# Patient Record
Sex: Female | Born: 1955
Health system: Southern US, Community
[De-identification: ages and names within clinical notes are randomized; demographics above are authoritative.]

## PROBLEM LIST (undated history)

## (undated) DIAGNOSIS — M1990 Unspecified osteoarthritis, unspecified site: Secondary | ICD-10-CM

## (undated) DIAGNOSIS — C799 Secondary malignant neoplasm of unspecified site: Secondary | ICD-10-CM

## (undated) DIAGNOSIS — F419 Anxiety disorder, unspecified: Secondary | ICD-10-CM

## (undated) DIAGNOSIS — C679 Malignant neoplasm of bladder, unspecified: Secondary | ICD-10-CM

## (undated) DIAGNOSIS — C801 Malignant (primary) neoplasm, unspecified: Secondary | ICD-10-CM

## (undated) DIAGNOSIS — S92911A Unspecified fracture of right toe(s), initial encounter for closed fracture: Secondary | ICD-10-CM

## (undated) DIAGNOSIS — Z973 Presence of spectacles and contact lenses: Secondary | ICD-10-CM

## (undated) DIAGNOSIS — T451X5A Adverse effect of antineoplastic and immunosuppressive drugs, initial encounter: Secondary | ICD-10-CM

## (undated) DIAGNOSIS — N183 Chronic kidney disease, stage 3 unspecified: Secondary | ICD-10-CM

## (undated) DIAGNOSIS — L989 Disorder of the skin and subcutaneous tissue, unspecified: Secondary | ICD-10-CM

## (undated) DIAGNOSIS — K5730 Diverticulosis of large intestine without perforation or abscess without bleeding: Secondary | ICD-10-CM

## (undated) DIAGNOSIS — Z8489 Family history of other specified conditions: Secondary | ICD-10-CM

## (undated) DIAGNOSIS — J302 Other seasonal allergic rhinitis: Secondary | ICD-10-CM

## (undated) DIAGNOSIS — E559 Vitamin D deficiency, unspecified: Secondary | ICD-10-CM

## (undated) DIAGNOSIS — Z8679 Personal history of other diseases of the circulatory system: Secondary | ICD-10-CM

## (undated) DIAGNOSIS — F329 Major depressive disorder, single episode, unspecified: Secondary | ICD-10-CM

## (undated) DIAGNOSIS — E785 Hyperlipidemia, unspecified: Secondary | ICD-10-CM

## (undated) DIAGNOSIS — R530 Neoplastic (malignant) related fatigue: Secondary | ICD-10-CM

## (undated) DIAGNOSIS — F1020 Alcohol dependence, uncomplicated: Secondary | ICD-10-CM

## (undated) DIAGNOSIS — Z85828 Personal history of other malignant neoplasm of skin: Secondary | ICD-10-CM

## (undated) DIAGNOSIS — Z86010 Personal history of colonic polyps: Secondary | ICD-10-CM

## (undated) DIAGNOSIS — N1330 Unspecified hydronephrosis: Secondary | ICD-10-CM

## (undated) DIAGNOSIS — I10 Essential (primary) hypertension: Secondary | ICD-10-CM

## (undated) DIAGNOSIS — Z5111 Encounter for antineoplastic chemotherapy: Secondary | ICD-10-CM

## (undated) DIAGNOSIS — K219 Gastro-esophageal reflux disease without esophagitis: Secondary | ICD-10-CM

## (undated) DIAGNOSIS — C4490 Unspecified malignant neoplasm of skin, unspecified: Secondary | ICD-10-CM

## (undated) DIAGNOSIS — Z720 Tobacco use: Secondary | ICD-10-CM

## (undated) DIAGNOSIS — Z8619 Personal history of other infectious and parasitic diseases: Secondary | ICD-10-CM

## (undated) DIAGNOSIS — J189 Pneumonia, unspecified organism: Secondary | ICD-10-CM

## (undated) DIAGNOSIS — Z515 Encounter for palliative care: Secondary | ICD-10-CM

## (undated) DIAGNOSIS — Z87442 Personal history of urinary calculi: Secondary | ICD-10-CM

## (undated) DIAGNOSIS — C669 Malignant neoplasm of unspecified ureter: Secondary | ICD-10-CM

## (undated) DIAGNOSIS — Z9221 Personal history of antineoplastic chemotherapy: Secondary | ICD-10-CM

## (undated) DIAGNOSIS — F411 Generalized anxiety disorder: Secondary | ICD-10-CM

## (undated) DIAGNOSIS — D6481 Anemia due to antineoplastic chemotherapy: Secondary | ICD-10-CM

## (undated) DIAGNOSIS — Z860101 Personal history of adenomatous and serrated colon polyps: Secondary | ICD-10-CM

## (undated) HISTORY — DX: Major depressive disorder, single episode, unspecified: F32.9

## (undated) HISTORY — PX: TONSILLECTOMY: SUR1361

## (undated) HISTORY — DX: Alcohol dependence, uncomplicated: F10.20

## (undated) HISTORY — DX: Personal history of other diseases of the circulatory system: Z86.79

## (undated) HISTORY — DX: Essential (primary) hypertension: I10

## (undated) HISTORY — DX: Vitamin D deficiency, unspecified: E55.9

## (undated) HISTORY — PX: FINGER SURGERY: SHX640

## (undated) HISTORY — DX: Unspecified fracture of right toe(s), initial encounter for closed fracture: S92.911A

## (undated) HISTORY — DX: Personal history of other infectious and parasitic diseases: Z86.19

## (undated) HISTORY — DX: Hyperlipidemia, unspecified: E78.5

## (undated) HISTORY — PX: GYNECOLOGIC CRYOSURGERY: SHX857

## (undated) HISTORY — DX: Tobacco use: Z72.0

## (undated) HISTORY — DX: Unspecified malignant neoplasm of skin, unspecified: C44.90

---

## 2009-10-29 ENCOUNTER — Encounter: Admission: RE | Admit: 2009-10-29 | Discharge: 2009-10-29 | Payer: Self-pay | Admitting: Internal Medicine

## 2010-02-19 ENCOUNTER — Ambulatory Visit (HOSPITAL_COMMUNITY): Admission: RE | Admit: 2010-02-19 | Discharge: 2010-02-19 | Payer: Self-pay | Admitting: Gastroenterology

## 2012-04-29 LAB — HM HEPATITIS C SCREENING LAB: HM Hepatitis Screen: NEGATIVE

## 2012-06-30 DIAGNOSIS — Z8619 Personal history of other infectious and parasitic diseases: Secondary | ICD-10-CM

## 2012-06-30 DIAGNOSIS — Z8679 Personal history of other diseases of the circulatory system: Secondary | ICD-10-CM

## 2012-06-30 DIAGNOSIS — G4700 Insomnia, unspecified: Secondary | ICD-10-CM | POA: Insufficient documentation

## 2012-06-30 HISTORY — DX: Personal history of other infectious and parasitic diseases: Z86.19

## 2012-06-30 HISTORY — DX: Personal history of other diseases of the circulatory system: Z86.79

## 2013-02-07 LAB — CBC AND DIFFERENTIAL: WBC: 5.4 10^3/mL

## 2013-02-07 LAB — BASIC METABOLIC PANEL
Glucose: 87 mg/dL
Potassium: 4 mmol/L (ref 3.4–5.3)

## 2013-02-07 LAB — LIPID PANEL: Triglycerides: 97 mg/dL (ref 40–160)

## 2013-07-17 ENCOUNTER — Telehealth: Payer: Self-pay | Admitting: Family Medicine

## 2013-07-17 ENCOUNTER — Ambulatory Visit (INDEPENDENT_AMBULATORY_CARE_PROVIDER_SITE_OTHER): Payer: 59 | Admitting: Family Medicine

## 2013-07-17 ENCOUNTER — Encounter: Payer: Self-pay | Admitting: Family Medicine

## 2013-07-17 ENCOUNTER — Encounter: Payer: Self-pay | Admitting: *Deleted

## 2013-07-17 VITALS — BP 138/91 | HR 78 | Ht 66.0 in | Wt 157.0 lb

## 2013-07-17 DIAGNOSIS — Z8619 Personal history of other infectious and parasitic diseases: Secondary | ICD-10-CM

## 2013-07-17 DIAGNOSIS — E785 Hyperlipidemia, unspecified: Secondary | ICD-10-CM

## 2013-07-17 DIAGNOSIS — Z131 Encounter for screening for diabetes mellitus: Secondary | ICD-10-CM

## 2013-07-17 DIAGNOSIS — F1021 Alcohol dependence, in remission: Secondary | ICD-10-CM

## 2013-07-17 DIAGNOSIS — F418 Other specified anxiety disorders: Secondary | ICD-10-CM | POA: Insufficient documentation

## 2013-07-17 DIAGNOSIS — R030 Elevated blood-pressure reading, without diagnosis of hypertension: Secondary | ICD-10-CM

## 2013-07-17 DIAGNOSIS — E559 Vitamin D deficiency, unspecified: Secondary | ICD-10-CM | POA: Insufficient documentation

## 2013-07-17 DIAGNOSIS — IMO0001 Reserved for inherently not codable concepts without codable children: Secondary | ICD-10-CM

## 2013-07-17 DIAGNOSIS — M5416 Radiculopathy, lumbar region: Secondary | ICD-10-CM | POA: Insufficient documentation

## 2013-07-17 DIAGNOSIS — F329 Major depressive disorder, single episode, unspecified: Secondary | ICD-10-CM

## 2013-07-17 DIAGNOSIS — F32A Depression, unspecified: Secondary | ICD-10-CM

## 2013-07-17 HISTORY — DX: Alcohol dependence, in remission: F10.21

## 2013-07-17 HISTORY — DX: Hyperlipidemia, unspecified: E78.5

## 2013-07-17 HISTORY — DX: Personal history of other infectious and parasitic diseases: Z86.19

## 2013-07-17 HISTORY — DX: Vitamin D deficiency, unspecified: E55.9

## 2013-07-17 HISTORY — DX: Depression, unspecified: F32.A

## 2013-07-17 NOTE — Progress Notes (Signed)
CC: Mary Stark is a 57 y.o. 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 Symptoms significantly improved once having Abilify to Wellbutrin. She reports mild subjective depression but does not get in the way of her quality of life nothing seems to make it better or worse she tells me she is quite happy with her psychiatric state currently. She has been dealing with insomnia on a nightly basis ever since starting third shift this is been helped with using a single Klonopin every evening she rarely if ever uses them otherwise.    History hyperlipidemia has been on Zocor for at least a year now last cholesterol panel was in February without abnormalities. She denies right upper quadrant pain, myalgias nor skin or scleral discoloration. She's particular about what she eats and eats quite healthy, no formal exercise routine  History of hepatitis C early 2000 approximately one year ago RNA was undetectable she is under the impression that she is no longer needing to have RNA levels checked.   She has a history of requiring inpatient treatment for alcoholism a little over a year ago since then she reports she drinks no more than 2 drinks every day and no more than 14 in a week.    Review Of Systems Outlined In HPI  Past Medical History  Diagnosis Date  . Depression 07/17/2013  . History of hepatitis C 07/17/2013  . Hyperlipidemia 07/17/2013  . Vitamin D deficiency 07/17/2013     History reviewed. No pertinent family history.   History  Substance Use Topics  . Smoking status: Not on file  . Smokeless tobacco: Not on file  . Alcohol Use: Not on file     Objective: Filed Vitals:   07/17/13 1424  BP: 138/91  Pulse: 78    General: Alert and  Oriented, No Acute Distress HEENT: Pupils equal, round, reactive to light. Conjunctivae clear.  External ears unremarkable, canals clear with intact TMs with appropriate landmarks.  Middle ear appears open without effusion. Pink inferior turbinates.  Moist mucous membranes, pharynx without inflammation nor lesions.  Neck supple without palpable lymphadenopathy nor abnormal masses. Lungs: Clear to auscultation bilaterally, no wheezing/ronchi/rales.  Comfortable work of breathing. Good air movement. Cardiac: Regular rate and rhythm. Normal S1/S2.  No murmurs, rubs, nor gallops.   Abdomen: Normal bowel sounds, soft and non tender without palpable masses. Extremities: No peripheral edema.  Strong peripheral pulses.  Mental Status: No depression, anxiety, nor agitation. Skin: Warm and dry.  Assessment & Plan: Mary Stark was seen today for establish care.  Diagnoses and associated orders for this visit:  Hyperlipidemia - Lipid panel  Depression  History of hepatitis C - COMPLETE METABOLIC PANEL WITH GFR  Diabetes mellitus screening - COMPLETE METABOLIC PANEL WITH GFR  Elevated blood pressure  History of alcohol dependence  Other Orders - ARIPiprazole (ABILIFY) 10 MG tablet; Take 10 mg by mouth daily. Take 1/2 a tablet daily - simvastatin (ZOCOR) 20 MG tablet; Take 20 mg by mouth every evening. - clonazePAM (KLONOPIN) 0.5 MG tablet; Take 0.5 mg by mouth. Take 1/2 tablet three times a day - BUPROPION HCL PO; Take 150 mg by mouth. Take three tablets by mouth once a day    Hyperlipidemia: Clinically controlled she is  due for fasting cholesterol we will also check liver enzymes and statin use and history of hepatitis C History of alcohol dependence: Controlled, I've encouraged her to attempt to stop alcohol altogether to the potential of relapse Depression: Chronic controlled condition continue Abilify Wellbutrin History of hepatitis C: Controlled Elevated blood pressure: Discussed diet and  exercise interventions to minimize the need for antihypertensive medications, will discuss medication options if remains elevated at followup   Return in about 3 months (around 10/17/2013).

## 2013-07-17 NOTE — Telephone Encounter (Signed)
Sue Lush, Can you please see if [facility] in  can send [place] a colonoscopy report for [facility] sometime within the last 3-5 years.

## 2013-07-18 ENCOUNTER — Encounter: Payer: Self-pay | Admitting: Family Medicine

## 2013-07-18 DIAGNOSIS — Z860100 Personal history of colon polyps, unspecified: Secondary | ICD-10-CM

## 2013-07-18 HISTORY — DX: Personal history of colon polyps, unspecified: Z86.0100

## 2013-07-18 NOTE — Telephone Encounter (Signed)
They are faxing the report

## 2013-08-02 ENCOUNTER — Encounter: Payer: Self-pay | Admitting: *Deleted

## 2013-08-31 LAB — COMPLETE METABOLIC PANEL WITH GFR
ALT: 12 U/L (ref 0–35)
CO2: 27 mEq/L (ref 19–32)
GFR, Est African American: 84 mL/min
Potassium: 4 mEq/L (ref 3.5–5.3)
Sodium: 136 mEq/L (ref 135–145)
Total Bilirubin: 0.4 mg/dL (ref 0.3–1.2)
Total Protein: 7 g/dL (ref 6.0–8.3)

## 2013-08-31 LAB — LIPID PANEL
HDL: 62 mg/dL (ref >39)
LDL Cholesterol: 78 mg/dL (ref 0–99)

## 2013-09-12 ENCOUNTER — Encounter: Payer: Self-pay | Admitting: Obstetrics & Gynecology

## 2013-09-12 ENCOUNTER — Ambulatory Visit (INDEPENDENT_AMBULATORY_CARE_PROVIDER_SITE_OTHER): Payer: 59 | Admitting: Obstetrics & Gynecology

## 2013-09-12 ENCOUNTER — Ambulatory Visit: Payer: 59 | Admitting: Family Medicine

## 2013-09-12 VITALS — BP 132/91 | HR 97 | Resp 16 | Ht 66.0 in | Wt 144.0 lb

## 2013-09-12 DIAGNOSIS — Z124 Encounter for screening for malignant neoplasm of cervix: Secondary | ICD-10-CM

## 2013-09-12 DIAGNOSIS — Z113 Encounter for screening for infections with a predominantly sexual mode of transmission: Secondary | ICD-10-CM

## 2013-09-12 DIAGNOSIS — N952 Postmenopausal atrophic vaginitis: Secondary | ICD-10-CM

## 2013-09-12 DIAGNOSIS — Z1151 Encounter for screening for human papillomavirus (HPV): Secondary | ICD-10-CM

## 2013-09-12 DIAGNOSIS — Z01419 Encounter for gynecological examination (general) (routine) without abnormal findings: Secondary | ICD-10-CM

## 2013-09-12 MED ORDER — ESTRADIOL 10 MCG VA TABS: ORAL_TABLET | VAGINAL | Status: DC

## 2013-09-12 NOTE — Progress Notes (Signed)
  Subjective:    Mary Stark is a 57 y.o. female who presents for an annual exam. The patient has complaints of vaginal dryness. The patient is sexually active. GYN screening history: last pap: was normal. The patient wears seatbelts: yes. The patient participates in regular exercise: yes. Has the patient ever been transfused or tattooed?: not asked but pt has been treated for Hep C. The patient reports that there is not domestic violence in her life.   Menstrual History: OB History   Grav Para Term Preterm Abortions TAB SAB Ect Mult Living                 No LMP recorded. Patient is postmenopausal.    The following portions of the patient's history were reviewed and updated as appropriate: allergies, current medications, past family history, past medical history, past social history, past surgical history and problem list.  Review of Systems Pertinent items are noted in HPI.    Objective:      Filed Vitals:   09/12/13 1404  BP: 132/91  Pulse: 97  Resp: 16  Height: 5\' 6"  (1.676 m)  Weight: 144 lb (65.318 kg)   Vitals:  WNL General appearance: alert, cooperative and no distress Head: Normocephalic, without obvious abnormality, atraumatic Eyes: negative Throat: lips, mucosa, and tongue normal; teeth and gums normal Lungs: clear to auscultation bilaterally Breasts: normal appearance, no masses or tenderness, No nipple retraction or dimpling, No nipple discharge or bleeding Heart: regular rate and rhythm Abdomen: soft, non-tender; bowel sounds normal; no masses,  no organomegaly Pelvic: cervix normal in appearance, external genitalia normal, no adnexal masses or tenderness, no bladder tenderness, no cervical motion tenderness, perianal skin: no external genital warts noted, rectovaginal septum normal, urethra without abnormality or discharge, uterus normal size, shape, and consistency and vagina is atrophic with more discharge than expected. Extremities: no edema, redness or  tenderness in the calves or thighs Skin: no lesions or rash Lymph nodes: Axillary adenopathy: none     .    Assessment:    Healthy female exam.  Atrophic vaginitis   Plan:     All questions answered. Breast self exam technique reviewed and patient encouraged to perform self-exam monthly. Chlamydia specimen. Follow up in 1 year.  Bd affirm and gonorrhea mammogram Bone density BRCA testing Vagifem for atrophic vaginitis.  Pt explained very small risk of hyperplasia with vaginal estrogen however, recent studies have shown no increase in estrogen levels in women who take vagifem over other postmenopausal women.

## 2013-09-13 ENCOUNTER — Encounter: Payer: Self-pay | Admitting: Family Medicine

## 2013-09-13 ENCOUNTER — Ambulatory Visit (INDEPENDENT_AMBULATORY_CARE_PROVIDER_SITE_OTHER): Payer: 59 | Admitting: Family Medicine

## 2013-09-13 VITALS — BP 123/88 | HR 88 | Wt 145.0 lb

## 2013-09-13 DIAGNOSIS — M5430 Sciatica, unspecified side: Secondary | ICD-10-CM

## 2013-09-13 DIAGNOSIS — M549 Dorsalgia, unspecified: Secondary | ICD-10-CM

## 2013-09-13 DIAGNOSIS — M5431 Sciatica, right side: Secondary | ICD-10-CM

## 2013-09-13 DIAGNOSIS — G4700 Insomnia, unspecified: Secondary | ICD-10-CM

## 2013-09-13 MED ORDER — PREDNISONE 20 MG PO TABS: ORAL_TABLET | ORAL | Status: AC

## 2013-09-13 MED ORDER — CLONAZEPAM 0.5 MG PO TABS: ORAL_TABLET | ORAL | Status: DC

## 2013-09-13 NOTE — Progress Notes (Signed)
CC: Mary Stark is a 57 y.o. female is here for Back Pain   Subjective: HPI:  Patient complains of back pain that has been present for years that has been worsening over the past 2 months. It is described as a piece of glass stuck in her back in the midline. 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 It is significantly improved on the days that she is not working. It is worse at the end of the day moderate severity on the days that she is working. It is nonradiating she denies recent over exertion or trauma. She denies bowel or bladder incontinence, saddle paresthesia, nor radiation of pain. Denies limb weakness. Pain is completely resolved with forward flexion worse with extension. She's been taking Tylenol and ibuprofen without much benefit occasionally locks without benefit  Over the past month she has had a burning sensation moderate severity traveling down the back of her right leg from the buttock into the foot it is worse and present only when driving long distances is never encountered any other situation. She denies sensory disturbances in the extremities. Nothing particularly makes this better or worse other than above   Review Of Systems Outlined In HPI  Past Medical History  Diagnosis Date  . Depression 07/17/2013  . History of hepatitis C 07/17/2013  . Hyperlipidemia 07/17/2013  . Vitamin D deficiency 07/17/2013     Family History  Problem Relation Age of Onset  . Ovarian cancer Mother      History  Substance Use Topics  . Smoking status: Former Smoker    Quit date: 12/17/2012  . Smokeless tobacco: Not on file  . Alcohol Use: 3.0 oz/week    6 drink(s) per week     Objective: Filed Vitals:   09/13/13 1543  BP: 123/88  Pulse: 88    General: Alert and Oriented, No Acute Distress HEENT: Pupils equal, round, reactive to light. Conjunctivae clear.  Moist mucous membranes Lungs: Clear comfortable work of  breathing Cardiac: Regular rate and rhythm.  Back: Pain is reproduced with palpation of L3 spinous process there is no paraspinal musculature pain to palpation. Pain is reproduced with extension absent with flexion. Negative stork test. Extremities: No peripheral edema.  Strong peripheral pulses.  Right leg: Straight leg raise on the right is positive, FABER/FADIR negative, long roll negative, femoral grind is negative she has full range of motion strength in both legs with L4 and S1 DTRs two over four bilaterally Mental Status: No depression, anxiety, nor agitation. Skin: Warm and dry.  Assessment & Plan: Mary Stark was seen today for back pain.  Diagnoses and associated orders for this visit:  Back pain - predniSONE (DELTASONE) 20 MG tablet; Three tabs daily days 1-3, two tabs daily days 4-6, one tab daily days 7-9, half tab daily days 10-13.  Insomnia - clonazePAM (KLONOPIN) 0.5 MG tablet; Half to full tablet by mouth as needed for sleep.  Sciatica, right     discussed with patient I believe her back pain is coming from arthritic facet joints low suspicion for nerve impingement from disc disease. Will start with prednisone taper if not improved will then get imaging as she is open to the idea of injections if warranted Discussed that her right leg pain is most likely due to sciatica given handout on stretching and rehabilitation for the next 3 weeks on a daily basis She is requesting refills on clonazepam for insomnia which is working  well denies any alcohol, nor recreational drug abuse.  Return if symptoms worsen or fail to improve.

## 2013-09-17 ENCOUNTER — Other Ambulatory Visit: Payer: Self-pay | Admitting: Obstetrics & Gynecology

## 2013-09-17 MED ORDER — METRONIDAZOLE 500 MG PO TABS
500.0000 mg | ORAL_TABLET | Freq: Two times a day (BID) | ORAL | Status: DC
Start: 2013-09-17 — End: 2013-10-21

## 2013-09-18 ENCOUNTER — Telehealth: Payer: Self-pay | Admitting: *Deleted

## 2013-09-18 NOTE — Telephone Encounter (Signed)
LM on cell phone with results and that she did have BV and that Dr Penne Lash sent a RX to [facility]

## 2013-09-18 NOTE — Telephone Encounter (Signed)
Message copied by Granville Lewis on Mon Sep 18, 2013 11:37 AM ------      Message from: Lesly Dukes      Created: Sun Sep 17, 2013  9:18 AM       BV on bd affirm.  Rx for Flagyl sent to pharmacy.  RN to call. ------

## 2013-09-25 ENCOUNTER — Telehealth: Payer: Self-pay | Admitting: *Deleted

## 2013-09-25 NOTE — Telephone Encounter (Signed)
Copy of BRCA results which are normal mailed to pt's home address.

## 2013-09-27 ENCOUNTER — Encounter: Payer: Self-pay | Admitting: Obstetrics & Gynecology

## 2013-09-27 DIAGNOSIS — Z8041 Family history of malignant neoplasm of ovary: Secondary | ICD-10-CM | POA: Insufficient documentation

## 2013-09-27 HISTORY — DX: Family history of malignant neoplasm of ovary: Z80.41

## 2013-10-10 ENCOUNTER — Ambulatory Visit (INDEPENDENT_AMBULATORY_CARE_PROVIDER_SITE_OTHER): Payer: 59

## 2013-10-10 DIAGNOSIS — Z1231 Encounter for screening mammogram for malignant neoplasm of breast: Secondary | ICD-10-CM

## 2013-10-10 DIAGNOSIS — Z01419 Encounter for gynecological examination (general) (routine) without abnormal findings: Secondary | ICD-10-CM

## 2013-10-10 DIAGNOSIS — M899 Disorder of bone, unspecified: Secondary | ICD-10-CM

## 2013-10-19 ENCOUNTER — Other Ambulatory Visit: Payer: Self-pay

## 2013-10-21 ENCOUNTER — Emergency Department
Admission: EM | Admit: 2013-10-21 | Discharge: 2013-10-21 | Disposition: A | Discharge status: Home / Self Care | Payer: 59 | Source: Home / Self Care | Attending: Family Medicine | Admitting: Family Medicine

## 2013-10-21 ENCOUNTER — Emergency Department (INDEPENDENT_AMBULATORY_CARE_PROVIDER_SITE_OTHER): Payer: 59

## 2013-10-21 ENCOUNTER — Encounter: Payer: Self-pay | Admitting: Emergency Medicine

## 2013-10-21 DIAGNOSIS — M546 Pain in thoracic spine: Secondary | ICD-10-CM

## 2013-10-21 DIAGNOSIS — M47814 Spondylosis without myelopathy or radiculopathy, thoracic region: Secondary | ICD-10-CM

## 2013-10-21 DIAGNOSIS — M47817 Spondylosis without myelopathy or radiculopathy, lumbosacral region: Secondary | ICD-10-CM

## 2013-10-21 MED ORDER — DICLOFENAC EPOLAMINE 1.3 % TD PTCH: MEDICATED_PATCH | TRANSDERMAL | Status: DC

## 2013-10-21 MED ORDER — TRAMADOL HCL 50 MG PO TABS: 50.0000 mg | ORAL_TABLET | Freq: Four times a day (QID) | ORAL | Status: DC | PRN

## 2013-10-21 NOTE — ED Notes (Signed)
Patient complains of back pain lasting since last Thursday. Patient has a 56 yr old grandson she lifts and plays with. Pain score is a 5/10.

## 2013-10-21 NOTE — ED Provider Notes (Signed)
CSN: [id number]     Arrival date & time 10/21/13  1425 History   First MD Initiated Contact with Patient 10/21/13 1455     Chief Complaint  Patient presents with  . Back Pain      HPI Comments: Patient reports that her previous episode of low back pain treated one month ago with prednisone has resolved.  Her right side radicular pain has also resolved.  After lifting and playing with her 56 y/o grandson two days ago she has developed lower thoracic midline pain.  This pain is improved when bending forward and worse when standing.  She has not had improvement with BC Powder, Flexeril makes her drowsy.  Patient is a 57 y.o. female presenting with back pain. The history is provided by the patient.  Back Pain Location:  Thoracic spine Quality:  Burning and shooting Radiates to:  Does not radiate Pain severity:  Moderate Pain is:  Worse during the day Onset quality:  Sudden Duration:  2 days Timing:  Intermittent Progression:  Unchanged Chronicity:  New Context comment:  Lifting Relieved by:  Nothing Worsened by:  Standing Ineffective treatments: BC Powder. Associated symptoms: no abdominal pain, no bladder incontinence, no bowel incontinence, no chest pain, no dysuria, no fever, no headaches, no leg pain, no numbness, no paresthesias, no pelvic pain, no perianal numbness, no tingling and no weakness     Past Medical History  Diagnosis Date  . Depression 07/17/2013  . History of hepatitis C 07/17/2013  . Hyperlipidemia 07/17/2013  . Vitamin D deficiency 07/17/2013   Past Surgical History  Procedure Laterality Date  . Cesarean section  1984  . Tonsillectomy     Family History  Problem Relation Age of Onset  . Ovarian cancer Mother   . Bronchitis Father   . Cardiomyopathy Father    History  Substance Use Topics  . Smoking status: Current Every Day Smoker    Last Attempt to Quit: 12/17/2012  . Smokeless tobacco: Not on file  . Alcohol Use: 3.0 oz/week    6 drink(s) per week   OB  History   Grav Para Term Preterm Abortions TAB SAB Ect Mult Living                 Review of Systems  Constitutional: Negative for fever.  Cardiovascular: Negative for chest pain.  Gastrointestinal: Negative for abdominal pain and bowel incontinence.  Genitourinary: Negative for bladder incontinence, dysuria and pelvic pain.  Musculoskeletal: Positive for back pain.  Neurological: Negative for tingling, weakness, numbness, headaches and paresthesias.  All other systems reviewed and are negative.    Allergies  Penicillins  Home Medications   Current Outpatient Rx  Name  Route  Sig  Dispense  Refill  . ARIPiprazole (ABILIFY) 5 MG tablet      5 mg. Take 1 tablet (5 mg total) by mouth daily.         Marland Kitchen buPROPion (WELLBUTRIN XL) 150 MG 24 hr tablet      TAKE 3 TABLETS BY MOUTH ONCE DAILY         . Calcium Carbonate-Vitamin D (CALCIUM 600+D) 600-400 MG-UNIT per tablet   Oral   Take 1 tablet by mouth 2 (two) times daily.         . clonazePAM (KLONOPIN) 0.5 MG tablet      Half to full tablet by mouth as needed for sleep.   30 tablet   1   . Coenzyme Q10 (CO Q 10 PO)  Oral   Take by mouth.         . diphenhydrAMINE (BENADRYL) 25 MG tablet   Oral   Take 25 mg by mouth every 6 (six) hours as needed for itching.         . docusate sodium (COLACE) 250 MG capsule   Oral   Take 250 mg by mouth daily.         Marland Kitchen GLUCOSAMINE HCL PO   Oral   Take by mouth.         . Omega-3 Fatty Acids (FISH OIL) 1200 MG CAPS   Oral   Take by mouth.         . simvastatin (ZOCOR) 20 MG tablet      TAKE 1 TABLET BY MOUTH DAILY         . diclofenac (FLECTOR) 1.3 % PTCH      Apply to back once daily; take off after 12 hours   30 patch   0   . Estradiol 10 MCG TABS vaginal tablet      Insert one tablet per vagina nightly for 2 weeks then twice a week for maintenance   18 tablet   1   . traMADol (ULTRAM) 50 MG tablet   Oral   Take 1 tablet (50 mg total) by mouth  every 6 (six) hours as needed.   20 tablet   0    BP 147/91  Pulse 109  Temp(Src) 97.9 F (36.6 C) (Oral)  Ht 5' 6.25" (1.683 m)  Wt 152 lb (68.947 kg)  BMI 24.34 kg/m2  SpO2 98% Physical Exam  Nursing note and vitals reviewed. Constitutional: She is oriented to person, place, and time. She appears well-developed and well-nourished. No distress.  HENT:  Head: Normocephalic.  Eyes: Conjunctivae are normal. Pupils are equal, round, and reactive to light.  Cardiovascular: Normal heart sounds.   Pulmonary/Chest: Breath sounds normal.  Abdominal: She exhibits no distension. There is no tenderness.  Musculoskeletal:       Thoracic back: She exhibits tenderness and bony tenderness. She exhibits no swelling.       Back:  There is tenderness over the lower thoracic spinous processes as noted on diagram. Patient can heel/toe walk and squat without difficulty. Good forward flexion.  Straight leg raising test is negative.  Sitting knee extension test is negative.  Strength and sensation in the lower extremities is normal.  Patellar and achilles reflexes are normal   Neurological: She is alert and oriented to person, place, and time.  Skin: Skin is warm and dry. No rash noted.    ED Course  Procedures  none    Imaging Review Dg Thoracic Spine 2 View  10/21/2013   CLINICAL DATA:  Mid back pain  EXAM: THORACIC SPINE - 2 VIEW  COMPARISON:  None.  FINDINGS: Normal thoracic kyphosis.  No evidence of fracture dislocation. Vertebral body heights and intervertebral disc spaces are maintained.  Very mild degenerative changes.  Visualized lungs are clear.  IMPRESSION: Mild degenerative changes.   Electronically Signed   By: Charline Bills M.D.   On: 10/21/2013 15:45   Dg Lumbar Spine Complete  10/21/2013   CLINICAL DATA:  Back pain for 2 days, no known injury  EXAM: LUMBAR SPINE - COMPLETE 4+ VIEW  COMPARISON:  None.  FINDINGS: Five views of lumbar spine submitted. No acute fracture or  subluxation. Alignment and vertebral heights are preserved. There is disc space flattening with mild anterior spurring at L4-L5 and  L5-S1 level. Facet degenerative changes noted L4 and L5 level.  IMPRESSION: No acute fracture or subluxation. Degenerative changes as described above.   Electronically Signed   By: Natasha Mead M.D.   On: 10/21/2013 15:49      MDM   1. Acute thoracic back pain    Begin trial of Flector patch for 12 hours daily.  Rx for Tramadol.  Continue Flexeril at bedtime. Apply ice pack for 30 minutes, 2 or 3 times daily. Followup with [facility] if not improving about two weeks.    Lattie Haw, MD 10/21/13 [phone]

## 2013-10-24 ENCOUNTER — Ambulatory Visit (INDEPENDENT_AMBULATORY_CARE_PROVIDER_SITE_OTHER): Payer: 59 | Admitting: Family Medicine

## 2013-10-24 ENCOUNTER — Encounter: Payer: Self-pay | Admitting: Family Medicine

## 2013-10-24 VITALS — BP 143/91 | HR 98 | Wt 154.0 lb

## 2013-10-24 DIAGNOSIS — M549 Dorsalgia, unspecified: Secondary | ICD-10-CM

## 2013-10-24 MED ORDER — DICLOFENAC SODIUM 1 % TD GEL: TRANSDERMAL | Status: DC

## 2013-10-24 NOTE — Progress Notes (Signed)
CC: Mary Stark is a 57 y.o. female is here for Back Pain   Subjective: HPI:  Complains of back pain has been present since Thursday slightly different than her chronic back pain for the past 2 years. Current back pain is described as a sharpness in the midline of the back in the upper lumbar region but is nonradiating. This is slightly been getting better since using Flector patches. She reports that the prednisone taper significantly improved her lower back pain with radiation into the right leg. Unfortunately this did return approximately 2 weeks ago now moderate in severity seated for long periods of time makes the pain worse, standing for longer than an hour also makes the pain worse. No other position can reproduce the pain. She reports shocklike sensations that radiate down the back of both legs more so on the right there has also been slowly been worsening over the past 2 years. She denies weakness or any other motor or sensory disturbances and lower extremities. Denies saddle paresthesia or bowel or bladder incontinence.  Denies abdominal pain, pelvic pain, skin changes  Review Of Systems Outlined In HPI  Past Medical History  Diagnosis Date  . Depression 07/17/2013  . History of hepatitis C 07/17/2013  . Hyperlipidemia 07/17/2013  . Vitamin D deficiency 07/17/2013     Family History  Problem Relation Age of Onset  . Ovarian cancer Mother   . Bronchitis Father   . Cardiomyopathy Father      History  Substance Use Topics  . Smoking status: Current Every Day Smoker    Last Attempt to Quit: 12/17/2012  . Smokeless tobacco: Not on file  . Alcohol Use: 3.0 oz/week    6 drink(s) per week     Objective: Filed Vitals:   10/24/13 1609  BP: 143/91  Pulse: 98    General: Alert and Oriented, No Acute Distress HEENT: Pupils equal, round, reactive to light. Conjunctivae clear.  Moist mucous membranes pharynx unremarkable Lungs: clear comfortable work of breathing Cardiac: Regular  rate and rhythm Back: No midline spinous process tenderness to palpation, no paraspinal muscular tenderness to palpation in the lumbar region. Full range of motion in the lumbar region Extremities: No peripheral edema.  Strong peripheral pulses. Full range of motion strength in both lower extremities L4 and S1 DTRs two over four bilaterally, she has mildly decreased light touch sensation in the L4 and L5 dermatome bilaterally. Mental Status: No depression, anxiety, nor agitation. Skin: Warm and dry.  Assessment & Plan: Mary Stark was seen today for back pain.  Diagnoses and associated orders for this visit:  Back pain - diclofenac sodium (VOLTAREN) 1 % GEL; Apply film to location of back pain three times a day.    Changing from Flector to diclofenac gel due to cost. She has degenerative changes in her lumbar spine most notably disc space narrowing and L4-L5 and also L5-S1. Given that his pain is been present for 2 years and encouraged her to consider physical therapy and or MRI to evaluate for candidacy of facet joint or epidural injections. She would prefer to start with a chiropractor visit/evaluation and notify me how and if she wants to proceed depending on that visit.  She's going to drop off intermittent FMLA paperwork  Return if symptoms worsen or fail to improve.

## 2013-10-30 ENCOUNTER — Telehealth: Payer: Self-pay | Admitting: *Deleted

## 2013-10-30 NOTE — Telephone Encounter (Signed)
Message copied by Granville Lewis on Mon Oct 30, 2013  9:21 AM ------      Message from: Lesly Dukes      Created: Mon Oct 30, 2013  9:02 AM       ASSESSMENT:  Patient's diagnostic category is LOW BONE MASS by [facility]      Criteria.            FRACTURE RISK: MODERATE            FRAX: Based on the World Health Organization FRAX model, the 10      year probability of a major osteoporotic fracture is 6.9%.  The 10      year probability of a hip fracture is 0.6%.            L4 is excluded due to a greater than one standard deviation      difference in T-score from adjacent vertebral bodies.            Comparison: None.            RECOMMENDATIONS:            All patients should ensure an adequate intake of dietary calcium      (1200mg  daily) and vitamin D (800 IU daily) unless contraindicated.      The National Osteoporosis Foundation recommends that FDA-approved      medical therapies be considered in postmenopausal women and mean      age 69 or older with a:            1)    Hip or vertebral (clinical or morphometric) fracture.            2)   T-score of -2.5 or lower at the spine or hip.            3)   Ten-year fracture probability by FRAX of 3% or greater for hip      fracture or 20% or greater for major osteoporotic fracture.            FOLLOW-UP:            People with diagnosed cases of osteoporosis or at high risk for      fracture should have regular bone mineral density tests.  For      patients eligible for Medicare, routine testing is allowed once      every 2 years.  The testing frequency can be increased to one year      for patients who have rapidly progressing disease, those who are      receiving or discontinuing medical therapy to restore bone mass, or      have additional risk factors.       ------

## 2013-10-30 NOTE — Telephone Encounter (Signed)
Copy of BMD and calcium recommendations mailed to pt's home address.

## 2013-11-08 ENCOUNTER — Encounter: Payer: Self-pay | Admitting: Obstetrics & Gynecology

## 2013-11-08 DIAGNOSIS — N951 Menopausal and female climacteric states: Secondary | ICD-10-CM | POA: Insufficient documentation

## 2013-11-17 ENCOUNTER — Other Ambulatory Visit: Payer: Self-pay | Admitting: *Deleted

## 2013-11-17 DIAGNOSIS — G4700 Insomnia, unspecified: Secondary | ICD-10-CM

## 2013-11-17 MED ORDER — CLONAZEPAM 0.5 MG PO TABS: ORAL_TABLET | ORAL | Status: DC

## 2013-11-25 ENCOUNTER — Encounter: Payer: Self-pay | Admitting: Family Medicine

## 2013-12-24 ENCOUNTER — Encounter: Payer: Self-pay | Admitting: Emergency Medicine

## 2013-12-24 ENCOUNTER — Emergency Department
Admission: EM | Admit: 2013-12-24 | Discharge: 2013-12-24 | Disposition: A | Discharge status: Home / Self Care | Payer: 59 | Source: Home / Self Care | Attending: Emergency Medicine | Admitting: Emergency Medicine

## 2013-12-24 DIAGNOSIS — J019 Acute sinusitis, unspecified: Secondary | ICD-10-CM

## 2013-12-24 MED ORDER — CLARITHROMYCIN 500 MG PO TABS: 500.0000 mg | ORAL_TABLET | Freq: Two times a day (BID) | ORAL | Status: DC

## 2013-12-24 MED ORDER — GUAIFENESIN-CODEINE 100-10 MG/5ML PO SYRP: 5.0000 mL | ORAL_SOLUTION | Freq: Four times a day (QID) | ORAL | Status: DC | PRN

## 2013-12-24 MED ORDER — METHYLPREDNISOLONE SODIUM SUCC 125 MG IJ SOLR
125.0000 mg | Freq: Once | INTRAMUSCULAR | Status: AC
  Administered 2013-12-24: 125 mg via INTRAMUSCULAR

## 2013-12-24 NOTE — ED Notes (Signed)
Patient c/o congestion, ear pain, sore throat and jaw pain x 5 days. Patient states she has tried OTC Sudafed, Claritin, Allegra, and Normal saline without relief.

## 2013-12-24 NOTE — ED Provider Notes (Signed)
CSN: [id number]     Arrival date & time 12/24/13  1241 History   First MD Initiated Contact with Patient 12/24/13 1255     Chief Complaint  Patient presents with  . Sinusitis   (Consider location/radiation/quality/duration/timing/severity/associated sxs/prior Treatment) HPI Mary Stark is a 58 y.o. female who complains of onset of cold symptoms for 7 days.  The symptoms are constant and mild-moderate in severity.  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 + cough No pleuritic pain No wheezing + nasal congestion + post-nasal drainage + sinus pain/pressure +/- chest congestion No itchy/red eyes No earache No hemoptysis No SOB + chills/sweats No nausea No vomiting No abdominal pain No diarrhea No skin rashes + fatigue + myalgias + headache    Past Medical History  Diagnosis Date  . Depression 07/17/2013  . History of hepatitis C 07/17/2013  . Hyperlipidemia 07/17/2013  . Vitamin D deficiency 07/17/2013   Past Surgical History  Procedure Laterality Date  . Cesarean section  1984  . Tonsillectomy     Family History  Problem Relation Age of Onset  . Ovarian cancer Mother   . Bronchitis Father   . Cardiomyopathy Father    History  Substance Use Topics  . Smoking status: Current Every Day Smoker    Last Attempt to Quit: 12/17/2012  . Smokeless tobacco: Not on file  . Alcohol Use: 3.0 oz/week    6 drink(s) per week   OB History   Grav Para Term Preterm Abortions TAB SAB Ect Mult Living                 Review of Systems  All other systems reviewed and are negative.    Allergies  Penicillins  Home Medications   Current Outpatient Rx  Name  Route  Sig  Dispense  Refill  . ARIPiprazole (ABILIFY) 5 MG tablet      5 mg. Take 1 tablet (5 mg total) by mouth daily.         Marland Kitchen buPROPion (WELLBUTRIN XL) 150 MG 24 hr tablet      TAKE 3 TABLETS BY MOUTH ONCE DAILY         . Calcium Carbonate-Vitamin D (CALCIUM 600+D) 600-400 MG-UNIT per tablet   Oral   Take 1 tablet by mouth 2  (two) times daily.         . clarithromycin (BIAXIN) 500 MG tablet   Oral   Take 1 tablet (500 mg total) by mouth 2 (two) times daily.   20 tablet   0   . clonazePAM (KLONOPIN) 0.5 MG tablet      Half to full tablet by mouth as needed for sleep.   30 tablet   1   . Coenzyme Q10 (CO Q 10 PO)   Oral   Take by mouth.         . diclofenac (FLECTOR) 1.3 % PTCH      Apply to back once daily; take off after 12 hours   30 patch   0   . diclofenac sodium (VOLTAREN) 1 % GEL      Apply film to location of back pain three times a day.   100 g   1   . diphenhydrAMINE (BENADRYL) 25 MG tablet   Oral   Take 25 mg by mouth every 6 (six) hours as needed for itching.         . docusate sodium (COLACE) 250 MG capsule   Oral   Take 250 mg by mouth  daily.         . Estradiol 10 MCG TABS vaginal tablet      Insert one tablet per vagina nightly for 2 weeks then twice a week for maintenance   18 tablet   1   . GLUCOSAMINE HCL PO   Oral   Take by mouth.         Marland Kitchen guaiFENesin-codeine (ROBITUSSIN AC) 100-10 MG/5ML syrup   Oral   Take 5 mLs by mouth 4 (four) times daily as needed for cough or congestion.   120 mL   0   . Omega-3 Fatty Acids (FISH OIL) 1200 MG CAPS   Oral   Take by mouth.         . simvastatin (ZOCOR) 20 MG tablet      TAKE 1 TABLET BY MOUTH DAILY         . traMADol (ULTRAM) 50 MG tablet   Oral   Take 1 tablet (50 mg total) by mouth every 6 (six) hours as needed.   20 tablet   0    BP 131/89  Pulse 84  Temp(Src) 97.4 F (36.3 C) (Oral)  Resp 16  Ht 5\' 6"  (1.676 m)  Wt 162 lb 4 oz (73.596 kg)  BMI 26.20 kg/m2  SpO2 98% Physical Exam  Nursing note and vitals reviewed. Constitutional: She is oriented to person, place, and time. She appears well-developed and well-nourished.  HENT:  Head: Normocephalic and atraumatic.  Right Ear: Tympanic membrane, external ear and ear canal normal.  Left Ear: Tympanic membrane, external ear and ear  canal normal.  Nose: Mucosal edema and rhinorrhea present.  Mouth/Throat: Posterior oropharyngeal erythema present. No oropharyngeal exudate or posterior oropharyngeal edema.  Eyes: No scleral icterus.  Neck: Neck supple.  Cardiovascular: Regular rhythm and normal heart sounds.   Pulmonary/Chest: Effort normal and breath sounds normal. No respiratory distress.  Neurological: She is alert and oriented to person, place, and time.  Skin: Skin is warm and dry.  Psychiatric: She has a normal mood and affect. Her speech is normal.    ED Course  Procedures (including critical care time) Labs Review Labs Reviewed - No data to display Imaging Review No results found.  EKG Interpretation    Date/Time:    Ventricular Rate:    PR Interval:    QRS Duration:   QT Interval:    QTC Calculation:   R Axis:     Text Interpretation:              MDM   1. Acute sinusitis    1)  Take the prescribed antibiotic as instructed.  IM Solumedrol given now.  If not improving, pt to call back and call in 6 day prednisone pack. 2)  Use nasal saline solution (over the counter) at least 3 times a day. 3)  Use over the counter decongestants like Zyrtec-D every 12 hours as needed to help with congestion.  If you have hypertension, do not take medicines with sudafed.  4)  Can take tylenol every 6 hours or motrin every 8 hours for pain or fever. 5)  Follow up with your primary doctor if no improvement in 5-7 days, sooner if increasing pain, fever, or new symptoms.     Janeann Forehand, MD 12/24/13 1308

## 2013-12-26 ENCOUNTER — Telehealth: Payer: Self-pay | Admitting: *Deleted

## 2014-01-02 ENCOUNTER — Telehealth: Payer: Self-pay | Admitting: *Deleted

## 2014-01-02 MED ORDER — DOXYCYCLINE HYCLATE 100 MG PO TABS: ORAL_TABLET | ORAL | Status: AC

## 2014-01-02 NOTE — Telephone Encounter (Signed)
Abx doxycycline sent to her [facility] used previously in w-s.  F/U with me if no improvement by end of week.

## 2014-01-02 NOTE — Telephone Encounter (Signed)
Pt.notified

## 2014-01-02 NOTE — Telephone Encounter (Signed)
Pt was seen in UC with a sinus infection. She still has about day left on her abx before she completes the course.She completed a course of prednisone and has been doing saline drops. Pt states although she feels somewhat  better she still has jaw pain, ear pain and still feels very fatigued. Pt denies fever or any new sxs since she was seen in UC. Please advise

## 2014-01-03 ENCOUNTER — Telehealth: Payer: Self-pay | Admitting: *Deleted

## 2014-01-03 NOTE — Telephone Encounter (Signed)
Pt left a message on my vm that she has been off of abilify for 3 weeks and she has been having multiple panic attacks. Pt states in her message that she has been sleeping 14 hours a day. She states she doesn't know what to do. I called the patient back and after talking with her she says she restarted the medication back on tues.I asked pt why she stopped the abilify and she says she gained a lot of weight after starting on that medication. I advised pt that it would probably be best if she scheduled an appt to discuss her concerns with Dr. Ileene Rubens. Pt was transferred to schedule an appt

## 2014-01-04 ENCOUNTER — Encounter: Payer: Self-pay | Admitting: Family Medicine

## 2014-01-04 ENCOUNTER — Ambulatory Visit (INDEPENDENT_AMBULATORY_CARE_PROVIDER_SITE_OTHER): Payer: 59 | Admitting: Family Medicine

## 2014-01-04 VITALS — BP 148/90 | HR 68 | Wt 167.0 lb

## 2014-01-04 DIAGNOSIS — F329 Major depressive disorder, single episode, unspecified: Secondary | ICD-10-CM

## 2014-01-04 DIAGNOSIS — F32A Depression, unspecified: Secondary | ICD-10-CM

## 2014-01-04 DIAGNOSIS — F3289 Other specified depressive episodes: Secondary | ICD-10-CM

## 2014-01-04 MED ORDER — ARIPIPRAZOLE 2 MG PO TABS: 2.0000 mg | ORAL_TABLET | Freq: Every day | ORAL | Status: DC

## 2014-01-04 NOTE — Progress Notes (Signed)
CC: Mary Stark is a 58 y.o. female is here for f/u depression   Subjective: HPI:  Complains of worsening subjective depression that began little over a week ago. In late December she was tapering herself off of Abilify because of weight gain and difficulty sleeping. About a week ago she noticed that she was sleeping 12-16 hours, not wearing to leave the bed, lack of motivation to engage in all pleasurable activities.  She also reports that along with these moderate to severe symptoms she was experiencing extreme anxiety that began earlier this week. she denies thoughts of going to harm herself or others. Nothing seems to make the symptoms better or worse. She continues on Wellbutrin. She restarted Abilify 5 mg on Tuesday of this week. Denies paranoia, hallucinations.   Review Of Systems Outlined In HPI  Past Medical History  Diagnosis Date  . Depression 07/17/2013  . History of hepatitis C 07/17/2013  . Hyperlipidemia 07/17/2013  . Vitamin D deficiency 07/17/2013     Family History  Problem Relation Age of Onset  . Ovarian cancer Mother   . Bronchitis Father   . Cardiomyopathy Father      History  Substance Use Topics  . Smoking status: Current Every Day Smoker    Last Attempt to Quit: 12/17/2012  . Smokeless tobacco: Not on file  . Alcohol Use: 3.0 oz/week    6 drink(s) per week     Objective: Filed Vitals:   01/04/14 1019  BP: 148/90  Pulse: 68    Vital signs reviewed. General: Alert and Oriented, No Acute Distress HEENT: Pupils equal, round, reactive to light. Conjunctivae clear.  External ears unremarkable.  Moist mucous membranes. Lungs: Clear and comfortable work of breathing, speaking in full sentences without accessory muscle use. Cardiac: Regular rate and rhythm.  Neuro: CN II-XII grossly intact, gait normal. Extremities: No peripheral edema.  Strong peripheral pulses.  Mental Status: Mild  Depression. No anxiety, nor agitation. Logical though process. Flat  affect Skin: Warm and dry.  Assessment & Plan: Mary Stark was seen today for f/u depression.  Diagnoses and associated orders for this visit:  Depression - ARIPiprazole (ABILIFY) 2 MG tablet; Take 1 tablet (2 mg total) by mouth daily.    Depression: Suspect is due to stopping Abilify and encouraged her to restart Abilify at a lower dose now 2 mg in hopes of minimizing the side effect of weight gain. Contracted for safety   I've asked her to update [place] on her sense of well-being on Monday with a phone call also start taking blood pressures at home next week.   Return if symptoms worsen or fail to improve.

## 2014-01-10 ENCOUNTER — Encounter: Payer: Self-pay | Admitting: Family Medicine

## 2014-01-17 ENCOUNTER — Ambulatory Visit (INDEPENDENT_AMBULATORY_CARE_PROVIDER_SITE_OTHER): Payer: 59 | Admitting: Family Medicine

## 2014-01-17 ENCOUNTER — Encounter: Payer: Self-pay | Admitting: Family Medicine

## 2014-01-17 VITALS — BP 140/90 | HR 92 | Wt 168.0 lb

## 2014-01-17 DIAGNOSIS — I10 Essential (primary) hypertension: Secondary | ICD-10-CM

## 2014-01-17 DIAGNOSIS — M5416 Radiculopathy, lumbar region: Secondary | ICD-10-CM

## 2014-01-17 DIAGNOSIS — IMO0002 Reserved for concepts with insufficient information to code with codable children: Secondary | ICD-10-CM

## 2014-01-17 MED ORDER — METOPROLOL TARTRATE 25 MG PO TABS: 25.0000 mg | ORAL_TABLET | Freq: Two times a day (BID) | ORAL | Status: DC

## 2014-01-17 MED ORDER — PREDNISONE 20 MG PO TABS: ORAL_TABLET | ORAL | Status: DC

## 2014-01-17 MED ORDER — HYDROCODONE-ACETAMINOPHEN 5-325 MG PO TABS: 1.0000 | ORAL_TABLET | Freq: Four times a day (QID) | ORAL | Status: DC | PRN

## 2014-01-17 NOTE — Progress Notes (Signed)
CC: Mary Stark is a 58 y.o. female is here for Back Pain   Subjective: HPI:  Complaints of low back pain localized to the midline just above the sacrum that radiates down through the right buttock down the back of the right thigh ending just above the knee. It is worse with sitting for long periods of time. She's tried ibuprofen 3 times a day however this is not providing much relief. Symptoms seem to be worse at the end of the day after activity. She's had similar character of pain for the past 2 history years this seems to come and go on a seasonal basis.  It return 2 weeks ago without any over exertion or traumatic event. She denies any motor or sensory disturbances and extremities.  Denies saddle paresthesia, bowel or bladder incontinence, nor any skin changes in the affected site of pain.  She has had stage I hypertension readings at multiple visits with [place] in the past and also with readings at home. There's been no chest pain, shortness of breath, orthopnea peripheral edema nor PND. She does try to stay active however family obligations and job responsibilities have caused problem with this.   Review Of Systems Outlined In HPI  Past Medical History  Diagnosis Date  . Depression 07/17/2013  . History of hepatitis C 07/17/2013  . Hyperlipidemia 07/17/2013  . Vitamin D deficiency 07/17/2013    Past Surgical History  Procedure Laterality Date  . Cesarean section  1984  . Tonsillectomy     Family History  Problem Relation Age of Onset  . Ovarian cancer Mother   . Bronchitis Father   . Cardiomyopathy Father     History   Social History  . Marital Status: Single    Spouse Name: N/A    Number of Children: N/A  . Years of Education: N/A   Occupational History  . Not on file.   Social History Main Topics  . Smoking status: Current Every Day Smoker    Last Attempt to Quit: 12/17/2012  . Smokeless tobacco: Not on file  . Alcohol Use: 3.0 oz/week    6 drink(s) per week  . Drug  Use: No  . Sexual Activity: Yes   Other Topics Concern  . Not on file   Social History Narrative  . No narrative on file     Objective: BP 140/90  Pulse 92  Wt 168 lb (76.204 kg)  General: Alert and Oriented, No Acute Distress HEENT: Pupils equal, round, reactive to light. Conjunctivae clear.   moist mucous membranes pharynx unremarkable  Lungs:  clear comfortable work of breathing  Cardiac: Regular rate and rhythm.  Extremities: No peripheral edema.  Strong peripheral pulses. Full range of st motion and strength in both lower extremities. L4 and S1 DTRs two over four bilaterally. She has full range of motion of the lumbar spine. Pain is not reproduced with palpation of the lower back, no midline back pain.  Mental Status: No depression, anxiety, nor agitation. Skin: Warm and dry.  Assessment & Plan: Tyffany was seen today for back pain.  Diagnoses and associated orders for this visit:  Essential hypertension, benign - metoprolol tartrate (LOPRESSOR) 25 MG tablet; Take 1 tablet (25 mg total) by mouth 2 (two) times daily.  Lumbar radicular pain - predniSONE (DELTASONE) 20 MG tablet; Three tabs daily days 1-3, two tabs daily days 4-6, one tab daily days 7-9, half tab daily days 10-13. - HYDROcodone-acetaminophen (NORCO/VICODIN) 5-325 MG per tablet; Take 1 tablet  by mouth every 6 (six) hours as needed for moderate pain.    Essential hypertension: Start metoprolol keep a blood pressure diary Lumbar radiculopathy: Feel that her exam correlates with facet disease and possible nerve impingement in L4 and L5. Start prednisone taper and as needed hydrocodone to help with sleep, encouraged her to see Dr. Darene Lamer. in sports medicine for evaluation of whether or not she would benefit from steroid injections in the spine   Return in about 2 weeks (around 01/31/2014) for Sports medicine referral with Dr. Darene Lamer.

## 2014-01-23 ENCOUNTER — Institutional Professional Consult (permissible substitution): Payer: 59 | Admitting: Sports Medicine

## 2014-01-27 ENCOUNTER — Encounter: Payer: Self-pay | Admitting: Emergency Medicine

## 2014-01-27 ENCOUNTER — Emergency Department
Admission: EM | Admit: 2014-01-27 | Discharge: 2014-01-27 | Disposition: A | Discharge status: Home / Self Care | Payer: 59 | Source: Home / Self Care | Attending: Family Medicine | Admitting: Family Medicine

## 2014-01-27 DIAGNOSIS — J069 Acute upper respiratory infection, unspecified: Secondary | ICD-10-CM

## 2014-01-27 MED ORDER — BENZONATATE 200 MG PO CAPS: 200.0000 mg | ORAL_CAPSULE | Freq: Every day | ORAL | Status: DC

## 2014-01-27 MED ORDER — DOXYCYCLINE HYCLATE 100 MG PO CAPS: 100.0000 mg | ORAL_CAPSULE | Freq: Two times a day (BID) | ORAL | Status: DC

## 2014-01-27 MED ORDER — PREDNISONE 20 MG PO TABS: 20.0000 mg | ORAL_TABLET | Freq: Two times a day (BID) | ORAL | Status: DC

## 2014-01-27 NOTE — ED Notes (Signed)
Mary Stark complains of body aches, headaches, sore throat, runny nose, sneezing and congestion for 2 days. Denies fever, chills or sweats. She had similar symptoms a few weeks ago. She was treated with Biaxin.

## 2014-01-27 NOTE — ED Provider Notes (Signed)
CSN: [id number]     Arrival date & time 01/27/14  1238 History   First MD Initiated Contact with Patient 01/27/14 1334     Chief Complaint  Patient presents with  . Nasal Congestion    x 2 days  . Headache    x 2 days  . Sore Throat    x 2 days        HPI Comments: Patient complains of two day history of sneezing, runny nose, mild sore throat, fatigue, and headache.  The history is provided by the patient.    Past Medical History  Diagnosis Date  . Depression 07/17/2013  . History of hepatitis C 07/17/2013  . Hyperlipidemia 07/17/2013  . Vitamin D deficiency 07/17/2013   Past Surgical History  Procedure Laterality Date  . Cesarean section  1984  . Tonsillectomy     Family History  Problem Relation Age of Onset  . Ovarian cancer Mother   . Bronchitis Father   . Cardiomyopathy Father    History  Substance Use Topics  . Smoking status: Current Every Day Smoker    Last Attempt to Quit: 12/17/2012  . Smokeless tobacco: Never Used  . Alcohol Use: 3.0 oz/week    6 drink(s) per week   OB History   Grav Para Term Preterm Abortions TAB SAB Ect Mult Living                 Review of Systems + sore throat No cough No pleuritic pain, but feels tightness in anterior chest No wheezing + nasal congestion + post-nasal drainage No sinus pain/pressure No itchy/red eyes No earache No hemoptysis No SOB No fever/chills + nausea No vomiting No abdominal pain No diarrhea No urinary symptoms No skin rash + fatigue No myalgias + headache Used OTC meds without relief    Allergies  Penicillins  Home Medications   Current Outpatient Rx  Name  Route  Sig  Dispense  Refill  . ARIPiprazole (ABILIFY) 2 MG tablet   Oral   Take 1 tablet (2 mg total) by mouth daily.   30 tablet   2   . buPROPion (WELLBUTRIN XL) 150 MG 24 hr tablet      TAKE 3 TABLETS BY MOUTH ONCE DAILY         . Calcium Carbonate-Vitamin D (CALCIUM 600+D) 600-400 MG-UNIT per tablet   Oral   Take 1  tablet by mouth 2 (two) times daily.         . clonazePAM (KLONOPIN) 0.5 MG tablet      Half to full tablet by mouth as needed for sleep.   30 tablet   1   . Coenzyme Q10 (CO Q 10 PO)   Oral   Take by mouth.         . diclofenac (FLECTOR) 1.3 % PTCH      Apply to back once daily; take off after 12 hours   30 patch   0   . diclofenac sodium (VOLTAREN) 1 % GEL      Apply film to location of back pain three times a day.   100 g   1   . diphenhydrAMINE (BENADRYL) 25 MG tablet   Oral   Take 25 mg by mouth every 6 (six) hours as needed for itching.         . docusate sodium (COLACE) 250 MG capsule   Oral   Take 250 mg by mouth daily.         Marland Kitchen  GLUCOSAMINE HCL PO   Oral   Take by mouth.         Marland Kitchen HYDROcodone-acetaminophen (NORCO/VICODIN) 5-325 MG per tablet   Oral   Take 1 tablet by mouth every 6 (six) hours as needed for moderate pain.   30 tablet   0   . metoprolol tartrate (LOPRESSOR) 25 MG tablet   Oral   Take 1 tablet (25 mg total) by mouth 2 (two) times daily.   60 tablet   1   . Multiple Vitamins-Minerals (MULTIVITAMIN PO)   Oral   Take by mouth.         . Omega-3 Fatty Acids (FISH OIL) 1200 MG CAPS   Oral   Take by mouth.         . simvastatin (ZOCOR) 20 MG tablet      TAKE 1 TABLET BY MOUTH DAILY         . traMADol (ULTRAM) 50 MG tablet   Oral   Take 1 tablet (50 mg total) by mouth every 6 (six) hours as needed.   20 tablet   0   . benzonatate (TESSALON) 200 MG capsule   Oral   Take 1 capsule (200 mg total) by mouth at bedtime. Take as needed for cough   12 capsule   0   . doxycycline (VIBRAMYCIN) 100 MG capsule   Oral   Take 1 capsule (100 mg total) by mouth 2 (two) times daily. (Rx void after 02/04/14)   20 capsule   0   . predniSONE (DELTASONE) 20 MG tablet   Oral   Take 1 tablet (20 mg total) by mouth 2 (two) times daily. Take with food.   10 tablet   0    BP 155/96  Pulse 76  Temp(Src) 97.8 F (36.6 C)  (Oral)  Ht 5\' 6"  (1.676 m)  Wt 165 lb (74.844 kg)  BMI 26.64 kg/m2  SpO2 97% Physical Exam Nursing notes and Vital Signs reviewed. Appearance:  Patient appears healthy, stated age, and in no acute distress Eyes:  Pupils are equal, round, and reactive to light and accomodation.  Extraocular movement is intact.  Conjunctivae are not inflamed  Ears:  Canals normal.  Tympanic membranes normal.  Nose:  Mildly congested turbinates.  No sinus tenderness.   Pharynx:  Normal Neck:  Supple.   Nontender enlarged posterior nodes are palpated bilaterally  Lungs:  Clear to auscultation.  Breath sounds are equal.  Heart:  Regular rate and rhythm without murmurs, rubs, or gallops.  Abdomen:  Nontender without masses or hepatosplenomegaly.  Bowel sounds are present.  No CVA or flank tenderness.  Extremities:  No edema.  No calf tenderness Skin:  No rash present.   ED Course  Procedures  none       MDM   Final diagnoses:  Acute upper respiratory infections of unspecified site; suspect early viral URI    There is no evidence of bacterial infection today.  Treat symptomatically for now  Prescription written for Benzonatate (Tessalon) to take at bedtime for night-time cough.  Prednisone burst. Take plain Mucinex (1200 mg guaifenesin) twice daily for cough and congestion.  Increase fluid intake, rest. May use Afrin nasal spray (or generic oxymetazoline) twice daily for about 5 days.  Also recommend using saline nasal spray several times daily and saline nasal irrigation (AYR is a common brand) Try warm salt water gargles for sore throat.  Stop all antihistamines for now, and other non-prescription cough/cold preparations. Begin doxycycline if  not improving about one week or if persistent fever develops (Given a prescription to hold, with an expiration date)  Follow-up with family doctor if not improving about10 days.     Mary Nicolas, MD 01/28/14 1004

## 2014-01-27 NOTE — Discharge Instructions (Signed)
Take plain Mucinex (1200 mg guaifenesin) twice daily for cough and congestion.  Increase fluid intake, rest. May use Afrin nasal spray (or generic oxymetazoline) twice daily for about 5 days.  Also recommend using saline nasal spray several times daily and saline nasal irrigation (AYR is a common brand) Try warm salt water gargles for sore throat.  Stop all antihistamines for now, and other non-prescription cough/cold preparations. Begin doxycycline if not improving about one week or if persistent fever develops (Given a prescription to hold, with an expiration date)  Follow-up with family doctor if not improving about10 days.

## 2014-01-29 ENCOUNTER — Telehealth: Payer: Self-pay | Admitting: *Deleted

## 2014-01-29 NOTE — Telephone Encounter (Signed)
Pt called and left a message stating she is sick with a sinus infection again and she is now having major vision changes. She has an eye doctor visit scheduled for today. She wants to know if she should f/u with dr. Ileene Rubens because of the vision changes and she has had so many sinus infections in the past couple of months. I left her a message to update [place] on what the eye says and based on the findings we can determine if a f/u is needed

## 2014-02-02 ENCOUNTER — Telehealth: Payer: Self-pay | Admitting: *Deleted

## 2014-02-02 DIAGNOSIS — G4700 Insomnia, unspecified: Secondary | ICD-10-CM

## 2014-02-02 MED ORDER — CLONAZEPAM 0.5 MG PO TABS: ORAL_TABLET | ORAL | Status: DC

## 2014-02-02 NOTE — Telephone Encounter (Signed)
Pt requesting clonopin sent to [facility]

## 2014-03-08 ENCOUNTER — Other Ambulatory Visit: Payer: Self-pay | Admitting: *Deleted

## 2014-03-08 ENCOUNTER — Telehealth: Payer: Self-pay | Admitting: *Deleted

## 2014-03-08 DIAGNOSIS — G4700 Insomnia, unspecified: Secondary | ICD-10-CM

## 2014-03-08 MED ORDER — SIMVASTATIN 20 MG PO TABS: 20.0000 mg | ORAL_TABLET | Freq: Every day | ORAL | Status: DC

## 2014-03-08 MED ORDER — CLONAZEPAM 0.5 MG PO TABS: ORAL_TABLET | ORAL | Status: DC

## 2014-03-08 MED ORDER — TRAMADOL HCL 50 MG PO TABS: 50.0000 mg | ORAL_TABLET | Freq: Four times a day (QID) | ORAL | Status: DC | PRN

## 2014-03-08 NOTE — Telephone Encounter (Signed)
Pt request refill of simvastatin. Last labs in sep were good and no need to repeat until a year

## 2014-03-08 NOTE — Progress Notes (Signed)
Pt would like to get a rx for ultram to use instead of hydrocodone. She doesn't want to be on hydrocodone anymore. Since we have filled this before will send in a refill of tramadol

## 2014-03-12 ENCOUNTER — Other Ambulatory Visit: Payer: Self-pay | Admitting: Family Medicine

## 2014-04-10 ENCOUNTER — Ambulatory Visit (INDEPENDENT_AMBULATORY_CARE_PROVIDER_SITE_OTHER): Payer: 59 | Admitting: Obstetrics & Gynecology

## 2014-04-10 ENCOUNTER — Encounter: Payer: Self-pay | Admitting: Obstetrics & Gynecology

## 2014-04-10 VITALS — BP 161/102 | HR 82 | Temp 96.8°F | Resp 16 | Ht 66.0 in | Wt 159.0 lb

## 2014-04-10 DIAGNOSIS — N898 Other specified noninflammatory disorders of vagina: Secondary | ICD-10-CM

## 2014-04-10 DIAGNOSIS — N942 Vaginismus: Secondary | ICD-10-CM

## 2014-04-11 ENCOUNTER — Telehealth: Payer: Self-pay | Admitting: *Deleted

## 2014-04-11 DIAGNOSIS — B9689 Other specified bacterial agents as the cause of diseases classified elsewhere: Secondary | ICD-10-CM

## 2014-04-11 DIAGNOSIS — N760 Acute vaginitis: Principal | ICD-10-CM

## 2014-04-11 LAB — WET PREP BY MOLECULAR PROBE
CANDIDA SPECIES: NEGATIVE
Gardnerella vaginalis: POSITIVE — AB
Trichomonas vaginosis: NEGATIVE

## 2014-04-11 MED ORDER — METRONIDAZOLE 500 MG PO TABS: 500.0000 mg | ORAL_TABLET | Freq: Two times a day (BID) | ORAL | Status: DC

## 2014-04-11 NOTE — Telephone Encounter (Signed)
LM on voicemail that affirm was positive for Bv and RX sent to [facility] for Flagyl 500 mg BID x 7 days per Dr Gala Romney.

## 2014-04-12 ENCOUNTER — Encounter: Payer: Self-pay | Admitting: Emergency Medicine

## 2014-04-12 ENCOUNTER — Emergency Department
Admission: EM | Admit: 2014-04-12 | Discharge: 2014-04-12 | Disposition: A | Discharge status: Home / Self Care | Payer: 59 | Source: Home / Self Care | Attending: Emergency Medicine | Admitting: Emergency Medicine

## 2014-04-12 DIAGNOSIS — N942 Vaginismus: Secondary | ICD-10-CM | POA: Insufficient documentation

## 2014-04-12 DIAGNOSIS — J069 Acute upper respiratory infection, unspecified: Secondary | ICD-10-CM

## 2014-04-12 MED ORDER — PREDNISONE (PAK) 10 MG PO TABS: ORAL_TABLET | Freq: Every day | ORAL | Status: DC

## 2014-04-12 MED ORDER — SULFAMETHOXAZOLE-TRIMETHOPRIM 800-160 MG PO TABS: 1.0000 | ORAL_TABLET | Freq: Two times a day (BID) | ORAL | Status: DC

## 2014-04-12 NOTE — ED Notes (Signed)
Drainage down throat cause cough can't sleep, congestion, hoarseness x 5 days

## 2014-04-12 NOTE — Progress Notes (Addendum)
   Subjective:    Patient ID: Mary Stark, female    DOB: 23-Jul-1956, 58 y.o.   MRN: [id number]  HPI Pt complaining of vaginal discharge and spotting a light pink tinge when wiping x1.  Pt also has painful intercourse.   Review of Systems  Constitutional: Negative.   Gastrointestinal: Negative.   Genitourinary: Positive for vaginal discharge.       Objective:   Physical Exam  Constitutional: She is oriented to person, place, and time. She appears well-developed and well-nourished.  HENT:  Head: Normocephalic and atraumatic.  Abdominal: Soft. She exhibits no distension and no mass. There is no tenderness. There is no rebound and no guarding.  Genitourinary:     Vagina very atrophic and speculum exam caused an abrasion. introitis is tight from pelvic floor dysfunction  Neurological: She is alert and oriented to person, place, and time.  Skin: Skin is warm and dry.  Psychiatric: She has a normal mood and affect.          Assessment & Plan:  58 yo female with vaginal discharge , atrophic vaginitis affecting urethral meatus, and vaginismus.  1-BD affirm 2-Vagi fem encouraged (pt was prescribed this at prior visit) 3-Referal to PT for pelvic floor dysfunction. 4-F/u with PCP for BP control.

## 2014-04-12 NOTE — ED Provider Notes (Addendum)
CSN: [id number]     Arrival date & time 04/12/14  1212 History   None    Chief Complaint  Patient presents with  . Sinus Problem   (Consider location/radiation/quality/duration/timing/severity/associated sxs/prior Treatment) HPI Mary Stark is a 58 y.o. female who complains of onset of cold symptoms for 5 days.  The symptoms are constant and mild-moderate in severity.  This spring she is having a lot of upper respiratory symptoms but does not have any history of allergic rhinitis.  Nothing has changed in terms of new medicines, no concerns, no new travel, new house, etc.   + sore throat +/- cough at night so can't sleep well + hoarseness No pleuritic pain No wheezing + nasal congestion + post-nasal drainage + sinus pain/pressure No itchy/red eyes No earache No hemoptysis No SOB No chills/sweats No fever No nausea No vomiting No abdominal pain No diarrhea No skin rashes + fatigue + myalgias No headache     Past Medical History  Diagnosis Date  . Depression 07/17/2013  . History of hepatitis C 07/17/2013  . Hyperlipidemia 07/17/2013  . Vitamin D deficiency 07/17/2013   Past Surgical History  Procedure Laterality Date  . Cesarean section  1984  . Tonsillectomy     Family History  Problem Relation Age of Onset  . Ovarian cancer Mother   . Bronchitis Father   . Cardiomyopathy Father    History  Substance Use Topics  . Smoking status: Former Smoker    Quit date: 12/17/2012  . Smokeless tobacco: Never Used  . Alcohol Use: 3.0 oz/week    6 drink(s) per week   OB History   Grav Para Term Preterm Abortions TAB SAB Ect Mult Living                 Review of Systems  All other systems reviewed and are negative.   Allergies  Penicillins  Home Medications   Prior to Admission medications   Medication Sig Start Date End Date Taking? Authorizing Provider  ARIPiprazole (ABILIFY) 2 MG tablet Take 1 tablet (2 mg total) by mouth daily. 01/04/14   Sean Hommel, DO  buPROPion  (WELLBUTRIN XL) 150 MG 24 hr tablet TAKE 3 TABLETS BY MOUTH ONCE DAILY 02/15/13   Historical Provider, MD  Calcium Carbonate-Vitamin D (CALCIUM 600+D) 600-400 MG-UNIT per tablet Take 1 tablet by mouth 2 (two) times daily.    Historical Provider, MD  clonazePAM (KLONOPIN) 0.5 MG tablet Half to full tablet by mouth as needed for sleep. 03/08/14   Sean Hommel, DO  Coenzyme Q10 (CO Q 10 PO) Take by mouth.    Historical Provider, MD  diclofenac (FLECTOR) 1.3 % PTCH Apply to back once daily; take off after 12 hours 10/21/13   Kandra Nicolas, MD  diclofenac sodium (VOLTAREN) 1 % GEL Apply film to location of back pain three times a day. 10/24/13   Marcial Pacas, DO  diphenhydrAMINE (BENADRYL) 25 MG tablet Take 25 mg by mouth every 6 (six) hours as needed for itching.    Historical Provider, MD  docusate sodium (COLACE) 250 MG capsule Take 250 mg by mouth daily.    Historical Provider, MD  doxycycline (VIBRAMYCIN) 100 MG capsule Take 1 capsule (100 mg total) by mouth 2 (two) times daily. (Rx void after 02/04/14) 01/27/14   Kandra Nicolas, MD  GLUCOSAMINE HCL PO Take by mouth.    Historical Provider, MD  HYDROcodone-acetaminophen (NORCO/VICODIN) 5-325 MG per tablet Take 1 tablet by mouth every 6 (six) hours as  needed for moderate pain. 01/17/14   Marcial Pacas, DO  metoprolol tartrate (LOPRESSOR) 25 MG tablet Take 1 tablet (25 mg total) by mouth 2 (two) times daily. 01/17/14   Marcial Pacas, DO  metroNIDAZOLE (FLAGYL) 500 MG tablet Take 1 tablet (500 mg total) by mouth 2 (two) times daily. 04/11/14   Guss Bunde, MD  Multiple Vitamins-Minerals (MULTIVITAMIN PO) Take by mouth.    Historical Provider, MD  Omega-3 Fatty Acids (FISH OIL) 1200 MG CAPS Take by mouth.    Historical Provider, MD  simvastatin (ZOCOR) 20 MG tablet Take 1 tablet (20 mg total) by mouth at bedtime. TAKE 1 TABLET BY MOUTH DAILY 03/08/14   Marcial Pacas, DO  traMADol (ULTRAM) 50 MG tablet Take 1 tablet (50 mg total) by mouth every 6 (six) hours as  needed. 03/08/14   Sean Hommel, DO   BP 172/113  Pulse 101  Temp(Src) 98.1 F (36.7 C) (Oral)  Ht 5\' 6"  (1.676 m)  Wt 159 lb (72.122 kg)  BMI 25.68 kg/m2  SpO2 97% Physical Exam  Nursing note and vitals reviewed. Constitutional: She is oriented to person, place, and time. She appears well-developed and well-nourished.  HENT:  Head: Normocephalic and atraumatic.  Right Ear: Tympanic membrane, external ear and ear canal normal.  Left Ear: Tympanic membrane, external ear and ear canal normal.  Nose: Mucosal edema and rhinorrhea present.  Mouth/Throat: Posterior oropharyngeal erythema present. No oropharyngeal exudate or posterior oropharyngeal edema.  Eyes: No scleral icterus.  Neck: Neck supple.  Cardiovascular: Regular rhythm and normal heart sounds.   Pulmonary/Chest: Effort normal and breath sounds normal. No respiratory distress.  Neurological: She is alert and oriented to person, place, and time.  Skin: Skin is warm and dry.  Psychiatric: She has a normal mood and affect. Her speech is normal.    ED Course  Procedures (including critical care time) Labs Review Labs Reviewed - No data to display  Imaging Review No results found.   MDM   1. Acute upper respiratory infections of unspecified site    1)  Take the prescribed antibiotic as instructed along with the prednisone.  Supposedly she also has been diagnosed with BV and it sounds like her gynecologist may be prescribing her some metronidazole or MetroGel.  I advised her not to drink alcohol but that these antibiotics can be taken together safely however sometimes with some slight abdominal discomfort. 2)  Use nasal saline solution (over the counter) at least 3 times a day. 3)  Use over the counter decongestants like Zyrtec-D every 12 hours as needed to help with congestion.  If you have hypertension, do not take medicines with sudafed.  4)  Can take tylenol every 6 hours or motrin every 8 hours for pain or fever. 5)   Follow up with your primary doctor if no improvement in 5-7 days, sooner if increasing pain, fever, or new symptoms.   Ex-smoker, so if cough continues, PCP can consider CXR at that point.  Janeann Forehand, MD 04/12/14 1251  Patient also counseled on following up with her PCP re: her HTN and making sure that she is taking her Metoprolol as directed.  As of today, no signs consistent with HTN-urgency or emergency.  Janeann Forehand, MD 04/12/14 1325

## 2014-06-04 ENCOUNTER — Ambulatory Visit (INDEPENDENT_AMBULATORY_CARE_PROVIDER_SITE_OTHER): Payer: 59 | Admitting: Psychiatry

## 2014-06-04 ENCOUNTER — Encounter (HOSPITAL_COMMUNITY): Payer: Self-pay | Admitting: Psychiatry

## 2014-06-04 VITALS — Ht 66.0 in | Wt 156.0 lb

## 2014-06-04 DIAGNOSIS — F419 Anxiety disorder, unspecified: Secondary | ICD-10-CM

## 2014-06-04 DIAGNOSIS — F411 Generalized anxiety disorder: Secondary | ICD-10-CM

## 2014-06-04 DIAGNOSIS — F331 Major depressive disorder, recurrent, moderate: Secondary | ICD-10-CM

## 2014-06-04 MED ORDER — BUSPIRONE HCL 10 MG PO TABS: ORAL_TABLET | ORAL | Status: DC
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
## 2014-07-16 ENCOUNTER — Ambulatory Visit: Payer: Self-pay | Admitting: Family Medicine

## 2014-07-19 ENCOUNTER — Ambulatory Visit (HOSPITAL_COMMUNITY): Payer: Self-pay | Admitting: Psychiatry

## 2014-09-17 ENCOUNTER — Other Ambulatory Visit: Payer: Self-pay | Admitting: Obstetrics & Gynecology

## 2014-10-10 ENCOUNTER — Encounter: Payer: Self-pay | Admitting: Podiatry

## 2014-10-10 ENCOUNTER — Ambulatory Visit (INDEPENDENT_AMBULATORY_CARE_PROVIDER_SITE_OTHER): Payer: 59 | Admitting: Podiatry

## 2014-10-10 VITALS — BP 148/93 | HR 80 | Resp 16

## 2014-10-10 DIAGNOSIS — L600 Ingrowing nail: Secondary | ICD-10-CM

## 2014-10-10 DIAGNOSIS — B351 Tinea unguium: Secondary | ICD-10-CM

## 2014-10-10 NOTE — Patient Instructions (Addendum)

## 2014-10-10 NOTE — Progress Notes (Signed)
   Subjective:    Patient ID: Mary Stark, female    DOB: [birth year], 58 y.o.   MRN: [id number]  HPI Comments: "I have something going on with this toe"  Patient c/o thick, dark area lateral corner nail 1st right for few months. She says it burns sometimes. PCP referred to dermatologist and they referred her here. No treatment.     Review of Systems  Constitutional: Positive for unexpected weight change.  HENT: Positive for sinus pressure.   Respiratory: Positive for cough.   Gastrointestinal: Positive for abdominal distention.  Musculoskeletal: Positive for back pain and myalgias.  Skin:       Change in nails  Psychiatric/Behavioral: The patient is nervous/anxious.   All other systems reviewed and are negative.      Objective:   Physical Exam        Assessment & Plan:

## 2014-10-11 NOTE — Progress Notes (Signed)
Subjective:     Patient ID: Mary Stark, female   DOB: [birth year], 58 y.o.   MRN: [id number]  HPI patient states I have has some discoloration on my right big toenail and also have had some burning on the corner of it when I try to wear shoe gear or walk. States it's been present for several months   Review of Systems  All other systems reviewed and are negative.      Objective:   Physical Exam  Nursing note and vitals reviewed. Constitutional: She is oriented to person, place, and time.  Cardiovascular: Intact distal pulses.   Musculoskeletal: Normal range of motion.  Neurological: She is oriented to person, place, and time.  Skin: Skin is warm.   neurovascular status is found to be intact with muscle strength adequate and range of motion subtalar and midtarsal joint within normal limits. 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 I recommended removal of the corner and explained procedure and risks and the fact were and allow the nail to grow out and hopefully the fungal element will resolve. Patient wants procedure understanding risks and today I infiltrated the right hallux 60 mg Xylocaine Marcaine mixture prepped the toe and then removed the lateral corner exposing the matrix and applied phenol 3 applications 30 seconds followed by alcohol lavage and sterile dressing. Gave instructions on soaks and reappoint

## 2015-03-28 LAB — HM COLONOSCOPY

## 2015-04-24 ENCOUNTER — Other Ambulatory Visit: Payer: Self-pay | Admitting: Orthopaedic Surgery

## 2015-04-24 DIAGNOSIS — M25511 Pain in right shoulder: Secondary | ICD-10-CM

## 2015-05-22 ENCOUNTER — Ambulatory Visit
Admission: RE | Admit: 2015-05-22 | Discharge: 2015-05-22 | Disposition: A | Discharge status: Home / Self Care | Payer: 59 | Source: Ambulatory Visit | Attending: Orthopaedic Surgery | Admitting: Orthopaedic Surgery

## 2015-05-22 DIAGNOSIS — M25511 Pain in right shoulder: Secondary | ICD-10-CM

## 2015-07-03 DIAGNOSIS — G8929 Other chronic pain: Secondary | ICD-10-CM | POA: Insufficient documentation

## 2015-07-03 DIAGNOSIS — M25511 Pain in right shoulder: Secondary | ICD-10-CM

## 2015-12-19 MED FILL — ARIPiprazole 10 MG TABS: 10 | 90 days supply | Qty: 45 | Fill #2

## 2015-12-19 MED FILL — BUPROPION HCL XL 150 MG TAB: 150 | 90 days supply | Qty: 270 | Fill #0

## 2015-12-30 ENCOUNTER — Encounter: Payer: Self-pay | Admitting: Obstetrics & Gynecology

## 2015-12-30 ENCOUNTER — Other Ambulatory Visit: Payer: Self-pay | Admitting: Obstetrics & Gynecology

## 2015-12-30 ENCOUNTER — Ambulatory Visit (INDEPENDENT_AMBULATORY_CARE_PROVIDER_SITE_OTHER): Payer: 59 | Admitting: Obstetrics & Gynecology

## 2015-12-30 VITALS — BP 146/82 | HR 72 | Resp 16 | Ht 66.0 in | Wt 181.0 lb

## 2015-12-30 DIAGNOSIS — J069 Acute upper respiratory infection, unspecified: Secondary | ICD-10-CM | POA: Diagnosis not present

## 2015-12-30 DIAGNOSIS — B9689 Other specified bacterial agents as the cause of diseases classified elsewhere: Secondary | ICD-10-CM

## 2015-12-30 DIAGNOSIS — N952 Postmenopausal atrophic vaginitis: Secondary | ICD-10-CM

## 2015-12-30 DIAGNOSIS — N760 Acute vaginitis: Secondary | ICD-10-CM

## 2015-12-30 DIAGNOSIS — Z1231 Encounter for screening mammogram for malignant neoplasm of breast: Secondary | ICD-10-CM

## 2015-12-30 DIAGNOSIS — A499 Bacterial infection, unspecified: Secondary | ICD-10-CM

## 2015-12-31 LAB — WET PREP BY MOLECULAR PROBE
Candida species: NEGATIVE
Gardnerella vaginalis: NEGATIVE
Trichomonas vaginosis: NEGATIVE

## 2016-01-01 ENCOUNTER — Telehealth: Payer: Self-pay | Admitting: *Deleted

## 2016-01-01 NOTE — Telephone Encounter (Signed)
Pt notified of normal affirm results.  Pt will pick up her Vagifem and start that.

## 2016-01-01 NOTE — Progress Notes (Signed)
   Subjective:    Patient ID: Mary Stark, female    DOB: [birth year], 60 y.o.   MRN: [id number]  HPI  60 yo female present with symptoms of URI and vaginal discharge  URI Location: throat, cough, sinus congestion Severity: moderate painand moderate frequency of cough Duration: 4-5 days Timing: continuous Modifying factors: pain in throat worse with coughing Associated signs and symptoms: denies ear pain, fever, rigors, chills  Vaginal discharge irritation Location: vagina lower  Quality: burning, discomfort Severity: moderate Duration: several days Modifying factors: not worse with urination Associated signs and symptoms: no bleeding or pelvic pain     Review of Systems  Constitutional: Positive for fatigue. Negative for fever and chills.  HENT: Positive for congestion, rhinorrhea, sinus pressure and sore throat. Negative for ear discharge, ear pain and mouth sores.   Eyes: Negative for discharge, redness and itching.  Respiratory: Positive for cough.   Cardiovascular: Negative.   Gastrointestinal: Negative.   Genitourinary: Positive for vaginal discharge and vaginal pain. Negative for vaginal bleeding, menstrual problem and pelvic pain.  Musculoskeletal: Negative.   Neurological: Negative.   Psychiatric/Behavioral: Negative.    Past Medical History  Diagnosis Date  . Depression 07/17/2013  . History of hepatitis C 07/17/2013  . Hyperlipidemia 07/17/2013  . Vitamin D deficiency 07/17/2013   Problem list reviewed and updated.     Objective:   Physical Exam  Constitutional: She is oriented to person, place, and time. She appears well-developed and well-nourished. No distress.  HENT:  Head: Normocephalic and atraumatic.  Right Ear: External ear normal.  Left Ear: External ear normal.  Mouth/Throat: Oropharyngeal exudate present.  TM full  Eyes: Conjunctivae are normal. Right eye exhibits no discharge. Left eye exhibits no discharge.  Neck: Neck supple. No thyromegaly  present.  Cardiovascular: Normal rate and regular rhythm.   Pulmonary/Chest: Effort normal and breath sounds normal. No respiratory distress. She has no wheezes. She has no rales.  Abdominal: Soft. She exhibits no distension.  Genitourinary:  Atrophic vagina, no lesion, no blood, minimal discharge  Neurological: She is alert and oriented to person, place, and time.  Skin: Skin is warm and dry.  Psychiatric: She has a normal mood and affect.  Vitals reviewed.  Filed Vitals:   12/30/15 1448  BP: 146/82  Pulse: 72  Resp: 16  Height: 5\' 6"  (1.676 m)  Weight: 181 lb (82.101 kg)           Assessment & Plan:  1.  URI--symptomatic relief; no evidence of strep or sinusitis.  Mucinex DM 2.  Vaginal discharge--BD affirm, will treat on results. 3.  Atrophic vaginitis--vagifem 4.  Watch BP and f/u with PCP.

## 2016-01-06 ENCOUNTER — Other Ambulatory Visit: Payer: Self-pay | Admitting: Obstetrics & Gynecology

## 2016-01-06 MED FILL — VAGIFEM 10 MCG VAGINAL TAB: 10 | 30 days supply | Qty: 18 | Fill #0

## 2016-01-08 ENCOUNTER — Ambulatory Visit (INDEPENDENT_AMBULATORY_CARE_PROVIDER_SITE_OTHER): Payer: 59

## 2016-01-08 DIAGNOSIS — Z1231 Encounter for screening mammogram for malignant neoplasm of breast: Secondary | ICD-10-CM

## 2016-01-31 MED FILL — SIMVASTATIN 20 MG TABLET: 20 | 90 days supply | Qty: 90 | Fill #1

## 2016-03-02 MED FILL — METOPROLOL SUCC ER 50 MG TA: 50 | 90 days supply | Qty: 90 | Fill #2

## 2016-03-12 DIAGNOSIS — I10 Essential (primary) hypertension: Secondary | ICD-10-CM | POA: Diagnosis not present

## 2016-03-18 MED FILL — BUPROPION HCL XL 150 MG TAB: 150 | 90 days supply | Qty: 270 | Fill #0

## 2016-03-18 MED FILL — ARIPiprazole 10 MG TABS: 10 | 90 days supply | Qty: 45 | Fill #3

## 2016-03-22 ENCOUNTER — Emergency Department (INDEPENDENT_AMBULATORY_CARE_PROVIDER_SITE_OTHER): Payer: 59

## 2016-03-22 ENCOUNTER — Emergency Department
Admission: EM | Admit: 2016-03-22 | Discharge: 2016-03-22 | Disposition: A | Discharge status: Home / Self Care | Payer: 59 | Source: Home / Self Care | Attending: Family Medicine | Admitting: Family Medicine

## 2016-03-22 DIAGNOSIS — M7989 Other specified soft tissue disorders: Secondary | ICD-10-CM | POA: Diagnosis not present

## 2016-03-22 DIAGNOSIS — M25531 Pain in right wrist: Secondary | ICD-10-CM

## 2016-03-22 DIAGNOSIS — M79631 Pain in right forearm: Secondary | ICD-10-CM

## 2016-03-22 DIAGNOSIS — M25431 Effusion, right wrist: Secondary | ICD-10-CM | POA: Diagnosis not present

## 2016-03-22 NOTE — ED Provider Notes (Signed)
CSN: [id number]     Arrival date & time 03/22/16  1626 History   First MD Initiated Contact with Patient 03/22/16 1641     Chief Complaint  Patient presents with  . Wrist Pain   (Consider location/radiation/quality/duration/timing/severity/associated sxs/prior Treatment) HPI  The pt is a 60yo female presenting to [facility] with c/o Right wrist and forearm pain and swelling that started last night.  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  Denies known recent injury.  She has been taking acetaminophen and ultram with moderate relief.  Pain is aching and sore, 3/10.  Denies fever or chills. No weakness or numbness in her hand or wrist.  Pt does report waterskiing injury to Right forearm when she was in 6th grade, resulting in a deformity but she notes she does not typically have problems with her forearm or wrist. She does not wear a wrist splint.   Past Medical History  Diagnosis Date  . Depression 07/17/2013  . History of hepatitis C 07/17/2013  . Hyperlipidemia 07/17/2013  . Vitamin D deficiency 07/17/2013   Past Surgical History  Procedure Laterality Date  . Cesarean section  1984  . Tonsillectomy     Family History  Problem Relation Age of Onset  . Ovarian cancer Mother   . Bronchitis Father   . Cardiomyopathy Father    Social History  Substance Use Topics  . Smoking status: Former Smoker    Quit date: 12/17/2012  . Smokeless tobacco: Never Used  . Alcohol Use: 3.0 oz/week    6 Standard drinks or equivalent per week   OB History    No data available     Review of Systems  Constitutional: Negative for fever and chills.  Musculoskeletal: Positive for myalgias, joint swelling and arthralgias.  Skin: Negative for color change and wound.  Neurological: Negative for weakness and numbness.    Allergies  Penicillins  Home Medications   Prior to Admission medications   Medication Sig Start Date End Date Taking? Authorizing  Provider  ARIPiprazole (ABILIFY) 2 MG tablet Take 1 tablet (2 mg total) by mouth daily. 01/04/14   Sean Hommel, DO  buPROPion (WELLBUTRIN XL) 150 MG 24 hr tablet TAKE 3 TABLETS BY MOUTH ONCE DAILY 02/15/13   Historical Provider, MD  Calcium Carbonate-Vitamin D (CALCIUM 600+D) 600-400 MG-UNIT per tablet Take 1 tablet by mouth 2 (two) times daily.    Historical Provider, MD  clonazePAM (KLONOPIN) 0.5 MG tablet Half to full tablet by mouth as needed for sleep. 03/08/14   Sean Hommel, DO  Coenzyme Q10 (CO Q 10 PO) Take by mouth.    Historical Provider, MD  diphenhydrAMINE (BENADRYL) 25 MG tablet Take 25 mg by mouth every 6 (six) hours as needed for itching.    Historical Provider, MD  docusate sodium (COLACE) 250 MG capsule Take 250 mg by mouth daily.    Historical Provider, MD  GLUCOSAMINE HCL PO Take by mouth.    Historical Provider, MD  metoprolol tartrate (LOPRESSOR) 25 MG tablet Take 1 tablet (25 mg total) by mouth 2 (two) times daily. 01/17/14   Marcial Pacas, DO  Omega-3 Fatty Acids (FISH OIL) 1200 MG CAPS Take by mouth.    Historical Provider, MD  simvastatin (ZOCOR) 20 MG tablet Take 1 tablet (20 mg total) by mouth at bedtime. TAKE 1 TABLET BY MOUTH DAILY 03/08/14   Marcial Pacas, DO  traMADol (ULTRAM) 50 MG tablet Take 1  tablet (50 mg total) by mouth every 6 (six) hours as needed. 03/08/14   Sean Hommel, DO  VAGIFEM 10 MCG TABS vaginal tablet INSERT 1 TABLET VAGINALY NIGHTLY FOR 2 WEEKS, THEN TWICE A WEEK FOR MAINTENANCE 01/06/16   Guss Bunde, MD   Meds Ordered and Administered this Visit  Medications - No data to display  BP 152/96 mmHg  Pulse 73  Temp(Src) 97.8 F (36.6 C) (Oral)  Ht 5\' 6"  (1.676 m)  Wt 185 lb (83.915 kg)  BMI 29.87 kg/m2  SpO2 98% No data found.   Physical Exam  Constitutional: She is oriented to person, place, and time. She appears well-developed and well-nourished.  HENT:  Head: Normocephalic and atraumatic.  Eyes: EOM are normal.  Neck: Normal range of motion.   Cardiovascular: Normal rate.   Pulses:      Radial pulses are 2+ on the right side.  Pulmonary/Chest: Effort normal.  Musculoskeletal: Normal range of motion. She exhibits edema and tenderness.  Right forearm: mild deformity with edema to distal aspect of forearm. Tender to touch. Full ROM wrist and fingers. Full ROM Right elbow w/o tenderness. 5/5 strength bilaterally.   Neurological: She is alert and oriented to person, place, and time.  Right hand: normal sensation compared to Left hand.   Skin: Skin is warm and dry. No erythema.  Right forearm and wrist: skin in tact. No ecchymosis, erythema, or warmth.    Psychiatric: She has a normal mood and affect. Her behavior is normal.  Nursing note and vitals reviewed.   ED Course  Procedures (including critical care time)  Labs Review Labs Reviewed - No data to display  Imaging Review Dg Forearm Right  03/22/2016  CLINICAL DATA:  Right forearm pain, no known injury, initial encounter EXAM: RIGHT FOREARM - 2 VIEW COMPARISON:  None. FINDINGS: No acute fracture is noted. No dislocation is seen. Soft tissue deformity consistent with the patient's given clinical history is noted. IMPRESSION: No acute abnormality seen. Electronically Signed   By: Inez Catalina M.D.   On: 03/22/2016 17:07   Dg Wrist Complete Right  03/22/2016  CLINICAL DATA:  Right wrist pain, no known injury, initial encounter EXAM: RIGHT WRIST - COMPLETE 3+ VIEW COMPARISON:  None. FINDINGS: No acute fracture or dislocation is seen. No gross soft tissue abnormality is noted. IMPRESSION: No acute abnormality seen. Electronically Signed   By: Inez Catalina M.D.   On: 03/22/2016 17:08      MDM   1. Pain and swelling of right wrist   2. Pain and swelling of right forearm    Pt c/o Right wrist and forearm pain and swelling since last night. No recent known injuries. She do heavy lifting at work at times but not more than usual.   No evidence of underlying infection.  Plain  films: no acute abnormality seen. Wrist splint provided.   She may continue to take acetaminophen, ibuprofen, and her tramadol. F/u with Sports Medicine or Orthopedics in 1-2 weeks if not improving.      Noland Fordyce, PA-C 03/22/16 1728

## 2016-03-22 NOTE — ED Notes (Signed)
No recent injury, pain started last night.  Hurts medial and lateral sides of right wrist and arm.  More puffy today than usual. Had injury in 6th grade, and has a deformity from it.

## 2016-03-22 NOTE — Discharge Instructions (Signed)
You may take 400-600mg  Ibuprofen (Motrin) every 6-8 hours for pain  Alternate with Tylenol  You may take 500mg  Tylenol every 4-6 hours as needed for pain   You may wear the wrist splint for added support and comfort.  You may remove during the day to use ice 15-20 minutes at a time 2-3 times a day, and/or alternate with warm compress such as a warm damp washcloth or heating pad.  If symptoms not improving in 1-2 weeks, please follow up with Sports Medicine or Orthopedics for further evaluation and treatment.  Try to avoid repetitive grasping or twisting motions with Right hand and wrist.  Limit heavy lifting as well to help allow your hand/wrist to rest.  If you develop redness, warmth, fever, or other new concerning symptoms, please seek medical attention sooner than 1-2 weeks.

## 2016-04-27 DIAGNOSIS — E559 Vitamin D deficiency, unspecified: Secondary | ICD-10-CM | POA: Diagnosis not present

## 2016-04-27 DIAGNOSIS — Z0000 Encounter for general adult medical examination without abnormal findings: Secondary | ICD-10-CM | POA: Diagnosis not present

## 2016-04-29 DIAGNOSIS — Z0000 Encounter for general adult medical examination without abnormal findings: Secondary | ICD-10-CM | POA: Diagnosis not present

## 2016-04-29 DIAGNOSIS — E785 Hyperlipidemia, unspecified: Secondary | ICD-10-CM | POA: Diagnosis not present

## 2016-04-29 DIAGNOSIS — F3341 Major depressive disorder, recurrent, in partial remission: Secondary | ICD-10-CM | POA: Diagnosis not present

## 2016-04-29 DIAGNOSIS — I10 Essential (primary) hypertension: Secondary | ICD-10-CM | POA: Diagnosis not present

## 2016-05-01 MED FILL — SIMVASTATIN 20 MG TABLET: 20 | 90 days supply | Qty: 90 | Fill #0

## 2016-05-05 DIAGNOSIS — H524 Presbyopia: Secondary | ICD-10-CM | POA: Diagnosis not present

## 2016-05-13 MED FILL — LISINOPRIL 10 MG TABLET: 10 | 90 days supply | Qty: 90 | Fill #0

## 2016-05-28 DIAGNOSIS — L820 Inflamed seborrheic keratosis: Secondary | ICD-10-CM | POA: Diagnosis not present

## 2016-05-28 DIAGNOSIS — L308 Other specified dermatitis: Secondary | ICD-10-CM | POA: Diagnosis not present

## 2016-06-11 MED FILL — METOPROLOL SUCC ER 25 MG TA: 25 | 90 days supply | Qty: 90 | Fill #0

## 2016-06-19 MED FILL — BUPROPION HCL XL 150 MG TAB: 150 | 90 days supply | Qty: 270 | Fill #0

## 2016-06-19 MED FILL — ARIPiprazole 10 MG TABS: 10 | 90 days supply | Qty: 45 | Fill #0

## 2016-07-30 MED FILL — SIMVASTATIN 20 MG TABLET: 20 | 90 days supply | Qty: 90 | Fill #1

## 2016-09-25 MED FILL — BUPROPION HCL XL 150 MG TAB: 150 | 90 days supply | Qty: 270 | Fill #1

## 2016-09-25 MED FILL — ARIPiprazole 10 MG TABS: 10 | 90 days supply | Qty: 45 | Fill #1

## 2016-10-07 ENCOUNTER — Telehealth (INDEPENDENT_AMBULATORY_CARE_PROVIDER_SITE_OTHER): Payer: Self-pay | Admitting: Radiology

## 2016-10-07 ENCOUNTER — Encounter (HOSPITAL_BASED_OUTPATIENT_CLINIC_OR_DEPARTMENT_OTHER): Payer: Self-pay | Admitting: Emergency Medicine

## 2016-10-07 ENCOUNTER — Emergency Department (HOSPITAL_BASED_OUTPATIENT_CLINIC_OR_DEPARTMENT_OTHER)
Admission: EM | Admit: 2016-10-07 | Discharge: 2016-10-07 | Disposition: A | Discharge status: Home / Self Care | Payer: 59 | Attending: Emergency Medicine | Admitting: Emergency Medicine

## 2016-10-07 ENCOUNTER — Ambulatory Visit (INDEPENDENT_AMBULATORY_CARE_PROVIDER_SITE_OTHER): Payer: 59 | Admitting: Orthopaedic Surgery

## 2016-10-07 ENCOUNTER — Emergency Department (HOSPITAL_BASED_OUTPATIENT_CLINIC_OR_DEPARTMENT_OTHER): Payer: 59

## 2016-10-07 ENCOUNTER — Encounter (INDEPENDENT_AMBULATORY_CARE_PROVIDER_SITE_OTHER): Payer: Self-pay | Admitting: Orthopaedic Surgery

## 2016-10-07 DIAGNOSIS — Z87891 Personal history of nicotine dependence: Secondary | ICD-10-CM | POA: Insufficient documentation

## 2016-10-07 DIAGNOSIS — S92911A Unspecified fracture of right toe(s), initial encounter for closed fracture: Secondary | ICD-10-CM

## 2016-10-07 DIAGNOSIS — I10 Essential (primary) hypertension: Secondary | ICD-10-CM | POA: Insufficient documentation

## 2016-10-07 DIAGNOSIS — Y999 Unspecified external cause status: Secondary | ICD-10-CM | POA: Insufficient documentation

## 2016-10-07 DIAGNOSIS — Y929 Unspecified place or not applicable: Secondary | ICD-10-CM | POA: Insufficient documentation

## 2016-10-07 DIAGNOSIS — S62616A Displaced fracture of proximal phalanx of right little finger, initial encounter for closed fracture: Secondary | ICD-10-CM | POA: Diagnosis not present

## 2016-10-07 DIAGNOSIS — S92521A Displaced fracture of medial phalanx of right lesser toe(s), initial encounter for closed fracture: Secondary | ICD-10-CM

## 2016-10-07 DIAGNOSIS — Z79899 Other long term (current) drug therapy: Secondary | ICD-10-CM | POA: Insufficient documentation

## 2016-10-07 DIAGNOSIS — S92511A Displaced fracture of proximal phalanx of right lesser toe(s), initial encounter for closed fracture: Secondary | ICD-10-CM | POA: Diagnosis not present

## 2016-10-07 DIAGNOSIS — Y939 Activity, unspecified: Secondary | ICD-10-CM | POA: Insufficient documentation

## 2016-10-07 DIAGNOSIS — S92351A Displaced fracture of fifth metatarsal bone, right foot, initial encounter for closed fracture: Secondary | ICD-10-CM | POA: Diagnosis not present

## 2016-10-07 DIAGNOSIS — X58XXXA Exposure to other specified factors, initial encounter: Secondary | ICD-10-CM | POA: Insufficient documentation

## 2016-10-07 DIAGNOSIS — S99921A Unspecified injury of right foot, initial encounter: Secondary | ICD-10-CM | POA: Diagnosis present

## 2016-10-07 HISTORY — DX: Unspecified fracture of right toe(s), initial encounter for closed fracture: S92.911A

## 2016-10-07 MED ORDER — HYDROCODONE-ACETAMINOPHEN 5-325 MG PO TABS: 1.0000 | ORAL_TABLET | Freq: Four times a day (QID) | ORAL | 0 refills | Status: DC | PRN

## 2016-10-07 MED ORDER — HYDROCODONE-ACETAMINOPHEN 5-325 MG PO TABS
1.0000 | ORAL_TABLET | Freq: Once | ORAL | Status: AC
  Administered 2016-10-07: 1 via ORAL
  Filled 2016-10-07: qty 1

## 2016-10-07 NOTE — ED Notes (Signed)
Patient transported to X-ray 

## 2016-10-07 NOTE — Telephone Encounter (Signed)
Patient lvm on triage nurse line, wanting to get appt for this week. She is s/p right little toe fx with dislocation. She states a reduction was attempted in ER but was unsuccessful and they had advised her to followup for orthopedics to evaluate. She left the message at 10:09am. I called her back and schedule appt at 2:45pm today with Dr. Lorin Mercy

## 2016-10-07 NOTE — ED Provider Notes (Signed)
[name] Note   CSN: [id number] Arrival date & time: 10/07/16  0030     History   Chief Complaint Chief Complaint  Patient presents with  . Toe Injury    HPI Mary Stark is a 60 y.o. female.  The history is provided by the patient.  Toe Pain  This is a new problem. The current episode started 1 to 2 hours ago. The problem occurs constantly. The problem has not changed since onset.The symptoms are aggravated by walking. The symptoms are relieved by rest.  patient stubbed her right little toe  She noticed a deformity and small amount of blood to toe No other injury reported  TD is utd  Past Medical History:  Diagnosis Date  . Depression 07/17/2013  . History of hepatitis C 07/17/2013  . Hyperlipidemia 07/17/2013  . Vitamin D deficiency 07/17/2013    Patient Active Problem List   Diagnosis Date Noted  . Vaginismus 04/12/2014  . Essential hypertension, benign 01/17/2014  . Menopausal state 11/08/2013  . Family history of ovarian cancer 09/27/2013  . 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 Personal history of colonic polyps 07/18/2013  . Hyperlipidemia 07/17/2013  . Depression 07/17/2013  . History of hepatitis C 07/17/2013  . Vitamin D deficiency 07/17/2013  . Right lumbar radiculopathy 07/17/2013  . History of alcohol dependence ([place]) 07/17/2013  . H/O: rheumatic fever 06/30/2012    Past Surgical History:  Procedure Laterality Date  . CESAREAN SECTION  1984  . TONSILLECTOMY      OB History    No data available       Home Medications    Prior to Admission medications   Medication Sig Start Date End Date Taking? Authorizing Provider  ARIPiprazole (ABILIFY) 2 MG tablet Take 1 tablet (2 mg total) by mouth daily. 01/04/14   Sean Hommel, DO  buPROPion (WELLBUTRIN XL) 150 MG 24 hr tablet TAKE 3 TABLETS BY MOUTH ONCE DAILY 02/15/13   Historical Provider, MD  Calcium Carbonate-Vitamin D (CALCIUM 600+D) 600-400 MG-UNIT per tablet  Take 1 tablet by mouth 2 (two) times daily.    Historical Provider, MD  clonazePAM (KLONOPIN) 0.5 MG tablet Half to full tablet by mouth as needed for sleep. 03/08/14   Sean Hommel, DO  Coenzyme Q10 (CO Q 10 PO) Take by mouth.    Historical Provider, MD  diphenhydrAMINE (BENADRYL) 25 MG tablet Take 25 mg by mouth every 6 (six) hours as needed for itching.    Historical Provider, MD  docusate sodium (COLACE) 250 MG capsule Take 250 mg by mouth daily.    Historical Provider, MD  GLUCOSAMINE HCL PO Take by mouth.    Historical Provider, MD  metoprolol tartrate (LOPRESSOR) 25 MG tablet Take 1 tablet (25 mg total) by mouth 2 (two) times daily. 01/17/14   Marcial Pacas, DO  Omega-3 Fatty Acids (FISH OIL) 1200 MG CAPS Take by mouth.    Historical Provider, MD  simvastatin (ZOCOR) 20 MG tablet Take 1 tablet (20 mg total) by mouth at bedtime. TAKE 1 TABLET BY MOUTH DAILY 03/08/14   Marcial Pacas, DO  traMADol (ULTRAM) 50 MG tablet Take 1 tablet (50 mg total) by mouth every 6 (six) hours as needed. 03/08/14   Sean Hommel, DO  VAGIFEM 10 MCG TABS vaginal tablet INSERT 1 TABLET VAGINALY NIGHTLY FOR 2 WEEKS, THEN TWICE A WEEK FOR MAINTENANCE 01/06/16   Guss Bunde, MD    Family History Family History  Problem Relation Age of Onset  .  Ovarian cancer Mother   . Bronchitis Father   . Cardiomyopathy Father     Social History Social History  Substance Use Topics  . Smoking status: Former Smoker    Quit date: 12/17/2012  . Smokeless tobacco: Never Used  . Alcohol use 3.0 oz/week    6 Standard drinks or equivalent per week     Allergies   Penicillins   Review of Systems Review of Systems  Musculoskeletal: Positive for arthralgias.  Skin: Positive for wound.     Physical Exam Updated Vital Signs BP 127/90 (BP Location: Left Arm)   Pulse 77   Temp 98 F (36.7 C) (Oral)   Resp 18   Ht 5\' 6"  (1.676 m)   Wt 70.3 kg   SpO2 99%   BMI 25.02 kg/m   Physical Exam CONSTITUTIONAL: Well  developed/well nourished HEAD: Normocephalic/atraumatic ENMT: Mucous membranes moist NECK: supple no meningeal signs LUNGS:  no apparent distress NEURO: Pt is awake/alert/appropriate, moves all extremitiesx4.  No facial droop.   EXTREMITIES: pulses normal/equal, full ROM, small abrasion to distal end of right little toe.  Nail is intact.  Right little toe has deformity with lateral displacement SKIN: warm, color normal PSYCH: no abnormalities of mood noted, alert and oriented to situation   ED Treatments / Results  Labs (all labs ordered are listed, but only abnormal results are displayed) Labs Reviewed - No data to display  EKG  EKG Interpretation None       Radiology Dg Toe 5th Right  Result Date: 10/07/2016 CLINICAL DATA:  Post reduction EXAM: RIGHT FIFTH TOE COMPARISON:  10/07/2016 FINDINGS: There is a fracture of the distal shaft of the fifth proximal phalanx with lateral angulation of distal fracture fragment and 1/4 shaft diameter of lateral displacement. There is mild dorsal displacement as well. Bony alignment does not appear significantly changed. IMPRESSION: No significant interval change in mildly displaced and angulated fracture of the distal fifth proximal phalanx. Electronically Signed   By: Donavan Foil M.D.   On: 10/07/2016 02:29   Dg Toe 5th Right  Result Date: 10/07/2016 CLINICAL DATA:  60 year old female with toe injury and deformity. EXAM: RIGHT FIFTH TOE COMPARISON:  None. FINDINGS: There is a mildly impacted oblique fracture of the mid to distal portion of the proximal phalanx of the fifth digit. There is mild lateral angulation of the distal fracture fragment with approximately 2 mm dorsal displacement. The bones are mildly osteopenic. No significant arthritic changes. The soft tissue swelling of the digit. No radiopaque foreign object. IMPRESSION: Mildly angulated and impacted fracture of the proximal phalanx of the fifth digit. Electronically Signed   By:  Anner Crete M.D.   On: 10/07/2016 01:42    Procedures Reduction of fracture Date/Time: 10/07/2016 1:30 AM Performed by: Ripley Fraise Authorized by: Ripley Fraise  Consent: Verbal consent obtained. Patient identity confirmed: verbally with patient Local anesthesia used: no  Anesthesia: Local anesthesia used: no  Sedation: Patient sedated: no Patient tolerance: Patient tolerated the procedure well with no immediate complications Comments: Patient with fracture/dislocation of 5th toe on left foot Traction/counter-traction applied Patient tolerated procedure      Medications Ordered in ED Medications  HYDROcodone-acetaminophen (NORCO/VICODIN) 5-325 MG per tablet 1 tablet (1 tablet Oral Given 10/07/16 0150)     Initial Impression / Assessment and Plan / ED Course  I have reviewed the triage vital signs and the nursing notes.  Pertinent  imaging results that were available during my care of the patient were reviewed  by me and considered in my medical decision making (see chart for details).  Clinical Course    Pt with fracture of right little toe This is a closed injury I made 2 attempts to reduce the fracture After 2nd attempt the toe was buddy taped and also she was placed in a postop shoe Pt tolerated well She did NOT want any nerve block or pain meds PRIOR to procedure She did elect to take a vicodin after the procedure There is still displacement noted However, she will call her orthopedist (DR Erlinda Hong) later today for close followup and re-exam Work note given to patient   Final Clinical Impressions(s) / ED Diagnoses   Final diagnoses:  Closed displaced fracture of phalanx of toe of right foot, unspecified toe, initial encounter    New Prescriptions New Prescriptions   HYDROCODONE-ACETAMINOPHEN (NORCO/VICODIN) 5-325 MG TABLET    Take 1 tablet by mouth every 6 (six) hours as needed.     Ripley Fraise, MD 10/07/16 [id number]

## 2016-10-07 NOTE — Progress Notes (Signed)
Office Visit Note   Patient: Mary Stark           Date of Birth: Nov 13, 1956           MRN: [id number] Visit Date: 10/07/2016              Requested by: Joanette Gula, MD [place], [place] [id number] PCP: Joanette Gula, MD   Assessment & Plan: Visit Diagnoses:  1. Closed displaced fracture of middle phalanx of lesser toe of right foot, initial encounter     Plan: Buddy tape the right fifth toe to fourth toe. 2 x 2 folded up place between the toes to improve the position alignment. She can elevate her foot for pain using ibuprofen. 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  Follow-Up Instructions: Return if symptoms worsen or fail to improve.   Orders:  No orders of the defined types were placed in this encounter.  No orders of the defined types were placed in this encounter.     Procedures: No procedures performed   Clinical Data: No additional findings.   Subjective: Chief Complaint  Patient presents with  . Right 5th Toe - Injury, Fracture    Patient was seen at [facility] this morning after injuring her toe at 10:30pm last night. She stumbled over computer tower.  Having pain right 5th toe.  She was diagnosed with a toe fracture that was displaced. Patient states they tried to "pop toe back in" twice and were unsuccessful.  The physician attempted to tape the 5th toe to the 4th but the tape will not hold.  Complaints of bleeding at time of injury and bruising now.  Patient has been using ice.  States it mostly feels like it is tingling or numb, and flip flops between burning and freezing.    Review of Systems 14 point review of systems updated with pt and reviewed.    Objective: Vital Signs: BP (!) 126/93 (BP Location: Left Arm)   Pulse 63   Ht 5\' 6"  (1.676 m)   Wt 155 lb (70.3 kg)   BMI 25.02  kg/m   Physical Exam  Constitutional: She is oriented to person, place, and time. She appears well-developed.  HENT:  Head: Normocephalic.  Right Ear: External ear normal.  Left Ear: External ear normal.  Eyes: Pupils are equal, round, and reactive to light.  Neck: No tracheal deviation present. No thyromegaly present.  Cardiovascular: Normal rate.   Pulmonary/Chest: Effort normal.  Abdominal: Soft.  Neurological: She is alert and oriented to person, place, and time.  Skin: Skin is warm and dry.  Psychiatric: She has a normal mood and affect. Her behavior is normal.    Ortho Exam fifth toe show mild swelling of the small annular angulatory deformity. Varus at the fracture of middle phalanx. This is a closed injury flexion-extension is intact is minimal ecchymosis good capillary refill. No other toe injuries. Patient has normal gait other than the slight limp due to the right fifth toe fracture. We make up the x-ray discussed options discussed the Buddy taping instructed appropriate technique. She'll call if she has any problems.  Specialty Comments:  No specialty comments available.  Imaging: Dg Toe 5th Right  Result Date: 10/07/2016 CLINICAL DATA:  Post reduction EXAM: RIGHT FIFTH TOE COMPARISON:  10/07/2016 FINDINGS: There is  a fracture of the distal shaft of the fifth proximal phalanx with lateral angulation of distal fracture fragment and 1/4 shaft diameter of lateral displacement. There is mild dorsal displacement as well. Bony alignment does not appear significantly changed. IMPRESSION: No significant interval change in mildly displaced and angulated fracture of the distal fifth proximal phalanx. Electronically Signed   By: Donavan Foil M.D.   On: 10/07/2016 02:29   Dg Toe 5th Right  Result Date: 10/07/2016 CLINICAL DATA:  60 year old female with toe injury and deformity. EXAM: RIGHT FIFTH TOE COMPARISON:  None. FINDINGS: There is a mildly impacted oblique fracture of the mid  to distal portion of the proximal phalanx of the fifth digit. There is mild lateral angulation of the distal fracture fragment with approximately 2 mm dorsal displacement. The bones are mildly osteopenic. No significant arthritic changes. The soft tissue swelling of the digit. No radiopaque foreign object. IMPRESSION: Mildly angulated and impacted fracture of the proximal phalanx of the fifth digit. Electronically Signed   By: Anner Crete M.D.   On: 10/07/2016 01:42     PMFS History: Patient Active Problem List   Diagnosis Date Noted  . Toe fracture, right 10/07/2016  . Vaginismus 04/12/2014  . Essential hypertension, benign 01/17/2014  . Menopausal state 11/08/2013  . Family history of ovarian cancer 09/27/2013  . 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 Personal history of colonic polyps 07/18/2013  . Hyperlipidemia 07/17/2013  . Depression 07/17/2013  . History of hepatitis C 07/17/2013  . Vitamin D deficiency 07/17/2013  . Right lumbar radiculopathy 07/17/2013  . History of alcohol dependence ([place]) 07/17/2013  . H/O: rheumatic fever 06/30/2012   Past Medical History:  Diagnosis Date  . Depression 07/17/2013  . History of hepatitis C 07/17/2013  . Hyperlipidemia 07/17/2013  . Vitamin D deficiency 07/17/2013    Family History  Problem Relation Age of Onset  . Ovarian cancer Mother   . Bronchitis Father   . Cardiomyopathy Father     Past Surgical History:  Procedure Laterality Date  . CESAREAN SECTION  1984  . TONSILLECTOMY     Social History   Occupational History  . Not on file.   Social History Main Topics  . Smoking status: Former Smoker    Quit date: 12/17/2012  . Smokeless tobacco: Never Used  . Alcohol use 3.0 oz/week    6 Standard drinks or equivalent per week  . Drug use: No  . Sexual activity: Yes    Birth control/ protection: None

## 2016-10-07 NOTE — ED Triage Notes (Signed)
Right small toe injury. Deformity noted.

## 2016-10-14 ENCOUNTER — Telehealth (INDEPENDENT_AMBULATORY_CARE_PROVIDER_SITE_OTHER): Payer: Self-pay | Admitting: Orthopaedic Surgery

## 2016-10-14 NOTE — Telephone Encounter (Signed)
Patient called regarding her [employer] paperwork.   Contact Info: [phone]

## 2016-10-15 DIAGNOSIS — M19071 Primary osteoarthritis, right ankle and foot: Secondary | ICD-10-CM | POA: Diagnosis not present

## 2016-10-15 DIAGNOSIS — I10 Essential (primary) hypertension: Secondary | ICD-10-CM | POA: Diagnosis not present

## 2016-10-15 DIAGNOSIS — E785 Hyperlipidemia, unspecified: Secondary | ICD-10-CM | POA: Diagnosis not present

## 2016-10-15 DIAGNOSIS — E871 Hypo-osmolality and hyponatremia: Secondary | ICD-10-CM | POA: Diagnosis not present

## 2016-10-15 DIAGNOSIS — F3341 Major depressive disorder, recurrent, in partial remission: Secondary | ICD-10-CM | POA: Diagnosis not present

## 2016-10-15 DIAGNOSIS — M79671 Pain in right foot: Secondary | ICD-10-CM | POA: Diagnosis not present

## 2016-10-16 NOTE — Telephone Encounter (Signed)
I left voicemail for patient asking her to return call.

## 2016-10-16 NOTE — Telephone Encounter (Signed)
FMLA  paperwork has been completed and faxed in

## 2016-11-23 MED FILL — LISINOPRIL 10 MG TABLET: 10 | 90 days supply | Qty: 90 | Fill #1

## 2016-11-27 MED FILL — SIMVASTATIN 20 MG TABLET: 20 | 90 days supply | Qty: 90 | Fill #0

## 2016-12-18 MED FILL — ARIPiprazole 10 MG TABS: 10 | 90 days supply | Qty: 45 | Fill #2

## 2016-12-18 MED FILL — BUPROPION HCL XL 150 MG TAB: 150 | 90 days supply | Qty: 270 | Fill #2

## 2016-12-18 MED FILL — METOPROLOL SUCC ER 25 MG TA: 25 | 90 days supply | Qty: 90 | Fill #1

## 2017-01-14 DIAGNOSIS — N3000 Acute cystitis without hematuria: Secondary | ICD-10-CM | POA: Diagnosis not present

## 2017-01-14 DIAGNOSIS — R300 Dysuria: Secondary | ICD-10-CM | POA: Diagnosis not present

## 2017-02-19 MED FILL — LISINOPRIL 10 MG TABLET: 10 | 90 days supply | Qty: 90 | Fill #2

## 2017-02-20 DIAGNOSIS — R0981 Nasal congestion: Secondary | ICD-10-CM | POA: Diagnosis not present

## 2017-02-20 DIAGNOSIS — R05 Cough: Secondary | ICD-10-CM | POA: Diagnosis not present

## 2017-02-20 DIAGNOSIS — H938X9 Other specified disorders of ear, unspecified ear: Secondary | ICD-10-CM | POA: Diagnosis not present

## 2017-02-24 DIAGNOSIS — R05 Cough: Secondary | ICD-10-CM | POA: Diagnosis not present

## 2017-03-05 MED FILL — SIMVASTATIN 20 MG TABLET: 20 | 90 days supply | Qty: 90 | Fill #1

## 2017-04-02 MED FILL — METOPROLOL SUCC ER 25 MG TA: 25 | 90 days supply | Qty: 90 | Fill #2

## 2017-04-16 MED FILL — ARIPiprazole 10 MG TABS: 10 | 90 days supply | Qty: 45 | Fill #3

## 2017-04-16 MED FILL — BUPROPION HCL XL 150 MG TAB: 150 | 90 days supply | Qty: 270 | Fill #3

## 2017-04-19 DIAGNOSIS — Z0000 Encounter for general adult medical examination without abnormal findings: Secondary | ICD-10-CM | POA: Diagnosis not present

## 2017-04-21 DIAGNOSIS — F332 Major depressive disorder, recurrent severe without psychotic features: Secondary | ICD-10-CM | POA: Diagnosis not present

## 2017-04-21 DIAGNOSIS — I10 Essential (primary) hypertension: Secondary | ICD-10-CM | POA: Diagnosis not present

## 2017-04-21 DIAGNOSIS — G894 Chronic pain syndrome: Secondary | ICD-10-CM | POA: Diagnosis not present

## 2017-04-21 DIAGNOSIS — Z0000 Encounter for general adult medical examination without abnormal findings: Secondary | ICD-10-CM | POA: Diagnosis not present

## 2017-04-21 DIAGNOSIS — E785 Hyperlipidemia, unspecified: Secondary | ICD-10-CM | POA: Diagnosis not present

## 2017-05-05 DIAGNOSIS — N898 Other specified noninflammatory disorders of vagina: Secondary | ICD-10-CM | POA: Diagnosis not present

## 2017-05-05 DIAGNOSIS — Z01419 Encounter for gynecological examination (general) (routine) without abnormal findings: Secondary | ICD-10-CM | POA: Diagnosis not present

## 2017-05-05 DIAGNOSIS — Z8041 Family history of malignant neoplasm of ovary: Secondary | ICD-10-CM | POA: Diagnosis not present

## 2017-05-05 DIAGNOSIS — N941 Unspecified dyspareunia: Secondary | ICD-10-CM | POA: Diagnosis not present

## 2017-05-05 DIAGNOSIS — R87615 Unsatisfactory cytologic smear of cervix: Secondary | ICD-10-CM | POA: Diagnosis not present

## 2017-05-07 DIAGNOSIS — R05 Cough: Secondary | ICD-10-CM | POA: Diagnosis not present

## 2017-05-07 DIAGNOSIS — J209 Acute bronchitis, unspecified: Secondary | ICD-10-CM | POA: Diagnosis not present

## 2017-05-12 DIAGNOSIS — R05 Cough: Secondary | ICD-10-CM | POA: Diagnosis not present

## 2017-05-25 MED FILL — LISINOPRIL 10 MG TABLET: 10 | 90 days supply | Qty: 90 | Fill #0

## 2017-06-02 DIAGNOSIS — R87615 Unsatisfactory cytologic smear of cervix: Secondary | ICD-10-CM | POA: Diagnosis not present

## 2017-06-02 MED FILL — YUVAFEM 10 MCG VAGINAL INSE: 10 | 84 days supply | Qty: 24 | Fill #0

## 2017-06-03 LAB — HM PAP SMEAR

## 2017-06-11 MED FILL — SIMVASTATIN 20 MG TABLET: 20 | 90 days supply | Qty: 90 | Fill #0

## 2017-06-14 MED FILL — ARIPiprazole 10 MG TABS: 10 | 30 days supply | Qty: 30 | Fill #0

## 2017-07-12 MED FILL — METOPROLOL SUCC ER 25 MG TA: 25 | 90 days supply | Qty: 90 | Fill #0

## 2017-07-19 MED FILL — ARIPiprazole 10 MG TABS: 10 | 30 days supply | Qty: 30 | Fill #0

## 2017-07-28 MED FILL — buPROPion HCL ER (XL) 150 M: 150 | 90 days supply | Qty: 270 | Fill #0

## 2017-08-27 MED FILL — ARIPiprazole 10 MG TABS: 10 | 30 days supply | Qty: 30 | Fill #1

## 2017-09-03 MED FILL — LISINOPRIL 10 MG TABLET: 10 | 90 days supply | Qty: 90 | Fill #1

## 2017-09-13 MED FILL — YUVAFEM 10 MCG TABS: 10 | 84 days supply | Qty: 24 | Fill #1

## 2017-09-17 MED FILL — SIMVASTATIN 20 MG TABLET: 20 | 90 days supply | Qty: 90 | Fill #1

## 2017-09-30 MED FILL — ARIPiprazole 10 MG TABS: 10 | 30 days supply | Qty: 30 | Fill #2

## 2017-10-22 MED FILL — METOPROLOL SUCC ER 25 MG TA: 25 | 90 days supply | Qty: 90 | Fill #1

## 2017-11-03 DIAGNOSIS — J209 Acute bronchitis, unspecified: Secondary | ICD-10-CM | POA: Diagnosis not present

## 2017-11-09 DIAGNOSIS — J209 Acute bronchitis, unspecified: Secondary | ICD-10-CM | POA: Diagnosis not present

## 2017-11-09 DIAGNOSIS — T887XXA Unspecified adverse effect of drug or medicament, initial encounter: Secondary | ICD-10-CM | POA: Diagnosis not present

## 2017-11-12 MED FILL — ARIPiprazole 10 MG TABS: 10 | 30 days supply | Qty: 30 | Fill #3

## 2017-11-12 MED FILL — BUPROPION HCL XL 150 MG TAB: 150 | 90 days supply | Qty: 270 | Fill #0

## 2017-12-10 MED FILL — LISINOPRIL 10 MG TABS: 10 | 90 days supply | Qty: 90 | Fill #2

## 2017-12-10 MED FILL — ARIPiprazole 10 MG TABS: 10 | 30 days supply | Qty: 30 | Fill #4

## 2018-01-06 ENCOUNTER — Encounter: Payer: Self-pay | Admitting: Physician Assistant

## 2018-01-06 ENCOUNTER — Ambulatory Visit (INDEPENDENT_AMBULATORY_CARE_PROVIDER_SITE_OTHER): Payer: No Typology Code available for payment source | Admitting: Physician Assistant

## 2018-01-06 VITALS — BP 117/80 | HR 84 | Ht 66.0 in | Wt 189.0 lb

## 2018-01-06 DIAGNOSIS — Z7689 Persons encountering health services in other specified circumstances: Secondary | ICD-10-CM | POA: Diagnosis not present

## 2018-01-06 DIAGNOSIS — Z8679 Personal history of other diseases of the circulatory system: Secondary | ICD-10-CM

## 2018-01-06 DIAGNOSIS — R7401 Elevation of levels of liver transaminase levels: Secondary | ICD-10-CM

## 2018-01-06 DIAGNOSIS — E6609 Other obesity due to excess calories: Secondary | ICD-10-CM

## 2018-01-06 DIAGNOSIS — I10 Essential (primary) hypertension: Secondary | ICD-10-CM

## 2018-01-06 DIAGNOSIS — Z789 Other specified health status: Secondary | ICD-10-CM

## 2018-01-06 DIAGNOSIS — Z5181 Encounter for therapeutic drug level monitoring: Secondary | ICD-10-CM | POA: Diagnosis not present

## 2018-01-06 DIAGNOSIS — E785 Hyperlipidemia, unspecified: Secondary | ICD-10-CM | POA: Diagnosis not present

## 2018-01-06 DIAGNOSIS — E66811 Obesity, class 1: Secondary | ICD-10-CM | POA: Insufficient documentation

## 2018-01-06 DIAGNOSIS — F17200 Nicotine dependence, unspecified, uncomplicated: Secondary | ICD-10-CM | POA: Insufficient documentation

## 2018-01-06 DIAGNOSIS — R740 Nonspecific elevation of levels of transaminase and lactic acid dehydrogenase [LDH]: Secondary | ICD-10-CM | POA: Diagnosis not present

## 2018-01-06 DIAGNOSIS — F17290 Nicotine dependence, other tobacco product, uncomplicated: Secondary | ICD-10-CM

## 2018-01-06 DIAGNOSIS — Z79899 Other long term (current) drug therapy: Secondary | ICD-10-CM

## 2018-01-06 DIAGNOSIS — F1090 Alcohol use, unspecified, uncomplicated: Secondary | ICD-10-CM

## 2018-01-06 DIAGNOSIS — F418 Other specified anxiety disorders: Secondary | ICD-10-CM | POA: Diagnosis not present

## 2018-01-06 HISTORY — DX: Alcohol use, unspecified, uncomplicated: F10.90

## 2018-01-06 MED ORDER — VORTIOXETINE HBR 5 MG PO TABS: 5.0000 mg | ORAL_TABLET | Freq: Every day | ORAL | 3 refills | Status: DC

## 2018-01-06 MED ORDER — ASPIRIN EC 81 MG PO TBEC: 81.0000 mg | DELAYED_RELEASE_TABLET | Freq: Every day | ORAL | 3 refills | Status: DC

## 2018-01-06 MED FILL — ASPIRIN ADULT LOW STRENGTH: 81 | 90 days supply | Qty: 90 | Fill #0

## 2018-01-06 NOTE — Patient Instructions (Addendum)
-   reduce your Wellbutrin to 300 mg daily - once you are able to fill the Trentellix, okay to stop Wellbutrin and start Trintellix - okay to continue your Abilify. Alcohol can increase serum concentrations of your Abilify so do not combine  For your blood pressure: - Goal <130/80 - baby aspirin 81 mg daily to help prevent heart attack/stroke - Limit salt to <2000 mg/day - Follow DASH eating plan - limit alcohol to 2 standard drinks per day for men and 1 per day for women - avoid tobacco products - weight loss: 7% of current body weight - follow-up every 6 months for your blood pressure

## 2018-01-06 NOTE — Progress Notes (Signed)
HPI:                                                                Mary Stark is a 62 y.o. female who presents to [place]: [facility] today to establish care  Current concerns: medication refill  This is a very pleasant 62 yo F who currently works as a Ecologist at [employer].  Depression/Anxiety: taking Wellbutrin 450 mg daily and Abilify 10 mg without difficulty. She is concerned about the decreased seizure threshold with Wellbutrin as well as the possibility that it could be increasing her anxiety.  Drinks 7-9 beers nightly. Reports she has been drinking more in the last few months. She has a history of alcohol use disorder. Cites family as a stressor. She is the primary caregiver of her 39 yo grandchild and her daughter is currently living with her. She endorses depressed mood, lack of motivation, and sleep difficulties. Denies symptoms of mania/hypomania. Denies suicidal thinking. Denies auditory/visual hallucinations. Prior meds: Zoloft (sexual dysfunction), Buspar, Klonopin  HTN: taking Lisinopril and Metoprolol daily. Compliant with medications. Denies vision change, headache, chest pain with exertion, orthopnea, lightheadedness, syncope and edema. Currently taking Phentermine and Benzphetamine through [facility] [place]. Risk factors include: HLD, age>55, tobacco use  HLD: taking Simvastatin daily. Denies myalgias, but states she takes CoQ10 to prevent muscle ache in her legs.  Depression screen [facility] 2/9 01/06/2018  Decreased Interest 1  Down, Depressed, Hopeless 3  PHQ - 2 Score 4  Altered sleeping 3  Tired, decreased energy 3  Change in appetite 0  Feeling bad or failure about yourself  0  Trouble concentrating 0  Moving slowly or fidgety/restless 0  Suicidal thoughts 0  PHQ-9 Score 10    No flowsheet data found.    Past Medical History:  Diagnosis Date  . Depression 07/17/2013  .  History of hepatitis C 07/17/2013  . History of rheumatic fever   . Hyperlipidemia 07/17/2013  . Hypertension   . Vitamin D deficiency 07/17/2013   Past Surgical History:  Procedure Laterality Date  . CESAREAN SECTION  1984  . FINGER SURGERY Right    5th  . GYNECOLOGIC CRYOSURGERY    . TONSILLECTOMY     Social History   Tobacco Use  . Smoking status: Current Some Day Smoker    Packs/day: 1.00    Years: 25.00    Pack years: 25.00    Types: Cigarettes    Last attempt to quit: 12/17/2012    Years since quitting: 5.0  . Smokeless tobacco: Never Used  Substance Use Topics  . Alcohol use: Yes   family history includes Cardiomyopathy in her father; Liver cancer in her brother; Ovarian cancer in her mother; Protein C deficiency in her brother and brother; Stroke in her brother; Valvular heart disease in her father.    ROS: negative except as noted in the HPI  Medications: Current Outpatient Medications  Medication Sig Dispense Refill  . ARIPiprazole (ABILIFY) 10 MG tablet Take 10 mg by mouth daily.  12  . buPROPion (WELLBUTRIN XL) 150 MG 24 hr tablet TAKE 3 TABLETS BY MOUTH ONCE DAILY    . lisinopril (PRINIVIL,ZESTRIL) 10 MG tablet Take 10 mg by  mouth daily.  3  . metoprolol succinate (TOPROL-XL) 25 MG 24 hr tablet Take 1 tablet by mouth daily.  3  . simvastatin (ZOCOR) 20 MG tablet Take 1 tablet (20 mg total) by mouth at bedtime. TAKE 1 TABLET BY MOUTH DAILY 30 tablet 5   No current facility-administered medications for this visit.    Allergies  Allergen Reactions  . Penicillins Hives and Rash       Objective:  BP 117/80   Pulse 84   Ht 5\' 6"  (1.676 m)   Wt 189 lb (85.7 kg)   BMI 30.51 kg/m  Gen:  alert, not ill-appearing, no distress, appropriate for age 80: head normocephalic without obvious abnormality, conjunctiva and cornea clear, wearing glasses, trachea midline Pulm: Normal work of breathing, normal phonation, clear to auscultation bilaterally, no wheezes,  rales or rhonchi CV: Normal rate, regular rhythm, s1 and s2 distinct, no murmurs, clicks or rubs; no carotid bruit; radial pulses 2+ symmetric Neuro: alert and oriented x 3 MSK: extremities atraumatic, normal gait and station Skin: intact, no rashes on exposed skin, no jaundice, no cyanosis Psych: well-groomed, cooperative, good eye contact, depressed mood, affect mood-congruent, speech is articulate, and thought processes clear and goal-directed    No results found for this or any previous visit (from the past 72 hour(s)). No results found.    Assessment and Plan: 62 y.o. female with   1. Hx of Transaminitis Lab Results  Component Value Date   ALT 12 08/30/2013   AST 15 08/30/2013   ALKPHOS 52 08/30/2013   BILITOT 0.4 08/30/2013  - Comprehensive metabolic panel  2. Essential hypertension with goal blood pressure less than 140/90 BP Readings from Last 3 Encounters:  01/06/18 117/80  10/07/16 (!) 126/93  10/07/16 127/90  - BP well controlled. Continue ACE and beta blocker - baby aspirin for primary prevention - lisinopril (PRINIVIL,ZESTRIL) 10 MG tablet; Take 10 mg by mouth daily.; Refill: 3 - metoprolol succinate (TOPROL-XL) 25 MG 24 hr tablet; Take 1 tablet by mouth daily.; Refill: 3   3. Dyslipidemia, goal LDL below 100 Lab Results  Component Value Date   LDLCALC 78 08/30/2013  - Lipid panel  4. Depression with anxiety - PHQ9 score 10, no acute safety issues - reducing Wellbutrin to 300 mg daily until she can pick up Trintellix which will likely need a PA. She will continue Abilify. Start Trintellix and stop Wellbutrin at the same time. Follow-up in 6 weeks - ARIPiprazole (ABILIFY) 10 MG tablet; Take 10 mg by mouth daily.; Refill: 12 - vortioxetine HBr (TRINTELLIX) 5 MG TABS; Take 1 tablet (5 mg total) by mouth daily.  Dispense: 30 tablet; Refill: 3  5. Heavy alcohol consumption - Comprehensive metabolic panel  6. Other tobacco product nicotine dependence,  uncomplicated   7. Encounter for monitoring statin therapy - will fill Simvastatin once I have results of CMP. Will need to closely monitor labs given heavy alcohol use and hx of hepatitis - Comprehensive metabolic panel - CBC - Lipid panel  8. Encounter to establish care - reviewed PMH, PSH, PFH, medications and allergies - reviewed health maintenance - Pap smear UTD, to be abstracted - Colonoscopy UTD per patient, requesting records - mammogram due - influenza and Tdap UTD   Patient education and anticipatory guidance given Patient agrees with treatment plan Follow-up in 6 weeks or sooner as needed if symptoms worsen or fail to improve  Darlyne Russian PA-C

## 2018-01-11 ENCOUNTER — Telehealth: Payer: Self-pay | Admitting: Physician Assistant

## 2018-01-11 ENCOUNTER — Other Ambulatory Visit: Payer: Self-pay | Admitting: Physician Assistant

## 2018-01-11 LAB — LIPID PANEL
CHOL/HDL RATIO: 2.2 ratio (ref 0.0–4.4)
Cholesterol, Total: 183 mg/dL (ref 100–199)
HDL: 83 mg/dL (ref >39)
LDL Calculated: 84 mg/dL (ref 0–99)
TRIGLYCERIDES: 79 mg/dL (ref 0–149)
VLDL Cholesterol Cal: 16 mg/dL (ref 5–40)

## 2018-01-11 LAB — CBC
Hematocrit: 39.7 % (ref 34.0–46.6)
Hemoglobin: 13.1 g/dL (ref 11.1–15.9)
MCH: 31.6 pg (ref 26.6–33.0)
MCHC: 33 g/dL (ref 31.5–35.7)
MCV: 96 fL (ref 79–97)
PLATELETS: 289 10*3/uL (ref 150–379)
RBC: 4.15 x10E6/uL (ref 3.77–5.28)
RDW: 13.3 % (ref 12.3–15.4)
WBC: 8.6 10*3/uL (ref 3.4–10.8)

## 2018-01-11 LAB — COMPREHENSIVE METABOLIC PANEL
A/G RATIO: 1.8 (ref 1.2–2.2)
ALK PHOS: 74 IU/L (ref 39–117)
ALT: 19 IU/L (ref 0–32)
AST: 21 IU/L (ref 0–40)
Albumin: 4.7 g/dL (ref 3.6–4.8)
BUN / CREAT RATIO: 14 (ref 12–28)
BUN: 14 mg/dL (ref 8–27)
Bilirubin Total: 0.3 mg/dL (ref 0.0–1.2)
CHLORIDE: 102 mmol/L (ref 96–106)
CO2: 21 mmol/L (ref 20–29)
Calcium: 9.6 mg/dL (ref 8.7–10.3)
Creatinine, Ser: 0.99 mg/dL (ref 0.57–1.00)
GFR calc Af Amer: 72 mL/min/{1.73_m2} (ref >59)
GFR calc non Af Amer: 62 mL/min/{1.73_m2} (ref >59)
GLOBULIN, TOTAL: 2.6 g/dL (ref 1.5–4.5)
Glucose: 98 mg/dL (ref 65–99)
Potassium: 4 mmol/L (ref 3.5–5.2)
SODIUM: 141 mmol/L (ref 134–144)
Total Protein: 7.3 g/dL (ref 6.0–8.5)

## 2018-01-11 MED ORDER — SIMVASTATIN 20 MG PO TABS: 20.0000 mg | ORAL_TABLET | Freq: Every day | ORAL | 2 refills | Status: DC

## 2018-01-11 MED FILL — SIMVASTATIN 20 MG TABLET: 20 | 90 days supply | Qty: 90 | Fill #0

## 2018-01-11 NOTE — Progress Notes (Signed)
Good afternoon Mary Stark,  Your labs look great. Liver enzymes are normal. Work on cutting back alcohol intake to no more than 1 standard drink per day.  I will send in your statin refill now.  Best, Evlyn Clines

## 2018-01-11 NOTE — Telephone Encounter (Signed)
Received fax for PA on Trintellix sent through cover my meds waiting on determination. - CF 

## 2018-01-12 ENCOUNTER — Encounter: Payer: Self-pay | Admitting: Physician Assistant

## 2018-01-14 MED FILL — TRINTELLIX 5 MG TABLET: 5 | 30 days supply | Qty: 30 | Fill #0

## 2018-01-14 NOTE — Telephone Encounter (Signed)
Received fax from [place] and they approved coverage on Trintellix.   PA Reference: 72 Valid: 01/13/2018 - 01/12/2019  I will notify pharmacy - CF

## 2018-01-17 ENCOUNTER — Encounter: Payer: Self-pay | Admitting: Physician Assistant

## 2018-01-17 MED FILL — ARIPiprazole 10 MG TABS: 10 | 30 days supply | Qty: 30 | Fill #5

## 2018-02-01 ENCOUNTER — Emergency Department
Admission: EM | Admit: 2018-02-01 | Discharge: 2018-02-01 | Discharge status: Absent without leave | Payer: No Typology Code available for payment source | Source: Home / Self Care

## 2018-02-02 ENCOUNTER — Encounter: Payer: Self-pay | Admitting: Physician Assistant

## 2018-02-02 ENCOUNTER — Ambulatory Visit (INDEPENDENT_AMBULATORY_CARE_PROVIDER_SITE_OTHER): Payer: No Typology Code available for payment source | Admitting: Physician Assistant

## 2018-02-02 VITALS — BP 155/93 | HR 79 | Temp 97.9°F | Wt 196.0 lb

## 2018-02-02 DIAGNOSIS — Z1231 Encounter for screening mammogram for malignant neoplasm of breast: Secondary | ICD-10-CM

## 2018-02-02 DIAGNOSIS — I10 Essential (primary) hypertension: Secondary | ICD-10-CM

## 2018-02-02 DIAGNOSIS — F418 Other specified anxiety disorders: Secondary | ICD-10-CM

## 2018-02-02 DIAGNOSIS — J22 Unspecified acute lower respiratory infection: Secondary | ICD-10-CM | POA: Diagnosis not present

## 2018-02-02 MED ORDER — LISINOPRIL 20 MG PO TABS: 20.0000 mg | ORAL_TABLET | Freq: Every day | ORAL | 1 refills | Status: DC

## 2018-02-02 MED ORDER — VORTIOXETINE HBR 10 MG PO TABS: 10.0000 mg | ORAL_TABLET | Freq: Every day | ORAL | 3 refills | Status: DC

## 2018-02-02 MED ORDER — AZITHROMYCIN 250 MG PO TABS: ORAL_TABLET | ORAL | 0 refills | Status: DC

## 2018-02-02 MED ORDER — PREDNISONE 50 MG PO TABS: 50.0000 mg | ORAL_TABLET | Freq: Every day | ORAL | 0 refills | Status: DC

## 2018-02-02 MED ORDER — ARIPIPRAZOLE 10 MG PO TABS: 10.0000 mg | ORAL_TABLET | Freq: Every day | ORAL | 1 refills | Status: DC

## 2018-02-02 MED ORDER — ALBUTEROL SULFATE HFA 108 (90 BASE) MCG/ACT IN AERS: 1.0000 | INHALATION_SPRAY | RESPIRATORY_TRACT | 1 refills | Status: DC | PRN

## 2018-02-02 MED ORDER — HYDROCODONE-HOMATROPINE 5-1.5 MG/5ML PO SYRP: 5.0000 mL | ORAL_SOLUTION | Freq: Every evening | ORAL | 0 refills | Status: DC | PRN

## 2018-02-02 MED FILL — predniSONE 50 MG TABS: 50 | 5 days supply | Qty: 5 | Fill #0

## 2018-02-02 MED FILL — AZITHROMYCIN 250 MG TABLET: 250 | 5 days supply | Qty: 6 | Fill #0

## 2018-02-02 MED FILL — HYDROCODONE-HOMATROPINE SYR: 5-1.5 | 12 days supply | Qty: 60 | Fill #0

## 2018-02-02 MED FILL — TRINTELLIX 10 MG TABLET: 10 | 30 days supply | Qty: 30 | Fill #0

## 2018-02-02 MED FILL — PROAIR HFA 90 MCG INHALER: 108 (90 BAS | 17 days supply | Qty: 9 | Fill #0

## 2018-02-02 MED FILL — LISINOPRIL 20 MG TABLET: 20 | 90 days supply | Qty: 90 | Fill #0

## 2018-02-02 NOTE — Progress Notes (Signed)
HPI:                                                                Mary Stark is a 62 y.o. female who presents to [facility]: [facility] today for cough  Cough  This is a new problem. The current episode started 1 to 4 weeks ago (x 8 days). The problem has been unchanged. The problem occurs every few minutes. The cough is non-productive. Associated symptoms include chills, ear congestion, nasal congestion, postnasal drip and wheezing (worse at night). Pertinent negatives include no chest pain, fever, hemoptysis, shortness of breath or sweats. Nothing aggravates the symptoms. Risk factors for lung disease include smoking/tobacco exposure. Treatments tried: Mucinex, Hycodan. The treatment provided mild relief. Her past medical history is significant for COPD (likely).   HTN: taking Lisinopril 10mg  daily. Compliant with medications. Checks BP's at home. BP range 69'S diastolic. Denies vision change, headache, chest pain with exertion, orthopnea, lightheadedness, syncope and edema. Risk factors include: age>55, family hx, obesity  Fatigue: has felt more fatigued over the last 3 weeks. Recently tapered off of Wellbutrin and started Trintellix 5 mg around that time. Has been home sick from work all week.   Depression screen [facility] 2/9 01/06/2018  Decreased Interest 1  Down, Depressed, Hopeless 3  PHQ - 2 Score 4  Altered sleeping 3  Tired, decreased energy 3  Change in appetite 0  Feeling bad or failure about yourself  0  Trouble concentrating 0  Moving slowly or fidgety/restless 0  Suicidal thoughts 0  PHQ-9 Score 10    No flowsheet data found.    Past Medical History:  Diagnosis Date  . Alcohol dependence ([place])   . Depression 07/17/2013  . History of hepatitis C 07/17/2013  . History of rheumatic fever   . Hyperlipidemia 07/17/2013  . Hypertension   . Tobacco use   . Toe fracture, right 10/07/2016  . Vitamin D deficiency 07/17/2013   Past  Surgical History:  Procedure Laterality Date  . CESAREAN SECTION  1984  . FINGER SURGERY Right    5th  . GYNECOLOGIC CRYOSURGERY    . TONSILLECTOMY     Social History   Tobacco Use  . Smoking status: Current Some Day Smoker    Packs/day: 1.00    Years: 25.00    Pack years: 25.00    Types: Cigarettes    Last attempt to quit: 12/17/2012    Years since quitting: 5.1  . Smokeless tobacco: Never Used  Substance Use Topics  . Alcohol use: Yes    Alcohol/week: 14.4 - 21.6 oz    Types: 24 - 36 Cans of beer per week   family history includes Cardiomyopathy in her father; Liver cancer in her brother; Ovarian cancer in her mother; Protein C deficiency in her brother and brother; Stroke in her brother; Valvular heart disease in her father.    ROS: negative except as noted in the HPI  Medications: Current Outpatient Medications  Medication Sig Dispense Refill  . ARIPiprazole (ABILIFY) 10 MG tablet Take 10 mg by mouth daily.  12  . aspirin EC 81 MG tablet Take 1 tablet (81 mg total) by mouth daily. 90 tablet 3  . Benzphetamine HCl 50 MG TABS Take  by mouth.    Marland Kitchen buPROPion (WELLBUTRIN XL) 150 MG 24 hr tablet TAKE 3 TABLETS BY MOUTH ONCE DAILY    . lisinopril (PRINIVIL,ZESTRIL) 10 MG tablet Take 10 mg by mouth daily.  3  . metoprolol succinate (TOPROL-XL) 25 MG 24 hr tablet Take 1 tablet by mouth daily.  3  . phentermine 37.5 MG capsule Take 37.5 mg by mouth every morning.    . simvastatin (ZOCOR) 20 MG tablet Take 1 tablet (20 mg total) by mouth at bedtime. TAKE 1 TABLET BY MOUTH DAILY 90 tablet 2  . vortioxetine HBr (TRINTELLIX) 5 MG TABS Take 1 tablet (5 mg total) by mouth daily. 30 tablet 3   No current facility-administered medications for this visit.    Allergies  Allergen Reactions  . Penicillins Hives and Rash       Objective:  BP (!) 155/93   Pulse 79   Temp 97.9 F (36.6 C) (Oral)   Wt 196 lb (88.9 kg)   SpO2 99%   BMI 31.64 kg/m  Gen:  alert, ill-appearing, not  toxic-appearing, no distress, appropriate for age 86: head normocephalic without obvious abnormality, conjunctiva and cornea clear, bilateral lower eyelid edema, wearing glasses, oropharynx clear, moist mucous membranes, nasal mucosa edematous, no sinus tenderness, TM's clear bilaterally, neck supple, no adenopathy, trachea midline Pulm: Normal work of breathing, normal phonation, clear to auscultation bilaterally, no wheezes, rales or rhonchi, bronchitic cough CV: Normal rate, regular rhythm, s1 and s2 distinct, no murmurs, clicks or rubs  Neuro: alert and oriented x 3, no tremor MSK: extremities atraumatic, normal gait and station Skin: intact, no rashes on exposed skin, no jaundice, no cyanosis Psych: well-groomed, cooperative, good eye contact, depressed mood, affect mood-congruent, speech is articulate, and thought processes clear and goal-directed    No results found for this or any previous visit (from the past 72 hour(s)). No results found.    Assessment and Plan: 62 y.o. female with   1. Depression with anxiety - declines PHQ9 and GAD7 today - increasing Trintellix to 10 mg. Refilling Abilify. Follow-up in 4 weeks. - vortioxetine HBr (TRINTELLIX) 10 MG TABS tablet; Take 1 tablet (10 mg total) by mouth daily.  Dispense: 30 tablet; Refill: 3 - ARIPiprazole (ABILIFY) 10 MG tablet; Take 1 tablet (10 mg total) by mouth at bedtime.  Dispense: 90 tablet; Refill: 1  2. Essential hypertension with goal blood pressure less than 140/90 BP Readings from Last 3 Encounters:  02/02/18 (!) 155/93  01/06/18 117/80  10/07/16 (!) 126/93  - BP out of range at home and in office today. Increasing Lisinopril to 20 mg. Counseled on therapeutic lifestyle changes. Follow-up in 4 weeks - lisinopril (PRINIVIL,ZESTRIL) 20 MG tablet; Take 1 tablet (20 mg total) by mouth daily.  Dispense: 90 tablet; Refill: 1  3. Acute lower respiratory infection - 8 days of non-productive cough and intermittent  wheezing. SpO2 99% on RA at rest, no fever, hemoptysis or pleuritic chest pain. Given smoking history, will cover for CAP/bacterial bronchitis with Azithromycin. Prednisone burst and bronchodilator for wheezing. Continue Mucinex during the day. Reserve cough suppressant for evenings - predniSONE (DELTASONE) 50 MG tablet; Take 1 tablet (50 mg total) by mouth daily.  Dispense: 5 tablet; Refill: 0 - azithromycin (ZITHROMAX Z-PAK) 250 MG tablet; Take 2 tablets (500 mg) on  Day 1,  followed by 1 tablet (250 mg) once daily on Days 2 through 5.  Dispense: 6 tablet; Refill: 0 - albuterol (PROVENTIL HFA;VENTOLIN HFA) 108 (90 Base) MCG/ACT  inhaler; Inhale 1-2 puffs into the lungs every 4 (four) hours as needed for wheezing or shortness of breath.  Dispense: 1 Inhaler; Refill: 1 - HYDROcodone-homatropine (HYCODAN) 5-1.5 MG/5ML syrup; Take 5 mLs by mouth at bedtime as needed for cough.  Dispense: 60 mL; Refill: 0    Patient education and anticipatory guidance given Patient agrees with treatment plan Follow-up in 4 weeks for HTN and depression or sooner as needed if symptoms worsen or fail to improve  Darlyne Russian PA-C

## 2018-02-02 NOTE — Patient Instructions (Signed)

## 2018-02-11 ENCOUNTER — Encounter: Payer: Self-pay | Admitting: Physician Assistant

## 2018-02-11 ENCOUNTER — Ambulatory Visit (INDEPENDENT_AMBULATORY_CARE_PROVIDER_SITE_OTHER): Payer: No Typology Code available for payment source | Admitting: Physician Assistant

## 2018-02-11 VITALS — BP 155/96 | HR 75 | Wt 192.0 lb

## 2018-02-11 DIAGNOSIS — F411 Generalized anxiety disorder: Secondary | ICD-10-CM

## 2018-02-11 DIAGNOSIS — F1029 Alcohol dependence with unspecified alcohol-induced disorder: Secondary | ICD-10-CM | POA: Diagnosis not present

## 2018-02-11 DIAGNOSIS — I10 Essential (primary) hypertension: Secondary | ICD-10-CM

## 2018-02-11 DIAGNOSIS — F332 Major depressive disorder, recurrent severe without psychotic features: Secondary | ICD-10-CM | POA: Insufficient documentation

## 2018-02-11 MED ORDER — BUPROPION HCL ER (XL) 150 MG PO TB24: ORAL_TABLET | ORAL | 3 refills | Status: DC

## 2018-02-11 MED ORDER — CLONAZEPAM 0.5 MG PO TABS: 0.5000 mg | ORAL_TABLET | Freq: Two times a day (BID) | ORAL | 0 refills | Status: DC | PRN

## 2018-02-11 MED ORDER — METOPROLOL SUCCINATE ER 50 MG PO TB24: 50.0000 mg | ORAL_TABLET | Freq: Every day | ORAL | 5 refills | Status: DC

## 2018-02-11 MED FILL — buPROPion HCL ER (XL) 150 M: 150 | 30 days supply | Qty: 60 | Fill #0

## 2018-02-11 MED FILL — METOPROLOL SUCCINATE ER 50: 50 | 30 days supply | Qty: 30 | Fill #0

## 2018-02-11 MED FILL — clonazePAM 0.5 MG TABS: 0.5 | 5 days supply | Qty: 10 | Fill #0

## 2018-02-11 NOTE — Progress Notes (Signed)
HPI:                                                                Mary Stark is a 62 y.o. female who presents to [place]: [facility] today for depression follow-up  This is a pleasant 62 yo F with PMH of depression, alcohol dependence, HTN, and HLD who presents for depression follow-up. Since tapering off of wellbutrin XL 450 mg about 1 month ago she has had worsening depressive symptoms including hypersomnolence, excessive guilt, and anhedonia. She is unable to work because symptoms are so debilitating. She reports she had been on Wellbutrin and Abilify for approximately 8 years. She was concerned about Wellbutrin lowering her seizure threshold with her history of alcohol use, so she requested to try an alternative medication. She has never had a seizure. She was started on Trintellix 5 mg and Abilify 10 mg was kept the same.  Last alcoholic beverage was 4 nights ago. Prior to this she was drinking 1 case of beer per week. Denies tachycardia, palpitations, diaphoresis, irritability. Denies AH/VH. Denies SI/HI.  Depression screen [facility] 2/9 02/11/2018 01/06/2018  Decreased Interest 3 1  Down, Depressed, Hopeless 3 3  PHQ - 2 Score 6 4  Altered sleeping 3 3  Tired, decreased energy 3 3  Change in appetite 2 0  Feeling bad or failure about yourself  2 0  Trouble concentrating 3 0  Moving slowly or fidgety/restless 2 0  Suicidal thoughts 0 0  PHQ-9 Score 21 10  Difficult doing work/chores Extremely dIfficult -    GAD 7 : Generalized Anxiety Score 02/11/2018  Nervous, Anxious, on Edge 1  Control/stop worrying 2  Worry too much - different things 2  Trouble relaxing 3  Restless 2  Easily annoyed or irritable 1  Afraid - awful might happen 1  Total GAD 7 Score 12  Anxiety Difficulty Very difficult      Past Medical History:  Diagnosis Date  . Alcohol dependence ([place])   . Depression 07/17/2013  . History of hepatitis C 07/17/2013  . History of  rheumatic fever   . Hyperlipidemia 07/17/2013  . Hypertension   . Tobacco use   . Toe fracture, right 10/07/2016  . Vitamin D deficiency 07/17/2013   Past Surgical History:  Procedure Laterality Date  . CESAREAN SECTION  1984  . FINGER SURGERY Right    5th  . GYNECOLOGIC CRYOSURGERY    . TONSILLECTOMY     Social History   Tobacco Use  . Smoking status: Current Some Day Smoker    Packs/day: 1.00    Years: 25.00    Pack years: 25.00    Types: Cigarettes    Last attempt to quit: 12/17/2012    Years since quitting: 5.1  . Smokeless tobacco: Never Used  . Tobacco comment: <1 cig per month  Substance Use Topics  . Alcohol use: Yes    Alcohol/week: 14.4 - 21.6 oz    Types: 24 - 36 Cans of beer per week   family history includes Cardiomyopathy in her father; Liver cancer in her brother; Ovarian cancer in her mother; Protein C deficiency in her brother and brother; Stroke in her brother; Valvular heart disease in her father.  ROS: negative except as noted in the HPI  Medications: Current Outpatient Medications  Medication Sig Dispense Refill  . albuterol (PROVENTIL HFA;VENTOLIN HFA) 108 (90 Base) MCG/ACT inhaler Inhale 1-2 puffs into the lungs every 4 (four) hours as needed for wheezing or shortness of breath. 1 Inhaler 1  . ARIPiprazole (ABILIFY) 10 MG tablet Take 1 tablet (10 mg total) by mouth at bedtime. 90 tablet 1  . aspirin EC 81 MG tablet Take 1 tablet (81 mg total) by mouth daily. 90 tablet 3  . buPROPion (WELLBUTRIN XL) 150 MG 24 hr tablet 1 tablet daily for a month then 1 tab twice a day 60 tablet 3  . clonazePAM (KLONOPIN) 0.5 MG tablet Take 1 tablet (0.5 mg total) by mouth 2 (two) times daily as needed for anxiety. 10 tablet 0  . lisinopril (PRINIVIL,ZESTRIL) 20 MG tablet Take 1 tablet (20 mg total) by mouth daily. 90 tablet 1  . metoprolol succinate (TOPROL-XL) 50 MG 24 hr tablet Take 1 tablet (50 mg total) by mouth daily. 30 tablet 5  . simvastatin (ZOCOR) 20 MG  tablet Take 1 tablet (20 mg total) by mouth at bedtime. TAKE 1 TABLET BY MOUTH DAILY 90 tablet 2  . vortioxetine HBr (TRINTELLIX) 10 MG TABS tablet Take 1 tablet (10 mg total) by mouth daily. 30 tablet 3   No current facility-administered medications for this visit.    Allergies  Allergen Reactions  . Penicillins Hives and Rash       Objective:  BP (!) 155/96   Pulse 75   Wt 192 lb (87.1 kg)   BMI 30.99 kg/m  Gen:  alert, not ill-appearing, no distress, appropriate for age, obese female HEENT: head normocephalic without obvious abnormality, conjunctiva and cornea clear, wearing glasses, trachea midline Pulm: Normal work of breathing, normal phonation, clear to auscultation bilaterally, no wheezes, rales or rhonchi CV: Normal rate, regular rhythm, s1 and s2 distinct, no murmurs, clicks or rubs  Neuro: alert and oriented x 3, no tremor MSK: extremities atraumatic, normal gait and station Skin: intact, no rashes on exposed skin, no jaundice, no cyanosis Psych: well-groomed, cooperative, good eye contact, depressed mood, tearful, affect mood-congruent, speech is articulate, and thought processes clear and goal-directed    No results found for this or any previous visit (from the past 72 hour(s)). No results found.    Assessment and Plan: 62 y.o. female with   1. Severe episode of recurrent major depressive disorder, without psychotic features ([place]) - PHQ9 score 21, no acute safety issues. Unfortunately medication change has worsened her depression. Alcohol withdrawal may be contributory. Safety planned reviewed. Due to co-morbid alcohol dependence, I am going to refer her to Psychiatry. She will continue Trintellix and Abilify and we will re-start low-dose Wellbutrin. - buPROPion (WELLBUTRIN XL) 150 MG 24 hr tablet; 1 tablet daily for a month then 1 tab twice a day  Dispense: 60 tablet; Refill: 3 - Ambulatory referral to Psychiatry  2. Essential hypertension with goal blood  pressure less than 140/90 BP Readings from Last 3 Encounters:  02/11/18 (!) 155/96  02/02/18 (!) 155/93  01/06/18 117/80   - continue Lisinopril 20 mg. Increasing Metoprolol to 50 mg - counseled on therapeutic lifestyle changes - metoprolol succinate (TOPROL-XL) 50 MG 24 hr tablet; Take 1 tablet (50 mg total) by mouth daily.  Dispense: 30 tablet; Refill: 5  3. Alcohol dependence with unspecified alcohol-induced disorder ([place]) - she is exhibiting some signs of withdrawal including decreased energy, depressed mood and  sleep disturbance. She is not having tremors, diaphoresis, delirium, nausea/vomiting, tachycardia, hallucinations - Ambulatory referral to Psychiatry  4. Anxiety state - we will avoid long-term benzodiazepine use due to risk of dependence and abuse.  - clonazePAM (KLONOPIN) 0.5 MG tablet; Take 1 tablet (0.5 mg total) by mouth 2 (two) times daily as needed for anxiety.  Dispense: 10 tablet; Refill: 0   Patient education and anticipatory guidance given Patient agrees with treatment plan Follow-up in 2 weeks for nurse BP check or sooner as needed if symptoms worsen or fail to improve  Darlyne Russian PA-C

## 2018-02-11 NOTE — Patient Instructions (Addendum)
Safety Plan: if having self-harm or suicidal thoughts Our Office [place] 1-800-SUICIDE If in immediate danger of harming yourself, go to the nearest emergency room or call 911  - continue Trintellix and Abilify  - re-start Wellbutrin 150 mg daily - follow-up with Psychiatry  For your blood pressure: - Goal <130/80 - increase Metoprolol to 50 mg daily - continue Lisinopril - baby aspirin 81 mg daily to help prevent heart attack/stroke - monitor and log blood pressures at home - check around the same time each day in a relaxed setting - Limit salt to <2000 mg/day - Follow DASH eating plan - limit alcohol to 2 standard drinks per day for men and 1 per day for women - avoid tobacco products - weight loss: 7% of current body weight - follow-up every 6 months for your blood pressure

## 2018-02-17 ENCOUNTER — Ambulatory Visit: Payer: Self-pay | Admitting: Physician Assistant

## 2018-02-21 ENCOUNTER — Telehealth: Payer: Self-pay

## 2018-02-21 MED FILL — ARIPiprazole 10 MG TABS: 10 | 30 days supply | Qty: 30 | Fill #6

## 2018-02-21 NOTE — Telephone Encounter (Signed)
Pt is taking both Wellbutrin and Trintellix.  She reports that she had been vomiting since Saturday.  She thought maybe she had an 24 hour stomach bug but her Sx has been consistent since.  She wants to know could the vomiting be coming from taking both meds at the same time. Please advise. -EH/RMA

## 2018-02-21 NOTE — Telephone Encounter (Signed)
Pt notified of recommendations listed below. -EH/RMA

## 2018-02-21 NOTE — Telephone Encounter (Signed)
It's not common, but it is possible. Would recommend she taper off of her Trintellix since this medication never worked for controlling her mood symptoms - take 5 mg for 4 days, then stop Continue wellbutrin

## 2018-03-02 ENCOUNTER — Ambulatory Visit (INDEPENDENT_AMBULATORY_CARE_PROVIDER_SITE_OTHER): Payer: No Typology Code available for payment source | Admitting: Physician Assistant

## 2018-03-02 ENCOUNTER — Encounter: Payer: Self-pay | Admitting: Physician Assistant

## 2018-03-02 ENCOUNTER — Other Ambulatory Visit: Payer: Self-pay

## 2018-03-02 VITALS — BP 114/78 | HR 79 | Wt 192.0 lb

## 2018-03-02 DIAGNOSIS — I10 Essential (primary) hypertension: Secondary | ICD-10-CM

## 2018-03-02 DIAGNOSIS — H538 Other visual disturbances: Secondary | ICD-10-CM | POA: Diagnosis not present

## 2018-03-02 DIAGNOSIS — Z85828 Personal history of other malignant neoplasm of skin: Secondary | ICD-10-CM

## 2018-03-02 DIAGNOSIS — F3341 Major depressive disorder, recurrent, in partial remission: Secondary | ICD-10-CM

## 2018-03-02 DIAGNOSIS — L989 Disorder of the skin and subcutaneous tissue, unspecified: Secondary | ICD-10-CM

## 2018-03-02 HISTORY — DX: Personal history of other malignant neoplasm of skin: Z85.828

## 2018-03-02 MED ORDER — BUPROPION HCL ER (XL) 300 MG PO TB24: 300.0000 mg | ORAL_TABLET | Freq: Every day | ORAL | 1 refills | Status: DC

## 2018-03-02 NOTE — Patient Instructions (Addendum)
Call [phone] or go downstairs to imaging to schedule your mammogram

## 2018-03-02 NOTE — Progress Notes (Signed)
HPI:                                                                Mary Stark is a 62 y.o. female who presents to [facility]: [place] today for mood disorder follow-up  Depression/Anxiety: taking Wellbutrin 300 mg and Abilify 10mg  without difficulty. Reports she is doing much better since stopping Trintellix and switching back to Wellbutrin. She does not feel like she needs the 450 mg dose that she was on prior to the medication change. She was able to return back to work last week. She has an appointment with psychiatry scheduled in May. Denies symptoms of mania/hypomania. Denies suicidal thinking. Denies auditory/visual hallucinations.  Alcohol use disorder: she is drinking 3-4 alcoholic beverages 3-4 nights per week. She only drinks on her days off. She is working on cutting back.  HTN: taking Lisinopril 20 mg and Toprol XL 50 mg daily. Metoprolol was increased at last visit. Compliant with medications. Reports BP is <130/80 at home. Denies headache, chest pain with exertion, orthopnea, lightheadedness, syncope and edema. Risk factors include: tobacco use, age>55, family hx, HLD  Depression screen [facility] 2/9 03/02/2018 02/11/2018 01/06/2018  Decreased Interest 0 3 1  Down, Depressed, Hopeless 0 3 3  PHQ - 2 Score 0 6 4  Altered sleeping 1 3 3   Tired, decreased energy 0 3 3  Change in appetite 1 2 0  Feeling bad or failure about yourself  0 2 0  Trouble concentrating 0 3 0  Moving slowly or fidgety/restless 0 2 0  Suicidal thoughts 0 0 0  PHQ-9 Score 2 21 10   Difficult doing work/chores Not difficult at all Extremely dIfficult -    GAD 7 : Generalized Anxiety Score 03/02/2018 02/11/2018  Nervous, Anxious, on Edge 0 1  Control/stop worrying 0 2  Worry too much - different things 0 2  Trouble relaxing 0 3  Restless 0 2  Easily annoyed or irritable 0 1  Afraid - awful might happen 0 1  Total GAD 7 Score 0 12  Anxiety Difficulty Not difficult at  all Very difficult      Past Medical History:  Diagnosis Date  . Alcohol dependence ([place])   . Depression 07/17/2013  . History of hepatitis C 07/17/2013  . History of rheumatic fever   . Hyperlipidemia 07/17/2013  . Hypertension   . Tobacco use   . Toe fracture, right 10/07/2016  . Vitamin D deficiency 07/17/2013   Past Surgical History:  Procedure Laterality Date  . CESAREAN SECTION  1984  . FINGER SURGERY Right    5th  . GYNECOLOGIC CRYOSURGERY    . TONSILLECTOMY     Social History   Tobacco Use  . Smoking status: Current Some Day Smoker    Packs/day: 1.00    Years: 25.00    Pack years: 25.00    Types: Cigarettes    Last attempt to quit: 12/17/2012    Years since quitting: 5.2  . Smokeless tobacco: Never Used  . Tobacco comment: <1 cig per month  Substance Use Topics  . Alcohol use: Yes    Alcohol/week: 14.4 - 21.6 oz    Types: 24 - 36 Cans of beer per week   family history  includes Cardiomyopathy in her father; Liver cancer in her brother; Ovarian cancer in her mother; Protein C deficiency in her brother and brother; Stroke in her brother; Valvular heart disease in her father.    ROS: negative except as noted in the HPI  Medications: Current Outpatient Medications  Medication Sig Dispense Refill  . albuterol (PROVENTIL HFA;VENTOLIN HFA) 108 (90 Base) MCG/ACT inhaler Inhale 1-2 puffs into the lungs every 4 (four) hours as needed for wheezing or shortness of breath. 1 Inhaler 1  . ARIPiprazole (ABILIFY) 10 MG tablet Take 1 tablet (10 mg total) by mouth at bedtime. 90 tablet 1  . aspirin EC 81 MG tablet Take 1 tablet (81 mg total) by mouth daily. 90 tablet 3  . lisinopril (PRINIVIL,ZESTRIL) 20 MG tablet Take 1 tablet (20 mg total) by mouth daily. 90 tablet 1  . metoprolol succinate (TOPROL-XL) 50 MG 24 hr tablet Take 1 tablet (50 mg total) by mouth daily. 30 tablet 5  . simvastatin (ZOCOR) 20 MG tablet Take 1 tablet (20 mg total) by mouth at bedtime. TAKE 1 TABLET BY  MOUTH DAILY 90 tablet 2  . buPROPion (WELLBUTRIN XL) 300 MG 24 hr tablet Take 1 tablet (300 mg total) by mouth daily. 90 tablet 1   No current facility-administered medications for this visit.    Allergies  Allergen Reactions  . Penicillins Hives and Rash       Objective:  BP 114/78   Pulse 79   Wt 192 lb (87.1 kg)   BMI 30.99 kg/m  Gen:  alert, not ill-appearing, no distress, appropriate for age, obese female HEENT: head normocephalic without obvious abnormality, conjunctiva and cornea clear, wearing glasses, trachea midline Pulm: Normal work of breathing, normal phonation, clear to auscultation bilaterally, no wheezes, rales or rhonchi CV: Normal rate, regular rhythm, s1 and s2 distinct, no murmurs, clicks or rubs  Neuro: alert and oriented x 3, no tremor MSK: extremities atraumatic, normal gait and station Skin: intact, scaly, pearly papule measuring approx. 0.5 cm on the left upper chest wall Psych: well-groomed, cooperative, good eye contact, euthymic mood, affect mood-congruent, speech is articulate, and thought processes clear and goal-directed    No results found for this or any previous visit (from the past 72 hour(s)). No results found.    Assessment and Plan: 62 y.o. female with   1. Recurrent major depressive disorder, in partial remission (HCC) - PHQ9=2, significant improvement since returning to Wellbutrin. She did not tolerate the switch to Trintellix and had a severe major depressive episode in which she could not work. May have been a combination of alcohol withdrawal as well. Either way, we discussed that she will continue Wellbutrin 300 mg and Abilify 10 mg and establish care with Psychiatry. I would prefer Psychiatry handle any medication changes  2. Blurred vision, bilateral - Ambulatory referral to Ophthalmology  3. History of nonmelanoma skin cancer - recommend annual head-to-toe skin exam with dermatologist. Patient declines  4. Skin lesion of  chest wall - recommend return for shave biopsy  5. Hypertension BP Readings from Last 3 Encounters:  03/02/18 114/78  02/11/18 (!) 155/96  02/02/18 (!) 155/93  - BP in range today. Doing well on increased Metoprolol from 25 to 50 mg - continue daily meds  Patient education and anticipatory guidance given Patient agrees with treatment plan Follow-up in 1 week for shave biopsy or sooner as needed if symptoms worsen or fail to improve  Darlyne Russian PA-C

## 2018-03-09 ENCOUNTER — Ambulatory Visit (INDEPENDENT_AMBULATORY_CARE_PROVIDER_SITE_OTHER): Payer: No Typology Code available for payment source

## 2018-03-09 ENCOUNTER — Ambulatory Visit (INDEPENDENT_AMBULATORY_CARE_PROVIDER_SITE_OTHER): Payer: No Typology Code available for payment source | Admitting: Physician Assistant

## 2018-03-09 ENCOUNTER — Encounter: Payer: Self-pay | Admitting: Physician Assistant

## 2018-03-09 VITALS — BP 150/89 | HR 72 | Wt 192.0 lb

## 2018-03-09 DIAGNOSIS — Z1231 Encounter for screening mammogram for malignant neoplasm of breast: Secondary | ICD-10-CM | POA: Diagnosis not present

## 2018-03-09 DIAGNOSIS — L989 Disorder of the skin and subcutaneous tissue, unspecified: Secondary | ICD-10-CM

## 2018-03-09 DIAGNOSIS — M7592 Shoulder lesion, unspecified, left shoulder: Secondary | ICD-10-CM

## 2018-03-09 NOTE — Progress Notes (Signed)
Shave Biopsy Procedure Note   Pre-operative Diagnosis: Suspicious lesion  Post-operative Diagnosis: same  Locations:left, anterior shoulder  Indications: personal history of skin cancer  Anesthesia: Lidocaine 1% with epinephrine without added sodium bicarbonate  Procedure Details  History of allergy to iodine: no  Patient informed of the risks (including bleeding and infection) and benefits of the  procedure and Verbal informed consent obtained.  The lesion and surrounding area were given a sterile prep using chlorhexidine and draped in the usual sterile fashion. A scalpel was used to shave an area of skin approximately 47mm by 85mm.  Hemostasis achieved with pressure and silver nitrate. Antibiotic ointment and a sterile dressing applied.  The specimen was sent for pathologic examination. The patient tolerated the procedure well.   Condition: Stable  Complications: none.  Plan: 1. Instructed to keep the wound dry and covered for 24-48h and clean thereafter. 2. Warning signs of infection were reviewed.   3. Recommended that the patient use NSAID as needed for pain.  4. Return in as needed if symptoms worsen or fail to improve.  Patient education and anticipatory guidance given Patient agrees with treatment plan   Darlyne Russian PA-C

## 2018-03-09 NOTE — Patient Instructions (Signed)
Skin Biopsy, Care After Refer to this sheet in the next few weeks. These instructions provide you with information about caring for yourself after your procedure. Your health care provider may also give you more specific instructions. Your treatment has been planned according to current medical practices, but problems sometimes occur. Call your health care provider if you have any problems or questions after your procedure. What can I expect after the procedure? After the procedure, it is common to have:  Soreness.  Bruising.  Itching.  Follow these instructions at home:  Rest and then return to your normal activities as told by your health care provider.  Take over-the-counter and prescription medicines only as told by your health care provider.  Follow instructions from your health care provider about how to take care of your biopsy site.Make sure you: ? Wash your hands with soap and water before you change your bandage (dressing). If soap and water are not available, use hand sanitizer. ? Change your dressing as told by your health care provider. ? Leave stitches (sutures), skin glue, or adhesive strips in place. These skin closures may need to stay in place for 2 weeks or longer. If adhesive strip edges start to loosen and curl up, you may trim the loose edges. Do not remove adhesive strips completely unless your health care provider tells you to do that. If the biopsy area bleeds, apply gentle pressure for 10 minutes.  Check your biopsy site every day for signs of infection. Check for: ? More redness, swelling, or pain. ? More fluid or blood. ? Warmth. ? Pus or a bad smell.  Keep all follow-up visits as told by your health care provider. This is important. Contact a health care provider if:  You have more redness, swelling, or pain around your biopsy site.  You have more fluid or blood coming from your biopsy site.  Your biopsy site feels warm to the touch.  You have pus or  a bad smell coming from your biopsy site.  You have a fever. Get help right away if:  You have bleeding that does not stop with pressure or a dressing. This information is not intended to replace advice given to you by your health care provider. Make sure you discuss any questions you have with your health care provider. Document Released: 12/27/2015 Document Revised: 07/26/2016 Document Reviewed: 02/27/2015 Elsevier Interactive Patient Education  2018 [employer]  

## 2018-03-09 NOTE — Progress Notes (Signed)
Mary Stark,  Your mammogram was negative. Recommend repeat screening in 1 year.   Have a good rest of your week! Mary Stark

## 2018-03-11 MED FILL — METOPROLOL SUCCINATE ER 50: 50 | 30 days supply | Qty: 30 | Fill #1

## 2018-03-14 DIAGNOSIS — Z8719 Personal history of other diseases of the digestive system: Secondary | ICD-10-CM

## 2018-03-14 HISTORY — DX: Personal history of other diseases of the digestive system: Z87.19

## 2018-03-15 ENCOUNTER — Encounter: Payer: Self-pay | Admitting: Physician Assistant

## 2018-03-15 DIAGNOSIS — L821 Other seborrheic keratosis: Secondary | ICD-10-CM | POA: Insufficient documentation

## 2018-03-15 NOTE — Progress Notes (Signed)
Good morning Mary Stark,  Your skin biopsy showed a benign seborrheic keratosis. Continue to wear broad spectrum sunscreen with at least SPF 30 and limit direct exposure to UV rays. Recommend annual skin exam with dermatology due to your history of skin cancer.  Best, Evlyn Clines

## 2018-03-25 MED FILL — ARIPiprazole 10 MG TABS: 10 | 30 days supply | Qty: 30 | Fill #7

## 2018-03-30 ENCOUNTER — Ambulatory Visit (INDEPENDENT_AMBULATORY_CARE_PROVIDER_SITE_OTHER): Payer: No Typology Code available for payment source | Admitting: Physician Assistant

## 2018-03-30 ENCOUNTER — Encounter: Payer: Self-pay | Admitting: Physician Assistant

## 2018-03-30 ENCOUNTER — Other Ambulatory Visit: Payer: Self-pay | Admitting: Physician Assistant

## 2018-03-30 VITALS — BP 139/84 | HR 83 | Ht 66.0 in | Wt 191.9 lb

## 2018-03-30 DIAGNOSIS — I10 Essential (primary) hypertension: Secondary | ICD-10-CM | POA: Diagnosis not present

## 2018-03-30 DIAGNOSIS — E6609 Other obesity due to excess calories: Secondary | ICD-10-CM

## 2018-03-30 DIAGNOSIS — Z791 Long term (current) use of non-steroidal anti-inflammatories (NSAID): Secondary | ICD-10-CM | POA: Diagnosis not present

## 2018-03-30 DIAGNOSIS — Z0000 Encounter for general adult medical examination without abnormal findings: Secondary | ICD-10-CM | POA: Diagnosis not present

## 2018-03-30 DIAGNOSIS — E66811 Obesity, class 1: Secondary | ICD-10-CM

## 2018-03-30 DIAGNOSIS — S4991XA Unspecified injury of right shoulder and upper arm, initial encounter: Secondary | ICD-10-CM

## 2018-03-30 DIAGNOSIS — F418 Other specified anxiety disorders: Secondary | ICD-10-CM | POA: Diagnosis not present

## 2018-03-30 DIAGNOSIS — Z713 Dietary counseling and surveillance: Secondary | ICD-10-CM

## 2018-03-30 DIAGNOSIS — M87021 Idiopathic aseptic necrosis of right humerus: Secondary | ICD-10-CM

## 2018-03-30 DIAGNOSIS — Z7189 Other specified counseling: Secondary | ICD-10-CM | POA: Insufficient documentation

## 2018-03-30 MED ORDER — METOPROLOL SUCCINATE ER 50 MG PO TB24: 50.0000 mg | ORAL_TABLET | Freq: Every day | ORAL | 1 refills | Status: DC

## 2018-03-30 MED ORDER — METFORMIN HCL 500 MG PO TABS: ORAL_TABLET | ORAL | 1 refills | Status: DC

## 2018-03-30 MED ORDER — MELOXICAM 15 MG PO TABS: 15.0000 mg | ORAL_TABLET | Freq: Every day | ORAL | 0 refills | Status: DC | PRN

## 2018-03-30 MED ORDER — OMEPRAZOLE 20 MG PO CPDR: 20.0000 mg | DELAYED_RELEASE_CAPSULE | Freq: Every day | ORAL | 1 refills | Status: DC

## 2018-03-30 MED ORDER — CYCLOBENZAPRINE HCL 5 MG PO TABS: 5.0000 mg | ORAL_TABLET | Freq: Every day | ORAL | 1 refills | Status: DC

## 2018-03-30 MED ORDER — PREDNISONE 20 MG PO TABS: 40.0000 mg | ORAL_TABLET | Freq: Every day | ORAL | 0 refills | Status: AC

## 2018-03-30 MED ORDER — ARIPIPRAZOLE 10 MG PO TABS: 10.0000 mg | ORAL_TABLET | Freq: Every day | ORAL | 1 refills | Status: DC

## 2018-03-30 MED FILL — predniSONE 20 MG TABS: 20 | 5 days supply | Qty: 10 | Fill #0

## 2018-03-30 MED FILL — MELOXICAM 15 MG TABLET: 15 | 90 days supply | Qty: 90 | Fill #0

## 2018-03-30 MED FILL — CYCLOBENZAPRINE 5 MG TABLET: 5 | 15 days supply | Qty: 30 | Fill #0

## 2018-03-30 MED FILL — metFORMIN HCL 500 MG TABS: 500 | 30 days supply | Qty: 60 | Fill #0

## 2018-03-30 MED FILL — OMEPRAZOLE 20 MG CAP: 20 | 90 days supply | Qty: 90 | Fill #0

## 2018-03-30 NOTE — Progress Notes (Signed)
HPI:                                                                Mary Stark is a 62 y.o. female who presents to [place]: [place] today for annual physical exam  Current concerns include: medication refills, R shoulder pain   Reports re-injuring right shoulder on Saturday while attempting to catch a curling iron and abruptly hyperextending her arm. This is the same shoulder that she already has rotator cuff problems. History of nondisplaced fracture of her right porximal humerus in 2016 and rotator cuff injury. She has been treated with steroid injection and PT in the past. She is followed by Dr. Sherrian Divers at [place], who recommended arthroscopic shoulder repair. She has increased her Meloxicam to 15 mg daily without significant relief. Pain is worse laying on the right side at night.  Depression screen [facility] 2/9 03/02/2018 02/11/2018 01/06/2018  Decreased Interest 0 3 1  Down, Depressed, Hopeless 0 3 3  PHQ - 2 Score 0 6 4  Altered sleeping 1 3 3   Tired, decreased energy 0 3 3  Change in appetite 1 2 0  Feeling bad or failure about yourself  0 2 0  Trouble concentrating 0 3 0  Moving slowly or fidgety/restless 0 2 0  Suicidal thoughts 0 0 0  PHQ-9 Score 2 21 10   Difficult doing work/chores Not difficult at all Extremely dIfficult -    GAD 7 : Generalized Anxiety Score 03/02/2018 02/11/2018  Nervous, Anxious, on Edge 0 1  Control/stop worrying 0 2  Worry too much - different things 0 2  Trouble relaxing 0 3  Restless 0 2  Easily annoyed or irritable 0 1  Afraid - awful might happen 0 1  Total GAD 7 Score 0 12  Anxiety Difficulty Not difficult at all Very difficult      Past Medical History:  Diagnosis Date  . Alcohol dependence ([place])   . Depression 07/17/2013  . History of hepatitis C 07/17/2013  . History of rheumatic fever   . Hyperlipidemia 07/17/2013  . Hypertension   . Tobacco use   . Toe fracture, right 10/07/2016  .  Vitamin D deficiency 07/17/2013   Past Surgical History:  Procedure Laterality Date  . CESAREAN SECTION  1984  . FINGER SURGERY Right    5th  . GYNECOLOGIC CRYOSURGERY    . TONSILLECTOMY     Social History   Tobacco Use  . Smoking status: Current Some Day Smoker    Packs/day: 1.00    Years: 25.00    Pack years: 25.00    Types: Cigarettes    Last attempt to quit: 12/17/2012    Years since quitting: 5.2  . Smokeless tobacco: Never Used  . Tobacco comment: <1 cig per month  Substance Use Topics  . Alcohol use: Yes    Alcohol/week: 14.4 - 21.6 oz    Types: 24 - 36 Cans of beer per week   family history includes Cardiomyopathy in her father; Liver cancer in her brother; Ovarian cancer in her mother; Protein C deficiency in her brother and brother; Stroke in her brother; Valvular heart disease in her father.    ROS: negative except as noted in the HPI  Medications: Current Outpatient Medications  Medication Sig Dispense Refill  . Estradiol 10 MCG TABS vaginal tablet Place vaginally.    . meloxicam (MOBIC) 15 MG tablet Take 1 tablet (15 mg total) by mouth daily as needed for pain. 90 tablet 0  . albuterol (PROVENTIL HFA;VENTOLIN HFA) 108 (90 Base) MCG/ACT inhaler Inhale 1-2 puffs into the lungs every 4 (four) hours as needed for wheezing or shortness of breath. 1 Inhaler 1  . ARIPiprazole (ABILIFY) 10 MG tablet Take 1 tablet (10 mg total) by mouth at bedtime. 90 tablet 1  . aspirin EC 81 MG tablet Take 1 tablet (81 mg total) by mouth daily. 90 tablet 3  . buPROPion (WELLBUTRIN XL) 300 MG 24 hr tablet Take 1 tablet (300 mg total) by mouth daily. 90 tablet 1  . cyclobenzaprine (FLEXERIL) 5 MG tablet Take 1-2 tablets (5-10 mg total) by mouth at bedtime. 30 tablet 1  . lisinopril (PRINIVIL,ZESTRIL) 20 MG tablet Take 1 tablet (20 mg total) by mouth daily. 90 tablet 1  . metoprolol succinate (TOPROL-XL) 50 MG 24 hr tablet Take 1 tablet (50 mg total) by mouth daily. 90 tablet 1  .  omeprazole (PRILOSEC) 20 MG capsule Take 1 capsule (20 mg total) by mouth daily. 90 capsule 1  . predniSONE (DELTASONE) 20 MG tablet Take 2 tablets (40 mg total) by mouth daily with breakfast for 5 days. 10 tablet 0  . simvastatin (ZOCOR) 20 MG tablet Take 1 tablet (20 mg total) by mouth at bedtime. TAKE 1 TABLET BY MOUTH DAILY 90 tablet 2   No current facility-administered medications for this visit.    Allergies  Allergen Reactions  . Penicillins Hives and Rash       Objective:  BP 139/84   Pulse 83   Ht 5\' 6"  (1.676 m)   Wt 191 lb 14.4 oz (87 kg)   SpO2 98%   BMI 30.97 kg/m  General Appearance:  Alert, cooperative, no distress, appropriate for age, obese female                            Head:  Normocephalic, without obvious abnormality                             Eyes:  PERRL, EOM's intact, conjunctiva and cornea clear, wearing glasses                             Ears:  TM pearly gray color and semitransparent right and slightly retracted on the left, external ear canals normal, both ears                            Nose:  Nares symmetrical                          Throat:  Lips, tongue, and mucosa are moist, pink, and intact; good dentition                             Neck:  Supple; symmetrical, trachea midline, no adenopathy; thyroid: no enlargement, symmetric, no tenderness/mass/nodules, no carotid bruit  Back:  Symmetrical, no curvature, ROM normal               Chest/Breast:  deferred                           Lungs:  Clear to auscultation bilaterally, respirations unlabored                             Heart:  regular rate & normal rhythm, S1 and S2 normal, no murmurs, rubs, or gallops                     Abdomen:  Soft, non-tender, no mass or organomegaly              Genitourinary:  deferred         Musculoskeletal:  Extremities atraumatic, normal gait and station Right shoulder: should atraumatic, there is crepitus, no a/c joint tenderness,  positive Hawkin's and Neer's, limited ROM with flexion and external rotation                       Lymphatic:  No adenopathy             Skin/Hair/Nails:  Skin warm, dry and intact, no rashes or abnormal dyspigmentation on limited exam                   Neurologic:  Alert and oriented x3, no cranial nerve deficits, DTR's intact, sensation grossly intact, normal gait and station, no tremor Psych: well-groomed, cooperative, good eye contact, euthymic mood, affect mood-congruent, speech is articulate, and thought processes clear and goal-directed  MR Shoulder R w/o 05/22/2015 1. Moderate supraspinatus and biceps tendinopathy, with mild infraspinatus tendinopathy. No full-thickness rotator cuff tear noted. 2. Trace subacromial subdeltoid bursitis. 3. Moderate degenerative chondral thinning in the glenohumeral joint. 4. Suspicion for a small focus of chronic avascular necrosis along the upper articular margin of the humeral head.   No results found for this or any previous visit (from the past 72 hour(s)). No results found.    Assessment and Plan: 62 y.o. female with   Encounter for annual physical exam - Personally reviewed PMH, PSH, PFH, medications, allergies, HM - Age-appropriate cancer screening: colonoscopy, mammo and Pap UTD - Influenza n/a - Tdap UTD - Shingrix on backorder  Right shoulder injury - personally reviewed MRI from 2016. history of bursitis, rotator cuff tendonopathy and chronic avascular necrosis of the humeral head. Symptoms are concerning for acute tear of the rotator cuff - conservative management with steroid burst and formal rehab - given her alcohol use and longterm NSAID use, I recommend PPI prophyalxis - follow-up with Sports Med in 1 month  Obesity, Class 1 w/ comorbidity - counseled on weight loss through decreased caloric intake and increased aerobic exercise - 1500-1700 calorie moderate protein diet  - I think a lot of her weight gain is associated  with Abilify and mood disorder. For this reason, adding Metformin. Follow-up in 1 month for weight check  Encounter for annual physical exam  Essential hypertension with goal blood pressure less than 140/90 - Plan: metoprolol succinate (TOPROL-XL) 50 MG 24 hr tablet  Depression with anxiety - Plan: ARIPiprazole (ABILIFY) 10 MG tablet  Injury of right shoulder, initial encounter - Plan: meloxicam (MOBIC) 15 MG tablet, Ambulatory referral to Physical Therapy, predniSONE (DELTASONE) 20 MG tablet, cyclobenzaprine (FLEXERIL) 5 MG tablet  Avascular necrosis of humeral head, right (HCC)  NSAID long-term use - Plan: omeprazole (PRILOSEC) 20 MG capsule  Encounter for weight loss counseling  Class 1 obesity due to excess calories with serious comorbidity in adult, unspecified BMI     Patient education and anticipatory guidance given Patient agrees with treatment plan Follow-up in 1 year for CPE and every 6 months for medication management or sooner as needed if symptoms worsen or fail to improve  Darlyne Russian PA-C

## 2018-03-30 NOTE — Patient Instructions (Addendum)
WEIGHT LOSS PLAN Weight Goal: 145 Lb Weight Loss Goal: 0.5 - 1 Lb per week Start Metformin 500 mg with supper for 1 week. Then increase to 500 mg with breakfast and supper -1500-1700 calorie moderate protein diet - 30-35% calories from protein - 30% calories from fat - 35-40% from carbs: If diabetic, limit carbs to 30 g per meal and 15 g per snack - MEASURE your calories (MyFitnessPal, LoseIt app of some sort) - 8-11 glasses of water per day - limit caffeine to 1 unsweetened beverage per day - avoid fried foods, saturated fat, and heavily processed foods - keep sugar as low as possible  - follow DASH or Mediterranean diets for recipes - avoid skipping meals. Eat 3 regular meals per day with 1-2 snacks (stay within your calorie goal) OR graze every 4 hours. The important thing is to stay on a schedule - avoid eating within 2 hours of bedtime  For shoulder - steroid burst for 5 days, do not combine with Meloxicam or NSAIDs - Meloxicam once daily as needed - start Omeprazole today to prevent peptic ulcer - flexeril at bedtime as needed - 4 weeks of formal PT - follow-up with Sports Med in 1 month

## 2018-04-07 ENCOUNTER — Observation Stay (HOSPITAL_COMMUNITY)
Admission: EM | Admit: 2018-04-07 | Discharge: 2018-04-10 | Disposition: A | Discharge status: Home / Self Care | Payer: No Typology Code available for payment source | Attending: Internal Medicine | Admitting: Internal Medicine

## 2018-04-07 ENCOUNTER — Encounter (HOSPITAL_COMMUNITY): Payer: Self-pay

## 2018-04-07 ENCOUNTER — Other Ambulatory Visit: Payer: Self-pay

## 2018-04-07 ENCOUNTER — Emergency Department (HOSPITAL_COMMUNITY): Payer: No Typology Code available for payment source

## 2018-04-07 ENCOUNTER — Ambulatory Visit: Payer: Self-pay | Admitting: Rehabilitative and Restorative Service Providers"

## 2018-04-07 ENCOUNTER — Emergency Department
Admission: EM | Admit: 2018-04-07 | Discharge: 2018-04-07 | Disposition: A | Discharge status: Home / Self Care | Payer: No Typology Code available for payment source | Source: Home / Self Care | Attending: Emergency Medicine | Admitting: Emergency Medicine

## 2018-04-07 DIAGNOSIS — R1031 Right lower quadrant pain: Secondary | ICD-10-CM | POA: Diagnosis not present

## 2018-04-07 DIAGNOSIS — Z880 Allergy status to penicillin: Secondary | ICD-10-CM | POA: Insufficient documentation

## 2018-04-07 DIAGNOSIS — K7689 Other specified diseases of liver: Secondary | ICD-10-CM | POA: Insufficient documentation

## 2018-04-07 DIAGNOSIS — I700 Atherosclerosis of aorta: Secondary | ICD-10-CM | POA: Diagnosis not present

## 2018-04-07 DIAGNOSIS — I10 Essential (primary) hypertension: Secondary | ICD-10-CM | POA: Insufficient documentation

## 2018-04-07 DIAGNOSIS — N179 Acute kidney failure, unspecified: Secondary | ICD-10-CM | POA: Diagnosis not present

## 2018-04-07 DIAGNOSIS — F329 Major depressive disorder, single episode, unspecified: Secondary | ICD-10-CM | POA: Diagnosis not present

## 2018-04-07 DIAGNOSIS — A419 Sepsis, unspecified organism: Secondary | ICD-10-CM | POA: Diagnosis not present

## 2018-04-07 DIAGNOSIS — E6609 Other obesity due to excess calories: Secondary | ICD-10-CM | POA: Diagnosis present

## 2018-04-07 DIAGNOSIS — Z79899 Other long term (current) drug therapy: Secondary | ICD-10-CM | POA: Diagnosis not present

## 2018-04-07 DIAGNOSIS — F1021 Alcohol dependence, in remission: Secondary | ICD-10-CM | POA: Diagnosis not present

## 2018-04-07 DIAGNOSIS — F1020 Alcohol dependence, uncomplicated: Secondary | ICD-10-CM | POA: Insufficient documentation

## 2018-04-07 DIAGNOSIS — R1032 Left lower quadrant pain: Secondary | ICD-10-CM

## 2018-04-07 DIAGNOSIS — Z7982 Long term (current) use of aspirin: Secondary | ICD-10-CM | POA: Diagnosis not present

## 2018-04-07 DIAGNOSIS — F17210 Nicotine dependence, cigarettes, uncomplicated: Secondary | ICD-10-CM | POA: Diagnosis not present

## 2018-04-07 DIAGNOSIS — B1920 Unspecified viral hepatitis C without hepatic coma: Secondary | ICD-10-CM | POA: Diagnosis not present

## 2018-04-07 DIAGNOSIS — E559 Vitamin D deficiency, unspecified: Secondary | ICD-10-CM | POA: Insufficient documentation

## 2018-04-07 DIAGNOSIS — K922 Gastrointestinal hemorrhage, unspecified: Secondary | ICD-10-CM

## 2018-04-07 DIAGNOSIS — F32A Depression, unspecified: Secondary | ICD-10-CM | POA: Diagnosis present

## 2018-04-07 DIAGNOSIS — F332 Major depressive disorder, recurrent severe without psychotic features: Secondary | ICD-10-CM | POA: Diagnosis present

## 2018-04-07 DIAGNOSIS — K529 Noninfective gastroenteritis and colitis, unspecified: Secondary | ICD-10-CM | POA: Diagnosis not present

## 2018-04-07 DIAGNOSIS — I959 Hypotension, unspecified: Secondary | ICD-10-CM

## 2018-04-07 DIAGNOSIS — Z720 Tobacco use: Secondary | ICD-10-CM | POA: Diagnosis present

## 2018-04-07 DIAGNOSIS — E785 Hyperlipidemia, unspecified: Secondary | ICD-10-CM | POA: Diagnosis not present

## 2018-04-07 DIAGNOSIS — E66811 Obesity, class 1: Secondary | ICD-10-CM | POA: Diagnosis present

## 2018-04-07 LAB — POCT CBC W AUTO DIFF (K'VILLE URGENT CARE)

## 2018-04-07 LAB — COMPREHENSIVE METABOLIC PANEL
ALK PHOS: 47 U/L (ref 38–126)
ALT: 17 U/L (ref 14–54)
ANION GAP: 10 (ref 5–15)
AST: 18 U/L (ref 15–41)
Albumin: 3.8 g/dL (ref 3.5–5.0)
BILIRUBIN TOTAL: 0.8 mg/dL (ref 0.3–1.2)
BUN: 34 mg/dL — ABNORMAL HIGH (ref 6–20)
CALCIUM: 8.7 mg/dL — AB (ref 8.9–10.3)
CO2: 22 mmol/L (ref 22–32)
Chloride: 99 mmol/L — ABNORMAL LOW (ref 101–111)
Creatinine, Ser: 1.65 mg/dL — ABNORMAL HIGH (ref 0.44–1.00)
GFR calc non Af Amer: 33 mL/min — ABNORMAL LOW (ref >60)
GFR, EST AFRICAN AMERICAN: 38 mL/min — AB (ref >60)
Glucose, Bld: 132 mg/dL — ABNORMAL HIGH (ref 65–99)
Potassium: 4.2 mmol/L (ref 3.5–5.1)
SODIUM: 131 mmol/L — AB (ref 135–145)
TOTAL PROTEIN: 6.8 g/dL (ref 6.5–8.1)

## 2018-04-07 LAB — TYPE AND SCREEN
ABO/RH(D): A POS
ANTIBODY SCREEN: NEGATIVE

## 2018-04-07 LAB — POC OCCULT BLOOD, ED: FECAL OCCULT BLD: POSITIVE — AB

## 2018-04-07 LAB — I-STAT CG4 LACTIC ACID, ED: Lactic Acid, Venous: 1.59 mmol/L (ref 0.5–1.9)

## 2018-04-07 LAB — PROTIME-INR
INR: 1.01
PROTHROMBIN TIME: 13.2 s (ref 11.4–15.2)

## 2018-04-07 LAB — CBC
HCT: 39 % (ref 36.0–46.0)
HCT: 36.6 % (ref 36.0–46.0)
HEMOGLOBIN: 11.9 g/dL — AB (ref 12.0–15.0)
Hemoglobin: 12.9 g/dL (ref 12.0–15.0)
MCH: 31.6 pg (ref 26.0–34.0)
MCH: 31.4 pg (ref 26.0–34.0)
MCHC: 33.1 g/dL (ref 30.0–36.0)
MCHC: 32.5 g/dL (ref 30.0–36.0)
MCV: 95.6 fL (ref 78.0–100.0)
MCV: 96.6 fL (ref 78.0–100.0)
PLATELETS: 225 10*3/uL (ref 150–400)
Platelets: 247 10*3/uL (ref 150–400)
RBC: 4.08 MIL/uL (ref 3.87–5.11)
RBC: 3.79 MIL/uL — AB (ref 3.87–5.11)
RDW: 12.6 % (ref 11.5–15.5)
RDW: 13 % (ref 11.5–15.5)
WBC: 12.5 10*3/uL — ABNORMAL HIGH (ref 4.0–10.5)
WBC: 9.8 10*3/uL (ref 4.0–10.5)

## 2018-04-07 LAB — APTT: aPTT: 30 seconds (ref 24–36)

## 2018-04-07 MED ORDER — IOPAMIDOL (ISOVUE-300) INJECTION 61%
80.0000 mL | Freq: Once | INTRAVENOUS | Status: AC | PRN
  Administered 2018-04-07: 80 mL via INTRAVENOUS

## 2018-04-07 MED ORDER — MORPHINE SULFATE (PF) 2 MG/ML IV SOLN: 2.0000 mg | INTRAVENOUS | Status: DC | PRN

## 2018-04-07 MED ORDER — PANTOPRAZOLE SODIUM 40 MG PO TBEC
40.0000 mg | DELAYED_RELEASE_TABLET | Freq: Every day | ORAL | Status: DC
  Administered 2018-04-07 – 2018-04-10 (×4): 40 mg via ORAL
  Filled 2018-04-07 (×4): qty 1

## 2018-04-07 MED ORDER — MORPHINE SULFATE (PF) 4 MG/ML IV SOLN
2.0000 mg | INTRAVENOUS | Status: DC | PRN
  Administered 2018-04-07 – 2018-04-09 (×6): 2 mg via INTRAVENOUS
  Filled 2018-04-07 (×6): qty 1

## 2018-04-07 MED ORDER — VITAMIN B-12 100 MCG PO TABS
100.0000 ug | ORAL_TABLET | Freq: Every day | ORAL | Status: DC
  Administered 2018-04-07 – 2018-04-10 (×4): 100 ug via ORAL
  Filled 2018-04-07 (×4): qty 1

## 2018-04-07 MED ORDER — LORAZEPAM 2 MG/ML IJ SOLN: 0.0000 mg | Freq: Four times a day (QID) | INTRAMUSCULAR | Status: AC

## 2018-04-07 MED ORDER — METRONIDAZOLE IN NACL 5-0.79 MG/ML-% IV SOLN
500.0000 mg | Freq: Three times a day (TID) | INTRAVENOUS | Status: DC
  Administered 2018-04-07 – 2018-04-10 (×7): 500 mg via INTRAVENOUS
  Filled 2018-04-07 (×7): qty 100

## 2018-04-07 MED ORDER — VITAMIN E 45 MG (100 UNIT) PO CAPS
100.0000 [IU] | ORAL_CAPSULE | Freq: Every day | ORAL | Status: DC
  Administered 2018-04-07 – 2018-04-10 (×4): 100 [IU] via ORAL
  Filled 2018-04-07 (×4): qty 1

## 2018-04-07 MED ORDER — CALCIUM 500 MG PO TABS: 1.0000 | ORAL_TABLET | Freq: Every day | ORAL | Status: DC

## 2018-04-07 MED ORDER — ONDANSETRON HCL 4 MG PO TABS: 4.0000 mg | ORAL_TABLET | Freq: Four times a day (QID) | ORAL | Status: DC | PRN

## 2018-04-07 MED ORDER — ACETAMINOPHEN 650 MG RE SUPP: 650.0000 mg | Freq: Four times a day (QID) | RECTAL | Status: DC | PRN

## 2018-04-07 MED ORDER — LORAZEPAM 1 MG PO TABS: 1.0000 mg | ORAL_TABLET | Freq: Four times a day (QID) | ORAL | Status: DC | PRN

## 2018-04-07 MED ORDER — BUPROPION HCL ER (XL) 150 MG PO TB24
300.0000 mg | ORAL_TABLET | Freq: Every day | ORAL | Status: DC
  Administered 2018-04-07 – 2018-04-10 (×4): 300 mg via ORAL
  Filled 2018-04-07 (×4): qty 2

## 2018-04-07 MED ORDER — SODIUM CHLORIDE 0.9 % IV BOLUS
1000.0000 mL | Freq: Once | INTRAVENOUS | Status: AC
  Administered 2018-04-07: 1000 mL via INTRAVENOUS

## 2018-04-07 MED ORDER — CYCLOBENZAPRINE HCL 10 MG PO TABS
5.0000 mg | ORAL_TABLET | Freq: Every day | ORAL | Status: DC
  Administered 2018-04-08 – 2018-04-09 (×2): 10 mg via ORAL
  Filled 2018-04-07 (×3): qty 1

## 2018-04-07 MED ORDER — IOPAMIDOL (ISOVUE-300) INJECTION 61%
INTRAVENOUS | Status: AC
  Filled 2018-04-07: qty 100

## 2018-04-07 MED ORDER — HYDRALAZINE HCL 20 MG/ML IJ SOLN: 5.0000 mg | INTRAMUSCULAR | Status: DC | PRN

## 2018-04-07 MED ORDER — MORPHINE SULFATE (PF) 2 MG/ML IV SOLN
2.0000 mg | Freq: Once | INTRAVENOUS | Status: AC
  Administered 2018-04-07: 2 mg via INTRAVENOUS
  Filled 2018-04-07: qty 1

## 2018-04-07 MED ORDER — SIMVASTATIN 10 MG PO TABS
20.0000 mg | ORAL_TABLET | Freq: Every day | ORAL | Status: DC
  Administered 2018-04-07 – 2018-04-09 (×3): 20 mg via ORAL
  Filled 2018-04-07: qty 1
  Filled 2018-04-07: qty 2
  Filled 2018-04-07: qty 1
  Filled 2018-04-07: qty 2

## 2018-04-07 MED ORDER — THIAMINE HCL 100 MG/ML IJ SOLN: 100.0000 mg | Freq: Every day | INTRAMUSCULAR | Status: DC

## 2018-04-07 MED ORDER — LORAZEPAM 2 MG/ML IJ SOLN: 1.0000 mg | Freq: Four times a day (QID) | INTRAMUSCULAR | Status: DC | PRN

## 2018-04-07 MED ORDER — VITAMIN C 500 MG PO TABS
500.0000 mg | ORAL_TABLET | Freq: Every day | ORAL | Status: DC
  Administered 2018-04-08 – 2018-04-10 (×3): 500 mg via ORAL
  Filled 2018-04-07 (×3): qty 1

## 2018-04-07 MED ORDER — ZOLPIDEM TARTRATE 5 MG PO TABS
5.0000 mg | ORAL_TABLET | Freq: Every evening | ORAL | Status: DC | PRN
  Administered 2018-04-07: 5 mg via ORAL
  Filled 2018-04-07 (×2): qty 1

## 2018-04-07 MED ORDER — CIPROFLOXACIN IN D5W 400 MG/200ML IV SOLN
400.0000 mg | Freq: Two times a day (BID) | INTRAVENOUS | Status: DC
  Administered 2018-04-08 – 2018-04-10 (×5): 400 mg via INTRAVENOUS
  Filled 2018-04-07 (×6): qty 200

## 2018-04-07 MED ORDER — FOLIC ACID 1 MG PO TABS
1.0000 mg | ORAL_TABLET | Freq: Every day | ORAL | Status: DC
  Administered 2018-04-08 – 2018-04-10 (×3): 1 mg via ORAL
  Filled 2018-04-07 (×3): qty 1

## 2018-04-07 MED ORDER — SODIUM CHLORIDE 0.9 % IV BOLUS
1500.0000 mL | Freq: Once | INTRAVENOUS | Status: AC
  Administered 2018-04-07: 1500 mL via INTRAVENOUS

## 2018-04-07 MED ORDER — CIPROFLOXACIN IN D5W 400 MG/200ML IV SOLN
400.0000 mg | Freq: Once | INTRAVENOUS | Status: AC
  Administered 2018-04-07: 400 mg via INTRAVENOUS
  Filled 2018-04-07: qty 200

## 2018-04-07 MED ORDER — GLUCOSAMINE-CHONDROITIN 250-200 MG PO CAPS: 1.0000 | ORAL_CAPSULE | Freq: Every day | ORAL | Status: DC

## 2018-04-07 MED ORDER — ADULT MULTIVITAMIN W/MINERALS CH
1.0000 | ORAL_TABLET | Freq: Every day | ORAL | Status: DC
  Administered 2018-04-08 – 2018-04-10 (×3): 1 via ORAL
  Filled 2018-04-07 (×3): qty 1

## 2018-04-07 MED ORDER — ACETAMINOPHEN 325 MG PO TABS: 650.0000 mg | ORAL_TABLET | Freq: Four times a day (QID) | ORAL | Status: DC | PRN

## 2018-04-07 MED ORDER — ONDANSETRON HCL 4 MG/2ML IJ SOLN
4.0000 mg | Freq: Four times a day (QID) | INTRAMUSCULAR | Status: DC | PRN
  Administered 2018-04-08: 4 mg via INTRAVENOUS
  Filled 2018-04-07: qty 2

## 2018-04-07 MED ORDER — LORAZEPAM 2 MG/ML IJ SOLN: 0.0000 mg | Freq: Two times a day (BID) | INTRAMUSCULAR | Status: DC

## 2018-04-07 MED ORDER — OMEGA-3-ACID ETHYL ESTERS 1 G PO CAPS
1.0000 g | ORAL_CAPSULE | Freq: Every day | ORAL | Status: DC
  Administered 2018-04-07 – 2018-04-10 (×4): 1 g via ORAL
  Filled 2018-04-07 (×4): qty 1

## 2018-04-07 MED ORDER — METRONIDAZOLE IN NACL 5-0.79 MG/ML-% IV SOLN
500.0000 mg | Freq: Once | INTRAVENOUS | Status: AC
  Administered 2018-04-09: 500 mg via INTRAVENOUS
  Filled 2018-04-07: qty 100

## 2018-04-07 MED ORDER — SODIUM CHLORIDE 0.9 % IV SOLN
INTRAVENOUS | Status: DC
  Administered 2018-04-07: 23:00:00 via INTRAVENOUS

## 2018-04-07 MED ORDER — ARIPIPRAZOLE 10 MG PO TABS
10.0000 mg | ORAL_TABLET | Freq: Every day | ORAL | Status: DC
  Administered 2018-04-07 – 2018-04-09 (×3): 10 mg via ORAL
  Filled 2018-04-07 (×3): qty 1

## 2018-04-07 MED ORDER — VITAMIN B-1 100 MG PO TABS
100.0000 mg | ORAL_TABLET | Freq: Every day | ORAL | Status: DC
  Administered 2018-04-08 – 2018-04-10 (×3): 100 mg via ORAL
  Filled 2018-04-07 (×3): qty 1

## 2018-04-07 MED ORDER — NICOTINE 21 MG/24HR TD PT24
21.0000 mg | MEDICATED_PATCH | Freq: Every day | TRANSDERMAL | Status: DC
  Administered 2018-04-08 – 2018-04-10 (×3): 21 mg via TRANSDERMAL
  Filled 2018-04-07 (×3): qty 1

## 2018-04-07 MED ORDER — CALCIUM CARBONATE 1250 (500 CA) MG PO TABS
1.0000 | ORAL_TABLET | Freq: Every day | ORAL | Status: DC
  Administered 2018-04-08 – 2018-04-10 (×3): 500 mg via ORAL
  Filled 2018-04-07 (×3): qty 1

## 2018-04-07 NOTE — ED Notes (Signed)
Pt dizzy while standing for OSVS

## 2018-04-07 NOTE — ED Notes (Signed)
Lab reports adding on Protime-INR and APTT to previous lab draw.

## 2018-04-07 NOTE — ED Provider Notes (Signed)
[facility] DEPT Provider Note   CSN: [id number] Arrival date & time: 04/07/18  1424     History   Chief Complaint Chief Complaint  Patient presents with  . Rectal Bleeding    HPI Mary Stark is a 62 y.o. female.  HPI   Mary Stark is a 62 y.o. female, with a history of alcohol dependence, hepatitis C,, presenting to the ED with rectal bleeding. Approximately 1:30 AM noticed blood in stool.  Endorses 8-9 stools since onset. Last BM was purely bright red blood at 9:30AM.  No noted bleeding or stool since then.  Endorses nausea and fatigue.  Patient originally had lower abdominal cramping that has since resolved. 600mg  ibuprofen 2-3 times a day for last 8 months due to shoulder pain. About 12 beers weekly. Vapes.  No sick contacts. No recent travel.  No recent antibiotics.  No recent hospitalizations.  No cough, fever, hematuria, shortness of breath, chest pain, vomiting, vaginal bleeding, or any other complaints.   Past Medical History:  Diagnosis Date  . Alcohol dependence ([place])   . Depression 07/17/2013  . History of hepatitis C 07/17/2013  . History of rheumatic fever   . Hyperlipidemia 07/17/2013  . Hypertension   . Tobacco use   . Toe fracture, right 10/07/2016  . Vitamin D deficiency 07/17/2013    Patient Active Problem List   Diagnosis Date Noted  . Depression 04/07/2018  . AKI (acute kidney injury) ([place]) 04/07/2018  . Sepsis ([place]) 04/07/2018  . Acute colitis 04/07/2018  . Avascular necrosis of humeral head, right ([place]) 03/30/2018  . NSAID long-term use 03/30/2018  . Encounter for weight loss counseling 03/30/2018  . Seborrheic keratoses 03/15/2018  . History of nonmelanoma skin cancer 03/02/2018  . Skin lesion of chest wall 03/02/2018  . Alcohol dependence with unspecified alcohol-induced disorder ([place]) 02/11/2018  . Severe episode of recurrent major depressive disorder, without psychotic features ([place]) 02/11/2018  .  Transaminitis 01/06/2018  . Nicotine dependence 01/06/2018  . Heavy alcohol consumption 01/06/2018  . Encounter for monitoring statin therapy 01/06/2018  . Class 1 obesity due to excess calories with serious comorbidity in adult 01/06/2018  . Chronic pain syndrome 04/21/2017  . Chronic right shoulder pain 07/03/2015  . Vaginismus 04/12/2014  . Essential hypertension with goal blood pressure less than 140/90 01/17/2014  . Menopausal state 11/08/2013  . Family history of ovarian cancer 09/27/2013  . 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 Personal history of colonic polyps 07/18/2013  . Dyslipidemia, goal LDL below 100 07/17/2013  . Depression with anxiety 07/17/2013  . Hx of hepatitis C 07/17/2013  . Vitamin D deficiency 07/17/2013  . Right lumbar radiculopathy 07/17/2013  . History of alcohol dependence ([place]) 07/17/2013  . H/O: rheumatic fever 06/30/2012  . Insomnia 06/30/2012    Past Surgical History:  Procedure Laterality Date  . CESAREAN SECTION  1984  . FINGER SURGERY Right    5th  . GYNECOLOGIC CRYOSURGERY    . TONSILLECTOMY       OB History   None      Home Medications    Prior to Admission medications   Medication Sig Start Date End Date Taking? Authorizing Provider  ARIPiprazole (ABILIFY) 10 MG tablet Take 1 tablet (10 mg total) by mouth at bedtime. 03/30/18  Yes Trixie Dredge, PA-C  Ascorbic Acid (VITAMIN C PO) Take 1 tablet by mouth daily.   Yes [provider]  aspirin EC 81 MG tablet Take 1 tablet (81  mg total) by mouth daily. 01/06/18  Yes Trixie Dredge, PA-C  buPROPion (WELLBUTRIN XL) 300 MG 24 hr tablet Take 1 tablet (300 mg total) by mouth daily. 03/02/18  Yes Trixie Dredge, PA-C  CALCIUM PO Take 1 tablet by mouth daily.   Yes [provider]  Cyanocobalamin (VITAMIN B 12 PO) Take 1 tablet by mouth daily.   Yes [provider]  cyclobenzaprine (FLEXERIL) 5 MG tablet Take 1-2  tablets (5-10 mg total) by mouth at bedtime. 03/30/18  Yes Trixie Dredge, PA-C  Estradiol 10 MCG TABS vaginal tablet Place vaginally.   Yes [provider]  GLUCOSAMINE-CHONDROITIN PO Take 1 tablet by mouth daily.   Yes [provider]  lisinopril (PRINIVIL,ZESTRIL) 20 MG tablet Take 1 tablet (20 mg total) by mouth daily. 02/02/18  Yes Trixie Dredge, PA-C  meloxicam (MOBIC) 15 MG tablet Take 1 tablet (15 mg total) by mouth daily as needed for pain. 03/30/18  Yes Trixie Dredge, PA-C  metFORMIN (GLUCOPHAGE) 500 MG tablet 500 mg PO nightly with supper for 1 week, then 500 mg PO bid with meals 03/30/18  Yes Trixie Dredge, PA-C  metoprolol succinate (TOPROL-XL) 50 MG 24 hr tablet Take 1 tablet (50 mg total) by mouth daily. 03/30/18  Yes Trixie Dredge, PA-C  Omega-3 Fatty Acids (FISH OIL PO) Take 1 tablet by mouth daily.   Yes [provider]  omeprazole (PRILOSEC) 20 MG capsule Take 1 capsule (20 mg total) by mouth daily. 03/30/18  Yes Trixie Dredge, PA-C  simvastatin (ZOCOR) 20 MG tablet Take 1 tablet (20 mg total) by mouth at bedtime. TAKE 1 TABLET BY MOUTH DAILY 01/11/18  Yes Trixie Dredge, [place]  VITAMIN E PO Take 1 tablet by mouth daily.   Yes [provider]  albuterol (PROVENTIL HFA;VENTOLIN HFA) 108 (90 Base) MCG/ACT inhaler Inhale 1-2 puffs into the lungs every 4 (four) hours as needed for wheezing or shortness of breath. Patient not taking: Reported on 04/07/2018 02/02/18   Trixie Dredge, PA-C    Family History Family History  Problem Relation Age of Onset  . Ovarian cancer Mother   . Cardiomyopathy Father   . Valvular heart disease Father   . Liver cancer Brother   . Protein C deficiency Brother   . Protein C deficiency Brother   . Stroke Brother     Social History Social History   Tobacco Use  . Smoking status: Current Some Day Smoker     Packs/day: 1.00    Years: 25.00    Pack years: 25.00    Types: Cigarettes    Last attempt to quit: 12/17/2012    Years since quitting: 5.3  . Smokeless tobacco: Never Used  . Tobacco comment: <1 cig per month  Substance Use Topics  . Alcohol use: Yes    Alcohol/week: 14.4 - 21.6 oz    Types: 24 - 36 Cans of beer per week  . Drug use: No     Allergies   Penicillins   Review of Systems Review of Systems  Constitutional: Positive for fatigue. Negative for chills, diaphoresis and fever.  Respiratory: Negative for cough and shortness of breath.   Cardiovascular: Negative for chest pain.  Gastrointestinal: Positive for abdominal pain, blood in stool, diarrhea and nausea. Negative for vomiting.  Neurological: Positive for weakness (generalized).  All other systems reviewed and are negative.    Physical Exam Updated Vital Signs BP 101/68   Pulse 80  Temp (!) 97.5 F (36.4 C) (Oral)   Resp 16   Ht 5\' 6"  (1.676 m)   Wt 84.8 kg (187 lb)   SpO2 100%   BMI 30.18 kg/m   Physical Exam  Constitutional: She appears well-developed and well-nourished. No distress.  HENT:  Head: Normocephalic and atraumatic.  Eyes: Conjunctivae are normal.  Neck: Neck supple.  Cardiovascular: Normal rate, regular rhythm, normal heart sounds and intact distal pulses.  Pulmonary/Chest: Effort normal and breath sounds normal. No respiratory distress.  Abdominal: Soft. Bowel sounds are increased. There is tenderness in the right lower quadrant, periumbilical area, suprapubic area and left lower quadrant. There is no guarding.  Tenderness across the lower abdomen, worse in the left lower quadrant.  Musculoskeletal: She exhibits no edema.  Lymphadenopathy:    She has no cervical adenopathy.  Neurological: She is alert.  Skin: Skin is warm and dry. She is not diaphoretic.  Psychiatric: She has a normal mood and affect. Her behavior is normal.  Nursing note and vitals reviewed.    ED Treatments /  Results  Labs (all labs ordered are listed, but only abnormal results are displayed) Labs Reviewed  COMPREHENSIVE METABOLIC PANEL - Abnormal; Notable for the following components:      Result Value   Sodium 131 (*)    Chloride 99 (*)    Glucose, Bld 132 (*)    BUN 34 (*)    Creatinine, Ser 1.65 (*)    Calcium 8.7 (*)    GFR calc non Af Amer 33 (*)    GFR calc Af Amer 38 (*)    All other components within normal limits  CBC - Abnormal; Notable for the following components:   WBC 12.5 (*)    All other components within normal limits  POC OCCULT BLOOD, ED - Abnormal; Notable for the following components:   Fecal Occult Bld POSITIVE (*)    All other components within normal limits  GASTROINTESTINAL PANEL BY PCR, STOOL (REPLACES STOOL CULTURE)  C DIFFICILE QUICK SCREEN W PCR REFLEX  PROTIME-INR  APTT  I-STAT CG4 LACTIC ACID, ED  TYPE AND SCREEN  ABO/RH    EKG None  Radiology Ct Abdomen Pelvis W Contrast  Result Date: 04/07/2018 CLINICAL DATA:  Pt was flush and felt hot last night. Then was clammy, and had massive stomach cramps. Diffuse severe lower abdominal pain especially left lower quadrant, 8 out of 10 intensity. Bowel movement last night had bright red blood. Week, BB near syncopal. Pain and nausea. EXAM: CT ABDOMEN AND PELVIS WITH CONTRAST TECHNIQUE: Multidetector CT imaging of the abdomen and pelvis was performed using the standard protocol following bolus administration of intravenous contrast. CONTRAST:  59mL ISOVUE-300 IOPAMIDOL (ISOVUE-300) INJECTION 61% COMPARISON:  None. FINDINGS: Lower chest: No acute abnormality. Hepatobiliary: Small liver cyst is identified in the LEFT hepatic lobe. No suspicious liver lesion. Gallbladder is present. Pancreas: Unremarkable. No pancreatic ductal dilatation or surrounding inflammatory changes. Spleen: Normal in size without focal abnormality. Adrenals/Urinary Tract: Adrenal glands are unremarkable. Kidneys are normal, without renal  calculi, focal lesion, or hydronephrosis. Bladder is unremarkable. Stomach/Bowel: The stomach and small bowel loops are normal in appearance. The appendix is well seen and has a normal appearance. There is diffusely thickened wall involving the distal transverse, descending, and sigmoid colon. Pericolonic stranding is consistent with inflammatory changes and/or edema. No discrete mass. No evidence for perforation or abscess. Minimal diverticular disease. Vascular/Lymphatic: There is mild atherosclerotic calcification of the abdominal aorta not associated with aneurysm.  No retroperitoneal or mesenteric adenopathy. Reproductive: The uterus is present.  No adnexal mass. Other: There is a small amount of free pelvic fluid. Anterior abdominal wall is unremarkable. Musculoskeletal: Degenerative changes are seen in the lumbar spine, with disc height loss and vacuum disc noted at L4-5 and L5-S1. No suspicious lytic or blastic lesions are identified. IMPRESSION: 1. Colonic wall thickening consistent with acute colitis. Considerations include infectious or inflammatory causes, significantly more likely than ischemic causes given the distribution. Findings are atypical for acute diverticulitis. There is minimal diverticulosis. 2.  Aortic atherosclerosis.  (ICD10-I70.0) 3. Normal appendix. 4. Small amount of free pelvic fluid. 5. Benign LEFT liver cyst. Electronically Signed   By: Nolon Nations M.D.   On: 04/07/2018 18:50    Procedures Procedures (including critical care time)  Medications Ordered in ED Medications  ciprofloxacin (CIPRO) IVPB 400 mg (has no administration in time range)    And  metroNIDAZOLE (FLAGYL) IVPB 500 mg (has no administration in time range)  sodium chloride 0.9 % bolus 1,500 mL (has no administration in time range)  0.9 %  sodium chloride infusion (has no administration in time range)  sodium chloride 0.9 % bolus 1,000 mL (1,000 mLs Intravenous New Bag/Given 04/07/18 1900)  iopamidol  (ISOVUE-300) 61 % injection 80 mL (80 mLs Intravenous Contrast Given 04/07/18 1829)     Initial Impression / Assessment and Plan / ED Course  I have reviewed the triage vital signs and the nursing notes.  Pertinent labs & imaging results that were available during my care of the patient were reviewed by me and considered in my medical decision making (see chart for details).  Clinical Course as of Apr 08 2023  Thu Apr 07, 2018  2021 Spoke with Dr. Blaine Hamper, hospitalist. Agrees to admit the patient.    [SJ]    Clinical Course User Index [SJ] Leeanne Butters C, PA-C    Patient presents with complaint of lower abdominal pain and rectal bleeding.  Initial BP noted to be 86/62.  Responded well to IV fluids.  Hemoccult positive.  Evidence of colitis on CT.  Admission due to initial blood pressure and known GI bleeding for antibiotics, monitoring, and serial hemoglobins.    Vitals:   04/07/18 1435 04/07/18 1602 04/07/18 1754 04/07/18 2010  BP: 101/68  101/66 106/73  Pulse: 80  78 76  Resp: 16  18 18   Temp: (!) 97.5 F (36.4 C)   97.9 F (36.6 C)  TempSrc: Oral   Oral  SpO2: 100%  100% 100%  Weight:  84.8 kg (187 lb)    Height:  5\' 6"  (1.676 m)      Orthostatic VS for the past 24 hrs:  BP- Lying Pulse- Lying BP- Sitting Pulse- Sitting BP- Standing at 0 minutes Pulse- Standing at 0 minutes  04/07/18 1613 98/71 77 96/69 88 96/68 89      Final Clinical Impressions(s) / ED Diagnoses   Final diagnoses:  Colitis    ED Discharge Orders    None       Layla Maw 04/07/18 2024    Davonna Belling, MD 04/07/18 2357

## 2018-04-07 NOTE — Progress Notes (Signed)
Pt gave permission for her husband to remain in the room while questions were asked on the nursing admission hx. Lucius Conn BSN, RN-BC Admissions RN 04/07/2018 9:10 PM

## 2018-04-07 NOTE — Progress Notes (Signed)
Pharmacy Antibiotic Note  Mary Stark is a 62 y.o. female presented to the ED on 04/07/2018 with abd pain and blood in stool.  Abd CT showed acute colitis.  To start cipro for intra-abdominal infection.  - scr 1.65 (crcl~40)  Plan: - cipro 400 mg IV q12h - flagyl 500 mg IV q8h per MD - f/u renal function _____________________________________  Height: 5\' 6"  (167.6 cm) Weight: 187 lb (84.8 kg) IBW/kg (Calculated) : 59.3  Temp (24hrs), Avg:97.6 F (36.4 C), Min:97.4 F (36.3 C), Max:97.9 F (36.6 C)  Recent Labs  Lab 04/07/18 1608 04/07/18 1908  WBC 12.5*  --   CREATININE 1.65*  --   LATICACIDVEN  --  1.59    Estimated Creatinine Clearance: 39.8 mL/min (A) (by C-G formula based on SCr of 1.65 mg/dL (H)).    Allergies  Allergen Reactions  . Penicillins Hives and Rash    Has patient had a PCN reaction causing immediate rash, facial/tongue/throat swelling, SOB or lightheadedness with hypotension: Yes Has patient had a PCN reaction causing severe rash involving mucus membranes or skin necrosis:No Has patient had a PCN reaction that required hospitalization: Yes Has patient had a PCN reaction occurring within the last 10 years: No If all of the above answers are "NO", then may proceed with Cephalosporin use.       Thank you for allowing pharmacy to be a part of this patient's care.  Lynelle Doctor 04/07/2018 9:29 PM

## 2018-04-07 NOTE — ED Notes (Signed)
Hospitalist at bedside 

## 2018-04-07 NOTE — ED Triage Notes (Signed)
Pt BIB [facility] from [facility]. She has been experiencing rectal bleeding for the last 13 hours. Also reports LLQ pain and nausea. 400 mL of fluid and 4mg  of Zofran on board. Pt recently started on Glucophage for weight loss and she feels that that may have something to do with her complaints. A&Ox4. Ambulatory.

## 2018-04-07 NOTE — ED Triage Notes (Signed)
Pt was flush and felt hot last night.  Then was clammy, and had massive stomach cramps.  Had a BM last night that had bright red blood.  She stated that she was weak, and could not stand, and felt like she was going to pass out.  She has had constant pain, nausea, but denies vomiting.

## 2018-04-07 NOTE — H&P (Signed)
History and Physical    Mary Stark YIR:[id number] DOB: October 06, 1956 DOA: 04/07/2018  Referring MD/NP/PA:   PCP: Trixie Dredge, PA-C   Patient coming from:  The patient is coming from home.  At baseline, pt is independent for most of ADL.  HPI: Mary Stark is a 62 y.o. female with medical history significant of hypertension, hyperlipidemia, depression, ankle and tobacco abuse, HCV, obesity, who presents with nausea, diarrhea and abdominal pain.  Patient is is a Marine scientist working in [employer]  She states that she started having nausea, abdominal pain diarrhea since last night.  She did not have vomiting.  She has diarrhea almost every 3 hours, with watery stools 3.  She has abdominal pain, which is located in the right lower quadrant, constantly, 8 out of 10,  crampy pain, nonradiating.  She also reports of bloody stool.  No fever or chills.  She feels lightheaded and the dizziness.  No chest pain, shortness of breath, cough, denies symptoms of a UTI or unilateral weakness.  ED Course: pt was found to have WBC 12.5, lactic acid of 1.59, INR 1.01, PTT 30, positive FOBT, acute renal injury with creatinine 1.65, BUN 34, temperature normal, hypotension with blood pressure 80/62, no tachycardia, no tachypnea, oxygen saturation 100% on room air.  CT abdomen/pelvis showed acute colitis.  Patient is placed on Med[facility] [place] for observation.   Review of Systems:   General: no fevers, chills, no body weight gain, has poor appetite, has fatigue HEENT: no blurry vision, hearing changes or sore throat Respiratory: no dyspnea, coughing, wheezing CV: no chest pain, no palpitations GI: has nausea, abdominal pain, bloody diarrhea, no vomiting or constipation GU: no dysuria, burning on urination, increased urinary frequency, hematuria  Ext: no leg edema Neuro: no unilateral weakness, numbness, or tingling, no vision change or hearing loss Skin: no rash, no skin tear. MSK: No muscle  spasm, no deformity, no limitation of range of movement in spin Heme: No easy bruising.  Travel history: No recent long distant travel.  Allergy:  Allergies  Allergen Reactions  . Penicillins Hives and Rash    Has patient had a PCN reaction causing immediate rash, facial/tongue/throat swelling, SOB or lightheadedness with hypotension: Yes Has patient had a PCN reaction causing severe rash involving mucus membranes or skin necrosis:No Has patient had a PCN reaction that required hospitalization: Yes Has patient had a PCN reaction occurring within the last 10 years: No If all of the above answers are "NO", then may proceed with Cephalosporin use.     Past Medical History:  Diagnosis Date  . Alcohol dependence ([place])   . Depression 07/17/2013  . History of hepatitis C 07/17/2013  . History of rheumatic fever   . Hyperlipidemia 07/17/2013  . Hypertension   . Tobacco use   . Toe fracture, right 10/07/2016  . Vitamin D deficiency 07/17/2013    Past Surgical History:  Procedure Laterality Date  . CESAREAN SECTION  1984  . FINGER SURGERY Right    5th  . GYNECOLOGIC CRYOSURGERY    . TONSILLECTOMY      Social History:  reports that she has been smoking cigarettes.  She has a 25.00 pack-year smoking history. She has never used smokeless tobacco. She reports that she drinks about 14.4 - 21.6 oz of alcohol per week. She reports that she does not use drugs.  Family History:  Family History  Problem Relation Age of Onset  . Ovarian cancer Mother   . Cardiomyopathy Father   .  Valvular heart disease Father   . Liver cancer Brother   . Protein C deficiency Brother   . Protein C deficiency Brother   . Stroke Brother      Prior to Admission medications   Medication Sig Start Date End Date Taking? Authorizing Provider  ARIPiprazole (ABILIFY) 10 MG tablet Take 1 tablet (10 mg total) by mouth at bedtime. 03/30/18  Yes Trixie Dredge, PA-C  Ascorbic Acid (VITAMIN C PO) Take 1 tablet  by mouth daily.   Yes [provider]  aspirin EC 81 MG tablet Take 1 tablet (81 mg total) by mouth daily. 01/06/18  Yes Trixie Dredge, PA-C  buPROPion (WELLBUTRIN XL) 300 MG 24 hr tablet Take 1 tablet (300 mg total) by mouth daily. 03/02/18  Yes Trixie Dredge, PA-C  CALCIUM PO Take 1 tablet by mouth daily.   Yes [provider]  Cyanocobalamin (VITAMIN B 12 PO) Take 1 tablet by mouth daily.   Yes [provider]  cyclobenzaprine (FLEXERIL) 5 MG tablet Take 1-2 tablets (5-10 mg total) by mouth at bedtime. 03/30/18  Yes Trixie Dredge, PA-C  Estradiol 10 MCG TABS vaginal tablet Place vaginally.   Yes [provider]  GLUCOSAMINE-CHONDROITIN PO Take 1 tablet by mouth daily.   Yes [provider]  lisinopril (PRINIVIL,ZESTRIL) 20 MG tablet Take 1 tablet (20 mg total) by mouth daily. 02/02/18  Yes Trixie Dredge, PA-C  meloxicam (MOBIC) 15 MG tablet Take 1 tablet (15 mg total) by mouth daily as needed for pain. 03/30/18  Yes Trixie Dredge, PA-C  metFORMIN (GLUCOPHAGE) 500 MG tablet 500 mg PO nightly with supper for 1 week, then 500 mg PO bid with meals 03/30/18  Yes Trixie Dredge, PA-C  metoprolol succinate (TOPROL-XL) 50 MG 24 hr tablet Take 1 tablet (50 mg total) by mouth daily. 03/30/18  Yes Trixie Dredge, PA-C  Omega-3 Fatty Acids (FISH OIL PO) Take 1 tablet by mouth daily.   Yes [provider]  omeprazole (PRILOSEC) 20 MG capsule Take 1 capsule (20 mg total) by mouth daily. 03/30/18  Yes Trixie Dredge, PA-C  simvastatin (ZOCOR) 20 MG tablet Take 1 tablet (20 mg total) by mouth at bedtime. TAKE 1 TABLET BY MOUTH DAILY 01/11/18  Yes Trixie Dredge, [place]  VITAMIN E PO Take 1 tablet by mouth daily.   Yes [provider]  albuterol (PROVENTIL HFA;VENTOLIN HFA) 108 (90 Base) MCG/ACT inhaler Inhale 1-2 puffs into the lungs every 4  (four) hours as needed for wheezing or shortness of breath. Patient not taking: Reported on 04/07/2018 02/02/18   Trixie Dredge, [place]    Physical Exam: Vitals:   04/07/18 2227 04/07/18 2228 04/07/18 2229 04/07/18 2252  BP: 128/80 114/76 123/72   Pulse: 84 88 77   Resp: 18     Temp: 98.3 F (36.8 C)     TempSrc: Oral     SpO2: 100% 100% 99%   Weight:    85 kg (187 lb 6.4 oz)  Height:    5\' 6"  (1.676 m)   General: Not in acute distress.  Dry mucosal membrane HEENT:       Eyes: PERRL, EOMI, no scleral icterus.       ENT: No discharge from the ears and nose, no pharynx injection, no tonsillar enlargement.        Neck: No JVD, no bruit, no mass felt. Heme: No neck lymph node enlargement. Cardiac: S1/S2, RRR, No murmurs, No  gallops or rubs. Respiratory:  No rales, wheezing, rhonchi or rubs. GI: Soft, nondistended, has tenderness in RLQ, no rebound pain, no organomegaly, BS present. GU: No hematuria Ext: No pitting leg edema bilaterally. 2+DP/PT pulse bilaterally. Musculoskeletal: No joint deformities, No joint redness or warmth, no limitation of ROM in spin. Skin: No rashes.  Neuro: Alert, oriented X3, cranial nerves II-XII grossly intact, moves all extremities normally. Psych: Patient is not psychotic, no suicidal or hemocidal ideation.  Labs on Admission: I have personally reviewed following labs and imaging studies  CBC: Recent Labs  Lab 04/07/18 1608 04/07/18 2124  WBC 12.5* 9.8  HGB 12.9 11.9*  HCT 39.0 36.6  MCV 95.6 96.6  PLT 247 841   Basic Metabolic Panel: Recent Labs  Lab 04/07/18 1608  NA 131*  K 4.2  CL 99*  CO2 22  GLUCOSE 132*  BUN 34*  CREATININE 1.65*  CALCIUM 8.7*   GFR: Estimated Creatinine Clearance: 39.8 mL/min (A) (by C-G formula based on SCr of 1.65 mg/dL (H)). Liver Function Tests: Recent Labs  Lab 04/07/18 1608  AST 18  ALT 17  ALKPHOS 47  BILITOT 0.8  PROT 6.8  ALBUMIN 3.8   No results for input(s): LIPASE, AMYLASE  in the last 168 hours. No results for input(s): AMMONIA in the last 168 hours. Coagulation Profile: Recent Labs  Lab 04/07/18 1608  INR 1.01   Cardiac Enzymes: No results for input(s): CKTOTAL, CKMB, CKMBINDEX, TROPONINI in the last 168 hours. BNP (last 3 results) No results for input(s): PROBNP in the last 8760 hours. HbA1C: No results for input(s): HGBA1C in the last 72 hours. CBG: No results for input(s): GLUCAP in the last 168 hours. Lipid Profile: No results for input(s): CHOL, HDL, LDLCALC, TRIG, CHOLHDL, LDLDIRECT in the last 72 hours. Thyroid Function Tests: No results for input(s): TSH, T4TOTAL, FREET4, T3FREE, THYROIDAB in the last 72 hours. Anemia Panel: No results for input(s): VITAMINB12, FOLATE, FERRITIN, TIBC, IRON, RETICCTPCT in the last 72 hours. Urine analysis: No results found for: COLORURINE, APPEARANCEUR, LABSPEC, PHURINE, GLUCOSEU, HGBUR, BILIRUBINUR, KETONESUR, PROTEINUR, UROBILINOGEN, NITRITE, LEUKOCYTESUR Sepsis Labs: @LABRCNTIP (procalcitonin:4,lacticidven:4) )No results found for this or any previous visit (from the past 240 hour(s)).   Radiological Exams on Admission: Ct Abdomen Pelvis W Contrast  Result Date: 04/07/2018 CLINICAL DATA:  Pt was flush and felt hot last night. Then was clammy, and had massive stomach cramps. Diffuse severe lower abdominal pain especially left lower quadrant, 8 out of 10 intensity. Bowel movement last night had bright red blood. Week, BB near syncopal. Pain and nausea. EXAM: CT ABDOMEN AND PELVIS WITH CONTRAST TECHNIQUE: Multidetector CT imaging of the abdomen and pelvis was performed using the standard protocol following bolus administration of intravenous contrast. CONTRAST:  45mL ISOVUE-300 IOPAMIDOL (ISOVUE-300) INJECTION 61% COMPARISON:  None. FINDINGS: Lower chest: No acute abnormality. Hepatobiliary: Small liver cyst is identified in the LEFT hepatic lobe. No suspicious liver lesion. Gallbladder is present. Pancreas:  Unremarkable. No pancreatic ductal dilatation or surrounding inflammatory changes. Spleen: Normal in size without focal abnormality. Adrenals/Urinary Tract: Adrenal glands are unremarkable. Kidneys are normal, without renal calculi, focal lesion, or hydronephrosis. Bladder is unremarkable. Stomach/Bowel: The stomach and small bowel loops are normal in appearance. The appendix is well seen and has a normal appearance. There is diffusely thickened wall involving the distal transverse, descending, and sigmoid colon. Pericolonic stranding is consistent with inflammatory changes and/or edema. No discrete mass. No evidence for perforation or abscess. Minimal diverticular disease. Vascular/Lymphatic: There is mild atherosclerotic  calcification of the abdominal aorta not associated with aneurysm. No retroperitoneal or mesenteric adenopathy. Reproductive: The uterus is present.  No adnexal mass. Other: There is a small amount of free pelvic fluid. Anterior abdominal wall is unremarkable. Musculoskeletal: Degenerative changes are seen in the lumbar spine, with disc height loss and vacuum disc noted at L4-5 and L5-S1. No suspicious lytic or blastic lesions are identified. IMPRESSION: 1. Colonic wall thickening consistent with acute colitis. Considerations include infectious or inflammatory causes, significantly more likely than ischemic causes given the distribution. Findings are atypical for acute diverticulitis. There is minimal diverticulosis. 2.  Aortic atherosclerosis.  (ICD10-I70.0) 3. Normal appendix. 4. Small amount of free pelvic fluid. 5. Benign LEFT liver cyst. Electronically Signed   By: Nolon Nations M.D.   On: 04/07/2018 18:50     EKG: Independently reviewed.  Sinus rhythm, QTC 414, no ischemic change.  Assessment/Plan Principal Problem:   Acute colitis Active Problems:   Dyslipidemia, goal LDL below 100   History of alcohol dependence (H[facility])   Essential hypertension with goal blood pressure less  than 140/90   Class 1 obesity due to excess calories with serious comorbidity in adult   Depression   AKI (acute kidney injury) ([place])   Sepsis ([place])   Tobacco abuse   Acute colitis and sepsis: Patient meet criteria for sepsis with hypotension and leukocytosis.  Blood pressure responded to IV fluid, improved still while 106/73 currently.  Hemodynamically stable now. - will place on med-surg bed - npo  - prn morphine for pain   - hydroxyzine prn for nausea - Will check C diff pcr, GI pathogen panel - Blood culture x 2 - IV antibiotics: Flagyl plus Cipro - will get Procalcitonin and trend lactic acid level per sepsis protocol - IVF: 2.5 L of NS bolus in ED, followed by 100 cc/h  Essential hypertension with goal blood pressure less than 140/90: now has hypotension -Hold Bp med -IV hydralazine  Dyslipidemia, goal LDL below 100: -zocor  Tobacco abuse and Alcohol abuse: -Nicotine patch -CIWA protocol  Class 1 obesity due to excess calories with serious comorbidity in adult: pt is taking metformin at home -hold metformin while in hospital  Depression: no SI or HI. Not taking med at home. -observe closely  AKI: Likely due to prerenal secondary to dehydration and continuation of ACEI, NSAIDs - IVF as above - Follow up renal function by BMP - Check FeNa  - Hold lisinopril  DVT ppx: SCD Code Status: Full code Family Communication: Yes, patient's  husband  at bed side Disposition Plan:  Anticipate discharge back to previous home environment Consults called:  none Admission status:  medical floor/obs    Date of Service 04/08/2018    Ivor Costa [facility] Pager [phone]  If 7PM-7AM, please contact night-coverage [email or web address] Password [facility] 04/08/2018, 12:03 AM

## 2018-04-07 NOTE — ED Provider Notes (Signed)
Mary Stark CARE    CSN: [id number] Arrival date & time: 04/07/18  1230     History   Chief Complaint Chief Complaint  Patient presents with  . Abdominal Pain  . Nausea    HPI Mary Stark is a 62 y.o. female.   HPI Pt was flush and felt hot last night.  Then was clammy, and had massive stomach cramps.  Diffuse severe lower abdominal pain especially left lower quadrant, 8 out of 10 intensity.  Had a BM last night that had bright red blood.  She stated that she was weak, and could not stand, and felt like she was going to pass out.  She has had constant pain, nausea, but denies vomiting.  Feels feverish and sweaty.  No chest pain or shortness of breath. She lives in [place].  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   Husband took [facility] urgent care for evaluation and treatment Last food intake 11:30 PM last night  Past Medical History:  Diagnosis Date  . Alcohol dependence ([place])   . Depression 07/17/2013  . History of hepatitis C 07/17/2013  . History of rheumatic fever   . Hyperlipidemia 07/17/2013  . Hypertension   . Tobacco use   . Toe fracture, right 10/07/2016  . Vitamin D deficiency 07/17/2013  Reviewed above past medical history, including history of hepatitis C, hyperlipidemia and hypertension  Patient Active Problem List   Diagnosis Date Noted  . Avascular necrosis of humeral head, right ([place]) 03/30/2018  . NSAID long-term use 03/30/2018  . Encounter for weight loss counseling 03/30/2018  . Seborrheic keratoses 03/15/2018  . History of nonmelanoma skin cancer 03/02/2018  . Skin lesion of chest wall 03/02/2018  . Alcohol dependence with unspecified alcohol-induced disorder ([place]) 02/11/2018  . Severe episode of recurrent major depressive disorder, without psychotic features ([place]) 02/11/2018  . Transaminitis 01/06/2018  . Nicotine dependence 01/06/2018  . Heavy alcohol consumption 01/06/2018  . Encounter for monitoring  statin therapy 01/06/2018  . Class 1 obesity due to excess calories with serious comorbidity in adult 01/06/2018  . Chronic pain syndrome 04/21/2017  . Chronic right shoulder pain 07/03/2015  . Vaginismus 04/12/2014  . Essential hypertension with goal blood pressure less than 140/90 01/17/2014  . Menopausal state 11/08/2013  . Family history of ovarian cancer 09/27/2013  . 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 Personal history of colonic polyps 07/18/2013  . Dyslipidemia, goal LDL below 100 07/17/2013  . Depression with anxiety 07/17/2013  . Hx of hepatitis C 07/17/2013  . Vitamin D deficiency 07/17/2013  . Right lumbar radiculopathy 07/17/2013  . History of alcohol dependence ([place]) 07/17/2013  . H/O: rheumatic fever 06/30/2012  . Insomnia 06/30/2012    Past Surgical History:  Procedure Laterality Date  . CESAREAN SECTION  1984  . FINGER SURGERY Right    5th  . GYNECOLOGIC CRYOSURGERY    . TONSILLECTOMY    Pertinent past surgeries C-section 1984 and gynecologic cryosurgery  OB History   None      Home Medications    Prior to Admission medications   Medication Sig Start Date End Date Taking? Authorizing Provider  albuterol (PROVENTIL HFA;VENTOLIN HFA) 108 (90 Base) MCG/ACT inhaler Inhale 1-2 puffs into the lungs every 4 (four) hours as needed for wheezing or shortness of breath. 02/02/18   Trixie Dredge, PA-C  ARIPiprazole (ABILIFY) 10 MG tablet Take 1 tablet (10 mg total) by mouth at bedtime. 03/30/18   Maisie Fus,  [place], PA-C  aspirin EC 81 MG tablet Take 1 tablet (81 mg total) by mouth daily. 01/06/18   Trixie Dredge, PA-C  buPROPion (WELLBUTRIN XL) 300 MG 24 hr tablet Take 1 tablet (300 mg total) by mouth daily. 03/02/18   Trixie Dredge, PA-C  cyclobenzaprine (FLEXERIL) 5 MG tablet Take 1-2 tablets (5-10 mg total) by mouth at bedtime. 03/30/18   Trixie Dredge, PA-C  Estradiol 10 MCG TABS vaginal  tablet Place vaginally.    [provider]  lisinopril (PRINIVIL,ZESTRIL) 20 MG tablet Take 1 tablet (20 mg total) by mouth daily. 02/02/18   Trixie Dredge, PA-C  meloxicam (MOBIC) 15 MG tablet Take 1 tablet (15 mg total) by mouth daily as needed for pain. 03/30/18   Trixie Dredge, PA-C  metFORMIN (GLUCOPHAGE) 500 MG tablet 500 mg PO nightly with supper for 1 week, then 500 mg PO bid with meals 03/30/18   Trixie Dredge, PA-C  metoprolol succinate (TOPROL-XL) 50 MG 24 hr tablet Take 1 tablet (50 mg total) by mouth daily. 03/30/18   Trixie Dredge, PA-C  omeprazole (PRILOSEC) 20 MG capsule Take 1 capsule (20 mg total) by mouth daily. 03/30/18   Trixie Dredge, PA-C  simvastatin (ZOCOR) 20 MG tablet Take 1 tablet (20 mg total) by mouth at bedtime. TAKE 1 TABLET BY MOUTH DAILY 01/11/18   Trixie Dredge, PA-C    Family History Family History  Problem Relation Age of Onset  . Ovarian cancer Mother   . Cardiomyopathy Father   . Valvular heart disease Father   . Liver cancer Brother   . Protein C deficiency Brother   . Protein C deficiency Brother   . Stroke Brother     Social History Social History   Tobacco Use  . Smoking status: Current Some Day Smoker    Packs/day: 1.00    Years: 25.00    Pack years: 25.00    Types: Cigarettes    Last attempt to quit: 12/17/2012    Years since quitting: 5.3  . Smokeless tobacco: Never Used  . Tobacco comment: <1 cig per month  Substance Use Topics  . Alcohol use: Yes    Alcohol/week: 14.4 - 21.6 oz    Types: 24 - 36 Cans of beer per week  . Drug use: No     Allergies   Penicillins   Review of Systems Review of Systems See HPI. Positive for lower abdominal pain.  Nausea without vomiting.  Has bright red blood per rectum.  Physical Exam Triage Vital Signs ED Triage Vitals  Enc Vitals Group     BP 04/07/18 1325 (!) 86/62     Pulse Rate 04/07/18 1325 90       Resp --      Temp 04/07/18 1325 (!) 97.4 F (36.3 C)     Temp Source 04/07/18 1325 Oral     SpO2 04/07/18 1325 99 %     Weight 04/07/18 1327 187 lb (84.8 kg)     Height 04/07/18 1327 5\' 6"  (1.676 m)     Head Circumference --      Peak Flow --      Pain Score 04/07/18 1326 4     Pain Loc --      Pain Edu? --      Excl. in [place]? --    No data found.  Updated Vital Signs BP (!) 86/62 (BP Location: Right Arm)   Pulse 90   Temp Marland Kitchen)  97.4 F (36.3 C) (Oral)   Ht 5\' 6"  (1.676 m)   Wt 187 lb (84.8 kg)   SpO2 99%   BMI 30.18 kg/m   Visual Acuity Right Eye Distance:   Left Eye Distance:   Bilateral Distance:    Right Eye Near:   Left Eye Near:    Bilateral Near:     Physical Exam  Nursing note and vitals reviewed. Appears acutely ill.  Pale. she is alert, lying on exam table.  She states she feels lightheaded.  While lying, BP 86/62.  We did not check BP standing. Oropharynx clear Neck supple no adenopathy Lungs clear Heart regular rate rhythm without murmur Abdomen: Soft.  Bowel sounds normoactive x4.  Abdomen with guarding diffusely and tenderness diffusely but especially periumbilical and left lower quadrant tenderness.  Mild right lower quadrant tenderness.  No rebound.  No masses.   UC Treatments / Results  Labs (all labs ordered are listed, but only abnormal results are displayed) Labs Reviewed  POCT CBC W AUTO DIFF ([facility])   Hemoglobin 14.4, WBC 14.9 with left shift EKG None Radiology No results found.  Procedures Procedures (including critical care time)  Medications Ordered in UC Medications - No data to display   Initial Impression / Assessment and Plan / UC Course  I have reviewed the triage vital signs and the nursing notes.  Pertinent labs & imaging results that were available during my care of the patient were reviewed by me and considered in my medical decision making (see chart for details).      Final Clinical  Impressions(s) / UC Diagnoses   Final diagnoses:  Acute bilateral lower abdominal pain  Acute lower GI bleeding  Hypotension determined by examination  In my opinion, patient needs a stat transport to hospital ED for further evaluation and treatment.  She has acute lower GI bleed and severe lower abdominal pain.  She might have a surgical abdomen.  In the differential is diverticulitis, acute appendicitis, and other possible causes.  We called 911 stat and EMS arrived.-Patient and husband request [facility]. EMS now transporting patient now to [facility] in [place] for further evaluation and treatment. EMS started IV on route.     Jacqulyn Cane, MD 04/07/18 1400

## 2018-04-07 NOTE — ED Notes (Signed)
ED TO INPATIENT HANDOFF REPORT  Name/Age/Gender Mary Stark 62 y.o. female  Code Status    Code Status Orders  (From admission, onward)        Start     Ordered   04/07/18 2130  Full code  Continuous     04/07/18 2130    Code Status History    This patient has a current code status but no historical code status.      Home/SNF/Other Home  Chief Complaint Hypotensive; Rectal Bleeding  Level of Care/Admitting Diagnosis ED Disposition    ED Disposition Condition Comment   Admit  Hospital Area: [facility] [[id number]]  Level of Care: Med-Surg [16]  Diagnosis: Colitis [502774]  Admitting Physician: Ivor Costa [[id number]]  Attending Physician: Ivor Costa [[id number]]  PT Class (Do Not Modify): Observation [104]  PT Acc Code (Do Not Modify): Observation [10022]       Medical History Past Medical History:  Diagnosis Date  . Alcohol dependence ([place])   . Depression 07/17/2013  . History of hepatitis C 07/17/2013  . History of rheumatic fever   . Hyperlipidemia 07/17/2013  . Hypertension   . Tobacco use   . Toe fracture, right 10/07/2016  . Vitamin D deficiency 07/17/2013    Allergies Allergies  Allergen Reactions  . Penicillins Hives and Rash    Has patient had a PCN reaction causing immediate rash, facial/tongue/throat swelling, SOB or lightheadedness with hypotension: Yes Has patient had a PCN reaction causing severe rash involving mucus membranes or skin necrosis:No Has patient had a PCN reaction that required hospitalization: Yes Has patient had a PCN reaction occurring within the last 10 years: No If all of the above answers are "NO", then may proceed with Cephalosporin use.     IV Location/Drains/Wounds Patient Lines/Drains/Airways Status   Active Line/Drains/Airways    Name:   Placement date:   Placement time:   Site:   Days:   Peripheral IV 04/07/18 Left Antecubital   04/07/18    1617    Antecubital   less than 1           Labs/Imaging Results for orders placed or performed during the hospital encounter of 04/07/18 (from the past 48 hour(s))  Comprehensive metabolic panel     Status: Abnormal   Collection Time: 04/07/18  4:08 PM  Result Value Ref Range   Sodium 131 (L) 135 - 145 mmol/L   Potassium 4.2 3.5 - 5.1 mmol/L   Chloride 99 (L) 101 - 111 mmol/L   CO2 22 22 - 32 mmol/L   Glucose, Bld 132 (H) 65 - 99 mg/dL   BUN 34 (H) 6 - 20 mg/dL   Creatinine, Ser 1.65 (H) 0.44 - 1.00 mg/dL   Calcium 8.7 (L) 8.9 - 10.3 mg/dL   Total Protein 6.8 6.5 - 8.1 g/dL   Albumin 3.8 3.5 - 5.0 g/dL   AST 18 15 - 41 U/L   ALT 17 14 - 54 U/L   Alkaline Phosphatase 47 38 - 126 U/L   Total Bilirubin 0.8 0.3 - 1.2 mg/dL   GFR calc non Af Amer 33 (L) >60 mL/min   GFR calc Af Amer 38 (L) >60 mL/min    Comment: (NOTE) The eGFR has been calculated using the CKD EPI equation. This calculation has not been validated in all clinical situations. eGFR's persistently <60 mL/min signify possible Chronic Kidney Disease.    Anion gap 10 5 - 15    Comment: Performed at [facility]  [place] [address], [place], [place] [address]  CBC     Status: Abnormal   Collection Time: 04/07/18  4:08 PM  Result Value Ref Range   WBC 12.5 (H) 4.0 - 10.5 K/uL   RBC 4.08 3.87 - 5.11 MIL/uL   Hemoglobin 12.9 12.0 - 15.0 g/dL   HCT 39.0 36.0 - 46.0 %   MCV 95.6 78.0 - 100.0 fL   MCH 31.6 26.0 - 34.0 pg   MCHC 33.1 30.0 - 36.0 g/dL   RDW 12.6 11.5 - 15.5 %   Platelets 247 150 - 400 K/uL    Comment: Performed at [facility], [place] [address] [place], [place] [address]  Type and screen [place]     Status: None   Collection Time: 04/07/18  4:08 PM  Result Value Ref Range   ABO/RH(D) A POS    Antibody Screen NEG    Sample Expiration      04/10/2018 Performed at [facility], [place] [address], [place], [place] [address]   Protime-INR     Status: None   Collection  Time: 04/07/18  4:08 PM  Result Value Ref Range   Prothrombin Time 13.2 11.4 - 15.2 seconds   INR 1.01     Comment: Performed at [facility], [place] [address], [place], [place] [address]  APTT     Status: None   Collection Time: 04/07/18  4:08 PM  Result Value Ref Range   aPTT 30 24 - 36 seconds    Comment: Performed at [facility], [place] [address], [place], [place] [address]  POC occult blood, ED     Status: Abnormal   Collection Time: 04/07/18  6:02 PM  Result Value Ref Range   Fecal Occult Bld POSITIVE (A) NEGATIVE  I-Stat CG4 Lactic Acid, ED     Status: None   Collection Time: 04/07/18  7:08 PM  Result Value Ref Range   Lactic Acid, Venous 1.59 0.5 - 1.9 mmol/L   Ct Abdomen Pelvis W Contrast  Result Date: 04/07/2018 CLINICAL DATA:  Pt was flush and felt hot last night. Then was clammy, and had massive stomach cramps. Diffuse severe lower abdominal pain especially left lower quadrant, 8 out of 10 intensity. Bowel movement last night had bright red blood. Week, BB near syncopal. Pain and nausea. EXAM: CT ABDOMEN AND PELVIS WITH CONTRAST TECHNIQUE: Multidetector CT imaging of the abdomen and pelvis was performed using the standard protocol following bolus administration of intravenous contrast. CONTRAST:  26m ISOVUE-300 IOPAMIDOL (ISOVUE-300) INJECTION 61% COMPARISON:  None. FINDINGS: Lower chest: No acute abnormality. Hepatobiliary: Small liver cyst is identified in the LEFT hepatic lobe. No suspicious liver lesion. Gallbladder is present. Pancreas: Unremarkable. No pancreatic ductal dilatation or surrounding inflammatory changes. Spleen: Normal in size without focal abnormality. Adrenals/Urinary Tract: Adrenal glands are unremarkable. Kidneys are normal, without renal calculi, focal lesion, or hydronephrosis. Bladder is unremarkable. Stomach/Bowel: The stomach and small bowel loops are normal in appearance. The appendix is well seen and has a normal  appearance. There is diffusely thickened wall involving the distal transverse, descending, and sigmoid colon. Pericolonic stranding is consistent with inflammatory changes and/or edema. No discrete mass. No evidence for perforation or abscess. Minimal diverticular disease. Vascular/Lymphatic: There is mild atherosclerotic calcification of the abdominal aorta not associated with aneurysm. No retroperitoneal or mesenteric adenopathy. Reproductive: The uterus is present.  No adnexal mass. Other: There is a small amount of free  pelvic fluid. Anterior abdominal wall is unremarkable. Musculoskeletal: Degenerative changes are seen in the lumbar spine, with disc height loss and vacuum disc noted at L4-5 and L5-S1. No suspicious lytic or blastic lesions are identified. IMPRESSION: 1. Colonic wall thickening consistent with acute colitis. Considerations include infectious or inflammatory causes, significantly more likely than ischemic causes given the distribution. Findings are atypical for acute diverticulitis. There is minimal diverticulosis. 2.  Aortic atherosclerosis.  (ICD10-I70.0) 3. Normal appendix. 4. Small amount of free pelvic fluid. 5. Benign LEFT liver cyst. Electronically Signed   By: Nolon Nations M.D.   On: 04/07/2018 18:50    Pending Labs Unresulted Labs (From admission, onward)   Start     Ordered   04/08/18 7262  Basic metabolic panel  Tomorrow morning,   R     04/07/18 2130   04/08/18 0500  CBC  Tomorrow morning,   R     04/07/18 2130   04/07/18 2129  HIV antibody (Routine Testing)  Once,   R     04/07/18 2130   04/07/18 2128  Culture, blood (Routine X 2) w Reflex to ID Panel  BLOOD CULTURE X 2,   R    Comments:  Please obtain prior to antibiotic administration.    04/07/18 2127   04/07/18 2128  Lactic acid, plasma  Once,   STAT     04/07/18 2127   04/07/18 2128  Procalcitonin  STAT,   R     04/07/18 2127   04/07/18 2127  Creatinine, urine, random  Once,   R     04/07/18 2127    04/07/18 2127  Sodium, urine, random  Once,   R     04/07/18 2127   04/07/18 2124  CBC  Now then every 6 hours,   R     04/07/18 2123   04/07/18 1935  Gastrointestinal Panel by PCR , Stool  (Gastrointestinal Panel by PCR, Stool)  Once,   R     04/07/18 1934   04/07/18 1935  C difficile quick scan w PCR reflex  (C Difficile quick screen w PCR reflex panel)  Once, for 24 hours,   R     04/07/18 1934   04/07/18 1607  ABO/Rh  Once,   R     04/07/18 1607      Vitals/Pain Today's Vitals   04/07/18 1601 04/07/18 1602 04/07/18 1754 04/07/18 2010  BP:   101/66 106/73  Pulse:   78 76  Resp:   18 18  Temp:    97.9 F (36.6 C)  TempSrc:    Oral  SpO2:   100% 100%  Weight:  187 lb (84.8 kg)    Height:  _0  (1.676 m)    PainSc: 2        Isolation Precautions Enteric precautions (UV disinfection)  Medications Medications  ciprofloxacin (CIPRO) IVPB 400 mg (400 mg Intravenous New Bag/Given 04/07/18 2101)    And  metroNIDAZOLE (FLAGYL) IVPB 500 mg (has no administration in time range)  sodium chloride 0.9 % bolus 1,500 mL (1,500 mLs Intravenous New Bag/Given 04/07/18 2101)  0.9 %  sodium chloride infusion (has no administration in time range)  morphine 2 MG/ML injection 2 mg (has no administration in time range)  ARIPiprazole (ABILIFY) tablet 10 mg (has no administration in time range)  vitamin C (ASCORBIC ACID) tablet 500 mg (has no administration in time range)  buPROPion (WELLBUTRIN XL) 24 hr tablet 300 mg (has no administration in time  range)  Calcium 500 mg (has no administration in time range)  Vitamin B 12 LOZG 1 tablet (has no administration in time range)  cyclobenzaprine (FLEXERIL) tablet 5-10 mg (has no administration in time range)  Glucosamine-Chondroitin 250-200 MG CAPS 1 capsule (has no administration in time range)  omega-3 acid ethyl esters (LOVAZA) capsule 1 g (has no administration in time range)  pantoprazole (PROTONIX) EC tablet 40 mg (has no administration in time  range)  simvastatin (ZOCOR) tablet 20 mg (has no administration in time range)  vitamin E capsule 100 Units (has no administration in time range)  metroNIDAZOLE (FLAGYL) IVPB 500 mg (has no administration in time range)  acetaminophen (TYLENOL) tablet 650 mg (has no administration in time range)    Or  acetaminophen (TYLENOL) suppository 650 mg (has no administration in time range)  ondansetron (ZOFRAN) tablet 4 mg (has no administration in time range)    Or  ondansetron (ZOFRAN) injection 4 mg (has no administration in time range)  hydrALAZINE (APRESOLINE) injection 5 mg (has no administration in time range)  zolpidem (AMBIEN) tablet 5 mg (has no administration in time range)  ciprofloxacin (CIPRO) IVPB 400 mg (has no administration in time range)  sodium chloride 0.9 % bolus 1,000 mL (0 mLs Intravenous Stopped 04/07/18 2034)  iopamidol (ISOVUE-300) 61 % injection 80 mL (80 mLs Intravenous Contrast Given 04/07/18 1829)  morphine 2 MG/ML injection 2 mg (2 mg Intravenous Given 04/07/18 2101)    Mobility walks

## 2018-04-08 DIAGNOSIS — K529 Noninfective gastroenteritis and colitis, unspecified: Secondary | ICD-10-CM | POA: Diagnosis not present

## 2018-04-08 DIAGNOSIS — I10 Essential (primary) hypertension: Secondary | ICD-10-CM

## 2018-04-08 DIAGNOSIS — F1021 Alcohol dependence, in remission: Secondary | ICD-10-CM

## 2018-04-08 DIAGNOSIS — Z720 Tobacco use: Secondary | ICD-10-CM | POA: Diagnosis not present

## 2018-04-08 DIAGNOSIS — N179 Acute kidney failure, unspecified: Secondary | ICD-10-CM

## 2018-04-08 DIAGNOSIS — A419 Sepsis, unspecified organism: Secondary | ICD-10-CM

## 2018-04-08 LAB — SODIUM, URINE, RANDOM: Sodium, Ur: 28 mmol/L

## 2018-04-08 LAB — CREATININE, URINE, RANDOM: Creatinine, Urine: 16.6 mg/dL

## 2018-04-08 LAB — BASIC METABOLIC PANEL
ANION GAP: 8 (ref 5–15)
BUN: 17 mg/dL (ref 6–20)
CHLORIDE: 112 mmol/L — AB (ref 101–111)
CO2: 19 mmol/L — ABNORMAL LOW (ref 22–32)
Calcium: 7.9 mg/dL — ABNORMAL LOW (ref 8.9–10.3)
Creatinine, Ser: 1.01 mg/dL — ABNORMAL HIGH (ref 0.44–1.00)
GFR calc non Af Amer: 59 mL/min — ABNORMAL LOW (ref >60)
Glucose, Bld: 99 mg/dL (ref 65–99)
Potassium: 3.9 mmol/L (ref 3.5–5.1)
Sodium: 139 mmol/L (ref 135–145)

## 2018-04-08 LAB — CBC
HEMATOCRIT: 33.4 % — AB (ref 36.0–46.0)
HEMATOCRIT: 33.3 % — AB (ref 36.0–46.0)
HEMOGLOBIN: 10.8 g/dL — AB (ref 12.0–15.0)
Hemoglobin: 10.7 g/dL — ABNORMAL LOW (ref 12.0–15.0)
MCH: 31.2 pg (ref 26.0–34.0)
MCH: 31.3 pg (ref 26.0–34.0)
MCHC: 32.3 g/dL (ref 30.0–36.0)
MCHC: 32.1 g/dL (ref 30.0–36.0)
MCV: 96.5 fL (ref 78.0–100.0)
MCV: 97.4 fL (ref 78.0–100.0)
PLATELETS: 202 10*3/uL (ref 150–400)
Platelets: 206 10*3/uL (ref 150–400)
RBC: 3.46 MIL/uL — AB (ref 3.87–5.11)
RBC: 3.42 MIL/uL — ABNORMAL LOW (ref 3.87–5.11)
RDW: 13.1 % (ref 11.5–15.5)
RDW: 13.2 % (ref 11.5–15.5)
WBC: 8.8 10*3/uL (ref 4.0–10.5)
WBC: 8.2 10*3/uL (ref 4.0–10.5)

## 2018-04-08 LAB — PROCALCITONIN: Procalcitonin: 1.02 ng/mL

## 2018-04-08 LAB — GLUCOSE, CAPILLARY: Glucose-Capillary: 97 mg/dL (ref 65–99)

## 2018-04-08 LAB — HIV ANTIBODY (ROUTINE TESTING W REFLEX): HIV Screen 4th Generation wRfx: NONREACTIVE

## 2018-04-08 LAB — ABO/RH: ABO/RH(D): A POS

## 2018-04-08 LAB — LACTIC ACID, PLASMA: LACTIC ACID, VENOUS: 2.6 mmol/L — AB (ref 0.5–1.9)

## 2018-04-08 MED ORDER — OXYCODONE HCL 5 MG PO TABS
5.0000 mg | ORAL_TABLET | ORAL | Status: DC | PRN
  Administered 2018-04-08 – 2018-04-10 (×6): 5 mg via ORAL
  Filled 2018-04-08 (×6): qty 1

## 2018-04-08 MED ORDER — SODIUM CHLORIDE 0.9 % IV BOLUS
1000.0000 mL | Freq: Once | INTRAVENOUS | Status: AC
Start: 2018-04-08 — End: 2018-04-08
  Administered 2018-04-08: 1000 mL via INTRAVENOUS

## 2018-04-08 NOTE — Progress Notes (Signed)
Patient ID: Mary Stark, female   DOB: 26-Oct-1956, 62 y.o.   MRN: [id number]  PROGRESS NOTE    ANNASTYN SILVEY  DPO:[id number] DOB: [birth year] DOA: 04/07/2018 PCP: Trixie Dredge, PA-C   Brief Narrative:  62 year old female with history of hypertension, hyperlipidemia, depression, obesity, tobacco use, alcohol abuse presented on 04/07/2018 with nausea, diarrhea and abdominal pain.  CAT scan of the abdomen showed acute colitis.  She was started on intravenous antibiotics.   Assessment & Plan:   Principal Problem:   Acute colitis Active Problems:   Dyslipidemia, goal LDL below 100   History of alcohol dependence (HCC)   Essential hypertension with goal blood pressure less than 140/90   Class 1 obesity due to excess calories with serious comorbidity in adult   Depression   AKI (acute kidney injury) ([place])   Sepsis ([place])   Tobacco abuse   Sepsis secondary to acute colitis -Antibiotics plan as below -Continue intravenous fluids.  Follow cultures -Hemodynamically stable   Acute colitis -Probably bacterial in origin.  Stool for C. difficile is pending. -Continue ciprofloxacin and Flagyl intravenously for now.  Switch to oral antibiotics once tolerating diet -Advance diet to soft diet if tolerated -Decrease normal saline to 75 cc an hour  Essential hypertension -Blood pressure stable.  Antihypertensives on hold.  Monitor  Tobacco abuse and alcohol abuse -Continue CIWA protocol and nicotine patch -No signs of withdrawal  Obesity -Outpatient follow-up  Depression -Continue bupropion.  Outpatient follow-up  Acute kidney injury -Probably secondary to dehydration -Decrease normal saline to 75 cc an hour -Creatinine improving.  Repeat a.m. Labs -Lisinopril on hold   DVT prophylaxis: SCDs Code Status: Full Family Communication: Husband at bedside Disposition Plan: Home in 1 to 2 days if clinically improves  Consultants: None Procedures:  None  Antimicrobials: Ciprofloxacin and Flagyl from 04/07/2018 onwards   Subjective: Patient seen and examined at bedside.  She complains of intermittent abdominal pain.  No overnight fever or vomiting.  Objective: Vitals:   04/07/18 2228 04/07/18 2229 04/07/18 2252 04/08/18 0448  BP: 114/76 123/72  96/68  Pulse: 88 77  90  Resp:    17  Temp:    97.8 F (36.6 C)  TempSrc:    Oral  SpO2: 100% 99%  95%  Weight:   85 kg (187 lb 6.4 oz)   Height:   5\' 6"  (1.676 m)     Intake/Output Summary (Last 24 hours) at 04/08/2018 1220 Last data filed at 04/08/2018 0600 Gross per 24 hour  Intake 2880 ml  Output 750 ml  Net 2130 ml   Filed Weights   04/07/18 1602 04/07/18 2252  Weight: 84.8 kg (187 lb) 85 kg (187 lb 6.4 oz)    Examination:  General exam: Appears calm and comfortable  Respiratory system: Bilateral decreased breath sound at bases Cardiovascular system: S1 & S2 heard, rate controlled  gastrointestinal system: Abdomen is nondistended, soft and mild periumbilical and lower quadrant tenderness. Normal bowel sounds heard. Extremities: No cyanosis, clubbing, edema    Data Reviewed: I have personally reviewed following labs and imaging studies  CBC: Recent Labs  Lab 04/07/18 1608 04/07/18 2124 04/08/18 0353 04/08/18 0950  WBC 12.5* 9.8 8.8 8.2  HGB 12.9 11.9* 10.8* 10.7*  HCT 39.0 36.6 33.4* 33.3*  MCV 95.6 96.6 96.5 97.4  PLT 247 225 206 431   Basic Metabolic Panel: Recent Labs  Lab 04/07/18 1608 04/08/18 0353  NA 131* 139  K 4.2 3.9  CL 99* 112*  CO2 22 19*  GLUCOSE 132* 99  BUN 34* 17  CREATININE 1.65* 1.01*  CALCIUM 8.7* 7.9*   GFR: Estimated Creatinine Clearance: 65.1 mL/min (A) (by C-G formula based on SCr of 1.01 mg/dL (H)). Liver Function Tests: Recent Labs  Lab 04/07/18 1608  AST 18  ALT 17  ALKPHOS 47  BILITOT 0.8  PROT 6.8  ALBUMIN 3.8   No results for input(s): LIPASE, AMYLASE in the last 168 hours. No results for input(s): AMMONIA  in the last 168 hours. Coagulation Profile: Recent Labs  Lab 04/07/18 1608  INR 1.01   Cardiac Enzymes: No results for input(s): CKTOTAL, CKMB, CKMBINDEX, TROPONINI in the last 168 hours. BNP (last 3 results) No results for input(s): PROBNP in the last 8760 hours. HbA1C: No results for input(s): HGBA1C in the last 72 hours. CBG: Recent Labs  Lab 04/08/18 0733  GLUCAP 97   Lipid Profile: No results for input(s): CHOL, HDL, LDLCALC, TRIG, CHOLHDL, LDLDIRECT in the last 72 hours. Thyroid Function Tests: No results for input(s): TSH, T4TOTAL, FREET4, T3FREE, THYROIDAB in the last 72 hours. Anemia Panel: No results for input(s): VITAMINB12, FOLATE, FERRITIN, TIBC, IRON, RETICCTPCT in the last 72 hours. Sepsis Labs: Recent Labs  Lab 04/07/18 1908 04/07/18 2124 04/07/18 2128  PROCALCITON  --  1.02  --   LATICACIDVEN 1.59  --  2.6*    No results found for this or any previous visit (from the past 240 hour(s)).       Radiology Studies: Ct Abdomen Pelvis W Contrast  Result Date: 04/07/2018 CLINICAL DATA:  Pt was flush and felt hot last night. Then was clammy, and had massive stomach cramps. Diffuse severe lower abdominal pain especially left lower quadrant, 8 out of 10 intensity. Bowel movement last night had bright red blood. Week, BB near syncopal. Pain and nausea. EXAM: CT ABDOMEN AND PELVIS WITH CONTRAST TECHNIQUE: Multidetector CT imaging of the abdomen and pelvis was performed using the standard protocol following bolus administration of intravenous contrast. CONTRAST:  22mL ISOVUE-300 IOPAMIDOL (ISOVUE-300) INJECTION 61% COMPARISON:  None. FINDINGS: Lower chest: No acute abnormality. Hepatobiliary: Small liver cyst is identified in the LEFT hepatic lobe. No suspicious liver lesion. Gallbladder is present. Pancreas: Unremarkable. No pancreatic ductal dilatation or surrounding inflammatory changes. Spleen: Normal in size without focal abnormality. Adrenals/Urinary Tract:  Adrenal glands are unremarkable. Kidneys are normal, without renal calculi, focal lesion, or hydronephrosis. Bladder is unremarkable. Stomach/Bowel: The stomach and small bowel loops are normal in appearance. The appendix is well seen and has a normal appearance. There is diffusely thickened wall involving the distal transverse, descending, and sigmoid colon. Pericolonic stranding is consistent with inflammatory changes and/or edema. No discrete mass. No evidence for perforation or abscess. Minimal diverticular disease. Vascular/Lymphatic: There is mild atherosclerotic calcification of the abdominal aorta not associated with aneurysm. No retroperitoneal or mesenteric adenopathy. Reproductive: The uterus is present.  No adnexal mass. Other: There is a small amount of free pelvic fluid. Anterior abdominal wall is unremarkable. Musculoskeletal: Degenerative changes are seen in the lumbar spine, with disc height loss and vacuum disc noted at L4-5 and L5-S1. No suspicious lytic or blastic lesions are identified. IMPRESSION: 1. Colonic wall thickening consistent with acute colitis. Considerations include infectious or inflammatory causes, significantly more likely than ischemic causes given the distribution. Findings are atypical for acute diverticulitis. There is minimal diverticulosis. 2.  Aortic atherosclerosis.  (ICD10-I70.0) 3. Normal appendix. 4. Small amount of free pelvic fluid. 5. Benign LEFT liver cyst. Electronically Signed  By: Nolon Nations M.D.   On: 04/07/2018 18:50        Scheduled Meds: . ARIPiprazole  10 mg Oral QHS  . buPROPion  300 mg Oral Daily  . calcium carbonate  1 tablet Oral Q breakfast  . cyclobenzaprine  5-10 mg Oral QHS  . folic acid  1 mg Oral Daily  . LORazepam  0-4 mg Intravenous Q6H   Followed by  . [START ON 04/10/2018] LORazepam  0-4 mg Intravenous Q12H  . multivitamin with minerals  1 tablet Oral Daily  . nicotine  21 mg Transdermal Daily  . omega-3 acid ethyl  esters  1 g Oral Daily  . pantoprazole  40 mg Oral Daily  . simvastatin  20 mg Oral QHS  . thiamine  100 mg Oral Daily   Or  . thiamine  100 mg Intravenous Daily  . vitamin B-12  100 mcg Oral Daily  . vitamin C  500 mg Oral Daily  . vitamin E  100 Units Oral Daily   Continuous Infusions: . sodium chloride 100 mL/hr at 04/07/18 2312  . ciprofloxacin Stopped (04/08/18 1000)  . metronidazole    . metronidazole Stopped (04/08/18 0700)     LOS: 0 days        Aline August, MD [facility] Pager [phone]  If 7PM-7AM, please contact night-coverage [email or web address] Password [facility] 04/08/2018, 12:20 PM

## 2018-04-09 DIAGNOSIS — A419 Sepsis, unspecified organism: Secondary | ICD-10-CM | POA: Diagnosis not present

## 2018-04-09 DIAGNOSIS — I10 Essential (primary) hypertension: Secondary | ICD-10-CM | POA: Diagnosis not present

## 2018-04-09 DIAGNOSIS — N179 Acute kidney failure, unspecified: Secondary | ICD-10-CM | POA: Diagnosis not present

## 2018-04-09 DIAGNOSIS — K529 Noninfective gastroenteritis and colitis, unspecified: Secondary | ICD-10-CM | POA: Diagnosis not present

## 2018-04-09 LAB — BASIC METABOLIC PANEL
ANION GAP: 12 (ref 5–15)
BUN: 6 mg/dL (ref 6–20)
CHLORIDE: 111 mmol/L (ref 101–111)
CO2: 18 mmol/L — AB (ref 22–32)
Calcium: 8.3 mg/dL — ABNORMAL LOW (ref 8.9–10.3)
Creatinine, Ser: 0.79 mg/dL (ref 0.44–1.00)
GFR calc Af Amer: >60 mL/min (ref >60)
GFR calc non Af Amer: >60 mL/min (ref >60)
Glucose, Bld: 91 mg/dL (ref 65–99)
POTASSIUM: 4 mmol/L (ref 3.5–5.1)
Sodium: 141 mmol/L (ref 135–145)

## 2018-04-09 LAB — CBC WITH DIFFERENTIAL/PLATELET
BASOS ABS: 0 10*3/uL (ref 0.0–0.1)
Basophils Relative: 1 %
Eosinophils Absolute: 0.3 10*3/uL (ref 0.0–0.7)
Eosinophils Relative: 4 %
HEMATOCRIT: 34 % — AB (ref 36.0–46.0)
Hemoglobin: 10.7 g/dL — ABNORMAL LOW (ref 12.0–15.0)
LYMPHS ABS: 2.6 10*3/uL (ref 0.7–4.0)
LYMPHS PCT: 37 %
MCH: 31.1 pg (ref 26.0–34.0)
MCHC: 31.5 g/dL (ref 30.0–36.0)
MCV: 98.8 fL (ref 78.0–100.0)
Monocytes Absolute: 0.9 10*3/uL (ref 0.1–1.0)
Monocytes Relative: 13 %
NEUTROS ABS: 3.2 10*3/uL (ref 1.7–7.7)
Neutrophils Relative %: 45 %
Platelets: 218 10*3/uL (ref 150–400)
RBC: 3.44 MIL/uL — AB (ref 3.87–5.11)
RDW: 13.2 % (ref 11.5–15.5)
WBC: 7 10*3/uL (ref 4.0–10.5)

## 2018-04-09 LAB — GLUCOSE, CAPILLARY: Glucose-Capillary: 101 mg/dL — ABNORMAL HIGH (ref 65–99)

## 2018-04-09 LAB — MAGNESIUM: Magnesium: 1.6 mg/dL — ABNORMAL LOW (ref 1.7–2.4)

## 2018-04-09 MED ORDER — MAGNESIUM SULFATE 2 GM/50ML IV SOLN
2.0000 g | Freq: Once | INTRAVENOUS | Status: AC
  Administered 2018-04-09: 2 g via INTRAVENOUS
  Filled 2018-04-09: qty 50

## 2018-04-09 NOTE — Progress Notes (Signed)
Mary Stark, female   DOB: Sep 23, 1956, 62 y.o.   MRN: [id number]  PROGRESS NOTE    Mary Stark  WUJ:[id number] DOB: 07-25-1956 DOA: 04/07/2018 PCP: Trixie Dredge, PA-C   Brief Narrative:  62 year old female with history of hypertension, hyperlipidemia, depression, obesity, tobacco use, alcohol abuse presented on 04/07/2018 with nausea, diarrhea and abdominal pain.  CAT scan of the abdomen showed acute colitis.  She was started on intravenous antibiotics.   Assessment & Plan:   Principal Problem:   Acute colitis Active Problems:   Dyslipidemia, goal LDL below 100   History of alcohol dependence (HCC)   Essential hypertension with goal blood pressure less than 140/90   Class 1 obesity due to excess calories with serious comorbidity in adult   Depression   AKI (acute kidney injury) ([place])   Sepsis ([place])   Tobacco abuse   Sepsis secondary to acute colitis -Antibiotics plan as below -Discontinue IV fluids.  Cultures negative so far. -Hemodynamically stable   Acute colitis -Probably bacterial in origin.  No bowel movement since admission.  Unlikely to C. difficile.  Discontinue isolation -Continue ciprofloxacin and Flagyl intravenously for now.  We will switch to oral antibiotics tomorrow and plan for discharge tomorrow -Tolerating diet -Still tender on exam  Essential hypertension -Blood pressure stable.  Antihypertensives on hold.  Monitor  Tobacco abuse and alcohol abuse -Continue CIWA protocol and nicotine patch -No signs of withdrawal  Obesity -Outpatient follow-up  Depression -Continue bupropion.  Outpatient follow-up  Acute kidney injury -Probably secondary to dehydration -Resolved.  DC IV fluids -Lisinopril on hold  Hypomagnesemia -Replace.  Repeat a.m. labs   DVT prophylaxis: SCDs Code Status: Full Family Communication: Husband at bedside Disposition Plan: Home probably tomorrow if clinically improves  Consultants:  None Procedures: None  Antimicrobials: Ciprofloxacin and Flagyl from 04/07/2018 onwards   Subjective: Mary seen and examined at bedside.  She still complains of intermittent abdominal pain but has much improved.  Tolerating diet.  No overnight bowel movements. Objective: Vitals:   04/08/18 0448 04/08/18 1516 04/08/18 2014 04/09/18 0440  BP: 96/68 92/63 106/70 129/79  Pulse: 90 80 79 80  Resp: 17 18 18 19   Temp: 97.8 F (36.6 C) 98.3 F (36.8 C) 98.7 F (37.1 C) 98 F (36.7 C)  TempSrc: Oral  Oral Oral  SpO2: 95% 98% 97% 96%  Weight:      Height:        Intake/Output Summary (Last 24 hours) at 04/09/2018 1019 Last data filed at 04/09/2018 0849 Gross per 24 hour  Intake 1568.33 ml  Output -  Net 1568.33 ml   Filed Weights   04/07/18 1602 04/07/18 2252  Weight: 84.8 kg (187 lb) 85 kg (187 lb 6.4 oz)    Examination:  General exam: Appears calm and comfortable.  No distress Respiratory system: Bilateral decreased breath sounds at bases  cardiovascular system: S1 & S2 heard, rate controlled  gastrointestinal system: Abdomen is nondistended, soft and mild lower quadrant tenderness.  Normal bowel sounds heard. Extremities: No cyanosis, clubbing, edema    Data Reviewed: I have personally reviewed following labs and imaging studies  CBC: Recent Labs  Lab 04/07/18 1608 04/07/18 2124 04/08/18 0353 04/08/18 0950 04/09/18 0515  WBC 12.5* 9.8 8.8 8.2 7.0  NEUTROABS  --   --   --   --  3.2  HGB 12.9 11.9* 10.8* 10.7* 10.7*  HCT 39.0 36.6 33.4* 33.3* 34.0*  MCV 95.6 96.6 96.5 97.4 98.8  PLT  247 225 206 202 785   Basic Metabolic Panel: Recent Labs  Lab 04/07/18 1608 04/08/18 0353 04/09/18 0515  NA 131* 139 141  K 4.2 3.9 4.0  CL 99* 112* 111  CO2 22 19* 18*  GLUCOSE 132* 99 91  BUN 34* 17 6  CREATININE 1.65* 1.01* 0.79  CALCIUM 8.7* 7.9* 8.3*  MG  --   --  1.6*   GFR: Estimated Creatinine Clearance: 82.2 mL/min (by C-G formula based on SCr of 0.79  mg/dL). Liver Function Tests: Recent Labs  Lab 04/07/18 1608  AST 18  ALT 17  ALKPHOS 47  BILITOT 0.8  PROT 6.8  ALBUMIN 3.8   No results for input(s): LIPASE, AMYLASE in the last 168 hours. No results for input(s): AMMONIA in the last 168 hours. Coagulation Profile: Recent Labs  Lab 04/07/18 1608  INR 1.01   Cardiac Enzymes: No results for input(s): CKTOTAL, CKMB, CKMBINDEX, TROPONINI in the last 168 hours. BNP (last 3 results) No results for input(s): PROBNP in the last 8760 hours. HbA1C: No results for input(s): HGBA1C in the last 72 hours. CBG: Recent Labs  Lab 04/08/18 0733 04/09/18 0738  GLUCAP 97 101*   Lipid Profile: No results for input(s): CHOL, HDL, LDLCALC, TRIG, CHOLHDL, LDLDIRECT in the last 72 hours. Thyroid Function Tests: No results for input(s): TSH, T4TOTAL, FREET4, T3FREE, THYROIDAB in the last 72 hours. Anemia Panel: No results for input(s): VITAMINB12, FOLATE, FERRITIN, TIBC, IRON, RETICCTPCT in the last 72 hours. Sepsis Labs: Recent Labs  Lab 04/07/18 1908 04/07/18 2124 04/07/18 2128  PROCALCITON  --  1.02  --   LATICACIDVEN 1.59  --  2.6*    Recent Results (from the past 240 hour(s))  Culture, blood (Routine X 2) w Reflex to ID Panel     Status: None (Preliminary result)   Collection Time: 04/07/18  9:28 PM  Result Value Ref Range Status   Specimen Description   Final    BLOOD RIGHT ANTECUBITAL Performed at [facility], [place] [address] [place], [place] [address]    Special Requests   Final    BOTTLES DRAWN AEROBIC AND ANAEROBIC Blood Culture adequate volume Performed at [facility] [address], [place], [place] [address]    Culture   Final    NO GROWTH 1 DAY Performed at [facility], [place] [address], [place], [place] [address]    Report Status PENDING  Incomplete  Culture, blood (Routine X 2) w Reflex to ID Panel     Status: None (Preliminary result)   Collection Time: 04/07/18  9:33 PM   Result Value Ref Range Status   Specimen Description   Final    BLOOD LEFT HAND Performed at [facility] [address], [place], [place] [address]    Special Requests   Final    BOTTLES DRAWN AEROBIC ONLY Blood Culture adequate volume Performed at [facility] [address] [place], [place] [address]    Culture   Final    NO GROWTH 1 DAY Performed at [facility], [place] [address] [place], [place] [address]    Report Status PENDING  Incomplete         Radiology Studies: Ct Abdomen Pelvis W Contrast  Result Date: 04/07/2018 CLINICAL DATA:  Pt was flush and felt hot last night. Then was clammy, and had massive stomach cramps. Diffuse severe lower abdominal pain especially left lower quadrant, 8 out of 10 intensity.  Bowel movement last night had bright red blood. Week, BB near syncopal. Pain and nausea. EXAM: CT ABDOMEN AND PELVIS WITH CONTRAST TECHNIQUE: Multidetector CT imaging of the abdomen and pelvis was performed using the standard protocol following bolus administration of intravenous contrast. CONTRAST:  28mL ISOVUE-300 IOPAMIDOL (ISOVUE-300) INJECTION 61% COMPARISON:  None. FINDINGS: Lower chest: No acute abnormality. Hepatobiliary: Small liver cyst is identified in the LEFT hepatic lobe. No suspicious liver lesion. Gallbladder is present. Pancreas: Unremarkable. No pancreatic ductal dilatation or surrounding inflammatory changes. Spleen: Normal in size without focal abnormality. Adrenals/Urinary Tract: Adrenal glands are unremarkable. Kidneys are normal, without renal calculi, focal lesion, or hydronephrosis. Bladder is unremarkable. Stomach/Bowel: The stomach and small bowel loops are normal in appearance. The appendix is well seen and has a normal appearance. There is diffusely thickened wall involving the distal transverse, descending, and sigmoid colon. Pericolonic stranding is consistent with inflammatory changes and/or edema. No  discrete mass. No evidence for perforation or abscess. Minimal diverticular disease. Vascular/Lymphatic: There is mild atherosclerotic calcification of the abdominal aorta not associated with aneurysm. No retroperitoneal or mesenteric adenopathy. Reproductive: The uterus is present.  No adnexal mass. Other: There is a small amount of free pelvic fluid. Anterior abdominal wall is unremarkable. Musculoskeletal: Degenerative changes are seen in the lumbar spine, with disc height loss and vacuum disc noted at L4-5 and L5-S1. No suspicious lytic or blastic lesions are identified. IMPRESSION: 1. Colonic wall thickening consistent with acute colitis. Considerations include infectious or inflammatory causes, significantly more likely than ischemic causes given the distribution. Findings are atypical for acute diverticulitis. There is minimal diverticulosis. 2.  Aortic atherosclerosis.  (ICD10-I70.0) 3. Normal appendix. 4. Small amount of free pelvic fluid. 5. Benign LEFT liver cyst. Electronically Signed   By: Nolon Nations M.D.   On: 04/07/2018 18:50        Scheduled Meds: . ARIPiprazole  10 mg Oral QHS  . buPROPion  300 mg Oral Daily  . calcium carbonate  1 tablet Oral Q breakfast  . cyclobenzaprine  5-10 mg Oral QHS  . folic acid  1 mg Oral Daily  . LORazepam  0-4 mg Intravenous Q6H   Followed by  . [START ON 04/10/2018] LORazepam  0-4 mg Intravenous Q12H  . multivitamin with minerals  1 tablet Oral Daily  . nicotine  21 mg Transdermal Daily  . omega-3 acid ethyl esters  1 g Oral Daily  . pantoprazole  40 mg Oral Daily  . simvastatin  20 mg Oral QHS  . thiamine  100 mg Oral Daily   Or  . thiamine  100 mg Intravenous Daily  . vitamin B-12  100 mcg Oral Daily  . vitamin C  500 mg Oral Daily  . vitamin E  100 Units Oral Daily   Continuous Infusions: . ciprofloxacin 400 mg (04/09/18 1003)  . metronidazole Stopped (04/08/18 2202)     LOS: 0 days        Aline August, MD [facility] Pager [phone]  If 7PM-7AM, please contact night-coverage [email or web address] Password [facility] 04/09/2018, 10:19 AM

## 2018-04-10 DIAGNOSIS — A419 Sepsis, unspecified organism: Secondary | ICD-10-CM | POA: Diagnosis not present

## 2018-04-10 DIAGNOSIS — I10 Essential (primary) hypertension: Secondary | ICD-10-CM | POA: Diagnosis not present

## 2018-04-10 DIAGNOSIS — N179 Acute kidney failure, unspecified: Secondary | ICD-10-CM | POA: Diagnosis not present

## 2018-04-10 DIAGNOSIS — K529 Noninfective gastroenteritis and colitis, unspecified: Secondary | ICD-10-CM | POA: Diagnosis not present

## 2018-04-10 LAB — GLUCOSE, CAPILLARY: GLUCOSE-CAPILLARY: 134 mg/dL — AB (ref 65–99)

## 2018-04-10 LAB — CBC WITH DIFFERENTIAL/PLATELET
BASOS ABS: 0.1 10*3/uL (ref 0.0–0.1)
BASOS PCT: 1 %
EOS PCT: 5 %
Eosinophils Absolute: 0.4 10*3/uL (ref 0.0–0.7)
HCT: 36.3 % (ref 36.0–46.0)
Hemoglobin: 11.5 g/dL — ABNORMAL LOW (ref 12.0–15.0)
Lymphocytes Relative: 38 %
Lymphs Abs: 2.8 10*3/uL (ref 0.7–4.0)
MCH: 31.2 pg (ref 26.0–34.0)
MCHC: 31.7 g/dL (ref 30.0–36.0)
MCV: 98.4 fL (ref 78.0–100.0)
MONO ABS: 0.8 10*3/uL (ref 0.1–1.0)
Monocytes Relative: 10 %
Neutro Abs: 3.4 10*3/uL (ref 1.7–7.7)
Neutrophils Relative %: 46 %
PLATELETS: 263 10*3/uL (ref 150–400)
RBC: 3.69 MIL/uL — AB (ref 3.87–5.11)
RDW: 13.3 % (ref 11.5–15.5)
WBC: 7.4 10*3/uL (ref 4.0–10.5)

## 2018-04-10 LAB — BASIC METABOLIC PANEL
ANION GAP: 7 (ref 5–15)
BUN: <5 mg/dL — ABNORMAL LOW (ref 6–20)
CALCIUM: 8.8 mg/dL — AB (ref 8.9–10.3)
CO2: 23 mmol/L (ref 22–32)
Chloride: 111 mmol/L (ref 101–111)
Creatinine, Ser: 0.9 mg/dL (ref 0.44–1.00)
GFR calc non Af Amer: >60 mL/min (ref >60)
GLUCOSE: 98 mg/dL (ref 65–99)
POTASSIUM: 4 mmol/L (ref 3.5–5.1)
SODIUM: 141 mmol/L (ref 135–145)

## 2018-04-10 LAB — MAGNESIUM: Magnesium: 1.6 mg/dL — ABNORMAL LOW (ref 1.7–2.4)

## 2018-04-10 MED ORDER — CIPROFLOXACIN HCL 500 MG PO TABS: 500.0000 mg | ORAL_TABLET | Freq: Two times a day (BID) | ORAL | 0 refills | Status: DC

## 2018-04-10 MED ORDER — METRONIDAZOLE 500 MG PO TABS: 500.0000 mg | ORAL_TABLET | Freq: Three times a day (TID) | ORAL | 0 refills | Status: AC

## 2018-04-10 MED ORDER — OXYCODONE HCL 5 MG PO TABS: 5.0000 mg | ORAL_TABLET | ORAL | 0 refills | Status: DC | PRN

## 2018-04-10 MED ORDER — MAGNESIUM SULFATE 2 GM/50ML IV SOLN
2.0000 g | Freq: Once | INTRAVENOUS | Status: AC
  Administered 2018-04-10: 2 g via INTRAVENOUS
  Filled 2018-04-10: qty 50

## 2018-04-10 MED ORDER — METFORMIN HCL 500 MG PO TABS: 500.0000 mg | ORAL_TABLET | Freq: Two times a day (BID) | ORAL | Status: DC

## 2018-04-10 MED ORDER — ONDANSETRON HCL 4 MG PO TABS: 4.0000 mg | ORAL_TABLET | Freq: Four times a day (QID) | ORAL | 0 refills | Status: DC | PRN

## 2018-04-10 NOTE — Discharge Summary (Signed)
Physician Discharge Summary  Mary Stark GMW:[id number] DOB: 02/08/1956 DOA: 04/07/2018  PCP: Trixie Dredge, PA-C  Admit date: 04/07/2018 Discharge date: 04/10/2018  Admitted From: Home Disposition:  Home  Recommendations for Outpatient Follow-up:  1. Follow up with PCP in 1 week 2. Outpatient follow-up with GI/Dr. Paulita Fujita if needed   Home Health: No Equipment/Devices: None  Discharge Condition: Stable CODE STATUS: Full  diet recommendation: Heart Healthy   Brief/Interim Summary: 62 year old female with history of hypertension, hyperlipidemia, depression, obesity, tobacco use, alcohol abuse presented on 04/07/2018 with nausea, diarrhea and abdominal pain.  CAT scan of the abdomen showed acute colitis.  She was started on intravenous antibiotics.  Her condition improved.  She will be discharged on oral antibiotics.    Discharge Diagnoses:  Principal Problem:   Acute colitis Active Problems:   Dyslipidemia, goal LDL below 100   History of alcohol dependence (HCC)   Essential hypertension with goal blood pressure less than 140/90   Class 1 obesity due to excess calories with serious comorbidity in adult   Depression   AKI (acute kidney injury) ([place])   Sepsis ([place])   Tobacco abuse   Sepsis secondary to acute colitis -Resolved.   -Antibiotics plan as below -Cultures negative so far. -Hemodynamically stable   Acute colitis -Probably bacterial in origin.  No bowel movement since admission.  Unlikely to  be C. difficile.   -Currently on IV ciprofloxacin and Flagyl.  Tolerating diet.  Feels better.  Will switch to oral ciprofloxacin and Flagyl for 1 more week and discharge patient home. -Patient might benefit from outpatient GI evaluation and primary care provider again send in the referral. -Abdominal pain has much improved.   Essential hypertension -Blood pressure stable.    Resume home antihypertensives.  Tobacco abuse and alcohol abuse -Currently on  CIWA protocol and nicotine patch -No signs of withdrawal -Outpatient follow-up  Obesity -Outpatient follow-up  Depression -Continue bupropion.  Outpatient follow-up  Acute kidney injury -Probably secondary to dehydration -Resolved.    Of IV fluids -Resume lisinopril on discharge  Hypomagnesemia -Replace prior to discharge.    Discharge Instructions  Discharge Instructions    Call MD for:  difficulty breathing, headache or visual disturbances   Complete by:  As directed    Call MD for:  extreme fatigue   Complete by:  As directed    Call MD for:  hives   Complete by:  As directed    Call MD for:  persistant dizziness or light-headedness   Complete by:  As directed    Call MD for:  persistant nausea and vomiting   Complete by:  As directed    Call MD for:  severe uncontrolled pain   Complete by:  As directed    Call MD for:  temperature >100.4   Complete by:  As directed    Diet - low sodium heart healthy   Complete by:  As directed    Increase activity slowly   Complete by:  As directed      Allergies as of 04/10/2018      Reactions   Penicillins Hives, Rash   Has patient had a PCN reaction causing immediate rash, facial/tongue/throat swelling, SOB or lightheadedness with hypotension: Yes Has patient had a PCN reaction causing severe rash involving mucus membranes or skin necrosis:No Has patient had a PCN reaction that required hospitalization: Yes Has patient had a PCN reaction occurring within the last 10 years: No If all of the above answers are "  NO", then may proceed with Cephalosporin use.      Medication List    TAKE these medications   albuterol 108 (90 Base) MCG/ACT inhaler Commonly known as:  PROVENTIL HFA;VENTOLIN HFA Inhale 1-2 puffs into the lungs every 4 (four) hours as needed for wheezing or shortness of breath.   ARIPiprazole 10 MG tablet Commonly known as:  ABILIFY Take 1 tablet (10 mg total) by mouth at bedtime.   aspirin EC 81 MG  tablet Take 1 tablet (81 mg total) by mouth daily.   buPROPion 300 MG 24 hr tablet Commonly known as:  WELLBUTRIN XL Take 1 tablet (300 mg total) by mouth daily.   CALCIUM PO Take 1 tablet by mouth daily.   ciprofloxacin 500 MG tablet Commonly known as:  CIPRO Take 1 tablet (500 mg total) by mouth 2 (two) times daily for 7 days.   cyclobenzaprine 5 MG tablet Commonly known as:  FLEXERIL Take 1-2 tablets (5-10 mg total) by mouth at bedtime.   Estradiol 10 MCG Tabs vaginal tablet Place vaginally.   FISH OIL PO Take 1 tablet by mouth daily.   GLUCOSAMINE-CHONDROITIN PO Take 1 tablet by mouth daily.   lisinopril 20 MG tablet Commonly known as:  PRINIVIL,ZESTRIL Take 1 tablet (20 mg total) by mouth daily.   meloxicam 15 MG tablet Commonly known as:  MOBIC Take 1 tablet (15 mg total) by mouth daily as needed for pain.   metFORMIN 500 MG tablet Commonly known as:  GLUCOPHAGE Take 1 tablet (500 mg total) by mouth 2 (two) times daily with a meal. What changed:    how much to take  how to take this  when to take this  additional instructions   metoprolol succinate 50 MG 24 hr tablet Commonly known as:  TOPROL-XL Take 1 tablet (50 mg total) by mouth daily.   metroNIDAZOLE 500 MG tablet Commonly known as:  FLAGYL Take 1 tablet (500 mg total) by mouth 3 (three) times daily for 7 days.   omeprazole 20 MG capsule Commonly known as:  PRILOSEC Take 1 capsule (20 mg total) by mouth daily.   ondansetron 4 MG tablet Commonly known as:  ZOFRAN Take 1 tablet (4 mg total) by mouth every 6 (six) hours as needed for nausea.   oxyCODONE 5 MG immediate release tablet Commonly known as:  Oxy IR/ROXICODONE Take 1 tablet (5 mg total) by mouth every 4 (four) hours as needed for moderate pain.   simvastatin 20 MG tablet Commonly known as:  ZOCOR Take 1 tablet (20 mg total) by mouth at bedtime. TAKE 1 TABLET BY MOUTH DAILY   VITAMIN B 12 PO Take 1 tablet by mouth daily.    VITAMIN C PO Take 1 tablet by mouth daily.   VITAMIN E PO Take 1 tablet by mouth daily.      Follow-up Information    Trixie Dredge, [place]. Schedule an appointment as soon as possible for a visit in 1 week(s).   Specialty:  Physician Assistant Contact information: [place] [address] [place] [address] [phone]          Allergies  Allergen Reactions  . Penicillins Hives and Rash    Has patient had a PCN reaction causing immediate rash, facial/tongue/throat swelling, SOB or lightheadedness with hypotension: Yes Has patient had a PCN reaction causing severe rash involving mucus membranes or skin necrosis:No Has patient had a PCN reaction that required hospitalization: Yes Has patient had a PCN reaction occurring  within the last 10 years: No If all of the above answers are "NO", then may proceed with Cephalosporin use.     Consultations:  None   Procedures/Studies: Ct Abdomen Pelvis W Contrast  Result Date: 04/07/2018 CLINICAL DATA:  Pt was flush and felt hot last night. Then was clammy, and had massive stomach cramps. Diffuse severe lower abdominal pain especially left lower quadrant, 8 out of 10 intensity. Bowel movement last night had bright red blood. Week, BB near syncopal. Pain and nausea. EXAM: CT ABDOMEN AND PELVIS WITH CONTRAST TECHNIQUE: Multidetector CT imaging of the abdomen and pelvis was performed using the standard protocol following bolus administration of intravenous contrast. CONTRAST:  55mL ISOVUE-300 IOPAMIDOL (ISOVUE-300) INJECTION 61% COMPARISON:  None. FINDINGS: Lower chest: No acute abnormality. Hepatobiliary: Small liver cyst is identified in the LEFT hepatic lobe. No suspicious liver lesion. Gallbladder is present. Pancreas: Unremarkable. No pancreatic ductal dilatation or surrounding inflammatory changes. Spleen: Normal in size without focal abnormality. Adrenals/Urinary Tract: Adrenal glands are unremarkable. Kidneys are  normal, without renal calculi, focal lesion, or hydronephrosis. Bladder is unremarkable. Stomach/Bowel: The stomach and small bowel loops are normal in appearance. The appendix is well seen and has a normal appearance. There is diffusely thickened wall involving the distal transverse, descending, and sigmoid colon. Pericolonic stranding is consistent with inflammatory changes and/or edema. No discrete mass. No evidence for perforation or abscess. Minimal diverticular disease. Vascular/Lymphatic: There is mild atherosclerotic calcification of the abdominal aorta not associated with aneurysm. No retroperitoneal or mesenteric adenopathy. Reproductive: The uterus is present.  No adnexal mass. Other: There is a small amount of free pelvic fluid. Anterior abdominal wall is unremarkable. Musculoskeletal: Degenerative changes are seen in the lumbar spine, with disc height loss and vacuum disc noted at L4-5 and L5-S1. No suspicious lytic or blastic lesions are identified. IMPRESSION: 1. Colonic wall thickening consistent with acute colitis. Considerations include infectious or inflammatory causes, significantly more likely than ischemic causes given the distribution. Findings are atypical for acute diverticulitis. There is minimal diverticulosis. 2.  Aortic atherosclerosis.  (ICD10-I70.0) 3. Normal appendix. 4. Small amount of free pelvic fluid. 5. Benign LEFT liver cyst. Electronically Signed   By: Nolon Nations M.D.   On: 04/07/2018 18:50      Subjective: Patient seen and examined at bedside.  She denies any overnight fever, nausea, vomiting.  Her abdominal pain has much improved.  No bowel movement since admission  Discharge Exam: Vitals:   04/10/18 0240 04/10/18 0557  BP: 120/81 136/90  Pulse: 75 78  Resp:    Temp: 98.4 F (36.9 C) 97.9 F (36.6 C)  SpO2: 95% 95%   Vitals:   04/09/18 1458 04/09/18 2047 04/10/18 0240 04/10/18 0557  BP: 100/65 128/82 120/81 136/90  Pulse: 86 93 75 78  Resp: 19 18     Temp: 98 F (36.7 C) 98 F (36.7 C) 98.4 F (36.9 C) 97.9 F (36.6 C)  TempSrc: Oral Oral Oral Oral  SpO2: 96% 96% 95% 95%  Weight:      Height:        General: Pt is alert, awake, not in acute distress Cardiovascular: Rate controlled, S1/S2 + Respiratory: Bilateral decreased breath sounds at bases Abdominal: Soft, nontender, ND, bowel sounds + Extremities: no edema, no cyanosis    The results of significant diagnostics from this hospitalization (including imaging, microbiology, ancillary and laboratory) are listed below for reference.     Microbiology: Recent Results (from the past 240 hour(s))  Culture, blood (Routine  X 2) w Reflex to ID Panel     Status: None (Preliminary result)   Collection Time: 04/07/18  9:28 PM  Result Value Ref Range Status   Specimen Description   Final    BLOOD RIGHT ANTECUBITAL Performed at [facility], [place] [address] [place], [place] [address]    Special Requests   Final    BOTTLES DRAWN AEROBIC AND ANAEROBIC Blood Culture adequate volume Performed at [facility] [address], [place], [place] [address]    Culture   Final    NO GROWTH 1 DAY Performed at [facility], [place] [address] [place], [place] [address]    Report Status PENDING  Incomplete  Culture, blood (Routine X 2) w Reflex to ID Panel     Status: None (Preliminary result)   Collection Time: 04/07/18  9:33 PM  Result Value Ref Range Status   Specimen Description   Final    BLOOD LEFT HAND Performed at [facility] [address] [place], [place] [address]    Special Requests   Final    BOTTLES DRAWN AEROBIC ONLY Blood Culture adequate volume Performed at [facility] [address] [place], [place] [address]    Culture   Final    NO GROWTH 1 DAY Performed at [facility], [place] [address] [place], [place] [address]    Report Status PENDING  Incomplete     Labs: BNP (last 3  results) No results for input(s): BNP in the last 8760 hours. Basic Metabolic Panel: Recent Labs  Lab 04/07/18 1608 04/08/18 0353 04/09/18 0515 04/10/18 0607  NA 131* 139 141 141  K 4.2 3.9 4.0 4.0  CL 99* 112* 111 111  CO2 22 19* 18* 23  GLUCOSE 132* 99 91 98  BUN 34* 17 6 <5*  CREATININE 1.65* 1.01* 0.79 0.90  CALCIUM 8.7* 7.9* 8.3* 8.8*  MG  --   --  1.6* 1.6*   Liver Function Tests: Recent Labs  Lab 04/07/18 1608  AST 18  ALT 17  ALKPHOS 47  BILITOT 0.8  PROT 6.8  ALBUMIN 3.8   No results for input(s): LIPASE, AMYLASE in the last 168 hours. No results for input(s): AMMONIA in the last 168 hours. CBC: Recent Labs  Lab 04/07/18 2124 04/08/18 0353 04/08/18 0950 04/09/18 0515 04/10/18 0607  WBC 9.8 8.8 8.2 7.0 7.4  NEUTROABS  --   --   --  3.2 3.4  HGB 11.9* 10.8* 10.7* 10.7* 11.5*  HCT 36.6 33.4* 33.3* 34.0* 36.3  MCV 96.6 96.5 97.4 98.8 98.4  PLT 225 206 202 218 263   Cardiac Enzymes: No results for input(s): CKTOTAL, CKMB, CKMBINDEX, TROPONINI in the last 168 hours. BNP: Invalid input(s): POCBNP CBG: Recent Labs  Lab 04/08/18 0733 04/09/18 0738 04/10/18 0735  GLUCAP 97 101* 134*   D-Dimer No results for input(s): DDIMER in the last 72 hours. Hgb A1c No results for input(s): HGBA1C in the last 72 hours. Lipid Profile No results for input(s): CHOL, HDL, LDLCALC, TRIG, CHOLHDL, LDLDIRECT in the last 72 hours. Thyroid function studies No results for input(s): TSH, T4TOTAL, T3FREE, THYROIDAB in the last 72 hours.  Invalid input(s): FREET3 Anemia work up No results for input(s): VITAMINB12, FOLATE, FERRITIN, TIBC, IRON, RETICCTPCT in the last 72 hours. Urinalysis No results found for: COLORURINE, APPEARANCEUR, LABSPEC, [place], GLUCOSEU, HGBUR, BILIRUBINUR, KETONESUR, PROTEINUR, UROBILINOGEN, NITRITE, LEUKOCYTESUR Sepsis Labs Invalid input(s): PROCALCITONIN,  WBC,  LACTICIDVEN  Microbiology Recent Results (from the past 240 hour(s))  Culture,  blood (Routine X 2) w Reflex to ID Panel     Status: None (Preliminary result)   Collection Time: 04/07/18  9:28 PM  Result Value Ref Range Status   Specimen Description   Final    BLOOD RIGHT ANTECUBITAL Performed at [facility], [place] [address] [place], [place] [address]    Special Requests   Final    BOTTLES DRAWN AEROBIC AND ANAEROBIC Blood Culture adequate volume Performed at [facility] [address] [place], [place] [address]    Culture   Final    NO GROWTH 1 DAY Performed at [facility], [place] [address] [place], [place] [address]    Report Status PENDING  Incomplete  Culture, blood (Routine X 2) w Reflex to ID Panel     Status: None (Preliminary result)   Collection Time: 04/07/18  9:33 PM  Result Value Ref Range Status   Specimen Description   Final    BLOOD LEFT HAND Performed at [facility] [address], [place], [place] [address]    Special Requests   Final    BOTTLES DRAWN AEROBIC ONLY Blood Culture adequate volume Performed at [facility] [address], [place], [place] [address]    Culture   Final    NO GROWTH 1 DAY Performed at [facility], [place] [address], [place],  [address]    Report Status PENDING  Incomplete     Time coordinating discharge: 35 minutes  SIGNED:   Aline August, MD  [facility] 04/10/2018, 10:02 AM Pager: [phone]  If 7PM-7AM, please contact night-coverage [email or web address] Password [email or web address]

## 2018-04-13 ENCOUNTER — Encounter (HOSPITAL_COMMUNITY): Payer: Self-pay | Admitting: Psychiatry

## 2018-04-13 ENCOUNTER — Encounter: Payer: Self-pay | Admitting: Physician Assistant

## 2018-04-13 ENCOUNTER — Ambulatory Visit (INDEPENDENT_AMBULATORY_CARE_PROVIDER_SITE_OTHER): Payer: No Typology Code available for payment source | Admitting: Psychiatry

## 2018-04-13 ENCOUNTER — Ambulatory Visit (INDEPENDENT_AMBULATORY_CARE_PROVIDER_SITE_OTHER): Payer: No Typology Code available for payment source | Admitting: Physician Assistant

## 2018-04-13 VITALS — BP 127/83 | HR 69 | Ht 66.0 in | Wt 195.0 lb

## 2018-04-13 VITALS — BP 127/84 | HR 66 | Temp 98.1°F | Wt 191.0 lb

## 2018-04-13 DIAGNOSIS — F339 Major depressive disorder, recurrent, unspecified: Secondary | ICD-10-CM | POA: Diagnosis not present

## 2018-04-13 DIAGNOSIS — F17210 Nicotine dependence, cigarettes, uncomplicated: Secondary | ICD-10-CM | POA: Diagnosis not present

## 2018-04-13 DIAGNOSIS — F1010 Alcohol abuse, uncomplicated: Secondary | ICD-10-CM | POA: Diagnosis not present

## 2018-04-13 DIAGNOSIS — K529 Noninfective gastroenteritis and colitis, unspecified: Secondary | ICD-10-CM

## 2018-04-13 DIAGNOSIS — Z09 Encounter for follow-up examination after completed treatment for conditions other than malignant neoplasm: Secondary | ICD-10-CM | POA: Diagnosis not present

## 2018-04-13 DIAGNOSIS — F418 Other specified anxiety disorders: Secondary | ICD-10-CM | POA: Diagnosis not present

## 2018-04-13 DIAGNOSIS — D62 Acute posthemorrhagic anemia: Secondary | ICD-10-CM

## 2018-04-13 DIAGNOSIS — F3341 Major depressive disorder, recurrent, in partial remission: Secondary | ICD-10-CM

## 2018-04-13 DIAGNOSIS — K7689 Other specified diseases of liver: Secondary | ICD-10-CM | POA: Diagnosis not present

## 2018-04-13 LAB — CULTURE, BLOOD (ROUTINE X 2)
CULTURE: NO GROWTH
CULTURE: NO GROWTH
SPECIAL REQUESTS: ADEQUATE
SPECIAL REQUESTS: ADEQUATE

## 2018-04-13 MED ORDER — MAGNESIUM GLYCINATE POWD: 400.0000 mg | Freq: Every day | 0 refills | Status: DC

## 2018-04-13 MED ORDER — ARIPIPRAZOLE 10 MG PO TABS: 10.0000 mg | ORAL_TABLET | Freq: Every day | ORAL | 1 refills | Status: DC

## 2018-04-13 MED ORDER — BUPROPION HCL ER (XL) 300 MG PO TB24: 300.0000 mg | ORAL_TABLET | Freq: Every day | ORAL | 1 refills | Status: DC

## 2018-04-13 MED FILL — BUPROPION HCL XL 300 MG TAB: 300 | 90 days supply | Qty: 90 | Fill #0

## 2018-04-13 NOTE — Progress Notes (Addendum)
Psychiatric Initial Adult Assessment   Patient Identification: Mary Stark MRN:  [id number] Date of Evaluation:  04/13/2018 Referral Source: Dr. Sherlie Ban Chief Complaint:   Visit Diagnosis:    ICD-10-CM   1. Depression with anxiety F41.8 ARIPiprazole (ABILIFY) 10 MG tablet  2. Recurrent major depressive disorder, in partial remission (HCC) F33.41 buPROPion (WELLBUTRIN XL) 300 MG 24 hr tablet    History of Present Illness:    This patient is a 62 year old married female who's a regular is a long history of being treated with an antidepressant. She is married to her husband in her second marriage with him. Presently he lives in his own home away from her. He's involving taking care of his mother. Patient seems to have a good relationship with. She still emotionally connected to is not physically living with her. The patient has one daughter who is 63 years of age who lives with her. This daughter works. She's emotionally disconnected. The daughter has 2 children ages 30 and 50 both live with patient as well. The patient's daughter's boyfriend helps with a weekend activities. The patient works every weekend as a Equities trader. In close evaluation she denies daily depression.. She sleeping and eating fairly well. She's got good energy. She has no problems thinking or concentrating. Her self-esteem is somewhat low but she is not worthless. She denies being suicidal now and never really made a suicide attempt at all. She had a lot of suicidal considerations when she was in her 18s. The patient enjoys reading knitting quilting music and plays games on the computer.The patient has significant past history of alcohol dependency. She's beenhospitalized to inpatient centers for alcohol in her life. Approximately 10 years ago she stopped drinking significantly. Presently she drinks about one sixpack a week at one time. Luckily she was drinking 2 sixpacks later on a regular basis. She currently had  pancreatitis was confronted by her primary care doctor stopped drinking significantly at that time. The patient denies the use of any illicit drugs. She's never been psychotic. It is hard to get a distinct. Of major depression. However about 10 years ago she began with Abilify in addition to her Wellbutrin. She said he clearly helps as reflected by the fact that she no longer was suicidal he became more active. At one point attempted to stop her Wellbutrin for reasons that are not clear. The patient implies that her doctor was concerned about using it while she was drinking but she was hardly drinking much. Nonetheless she was changed to Trintelix which she took for a few weeks. Eventually the patient started getting more and more depressed and went back to Wellbutrin and now feels more back to baseline. I suspect that the combination of Wellbutrin and Abilify seizure a reasonable baseline. She will describe herself as he clearly well. She denies ever having symptoms of mania.She denies symptoms of any anxiety disorder.The patient is never been in a psychiatric hospital. She's never had a meaningful relationship with a psychiatrist. She has been there many years ago.The patient has a past Which Failed, Zoloft Produced a Fall Stay and Really Has Done Well on Wellbutrin. The Patient Shows No Evidence of Tardive Dyskinesia. The patient denies being suicidal this time. She is well-controlled hypertension and hypercholesterolemia.  Associated Signs/Symptoms: Depression Symptoms:  feelings of worthlessness/guilt, (Hypo) Manic Symptoms:   Anxiety Symptoms:   Psychotic Symptoms:   PTSD Symptoms:   Past Psychiatric History: multiple presents now taking Abilify 10 mg Wellbutrin  300 mg  Previous Psychotropic Medications: as above  Substance Abuse History in the last 12 months:  Yes.    Consequences of Substance Abuse: Medical Consequences:    Past Medical History:  Past Medical History:  Diagnosis Date   . Alcohol dependence ([place])   . Depression 07/17/2013  . History of hepatitis C 07/17/2013  . History of rheumatic fever   . Hyperlipidemia 07/17/2013  . Hypertension   . Tobacco use   . Toe fracture, right 10/07/2016  . Vitamin D deficiency 07/17/2013    Past Surgical History:  Procedure Laterality Date  . CESAREAN SECTION  1984  . FINGER SURGERY Right    5th  . GYNECOLOGIC CRYOSURGERY    . TONSILLECTOMY      Family Psychiatric History:   Family History:  Family History  Problem Relation Age of Onset  . Ovarian cancer Mother   . Cardiomyopathy Father   . Valvular heart disease Father   . Liver cancer Brother   . Protein C deficiency Brother   . Protein C deficiency Brother   . Stroke Brother     Social History:   Social History   Socioeconomic History  . Marital status: Married    Spouse name: Not on file  . Number of children: Not on file  . Years of education: Not on file  . Highest education level: Not on file  Occupational History  . Not on file  Social Needs  . Financial resource strain: Not on file  . Food insecurity:    Worry: Not on file    Inability: Not on file  . Transportation needs:    Medical: Not on file    Non-medical: Not on file  Tobacco Use  . Smoking status: Current Some Day Smoker    Packs/day: 1.00    Years: 25.00    Pack years: 25.00    Types: Cigarettes    Last attempt to quit: 12/17/2012    Years since quitting: 5.3  . Smokeless tobacco: Never Used  . Tobacco comment: <1 cig per month  Substance and Sexual Activity  . Alcohol use: Yes    Alcohol/week: 14.4 - 21.6 oz    Types: 24 - 36 Cans of beer per week  . Drug use: No  . Sexual activity: Yes    Birth control/protection: None  Lifestyle  . Physical activity:    Days per week: Not on file    Minutes per session: Not on file  . Stress: Not on file  Relationships  . Social connections:    Talks on phone: Not on file    Gets together: Not on file    Attends religious  service: Not on file    Active member of club or organization: Not on file    Attends meetings of clubs or organizations: Not on file    Relationship status: Not on file  Other Topics Concern  . Not on file  Social History Narrative  . Not on file    Additional Social History:   Allergies:   Allergies  Allergen Reactions  . Metformin And Related Other (See Comments)    Lactic acidosis   . Penicillins Hives and Rash    Has patient had a PCN reaction causing immediate rash, facial/tongue/throat swelling, SOB or lightheadedness with hypotension: Yes Has patient had a PCN reaction causing severe rash involving mucus membranes or skin necrosis:No Has patient had a PCN reaction that required hospitalization: Yes Has patient had a PCN reaction occurring within the  last 10 years: No If all of the above answers are "NO", then may proceed with Cephalosporin use.     Metabolic Disorder Labs: Lab Results  Component Value Date   HGBA1C 5.6 02/07/2013   No results found for: PROLACTIN Lab Results  Component Value Date   CHOL 183 01/10/2018   TRIG 79 01/10/2018   HDL 83 01/10/2018   CHOLHDL 2.2 01/10/2018   VLDL 20 08/30/2013   LDLCALC 84 01/10/2018   LDLCALC 78 08/30/2013     Current Medications: Current Outpatient Medications  Medication Sig Dispense Refill  . albuterol (PROVENTIL HFA;VENTOLIN HFA) 108 (90 Base) MCG/ACT inhaler Inhale 1-2 puffs into the lungs every 4 (four) hours as needed for wheezing or shortness of breath. 1 Inhaler 1  . ARIPiprazole (ABILIFY) 10 MG tablet Take 1 tablet (10 mg total) by mouth at bedtime. 90 tablet 1  . Ascorbic Acid (VITAMIN C PO) Take 1 tablet by mouth daily.    Marland Kitchen aspirin EC 81 MG tablet Take 1 tablet (81 mg total) by mouth daily. 90 tablet 3  . buPROPion (WELLBUTRIN XL) 300 MG 24 hr tablet Take 1 tablet (300 mg total) by mouth daily. 90 tablet 1  . CALCIUM PO Take 1 tablet by mouth daily.    . ciprofloxacin (CIPRO) 500 MG tablet Take 1  tablet (500 mg total) by mouth 2 (two) times daily for 7 days. 14 tablet 0  . Cyanocobalamin (VITAMIN B 12 PO) Take 1 tablet by mouth daily.    . cyclobenzaprine (FLEXERIL) 5 MG tablet Take 1-2 tablets (5-10 mg total) by mouth at bedtime. 30 tablet 1  . Estradiol 10 MCG TABS vaginal tablet Place vaginally.    Marland Kitchen GLUCOSAMINE-CHONDROITIN PO Take 1 tablet by mouth daily.    Marland Kitchen lisinopril (PRINIVIL,ZESTRIL) 20 MG tablet Take 1 tablet (20 mg total) by mouth daily. 90 tablet 1  . Magnesium Glycinate POWD 400 mg by Does not apply route daily.  0  . metoprolol succinate (TOPROL-XL) 50 MG 24 hr tablet Take 1 tablet (50 mg total) by mouth daily. 90 tablet 1  . metroNIDAZOLE (FLAGYL) 500 MG tablet Take 1 tablet (500 mg total) by mouth 3 (three) times daily for 7 days. 21 tablet 0  . Omega-3 Fatty Acids (FISH OIL PO) Take 1 tablet by mouth daily.    Marland Kitchen omeprazole (PRILOSEC) 20 MG capsule Take 1 capsule (20 mg total) by mouth daily. 90 capsule 1  . ondansetron (ZOFRAN) 4 MG tablet Take 1 tablet (4 mg total) by mouth every 6 (six) hours as needed for nausea. 20 tablet 0  . oxyCODONE (OXY IR/ROXICODONE) 5 MG immediate release tablet Take 1 tablet (5 mg total) by mouth every 4 (four) hours as needed for moderate pain. 14 tablet 0  . simvastatin (ZOCOR) 20 MG tablet Take 1 tablet (20 mg total) by mouth at bedtime. TAKE 1 TABLET BY MOUTH DAILY 90 tablet 2  . VITAMIN E PO Take 1 tablet by mouth daily.     No current facility-administered medications for this visit.     Neurologic: Headache: No Seizure: No Paresthesias:No  Musculoskeletal: Strength & Muscle Tone: within normal limits Gait & Station: normal Patient leans: N/A  Psychiatric Specialty Exam: ROS  Blood pressure 127/83, pulse 69, height 5\' 6"  (1.676 m), weight 195 lb (88.5 kg), SpO2 99 %.Body mass index is 31.47 kg/m.  General Appearance: Casual  Eye Contact:  Good  Speech:  NA  Volume:  Normal  Mood:  Negative  Affect:  Appropriate   Thought Process:  Goal Directed  Orientation:  Full (Time, Place, and Person)  Thought Content:  Logical  Suicidal Thoughts:  No  Homicidal Thoughts:  No  Memory:  NA Immediate;   Good  Judgement:  Good  Insight:  Good  Psychomotor Activity:  NA  Concentration:    Recall:  Good  Fund of Knowledge:Good  Language: Good  Akathisia:  No  Handed:  Right  AIMS (if indicated):    Assets:  Desire for Improvement  ADL's:  Intact  Cognition: WNL  Sleep:      Treatment Plan Summary:  At this time as best can be determined patient likely has major depression. While she cannot articulate a clear episode of major depression fact that she's felt better on Wellbutrin and then further improved when she took Abilify implies that she is responding to these agents. Further when she stopped her Wellbutrin she clearly started to feel worse. She Is off Abilify many years ago she clearly felt worse implying that she fell withdrawn isolated loss her energy level and considered suicide. In essence when she takes his medicines she's had a baseline that is intolerable existence. I think the most important intervention would be for her to enter into therapy. She's on dynamic issues. While she had a relatively normal childhood and no evidence of abuse she clearly is in a conflictual relationship with her daughter in her own home. She describes the presence of her 2 grandchildren ages 66 and 81 is feeling more like the way. Everybody's getting on her. 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 She likes being at work. At this time I recommended she continue taking well 300 mg and continue Abilify 10 mg. The possibility over the time of known her come off of Abilify will be reevaluated it.this patient to return to see me in 2-1/2 months.this patient will be given the contacts for [facility] for therapist there. This is closer to the patient's home. She'll return  to see me but should start therapy hopefully closer to home   Jerral Ralph, MD 5/1/20191:49 PM

## 2018-04-13 NOTE — Progress Notes (Signed)
HPI:                                                                Mary Stark is a 62 y.o. female who presents to [facility] [place] today for hospital follow-up  Patient was admitted to [facility] 04/07/18-04/10/18  for acute colitis, lactic acidosis and AKI. She presented with hematochezia and loose stools. CT abdomen revealed acute colitis. She was treated with IV antibiotics and discharged on Cipro and Flagyl. Reports she had a normal BM yesterday. Denies fever, chills, nausea, vomiting, chest pain, dyspnea.  ED Course: pt was found to have WBC 12.5, lactic acid of 1.59, INR 1.01, PTT 30, positive FOBT, acute renal injury with creatinine 1.65, BUN 34, temperature normal, hypotension with blood pressure 80/62, no tachycardia, no tachypnea, oxygen saturation 100% on room air.   Past Medical History:  Diagnosis Date  . Alcohol dependence ([place])   . Depression 07/17/2013  . History of hepatitis C 07/17/2013  . History of rheumatic fever   . Hyperlipidemia 07/17/2013  . Hypertension   . Tobacco use   . Toe fracture, right 10/07/2016  . Vitamin D deficiency 07/17/2013   Past Surgical History:  Procedure Laterality Date  . CESAREAN SECTION  1984  . FINGER SURGERY Right    5th  . GYNECOLOGIC CRYOSURGERY    . TONSILLECTOMY     Social History   Tobacco Use  . Smoking status: Current Some Day Smoker    Packs/day: 1.00    Years: 25.00    Pack years: 25.00    Types: Cigarettes    Last attempt to quit: 12/17/2012    Years since quitting: 5.3  . Smokeless tobacco: Never Used  . Tobacco comment: <1 cig per month  Substance Use Topics  . Alcohol use: Yes    Alcohol/week: 14.4 - 21.6 oz    Types: 24 - 36 Cans of beer per week   family history includes Cardiomyopathy in her father; Liver cancer in her brother; Ovarian cancer in her mother; Protein C deficiency in her brother and brother; Stroke in her brother; Valvular heart disease in  her father.    ROS: negative except as noted in the HPI  Medications: Current Outpatient Medications  Medication Sig Dispense Refill  . albuterol (PROVENTIL HFA;VENTOLIN HFA) 108 (90 Base) MCG/ACT inhaler Inhale 1-2 puffs into the lungs every 4 (four) hours as needed for wheezing or shortness of breath. 1 Inhaler 1  . ARIPiprazole (ABILIFY) 10 MG tablet Take 1 tablet (10 mg total) by mouth at bedtime. 90 tablet 1  . Ascorbic Acid (VITAMIN C PO) Take 1 tablet by mouth daily.    Marland Kitchen aspirin EC 81 MG tablet Take 1 tablet (81 mg total) by mouth daily. 90 tablet 3  . buPROPion (WELLBUTRIN XL) 300 MG 24 hr tablet Take 1 tablet (300 mg total) by mouth daily. 90 tablet 1  . CALCIUM PO Take 1 tablet by mouth daily.    . ciprofloxacin (CIPRO) 500 MG tablet Take 1 tablet (500 mg total) by mouth 2 (two) times daily for 7 days. 14 tablet 0  . Cyanocobalamin (VITAMIN B 12 PO) Take 1 tablet by mouth daily.    . cyclobenzaprine (FLEXERIL) 5 MG  tablet Take 1-2 tablets (5-10 mg total) by mouth at bedtime. 30 tablet 1  . Estradiol 10 MCG TABS vaginal tablet Place vaginally.    Marland Kitchen GLUCOSAMINE-CHONDROITIN PO Take 1 tablet by mouth daily.    Marland Kitchen lisinopril (PRINIVIL,ZESTRIL) 20 MG tablet Take 1 tablet (20 mg total) by mouth daily. 90 tablet 1  . metoprolol succinate (TOPROL-XL) 50 MG 24 hr tablet Take 1 tablet (50 mg total) by mouth daily. 90 tablet 1  . metroNIDAZOLE (FLAGYL) 500 MG tablet Take 1 tablet (500 mg total) by mouth 3 (three) times daily for 7 days. 21 tablet 0  . Omega-3 Fatty Acids (FISH OIL PO) Take 1 tablet by mouth daily.    Marland Kitchen omeprazole (PRILOSEC) 20 MG capsule Take 1 capsule (20 mg total) by mouth daily. 90 capsule 1  . ondansetron (ZOFRAN) 4 MG tablet Take 1 tablet (4 mg total) by mouth every 6 (six) hours as needed for nausea. 20 tablet 0  . oxyCODONE (OXY IR/ROXICODONE) 5 MG immediate release tablet Take 1 tablet (5 mg total) by mouth every 4 (four) hours as needed for moderate pain. 14 tablet 0   . simvastatin (ZOCOR) 20 MG tablet Take 1 tablet (20 mg total) by mouth at bedtime. TAKE 1 TABLET BY MOUTH DAILY 90 tablet 2  . VITAMIN E PO Take 1 tablet by mouth daily.     No current facility-administered medications for this visit.    Allergies  Allergen Reactions  . Metformin And Related Other (See Comments)    Lactic acidosis   . Penicillins Hives and Rash    Has patient had a PCN reaction causing immediate rash, facial/tongue/throat swelling, SOB or lightheadedness with hypotension: Yes Has patient had a PCN reaction causing severe rash involving mucus membranes or skin necrosis:No Has patient had a PCN reaction that required hospitalization: Yes Has patient had a PCN reaction occurring within the last 10 years: No If all of the above answers are "NO", then may proceed with Cephalosporin use.        Objective:  BP 127/84   Pulse 66   Temp 98.1 F (36.7 C) (Oral)   Wt 191 lb (86.6 kg)   BMI 30.83 kg/m  Gen:  alert, not ill-appearing, no distress, appropriate for age 57: head normocephalic without obvious abnormality, conjunctiva and cornea clear, wearing glasses, trachea midline Pulm: Normal work of breathing, normal phonation, clear to auscultation bilaterally, no wheezes, rales or rhonchi CV: Normal rate, regular rhythm, s1 and s2 distinct, no murmurs, clicks or rubs  GI: abdomen soft, there is RLQ tenderness, mild guarding, no rebound tenderness or rigidity Neuro: alert and oriented x 3, no tremor MSK: extremities atraumatic, normal gait and station Skin: intact, no rashes on exposed skin, no jaundice, no cyanosis Psych: well-groomed, cooperative, good eye contact, euthymic mood, affect mood-congruent, speech is articulate, and thought processes clear and goal-directed  Lab Results  Component Value Date   WBC 7.4 04/10/2018   HGB 11.5 (L) 04/10/2018   HCT 36.3 04/10/2018   MCV 98.4 04/10/2018   PLT 263 04/10/2018   Lab Results  Component Value Date    CREATININE 0.90 04/10/2018   BUN <5 (L) 04/10/2018   NA 141 04/10/2018   K 4.0 04/10/2018   CL 111 04/10/2018   CO2 23 04/10/2018   Lab Results  Component Value Date   ALT 17 04/07/2018   AST 18 04/07/2018   ALKPHOS 47 04/07/2018   BILITOT 0.8 04/07/2018     No results  found for this or any previous visit (from the past 33 hour(s)). No results found.    Assessment and Plan: 62 y.o. female with  Hospital discharge follow-up  Acute colitis  Liver cyst  Anemia due to blood loss, acute - Plan: CBC with Differential/Platelet, Iron, TIBC and Ferritin Panel  Hypomagnesemia - Plan: Magnesium Glycinate POWD, Magnesium  Hypocalcemia - Plan: BASIC METABOLIC PANEL WITH GFR  - vital signs reviewed and stable, no hypotension or tachycardia. RLQ tenderness on exam, no peritoneal signs or fever - cont Cipro and Flagyl - hold NSAIDs - start Magnesium supplement - rechecking labs in 10 days to ensure anemia and metabolic abnormalities have improved - patient would like to defer referral to GI. Close follow-up with me. As long as there is no additional hematochezia or decrease in Hgb we can defer GI referral   Patient education and anticipatory guidance given Patient agrees with treatment plan Follow-up in 10 days or soner as needed if symptoms worsen or fail to improve  Darlyne Russian PA-C

## 2018-04-13 NOTE — Patient Instructions (Addendum)
-   cont Cipro and Flagyl - cont Omeprazole daily - hold all NSAIDs and Meloxicam - start a Magnesium supplement: Magnesium glycinate Magnesium orotate Magnesium L-threonate

## 2018-04-14 ENCOUNTER — Encounter: Payer: Self-pay | Admitting: Physician Assistant

## 2018-04-14 DIAGNOSIS — K7689 Other specified diseases of liver: Secondary | ICD-10-CM | POA: Insufficient documentation

## 2018-04-14 DIAGNOSIS — D62 Acute posthemorrhagic anemia: Secondary | ICD-10-CM | POA: Insufficient documentation

## 2018-04-15 MED FILL — METOPROLOL SUCCINATE ER 50: 50 | 30 days supply | Qty: 30 | Fill #2

## 2018-04-18 MED FILL — ARIPiprazole 10 MG TABS: 10 | 90 days supply | Qty: 90 | Fill #0

## 2018-04-19 ENCOUNTER — Ambulatory Visit (INDEPENDENT_AMBULATORY_CARE_PROVIDER_SITE_OTHER): Payer: No Typology Code available for payment source | Admitting: Physical Therapy

## 2018-04-19 ENCOUNTER — Encounter: Payer: Self-pay | Admitting: Physical Therapy

## 2018-04-19 DIAGNOSIS — R29898 Other symptoms and signs involving the musculoskeletal system: Secondary | ICD-10-CM

## 2018-04-19 DIAGNOSIS — M25511 Pain in right shoulder: Secondary | ICD-10-CM

## 2018-04-19 DIAGNOSIS — M6281 Muscle weakness (generalized): Secondary | ICD-10-CM

## 2018-04-19 NOTE — Therapy (Signed)
[facility] [place], [place], [address] Phone: [phone]   Fax:  [phone]  Physical Therapy Evaluation  Patient Details  Name: Mary Stark MRN: [id number] Date of Birth: [birth year] Referring Provider: Nelson Chimes PA   Encounter Date: 04/19/2018  PT End of Session - 04/19/18 1148    Visit Number  1    Number of Visits  4    Date for PT Re-Evaluation  05/17/18    PT Start Time  3785    PT Stop Time  1230    PT Time Calculation (min)  41 min       Past Medical History:  Diagnosis Date  . Alcohol dependence ([place])   . Depression 07/17/2013  . History of hepatitis C 07/17/2013  . History of rheumatic fever   . Hyperlipidemia 07/17/2013  . Hypertension   . Tobacco use   . Toe fracture, right 10/07/2016  . Vitamin D deficiency 07/17/2013    Past Surgical History:  Procedure Laterality Date  . CESAREAN SECTION  1984  . FINGER SURGERY Right    5th  . GYNECOLOGIC CRYOSURGERY    . TONSILLECTOMY      There were no vitals filed for this visit.   Subjective Assessment - 04/19/18 1149    Subjective  Pt reports about a month ago her Rt shoulder began to hurt more.  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     Pertinent History  3 yr ago broken Rt humerous casted and healed on its own,     Diagnostic tests  old xray/MRI    Patient Stated Goals  relearn her exercise that she used to do because it helped.     Currently in Pain?  Yes    Pain Score  3  before pain meds 7/10    Pain Location  Shoulder    Pain Orientation  Right    Pain Descriptors / Indicators  Stabbing    Pain Onset  More than a month ago    Pain Frequency  Constant    Aggravating Factors   reaching behind/dressing    Pain Relieving Factors  medication, icy hot         [facility] PT Assessment - 04/19/18 0001      Assessment   Medical Diagnosis  Rt shoulder pain    Referring  Provider  Nelson Chimes PA    Onset Date/Surgical Date  03/26/18    Hand Dominance  Right    Next MD Visit  Dr Georgina Snell next week    Prior Therapy  not for this      Precautions   Precautions  None      Balance Screen   Has the patient fallen in the past 6 months  No      Prior Function   Level of Independence  Independent with pain    Vocation  Full time employment    Vocation Requirements  ICU nurse    Leisure  play games, quilt      Observation/Other Assessments   Focus on Therapeutic Outcomes (FOTO)   36% limited      Posture/Postural Control   Posture/Postural Control  Postural limitations    Postural Limitations  Forward head;Rounded Shoulders;Increased thoracic kyphosis      ROM / Strength   AROM / PROM / Strength  AROM;Strength      AROM  AROM Assessment Site  Cervical;Shoulder    Right/Left Shoulder  Right Lt WNL,     Right Shoulder Extension  -- WNL    Right Shoulder Flexion  -- WNLpain returning down.     Right Shoulder ABduction  -- WNL pain returning down    Right Shoulder Internal Rotation  29 Degrees    Right Shoulder External Rotation  78 Degrees    Cervical Flexion  to chest    Cervical Extension  50    Cervical - Right Rotation  67    Cervical - Left Rotation  62      Strength   Overall Strength Comments  -- mid back WNL, low back Lt WNL, Rt 4/5    Strength Assessment Site  Shoulder;Elbow    Right/Left Shoulder  Right Lt WNL    Right Shoulder Flexion  5/5    Right Shoulder Extension  5/5    Right Shoulder ABduction  5/5    Right Shoulder Internal Rotation  4+/5    Right Shoulder External Rotation  4/5    Right/Left Elbow  -- WNL      Flexibility   Soft Tissue Assessment /Muscle Length  -- tight pecs  bilat      Palpation   Palpation comment  tender just off the Rt acromion      Special Tests    Special Tests  Rotator Cuff Impingement    Rotator Cuff Impingment tests  Michel Bickers test;Empty Can test      Hawkins-Kennedy test    Findings  Positive    Side  Right      Empty Can test   Findings  Positive    Side  Right                Objective measurements completed on examination: See above findings.      [facility] Adult PT Treatment/Exercise - 04/19/18 0001      Exercises   Exercises  Shoulder      Shoulder Exercises: Standing   External Rotation  Strengthening;Right;Theraband 3x10 with towel under arm    Theraband Level (Shoulder External Rotation)  Level 2 (Red)    Internal Rotation  Strengthening;Right;Theraband 3x10 with towel under arm      Shoulder Exercises: Stretch   Other Shoulder Stretches  low andmid doorway stretch 2x 30sec each             PT Education - 04/19/18 1223    Education provided  Yes    Education Details  HEP and Systems developer          PT Long Term Goals - 04/19/18 1246      PT LONG TERM GOAL #1   Title  I with HEP ( 05/17/18)     Time  4    Period  Weeks    Status  New      PT LONG TERM GOAL #2   Title  demo Rt shoulder rotation WNL to allow her to hook her bra/donn seatbelt without difficulty  (05/17/18)    Time  4    Period  Weeks    Status  New      PT LONG TERM GOAL #3   Title  report ability to perform bathing/dressing without pain in the Rt shoulder ( 05/17/18)     Time  4    Period  Weeks    Status  New      PT LONG TERM GOAL #4   Title  demo Rt shoulder rotation strength =/> 5-/5 without pain ( 05/17/18)      Time  4    Period  Weeks    Status  New      PT LONG TERM GOAL #5   Title  improve FOTO =/< 33% limited ( 05/17/18)     Time  4    Period  Weeks    Status  New             Plan - 04/19/18 1243    Clinical Impression Statement  62 yo female with ~ 2 month h/o Rt shoulder pain.  This is worse after she works her weekend option nursing job.  Currently she is having pain with bathing and dressing, has decreased strength and impaired mechanics with reaching. She is scheduled to see sports medicine MD next week and is wanting  to have an injection.     History and Personal Factors relevant to plan of care:  h/o rt humerous closed reduction fx.     Clinical Presentation  Stable    Clinical Decision Making  Low    Rehab Potential  Excellent    PT Frequency  1x / week    PT Duration  4 weeks    PT Treatment/Interventions  Iontophoresis 4mg /ml Dexamethasone;Dry needling;Manual techniques;Moist Heat;Ultrasound;Patient/family education;Taping;Vasopneumatic Device;Therapeutic exercise;Cryotherapy;Electrical Stimulation;Passive range of motion    PT Next Visit Plan  progress postural ed ex and RTC strengthening     Consulted and Agree with Plan of Care  Patient       Patient will benefit from skilled therapeutic intervention in order to improve the following deficits and impairments:  Pain, Improper body mechanics, Postural dysfunction, Impaired UE functional use, Decreased strength, Decreased range of motion, Increased edema  Visit Diagnosis: Acute pain of right shoulder - Plan: PT plan of care cert/re-cert  Muscle weakness (generalized) - Plan: PT plan of care cert/re-cert  Other symptoms and signs involving the musculoskeletal system - Plan: PT plan of care cert/re-cert     Problem List Patient Active Problem List   Diagnosis Date Noted  . 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/2018  . Anemia due to blood loss, acute 04/14/2018  . Hypomagnesemia 04/14/2018  . Hypocalcemia 04/14/2018  . Tobacco abuse 04/08/2018  . Depression 04/07/2018  . AKI (acute kidney injury) ([place]) 04/07/2018  . Sepsis ([place]) 04/07/2018  . Acute colitis 04/07/2018  . Avascular necrosis of humeral head, right ([place]) 03/30/2018  . NSAID long-term use 03/30/2018  . Encounter for weight loss counseling 03/30/2018  . Seborrheic keratoses 03/15/2018  . History of nonmelanoma skin cancer 03/02/2018  . Skin lesion of chest wall 03/02/2018  . Alcohol dependence with unspecified alcohol-induced disorder ([place]) 02/11/2018  . Severe episode of recurrent major  depressive disorder, without psychotic features ([place]) 02/11/2018  . Transaminitis 01/06/2018  . Nicotine dependence 01/06/2018  . Heavy alcohol consumption 01/06/2018  . Encounter for monitoring statin therapy 01/06/2018  . Class 1 obesity due to excess calories with serious comorbidity in adult 01/06/2018  . Chronic pain syndrome 04/21/2017  . Chronic right shoulder pain 07/03/2015  . Vaginismus 04/12/2014  . Essential hypertension with goal blood pressure less than 140/90 01/17/2014  . Menopausal state 11/08/2013  . Family history of ovarian cancer 09/27/2013  . 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 Personal history of colonic polyps 07/18/2013  . Dyslipidemia, goal LDL below 100 07/17/2013  . Depression with anxiety 07/17/2013  . Hx of hepatitis C 07/17/2013  . Vitamin D deficiency 07/17/2013  .  Right lumbar radiculopathy 07/17/2013  . History of alcohol dependence ([place]) 07/17/2013  . H/O: rheumatic fever 06/30/2012  . Insomnia 06/30/2012    Jeral Pinch PT  04/19/2018, 12:53 PM  [facility] [place], [place], [address] Phone: [phone]   Fax:  [phone]  Name: Mary Stark MRN: [id number] Date of Birth: [birth year]

## 2018-04-21 ENCOUNTER — Encounter: Payer: Self-pay | Admitting: Physician Assistant

## 2018-04-21 ENCOUNTER — Ambulatory Visit: Payer: Self-pay | Admitting: Physician Assistant

## 2018-04-21 ENCOUNTER — Ambulatory Visit (INDEPENDENT_AMBULATORY_CARE_PROVIDER_SITE_OTHER): Payer: No Typology Code available for payment source | Admitting: Physician Assistant

## 2018-04-21 VITALS — BP 125/81 | HR 81 | Temp 98.2°F | Wt 186.0 lb

## 2018-04-21 DIAGNOSIS — J209 Acute bronchitis, unspecified: Secondary | ICD-10-CM | POA: Diagnosis not present

## 2018-04-21 MED ORDER — PREDNISONE 50 MG PO TABS: 50.0000 mg | ORAL_TABLET | Freq: Every day | ORAL | 0 refills | Status: DC

## 2018-04-21 MED FILL — predniSONE 50 MG TABS: 50 | 5 days supply | Qty: 5 | Fill #0

## 2018-04-21 NOTE — Progress Notes (Signed)
HPI:                                                                Mary Stark is a 62 y.o. female who presents to [facility] today for cough   Cough  This is a new problem. The current episode started in the past 7 days (x4 days). The problem has been gradually improving. The problem occurs every few minutes. The cough is non-productive. Associated symptoms include nasal congestion and shortness of breath. Pertinent negatives include no chest pain, fever, rash, sore throat or wheezing. Nothing aggravates the symptoms. She has tried prescription cough suppressant and rest (Mucinex) for the symptoms. Her past medical history is significant for bronchitis.     Past Medical History:  Diagnosis Date  . Alcohol dependence ([place])   . Depression 07/17/2013  . History of hepatitis C 07/17/2013  . History of rheumatic fever   . Hyperlipidemia 07/17/2013  . Hypertension   . Tobacco use   . Toe fracture, right 10/07/2016  . Vitamin D deficiency 07/17/2013   Past Surgical History:  Procedure Laterality Date  . CESAREAN SECTION  1984  . FINGER SURGERY Right    5th  . GYNECOLOGIC CRYOSURGERY    . TONSILLECTOMY     Social History   Tobacco Use  . Smoking status: Current Some Day Smoker    Packs/day: 1.00    Years: 25.00    Pack years: 25.00    Types: Cigarettes    Last attempt to quit: 12/17/2012    Years since quitting: 5.3  . Smokeless tobacco: Never Used  . Tobacco comment: <1 cig per month  Substance Use Topics  . Alcohol use: Yes    Alcohol/week: 14.4 - 21.6 oz    Types: 24 - 36 Cans of beer per week   family history includes Cardiomyopathy in her father; Liver cancer in her brother; Ovarian cancer in her mother; Protein C deficiency in her brother and brother; Stroke in her brother; Valvular heart disease in her father.    ROS: negative except as noted in the HPI  Medications: Current Outpatient Medications  Medication Sig  Dispense Refill  . albuterol (PROVENTIL HFA;VENTOLIN HFA) 108 (90 Base) MCG/ACT inhaler Inhale 1-2 puffs into the lungs every 4 (four) hours as needed for wheezing or shortness of breath. 1 Inhaler 1  . ARIPiprazole (ABILIFY) 10 MG tablet Take 1 tablet (10 mg total) by mouth at bedtime. 90 tablet 1  . Ascorbic Acid (VITAMIN C PO) Take 1 tablet by mouth daily.    Marland Kitchen aspirin EC 81 MG tablet Take 1 tablet (81 mg total) by mouth daily. 90 tablet 3  . buPROPion (WELLBUTRIN XL) 300 MG 24 hr tablet Take 1 tablet (300 mg total) by mouth daily. 90 tablet 1  . CALCIUM PO Take 1 tablet by mouth daily.    . Cyanocobalamin (VITAMIN B 12 PO) Take 1 tablet by mouth daily.    . cyclobenzaprine (FLEXERIL) 5 MG tablet Take 1-2 tablets (5-10 mg total) by mouth at bedtime. 30 tablet 1  . Estradiol 10 MCG TABS vaginal tablet Place vaginally.    Marland Kitchen GLUCOSAMINE-CHONDROITIN PO Take 1 tablet by mouth daily.    Marland Kitchen lisinopril (PRINIVIL,ZESTRIL) 20 MG  tablet Take 1 tablet (20 mg total) by mouth daily. 90 tablet 1  . Magnesium Glycinate POWD 400 mg by Does not apply route daily.  0  . metoprolol succinate (TOPROL-XL) 50 MG 24 hr tablet Take 1 tablet (50 mg total) by mouth daily. 90 tablet 1  . Omega-3 Fatty Acids (FISH OIL PO) Take 1 tablet by mouth daily.    Marland Kitchen omeprazole (PRILOSEC) 20 MG capsule Take 1 capsule (20 mg total) by mouth daily. 90 capsule 1  . ondansetron (ZOFRAN) 4 MG tablet Take 1 tablet (4 mg total) by mouth every 6 (six) hours as needed for nausea. 20 tablet 0  . oxyCODONE (OXY IR/ROXICODONE) 5 MG immediate release tablet Take 1 tablet (5 mg total) by mouth every 4 (four) hours as needed for moderate pain. 14 tablet 0  . simvastatin (ZOCOR) 20 MG tablet Take 1 tablet (20 mg total) by mouth at bedtime. TAKE 1 TABLET BY MOUTH DAILY 90 tablet 2  . VITAMIN E PO Take 1 tablet by mouth daily.     No current facility-administered medications for this visit.    Allergies  Allergen Reactions  . Metformin And  Related Other (See Comments)    Lactic acidosis   . Penicillins Hives and Rash    Has patient had a PCN reaction causing immediate rash, facial/tongue/throat swelling, SOB or lightheadedness with hypotension: Yes Has patient had a PCN reaction causing severe rash involving mucus membranes or skin necrosis:No Has patient had a PCN reaction that required hospitalization: Yes Has patient had a PCN reaction occurring within the last 10 years: No If all of the above answers are "NO", then may proceed with Cephalosporin use.        Objective:  BP 125/81   Pulse 81   Temp 98.2 F (36.8 C) (Oral)   Wt 186 lb (84.4 kg)   SpO2 100%   BMI 30.02 kg/m  Gen:  alert, not ill-appearing, no distress, appropriate for age 52: head normocephalic without obvious abnormality, conjunctiva and cornea clear, wearing glasses, TM's pearly gray and semi-transparent, oropharynx without erythema or edema, there is left maxillary sinus tenderness, neck supple, no cervical adenopathy, trachea midline Pulm: Normal work of breathing, normal phonation, clear to auscultation bilaterally, no wheezes, rales or rhonchi CV: Normal rate, regular rhythm, s1 and s2 distinct, no murmurs, clicks or rubs  Neuro: alert and oriented x 3, no tremor MSK: extremities atraumatic, normal gait and station Skin: intact, no rashes on exposed skin, no jaundice, no cyanosis  No results found for this or any previous visit (from the past 72 hour(s)). No results found.    Assessment and Plan: 62 y.o. female with   Acute bronchitis, unspecified organism - Plan: predniSONE (DELTASONE) 50 MG tablet - SpO2 100% on RA at rest, no adventitious lung sounds, no tachypnea or tachycardia. Afebrile. Supportive care with steroid burst and bronchodilator Continue Mucinex Cough suppresant at bedtime She is at increased risk of pneumonia due to recent hospitalization. Close follow-up in 1 week    Patient education and anticipatory guidance  given Patient agrees with treatment plan Follow-up in 1 week or sooner as needed if symptoms worsen or fail to improve  Darlyne Russian PA-C

## 2018-04-21 NOTE — Patient Instructions (Addendum)
For cough:  - prednisone with breakfast for 5 days - Albuterol every 4-6 hours as needed for chest tightness, shortness of breath or wheezing and at bedtime - plain Mucinex - cough suppressant at bedtime  For nasal congestion/sinus pressure: - nasal saline rinses / netti pot - intranasal steroid like Flonase or Nasonex may be beneficial - Ayr nasal gel or Aquafor or Vaseline for nosebleeds (apply to the nasal passages gentle with a Q-tip) - warm facial compresses - tylenol [phone] mg every 8 hours or Ibuprofen 400-600 mg every 6 hours as needed for pain/headaches - oral decongestants like Sudafed may help with nasal symptoms, but avoid if you have high blood pressure, heart disease or kidney disease    Acute Bronchitis, Adult Acute bronchitis is sudden (acute) swelling of the air tubes (bronchi) in the lungs. Acute bronchitis causes these tubes to fill with mucus, which can make it hard to breathe. It can also cause coughing or wheezing. In adults, acute bronchitis usually goes away within 2 weeks. A cough caused by bronchitis may last up to 3 weeks. Smoking, allergies, and asthma can make the condition worse. Repeated episodes of bronchitis may cause further lung problems, such as chronic obstructive pulmonary disease (COPD). What are the causes? This condition can be caused by germs and by substances that irritate the lungs, including:  Cold and flu viruses. This condition is most often caused by the same virus that causes a cold.  Bacteria.  Exposure to tobacco smoke, dust, fumes, and air pollution.  What increases the risk? This condition is more likely to develop in people who:  Have close contact with someone with acute bronchitis.  Are exposed to lung irritants, such as tobacco smoke, dust, fumes, and vapors.  Have a weak immune system.  Have a respiratory condition such as asthma.  What are the signs or symptoms? Symptoms of this condition include:  A  cough.  Coughing up clear, yellow, or green mucus.  Wheezing.  Chest congestion.  Shortness of breath.  A fever.  Body aches.  Chills.  A sore throat.  How is this diagnosed? This condition is usually diagnosed with a physical exam. During the exam, your health care provider may order tests, such as chest X-rays, to rule out other conditions. He or she may also:  Test a sample of your mucus for bacterial infection.  Check the level of oxygen in your blood. This is done to check for pneumonia.  Do a chest X-ray or lung function testing to rule out pneumonia and other conditions.  Perform blood tests.  Your health care provider will also ask about your symptoms and medical history. How is this treated? Most cases of acute bronchitis clear up over time without treatment. Your health care provider may recommend:  Drinking more fluids. Drinking more makes your mucus thinner, which may make it easier to breathe.  Taking a medicine for a fever or cough.  Taking an antibiotic medicine.  Using an inhaler to help improve shortness of breath and to control a cough.  Using a cool mist vaporizer or humidifier to make it easier to breathe.  Follow these instructions at home: Medicines  Take over-the-counter and prescription medicines only as told by your health care provider.  If you were prescribed an antibiotic, take it as told by your health care provider. Do not stop taking the antibiotic even if you start to feel better. General instructions  Get plenty of rest.  Drink enough fluids to keep  your urine clear or pale yellow.  Avoid smoking and secondhand smoke. Exposure to cigarette smoke or irritating chemicals will make bronchitis worse. If you smoke and you need help quitting, ask your health care provider. Quitting smoking will help your lungs heal faster.  Use an inhaler, cool mist vaporizer, or humidifier as told by your health care provider.  Keep all follow-up  visits as told by your health care provider. This is important. How is this prevented? To lower your risk of getting this condition again:  Wash your hands often with soap and water. If soap and water are not available, use hand sanitizer.  Avoid contact with people who have cold symptoms.  Try not to touch your hands to your mouth, nose, or eyes.  Make sure to get the flu shot every year.  Contact a health care provider if:  Your symptoms do not improve in 2 weeks of treatment. Get help right away if:  You cough up blood.  You have chest pain.  You have severe shortness of breath.  You become dehydrated.  You faint or keep feeling like you are going to faint.  You keep vomiting.  You have a severe headache.  Your fever or chills gets worse. This information is not intended to replace advice given to you by your health care provider. Make sure you discuss any questions you have with your health care provider. Document Released: 01/07/2005 Document Revised: 06/24/2016 Document Reviewed: 05/20/2016 Elsevier Interactive Patient Education  [employer].

## 2018-04-26 ENCOUNTER — Other Ambulatory Visit: Payer: Self-pay | Admitting: Physician Assistant

## 2018-04-27 ENCOUNTER — Ambulatory Visit (INDEPENDENT_AMBULATORY_CARE_PROVIDER_SITE_OTHER): Payer: No Typology Code available for payment source | Admitting: Family Medicine

## 2018-04-27 ENCOUNTER — Ambulatory Visit (INDEPENDENT_AMBULATORY_CARE_PROVIDER_SITE_OTHER): Payer: No Typology Code available for payment source | Admitting: Physician Assistant

## 2018-04-27 ENCOUNTER — Encounter: Payer: Self-pay | Admitting: Family Medicine

## 2018-04-27 ENCOUNTER — Ambulatory Visit: Payer: No Typology Code available for payment source | Admitting: Physical Therapy

## 2018-04-27 ENCOUNTER — Ambulatory Visit (INDEPENDENT_AMBULATORY_CARE_PROVIDER_SITE_OTHER): Payer: No Typology Code available for payment source

## 2018-04-27 ENCOUNTER — Encounter: Payer: Self-pay | Admitting: Physician Assistant

## 2018-04-27 VITALS — BP 160/103 | HR 71 | Wt 190.0 lb

## 2018-04-27 DIAGNOSIS — M25511 Pain in right shoulder: Secondary | ICD-10-CM | POA: Diagnosis not present

## 2018-04-27 DIAGNOSIS — R635 Abnormal weight gain: Secondary | ICD-10-CM

## 2018-04-27 DIAGNOSIS — M6281 Muscle weakness (generalized): Secondary | ICD-10-CM | POA: Diagnosis not present

## 2018-04-27 DIAGNOSIS — E6609 Other obesity due to excess calories: Secondary | ICD-10-CM | POA: Diagnosis not present

## 2018-04-27 DIAGNOSIS — R29898 Other symptoms and signs involving the musculoskeletal system: Secondary | ICD-10-CM

## 2018-04-27 LAB — CBC/DIFF AMBIGUOUS DEFAULT
BASOS ABS: 0 10*3/uL (ref 0.0–0.2)
BASOS: 0 %
EOS (ABSOLUTE): 0 10*3/uL (ref 0.0–0.4)
Eos: 0 %
Hematocrit: 39.1 % (ref 34.0–46.6)
Hemoglobin: 12.4 g/dL (ref 11.1–15.9)
Immature Grans (Abs): 0.1 10*3/uL (ref 0.0–0.1)
Immature Granulocytes: 1 %
Lymphocytes Absolute: 2.6 10*3/uL (ref 0.7–3.1)
Lymphs: 24 %
MCH: 30.6 pg (ref 26.6–33.0)
MCHC: 31.7 g/dL (ref 31.5–35.7)
MCV: 97 fL (ref 79–97)
Monocytes Absolute: 1.1 10*3/uL — ABNORMAL HIGH (ref 0.1–0.9)
Monocytes: 10 %
NEUTROS ABS: 7 10*3/uL (ref 1.4–7.0)
Neutrophils: 65 %
PLATELETS: 327 10*3/uL (ref 150–379)
RBC: 4.05 x10E6/uL (ref 3.77–5.28)
RDW: 13.7 % (ref 12.3–15.4)
WBC: 10.8 10*3/uL (ref 3.4–10.8)

## 2018-04-27 LAB — BASIC METABOLIC PANEL
BUN / CREAT RATIO: 29 — AB (ref 12–28)
BUN: 22 mg/dL (ref 8–27)
CHLORIDE: 103 mmol/L (ref 96–106)
CO2: 23 mmol/L (ref 20–29)
Calcium: 9.4 mg/dL (ref 8.7–10.3)
Creatinine, Ser: 0.77 mg/dL (ref 0.57–1.00)
GFR calc non Af Amer: 84 mL/min/{1.73_m2} (ref >59)
GFR, EST AFRICAN AMERICAN: 97 mL/min/{1.73_m2} (ref >59)
GLUCOSE: 88 mg/dL (ref 65–99)
POTASSIUM: 4.3 mmol/L (ref 3.5–5.2)
SODIUM: 142 mmol/L (ref 134–144)

## 2018-04-27 LAB — MAGNESIUM: Magnesium: 2 mg/dL (ref 1.6–2.3)

## 2018-04-27 LAB — IRON AND TIBC
IRON SATURATION: 29 % (ref 15–55)
Iron: 98 ug/dL (ref 27–159)
Total Iron Binding Capacity: 337 ug/dL (ref 250–450)
UIBC: 239 ug/dL (ref 131–425)

## 2018-04-27 LAB — SPECIMEN STATUS REPORT

## 2018-04-27 LAB — FERRITIN: FERRITIN: 158 ng/mL — AB (ref 15–150)

## 2018-04-27 MED ORDER — LORCASERIN HCL ER 20 MG PO TB24: 20.0000 mg | ORAL_TABLET | Freq: Every day | ORAL | 2 refills | Status: DC

## 2018-04-27 MED FILL — SIMVASTATIN 20 MG TABLET: 20 | 90 days supply | Qty: 90 | Fill #1

## 2018-04-27 MED FILL — ASPIRIN ADULT LOW STRENGTH: 81 | 90 days supply | Qty: 90 | Fill #1

## 2018-04-27 NOTE — Patient Instructions (Addendum)
1. I will go to the gym 1 day per week for at least 40 minutes 2. I will limit beer to 1, 6-pack per week 3. I will prepare my own meals at home and won't eat the grandkids' food :) 4. I will measure my calories with MyFitnessPal. 1400-1600 calorie, moderate protein  Frozen meal options - Healthy Choice Power Bowl - Evol brand - Amy's Light and Lean   Lorcaserin extended-release tablets What is this medicine? LORCASERIN (lor ca SER in) is used to promote and maintain weight loss in obese patients. This medicine should be used with a reduced calorie diet and, if appropriate, an exercise program. This medicine may be used for other purposes; ask your health care provider or pharmacist if you have questions. COMMON BRAND NAME(S): Belviq XR What should I tell my health care provider before I take this medicine? They need to know if you have any of these conditions: -anatomical deformation of the penis, Peyronie's disease, or history of priapism (painful and prolonged erection) -diabetes -heart disease -history of blood diseases, like sickle cell anemia or leukemia -history of irregular heartbeat -kidney disease -liver disease -suicidal thoughts, plans, or attempt; a previous suicide attempt by you or a family member -an unusual or allergic reaction to lorcaserin, other medicines, foods, dyes, or preservatives -pregnant or trying to get pregnant -breast-feeding How should I use this medicine? Take this medicine by mouth with a glass of water. Follow the directions on the prescription label. Do not cut, crush or chew this medicine. You can take it with or without food. Take your medicine at regular intervals. Do not take it more often than directed. Do not stop taking except on your doctor's advice. Talk to your pediatrician regarding the use of this medicine in children. Special care may be needed. Overdosage: If you think you have taken too much of this medicine contact a poison control  center or emergency room at once. NOTE: This medicine is only for you. Do not share this medicine with others. What if I miss a dose? If you miss a dose, take it as soon as you can. If it is almost time for your next dose, take only that dose. Do not take double or extra doses. What may interact with this medicine? -cabergoline -certain medicines for depression, anxiety, or psychotic disturbances -certain medicines for erectile dysfunction -certain medicines for migraine headache like almotriptan, eletriptan, frovatriptan, naratriptan, rizatriptan, sumatriptan, zolmitriptan -dextromethorphan -linezolid -lithium -medicines for diabetes -other weight loss products -tramadol -St. John's Wort -stimulant medicines for attention disorders, weight loss, or to stay awake -tryptophan This list may not describe all possible interactions. Give your health care provider a list of all the medicines, herbs, non-prescription drugs, or dietary supplements you use. Also tell them if you smoke, drink alcohol, or use illegal drugs. Some items may interact with your medicine. What should I watch for while using this medicine? This medicine is intended to be used in addition to a healthy diet and appropriate exercise. The best results are achieved this way. Your doctor should instruct you to stop using this medicine if you do not lose a certain amount of weight within the first 12 weeks of treatment, but it is important that you do not change your dose in any way without consulting your doctor or health care professional. Visit your doctor or health care professional for regular checkups. Your doctor may order blood tests or other tests to see how you are doing. Do not drive, use  machinery, or do anything that needs mental alertness until you know how this medicine affects you. This medicine may affect blood sugar levels. If you have diabetes, check with your doctor or health care professional before you change  your diet or the dose of your diabetic medicine. Patients and their families should watch out for worsening depression or thoughts of suicide. Also watch out for sudden changes in feelings such as feeling anxious, agitated, panicky, irritable, hostile, aggressive, impulsive, severely restless, overly excited and hyperactive, or not being able to sleep. If this happens, especially at the beginning of treatment or after a change in dose, call your health care professional. Contact your doctor or health care professional right away if you are a man with an erection that lasts longer than 4 hours or if the erection becomes painful. This may be a sign of serious problem and must be treated right away to prevent permanent damage. What side effects may I notice from receiving this medicine? Side effects that you should report to your doctor or health care professional as soon as possible: -allergic reactions like skin rash, itching or hives, swelling of the face, lips, or tongue -abnormal production of milk -breast enlargement in both males and females -breathing problems -changes in emotions or moods -changes in vision -confusion -erection lasting more than 4 hours or a painful erection -fast or irregular heart beat -feeling faint or lightheaded, falls -fever or chills, sore throat -hallucination, loss of contact with reality -high or low blood pressure -menstrual changes -restlessness -signs and symptoms of low blood sugar such as feeling anxious; confusion; dizziness; increased hunger; unusually weak or tired; sweating; shakiness; cold; irritable; headache; blurred vision; fast heartbeat; loss of consciousness -slow or irregular heartbeat -stiff muscles -sweating -suicidal thoughts or actions -swelling of the ankles, feet, hands -unusually weak or tired -vomiting Side effects that usually do not require medical attention (report to your doctor or health care professional if they continue or  are bothersome): -back pain -constipation -cough -dry mouth -nausea -tiredness This list may not describe all possible side effects. Call your doctor for medical advice about side effects. You may report side effects to FDA at 1-800-FDA-1088. Where should I keep my medicine? Keep out of the reach of children. This medicine can be abused. Keep your medicine in a safe place to protect it from theft. Do not share this medicine with anyone. Selling or giving away this medicine is dangerous and against the law. Store at room temperature between 15 and 30 degrees C (59 and 86 degrees F). Throw away any unused medicine after the expiration date. NOTE: This sheet is a summary. It may not cover all possible information. If you have questions about this medicine, talk to your doctor, pharmacist, or health care provider.  2018 Elsevier/Gold Standard (2016-01-01 16:24:54)

## 2018-04-27 NOTE — Progress Notes (Signed)
Hi Mary Stark,  Your labs look great. Anemia has resolved. Magnesium is better. Continue the magnesium supplement.   Keep me updated on your GI symptoms.   Best, Evlyn Clines

## 2018-04-27 NOTE — Progress Notes (Signed)
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  She has a history of 3 years of shoulder pain.  She had a fall about 3 years ago resulting in " hairline fracture of the shoulder".  She had typical conservative management and had continued pain.  She was seen by orthopedist who obtained an MRI which showed moderate supraspinatus tendinopathy but also concern for avascular necrosis at the humeral head.  She had improved symptoms and notes that she has had lingering mild shoulder pain for 3 years.  However over the last few months its been worsening.  She notes pain in her right lateral upper arm when she reached back and across her body.  She notes the pain is not particularly bothersome with overhead motion nor at bedtime.  She denies any neck pain or radiating pain weakness or numbness.  She denies any injury.  She notes that her PCP has evaluated for this and referred to physical therapy.  She is had one session which is helped a little.  Additionally she saw her PCP on May 9 and received a steroid burst for bronchitis.  She notes the steroids have improved her pain in her shoulder significantly.   She notes that as a child she had a waterskiing accident and suffered avulsion of her forearm musculature which results in some chronic swelling of her right forearm which is not painful and not limiting.  Past medical history, Surgical history, Family history not pertinant except as noted below, Social history, Allergies, and medications have been entered into the medical record, reviewed, and no changes needed.   Review of Systems: No headache, visual changes, nausea, vomiting, diarrhea, constipation, dizziness, abdominal pain, skin rash, fevers, chills, night sweats, weight loss, swollen lymph nodes, body aches, joint swelling, muscle aches, chest pain, shortness of breath,  mood changes, visual or auditory hallucinations.   Objective:    Vitals:   04/27/18 1355 04/27/18 1435  BP: (!) 181/83 (!) 160/103  Pulse: 78 71   General: Well Developed, well nourished, and in no acute distress.  Neuro/Psych: Alert and oriented x3, extra-ocular muscles intact, able to move all 4 extremities, sensation grossly intact. Skin: Warm and dry, no rashes noted.  Respiratory: Not using accessory muscles, speaking in full sentences, trachea midline.  Cardiovascular: Pulses palpable, no extremity edema. Abdomen: Does not appear distended. MSK:  Right shoulder normal-appearing with no swelling or ecchymosis or induration or erythema. Not particularly tender. Range of motion full abduction and external range of motion.  Internal range of motion limited to the lumbar spine compared to thoracic spine left shoulder. Strength intact throughout. Negative Hawkins and Neer's test. Mildly positive crossover arm compression test.  Right forearm: Swelling at the distal radius nontender.  X-ray right shoulder my personal independent review of images: No acute changes. No significant AVN collapse or flattening of the humeral head.  Awaiting formal radiology review  EXAM: MRI OF THE RIGHT SHOULDER WITHOUT CONTRAST  TECHNIQUE: Multiplanar, multisequence MR imaging of the shoulder was performed. No intravenous contrast was administered.  COMPARISON:  None.  FINDINGS: Rotator cuff: Moderate supraspinatus and mild infraspinatus tendinopathy.  Muscles:  Unremarkable  Biceps long head: Moderate tendinopathy of the intra-articular segment  Acromioclavicular Joint: Trace fluid in the subacromial subdeltoid bursa. The acromial undersurface is type 1 (flat).  Glenohumeral Joint: Moderate degenerative chondral thinning. Linear subcortical low  T1 signal along the upper articular margin of the humeral head on images 6 through 10 of series 7 reason across ability of a small  focus of avascular necrosis, or an unusual degenerative lesion.  Labrum:  Grossly unremarkable  Bones: No significant extra-articular osseous abnormalities identified.  IMPRESSION: 1. Moderate supraspinatus and biceps tendinopathy, with mild infraspinatus tendinopathy. No full-thickness rotator cuff tear noted. 2. Trace subacromial subdeltoid bursitis. 3. Moderate degenerative chondral thinning in the glenohumeral joint. 4. Suspicion for a small focus of chronic avascular necrosis along the upper articular margin of the humeral head.   Electronically Signed   By: Mary Stark M.D.   On: 05/23/2015 09:34 I personally (independently) visualized and performed the interpretation of the images attached in this note.   Impression and Recommendations:    Assessment and Plan: 62 y.o. female with Right shoulder pain: Exacerbation of ongoing chronic pain.  Etiology somewhat unclear including Mary Stark etiology or RTC tendonitis.  Mary Stark is improving with oral steroid burst and PT.  Plan to continue PT and follow along.  Doubtful for severe AVN based on xray today however formal read is pending. .  Recheck in a few weeks as needed.. If pain return next step will likely be diagnostic and therapeutic injection.    Blood Pressure is elevated. Plan for home log and recheck with PCP.  Orders Placed This Encounter  Procedures  . DG Shoulder Right    Standing Status:   Future    Number of Occurrences:   1    Standing Expiration Date:   06/28/2019    Order Specific Question:   Reason for Exam (SYMPTOM  OR DIAGNOSIS REQUIRED)    Answer:   eval shoulder pain. F/u concern AVN MRI 3 years ago    Order Specific Question:   Is patient pregnant?    Answer:   No    Order Specific Question:   Preferred imaging location?    Answer:   [facility]    Order Specific Question:   Radiology Contrast Protocol - do NOT remove file path    Answer:    [email or web address]   No orders of the defined types were placed in this encounter.   Discussed warning signs or symptoms. Please see discharge instructions. Patient expresses understanding.

## 2018-04-27 NOTE — Therapy (Addendum)
[facility] [place] [employer] [place], [place], [address] Phone: [phone]   Fax:  [phone]  Physical Therapy Treatment  Patient Details  Name: Mary Stark MRN: [id number] Date of Birth: 04/14/1956 Referring Provider: Nelson Chimes, PA-C   Encounter Date: 04/27/2018  PT End of Session - 04/27/18 1602    Visit Number  2    Number of Visits  4    Date for PT Re-Evaluation  05/17/18    PT Start Time  1517    PT Stop Time  1602    PT Time Calculation (min)  45 min    Activity Tolerance  Patient tolerated treatment well;No increased pain    Behavior During Therapy  WFL for tasks assessed/performed       Past Medical History:  Diagnosis Date  . Alcohol dependence ([place])   . Depression 07/17/2013  . History of hepatitis C 07/17/2013  . History of rheumatic fever   . Hyperlipidemia 07/17/2013  . Hypertension   . Tobacco use   . Toe fracture, right 10/07/2016  . Vitamin D deficiency 07/17/2013    Past Surgical History:  Procedure Laterality Date  . CESAREAN SECTION  1984  . FINGER SURGERY Right    5th  . GYNECOLOGIC CRYOSURGERY    . TONSILLECTOMY      There were no vitals filed for this visit.  Subjective Assessment - 04/27/18 1519    Subjective  Pt reports no new changes since last visit. She just finished up Prednisone for bronchitis.  The medicine had helped Rt shoulder feel good.  she has been performing HEP as she can.     Patient Stated Goals  relearn her exercise that she used to do because it helped.     Currently in Pain?  Yes    Pain Score  1     Pain Location  Shoulder    Pain Orientation  Right    Aggravating Factors   reaching to buckle seat belt; drying back; fixing hair    Pain Relieving Factors  medication, heating pad.          [facility] PT Assessment - 04/27/18 0001      Assessment   Medical Diagnosis  Rt shoulder pain    Referring Provider  Nelson Chimes, PA-C    Onset Date/Surgical  Date  03/26/18    Hand Dominance  Right    Next MD Visit  PRN      Strength   Right Shoulder Internal Rotation  -- 5-/5    Right Shoulder External Rotation  4+/5        [facility] Adult PT Treatment/Exercise - 04/27/18 0001      Shoulder Exercises: Supine   Other Supine Exercises  Sash RUE with green band x 10;  overhead pull with red band x 10; bilat horz abdct x 10 red band       Shoulder Exercises: Standing   External Rotation  Strengthening;Right;Theraband;10 reps 2nd set with BUE    Internal Rotation  Strengthening;Right;15 reps;Theraband    Theraband Level (Shoulder Internal Rotation)  Level 2 (Red)    Flexion  Strengthening;Both;10 reps;Theraband overhead pull, mirror for feedback.     Theraband Level (Shoulder Flexion)  Level 2 (Red)    Row  Both;12 reps;Theraband pause 3 sec in scap retraction    Theraband Level (Shoulder Row)  Level 3 (Green)    Other Standing Exercises  D2 flexion with red band x 10 each arm;  D1  ext with red band RUE x 10 (simulate seatbelt).        Shoulder Exercises: Stretch   Internal Rotation Stretch  30 seconds strap over shoulder assist x 2 reps    External Rotation Stretch  --    Other Shoulder Stretches  3 position doorway stretch x 30 sec, 2 reps each position.       Modalities   Modalities  -- pt declined.              PT Education - 04/27/18 1632    Education provided  Yes    Education Details  HEP, issued green band for rowing    Person(s) Educated  Patient    Methods  Explanation;Handout;Demonstration;Verbal cues    Comprehension  Verbalized understanding;Returned demonstration          PT Long Term Goals - 04/27/18 1546      PT LONG TERM GOAL #1   Title  I with HEP ( 05/17/18)     Time  4    Period  Weeks    Status  On-going      PT LONG TERM GOAL #2   Title  demo Rt shoulder rotation WNL to allow her to hook her bra/donn seatbelt without difficulty  (05/17/18)    Time  4    Period  Weeks    Status  On-going      PT  LONG TERM GOAL #3   Title  report ability to perform bathing/dressing without pain in the Rt shoulder ( 05/17/18)     Time  4    Period  Weeks    Status  On-going still painful and fatigues quickly,       PT LONG TERM GOAL #4   Title  demo Rt shoulder rotation strength =/> 5-/5 without pain ( 05/17/18)      Time  4    Period  Weeks    Status  Partially Met improved ER.       PT LONG TERM GOAL #5   Title  improve FOTO =/< 33% limited ( 05/17/18)     Time  4    Period  Weeks    Status  On-going            Plan - 04/27/18 1621    Clinical Impression Statement  Pt demonstrated improved Rt shoulder rotation strength.  She continues to have difficulty with clicking seat belt and reaching arm behind back, as well as continued fatigue and pain in Rt shoulder with fixing hair.   Modified and added exercises in HEP to address concerns.  No goals met yet; only 2nd visit.     Rehab Potential  Excellent    PT Frequency  1x / week    PT Duration  4 weeks    PT Treatment/Interventions  Iontophoresis 67m/ml Dexamethasone;Dry needling;Manual techniques;Moist Heat;Ultrasound;Patient/family education;Taping;Vasopneumatic Device;Therapeutic exercise;Cryotherapy;Electrical Stimulation;Passive range of motion    PT Next Visit Plan  assess response to HEP; progress as tolerated.     Consulted and Agree with Plan of Care  Patient       Patient will benefit from skilled therapeutic intervention in order to improve the following deficits and impairments:  Pain, Improper body mechanics, Postural dysfunction, Impaired UE functional use, Decreased strength, Decreased range of motion, Increased edema  Visit Diagnosis: Acute pain of right shoulder  Muscle weakness (generalized)  Other symptoms and signs involving the musculoskeletal system     Problem List Patient Active Problem List  Diagnosis Date Noted  . 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/2018  . Anemia due to blood loss, acute 04/14/2018  . Hypomagnesemia  04/14/2018  . Hypocalcemia 04/14/2018  . Tobacco abuse 04/08/2018  . Depression 04/07/2018  . AKI (acute kidney injury) ([place]) 04/07/2018  . Sepsis ([place]) 04/07/2018  . Acute colitis 04/07/2018  . Avascular necrosis of humeral head, right ([place]) 03/30/2018  . NSAID long-term use 03/30/2018  . Encounter for weight loss counseling 03/30/2018  . Seborrheic keratoses 03/15/2018  . History of nonmelanoma skin cancer 03/02/2018  . Skin lesion of chest wall 03/02/2018  . Alcohol dependence with unspecified alcohol-induced disorder ([place]) 02/11/2018  . Severe episode of recurrent major depressive disorder, without psychotic features ([place]) 02/11/2018  . Transaminitis 01/06/2018  . Nicotine dependence 01/06/2018  . Heavy alcohol consumption 01/06/2018  . Encounter for monitoring statin therapy 01/06/2018  . Class 1 obesity due to excess calories with serious comorbidity in adult 01/06/2018  . Chronic pain syndrome 04/21/2017  . Chronic right shoulder pain 07/03/2015  . Vaginismus 04/12/2014  . Essential hypertension with goal blood pressure less than 140/90 01/17/2014  . Menopausal state 11/08/2013  . Family history of ovarian cancer 09/27/2013  . 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 Personal history of colonic polyps 07/18/2013  . Dyslipidemia, goal LDL below 100 07/17/2013  . Depression with anxiety 07/17/2013  . Hx of hepatitis C 07/17/2013  . Vitamin D deficiency 07/17/2013  . Right lumbar radiculopathy 07/17/2013  . History of alcohol dependence ([place]) 07/17/2013  . H/O: rheumatic fever 06/30/2012  . Insomnia 06/30/2012   Kerin Perna, PTA 04/27/18 4:35 PM  [facility] [place], [place], [address] Phone: [phone]   Fax:  [phone]  Name: Mary Stark MRN: [id number] Date of Birth: 01/30/1956  PHYSICAL THERAPY DISCHARGE SUMMARY  Visits from Start of Care: 2  Current functional  level related to goals / functional outcomes: unknown   Remaining deficits: Unknown, patient had made progress between eval and one treatment.    Education / Equipment: HEP Plan: Patient agrees to discharge.  Patient goals were not met. Patient is being discharged due to the patient's request.  ?????   Jeral Pinch, PT 05/19/18 11:30 AM

## 2018-04-27 NOTE — Progress Notes (Signed)
HPI:                                                                Mary Stark is a 62 y.o. female who presents to [facility] today for medical weight management  Current weight: 190lb Wt Readings from Last 3 Encounters:  04/27/18 190 lb (86.2 kg)  04/27/18 190 lb (86.2 kg)  04/21/18 186 lb (84.4 kg)  Has had some recent health issues, including acute colitis, which made weight loss secondary. She was started on Metformin (Abilify-associated weight gain), which unfortunately caused lactic acidosis at a low dose. Recently completed oral steroids, which increased appetite and caused her to re-gain the weight she had lost (approx 4 pounds). She is clinically improved and ready to try another medication to assist with weight loss.    Past Medical History:  Diagnosis Date  . Alcohol dependence ([place])   . Depression 07/17/2013  . History of hepatitis C 07/17/2013  . History of rheumatic fever   . Hyperlipidemia 07/17/2013  . Hypertension   . Tobacco use   . Toe fracture, right 10/07/2016  . Vitamin D deficiency 07/17/2013   Past Surgical History:  Procedure Laterality Date  . CESAREAN SECTION  1984  . FINGER SURGERY Right    5th  . GYNECOLOGIC CRYOSURGERY    . TONSILLECTOMY     Social History   Tobacco Use  . Smoking status: Current Some Day Smoker    Packs/day: 1.00    Years: 25.00    Pack years: 25.00    Types: Cigarettes    Last attempt to quit: 12/17/2012    Years since quitting: 5.3  . Smokeless tobacco: Never Used  . Tobacco comment: <1 cig per month  Substance Use Topics  . Alcohol use: Yes    Alcohol/week: 14.4 - 21.6 oz    Types: 24 - 36 Cans of beer per week   family history includes Cardiomyopathy in her father; Liver cancer in her brother; Ovarian cancer in her mother; Protein C deficiency in her brother and brother; Stroke in her brother; Valvular heart disease in her father.    ROS: negative except as noted in  the HPI  Medications: Current Outpatient Medications  Medication Sig Dispense Refill  . albuterol (PROVENTIL HFA;VENTOLIN HFA) 108 (90 Base) MCG/ACT inhaler Inhale 1-2 puffs into the lungs every 4 (four) hours as needed for wheezing or shortness of breath. 1 Inhaler 1  . ARIPiprazole (ABILIFY) 10 MG tablet Take 1 tablet (10 mg total) by mouth at bedtime. 90 tablet 1  . Ascorbic Acid (VITAMIN C PO) Take 1 tablet by mouth daily.    Marland Kitchen aspirin EC 81 MG tablet Take 1 tablet (81 mg total) by mouth daily. 90 tablet 3  . buPROPion (WELLBUTRIN XL) 300 MG 24 hr tablet Take 1 tablet (300 mg total) by mouth daily. 90 tablet 1  . CALCIUM PO Take 1 tablet by mouth daily.    . Cyanocobalamin (VITAMIN B 12 PO) Take 1 tablet by mouth daily.    . cyclobenzaprine (FLEXERIL) 5 MG tablet Take 1-2 tablets (5-10 mg total) by mouth at bedtime. 30 tablet 1  . Estradiol 10 MCG TABS vaginal tablet Place vaginally.    Marland Kitchen GLUCOSAMINE-CHONDROITIN  PO Take 1 tablet by mouth daily.    Marland Kitchen lisinopril (PRINIVIL,ZESTRIL) 20 MG tablet Take 1 tablet (20 mg total) by mouth daily. 90 tablet 1  . Magnesium Glycinate POWD 400 mg by Does not apply route daily.  0  . metoprolol succinate (TOPROL-XL) 50 MG 24 hr tablet Take 1 tablet (50 mg total) by mouth daily. 90 tablet 1  . Omega-3 Fatty Acids (FISH OIL PO) Take 1 tablet by mouth daily.    Marland Kitchen omeprazole (PRILOSEC) 20 MG capsule Take 1 capsule (20 mg total) by mouth daily. 90 capsule 1  . ondansetron (ZOFRAN) 4 MG tablet Take 1 tablet (4 mg total) by mouth every 6 (six) hours as needed for nausea. 20 tablet 0  . oxyCODONE (OXY IR/ROXICODONE) 5 MG immediate release tablet Take 1 tablet (5 mg total) by mouth every 4 (four) hours as needed for moderate pain. 14 tablet 0  . predniSONE (DELTASONE) 50 MG tablet Take 1 tablet (50 mg total) by mouth daily. 5 tablet 0  . simvastatin (ZOCOR) 20 MG tablet Take 1 tablet (20 mg total) by mouth at bedtime. TAKE 1 TABLET BY MOUTH DAILY 90 tablet 2  .  VITAMIN E PO Take 1 tablet by mouth daily.     No current facility-administered medications for this visit.    Allergies  Allergen Reactions  . Metformin And Related Other (See Comments)    Lactic acidosis   . Penicillins Hives and Rash    Has patient had a PCN reaction causing immediate rash, facial/tongue/throat swelling, SOB or lightheadedness with hypotension: Yes Has patient had a PCN reaction causing severe rash involving mucus membranes or skin necrosis:No Has patient had a PCN reaction that required hospitalization: Yes Has patient had a PCN reaction occurring within the last 10 years: No If all of the above answers are "NO", then may proceed with Cephalosporin use.      Objective:  BP (!) 160/103   Pulse 71   Wt 190 lb (86.2 kg)   BMI 30.67 kg/m  Gen:  alert, not ill-appearing, no distress, appropriate for age, obese female HEENT: head normocephalic without obvious abnormality, conjunctiva and cornea clear, wearing glasses, trachea midline Pulm: Normal work of breathing, normal phonation, clear to auscultation bilaterally, no wheezes, rales or rhonchi CV: Normal rate, regular rhythm, s1 and s2 distinct, no murmurs, clicks or rubs  Neuro: alert and oriented x 3, no tremor MSK: extremities atraumatic, normal gait and station Skin: intact, no rashes on exposed skin, no jaundice, no cyanosis Psych: well-groomed, cooperative, good eye contact, euthymic mood, affect mood-congruent, speech is articulate, and thought processes clear and goal-directed    Results for orders placed or performed in visit on 04/26/18 (from the past 72 hour(s))  CBC/Diff Ambiguous Default     Status: Abnormal   Collection Time: 04/26/18  1:33 PM  Result Value Ref Range   WBC 10.8 3.4 - 10.8 x10E3/uL   RBC 4.05 3.77 - 5.28 x10E6/uL   Hemoglobin 12.4 11.1 - 15.9 g/dL   Hematocrit 39.1 34.0 - 46.6 %   MCV 97 79 - 97 fL   MCH 30.6 26.6 - 33.0 pg   MCHC 31.7 31.5 - 35.7 g/dL   RDW 13.7 12.3 - 15.4  %   Platelets 327 150 - 379 x10E3/uL   Neutrophils 65 Not Estab. %   Lymphs 24 Not Estab. %   Monocytes 10 Not Estab. %   Eos 0 Not Estab. %   Basos 0 Not Estab. %  Neutrophils Absolute 7.0 1.4 - 7.0 x10E3/uL   Lymphocytes Absolute 2.6 0.7 - 3.1 x10E3/uL   Monocytes Absolute 1.1 (H) 0.1 - 0.9 x10E3/uL   EOS (ABSOLUTE) 0.0 0.0 - 0.4 x10E3/uL   Basophils Absolute 0.0 0.0 - 0.2 x10E3/uL   Immature Granulocytes 1 Not Estab. %   Immature Grans (Abs) 0.1 0.0 - 0.1 x10E3/uL    Comment: A hand-written panel/profile was received from your office. In accordance with the [facility] Ambiguous Test Code Policy dated July 1610, we have assigned CBC with Differential/Platelet, Test Code #[id number] to this request. If this is not the testing you wished to receive on this specimen, please contact the [facility] Department to clarify the test order. We appreciate your business.   Basic metabolic panel     Status: Abnormal   Collection Time: 04/26/18  1:33 PM  Result Value Ref Range   Glucose 88 65 - 99 mg/dL   BUN 22 8 - 27 mg/dL   Creatinine, Ser 0.77 0.57 - 1.00 mg/dL   GFR calc non Af Amer 84 >59 mL/min/1.73   GFR calc Af Amer 97 >59 mL/min/1.73   BUN/Creatinine Ratio 29 (H) 12 - 28   Sodium 142 134 - 144 mmol/L   Potassium 4.3 3.5 - 5.2 mmol/L   Chloride 103 96 - 106 mmol/L   CO2 23 20 - 29 mmol/L   Calcium 9.4 8.7 - 10.3 mg/dL  Iron and TIBC     Status: None   Collection Time: 04/26/18  1:33 PM  Result Value Ref Range   Total Iron Binding Capacity 337 250 - 450 ug/dL   UIBC 239 131 - 425 ug/dL   Iron 98 27 - 159 ug/dL   Iron Saturation 29 15 - 55 %  Magnesium     Status: None   Collection Time: 04/26/18  1:33 PM  Result Value Ref Range   Magnesium 2.0 1.6 - 2.3 mg/dL  Ferritin     Status: Abnormal   Collection Time: 04/26/18  1:33 PM  Result Value Ref Range   Ferritin 158 (H) 15 - 150 ng/mL  Specimen status report     Status: None   Collection Time:  04/26/18  1:33 PM  Result Value Ref Range   specimen status report Comment     Comment: Isac Caddy BMP8 Default Ambig Abbrev BMP8 Default A hand-written panel/profile was received from your office. In accordance with the [facility] Ambiguous Test Code Policy dated July 9604, we have completed your order by using the closest currently or formerly recognized AMA panel.  We have assigned Basic Metabolic Panel (8), Test Code [phone] to this request. If this is not the testing you wished to receive on this specimen, please contact the [facility] Client Inquiry/Technical Services Department to clarify the test order.  We appreciate your business.    No results found.    Assessment and Plan: 62 y.o. female with   Class 1 obesity due to excess calories with serious comorbidity in adult, unspecified BMI - Plan: Lorcaserin HCl ER (BELVIQ XR) 20 MG TB24  Abnormal weight gain - Plan: Lorcaserin HCl ER (BELVIQ XR) 20 MG TB24  BP Readings from Last 3 Encounters:  04/27/18 (!) 160/103  04/27/18 (!) 160/103  04/21/18 125/81  - BP is typically in range on Lisinopril 20 mg and Metoprolol XL 50 mg. She has recently completed a steroid burst. Counseled to monitor BP closely at home. Therapeutic lifestyle changes, including weight loss - personally reviewed  recent hospital follow-up labs from 04/26/18, improved. Slight increase in ferritin, likely due to colitis. Magnesium is wnl. BMP wnl Avoiding Phentermine/Qsymia due to uncontrolled hypertension Avoiding Contrave and Wellbutrin since she is already on wellbutrin XL 300 mg Medication options include Belviq, Saxenda and Topamax. Patient opted for Belviq Counseled on weight loss through decreased caloric intake and increased aerobic exercise. 1500-1700 calorie moderat eprotein diet Patient instructions: 1. I will go to the gym 1 day per week for at least 40 minutes 2. I will limit beer to 1, 6-pack per week 3. I will prepare my own meals at home and  won't eat the grandkids' food :) 4. I will measure my calories with MyFitnessPal. 1400-1600 calorie, moderate protein  Frozen meal options - Healthy Choice Power Bowl - Evol brand - Amy's Light and Lean   Patient education and anticipatory guidance given Patient agrees with treatment plan Follow-up in 1 month for HTN and weight management or sooner as needed if symptoms worsen or fail to improve  Darlyne Russian PA-C

## 2018-04-27 NOTE — Patient Instructions (Addendum)
Internal Rotation (Passive)    Bring hand behind back, using other hand to assist. Hold _15-30___ seconds. Repeat _3__ times. Do _1___ sessions per day. Resistance: PNF D1 Extension    Opposite side toward anchor in shoulder width stance. Right palm back, pull down and away, rotating hand and arm. Palm starts and ends facing backward. Repeat 10___ times. Repeat with other arm for set. Rest _60__ seconds after set. Do _2__ sets per session.  http://plyo.exer.us/219   Copyright  VHI. All rights reserved.   (Home) PNF: D2 Flexion - Unilateral    Opposite side toward anchor, right arm down, across body, thumb down, pull arm up and out, rotating to thumb up. Follow hand with head and eyes. Repeat _10___ times per set. Do _2___ sets per session. Do _3-4___ sessions per week.  [place] position (lead with thumb);  CAN DO THIS STANDING OR ON BACK.   Resisted External Rotation: in Neutral - Bilateral  PALMS UP!!!  Keep elbows tucked at side.  Sit or stand, tubing in both hands, elbows at sides, bent to 90, forearms forward. Pinch shoulder blades together and rotate forearms out.  Repeat __10__ times per set. Do __2-3__ sets per session. Do __3-4__ sessions per week.  Resistive Band Rowing   With resistive band anchored in door, grasp both ends. Keeping elbows bent, pull back, squeezing shoulder blades together. Hold _3-5___ seconds. Repeat _10___ times. Do __2-3 sets ,   3-4 sessions per week.  GREEN BAND  Over Head Pull: Narrow Grip        On back, knees bent, feet flat, band across thighs, elbows straight but relaxed. Pull hands apart (start). Keeping elbows straight, bring arms up and over head, hands toward floor. Keep pull steady on band. Hold momentarily. Return slowly, keeping pull steady, back to start. Repeat _10__ times. Band color ___red___   Scapula Adduction With Pectorals, Low   Stand in doorframe with palms against frame and arms at 45. Lean forward and  squeeze shoulder blades. Hold _15-30__ seconds. Repeat _2__ times per session. Do _1-2__ sessions per day.  Scapula Adduction With Pectorals, Mid-Range   Stand in doorframe with palms against frame and arms at 90. Lean forward and squeeze shoulder blades. Hold _15-30__ seconds. Repeat __2_ times per session. Do _2-__ sessions per day.  \Scapula Adduction With Pectorals, High   Stand in doorframe with palms against frame and arms at 120. Lean forward and squeeze shoulder blades. Hold _30__ seconds. Repeat _2__ times per session. Do __2_ sessions per day.  CAN DO 1 arm at a time.       [facility] [place], [place] [address]  [phone] (office) [phone] (fax)

## 2018-04-27 NOTE — Patient Instructions (Signed)
Thank you for coming in today. Continue PT.  If not improving let me know.  Next step is injection likely in 4 -6 weeks or so.  MRI is 2nd step if not better at all.  Return sooner if needed.   If BP remains elevated complete BP log over the next few weeks and send to me or [place].

## 2018-04-29 ENCOUNTER — Telehealth: Payer: Self-pay | Admitting: Physician Assistant

## 2018-04-29 MED FILL — BELVIQ XR 20 MG TABLET: 20 | 30 days supply | Qty: 30 | Fill #0

## 2018-04-29 NOTE — Telephone Encounter (Signed)
Received fax from [place] that [place] was approved from 04/27/2018 through 07/27/2018.  Pharmacy notified and forms sent to scan.   Reference ID: [id number], CMA

## 2018-05-02 ENCOUNTER — Encounter: Payer: Self-pay | Admitting: Physician Assistant

## 2018-05-10 MED FILL — LISINOPRIL 20 MG TABLET: 20 | 90 days supply | Qty: 90 | Fill #1

## 2018-05-10 MED FILL — METOPROLOL SUCCINATE ER 50: 50 | 30 days supply | Qty: 30 | Fill #3

## 2018-05-12 ENCOUNTER — Encounter: Payer: Self-pay | Admitting: Physical Therapy

## 2018-05-24 ENCOUNTER — Ambulatory Visit: Payer: Self-pay

## 2018-06-07 ENCOUNTER — Ambulatory Visit (INDEPENDENT_AMBULATORY_CARE_PROVIDER_SITE_OTHER): Payer: No Typology Code available for payment source | Admitting: Physician Assistant

## 2018-06-07 ENCOUNTER — Encounter: Payer: Self-pay | Admitting: Physician Assistant

## 2018-06-07 VITALS — BP 129/85 | HR 69 | Temp 98.2°F | Resp 14

## 2018-06-07 DIAGNOSIS — J22 Unspecified acute lower respiratory infection: Secondary | ICD-10-CM

## 2018-06-07 MED ORDER — DOXYCYCLINE HYCLATE 100 MG PO TABS: 100.0000 mg | ORAL_TABLET | Freq: Two times a day (BID) | ORAL | 0 refills | Status: AC

## 2018-06-07 MED ORDER — BUDESONIDE-FORMOTEROL FUMARATE 80-4.5 MCG/ACT IN AERO: 2.0000 | INHALATION_SPRAY | Freq: Two times a day (BID) | RESPIRATORY_TRACT | 0 refills | Status: DC

## 2018-06-07 MED ORDER — HYDROCOD POLST-CPM POLST ER 10-8 MG/5ML PO SUER: 5.0000 mL | Freq: Two times a day (BID) | ORAL | 0 refills | Status: AC | PRN

## 2018-06-07 NOTE — Patient Instructions (Signed)

## 2018-06-07 NOTE — Progress Notes (Signed)
HPI:                                                                Mary Stark is a 62 y.o. female who presents to [facility] [place] today for cough  Cough  This is a new problem. The current episode started in the past 7 days (x 6 days). The problem has been gradually worsening. The problem occurs every few minutes. The cough is non-productive. Associated symptoms include shortness of breath and wheezing. Pertinent negatives include no chest pain, ear pain, fever, hemoptysis, myalgias, nasal congestion, postnasal drip, rhinorrhea or sore throat. Nothing aggravates the symptoms. Risk factors for lung disease include smoking/tobacco exposure. She has tried a beta-agonist inhaler and prescription cough suppressant for the symptoms. The treatment provided no relief. Her past medical history is significant for bronchitis.      Past Medical History:  Diagnosis Date  . Alcohol dependence ([place])   . Depression 07/17/2013  . History of hepatitis C 07/17/2013  . History of rheumatic fever   . Hyperlipidemia 07/17/2013  . Hypertension   . Tobacco use   . Toe fracture, right 10/07/2016  . Vitamin D deficiency 07/17/2013   Past Surgical History:  Procedure Laterality Date  . CESAREAN SECTION  1984  . FINGER SURGERY Right    5th  . GYNECOLOGIC CRYOSURGERY    . TONSILLECTOMY     Social History   Tobacco Use  . Smoking status: Current Some Day Smoker    Packs/day: 1.00    Years: 25.00    Pack years: 25.00    Types: Cigarettes    Last attempt to quit: 12/17/2012    Years since quitting: 5.4  . Smokeless tobacco: Never Used  . Tobacco comment: <1 cig per month  Substance Use Topics  . Alcohol use: Yes    Alcohol/week: 14.4 - 21.6 oz    Types: 24 - 36 Cans of beer per week   family history includes Cardiomyopathy in her father; Liver cancer in her brother; Ovarian cancer in her mother; Protein C deficiency in her brother and brother; Stroke in her  brother; Valvular heart disease in her father.    ROS: negative except as noted in the HPI  Medications: Current Outpatient Medications  Medication Sig Dispense Refill  . albuterol (PROVENTIL HFA;VENTOLIN HFA) 108 (90 Base) MCG/ACT inhaler Inhale 1-2 puffs into the lungs every 4 (four) hours as needed for wheezing or shortness of breath. 1 Inhaler 1  . ARIPiprazole (ABILIFY) 10 MG tablet Take 1 tablet (10 mg total) by mouth at bedtime. 90 tablet 1  . Ascorbic Acid (VITAMIN C PO) Take 1 tablet by mouth daily.    Marland Kitchen aspirin EC 81 MG tablet Take 1 tablet (81 mg total) by mouth daily. 90 tablet 3  . budesonide-formoterol (SYMBICORT) 80-4.5 MCG/ACT inhaler Inhale 2 puffs into the lungs 2 (two) times daily. 1 Inhaler 0  . buPROPion (WELLBUTRIN XL) 300 MG 24 hr tablet Take 1 tablet (300 mg total) by mouth daily. 90 tablet 1  . CALCIUM PO Take 1 tablet by mouth daily.    . chlorpheniramine-HYDROcodone (TUSSIONEX) 10-8 MG/5ML SUER Take 5 mLs by mouth every 12 (twelve) hours as needed for up to 5 days for  cough. 50 mL 0  . Cyanocobalamin (VITAMIN B 12 PO) Take 1 tablet by mouth daily.    . cyclobenzaprine (FLEXERIL) 5 MG tablet Take 1-2 tablets (5-10 mg total) by mouth at bedtime. 30 tablet 1  . doxycycline (VIBRA-TABS) 100 MG tablet Take 1 tablet (100 mg total) by mouth 2 (two) times daily for 7 days. 14 tablet 0  . Estradiol 10 MCG TABS vaginal tablet Place vaginally.    Marland Kitchen GLUCOSAMINE-CHONDROITIN PO Take 1 tablet by mouth daily.    Marland Kitchen lisinopril (PRINIVIL,ZESTRIL) 20 MG tablet Take 1 tablet (20 mg total) by mouth daily. 90 tablet 1  . Lorcaserin HCl ER (BELVIQ XR) 20 MG TB24 Take 20 mg by mouth daily. 30 tablet 2  . Magnesium Glycinate POWD 400 mg by Does not apply route daily.  0  . metoprolol succinate (TOPROL-XL) 50 MG 24 hr tablet Take 1 tablet (50 mg total) by mouth daily. 90 tablet 1  . Omega-3 Fatty Acids (FISH OIL PO) Take 1 tablet by mouth daily.    Marland Kitchen omeprazole (PRILOSEC) 20 MG capsule  Take 1 capsule (20 mg total) by mouth daily. 90 capsule 1  . simvastatin (ZOCOR) 20 MG tablet Take 1 tablet (20 mg total) by mouth at bedtime. TAKE 1 TABLET BY MOUTH DAILY 90 tablet 2  . VITAMIN E PO Take 1 tablet by mouth daily.     No current facility-administered medications for this visit.    Allergies  Allergen Reactions  . Metformin And Related Other (See Comments)    Lactic acidosis   . Penicillins Hives and Rash    Has patient had a PCN reaction causing immediate rash, facial/tongue/throat swelling, SOB or lightheadedness with hypotension: Yes Has patient had a PCN reaction causing severe rash involving mucus membranes or skin necrosis:No Has patient had a PCN reaction that required hospitalization: Yes Has patient had a PCN reaction occurring within the last 10 years: No If all of the above answers are "NO", then may proceed with Cephalosporin use.        Objective:  BP 129/85   Pulse 69   Temp 98.2 F (36.8 C) (Oral)   SpO2 100%  Gen:  alert, not ill-appearing, no distress, appropriate for age 39: head normocephalic without obvious abnormality, conjunctiva and cornea clear, wearing glasses, TM's pearly gray and semi-transparent, oropharynx clear, moist mucous membranes, neck supple, no cervical adenopathy, trachea midline Pulm: Normal work of breathing, normal phonation, clear to auscultation bilaterally, no wheezes, rales or rhonchi CV: Normal rate, regular rhythm, s1 and s2 distinct, no murmurs, clicks or rubs  Neuro: alert and oriented x 3, no tremor MSK: extremities atraumatic, normal gait and station Skin: warm, diaphoretic, no rashes Psych: well-groomed, cooperative, good eye contact, euthymic mood, affect mood-congruent, speech is articulate, and thought processes clear and goal-directed    No results found for this or any previous visit (from the past 72 hour(s)). No results found.    Assessment and Plan: 62 y.o. female with   Acute lower  respiratory infection - Plan: doxycycline (VIBRA-TABS) 100 MG tablet, budesonide-formoterol (SYMBICORT) 80-4.5 MCG/ACT inhaler - afebrile, no tachypnea, no tachycardia, SpO2 100% on RA - patient has multiple risk factors for CAP: tobacco use, alcohol misuse. She was also recently hospitalizated 8 weeks ago for sepsis, acute colitis, and AKI. Treating with Doxycycline bid x 7 days. Will also start Symbicort 2 puffs bid for possible underlying obstructive airway disease. Recommend follow-up spirometry in 3 weeks  Patient education and anticipatory guidance  given Patient agrees with treatment plan Follow-up as needed if symptoms worsen or fail to improve  Darlyne Russian PA-C

## 2018-06-17 MED FILL — METOPROLOL SUCCINATE ER 50: 50 | 30 days supply | Qty: 30 | Fill #4

## 2018-06-28 ENCOUNTER — Other Ambulatory Visit: Payer: Self-pay

## 2018-07-01 MED FILL — OMEPRAZOLE 20 MG CAP: 20 | 90 days supply | Qty: 90 | Fill #1

## 2018-07-13 ENCOUNTER — Ambulatory Visit (HOSPITAL_COMMUNITY): Payer: No Typology Code available for payment source | Admitting: Psychiatry

## 2018-07-20 MED FILL — METOPROLOL SUCCINATE ER 50: 50 | 30 days supply | Qty: 30 | Fill #5

## 2018-07-20 MED FILL — BUPROPION HCL XL 300 MG TAB: 300 | 90 days supply | Qty: 90 | Fill #1

## 2018-08-04 ENCOUNTER — Ambulatory Visit (HOSPITAL_COMMUNITY): Payer: No Typology Code available for payment source | Admitting: Psychiatry

## 2018-08-04 ENCOUNTER — Encounter (HOSPITAL_COMMUNITY): Payer: Self-pay | Admitting: Psychiatry

## 2018-08-04 VITALS — BP 141/90 | HR 79 | Ht 66.0 in | Wt 199.6 lb

## 2018-08-04 DIAGNOSIS — F17210 Nicotine dependence, cigarettes, uncomplicated: Secondary | ICD-10-CM | POA: Diagnosis not present

## 2018-08-04 DIAGNOSIS — F324 Major depressive disorder, single episode, in partial remission: Secondary | ICD-10-CM

## 2018-08-04 MED ORDER — GABAPENTIN 300 MG PO CAPS: ORAL_CAPSULE | ORAL | 2 refills | Status: DC

## 2018-08-04 MED ORDER — TEMAZEPAM 15 MG PO CAPS: ORAL_CAPSULE | ORAL | 2 refills | Status: DC

## 2018-08-04 MED FILL — SIMVASTATIN 20 MG TABLET: 20 | 90 days supply | Qty: 90 | Fill #2

## 2018-08-04 MED FILL — TEMAZEPAM 15 MG CAPSULE: 15 | 30 days supply | Qty: 60 | Fill #0

## 2018-08-04 MED FILL — ARIPiprazole 10 MG TABS: 10 | 90 days supply | Qty: 90 | Fill #1

## 2018-08-04 MED FILL — ASPIRIN ADULT LOW STRENGTH: 81 | 90 days supply | Qty: 90 | Fill #2

## 2018-08-04 MED FILL — GABAPENTIN 300 MG CAPSULE: 300 | 30 days supply | Qty: 60 | Fill #0

## 2018-08-04 NOTE — Progress Notes (Signed)
Psychiatric Initial Adult Assessment   Patient Identification: Mary Stark MRN:  [id number] Date of Evaluation:  08/04/2018 Referral Source: Dr. Sherlie Ban Chief Complaint:   Visit Diagnosis:  No diagnosis found.  History of Present Illness:    At this time the patient is stable. There is no particular changes. 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 Because her grandchildren and her daughter moved in her husband is moved out. The patient problem is related to alcohol well. She is drinking more and more alcohol during the week that she doesn't work. The patient denies being depressed. She says she sleeps poorly. Her appetite is good. She says she's got good energy and can think and concentrate without problems. She's a good sense of worth. The patient denies the use of illicit drugs. The patient has hepatitis C is being treated for that condition. She has issues with her liver. The patient is no evidence of psychosis. Patient is not all that anxious. She says she does not feel that well once more active life. She says she'll have to think of new things to do one or 2 grandchildren in school.  Associated Signs/Symptoms: Depression Symptoms:  feelings of worthlessness/guilt, (Hypo) Manic Symptoms:   Anxiety Symptoms:   Psychotic Symptoms:   PTSD Symptoms:   Past Psychiatric History: multiple presents now taking Abilify 10 mg Wellbutrin  300 mg  Previous Psychotropic Medications: as above  Substance Abuse History in the last 12 months:  Yes.    Consequences of Substance Abuse: Medical Consequences:    Past Medical History:  Past Medical History:  Diagnosis Date  . Alcohol dependence ([place])   . Depression 07/17/2013  . History of hepatitis C 07/17/2013  . History of rheumatic fever   . Hyperlipidemia 07/17/2013  . Hypertension   . Tobacco use   . Toe fracture, right 10/07/2016  .  Vitamin D deficiency 07/17/2013    Past Surgical History:  Procedure Laterality Date  . CESAREAN SECTION  1984  . FINGER SURGERY Right    5th  . GYNECOLOGIC CRYOSURGERY    . TONSILLECTOMY      Family Psychiatric History:   Family History:  Family History  Problem Relation Age of Onset  . Ovarian cancer Mother   . Cardiomyopathy Father   . Valvular heart disease Father   . Liver cancer Brother   . Protein C deficiency Brother   . Protein C deficiency Brother   . Stroke Brother     Social History:   Social History   Socioeconomic History  . Marital status: Married    Spouse name: Not on file  . Number of children: Not on file  . Years of education: Not on file  . Highest education level: Not on file  Occupational History  . Not on file  Social Needs  . Financial resource strain: Not on file  . Food insecurity:    Worry: Not on file    Inability: Not on file  . Transportation needs:    Medical: Not on file    Non-medical: Not on file  Tobacco Use  . Smoking status: Current Some Day Smoker    Packs/day: 1.00    Years: 25.00    Pack years: 25.00    Types: Cigarettes    Last attempt to quit: 12/17/2012    Years since quitting: 5.6  . Smokeless tobacco: Never Used  .  Tobacco comment: <1 cig per month  Substance and Sexual Activity  . Alcohol use: Yes    Alcohol/week: 24.0 - 36.0 standard drinks    Types: 24 - 36 Cans of beer per week  . Drug use: No  . Sexual activity: Yes    Birth control/protection: None  Lifestyle  . Physical activity:    Days per week: Not on file    Minutes per session: Not on file  . Stress: Not on file  Relationships  . Social connections:    Talks on phone: Not on file    Gets together: Not on file    Attends religious service: Not on file    Active member of club or organization: Not on file    Attends meetings of clubs or organizations: Not on file    Relationship status: Not on file  Other Topics Concern  . Not on file   Social History Narrative  . Not on file    Additional Social History:   Allergies:   Allergies  Allergen Reactions  . Metformin And Related Other (See Comments)    Lactic acidosis   . Penicillins Hives and Rash    Has patient had a PCN reaction causing immediate rash, facial/tongue/throat swelling, SOB or lightheadedness with hypotension: Yes Has patient had a PCN reaction causing severe rash involving mucus membranes or skin necrosis:No Has patient had a PCN reaction that required hospitalization: Yes Has patient had a PCN reaction occurring within the last 10 years: No If all of the above answers are "NO", then may proceed with Cephalosporin use.     Metabolic Disorder Labs: Lab Results  Component Value Date   HGBA1C 5.6 02/07/2013   No results found for: PROLACTIN Lab Results  Component Value Date   CHOL 183 01/10/2018   TRIG 79 01/10/2018   HDL 83 01/10/2018   CHOLHDL 2.2 01/10/2018   VLDL 20 08/30/2013   LDLCALC 84 01/10/2018   LDLCALC 78 08/30/2013     Current Medications: Current Outpatient Medications  Medication Sig Dispense Refill  . ARIPiprazole (ABILIFY) 10 MG tablet Take 1 tablet (10 mg total) by mouth at bedtime. 90 tablet 1  . buPROPion (WELLBUTRIN XL) 300 MG 24 hr tablet Take 1 tablet (300 mg total) by mouth daily. 90 tablet 1  . Estradiol 10 MCG TABS vaginal tablet Place vaginally.    Marland Kitchen lisinopril (PRINIVIL,ZESTRIL) 20 MG tablet Take 1 tablet (20 mg total) by mouth daily. 90 tablet 1  . Lorcaserin HCl ER (BELVIQ XR) 20 MG TB24 Take 20 mg by mouth daily. 30 tablet 2  . metoprolol succinate (TOPROL-XL) 50 MG 24 hr tablet Take 1 tablet (50 mg total) by mouth daily. 90 tablet 1  . omeprazole (PRILOSEC) 20 MG capsule Take 1 capsule (20 mg total) by mouth daily. 90 capsule 1  . simvastatin (ZOCOR) 20 MG tablet Take 1 tablet (20 mg total) by mouth at bedtime. TAKE 1 TABLET BY MOUTH DAILY 90 tablet 2  . albuterol (PROVENTIL HFA;VENTOLIN HFA) 108 (90 Base)  MCG/ACT inhaler Inhale 1-2 puffs into the lungs every 4 (four) hours as needed for wheezing or shortness of breath. (Patient not taking: Reported on 08/04/2018) 1 Inhaler 1  . Ascorbic Acid (VITAMIN C PO) Take 1 tablet by mouth daily.    Marland Kitchen aspirin EC 81 MG tablet Take 1 tablet (81 mg total) by mouth daily. (Patient not taking: Reported on 08/04/2018) 90 tablet 3  . budesonide-formoterol (SYMBICORT) 80-4.5 MCG/ACT inhaler Inhale 2  puffs into the lungs 2 (two) times daily. (Patient not taking: Reported on 08/04/2018) 1 Inhaler 0  . CALCIUM PO Take 1 tablet by mouth daily.    . Cyanocobalamin (VITAMIN B 12 PO) Take 1 tablet by mouth daily.    . cyclobenzaprine (FLEXERIL) 5 MG tablet Take 1-2 tablets (5-10 mg total) by mouth at bedtime. (Patient not taking: Reported on 08/04/2018) 30 tablet 1  . gabapentin (NEURONTIN) 300 MG capsule 1  bid 60 capsule 2  . GLUCOSAMINE-CHONDROITIN PO Take 1 tablet by mouth daily.    . Magnesium Glycinate POWD 400 mg by Does not apply route daily. (Patient not taking: Reported on 08/04/2018)  0  . Omega-3 Fatty Acids (FISH OIL PO) Take 1 tablet by mouth daily.    . temazepam (RESTORIL) 15 MG capsule 1  qhs  If fails in 4 nights go to 2 60 capsule 2  . VITAMIN E PO Take 1 tablet by mouth daily.     No current facility-administered medications for this visit.     Neurologic: Headache: No Seizure: No Paresthesias:No  Musculoskeletal: Strength & Muscle Tone: within normal limits Gait & Station: normal Patient leans: N/A  Psychiatric Specialty Exam: ROS  Blood pressure (!) 141/90, pulse 79, height 5\' 6"  (1.676 m), weight 199 lb 9.6 oz (90.5 kg), SpO2 99 %.Body mass index is 32.22 kg/m.  General Appearance: Casual  Eye Contact:  Good  Speech:  NA  Volume:  Normal  Mood:  Negative  Affect:  Appropriate  Thought Process:  Goal Directed  Orientation:  Full (Time, Place, and Person)  Thought Content:  Logical  Suicidal Thoughts:  No  Homicidal Thoughts:  No   Memory:  NA Immediate;   Good  Judgement:  Good  Insight:  Good  Psychomotor Activity:  NA  Concentration:    Recall:  Good  Fund of Knowledge:Good  Language: Good  Akathisia:  No  Handed:  Right  AIMS (if indicated):    Assets:  Desire for Improvement  ADL's:  Intact  Cognition: WNL  Sleep:      Treatment Plan Summary:  The first problem for this patient is that of clinical depression. He evidence for this is somewhat equivocal. Nonetheless she continues taking Wellbutrin and 10 mg of Abilify. Today went over the warnings about having Wellbutrin inside while cutting down her alcohol and the risk of seizures. If the patient has gone slowly they should be admitted issue. The patient is well aware of these were problems. Her second problem is that of alcohol dependency. Today we asked the patient to begin on Neurontin low-dose and we gave her sleeping pill. She claims one of the reason she drinks is because it helps her sleep. We'll attempt to remove that motive. Her third problem is that of insomnia.this is probably related to alcohol as well as depression. It is reasonable to treat this with Restoril 15 mg 1 or 2. This patient is not suicidal. She denies any physical complaints. She denies chest pain or shortness of breath she denies any neurological symptoms all. She has no nausea or vomiting. The patient return to see me in 2 months. She'll attempt to reduce her alcohol. As she reduces her alcohol and will effect increase her Neurontin. I'm using Neurontin for anxiety and hopefully to reduce her drive to drink.   Jerral Ralph, MD 8/22/20194:17 PM

## 2018-08-09 ENCOUNTER — Other Ambulatory Visit: Payer: Self-pay | Admitting: Physician Assistant

## 2018-08-09 DIAGNOSIS — I10 Essential (primary) hypertension: Secondary | ICD-10-CM

## 2018-08-09 MED FILL — LISINOPRIL 20 MG TABLET: 20 | 90 days supply | Qty: 90 | Fill #0

## 2018-08-22 MED FILL — METOPROLOL SUCCINATE ER 50: 50 | 90 days supply | Qty: 90 | Fill #0

## 2018-08-23 ENCOUNTER — Ambulatory Visit: Payer: Self-pay | Admitting: Family Medicine

## 2018-09-01 MED FILL — GABAPENTIN 300 MG CAPSULE: 300 | 30 days supply | Qty: 60 | Fill #1

## 2018-09-14 ENCOUNTER — Ambulatory Visit: Payer: Self-pay

## 2018-09-23 ENCOUNTER — Other Ambulatory Visit: Payer: Self-pay | Admitting: Physician Assistant

## 2018-09-23 DIAGNOSIS — Z791 Long term (current) use of non-steroidal anti-inflammatories (NSAID): Secondary | ICD-10-CM

## 2018-09-23 MED FILL — OMEPRAZOLE 20 MG CPDR: 20 | 90 days supply | Qty: 90 | Fill #0

## 2018-09-23 MED FILL — TEMAZEPAM 15 MG CAPSULE: 15 | 30 days supply | Qty: 60 | Fill #1

## 2018-10-06 MED FILL — GABAPENTIN 300 MG CAPSULE: 300 | 30 days supply | Qty: 60 | Fill #2

## 2018-10-12 ENCOUNTER — Encounter (HOSPITAL_COMMUNITY): Payer: Self-pay | Admitting: Psychiatry

## 2018-10-12 ENCOUNTER — Other Ambulatory Visit (HOSPITAL_COMMUNITY): Payer: Self-pay

## 2018-10-12 ENCOUNTER — Ambulatory Visit (INDEPENDENT_AMBULATORY_CARE_PROVIDER_SITE_OTHER): Payer: No Typology Code available for payment source | Admitting: Psychiatry

## 2018-10-12 VITALS — BP 127/85 | HR 68 | Ht 66.0 in | Wt 198.0 lb

## 2018-10-12 DIAGNOSIS — F325 Major depressive disorder, single episode, in full remission: Secondary | ICD-10-CM | POA: Diagnosis not present

## 2018-10-12 DIAGNOSIS — F3341 Major depressive disorder, recurrent, in partial remission: Secondary | ICD-10-CM

## 2018-10-12 DIAGNOSIS — F418 Other specified anxiety disorders: Secondary | ICD-10-CM | POA: Diagnosis not present

## 2018-10-12 MED ORDER — ARIPIPRAZOLE 5 MG PO TABS: 10.0000 mg | ORAL_TABLET | Freq: Every day | ORAL | 5 refills | Status: DC

## 2018-10-12 MED ORDER — BUPROPION HCL ER (XL) 300 MG PO TB24: 300.0000 mg | ORAL_TABLET | Freq: Every day | ORAL | 1 refills | Status: DC

## 2018-10-12 MED ORDER — NALTREXONE HCL 50 MG PO TABS
50.0000 mg | ORAL_TABLET | Freq: Every day | ORAL | 6 refills | Status: DC
Start: 2018-10-12 — End: 2019-01-11

## 2018-10-12 MED ORDER — TEMAZEPAM 15 MG PO CAPS: ORAL_CAPSULE | ORAL | 2 refills | Status: DC

## 2018-10-12 MED ORDER — ARIPIPRAZOLE 5 MG PO TABS: 5.0000 mg | ORAL_TABLET | Freq: Every day | ORAL | 5 refills | Status: DC

## 2018-10-12 MED FILL — NALTREXONE 50 MG TABLET: 50 | 30 days supply | Qty: 30 | Fill #0

## 2018-10-12 MED FILL — BuPROPion HCL ER (XL) 300 M: 300 | 90 days supply | Qty: 90 | Fill #0

## 2018-10-12 NOTE — Progress Notes (Signed)
Psychiatric Initial Adult Assessment   Patient Identification: Mary Stark MRN:  [id number] Date of Evaluation:  10/12/2018 Referral Source: Dr. Sherlie Ban Chief Complaint:   Visit Diagnosis:    ICD-10-CM   1. Depression with anxiety F41.8 ARIPiprazole (ABILIFY) 5 MG tablet  2. Recurrent major depressive disorder, in partial remission (HCC) F33.41 buPROPion (WELLBUTRIN XL) 300 MG 24 hr tablet    History of Present Illness:    Today the patient is doing very well.  She says that she is better.  Great news is that she stopped drinking alcohol about a month ago.  She does feel better.  Her husband says she is in a much better mood.  She is much less irritable.  Is likely is related to the cutting down the alcohol but it might also be related to the fact that she is sleeping much better.  She takes Restoril for sleep.  The patient's only complaint is that she is gaining weight.  She suspects is related to the Neurontin.  I doubt it is but nonetheless it is reasonable to discontinue the Neurontin and change her to naltrexone 50 mg.  At this time the patient is physically doing very well.  She says she is cured from hepatitis C.  We will confirm that her liver enzymes are normal.  I think naltrexone will be safer.  The patient is eating fairly well she got good energy.  She denies the use of any drugs.  Denies any psychotic symptoms.  Her mood is good taking Wellbutrin.  She is been on Abilify 10 mg for many years.  He shows no evidence of tardive dyskinesia.  The patient has not been in a psychiatric hospital retired all time.  Apparently she is been in for alcohol treatment.  The patient denies a sense of worthlessness.  She is not suicidal not homicidal and is functioning very well. Associated Signs/Symptoms: Depression Symptoms:  feelings of worthlessness/guilt, (Hypo) Manic Symptoms:   Anxiety Symptoms:   Psychotic Symptoms:   PTSD Symptoms:   Past Psychiatric History: multiple  presents now taking Abilify 10 mg Wellbutrin  300 mg  Previous Psychotropic Medications: as above  Substance Abuse History in the last 12 months:  Yes.    Consequences of Substance Abuse: Medical Consequences:    Past Medical History:  Past Medical History:  Diagnosis Date  . Alcohol dependence ([place])   . Depression 07/17/2013  . History of hepatitis C 07/17/2013  . History of rheumatic fever   . Hyperlipidemia 07/17/2013  . Hypertension   . Tobacco use   . Toe fracture, right 10/07/2016  . Vitamin D deficiency 07/17/2013    Past Surgical History:  Procedure Laterality Date  . CESAREAN SECTION  1984  . FINGER SURGERY Right    5th  . GYNECOLOGIC CRYOSURGERY    . TONSILLECTOMY      Family Psychiatric History:   Family History:  Family History  Problem Relation Age of Onset  . Ovarian cancer Mother   . Cardiomyopathy Father   . Valvular heart disease Father   . Liver cancer Brother   . Protein C deficiency Brother   . Protein C deficiency Brother   . Stroke Brother     Social History:   Social History   Socioeconomic History  . Marital status: Married    Spouse name: Not on file  . Number of children: Not on file  . Years of education: Not on file  . Highest education level: Not on  file  Occupational History  . Not on file  Social Needs  . Financial resource strain: Not on file  . Food insecurity:    Worry: Not on file    Inability: Not on file  . Transportation needs:    Medical: Not on file    Non-medical: Not on file  Tobacco Use  . Smoking status: Current Some Day Smoker    Packs/day: 1.00    Years: 25.00    Pack years: 25.00    Types: Cigarettes    Last attempt to quit: 12/17/2012    Years since quitting: 5.8  . Smokeless tobacco: Never Used  . Tobacco comment: <1 cig per month  Substance and Sexual Activity  . Alcohol use: Yes    Alcohol/week: 24.0 - 36.0 standard drinks    Types: 24 - 36 Cans of beer per week  . Drug use: No  . Sexual activity:  Yes    Birth control/protection: None  Lifestyle  . Physical activity:    Days per week: Not on file    Minutes per session: Not on file  . Stress: Not on file  Relationships  . Social connections:    Talks on phone: Not on file    Gets together: Not on file    Attends religious service: Not on file    Active member of club or organization: Not on file    Attends meetings of clubs or organizations: Not on file    Relationship status: Not on file  Other Topics Concern  . Not on file  Social History Narrative  . Not on file    Additional Social History:   Allergies:   Allergies  Allergen Reactions  . Metformin And Related Other (See Comments)    Lactic acidosis   . Penicillins Hives and Rash    Has patient had a PCN reaction causing immediate rash, facial/tongue/throat swelling, SOB or lightheadedness with hypotension: Yes Has patient had a PCN reaction causing severe rash involving mucus membranes or skin necrosis:No Has patient had a PCN reaction that required hospitalization: Yes Has patient had a PCN reaction occurring within the last 10 years: No If all of the above answers are "NO", then may proceed with Cephalosporin use.     Metabolic Disorder Labs: Lab Results  Component Value Date   HGBA1C 5.6 02/07/2013   No results found for: PROLACTIN Lab Results  Component Value Date   CHOL 183 01/10/2018   TRIG 79 01/10/2018   HDL 83 01/10/2018   CHOLHDL 2.2 01/10/2018   VLDL 20 08/30/2013   LDLCALC 84 01/10/2018   LDLCALC 78 08/30/2013     Current Medications: Current Outpatient Medications  Medication Sig Dispense Refill  . ARIPiprazole (ABILIFY) 5 MG tablet Take 2 tablets (10 mg total) by mouth at bedtime. 30 tablet 5  . Ascorbic Acid (VITAMIN C PO) Take 1 tablet by mouth daily.    Marland Kitchen aspirin EC 81 MG tablet Take 1 tablet (81 mg total) by mouth daily. 90 tablet 3  . budesonide-formoterol (SYMBICORT) 80-4.5 MCG/ACT inhaler Inhale 2 puffs into the lungs 2  (two) times daily. 1 Inhaler 0  . buPROPion (WELLBUTRIN XL) 300 MG 24 hr tablet Take 1 tablet (300 mg total) by mouth daily. 90 tablet 1  . CALCIUM PO Take 1 tablet by mouth daily.    . Coenzyme Q10 (CO Q 10 PO) Take by mouth.    . Cyanocobalamin (VITAMIN B 12 PO) Take 1 tablet by mouth daily.    Marland Kitchen  cyclobenzaprine (FLEXERIL) 5 MG tablet Take 1-2 tablets (5-10 mg total) by mouth at bedtime. 30 tablet 1  . Estradiol 10 MCG TABS vaginal tablet Place vaginally.    Marland Kitchen GLUCOSAMINE-CHONDROITIN PO Take 1 tablet by mouth daily.    Marland Kitchen lisinopril (PRINIVIL,ZESTRIL) 20 MG tablet TAKE 1 TABLET (20 MG TOTAL) BY MOUTH DAILY. 90 tablet 0  . Magnesium Glycinate POWD 400 mg by Does not apply route daily.  0  . metoprolol succinate (TOPROL-XL) 50 MG 24 hr tablet Take 1 tablet (50 mg total) by mouth daily. 90 tablet 1  . Omega-3 Fatty Acids (FISH OIL PO) Take 1 tablet by mouth daily.    Marland Kitchen omeprazole (PRILOSEC) 20 MG capsule TAKE 1 CAPSULE (20 MG TOTAL) BY MOUTH DAILY. 90 capsule 1  . simvastatin (ZOCOR) 20 MG tablet Take 1 tablet (20 mg total) by mouth at bedtime. TAKE 1 TABLET BY MOUTH DAILY 90 tablet 2  . temazepam (RESTORIL) 15 MG capsule 1  qhs  If fails in 4 nights go to 2 60 capsule 2  . VITAMIN E PO Take 1 tablet by mouth daily.    Marland Kitchen albuterol (PROVENTIL HFA;VENTOLIN HFA) 108 (90 Base) MCG/ACT inhaler Inhale 1-2 puffs into the lungs every 4 (four) hours as needed for wheezing or shortness of breath. (Patient not taking: Reported on 10/12/2018) 1 Inhaler 1  . Lorcaserin HCl ER (BELVIQ XR) 20 MG TB24 Take 20 mg by mouth daily. (Patient not taking: Reported on 10/12/2018) 30 tablet 2  . naltrexone (DEPADE) 50 MG tablet Take 1 tablet (50 mg total) by mouth daily. 30 tablet 6   No current facility-administered medications for this visit.     Neurologic: Headache: No Seizure: No Paresthesias:No  Musculoskeletal: Strength & Muscle Tone: within normal limits Gait & Station: normal Patient leans:  N/A  Psychiatric Specialty Exam: ROS  Blood pressure 127/85, pulse 68, height 5\' 6"  (1.676 m), weight 198 lb (89.8 kg), SpO2 100 %.Body mass index is 31.96 kg/m.  General Appearance: Casual  Eye Contact:  Good  Speech:  NA  Volume:  Normal  Mood:  Negative  Affect:  Appropriate  Thought Process:  Goal Directed  Orientation:  Full (Time, Place, and Person)  Thought Content:  Logical  Suicidal Thoughts:  No  Homicidal Thoughts:  No  Memory:  NA Immediate;   Good  Judgement:  Good  Insight:  Good  Psychomotor Activity:  NA  Concentration:    Recall:  Good  Fund of Knowledge:Good  Language: Good  Akathisia:  No  Handed:  Right  AIMS (if indicated):    Assets:  Desire for Improvement  ADL's:  Intact  Cognition: WNL  Sleep:      Treatment Plan Summary:  At this time the first problem for this patient is alcohol use disorder.  At this time we will discontinue her Neurontin and begin her on naltrexone 50 mg.  Her second problem is clinical depression.  She takes Wellbutrin and Abilify for this condition.  She will continue these agents but reduce her Abilify from 10 down to 5 mg.  We discussed about reducing it further in time.  Her third problem is that of a problem with sleep.  At this time she will continue taking Restoril.  At this time the patient is not in therapy.  Is doing very well.  She will return to see me in 9 weeks.  At this time the patient does not have fatigue.  I would describe her symptomatology  as being mild to moderate clearly improved.  She is more energy is more positive.  Jerral Ralph, MD 10/30/20191:41 PM

## 2018-10-24 MED FILL — TEMAZEPAM 15 MG CAPSULE: 15 | 30 days supply | Qty: 60 | Fill #2

## 2018-11-02 ENCOUNTER — Encounter: Payer: Self-pay | Admitting: Physician Assistant

## 2018-11-02 ENCOUNTER — Ambulatory Visit (INDEPENDENT_AMBULATORY_CARE_PROVIDER_SITE_OTHER): Payer: No Typology Code available for payment source | Admitting: Physician Assistant

## 2018-11-02 VITALS — BP 168/98 | HR 65 | Temp 97.9°F | Wt 202.0 lb

## 2018-11-02 DIAGNOSIS — I10 Essential (primary) hypertension: Secondary | ICD-10-CM | POA: Diagnosis not present

## 2018-11-02 DIAGNOSIS — J069 Acute upper respiratory infection, unspecified: Secondary | ICD-10-CM

## 2018-11-02 DIAGNOSIS — J019 Acute sinusitis, unspecified: Secondary | ICD-10-CM | POA: Diagnosis not present

## 2018-11-02 MED ORDER — IPRATROPIUM BROMIDE 0.06 % NA SOLN: 2.0000 | Freq: Four times a day (QID) | NASAL | 0 refills | Status: DC | PRN

## 2018-11-02 MED ORDER — DOXYCYCLINE HYCLATE 100 MG PO TABS: 100.0000 mg | ORAL_TABLET | Freq: Two times a day (BID) | ORAL | 0 refills | Status: AC

## 2018-11-02 MED ORDER — DEXTROMETHORPHAN-GUAIFENESIN 10-200 MG PO CAPS: ORAL_CAPSULE | ORAL | 0 refills | Status: DC

## 2018-11-02 MED ORDER — CHLORTHALIDONE 25 MG PO TABS: ORAL_TABLET | ORAL | 1 refills | Status: DC

## 2018-11-02 MED FILL — IPRATROPIUM 0.06% SPRAY: 0.06 | 21 days supply | Qty: 15 | Fill #0

## 2018-11-02 MED FILL — CHLORTHALIDONE 25 MG TABS: 25 | 45 days supply | Qty: 90 | Fill #0

## 2018-11-02 NOTE — Patient Instructions (Addendum)
For nasal symptoms/sinusitis: - prescription Atrovent nasal spray: 2 sprays each nostril, up to 4 times per day as needed - nasal saline rinses / netti pot (do this prior to nasal spray) - warm facial compresses - for sinus headache: Tylenol 1000mg  every 8 hours as needed. Alternate with Ibuprofen 600mg  every 6 hours  For cough: - Cough is a protective mechanism and an important part of fighting an infection. I encourage you to avoid suppressing your cough with medication during the day if possible.  - Mucinex with at least 8 oz. of water can loosen chest congestion and make cough more productive, which means you will actually cough less - Okay to use a cough suppressant at bedtime in order to rest (Nyquil, Delsym, Robitussin, etc.)  Note: follow package instructions for all over-the-counter medications. If using multi-symptom medications (Dayquil, Theraflu, etc.), check the label for duplicate drug ingredients.

## 2018-11-02 NOTE — Progress Notes (Signed)
HPI:                                                                Mary Stark is a 62 y.o. female who presents to [facility]: [facility] today for URI symptoms / headache  URI   This is a new problem. The current episode started in the past 7 days. The problem has been unchanged. There has been no fever. Associated symptoms include congestion, coughing (productive), headaches, nausea, rhinorrhea and sinus pain. Pertinent negatives include no chest pain. Associated symptoms comments: + malaise. She has tried decongestant, acetaminophen and NSAIDs (Mucinex) for the symptoms.   Also states home BP readings have been elevated. SBP140's-160's DBP upper 80's-mid90's She has been having frequent headaches. Denies vision change, chest pain, palpitations, orthopnea, dyspnea.     Past Medical History:  Diagnosis Date  . Alcohol dependence ([place])   . Depression 07/17/2013  . History of hepatitis C 07/17/2013  . History of rheumatic fever   . Hyperlipidemia 07/17/2013  . Hypertension   . Tobacco use   . Toe fracture, right 10/07/2016  . Vitamin D deficiency 07/17/2013   Past Surgical History:  Procedure Laterality Date  . CESAREAN SECTION  1984  . FINGER SURGERY Right    5th  . GYNECOLOGIC CRYOSURGERY    . TONSILLECTOMY     Social History   Tobacco Use  . Smoking status: Current Some Day Smoker    Packs/day: 1.00    Years: 25.00    Pack years: 25.00    Types: Cigarettes    Last attempt to quit: 12/17/2012    Years since quitting: 5.9  . Smokeless tobacco: Never Used  . Tobacco comment: <1 cig per month  Substance Use Topics  . Alcohol use: Yes    Alcohol/week: 24.0 - 36.0 standard drinks    Types: 24 - 36 Cans of beer per week   family history includes Cardiomyopathy in her father; Liver cancer in her brother; Ovarian cancer in her mother; Protein C deficiency in her brother and brother; Stroke in her brother; Valvular heart disease in her  father.    ROS: negative except as noted in the HPI  Medications: Current Outpatient Medications  Medication Sig Dispense Refill  . albuterol (PROVENTIL HFA;VENTOLIN HFA) 108 (90 Base) MCG/ACT inhaler Inhale 1-2 puffs into the lungs every 4 (four) hours as needed for wheezing or shortness of breath. 1 Inhaler 1  . ARIPiprazole (ABILIFY) 5 MG tablet Take 1 tablet (5 mg total) by mouth at bedtime. 30 tablet 5  . Ascorbic Acid (VITAMIN C PO) Take 1 tablet by mouth daily.    Marland Kitchen aspirin EC 81 MG tablet Take 1 tablet (81 mg total) by mouth daily. 90 tablet 3  . buPROPion (WELLBUTRIN XL) 300 MG 24 hr tablet Take 1 tablet (300 mg total) by mouth daily. 90 tablet 1  . CALCIUM PO Take 1 tablet by mouth daily.    . Coenzyme Q10 (CO Q 10 PO) Take by mouth.    . Cyanocobalamin (VITAMIN B 12 PO) Take 1 tablet by mouth daily.    . cyclobenzaprine (FLEXERIL) 5 MG tablet Take 1-2 tablets (5-10 mg total) by mouth at bedtime. 30 tablet 1  . Estradiol 10  MCG TABS vaginal tablet Place vaginally.    Marland Kitchen GLUCOSAMINE-CHONDROITIN PO Take 1 tablet by mouth daily.    Marland Kitchen lisinopril (PRINIVIL,ZESTRIL) 20 MG tablet TAKE 1 TABLET (20 MG TOTAL) BY MOUTH DAILY. 90 tablet 0  . Magnesium Glycinate POWD 400 mg by Does not apply route daily.  0  . metoprolol succinate (TOPROL-XL) 50 MG 24 hr tablet Take 1 tablet (50 mg total) by mouth daily. 90 tablet 1  . naltrexone (DEPADE) 50 MG tablet Take 1 tablet (50 mg total) by mouth daily. 30 tablet 6  . Omega-3 Fatty Acids (FISH OIL PO) Take 1 tablet by mouth daily.    Marland Kitchen omeprazole (PRILOSEC) 20 MG capsule TAKE 1 CAPSULE (20 MG TOTAL) BY MOUTH DAILY. 90 capsule 1  . temazepam (RESTORIL) 15 MG capsule 1  qhs  If fails in 4 nights go to 2 60 capsule 2  . VITAMIN E PO Take 1 tablet by mouth daily.    . chlorthalidone (HYGROTON) 25 MG tablet Start 1/2 tab (12.5 mg) PO QD x 1 week. Titrate to goal BP <130/80. Max dose 50 mg QD 90 tablet 1  . Dextromethorphan-guaiFENesin (CORICIDIN HBP  CONGESTION/COUGH) 10-200 MG CAPS Use as directed 168 each 0  . ipratropium (ATROVENT) 0.06 % nasal spray Place 2 sprays into both nostrils 4 (four) times daily as needed for rhinitis. 15 mL 0  . simvastatin (ZOCOR) 20 MG tablet Take 1 tablet (20 mg total) by mouth at bedtime. Due for labs 90 tablet 0   No current facility-administered medications for this visit.    Allergies  Allergen Reactions  . Metformin And Related Other (See Comments)    Lactic acidosis   . Penicillins Hives and Rash    Has patient had a PCN reaction causing immediate rash, facial/tongue/throat swelling, SOB or lightheadedness with hypotension: Yes Has patient had a PCN reaction causing severe rash involving mucus membranes or skin necrosis:No Has patient had a PCN reaction that required hospitalization: Yes Has patient had a PCN reaction occurring within the last 10 years: No If all of the above answers are "NO", then may proceed with Cephalosporin use.        Objective:  BP (!) 168/98   Pulse 65   Temp 97.9 F (36.6 C) (Oral)   Wt 202 lb (91.6 kg)   SpO2 96%   BMI 32.60 kg/m  Gen:  alert, ill-appearing, not toxic-appearing, no distress, appropriate for age 89: head normocephalic without obvious abnormality, conjunctiva and cornea clear, wearing glasses, tympanic membranes with air fluid level bilaterally, no bulging erythema, nasal mucosa edematous, there is frontal sinus tenderness, oropharynx clear, no tonsillar exudates, neck supple, no cervical adenopathy, trachea midline Pulm: Normal work of breathing, normal phonation, clear to auscultation bilaterally, no wheezes, rales or rhonchi CV: Normal rate, regular rhythm, s1 and s2 distinct, no murmurs, clicks or rubs  Neuro: alert and oriented x 3, no tremor MSK: extremities atraumatic, normal gait and station Skin: intact, no rashes on exposed skin, no jaundice, no cyanosis   Lab Results  Component Value Date   CREATININE 0.77 04/26/2018   BUN 22  04/26/2018   NA 142 04/26/2018   K 4.3 04/26/2018   CL 103 04/26/2018   CO2 23 04/26/2018      No results found for this or any previous visit (from the past 72 hour(s)). No results found.    Assessment and Plan: 62 y.o. female with   .Luara was seen today for cough.  Diagnoses and  all orders for this visit:  Acute upper respiratory infection -     Dextromethorphan-guaiFENesin (CORICIDIN HBP CONGESTION/COUGH) 10-200 MG CAPS; Use as directed -     ipratropium (ATROVENT) 0.06 % nasal spray; Place 2 sprays into both nostrils 4 (four) times daily as needed for rhinitis.  Essential hypertension with goal blood pressure less than 140/90 -     chlorthalidone (HYGROTON) 25 MG tablet; Start 1/2 tab (12.5 mg) PO QD x 1 week. Titrate to goal BP <130/80. Max dose 50 mg QD  Acute non-recurrent sinusitis, unspecified location -     doxycycline (VIBRA-TABS) 100 MG tablet; Take 1 tablet (100 mg total) by mouth 2 (two) times daily for 7 days.   Afebrile, no tachypnea, no tachycardia, pulse ox 96% on room air, no adventitious lung sounds. Given risk factors for pneumonia including current tobacco use prescription for doxycycline provided  Hypertension: Blood pressure in stage II hypertensive range in office and at home.  Adding chlorthalidone 12-1/2 mg daily, patient instructed to self titrate to goal blood pressure of less than 130/80.  Continue metoprolol 50 mg and lisinopril 20 mg.  Continue baby aspirin for primary prevention.   Patient education and anticipatory guidance given Patient agrees with treatment plan Follow-up in 2 weeks or sooner as needed if symptoms worsen or fail to improve  Darlyne Russian PA-C

## 2018-11-07 ENCOUNTER — Other Ambulatory Visit: Payer: Self-pay | Admitting: Physician Assistant

## 2018-11-07 MED FILL — SIMVASTATIN 20 MG TABLET: 20 | 90 days supply | Qty: 90 | Fill #0

## 2018-11-11 ENCOUNTER — Other Ambulatory Visit: Payer: Self-pay | Admitting: Physician Assistant

## 2018-11-11 DIAGNOSIS — I10 Essential (primary) hypertension: Secondary | ICD-10-CM

## 2018-11-11 MED FILL — ASPIRIN ADULT LOW STRENGTH: 81 | 90 days supply | Qty: 90 | Fill #3

## 2018-11-13 ENCOUNTER — Encounter: Payer: Self-pay | Admitting: Physician Assistant

## 2018-11-14 MED FILL — SM BLOOD PRESSURE MONITOR: 30 days supply | Qty: 1 | Fill #0

## 2018-11-14 MED FILL — LISINOPRIL 20 MG TABLET: 20 | 90 days supply | Qty: 90 | Fill #0

## 2018-11-21 MED FILL — METOPROLOL SUCCINATE ER 50: 50 | 90 days supply | Qty: 90 | Fill #1

## 2018-11-21 MED FILL — ARIPiprazole 5 MG TABS: 5 | 30 days supply | Qty: 30 | Fill #0

## 2018-11-30 ENCOUNTER — Encounter: Payer: Self-pay | Admitting: Physician Assistant

## 2018-11-30 ENCOUNTER — Ambulatory Visit (INDEPENDENT_AMBULATORY_CARE_PROVIDER_SITE_OTHER): Payer: No Typology Code available for payment source | Admitting: Physician Assistant

## 2018-11-30 VITALS — BP 128/85 | HR 73 | Wt 195.0 lb

## 2018-11-30 DIAGNOSIS — N952 Postmenopausal atrophic vaginitis: Secondary | ICD-10-CM

## 2018-11-30 DIAGNOSIS — N179 Acute kidney failure, unspecified: Secondary | ICD-10-CM | POA: Diagnosis not present

## 2018-11-30 DIAGNOSIS — E876 Hypokalemia: Secondary | ICD-10-CM

## 2018-11-30 DIAGNOSIS — I10 Essential (primary) hypertension: Secondary | ICD-10-CM

## 2018-11-30 DIAGNOSIS — R944 Abnormal results of kidney function studies: Secondary | ICD-10-CM | POA: Diagnosis not present

## 2018-11-30 MED ORDER — PRASTERONE 6.5 MG VA INST: 1.0000 "application " | VAGINAL_INSERT | Freq: Every day | VAGINAL | 11 refills | Status: DC

## 2018-11-30 MED FILL — INTRAROSA 6.5 MG VAG INSERT: 6.5 | 28 days supply | Qty: 28 | Fill #0

## 2018-11-30 NOTE — Progress Notes (Signed)
HPI:                                                                Mary Stark is a 62 y.o. female who presents to [facility] today for hypertension  HTN: at last OV, BP was not controlled. Chlorthalidone added to Lisinopril and Metoprolol. She self-titrated to 50 mg of Chlorthalidone QD. Compliant with medications.  Reports that she has only been checking her blood pressure in the morning before she takes her medication.  She also self discontinued naltrexone because she was concerned it could be causing elevated blood pressure.  This was around the same time as starting the chlorthalidone so she cannot say for sure. She also cut down She denies vision change, headache, chest pain with exertion, orthopnea, lightheadedness, syncope and edema. Risk factors include: postmenopausal, HLD, obesity  Also c/o vaginal dryness. She ran out of estradiol tablets months ago.  She is still following with Psychiatry. Her Wellbutrin and Abilify doses were reduced. She is not sure if this is working for her. She states she is having increased fatigue.  Past Medical History:  Diagnosis Date  . Alcohol dependence ([place])   . Depression 07/17/2013  . History of hepatitis C 07/17/2013  . History of rheumatic fever   . Hyperlipidemia 07/17/2013  . Hypertension   . Tobacco use   . Toe fracture, right 10/07/2016  . Vitamin D deficiency 07/17/2013   Past Surgical History:  Procedure Laterality Date  . CESAREAN SECTION  1984  . FINGER SURGERY Right    5th  . GYNECOLOGIC CRYOSURGERY    . TONSILLECTOMY     Social History   Tobacco Use  . Smoking status: Current Some Day Smoker    Packs/day: 1.00    Years: 25.00    Pack years: 25.00    Types: Cigarettes    Last attempt to quit: 12/17/2012    Years since quitting: 5.9  . Smokeless tobacco: Never Used  . Tobacco comment: <1 cig per month  Substance Use Topics  . Alcohol use: Yes    Alcohol/week: 24.0 - 36.0  standard drinks    Types: 24 - 36 Cans of beer per week   family history includes Cardiomyopathy in her father; Liver cancer in her brother; Ovarian cancer in her mother; Protein C deficiency in her brother and brother; Stroke in her brother; Valvular heart disease in her father.    ROS: negative except as noted in the HPI  Medications: Current Outpatient Medications  Medication Sig Dispense Refill  . albuterol (PROVENTIL HFA;VENTOLIN HFA) 108 (90 Base) MCG/ACT inhaler Inhale 1-2 puffs into the lungs every 4 (four) hours as needed for wheezing or shortness of breath. 1 Inhaler 1  . ARIPiprazole (ABILIFY) 5 MG tablet Take 1 tablet (5 mg total) by mouth at bedtime. 30 tablet 5  . Ascorbic Acid (VITAMIN C PO) Take 1 tablet by mouth daily.    Marland Kitchen aspirin EC 81 MG tablet Take 1 tablet (81 mg total) by mouth daily. 90 tablet 3  . buPROPion (WELLBUTRIN XL) 300 MG 24 hr tablet Take 1 tablet (300 mg total) by mouth daily. 90 tablet 1  . CALCIUM PO Take 1 tablet by mouth daily.    Marland Kitchen  chlorthalidone (HYGROTON) 25 MG tablet Start 1/2 tab (12.5 mg) PO QD x 1 week. Titrate to goal BP <130/80. Max dose 50 mg QD 90 tablet 1  . Coenzyme Q10 (CO Q 10 PO) Take by mouth.    . Cyanocobalamin (VITAMIN B 12 PO) Take 1 tablet by mouth daily.    . Estradiol 10 MCG TABS vaginal tablet Place vaginally.    Marland Kitchen GLUCOSAMINE-CHONDROITIN PO Take 1 tablet by mouth daily.    Marland Kitchen lisinopril (PRINIVIL,ZESTRIL) 20 MG tablet TAKE 1 TABLET (20 MG TOTAL) BY MOUTH DAILY. 90 tablet 0  . Magnesium Glycinate POWD 400 mg by Does not apply route daily.  0  . metoprolol succinate (TOPROL-XL) 50 MG 24 hr tablet Take 1 tablet (50 mg total) by mouth daily. 90 tablet 1  . naltrexone (DEPADE) 50 MG tablet Take 1 tablet (50 mg total) by mouth daily. 30 tablet 6  . Omega-3 Fatty Acids (FISH OIL PO) Take 1 tablet by mouth daily.    Marland Kitchen omeprazole (PRILOSEC) 20 MG capsule TAKE 1 CAPSULE (20 MG TOTAL) BY MOUTH DAILY. 90 capsule 1  . simvastatin (ZOCOR)  20 MG tablet Take 1 tablet (20 mg total) by mouth at bedtime. Due for labs 90 tablet 0  . temazepam (RESTORIL) 15 MG capsule 1  qhs  If fails in 4 nights go to 2 60 capsule 2  . VITAMIN E PO Take 1 tablet by mouth daily.     No current facility-administered medications for this visit.    Allergies  Allergen Reactions  . Metformin And Related Other (See Comments)    Lactic acidosis   . Penicillins Hives and Rash    Has patient had a PCN reaction causing immediate rash, facial/tongue/throat swelling, SOB or lightheadedness with hypotension: Yes Has patient had a PCN reaction causing severe rash involving mucus membranes or skin necrosis:No Has patient had a PCN reaction that required hospitalization: Yes Has patient had a PCN reaction occurring within the last 10 years: No If all of the above answers are "NO", then may proceed with Cephalosporin use.        Objective:  BP 128/85   Pulse 73   Wt 195 lb (88.5 kg)   BMI 31.47 kg/m  Gen:  alert, not ill-appearing, no distress, appropriate for age 38: head normocephalic without obvious abnormality, conjunctiva and cornea clear, wearing glasses, trachea midline Pulm: Normal work of breathing, normal phonation, clear to auscultation bilaterally, no wheezes, rales or rhonchi CV: Normal rate, regular rhythm, s1 and s2 distinct, no murmurs, clicks or rubs  Neuro: alert and oriented x 3, no tremor MSK: extremities atraumatic, normal gait and station Skin: intact, no rashes on exposed skin, no jaundice, no cyanosis  Lab Results  Component Value Date   CREATININE 0.77 04/26/2018   BUN 22 04/26/2018   NA 142 04/26/2018   K 4.3 04/26/2018   CL 103 04/26/2018   CO2 23 04/26/2018     No results found for this or any previous visit (from the past 72 hour(s)). No results found.    Assessment and Plan: 62 y.o. female with   .[name] was seen today for blood pressure check.  Diagnoses and all orders for this  visit:  Postmenopausal atrophic vaginitis -     Prasterone (INTRAROSA) 6.5 MG INST; Place 1 application vaginally at bedtime.  Essential hypertension with goal blood pressure less than 140/90 -     Renal Profile with Estimated GFR   HTN BP in range Cont  current meds Cont baby asa for primary prevention Checking renal function and electrolytes She has greatly reduced alcohol consumption. I advised her of increased risk of electrolyte disturbance with Chlorthalidone and alcohol use. She will let me know if her alcohol consumption increases.   Patient education and anticipatory guidance given Patient agrees with treatment plan Follow-up in 6 months or sooner as needed if symptoms worsen or fail to improve  Darlyne Russian PA-C

## 2018-12-01 ENCOUNTER — Other Ambulatory Visit: Payer: Self-pay | Admitting: Physician Assistant

## 2018-12-01 LAB — RENAL PROFILE WITH ESTIMATED GFR
Albumin: 4.3 g/dL (ref 3.6–5.1)
BUN / CREAT RATIO: 22 (calc) (ref 6–22)
BUN: 34 mg/dL — ABNORMAL HIGH (ref 7–25)
CALCIUM: 9.6 mg/dL (ref 8.6–10.4)
CHLORIDE: 102 mmol/L (ref 98–110)
CO2: 27 mmol/L (ref 20–32)
Creat: 1.55 mg/dL — ABNORMAL HIGH (ref 0.50–0.99)
GFR, Est African American: 41 mL/min/{1.73_m2} — ABNORMAL LOW (ref >=60)
GFR, Est Non African American: 36 mL/min/{1.73_m2} — ABNORMAL LOW (ref >=60)
Glucose, Bld: 93 mg/dL (ref 65–99)
Phosphorus: 4.2 mg/dL (ref 2.5–4.5)
Potassium: 5 mmol/L (ref 3.5–5.3)
SODIUM: 138 mmol/L (ref 135–146)

## 2018-12-01 NOTE — Addendum Note (Signed)
Addended by: Nelson Chimes E on: 12/01/2018 03:24 PM   Modules accepted: Orders

## 2018-12-05 ENCOUNTER — Encounter: Payer: Self-pay | Admitting: Physician Assistant

## 2018-12-05 MED FILL — TEMAZEPAM 15 MG CAPSULE: 15 | 30 days supply | Qty: 60 | Fill #0

## 2018-12-06 ENCOUNTER — Telehealth: Payer: No Typology Code available for payment source | Admitting: Physician Assistant

## 2018-12-06 DIAGNOSIS — J069 Acute upper respiratory infection, unspecified: Secondary | ICD-10-CM | POA: Diagnosis not present

## 2018-12-06 MED ORDER — IPRATROPIUM BROMIDE 0.06 % NA SOLN: 2.0000 | Freq: Four times a day (QID) | NASAL | 12 refills | Status: DC

## 2018-12-06 MED ORDER — BENZONATATE 100 MG PO CAPS: 100.0000 mg | ORAL_CAPSULE | Freq: Three times a day (TID) | ORAL | 0 refills | Status: DC

## 2018-12-06 NOTE — Progress Notes (Signed)
We are sorry you are not feeling well.  Here is how we plan to help!  Based on what you have shared with me, it looks like you may have a viral upper respiratory infection or a "common cold".  Colds are caused by a large number of viruses; however, rhinovirus is the most common cause.   Symptoms of the common cold vary from person to person, with common symptoms including sore throat, cough, and malaise.  A low-grade fever of 100.4 may present, but is often uncommon.  Symptoms vary however, and are closely related to a person's age or underlying illnesses.  The most common symptoms associated with the common cold are nasal discharge or congestion, cough, sneezing, headache and pressure in the ears and face.  Cold symptoms usually persist for about 3 to 10 days, but can last up to 2 weeks.  It is important to know that colds do not cause serious illness or complications in most cases.    The common cold is transmitted from person to person, with the most common method of transmission being a person's hands.  The virus is able to live on the skin and can infect other persons for up to 2 hours after direct contact.  Also, colds are transmitted when someone coughs or sneezes; thus, it is important to cover the mouth to reduce this risk.  To keep the spread of the common cold at [place], good hand hygiene is very important.  This is an infection that is most likely caused by a virus. There are no specific treatments for the common cold other than to help you with the symptoms until the infection runs its course.    For nasal congestion, you may use an oral decongestants such as Mucinex D or if you have glaucoma or high blood pressure use plain Mucinex.  Saline nasal spray or nasal drops can help and can safely be used as often as needed for congestion.  For your congestion, I have prescribed Ipratropium Bromide nasal spray 0.03% two sprays in each nostril 2-3 times a day  If you do not have a history of heart  disease, hypertension, diabetes or thyroid disease, prostate/bladder issues or glaucoma, you may also use Sudafed to treat nasal congestion.  It is highly recommended that you consult with a pharmacist or your primary care physician to ensure this medication is safe for you to take.     If you have a cough, you may use cough suppressants such as Delsym and Robitussin.  If you have glaucoma or high blood pressure, you can also use Coricidin HBP.   For cough I have prescribed for you A prescription cough medication called Tessalon Perles 100 mg. You may take 1-2 capsules every 8 hours as needed for cough  If you have a sore or scratchy throat, use a saltwater gargle-  to  teaspoon of salt dissolved in a 4-ounce to 8-ounce glass of warm water.  Gargle the solution for approximately 15-30 seconds and then spit.  It is important not to swallow the solution.  You can also use throat lozenges/cough drops and Chloraseptic spray to help with throat pain or discomfort.  Warm or cold liquids can also be helpful in relieving throat pain.  For headache, pain or general discomfort, you can use Ibuprofen or Tylenol as directed.   Some authorities believe that zinc sprays or the use of Echinacea may shorten the course of your symptoms.   HOME CARE . Only take medications as  instructed by your medical team. . Be sure to drink plenty of fluids. Water is fine as well as fruit juices, sodas and electrolyte beverages. You may want to stay away from caffeine or alcohol. If you are nauseated, try taking small sips of liquids. How do you know if you are getting enough fluid? Your urine should be a pale yellow or almost colorless. . Get rest. . Taking a steamy shower or using a humidifier may help nasal congestion and ease sore throat pain. You can place a towel over your head and breathe in the steam from hot water coming from a faucet. . Using a saline nasal spray works much the same way. . Cough drops, hard candies and  sore throat lozenges may ease your cough. . Avoid close contacts especially the very young and the elderly . Cover your mouth if you cough or sneeze . Always remember to wash your hands.   GET HELP RIGHT AWAY IF: . You develop worsening fever. . If your symptoms do not improve within 10 days . You develop yellow or green discharge from your nose over 3 days. . You have coughing fits . You develop a severe head ache or visual changes. . You develop shortness of breath, difficulty breathing or start having chest pain . Your symptoms persist after you have completed your treatment plan  MAKE SURE YOU   Understand these instructions.  Will watch your condition.  Will get help right away if you are not doing well or get worse.  Your e-visit answers were reviewed by a board certified advanced clinical practitioner to complete your personal care plan. Depending upon the condition, your plan could have included both over the counter or prescription medications. Please review your pharmacy choice. If there is a problem, you may call our nursing hot line at and have the prescription routed to another pharmacy. Your safety is important to [place]. If you have drug allergies check your prescription carefully.   You can use MyChart to ask questions about today's visit, request a non-urgent call back, or ask for a work or school excuse for 24 hours related to this e-Visit. If it has been greater than 24 hours you will need to follow up with your provider, or enter a new e-Visit to address those concerns. You will get an e-mail in the next two days asking about your experience.  I hope that your e-visit has been valuable and will speed your recovery. Thank you for using e-visits.

## 2018-12-13 LAB — BASIC METABOLIC PANEL WITH GFR
BUN/Creatinine Ratio: 25 (calc) — ABNORMAL HIGH (ref 6–22)
BUN: 34 mg/dL — ABNORMAL HIGH (ref 7–25)
CHLORIDE: 96 mmol/L — AB (ref 98–110)
CO2: 26 mmol/L (ref 20–32)
CREATININE: 1.38 mg/dL — AB (ref 0.50–0.99)
Calcium: 9.5 mg/dL (ref 8.6–10.4)
GFR, Est African American: 48 mL/min/{1.73_m2} — ABNORMAL LOW (ref >=60)
GFR, Est Non African American: 41 mL/min/{1.73_m2} — ABNORMAL LOW (ref >=60)
Glucose, Bld: 104 mg/dL — ABNORMAL HIGH (ref 65–99)
POTASSIUM: 2.8 mmol/L — AB (ref 3.5–5.3)
SODIUM: 137 mmol/L (ref 135–146)

## 2018-12-13 MED ORDER — METOPROLOL SUCCINATE ER 100 MG PO TB24: 100.0000 mg | ORAL_TABLET | Freq: Every day | ORAL | 1 refills | Status: DC

## 2018-12-13 MED ORDER — AMLODIPINE BESYLATE 5 MG PO TABS: 5.0000 mg | ORAL_TABLET | Freq: Every day | ORAL | 1 refills | Status: DC

## 2018-12-13 MED ORDER — POTASSIUM CHLORIDE CRYS ER 20 MEQ PO TBCR: 20.0000 meq | EXTENDED_RELEASE_TABLET | Freq: Every day | ORAL | 1 refills | Status: DC

## 2018-12-13 NOTE — Progress Notes (Signed)
Hi [name],  Your kidney function is a little better, but not where I would like it to be.  Your potassium has dropped significantly. Are you certain you are only taking 12.5 mg of Chlorthalidone? Are you drinking any alcohol?  I want you to increase your Metoprolol to 100 mg daily, cont Chlorthalidone 12.5 mg and start Amlodipine 5 mg. Also start Potassium 20 meq daily. Come back Friday morning to recheck your kidney function and potassium.  New prescriptions have been sent. Let me know if you have any questions regarding the plan.

## 2018-12-13 NOTE — Addendum Note (Signed)
Addended by: Nelson Chimes E on: 12/13/2018 12:13 PM   Modules accepted: Orders

## 2018-12-16 MED FILL — OMEPRAZOLE 20 MG CPDR: 20 | 90 days supply | Qty: 90 | Fill #1

## 2018-12-20 LAB — RENAL PROFILE WITH ESTIMATED GFR
Albumin: 4.1 g/dL (ref 3.6–5.1)
BUN / CREAT RATIO: 27 (calc) — AB (ref 6–22)
BUN: 27 mg/dL — ABNORMAL HIGH (ref 7–25)
CALCIUM: 9.6 mg/dL (ref 8.6–10.4)
CHLORIDE: 100 mmol/L (ref 98–110)
CO2: 30 mmol/L (ref 20–32)
Creat: 1.01 mg/dL — ABNORMAL HIGH (ref 0.50–0.99)
GFR, Est African American: 70 mL/min/{1.73_m2} (ref >=60)
GFR, Est Non African American: 60 mL/min/{1.73_m2} (ref >=60)
GLUCOSE: 90 mg/dL (ref 65–99)
PHOSPHORUS: 3.7 mg/dL (ref 2.5–4.5)
POTASSIUM: 4.2 mmol/L (ref 3.5–5.3)
SODIUM: 137 mmol/L (ref 135–146)

## 2019-01-02 MED FILL — ARIPiprazole 5 MG TABS: 5 | 30 days supply | Qty: 30 | Fill #1

## 2019-01-05 MED FILL — TEMAZEPAM 15 MG CAPSULE: 15 | 30 days supply | Qty: 60 | Fill #1

## 2019-01-11 ENCOUNTER — Ambulatory Visit (HOSPITAL_COMMUNITY): Payer: No Typology Code available for payment source | Admitting: Psychiatry

## 2019-01-11 ENCOUNTER — Encounter (HOSPITAL_COMMUNITY): Payer: Self-pay | Admitting: Psychiatry

## 2019-01-11 DIAGNOSIS — F3341 Major depressive disorder, recurrent, in partial remission: Secondary | ICD-10-CM

## 2019-01-11 DIAGNOSIS — F418 Other specified anxiety disorders: Secondary | ICD-10-CM

## 2019-01-11 MED ORDER — GABAPENTIN 300 MG PO CAPS: ORAL_CAPSULE | ORAL | 2 refills | Status: DC

## 2019-01-11 MED ORDER — ARIPIPRAZOLE 5 MG PO TABS: 5.0000 mg | ORAL_TABLET | Freq: Every day | ORAL | 5 refills | Status: DC

## 2019-01-11 MED ORDER — TEMAZEPAM 15 MG PO CAPS: ORAL_CAPSULE | ORAL | 4 refills | Status: DC

## 2019-01-11 MED ORDER — BUPROPION HCL ER (XL) 300 MG PO TB24: 300.0000 mg | ORAL_TABLET | Freq: Every day | ORAL | 1 refills | Status: DC

## 2019-01-11 MED FILL — buPROPion HCL ER (XL) 300 M: 300 | 90 days supply | Qty: 90 | Fill #0

## 2019-01-11 MED FILL — GABAPENTIN 300 MG CAPSULE: 300 | 30 days supply | Qty: 90 | Fill #0
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
## 2019-01-13 MED FILL — AMLODIPINE BESYLATE 5 MG TA: 5 | 30 days supply | Qty: 30 | Fill #1

## 2019-01-20 MED FILL — METOPROLOL SUCCINATE ER 100: 100 | 30 days supply | Qty: 30 | Fill #1

## 2019-01-25 ENCOUNTER — Ambulatory Visit (INDEPENDENT_AMBULATORY_CARE_PROVIDER_SITE_OTHER): Payer: No Typology Code available for payment source | Admitting: Licensed Clinical Social Worker

## 2019-01-25 ENCOUNTER — Encounter (HOSPITAL_COMMUNITY): Payer: Self-pay | Admitting: Licensed Clinical Social Worker

## 2019-01-25 DIAGNOSIS — F418 Other specified anxiety disorders: Secondary | ICD-10-CM
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
## 2019-02-03 MED FILL — ARIPiprazole 5 MG TABS: 5 | 30 days supply | Qty: 30 | Fill #2 | Status: TO

## 2019-02-08 ENCOUNTER — Ambulatory Visit (INDEPENDENT_AMBULATORY_CARE_PROVIDER_SITE_OTHER): Payer: No Typology Code available for payment source

## 2019-02-08 ENCOUNTER — Encounter: Payer: Self-pay | Admitting: Physician Assistant

## 2019-02-08 ENCOUNTER — Ambulatory Visit (INDEPENDENT_AMBULATORY_CARE_PROVIDER_SITE_OTHER): Payer: No Typology Code available for payment source | Admitting: Physician Assistant

## 2019-02-08 VITALS — BP 136/85 | HR 69 | Wt 208.0 lb

## 2019-02-08 DIAGNOSIS — M25562 Pain in left knee: Secondary | ICD-10-CM | POA: Diagnosis not present

## 2019-02-08 DIAGNOSIS — I10 Essential (primary) hypertension: Secondary | ICD-10-CM | POA: Diagnosis not present

## 2019-02-08 MED ORDER — DICLOFENAC SODIUM 1 % TD GEL: 4.0000 g | Freq: Four times a day (QID) | TRANSDERMAL | 3 refills | Status: DC

## 2019-02-08 MED ORDER — NEBIVOLOL HCL 20 MG PO TABS: 20.0000 mg | ORAL_TABLET | Freq: Every day | ORAL | 1 refills | Status: DC

## 2019-02-08 MED FILL — DICLOFENAC SODIUM 1 % GEL: 1 | 6 days supply | Qty: 100 | Fill #0

## 2019-02-08 NOTE — Patient Instructions (Signed)
Acute Knee Pain, Adult  Acute knee pain is sudden and may be caused by damage, swelling, or irritation of the muscles and tissues that support your knee. The injury may result from:   A fall.   An injury to your knee from twisting motions.   A hit to the knee.   Infection.  Acute knee pain may go away on its own with time and rest. If it does not, your health care provider may order tests to find the cause of the pain. These may include:   Imaging tests, such as an X-ray, MRI, or ultrasound.   Joint aspiration. In this test, fluid is removed from the knee.   Arthroscopy. In this test, a lighted tube is inserted into the knee and an image is projected onto a TV screen.   Biopsy. In this test, a sample of tissue is removed from the body and studied under a microscope.  Follow these instructions at home:  Pay attention to any changes in your symptoms. Take these actions to relieve your pain.  If you have a knee sleeve or brace:     Wear the sleeve or brace as told by your health care provider. Remove it only as told by your health care provider.   Loosen the sleeve or brace if your toes tingle, become numb, or turn cold and blue.   Keep the sleeve or brace clean.   If the sleeve or brace is not waterproof:  ? Do not let it get wet.  ? Cover it with a watertight covering when you take a bath or shower.  Activity   Rest your knee.   Do not do things that cause pain or make pain worse.   Avoid high-impact activities or exercises, such as running, jumping rope, or doing jumping jacks.   Work with a physical therapist to make a safe exercise program, as recommended by your health care provider. Do exercises as told by your physical therapist.  Managing pain, stiffness, and swelling     If directed, put ice on the knee:  ? Put ice in a plastic bag.  ? Place a towel between your skin and the bag.  ? Leave the ice on for 20 minutes, 2-3 times a day.   If directed, use an elastic bandage to put pressure  (compression) on your injured knee. This may control swelling, give support, and help with discomfort.  General instructions   Take over-the-counter and prescription medicines only as told by your health care provider.   Raise (elevate) your knee above the level of your heart when you are sitting or lying down.   Sleep with a pillow under your knee.   Do not use any products that contain nicotine or tobacco, such as cigarettes, e-cigarettes, and chewing tobacco. These can delay healing. If you need help quitting, ask your health care provider.   If you are overweight, work with your health care provider and a dietitian to set a weight-loss goal that is healthy and reasonable for you. Extra weight can put pressure on your knee.   Keep all follow-up visits as told by your health care provider. This is important.  Contact a health care provider if:   Your knee pain continues, changes, or gets worse.   You have a fever along with knee pain.   Your knee feels warm to the touch.   Your knee buckles or locks up.  Get help right away if:   Your knee swells,   and the swelling becomes worse.   You cannot move your knee.   You have severe pain in your knee.  Summary   Acute knee pain can be caused by a fall, an injury, an infection, or damage, swelling, or irritation of the tissues that support your knee.   Your health care provider may perform tests to find out the cause of the pain.   Pay attention to any changes in your symptoms. Relieve your pain with rest, medicines, light activity, and use of ice.   Get help if your pain continues or becomes worse, your knee swells, or you cannot move your knee.  This information is not intended to replace advice given to you by your health care provider. Make sure you discuss any questions you have with your health care provider.  Document Released: 09/27/2007 Document Revised: 05/12/2018 Document Reviewed: 05/12/2018  Elsevier Interactive Patient Education  2019  [employer]

## 2019-02-08 NOTE — Progress Notes (Signed)
HPI:                                                                Mary Stark is a 63 y.o. female who presents to [facility]: [facility] today for left knee pain  Approx 1 month of left medial knee pain with mild swelling and stiffness. She states she had a near fall at work this weekend where it felt like knee gave out, which prompted her to schedule this visit today. No prior injury or trauma, remote or otherwise Taking Tylenol 1000 mg twice a day Avoiding NSAIDs due to acute kidney injury.  She also has noted increased bloating and gas since increasing her dose of Metoprolol. She would like to switch to a different beta blocker.  Past Medical History:  Diagnosis Date  . Alcohol dependence ([place])   . Depression 07/17/2013  . History of hepatitis C 07/17/2013  . History of rheumatic fever   . Hyperlipidemia 07/17/2013  . Hypertension   . Tobacco use   . Toe fracture, right 10/07/2016  . Vitamin D deficiency 07/17/2013   Past Surgical History:  Procedure Laterality Date  . CESAREAN SECTION  1984  . FINGER SURGERY Right    5th  . GYNECOLOGIC CRYOSURGERY    . TONSILLECTOMY     Social History   Tobacco Use  . Smoking status: Current Some Day Smoker    Packs/day: 1.00    Years: 25.00    Pack years: 25.00    Types: Cigarettes    Last attempt to quit: 12/17/2012    Years since quitting: 6.1  . Smokeless tobacco: Never Used  . Tobacco comment: <1 cig per month  Substance Use Topics  . Alcohol use: Yes    Alcohol/week: 24.0 - 36.0 standard drinks    Types: 24 - 36 Cans of beer per week   family history includes Cardiomyopathy in her father; Liver cancer in her brother; Ovarian cancer in her mother; Protein C deficiency in her brother and brother; Stroke in her brother; Valvular heart disease in her father.    ROS: negative except as noted in the HPI  Medications: Current Outpatient Medications  Medication Sig Dispense Refill  .  albuterol (PROVENTIL HFA;VENTOLIN HFA) 108 (90 Base) MCG/ACT inhaler Inhale 1-2 puffs into the lungs every 4 (four) hours as needed for wheezing or shortness of breath. 1 Inhaler 1  . amLODipine (NORVASC) 5 MG tablet Take 1 tablet (5 mg total) by mouth daily. 30 tablet 1  . ARIPiprazole (ABILIFY) 5 MG tablet Take 1 tablet (5 mg total) by mouth at bedtime. 30 tablet 5  . Ascorbic Acid (VITAMIN C PO) Take 1 tablet by mouth daily.    Marland Kitchen aspirin EC 81 MG tablet Take 1 tablet (81 mg total) by mouth daily. 90 tablet 3  . buPROPion (WELLBUTRIN XL) 300 MG 24 hr tablet Take 1 tablet (300 mg total) by mouth daily. 90 tablet 1  . CALCIUM PO Take 1 tablet by mouth daily.    . chlorthalidone (HYGROTON) 25 MG tablet Take 0.5 tablets (12.5 mg total) by mouth daily. Start 1/2 tab (12.5 mg) PO QD x 1 week. Titrate to goal BP <130/80. Max dose 50 mg QD 90 tablet 1  .  Coenzyme Q10 (CO Q 10 PO) Take by mouth.    . Cyanocobalamin (VITAMIN B 12 PO) Take 1 tablet by mouth daily.    . diclofenac sodium (VOLTAREN) 1 % GEL Apply 4 g topically 4 (four) times daily. To affected joint. 100 g 3  . gabapentin (NEURONTIN) 300 MG capsule 1  Bid  For  1week then 1 qam   2  qhs 90 capsule 2  . GLUCOSAMINE-CHONDROITIN PO Take 1 tablet by mouth daily.    Marland Kitchen ipratropium (ATROVENT) 0.06 % nasal spray Place 2 sprays into both nostrils 4 (four) times daily. 15 mL 12  . Magnesium Glycinate POWD 400 mg by Does not apply route daily.  0  . Nebivolol HCl 20 MG TABS Take 1 tablet (20 mg total) by mouth daily. 90 tablet 1  . Omega-3 Fatty Acids (FISH OIL PO) Take 1 tablet by mouth daily.    Marland Kitchen omeprazole (PRILOSEC) 20 MG capsule TAKE 1 CAPSULE (20 MG TOTAL) BY MOUTH DAILY. 90 capsule 1  . potassium chloride SA (K-DUR,KLOR-CON) 20 MEQ tablet Take 1 tablet (20 mEq total) by mouth daily. 30 tablet 1  . Prasterone (INTRAROSA) 6.5 MG INST Place 1 application vaginally at bedtime. 84 each 11  . simvastatin (ZOCOR) 20 MG tablet Take 1 tablet (20 mg  total) by mouth at bedtime. Due for labs 90 tablet 0  . temazepam (RESTORIL) 15 MG capsule 1  qhs  If fails in 4 nights go to 2 60 capsule 4  . VITAMIN E PO Take 1 tablet by mouth daily.     No current facility-administered medications for this visit.    Allergies  Allergen Reactions  . Metformin And Related Other (See Comments)    Lactic acidosis   . Penicillins Hives and Rash    Has patient had a PCN reaction causing immediate rash, facial/tongue/throat swelling, SOB or lightheadedness with hypotension: Yes Has patient had a PCN reaction causing severe rash involving mucus membranes or skin necrosis:No Has patient had a PCN reaction that required hospitalization: Yes Has patient had a PCN reaction occurring within the last 10 years: No If all of the above answers are "NO", then may proceed with Cephalosporin use.        Objective:  BP 136/85   Pulse 69   Wt 208 lb (94.3 kg)   BMI 33.57 kg/m  Gen:  alert, not ill-appearing, no distress, appropriate for age, obese female HEENT: head normocephalic without obvious abnormality, conjunctiva and cornea clear, wearing glasses, trachea midline Pulm: Normal work of breathing, normal phonation Neuro: alert and oriented x 3, no tremor MSK: extremities atraumatic, normal gait and station Left knee: atraumatic, no edema, there is medial joint line tenderness, tender over medial femoral condyle and distal quadriceps Skin: intact, no rashes on exposed skin, no jaundice, no cyanosis  Lab Results  Component Value Date   CREATININE 1.01 (H) 12/19/2018   BUN 27 (H) 12/19/2018   NA 137 12/19/2018   K 4.2 12/19/2018   CL 100 12/19/2018   CO2 30 12/19/2018   BP Readings from Last 3 Encounters:  02/08/19 136/85  11/30/18 128/85  11/02/18 (!) 168/98      No results found for this or any previous visit (from the past 76 hour(s)). No results found.    Assessment and Plan: 63 y.o. female with   .[name] was seen today for knee  pain.  Diagnoses and all orders for this visit:  Left medial knee pain -  DG Knee Complete 4 Views Left -     diclofenac sodium (VOLTAREN) 1 % GEL; Apply 4 g topically 4 (four) times daily. To affected joint.  Essential hypertension with goal blood pressure less than 130/80 -     Nebivolol HCl 20 MG TABS; Take 1 tablet (20 mg total) by mouth daily. -     chlorthalidone (HYGROTON) 25 MG tablet; Take 0.5 tablets (12.5 mg total) by mouth daily. Start 1/2 tab (12.5 mg) PO QD x 1 week. Titrate to goal BP <130/80. Max dose 50 mg QD   Left medial knee pain - personally reviewed X-ray showing medial joint space narrowing consistent with OA - provided with home rehab exercises for patellofemoral pain - cont to avoid oral NSAIDs due to recent AKI - diclofenac gel QID - follow-up with Sports Medicine in 2 weeks  HTN Switching from Metoprolol to Bystolic due to medication intolerance Cont Chlorthalidone 12.5 mg and Amlodipine 5 mg  Patient education and anticipatory guidance given Patient agrees with treatment plan Follow-up in 3 months for HTN or sooner as needed if symptoms worsen or fail to improve  Darlyne Russian PA-C

## 2019-02-09 ENCOUNTER — Ambulatory Visit (HOSPITAL_COMMUNITY): Payer: No Typology Code available for payment source | Admitting: Licensed Clinical Social Worker

## 2019-02-09 MED FILL — CHLORTHALIDONE 25 MG TABS: 25 | 45 days supply | Qty: 90 | Fill #1

## 2019-02-09 MED FILL — BYSTOLIC 20 MG TABLET: 20 | 90 days supply | Qty: 90 | Fill #0

## 2019-02-13 ENCOUNTER — Encounter: Payer: Self-pay | Admitting: Physician Assistant

## 2019-02-13 DIAGNOSIS — G8929 Other chronic pain: Secondary | ICD-10-CM | POA: Insufficient documentation

## 2019-02-13 DIAGNOSIS — M25562 Pain in left knee: Principal | ICD-10-CM

## 2019-02-13 DIAGNOSIS — I10 Essential (primary) hypertension: Secondary | ICD-10-CM | POA: Insufficient documentation

## 2019-02-13 MED ORDER — CHLORTHALIDONE 25 MG PO TABS: 12.5000 mg | ORAL_TABLET | Freq: Every day | ORAL | 1 refills | Status: DC

## 2019-02-17 ENCOUNTER — Other Ambulatory Visit: Payer: Self-pay | Admitting: Physician Assistant

## 2019-02-17 DIAGNOSIS — I10 Essential (primary) hypertension: Secondary | ICD-10-CM

## 2019-02-17 MED FILL — ASPIRIN ADULT LOW STRENGTH: 81 | 90 days supply | Qty: 90 | Fill #0

## 2019-02-17 MED FILL — SIMVASTATIN 20 MG TABLET: 20 | 90 days supply | Qty: 90 | Fill #0

## 2019-02-17 MED FILL — AMLODIPINE BESYLATE 5 MG TA: 5 | 60 days supply | Qty: 60 | Fill #0

## 2019-02-22 ENCOUNTER — Ambulatory Visit (HOSPITAL_COMMUNITY): Payer: No Typology Code available for payment source | Admitting: Licensed Clinical Social Worker

## 2019-02-27 ENCOUNTER — Ambulatory Visit (INDEPENDENT_AMBULATORY_CARE_PROVIDER_SITE_OTHER): Payer: No Typology Code available for payment source | Admitting: Family Medicine

## 2019-02-27 ENCOUNTER — Encounter: Payer: Self-pay | Admitting: Family Medicine

## 2019-02-27 ENCOUNTER — Other Ambulatory Visit: Payer: Self-pay

## 2019-02-27 VITALS — BP 145/72 | HR 81 | Temp 98.0°F | Wt 208.1 lb

## 2019-02-27 DIAGNOSIS — M25562 Pain in left knee: Secondary | ICD-10-CM | POA: Diagnosis not present

## 2019-02-27 NOTE — Patient Instructions (Signed)
Thank you for coming in today. Call or go to the ER if you develop a large red swollen joint with extreme pain or oozing puss.  Keep an eye on the knee.  If not better next step is MRI.  Let me know.   Take it easy today.    Meniscus Tear  A meniscus tear is a knee injury that happens when a piece of the meniscus is torn. The meniscus is a thick, rubbery, wedge-shaped cartilage in the knee. Two menisci are located in each knee. They sit between the upper bone (femur) and lower bone (tibia) that make up the knee joint. Each meniscus acts as a shock absorber for the knee. A torn meniscus is one of the most common types of knee injuries. This injury can range from mild to severe. Surgery may be needed to repair a severe tear. What are the causes? This condition may be caused by any kneeling, squatting, twisting, or pivoting movement. Sports-related injuries are the most common cause. These often occur from:  Running and stopping suddenly. ? Changing direction. ? Being tackled or knocked off your feet.  Lifting or carrying heavy weights. As people get older, their menisci get thinner and weaker. In these people, tears can happen more easily, such as from climbing stairs. What increases the risk? You are more likely to develop this condition if you:  Play contact sports.  Have a job that requires kneeling or squatting.  Are female.  Are over 5 years old. What are the signs or symptoms? Symptoms of this condition include:  Knee pain, especially at the side of the knee joint. You may feel pain when the injury occurs, or you may only hear a pop and feel pain later.  A feeling that your knee is clicking, catching, locking, or giving way.  Not being able to fully bend or extend your knee.  Bruising or swelling in your knee. How is this diagnosed? This condition may be diagnosed based on your symptoms and a physical exam. You may also have tests, such as:  X-rays.  MRI.  A  procedure to look inside your knee with a narrow surgical telescope (arthroscopy). You may be referred to a knee specialist (orthopedic surgeon). How is this treated? Treatment for this injury depends on the severity of the tear. Treatment for a mild tear may include:  Rest.  Medicine to reduce pain and swelling. This is usually a nonsteroidal anti-inflammatory drug (NSAID), like ibuprofen.  A knee brace, sleeve, or wrap.  Using crutches or a walker to keep weight off your knee and to help you walk.  Exercises to strengthen your knee (physical therapy). You may need surgery if you have a severe tear or if other treatments are not working. Follow these instructions at home: If you have a brace, sleeve, or wrap:  Wear it as told by your health care provider. Remove it only as told by your health care provider.  Loosen the brace, sleeve, or wrap if your toes tingle, become numb, or turn cold and blue.  Keep the brace, sleeve, or wrap clean and dry.  If the brace, sleeve, or wrap is not waterproof: ? Do not let it get wet. ? Cover it with a watertight covering when you take a bath or shower. Managing pain and swelling   Take over-the-counter and prescription medicines only as told by your health care provider.  If directed, put ice on your knee: ? If you have a removable brace, sleeve,  or wrap, remove it as told by your health care provider. ? Put ice in a plastic bag. ? Place a towel between your skin and the bag. ? Leave the ice on for 20 minutes, 2-3 times per day.  Move your toes often to avoid stiffness and to lessen swelling.  Raise (elevate) the injured area above the level of your heart while you are sitting or lying down. Activity  Do not use the injured limb to support your body weight until your health care provider says that you can. Use crutches or a walker as told by your health care provider.  Return to your normal activities as told by your health care  provider. Ask your health care provider what activities are safe for you.  Perform range-of-motion exercises only as told by your health care provider.  Begin doing exercises to strengthen your knee and leg muscles only as told by your health care provider. After you recover, your health care provider may recommend these exercises to help prevent another injury. General instructions  Use a knee brace, sleeve, or wrap as told by your health care provider.  Ask your health care provider when it is safe to drive if you have a brace, sleeve, or wrap on your knee.  Do not use any products that contain nicotine or tobacco, such as cigarettes, e-cigarettes, and chewing tobacco. If you need help quitting, ask your health care provider.  Ask your health care provider if the medicine prescribed to you: ? Requires you to avoid driving or using heavy machinery. ? Can cause constipation. You may need to take these actions to prevent or treat constipation:  Drink enough fluid to keep your urine pale yellow.  Take over-the-counter or prescription medicines.  Eat foods that are high in fiber, such as beans, whole grains, and fresh fruits and vegetables.  Limit foods that are high in fat and processed sugars, such as fried or sweet foods.  Keep all follow-up visits as told by your health care provider. This is important. Contact a health care provider if:  You have a fever.  Your knee becomes red, tender, or swollen.  Your pain medicine is not helping.  Your symptoms get worse or do not improve after 2 weeks of home care. Summary  A meniscus tear is a knee injury that happens when a piece of the meniscus is torn.  Treatment for this injury depends on the severity of the tear. You may need surgery if you have a severe tear or if other treatments are not working.  Rest, ice, and raise (elevate) your injured knee as told by your health care provider. This will help lessen pain and swelling.   Contact a health care provider if you have new symptoms, or your symptoms get worse or do not improve after 2 weeks of home care.  Keep all follow-up visits as told by your health care provider. This is important. This information is not intended to replace advice given to you by your health care provider. Make sure you discuss any questions you have with your health care provider. Document Released: 02/20/2003 Document Revised: 06/14/2018 Document Reviewed: 06/14/2018 Elsevier Interactive Patient Education  2019 [employer].

## 2019-02-27 NOTE — Progress Notes (Signed)
Subjective:    I'm seeing this patient as a consultation for:  Trixie Dredge, PA-C  CC: Left knee pain  HPI: Mary Stark is a 63 y.o. female who presents to [facility] today for left knee pain.  Patient had a near fall twisting knee injury occurring in late January 2020.  She was seen by her primary care provider Sherlie Ban on February 26.  During that visit x-rays showed only mild degenerative changes at the medial compartment.  Additionally she was thought to have a strain or sprain and given a knee brace and diclofenac gel.   Patient reports pain with walking improved with wearing her knee brace, resting, Tylenol, and cold compresses. Pain wakes the patient at night. Patient lost stability and almost fell 4 weeks ago. Pain is progressively worsening.  She denies significant locking or catching but does note popping and clicking.  She notes symptoms are quite obnoxious and are interfering with work and at home.  Past medical history, Surgical history, Family history not pertinant except as noted below, Social history, Allergies, and medications have been entered into the medical record, reviewed, and no changes needed.   Review of Systems: No headache, visual changes, nausea, vomiting, diarrhea, constipation, dizziness, abdominal pain, skin rash, fevers, chills, night sweats, weight loss, swollen lymph nodes, body aches, joint swelling, muscle aches, chest pain, shortness of breath, mood changes, visual or auditory hallucinations.   Objective:    Vitals:   02/27/19 1312  BP: (!) 145/72  Pulse: 81  Temp: 98 F (36.7 C)   General: Well Developed, well nourished, and in no acute distress.  Neuro/Psych: Alert and oriented x3, extra-ocular muscles intact, able to move all 4 extremities, sensation grossly intact. Skin: Warm and dry, no rashes noted.  Respiratory: Not using accessory muscles, speaking in full sentences,  trachea midline.  Cardiovascular: Pulses palpable, no extremity edema. Abdomen: Does not appear distended. MSK: L-Knee: Normal appearance, minimal effusion no deformity. TTP at medial joint line,  normal ROM, Pain with extension,  positive medial Mcmurray's test,  negative anterior drawer, negative posterior drawer, negative valgus, negative varus. Intact strength. Mild antalgic gait.   Lab and Radiology Results EXAM: LEFT KNEE - COMPLETE 4+ VIEW  COMPARISON:  No recent.  FINDINGS: No acute bony or joint abnormality identified. No evidence of fracture or dislocation.  IMPRESSION: No acute abnormality.   Electronically Signed   By: Marcello Moores  Register   On: 02/09/2019 09:41  I personally (independently) visualized and performed the interpretation of the images attached in this note.   Procedure: Real-time Ultrasound Guided Injection of left knee Device: GE Logiq E   Images permanently stored and available for review in the ultrasound unit. Verbal informed consent obtained.  Discussed risks and benefits of procedure. Warned about infection bleeding damage to structures skin hypopigmentation and fat atrophy among others. Patient expresses understanding and agreement Time-out conducted.   Noted no overlying erythema, induration, or other signs of local infection.   Skin prepped in a sterile fashion.   Local anesthesia: Topical Ethyl chloride.   With sterile technique and under real time ultrasound guidance:  80 mg of Kenalog and 4 mL of Marcaine injected easily.   Completed without difficulty   Pain immediately resolved suggesting accurate placement of the medication.   Advised to call if fevers/chills, erythema, induration, drainage, or persistent bleeding.   Images permanently stored and available for review in the ultrasound unit.  Impression: Technically successful ultrasound  guided injection.       Impression and Recommendations:    Assessment and Plan:  63 y.o. female with left knee pain with walking, positive Mcmurray's test, and X-ray showing only minor OA on medial knee joint suggests medial meniscal tear.  She does have some mild mechanical symptoms.  Performed Steroid injection.  If not better neck step would be MRI.  Patient will call back to request MRI for surgical planning.  PDMP not reviewed this encounter. No orders of the defined types were placed in this encounter.  No orders of the defined types were placed in this encounter.   Discussed warning signs or symptoms. Please see discharge instructions. Patient expresses understanding.  I personally was present and performed or re-performed the history, physical exam and medical decision-making activities of this service and have verified that the service and findings are accurately documented in the student's note. ___________________________________________ Lynne Leader M.D., ABFM., CAQSM. Primary Care and Sports Medicine Adjunct Instructor of [employer]

## 2019-02-28 ENCOUNTER — Institutional Professional Consult (permissible substitution): Payer: Self-pay | Admitting: Family Medicine

## 2019-02-28 MED FILL — SAXENDA 18 MG/3 ML PEN: 18 | 30 days supply | Qty: 15 | Fill #0

## 2019-03-01 ENCOUNTER — Telehealth: Payer: Self-pay | Admitting: Physician Assistant

## 2019-03-01 ENCOUNTER — Ambulatory Visit (HOSPITAL_COMMUNITY): Payer: No Typology Code available for payment source | Admitting: Licensed Clinical Social Worker

## 2019-03-01 ENCOUNTER — Telehealth: Payer: No Typology Code available for payment source | Admitting: Family

## 2019-03-01 DIAGNOSIS — R062 Wheezing: Secondary | ICD-10-CM

## 2019-03-01 DIAGNOSIS — R0602 Shortness of breath: Secondary | ICD-10-CM

## 2019-03-01 NOTE — Telephone Encounter (Signed)
Please call Mary Stark and triage her for wheezing/DOE. She made a MyChart appt for 3/24

## 2019-03-01 NOTE — Progress Notes (Signed)
Based on what you shared with me, I feel your condition warrants further evaluation and I recommend that you be seen for a face to face office visit.   Given your shortness of breath, I believe it would be best to be evaluated face to face.   I do not see asthma on patient's problem list and given symptoms and other chronic problems she would best treated face to face for this condition.   Approximately 5 minutes was spent documenting and reviewing patient's chart.     NOTE: If you entered your credit card information for this eVisit, you will not be charged. You may see a "hold" on your card for the $35 but that hold will drop off and you will not have a charge processed.  If you are having a true medical emergency please call 911.  If you need an urgent face to face visit, [place] has four urgent care centers for your convenience.  If you need care fast and have a high deductible or no insurance consider:   [email or web address] to reserve your spot online an avoid wait times  [facility] [address] [place], [place] [address] 8 am to 8 pm Monday-Friday 10 am to 4 pm Saturday-Sunday *Across the street from [employer]  [place], [address] 8 am to 5 pm Monday-Friday * In the [facility] on the [facility]   The following sites will take your insurance:  . [facility] [place] a Provider at this Location  [address] [place], [place] [address] . 10 am to 8 pm Monday-Friday . 12 pm to 8 pm Saturday-Sunday   . [facility] at [place] a Provider at this Location  [place], [place], [place] [address] . 8 am to 8 pm Monday-Friday . 9 am to 6 pm Saturday . 11 am to 6 pm Sunday   . [facility] at [place] Get  Driving Directions  [address] [place], [place] [address] . 8 am to 8 pm Monday-Friday . 8 am to 4 pm Saturday-Sunday   Your e-visit answers were reviewed by a board certified advanced clinical practitioner to complete your personal care plan.  Thank you for using e-Visits.

## 2019-03-02 ENCOUNTER — Other Ambulatory Visit: Payer: Self-pay | Admitting: Physician Assistant

## 2019-03-02 DIAGNOSIS — J22 Unspecified acute lower respiratory infection: Secondary | ICD-10-CM

## 2019-03-02 MED FILL — GABAPENTIN 300 MG CAPSULE: 300 | 30 days supply | Qty: 90 | Fill #1

## 2019-03-02 MED FILL — PROAIR HFA 90 MCG INHALER: 108 (90 BAS | 17 days supply | Qty: 9 | Fill #0

## 2019-03-02 NOTE — Telephone Encounter (Signed)
Left message for a return call

## 2019-03-02 NOTE — Telephone Encounter (Signed)
Patient called back. The only thing she was wanting was a refill on her inhaler. I advised her she has one at the pharmacy. She is just having her normal allergy symptoms. She has cancelled her appointment.

## 2019-03-07 ENCOUNTER — Ambulatory Visit: Payer: No Typology Code available for payment source | Admitting: Physician Assistant

## 2019-03-07 MED FILL — ARIPiprazole 5 MG TABS: 5 | 30 days supply | Qty: 30 | Fill #0

## 2019-03-09 ENCOUNTER — Ambulatory Visit: Payer: Self-pay | Admitting: Physician Assistant

## 2019-03-21 ENCOUNTER — Telehealth (INDEPENDENT_AMBULATORY_CARE_PROVIDER_SITE_OTHER): Payer: No Typology Code available for payment source | Admitting: Physician Assistant

## 2019-03-21 ENCOUNTER — Encounter: Payer: Self-pay | Admitting: Physician Assistant

## 2019-03-21 ENCOUNTER — Other Ambulatory Visit: Payer: Self-pay

## 2019-03-21 VITALS — BP 147/92 | HR 91 | Wt 200.0 lb

## 2019-03-21 DIAGNOSIS — Z791 Long term (current) use of non-steroidal anti-inflammatories (NSAID): Secondary | ICD-10-CM

## 2019-03-21 DIAGNOSIS — M1712 Unilateral primary osteoarthritis, left knee: Secondary | ICD-10-CM

## 2019-03-21 DIAGNOSIS — I10 Essential (primary) hypertension: Secondary | ICD-10-CM

## 2019-03-21 DIAGNOSIS — F418 Other specified anxiety disorders: Secondary | ICD-10-CM

## 2019-03-21 DIAGNOSIS — Z79899 Other long term (current) drug therapy: Secondary | ICD-10-CM | POA: Diagnosis not present

## 2019-03-21 DIAGNOSIS — E876 Hypokalemia: Secondary | ICD-10-CM | POA: Insufficient documentation

## 2019-03-21 DIAGNOSIS — N952 Postmenopausal atrophic vaginitis: Secondary | ICD-10-CM

## 2019-03-21 MED ORDER — OMEPRAZOLE 20 MG PO CPDR: 20.0000 mg | DELAYED_RELEASE_CAPSULE | Freq: Every day | ORAL | 1 refills | Status: DC

## 2019-03-21 MED ORDER — AMLODIPINE BESYLATE 5 MG PO TABS: 5.0000 mg | ORAL_TABLET | Freq: Every day | ORAL | 1 refills | Status: DC

## 2019-03-21 MED ORDER — DICLOFENAC SODIUM 1 % TD GEL: 4.0000 g | Freq: Four times a day (QID) | TRANSDERMAL | 1 refills | Status: DC

## 2019-03-21 MED ORDER — HYDROCHLOROTHIAZIDE 12.5 MG PO TABS: 12.5000 mg | ORAL_TABLET | Freq: Every day | ORAL | 1 refills | Status: DC

## 2019-03-21 MED ORDER — POTASSIUM CHLORIDE CRYS ER 20 MEQ PO TBCR: 20.0000 meq | EXTENDED_RELEASE_TABLET | Freq: Every day | ORAL | 1 refills | Status: DC

## 2019-03-21 MED ORDER — PRASTERONE 6.5 MG VA INST: 1.0000 "application " | VAGINAL_INSERT | VAGINAL | 11 refills | Status: DC

## 2019-03-21 MED FILL — HYDROCHLOROTHIAZIDE 12.5 MG: 12.5 | 90 days supply | Qty: 90 | Fill #0

## 2019-03-21 MED FILL — OMEPRAZOLE 20 MG CPDR: 20 | 90 days supply | Qty: 90 | Fill #0

## 2019-03-21 MED FILL — POTASSIUM CHLORIDE CRYS ER: 20 | 90 days supply | Qty: 90 | Fill #0

## 2019-03-21 MED FILL — DICLOFENAC SODIUM 1 % GEL: 1 | 12 days supply | Qty: 200 | Fill #0

## 2019-03-21 NOTE — Progress Notes (Signed)
Virtual Visit via Telephone Note  I connected with Mary Stark on 03/21/19 at  1:40 PM EDT by telephone and verified that I am speaking with the correct person using two identifiers.   I discussed the limitations, risks, security and privacy concerns of performing an evaluation and management service by telephone and the availability of in person appointments. I also discussed with the patient that there may be a patient responsible charge related to this service. The patient expressed understanding and agreed to proceed.   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 She is scared that either of them could die if they became infected. She is requesting a letter for her manager that will excuse her from working with [place] and high risk patients. She is also requesting something for anxiety. She is currently taking Temazepam 15 mg at bedtime prescribed by her psychiatrist (Dr. Casimiro Needle). Last alcoholic beverage was 3 weeks ago.  OA: received left knee injection on 02/27/19. Reports it has begun to wear off. Requesting refill of Diclofenac gel, which is only mildly helpful. Taking Extra Strength Tylenol 1000 mg 1-2 times daily  HTN: taking Bystolic, Chlorthalidone and Amlodipine daily. She reports Chlorthalidone tabs are too small to cut in half so she has been taking a whole tablet. She also discontinued her potassium supplement. Compliant with medications. BP is out of range today, but she reports she has not taken her medication yet. Denies vision change, headache, chest pain with exertion, orthopnea, lightheadedness, syncope and edema. Risk factors include: postmenopausal, HLD, obesity    Past Medical History:  Diagnosis Date  . Alcohol dependence ([place])   . Depression 07/17/2013  . History of hepatitis C 07/17/2013  . History of rheumatic fever   .  Hyperlipidemia 07/17/2013  . Hypertension   . Tobacco use   . Toe fracture, right 10/07/2016  . Vitamin D deficiency 07/17/2013   Past Surgical History:  Procedure Laterality Date  . CESAREAN SECTION  1984  . FINGER SURGERY Right    5th  . GYNECOLOGIC CRYOSURGERY    . TONSILLECTOMY     Social History   Tobacco Use  . Smoking status: Former Smoker    Packs/day: 1.00    Years: 25.00    Pack years: 25.00    Types: Cigarettes    Last attempt to quit: 2012    Years since quitting: 8.2  . Smokeless tobacco: Current User  . Tobacco comment: <1 cig per month  Substance Use Topics  . Alcohol use: Yes    Alcohol/week: 24.0 - 36.0 standard drinks    Types: 24 - 36 Cans of beer per week   family history includes Cardiomyopathy in her father; Liver cancer in her brother; Ovarian cancer in her mother; Protein C deficiency in her brother and brother; Stroke in her brother; Valvular heart disease in her father.    ROS: negative except as noted in the HPI  Medications: Current Outpatient Medications  Medication Sig Dispense Refill  . amLODipine (NORVASC) 5 MG tablet TAKE 1 TABLET (5 MG TOTAL) BY MOUTH DAILY. 30 tablet 1  . ARIPiprazole (ABILIFY) 5 MG tablet Take 1 tablet (5 mg total) by mouth at bedtime. 30 tablet 5  . Ascorbic Acid (VITAMIN C PO) Take 1 tablet by mouth daily.    . ASPIRIN ADULT LOW STRENGTH 81 MG EC tablet TAKE 1 TABLET (  81 MG TOTAL) BY MOUTH DAILY. 90 tablet 3  . buPROPion (WELLBUTRIN XL) 300 MG 24 hr tablet Take 1 tablet (300 mg total) by mouth daily. 90 tablet 1  . CALCIUM PO Take 1 tablet by mouth daily.    . chlorthalidone (HYGROTON) 25 MG tablet Take 0.5 tablets (12.5 mg total) by mouth daily. Start 1/2 tab (12.5 mg) PO QD x 1 week. Titrate to goal BP <130/80. Max dose 50 mg QD 90 tablet 1  . Coenzyme Q10 (CO Q 10 PO) Take by mouth.    . Cyanocobalamin (VITAMIN B 12 PO) Take 1 tablet by mouth daily.    . diclofenac sodium (VOLTAREN) 1 % GEL Apply 4 g topically 4  (four) times daily. To affected joint. 100 g 3  . gabapentin (NEURONTIN) 300 MG capsule 1  Bid  For  1week then 1 qam   2  qhs 90 capsule 2  . Nebivolol HCl 20 MG TABS Take 1 tablet (20 mg total) by mouth daily. 90 tablet 1  . Omega-3 Fatty Acids (FISH OIL PO) Take 1 tablet by mouth daily.    Marland Kitchen omeprazole (PRILOSEC) 20 MG capsule TAKE 1 CAPSULE (20 MG TOTAL) BY MOUTH DAILY. 90 capsule 1  . PROAIR HFA 108 (90 Base) MCG/ACT inhaler INHALE 1 TO 2 PUFFS BY MOUTH INTO THE LUNGS EVERY 4 HOURS AS NEEDED FOR WHEEZING OR SHORTNESS OF BREATH 8.5 g 1  . simvastatin (ZOCOR) 20 MG tablet TAKE 1 TABLET BY MOUTH AT BEDTIME. DUE FOR LABS 90 tablet 0  . temazepam (RESTORIL) 15 MG capsule 1  qhs  If fails in 4 nights go to 2 60 capsule 4  . VITAMIN E PO Take 1 tablet by mouth daily.    Marland Kitchen GLUCOSAMINE-CHONDROITIN PO Take 1 tablet by mouth daily.    Marland Kitchen ipratropium (ATROVENT) 0.06 % nasal spray Place 2 sprays into both nostrils 4 (four) times daily. (Patient not taking: Reported on 03/21/2019) 15 mL 12  . Magnesium Glycinate POWD 400 mg by Does not apply route daily. (Patient not taking: Reported on 03/21/2019)  0  . potassium chloride SA (K-DUR,KLOR-CON) 20 MEQ tablet Take 1 tablet (20 mEq total) by mouth daily. (Patient not taking: Reported on 03/21/2019) 30 tablet 1  . Prasterone (INTRAROSA) 6.5 MG INST Place 1 application vaginally at bedtime. 84 each 11   No current facility-administered medications for this visit.    Allergies  Allergen Reactions  . Metformin And Related Other (See Comments)    Lactic acidosis   . Penicillins Hives and Rash    Has patient had a PCN reaction causing immediate rash, facial/tongue/throat swelling, SOB or lightheadedness with hypotension: Yes Has patient had a PCN reaction causing severe rash involving mucus membranes or skin necrosis:No Has patient had a PCN reaction that required hospitalization: Yes Has patient had a PCN reaction occurring within the last 10 years: No If all of  the above answers are "NO", then may proceed with Cephalosporin use.        Objective:  BP (!) 147/92   Pulse 91 Comment: no meds taken today  Wt 200 lb (90.7 kg)   BMI 32.28 kg/m  HEENT: head normocephalic without obvious abnormality, conjunctiva and cornea clear, trachea midline Pulm: Normal work of breathing, normal phonation, clear to auscultation bilaterally, speaking in full sentences Psych: cooperative, anxious mood, she is tearful on the phone, speech is articulate, and thought processes clear and goal-directed    No results found for this or  any previous visit (from the past 72 hour(s)). No results found.    Assessment and Plan: 63 y.o. female with   .Diagnoses and all orders for this visit:  Encounter for long-term current use of medication  Depression with anxiety  NSAID long-term use -     omeprazole (PRILOSEC) 20 MG capsule; Take 1 capsule (20 mg total) by mouth daily.  Osteoarthritis of left knee, unspecified osteoarthritis type -     diclofenac sodium (VOLTAREN) 1 % GEL; Apply 4 g topically 4 (four) times daily. To affected joint.  Essential hypertension with goal blood pressure less than 140/90 -     Renal Profile with Estimated GFR -     amLODipine (NORVASC) 5 MG tablet; Take 1 tablet (5 mg total) by mouth daily. -     hydrochlorothiazide (HYDRODIURIL) 12.5 MG tablet; Take 1 tablet (12.5 mg total) by mouth daily.  Hypokalemia -     Renal Profile with Estimated GFR -     potassium chloride SA (K-DUR,KLOR-CON) 20 MEQ tablet; Take 1 tablet (20 mEq total) by mouth daily.  Postmenopausal atrophic vaginitis -     Prasterone (INTRAROSA) 6.5 MG INST; Place 1 application vaginally 2 (two) times a week.  Anxiety about health   HTN - switching from Chlorthalidone to HCTZ due to difficulty with cutting pills. Cont Bystolic and Amlodipine. Advised her to resume her potassium supplement and return this week or next for repeat BMP.  Anxiety state - work note  provided. Keep follow-up with Psychiatrist. Since she is already taking Temazepam and sig states she can take 2 in the evening I instructed her that she can take 1 additional 15 mg dose as needed for anxiety/panic and cont 15 mg at bedtime. I also provided her with resources from [facility] on meditation and self-care  Follow Up Instructions:    I discussed the assessment and treatment plan with the patient. The patient was provided an opportunity to ask questions and all were answered. The patient agreed with the plan and demonstrated an understanding of the instructions.   The patient was advised to call back or seek an in-person evaluation if the symptoms worsen or if the condition fails to improve as anticipated.  I provided 21-30 minutes of non-face-to-face time during this encounter.   Trixie Dredge, [place]

## 2019-03-22 ENCOUNTER — Encounter: Payer: Self-pay | Admitting: Physician Assistant

## 2019-03-22 DIAGNOSIS — F418 Other specified anxiety disorders: Secondary | ICD-10-CM | POA: Insufficient documentation

## 2019-03-23 ENCOUNTER — Other Ambulatory Visit: Payer: Self-pay

## 2019-03-23 ENCOUNTER — Ambulatory Visit (INDEPENDENT_AMBULATORY_CARE_PROVIDER_SITE_OTHER): Payer: No Typology Code available for payment source | Admitting: Psychiatry

## 2019-03-23 DIAGNOSIS — F320 Major depressive disorder, single episode, mild: Secondary | ICD-10-CM | POA: Diagnosis not present

## 2019-03-23 DIAGNOSIS — F3341 Major depressive disorder, recurrent, in partial remission: Secondary | ICD-10-CM

## 2019-03-23 DIAGNOSIS — F1099 Alcohol use, unspecified with unspecified alcohol-induced disorder: Secondary | ICD-10-CM

## 2019-03-23 DIAGNOSIS — F418 Other specified anxiety disorders: Secondary | ICD-10-CM

## 2019-03-23 MED ORDER — GABAPENTIN 300 MG PO CAPS: ORAL_CAPSULE | ORAL | 5 refills | Status: DC

## 2019-03-23 MED ORDER — BUPROPION HCL ER (XL) 300 MG PO TB24: 300.0000 mg | ORAL_TABLET | Freq: Every day | ORAL | 1 refills | Status: DC

## 2019-03-23 MED ORDER — ARIPIPRAZOLE 5 MG PO TABS: 5.0000 mg | ORAL_TABLET | Freq: Every day | ORAL | 5 refills | Status: DC

## 2019-03-23 MED FILL — GABAPENTIN 300 MG CAPSULE: 300 | 30 days supply | Qty: 150 | Fill #0

## 2019-03-23 MED FILL — ARIPiprazole 5 MG TABS: 5 | 30 days supply | Qty: 30 | Fill #0

## 2019-03-23 NOTE — Progress Notes (Signed)
Psychiatric Initial Adult Assessment   Patient Identification: Mary Stark MRN:  [id number] Date of Evaluation:  03/23/2019 Referral Source: Dr. Sherlie Ban Chief Complaint:  Anxiety.  I think come down. Visit Diagnosis: Major clinical depression  At this time the patient is doing fairly well.  Changes in her life include the fact that she is got her daughter and the boyfriend to move out of the house.  The patient therefore takes care of the 2 sons of her daughter who are ages 82 and 38.  The patient takes care of them during the week and the daughter Mary Stark takes them on the weekends.  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  Lately they have been asking her to work in a low risk corona virus setting.  She became extremely panicky and anxious and refused.  Fortunately, she found a colleague who would take her place.  Patient is very frightened to work in this setting.  She is got a primary care doctor to write her a letter that she is emotionally ill equipped to work in that setting.  Apparently the letter is been written and not clear is been accepted.  The patient actually denies daily persistent depression.  She is able to do sewing and take a break from everything.  She is not persistently depressed and does not pervasive.  Her sleep is erratic.  The use of Restoril does not have much of an effect.  The fact that she does a night shift makes it hard to get consistent sleep.  Overall she does sleep 7 hours but is broken up in the middle with a few hours of being up.  Patient is eating well has good energy can concentrate without problem.  She is a good sense of worth.  She is not suicidal.  The patient has no evidence of psychotic symptoms.  Remarkably her alcohol use has simply dropped off.  Over the last month because there is no basketball watch her alcohol is gone.  She uses no drugs.  The patient takes Neurontin 300 mg 1 morning and 2 at night and she is not sure if it helps.  She  takes her antidepressant and Abilify without problem.  Today we did not discuss therapy as we talked about last time.  Overall though the patient is functioning well.  It should also be noted that her husband has returned and living with her for good while now.  The patient wants her husband to be safe.  He is not medically all that well.  The patient is very frightened about infection with coronavirus.  She knows she works around more likely than he does and she is fearful that she will spread it to him.  Therefore she is going to make every effort to have him live in a different setting.   ICD-10-CM   1. Recurrent major depressive disorder, in partial remission (HCC) F33.41 buPROPion (WELLBUTRIN XL) 300 MG 24 hr tablet  2. Depression with anxiety F41.8 ARIPiprazole (ABILIFY) 5 MG tablet    History of Present Illness:     Associated Signs/Symptoms: Depression Symptoms:  feelings of worthlessness/guilt, (Hypo) Manic Symptoms:   Anxiety Symptoms:   Psychotic Symptoms:   PTSD Symptoms:   Past Psychiatric History: multiple presents now taking Abilify 10 mg Wellbutrin  300 mg  Previous Psychotropic Medications: as above  Substance Abuse History in the last 12 months:  Yes.    Consequences of Substance Abuse: Medical Consequences:  Past Medical History:  Past Medical History:  Diagnosis Date  . Alcohol dependence ([place])   . Depression 07/17/2013  . History of hepatitis C 07/17/2013  . History of rheumatic fever   . Hyperlipidemia 07/17/2013  . Hypertension   . Tobacco use   . Toe fracture, right 10/07/2016  . Vitamin D deficiency 07/17/2013    Past Surgical History:  Procedure Laterality Date  . CESAREAN SECTION  1984  . FINGER SURGERY Right    5th  . GYNECOLOGIC CRYOSURGERY    . TONSILLECTOMY      Family Psychiatric History:   Family History:  Family History  Problem Relation Age of Onset  . Ovarian cancer Mother   . Cardiomyopathy Father   . Valvular heart disease Father    . Liver cancer Brother   . Protein C deficiency Brother   . Protein C deficiency Brother   . Stroke Brother     Social History:   Social History   Socioeconomic History  . Marital status: Married    Spouse name: Not on file  . Number of children: Not on file  . Years of education: Not on file  . Highest education level: Not on file  Occupational History  . Not on file  Social Needs  . Financial resource strain: Not on file  . Food insecurity:    Worry: Not on file    Inability: Not on file  . Transportation needs:    Medical: Not on file    Non-medical: Not on file  Tobacco Use  . Smoking status: Former Smoker    Packs/day: 1.00    Years: 25.00    Pack years: 25.00    Types: Cigarettes    Last attempt to quit: 2012    Years since quitting: 8.2  . Smokeless tobacco: Current User  . Tobacco comment: <1 cig per month  Substance and Sexual Activity  . Alcohol use: Yes    Alcohol/week: 24.0 - 36.0 standard drinks    Types: 24 - 36 Cans of beer per week  . Drug use: No  . Sexual activity: Yes    Birth control/protection: None  Lifestyle  . Physical activity:    Days per week: Not on file    Minutes per session: Not on file  . Stress: Not on file  Relationships  . Social connections:    Talks on phone: Not on file    Gets together: Not on file    Attends religious service: Not on file    Active member of club or organization: Not on file    Attends meetings of clubs or organizations: Not on file    Relationship status: Not on file  Other Topics Concern  . Not on file  Social History Narrative  . Not on file    Additional Social History:   Allergies:   Allergies  Allergen Reactions  . Metformin And Related Other (See Comments)    Lactic acidosis   . Penicillins Hives and Rash    Has patient had a PCN reaction causing immediate rash, facial/tongue/throat swelling, SOB or lightheadedness with hypotension: Yes Has patient had a PCN reaction causing severe  rash involving mucus membranes or skin necrosis:No Has patient had a PCN reaction that required hospitalization: Yes Has patient had a PCN reaction occurring within the last 10 years: No If all of the above answers are "NO", then may proceed with Cephalosporin use.     Metabolic Disorder Labs: Lab Results  Component  Value Date   HGBA1C 5.6 02/07/2013   No results found for: PROLACTIN Lab Results  Component Value Date   CHOL 183 01/10/2018   TRIG 79 01/10/2018   HDL 83 01/10/2018   CHOLHDL 2.2 01/10/2018   VLDL 20 08/30/2013   LDLCALC 84 01/10/2018   LDLCALC 78 08/30/2013     Current Medications: Current Outpatient Medications  Medication Sig Dispense Refill  . acetaminophen (TYLENOL) 500 MG tablet Take 500 mg by mouth every 6 (six) hours as needed.    Marland Kitchen amLODipine (NORVASC) 5 MG tablet Take 1 tablet (5 mg total) by mouth daily. 90 tablet 1  . ARIPiprazole (ABILIFY) 5 MG tablet Take 1 tablet (5 mg total) by mouth at bedtime. 30 tablet 5  . Ascorbic Acid (VITAMIN C PO) Take 1 tablet by mouth daily.    . ASPIRIN ADULT LOW STRENGTH 81 MG EC tablet TAKE 1 TABLET (81 MG TOTAL) BY MOUTH DAILY. 90 tablet 3  . buPROPion (WELLBUTRIN XL) 300 MG 24 hr tablet Take 1 tablet (300 mg total) by mouth daily. 90 tablet 1  . CALCIUM PO Take 1 tablet by mouth daily.    . Coenzyme Q10 (CO Q 10 PO) Take by mouth.    . Cyanocobalamin (VITAMIN B 12 PO) Take 1 tablet by mouth daily.    . diclofenac sodium (VOLTAREN) 1 % GEL Apply 4 g topically 4 (four) times daily. To affected joint. 900 g 1  . gabapentin (NEURONTIN) 300 MG capsule 1  qam   4   qhs 150 capsule 5  . GLUCOSAMINE-CHONDROITIN PO Take 1 tablet by mouth daily.    . hydrochlorothiazide (HYDRODIURIL) 12.5 MG tablet Take 1 tablet (12.5 mg total) by mouth daily. 90 tablet 1  . Nebivolol HCl 20 MG TABS Take 1 tablet (20 mg total) by mouth daily. 90 tablet 1  . Omega-3 Fatty Acids (FISH OIL PO) Take 1 tablet by mouth daily.    Marland Kitchen omeprazole  (PRILOSEC) 20 MG capsule Take 1 capsule (20 mg total) by mouth daily. 90 capsule 1  . potassium chloride SA (K-DUR,KLOR-CON) 20 MEQ tablet Take 1 tablet (20 mEq total) by mouth daily. 90 tablet 1  . Prasterone (INTRAROSA) 6.5 MG INST Place 1 application vaginally 2 (two) times a week. 84 each 11  . PROAIR HFA 108 (90 Base) MCG/ACT inhaler INHALE 1 TO 2 PUFFS BY MOUTH INTO THE LUNGS EVERY 4 HOURS AS NEEDED FOR WHEEZING OR SHORTNESS OF BREATH 8.5 g 1  . simvastatin (ZOCOR) 20 MG tablet TAKE 1 TABLET BY MOUTH AT BEDTIME. DUE FOR LABS 90 tablet 0  . VITAMIN E PO Take 1 tablet by mouth daily.     No current facility-administered medications for this visit.     Neurologic: Headache: No Seizure: No Paresthesias:No  Musculoskeletal: Strength & Muscle Tone: within normal limits Gait & Station: normal Patient leans: N/A  Psychiatric Specialty Exam: ROS  There were no vitals taken for this visit.There is no height or weight on file to calculate BMI.  General Appearance: Casual  Eye Contact:  Good  Speech:  NA  Volume:  Normal  Mood:  Negative  Affect:  Appropriate  Thought Process:  Goal Directed  Orientation:  Full (Time, Place, and Person)  Thought Content:  Logical  Suicidal Thoughts:  No  Homicidal Thoughts:  No  Memory:  NA Immediate;   Good  Judgement:  Good  Insight:  Good  Psychomotor Activity:  NA  Concentration:    Recall:  Good  Fund of Knowledge:Good  Language: Good  Akathisia:  No  Handed:  Right  AIMS (if indicated):    Assets:  Desire for Improvement  ADL's:  Intact  Cognition: WNL  Sleep:      Treatment Plan Summary: This patient is amazingly resilient.  Her first problem is that of major clinical depression she does well 300 mg Wellbutrin 5 mg of Abilify.  Over the next 6 months we will talk about possibly reducing and discontinuing her Abilify.  She is had no complications with it at this time.  Her second problem is that of alcohol use disorder.  At this  time we will go ahead and increase her Neurontin to 300 mg 1 in the morning and 4 at night.  Her third problem is insomnia.  Is not clear if it is truly insomnia but is clear that she needs to get off to sleep and the Neurontin does have a calming effect.  For her alcohol use disorder the possibility of using Campral will be discussed at her next visit.  Overall patient is improving.  She is to active problem is that of major depression that of alcohol use disorder.  Cannot tolerate naltrexone.  This patient to return to see me in 2-1/2 months.  Noted is at this time we will discontinue her Restoril.  Jerral Ralph, MD 4/9/20202:55 PM

## 2019-03-25 ENCOUNTER — Other Ambulatory Visit: Payer: Self-pay | Admitting: Physician Assistant

## 2019-03-25 MED FILL — INTRAROSA 6.5 MG VAG INSERT: 6.5 | 28 days supply | Qty: 28 | Fill #0

## 2019-03-25 MED FILL — buPROPion HCL ER (XL) 300 M: 300 | 90 days supply | Qty: 90 | Fill #0

## 2019-03-25 MED FILL — AMLODIPINE BESYLATE 5 MG TA: 5 | 90 days supply | Qty: 90 | Fill #0

## 2019-03-25 MED FILL — ARIPiprazole 5 MG TABS: 5 | 30 days supply | Qty: 30 | Fill #1

## 2019-03-28 LAB — RENAL PROFILE WITH ESTIMATED GFR
Albumin: 4.6 g/dL (ref 3.6–5.1)
BUN: 16 mg/dL (ref 7–25)
CO2: 28 mmol/L (ref 20–32)
Calcium: 9.3 mg/dL (ref 8.6–10.4)
Chloride: 101 mmol/L (ref 98–110)
Creat: 0.97 mg/dL (ref 0.50–0.99)
GFR, Est African American: 73 mL/min/{1.73_m2} (ref >=60)
GFR, Est Non African American: 63 mL/min/{1.73_m2} (ref >=60)
Glucose, Bld: 96 mg/dL (ref 65–99)
Phosphorus: 3.9 mg/dL (ref 2.5–4.5)
Potassium: 4.2 mmol/L (ref 3.5–5.3)
Sodium: 137 mmol/L (ref 135–146)

## 2019-04-12 ENCOUNTER — Ambulatory Visit (HOSPITAL_COMMUNITY): Payer: No Typology Code available for payment source | Admitting: Psychiatry

## 2019-04-13 ENCOUNTER — Encounter (INDEPENDENT_AMBULATORY_CARE_PROVIDER_SITE_OTHER): Payer: No Typology Code available for payment source | Admitting: Family Medicine

## 2019-04-13 DIAGNOSIS — M25562 Pain in left knee: Secondary | ICD-10-CM

## 2019-04-14 NOTE — Telephone Encounter (Signed)
Patient continues to experience bothersome knee pain and has failed to improve despite typical conservative management including physician directed home exercise program, oral NSAIDs topical NSAIDs Tylenol brace rest ice.  Additionally she had steroid injection which did not provide lasting benefit.  At this point x-rays were largely unremarkable.  Plan for MRI for potential surgical planning.  We will schedule video visit after MRI results back.  Time spent 5 minutes.

## 2019-04-23 ENCOUNTER — Encounter: Payer: Self-pay | Admitting: Family Medicine

## 2019-04-24 MED FILL — ARIPIPRAZOLE 5 MG TABS: 5 | 30 days supply | Qty: 30 | Fill #2

## 2019-04-25 ENCOUNTER — Telehealth: Payer: Self-pay | Admitting: Physician Assistant

## 2019-04-25 NOTE — Telephone Encounter (Signed)
Patient is in a lot of pain. States that this past weekend she had to stop 2/3 times at the [employer] before she can get to the parking lot. Would like to know if she can get wrote out for this weekend, like a leave of absence. Does not think she will make it for Friday, Saturday and Sunday consecutively. Please contact and advise.

## 2019-04-26 ENCOUNTER — Encounter: Payer: Self-pay | Admitting: Family Medicine

## 2019-04-26 NOTE — Telephone Encounter (Signed)
I have written a letter saying to return to work on Monday May 18.  We should have the MRI results on the 18th of the 19th and will schedule a video visit to talk about the results and talk about next steps

## 2019-04-26 NOTE — Telephone Encounter (Signed)
Left pt msg advising note in chart for work. Call back info provided

## 2019-04-28 MED FILL — GABAPENTIN 300 MG CAPSULE: 300 | 30 days supply | Qty: 150 | Fill #1

## 2019-05-01 ENCOUNTER — Ambulatory Visit (INDEPENDENT_AMBULATORY_CARE_PROVIDER_SITE_OTHER): Payer: No Typology Code available for payment source

## 2019-05-01 ENCOUNTER — Other Ambulatory Visit: Payer: Self-pay

## 2019-05-01 DIAGNOSIS — M25562 Pain in left knee: Secondary | ICD-10-CM | POA: Diagnosis not present

## 2019-05-02 ENCOUNTER — Telehealth (INDEPENDENT_AMBULATORY_CARE_PROVIDER_SITE_OTHER): Payer: No Typology Code available for payment source | Admitting: Family Medicine

## 2019-05-02 ENCOUNTER — Encounter: Payer: Self-pay | Admitting: Family Medicine

## 2019-05-02 VITALS — BP 137/80 | HR 73 | Ht 64.0 in | Wt 197.5 lb

## 2019-05-02 DIAGNOSIS — M25562 Pain in left knee: Secondary | ICD-10-CM | POA: Diagnosis not present

## 2019-05-02 DIAGNOSIS — S83242A Other tear of medial meniscus, current injury, left knee, initial encounter: Secondary | ICD-10-CM | POA: Diagnosis not present

## 2019-05-02 MED ORDER — HYDROCODONE-ACETAMINOPHEN 5-325 MG PO TABS: 1.0000 | ORAL_TABLET | Freq: Four times a day (QID) | ORAL | 0 refills | Status: DC | PRN

## 2019-05-02 MED FILL — INTRAROSA 6.5 MG VAG INSERT: 6.5 | 28 days supply | Qty: 28 | Fill #1

## 2019-05-02 MED FILL — HYDROCODON-APAP 5-325: 5-325 | 4 days supply | Qty: 15 | Fill #0

## 2019-05-02 NOTE — Patient Instructions (Signed)
Thank you for coming in today.    

## 2019-05-02 NOTE — Progress Notes (Signed)
Virtual Visit  via Video Note  I connected with      Mary Stark by a video enabled telemedicine application and verified that I am speaking with the correct person using two identifiers.   I discussed the limitations of evaluation and management by telemedicine and the availability of in person appointments. The patient expressed understanding and agreed to proceed.  History of Present Illness: Mary Stark is a 63 y.o. female who would like to discuss left knee pain.   Patient suffered a twisting injury to her knee during a fall in January 2020.  She had initial evaluation March 16.  During that visit she had trial of steroid injection.  She notes that her symptoms never really resolved and in late April after communication proceed with MRI.  MRI shows as below small meniscus tears as well as degenerative changes in the knee.  She presents today video visit today for follow-up evaluation and discussion of knee pain and next steps.   Observations/Objective: BP 137/80   Pulse 73   Ht 5\' 4"  (1.626 m)   Wt 197 lb 8 oz (89.6 kg)   BMI 33.90 kg/m  Wt Readings from Last 5 Encounters:  05/02/19 197 lb 8 oz (89.6 kg)  03/21/19 200 lb (90.7 kg)  02/27/19 208 lb 1.9 oz (94.4 kg)  02/08/19 208 lb (94.3 kg)  11/30/18 195 lb (88.5 kg)   Exam: Appearance nontoxic no acute distress Normal Speech.    Lab and Radiology Results No results found for this or any previous visit (from the past 72 hour(s)). Mr Knee Left  Wo Contrast  Result Date: 05/02/2019 CLINICAL DATA:  Acute twisting injury of the knee. Leg numbness for 2 months EXAM: MRI OF THE LEFT KNEE WITHOUT CONTRAST TECHNIQUE: Multiplanar, multisequence MR imaging of the knee was performed. No intravenous contrast was administered. COMPARISON:  None. FINDINGS: MENISCI Medial meniscus:  Radial tear of the body of the medial meniscus. Lateral meniscus: Small undersurface tear of the posterior horn of the lateral meniscus towards the  root. LIGAMENTS Cruciates:  Intact ACL and PCL. Collaterals: Medial collateral ligament is intact. Lateral collateral ligament complex is intact. CARTILAGE Patellofemoral: Partial-thickness cartilage loss of the medial patellar facet. Medial: Partial-thickness cartilage loss of the medial femorotibial compartment. Lateral:  No chondral defect. Joint:  No joint effusion. Normal Hoffa's fat. No plical thickening. Popliteal Fossa:  No Baker cyst. Intact popliteus tendon. Extensor Mechanism: Intact quadriceps tendon. Intact patellar tendon. Intact medial patellar retinaculum. Intact lateral patellar retinaculum. Intact MPFL. Bones:  No acute osseous abnormality.  No aggressive osseous lesion. Other: No fluid collection or hematoma.  Muscles are normal. IMPRESSION: 1. Radial tear of the body of the medial meniscus. 2. Small undersurface tear of the posterior horn of the lateral meniscus towards the root. 3. Partial-thickness cartilage loss of the medial patellofemoral compartment and medial femorotibial compartment. Electronically Signed   By: Kathreen Devoid   On: 05/02/2019 09:58    I personally (independently) visualized and performed the interpretation of the images attached in this note.  Assessment and Plan: 63 y.o. female with persistent knee pain.  Patient had an injury and has had failure to improve despite adequate conservative management including steroid injection and home exercise program.  Symptoms are now disabling her and preventing work.  Based on MRI results reasonable to refer to orthopedics for evaluation for potential arthroscopic debridement.  Referral placed.  Recheck as needed.  Work note provided.  PDMP reviewed during  this encounter. Orders Placed This Encounter  Procedures  . Ambulatory referral to Orthopedic Surgery    Referral Priority:   Routine    Referral Type:   Surgical    Referral Reason:   Specialty Services Required    Requested Specialty:   Orthopedic Surgery    Number  of Visits Requested:   1   Meds ordered this encounter  Medications  . HYDROcodone-acetaminophen (NORCO/VICODIN) 5-325 MG tablet    Sig: Take 1 tablet by mouth every 6 (six) hours as needed.    Dispense:  15 tablet    Refill:  0    Follow Up Instructions:    I discussed the assessment and treatment plan with the patient. The patient was provided an opportunity to ask questions and all were answered. The patient agreed with the plan and demonstrated an understanding of the instructions.   The patient was advised to call back or seek an in-person evaluation if the symptoms worsen or if the condition fails to improve as anticipated.  Time: 15 minutes of intraservice time, with >22 minutes of total time during today's visit.      Historical information moved to improve visibility of documentation.  Past Medical History:  Diagnosis Date  . Alcohol dependence ([place])   . Depression 07/17/2013  . History of hepatitis C 07/17/2013  . History of rheumatic fever   . Hyperlipidemia 07/17/2013  . Hypertension   . Tobacco use   . Toe fracture, right 10/07/2016  . Vitamin D deficiency 07/17/2013   Past Surgical History:  Procedure Laterality Date  . CESAREAN SECTION  1984  . FINGER SURGERY Right    5th  . GYNECOLOGIC CRYOSURGERY    . TONSILLECTOMY     Social History   Tobacco Use  . Smoking status: Former Smoker    Packs/day: 1.00    Years: 25.00    Pack years: 25.00    Types: Cigarettes    Last attempt to quit: 2012    Years since quitting: 8.3  . Smokeless tobacco: Current User  . Tobacco comment: <1 cig per month  Substance Use Topics  . Alcohol use: Yes    Alcohol/week: 24.0 - 36.0 standard drinks    Types: 24 - 36 Cans of beer per week   family history includes Cardiomyopathy in her father; Liver cancer in her brother; Ovarian cancer in her mother; Protein C deficiency in her brother and brother; Stroke in her brother; Valvular heart disease in her  father.  Medications: Current Outpatient Medications  Medication Sig Dispense Refill  . acetaminophen (TYLENOL) 500 MG tablet Take 500 mg by mouth every 6 (six) hours as needed.    Marland Kitchen amLODipine (NORVASC) 5 MG tablet Take 1 tablet (5 mg total) by mouth daily. 90 tablet 1  . ARIPiprazole (ABILIFY) 5 MG tablet Take 1 tablet (5 mg total) by mouth at bedtime. 30 tablet 5  . Ascorbic Acid (VITAMIN C PO) Take 1 tablet by mouth daily.    . ASPIRIN ADULT LOW STRENGTH 81 MG EC tablet TAKE 1 TABLET (81 MG TOTAL) BY MOUTH DAILY. 90 tablet 3  . buPROPion (WELLBUTRIN XL) 300 MG 24 hr tablet Take 1 tablet (300 mg total) by mouth daily. 90 tablet 1  . CALCIUM PO Take 1 tablet by mouth daily.    . Coenzyme Q10 (CO Q 10 PO) Take by mouth.    . Cyanocobalamin (VITAMIN B 12 PO) Take 1 tablet by mouth daily.    . diclofenac sodium (  VOLTAREN) 1 % GEL Apply 4 g topically 4 (four) times daily. To affected joint. 900 g 1  . gabapentin (NEURONTIN) 300 MG capsule 1  qam   4   qhs 150 capsule 5  . GLUCOSAMINE-CHONDROITIN PO Take 1 tablet by mouth daily.    . hydrochlorothiazide (HYDRODIURIL) 12.5 MG tablet Take 1 tablet (12.5 mg total) by mouth daily. 90 tablet 1  . Nebivolol HCl 20 MG TABS Take 1 tablet (20 mg total) by mouth daily. 90 tablet 1  . Omega-3 Fatty Acids (FISH OIL PO) Take 1 tablet by mouth daily.    Marland Kitchen omeprazole (PRILOSEC) 20 MG capsule Take 1 capsule (20 mg total) by mouth daily. 90 capsule 1  . potassium chloride SA (K-DUR,KLOR-CON) 20 MEQ tablet Take 1 tablet (20 mEq total) by mouth daily. 90 tablet 1  . Prasterone (INTRAROSA) 6.5 MG INST Place 1 application vaginally 2 (two) times a week. 84 each 11  . PROAIR HFA 108 (90 Base) MCG/ACT inhaler INHALE 1 TO 2 PUFFS BY MOUTH INTO THE LUNGS EVERY 4 HOURS AS NEEDED FOR WHEEZING OR SHORTNESS OF BREATH 8.5 g 1  . simvastatin (ZOCOR) 20 MG tablet Take 1 tablet (20 mg total) by mouth daily. Due for labs 30 tablet 0  . VITAMIN E PO Take 1 tablet by mouth daily.     Marland Kitchen HYDROcodone-acetaminophen (NORCO/VICODIN) 5-325 MG tablet Take 1 tablet by mouth every 6 (six) hours as needed. 15 tablet 0   No current facility-administered medications for this visit.    Allergies  Allergen Reactions  . Metformin And Related Other (See Comments)    Lactic acidosis   . Penicillins Hives and Rash    Has patient had a PCN reaction causing immediate rash, facial/tongue/throat swelling, SOB or lightheadedness with hypotension: Yes Has patient had a PCN reaction causing severe rash involving mucus membranes or skin necrosis:No Has patient had a PCN reaction that required hospitalization: Yes Has patient had a PCN reaction occurring within the last 10 years: No If all of the above answers are "NO", then may proceed with Cephalosporin use.

## 2019-05-04 ENCOUNTER — Ambulatory Visit: Payer: No Typology Code available for payment source | Admitting: Orthopaedic Surgery

## 2019-05-04 ENCOUNTER — Other Ambulatory Visit: Payer: Self-pay | Admitting: Family Medicine

## 2019-05-04 ENCOUNTER — Encounter: Payer: Self-pay | Admitting: Orthopaedic Surgery

## 2019-05-04 ENCOUNTER — Telehealth: Payer: Self-pay

## 2019-05-04 ENCOUNTER — Ambulatory Visit (INDEPENDENT_AMBULATORY_CARE_PROVIDER_SITE_OTHER): Payer: No Typology Code available for payment source

## 2019-05-04 ENCOUNTER — Other Ambulatory Visit: Payer: Self-pay

## 2019-05-04 DIAGNOSIS — M1712 Unilateral primary osteoarthritis, left knee: Secondary | ICD-10-CM

## 2019-05-04 MED ORDER — CELECOXIB 200 MG PO CAPS: 200.0000 mg | ORAL_CAPSULE | Freq: Two times a day (BID) | ORAL | 3 refills | Status: DC

## 2019-05-04 MED ORDER — DICLOFENAC SODIUM 2 % TD SOLN: 2.0000 g | Freq: Two times a day (BID) | TRANSDERMAL | 3 refills | Status: DC | PRN

## 2019-05-04 MED FILL — CELECOXIB 200 MG CAPSULE: 200 | 30 days supply | Qty: 60 | Fill #0

## 2019-05-04 NOTE — Telephone Encounter (Signed)
Please submit for gel injection  Left knee. Dr Erlinda Hong

## 2019-05-04 NOTE — Progress Notes (Signed)
Office Visit Note   Patient: Mary Stark           Date of Birth: [birth year]           MRN: [id number] Visit Date: 05/04/2019              Requested by: Gregor Hams, [place] [address] [place], [place] [id number] PCP: Trixie Dredge, PA-C   Assessment & Plan: Visit Diagnoses:  1. Primary osteoarthritis of left knee     Plan: I reviewed the MRI findings which show fairly unimpressive meniscus tears that are more consistent with degenerative changes than acute injuries.  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  We had a lengthy discussion regarding nonsurgical treatments that include weight loss, Celebrex, Pennsaid, viscosupplementation injections.  We briefly discussed knee replacement surgery should conservative treatments fail.  I can tell that she is in significant pain and would have difficulty working therefore I would not write her out of work for this weekend.  We will be in touch about the viscosupplementation injections.  This patient is diagnosed with osteoarthritis of the knee(s).    Radiographs show evidence of joint space narrowing, osteophytes, subchondral sclerosis and/or subchondral cysts.  This patient has knee pain which interferes with functional and activities of daily living.    This patient has experienced inadequate response, adverse effects and/or intolerance with conservative treatments such as acetaminophen, NSAIDS, topical creams, physical therapy or regular exercise, knee bracing and/or weight loss.   This patient has experienced inadequate response or has a contraindication to intra articular steroid injections for at least 3 months.   This patient is not scheduled to have a total knee replacement within 6 months of starting treatment with viscosupplementation.  Follow-Up Instructions: Return if symptoms worsen or  fail to improve.   Orders:  Orders Placed This Encounter  Procedures  . XR Knee Complete 4 Views Left   Meds ordered this encounter  Medications  . celecoxib (CELEBREX) 200 MG capsule    Sig: Take 1 capsule (200 mg total) by mouth 2 (two) times daily.    Dispense:  60 capsule    Refill:  3  . Diclofenac Sodium (PENNSAID) 2 % SOLN    Sig: Apply 2 g topically 2 (two) times daily as needed (to affected area).    Dispense:  112 g    Refill:  3      Procedures: No procedures performed   Clinical Data: No additional findings.   Subjective: Chief Complaint  Patient presents with  . Left Knee - Pain    Mary Stark is a 62 year old female that have been seeing for a while who comes in for evaluation of her left knee pain.  Overall this has been bothering her for several months.  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  She has severe difficulty with squatting and kneeling.  She has had to take several days off of work because of severe left knee pain.  She describes her pain as a constant aching pain that starts on the medial side of her knee and radiates across her front of the knee to the superior lateral aspect.  She does not really report any mechanical symptoms.  Cortisone injection gave her partial and temporary relief.   Review of Systems  Constitutional: Negative.   HENT: Negative.  Eyes: Negative.   Respiratory: Negative.   Cardiovascular: Negative.   Endocrine: Negative.   Musculoskeletal: Negative.   Neurological: Negative.   Hematological: Negative.   Psychiatric/Behavioral: Negative.   All other systems reviewed and are negative.    Objective: Vital Signs: There were no vitals taken for this visit.  Physical Exam Vitals signs and nursing note reviewed.  Constitutional:      Appearance: She is well-developed.  Pulmonary:     Effort: Pulmonary effort is normal.  Skin:    General: Skin is warm.     Capillary Refill: Capillary  refill takes less than 2 seconds.  Neurological:     Mental Status: She is alert and oriented to person, place, and time.  Psychiatric:        Behavior: Behavior normal.        Thought Content: Thought content normal.        Judgment: Judgment normal.     Ortho Exam Left knee exam shows no joint effusion.  She does not have any medial joint line tenderness.  Minimal patellofemoral crepitus.  Overall normal range of motion. Specialty Comments:  No specialty comments available.  Imaging: Xr Knee Complete 4 Views Left  Result Date: 05/04/2019 Moderate osteoarthritis with a mild amount of medial joint space narrowing and periarticular spurring    PMFS History: Patient Active Problem List   Diagnosis Date Noted  . Anxiety about health 03/22/2019  . Hypokalemia 03/21/2019  . Left medial knee pain 02/13/2019  . Essential hypertension with goal blood pressure less than 140/90 02/13/2019  . 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/2018  . Anemia due to blood loss, acute 04/14/2018  . Hypomagnesemia 04/14/2018  . Hypocalcemia 04/14/2018  . Tobacco abuse 04/08/2018  . Depression 04/07/2018  . AKI (acute kidney injury) ([place]) 04/07/2018  . Sepsis ([place]) 04/07/2018  . Acute colitis 04/07/2018  . Avascular necrosis of humeral head, right ([place]) 03/30/2018  . NSAID long-term use 03/30/2018  . Encounter for weight loss counseling 03/30/2018  . Seborrheic keratoses 03/15/2018  . History of nonmelanoma skin cancer 03/02/2018  . Skin lesion of chest wall 03/02/2018  . Alcohol dependence with unspecified alcohol-induced disorder ([place]) 02/11/2018  . Severe episode of recurrent major depressive disorder, without psychotic features ([place]) 02/11/2018  . Transaminitis 01/06/2018  . Nicotine dependence 01/06/2018  . Heavy alcohol consumption 01/06/2018  . Encounter for monitoring statin therapy 01/06/2018  . Class 1 obesity due to excess calories with serious comorbidity in adult 01/06/2018  . Chronic pain syndrome  04/21/2017  . Chronic right shoulder pain 07/03/2015  . Vaginismus 04/12/2014  . Menopausal state 11/08/2013  . Family history of ovarian cancer 09/27/2013  . 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 Personal history of colonic polyps 07/18/2013  . Dyslipidemia, goal LDL below 100 07/17/2013  . Depression with anxiety 07/17/2013  . Hx of hepatitis C 07/17/2013  . Vitamin D deficiency 07/17/2013  . Right lumbar radiculopathy 07/17/2013  . History of alcohol dependence ([place]) 07/17/2013  . H/O: rheumatic fever 06/30/2012  . Insomnia 06/30/2012   Past Medical History:  Diagnosis Date  . Alcohol dependence ([place])   . Depression 07/17/2013  . History of hepatitis C 07/17/2013  . History of rheumatic fever   . Hyperlipidemia 07/17/2013  . Hypertension   . Tobacco use   . Toe fracture, right 10/07/2016  . Vitamin D deficiency 07/17/2013    Family History  Problem Relation Age of Onset  . Ovarian cancer Mother   . Cardiomyopathy Father   .  Valvular heart disease Father   . Liver cancer Brother   . Protein C deficiency Brother   . Protein C deficiency Brother   . Stroke Brother     Past Surgical History:  Procedure Laterality Date  . CESAREAN SECTION  1984  . FINGER SURGERY Right    5th  . GYNECOLOGIC CRYOSURGERY    . TONSILLECTOMY     Social History   Occupational History  . Not on file  Tobacco Use  . Smoking status: Former Smoker    Packs/day: 1.00    Years: 25.00    Pack years: 25.00    Types: Cigarettes    Last attempt to quit: 2012    Years since quitting: 8.3  . Smokeless tobacco: Current User  . Tobacco comment: <1 cig per month  Substance and Sexual Activity  . Alcohol use: Yes    Alcohol/week: 24.0 - 36.0 standard drinks    Types: 24 - 36 Cans of beer per week  . Drug use: No  . Sexual activity: Yes    Birth control/protection: None

## 2019-05-05 NOTE — Telephone Encounter (Signed)
Noted  

## 2019-05-09 ENCOUNTER — Telehealth: Payer: Self-pay

## 2019-05-09 ENCOUNTER — Telehealth: Payer: Self-pay | Admitting: Orthopaedic Surgery

## 2019-05-09 NOTE — Telephone Encounter (Signed)
Submitted VOB for Monovisc, left knee. 

## 2019-05-09 NOTE — Telephone Encounter (Signed)
Pt called asking to speak to [place].  Apparently some papers were not filled out.  And she has some questions about medication.

## 2019-05-10 NOTE — Telephone Encounter (Signed)
Called patient back and no answer LMOM. Called her to see what she needs.

## 2019-05-14 ENCOUNTER — Telehealth: Payer: Self-pay | Admitting: *Deleted

## 2019-05-14 NOTE — Telephone Encounter (Signed)
LM to call [phone] for  information re: visit @ [facility] on 05/04/2019.

## 2019-05-16 ENCOUNTER — Encounter: Payer: Self-pay | Admitting: Physician Assistant

## 2019-05-16 ENCOUNTER — Ambulatory Visit (INDEPENDENT_AMBULATORY_CARE_PROVIDER_SITE_OTHER): Payer: No Typology Code available for payment source | Admitting: Physician Assistant

## 2019-05-16 VITALS — BP 121/78 | HR 74 | Temp 97.6°F | Wt 197.0 lb

## 2019-05-16 DIAGNOSIS — F5105 Insomnia due to other mental disorder: Secondary | ICD-10-CM

## 2019-05-16 DIAGNOSIS — Z79899 Other long term (current) drug therapy: Secondary | ICD-10-CM

## 2019-05-16 DIAGNOSIS — I10 Essential (primary) hypertension: Secondary | ICD-10-CM | POA: Diagnosis not present

## 2019-05-16 DIAGNOSIS — E785 Hyperlipidemia, unspecified: Secondary | ICD-10-CM

## 2019-05-16 DIAGNOSIS — F419 Anxiety disorder, unspecified: Secondary | ICD-10-CM

## 2019-05-16 DIAGNOSIS — Z0000 Encounter for general adult medical examination without abnormal findings: Secondary | ICD-10-CM | POA: Diagnosis not present

## 2019-05-16 DIAGNOSIS — Z1231 Encounter for screening mammogram for malignant neoplasm of breast: Secondary | ICD-10-CM

## 2019-05-16 MED ORDER — TEMAZEPAM 15 MG PO CAPS: 15.0000 mg | ORAL_CAPSULE | Freq: Every evening | ORAL | 0 refills | Status: DC | PRN

## 2019-05-16 MED FILL — TEMAZEPAM 15 MG CAPSULE: 15 | 30 days supply | Qty: 30 | Fill #0

## 2019-05-16 NOTE — Progress Notes (Signed)
HPI:                                                                Mary Stark is a 63 y.o. female who presents to [facility] [place] today for annual physical exam  Current Concerns include:  She is currently following with NP Andreas Blower at weight loss clinic in [facility]. She is taking Phentermine 37.5 mg twice a day, Benzphetamine 50 mg daily and Saxenda 0.6 mg daily. Reports nausea with higher dose of Saxenda.  She is requesting a refill of Restoril for insomnia. She has an appt with her psychiatrist at the end of the month.  Fall Risk  02/13/2019  Falls in the past year? 0  Risk for fall due to : Orthopedic patient  Follow up Falls prevention discussed   Depression screen [facility] 2/9 05/16/2019 03/02/2018 02/11/2018 01/06/2018  Decreased Interest 0 0 3 1  Down, Depressed, Hopeless 1 0 3 3  PHQ - 2 Score 1 0 6 4  Altered sleeping 2 1 3 3   Tired, decreased energy 1 0 3 3  Change in appetite 2 1 2  0  Feeling bad or failure about yourself  1 0 2 0  Trouble concentrating 0 0 3 0  Moving slowly or fidgety/restless 0 0 2 0  Suicidal thoughts 0 0 0 0  PHQ-9 Score 7 2 21 10   Difficult doing work/chores - Not difficult at all Extremely dIfficult -     Health Maintenance Health Maintenance  Topic Date Due  . INFLUENZA VACCINE  07/15/2019  . MAMMOGRAM  03/09/2020  . COLONOSCOPY  03/27/2020  . PAP SMEAR-Modifier  06/03/2020  . TETANUS/TDAP  10/30/2025  . Hepatitis C Screening  Completed    Past Medical History:  Diagnosis Date  . Alcohol dependence ([place])   . Depression 07/17/2013  . History of hepatitis C 07/17/2013  . History of rheumatic fever   . Hyperlipidemia 07/17/2013  . Hypertension   . Tobacco use   . Toe fracture, right 10/07/2016  . Vitamin D deficiency 07/17/2013   Past Surgical History:  Procedure Laterality Date  . CESAREAN SECTION  1984  . FINGER SURGERY Right    5th  . GYNECOLOGIC CRYOSURGERY    . TONSILLECTOMY      Social History   Tobacco Use  . Smoking status: Former Smoker    Packs/day: 1.00    Years: 25.00    Pack years: 25.00    Types: Cigarettes    Last attempt to quit: 2012    Years since quitting: 8.4  . Smokeless tobacco: Current User  . Tobacco comment: <1 cig per month  Substance Use Topics  . Alcohol use: Yes    Alcohol/week: 24.0 - 36.0 standard drinks    Types: 24 - 36 Cans of beer per week   family history includes Cardiomyopathy in her father; Liver cancer in her brother; Ovarian cancer in her mother; Protein C deficiency in her brother and brother; Stroke in her brother; Valvular heart disease in her father.  ROS: negative except as noted in the HPI  Medications: Current Outpatient Medications  Medication Sig Dispense Refill  . acetaminophen (TYLENOL) 500 MG tablet Take 500 mg by mouth every 6 (six) hours  as needed.    Marland Kitchen amLODipine (NORVASC) 5 MG tablet Take 1 tablet (5 mg total) by mouth daily. 90 tablet 1  . ARIPiprazole (ABILIFY) 5 MG tablet Take 1 tablet (5 mg total) by mouth at bedtime. 30 tablet 5  . Ascorbic Acid (VITAMIN C PO) Take 1 tablet by mouth daily.    . ASPIRIN ADULT LOW STRENGTH 81 MG EC tablet TAKE 1 TABLET (81 MG TOTAL) BY MOUTH DAILY. 90 tablet 3  . buPROPion (WELLBUTRIN XL) 300 MG 24 hr tablet Take 1 tablet (300 mg total) by mouth daily. 90 tablet 1  . CALCIUM PO Take 1 tablet by mouth daily.    . celecoxib (CELEBREX) 200 MG capsule Take 1 capsule (200 mg total) by mouth 2 (two) times daily. 60 capsule 3  . Coenzyme Q10 (CO Q 10 PO) Take by mouth.    . Cyanocobalamin (VITAMIN B 12 PO) Take 1 tablet by mouth daily.    . Diclofenac Sodium (PENNSAID) 2 % SOLN Apply 2 g topically 2 (two) times daily as needed (to affected area). 112 g 3  . diclofenac sodium (VOLTAREN) 1 % GEL Apply 4 g topically 4 (four) times daily. To affected joint. 900 g 1  . gabapentin (NEURONTIN) 300 MG capsule 1  qam   4   qhs 150 capsule 5  . GLUCOSAMINE-CHONDROITIN PO Take 1  tablet by mouth daily.    . hydrochlorothiazide (HYDRODIURIL) 12.5 MG tablet Take 1 tablet (12.5 mg total) by mouth daily. 90 tablet 1  . HYDROcodone-acetaminophen (NORCO/VICODIN) 5-325 MG tablet Take 1 tablet by mouth every 6 (six) hours as needed. 15 tablet 0  . Nebivolol HCl 20 MG TABS Take 1 tablet (20 mg total) by mouth daily. 90 tablet 1  . Omega-3 Fatty Acids (FISH OIL PO) Take 1 tablet by mouth daily.    Marland Kitchen omeprazole (PRILOSEC) 20 MG capsule Take 1 capsule (20 mg total) by mouth daily. 90 capsule 1  . potassium chloride SA (K-DUR,KLOR-CON) 20 MEQ tablet Take 1 tablet (20 mEq total) by mouth daily. 90 tablet 1  . Prasterone (INTRAROSA) 6.5 MG INST Place 1 application vaginally 2 (two) times a week. 84 each 11  . PROAIR HFA 108 (90 Base) MCG/ACT inhaler INHALE 1 TO 2 PUFFS BY MOUTH INTO THE LUNGS EVERY 4 HOURS AS NEEDED FOR WHEEZING OR SHORTNESS OF BREATH 8.5 g 1  . simvastatin (ZOCOR) 20 MG tablet Take 1 tablet (20 mg total) by mouth daily. Due for labs 30 tablet 0  . VITAMIN E PO Take 1 tablet by mouth daily.     No current facility-administered medications for this visit.    Allergies  Allergen Reactions  . Metformin And Related Other (See Comments)    Lactic acidosis   . Penicillins Hives and Rash    Has patient had a PCN reaction causing immediate rash, facial/tongue/throat swelling, SOB or lightheadedness with hypotension: Yes Has patient had a PCN reaction causing severe rash involving mucus membranes or skin necrosis:No Has patient had a PCN reaction that required hospitalization: Yes Has patient had a PCN reaction occurring within the last 10 years: No If all of the above answers are "NO", then may proceed with Cephalosporin use.        Objective:  BP (!) 152/91   Pulse 74   Temp 97.6 F (36.4 C) (Oral)   Wt 197 lb (89.4 kg)   BMI 33.81 kg/m  Vitals:   05/16/19 1447  BP: (!) 152/91  Pulse:  74  Temp: 97.6 F (36.4 C)    General Appearance:  Alert,  cooperative, no distress, appropriate for age,                             Head:  Normocephalic, without obvious abnormality                             Eyes:  PERRL, EOM's intact, conjunctiva and cornea clear, wearing glasses                             Ears:  TM pearly gray color and semitransparent, external ear canals normal, both ears                            Nose:  Nares symmetrical, mucosa pink                          Throat:  Lips, tongue, and mucosa are moist, pink, and intact; oropharynx clear, uvula midline; good dentition                             Neck:  Supple; symmetrical, trachea midline, no adenopathy; thyroid: no enlargement, symmetric, no tenderness/mass/nodules                             Back:  Symmetrical, no curvature, ROM normal               Chest/Breast:  deferred                           Lungs:  Clear to auscultation bilaterally, respirations unlabored                             Heart:  normal rate & regular rhythm, S1 and S2 normal, no murmurs, rubs, or gallops                     Abdomen:  Soft, non-tender, no mass or organomegaly              Genitourinary:  deferred         Musculoskeletal:  Tone and strength strong and symmetrical, all extremities; no joint pain or edema, normal gait and station                                   Lymphatic:  No adenopathy             Skin/Hair/Nails:  Skin warm, dry and intact, no rashes or abnormal dyspigmentation on limited exam                   Neurologic:  Alert and oriented x3, no cranial nerve deficits, sensation grossly intact, normal gait and station, no tremor Psych: well-groomed, cooperative, good eye contact, euthymic mood, affect mood-congruent, speech is articulate, and thought processes clear and goal-directed  BP Readings from Last 3 Encounters:  05/16/19 121/78  05/02/19 137/80  03/21/19 (!) 147/92   Pulse Readings from Last 3 Encounters:  05/16/19 74  05/02/19 73  03/21/19 91   Wt Readings from Last  3 Encounters:  05/16/19 197 lb (89.4 kg)  05/02/19 197 lb 8 oz (89.6 kg)  03/21/19 200 lb (90.7 kg)      Assessment and Plan: 63 y.o. female with   .Cheyna was seen today for annual exam.  Diagnoses and all orders for this visit:  Encounter for annual physical exam -     CBC -     TSH + free T4 -     COMPLETE METABOLIC PANEL WITH GFR -     Magnesium -     Lipid Panel w/reflex Direct LDL  Other long term (current) drug therapy -     CBC -     TSH + free T4 -     COMPLETE METABOLIC PANEL WITH GFR -     Magnesium -     Lipid Panel w/reflex Direct LDL  Essential hypertension with goal blood pressure less than 140/90 -     TSH + free T4 -     COMPLETE METABOLIC PANEL WITH GFR  Hypomagnesemia -     Magnesium  Dyslipidemia, goal LDL below 100 -     Lipid Panel w/reflex Direct LDL  Insomnia secondary to anxiety -     temazepam (RESTORIL) 15 MG capsule; Take 1 capsule (15 mg total) by mouth at bedtime as needed for sleep.  Breast cancer screening by mammogram -     MM 3D SCREEN BREAST BILATERAL; Future   - Personally reviewed PMH, PSH, PFH, medications, allergies, HM - Age-appropriate cancer screening: colonoscopy UTD, Pap UTD, mammo pending - Tdap and age-appropriate immunizations UTD - PHQ9=7, keep follow-up with Psychiatry - Routine fasting labs pending  HTN - BP out of range in office today. Advised patient that she should reduce her Phentermine dose (prescried by weight mgmt). Advised that Phentermine 37.5 mg bid exceeds recommended therapeutic dosing and advised her to limit stimulant medication due to co-morbid HTN  Patient education and anticipatory guidance given Patient agrees with treatment plan Follow-up in 3 months for medication mgmt or sooner as needed  Darlyne Russian PA-C

## 2019-05-16 NOTE — Patient Instructions (Addendum)
Strongly recommend reducing Phentermine dose to 1/2 tablet twice a day  I would not exceed 37.5 mg daily. Risks outweigh benefit   Preventive Care 62-64 Years, Female Preventive care refers to lifestyle choices and visits with your health care provider that can promote health and wellness. What does preventive care include?   A yearly physical exam. This is also called an annual well check.  Dental exams once or twice a year.  Routine eye exams. Ask your health care provider how often you should have your eyes checked.  Personal lifestyle choices, including: ? Daily care of your teeth and gums. ? Regular physical activity. ? Eating a healthy diet. ? Avoiding tobacco and drug use. ? Limiting alcohol use. ? Practicing safe sex. ? Taking low-dose aspirin daily starting at age 19. ? Taking vitamin and mineral supplements as recommended by your health care provider. What happens during an annual well check? The services and screenings done by your health care provider during your annual well check will depend on your age, overall health, lifestyle risk factors, and family history of disease. Counseling Your health care provider may ask you questions about your:  Alcohol use.  Tobacco use.  Drug use.  Emotional well-being.  Home and relationship well-being.  Sexual activity.  Eating habits.  Work and work Statistician.  Method of birth control.  Menstrual cycle.  Pregnancy history. Screening You may have the following tests or measurements:  Height, weight, and BMI.  Blood pressure.  Lipid and cholesterol levels. These may be checked every 5 years, or more frequently if you are over 29 years old.  Skin check.  Lung cancer screening. You may have this screening every year starting at age 62 if you have a 30-pack-year history of smoking and currently smoke or have quit within the past 15 years.  Colorectal cancer screening. All adults should have this screening  starting at age 41 and continuing until age 62. Your health care provider may recommend screening at age 35. You will have tests every 1-10 years, depending on your results and the type of screening test. People at increased risk should start screening at an earlier age. Screening tests may include: ? Guaiac-based fecal occult blood testing. ? Fecal immunochemical test (FIT). ? Stool DNA test. ? Virtual colonoscopy. ? Sigmoidoscopy. During this test, a flexible tube with a tiny camera (sigmoidoscope) is used to examine your rectum and lower colon. The sigmoidoscope is inserted through your anus into your rectum and lower colon. ? Colonoscopy. During this test, a long, thin, flexible tube with a tiny camera (colonoscope) is used to examine your entire colon and rectum.  Hepatitis C blood test.  Hepatitis B blood test.  Sexually transmitted disease (STD) testing.  Diabetes screening. This is done by checking your blood sugar (glucose) after you have not eaten for a while (fasting). You may have this done every 1-3 years.  Mammogram. This may be done every 1-2 years. Talk to your health care provider about when you should start having regular mammograms. This may depend on whether you have a family history of breast cancer.  BRCA-related cancer screening. This may be done if you have a family history of breast, ovarian, tubal, or peritoneal cancers.  Pelvic exam and Pap test. This may be done every 3 years starting at age 62. Starting at age 19, this may be done every 5 years if you have a Pap test in combination with an HPV test.  Bone density scan. This is  done to screen for osteoporosis. You may have this scan if you are at high risk for osteoporosis. Discuss your test results, treatment options, and if necessary, the need for more tests with your health care provider. Vaccines Your health care provider may recommend certain vaccines, such as:  Influenza vaccine. This is recommended every  year.  Tetanus, diphtheria, and acellular pertussis (Tdap, Td) vaccine. You may need a Td booster every 10 years.  Varicella vaccine. You may need this if you have not been vaccinated.  Zoster vaccine. You may need this after age 62.  Measles, mumps, and rubella (MMR) vaccine. You may need at least one dose of MMR if you were born in 1957 or later. You may also need a second dose.  Pneumococcal 13-valent conjugate (PCV13) vaccine. You may need this if you have certain conditions and were not previously vaccinated.  Pneumococcal polysaccharide (PPSV23) vaccine. You may need one or two doses if you smoke cigarettes or if you have certain conditions.  Meningococcal vaccine. You may need this if you have certain conditions.  Hepatitis A vaccine. You may need this if you have certain conditions or if you travel or work in places where you may be exposed to hepatitis A.  Hepatitis B vaccine. You may need this if you have certain conditions or if you travel or work in places where you may be exposed to hepatitis B.  Haemophilus influenzae type b (Hib) vaccine. You may need this if you have certain conditions. Talk to your health care provider about which screenings and vaccines you need and how often you need them. This information is not intended to replace advice given to you by your health care provider. Make sure you discuss any questions you have with your health care provider. Document Released: 12/27/2015 Document Revised: 01/20/2018 Document Reviewed: 10/01/2015 Elsevier Interactive Patient Education  2019 [employer].

## 2019-05-17 ENCOUNTER — Other Ambulatory Visit: Payer: Self-pay | Admitting: Physician Assistant

## 2019-05-17 ENCOUNTER — Telehealth: Payer: Self-pay

## 2019-05-17 LAB — COMPLETE METABOLIC PANEL WITH GFR
AG Ratio: 1.8 (calc) (ref 1.0–2.5)
ALT: 13 U/L (ref 6–29)
AST: 15 U/L (ref 10–35)
Albumin: 4.7 g/dL (ref 3.6–5.1)
Alkaline phosphatase (APISO): 58 U/L (ref 37–153)
BUN/Creatinine Ratio: 17 (calc) (ref 6–22)
BUN: 19 mg/dL (ref 7–25)
CO2: 28 mmol/L (ref 20–32)
Calcium: 9.6 mg/dL (ref 8.6–10.4)
Chloride: 100 mmol/L (ref 98–110)
Creat: 1.12 mg/dL — ABNORMAL HIGH (ref 0.50–0.99)
GFR, Est African American: 61 mL/min/{1.73_m2} (ref >=60)
GFR, Est Non African American: 53 mL/min/{1.73_m2} — ABNORMAL LOW (ref >=60)
Globulin: 2.6 g/dL (calc) (ref 1.9–3.7)
Glucose, Bld: 93 mg/dL (ref 65–99)
Potassium: 4.8 mmol/L (ref 3.5–5.3)
Sodium: 136 mmol/L (ref 135–146)
Total Bilirubin: 0.4 mg/dL (ref 0.2–1.2)
Total Protein: 7.3 g/dL (ref 6.1–8.1)

## 2019-05-17 LAB — CBC
HCT: 38.1 % (ref 35.0–45.0)
Hemoglobin: 12.9 g/dL (ref 11.7–15.5)
MCH: 30.6 pg (ref 27.0–33.0)
MCHC: 33.9 g/dL (ref 32.0–36.0)
MCV: 90.5 fL (ref 80.0–100.0)
MPV: 11.2 fL (ref 7.5–12.5)
Platelets: 260 10*3/uL (ref 140–400)
RBC: 4.21 10*6/uL (ref 3.80–5.10)
RDW: 12.8 % (ref 11.0–15.0)
WBC: 8.1 10*3/uL (ref 3.8–10.8)

## 2019-05-17 LAB — LIPID PANEL W/REFLEX DIRECT LDL
Cholesterol: 179 mg/dL (ref <200)
HDL: 57 mg/dL (ref >=50)
LDL Cholesterol (Calc): 98 mg/dL (calc)
Non-HDL Cholesterol (Calc): 122 mg/dL (calc) (ref <130)
Total CHOL/HDL Ratio: 3.1 (calc) (ref <5.0)
Triglycerides: 137 mg/dL (ref <150)

## 2019-05-17 LAB — MAGNESIUM: Magnesium: 2 mg/dL (ref 1.5–2.5)

## 2019-05-17 LAB — TSH+FREE T4: TSH W/REFLEX TO FT4: 0.98 mIU/L (ref 0.40–4.50)

## 2019-05-17 MED ORDER — MELOXICAM 7.5 MG PO TABS: 7.5000 mg | ORAL_TABLET | Freq: Every day | ORAL | 2 refills | Status: DC | PRN

## 2019-05-17 MED FILL — MELOXICAM 7.5 MG TABLET: 7.5 | 30 days supply | Qty: 30 | Fill #0

## 2019-05-17 NOTE — Telephone Encounter (Signed)
PA required for Monovisc, left knee. Initiated PA with Ronalee Belts at [facility].  Reference# [id number]. Per Ronalee Belts, office notes needed to be faxed to [phone]. Faxed office notes to [phone].

## 2019-05-18 MED FILL — SIMVASTATIN 20 MG TABLET: 20 | 30 days supply | Qty: 30 | Fill #0

## 2019-05-19 MED FILL — ASPIRIN ADULT LOW STRENGTH: 81 | 90 days supply | Qty: 90 | Fill #1

## 2019-05-20 ENCOUNTER — Encounter: Payer: Self-pay | Admitting: Orthopaedic Surgery

## 2019-05-22 ENCOUNTER — Telehealth: Payer: Self-pay

## 2019-05-22 NOTE — Telephone Encounter (Signed)
Called and left a VM for patient to CB to schedule an appointment for gel injection with Dr. Erlinda Hong.  Approved for Monovisc, left knee. [place] Patient will be responsible for 20% of the allowable amount. No Co-pay PA required PA Approval# [id number] Valid 05/19/2019- 06/18/2019

## 2019-05-30 ENCOUNTER — Encounter: Payer: Self-pay | Admitting: Physician Assistant

## 2019-05-30 NOTE — Progress Notes (Signed)
Recheck BP taken 05/16/2019

## 2019-06-01 ENCOUNTER — Encounter: Payer: Self-pay | Admitting: Family Medicine

## 2019-06-02 MED FILL — GABAPENTIN 300 MG CAPSULE: 300 | 30 days supply | Qty: 150 | Fill #0

## 2019-06-02 MED FILL — BYSTOLIC 20 MG TABLET: 20 | 90 days supply | Qty: 90 | Fill #1

## 2019-06-02 NOTE — Telephone Encounter (Signed)
To Dr. Georgina Snell

## 2019-06-06 ENCOUNTER — Ambulatory Visit (INDEPENDENT_AMBULATORY_CARE_PROVIDER_SITE_OTHER): Payer: No Typology Code available for payment source | Admitting: Orthopaedic Surgery

## 2019-06-06 ENCOUNTER — Other Ambulatory Visit: Payer: Self-pay

## 2019-06-06 ENCOUNTER — Encounter: Payer: Self-pay | Admitting: Orthopaedic Surgery

## 2019-06-06 DIAGNOSIS — M1712 Unilateral primary osteoarthritis, left knee: Secondary | ICD-10-CM

## 2019-06-06 MED ORDER — HYALURONAN 88 MG/4ML IX SOSY
88.0000 mg | PREFILLED_SYRINGE | INTRA_ARTICULAR | Status: AC | PRN
  Administered 2019-06-06: 15:00:00 88 mg via INTRA_ARTICULAR

## 2019-06-06 NOTE — Progress Notes (Signed)
   Procedure Note  Patient: Mary Stark             Date of Birth: August 11, 1956           MRN: [id number]             Visit Date: 06/06/2019  Procedures: Visit Diagnoses: No diagnosis found.  Large Joint Inj: L knee on 06/06/2019 2:54 PM Indications: pain Details: 22 G needle  Arthrogram: No  Medications: 88 mg Hyaluronan 88 MG/4ML Outcome: tolerated well, no immediate complications Patient was prepped and draped in the usual sterile fashion.

## 2019-06-07 ENCOUNTER — Ambulatory Visit (INDEPENDENT_AMBULATORY_CARE_PROVIDER_SITE_OTHER): Payer: No Typology Code available for payment source | Admitting: Psychiatry

## 2019-06-07 ENCOUNTER — Ambulatory Visit: Payer: No Typology Code available for payment source

## 2019-06-07 DIAGNOSIS — F324 Major depressive disorder, single episode, in partial remission: Secondary | ICD-10-CM

## 2019-06-07 DIAGNOSIS — F418 Other specified anxiety disorders: Secondary | ICD-10-CM

## 2019-06-07 DIAGNOSIS — F3341 Major depressive disorder, recurrent, in partial remission: Secondary | ICD-10-CM | POA: Diagnosis not present

## 2019-06-07 MED ORDER — ARIPIPRAZOLE 5 MG PO TABS: 5.0000 mg | ORAL_TABLET | Freq: Every day | ORAL | 5 refills | Status: DC

## 2019-06-07 MED ORDER — TEMAZEPAM 15 MG PO CAPS: ORAL_CAPSULE | ORAL | 4 refills | Status: DC

## 2019-06-07 MED ORDER — BUPROPION HCL ER (XL) 300 MG PO TB24: 300.0000 mg | ORAL_TABLET | Freq: Every day | ORAL | 1 refills | Status: DC

## 2019-06-07 MED ORDER — GABAPENTIN 300 MG PO CAPS: ORAL_CAPSULE | ORAL | 5 refills | Status: DC

## 2019-06-07 MED FILL — TEMAZEPAM 15 MG CAPSULE: 15 | 30 days supply | Qty: 60 | Fill #0

## 2019-06-07 MED FILL — buPROPion HCL ER (XL) 300 M: 300 | 90 days supply | Qty: 90 | Fill #0

## 2019-06-07 MED FILL — ARIPiprazole 5 MG TABS: 5 | 30 days supply | Qty: 30 | Fill #0

## 2019-06-07 NOTE — Progress Notes (Signed)
Psychiatric Initial Adult Assessment   Patient Identification: Mary Stark MRN:  [id number] Date of Evaluation:  06/07/2019 Referral Source: Dr. Sherlie Ban Chief Complaint:  Anxiety.  I think come down. Visit Diagnosis: Major clinical depression Today the patient is doing well.  She is stable.  She is at her baseline.  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  She not particularly happy with her daughter but overall she is functioning well.  The patient's husband is doing very well.  He is very active.  Throughout the days during the week he is not home much for the grandchildren but he goes to his own garden.  The patient is sleeping and eating fairly well.  She is had some problems with her knee.  She is probably could have microsurgery.  Financially she is stable.  She denies chest pain or shortness of breath or any symptoms or respiratory symptoms.  She believes the Neurontin has helped her anxiety a great deal.  She processes less.  She is able to leave things alone.  Remarkably also her alcohol is just about stopped.  The patient has asked her return of her Restoril as it did help her sleep and she does visit.  Overall she is doing very well.  She is functioning very well.  She is not fatigued.     ICD-10-CM   1. Recurrent major depressive disorder, in partial remission (HCC)  F33.41 buPROPion (WELLBUTRIN XL) 300 MG 24 hr tablet  2. Depression with anxiety  F41.8 ARIPiprazole (ABILIFY) 5 MG tablet    History of Present Illness:     Associated Signs/Symptoms: Depression Symptoms:  feelings of worthlessness/guilt, (Hypo) Manic Symptoms:   Anxiety Symptoms:   Psychotic Symptoms:   PTSD Symptoms:   Past Psychiatric History: multiple presents now taking Abilify 10 mg Wellbutrin  300 mg  Previous Psychotropic Medications: as above  Substance Abuse History in the last 12 months:  Yes.    Consequences of Substance  Abuse: Medical Consequences:    Past Medical History:  Past Medical History:  Diagnosis Date  . Alcohol dependence ([place])   . Depression 07/17/2013  . History of hepatitis C 07/17/2013  . History of rheumatic fever   . Hyperlipidemia 07/17/2013  . Hypertension   . Tobacco use   . Toe fracture, right 10/07/2016  . Vitamin D deficiency 07/17/2013    Past Surgical History:  Procedure Laterality Date  . CESAREAN SECTION  1984  . FINGER SURGERY Right    5th  . GYNECOLOGIC CRYOSURGERY    . TONSILLECTOMY      Family Psychiatric History:   Family History:  Family History  Problem Relation Age of Onset  . Ovarian cancer Mother   . Cardiomyopathy Father   . Valvular heart disease Father   . Liver cancer Brother   . Protein C deficiency Brother   . Protein C deficiency Brother   . Stroke Brother     Social History:   Social History   Socioeconomic History  . Marital status: Married    Spouse name: Not on file  . Number of children: Not on file  . Years of education: Not on file  . Highest education level: Not on file  Occupational History  . Not on file  Social Needs  . Financial resource strain: Not on file  . Food insecurity    Worry: Not on file    Inability: Not on file  .  Transportation needs    Medical: Not on file    Non-medical: Not on file  Tobacco Use  . Smoking status: Former Smoker    Packs/day: 1.00    Years: 25.00    Pack years: 25.00    Types: Cigarettes    Quit date: 2012    Years since quitting: 8.4  . Smokeless tobacco: Current User  . Tobacco comment: <1 cig per month  Substance and Sexual Activity  . Alcohol use: Yes    Alcohol/week: 24.0 - 36.0 standard drinks    Types: 24 - 36 Cans of beer per week  . Drug use: No  . Sexual activity: Yes    Birth control/protection: None  Lifestyle  . Physical activity    Days per week: Not on file    Minutes per session: Not on file  . Stress: Not on file  Relationships  . Social Product manager on phone: Not on file    Gets together: Not on file    Attends religious service: Not on file    Active member of club or organization: Not on file    Attends meetings of clubs or organizations: Not on file    Relationship status: Not on file  Other Topics Concern  . Not on file  Social History Narrative  . Not on file    Additional Social History:   Allergies:   Allergies  Allergen Reactions  . Metformin And Related Other (See Comments)    Lactic acidosis   . Celebrex [Celecoxib] Other (See Comments)    Dizziness, "makes me feel bad"  . Penicillins Hives and Rash    Has patient had a PCN reaction causing immediate rash, facial/tongue/throat swelling, SOB or lightheadedness with hypotension: Yes Has patient had a PCN reaction causing severe rash involving mucus membranes or skin necrosis:No Has patient had a PCN reaction that required hospitalization: Yes Has patient had a PCN reaction occurring within the last 10 years: No If all of the above answers are "NO", then may proceed with Cephalosporin use.     Metabolic Disorder Labs: Lab Results  Component Value Date   HGBA1C 5.6 02/07/2013   No results found for: PROLACTIN Lab Results  Component Value Date   CHOL 179 05/16/2019   TRIG 137 05/16/2019   HDL 57 05/16/2019   CHOLHDL 3.1 05/16/2019   VLDL 20 08/30/2013   LDLCALC 98 05/16/2019   LDLCALC 84 01/10/2018     Current Medications: Current Outpatient Medications  Medication Sig Dispense Refill  . acetaminophen (TYLENOL) 500 MG tablet Take 500 mg by mouth every 6 (six) hours as needed.    Marland Kitchen amLODipine (NORVASC) 5 MG tablet Take 1 tablet (5 mg total) by mouth daily. 90 tablet 1  . ARIPiprazole (ABILIFY) 5 MG tablet Take 1 tablet (5 mg total) by mouth at bedtime. 30 tablet 5  . Ascorbic Acid (VITAMIN C PO) Take 1 tablet by mouth daily.    . ASPIRIN ADULT LOW STRENGTH 81 MG EC tablet TAKE 1 TABLET (81 MG TOTAL) BY MOUTH DAILY. 90 tablet 3  . Benzphetamine  HCl 50 MG TABS Take 50 mg by mouth daily.    Marland Kitchen buPROPion (WELLBUTRIN XL) 300 MG 24 hr tablet Take 1 tablet (300 mg total) by mouth daily. 90 tablet 1  . CALCIUM PO Take 1 tablet by mouth daily.    . Coenzyme Q10 (CO Q 10 PO) Take by mouth.    . Cyanocobalamin (VITAMIN B 12  PO) Take 1 tablet by mouth daily.    . Diclofenac Sodium (PENNSAID) 2 % SOLN Apply 2 g topically 2 (two) times daily as needed (to affected area). 112 g 3  . gabapentin (NEURONTIN) 300 MG capsule 1  qam   4   qhs 150 capsule 5  . GLUCOSAMINE-CHONDROITIN PO Take 1 tablet by mouth daily.    . hydrochlorothiazide (HYDRODIURIL) 12.5 MG tablet Take 1 tablet (12.5 mg total) by mouth daily. 90 tablet 1  . HYDROcodone-acetaminophen (NORCO/VICODIN) 5-325 MG tablet Take 1 tablet by mouth every 6 (six) hours as needed. 15 tablet 0  . meloxicam (MOBIC) 7.5 MG tablet Take 1 tablet (7.5 mg total) by mouth daily as needed for pain. 30 tablet 2  . Nebivolol HCl 20 MG TABS Take 1 tablet (20 mg total) by mouth daily. 90 tablet 1  . Omega-3 Fatty Acids (FISH OIL PO) Take 1 tablet by mouth daily.    Marland Kitchen omeprazole (PRILOSEC) 20 MG capsule Take 1 capsule (20 mg total) by mouth daily. 90 capsule 1  . phentermine (ADIPEX-P) 37.5 MG tablet Take 37.5 mg by mouth 2 (two) times a day.    . potassium chloride SA (K-DUR,KLOR-CON) 20 MEQ tablet Take 1 tablet (20 mEq total) by mouth daily. 90 tablet 1  . Prasterone (INTRAROSA) 6.5 MG INST Place 1 application vaginally 2 (two) times a week. 84 each 11  . PROAIR HFA 108 (90 Base) MCG/ACT inhaler INHALE 1 TO 2 PUFFS BY MOUTH INTO THE LUNGS EVERY 4 HOURS AS NEEDED FOR WHEEZING OR SHORTNESS OF BREATH 8.5 g 1  . SAXENDA 18 MG/3ML SOPN Inject 0.6 mg into the skin daily.    . simvastatin (ZOCOR) 20 MG tablet Take 1 tablet (20 mg total) by mouth daily. Due for labs 30 tablet 0  . temazepam (RESTORIL) 15 MG capsule Take 1 capsule (15 mg total) by mouth at bedtime as needed for sleep. 30 capsule 0  . temazepam  (RESTORIL) 15 MG capsule 1  qhs   May  Take 2 qhs 60 capsule 4  . VITAMIN E PO Take 1 tablet by mouth daily.     No current facility-administered medications for this visit.     Neurologic: Headache: No Seizure: No Paresthesias:No  Musculoskeletal: Strength & Muscle Tone: within normal limits Gait & Station: normal Patient leans: N/A  Psychiatric Specialty Exam: ROS  There were no vitals taken for this visit.There is no height or weight on file to calculate BMI.  General Appearance: Casual  Eye Contact:  Good  Speech:  NA  Volume:  Normal  Mood:  Negative  Affect:  Appropriate  Thought Process:  Goal Directed  Orientation:  Full (Time, Place, and Person)  Thought Content:  Logical  Suicidal Thoughts:  No  Homicidal Thoughts:  No  Memory:  NA Immediate;   Good  Judgement:  Good  Insight:  Good  Psychomotor Activity:  NA  Concentration:    Recall:  Good  Fund of Knowledge:Good  Language: Good  Akathisia:  No  Handed:  Right  AIMS (if indicated):    Assets:  Desire for Improvement  ADL's:  Intact  Cognition: WNL  Sleep:      Treatment Plan Summary: At this time the patient is doing well.  Her first problem is that of major depression.  She takes Wellbutrin 300 mg and Abilify 5 mg.  She is been on the Abilify 5 mg since 2008.  On her next visit we will attempt  to reduce her Abilify down to 2 mg.  For now she will continue taking Wellbutrin and Abilify as ordered.  Her second problem is that of alcohol use disorder.  At this time she will continue taking Neurontin which I think is beneficial for her anxiety.  She will continue taking 300 mg 1 in the morning and 4 at night.  It also helps her sleep.  We will go ahead and restart her Restoril 15 mg 1 or 2 at night.  In essence that her third problem is that of insomnia.  This patient to return to see me in 3 months.  Jerral Ralph, MD 6/24/20201:43 PM

## 2019-06-09 ENCOUNTER — Telehealth: Payer: Self-pay | Admitting: Physician Assistant

## 2019-06-09 NOTE — Telephone Encounter (Signed)
Left vm for pt to return call to clinic -EH/RMA  

## 2019-06-09 NOTE — Telephone Encounter (Signed)
Received forms from [employer] for FMLA Please call patient and schedule e-visit for form completion. If this is for knee pain diagnosis, schedule with Dr. Georgina Snell. If this is for non-ortho medical problem, schedule with Evlyn Clines

## 2019-06-09 NOTE — Telephone Encounter (Signed)
Left a message for patient to schedule a virtual appointment with Evlyn Clines or Georgina Snell depending on why she needs FMLA.

## 2019-06-09 NOTE — Telephone Encounter (Signed)
Recommendations left on vm -EH/RMA  

## 2019-06-13 ENCOUNTER — Telehealth (INDEPENDENT_AMBULATORY_CARE_PROVIDER_SITE_OTHER): Payer: No Typology Code available for payment source | Admitting: Family Medicine

## 2019-06-13 ENCOUNTER — Encounter: Payer: Self-pay | Admitting: Family Medicine

## 2019-06-13 VITALS — BP 125/85 | HR 82 | Ht 66.0 in | Wt 195.0 lb

## 2019-06-13 DIAGNOSIS — S83242A Other tear of medial meniscus, current injury, left knee, initial encounter: Secondary | ICD-10-CM

## 2019-06-13 DIAGNOSIS — M25562 Pain in left knee: Secondary | ICD-10-CM | POA: Diagnosis not present

## 2019-06-13 NOTE — Progress Notes (Signed)
Virtual Visit  via Video Note  I connected with      Mary Stark by a video enabled telemedicine application and verified that I am speaking with the correct person using two identifiers.   I discussed the limitations of evaluation and management by telemedicine and the availability of in person appointments. The patient expressed understanding and agreed to proceed.  History of Present Illness: Mary Stark is a 63 y.o. female who would like to discuss follow-up knee pain.  Patient was seen several times for left knee pain recently in March and in May.  She eventually did MRI which showed degenerative change as well as meniscus tear.  She missed several days in May and ultimately after failing conservative management was referred to orthopedics for further evaluation and treatment.  She has had several continued conservative management attempts including most recently hyaluronic acid injection which has not provided much benefit.  She notes that there is some consideration to proceed with surgery.   Observations/Objective: BP 125/85   Pulse 82   Ht 5\' 6"  (1.676 m)   Wt 195 lb (88.5 kg)   BMI 31.47 kg/m  Wt Readings from Last 5 Encounters:  06/13/19 195 lb (88.5 kg)  05/16/19 197 lb (89.4 kg)  05/02/19 197 lb 8 oz (89.6 kg)  03/21/19 200 lb (90.7 kg)  02/27/19 208 lb 1.9 oz (94.4 kg)   Exam:  Normal Speech.    Lab and Radiology Results No results found for this or any previous visit (from the past 72 hour(s)). No results found.   Assessment and Plan: 63 y.o. female with knee pain.  Missed days will be accounted for with FMLA form.  Discussed treatment options.  If no benefit after a few weeks of hyaluronic acid injection next it is reasonable to proceed with surgery.  Agree with orthopedic surgery at that point.  Continue conservative management and recheck as needed.  PDMP not reviewed this encounter. No orders of the defined types were placed in this  encounter.  No orders of the defined types were placed in this encounter.   Follow Up Instructions:    I discussed the assessment and treatment plan with the patient. The patient was provided an opportunity to ask questions and all were answered. The patient agreed with the plan and demonstrated an understanding of the instructions.   The patient was advised to call back or seek an in-person evaluation if the symptoms worsen or if the condition fails to improve as anticipated.  Time: 15 minutes of intraservice time, with >22 minutes of total time during today's visit.      Historical information moved to improve visibility of documentation.  Past Medical History:  Diagnosis Date  . Alcohol dependence ([place])   . Depression 07/17/2013  . History of hepatitis C 07/17/2013  . History of rheumatic fever   . Hyperlipidemia 07/17/2013  . Hypertension   . Tobacco use   . Toe fracture, right 10/07/2016  . Vitamin D deficiency 07/17/2013   Past Surgical History:  Procedure Laterality Date  . CESAREAN SECTION  1984  . FINGER SURGERY Right    5th  . GYNECOLOGIC CRYOSURGERY    . TONSILLECTOMY     Social History   Tobacco Use  . Smoking status: Former Smoker    Packs/day: 1.00    Years: 25.00    Pack years: 25.00    Types: Cigarettes    Quit date: 2012    Years since quitting:  8.5  . Smokeless tobacco: Current User  . Tobacco comment: <1 cig per month  Substance Use Topics  . Alcohol use: Yes    Alcohol/week: 24.0 - 36.0 standard drinks    Types: 24 - 36 Cans of beer per week   family history includes Cardiomyopathy in her father; Liver cancer in her brother; Ovarian cancer in her mother; Protein C deficiency in her brother and brother; Stroke in her brother; Valvular heart disease in her father.  Medications: Current Outpatient Medications  Medication Sig Dispense Refill  . acetaminophen (TYLENOL) 500 MG tablet Take 500 mg by mouth every 6 (six) hours as needed.    Marland Kitchen amLODipine  (NORVASC) 5 MG tablet Take 1 tablet (5 mg total) by mouth daily. 90 tablet 1  . ARIPiprazole (ABILIFY) 5 MG tablet Take 1 tablet (5 mg total) by mouth at bedtime. 30 tablet 5  . Ascorbic Acid (VITAMIN C PO) Take 1 tablet by mouth daily.    . ASPIRIN ADULT LOW STRENGTH 81 MG EC tablet TAKE 1 TABLET (81 MG TOTAL) BY MOUTH DAILY. 90 tablet 3  . Benzphetamine HCl 50 MG TABS Take 50 mg by mouth daily.    Marland Kitchen buPROPion (WELLBUTRIN XL) 300 MG 24 hr tablet Take 1 tablet (300 mg total) by mouth daily. 90 tablet 1  . CALCIUM PO Take 1 tablet by mouth daily.    . Coenzyme Q10 (CO Q 10 PO) Take by mouth.    . Cyanocobalamin (VITAMIN B 12 PO) Take 1 tablet by mouth daily.    . Diclofenac Sodium (PENNSAID) 2 % SOLN Apply 2 g topically 2 (two) times daily as needed (to affected area). 112 g 3  . gabapentin (NEURONTIN) 300 MG capsule 1  qam   4   qhs 150 capsule 5  . GLUCOSAMINE-CHONDROITIN PO Take 1 tablet by mouth daily.    . hydrochlorothiazide (HYDRODIURIL) 12.5 MG tablet Take 1 tablet (12.5 mg total) by mouth daily. 90 tablet 1  . meloxicam (MOBIC) 7.5 MG tablet Take 1 tablet (7.5 mg total) by mouth daily as needed for pain. 30 tablet 2  . Nebivolol HCl 20 MG TABS Take 1 tablet (20 mg total) by mouth daily. 90 tablet 1  . Omega-3 Fatty Acids (FISH OIL PO) Take 1 tablet by mouth daily.    Marland Kitchen omeprazole (PRILOSEC) 20 MG capsule Take 1 capsule (20 mg total) by mouth daily. 90 capsule 1  . phentermine (ADIPEX-P) 37.5 MG tablet Take 37.5 mg by mouth 2 (two) times a day.    . potassium chloride SA (K-DUR,KLOR-CON) 20 MEQ tablet Take 1 tablet (20 mEq total) by mouth daily. 90 tablet 1  . Prasterone (INTRAROSA) 6.5 MG INST Place 1 application vaginally 2 (two) times a week. 84 each 11  . PROAIR HFA 108 (90 Base) MCG/ACT inhaler INHALE 1 TO 2 PUFFS BY MOUTH INTO THE LUNGS EVERY 4 HOURS AS NEEDED FOR WHEEZING OR SHORTNESS OF BREATH 8.5 g 1  . SAXENDA 18 MG/3ML SOPN Inject 0.6 mg into the skin daily.    . simvastatin  (ZOCOR) 20 MG tablet Take 1 tablet (20 mg total) by mouth daily. Due for labs 30 tablet 0  . temazepam (RESTORIL) 15 MG capsule Take 1 capsule (15 mg total) by mouth at bedtime as needed for sleep. 30 capsule 0  . temazepam (RESTORIL) 15 MG capsule 1  qhs   May  Take 2 qhs 60 capsule 4  . VITAMIN E PO Take 1 tablet by  mouth daily.     No current facility-administered medications for this visit.    Allergies  Allergen Reactions  . Metformin And Related Other (See Comments)    Lactic acidosis   . Celebrex [Celecoxib] Other (See Comments)    Dizziness, "makes me feel bad"  . Penicillins Hives and Rash    Has patient had a PCN reaction causing immediate rash, facial/tongue/throat swelling, SOB or lightheadedness with hypotension: Yes Has patient had a PCN reaction causing severe rash involving mucus membranes or skin necrosis:No Has patient had a PCN reaction that required hospitalization: Yes Has patient had a PCN reaction occurring within the last 10 years: No If all of the above answers are "NO", then may proceed with Cephalosporin use.

## 2019-06-13 NOTE — Progress Notes (Signed)
FMLA. Filling out for time she missed prior to seeing Dr Sarina Ser (May 3, 15, 17)   Meloxicam QD

## 2019-06-20 MED FILL — AMLODIPINE BESYLATE 5 MG TA: 5 | 90 days supply | Qty: 90 | Fill #1

## 2019-06-21 ENCOUNTER — Telehealth: Payer: Self-pay | Admitting: Orthopaedic Surgery

## 2019-06-21 NOTE — Telephone Encounter (Signed)
Can take up to 6 weeks.  Ice/elevate and take otc meds as needed.

## 2019-06-21 NOTE — Telephone Encounter (Signed)
Patient states she's still having stabbing pain in her left knee and wanted to know how long does it take for the injection ( monovisc on 06/06/19) to take affect.

## 2019-06-21 NOTE — Telephone Encounter (Signed)
Please advise. 2 weeks post injection.

## 2019-06-22 NOTE — Telephone Encounter (Signed)
I left voicemail for patient advising. 

## 2019-06-28 ENCOUNTER — Other Ambulatory Visit: Payer: Self-pay | Admitting: Physician Assistant

## 2019-06-28 ENCOUNTER — Other Ambulatory Visit: Payer: Self-pay

## 2019-06-28 ENCOUNTER — Ambulatory Visit (INDEPENDENT_AMBULATORY_CARE_PROVIDER_SITE_OTHER): Payer: No Typology Code available for payment source

## 2019-06-28 DIAGNOSIS — Z1231 Encounter for screening mammogram for malignant neoplasm of breast: Secondary | ICD-10-CM

## 2019-06-28 MED FILL — OMEPRAZOLE 20 MG CPDR: 20 | 90 days supply | Qty: 90 | Fill #0

## 2019-06-28 MED FILL — SIMVASTATIN 20 MG TABLET: 20 | 90 days supply | Qty: 90 | Fill #0

## 2019-06-30 MED FILL — GABAPENTIN 300 MG CAPSULE: 300 | 30 days supply | Qty: 150 | Fill #1

## 2019-06-30 MED FILL — AMLODIPINE BESYLATE 5 MG TA: 5 | 90 days supply | Qty: 90 | Fill #1

## 2019-07-03 MED FILL — INTRAROSA 6.5 MG VAG INSERT: 6.5 | 28 days supply | Qty: 28 | Fill #0

## 2019-07-07 MED FILL — TEMAZEPAM 15 MG CAPSULE: 15 | 30 days supply | Qty: 60 | Fill #1

## 2019-07-07 MED FILL — MELOXICAM 7.5 MG TABLET: 7.5 | 30 days supply | Qty: 30 | Fill #1

## 2019-07-10 ENCOUNTER — Ambulatory Visit: Payer: No Typology Code available for payment source | Admitting: Physician Assistant

## 2019-07-13 ENCOUNTER — Encounter: Payer: Self-pay | Admitting: Physician Assistant

## 2019-07-13 ENCOUNTER — Ambulatory Visit (INDEPENDENT_AMBULATORY_CARE_PROVIDER_SITE_OTHER): Payer: No Typology Code available for payment source | Admitting: Physician Assistant

## 2019-07-13 VITALS — BP 129/78 | HR 68 | Ht 66.0 in | Wt 191.0 lb

## 2019-07-13 DIAGNOSIS — M25562 Pain in left knee: Secondary | ICD-10-CM

## 2019-07-13 DIAGNOSIS — M170 Bilateral primary osteoarthritis of knee: Secondary | ICD-10-CM | POA: Insufficient documentation

## 2019-07-13 DIAGNOSIS — M1712 Unilateral primary osteoarthritis, left knee: Secondary | ICD-10-CM | POA: Diagnosis not present

## 2019-07-13 DIAGNOSIS — S83242A Other tear of medial meniscus, current injury, left knee, initial encounter: Secondary | ICD-10-CM | POA: Insufficient documentation

## 2019-07-13 DIAGNOSIS — S83242D Other tear of medial meniscus, current injury, left knee, subsequent encounter: Secondary | ICD-10-CM

## 2019-07-13 DIAGNOSIS — N289 Disorder of kidney and ureter, unspecified: Secondary | ICD-10-CM | POA: Insufficient documentation

## 2019-07-13 DIAGNOSIS — M23352 Other meniscus derangements, posterior horn of lateral meniscus, left knee: Secondary | ICD-10-CM | POA: Insufficient documentation

## 2019-07-13 DIAGNOSIS — G8929 Other chronic pain: Secondary | ICD-10-CM

## 2019-07-13 DIAGNOSIS — Z791 Long term (current) use of non-steroidal anti-inflammatories (NSAID): Secondary | ICD-10-CM

## 2019-07-13 DIAGNOSIS — Z5181 Encounter for therapeutic drug level monitoring: Secondary | ICD-10-CM | POA: Insufficient documentation

## 2019-07-13 MED ORDER — TRAMADOL HCL 50 MG PO TABS: 50.0000 mg | ORAL_TABLET | Freq: Three times a day (TID) | ORAL | 0 refills | Status: DC | PRN

## 2019-07-13 MED FILL — traMADol HCL 50 MG TABS: 50 | 10 days supply | Qty: 30 | Fill #0

## 2019-07-13 NOTE — Progress Notes (Addendum)
Virtual Visit via Telephone Note  I connected with Mary Stark on 07/13/19 at  1:00 PM EDT by a video enabled telemedicine application and verified that I am speaking with the correct person using two identifiers.   I discussed the limitations of evaluation and management by telemedicine and the availability of in person appointments. The patient expressed understanding and agreed to proceed.  History of Present Illness: HPI:                                                                Mary Stark is a 63 y.o. female   CC: L knee pain  Patient with known left knee OA and medial/lateral meniscus tears presents today to discuss her ongoing treatment plan. She wakes up nightly with knee pain.  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 She states she is in severe pain at work and will have stiffness and a limping gait for several days after her shifts that gradually improves with rest. She reports intermittent sharp, stabbing pain on her medial knee that is worse with activity She had a hyaluronic acid injection with Dr. Erlinda Hong approx 2 months ago, which she states helped with the daily persistent pain, but did not help with the intermittent stabbing pain. She is currently using Pennsaid 2% 1-2 times daily, Meloxicam 7.5 mg daily and Tylenol Arthritis 1300 mg every 8 hours and still experiencing breakthrough pain.      Past Medical History:  Diagnosis Date  . Alcohol dependence ([place])   . Depression 07/17/2013  . History of hepatitis C 07/17/2013  . History of rheumatic fever   . Hyperlipidemia 07/17/2013  . Hypertension   . Tobacco use   . Toe fracture, right 10/07/2016  . Vitamin D deficiency 07/17/2013   Past Surgical History:  Procedure Laterality Date  . CESAREAN SECTION  1984  . FINGER SURGERY Right    5th  . GYNECOLOGIC CRYOSURGERY    . TONSILLECTOMY     Social History   Tobacco Use  . Smoking status: Former Smoker    Packs/day:  1.00    Years: 25.00    Pack years: 25.00    Types: Cigarettes    Quit date: 2012    Years since quitting: 8.5  . Smokeless tobacco: Current User  . Tobacco comment: <1 cig per month  Substance Use Topics  . Alcohol use: Yes    Alcohol/week: 24.0 - 36.0 standard drinks    Types: 24 - 36 Cans of beer per week   family history includes Cardiomyopathy in her father; Liver cancer in her brother; Ovarian cancer in her mother; Protein C deficiency in her brother and brother; Stroke in her brother; Valvular heart disease in her father.    ROS: negative except as noted in the HPI  Medications: Current Outpatient Medications  Medication Sig Dispense Refill  . acetaminophen (TYLENOL) 500 MG tablet Take 500 mg by mouth every 8 (eight) hours as needed.     Marland Kitchen amLODipine (NORVASC) 5 MG tablet Take 1 tablet (5 mg total) by mouth daily. 90 tablet 1  . ARIPiprazole (ABILIFY) 5 MG tablet Take 1 tablet (5 mg total) by mouth at bedtime. 30 tablet 5  . Ascorbic Acid (VITAMIN  C PO) Take 1 tablet by mouth daily.    . ASPIRIN ADULT LOW STRENGTH 81 MG EC tablet TAKE 1 TABLET (81 MG TOTAL) BY MOUTH DAILY. 90 tablet 3  . Benzphetamine HCl 50 MG TABS Take 50 mg by mouth daily.    Marland Kitchen buPROPion (WELLBUTRIN XL) 300 MG 24 hr tablet Take 1 tablet (300 mg total) by mouth daily. 90 tablet 1  . CALCIUM PO Take 1 tablet by mouth daily.    . Coenzyme Q10 (CO Q 10 PO) Take by mouth.    . Cyanocobalamin (VITAMIN B 12 PO) Take 1 tablet by mouth daily.    . Diclofenac Sodium (PENNSAID) 2 % SOLN Apply 2 g topically 2 (two) times daily as needed (to affected area). 112 g 3  . gabapentin (NEURONTIN) 300 MG capsule 1  qam   4   qhs 150 capsule 5  . GLUCOSAMINE-CHONDROITIN PO Take 1 tablet by mouth daily.    . hydrochlorothiazide (HYDRODIURIL) 12.5 MG tablet Take 1 tablet (12.5 mg total) by mouth daily. 90 tablet 1  . meloxicam (MOBIC) 7.5 MG tablet Take 1 tablet (7.5 mg total) by mouth daily as needed for pain. 30 tablet 2  .  Nebivolol HCl 20 MG TABS Take 1 tablet (20 mg total) by mouth daily. 90 tablet 1  . Omega-3 Fatty Acids (FISH OIL PO) Take 1 tablet by mouth daily.    Marland Kitchen omeprazole (PRILOSEC) 20 MG capsule Take 1 capsule (20 mg total) by mouth daily. 90 capsule 1  . phentermine (ADIPEX-P) 37.5 MG tablet Take 37.5 mg by mouth 2 (two) times a day.    . potassium chloride SA (K-DUR,KLOR-CON) 20 MEQ tablet Take 1 tablet (20 mEq total) by mouth daily. 90 tablet 1  . Prasterone (INTRAROSA) 6.5 MG INST Place 1 application vaginally 2 (two) times a week. 84 each 11  . PROAIR HFA 108 (90 Base) MCG/ACT inhaler INHALE 1 TO 2 PUFFS BY MOUTH INTO THE LUNGS EVERY 4 HOURS AS NEEDED FOR WHEEZING OR SHORTNESS OF BREATH 8.5 g 1  . SAXENDA 18 MG/3ML SOPN Inject 0.6 mg into the skin daily.    . simvastatin (ZOCOR) 20 MG tablet Take 1 tablet (20 mg total) by mouth daily. Due for labs 90 tablet 1  . temazepam (RESTORIL) 15 MG capsule Take 1 capsule (15 mg total) by mouth at bedtime as needed for sleep. 30 capsule 0  . VITAMIN E PO Take 1 tablet by mouth daily.    . traMADol (ULTRAM) 50 MG tablet Take 1 tablet (50 mg total) by mouth every 8 (eight) hours as needed for moderate pain or severe pain. 30 tablet 0   No current facility-administered medications for this visit.    Allergies  Allergen Reactions  . Metformin And Related Other (See Comments)    Lactic acidosis   . Celebrex [Celecoxib] Other (See Comments)    Dizziness, "makes me feel bad"  . Penicillins Hives and Rash    Has patient had a PCN reaction causing immediate rash, facial/tongue/throat swelling, SOB or lightheadedness with hypotension: Yes Has patient had a PCN reaction causing severe rash involving mucus membranes or skin necrosis:No Has patient had a PCN reaction that required hospitalization: Yes Has patient had a PCN reaction occurring within the last 10 years: No If all of the above answers are "NO", then may proceed with Cephalosporin use.         Objective:  BP 129/78   Pulse 68   Ht 5'  6" (1.676 m)   Wt 191 lb (86.6 kg)   BMI 30.83 kg/m     No results found for this or any previous visit (from the past 56 hour(s)). No results found.    Assessment and Plan: 62 y.o. female with   .Mary Stark was seen today for knee pain.  Diagnoses and all orders for this visit:  Internal derangement of knee involving posterior horn of lateral meniscus, left -     traMADol (ULTRAM) 50 MG tablet; Take 1 tablet (50 mg total) by mouth every 8 (eight) hours as needed for moderate pain or severe pain.  Renal insufficiency -     BASIC METABOLIC PANEL WITH GFR  Chronic pain of left knee -     traMADol (ULTRAM) 50 MG tablet; Take 1 tablet (50 mg total) by mouth every 8 (eight) hours as needed for moderate pain or severe pain.  Primary osteoarthritis of left knee -     traMADol (ULTRAM) 50 MG tablet; Take 1 tablet (50 mg total) by mouth every 8 (eight) hours as needed for moderate pain or severe pain.  Encounter for monitoring NSAID therapy -     BASIC METABOLIC PANEL WITH GFR  Tear of medial meniscus of left knee, current, unspecified tear type, subsequent encounter   Monitor renal function with long-term NSAID use Continue anti-inflammatories and Tylenol Starting Tramadol 50 mg Q8H prn for breakthrough pain, PDMP reviewed, no red flags She would like a second opinion from a different orthopedic surgeon regarding potential meniscal repair She would like to see Dr. Georgina Snell for her ongoing hyaluronic acid injections She will stop by the office next week to be fitted for a DJ OA Reaction knee brace  Follow Up Instructions:    I discussed the assessment and treatment plan with the patient. The patient was provided an opportunity to ask questions and all were answered. The patient agreed with the plan and demonstrated an understanding of the instructions.   The patient was advised to call back or seek an in-person evaluation if the  symptoms worsen or if the condition fails to improve as anticipated.  I provided 11-20 minutes of non-face-to-face time during this encounter.   Trixie Dredge, [place]

## 2019-07-17 ENCOUNTER — Telehealth: Payer: Self-pay | Admitting: Physician Assistant

## 2019-07-17 DIAGNOSIS — M1712 Unilateral primary osteoarthritis, left knee: Secondary | ICD-10-CM

## 2019-07-17 DIAGNOSIS — G8929 Other chronic pain: Secondary | ICD-10-CM

## 2019-07-17 DIAGNOSIS — M23352 Other meniscus derangements, posterior horn of lateral meniscus, left knee: Secondary | ICD-10-CM

## 2019-07-17 DIAGNOSIS — M25562 Pain in left knee: Secondary | ICD-10-CM

## 2019-07-17 LAB — BASIC METABOLIC PANEL WITH GFR
BUN: 21 mg/dL (ref 7–25)
CO2: 26 mmol/L (ref 20–32)
Calcium: 9.9 mg/dL (ref 8.6–10.4)
Chloride: 98 mmol/L (ref 98–110)
Creat: 0.94 mg/dL (ref 0.50–0.99)
GFR, Est African American: 76 mL/min/{1.73_m2} (ref >=60)
GFR, Est Non African American: 65 mL/min/{1.73_m2} (ref >=60)
Glucose, Bld: 91 mg/dL (ref 65–139)
Potassium: 4.1 mmol/L (ref 3.5–5.3)
Sodium: 136 mmol/L (ref 135–146)

## 2019-07-17 NOTE — Telephone Encounter (Signed)
I saw patient last Thursday for knee pain She wanted me to let you know that she would like Dr. Georgina Snell to take over her injections. She is not sure when she is due for injection (Dr. Erlinda Hong did her last injection) She would also like a new referral to Orthopedics for a second opinion on possible meniscal repair

## 2019-07-18 ENCOUNTER — Encounter: Payer: Self-pay | Admitting: Physician Assistant

## 2019-07-18 NOTE — Telephone Encounter (Signed)
Patient advised.

## 2019-07-18 NOTE — Telephone Encounter (Signed)
Monovisc injection or its equivalent can be done every 6 months so it will be quite some time before we can do that again.  In terms of the second opinion I think that is completely reasonable.  I will place a second opinion referral for [facility] in [place].

## 2019-07-19 MED FILL — HYDROCHLOROTHIAZIDE 12.5 MG: 12.5 | 90 days supply | Qty: 90 | Fill #0

## 2019-07-24 MED FILL — UNIFINE PENTIPS 32GX5/32": 32G X 4 MM | 30 days supply | Qty: 100 | Fill #0

## 2019-07-24 MED FILL — SAXENDA 18 MG/3 ML PEN: 18 | 30 days supply | Qty: 15 | Fill #0

## 2019-07-24 MED FILL — UNIFINE PENTIPS 32GX5/32: 32G X 4 MM | 30 days supply | Qty: 100 | Fill #0

## 2019-08-01 MED FILL — ARIPiprazole 5 MG TABS: 5 | 30 days supply | Qty: 30 | Fill #1

## 2019-08-01 MED FILL — GABAPENTIN 300 MG CAPSULE: 300 | 30 days supply | Qty: 150 | Fill #2

## 2019-08-11 MED FILL — TEMAZEPAM 15 MG CAPSULE: 15 | 30 days supply | Qty: 60 | Fill #2

## 2019-08-17 ENCOUNTER — Ambulatory Visit: Payer: No Typology Code available for payment source | Admitting: Physician Assistant

## 2019-08-25 MED FILL — MELOXICAM 7.5 MG TABLET: 7.5 | 30 days supply | Qty: 30 | Fill #2

## 2019-08-25 MED FILL — ASPIRIN 81MG ADULT LOW STRE: 81 | 90 days supply | Qty: 90 | Fill #2

## 2019-08-28 MED FILL — SAXENDA 18 MG/3 ML PEN: 18 | 30 days supply | Qty: 15 | Fill #0

## 2019-08-29 MED FILL — AMLODIPINE BESYLATE 5 MG TA: 5 | 90 days supply | Qty: 90 | Fill #1

## 2019-09-01 MED FILL — GABAPENTIN 300 MG CAPSULE: 300 | 30 days supply | Qty: 150 | Fill #3

## 2019-09-07 ENCOUNTER — Ambulatory Visit (INDEPENDENT_AMBULATORY_CARE_PROVIDER_SITE_OTHER): Payer: No Typology Code available for payment source | Admitting: Psychiatry

## 2019-09-07 ENCOUNTER — Other Ambulatory Visit: Payer: Self-pay

## 2019-09-07 DIAGNOSIS — F419 Anxiety disorder, unspecified: Secondary | ICD-10-CM | POA: Diagnosis not present

## 2019-09-07 DIAGNOSIS — F339 Major depressive disorder, recurrent, unspecified: Secondary | ICD-10-CM | POA: Diagnosis not present

## 2019-09-07 DIAGNOSIS — F418 Other specified anxiety disorders: Secondary | ICD-10-CM

## 2019-09-07 DIAGNOSIS — F5105 Insomnia due to other mental disorder: Secondary | ICD-10-CM

## 2019-09-07 DIAGNOSIS — F3341 Major depressive disorder, recurrent, in partial remission: Secondary | ICD-10-CM

## 2019-09-07 MED ORDER — TEMAZEPAM 15 MG PO CAPS: 15.0000 mg | ORAL_CAPSULE | Freq: Every evening | ORAL | 3 refills | Status: DC | PRN

## 2019-09-07 MED ORDER — GABAPENTIN 300 MG PO CAPS: ORAL_CAPSULE | ORAL | 5 refills | Status: DC

## 2019-09-07 MED ORDER — ARIPIPRAZOLE 2 MG PO TABS: 5.0000 mg | ORAL_TABLET | Freq: Every day | ORAL | 5 refills | Status: DC

## 2019-09-07 MED ORDER — BUPROPION HCL ER (XL) 300 MG PO TB24: 300.0000 mg | ORAL_TABLET | Freq: Every day | ORAL | 1 refills | Status: DC

## 2019-09-07 MED FILL — buPROPion HCL ER (XL) 300 M: 300 | 90 days supply | Qty: 90 | Fill #0

## 2019-09-07 NOTE — Progress Notes (Signed)
Psychiatric Initial Adult Assessment   Patient Identification: Mary Stark MRN:  [id number] Date of Evaluation:  09/07/2019 Referral Source: Dr. Sherlie Ban Chief Complaint:  Anxiety.  I think come down. Visit Diagnosis: Major clinical depression     ICD-10-CM   1. Depression with anxiety  F41.8 ARIPiprazole (ABILIFY) 2 MG tablet  2. Recurrent major depressive disorder, in partial remission (HCC)  F33.41 buPROPion (WELLBUTRIN XL) 300 MG 24 hr tablet  3. 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  She is a little frustrated with homeschooling her 11 and 13-year-old grandchildren.  But overall she is functioning well.  She still works on the weekends as a Marine scientist.  The patient is improved in other ways.  She says she is concentrating well.  She sleeps and eats well.  She is got good energy.  She is going to have her knee looked at next week and probably ultimately have surgery.  Overall though her health is good.  Financially she is stable.  She takes Neurontin 300 mg 1 in the morning and 4 at night and no use of alcohol at this time.  The Restoril that she just taking 1 at night is working fairly well.  The patient denies any psychotic symptoms.  She is functioning very well.  Denies shortness of breath or chest pain or any signs of a physical illness.  Her health therefore is generally good her finances are stable.  She seems to having an improved relationship with her daughter who she has not seen in a long time up until recently when her daughter came for a visit.  Today she shows no evidence of psychosis or dysfunction.   Associated Signs/Symptoms: Depression Symptoms:  feelings of worthlessness/guilt, (Hypo) Manic Symptoms:   Anxiety Symptoms:   Psychotic Symptoms:   PTSD Symptoms:   Past Psychiatric History: multiple presents now taking Abilify 10 mg Wellbutrin  300  mg  Previous Psychotropic Medications: as above  Substance Abuse History in the last 12 months:  Yes.    Consequences of Substance Abuse: Medical Consequences:    Past Medical History:  Past Medical History:  Diagnosis Date  . Alcohol dependence ([place])   . Depression 07/17/2013  . History of hepatitis C 07/17/2013  . History of rheumatic fever   . Hyperlipidemia 07/17/2013  . Hypertension   . Tobacco use   . Toe fracture, right 10/07/2016  . Vitamin D deficiency 07/17/2013    Past Surgical History:  Procedure Laterality Date  . CESAREAN SECTION  1984  . FINGER SURGERY Right    5th  . GYNECOLOGIC CRYOSURGERY    . TONSILLECTOMY      Family Psychiatric History:   Family History:  Family History  Problem Relation Age of Onset  . Ovarian cancer Mother   . Cardiomyopathy Father   . Valvular heart disease Father   . Liver cancer Brother   . Protein C deficiency Brother   . Protein C deficiency Brother   . Stroke Brother     Social History:   Social History   Socioeconomic History  . Marital status: Married    Spouse name: Not on file  . Number of children: Not on file  . Years of education: Not on file  . Highest education level: Not on file  Occupational History  . Not on file  Social Needs  .  Financial resource strain: Not on file  . Food insecurity    Worry: Not on file    Inability: Not on file  . Transportation needs    Medical: Not on file    Non-medical: Not on file  Tobacco Use  . Smoking status: Former Smoker    Packs/day: 1.00    Years: 25.00    Pack years: 25.00    Types: Cigarettes    Quit date: 2012    Years since quitting: 8.7  . Smokeless tobacco: Current User  . Tobacco comment: <1 cig per month  Substance and Sexual Activity  . Alcohol use: Yes    Alcohol/week: 24.0 - 36.0 standard drinks    Types: 24 - 36 Cans of beer per week  . Drug use: No  . Sexual activity: Yes    Birth control/protection: None  Lifestyle  . Physical activity     Days per week: Not on file    Minutes per session: Not on file  . Stress: Not on file  Relationships  . Social Herbalist on phone: Not on file    Gets together: Not on file    Attends religious service: Not on file    Active member of club or organization: Not on file    Attends meetings of clubs or organizations: Not on file    Relationship status: Not on file  Other Topics Concern  . Not on file  Social History Narrative  . Not on file    Additional Social History:   Allergies:   Allergies  Allergen Reactions  . Metformin And Related Other (See Comments)    Lactic acidosis   . Celebrex [Celecoxib] Other (See Comments)    Dizziness, "makes me feel bad"  . Penicillins Hives and Rash    Has patient had a PCN reaction causing immediate rash, facial/tongue/throat swelling, SOB or lightheadedness with hypotension: Yes Has patient had a PCN reaction causing severe rash involving mucus membranes or skin necrosis:No Has patient had a PCN reaction that required hospitalization: Yes Has patient had a PCN reaction occurring within the last 10 years: No If all of the above answers are "NO", then may proceed with Cephalosporin use.     Metabolic Disorder Labs: Lab Results  Component Value Date   HGBA1C 5.6 02/07/2013   No results found for: PROLACTIN Lab Results  Component Value Date   CHOL 179 05/16/2019   TRIG 137 05/16/2019   HDL 57 05/16/2019   CHOLHDL 3.1 05/16/2019   VLDL 20 08/30/2013   LDLCALC 98 05/16/2019   LDLCALC 84 01/10/2018     Current Medications: Current Outpatient Medications  Medication Sig Dispense Refill  . acetaminophen (TYLENOL) 500 MG tablet Take 500 mg by mouth every 8 (eight) hours as needed.     Marland Kitchen amLODipine (NORVASC) 5 MG tablet Take 1 tablet (5 mg total) by mouth daily. 90 tablet 1  . ARIPiprazole (ABILIFY) 2 MG tablet Take 2.5 tablets (5 mg total) by mouth at bedtime. 30 tablet 5  . Ascorbic Acid (VITAMIN C PO) Take 1 tablet by  mouth daily.    . ASPIRIN ADULT LOW STRENGTH 81 MG EC tablet TAKE 1 TABLET (81 MG TOTAL) BY MOUTH DAILY. 90 tablet 3  . Benzphetamine HCl 50 MG TABS Take 50 mg by mouth daily.    Marland Kitchen buPROPion (WELLBUTRIN XL) 300 MG 24 hr tablet Take 1 tablet (300 mg total) by mouth daily. 90 tablet 1  . CALCIUM PO  Take 1 tablet by mouth daily.    . Coenzyme Q10 (CO Q 10 PO) Take by mouth.    . Cyanocobalamin (VITAMIN B 12 PO) Take 1 tablet by mouth daily.    . Diclofenac Sodium (PENNSAID) 2 % SOLN Apply 2 g topically 2 (two) times daily as needed (to affected area). 112 g 3  . gabapentin (NEURONTIN) 300 MG capsule 1  qam   4   qhs 150 capsule 5  . GLUCOSAMINE-CHONDROITIN PO Take 1 tablet by mouth daily.    . hydrochlorothiazide (HYDRODIURIL) 12.5 MG tablet Take 1 tablet (12.5 mg total) by mouth daily. 90 tablet 1  . meloxicam (MOBIC) 7.5 MG tablet Take 1 tablet (7.5 mg total) by mouth daily as needed for pain. 30 tablet 2  . Nebivolol HCl 20 MG TABS Take 1 tablet (20 mg total) by mouth daily. 90 tablet 1  . Omega-3 Fatty Acids (FISH OIL PO) Take 1 tablet by mouth daily.    Marland Kitchen omeprazole (PRILOSEC) 20 MG capsule Take 1 capsule (20 mg total) by mouth daily. 90 capsule 1  . phentermine (ADIPEX-P) 37.5 MG tablet Take 37.5 mg by mouth 2 (two) times a day.    . potassium chloride SA (K-DUR,KLOR-CON) 20 MEQ tablet Take 1 tablet (20 mEq total) by mouth daily. 90 tablet 1  . Prasterone (INTRAROSA) 6.5 MG INST Place 1 application vaginally 2 (two) times a week. 84 each 11  . PROAIR HFA 108 (90 Base) MCG/ACT inhaler INHALE 1 TO 2 PUFFS BY MOUTH INTO THE LUNGS EVERY 4 HOURS AS NEEDED FOR WHEEZING OR SHORTNESS OF BREATH 8.5 g 1  . SAXENDA 18 MG/3ML SOPN Inject 0.6 mg into the skin daily.    . simvastatin (ZOCOR) 20 MG tablet Take 1 tablet (20 mg total) by mouth daily. Due for labs 90 tablet 1  . temazepam (RESTORIL) 15 MG capsule Take 1 capsule (15 mg total) by mouth at bedtime as needed for sleep. 60 capsule 3  . traMADol  (ULTRAM) 50 MG tablet Take 1 tablet (50 mg total) by mouth every 8 (eight) hours as needed for moderate pain or severe pain. 30 tablet 0  . VITAMIN E PO Take 1 tablet by mouth daily.     No current facility-administered medications for this visit.     Neurologic: Headache: No Seizure: No Paresthesias:No  Musculoskeletal: Strength & Muscle Tone: within normal limits Gait & Station: normal Patient leans: N/A  Psychiatric Specialty Exam: ROS  There were no vitals taken for this visit.There is no height or weight on file to calculate BMI.  General Appearance: Casual  Eye Contact:  Good  Speech:  NA  Volume:  Normal  Mood:  Negative  Affect:  Appropriate  Thought Process:  Goal Directed  Orientation:  Full (Time, Place, and Person)  Thought Content:  Logical  Suicidal Thoughts:  No  Homicidal Thoughts:  No  Memory:  NA Immediate;   Good  Judgement:  Good  Insight:  Good  Psychomotor Activity:  NA  Concentration:    Recall:  Good  Fund of Knowledge:Good  Language: Good  Akathisia:  No  Handed:  Right  AIMS (if indicated):    Assets:  Desire for Improvement  ADL's:  Intact  Cognition: WNL  Sleep:      Treatment Plan Summary: This patient has 3 problems.  Her first problem is that of clinical depression.  She takes Wellbutrin 300 mg and does very well.  Today we will go ahead  and reduce her Abilify from 5 mg down to 2 mg.  Her second problem is that of alcohol dependency.  Patient takes Neurontin 300 mg 1 in the morning and 4 at night and seems to be actually doing well.  Her third problem is insomnia.  The patient at this time will increase her Restoril to taking 30 mg.  The patient actually is functioning very well.  She return to see me in 3 months. Jerral Ralph, MD 9/24/20201:28 PM

## 2019-09-08 ENCOUNTER — Other Ambulatory Visit (HOSPITAL_COMMUNITY): Payer: Self-pay

## 2019-09-08 DIAGNOSIS — F418 Other specified anxiety disorders: Secondary | ICD-10-CM

## 2019-09-08 MED ORDER — ARIPIPRAZOLE 5 MG PO TABS: 5.0000 mg | ORAL_TABLET | Freq: Every day | ORAL | 1 refills | Status: DC

## 2019-09-08 MED FILL — TEMAZEPAM 15 MG CAPSULE: 15 | 60 days supply | Qty: 60 | Fill #0

## 2019-09-08 MED FILL — ARIPiprazole 5 MG TABS: 5 | 90 days supply | Qty: 90 | Fill #0

## 2019-09-11 ENCOUNTER — Telehealth: Payer: Self-pay | Admitting: Osteopathic Medicine

## 2019-09-11 ENCOUNTER — Other Ambulatory Visit: Payer: Self-pay

## 2019-09-11 ENCOUNTER — Other Ambulatory Visit (HOSPITAL_COMMUNITY): Payer: Self-pay

## 2019-09-11 ENCOUNTER — Telehealth (HOSPITAL_COMMUNITY): Payer: Self-pay

## 2019-09-11 DIAGNOSIS — F418 Other specified anxiety disorders: Secondary | ICD-10-CM

## 2019-09-11 DIAGNOSIS — I10 Essential (primary) hypertension: Secondary | ICD-10-CM

## 2019-09-11 MED ORDER — NEBIVOLOL HCL 20 MG PO TABS: 20.0000 mg | ORAL_TABLET | Freq: Every day | ORAL | 0 refills | Status: DC

## 2019-09-11 MED FILL — BYSTOLIC 20 MG TABLET: 20 | 90 days supply | Qty: 90 | Fill #0

## 2019-09-11 NOTE — Telephone Encounter (Signed)
Patient saw you and her Abilify was supposed to be decreased to 2 mg a day, however a prescription for 5 mg was sent into the pharmacy. I have advised patient to just take 1.2 a tab for know until I speak with you. Please review and advise, thank you

## 2019-09-11 NOTE — Telephone Encounter (Signed)
Mary Stark called this morning concerned about getting refill on Nebivolol 20mg  tablets. She was a patient of [facility] and didn't know if she should be seen for another refill. She still has about a week's worth left of tablets. I did schedule her an appt. With Dr.a on 10/03/2019.

## 2019-09-11 NOTE — Telephone Encounter (Signed)
Thank you for the update. I have sent the med refill to [facility]. Pls let pt know request was completed.

## 2019-09-19 MED FILL — INTRAROSA 6.5 MG VAG INSERT: 6.5 | 28 days supply | Qty: 28 | Fill #1

## 2019-09-21 MED ORDER — ARIPIPRAZOLE 5 MG PO TABS: 2.5000 mg | ORAL_TABLET | Freq: Every day | ORAL | 0 refills | Status: DC

## 2019-09-21 NOTE — Addendum Note (Signed)
Addended by: Dennie Maizes E on: 09/21/2019 09:30 AM   Modules accepted: Orders

## 2019-09-21 NOTE — Telephone Encounter (Signed)
Per Dr. Casimiro Needle I called patient and told her to continue the 2.5 mg, a new prescription was sent to the pharmacy.

## 2019-09-28 MED FILL — traMADol HCL 50 MG TABS: 50 | 7 days supply | Qty: 28 | Fill #0

## 2019-09-28 MED FILL — HYDROCODON-APAP 10-325: 10-325 | 7 days supply | Qty: 28 | Fill #0

## 2019-10-03 ENCOUNTER — Encounter: Payer: Self-pay | Admitting: Osteopathic Medicine

## 2019-10-03 ENCOUNTER — Ambulatory Visit (INDEPENDENT_AMBULATORY_CARE_PROVIDER_SITE_OTHER): Payer: No Typology Code available for payment source | Admitting: Osteopathic Medicine

## 2019-10-03 ENCOUNTER — Telehealth: Payer: Self-pay | Admitting: Osteopathic Medicine

## 2019-10-03 VITALS — BP 151/93 | HR 75 | Wt 188.0 lb

## 2019-10-03 DIAGNOSIS — I10 Essential (primary) hypertension: Secondary | ICD-10-CM

## 2019-10-03 DIAGNOSIS — M23352 Other meniscus derangements, posterior horn of lateral meniscus, left knee: Secondary | ICD-10-CM

## 2019-10-03 MED ORDER — HYDROCHLOROTHIAZIDE 12.5 MG PO TABS: 25.0000 mg | ORAL_TABLET | Freq: Every day | ORAL | 1 refills | Status: DC

## 2019-10-03 MED ORDER — MELOXICAM 7.5 MG PO TABS: 7.5000 mg | ORAL_TABLET | Freq: Every day | ORAL | 2 refills | Status: DC | PRN

## 2019-10-03 MED FILL — MELOXICAM 7.5 MG TABLET: 7.5 | 30 days supply | Qty: 30 | Fill #0

## 2019-10-03 NOTE — Progress Notes (Signed)
Virtual Visit via Video (App used: Doximity) Note  I connected with      Mary Stark on 10/03/19 at 12:10 PM by a telemedicine application and verified that I am speaking with the correct person using two identifiers.  Patient is at ome I am in office    I discussed the limitations of evaluation and management by telemedicine and the availability of in person appointments. The patient expressed understanding and agreed to proceed.  History of Present Illness: Mary Stark is a 63 y.o. female who would like to discuss BP issues    Bp has been fine but concerned about increase today. Initial 123/73 last night before taking BP meds. Today 151/93 just prior to the visit. Retook now and 155/93 on recheck. No CP/SOB, no HA/VC,  BP Readings from Last 3 Encounters:  07/13/19 129/78  06/13/19 125/85  05/30/19 121/78    Knee scope last week, pt reports LLE distal to surgical site swollen a bit feet/ankle, pt reports to 3+ see PE below     Observations/Objective: There were no vitals taken for this visit. BP Readings from Last 3 Encounters:  07/13/19 129/78  06/13/19 125/85  05/30/19 121/78   Exam: Normal Speech.  NAD Trace edema as best I can examine over video, to ankle, no obvious asymmetry R to L calves/LE  Lab and Radiology Results No results found for this or any previous visit (from the past 72 hour(s)). No results found.     Assessment and Plan: 63 y.o. female with The primary encounter diagnosis was Essential hypertension with goal blood pressure less than 130/80. 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  Postop leg looks ok to me advised pt to see surgeon if any concerns or seek emergency care if needed. No s/s DVT that I can tell.   BP elevated but cuff not verified, ok to increase HCTZ from 12.5 to 25 mg, can come in for nurse visit to verify home machine, can get  labs that day (pt is concerned given renal labs issues, I reviewed recent lbs, CKD3 levels at worst but last chek was ok at Cr 0.94...  PDMP not reviewed this encounter. Orders Placed This Encounter  Procedures  . BASIC METABOLIC PANEL WITH GFR   Meds ordered this encounter  Medications  . meloxicam (MOBIC) 7.5 MG tablet    Sig: Take 1 tablet (7.5 mg total) by mouth daily as needed for pain.    Dispense:  30 tablet    Refill:  2  . hydrochlorothiazide (HYDRODIURIL) 12.5 MG tablet    Sig: Take 2 tablets (25 mg total) by mouth daily.    Dispense:  90 tablet    Refill:  1   There are no Patient Instructions on file for this visit.     Follow Up Instructions: Return for nurse visit check BP machine later this week or early next week.    I discussed the assessment and treatment plan with the patient. The patient was provided an opportunity to ask questions and all were answered. The patient agreed with the plan and demonstrated an understanding of the instructions.   The patient was advised to call back or seek an in-person evaluation if any new concerns, if symptoms worsen or if the condition fails to improve as anticipated.  25 minutes of non-face-to-face time was provided during this encounter.  Historical information moved to improve visibility of documentation.  Past Medical History:  Diagnosis Date  . Alcohol dependence ([place])   . Depression 07/17/2013  . History of hepatitis C 07/17/2013  . History of rheumatic fever   . Hyperlipidemia 07/17/2013  . Hypertension   . Tobacco use   . Toe fracture, right 10/07/2016  . Vitamin D deficiency 07/17/2013   Past Surgical History:  Procedure Laterality Date  . CESAREAN SECTION  1984  . FINGER SURGERY Right    5th  . GYNECOLOGIC CRYOSURGERY    . TONSILLECTOMY     Social History   Tobacco Use  . Smoking status: Former Smoker    Packs/day: 1.00    Years: 25.00    Pack years: 25.00     Types: Cigarettes    Quit date: 2012    Years since quitting: 8.8  . Smokeless tobacco: Current User  . Tobacco comment: <1 cig per month  Substance Use Topics  . Alcohol use: Yes    Alcohol/week: 24.0 - 36.0 standard drinks    Types: 24 - 36 Cans of beer per week   family history includes Cardiomyopathy in her father; Liver cancer in her brother; Ovarian cancer in her mother; Protein C deficiency in her brother and brother; Stroke in her brother; Valvular heart disease in her father.  Medications: Current Outpatient Medications  Medication Sig Dispense Refill  . acetaminophen (TYLENOL) 500 MG tablet Take 500 mg by mouth every 8 (eight) hours as needed.     Marland Kitchen amLODipine (NORVASC) 5 MG tablet Take 1 tablet (5 mg total) by mouth daily. 90 tablet 1  . ARIPiprazole (ABILIFY) 5 MG tablet Take 0.5 tablets (2.5 mg total) by mouth at bedtime. 45 tablet 0  . Ascorbic Acid (VITAMIN C PO) Take 1 tablet by mouth daily.    . ASPIRIN ADULT LOW STRENGTH 81 MG EC tablet TAKE 1 TABLET (81 MG TOTAL) BY MOUTH DAILY. 90 tablet 3  . Benzphetamine HCl 50 MG TABS Take 50 mg by mouth daily.    Marland Kitchen buPROPion (WELLBUTRIN XL) 300 MG 24 hr tablet Take 1 tablet (300 mg total) by mouth daily. 90 tablet 1  . CALCIUM PO Take 1 tablet by mouth daily.    . Coenzyme Q10 (CO Q 10 PO) Take by mouth.    . Cyanocobalamin (VITAMIN B 12 PO) Take 1 tablet by mouth daily.    . Diclofenac Sodium (PENNSAID) 2 % SOLN Apply 2 g topically 2 (two) times daily as needed (to affected area). 112 g 3  . gabapentin (NEURONTIN) 300 MG capsule 1  qam   4   qhs 150 capsule 5  . GLUCOSAMINE-CHONDROITIN PO Take 1 tablet by mouth daily.    . hydrochlorothiazide (HYDRODIURIL) 12.5 MG tablet Take 1 tablet (12.5 mg total) by mouth daily. 90 tablet 1  . meloxicam (MOBIC) 7.5 MG tablet Take 1 tablet (7.5 mg total) by mouth daily as needed for pain. 30 tablet 2  . Nebivolol HCl 20 MG TABS Take 1 tablet (20 mg total) by mouth daily. 90 tablet 0  .  Omega-3 Fatty Acids (FISH OIL PO) Take 1 tablet by mouth daily.    Marland Kitchen omeprazole (PRILOSEC) 20 MG capsule Take 1 capsule (20 mg total) by mouth daily. 90 capsule 1  . phentermine (ADIPEX-P) 37.5 MG tablet Take 37.5 mg by mouth 2 (two) times a day.    . potassium chloride SA (K-DUR,KLOR-CON) 20 MEQ tablet Take 1 tablet (20 mEq total) by  mouth daily. 90 tablet 1  . Prasterone (INTRAROSA) 6.5 MG INST Place 1 application vaginally 2 (two) times a week. 84 each 11  . PROAIR HFA 108 (90 Base) MCG/ACT inhaler INHALE 1 TO 2 PUFFS BY MOUTH INTO THE LUNGS EVERY 4 HOURS AS NEEDED FOR WHEEZING OR SHORTNESS OF BREATH 8.5 g 1  . SAXENDA 18 MG/3ML SOPN Inject 0.6 mg into the skin daily.    . simvastatin (ZOCOR) 20 MG tablet Take 1 tablet (20 mg total) by mouth daily. Due for labs 90 tablet 1  . temazepam (RESTORIL) 15 MG capsule Take 1 capsule (15 mg total) by mouth at bedtime as needed for sleep. 60 capsule 3  . traMADol (ULTRAM) 50 MG tablet Take 1 tablet (50 mg total) by mouth every 8 (eight) hours as needed for moderate pain or severe pain. 30 tablet 0  . VITAMIN E PO Take 1 tablet by mouth daily.     No current facility-administered medications for this visit.    Allergies  Allergen Reactions  . Metformin And Related Other (See Comments)    Lactic acidosis   . Celebrex [Celecoxib] Other (See Comments)    Dizziness, "makes me feel bad"  . Penicillins Hives and Rash    Has patient had a PCN reaction causing immediate rash, facial/tongue/throat swelling, SOB or lightheadedness with hypotension: Yes Has patient had a PCN reaction causing severe rash involving mucus membranes or skin necrosis:No Has patient had a PCN reaction that required hospitalization: Yes Has patient had a PCN reaction occurring within the last 10 years: No If all of the above answers are "NO", then may proceed with Cephalosporin use.     PDMP not reviewed this encounter. No orders of the defined types were placed in this  encounter.  No orders of the defined types were placed in this encounter.

## 2019-10-03 NOTE — Telephone Encounter (Signed)
-----   Message from [name], DO sent at 10/03/2019  1:14 PM EDT ----- Follow Up Instructions: Return for nurse visit check BP machine later this week or early next week.

## 2019-10-03 NOTE — Telephone Encounter (Signed)
I called pt and left a VM for pt to call back and schedule a nurse Visit to check her Home BP machine in the office this week or early next week. Thanks- JP

## 2019-10-04 MED FILL — SIMVASTATIN 20 MG TABLET: 20 | 90 days supply | Qty: 90 | Fill #1

## 2019-10-05 ENCOUNTER — Ambulatory Visit (INDEPENDENT_AMBULATORY_CARE_PROVIDER_SITE_OTHER): Payer: No Typology Code available for payment source | Admitting: Osteopathic Medicine

## 2019-10-05 ENCOUNTER — Other Ambulatory Visit: Payer: Self-pay

## 2019-10-05 VITALS — BP 129/78 | HR 72 | Wt 190.0 lb

## 2019-10-05 DIAGNOSIS — I10 Essential (primary) hypertension: Secondary | ICD-10-CM

## 2019-10-05 MED ORDER — HYDROCHLOROTHIAZIDE 25 MG PO TABS: 25.0000 mg | ORAL_TABLET | Freq: Every day | ORAL | 1 refills | Status: DC

## 2019-10-05 MED FILL — HYDROCHLOROTHIAZIDE 25 MG T: 25 | 90 days supply | Qty: 90 | Fill #0

## 2019-10-05 NOTE — Progress Notes (Signed)
Established Patient Office Visit  Subjective:  Patient ID: Mary Stark, female    DOB: 04-02-1956  Age: 63 y.o. MRN: [id number]  CC:  Chief Complaint  Patient presents with  . Hypertension    HPI Mary Stark presents for blood pressure check. She did bring her home blood pressure monitor. Denies chest pain, shortness of breath, headaches or dizziness. She does have some swelling in her left foot. She recently had left knee surgery. No swelling or pain in the leg. Advised patient to call the Orthopedic due to the swelling in her left foot.    Past Medical History:  Diagnosis Date  . Alcohol dependence ([place])   . Depression 07/17/2013  . History of hepatitis C 07/17/2013  . History of rheumatic fever   . Hyperlipidemia 07/17/2013  . Hypertension   . Tobacco use   . Toe fracture, right 10/07/2016  . Vitamin D deficiency 07/17/2013    Past Surgical History:  Procedure Laterality Date  . CESAREAN SECTION  1984  . FINGER SURGERY Right    5th  . GYNECOLOGIC CRYOSURGERY    . TONSILLECTOMY      Family History  Problem Relation Age of Onset  . Ovarian cancer Mother   . Cardiomyopathy Father   . Valvular heart disease Father   . Liver cancer Brother   . Protein C deficiency Brother   . Protein C deficiency Brother   . Stroke Brother     Social History   Socioeconomic History  . Marital status: Married    Spouse name: Not on file  . Number of children: Not on file  . Years of education: Not on file  . Highest education level: Not on file  Occupational History  . Not on file  Social Needs  . Financial resource strain: Not on file  . Food insecurity    Worry: Not on file    Inability: Not on file  . Transportation needs    Medical: Not on file    Non-medical: Not on file  Tobacco Use  . Smoking status: Former Smoker    Packs/day: 1.00    Years: 25.00    Pack years: 25.00    Types: Cigarettes    Quit date: 2012    Years since quitting: 8.8  . Smokeless  tobacco: Current User  . Tobacco comment: <1 cig per month  Substance and Sexual Activity  . Alcohol use: Yes    Alcohol/week: 24.0 - 36.0 standard drinks    Types: 24 - 36 Cans of beer per week  . Drug use: No  . Sexual activity: Yes    Birth control/protection: None  Lifestyle  . Physical activity    Days per week: Not on file    Minutes per session: Not on file  . Stress: Not on file  Relationships  . Social Herbalist on phone: Not on file    Gets together: Not on file    Attends religious service: Not on file    Active member of club or organization: Not on file    Attends meetings of clubs or organizations: Not on file    Relationship status: Not on file  . Intimate partner violence    Fear of current or ex partner: Not on file    Emotionally abused: Not on file    Physically abused: Not on file    Forced sexual activity: Not on file  Other Topics Concern  . Not on  file  Social History Narrative  . Not on file    Outpatient Medications Prior to Visit  Medication Sig Dispense Refill  . acetaminophen (TYLENOL) 500 MG tablet Take 500 mg by mouth every 8 (eight) hours as needed.     Marland Kitchen amLODipine (NORVASC) 5 MG tablet Take 1 tablet (5 mg total) by mouth daily. 90 tablet 1  . ARIPiprazole (ABILIFY) 5 MG tablet Take 0.5 tablets (2.5 mg total) by mouth at bedtime. 45 tablet 0  . Ascorbic Acid (VITAMIN C PO) Take 1 tablet by mouth daily.    . ASPIRIN ADULT LOW STRENGTH 81 MG EC tablet TAKE 1 TABLET (81 MG TOTAL) BY MOUTH DAILY. 90 tablet 3  . Benzphetamine HCl 50 MG TABS Take 50 mg by mouth daily.    Marland Kitchen buPROPion (WELLBUTRIN XL) 300 MG 24 hr tablet Take 1 tablet (300 mg total) by mouth daily. 90 tablet 1  . CALCIUM PO Take 1 tablet by mouth daily.    . Coenzyme Q10 (CO Q 10 PO) Take by mouth.    . Cyanocobalamin (VITAMIN B 12 PO) Take 1 tablet by mouth daily.    . Diclofenac Sodium (PENNSAID) 2 % SOLN Apply 2 g topically 2 (two) times daily as needed (to affected  area). 112 g 3  . gabapentin (NEURONTIN) 300 MG capsule 1  qam   4   qhs 150 capsule 5  . GLUCOSAMINE-CHONDROITIN PO Take 1 tablet by mouth daily.    . hydrochlorothiazide (HYDRODIURIL) 12.5 MG tablet Take 2 tablets (25 mg total) by mouth daily. 90 tablet 1  . meloxicam (MOBIC) 7.5 MG tablet Take 1 tablet (7.5 mg total) by mouth daily as needed for pain. 30 tablet 2  . Nebivolol HCl 20 MG TABS Take 1 tablet (20 mg total) by mouth daily. 90 tablet 0  . Omega-3 Fatty Acids (FISH OIL PO) Take 1 tablet by mouth daily.    Marland Kitchen omeprazole (PRILOSEC) 20 MG capsule Take 1 capsule (20 mg total) by mouth daily. 90 capsule 1  . phentermine (ADIPEX-P) 37.5 MG tablet Take 37.5 mg by mouth 2 (two) times a day.    . potassium chloride SA (K-DUR,KLOR-CON) 20 MEQ tablet Take 1 tablet (20 mEq total) by mouth daily. 90 tablet 1  . Prasterone (INTRAROSA) 6.5 MG INST Place 1 application vaginally 2 (two) times a week. 84 each 11  . PROAIR HFA 108 (90 Base) MCG/ACT inhaler INHALE 1 TO 2 PUFFS BY MOUTH INTO THE LUNGS EVERY 4 HOURS AS NEEDED FOR WHEEZING OR SHORTNESS OF BREATH 8.5 g 1  . SAXENDA 18 MG/3ML SOPN Inject 0.6 mg into the skin daily.    . simvastatin (ZOCOR) 20 MG tablet Take 1 tablet (20 mg total) by mouth daily. Due for labs 90 tablet 1  . temazepam (RESTORIL) 15 MG capsule Take 1 capsule (15 mg total) by mouth at bedtime as needed for sleep. 60 capsule 3  . traMADol (ULTRAM) 50 MG tablet Take 1 tablet (50 mg total) by mouth every 8 (eight) hours as needed for moderate pain or severe pain. 30 tablet 0  . VITAMIN E PO Take 1 tablet by mouth daily.     No facility-administered medications prior to visit.     Allergies  Allergen Reactions  . Metformin And Related Other (See Comments)    Lactic acidosis   . Celebrex [Celecoxib] Other (See Comments)    Dizziness, "makes me feel bad"  . Penicillins Hives and Rash    Has  patient had a PCN reaction causing immediate rash, facial/tongue/throat swelling, SOB or  lightheadedness with hypotension: Yes Has patient had a PCN reaction causing severe rash involving mucus membranes or skin necrosis:No Has patient had a PCN reaction that required hospitalization: Yes Has patient had a PCN reaction occurring within the last 10 years: No If all of the above answers are "NO", then may proceed with Cephalosporin use.     ROS Review of Systems    Objective:    Physical Exam  BP 129/78   Pulse 72   Wt 190 lb (86.2 kg)   SpO2 100%   BMI 30.67 kg/m  Wt Readings from Last 3 Encounters:  10/05/19 190 lb (86.2 kg)  10/03/19 188 lb (85.3 kg)  07/13/19 191 lb (86.6 kg)     There are no preventive care reminders to display for this patient.  There are no preventive care reminders to display for this patient.  No results found for: TSH Lab Results  Component Value Date   WBC 8.1 05/16/2019   HGB 12.9 05/16/2019   HCT 38.1 05/16/2019   MCV 90.5 05/16/2019   PLT 260 05/16/2019   Lab Results  Component Value Date   NA 136 07/17/2019   K 4.1 07/17/2019   CO2 26 07/17/2019   GLUCOSE 91 07/17/2019   BUN 21 07/17/2019   CREATININE 0.94 07/17/2019   BILITOT 0.4 05/16/2019   ALKPHOS 47 04/07/2018   AST 15 05/16/2019   ALT 13 05/16/2019   PROT 7.3 05/16/2019   ALBUMIN 3.8 04/07/2018   CALCIUM 9.9 07/17/2019   ANIONGAP 7 04/10/2018   Lab Results  Component Value Date   CHOL 179 05/16/2019   Lab Results  Component Value Date   HDL 57 05/16/2019   Lab Results  Component Value Date   LDLCALC 98 05/16/2019   Lab Results  Component Value Date   TRIG 137 05/16/2019   Lab Results  Component Value Date   CHOLHDL 3.1 05/16/2019   Lab Results  Component Value Date   HGBA1C 5.6 02/07/2013      Assessment & Plan:  HTN - Blood pressure within normal limits with our monitor and patient's home blood pressure monitor. Patient advised to continue with the HCTZ 25 mg once daily. She will need a new prescription sent to the pharmacy. Advised  to follow up in 3 months for HTN with Dr Sheppard Coil.   Problem List Items Addressed This Visit    None    Visit Diagnoses    Essential hypertension with goal blood pressure less than 130/80    -  Primary      No orders of the defined types were placed in this encounter.   Follow-up: Return in about 3 months (around 01/05/2020) for HTN with Dr Sheppard Coil. Durene Romans, Monico Blitz, [place]

## 2019-10-07 LAB — BASIC METABOLIC PANEL WITH GFR
BUN/Creatinine Ratio: 15 (calc) (ref 6–22)
BUN: 16 mg/dL (ref 7–25)
CO2: 27 mmol/L (ref 20–32)
Calcium: 9.3 mg/dL (ref 8.6–10.4)
Chloride: 101 mmol/L (ref 98–110)
Creat: 1.05 mg/dL — ABNORMAL HIGH (ref 0.50–0.99)
GFR, Est African American: 66 mL/min/{1.73_m2} (ref >=60)
GFR, Est Non African American: 57 mL/min/{1.73_m2} — ABNORMAL LOW (ref >=60)
Glucose, Bld: 103 mg/dL — ABNORMAL HIGH (ref 65–99)
Potassium: 4.4 mmol/L (ref 3.5–5.3)
Sodium: 138 mmol/L (ref 135–146)

## 2019-10-10 MED FILL — GABAPENTIN 300 MG CAPSULE: 300 | 30 days supply | Qty: 150 | Fill #0

## 2019-10-16 MED FILL — OMEPRAZOLE DR 40 MG CAPSULE: 40 | 30 days supply | Qty: 30 | Fill #0

## 2019-10-16 MED FILL — SAXENDA 18 MG/3 ML PEN: 18 | 30 days supply | Qty: 15 | Fill #0

## 2019-10-25 ENCOUNTER — Telehealth: Payer: Self-pay | Admitting: Osteopathic Medicine

## 2019-10-25 DIAGNOSIS — Z791 Long term (current) use of non-steroidal anti-inflammatories (NSAID): Secondary | ICD-10-CM

## 2019-10-25 NOTE — Telephone Encounter (Signed)
Thank you :)

## 2019-10-25 NOTE — Telephone Encounter (Signed)
Patient is completley out of omeprazole (PRILOSEC) 40 MG capsule [id number]. Patient states that she would like this filled. Please advise.

## 2019-10-25 NOTE — Telephone Encounter (Signed)
OK to refill (In the future FYI, refill requests can go to triage/assistant, they do not need to go to provider)

## 2019-10-26 MED ORDER — OMEPRAZOLE 20 MG PO CPDR: 20.0000 mg | DELAYED_RELEASE_CAPSULE | Freq: Every day | ORAL | 1 refills | Status: DC

## 2019-10-26 MED FILL — OMEPRAZOLE 20 MG CAPSULE DR: 20 | 90 days supply | Qty: 90 | Fill #0

## 2019-10-26 NOTE — Telephone Encounter (Signed)
Refill sent.

## 2019-10-26 NOTE — Addendum Note (Signed)
Addended by: Narda Rutherford on: 10/26/2019 07:50 AM   Modules accepted: Orders

## 2019-11-13 MED FILL — SAXENDA 18 MG/3 ML PEN: 18 | 30 days supply | Qty: 15 | Fill #0

## 2019-11-21 ENCOUNTER — Other Ambulatory Visit: Payer: Self-pay | Admitting: Physician Assistant

## 2019-11-21 DIAGNOSIS — I10 Essential (primary) hypertension: Secondary | ICD-10-CM

## 2019-11-21 MED FILL — AMLODIPINE BESYLATE 5 MG TA: 5 | 90 days supply | Qty: 90 | Fill #0

## 2019-11-22 ENCOUNTER — Ambulatory Visit (INDEPENDENT_AMBULATORY_CARE_PROVIDER_SITE_OTHER): Payer: No Typology Code available for payment source | Admitting: Psychiatry

## 2019-11-22 ENCOUNTER — Other Ambulatory Visit: Payer: Self-pay

## 2019-11-22 DIAGNOSIS — F5105 Insomnia due to other mental disorder: Secondary | ICD-10-CM

## 2019-11-22 DIAGNOSIS — F418 Other specified anxiety disorders: Secondary | ICD-10-CM | POA: Diagnosis not present

## 2019-11-22 DIAGNOSIS — F3341 Major depressive disorder, recurrent, in partial remission: Secondary | ICD-10-CM | POA: Diagnosis not present

## 2019-11-22 DIAGNOSIS — F419 Anxiety disorder, unspecified: Secondary | ICD-10-CM

## 2019-11-22 DIAGNOSIS — F325 Major depressive disorder, single episode, in full remission: Secondary | ICD-10-CM

## 2019-11-22 MED ORDER — GABAPENTIN 300 MG PO CAPS: ORAL_CAPSULE | ORAL | 5 refills | Status: DC

## 2019-11-22 MED ORDER — TEMAZEPAM 15 MG PO CAPS: 15.0000 mg | ORAL_CAPSULE | Freq: Every evening | ORAL | 3 refills | Status: DC | PRN

## 2019-11-22 MED ORDER — BUPROPION HCL ER (XL) 300 MG PO TB24: 300.0000 mg | ORAL_TABLET | Freq: Every day | ORAL | 1 refills | Status: DC

## 2019-11-22 MED ORDER — ARIPIPRAZOLE (SENSOR) 2 MG PO TABS: 2.5000 mg | ORAL_TABLET | Freq: Every day | ORAL | 3 refills | Status: DC

## 2019-11-22 MED FILL — TEMAZEPAM 15 MG CAPSULE: 15 | 60 days supply | Qty: 60 | Fill #0

## 2019-11-22 NOTE — Progress Notes (Signed)
Psychiatric Initial Adult Assessment   Patient Identification: Mary Stark MRN:  [id number] Date of Evaluation:  11/22/2019 Referral Source: Dr. Sherlie Ban Chief Complaint:  Anxiety.  I think come down. Visit Diagnosis: Major clinical depression     ICD-10-CM   1. Depression with anxiety  F41.8 ARIPiprazole 2 MG TABS  2. Recurrent major depressive disorder, in partial remission (HCC)  F33.41 buPROPion (WELLBUTRIN XL) 300 MG 24 hr tablet  3. Insomnia secondary to anxiety  F41.9 temazepam (RESTORIL) 15 MG capsule   F51.05     History of Present Illness:   Today the patient is doing very well.  She continues to homeschool her 2 grandchildren and they are doing well.  Patient continues to work in the hospital.  She is having no problems.  She recently had some knee surgery which is minor and she is completely recovered.  She denies chest pain or shortness of breath.  She denies any signs of a viral infection.  Her mood is good.  She is sleeping and eating well and has good energy.  She enjoys life.  Unfortunately she drinks a bit too much alcohol.  She drinks 3 or 4 beers a night.  In the next week or 2 she is going to cut it back down to 1 or 2 because she has gained weight.  Once again we gave her the warning about the problems of drinking more than 2 beers a night.  The patient has no evidence of psychosis.  She takes her medicines as prescribed.  She is functioning very well.    Associated Signs/Symptoms: Depression Symptoms:  feelings of worthlessness/guilt, (Hypo) Manic Symptoms:   Anxiety Symptoms:   Psychotic Symptoms:   PTSD Symptoms:   Past Psychiatric History: multiple presents now taking Abilify 10 mg Wellbutrin  300 mg  Previous Psychotropic Medications: as above  Substance Abuse History in the last 12 months:  Yes.    Consequences of Substance Abuse: Medical Consequences:    Past Medical History:  Past Medical History:  Diagnosis Date  . Alcohol dependence  ([place])   . Depression 07/17/2013  . History of hepatitis C 07/17/2013  . History of rheumatic fever   . Hyperlipidemia 07/17/2013  . Hypertension   . Tobacco use   . Toe fracture, right 10/07/2016  . Vitamin D deficiency 07/17/2013    Past Surgical History:  Procedure Laterality Date  . CESAREAN SECTION  1984  . FINGER SURGERY Right    5th  . GYNECOLOGIC CRYOSURGERY    . TONSILLECTOMY      Family Psychiatric History:   Family History:  Family History  Problem Relation Age of Onset  . Ovarian cancer Mother   . Cardiomyopathy Father   . Valvular heart disease Father   . Liver cancer Brother   . Protein C deficiency Brother   . Protein C deficiency Brother   . Stroke Brother     Social History:   Social History   Socioeconomic History  . Marital status: Married    Spouse name: Not on file  . Number of children: Not on file  . Years of education: Not on file  . Highest education level: Not on file  Occupational History  . Not on file  Social Needs  . Financial resource strain: Not on file  . Food insecurity    Worry: Not on file    Inability: Not on file  . Transportation needs    Medical: Not on file  Non-medical: Not on file  Tobacco Use  . Smoking status: Former Smoker    Packs/day: 1.00    Years: 25.00    Pack years: 25.00    Types: Cigarettes    Quit date: 2012    Years since quitting: 8.9  . Smokeless tobacco: Current User  . Tobacco comment: <1 cig per month  Substance and Sexual Activity  . Alcohol use: Yes    Alcohol/week: 24.0 - 36.0 standard drinks    Types: 24 - 36 Cans of beer per week  . Drug use: No  . Sexual activity: Yes    Birth control/protection: None  Lifestyle  . Physical activity    Days per week: Not on file    Minutes per session: Not on file  . Stress: Not on file  Relationships  . Social Herbalist on phone: Not on file    Gets together: Not on file    Attends religious service: Not on file    Active member of  club or organization: Not on file    Attends meetings of clubs or organizations: Not on file    Relationship status: Not on file  Other Topics Concern  . Not on file  Social History Narrative  . Not on file    Additional Social History:   Allergies:   Allergies  Allergen Reactions  . Metformin And Related Other (See Comments)    Lactic acidosis   . Celebrex [Celecoxib] Other (See Comments)    Dizziness, "makes me feel bad"  . Penicillins Hives and Rash    Has patient had a PCN reaction causing immediate rash, facial/tongue/throat swelling, SOB or lightheadedness with hypotension: Yes Has patient had a PCN reaction causing severe rash involving mucus membranes or skin necrosis:No Has patient had a PCN reaction that required hospitalization: Yes Has patient had a PCN reaction occurring within the last 10 years: No If all of the above answers are "NO", then may proceed with Cephalosporin use.     Metabolic Disorder Labs: Lab Results  Component Value Date   HGBA1C 5.6 02/07/2013   No results found for: PROLACTIN Lab Results  Component Value Date   CHOL 179 05/16/2019   TRIG 137 05/16/2019   HDL 57 05/16/2019   CHOLHDL 3.1 05/16/2019   VLDL 20 08/30/2013   LDLCALC 98 05/16/2019   LDLCALC 84 01/10/2018     Current Medications: Current Outpatient Medications  Medication Sig Dispense Refill  . acetaminophen (TYLENOL) 500 MG tablet Take 500 mg by mouth every 8 (eight) hours as needed.     Marland Kitchen amLODipine (NORVASC) 5 MG tablet TAKE 1 TABLET (5 MG TOTAL) BY MOUTH DAILY. 90 tablet 1  . ARIPiprazole 2 MG TABS Take 2.5 mg by mouth at bedtime. 30 tablet 3  . Ascorbic Acid (VITAMIN C PO) Take 1 tablet by mouth daily.    . ASPIRIN ADULT LOW STRENGTH 81 MG EC tablet TAKE 1 TABLET (81 MG TOTAL) BY MOUTH DAILY. 90 tablet 3  . Benzphetamine HCl 50 MG TABS Take 50 mg by mouth daily.    Marland Kitchen buPROPion (WELLBUTRIN XL) 300 MG 24 hr tablet Take 1 tablet (300 mg total) by mouth daily. 90 tablet  1  . CALCIUM PO Take 1 tablet by mouth daily.    . Coenzyme Q10 (CO Q 10 PO) Take by mouth.    . Cyanocobalamin (VITAMIN B 12 PO) Take 1 tablet by mouth daily.    . Diclofenac Sodium (PENNSAID) 2 %  SOLN Apply 2 g topically 2 (two) times daily as needed (to affected area). 112 g 3  . gabapentin (NEURONTIN) 300 MG capsule 1  qam   4   qhs 150 capsule 5  . GLUCOSAMINE-CHONDROITIN PO Take 1 tablet by mouth daily.    . hydrochlorothiazide (HYDRODIURIL) 25 MG tablet Take 1 tablet (25 mg total) by mouth daily. 90 tablet 1  . meloxicam (MOBIC) 7.5 MG tablet Take 1 tablet (7.5 mg total) by mouth daily as needed for pain. 30 tablet 2  . Nebivolol HCl 20 MG TABS Take 1 tablet (20 mg total) by mouth daily. 90 tablet 0  . Omega-3 Fatty Acids (FISH OIL PO) Take 1 tablet by mouth daily.    Marland Kitchen omeprazole (PRILOSEC) 20 MG capsule Take 1 capsule (20 mg total) by mouth daily. 90 capsule 1  . phentermine (ADIPEX-P) 37.5 MG tablet Take 37.5 mg by mouth 2 (two) times a day.    . potassium chloride SA (K-DUR,KLOR-CON) 20 MEQ tablet Take 1 tablet (20 mEq total) by mouth daily. 90 tablet 1  . Prasterone (INTRAROSA) 6.5 MG INST Place 1 application vaginally 2 (two) times a week. 84 each 11  . PROAIR HFA 108 (90 Base) MCG/ACT inhaler INHALE 1 TO 2 PUFFS BY MOUTH INTO THE LUNGS EVERY 4 HOURS AS NEEDED FOR WHEEZING OR SHORTNESS OF BREATH 8.5 g 1  . SAXENDA 18 MG/3ML SOPN Inject 0.6 mg into the skin daily.    . simvastatin (ZOCOR) 20 MG tablet Take 1 tablet (20 mg total) by mouth daily. Due for labs 90 tablet 1  . temazepam (RESTORIL) 15 MG capsule Take 1 capsule (15 mg total) by mouth at bedtime as needed for sleep. 60 capsule 3  . traMADol (ULTRAM) 50 MG tablet Take 1 tablet (50 mg total) by mouth every 8 (eight) hours as needed for moderate pain or severe pain. 30 tablet 0  . VITAMIN E PO Take 1 tablet by mouth daily.     No current facility-administered medications for this visit.     Neurologic: Headache:  No Seizure: No Paresthesias:No  Musculoskeletal: Strength & Muscle Tone: within normal limits Gait & Station: normal Patient leans: N/A  Psychiatric Specialty Exam: ROS  There were no vitals taken for this visit.There is no height or weight on file to calculate BMI.  General Appearance: Casual  Eye Contact:  Good  Speech:  NA  Volume:  Normal  Mood:  Negative  Affect:  Appropriate  Thought Process:  Goal Directed  Orientation:  Full (Time, Place, and Person)  Thought Content:  Logical  Suicidal Thoughts:  No  Homicidal Thoughts:  No  Memory:  NA Immediate;   Good  Judgement:  Good  Insight:  Good  Psychomotor Activity:  NA  Concentration:    Recall:  Good  Fund of Knowledge:Good  Language: Good  Akathisia:  No  Handed:  Right  AIMS (if indicated):    Assets:  Desire for Improvement  ADL's:  Intact  Cognition: WNL  Sleep:      Treatment Plan Summary: At this time the patient is doing well.  Her first problem is that of major depression and she takes Wellbutrin 300 mg.  She clearly gets a benefit from it.  She is enjoying life and her mood is stable.  Her second problem is alcohol dependency.  Patient will attempt to reduce her alcohol on her own.  She will continue taking 300 mg of Neurontin 1 in the morning and  4 at night.  Her third problem is that of insomnia.  She will take Restoril on a as needed basis.  Seems to work well.  The patient is functioning well she is not suicidal she is positive and optimistic.  She will be seen again in 3 to 4 months.

## 2019-11-24 MED FILL — MELOXICAM 7.5 MG TABLET: 7.5 | 30 days supply | Qty: 30 | Fill #1

## 2019-12-05 ENCOUNTER — Telehealth (HOSPITAL_COMMUNITY): Payer: Self-pay | Admitting: *Deleted

## 2019-12-05 NOTE — Telephone Encounter (Signed)
Pharmacy called to clarify medication. Abilify 2mg  written as Take 2.5mg  at bedtime. They would like to know if she should be on 5mg  and take 1/2 tablet or if the dosage should be 2mg  at bedtime?

## 2019-12-11 MED FILL — SAXENDA 18 MG/3 ML PEN: 18 | 30 days supply | Qty: 15 | Fill #0

## 2019-12-12 ENCOUNTER — Encounter: Payer: Self-pay | Admitting: Osteopathic Medicine

## 2019-12-12 ENCOUNTER — Ambulatory Visit (INDEPENDENT_AMBULATORY_CARE_PROVIDER_SITE_OTHER): Payer: No Typology Code available for payment source | Admitting: Osteopathic Medicine

## 2019-12-12 VITALS — BP 119/79 | HR 75 | Wt 190.0 lb

## 2019-12-12 DIAGNOSIS — R159 Full incontinence of feces: Secondary | ICD-10-CM

## 2019-12-12 DIAGNOSIS — I10 Essential (primary) hypertension: Secondary | ICD-10-CM

## 2019-12-12 NOTE — Progress Notes (Signed)
Virtual Visit via Video (App used: Doximity) Note  I connected with      [name] on 12/12/19 at 1:51 PM  by a telemedicine application and verified that I am speaking with the correct person using two identifiers.  Patient is at home I am in office   I discussed the limitations of evaluation and management by telemedicine and the availability of in person appointments. The patient expressed understanding and agreed to proceed.  History of Present Illness: Mary Stark is a 63 y.o. female who would like to discuss bowel incontinence  Soft BM spontaneously on occasion while going about her day. No cramping, no pain. Report loss of sensation in rectal area. Last colonoscopy showed no concerns, not due for repeat until 12/2019.   Ongoing for about a year altogether, episodes for few days at a time then resolve. Only reason she brings it up now is it's becoming a problem at work. Last episode Weds-Sun this week (today is Tues). Reports passing soft stool, full BM on occasion.         Observations/Objective: BP 119/79   Pulse 75   Wt 190 lb (86.2 kg)   BMI 30.67 kg/m  BP Readings from Last 3 Encounters:  12/12/19 119/79  10/05/19 129/78  10/03/19 (!) 151/93   Exam: Normal Speech.  NAD  Lab and Radiology Results No results found for this or any previous visit (from the past 72 hour(s)). No results found.     Assessment and Plan: 63 y.o. female with The encounter diagnosis was Full incontinence of feces.   PDMP not reviewed this encounter. Orders Placed This Encounter  Procedures  . MR Pelvis W Wo Contrast    Order Specific Question:   If indicated for the ordered procedure, I authorize the administration of contrast media per Radiology protocol    Answer:   Yes    Order Specific Question:   What is the patient's sedation requirement?    Answer:   No Sedation    Order Specific Question:   Does the patient have a pacemaker or implanted devices?   Answer:   No    Order Specific Question:   Radiology Contrast Protocol - do NOT remove file path    Answer:   [email or web address]    Order Specific Question:   Preferred imaging location?    Answer:   Product/process development scientist (table limit-350lbs)  . Ambulatory referral to Gastroenterology    Referral Priority:   Routine    Referral Type:   Consultation    Referral Reason:   Specialty Services Required    Number of Visits Requested:   1   No orders of the defined types were placed in this encounter.  Patient Instructions  Plan:  Referral to Dr Paulita Fujita at [facility] Will get MRI in the meantime If severe diarrhea, pain with defecation, weakness/falls, please seek emergency attention!      Instructions sent via MyChart. If MyChart not available, pt was given option for info via personal e-mail w/ no guarantee of protected health info over unsecured e-mail communication, and MyChart sign-up instructions were sent to patient.   Follow Up Instructions: Return if symptoms worsen or fail to improve.    I discussed the assessment and treatment plan with the patient. The patient was provided an opportunity to ask questions and all were answered. The patient agreed with the plan and demonstrated an understanding of the instructions.   The patient was advised to call  back or seek an in-person evaluation if any new concerns, if symptoms worsen or if the condition fails to improve as anticipated.  25 minutes of non-face-to-face time was provided during this encounter.      . . . . . . . . . . . . . Marland Kitchen                   Historical information moved to improve visibility of documentation.  Past Medical History:  Diagnosis Date  . Alcohol dependence ([place])   . Depression 07/17/2013  . History of hepatitis C 07/17/2013  . History of rheumatic fever   . Hyperlipidemia 07/17/2013  . Hypertension   . Tobacco use   . Toe fracture, right 10/07/2016   . Vitamin D deficiency 07/17/2013   Past Surgical History:  Procedure Laterality Date  . CESAREAN SECTION  1984  . FINGER SURGERY Right    5th  . GYNECOLOGIC CRYOSURGERY    . TONSILLECTOMY     Social History   Tobacco Use  . Smoking status: Former Smoker    Packs/day: 1.00    Years: 25.00    Pack years: 25.00    Types: Cigarettes    Quit date: 2012    Years since quitting: 9.0  . Smokeless tobacco: Current User  . Tobacco comment: <1 cig per month  Substance Use Topics  . Alcohol use: Yes    Alcohol/week: 24.0 - 36.0 standard drinks    Types: 24 - 36 Cans of beer per week   family history includes Cardiomyopathy in her father; Liver cancer in her brother; Ovarian cancer in her mother; Protein C deficiency in her brother and brother; Stroke in her brother; Valvular heart disease in her father.  Medications: Current Outpatient Medications  Medication Sig Dispense Refill  . acetaminophen (TYLENOL) 500 MG tablet Take 500 mg by mouth every 8 (eight) hours as needed.     Marland Kitchen amLODipine (NORVASC) 5 MG tablet TAKE 1 TABLET (5 MG TOTAL) BY MOUTH DAILY. 90 tablet 1  . ARIPiprazole 2 MG TABS Take 2.5 mg by mouth at bedtime. 30 tablet 3  . Ascorbic Acid (VITAMIN C PO) Take 1 tablet by mouth daily.    . ASPIRIN ADULT LOW STRENGTH 81 MG EC tablet TAKE 1 TABLET (81 MG TOTAL) BY MOUTH DAILY. 90 tablet 3  . Benzphetamine HCl 50 MG TABS Take 50 mg by mouth daily.    Marland Kitchen buPROPion (WELLBUTRIN XL) 300 MG 24 hr tablet Take 1 tablet (300 mg total) by mouth daily. 90 tablet 1  . CALCIUM PO Take 1 tablet by mouth daily.    . Coenzyme Q10 (CO Q 10 PO) Take by mouth.    . Cyanocobalamin (VITAMIN B 12 PO) Take 1 tablet by mouth daily.    . Diclofenac Sodium (PENNSAID) 2 % SOLN Apply 2 g topically 2 (two) times daily as needed (to affected area). 112 g 3  . gabapentin (NEURONTIN) 300 MG capsule 1  qam   4   qhs 150 capsule 5  . GLUCOSAMINE-CHONDROITIN PO Take 1 tablet by mouth daily.    .  hydrochlorothiazide (HYDRODIURIL) 25 MG tablet Take 1 tablet (25 mg total) by mouth daily. 90 tablet 1  . meloxicam (MOBIC) 7.5 MG tablet Take 1 tablet (7.5 mg total) by mouth daily as needed for pain. 30 tablet 2  . Nebivolol HCl 20 MG TABS Take 1 tablet (20 mg total) by mouth daily. 90 tablet 0  . Omega-3 Fatty  Acids (FISH OIL PO) Take 1 tablet by mouth daily.    Marland Kitchen omeprazole (PRILOSEC) 20 MG capsule Take 1 capsule (20 mg total) by mouth daily. 90 capsule 1  . phentermine (ADIPEX-P) 37.5 MG tablet Take 37.5 mg by mouth 2 (two) times a day.    . potassium chloride SA (K-DUR,KLOR-CON) 20 MEQ tablet Take 1 tablet (20 mEq total) by mouth daily. 90 tablet 1  . Prasterone (INTRAROSA) 6.5 MG INST Place 1 application vaginally 2 (two) times a week. 84 each 11  . PROAIR HFA 108 (90 Base) MCG/ACT inhaler INHALE 1 TO 2 PUFFS BY MOUTH INTO THE LUNGS EVERY 4 HOURS AS NEEDED FOR WHEEZING OR SHORTNESS OF BREATH 8.5 g 1  . SAXENDA 18 MG/3ML SOPN Inject 0.6 mg into the skin daily.    . simvastatin (ZOCOR) 20 MG tablet Take 1 tablet (20 mg total) by mouth daily. Due for labs 90 tablet 1  . temazepam (RESTORIL) 15 MG capsule Take 1 capsule (15 mg total) by mouth at bedtime as needed for sleep. 60 capsule 3  . traMADol (ULTRAM) 50 MG tablet Take 1 tablet (50 mg total) by mouth every 8 (eight) hours as needed for moderate pain or severe pain. 30 tablet 0  . VITAMIN E PO Take 1 tablet by mouth daily.     No current facility-administered medications for this visit.   Allergies  Allergen Reactions  . Metformin And Related Other (See Comments)    Lactic acidosis   . Celebrex [Celecoxib] Other (See Comments)    Dizziness, "makes me feel bad"  . Penicillins Hives and Rash    Has patient had a PCN reaction causing immediate rash, facial/tongue/throat swelling, SOB or lightheadedness with hypotension: Yes Has patient had a PCN reaction causing severe rash involving mucus membranes or skin necrosis:No Has patient had  a PCN reaction that required hospitalization: Yes Has patient had a PCN reaction occurring within the last 10 years: No If all of the above answers are "NO", then may proceed with Cephalosporin use.

## 2019-12-12 NOTE — Patient Instructions (Addendum)
Plan:  Referral to Dr Paulita Fujita at [place] [facility] Will get MRI in the meantime If severe diarrhea, pain with defecation, weakness/falls, please seek emergency attention!

## 2019-12-14 MED FILL — ASPIRIN LOW DOSE 81 MG TBEC: 81 | 90 days supply | Qty: 90 | Fill #3

## 2019-12-14 MED FILL — GABAPENTIN 300 MG CAPSULE: 300 | 30 days supply | Qty: 150 | Fill #2

## 2019-12-19 ENCOUNTER — Other Ambulatory Visit: Payer: Self-pay

## 2019-12-19 ENCOUNTER — Telehealth (HOSPITAL_COMMUNITY): Payer: Self-pay

## 2019-12-19 ENCOUNTER — Other Ambulatory Visit (HOSPITAL_COMMUNITY): Payer: Self-pay | Admitting: *Deleted

## 2019-12-19 ENCOUNTER — Ambulatory Visit: Payer: No Typology Code available for payment source

## 2019-12-19 DIAGNOSIS — F418 Other specified anxiety disorders: Secondary | ICD-10-CM

## 2019-12-19 LAB — COMPLETE METABOLIC PANEL WITH GFR
AG Ratio: 1.6 (calc) (ref 1.0–2.5)
ALT: 15 U/L (ref 6–29)
AST: 16 U/L (ref 10–35)
Albumin: 4.5 g/dL (ref 3.6–5.1)
Alkaline phosphatase (APISO): 65 U/L (ref 37–153)
BUN: 19 mg/dL (ref 7–25)
CO2: 31 mmol/L (ref 20–32)
Calcium: 9.6 mg/dL (ref 8.6–10.4)
Chloride: 92 mmol/L — ABNORMAL LOW (ref 98–110)
Creat: 0.94 mg/dL (ref 0.50–0.99)
GFR, Est African American: 75 mL/min/{1.73_m2} (ref >=60)
GFR, Est Non African American: 65 mL/min/{1.73_m2} (ref >=60)
Globulin: 2.8 g/dL (calc) (ref 1.9–3.7)
Glucose, Bld: 80 mg/dL (ref 65–99)
Potassium: 3.5 mmol/L (ref 3.5–5.3)
Sodium: 133 mmol/L — ABNORMAL LOW (ref 135–146)
Total Bilirubin: 0.4 mg/dL (ref 0.2–1.2)
Total Protein: 7.3 g/dL (ref 6.1–8.1)

## 2019-12-19 MED ORDER — ARIPIPRAZOLE (SENSOR) 2 MG PO TABS: 2.0000 mg | ORAL_TABLET | Freq: Every day | ORAL | 3 refills | Status: DC

## 2019-12-19 NOTE — Telephone Encounter (Signed)
Patient called regarding her Aripiprazole. She stated that the pharmacy won't fill it because the medication is put in as Aripiprazole 2mg  but the sig is Take 2.5mg  po qhs. Would you like her to take 2mg  or 2.5mg ? Please advise. Thank you.

## 2019-12-20 ENCOUNTER — Other Ambulatory Visit (HOSPITAL_COMMUNITY): Payer: Self-pay | Admitting: Osteopathic Medicine

## 2019-12-20 DIAGNOSIS — R159 Full incontinence of feces: Secondary | ICD-10-CM

## 2019-12-22 ENCOUNTER — Other Ambulatory Visit: Payer: Self-pay | Admitting: Family Medicine

## 2019-12-22 DIAGNOSIS — I10 Essential (primary) hypertension: Secondary | ICD-10-CM

## 2019-12-22 MED FILL — BYSTOLIC 20 MG TABLET: 20 | 90 days supply | Qty: 90 | Fill #0

## 2019-12-27 MED FILL — PEG-3350 SOLUTION: 420 | 1 days supply | Qty: 4000 | Fill #0

## 2019-12-29 MED FILL — MELOXICAM 7.5 MG TABLET: 7.5 | 30 days supply | Qty: 30 | Fill #2

## 2020-01-03 ENCOUNTER — Encounter (HOSPITAL_COMMUNITY): Payer: Self-pay

## 2020-01-03 ENCOUNTER — Ambulatory Visit (HOSPITAL_COMMUNITY)
Admission: RE | Admit: 2020-01-03 | Discharge: 2020-01-03 | Disposition: A | Discharge status: Home / Self Care | Payer: No Typology Code available for payment source | Source: Ambulatory Visit | Attending: Osteopathic Medicine | Admitting: Osteopathic Medicine

## 2020-01-03 ENCOUNTER — Other Ambulatory Visit: Payer: Self-pay

## 2020-01-03 ENCOUNTER — Other Ambulatory Visit (HOSPITAL_COMMUNITY): Payer: No Typology Code available for payment source

## 2020-01-03 ENCOUNTER — Encounter: Payer: Self-pay | Admitting: Osteopathic Medicine

## 2020-01-03 ENCOUNTER — Other Ambulatory Visit (HOSPITAL_COMMUNITY): Payer: Self-pay | Admitting: Osteopathic Medicine

## 2020-01-03 DIAGNOSIS — R159 Full incontinence of feces: Secondary | ICD-10-CM

## 2020-01-04 ENCOUNTER — Other Ambulatory Visit (HOSPITAL_COMMUNITY): Payer: Self-pay | Admitting: *Deleted

## 2020-01-04 DIAGNOSIS — F418 Other specified anxiety disorders: Secondary | ICD-10-CM

## 2020-01-04 MED ORDER — ARIPIPRAZOLE 2 MG PO TABS: 2.0000 mg | ORAL_TABLET | Freq: Every day | ORAL | 3 refills | Status: DC

## 2020-01-04 MED FILL — SAXENDA 18 MG/3 ML PEN: 18 | 30 days supply | Qty: 15 | Fill #0

## 2020-01-04 MED FILL — ARIPiprazole 2 MG TABS: 2 | 30 days supply | Qty: 30 | Fill #0

## 2020-01-08 NOTE — Telephone Encounter (Signed)
Notified pharmacy and notified patient that medication is ready for pickup

## 2020-01-16 LAB — HM COLONOSCOPY

## 2020-01-18 ENCOUNTER — Other Ambulatory Visit: Payer: Self-pay | Admitting: Gastroenterology

## 2020-01-24 ENCOUNTER — Other Ambulatory Visit: Payer: Self-pay | Admitting: Physician Assistant

## 2020-01-24 MED FILL — SIMVASTATIN 20 MG TABLET: 20 | 90 days supply | Qty: 90 | Fill #0

## 2020-01-24 MED FILL — TEMAZEPAM 15 MG CAPSULE: 15 | 60 days supply | Qty: 60 | Fill #1

## 2020-01-24 MED FILL — GABAPENTIN 300 MG CAPSULE: 300 | 30 days supply | Qty: 150 | Fill #0

## 2020-01-29 ENCOUNTER — Other Ambulatory Visit (HOSPITAL_COMMUNITY)
Admission: RE | Admit: 2020-01-29 | Discharge: 2020-01-29 | Disposition: A | Discharge status: Home / Self Care | Payer: No Typology Code available for payment source | Source: Ambulatory Visit | Attending: Gastroenterology | Admitting: Gastroenterology

## 2020-01-29 DIAGNOSIS — Z01812 Encounter for preprocedural laboratory examination: Secondary | ICD-10-CM | POA: Insufficient documentation

## 2020-01-29 DIAGNOSIS — Z20822 Contact with and (suspected) exposure to covid-19: Secondary | ICD-10-CM | POA: Insufficient documentation

## 2020-01-29 LAB — SARS CORONAVIRUS 2 (TAT 6-24 HRS): SARS Coronavirus 2: NEGATIVE

## 2020-01-30 ENCOUNTER — Other Ambulatory Visit (HOSPITAL_COMMUNITY): Payer: No Typology Code available for payment source

## 2020-01-31 ENCOUNTER — Encounter (HOSPITAL_COMMUNITY)
Admission: RE | Disposition: A | Discharge status: Home / Self Care | Payer: Self-pay | Source: Ambulatory Visit | Attending: Gastroenterology

## 2020-01-31 ENCOUNTER — Ambulatory Visit (HOSPITAL_COMMUNITY)
Admission: RE | Admit: 2020-01-31 | Discharge: 2020-01-31 | Disposition: A | Discharge status: Home / Self Care | Payer: No Typology Code available for payment source | Source: Ambulatory Visit | Attending: Gastroenterology | Admitting: Gastroenterology

## 2020-01-31 DIAGNOSIS — R159 Full incontinence of feces: Secondary | ICD-10-CM | POA: Diagnosis not present

## 2020-01-31 HISTORY — PX: ANAL RECTAL MANOMETRY: SHX6358

## 2020-01-31 SURGERY — MANOMETRY, ANORECTAL

## 2020-01-31 NOTE — Progress Notes (Signed)
Anal Manometry done per protocol. Pt tolerated well without distress or complication.  

## 2020-02-02 ENCOUNTER — Encounter: Payer: Self-pay | Admitting: *Deleted

## 2020-02-07 MED FILL — BUPROPION HCL ER (XL) 300 M: 300 | 90 days supply | Qty: 90 | Fill #1

## 2020-02-12 MED FILL — SAXENDA 18 MG/3 ML PEN: 18 | 30 days supply | Qty: 15 | Fill #0

## 2020-02-22 ENCOUNTER — Encounter: Payer: Self-pay | Admitting: Osteopathic Medicine

## 2020-02-23 MED FILL — OMEPRAZOLE DR 20 MG CAPSULE: 20 | 90 days supply | Qty: 90 | Fill #1

## 2020-02-23 MED FILL — ARIPiprazole 2 MG TABS: 2 | 30 days supply | Qty: 30 | Fill #1

## 2020-03-06 MED FILL — METHYLPREDNISOLONE 4 MG TBP: 4 | 6 days supply | Qty: 21 | Fill #0

## 2020-03-08 MED FILL — GABAPENTIN 300 MG CAPSULE: 300 | 30 days supply | Qty: 150 | Fill #1

## 2020-03-13 MED FILL — clonazePAM 0.5 MG TABS: 0.5 | 20 days supply | Qty: 20 | Fill #0

## 2020-03-13 MED FILL — ARIPiprazole 5 MG TABS: 5 | 30 days supply | Qty: 30 | Fill #0

## 2020-03-15 MED FILL — HYDROCHLOROTHIAZIDE 25 MG T: 25 | 90 days supply | Qty: 90 | Fill #1

## 2020-03-15 MED FILL — AMLODIPINE BESYLATE 5 MG TA: 5 | 90 days supply | Qty: 90 | Fill #1

## 2020-03-18 MED FILL — SAXENDA 18 MG/3 ML PEN: 18 | 30 days supply | Qty: 15 | Fill #0

## 2020-03-18 MED FILL — NOVOFINE 32G NEEDLES: 32G X 6 MM | 90 days supply | Qty: 100 | Fill #0

## 2020-03-19 ENCOUNTER — Ambulatory Visit (HOSPITAL_COMMUNITY): Payer: No Typology Code available for payment source | Admitting: Licensed Clinical Social Worker

## 2020-03-19 ENCOUNTER — Other Ambulatory Visit: Payer: Self-pay

## 2020-03-22 MED FILL — TEMAZEPAM 15 MG CAPSULE: 15 | 60 days supply | Qty: 60 | Fill #2

## 2020-04-01 ENCOUNTER — Other Ambulatory Visit: Payer: Self-pay | Admitting: Osteopathic Medicine

## 2020-04-01 DIAGNOSIS — I10 Essential (primary) hypertension: Secondary | ICD-10-CM

## 2020-04-03 ENCOUNTER — Other Ambulatory Visit: Payer: Self-pay

## 2020-04-03 ENCOUNTER — Encounter: Payer: Self-pay | Admitting: Osteopathic Medicine

## 2020-04-03 ENCOUNTER — Telehealth (HOSPITAL_COMMUNITY): Payer: No Typology Code available for payment source | Admitting: Psychiatry

## 2020-04-03 MED FILL — BYSTOLIC 20 MG TABLET: 20 | 90 days supply | Qty: 90 | Fill #0

## 2020-04-03 NOTE — Progress Notes (Signed)
Psychiatric Initial Adult Assessment   Patient Identification: Mary Stark MRN:  [id number] Date of Evaluation:  04/03/2020 Referral Source: Dr. Sherlie Ban Chief Complaint:  Anxiety.  I think come down. Visit Diagnosis: Major clinical depression   No diagnosis found.  History of Present Illness:   Today the patient is doing very well.  She continues to homeschool her 2 grandchildren and they are doing well.  Patient continues to work in the hospital.  She is having no problems.  She recently had some knee surgery which is minor and she is completely recovered.  She denies chest pain or shortness of breath.  She denies any signs of a viral infection.  Her mood is good.  She is sleeping and eating well and has good energy.  She enjoys life.  Unfortunately she drinks a bit too much alcohol.  She drinks 3 or 4 beers a night.  In the next week or 2 she is going to cut it back down to 1 or 2 because she has gained weight.  Once again we gave her the warning about the problems of drinking more than 2 beers a night.  The patient has no evidence of psychosis.  She takes her medicines as prescribed.  She is functioning very well.    Associated Signs/Symptoms: Depression Symptoms:  feelings of worthlessness/guilt, (Hypo) Manic Symptoms:   Anxiety Symptoms:   Psychotic Symptoms:   PTSD Symptoms:   Past Psychiatric History: multiple presents now taking Abilify 10 mg Wellbutrin  300 mg  Previous Psychotropic Medications: as above  Substance Abuse History in the last 12 months:  Yes.    Consequences of Substance Abuse: Medical Consequences:    Past Medical History:  Past Medical History:  Diagnosis Date  . Alcohol dependence ([place])   . Depression 07/17/2013  . History of hepatitis C 07/17/2013  . History of rheumatic fever   . Hyperlipidemia 07/17/2013  . Hypertension   . Tobacco use   . Toe fracture, right 10/07/2016  . Vitamin D deficiency 07/17/2013    Past Surgical History:   Procedure Laterality Date  . ANAL RECTAL MANOMETRY N/A 01/31/2020   Procedure: ANO RECTAL MANOMETRY;  Surgeon: Arta Silence, MD;  Location: [facility];  Service: Endoscopy;  Laterality: N/A;  . [place]  . FINGER SURGERY Right    5th  . GYNECOLOGIC CRYOSURGERY    . TONSILLECTOMY      Family Psychiatric History:   Family History:  Family History  Problem Relation Age of Onset  . Ovarian cancer Mother   . Cardiomyopathy Father   . Valvular heart disease Father   . Liver cancer Brother   . Protein C deficiency Brother   . Protein C deficiency Brother   . Stroke Brother     Social History:   Social History   Socioeconomic History  . Marital status: Married    Spouse name: Not on file  . Number of children: Not on file  . Years of education: Not on file  . Highest education level: Not on file  Occupational History  . Not on file  Tobacco Use  . Smoking status: Former Smoker    Packs/day: 1.00    Years: 25.00    Pack years: 25.00    Types: Cigarettes    Quit date: 2012    Years since quitting: 9.3  . Smokeless tobacco: Current User  . Tobacco comment: <1 cig per month  Substance and Sexual Activity  . Alcohol use:  Yes    Alcohol/week: 24.0 - 36.0 standard drinks    Types: 24 - 36 Cans of beer per week  . Drug use: No  . Sexual activity: Yes    Birth control/protection: None  Other Topics Concern  . Not on file  Social History Narrative  . Not on file   Social Determinants of Health   Financial Resource Strain:   . Difficulty of Paying Living Expenses:   Food Insecurity:   . Worried About Charity fundraiser in the Last Year:   . Arboriculturist in the Last Year:   Transportation Needs:   . Film/video editor (Medical):   Marland Kitchen Lack of Transportation (Non-Medical):   Physical Activity:   . Days of Exercise per Week:   . Minutes of Exercise per Session:   Stress:   . Feeling of Stress :   Social Connections:   . Frequency of  Communication with Friends and Family:   . Frequency of Social Gatherings with Friends and Family:   . Attends Religious Services:   . Active Member of Clubs or Organizations:   . Attends Archivist Meetings:   Marland Kitchen Marital Status:     Additional Social History:   Allergies:   Allergies  Allergen Reactions  . Metformin And Related Other (See Comments)    Lactic acidosis   . Celebrex [Celecoxib] Other (See Comments)    Dizziness, "makes me feel bad"  . Penicillins Hives and Rash    Has patient had a PCN reaction causing immediate rash, facial/tongue/throat swelling, SOB or lightheadedness with hypotension: Yes Has patient had a PCN reaction causing severe rash involving mucus membranes or skin necrosis:No Has patient had a PCN reaction that required hospitalization: Yes Has patient had a PCN reaction occurring within the last 10 years: No If all of the above answers are "NO", then may proceed with Cephalosporin use.     Metabolic Disorder Labs: Lab Results  Component Value Date   HGBA1C 5.6 02/07/2013   No results found for: PROLACTIN Lab Results  Component Value Date   CHOL 179 05/16/2019   TRIG 137 05/16/2019   HDL 57 05/16/2019   CHOLHDL 3.1 05/16/2019   VLDL 20 08/30/2013   LDLCALC 98 05/16/2019   LDLCALC 84 01/10/2018     Current Medications: Current Outpatient Medications  Medication Sig Dispense Refill  . acetaminophen (TYLENOL) 500 MG tablet Take 500 mg by mouth every 8 (eight) hours as needed.     Marland Kitchen amLODipine (NORVASC) 5 MG tablet TAKE 1 TABLET (5 MG TOTAL) BY MOUTH DAILY. 90 tablet 1  . ARIPiprazole (ABILIFY) 2 MG tablet Take 1 tablet (2 mg total) by mouth daily. 30 tablet 3  . Ascorbic Acid (VITAMIN C PO) Take 1 tablet by mouth daily.    . ASPIRIN ADULT LOW STRENGTH 81 MG EC tablet TAKE 1 TABLET (81 MG TOTAL) BY MOUTH DAILY. 90 tablet 3  . Benzphetamine HCl 50 MG TABS Take 50 mg by mouth daily.    Marland Kitchen buPROPion (WELLBUTRIN XL) 300 MG 24 hr tablet  Take 1 tablet (300 mg total) by mouth daily. 90 tablet 1  . BYSTOLIC 20 MG TABS TAKE 1 TABLET (20 MG TOTAL) BY MOUTH DAILY. 90 tablet 0  . CALCIUM PO Take 1 tablet by mouth daily.    . Coenzyme Q10 (CO Q 10 PO) Take by mouth.    . Cyanocobalamin (VITAMIN B 12 PO) Take 1 tablet by mouth daily.    Marland Kitchen  Diclofenac Sodium (PENNSAID) 2 % SOLN Apply 2 g topically 2 (two) times daily as needed (to affected area). 112 g 3  . gabapentin (NEURONTIN) 300 MG capsule 1  qam   4   qhs 150 capsule 5  . GLUCOSAMINE-CHONDROITIN PO Take 1 tablet by mouth daily.    . hydrochlorothiazide (HYDRODIURIL) 25 MG tablet Take 1 tablet (25 mg total) by mouth daily. 90 tablet 1  . meloxicam (MOBIC) 7.5 MG tablet Take 1 tablet (7.5 mg total) by mouth daily as needed for pain. 30 tablet 2  . Omega-3 Fatty Acids (FISH OIL PO) Take 1 tablet by mouth daily.    Marland Kitchen omeprazole (PRILOSEC) 20 MG capsule Take 1 capsule (20 mg total) by mouth daily. 90 capsule 1  . phentermine (ADIPEX-P) 37.5 MG tablet Take 37.5 mg by mouth 2 (two) times a day.    . potassium chloride SA (K-DUR,KLOR-CON) 20 MEQ tablet Take 1 tablet (20 mEq total) by mouth daily. 90 tablet 1  . Prasterone (INTRAROSA) 6.5 MG INST Place 1 application vaginally 2 (two) times a week. 84 each 11  . PROAIR HFA 108 (90 Base) MCG/ACT inhaler INHALE 1 TO 2 PUFFS BY MOUTH INTO THE LUNGS EVERY 4 HOURS AS NEEDED FOR WHEEZING OR SHORTNESS OF BREATH 8.5 g 1  . SAXENDA 18 MG/3ML SOPN Inject 0.6 mg into the skin daily.    . simvastatin (ZOCOR) 20 MG tablet TAKE 1 TABLET (20 MG TOTAL) BY MOUTH DAILY. 90 tablet 1  . temazepam (RESTORIL) 15 MG capsule Take 1 capsule (15 mg total) by mouth at bedtime as needed for sleep. 60 capsule 3  . traMADol (ULTRAM) 50 MG tablet Take 1 tablet (50 mg total) by mouth every 8 (eight) hours as needed for moderate pain or severe pain. 30 tablet 0  . VITAMIN E PO Take 1 tablet by mouth daily.     No current facility-administered medications for this visit.     Neurologic: Headache: No Seizure: No Paresthesias:No  Musculoskeletal: Strength & Muscle Tone: within normal limits Gait & Station: normal Patient leans: N/A  Psychiatric Specialty Exam: ROS  There were no vitals taken for this visit.There is no height or weight on file to calculate BMI.  General Appearance: Casual  Eye Contact:  Good  Speech:  NA  Volume:  Normal  Mood:  Negative  Affect:  Appropriate  Thought Process:  Goal Directed  Orientation:  Full (Time, Place, and Person)  Thought Content:  Logical  Suicidal Thoughts:  No  Homicidal Thoughts:  No  Memory:  NA Immediate;   Good  Judgement:  Good  Insight:  Good  Psychomotor Activity:  NA  Concentration:    Recall:  Good  Fund of Knowledge:Good  Language: Good  Akathisia:  No  Handed:  Right  AIMS (if indicated):    Assets:  Desire for Improvement  ADL's:  Intact  Cognition: WNL  Sleep:      Treatment Plan Summary: At this time the patient is doing well.  Her first problem is that of major depression and she takes Wellbutrin 300 mg.  She clearly gets a benefit from it.  She is enjoying life and her mood is stable.  Her second problem is alcohol dependency.  Patient will attempt to reduce her alcohol on her own.  She will continue taking 300 mg of Neurontin 1 in the morning and 4 at night.  Her third problem is that of insomnia.  She will take Restoril on a  as needed basis.  Seems to work well.  The patient is functioning well she is not suicidal she is positive and optimistic.  She will be seen again in 3 to 4 months.

## 2020-04-09 ENCOUNTER — Other Ambulatory Visit: Payer: Self-pay | Admitting: Physician Assistant

## 2020-04-09 ENCOUNTER — Other Ambulatory Visit: Payer: Self-pay | Admitting: Osteopathic Medicine

## 2020-04-09 DIAGNOSIS — I10 Essential (primary) hypertension: Secondary | ICD-10-CM

## 2020-04-09 MED FILL — SM ASPIRIN EC 81 MG TABLET: 81 | 90 days supply | Qty: 90 | Fill #0

## 2020-04-09 MED FILL — GABAPENTIN 300 MG CAPSULE: 300 | 30 days supply | Qty: 150 | Fill #2

## 2020-04-10 MED FILL — traMADol HCL 50 MG TABS: 50 | 5 days supply | Qty: 20 | Fill #0

## 2020-04-10 MED FILL — ARIPiprazole 5 MG TABS: 5 | 30 days supply | Qty: 30 | Fill #1

## 2020-04-11 MED FILL — SAXENDA 18 MG/3 ML PEN: 18 | 30 days supply | Qty: 15 | Fill #0

## 2020-04-11 MED FILL — NALTREXONE 50 MG TABLET: 50 | 30 days supply | Qty: 30 | Fill #0

## 2020-04-15 MED FILL — clonazePAM 0.5 MG TABS: 0.5 | 60 days supply | Qty: 60 | Fill #0

## 2020-04-24 ENCOUNTER — Encounter: Payer: Self-pay | Admitting: Osteopathic Medicine

## 2020-04-24 ENCOUNTER — Ambulatory Visit (INDEPENDENT_AMBULATORY_CARE_PROVIDER_SITE_OTHER): Payer: No Typology Code available for payment source

## 2020-04-24 ENCOUNTER — Other Ambulatory Visit: Payer: Self-pay | Admitting: Osteopathic Medicine

## 2020-04-24 ENCOUNTER — Ambulatory Visit (INDEPENDENT_AMBULATORY_CARE_PROVIDER_SITE_OTHER): Payer: No Typology Code available for payment source | Admitting: Osteopathic Medicine

## 2020-04-24 ENCOUNTER — Other Ambulatory Visit: Payer: Self-pay

## 2020-04-24 VITALS — BP 119/81 | HR 73 | Wt 193.0 lb

## 2020-04-24 DIAGNOSIS — N1330 Unspecified hydronephrosis: Secondary | ICD-10-CM

## 2020-04-24 DIAGNOSIS — K3189 Other diseases of stomach and duodenum: Secondary | ICD-10-CM

## 2020-04-24 NOTE — Progress Notes (Signed)
Mary Stark is a 64 y.o. female who presents to  [place] at [facility]  today, 04/24/20, seeking care for the following: . MRI results ?kidney problem - MRI L spine from ortho (see scnnaed docs) noted R hydronephrosis new since prior CT, recommended further study. Pt reports urinary frequency, history of presumed kidney stones years ago.      ASSESSMENT & PLAN with other pertinent history/findings:  The primary encounter diagnosis was Hydronephrosis of right kidney. A diagnosis of Hydronephrosis, unspecified hydronephrosis type was also pertinent to this visit.    Patient Instructions  Urine testing & CT today - we are looking for a stone or anything else that might be causing a blockage of the ureter.   Will probably end up having you see a urologist - depending on CT results.  If severe pain, nausea, fever, confusion or other concerns - please seek emergency medical attention!        Hydronephrosis  Hydronephrosis is the swelling of one or both kidneys due to a blockage that stops urine from flowing out of the body. Kidneys filter waste from the blood and produce urine. This condition can lead to kidney failure and may become life threatening if not treated promptly. What are the causes? Common causes of this condition include:  Problems that occur when a baby is developing in the womb (congenital defect). These can include problems: ? In the kidneys. ? In the tubes that drain urine from the kidneys into the bladder (ureters).  Kidney stones.  Bladder infection.  An enlarged prostate gland.  Scar tissue from a previous surgery or injury.  A blood clot.  A tumor or cyst in the abdomen or pelvis.  Cancer of the prostate, bladder, uterus, ovary, or colon. What are the signs or symptoms? Symptoms of this condition include:  Pain or discomfort in your side (flank).  Pain and swelling in your abdomen.  Nausea and  vomiting.  Fever.  Pain when passing urine.  Feelings of urgency when you need to urinate.  Urinating more often than normal. In some cases, you may not have any symptoms. How is this diagnosed? This condition may be diagnosed based on:  Your symptoms and medical history.  A physical exam.  Blood and urine tests.  Imaging tests, such as an ultrasound, CT scan, or MRI.  A procedure in which a scope is inserted into the urethra and used to view parts of the urinary tract and bladder (cystoscopy). How is this treated? Treatment for this condition depends on where the blockage is, how long it has been there, and what caused it. The goal of treatment is to remove the blockage. Treatment may include:  Antibiotic medicines to treat or prevent infection.  A procedure to place a small, thin tube (stent) into a blocked ureter. The stent will keep the ureter open so that urine can drain through it.  A nonsurgical procedure that crushes kidney stones with shock waves (extracorporeal shock wave lithotripsy).  If kidney failure occurs, treatment may include dialysis or a kidney transplant. Follow these instructions at home:   Take over-the-counter and prescription medicines only as told by your health care provider.  Rest and return to your normal activities as told by your health care provider. Ask your health care provider what activities are safe for you.  Drink enough fluid to keep your urine pale yellow.  If you were prescribed an antibiotic medicine, take it exactly as told  by your health care provider. Do not stop taking the antibiotic even if you start to feel better.  Keep all follow-up visits as told by your health care provider. This is important. Contact a health care provider if:  You continue to have symptoms after treatment.  You develop new symptoms.  Your urine becomes cloudy or bloody.  You have a fever. Get help right away if:  You have severe flank or  abdominal pain.  You cannot drink fluids without vomiting. Summary  Hydronephrosis is the swelling of one or both kidneys due to a blockage that stops urine from flowing out of the body.  Hydronephrosis can lead to kidney failure and may become life threatening if not treated promptly.  The goal of treatment is to treat the cause of the blockage. It may include insertion of stent into a blocked ureter, a procedure to treat kidney stones, and antibiotic medicines.  Follow your health care provider's instructions for taking care of yourself at home, including instructions about drinking fluids, taking medicines, and limiting activities. This information is not intended to replace advice given to you by your health care provider. Make sure you discuss any questions you have with your health care provider. Document Revised: 12/11/2017 Document Reviewed: 12/11/2017 Elsevier Patient Education  2020 [place] This Encounter  Procedures  . Urine Culture  . CT Abdomen Pelvis Wo Contrast  . Urinalysis, Routine w reflex microscopic    No orders of the defined types were placed in this encounter.      Follow-up instructions: Return for RECHECK PENDING RESULTS / IF WORSE OR CHANGE.                                         BP 119/81 (BP Location: Left Arm, Patient Position: Sitting, Cuff Size: Large)   Pulse 73   Wt 193 lb 0.6 oz (87.6 kg)   BMI 31.16 kg/m   Current Meds  Medication Sig  . acetaminophen (TYLENOL) 500 MG tablet Take 500 mg by mouth every 8 (eight) hours as needed.   Marland Kitchen amLODipine (NORVASC) 5 MG tablet TAKE 1 TABLET (5 MG TOTAL) BY MOUTH DAILY.  Marland Kitchen ARIPiprazole (ABILIFY) 2 MG tablet Take 1 tablet (2 mg total) by mouth daily.  . Ascorbic Acid (VITAMIN C PO) Take 1 tablet by mouth daily.  . ASPIRIN LOW DOSE 81 MG EC tablet TAKE 1 TABLET BY MOUTH DAILY.  Marland Kitchen Benzphetamine HCl 50 MG TABS Take 50 mg by mouth daily.    Marland Kitchen buPROPion (WELLBUTRIN XL) 300 MG 24 hr tablet Take 1 tablet (300 mg total) by mouth daily.  Marland Kitchen BYSTOLIC 20 MG TABS TAKE 1 TABLET (20 MG TOTAL) BY MOUTH DAILY.  Marland Kitchen CALCIUM PO Take 1 tablet by mouth daily.  . Coenzyme Q10 (CO Q 10 PO) Take by mouth.  . Cyanocobalamin (VITAMIN B 12 PO) Take 1 tablet by mouth daily.  . Diclofenac Sodium (PENNSAID) 2 % SOLN Apply 2 g topically 2 (two) times daily as needed (to affected area).  . gabapentin (NEURONTIN) 300 MG capsule 1  qam   4   qhs  . GLUCOSAMINE-CHONDROITIN PO Take 1 tablet by mouth daily.  . hydrochlorothiazide (HYDRODIURIL) 25 MG tablet Take 1 tablet (25 mg total) by mouth daily.  . meloxicam (MOBIC) 7.5 MG tablet Take 1 tablet (7.5 mg total) by mouth daily  as needed for pain.  . Omega-3 Fatty Acids (FISH OIL PO) Take 1 tablet by mouth daily.  Marland Kitchen omeprazole (PRILOSEC) 20 MG capsule Take 1 capsule (20 mg total) by mouth daily.  . phentermine (ADIPEX-P) 37.5 MG tablet Take 37.5 mg by mouth 2 (two) times a day.  . potassium chloride SA (K-DUR,KLOR-CON) 20 MEQ tablet Take 1 tablet (20 mEq total) by mouth daily.  . Prasterone (INTRAROSA) 6.5 MG INST Place 1 application vaginally 2 (two) times a week.  Marland Kitchen PROAIR HFA 108 (90 Base) MCG/ACT inhaler INHALE 1 TO 2 PUFFS BY MOUTH INTO THE LUNGS EVERY 4 HOURS AS NEEDED FOR WHEEZING OR SHORTNESS OF BREATH  . SAXENDA 18 MG/3ML SOPN Inject 0.6 mg into the skin daily.  . simvastatin (ZOCOR) 20 MG tablet TAKE 1 TABLET (20 MG TOTAL) BY MOUTH DAILY.  Marland Kitchen temazepam (RESTORIL) 15 MG capsule Take 1 capsule (15 mg total) by mouth at bedtime as needed for sleep.  . traMADol (ULTRAM) 50 MG tablet Take 1 tablet (50 mg total) by mouth every 8 (eight) hours as needed for moderate pain or severe pain.  Marland Kitchen VITAMIN E PO Take 1 tablet by mouth daily.    No results found for this or any previous visit (from the past 72 hour(s)).  No results found.  Depression screen [facility] 2/9 05/16/2019 03/02/2018 02/11/2018  Decreased Interest 0 0  3  Down, Depressed, Hopeless 1 0 3  PHQ - 2 Score 1 0 6  Altered sleeping 2 1 3   Tired, decreased energy 1 0 3  Change in appetite 2 1 2   Feeling bad or failure about yourself  1 0 2  Trouble concentrating 0 0 3  Moving slowly or fidgety/restless 0 0 2  Suicidal thoughts 0 0 0  PHQ-9 Score 7 2 21   Difficult doing work/chores - Not difficult at all Extremely dIfficult    GAD 7 : Generalized Anxiety Score 05/16/2019 03/02/2018 02/11/2018  Nervous, Anxious, on Edge 1 0 1  Control/stop worrying 2 0 2  Worry too much - different things 2 0 2  Trouble relaxing 2 0 3  Restless 0 0 2  Easily annoyed or irritable 1 0 1  Afraid - awful might happen 1 0 1  Total GAD 7 Score 9 0 12  Anxiety Difficulty - Not difficult at all Very difficult      All questions at time of visit were answered - patient instructed to contact office with any additional concerns or updates.  ER/RTC precautions were reviewed with the patient.  Please note: voice recognition software was used to produce this document, and typos may escape review. Please contact Dr. Sheppard Coil for any needed clarifications.

## 2020-04-24 NOTE — Patient Instructions (Signed)
Urine testing & CT today - we are looking for a stone or anything else that might be causing a blockage of the ureter.   Will probably end up having you see a urologist - depending on CT results.  If severe pain, nausea, fever, confusion or other concerns - please seek emergency medical attention!        Hydronephrosis  Hydronephrosis is the swelling of one or both kidneys due to a blockage that stops urine from flowing out of the body. Kidneys filter waste from the blood and produce urine. This condition can lead to kidney failure and may become life threatening if not treated promptly. What are the causes? Common causes of this condition include:  Problems that occur when a baby is developing in the womb (congenital defect). These can include problems: ? In the kidneys. ? In the tubes that drain urine from the kidneys into the bladder (ureters).  Kidney stones.  Bladder infection.  An enlarged prostate gland.  Scar tissue from a previous surgery or injury.  A blood clot.  A tumor or cyst in the abdomen or pelvis.  Cancer of the prostate, bladder, uterus, ovary, or colon. What are the signs or symptoms? Symptoms of this condition include:  Pain or discomfort in your side (flank).  Pain and swelling in your abdomen.  Nausea and vomiting.  Fever.  Pain when passing urine.  Feelings of urgency when you need to urinate.  Urinating more often than normal. In some cases, you may not have any symptoms. How is this diagnosed? This condition may be diagnosed based on:  Your symptoms and medical history.  A physical exam.  Blood and urine tests.  Imaging tests, such as an ultrasound, CT scan, or MRI.  A procedure in which a scope is inserted into the urethra and used to view parts of the urinary tract and bladder (cystoscopy). How is this treated? Treatment for this condition depends on where the blockage is, how long it has been there, and what caused it. The  goal of treatment is to remove the blockage. Treatment may include:  Antibiotic medicines to treat or prevent infection.  A procedure to place a small, thin tube (stent) into a blocked ureter. The stent will keep the ureter open so that urine can drain through it.  A nonsurgical procedure that crushes kidney stones with shock waves (extracorporeal shock wave lithotripsy).  If kidney failure occurs, treatment may include dialysis or a kidney transplant. Follow these instructions at home:   Take over-the-counter and prescription medicines only as told by your health care provider.  Rest and return to your normal activities as told by your health care provider. Ask your health care provider what activities are safe for you.  Drink enough fluid to keep your urine pale yellow.  If you were prescribed an antibiotic medicine, take it exactly as told by your health care provider. Do not stop taking the antibiotic even if you start to feel better.  Keep all follow-up visits as told by your health care provider. This is important. Contact a health care provider if:  You continue to have symptoms after treatment.  You develop new symptoms.  Your urine becomes cloudy or bloody.  You have a fever. Get help right away if:  You have severe flank or abdominal pain.  You cannot drink fluids without vomiting. Summary  Hydronephrosis is the swelling of one or both kidneys due to a blockage that stops urine from flowing out of  the body.  Hydronephrosis can lead to kidney failure and may become life threatening if not treated promptly.  The goal of treatment is to treat the cause of the blockage. It may include insertion of stent into a blocked ureter, a procedure to treat kidney stones, and antibiotic medicines.  Follow your health care provider's instructions for taking care of yourself at home, including instructions about drinking fluids, taking medicines, and limiting activities. This  information is not intended to replace advice given to you by your health care provider. Make sure you discuss any questions you have with your health care provider. Document Revised: 12/11/2017 Document Reviewed: 12/11/2017 Elsevier Patient Education  2020 [employer].

## 2020-04-26 ENCOUNTER — Telehealth: Payer: Self-pay

## 2020-04-26 DIAGNOSIS — K3189 Other diseases of stomach and duodenum: Secondary | ICD-10-CM

## 2020-04-26 LAB — URINALYSIS, ROUTINE W REFLEX MICROSCOPIC
Bacteria, UA: NONE SEEN /HPF
Bilirubin Urine: NEGATIVE
Glucose, UA: NEGATIVE
Hgb urine dipstick: NEGATIVE
Hyaline Cast: NONE SEEN /LPF
Ketones, ur: NEGATIVE
Nitrite: NEGATIVE
Protein, ur: NEGATIVE
RBC / HPF: NONE SEEN /HPF (ref 0–2)
Specific Gravity, Urine: 1.011 (ref 1.001–1.03)
pH: 6.5 (ref 5.0–8.0)

## 2020-04-26 LAB — URINE CULTURE
MICRO NUMBER:: 10471399
SPECIMEN QUALITY:: ADEQUATE

## 2020-04-26 NOTE — Telephone Encounter (Signed)
Opened in error

## 2020-04-26 NOTE — Telephone Encounter (Signed)
Pt is requesting that GI referral is placed with [facility]. She is currently a patient at that facility. Pls refer to provider's message for CT results for add'l information. Thanks.

## 2020-04-26 NOTE — Telephone Encounter (Signed)
Routing to provider. GI referral has not been order by the provider. Referral pended for review.

## 2020-04-29 MED ORDER — NITROFURANTOIN MONOHYD MACRO 100 MG PO CAPS: 100.0000 mg | ORAL_CAPSULE | Freq: Two times a day (BID) | ORAL | 0 refills | Status: DC

## 2020-04-29 MED FILL — NITROFURANTOIN MONO-MCR 100: 100 | 5 days supply | Qty: 10 | Fill #0

## 2020-04-29 NOTE — Addendum Note (Signed)
Addended by: Maryla Morrow on: 04/29/2020 09:32 AM   Modules accepted: Orders

## 2020-04-29 NOTE — Telephone Encounter (Signed)
If she's a patient she should also be able to cal lthem I'll place the order again

## 2020-04-29 NOTE — Telephone Encounter (Signed)
Pt needs a new referral for GI. Informed by [facility] referral on file has expired. Pls advise, thanks.

## 2020-04-30 ENCOUNTER — Telehealth: Payer: Self-pay

## 2020-04-30 NOTE — Telephone Encounter (Signed)
Pt called requesting to proceed with GI specialist at a [facility] facility. Per pt, she has to wait for an appt in June with her current GI specialist at [facility].

## 2020-05-01 NOTE — Telephone Encounter (Signed)
Reuben Likes with [facility] called and left vm that they recommend she see Urology ASAP and wanted a call back with the date of her appointment. They state they sent notes to Dr. Sheppard Coil. Please advise? Reuben Likes can be reached at [phone].

## 2020-05-02 ENCOUNTER — Telehealth: Payer: Self-pay

## 2020-05-02 NOTE — Telephone Encounter (Signed)
Left pt msg with Dr Redgie Grayer recommendations. Advised to call back if any questions or concers

## 2020-05-02 NOTE — Telephone Encounter (Signed)
If no serious reaction, would advise she just stay on this Rx, complete 5 days total treatment.

## 2020-05-02 NOTE — Telephone Encounter (Signed)
Pt called stating that she no longer wants to continue with Macrobid rx. Per pt, the antibiotic is making her sleep all day and night. She did three days of the medication but will be stopping it today. Requesting an alternative rx. Pls send to [place].

## 2020-05-07 NOTE — Telephone Encounter (Signed)
Reuben Likes called requesting whether pt was seen by the Urologist yet. Per Reuben Likes, the GI specialist requested for pt to be seen first. Informed Reuben Likes we will contact patient for an update. Called pt, she did confirmed that she did have an appointment with Urologist. Pt was informed to contact [facility] to update [place] of the results. Pt was agreeable with plan. No other inquiries during call.

## 2020-05-09 ENCOUNTER — Other Ambulatory Visit: Payer: Self-pay

## 2020-05-09 ENCOUNTER — Encounter: Payer: Self-pay | Admitting: Osteopathic Medicine

## 2020-05-09 ENCOUNTER — Ambulatory Visit (INDEPENDENT_AMBULATORY_CARE_PROVIDER_SITE_OTHER): Payer: No Typology Code available for payment source

## 2020-05-09 ENCOUNTER — Ambulatory Visit (INDEPENDENT_AMBULATORY_CARE_PROVIDER_SITE_OTHER): Payer: No Typology Code available for payment source | Admitting: Osteopathic Medicine

## 2020-05-09 VITALS — BP 129/78 | HR 69 | Temp 97.0°F | Wt 196.0 lb

## 2020-05-09 DIAGNOSIS — N12 Tubulo-interstitial nephritis, not specified as acute or chronic: Secondary | ICD-10-CM

## 2020-05-09 DIAGNOSIS — N1330 Unspecified hydronephrosis: Secondary | ICD-10-CM

## 2020-05-09 DIAGNOSIS — R1031 Right lower quadrant pain: Secondary | ICD-10-CM

## 2020-05-09 LAB — I-STAT CREATININE (MANUAL ENTRY): Creatinine, Ser: 1.3 — AB (ref 0.50–1.10)

## 2020-05-09 MED ORDER — IOHEXOL 300 MG/ML  SOLN
100.0000 mL | Freq: Once | INTRAMUSCULAR | Status: AC | PRN
  Administered 2020-05-09: 100 mL via INTRAVENOUS

## 2020-05-09 MED ORDER — CIPROFLOXACIN HCL 500 MG PO TABS: 500.0000 mg | ORAL_TABLET | Freq: Two times a day (BID) | ORAL | 0 refills | Status: AC

## 2020-05-09 MED ORDER — HYDROCODONE-ACETAMINOPHEN 5-325 MG PO TABS: 1.0000 | ORAL_TABLET | Freq: Three times a day (TID) | ORAL | 0 refills | Status: DC | PRN

## 2020-05-09 MED FILL — BUPROPION HCL ER (XL) 300 M: 300 | 30 days supply | Qty: 30 | Fill #0

## 2020-05-09 MED FILL — GABAPENTIN 300 MG CAPSULE: 300 | 30 days supply | Qty: 180 | Fill #0

## 2020-05-09 MED FILL — ARIPiprazole 5 MG TABS: 5 | 30 days supply | Qty: 30 | Fill #2

## 2020-05-09 MED FILL — HYDROCODON-APAP 5-325: 5-325 | 5 days supply | Qty: 20 | Fill #0

## 2020-05-09 NOTE — Progress Notes (Signed)
Mary Stark is a 64 y.o. female who presents to  [place] at [facility]  today, 05/09/20, seeking care for the following: . Abdominal pain ongoing a week, referring to hip, hurting to stand up  . Recent Dx severe R hydronephrosis, neg for stone, referrals in place to GI and Urology for hydronephrosis and questionable duodenal mass noted on CT abdomen/pelvis       ASSESSMENT & PLAN with other pertinent history/findings:  The primary encounter diagnosis was RLQ abdominal pain. A diagnosis of Hydronephrosis of right kidney was also pertinent to this visit.  ADDENDUM -reviewed CT results yesterday, see below.  Spoke with patient over the phone regarding results, sent ciprofloxacin for pyelonephritis.  Will send this information to urology, I think patient needs to be followed up there ASAP.   CT Abdomen Pelvis W Contrast  Result Date: 05/09/2020 CLINICAL DATA:  Persistent bilateral flank pain and tenderness. EXAM: CT ABDOMEN AND PELVIS WITH CONTRAST TECHNIQUE: Multidetector CT imaging of the abdomen and pelvis was performed using the standard protocol following bolus administration of intravenous contrast. CONTRAST:  172mL OMNIPAQUE IOHEXOL 300 MG/ML  SOLN COMPARISON:  CT scans dated 05/02/2020, 04/24/2020 and 04/07/2018 FINDINGS: Lower chest: No acute abnormality. Stable 3 mm nodule the right lung base on image 9 of series 6, unchanged since 04/07/2018. Hepatobiliary: No focal liver abnormality is seen. No gallstones, gallbladder wall thickening, or biliary dilatation. Pancreas: Unremarkable. No pancreatic ductal dilatation or surrounding inflammatory changes. Spleen: Normal in size without focal abnormality. Adrenals/Urinary Tract: There is persistent right hydronephrosis with increased abnormal enhancement of mucosa of the right ureter with obliteration of the lumen of the proximal right ureter. The distal ureter is not dilated. There is slight  prominence of the left ureter with enhancement of the left ureteral mucosa. Slight perinephric soft tissue stranding on the right is unchanged since 04/24/2020. Bladder appears normal. Stomach/Bowel: Again noted is slight soft tissue stranding around the third portion of the duodenum with multiple small periaortic lymph nodes at that level. Several of these are slightly more prominent than on the prior study of 04/24/2020. Tiny hiatal hernia. There are few diverticula in the distal colon. Terminal ileum and appendix are normal. Is slight soft tissue stranding in the pelvis adjacent to the cervix, unchanged. Vascular/Lymphatic: Slight aortic atherosclerosis. Small likely reactive lymph nodes in the periaortic region at the level of the third portion of the duodenum as described above, minimally more prominent than on the prior exam. Reproductive: Uterus and bilateral adnexa are unremarkable. Other: No abdominal wall hernia or abnormality. No abdominopelvic ascites. Musculoskeletal: No acute abnormality. Degenerative disc disease in the lower lumbar spine. IMPRESSION: 1. Persistent right hydronephrosis with increased abnormal enhancement of the mucosa of the right ureter with obliteration of the lumen of the proximal right ureter. This likely represents pyelonephritis. 2. Slight prominence of the left ureter with enhancement of the left ureteral mucosa consistent with pyelonephritis. 3. Slight soft tissue stranding around the third portion of the duodenum with multiple small periaortic lymph nodes at that level. Several of these are slightly more prominent than on the prior study of 04/24/2020. This suggests inflammation of the third portion of the duodenum as described on the prior study. Aortic Atherosclerosis (ICD10-I70.0). Electronically Signed   By: Lorriane Shire M.D.   On: 05/09/2020 17:15   Recent Results (from the past 2160 hour(s))  Urinalysis, Routine w reflex microscopic     Status: Abnormal    Collection Time:  04/24/20  3:09 PM  Result Value Ref Range   Color, Urine YELLOW YELLOW   APPearance CLEAR CLEAR   Specific Gravity, Urine 1.011 1.001 - 1.03   pH 6.5 5.0 - 8.0   Glucose, UA NEGATIVE NEGATIVE   Bilirubin Urine NEGATIVE NEGATIVE   Ketones, ur NEGATIVE NEGATIVE   Hgb urine dipstick NEGATIVE NEGATIVE   Protein, ur NEGATIVE NEGATIVE   Nitrite NEGATIVE NEGATIVE   Leukocytes,Ua 3+ (A) NEGATIVE   WBC, UA 10-20 (A) 0 - 5 /HPF   RBC / HPF NONE SEEN 0 - 2 /HPF   Squamous Epithelial / LPF 0-5 < OR = 5 /HPF   Bacteria, UA NONE SEEN NONE SEEN /HPF   Hyaline Cast NONE SEEN NONE SEEN /LPF  Urine Culture     Status: Abnormal   Collection Time: 04/24/20  3:09 PM   Specimen: Urine  Result Value Ref Range   MICRO NUMBER: [id number]    SPECIMEN QUALITY: Adequate    Sample Source URINE    STATUS: FINAL    ISOLATE 1: Enterococcus faecalis (A)     Comment: Greater than 100,000 CFU/mL of Enterococcus faecalis      Susceptibility   Enterococcus faecalis - URINE CULTURE POSITIVE 1    AMPICILLIN <=2 Sensitive     VANCOMYCIN 2 Sensitive     NITROFURANTOIN* <=16 Sensitive      * Legend:S = Susceptible  I = IntermediateR = Resistant  NS = Not susceptible* = Not tested  NR = Not reported**NN = See antimicrobic comments  I-STAT Creatinine (manual entry)     Status: Abnormal   Collection Time: 05/09/20  4:36 PM  Result Value Ref Range   Creatinine, Ser 1.30 (A) 0.50 - 1.10      Patient Instructions  Not sure what's going on but I'm concerned about complication/progression of an issue in the urinary tract. Let's get CT ASAP and go from there. If severe pain, TO ED!       Orders Placed This Encounter  Procedures  . CT Abdomen Pelvis W Contrast    Meds ordered this encounter  Medications  . HYDROcodone-acetaminophen (NORCO/VICODIN) 5-325 MG tablet    Sig: Take 1-2 tablets by mouth every 8 (eight) hours as needed for up to 5 days for moderate pain or severe pain.    Dispense:  20  tablet    Refill:  0  . ciprofloxacin (CIPRO) 500 MG tablet    Sig: Take 1 tablet (500 mg total) by mouth 2 (two) times daily for 7 days.    Dispense:  14 tablet    Refill:  0       Follow-up instructions: Return for RECHECK PENDING RESULTS / IF WORSE OR CHANGE.                                         BP 129/78 (BP Location: Left Arm, Patient Position: Sitting)   Pulse 69   Temp (!) 97 F (36.1 C)   Wt 196 lb (88.9 kg)   BMI 31.64 kg/m   Current Meds  Medication Sig  . acetaminophen (TYLENOL) 500 MG tablet Take 500 mg by mouth every 8 (eight) hours as needed.   Marland Kitchen amLODipine (NORVASC) 5 MG tablet TAKE 1 TABLET (5 MG TOTAL) BY MOUTH DAILY.  Marland Kitchen ARIPiprazole (ABILIFY) 2 MG tablet Take 1 tablet (2 mg total) by mouth daily.  Marland Kitchen  Ascorbic Acid (VITAMIN C PO) Take 1 tablet by mouth daily.  . ASPIRIN LOW DOSE 81 MG EC tablet TAKE 1 TABLET BY MOUTH DAILY.  Marland Kitchen Benzphetamine HCl 50 MG TABS Take 50 mg by mouth daily.  Marland Kitchen buPROPion (WELLBUTRIN XL) 300 MG 24 hr tablet Take 1 tablet (300 mg total) by mouth daily.  Marland Kitchen BYSTOLIC 20 MG TABS TAKE 1 TABLET (20 MG TOTAL) BY MOUTH DAILY.  Marland Kitchen CALCIUM PO Take 1 tablet by mouth daily.  . Coenzyme Q10 (CO Q 10 PO) Take by mouth.  . Cyanocobalamin (VITAMIN B 12 PO) Take 1 tablet by mouth daily.  . Diclofenac Sodium (PENNSAID) 2 % SOLN Apply 2 g topically 2 (two) times daily as needed (to affected area).  . gabapentin (NEURONTIN) 300 MG capsule 1  qam   4   qhs  . GLUCOSAMINE-CHONDROITIN PO Take 1 tablet by mouth daily.  . hydrochlorothiazide (HYDRODIURIL) 25 MG tablet Take 1 tablet (25 mg total) by mouth daily.  . meloxicam (MOBIC) 7.5 MG tablet Take 1 tablet (7.5 mg total) by mouth daily as needed for pain.  . nitrofurantoin, macrocrystal-monohydrate, (MACROBID) 100 MG capsule Take 1 capsule (100 mg total) by mouth 2 (two) times daily.  . Omega-3 Fatty Acids (FISH OIL PO) Take 1 tablet by mouth daily.  Marland Kitchen omeprazole  (PRILOSEC) 20 MG capsule Take 1 capsule (20 mg total) by mouth daily.  . potassium chloride SA (K-DUR,KLOR-CON) 20 MEQ tablet Take 1 tablet (20 mEq total) by mouth daily.  . Prasterone (INTRAROSA) 6.5 MG INST Place 1 application vaginally 2 (two) times a week.  Marland Kitchen PROAIR HFA 108 (90 Base) MCG/ACT inhaler INHALE 1 TO 2 PUFFS BY MOUTH INTO THE LUNGS EVERY 4 HOURS AS NEEDED FOR WHEEZING OR SHORTNESS OF BREATH  . SAXENDA 18 MG/3ML SOPN Inject 0.6 mg into the skin daily.  . simvastatin (ZOCOR) 20 MG tablet TAKE 1 TABLET (20 MG TOTAL) BY MOUTH DAILY.  Marland Kitchen temazepam (RESTORIL) 15 MG capsule Take 1 capsule (15 mg total) by mouth at bedtime as needed for sleep.  . traMADol (ULTRAM) 50 MG tablet Take 1 tablet (50 mg total) by mouth every 8 (eight) hours as needed for moderate pain or severe pain.  Marland Kitchen VITAMIN E PO Take 1 tablet by mouth daily.    No results found for this or any previous visit (from the past 72 hour(s)).   Depression screen [facility] 2/9 05/16/2019 03/02/2018 02/11/2018  Decreased Interest 0 0 3  Down, Depressed, Hopeless 1 0 3  PHQ - 2 Score 1 0 6  Altered sleeping 2 1 3   Tired, decreased energy 1 0 3  Change in appetite 2 1 2   Feeling bad or failure about yourself  1 0 2  Trouble concentrating 0 0 3  Moving slowly or fidgety/restless 0 0 2  Suicidal thoughts 0 0 0  PHQ-9 Score 7 2 21   Difficult doing work/chores - Not difficult at all Extremely dIfficult    GAD 7 : Generalized Anxiety Score 05/16/2019 03/02/2018 02/11/2018  Nervous, Anxious, on Edge 1 0 1  Control/stop worrying 2 0 2  Worry too much - different things 2 0 2  Trouble relaxing 2 0 3  Restless 0 0 2  Easily annoyed or irritable 1 0 1  Afraid - awful might happen 1 0 1  Total GAD 7 Score 9 0 12  Anxiety Difficulty - Not difficult at all Very difficult      All questions at time of  visit were answered - patient instructed to contact office with any additional concerns or updates.  ER/RTC precautions were reviewed with the  patient.  Please note: voice recognition software was used to produce this document, and typos may escape review. Please contact Dr. Sheppard Coil for any needed clarifications.

## 2020-05-09 NOTE — Patient Instructions (Signed)
Not sure what's going on but I'm concerned about complication/progression of an issue in the urinary tract. Let's get CT ASAP and go from there. If severe pain, TO ED!

## 2020-05-14 ENCOUNTER — Telehealth: Payer: Self-pay | Admitting: Osteopathic Medicine

## 2020-05-14 NOTE — Telephone Encounter (Signed)
-----   Message from Narda Rutherford, [place] sent at 05/10/2020  3:14 PM EDT ----- Masae did start the Cipro. She has not heard from urology. I will forward to Cindy/Jennifer.

## 2020-05-14 NOTE — Telephone Encounter (Signed)
I called [facility] and checked the status of the referral that was sent to them on 04-26-20 and they informed me that patient was seen on 04/29/20

## 2020-05-15 ENCOUNTER — Telehealth: Payer: Self-pay

## 2020-05-15 NOTE — Progress Notes (Signed)
Jenn called [facility] and patient was seen on 04/29/2020 - CF

## 2020-05-15 NOTE — Telephone Encounter (Signed)
Pt left a vm msg stating she will be sending provider FMLA paperwork to be completed. She stated that it will be easier for her if she has the form on hand when she is out of work.

## 2020-05-15 NOTE — Telephone Encounter (Signed)
Yes I know she was seen initially on 04/29/20. I saw her again HERE on 05/09/20 and she needs a follow-up with them AGAIN ASAP - I sent my office notes and CT scan results. They need to review these and call patient for reevaluation.

## 2020-05-17 ENCOUNTER — Other Ambulatory Visit: Payer: Self-pay

## 2020-05-17 MED ORDER — CIPROFLOXACIN HCL 500 MG PO TABS: 500.0000 mg | ORAL_TABLET | Freq: Two times a day (BID) | ORAL | 0 refills | Status: AC

## 2020-05-17 MED FILL — SIMVASTATIN 20 MG TABLET: 20 | 90 days supply | Qty: 90 | Fill #1

## 2020-05-17 NOTE — Telephone Encounter (Signed)
I can send in another round of Cipro, I pended the order, please call patient to confirm pharmacy and okay to go ahead and send it.    Can let her know: I think urology needs to get involved ASAP.  If Cipro started but feeling worse, would go to ER. I sent her records to urology last week and asked them to contact her as well, need to figure out why this infection is there in the first place and what to do about it if standard treatment is not improving her situation.  I really think they need to take over from here, I am happy to see patient as needed but I am out of ideas!

## 2020-05-17 NOTE — Telephone Encounter (Signed)
Patient called into office to let us know show has an appointment with GI, she says she is still having some flank pain and wondered if she should go back on the Cipro. Please advise, Thanks

## 2020-05-17 NOTE — Telephone Encounter (Signed)
Returned call to the patient, patient was seem at GI office was told to seek treatment before moving forward with GI plans. Patient says she has an appointment with urology. Patient requesting FMLA  paperwork. RX sent to [facility] [address] [place].

## 2020-05-20 ENCOUNTER — Encounter: Payer: Self-pay | Admitting: Osteopathic Medicine

## 2020-05-27 ENCOUNTER — Ambulatory Visit (INDEPENDENT_AMBULATORY_CARE_PROVIDER_SITE_OTHER): Payer: No Typology Code available for payment source | Admitting: Sports Medicine

## 2020-05-27 ENCOUNTER — Other Ambulatory Visit: Payer: Self-pay

## 2020-05-27 DIAGNOSIS — M1712 Unilateral primary osteoarthritis, left knee: Secondary | ICD-10-CM | POA: Diagnosis not present

## 2020-05-27 NOTE — Progress Notes (Signed)
° ° °  Procedures performed today:    Procedure: Real-time Ultrasound Guided  aspiration/injection of left knee Device: Samsung [id number]  Verbal informed consent obtained.  Time-out conducted.  Noted no overlying erythema, induration, or other signs of local infection.  Skin prepped in a sterile fashion.  Local anesthesia: Topical Ethyl chloride.  With sterile technique and under real time ultrasound guidance:  Using an 18-gauge needle aspirated 25 cc of clear, straw-colored fluid, syringe switched and 1 cc Kenalog 40, 2 cc lidocaine, 2 cc bupivacaine injected easily Completed without difficulty  Pain immediately resolved suggesting accurate placement of the medication.  Advised to call if fevers/chills, erythema, induration, drainage, or persistent bleeding.  Images permanently stored and available for review in the ultrasound unit.  Impression: Technically successful ultrasound guided injection.  Independent interpretation of notes and tests performed by another provider:   None.  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 She just had a course of viscosupplementation with Dr. Mardelle Matte at [facility] without much improvement. She has a significant effusion today, the knee was drained and injected with triamcinolone. We discussed nonsurgical options including PRP, genicular radiofrequency ablation, unloader brace (primarily medial compartment OA). We also discussed surgical options including going straight to total knee arthroplasty. We are going to see how things go with this injection, and we can certainly add an unloader brace if insufficient improvement.    ___________________________________________ Gwen Her. Dianah Field, M.D., ABFM.,  CAQSM. Primary Care and [place] Instructor of [employer] [facility] [employer]

## 2020-05-27 NOTE — Assessment & Plan Note (Signed)
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 She just had a course of viscosupplementation with Dr. Mardelle Matte at [facility] without much improvement. She has a significant effusion today, the knee was drained and injected with triamcinolone. We discussed nonsurgical options including PRP, genicular radiofrequency ablation, unloader brace (primarily medial compartment OA). We also discussed surgical options including going straight to total knee arthroplasty. We are going to see how things go with this injection, and we can certainly add an unloader brace if insufficient improvement.

## 2020-05-28 MED FILL — DULoxetine HCL 30 MG CPEP: 30 | 30 days supply | Qty: 30 | Fill #0

## 2020-05-29 ENCOUNTER — Encounter: Payer: Self-pay | Admitting: Osteopathic Medicine

## 2020-05-30 ENCOUNTER — Other Ambulatory Visit: Payer: Self-pay

## 2020-05-30 DIAGNOSIS — Z791 Long term (current) use of non-steroidal anti-inflammatories (NSAID): Secondary | ICD-10-CM

## 2020-05-30 MED ORDER — OMEPRAZOLE 20 MG PO CPDR: 20.0000 mg | DELAYED_RELEASE_CAPSULE | Freq: Every day | ORAL | 1 refills | Status: DC

## 2020-05-30 MED FILL — OMEPRAZOLE DR 20 MG CAPSULE: 20 | 90 days supply | Qty: 90 | Fill #0

## 2020-06-03 ENCOUNTER — Encounter: Payer: Self-pay | Admitting: Emergency Medicine

## 2020-06-03 ENCOUNTER — Other Ambulatory Visit: Payer: Self-pay

## 2020-06-03 ENCOUNTER — Emergency Department
Admission: EM | Admit: 2020-06-03 | Discharge: 2020-06-03 | Disposition: A | Discharge status: Home / Self Care | Payer: No Typology Code available for payment source | Source: Home / Self Care

## 2020-06-03 ENCOUNTER — Telehealth: Payer: Self-pay | Admitting: Osteopathic Medicine

## 2020-06-03 ENCOUNTER — Emergency Department (INDEPENDENT_AMBULATORY_CARE_PROVIDER_SITE_OTHER): Payer: No Typology Code available for payment source

## 2020-06-03 DIAGNOSIS — N1330 Unspecified hydronephrosis: Secondary | ICD-10-CM | POA: Diagnosis not present

## 2020-06-03 DIAGNOSIS — N1339 Other hydronephrosis: Secondary | ICD-10-CM | POA: Diagnosis not present

## 2020-06-03 DIAGNOSIS — R110 Nausea: Secondary | ICD-10-CM

## 2020-06-03 DIAGNOSIS — R358 Other polyuria: Secondary | ICD-10-CM | POA: Diagnosis not present

## 2020-06-03 DIAGNOSIS — R109 Unspecified abdominal pain: Secondary | ICD-10-CM

## 2020-06-03 DIAGNOSIS — N3000 Acute cystitis without hematuria: Secondary | ICD-10-CM

## 2020-06-03 DIAGNOSIS — R3589 Other polyuria: Secondary | ICD-10-CM

## 2020-06-03 LAB — POCT URINALYSIS DIP (MANUAL ENTRY)
Bilirubin, UA: NEGATIVE
Glucose, UA: NEGATIVE mg/dL
Ketones, POC UA: NEGATIVE mg/dL
Nitrite, UA: NEGATIVE
Protein Ur, POC: NEGATIVE mg/dL
Spec Grav, UA: 1.015 (ref 1.010–1.025)
Urobilinogen, UA: 0.2 E.U./dL
pH, UA: 7 (ref 5.0–8.0)

## 2020-06-03 MED ORDER — HYDROCODONE-ACETAMINOPHEN 5-325 MG PO TABS: 1.0000 | ORAL_TABLET | Freq: Two times a day (BID) | ORAL | 0 refills | Status: DC | PRN

## 2020-06-03 MED ORDER — NITROFURANTOIN MONOHYD MACRO 100 MG PO CAPS: 100.0000 mg | ORAL_CAPSULE | Freq: Two times a day (BID) | ORAL | 0 refills | Status: AC

## 2020-06-03 MED ORDER — KETOROLAC TROMETHAMINE 60 MG/2ML IM SOLN
30.0000 mg | Freq: Once | INTRAMUSCULAR | Status: AC
  Administered 2020-06-03: 30 mg via INTRAMUSCULAR

## 2020-06-03 MED ORDER — ONDANSETRON 4 MG PO TBDP: 4.0000 mg | ORAL_TABLET | Freq: Three times a day (TID) | ORAL | 0 refills | Status: DC | PRN

## 2020-06-03 NOTE — ED Triage Notes (Signed)
Low back pain, nausea, polyuria x 1 month, rt ureter is blocked.

## 2020-06-03 NOTE — Discharge Instructions (Signed)
  Norco/Vicodin (hydrocodone-acetaminophen) is a narcotic pain medication, do not combine these medications with others containing tylenol. While taking, do not drink alcohol, drive, or perform any other activities that requires focus while taking these medications.   Please take your antibiotic as prescribed. A urine culture has been sent to check the severity of your urinary infection and to determine if you are on the most appropriate antibiotic. The results should come back within 2-3 days. You will only be notified if a medication change is indicated.  Please follow up with family medicine or urology if not improving within 1 week, sooner if symptoms worsening.

## 2020-06-03 NOTE — ED Provider Notes (Signed)
Mary Stark CARE    CSN: [id number] Arrival date & time: 06/03/20  1328      History   Chief Complaint Chief Complaint  Patient presents with  . Back Pain    HPI Mary Stark is a 64 y.o. female.   HPI Mary Stark is a 64 y.o. female presenting to UC with c/o bilateral low back pain, nausea without vomiting, and polyuria for 1 month. Pain is aching and sore, worsening on Right side.  She was seen about 1 month ago, tx for a urinary infection but also advised her Right ureter was "blocked' causing inflammation of her kidneys. No hx of kidney stones. She saw [facility], Dr. Claudia Desanctis on 04/24/20 but has not been contacted for f/u since her most recent UTI on 5/27.  She was sent to GI, who performed an endoscopy, pt states was "normal." Denies fever, chills, vomiting.    Past Medical History:  Diagnosis Date  . Alcohol dependence ([place])   . Depression 07/17/2013  . History of hepatitis C 07/17/2013  . History of rheumatic fever   . Hyperlipidemia 07/17/2013  . Hypertension   . Tobacco use   . Toe fracture, right 10/07/2016  . Vitamin D deficiency 07/17/2013    Patient Active Problem List   Diagnosis Date Noted  . Tear of medial meniscus of left knee, current 07/13/2019  . Encounter for monitoring NSAID therapy 07/13/2019  . Internal derangement of knee involving posterior horn of lateral meniscus, left 07/13/2019  . Primary osteoarthritis of left knee 07/13/2019  . Renal insufficiency 07/13/2019  . Anxiety about health 03/22/2019  . Hypokalemia 03/21/2019  . Chronic pain of left knee 02/13/2019  . Essential hypertension with goal blood pressure less than 140/90 02/13/2019  . 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/2018  . Anemia due to blood loss, acute 04/14/2018  . Hypomagnesemia 04/14/2018  . Hypocalcemia 04/14/2018  . Tobacco abuse 04/08/2018  . Depression 04/07/2018  . AKI (acute kidney injury) ([place]) 04/07/2018  . Sepsis ([place]) 04/07/2018  . Acute colitis 04/07/2018  .  Avascular necrosis of humeral head, right ([place]) 03/30/2018  . NSAID long-term use 03/30/2018  . Encounter for weight loss counseling 03/30/2018  . Seborrheic keratoses 03/15/2018  . History of nonmelanoma skin cancer 03/02/2018  . Skin lesion of chest wall 03/02/2018  . Alcohol dependence with unspecified alcohol-induced disorder ([place]) 02/11/2018  . Severe episode of recurrent major depressive disorder, without psychotic features ([place]) 02/11/2018  . Transaminitis 01/06/2018  . Nicotine dependence 01/06/2018  . Heavy alcohol consumption 01/06/2018  . Encounter for monitoring statin therapy 01/06/2018  . Class 1 obesity due to excess calories with serious comorbidity in adult 01/06/2018  . Chronic pain syndrome 04/21/2017  . Chronic right shoulder pain 07/03/2015  . Vaginismus 04/12/2014  . Menopausal state 11/08/2013  . Family history of ovarian cancer 09/27/2013  . 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 Personal history of colonic polyps 07/18/2013  . Dyslipidemia, goal LDL below 100 07/17/2013  . Depression with anxiety 07/17/2013  . Hx of hepatitis C 07/17/2013  . Vitamin D deficiency 07/17/2013  . Right lumbar radiculopathy 07/17/2013  . History of alcohol dependence ([place]) 07/17/2013  . H/O: rheumatic fever 06/30/2012  . Insomnia 06/30/2012    Past Surgical History:  Procedure Laterality Date  . ANAL RECTAL MANOMETRY N/A 01/31/2020   Procedure: ANO RECTAL MANOMETRY;  Surgeon: Arta Silence, MD;  Location: [facility];  Service: Endoscopy;  Laterality: N/A;  . [place]  . FINGER  SURGERY Right    5th  . GYNECOLOGIC CRYOSURGERY    . TONSILLECTOMY      OB History   No obstetric history on file.      Home Medications    Prior to Admission medications   Medication Sig Start Date End Date Taking? Authorizing Provider  acetaminophen (TYLENOL) 500 MG tablet Take 1,000 mg by mouth every 6 (six) hours as needed for moderate pain.     [provider]  amLODipine (NORVASC) 5 MG tablet TAKE 1 TABLET (5 MG TOTAL) BY MOUTH DAILY. 11/21/19   [name], DO  ARIPiprazole (ABILIFY) 2 MG tablet Take 1 tablet (2 mg total) by mouth daily. Patient not taking: Reported on 06/05/2020 01/04/20   Norma Fredrickson, MD  ARIPiprazole (ABILIFY) 5 MG tablet Take 5 mg by mouth daily.    [provider]  Ascorbic Acid (VITAMIN C PO) Take 1 tablet by mouth daily. Patient not taking: Reported on 06/05/2020    [provider]  ascorbic acid (VITAMIN C) 500 MG tablet Take 1,000 mg by mouth daily.    [provider]  ASPIRIN LOW DOSE 81 MG EC tablet TAKE 1 TABLET BY MOUTH DAILY. Patient taking differently: Take 81 mg by mouth daily.  04/09/20   [name], DO  Benzphetamine HCl 50 MG TABS Take 50 mg by mouth daily. Patient not taking: Reported on 06/05/2020    [provider]  buPROPion (WELLBUTRIN XL) 300 MG 24 hr tablet Take 1 tablet (300 mg total) by mouth daily. 11/22/19   Plovsky, Berneta Sages, MD  BYSTOLIC 20 MG TABS TAKE 1 TABLET (20 MG TOTAL) BY MOUTH DAILY. Patient taking differently: Take 20 mg by mouth daily.  04/01/20   [name], DO  CALCIUM PO Take 1 tablet by mouth daily. Patient not taking: Reported on 06/05/2020    [provider]  Cholecalciferol (VITAMIN D) 125 MCG (5000 UT) CAPS Take 5,000 Units by mouth daily.    [provider]  clonazePAM (KLONOPIN) 0.5 MG tablet Take 0.5 mg by mouth daily. 04/15/20   [provider]  Coenzyme Q10 (CO Q 10 PO) Take by mouth. Patient not taking: Reported on 06/05/2020    [provider]  Coenzyme Q10 (CO Q-10) 200 MG CAPS Take 200 mg by mouth daily.    [provider]  Cyanocobalamin (VITAMIN B 12 PO) Take 1 tablet by mouth daily. Patient not taking: Reported on 06/05/2020    [provider]  Diclofenac Sodium (PENNSAID) 2 % SOLN Apply 2 g topically 2 (two) times daily as needed (to affected  area). Patient not taking: Reported on 06/05/2020 05/04/19   Leandrew Koyanagi, MD  DULoxetine (CYMBALTA) 30 MG capsule Take 30 mg by mouth daily.    [provider]  fexofenadine (ALLEGRA) 180 MG tablet Take 180 mg by mouth daily.    [provider]  gabapentin (NEURONTIN) 300 MG capsule 1  qam   4   qhs Patient taking differently: Take 300-1,200 mg by mouth See admin instructions. Take 300 mg by mouth in the morning and afternoon and 1200 mg at night 11/22/19   Plovsky, Berneta Sages, MD  GLUCOSAMINE-CHONDROITIN PO Take 2 tablets by mouth daily.     [provider]  hydrochlorothiazide (HYDRODIURIL) 25 MG tablet Take 1 tablet (25 mg total) by mouth daily. 10/05/19   [name], DO  HYDROcodone-acetaminophen (NORCO/VICODIN) 5-325 MG tablet Take 1 tablet by mouth every 12 (twelve) hours as needed. 06/03/20  Gerarda Fraction, Kveon Casanas O, PA-C  nitrofurantoin, macrocrystal-monohydrate, (MACROBID) 100 MG capsule Take 1 capsule (100 mg total) by mouth 2 (two) times daily for 7 days. 06/03/20 06/10/20  Noe Gens, PA-C  Omega-3 Fatty Acids (FISH OIL) 1200 MG CAPS Take 1,200 mg by mouth daily.     [provider]  omeprazole (PRILOSEC) 20 MG capsule Take 1 capsule (20 mg total) by mouth daily. 05/30/20   [name], DO  ondansetron (ZOFRAN-ODT) 4 MG disintegrating tablet Take 1 tablet (4 mg total) by mouth every 8 (eight) hours as needed for nausea or vomiting. 06/03/20   Noe Gens, PA-C  phentermine (ADIPEX-P) 37.5 MG tablet Take 37.5 mg by mouth 2 (two) times a day. Patient not taking: Reported on 06/05/2020    [provider]  potassium chloride SA (K-DUR,KLOR-CON) 20 MEQ tablet Take 1 tablet (20 mEq total) by mouth daily. Patient not taking: Reported on 06/05/2020 03/21/19   Trixie Dredge, PA-C  PROAIR HFA 108 [phone] Base) MCG/ACT inhaler INHALE 1 TO 2 PUFFS BY MOUTH INTO THE LUNGS EVERY 4 HOURS AS NEEDED FOR WHEEZING OR SHORTNESS OF BREATH Patient taking  differently: Inhale 1-2 puffs into the lungs every 4 (four) hours as needed for wheezing or shortness of breath.  03/02/19   Trixie Dredge, PA-C  SAXENDA 18 MG/3ML SOPN Inject 3 mg into the skin daily.  02/28/19   [provider]  simvastatin (ZOCOR) 20 MG tablet TAKE 1 TABLET (20 MG TOTAL) BY MOUTH DAILY. Patient taking differently: Take 20 mg by mouth daily.  01/24/20   [name], DO  temazepam (RESTORIL) 15 MG capsule Take 1 capsule (15 mg total) by mouth at bedtime as needed for sleep. Patient not taking: Reported on 06/05/2020 11/22/19   Norma Fredrickson, MD  vitamin B-12 (CYANOCOBALAMIN) 1000 MCG tablet Take 2,000 mcg by mouth daily.    [provider]  Vitamin E 180 MG (400 UNIT) CAPS Take 400 Units by mouth daily.     [provider]    Family History Family History  Problem Relation Age of Onset  . Ovarian cancer Mother   . Cardiomyopathy Father   . Valvular heart disease Father   . Liver cancer Brother   . Protein C deficiency Brother   . Protein C deficiency Brother   . Stroke Brother     Social History Social History   Tobacco Use  . Smoking status: Former Smoker    Packs/day: 1.00    Years: 25.00    Pack years: 25.00    Types: Cigarettes    Quit date: 2012    Years since quitting: 9.4  . Smokeless tobacco: Current User  . Tobacco comment: <1 cig per month  Vaping Use  . Vaping Use: Every day  Substance Use Topics  . Alcohol use: Yes    Alcohol/week: 24.0 - 36.0 standard drinks    Types: 24 - 36 Cans of beer per week  . Drug use: No     Allergies   Metformin and related, Celebrex [celecoxib], and Penicillins   Review of Systems Review of Systems  Constitutional: Negative for chills and fever.  HENT: Negative for congestion, ear pain, sore throat, trouble swallowing and voice change.   Respiratory: Negative for cough and shortness of breath.   Cardiovascular: Negative for chest pain and palpitations.   Gastrointestinal: Positive for nausea. Negative for abdominal pain, diarrhea and vomiting.  Genitourinary: Positive for flank pain and frequency. Negative for decreased urine volume,  dysuria and urgency.  Musculoskeletal: Positive for back pain. Negative for arthralgias and myalgias.  Skin: Negative for rash.  All other systems reviewed and are negative.    Physical Exam Triage Vital Signs ED Triage Vitals  Enc Vitals Group     BP 06/03/20 1345 (!) 146/82     Pulse Rate 06/03/20 1345 64     Resp --      Temp 06/03/20 1345 97.9 F (36.6 C)     Temp Source 06/03/20 1345 Oral     SpO2 06/03/20 1345 98 %     Weight 06/03/20 1346 200 lb (90.7 kg)     Height 06/03/20 1346 5\' 6"  (1.676 m)     Head Circumference --      Peak Flow --      Pain Score 06/03/20 1346 7     Pain Loc --      Pain Edu? --      Excl. in [place]? --    No data found.  Updated Vital Signs BP (!) 146/82 (BP Location: Right Arm)   Pulse 64   Temp 97.9 F (36.6 C) (Oral)   Ht 5\' 6"  (1.676 m)   Wt 200 lb (90.7 kg)   SpO2 98%   BMI 32.28 kg/m   Visual Acuity Right Eye Distance:   Left Eye Distance:   Bilateral Distance:    Right Eye Near:   Left Eye Near:    Bilateral Near:     Physical Exam Vitals and nursing note reviewed.  Constitutional:      General: She is not in acute distress.    Appearance: Normal appearance. She is well-developed. She is not ill-appearing, toxic-appearing or diaphoretic.  HENT:     Head: Normocephalic and atraumatic.     Nose: Nose normal.     Mouth/Throat:     Mouth: Mucous membranes are moist.  Cardiovascular:     Rate and Rhythm: Normal rate and regular rhythm.  Pulmonary:     Effort: Pulmonary effort is normal. No respiratory distress.     Breath sounds: Normal breath sounds.  Abdominal:     General: There is no distension.     Palpations: Abdomen is soft. There is no mass.     Tenderness: There is no abdominal tenderness. There is right CVA tenderness and left  CVA tenderness. There is no guarding or rebound.     Hernia: No hernia is present.  Musculoskeletal:        General: Normal range of motion.     Cervical back: Normal range of motion.  Skin:    General: Skin is warm and dry.  Neurological:     Mental Status: She is alert and oriented to person, place, and time.  Psychiatric:        Behavior: Behavior normal.      UC Treatments / Results  Labs (all labs ordered are listed, but only abnormal results are displayed) Labs Reviewed  POCT URINALYSIS DIP (MANUAL ENTRY) - Abnormal; Notable for the following components:      Result Value   Blood, UA trace-intact (*)    Leukocytes, UA Moderate (2+) (*)    All other components within normal limits  URINE CULTURE    EKG   Radiology No results found.  Procedures Procedures (including critical care time)  Medications Ordered in UC Medications  ketorolac (TORADOL) injection 30 mg (30 mg Intramuscular Given 06/03/20 1416)    Initial Impression / Assessment and Plan / UC Course  I have reviewed the triage vital signs and the nursing notes.  Pertinent labs & imaging results that were available during my care of the patient were reviewed by me and considered in my medical decision making (see chart for details).     UA c/w UTI Will start on macrobid based off last urine culture Culture sent Discussed [place] with her PCP Will have pt f/u with [facility] Discussed symptoms that warrant emergent care in the ED. AVS provided  Final Clinical Impressions(s) / UC Diagnoses   Final diagnoses:  Polyuria  Bilateral hydronephrosis  Acute cystitis without hematuria  Bilateral flank pain  Nausea without vomiting     Discharge Instructions      Norco/Vicodin (hydrocodone-acetaminophen) is a narcotic pain medication, do not combine these medications with others containing tylenol. While taking, do not drink alcohol, drive, or perform any other activities that requires focus while  taking these medications.   Please take your antibiotic as prescribed. A urine culture has been sent to check the severity of your urinary infection and to determine if you are on the most appropriate antibiotic. The results should come back within 2-3 days. You will only be notified if a medication change is indicated.  Please follow up with family medicine or urology if not improving within 1 week, sooner if symptoms worsening.      ED Prescriptions    Medication Sig Dispense Auth. Provider   nitrofurantoin, macrocrystal-monohydrate, (MACROBID) 100 MG capsule Take 1 capsule (100 mg total) by mouth 2 (two) times daily for 7 days. 14 capsule Leeroy Cha O, [place]   HYDROcodone-acetaminophen (NORCO/VICODIN) 5-325 MG tablet Take 1 tablet by mouth every 12 (twelve) hours as needed. 5 tablet Gerarda Fraction, Imogene [place] O, PA-C   ondansetron (ZOFRAN-ODT) 4 MG disintegrating tablet Take 1 tablet (4 mg total) by mouth every 8 (eight) hours as needed for nausea or vomiting. 12 tablet Noe Gens, [place]     I have reviewed the PDMP during this encounter.   Noe Gens, [place] 06/06/20 [phone]

## 2020-06-03 NOTE — Telephone Encounter (Signed)
Spoke w/ Dr Claudia Desanctis (urology) 06/03/20 today approx 4 PM She will call patient to discuss current symptoms, try to get her in to be seen soon GI pathology pending from duodenal biopsy  Dr Claudia Desanctis also considering stent but not sure it will be helpful in setting of hydronephrosis not d/t nephrolithiasis

## 2020-06-04 MED FILL — HYDROCODON-APAP 5-325: 5-325 | 5 days supply | Qty: 20 | Fill #0

## 2020-06-05 ENCOUNTER — Other Ambulatory Visit: Payer: Self-pay | Admitting: Urology

## 2020-06-05 LAB — URINE CULTURE
MICRO NUMBER:: 10617107
Result:: NO GROWTH
SPECIMEN QUALITY:: ADEQUATE

## 2020-06-06 NOTE — Patient Instructions (Addendum)
DUE TO COVID-19 ONLY ONE VISITOR IS ALLOWED TO COME WITH YOU AND STAY IN THE WAITING ROOM ONLY DURING PRE OP AND PROCEDURE DAY OF SURGERY. THE 1 VISITOR MAY VISIT WITH YOU AFTER SURGERY IN YOUR PRIVATE ROOM DURING VISITING HOURS ONLY!   ONCE YOUR COVID TEST IS COMPLETED, PLEASE BEGIN THE QUARANTINE INSTRUCTIONS AS OUTLINED IN YOUR HANDOUT.                Mary Stark   Your procedure is scheduled on: 06/11/20   Report to [facility] Main  Entrance   Report to Short stay 5:30 AM     Call this number if you have problems the morning of surgery [phone]    Remember: Do not eat food or drink liquids :After Midnight.   BRUSH YOUR TEETH MORNING OF SURGERY AND RINSE YOUR MOUTH OUT, NO CHEWING GUM CANDY OR MINTS.     Take these medicines the morning of surgery with A SIP OF WATER: Clonazepam, Abilify, Wellbutrin, Cymbalta, Bystolic, Amlodipine, Prilosec, Gabapentin,                                  You may not have any metal on your body including hair pins and              piercings  Do not wear jewelry, make-up, lotions, powders or perfumes, deodorant             Do not wear nail polish on your fingernails.             Do not shave  48 hours prior to surgery.            Do not bring valuables to the hospital. [place].  Contacts, dentures or bridgework may not be worn into surgery.      Patients discharged the day of surgery will not be allowed to drive home.   IF YOU ARE HAVING SURGERY AND GOING HOME THE SAME DAY, YOU MUST HAVE AN ADULT TO DRIVE YOU HOME AND BE WITH YOU FOR 24 HOURS.   YOU MAY GO HOME BY TAXI OR UBER OR ORTHERWISE, BUT AN ADULT MUST ACCOMPANY YOU HOME AND STAY WITH YOU FOR 24 HOURS.  Name and phone number of your driver:  Special Instructions: N/A              Please read over the following fact sheets you were given: _____________________________________________________________________              [facility] - Preparing for Surgery  Before surgery, you can play an important role.   Because skin is not sterile, your skin needs to be as free of germs as possible.   You can reduce the number of germs on your skin by washing with CHG (chlorahexidine gluconate) soap before surgery .  CHG is an antiseptic cleaner which kills germs and bonds with the skin to continue killing germs even after washing. Please DO NOT use if you have an allergy to CHG or antibacterial soaps.   If your skin becomes reddened/irritated stop using the CHG and inform your nurse when you arrive at Short Stay. Do not shave (including legs and underarms) for at least 48 hours prior to the first CHG shower.    Please follow these instructions carefully:  1.  Shower with CHG Soap the night before surgery and the  morning of Surgery.  2.  If you choose to wash your hair, wash your hair first as usual with your  normal  shampoo.  3.  After you shampoo, rinse your hair and body thoroughly to remove the  shampoo.                                        4.  Use CHG as you would any other liquid soap.  You can apply chg directly  to the skin and wash                       Gently with a scrungie or clean washcloth.  5.  Apply the CHG Soap to your body ONLY FROM THE NECK DOWN.   Do not use on face/ open                           Wound or open sores. Avoid contact with eyes, ears mouth and genitals (private parts).                       Wash face,  Genitals (private parts) with your normal soap.             6.  Wash thoroughly, paying special attention to the area where your surgery  will be performed.  7.  Thoroughly rinse your body with warm water from the neck down.  8.  DO NOT shower/wash with your normal soap after using and rinsing off  the CHG Soap.             9.  Pat yourself dry with a clean towel.            10.  Wear clean pajamas.            11.  Place clean sheets on your bed the night of your first shower  and do not  sleep with pets. Day of Surgery : Do not apply any lotions/deodorants the morning of surgery.  Please wear clean clothes to the hospital/surgery center.  FAILURE TO FOLLOW THESE INSTRUCTIONS MAY RESULT IN THE CANCELLATION OF YOUR SURGERY PATIENT SIGNATURE_________________________________  NURSE SIGNATURE__________________________________  ________________________________________________________________________

## 2020-06-06 NOTE — H&P (View-Only) (Signed)
CC/HPI: cc: right hydronephrosis   06/04/20: 64 year old woman initially seen last month for incidental finding of asymptomatic right hydronephrosis. Since then, she has developed bilateral flank pain and been treated for pyelonephritis x2. She has had 2 CT scans and a renal ultrasound both which show right greater than left hydronephrosis and inflammation of the proximal ureter. There is also concern for inflammation of duodenum that is possibly causing inflammation of the ureters. Patient most recently went back to urgent care yesterday with acute onset right flank pain. Ultrasound at that point showed bilateral mild hydronephrosis. She is currently on nitrofurantoin for suspected infection. She did have endoscopy by GI with biopsy and we are waiting results.   04/29/20: 64 year old woman referred for right hydronephrosis without findings of stones. She had incidental finding of hydronephrosis on an MRI that was obtained and then CT scan confirmed. She denies hematuria, acute onset flank pain or history of kidney stones. She did report which she describes as ovarian cramping last week. She does have nocturia every 2 hours but drinks mostly diet Dr. Malachi Bonds, few cups of coffee and tea.     ALLERGIES: Metformin penicillin    MEDICATIONS: Aspirin 81 mg tablet,chewable  Abilify  Bystolic  Klonopin  Neurontin  Norvasc  Restoril  Wellbutrin Sr     GU PSH: Locm 300-399Mg /Ml Iodine,1Ml - 05/02/2020     NON-GU PSH: Hand/finger Surgery Tonsillectomy         GU PMH: Hydronephrosis, Based on noncontrast CT the abdomen pelvis is difficult to further evaluate ureters. We discussed obtaining a BMP to look at renal function and proceeding with a CT urogram. If hydronephrosis has resolved then no further investigating will be done however if hydronephrosis is still present we did discuss proceeding with cystoscopy, retrograde pyelogram and diagnostic ureteroscopy. I will follow-up the patient after the CT  scan. - 04/29/2020    NON-GU PMH: Anxiety Arthritis Depression Encounter for general adult medical examination without abnormal findings, Encounter for preventive health examination Hepatitis C Hypercholesterolemia Hypertension    FAMILY HISTORY: Kidney Stones - Grandfather Protein S Deficiency - Brother   SOCIAL HISTORY: Marital Status: Married Preferred Language: English; Ethnicity: Not Hispanic Or Latino; Race: White Current Smoking Status: Patient does not smoke anymore.  <DIV'  Tobacco Use Assessment Completed:  Used Tobacco in last 30 days?   Social Drinker.  Drinks 4+ caffeinated drinks per day.    REVIEW OF SYSTEMS:     GU Review Female:  Patient denies frequent urination, hard to postpone urination, burning /pain with urination, get up at night to urinate, leakage of urine, stream starts and stops, trouble starting your stream, have to strain to urinate, and being pregnant.    Gastrointestinal (Upper):  Patient reports nausea. Patient denies vomiting and indigestion/ heartburn.    Gastrointestinal (Lower):  Patient denies constipation and diarrhea.    Constitutional:  Patient denies fever, night sweats, weight loss, and fatigue.    Skin:  Patient denies skin rash/ lesion and itching.    Eyes:  Patient denies blurred vision and double vision.    Ears/ Nose/ Throat:  Patient denies sore throat and sinus problems.    Hematologic/Lymphatic:  Patient denies swollen glands and easy bruising.    Cardiovascular:  Patient denies leg swelling and chest pains.    Respiratory:  Patient denies cough and shortness of breath.    Endocrine:  Patient denies excessive thirst.    Musculoskeletal:  R flank pain. Patient reports back pain. Patient denies joint pain.  Neurological:  Patient denies headaches and dizziness.    Psychologic:  Patient denies depression and anxiety.    VITAL SIGNS:       06/04/2020 01:17 PM     Weight 200 lb / 90.72 kg     Height 66 in / 167.64 cm     BP  109/71 mmHg     Pulse 66 /min     Temperature 96.9 F / 36.0 C     BMI 32.3 kg/m     MULTI-SYSTEM PHYSICAL EXAMINATION:      Constitutional: Well-nourished. No physical deformities. Normally developed. Good grooming.     Neck: Neck symmetrical, not swollen. Normal tracheal position.     Respiratory: No labored breathing, no use of accessory muscles.      Cardiovascular: Normal temperature     Skin: No paleness, no jaundice, no cyanosis. No lesion, no ulcer, no rash.     Neurologic / Psychiatric: Oriented to time, oriented to place, oriented to person. No depression, no anxiety, no agitation.     Gastrointestinal: No mass, no tenderness, no rigidity, non obese abdomen. No CVA tenderness bilaterally.     Eyes: Normal conjunctivae. Normal eyelids.     Ears, Nose, Mouth, and Throat: Left ear no scars, no lesions, no masses. Right ear no scars, no lesions, no masses. Nose no scars, no lesions, no masses. Normal hearing. Normal lips.     Musculoskeletal: Normal gait and station of head and neck.            Complexity of Data:   Records Review:  Previous Hospital Records, POC Tool  Urine Test Review:  Urinalysis, Urine Culture  X-Ray Review: Renal Ultrasound: Reviewed Films. Reviewed Report. Discussed With Patient. Mild bilateral hydronephrosis seen C.T. Abdomen/Pelvis: Reviewed Films. Reviewed Report. Discussed With Patient.     Notes:  05/20/2020: BUN 30, creatinine 1.49  04/29/2020: Estimated GFR 53.8.   PROCEDURES:    Urinalysis w/Scope  Dipstick Dipstick Cont'd Micro  Color: Yellow Bilirubin: Neg mg/dL WBC/hpf: 0 - 5/hpf  Appearance: Clear Ketones: Neg mg/dL RBC/hpf: 0 - 2/hpf  Specific Gravity: 1.020 Blood: Neg ery/uL Bacteria: Mod (26-50/hpf)  pH: 6.0 Protein: Neg mg/dL Cystals: NS (Not Seen)  Glucose: Neg mg/dL Urobilinogen: 0.2 mg/dL Casts: NS (Not Seen)   Nitrites: Neg Trichomonas: Not Present   Leukocyte Esterase: 3+ leu/uL Mucous: Not Present    Epithelial Cells: 0 -  5/hpf    Yeast: NS (Not Seen)    Sperm: Not Present    ASSESSMENT:     ICD-10 Details  1 GU:  Hydronephrosis - N13.0 Bilateral, Undiagnosed New Problem - Patient has bilateral hydronephrosis without known cause. At 1st she is asymptomatic however now she is having episodic flank pain. I discussed with her proceeding with cystoscopy, bilateral retrograde pyelogram and diagnostic ureteroscopy with stent placement. I discussed with patient that stents may help her flank pain and preserve renal function. Her biopsy results from GI are also pending still.  2  Flank Pain - J85.63 Acute, Uncomplicated

## 2020-06-06 NOTE — H&P (Signed)
CC/HPI: cc: right hydronephrosis   06/04/20: 64 year old woman initially seen last month for incidental finding of asymptomatic right hydronephrosis. Since then, she has developed bilateral flank pain and been treated for pyelonephritis x2. She has had 2 CT scans and a renal ultrasound both which show right greater than left hydronephrosis and inflammation of the proximal ureter. There is also concern for inflammation of duodenum that is possibly causing inflammation of the ureters. Patient most recently went back to urgent care yesterday with acute onset right flank pain. Ultrasound at that point showed bilateral mild hydronephrosis. She is currently on nitrofurantoin for suspected infection. She did have endoscopy by GI with biopsy and we are waiting results.   04/29/20: 64 year old woman referred for right hydronephrosis without findings of stones. She had incidental finding of hydronephrosis on an MRI that was obtained and then CT scan confirmed. She denies hematuria, acute onset flank pain or history of kidney stones. She did report which she describes as ovarian cramping last week. She does have nocturia every 2 hours but drinks mostly diet Dr. Malachi Bonds, few cups of coffee and tea.     ALLERGIES: Metformin penicillin    MEDICATIONS: Aspirin 81 mg tablet,chewable  Abilify  Bystolic  Klonopin  Neurontin  Norvasc  Restoril  Wellbutrin Sr     GU PSH: Locm 300-399Mg /Ml Iodine,1Ml - 05/02/2020     NON-GU PSH: Hand/finger Surgery Tonsillectomy         GU PMH: Hydronephrosis, Based on noncontrast CT the abdomen pelvis is difficult to further evaluate ureters. We discussed obtaining a BMP to look at renal function and proceeding with a CT urogram. If hydronephrosis has resolved then no further investigating will be done however if hydronephrosis is still present we did discuss proceeding with cystoscopy, retrograde pyelogram and diagnostic ureteroscopy. I will follow-up the patient after the CT  scan. - 04/29/2020    NON-GU PMH: Anxiety Arthritis Depression Encounter for general adult medical examination without abnormal findings, Encounter for preventive health examination Hepatitis C Hypercholesterolemia Hypertension    FAMILY HISTORY: Kidney Stones - Grandfather Protein S Deficiency - Brother   SOCIAL HISTORY: Marital Status: Married Preferred Language: English; Ethnicity: Not Hispanic Or Latino; Race: White Current Smoking Status: Patient does not smoke anymore.  <DIV'  Tobacco Use Assessment Completed:  Used Tobacco in last 30 days?   Social Drinker.  Drinks 4+ caffeinated drinks per day.    REVIEW OF SYSTEMS:     GU Review Female:  Patient denies frequent urination, hard to postpone urination, burning /pain with urination, get up at night to urinate, leakage of urine, stream starts and stops, trouble starting your stream, have to strain to urinate, and being pregnant.    Gastrointestinal (Upper):  Patient reports nausea. Patient denies vomiting and indigestion/ heartburn.    Gastrointestinal (Lower):  Patient denies constipation and diarrhea.    Constitutional:  Patient denies fever, night sweats, weight loss, and fatigue.    Skin:  Patient denies skin rash/ lesion and itching.    Eyes:  Patient denies blurred vision and double vision.    Ears/ Nose/ Throat:  Patient denies sore throat and sinus problems.    Hematologic/Lymphatic:  Patient denies swollen glands and easy bruising.    Cardiovascular:  Patient denies leg swelling and chest pains.    Respiratory:  Patient denies cough and shortness of breath.    Endocrine:  Patient denies excessive thirst.    Musculoskeletal:  R flank pain. Patient reports back pain. Patient denies joint pain.  Neurological:  Patient denies headaches and dizziness.    Psychologic:  Patient denies depression and anxiety.    VITAL SIGNS:       06/04/2020 01:17 PM     Weight 200 lb / 90.72 kg     Height 66 in / 167.64 cm     BP  109/71 mmHg     Pulse 66 /min     Temperature 96.9 F / 36.0 C     BMI 32.3 kg/m     MULTI-SYSTEM PHYSICAL EXAMINATION:      Constitutional: Well-nourished. No physical deformities. Normally developed. Good grooming.     Neck: Neck symmetrical, not swollen. Normal tracheal position.     Respiratory: No labored breathing, no use of accessory muscles.      Cardiovascular: Normal temperature     Skin: No paleness, no jaundice, no cyanosis. No lesion, no ulcer, no rash.     Neurologic / Psychiatric: Oriented to time, oriented to place, oriented to person. No depression, no anxiety, no agitation.     Gastrointestinal: No mass, no tenderness, no rigidity, non obese abdomen. No CVA tenderness bilaterally.     Eyes: Normal conjunctivae. Normal eyelids.     Ears, Nose, Mouth, and Throat: Left ear no scars, no lesions, no masses. Right ear no scars, no lesions, no masses. Nose no scars, no lesions, no masses. Normal hearing. Normal lips.     Musculoskeletal: Normal gait and station of head and neck.            Complexity of Data:   Records Review:  Previous Hospital Records, POC Tool  Urine Test Review:  Urinalysis, Urine Culture  X-Ray Review: Renal Ultrasound: Reviewed Films. Reviewed Report. Discussed With Patient. Mild bilateral hydronephrosis seen C.T. Abdomen/Pelvis: Reviewed Films. Reviewed Report. Discussed With Patient.     Notes:  05/20/2020: BUN 30, creatinine 1.49  04/29/2020: Estimated GFR 53.8.   PROCEDURES:    Urinalysis w/Scope  Dipstick Dipstick Cont'd Micro  Color: Yellow Bilirubin: Neg mg/dL WBC/hpf: 0 - 5/hpf  Appearance: Clear Ketones: Neg mg/dL RBC/hpf: 0 - 2/hpf  Specific Gravity: 1.020 Blood: Neg ery/uL Bacteria: Mod (26-50/hpf)  pH: 6.0 Protein: Neg mg/dL Cystals: NS (Not Seen)  Glucose: Neg mg/dL Urobilinogen: 0.2 mg/dL Casts: NS (Not Seen)   Nitrites: Neg Trichomonas: Not Present   Leukocyte Esterase: 3+ leu/uL Mucous: Not Present    Epithelial Cells: 0 -  5/hpf    Yeast: NS (Not Seen)    Sperm: Not Present    ASSESSMENT:     ICD-10 Details  1 GU:  Hydronephrosis - N13.0 Bilateral, Undiagnosed New Problem - Patient has bilateral hydronephrosis without known cause. At 1st she is asymptomatic however now she is having episodic flank pain. I discussed with her proceeding with cystoscopy, bilateral retrograde pyelogram and diagnostic ureteroscopy with stent placement. I discussed with patient that stents may help her flank pain and preserve renal function. Her biopsy results from GI are also pending still.  2  Flank Pain - F41.42 Acute, Uncomplicated

## 2020-06-07 ENCOUNTER — Encounter (HOSPITAL_COMMUNITY)
Admission: RE | Admit: 2020-06-07 | Discharge: 2020-06-07 | Disposition: A | Discharge status: Home / Self Care | Payer: No Typology Code available for payment source | Source: Ambulatory Visit | Attending: Urology | Admitting: Urology

## 2020-06-07 ENCOUNTER — Encounter (HOSPITAL_COMMUNITY): Payer: Self-pay

## 2020-06-07 ENCOUNTER — Other Ambulatory Visit: Payer: Self-pay

## 2020-06-07 ENCOUNTER — Other Ambulatory Visit (HOSPITAL_COMMUNITY)
Admission: RE | Admit: 2020-06-07 | Discharge: 2020-06-07 | Disposition: A | Discharge status: Home / Self Care | Payer: No Typology Code available for payment source | Source: Ambulatory Visit | Attending: Urology | Admitting: Urology

## 2020-06-07 DIAGNOSIS — Z20822 Contact with and (suspected) exposure to covid-19: Secondary | ICD-10-CM | POA: Diagnosis not present

## 2020-06-07 DIAGNOSIS — Z01818 Encounter for other preprocedural examination: Secondary | ICD-10-CM | POA: Diagnosis not present

## 2020-06-07 HISTORY — DX: Malignant (primary) neoplasm, unspecified: C80.1

## 2020-06-07 HISTORY — DX: Unspecified osteoarthritis, unspecified site: M19.90

## 2020-06-07 LAB — CBC
HCT: 33.1 % — ABNORMAL LOW (ref 36.0–46.0)
Hemoglobin: 10.8 g/dL — ABNORMAL LOW (ref 12.0–15.0)
MCH: 31.5 pg (ref 26.0–34.0)
MCHC: 32.6 g/dL (ref 30.0–36.0)
MCV: 96.5 fL (ref 80.0–100.0)
Platelets: 252 10*3/uL (ref 150–400)
RBC: 3.43 MIL/uL — ABNORMAL LOW (ref 3.87–5.11)
RDW: 12.5 % (ref 11.5–15.5)
WBC: 9.9 10*3/uL (ref 4.0–10.5)
nRBC: 0 % (ref 0.0–0.2)

## 2020-06-07 LAB — COMPREHENSIVE METABOLIC PANEL
ALT: 14 U/L (ref 0–44)
AST: 15 U/L (ref 15–41)
Albumin: 4.2 g/dL (ref 3.5–5.0)
Alkaline Phosphatase: 66 U/L (ref 38–126)
Anion gap: 12 (ref 5–15)
BUN: 28 mg/dL — ABNORMAL HIGH (ref 8–23)
CO2: 26 mmol/L (ref 22–32)
Calcium: 9.3 mg/dL (ref 8.9–10.3)
Chloride: 93 mmol/L — ABNORMAL LOW (ref 98–111)
Creatinine, Ser: 1.23 mg/dL — ABNORMAL HIGH (ref 0.44–1.00)
GFR calc Af Amer: 54 mL/min — ABNORMAL LOW (ref >60)
GFR calc non Af Amer: 47 mL/min — ABNORMAL LOW (ref >60)
Glucose, Bld: 91 mg/dL (ref 70–99)
Potassium: 4.3 mmol/L (ref 3.5–5.1)
Sodium: 131 mmol/L — ABNORMAL LOW (ref 135–145)
Total Bilirubin: 0.5 mg/dL (ref 0.3–1.2)
Total Protein: 7.8 g/dL (ref 6.5–8.1)

## 2020-06-07 LAB — SARS CORONAVIRUS 2 (TAT 6-24 HRS): SARS Coronavirus 2: NEGATIVE

## 2020-06-07 NOTE — Progress Notes (Addendum)
COVID Vaccine Completed:Yes Date COVID Vaccine completed:01/01/20 COVID vaccine manufacturer: [place]     PCP -[name] DO  Cardiologist - no  Chest x-ray - no EKG - 06/07/20 Stress Test - no ECHO - no Cardiac Cath - no  Sleep Study - no CPAP -   Fasting Blood Sugar - NA Checks Blood Sugar _____ times a day  Blood Thinner Instructions:ASA/ Dr. Sheppard Coil Aspirin Instructions:Stop 7 days prior to DOS/ Dr. Claudia Desanctis Last Dose:06/04/20  Anesthesia review:   Patient denies shortness of breath, fever, cough and chest pain at PAT appointment yes   Patient verbalized understanding of instructions that were given to them at the PAT appointment. Patient was also instructed that they will need to review over the PAT instructions again at home before surgery. Yes.  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 She has no SOB climbing stairs or doing house work. She also reports that she stopped drinking in march of 2021.

## 2020-06-10 ENCOUNTER — Encounter (HOSPITAL_COMMUNITY): Payer: Self-pay | Admitting: Urology

## 2020-06-10 ENCOUNTER — Encounter: Payer: Self-pay | Admitting: Osteopathic Medicine

## 2020-06-10 NOTE — Anesthesia Preprocedure Evaluation (Addendum)
Anesthesia Evaluation  Patient identified by MRN, date of birth, ID band Patient awake    Reviewed: Allergy & Precautions, NPO status , Patient's Chart, lab work & pertinent test results  Airway Mallampati: II  TM Distance: >3 FB Neck ROM: Full    Dental no notable dental hx. (+) Teeth Intact, Dental Advisory Given   Pulmonary neg pulmonary ROS, former smoker,    Pulmonary exam normal breath sounds clear to auscultation       Cardiovascular hypertension, Pt. on medications Normal cardiovascular exam Rhythm:Regular Rate:Normal     Neuro/Psych PSYCHIATRIC DISORDERS Anxiety Depression  Neuromuscular disease    GI/Hepatic negative GI ROS, (+) Hepatitis -, C  Endo/Other  negative endocrine ROS  Renal/GU Renal diseaseLab Results      Component                Value               Date                      CREATININE               1.23 (H)            06/07/2020                BUN                      28 (H)              06/07/2020                NA                       131 (L)             06/07/2020                K                        4.3                 06/07/2020                CL                       93 (L)              06/07/2020                CO2                      26                  06/07/2020               Musculoskeletal  (+) Arthritis ,   Abdominal (+) + obese,   Peds  Hematology  (+) anemia , Lab Results      Component                Value               Date                      WBC  9.9                 06/07/2020                HGB                      10.8 (L)            06/07/2020                HCT                      33.1 (L)            06/07/2020                MCV                      96.5                06/07/2020                PLT                      252                 06/07/2020              Anesthesia Other Findings   Reproductive/Obstetrics                             Anesthesia Physical Anesthesia Plan  ASA: III  Anesthesia Plan: General   Post-op Pain Management:    Induction: Intravenous  PONV Risk Score and Plan: 3 and Treatment may vary due to age or medical condition, Ondansetron, Dexamethasone and Midazolam  Airway Management Planned: LMA  Additional Equipment: None  Intra-op Plan:   Post-operative Plan:   Informed Consent: I have reviewed the patients History and Physical, chart, labs and discussed the procedure including the risks, benefits and alternatives for the proposed anesthesia with the patient or authorized representative who has indicated his/her understanding and acceptance.     Interpreter used for [employer] Discussed with: CRNA  Anesthesia Plan Comments:        Anesthesia Quick Evaluation

## 2020-06-11 ENCOUNTER — Ambulatory Visit (HOSPITAL_COMMUNITY): Payer: No Typology Code available for payment source

## 2020-06-11 ENCOUNTER — Encounter (HOSPITAL_COMMUNITY): Payer: Self-pay | Admitting: Urology

## 2020-06-11 ENCOUNTER — Ambulatory Visit (HOSPITAL_COMMUNITY): Payer: No Typology Code available for payment source | Admitting: Anesthesiology

## 2020-06-11 ENCOUNTER — Ambulatory Visit (HOSPITAL_COMMUNITY)
Admission: RE | Admit: 2020-06-11 | Discharge: 2020-06-11 | Disposition: A | Discharge status: Home / Self Care | Payer: No Typology Code available for payment source | Source: Ambulatory Visit | Attending: Urology | Admitting: Urology

## 2020-06-11 ENCOUNTER — Encounter (HOSPITAL_COMMUNITY)
Admission: RE | Disposition: A | Discharge status: Home / Self Care | Payer: Self-pay | Source: Ambulatory Visit | Attending: Urology

## 2020-06-11 ENCOUNTER — Other Ambulatory Visit (HOSPITAL_COMMUNITY): Payer: No Typology Code available for payment source

## 2020-06-11 DIAGNOSIS — N1330 Unspecified hydronephrosis: Secondary | ICD-10-CM | POA: Insufficient documentation

## 2020-06-11 DIAGNOSIS — Z7982 Long term (current) use of aspirin: Secondary | ICD-10-CM | POA: Diagnosis not present

## 2020-06-11 DIAGNOSIS — Z880 Allergy status to penicillin: Secondary | ICD-10-CM | POA: Insufficient documentation

## 2020-06-11 DIAGNOSIS — I10 Essential (primary) hypertension: Secondary | ICD-10-CM | POA: Diagnosis not present

## 2020-06-11 DIAGNOSIS — M1990 Unspecified osteoarthritis, unspecified site: Secondary | ICD-10-CM | POA: Diagnosis not present

## 2020-06-11 DIAGNOSIS — F419 Anxiety disorder, unspecified: Secondary | ICD-10-CM | POA: Insufficient documentation

## 2020-06-11 DIAGNOSIS — Z79899 Other long term (current) drug therapy: Secondary | ICD-10-CM | POA: Insufficient documentation

## 2020-06-11 DIAGNOSIS — Z888 Allergy status to other drugs, medicaments and biological substances status: Secondary | ICD-10-CM | POA: Diagnosis not present

## 2020-06-11 DIAGNOSIS — F329 Major depressive disorder, single episode, unspecified: Secondary | ICD-10-CM | POA: Diagnosis not present

## 2020-06-11 DIAGNOSIS — Z87891 Personal history of nicotine dependence: Secondary | ICD-10-CM | POA: Diagnosis not present

## 2020-06-11 HISTORY — PX: CYSTOSCOPY WITH RETROGRADE PYELOGRAM, URETEROSCOPY AND STENT PLACEMENT: SHX5789

## 2020-06-11 SURGERY — CYSTOURETEROSCOPY, WITH RETROGRADE PYELOGRAM AND STENT INSERTION
Anesthesia: General | Laterality: Bilateral

## 2020-06-11 MED ORDER — OXYBUTYNIN CHLORIDE 5 MG PO TABS: 5.0000 mg | ORAL_TABLET | Freq: Three times a day (TID) | ORAL | 0 refills | Status: AC | PRN

## 2020-06-11 MED ORDER — OXYCODONE HCL 5 MG PO TABS
5.0000 mg | ORAL_TABLET | Freq: Once | ORAL | Status: AC | PRN
  Administered 2020-06-11: 5 mg via ORAL

## 2020-06-11 MED ORDER — OXYCODONE HCL 5 MG PO TABS
ORAL_TABLET | ORAL | Status: AC
  Filled 2020-06-11: qty 1

## 2020-06-11 MED ORDER — ACETAMINOPHEN 10 MG/ML IV SOLN: 1000.0000 mg | Freq: Once | INTRAVENOUS | Status: DC | PRN

## 2020-06-11 MED ORDER — PROPOFOL 10 MG/ML IV BOLUS
INTRAVENOUS | Status: DC | PRN
  Administered 2020-06-11: 160 mg via INTRAVENOUS

## 2020-06-11 MED ORDER — LACTATED RINGERS IV SOLN
INTRAVENOUS | Status: DC
  Administered 2020-06-11: 1000 mL via INTRAVENOUS

## 2020-06-11 MED ORDER — FENTANYL CITRATE (PF) 100 MCG/2ML IJ SOLN
25.0000 ug | INTRAMUSCULAR | Status: DC | PRN
  Administered 2020-06-11: 50 ug via INTRAVENOUS

## 2020-06-11 MED ORDER — CIPROFLOXACIN IN D5W 400 MG/200ML IV SOLN
400.0000 mg | Freq: Once | INTRAVENOUS | Status: AC
  Administered 2020-06-11: 400 mg via INTRAVENOUS

## 2020-06-11 MED ORDER — LIDOCAINE HCL (CARDIAC) PF 100 MG/5ML IV SOSY
PREFILLED_SYRINGE | INTRAVENOUS | Status: DC | PRN
  Administered 2020-06-11: 100 mg via INTRAVENOUS

## 2020-06-11 MED ORDER — FENTANYL CITRATE (PF) 100 MCG/2ML IJ SOLN
INTRAMUSCULAR | Status: AC
  Filled 2020-06-11: qty 2

## 2020-06-11 MED ORDER — EPHEDRINE SULFATE-NACL 50-0.9 MG/10ML-% IV SOSY
PREFILLED_SYRINGE | INTRAVENOUS | Status: DC | PRN
  Administered 2020-06-11: 10 mg via INTRAVENOUS
  Administered 2020-06-11: 5 mg via INTRAVENOUS
  Administered 2020-06-11: 10 mg via INTRAVENOUS

## 2020-06-11 MED ORDER — MIDAZOLAM HCL 5 MG/5ML IJ SOLN
INTRAMUSCULAR | Status: DC | PRN
  Administered 2020-06-11: 2 mg via INTRAVENOUS

## 2020-06-11 MED ORDER — OXYCODONE HCL 5 MG/5ML PO SOLN: 5.0000 mg | Freq: Once | ORAL | Status: AC | PRN

## 2020-06-11 MED ORDER — IOHEXOL 300 MG/ML  SOLN
INTRAMUSCULAR | Status: DC | PRN
  Administered 2020-06-11: 38 mL

## 2020-06-11 MED ORDER — SODIUM CHLORIDE 0.9 % IR SOLN
Status: DC | PRN
  Administered 2020-06-11 (×2): 3000 mL

## 2020-06-11 MED ORDER — ONDANSETRON HCL 4 MG/2ML IJ SOLN: 4.0000 mg | Freq: Once | INTRAMUSCULAR | Status: DC | PRN

## 2020-06-11 MED ORDER — MIDAZOLAM HCL 2 MG/2ML IJ SOLN
INTRAMUSCULAR | Status: AC
  Filled 2020-06-11: qty 2

## 2020-06-11 MED ORDER — DEXAMETHASONE SODIUM PHOSPHATE 4 MG/ML IJ SOLN
INTRAMUSCULAR | Status: DC | PRN
  Administered 2020-06-11: 10 mg via INTRAVENOUS

## 2020-06-11 MED ORDER — PROPOFOL 10 MG/ML IV BOLUS
INTRAVENOUS | Status: AC
  Filled 2020-06-11: qty 40

## 2020-06-11 MED ORDER — CIPROFLOXACIN IN D5W 400 MG/200ML IV SOLN
INTRAVENOUS | Status: AC
  Filled 2020-06-11: qty 200

## 2020-06-11 MED ORDER — CHLORHEXIDINE GLUCONATE 0.12 % MT SOLN
15.0000 mL | Freq: Once | OROMUCOSAL | Status: AC
  Administered 2020-06-11: 15 mL via OROMUCOSAL

## 2020-06-11 MED ORDER — ONDANSETRON HCL 4 MG/2ML IJ SOLN
INTRAMUSCULAR | Status: DC | PRN
  Administered 2020-06-11: 4 mg via INTRAVENOUS

## 2020-06-11 MED ORDER — ORAL CARE MOUTH RINSE: 15.0000 mL | Freq: Once | OROMUCOSAL | Status: AC

## 2020-06-11 MED ORDER — FENTANYL CITRATE (PF) 100 MCG/2ML IJ SOLN
INTRAMUSCULAR | Status: DC | PRN
  Administered 2020-06-11: 25 ug via INTRAVENOUS

## 2020-06-11 MED FILL — OXYBUTYNIN 5 MG TABLET: 5 | 30 days supply | Qty: 90 | Fill #0

## 2020-06-11 SURGICAL SUPPLY — 18 items
BAG URO CATCHER STRL LF (MISCELLANEOUS) ×2 IMPLANT
BASKET ZERO TIP NITINOL 2.4FR (BASKET) IMPLANT
BRUSH URET BIOPSY 3F (UROLOGICAL SUPPLIES) ×2 IMPLANT
CATH URET 5FR 28IN OPEN ENDED (CATHETERS) ×4 IMPLANT
CLOTH BEACON ORANGE TIMEOUT ST (SAFETY) ×2 IMPLANT
DRSG TEGADERM 2-3/8X2-3/4 SM (GAUZE/BANDAGES/DRESSINGS) ×2 IMPLANT
EXTRACTOR STONE 1.7FRX115CM (UROLOGICAL SUPPLIES) IMPLANT
GLOVE BIO SURGEON STRL SZ 6.5 (GLOVE) ×2 IMPLANT
GOWN STRL REUS W/TWL LRG LVL3 (GOWN DISPOSABLE) ×2 IMPLANT
GUIDEWIRE STR DUAL SENSOR (WIRE) ×2 IMPLANT
KIT TURNOVER KIT A (KITS) IMPLANT
MANIFOLD NEPTUNE II (INSTRUMENTS) ×2 IMPLANT
PACK CYSTO (CUSTOM PROCEDURE TRAY) ×2 IMPLANT
SHEATH URETERAL 12FRX28CM (UROLOGICAL SUPPLIES) IMPLANT
SHEATH URETERAL 12FRX35CM (MISCELLANEOUS) IMPLANT
STENT URET 6FRX26 CONTOUR (STENTS) ×2 IMPLANT
TUBING CONNECTING 10 (TUBING) ×2 IMPLANT
TUBING UROLOGY SET (TUBING) ×2 IMPLANT

## 2020-06-11 NOTE — Anesthesia Postprocedure Evaluation (Signed)
Anesthesia Post Note  Patient: Mary Stark  Procedure(s) Performed: CYSTOSCOPY WITH RETROGRADE PYELOGRAM, URETEROSCOPY AND STENT PLACEMENT, RIGHT URETERAL BIOPSY (Bilateral )     Anesthesia Type: General   No complications documented.  Last Vitals:  Vitals:   06/11/20 0915 06/11/20 0948  BP: 121/74 133/86  Pulse: (!) 55 61  Resp: 15 18  Temp:    SpO2: 97% 100%    Last Pain:  Vitals:   06/11/20 0948  TempSrc:   PainSc: 5                  Barnet Glasgow

## 2020-06-11 NOTE — Discharge Instructions (Signed)
DISCHARGE INSTRUCTIONS URETERAL STENTS  MEDICATIONS:  1. Resume all your other meds from home  2. Oxybutynin is to help with bladder cramping/pressure 3. Hydrocodone-acetaminophen (patient has at home) may be taken for severe pain 4. Acetaminophen may be taken for mild-moderate pain  ACTIVITY:  1. No strenuous activity x 1week  2. No driving while on narcotic pain medications  3. Drink plenty of water  4. Continue to walk at home - you can still get blood clots when you are at home, so keep active, but don't over do it.  5. May return to work/school tomorrow or when you feel ready   BATHING:  1. You can shower and we recommend daily showers    SIGNS/SYMPTOMS TO CALL:  Please call us if you have a fever greater than 101.5, uncontrolled nausea/vomiting, uncontrolled pain, dizziness, unable to urinate, bloody urine, chest pain, shortness of breath, leg swelling, leg pain, redness around wound, drainage from wound, or any other concerns or questions.   You can reach [place] at [phone].   FOLLOW-UP:  1. You will be contacted for a follow up with Dr. Claudia Desanctis in 2 weeks.  You will be scheduled for a renal ultrasound prior.

## 2020-06-11 NOTE — Interval H&P Note (Signed)
History and Physical Interval Note:  06/11/2020 7:14 AM  Mary Stark  has presented today for surgery, with the diagnosis of BILATERAL HYDRONEPHROSIS.  The various methods of treatment have been discussed with the patient and family. After consideration of risks, benefits and other options for treatment, the patient has consented to  Procedure(s) with comments: [name], URETEROSCOPY AND STENT PLACEMENT (Bilateral) - 17 MINS as a surgical intervention.  The patient's history has been reviewed, patient examined, no change in status, stable for surgery.  I have reviewed the patient's chart and labs.  Questions were answered to the patient's satisfaction.     [name] D Ridhima Golberg

## 2020-06-11 NOTE — Anesthesia Procedure Notes (Signed)
Procedure Name: LMA Insertion Date/Time: 06/11/2020 7:38 AM Performed by: Lavina Hamman, CRNA Pre-anesthesia Checklist: Patient identified, Emergency Drugs available, Suction available and Patient being monitored Patient Re-evaluated:Patient Re-evaluated prior to induction Oxygen Delivery Method: Circle system utilized Preoxygenation: Pre-oxygenation with 100% oxygen Induction Type: IV induction LMA: LMA inserted LMA Size: 4.0 Number of attempts: 1 Placement Confirmation: positive ETCO2 and breath sounds checked- equal and bilateral Tube secured with: Tape Dental Injury: Teeth and Oropharynx as per pre-operative assessment  Comments: Inserted by Frutoso Chase

## 2020-06-11 NOTE — Transfer of Care (Signed)
Immediate Anesthesia Transfer of Care Note  Patient: Mary Stark  Procedure(s) Performed: Procedure(s) with comments: CYSTOSCOPY WITH RETROGRADE PYELOGRAM, URETEROSCOPY AND STENT PLACEMENT, RIGHT URETERAL BIOPSY (Bilateral) - 79 MINS  Patient Location: PACU  Anesthesia Type:General  Level of Consciousness:  sedated, patient cooperative and responds to stimulation  Airway & Oxygen Therapy:Patient Spontanous Breathing and Patient connected to face mask oxgen  Post-op Assessment:  Report given to PACU RN and Post -op Vital signs reviewed and stable  Post vital signs:  Reviewed and stable  Last Vitals:  Vitals:   06/11/20 0543 06/11/20 0825  BP: 133/85 114/78  Pulse: 62 (!) 57  Resp: 17 10  Temp: 36.7 C 36.5 C  SpO2: 86% 148%    Complications: No apparent anesthesia complications

## 2020-06-11 NOTE — Op Note (Signed)
Preoperative diagnosis: Bilateral hydronephrosis  Postoperative diagnosis: Bilateral hydronephrosis  Procedure:  1. Cystoscopy 2. Bilateral diagnostic ureteroscopy 3. Right ureteral brush biopsy and right ureteral biopsy 4. bilateral 36F x 26 ureteral stent placement  5. bilateral retrograde pyelography with interpretation   Surgeon: Jacalyn Lefevre, MD  Findings: 1.  Normal urethra 2.  Normal bladder mucosa without bladder masses or stones 3.  Right retrograde pyelogram with 2 cm narrowing of the proximal ureter and severe hydronephrosis of the right collecting system 4.  Left retrograde pyelogram with mild hydronephrosis to the UVJ 5.  Right diagnostic ureteroscopy revealed pale edematous ureteral mucosa at level of narrowing consistent with inflammation, due to obstruction could not pass scope proximal to this area brush biopsy and forcep biopsy were taken  Anesthesia: General  Complications: None  Intraoperative findings: right retrograde pyelography demonstrated a proximal ureteral narrowing approximately 2 cm in length with moderate to severe hydronephrosis; left retrograde pyelogram demonstrated mild to moderate hydronephrosis to the level of the UVJ.   EBL: Minimal  Specimens: 1. Right ureteral brush biopsy and right ureteral biopsy   Indication: Mary Stark is a 64 y.o.   patient who initially presented with incidental finding of right hydronephrosis that developed right flank pain with moderate right hydronephrosis as well as left mild hydronephrosis.  After reviewing the management options for treatment, the patient elected to proceed with the above surgical procedure(s). We have discussed the potential benefits and risks of the procedure, side effects of the proposed treatment, the likelihood of the patient achieving the goals of the procedure, and any potential problems that might occur during the procedure or recuperation. Informed consent has been  obtained.   Description of procedure:  The patient was taken to the operating room and general anesthesia was induced.  The patient was placed in the dorsal lithotomy position, prepped and draped in the usual sterile fashion, and preoperative antibiotics were administered. A preoperative time-out was performed.   Cystourethroscopy was performed.  The patient's urethra was examined and was normal. The bladder was then systematically examined in its entirety. There was no evidence for any bladder tumors, stones, or other mucosal pathology.  Attention then turned to the right ureteral orifice and a ureteral catheter was used to intubate the ureteral orifice.  Omnipaque contrast was injected through the ureteral catheter and a retrograde pyelogram was performed with findings as dictated above.  A 0.38 sensor guidewire was then advanced up the right ureter into the renal pelvis under fluoroscopic guidance. The 4.5 Fr semirigid ureteroscope was then advanced into the ureter next to the guidewire to the level of the proximal ureter where the narrowed area was seen on pyelography.  The scope could not traverse this area due to obstruction.  The 4.5 French semirigid ureteroscope was removed and the 6 French ureteroscope was then advanced so that a biopsy could be obtained.  A brush biopsy was then taken as well as a biopsy with ureteral forceps.  It appeared edematous more consistent with inflammation then tumor.  The ureteroscope was removed.  The wire was then backloaded through the cystoscope and a ureteral stent was advance over the wire using Seldinger technique.  The stent was positioned appropriately under fluoroscopic and cystoscopic guidance.  The wire was then removed with an adequate stent curl noted in the renal pelvis as well as in the bladder.  Attention then turned to the left side where a retrograde pyelogram was performed in the similar manner.  Diagnostic ureteroscopy then  took place to the  level of the UPJ where the inflammation was not as severe or obstructing.  The mucosa appeared slightly edematous and pale.  There was no obstruction or area to biopsy.  The ureteroscope was removed and the stent was placed in standard fashion over a wire with the cystoscope.  The bladder was then emptied and the procedure ended.  The patient appeared to tolerate the procedure well and without complications.  The patient was able to be awakened and transferred to the recovery unit in satisfactory condition.   Disposition: The strings were removed from the stents prior to placement.  She will be seen in the office approximately 2 weeks with a renal ultrasound as well as repeat lab work.

## 2020-06-12 ENCOUNTER — Encounter (HOSPITAL_COMMUNITY): Payer: Self-pay | Admitting: Urology

## 2020-06-12 LAB — CYTOLOGY - NON PAP

## 2020-06-12 LAB — SURGICAL PATHOLOGY

## 2020-06-13 ENCOUNTER — Other Ambulatory Visit: Payer: Self-pay | Admitting: Osteopathic Medicine

## 2020-06-13 DIAGNOSIS — I10 Essential (primary) hypertension: Secondary | ICD-10-CM

## 2020-06-13 MED FILL — GABAPENTIN 300 MG CAPSULE: 300 | 30 days supply | Qty: 180 | Fill #1

## 2020-06-13 MED FILL — BUPROPION HCL ER (XL) 300 M: 300 | 30 days supply | Qty: 30 | Fill #1

## 2020-06-13 MED FILL — HYDROCHLOROTHIAZIDE 25 MG T: 25 | 90 days supply | Qty: 90 | Fill #0

## 2020-06-13 MED FILL — clonazePAM 0.5 MG TABS: 0.5 | 60 days supply | Qty: 60 | Fill #1

## 2020-06-13 MED FILL — AMLODIPINE BESYLATE 5 MG TA: 5 | 90 days supply | Qty: 90 | Fill #0

## 2020-06-14 ENCOUNTER — Other Ambulatory Visit: Payer: Self-pay | Admitting: Urology

## 2020-06-14 MED FILL — HYDROCODON-APAP 5-325: 5-325 | 5 days supply | Qty: 20 | Fill #0

## 2020-06-18 ENCOUNTER — Encounter: Payer: Self-pay | Admitting: Sports Medicine

## 2020-06-18 ENCOUNTER — Other Ambulatory Visit: Payer: Self-pay

## 2020-06-18 ENCOUNTER — Ambulatory Visit (INDEPENDENT_AMBULATORY_CARE_PROVIDER_SITE_OTHER): Payer: No Typology Code available for payment source | Admitting: Sports Medicine

## 2020-06-18 DIAGNOSIS — M1712 Unilateral primary osteoarthritis, left knee: Secondary | ICD-10-CM | POA: Diagnosis not present

## 2020-06-18 NOTE — Assessment & Plan Note (Signed)
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 She has knee osteoarthritis, at the last visit we did an aspiration and injection and she returns today pain-free, she is wearing her brace, and happy with how things are going. She is aware of future options including PRP, genicular radiofrequency ablation, she did recently complete a course of viscosupplementation with Dr. Mardelle Matte without much improvement. 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 From the standpoint of her knee she can return to see me as needed.

## 2020-06-18 NOTE — Progress Notes (Signed)
    Procedures performed today:    None.  Independent interpretation of notes and tests performed by another provider:   None.  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 She has knee osteoarthritis, at the last visit we did an aspiration and injection and she returns today pain-free, she is wearing her brace, and happy with how things are going. She is aware of future options including PRP, genicular radiofrequency ablation, she did recently complete a course of viscosupplementation with Dr. Mardelle Matte without much improvement. 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 From the standpoint of her knee she can return to see me as needed.    ___________________________________________ Gwen Her. Dianah Field, M.D., ABFM., CAQSM. Primary Care and [place] Instructor of [place] [facility]

## 2020-06-18 NOTE — Telephone Encounter (Signed)
Patient would like an extension on leave of absence, to cover biopsy from stent removal and replacement. See My chart message. Thanks

## 2020-06-25 ENCOUNTER — Ambulatory Visit (INDEPENDENT_AMBULATORY_CARE_PROVIDER_SITE_OTHER): Payer: No Typology Code available for payment source | Admitting: Medical-Surgical

## 2020-06-25 ENCOUNTER — Encounter: Payer: Self-pay | Admitting: Medical-Surgical

## 2020-06-25 VITALS — BP 120/76 | HR 57 | Wt 198.0 lb

## 2020-06-25 DIAGNOSIS — R5383 Other fatigue: Secondary | ICD-10-CM

## 2020-06-25 NOTE — Progress Notes (Signed)
Subjective:    CC: Fatigue  HPI: Pleasant 64 year old female presenting today with reports of severe fatigue for the past several weeks.  Notes that her hemoglobin has been low in recent checks and would like to have this rechecked today.  Also notes that she was recently diagnosed with potential urological cancer.  Has a plan to follow-up with urology for a repeat cystoscopy and stent placement on 07/05/2020.  History of depression and anxiety currently being treated with Abilify 5 mg, Wellbutrin 300 mg, and Cymbalta 30 mg daily.  Notes these medications did help her symptoms and kept her well controlled until her recent struggles and diagnosis.  She does have Klonopin that she takes as needed.  Notes that she took Klonopin on both Friday and again yesterday but wonders if this may be contributing to her feelings of fatigue.  Has a significant lack of energy and lack of interest in doing regular activities.  Denies SI/HI.  Endorses an episode several days ago of right chest pain that radiated into the neck/jaw.  This lasted approximately 5 minutes.  It occurred while she was standing at the sink feeling vegetables but she laid down and the pain went away spontaneously.  She has had no further issues with this type of pain and has had none in the past.  Wonders if she may have had a large gas bubble or if this may have been anxiety related.  Denies other episodes of chest pain, shortness of breath, palpitations, lower extremity edema, dizziness, and headaches.  I reviewed the past medical history, family history, social history, surgical history, and allergies today and no changes were needed.  Please see the problem list section below in epic for further details.  Past Medical History: Past Medical History:  Diagnosis Date  . Alcohol dependence ([place])   . Arthritis    Back and lt knee  . Cancer ([place])    skin on nose  . Depression 07/17/2013  . Dyspnea    seasonal allergies  . History of  hepatitis C 07/17/2013  . History of rheumatic fever   . Hyperlipidemia 07/17/2013  . Hypertension   . Tobacco use   . Toe fracture, right 10/07/2016  . Vitamin D deficiency 07/17/2013   Past Surgical History: Past Surgical History:  Procedure Laterality Date  . ANAL RECTAL MANOMETRY N/A 01/31/2020   Procedure: ANO RECTAL MANOMETRY;  Surgeon: Arta Silence, MD;  Location: [facility];  Service: Endoscopy;  Laterality: N/A;  . [place]  . CYSTOSCOPY WITH RETROGRADE PYELOGRAM, URETEROSCOPY AND STENT PLACEMENT Bilateral 06/11/2020   Procedure: CYSTOSCOPY WITH RETROGRADE PYELOGRAM, URETEROSCOPY AND STENT PLACEMENT, RIGHT URETERAL BIOPSY;  Surgeon: Robley Fries, MD;  Location: [facility];  Service: Urology;  Laterality: Bilateral;  90 MINS  . FINGER SURGERY Right    5th  . GYNECOLOGIC CRYOSURGERY    . TONSILLECTOMY     Social History: Social History   Socioeconomic History  . Marital status: Married    Spouse name: Not on file  . Number of children: Not on file  . Years of education: Not on file  . Highest education level: Not on file  Occupational History  . Not on file  Tobacco Use  . Smoking status: Former Smoker    Packs/day: 1.00    Years: 25.00    Pack years: 25.00    Types: Cigarettes    Quit date: 2012    Years since quitting: 9.5  . Smokeless tobacco: Never Used  .  Tobacco comment: <1 cig per month  Vaping Use  . Vaping Use: Every day  . Substances: Nicotine, Flavoring  Substance and Sexual Activity  . Alcohol use: Not Currently    Alcohol/week: 24.0 - 36.0 standard drinks    Types: 24 - 36 Cans of beer per week    Comment: Pt reports that she stopped march 2021  . Drug use: No  . Sexual activity: Yes    Birth control/protection: None  Other Topics Concern  . Not on file  Social History Narrative  . Not on file   Social Determinants of Health   Financial Resource Strain:   . Difficulty of Paying Living Expenses:   Food Insecurity:   .  Worried About Charity fundraiser in the Last Year:   . Arboriculturist in the Last Year:   Transportation Needs:   . Film/video editor (Medical):   Marland Kitchen Lack of Transportation (Non-Medical):   Physical Activity:   . Days of Exercise per Week:   . Minutes of Exercise per Session:   Stress:   . Feeling of Stress :   Social Connections:   . Frequency of Communication with Friends and Family:   . Frequency of Social Gatherings with Friends and Family:   . Attends Religious Services:   . Active Member of Clubs or Organizations:   . Attends Archivist Meetings:   Marland Kitchen Marital Status:    Family History: Family History  Problem Relation Age of Onset  . Ovarian cancer Mother   . Cardiomyopathy Father   . Valvular heart disease Father   . Liver cancer Brother   . Protein C deficiency Brother   . Protein C deficiency Brother   . Stroke Brother    Allergies: Allergies  Allergen Reactions  . Metformin And Related Other (See Comments)    Lactic acidosis   . Celebrex [Celecoxib] Other (See Comments)    Dizziness, "makes me feel bad"  . Penicillins Hives and Rash    Has patient had a PCN reaction causing immediate rash, facial/tongue/throat swelling, SOB or lightheadedness with hypotension: Yes Has patient had a PCN reaction causing severe rash involving mucus membranes or skin necrosis:No Has patient had a PCN reaction that required hospitalization: Yes Has patient had a PCN reaction occurring within the last 10 years: No If all of the above answers are "NO", then may proceed with Cephalosporin use.    Medications: See med rec.  Review of Systems: No fevers, chills, night sweats, weight loss, chest pain, or shortness of breath.   Objective:    General: Well Developed, well nourished, and in no acute distress.  Neuro: Alert and oriented x3.  HEENT: Normocephalic, atraumatic.  Skin: Warm and dry. Cardiac: Regular rate and rhythm, no murmurs rubs or gallops, no lower  extremity edema.  Respiratory: Clear to auscultation bilaterally. Not using accessory muscles, speaking in full sentences.  Impression and Recommendations:    1. Fatigue, unspecified type Last lab work showed mild anemia as well as hyponatremia.  Rechecking CBC and CMP to evaluate for causes.  TSH checked fairly recently with normal findings.  Suspect exacerbation of depression/anxiety may be part of her fatigue in conjunction with lab abnormalities.  Pending lab results for further work-up. - CBC - COMPLETE METABOLIC PANEL WITH GFR  Return if symptoms worsen or fail to improve. ___________________________________________ [name], DNP, APRN, FNP-BC Primary Care and [place]

## 2020-06-26 ENCOUNTER — Other Ambulatory Visit: Payer: Self-pay

## 2020-06-26 ENCOUNTER — Encounter (HOSPITAL_BASED_OUTPATIENT_CLINIC_OR_DEPARTMENT_OTHER): Payer: Self-pay | Admitting: Urology

## 2020-06-26 LAB — COMPLETE METABOLIC PANEL WITH GFR
AG Ratio: 1.3 (calc) (ref 1.0–2.5)
ALT: 11 U/L (ref 6–29)
AST: 14 U/L (ref 10–35)
Albumin: 4.3 g/dL (ref 3.6–5.1)
Alkaline phosphatase (APISO): 71 U/L (ref 37–153)
BUN/Creatinine Ratio: 16 (calc) (ref 6–22)
BUN: 20 mg/dL (ref 7–25)
CO2: 30 mmol/L (ref 20–32)
Calcium: 9.6 mg/dL (ref 8.6–10.4)
Chloride: 97 mmol/L — ABNORMAL LOW (ref 98–110)
Creat: 1.25 mg/dL — ABNORMAL HIGH (ref 0.50–0.99)
GFR, Est African American: 53 mL/min/{1.73_m2} — ABNORMAL LOW (ref >=60)
GFR, Est Non African American: 46 mL/min/{1.73_m2} — ABNORMAL LOW (ref >=60)
Globulin: 3.2 g/dL (calc) (ref 1.9–3.7)
Glucose, Bld: 108 mg/dL — ABNORMAL HIGH (ref 65–99)
Potassium: 5 mmol/L (ref 3.5–5.3)
Sodium: 136 mmol/L (ref 135–146)
Total Bilirubin: 0.4 mg/dL (ref 0.2–1.2)
Total Protein: 7.5 g/dL (ref 6.1–8.1)

## 2020-06-26 LAB — CBC
HCT: 35 % (ref 35.0–45.0)
Hemoglobin: 11.7 g/dL (ref 11.7–15.5)
MCH: 31.5 pg (ref 27.0–33.0)
MCHC: 33.4 g/dL (ref 32.0–36.0)
MCV: 94.1 fL (ref 80.0–100.0)
MPV: 10.3 fL (ref 7.5–12.5)
Platelets: 349 10*3/uL (ref 140–400)
RBC: 3.72 10*6/uL — ABNORMAL LOW (ref 3.80–5.10)
RDW: 11.8 % (ref 11.0–15.0)
WBC: 9.4 10*3/uL (ref 3.8–10.8)

## 2020-06-26 NOTE — Progress Notes (Signed)
NEW Covid Policy July 1537  Surgery Day:  07-05-20  Facility:  [facility]  Type of Surgery: CYSTOSCOPY  Have you had Covid vaccine?YES  Fully Covid Vaccinated:   1) 12-11-2019                                          2)01-01-2020                                          Where? [place]                                           Type?PFIZER  In the past 14 days: NO        Have you had any symptoms? NO       Have you been tested covid positive? NO       Have you been in contact with someone covid positive? NO       Have you traveled internationally?NO        Is pt Immuno-compromised? NO  PATIENT INSTRUCTED TO BRING COVID VACCINATION CARD DAY OF SURGERY

## 2020-06-26 NOTE — Progress Notes (Addendum)
Spoke w/ via phone for pre-op interview---PT Lab needs dos----  NONE             Lab results------EKG 06-07-2020 Epic, CBC, CMET 06-25-2020 EPIC COVID test ------NOT NEEDED Pt aware to bring vaccine card dos for verification. Arrive at -------800 AM 07-05-2020 NPO after MN NO Solid Food.  Clear liquids from MN until---700 AM Medications to take morning of surgery -----PROAIR INHALER PRN/BRING INHALER, BUPROPION, GABAPENTIN Diabetic medication -----N/A Patient Special Instructions ---NONE Pre-Op special Istructions -----NONE Patient verbalized understanding of instructions that were given at this phone interview. Patient denies shortness of breath, chest pain, fever, cough a this phone interview.  PATIENT HAS TRAGUS AND LEFT NOSTRIL PIERCING CANNOT REMOVE WAS TAPED DOWN FOR 06-11-2020 SURGERY [facility] OR

## 2020-07-02 ENCOUNTER — Other Ambulatory Visit: Payer: Self-pay | Admitting: Osteopathic Medicine

## 2020-07-02 ENCOUNTER — Other Ambulatory Visit (HOSPITAL_COMMUNITY)
Admission: RE | Admit: 2020-07-02 | Discharge: 2020-07-02 | Disposition: A | Discharge status: Home / Self Care | Payer: No Typology Code available for payment source | Source: Ambulatory Visit | Attending: Urology | Admitting: Urology

## 2020-07-02 DIAGNOSIS — Z01812 Encounter for preprocedural laboratory examination: Secondary | ICD-10-CM | POA: Insufficient documentation

## 2020-07-02 DIAGNOSIS — Z20822 Contact with and (suspected) exposure to covid-19: Secondary | ICD-10-CM | POA: Diagnosis not present

## 2020-07-02 DIAGNOSIS — I10 Essential (primary) hypertension: Secondary | ICD-10-CM

## 2020-07-02 LAB — SARS CORONAVIRUS 2 (TAT 6-24 HRS): SARS Coronavirus 2: NEGATIVE

## 2020-07-02 MED FILL — ARIPiprazole 5 MG TABS: 5 | 30 days supply | Qty: 30 | Fill #3

## 2020-07-02 MED FILL — DULoxetine HCL 30 MG CPEP: 30 | 30 days supply | Qty: 30 | Fill #1

## 2020-07-03 MED FILL — BYSTOLIC 20 MG TABLET: 20 | 90 days supply | Qty: 90 | Fill #0

## 2020-07-05 ENCOUNTER — Encounter (HOSPITAL_BASED_OUTPATIENT_CLINIC_OR_DEPARTMENT_OTHER)
Admission: RE | Disposition: A | Discharge status: Home / Self Care | Payer: Self-pay | Source: Ambulatory Visit | Attending: Urology

## 2020-07-05 ENCOUNTER — Other Ambulatory Visit: Payer: Self-pay

## 2020-07-05 ENCOUNTER — Ambulatory Visit (HOSPITAL_BASED_OUTPATIENT_CLINIC_OR_DEPARTMENT_OTHER): Payer: No Typology Code available for payment source | Admitting: Anesthesiology

## 2020-07-05 ENCOUNTER — Encounter (HOSPITAL_BASED_OUTPATIENT_CLINIC_OR_DEPARTMENT_OTHER): Payer: Self-pay | Admitting: Urology

## 2020-07-05 ENCOUNTER — Ambulatory Visit (HOSPITAL_BASED_OUTPATIENT_CLINIC_OR_DEPARTMENT_OTHER)
Admission: RE | Admit: 2020-07-05 | Discharge: 2020-07-05 | Disposition: A | Discharge status: Home / Self Care | Payer: No Typology Code available for payment source | Source: Ambulatory Visit | Attending: Urology | Admitting: Urology

## 2020-07-05 DIAGNOSIS — I10 Essential (primary) hypertension: Secondary | ICD-10-CM | POA: Insufficient documentation

## 2020-07-05 DIAGNOSIS — Z79899 Other long term (current) drug therapy: Secondary | ICD-10-CM | POA: Insufficient documentation

## 2020-07-05 DIAGNOSIS — Z87891 Personal history of nicotine dependence: Secondary | ICD-10-CM | POA: Diagnosis not present

## 2020-07-05 DIAGNOSIS — F329 Major depressive disorder, single episode, unspecified: Secondary | ICD-10-CM | POA: Insufficient documentation

## 2020-07-05 DIAGNOSIS — F419 Anxiety disorder, unspecified: Secondary | ICD-10-CM | POA: Insufficient documentation

## 2020-07-05 DIAGNOSIS — N1330 Unspecified hydronephrosis: Secondary | ICD-10-CM | POA: Diagnosis not present

## 2020-07-05 DIAGNOSIS — Z7982 Long term (current) use of aspirin: Secondary | ICD-10-CM | POA: Insufficient documentation

## 2020-07-05 DIAGNOSIS — R109 Unspecified abdominal pain: Secondary | ICD-10-CM | POA: Diagnosis not present

## 2020-07-05 HISTORY — DX: Gastro-esophageal reflux disease without esophagitis: K21.9

## 2020-07-05 HISTORY — DX: Anxiety disorder, unspecified: F41.9

## 2020-07-05 HISTORY — DX: Personal history of urinary calculi: Z87.442

## 2020-07-05 HISTORY — PX: CYSTOSCOPY WITH RETROGRADE PYELOGRAM, URETEROSCOPY AND STENT PLACEMENT: SHX5789

## 2020-07-05 HISTORY — DX: Other seasonal allergic rhinitis: J30.2

## 2020-07-05 SURGERY — CYSTOURETEROSCOPY, WITH RETROGRADE PYELOGRAM AND STENT INSERTION
Anesthesia: General | Site: Renal | Laterality: Bilateral

## 2020-07-05 MED ORDER — MIDAZOLAM HCL 5 MG/5ML IJ SOLN
INTRAMUSCULAR | Status: DC | PRN
  Administered 2020-07-05: 2 mg via INTRAVENOUS

## 2020-07-05 MED ORDER — FENTANYL CITRATE (PF) 100 MCG/2ML IJ SOLN
INTRAMUSCULAR | Status: DC | PRN
  Administered 2020-07-05 (×3): 25 ug via INTRAVENOUS

## 2020-07-05 MED ORDER — PROPOFOL 10 MG/ML IV BOLUS
INTRAVENOUS | Status: DC | PRN
  Administered 2020-07-05: 150 mg via INTRAVENOUS

## 2020-07-05 MED ORDER — DEXAMETHASONE SODIUM PHOSPHATE 10 MG/ML IJ SOLN
INTRAMUSCULAR | Status: DC | PRN
  Administered 2020-07-05: 10 mg via INTRAVENOUS

## 2020-07-05 MED ORDER — ACETAMINOPHEN 160 MG/5ML PO SOLN: 325.0000 mg | ORAL | Status: DC | PRN

## 2020-07-05 MED ORDER — MIDAZOLAM HCL 2 MG/2ML IJ SOLN
INTRAMUSCULAR | Status: AC
  Filled 2020-07-05: qty 2

## 2020-07-05 MED ORDER — CIPROFLOXACIN IN D5W 400 MG/200ML IV SOLN
400.0000 mg | INTRAVENOUS | Status: AC
  Administered 2020-07-05: 400 mg via INTRAVENOUS

## 2020-07-05 MED ORDER — CIPROFLOXACIN IN D5W 400 MG/200ML IV SOLN
INTRAVENOUS | Status: AC
  Filled 2020-07-05: qty 200

## 2020-07-05 MED ORDER — IOHEXOL 300 MG/ML  SOLN
INTRAMUSCULAR | Status: DC | PRN
  Administered 2020-07-05: 35 mL

## 2020-07-05 MED ORDER — FENTANYL CITRATE (PF) 100 MCG/2ML IJ SOLN
INTRAMUSCULAR | Status: AC
  Filled 2020-07-05: qty 2

## 2020-07-05 MED ORDER — LIDOCAINE 2% (20 MG/ML) 5 ML SYRINGE
INTRAMUSCULAR | Status: DC | PRN
  Administered 2020-07-05: 100 mg via INTRAVENOUS

## 2020-07-05 MED ORDER — ONDANSETRON HCL 4 MG/2ML IJ SOLN
INTRAMUSCULAR | Status: AC
  Filled 2020-07-05: qty 2

## 2020-07-05 MED ORDER — EPHEDRINE SULFATE-NACL 50-0.9 MG/10ML-% IV SOSY
PREFILLED_SYRINGE | INTRAVENOUS | Status: DC | PRN
  Administered 2020-07-05 (×2): 5 mg via INTRAVENOUS

## 2020-07-05 MED ORDER — ONDANSETRON HCL 4 MG/2ML IJ SOLN: 4.0000 mg | Freq: Once | INTRAMUSCULAR | Status: DC | PRN

## 2020-07-05 MED ORDER — OXYCODONE HCL 5 MG PO TABS
5.0000 mg | ORAL_TABLET | Freq: Once | ORAL | Status: AC | PRN
  Administered 2020-07-05: 5 mg via ORAL

## 2020-07-05 MED ORDER — PROPOFOL 10 MG/ML IV BOLUS
INTRAVENOUS | Status: AC
  Filled 2020-07-05: qty 20

## 2020-07-05 MED ORDER — ONDANSETRON HCL 4 MG/2ML IJ SOLN
INTRAMUSCULAR | Status: DC | PRN
  Administered 2020-07-05: 4 mg via INTRAVENOUS

## 2020-07-05 MED ORDER — OXYCODONE HCL 5 MG PO TABS
ORAL_TABLET | ORAL | Status: AC
  Filled 2020-07-05: qty 1

## 2020-07-05 MED ORDER — LACTATED RINGERS IV SOLN: INTRAVENOUS | Status: DC

## 2020-07-05 MED ORDER — LIDOCAINE 2% (20 MG/ML) 5 ML SYRINGE
INTRAMUSCULAR | Status: AC
  Filled 2020-07-05: qty 5

## 2020-07-05 MED ORDER — FENTANYL CITRATE (PF) 100 MCG/2ML IJ SOLN: 25.0000 ug | INTRAMUSCULAR | Status: DC | PRN

## 2020-07-05 MED ORDER — SODIUM CHLORIDE 0.9 % IR SOLN
Status: DC | PRN
  Administered 2020-07-05: 3000 mL

## 2020-07-05 MED ORDER — ACETAMINOPHEN 325 MG PO TABS: 325.0000 mg | ORAL_TABLET | ORAL | Status: DC | PRN

## 2020-07-05 MED ORDER — EPHEDRINE 5 MG/ML INJ
INTRAVENOUS | Status: AC
  Filled 2020-07-05: qty 10

## 2020-07-05 MED ORDER — DEXAMETHASONE SODIUM PHOSPHATE 10 MG/ML IJ SOLN
INTRAMUSCULAR | Status: AC
  Filled 2020-07-05: qty 1

## 2020-07-05 MED ORDER — HYDROCODONE-ACETAMINOPHEN 5-325 MG PO TABS: 1.0000 | ORAL_TABLET | ORAL | 0 refills | Status: DC | PRN

## 2020-07-05 MED ORDER — MEPERIDINE HCL 25 MG/ML IJ SOLN: 6.2500 mg | INTRAMUSCULAR | Status: DC | PRN

## 2020-07-05 MED ORDER — OXYCODONE HCL 5 MG/5ML PO SOLN: 5.0000 mg | Freq: Once | ORAL | Status: AC | PRN

## 2020-07-05 MED FILL — HYDROCODON-APAP 5-325: 5-325 | 5 days supply | Qty: 20 | Fill #0

## 2020-07-05 SURGICAL SUPPLY — 22 items
BAG DRAIN URO-CYSTO SKYTR STRL (DRAIN) ×2 IMPLANT
BASKET ZERO TIP NITINOL 2.4FR (BASKET) IMPLANT
BRUSH URET BIOPSY 3F (UROLOGICAL SUPPLIES) ×8 IMPLANT
CATH URET 5FR 28IN OPEN ENDED (CATHETERS) ×4 IMPLANT
CLOTH BEACON ORANGE TIMEOUT ST (SAFETY) ×2 IMPLANT
DRSG TEGADERM 4X4.75 (GAUZE/BANDAGES/DRESSINGS) IMPLANT
EXTRACTOR STONE 1.7FRX115CM (UROLOGICAL SUPPLIES) IMPLANT
FIBER LASER TRAC TIP (UROLOGICAL SUPPLIES) IMPLANT
FORCEPS BIOP 2.4F 115CM BACKLD (INSTRUMENTS) ×2 IMPLANT
GLOVE BIO SURGEON STRL SZ 6.5 (GLOVE) ×2 IMPLANT
GLOVE BIOGEL PI IND STRL 7.5 (GLOVE) ×2 IMPLANT
GLOVE BIOGEL PI INDICATOR 7.5 (GLOVE) ×2
GOWN STRL REUS W/TWL LRG LVL3 (GOWN DISPOSABLE) ×4 IMPLANT
GUIDEWIRE STR DUAL SENSOR (WIRE) ×2 IMPLANT
IV NS IRRIG 3000ML ARTHROMATIC (IV SOLUTION) ×2 IMPLANT
KIT TURNOVER CYSTO (KITS) ×2 IMPLANT
MANIFOLD NEPTUNE II (INSTRUMENTS) ×2 IMPLANT
PACK CYSTO (CUSTOM PROCEDURE TRAY) ×2 IMPLANT
SHEATH URETERAL 12FRX28CM (UROLOGICAL SUPPLIES) ×2 IMPLANT
STENT URET 6FRX26 CONTOUR (STENTS) ×4 IMPLANT
TUBE CONNECTING 12X1/4 (SUCTIONS) ×2 IMPLANT
TUBING UROLOGY SET (TUBING) ×2 IMPLANT

## 2020-07-05 NOTE — Transfer of Care (Signed)
Immediate Anesthesia Transfer of Care Note  Patient: Mary Stark  Procedure(s) Performed: CYSTOSCOPY WITH RETROGRADE PYELOGRAM, URETEROSCOPY,  BIOPSIES AND STENT EXCHANGES (Bilateral Renal)  Patient Location: PACU  Anesthesia Type:General  Level of Consciousness: drowsy  Airway & Oxygen Therapy: Patient Spontanous Breathing and Patient connected to face mask oxygen  Post-op Assessment: Report given to RN and Post -op Vital signs reviewed and stable  Post vital signs: Reviewed and stable  Last Vitals:  Vitals Value Taken Time  BP 103/76 07/05/20 1105  Temp    Pulse 63 07/05/20 1108  Resp 16 07/05/20 1108  SpO2 94 % 07/05/20 1108  Vitals shown include unvalidated device data.  Last Pain:  Vitals:   07/05/20 0804  TempSrc: Oral  PainSc: 3       Patients Stated Pain Goal: 4 (33/83/29 1916)  Complications: No complications documented.

## 2020-07-05 NOTE — Discharge Instructions (Signed)
DISCHARGE INSTRUCTIONS FOR URETERAL STENT   MEDICATIONS:  1.  Resume all your other meds from home  2.  You can take AZO over the counter to help with burning with urination 3. Tylenol will help treat your pain.   Hydrocodone-acetaminophen can be used for severe pain.    ACTIVITY:  1. No strenuous activity x 1week  2. No driving while on narcotic pain medications  3. Drink plenty of water  4. Continue to walk at home - you can still get blood clots when you are at home, so keep active, but don't over do it.  5. May return to work/school tomorrow or when you feel ready   BATHING:  1. You can shower and we recommend daily showers  2. You have a string coming from your urethra: The stent string is attached to your ureteral stent. Do not pull on this.   SIGNS/SYMPTOMS TO CALL:  Please call us if you have a fever greater than 101.5, uncontrolled nausea/vomiting, uncontrolled pain, dizziness, unable to urinate, bloody urine, chest pain, shortness of breath, leg swelling, leg pain, redness around wound, drainage from wound, or any other concerns or questions.   You can reach [place] at [phone].   FOLLOW-UP:  1. You have an appointment in 1 week to discuss results.    Post Anesthesia Home Care Instructions  Activity: Get plenty of rest for the remainder of the day. A responsible individual must stay with you for 24 hours following the procedure.  For the next 24 hours, DO NOT: -Drive a car -Paediatric nurse -Drink alcoholic beverages -Take any medication unless instructed by your physician -Make any legal decisions or sign important papers.  Meals: Start with liquid foods such as gelatin or soup. Progress to regular foods as tolerated. Avoid greasy, spicy, heavy foods. If nausea and/or vomiting occur, drink only clear liquids until the nausea and/or vomiting subsides. Call your physician if vomiting continues.  Special Instructions/Symptoms: Your throat may feel dry or sore from  the anesthesia or the breathing tube placed in your throat during surgery. If this causes discomfort, gargle with warm salt water. The discomfort should disappear within 24 hours.

## 2020-07-05 NOTE — Anesthesia Preprocedure Evaluation (Addendum)
Anesthesia Evaluation  Patient identified by MRN, date of birth, ID band Patient awake    Reviewed: Allergy & Precautions, NPO status , Patient's Chart, lab work & pertinent test results  Airway Mallampati: II  TM Distance: >3 FB Neck ROM: Full    Dental no notable dental hx. (+) Teeth Intact, Dental Advisory Given   Pulmonary neg pulmonary ROS, former smoker,    Pulmonary exam normal breath sounds clear to auscultation       Cardiovascular hypertension, Pt. on medications Normal cardiovascular exam Rhythm:Regular Rate:Normal  EKG 6/21 Sinus rhythm with 1st degree A-V block   Neuro/Psych PSYCHIATRIC DISORDERS Anxiety Depression  Neuromuscular disease    GI/Hepatic GERD  Medicated,(+)     substance abuse  alcohol use, Hepatitis -, C  Endo/Other    Renal/GU ARFRenal disease         Musculoskeletal  (+) Arthritis ,   Abdominal (+) + obese,   Peds  Hematology  (+) Blood dyscrasia, anemia ,     Anesthesia Other Findings   Reproductive/Obstetrics                            Anesthesia Physical  Anesthesia Plan  ASA: III  Anesthesia Plan: General   Post-op Pain Management:    Induction: Intravenous  PONV Risk Score and Plan: 3 and Treatment may vary due to age or medical condition, Ondansetron, Dexamethasone and Midazolam  Airway Management Planned: LMA  Additional Equipment: None  Intra-op Plan:   Post-operative Plan:   Informed Consent: I have reviewed the patients History and Physical, chart, labs and discussed the procedure including the risks, benefits and alternatives for the proposed anesthesia with the patient or authorized representative who has indicated his/her understanding and acceptance.     Interpreter used for [employer] Discussed with: CRNA and Anesthesiologist  Anesthesia Plan Comments:         Anesthesia Quick Evaluation

## 2020-07-05 NOTE — Anesthesia Postprocedure Evaluation (Signed)
Anesthesia Post Note  Patient: Mary Stark  Procedure(s) Performed: CYSTOSCOPY WITH RETROGRADE PYELOGRAM, URETEROSCOPY,  BIOPSIES AND STENT EXCHANGES (Bilateral Renal)     Patient location during evaluation: PACU Anesthesia Type: General Level of consciousness: awake and alert Pain management: pain level controlled Vital Signs Assessment: post-procedure vital signs reviewed and stable Respiratory status: spontaneous breathing, nonlabored ventilation, respiratory function stable and patient connected to nasal cannula oxygen Cardiovascular status: blood pressure returned to baseline and stable Postop Assessment: no apparent nausea or vomiting Anesthetic complications: no   No complications documented.  Last Vitals:  Vitals:   07/05/20 1104 07/05/20 1115  BP: 103/76 123/77  Pulse: 61 65  Resp: 20 (!) 8  Temp: 37.4 C   SpO2: 93% 97%    Last Pain:  Vitals:   07/05/20 1104  TempSrc:   PainSc: Asleep                 Kwanza Cancelliere

## 2020-07-05 NOTE — Op Note (Signed)
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 Cystoscopy 2. Bilateral retrograde pyelogram 3. Bilateral diagnostic ureteroscopy with ureteral washing, brush biopsy and ureteral biopsy 4. bilateral 6F x 26 ureteral exchange  Surgeon:  , MD  Anesthesia: General  Complications: None  Intraoperative findings:  1. Hydronephrosis of bilateral kidney (R>L), moderate 2. Bilateral narrowing of ureters without specific filling defect 3. Inflammation of mid/proximal ureters bilaterally 4. No distinct ureteral masses; Bilateral collecting systems appeared normal without masses or stones 5. Normal bladder without masses or stone  EBL: Minimal  Specimens: 1. Bilateral ureteral washings 2. Bilateral ureteral brush biopsy 3. Bilateral ureteral biopsy  Disposition of specimens: [facility] for stone analysis  Indication: Mary Stark is a 64 y.o.   patient who initially presented with right hydronephrosis that progressed to bilateral hydronephrosis and underwent diagnostic ureteroscopy with washing and biopsy.  Pathology returned as suspicious urothelial cells.  During the 1st surgery visualization of the right renal pelvis was not possible and bilateral stents were replaced.  The patient now returns for repeat bilateral diagnostic ureteroscopy with biopsy and washings.  After reviewing the management options for treatment, the patient elected to proceed with the above surgical procedure(s). We have discussed the potential benefits and risks of the procedure, side effects of the proposed treatment, the likelihood of the patient achieving the goals of the procedure, and any potential problems that might occur during the procedure or recuperation. Informed consent has been obtained.   Description of procedure:  The patient was taken to the operating room and general  anesthesia was induced.  The patient was placed in the dorsal lithotomy position, prepped and draped in the usual sterile fashion, and preoperative antibiotics were administered. A preoperative time-out was performed.   Cystourethroscopy was performed.  The patient's urethra was examined and was normal. The bladder was then systematically examined in its entirety. There was no evidence for any bladder tumors, stones, or other mucosal pathology.    Attention then turned to the right ureteral orifice and a grasper was used to grab the existing ureteral stent and bring it to the urethral meatus.  A sensor wire was then advanced through the stent up to the kidney with fluoroscopic guidance.  Next an open-ended ureteral catheter was placed over the wire and the wire was removed.  A ureteral washing was then obtained from the right side.  A retrograde pyelogram was then obtained which showed very narrow ureter with dilated renal pelvis.  The wire was then replaced and diagnostic ureteroscopy took place with the semirigid ureteroscope.  It revealed inflammation of the mid to proximal ureter with no distinct bladder masses.  There was inflammatory tissue and it was difficult to traverse the scope through the ureter.  A ureteral brush biopsy was then obtained.  A 2nd wire was then placed through the ureteroscope and the ureteroscope was removed.  A ureteral access sheath was then placed over the 2nd wire and advanced to the mid ureter until resistance was met.  The inner sheath and wire removed.  Flexible ureteroscopy then took place.  After the inflammatory tissue was traversed the renal pelvis and calyces appeared normal without stones or masses.  Ureteral biopsy was then taken through the flexible scope.  The ureteral access sheath was removed and unison with the ureteroscope taking care to examine the ureter on the way out.  Again only inflammatory tissue was seen with sloughing of pale   urothelium.  A 6 x 26 French  stent was then placed over the existing sensor wire with cystoscopic and fluoroscopic guidance in standard fashion.  Attention then turned to the left side where the entire procedure was repeated.The bladder was then emptied and the procedure ended.  The patient appeared to tolerate the procedure well and without complications.  The patient was able to be awakened and transferred to the recovery unit in satisfactory condition.   Disposition: The strings were removed from the stents and patient will have follow-up in 1 week to discuss pathology report.

## 2020-07-05 NOTE — Interval H&P Note (Signed)
History and Physical Interval Note: Patient had bilateral ureteroscopy with brush biopsy and stent placement previously.  Due to suspicious cells and concern for malignancy, patient will have repeat ureteroscopy with brush biopsy and stent exchange.  07/05/2020 8:07 AM  [name]  has presented today for surgery, with the diagnosis of URETERAL MASS, HYDRONEPHROSIS.  The various methods of treatment have been discussed with the patient and family. After consideration of risks, benefits and other options for treatment, the patient has consented to  Procedure(s) with comments: [place], URETEROSCOPY AND STENT PLACEMENT (Bilateral) - 20 MINS as a surgical intervention.  The patient's history has been reviewed, patient examined, no change in status, stable for surgery.  I have reviewed the patient's chart and labs.  Questions were answered to the patient's satisfaction.     Mary Stark D Dayne Dekay

## 2020-07-05 NOTE — Anesthesia Procedure Notes (Signed)
Procedure Name: LMA Insertion Date/Time: 07/05/2020 9:50 AM Performed by: Gwyndolyn Saxon, CRNA Pre-anesthesia Checklist: Patient identified, Emergency Drugs available, Suction available and Patient being monitored Patient Re-evaluated:Patient Re-evaluated prior to induction Oxygen Delivery Method: Circle system utilized Preoxygenation: Pre-oxygenation with 100% oxygen Induction Type: IV induction Ventilation: Mask ventilation without difficulty LMA: LMA inserted LMA Size: 4.0 Number of attempts: 1 Placement Confirmation: positive ETCO2 and breath sounds checked- equal and bilateral Tube secured with: Tape Dental Injury: Teeth and Oropharynx as per pre-operative assessment

## 2020-07-08 ENCOUNTER — Encounter (HOSPITAL_BASED_OUTPATIENT_CLINIC_OR_DEPARTMENT_OTHER): Payer: Self-pay | Admitting: Urology

## 2020-07-08 LAB — CYTOLOGY - NON PAP

## 2020-07-08 LAB — SURGICAL PATHOLOGY

## 2020-07-09 ENCOUNTER — Other Ambulatory Visit (HOSPITAL_COMMUNITY): Payer: Self-pay

## 2020-07-09 MED FILL — GABAPENTIN 300 MG CAPSULE: 300 | 30 days supply | Qty: 180 | Fill #0

## 2020-07-09 MED FILL — BUPROPION HCL ER (XL) 300 M: 300 | 30 days supply | Qty: 30 | Fill #0

## 2020-07-09 MED FILL — DULoxetine HCL 60 MG CPEP: 60 | 30 days supply | Qty: 30 | Fill #0

## 2020-07-12 MED FILL — TOLTERODINE TART ER 4 MG CA: 4 | 30 days supply | Qty: 30 | Fill #0

## 2020-07-19 MED FILL — predniSONE 50 MG TABS: 50 | 28 days supply | Qty: 21 | Fill #0

## 2020-07-19 MED FILL — predniSONE 10 MG TABS: 10 | 28 days supply | Qty: 21 | Fill #0

## 2020-07-24 ENCOUNTER — Telehealth: Payer: No Typology Code available for payment source | Admitting: Osteopathic Medicine

## 2020-07-24 ENCOUNTER — Telehealth: Payer: Self-pay | Admitting: Osteopathic Medicine

## 2020-07-24 NOTE — Telephone Encounter (Signed)
Patient contacted, was put on prednisone 50 mg  At the urologist for two weeks then to taper down patient reports after taking the medication 153/81 resting after movement 168/95 while she was putting on her make up. Wants to know when she should be concerned.

## 2020-07-24 NOTE — Telephone Encounter (Signed)
Patient wants to know when should she be concerned about the blood pressure. Can she add a regiment. States that her BP will be elevated because of the steroid medications.

## 2020-07-24 NOTE — Telephone Encounter (Signed)
Please call patient - what is her specific question about BP? I haven't seen her for awhile   BP Readings from Last 3 Encounters:  07/05/20 (!) 138/80  06/25/20 120/76  06/11/20 133/86   I'm not worried about the above BP's on file

## 2020-07-25 ENCOUNTER — Encounter: Payer: Self-pay | Admitting: Osteopathic Medicine

## 2020-07-25 NOTE — Telephone Encounter (Signed)
MyChart message sent directly to patient.

## 2020-07-31 ENCOUNTER — Ambulatory Visit (INDEPENDENT_AMBULATORY_CARE_PROVIDER_SITE_OTHER): Payer: No Typology Code available for payment source | Admitting: Osteopathic Medicine

## 2020-07-31 ENCOUNTER — Encounter: Payer: Self-pay | Admitting: Osteopathic Medicine

## 2020-07-31 ENCOUNTER — Other Ambulatory Visit: Payer: Self-pay | Admitting: Osteopathic Medicine

## 2020-07-31 VITALS — BP 161/94 | HR 69 | Temp 97.7°F | Wt 202.1 lb

## 2020-07-31 DIAGNOSIS — L989 Disorder of the skin and subcutaneous tissue, unspecified: Secondary | ICD-10-CM

## 2020-07-31 NOTE — Progress Notes (Signed)
Mary Stark is a 64 y.o. female who presents to  [place] at [facility]  today, 07/31/20, seeking care for the following:  . Concerning skin lesion x2 - fleshy colored lumps on R upper chest, one small and newer, one larger been there a couple months.      ASSESSMENT & PLAN with other pertinent findings:  The encounter diagnosis was Skin abnormality.     PRE-OP DIAGNOSIS: Abnormal Skin Lesion POST-OP DIAGNOSIS: Same  Performing Physician: [name]   PROCEDURE:  Shave Biopsy  The area surrounding the skin lesion was prepared and draped in the usual sterile manner. The lesion was anesthtized with lido/epi 2 cc. Lesions were removed in the usual manner with DermaBlade. Hemostasis was assured. The patient tolerated the procedure well. Lesion #1 0.5 cm diameter, lesion #2 about 0.75 cm diameter   Closure:      None   Followup: The patient tolerated the procedure well without complications.  Standard post-procedure care is explained and return precautions are given.      Follow-up instructions: Return for pending results .                                         BP (!) 161/94 (BP Location: Left Arm, Patient Position: Sitting, Cuff Size: Large)   Pulse 69   Temp 97.7 F (36.5 C) (Oral)   Wt 202 lb 1.9 oz (91.7 kg)   BMI 32.62 kg/m   Current Meds  Medication Sig  . acetaminophen (TYLENOL) 500 MG tablet Take 1,000 mg by mouth every 8 (eight) hours as needed for moderate pain.   Marland Kitchen amLODipine (NORVASC) 5 MG tablet TAKE 1 TABLET BY MOUTH ONCE DAILY (Patient taking differently: every evening. )  . ARIPiprazole (ABILIFY) 5 MG tablet Take 5 mg by mouth at bedtime.   Marland Kitchen ascorbic acid (VITAMIN C) 500 MG tablet Take 1,000 mg by mouth every evening.   . ASPIRIN LOW DOSE 81 MG EC tablet TAKE 1 TABLET BY MOUTH DAILY. (Patient taking differently: Take 81 mg by mouth daily. )  . buPROPion  (WELLBUTRIN XL) 300 MG 24 hr tablet Take 1 tablet (300 mg total) by mouth daily.  Marland Kitchen BYSTOLIC 20 MG TABS TAKE 1 TABLET (20 MG TOTAL) BY MOUTH DAILY.  Marland Kitchen Cholecalciferol (VITAMIN D) 125 MCG (5000 UT) CAPS Take 5,000 Units by mouth every evening.   . clonazePAM (KLONOPIN) 0.5 MG tablet Take 0.5 mg by mouth at bedtime.   . Coenzyme Q10 (CO Q-10) 200 MG CAPS Take 200 mg by mouth every evening.   . DULoxetine (CYMBALTA) 30 MG capsule Take 30 mg by mouth at bedtime.   . fexofenadine (ALLEGRA) 180 MG tablet Take 180 mg by mouth every evening.   . gabapentin (NEURONTIN) 300 MG capsule 1  qam   4   qhs (Patient taking differently: Take 300-1,200 mg by mouth See admin instructions. Take 300 mg by mouth in the morning and afternoon and 1200 mg at night)  . GLUCOSAMINE-CHONDROITIN PO Take 2 tablets by mouth every evening.   . hydrochlorothiazide (HYDRODIURIL) 25 MG tablet TAKE 1 TABLET (25 MG TOTAL) BY MOUTH DAILY.  Marland Kitchen HYDROcodone-acetaminophen (NORCO/VICODIN) 5-325 MG tablet Take 1 tablet by mouth every 4 (four) hours as needed for moderate pain (severe stent pain).  . Omega-3 Fatty Acids (FISH OIL) 1200 MG CAPS Take 1,200 mg  by mouth every evening.   Marland Kitchen omeprazole (PRILOSEC) 20 MG capsule Take 1 capsule (20 mg total) by mouth daily. (Patient taking differently: Take 20 mg by mouth every evening. )  . ondansetron (ZOFRAN-ODT) 4 MG disintegrating tablet Take 1 tablet (4 mg total) by mouth every 8 (eight) hours as needed for nausea or vomiting.  Marland Kitchen PROAIR HFA 108 (90 Base) MCG/ACT inhaler INHALE 1 TO 2 PUFFS BY MOUTH INTO THE LUNGS EVERY 4 HOURS AS NEEDED FOR WHEEZING OR SHORTNESS OF BREATH (Patient taking differently: Inhale 1-2 puffs into the lungs every 4 (four) hours as needed for wheezing or shortness of breath. )  . SAXENDA 18 MG/3ML SOPN Inject 3 mg into the skin daily.   . simvastatin (ZOCOR) 20 MG tablet TAKE 1 TABLET (20 MG TOTAL) BY MOUTH DAILY. (Patient taking differently: Take 20 mg by mouth at bedtime. )   . vitamin B-12 (CYANOCOBALAMIN) 1000 MCG tablet Take 2,000 mcg by mouth every evening.   . Vitamin E 180 MG (400 UNIT) CAPS Take 400 Units by mouth every evening.     No results found for this or any previous visit (from the past 72 hour(s)).  No results found.     All questions at time of visit were answered - patient instructed to contact office with any additional concerns or updates.  ER/RTC precautions were reviewed with the patient as applicable.   Please note: voice recognition software was used to produce this document, and typos may escape review. Please contact Dr. Sheppard Coil for any needed clarifications.

## 2020-08-07 ENCOUNTER — Other Ambulatory Visit (HOSPITAL_COMMUNITY): Payer: Self-pay | Admitting: Adult Health

## 2020-08-07 MED FILL — TAMSULOSIN HCL 0.4 MG CAP: 0.4 | 30 days supply | Qty: 30 | Fill #0

## 2020-08-07 MED FILL — FLUCONAZOLE 150 MG TABS: 150 | 1 days supply | Qty: 1 | Fill #0

## 2020-08-07 MED FILL — ARIPiprazole 5 MG TABS: 5 | 30 days supply | Qty: 30 | Fill #0

## 2020-08-13 DIAGNOSIS — Z8616 Personal history of COVID-19: Secondary | ICD-10-CM

## 2020-08-13 DIAGNOSIS — U071 COVID-19: Secondary | ICD-10-CM

## 2020-08-13 HISTORY — DX: COVID-19: U07.1

## 2020-08-13 HISTORY — DX: Personal history of COVID-19: Z86.16

## 2020-08-14 MED FILL — clonazePAM 0.5 MG TABS: 0.5 | 60 days supply | Qty: 60 | Fill #2

## 2020-08-14 MED FILL — buPROPion HCL ER (XL) 300 M: 300 | 30 days supply | Qty: 30 | Fill #1

## 2020-08-15 ENCOUNTER — Ambulatory Visit: Payer: No Typology Code available for payment source | Admitting: Medical-Surgical

## 2020-08-16 ENCOUNTER — Telehealth (INDEPENDENT_AMBULATORY_CARE_PROVIDER_SITE_OTHER): Payer: No Typology Code available for payment source | Admitting: Nurse Practitioner

## 2020-08-16 ENCOUNTER — Other Ambulatory Visit: Payer: Self-pay | Admitting: Nurse Practitioner

## 2020-08-16 ENCOUNTER — Encounter: Payer: Self-pay | Admitting: Nurse Practitioner

## 2020-08-16 ENCOUNTER — Telehealth: Payer: Self-pay | Admitting: Nurse Practitioner

## 2020-08-16 VITALS — BP 146/85 | HR 81 | Ht 66.0 in | Wt 202.0 lb

## 2020-08-16 DIAGNOSIS — U071 COVID-19: Secondary | ICD-10-CM | POA: Diagnosis not present

## 2020-08-16 DIAGNOSIS — Z6830 Body mass index (BMI) 30.0-30.9, adult: Secondary | ICD-10-CM

## 2020-08-16 DIAGNOSIS — I10 Essential (primary) hypertension: Secondary | ICD-10-CM

## 2020-08-16 NOTE — Progress Notes (Signed)
Orders for MAB Infusion placed. Appt 0830 08/17/2020.

## 2020-08-16 NOTE — Patient Instructions (Signed)
I have you scheduled for monoclonal antibody infusion at the infusion center located at [facility] at [place] tomorrow morning.  If you have a copy of your positive test results, please bring these with you to the infusion center.  Please follow the instructions below for where to go.  Park at [employer], enter into the 2nd entrance where the "wave, [employer]" is at the road.   Turn into this 2nd entrance and immediately turn left to park in 1 of the 5 parking spots.   STAY IN YOUR CAR, and call the phone number on the sign to inform staff of which parking space you're parked in. ([place] is the physical address to the [facility] but patients should NOT be entering inside the building, they are getting infused at [facility] on the [address], they should also not be entering [facility] hospital without a staff escort).    Patients being driven should also be taken to the parking lot at [place].  Have them call [phone] if they are not in a space with the sign and number.   If you have any questions or concerns please do not hesitate to reach out to me at the office or reach out to the infusion center directly.

## 2020-08-16 NOTE — Progress Notes (Signed)
Tested positive for Covid - Wednesday  Symptoms started Tuesday - woke up with fever 101.9 Having cough, congestion, diarrhea Feels "miserable"  Taking tylenol and robitussin - helping some   Can't find thermometer - will look and try to get reading for appt.

## 2020-08-16 NOTE — Progress Notes (Signed)
Virtual Video Visit via MyChart Note  I connected with  Mary Stark on 08/16/20 at  9:50 AM EDT by the video enabled telemedicine application for , MyChart, and verified that I am speaking with the correct person using two identifiers.   I introduced myself as a Designer, jewellery with the practice. We discussed the limitations of evaluation and management by telemedicine and the availability of in person appointments. The patient expressed understanding and agreed to proceed.  The patient is: At home I am: In the office  Subjective:    CC:  Chief Complaint  Patient presents with  . Covid Positive    HPI: Mary Stark is a 64 y.o. y/o female presenting via [place] today for positive Covid test results on Wednesday of this week.  She had symptom onset started on Tuesday of this week.  She endorses fever, cough, congestion, diarrhea, body aches and decreased appetite.  She denies nausea, vomiting, headache, rhinorrhea, shortness of breath, dizziness, chest pain, loss of taste or smell, sore throat, sinus pain or pressure.  She has been vaccinated against the COVID-19 vaccine.  Past medical history, Surgical history, Family history not pertinant except as noted below, Social history, Allergies, and medications have been entered into the medical record, reviewed, and corrections made.   Review of Systems:  See HPI for pertinent positive and negatives  Objective:    General: Speaking clearly in complete sentences without any shortness of breath.   Alert and oriented x3.   Normal judgment.  No apparent acute distress.   Impression and Recommendations:    1. COVID-19 virus infection Positive COVID-19 virus 2 days ago with symptom onset 4 days ago. She does qualify for monoclonal antibody infusion treatment based on her comorbidities of hypertension and BMI of 32.6. Discussed the option of monoclonal antibody infusion with the patient.  She is interested in this treatment.  I  have placed her on the schedule for 830 tomorrow morning with instructions provided in her after visit summary today. Recommend the patient continue supportive care with alternating Tylenol and ibuprofen for fever and body aches.  Also recommend the use of Mucinex and Delsym to help with increased congestion and cough. Follow-up if symptoms worsen or fail to improve or feel free to call the office if you have any questions.     I discussed the assessment and treatment plan with the patient. The patient was provided an opportunity to ask questions and all were answered. The patient agreed with the plan and demonstrated an understanding of the instructions.   The patient was advised to call back or seek an in-person evaluation if the symptoms worsen or if the condition fails to improve as anticipated.  I provided 20 minutes of non-face-to-face interaction with this [place] visit including intake, same-day documentation, and chart review.   Orma Render, NP

## 2020-08-16 NOTE — Telephone Encounter (Signed)
I connected by phone with Mary Stark on 08/16/2020 at 10:08 AM to discuss the potential use of a new treatment for mild to moderate COVID-19 viral infection in non-hospitalized patients.  This patient is a 65 y.o. female that meets the FDA criteria for Emergency Use Authorization of COVID monoclonal antibody casirivimab/imdevimab.  Has a (+) direct SARS-CoV-2 viral test result  Has mild or moderate COVID-19   Is NOT hospitalized due to COVID-19  Is within 10 days of symptom onset  Has at least one of the high risk factor(s) for progression to severe COVID-19 and/or hospitalization as defined in EUA.  Specific high risk criteria : BMI > 25 and Cardiovascular disease or hypertension   I have spoken and communicated the following to the patient or parent/caregiver regarding COVID monoclonal antibody treatment:  1. FDA has authorized the emergency use for the treatment of mild to moderate COVID-19 in adults and pediatric patients with positive results of direct SARS-CoV-2 viral testing who are 19 years of age and older weighing at least 40 kg, and who are at high risk for progressing to severe COVID-19 and/or hospitalization.  2. The significant known and potential risks and benefits of COVID monoclonal antibody, and the extent to which such potential risks and benefits are unknown.  3. Information on available alternative treatments and the risks and benefits of those alternatives, including clinical trials.  4. Patients treated with COVID monoclonal antibody should continue to self-isolate and use infection control measures (e.g., wear mask, isolate, social distance, avoid sharing personal items, clean and disinfect high touch surfaces, and frequent handwashing) according to CDC guidelines.   5. The patient or parent/caregiver has the option to accept or refuse COVID monoclonal antibody treatment.  After reviewing this information with the patient, The patient agreed to proceed with  receiving casirivimab\imdevimab infusion and will be provided a copy of the Fact sheet prior to receiving the infusion. [name]. [name] 08/16/2020 10:08 AM

## 2020-08-19 ENCOUNTER — Emergency Department (HOSPITAL_COMMUNITY): Payer: No Typology Code available for payment source

## 2020-08-19 ENCOUNTER — Inpatient Hospital Stay (HOSPITAL_COMMUNITY)
Admission: EM | Admit: 2020-08-19 | Discharge: 2020-08-21 | DRG: 177 | Disposition: A | Discharge status: Home / Self Care | Payer: No Typology Code available for payment source | Attending: Internal Medicine | Admitting: Internal Medicine

## 2020-08-19 ENCOUNTER — Other Ambulatory Visit: Payer: Self-pay

## 2020-08-19 ENCOUNTER — Encounter (HOSPITAL_COMMUNITY): Payer: Self-pay | Admitting: *Deleted

## 2020-08-19 DIAGNOSIS — R0602 Shortness of breath: Secondary | ICD-10-CM | POA: Diagnosis present

## 2020-08-19 DIAGNOSIS — E861 Hypovolemia: Secondary | ICD-10-CM | POA: Diagnosis present

## 2020-08-19 DIAGNOSIS — N1831 Chronic kidney disease, stage 3a: Secondary | ICD-10-CM | POA: Diagnosis present

## 2020-08-19 DIAGNOSIS — F418 Other specified anxiety disorders: Secondary | ICD-10-CM | POA: Diagnosis present

## 2020-08-19 DIAGNOSIS — Z9114 Patient's other noncompliance with medication regimen: Secondary | ICD-10-CM

## 2020-08-19 DIAGNOSIS — I10 Essential (primary) hypertension: Secondary | ICD-10-CM | POA: Diagnosis present

## 2020-08-19 DIAGNOSIS — U071 COVID-19: Secondary | ICD-10-CM | POA: Diagnosis present

## 2020-08-19 DIAGNOSIS — I129 Hypertensive chronic kidney disease with stage 1 through stage 4 chronic kidney disease, or unspecified chronic kidney disease: Secondary | ICD-10-CM | POA: Diagnosis present

## 2020-08-19 DIAGNOSIS — Z66 Do not resuscitate: Secondary | ICD-10-CM | POA: Diagnosis present

## 2020-08-19 DIAGNOSIS — K219 Gastro-esophageal reflux disease without esophagitis: Secondary | ICD-10-CM | POA: Diagnosis present

## 2020-08-19 DIAGNOSIS — E785 Hyperlipidemia, unspecified: Secondary | ICD-10-CM | POA: Diagnosis present

## 2020-08-19 DIAGNOSIS — Z7952 Long term (current) use of systemic steroids: Secondary | ICD-10-CM

## 2020-08-19 DIAGNOSIS — Z79899 Other long term (current) drug therapy: Secondary | ICD-10-CM | POA: Diagnosis not present

## 2020-08-19 DIAGNOSIS — Z87891 Personal history of nicotine dependence: Secondary | ICD-10-CM

## 2020-08-19 DIAGNOSIS — Z7982 Long term (current) use of aspirin: Secondary | ICD-10-CM | POA: Diagnosis not present

## 2020-08-19 DIAGNOSIS — E860 Dehydration: Secondary | ICD-10-CM | POA: Diagnosis present

## 2020-08-19 DIAGNOSIS — E871 Hypo-osmolality and hyponatremia: Secondary | ICD-10-CM | POA: Diagnosis present

## 2020-08-19 DIAGNOSIS — J9601 Acute respiratory failure with hypoxia: Secondary | ICD-10-CM | POA: Diagnosis present

## 2020-08-19 DIAGNOSIS — J1282 Pneumonia due to coronavirus disease 2019: Secondary | ICD-10-CM | POA: Diagnosis present

## 2020-08-19 LAB — SODIUM, URINE, RANDOM: Sodium, Ur: <10 mmol/L

## 2020-08-19 LAB — COMPREHENSIVE METABOLIC PANEL
ALT: 21 U/L (ref 0–44)
AST: 33 U/L (ref 15–41)
Albumin: 2.9 g/dL — ABNORMAL LOW (ref 3.5–5.0)
Alkaline Phosphatase: 81 U/L (ref 38–126)
Anion gap: 14 (ref 5–15)
BUN: 17 mg/dL (ref 8–23)
CO2: 22 mmol/L (ref 22–32)
Calcium: 8.2 mg/dL — ABNORMAL LOW (ref 8.9–10.3)
Chloride: 87 mmol/L — ABNORMAL LOW (ref 98–111)
Creatinine, Ser: 1.13 mg/dL — ABNORMAL HIGH (ref 0.44–1.00)
GFR calc Af Amer: >60 mL/min (ref >60)
GFR calc non Af Amer: 52 mL/min — ABNORMAL LOW (ref >60)
Glucose, Bld: 92 mg/dL (ref 70–99)
Potassium: 3.5 mmol/L (ref 3.5–5.1)
Sodium: 123 mmol/L — ABNORMAL LOW (ref 135–145)
Total Bilirubin: 0.3 mg/dL (ref 0.3–1.2)
Total Protein: 7.5 g/dL (ref 6.5–8.1)

## 2020-08-19 LAB — URINALYSIS, ROUTINE W REFLEX MICROSCOPIC
Bilirubin Urine: NEGATIVE
Glucose, UA: NEGATIVE mg/dL
Ketones, ur: NEGATIVE mg/dL
Nitrite: NEGATIVE
Protein, ur: 100 mg/dL — AB
RBC / HPF: >50 RBC/hpf — ABNORMAL HIGH (ref 0–5)
Specific Gravity, Urine: 1.016 (ref 1.005–1.030)
pH: 5 (ref 5.0–8.0)

## 2020-08-19 LAB — CBC WITH DIFFERENTIAL/PLATELET
Abs Immature Granulocytes: 0.17 10*3/uL — ABNORMAL HIGH (ref 0.00–0.07)
Basophils Absolute: 0 10*3/uL (ref 0.0–0.1)
Basophils Relative: 0 %
Eosinophils Absolute: 0 10*3/uL (ref 0.0–0.5)
Eosinophils Relative: 0 %
HCT: 36.2 % (ref 36.0–46.0)
Hemoglobin: 12.1 g/dL (ref 12.0–15.0)
Immature Granulocytes: 1 %
Lymphocytes Relative: 11 %
Lymphs Abs: 1.4 10*3/uL (ref 0.7–4.0)
MCH: 30 pg (ref 26.0–34.0)
MCHC: 33.4 g/dL (ref 30.0–36.0)
MCV: 89.6 fL (ref 80.0–100.0)
Monocytes Absolute: 0.5 10*3/uL (ref 0.1–1.0)
Monocytes Relative: 3 %
Neutro Abs: 11.3 10*3/uL — ABNORMAL HIGH (ref 1.7–7.7)
Neutrophils Relative %: 85 %
Platelets: 292 10*3/uL (ref 150–400)
RBC: 4.04 MIL/uL (ref 3.87–5.11)
RDW: 13.8 % (ref 11.5–15.5)
WBC: 13.4 10*3/uL — ABNORMAL HIGH (ref 4.0–10.5)
nRBC: 0 % (ref 0.0–0.2)

## 2020-08-19 LAB — TROPONIN I (HIGH SENSITIVITY)
Troponin I (High Sensitivity): 6 ng/L (ref <18)
Troponin I (High Sensitivity): 6 ng/L (ref <18)

## 2020-08-19 LAB — LACTATE DEHYDROGENASE: LDH: 266 U/L — ABNORMAL HIGH (ref 98–192)

## 2020-08-19 LAB — D-DIMER, QUANTITATIVE: D-Dimer, Quant: 1.55 ug/mL-FEU — ABNORMAL HIGH (ref 0.00–0.50)

## 2020-08-19 LAB — OSMOLALITY: Osmolality: 261 mOsm/kg — ABNORMAL LOW (ref 275–295)

## 2020-08-19 LAB — LACTIC ACID, PLASMA: Lactic Acid, Venous: 1.2 mmol/L (ref 0.5–1.9)

## 2020-08-19 LAB — TRIGLYCERIDES: Triglycerides: 194 mg/dL — ABNORMAL HIGH (ref <150)

## 2020-08-19 LAB — HIV ANTIBODY (ROUTINE TESTING W REFLEX): HIV Screen 4th Generation wRfx: NONREACTIVE

## 2020-08-19 LAB — OSMOLALITY, URINE: Osmolality, Ur: 349 mOsm/kg (ref 300–900)

## 2020-08-19 LAB — C-REACTIVE PROTEIN: CRP: 19.2 mg/dL — ABNORMAL HIGH (ref <1.0)

## 2020-08-19 LAB — PROCALCITONIN: Procalcitonin: 0.36 ng/mL

## 2020-08-19 LAB — POC SARS CORONAVIRUS 2 AG -  ED: SARS Coronavirus 2 Ag: POSITIVE — AB

## 2020-08-19 LAB — FERRITIN: Ferritin: 841 ng/mL — ABNORMAL HIGH (ref 11–307)

## 2020-08-19 LAB — FIBRINOGEN: Fibrinogen: 544 mg/dL — ABNORMAL HIGH (ref 210–475)

## 2020-08-19 MED ORDER — IOHEXOL 350 MG/ML SOLN
100.0000 mL | Freq: Once | INTRAVENOUS | Status: AC | PRN
  Administered 2020-08-19: 100 mL via INTRAVENOUS

## 2020-08-19 MED ORDER — SODIUM CHLORIDE 0.9 % IV SOLN
100.0000 mg | Freq: Every day | INTRAVENOUS | Status: DC
  Administered 2020-08-20 – 2020-08-21 (×2): 100 mg via INTRAVENOUS
  Filled 2020-08-19 (×2): qty 20

## 2020-08-19 MED ORDER — GUAIFENESIN-DM 100-10 MG/5ML PO SYRP
10.0000 mL | ORAL_SOLUTION | ORAL | Status: DC | PRN
  Administered 2020-08-20 (×2): 10 mL via ORAL
  Filled 2020-08-19 (×2): qty 10

## 2020-08-19 MED ORDER — PREDNISONE 20 MG PO TABS: 50.0000 mg | ORAL_TABLET | Freq: Every day | ORAL | Status: DC

## 2020-08-19 MED ORDER — ONDANSETRON HCL 4 MG PO TABS: 4.0000 mg | ORAL_TABLET | Freq: Four times a day (QID) | ORAL | Status: DC | PRN

## 2020-08-19 MED ORDER — GABAPENTIN 300 MG PO CAPS: 300.0000 mg | ORAL_CAPSULE | ORAL | Status: DC

## 2020-08-19 MED ORDER — SODIUM CHLORIDE 0.9 % IV SOLN
200.0000 mg | Freq: Once | INTRAVENOUS | Status: AC
  Administered 2020-08-19: 200 mg via INTRAVENOUS
  Filled 2020-08-19: qty 200

## 2020-08-19 MED ORDER — GABAPENTIN 400 MG PO CAPS
1200.0000 mg | ORAL_CAPSULE | Freq: Every day | ORAL | Status: DC
  Administered 2020-08-19 – 2020-08-20 (×2): 1200 mg via ORAL
  Filled 2020-08-19: qty 4
  Filled 2020-08-19: qty 3

## 2020-08-19 MED ORDER — SIMVASTATIN 20 MG PO TABS
20.0000 mg | ORAL_TABLET | Freq: Every day | ORAL | Status: DC
  Administered 2020-08-19 – 2020-08-20 (×2): 20 mg via ORAL
  Filled 2020-08-19 (×2): qty 1

## 2020-08-19 MED ORDER — ASPIRIN EC 81 MG PO TBEC
81.0000 mg | DELAYED_RELEASE_TABLET | Freq: Every day | ORAL | Status: DC
  Administered 2020-08-19 – 2020-08-21 (×3): 81 mg via ORAL
  Filled 2020-08-19 (×3): qty 1

## 2020-08-19 MED ORDER — HYDROCOD POLST-CPM POLST ER 10-8 MG/5ML PO SUER: 5.0000 mL | Freq: Two times a day (BID) | ORAL | Status: DC | PRN

## 2020-08-19 MED ORDER — CLONAZEPAM 0.5 MG PO TABS
0.5000 mg | ORAL_TABLET | Freq: Every day | ORAL | Status: DC
  Administered 2020-08-19 – 2020-08-20 (×2): 0.5 mg via ORAL
  Filled 2020-08-19 (×2): qty 1

## 2020-08-19 MED ORDER — ONDANSETRON HCL 4 MG/2ML IJ SOLN: 4.0000 mg | Freq: Four times a day (QID) | INTRAMUSCULAR | Status: DC | PRN

## 2020-08-19 MED ORDER — AMLODIPINE BESYLATE 5 MG PO TABS
5.0000 mg | ORAL_TABLET | Freq: Every evening | ORAL | Status: DC
  Administered 2020-08-20: 5 mg via ORAL
  Filled 2020-08-19: qty 1

## 2020-08-19 MED ORDER — GABAPENTIN 300 MG PO CAPS
300.0000 mg | ORAL_CAPSULE | Freq: Two times a day (BID) | ORAL | Status: DC
  Administered 2020-08-20 – 2020-08-21 (×3): 300 mg via ORAL
  Filled 2020-08-19 (×6): qty 1

## 2020-08-19 MED ORDER — ALBUTEROL SULFATE HFA 108 (90 BASE) MCG/ACT IN AERS: 2.0000 | INHALATION_SPRAY | Freq: Four times a day (QID) | RESPIRATORY_TRACT | Status: DC | PRN

## 2020-08-19 MED ORDER — SODIUM CHLORIDE 0.9 % IV BOLUS
500.0000 mL | Freq: Once | INTRAVENOUS | Status: AC
  Administered 2020-08-19: 500 mL via INTRAVENOUS

## 2020-08-19 MED ORDER — ZINC SULFATE 220 (50 ZN) MG PO CAPS
220.0000 mg | ORAL_CAPSULE | Freq: Every day | ORAL | Status: DC
  Administered 2020-08-19 – 2020-08-21 (×3): 220 mg via ORAL
  Filled 2020-08-19 (×3): qty 1

## 2020-08-19 MED ORDER — ASCORBIC ACID 500 MG PO TABS
500.0000 mg | ORAL_TABLET | Freq: Every day | ORAL | Status: DC
  Administered 2020-08-19 – 2020-08-20 (×2): 500 mg via ORAL
  Filled 2020-08-19 (×3): qty 1

## 2020-08-19 MED ORDER — NEBIVOLOL HCL 10 MG PO TABS
20.0000 mg | ORAL_TABLET | Freq: Every day | ORAL | Status: DC
  Administered 2020-08-20 – 2020-08-21 (×2): 20 mg via ORAL
  Filled 2020-08-19 (×2): qty 2

## 2020-08-19 MED ORDER — SODIUM CHLORIDE 0.9 % IV SOLN: INTRAVENOUS | Status: AC

## 2020-08-19 MED ORDER — METHYLPREDNISOLONE SODIUM SUCC 125 MG IJ SOLR
0.5000 mg/kg | Freq: Two times a day (BID) | INTRAMUSCULAR | Status: DC
  Administered 2020-08-19 – 2020-08-21 (×4): 45.625 mg via INTRAVENOUS
  Filled 2020-08-19 (×4): qty 2

## 2020-08-19 MED ORDER — ACETAMINOPHEN 325 MG PO TABS: 650.0000 mg | ORAL_TABLET | Freq: Four times a day (QID) | ORAL | Status: DC | PRN

## 2020-08-19 MED ORDER — ENOXAPARIN SODIUM 40 MG/0.4ML ~~LOC~~ SOLN
40.0000 mg | SUBCUTANEOUS | Status: DC
  Administered 2020-08-19 – 2020-08-20 (×2): 40 mg via SUBCUTANEOUS
  Filled 2020-08-19 (×2): qty 0.4

## 2020-08-19 MED ORDER — DEXAMETHASONE SODIUM PHOSPHATE 10 MG/ML IJ SOLN
10.0000 mg | Freq: Once | INTRAMUSCULAR | Status: AC
  Administered 2020-08-19: 10 mg via INTRAVENOUS
  Filled 2020-08-19: qty 1

## 2020-08-19 NOTE — ED Notes (Signed)
Patient reports she is covid vaccinated.

## 2020-08-19 NOTE — ED Triage Notes (Signed)
States she tested Covid + on Wednesday, states sats are in 80's will come up using her inhaler, c/o shob, pain in backarea from coughing.

## 2020-08-19 NOTE — H&P (Signed)
History and Physical    Mary Stark NID:[id number] DOB: May 30, 1956 DOA: 08/19/2020  PCP: [name], DO  Patient coming from: Home  I have personally briefly reviewed patient's old medical records in [place]  Chief Complaint: Dyspnea, hypoxia, positive COVID-19  HPI: Mary Stark is a 64 y.o. female with medical history significant for HTN, HLD, CKD stage IIIa, hepatitis C s/p antiretroviral therapy 2013, depression/anxiety, and history of alcohol use disorder who presents to the ED for evaluation of progressive dyspnea, hypoxia in the setting of COVID-19 viral infection.  Patient states she has been fully vaccinated against COVID-19 infection.  She says she received both doses of the [place] vaccine with last dose given January 2021.  She says she developed a new fever on 08/13/2020.  Following day she had a positive COVID-19 test.  She has been trying to manage at home but she has been having continued symptoms of fevers, chills, diaphoresis, nonproductive cough, chest congestion, dyspnea on exertion, myalgias/body aches, and poor appetite.  She reported some diarrhea which has resolved.  He has not been able to maintain any adequate oral intake over the last few days.  She says she has not been taking her blood pressure medicines the last 4 days due to her blood pressure being on the softer side.  She denies any chest pain, nausea, vomiting, or dysuria.  ED Course:  Initial vitals showed BP 135/84, pulse 95, RR 20, temp 99.9 Fahrenheit, SPO2 93% on room air.  SPO2 dropped to 85-89% on room air with ambulation per ED nursing documentation.  Labs show sodium 123, potassium 3.5, bicarb 22, BUN 17, creatinine 1.13, AST 33, ALT 21, alk phos 81, total bilirubin 0.3, serum glucose 92, WBC 13.4, hemoglobin 12.1, platelets 292,000, lactic acid 1.2, D-dimer 1.55, procalcitonin 0.36, LDH 266, ferritin 841, triglycerides 194, fibrinogen 544, CRP 19.2, high-sensitivity troponin I  6.  POC SARS-CoV-2 antigen is positive.  Portable chest x-ray showed mild chronic bronchitic changes.  CTA chest PE study was negative for PE but did show bilateral groundglass opacities.  Patient was given 500 cc normal saline, IV Decadron 10 mg, and started on IV remdesivir per pharmacy.  The hospitalist service was consulted to admit for further evaluation management.  Review of Systems: All systems reviewed and are negative except as documented in history of present illness above.   Past Medical History:  Diagnosis Date  . Alcohol dependence ([place])   . Anxiety   . Arthritis    Back and lt knee  . Cancer ([place])    skin on nose  . Depression 07/17/2013  . GERD (gastroesophageal reflux disease)   . History of hepatitis C    TX FOR 2012  . History of kidney stones   . History of rheumatic fever AS CHILD   NO PROBLEMS FROM  . Hyperlipidemia 07/17/2013  . Hypertension   . Seasonal allergies   . Toe fracture, right 10/07/2016  . Vitamin D deficiency 07/17/2013    Past Surgical History:  Procedure Laterality Date  . ANAL RECTAL MANOMETRY N/A 01/31/2020   Procedure: ANO RECTAL MANOMETRY;  Surgeon: Arta Silence, MD;  Location: [facility];  Service: Endoscopy;  Laterality: N/A;  . [place]  . CYSTOSCOPY WITH RETROGRADE PYELOGRAM, URETEROSCOPY AND STENT PLACEMENT Bilateral 06/11/2020   Procedure: CYSTOSCOPY WITH RETROGRADE PYELOGRAM, URETEROSCOPY AND STENT PLACEMENT, RIGHT URETERAL BIOPSY;  Surgeon: Robley Fries, MD;  Location: [facility];  Service: Urology;  Laterality: Bilateral;  90 MINS  .  CYSTOSCOPY WITH RETROGRADE PYELOGRAM, URETEROSCOPY AND STENT PLACEMENT Bilateral 07/05/2020   Procedure: CYSTOSCOPY WITH RETROGRADE PYELOGRAM, URETEROSCOPY,  BIOPSIES AND STENT EXCHANGES;  Surgeon: Robley Fries, MD;  [facility];  Service: Urology;  Laterality: Bilateral;  . FINGER SURGERY Right    5th  . GYNECOLOGIC CRYOSURGERY  YRS AGO  .  TONSILLECTOMY  AS CHILD    Social History:  reports that she quit smoking about 9 years ago. Her smoking use included cigarettes. She has a 25.00 pack-year smoking history. She has never used smokeless tobacco. She reports previous alcohol use of about 24.0 - 36.0 standard drinks of alcohol per week. She reports that she does not use drugs.  Allergies  Allergen Reactions  . Metformin And Related Other (See Comments)    Lactic acidosis   . Celebrex [Celecoxib] Other (See Comments)    Dizziness, "makes me feel bad"  . Penicillins Hives and Rash    Has patient had a PCN reaction causing immediate rash, facial/tongue/throat swelling, SOB or lightheadedness with hypotension: Yes Has patient had a PCN reaction causing severe rash involving mucus membranes or skin necrosis:No Has patient had a PCN reaction that required hospitalization: Yes Has patient had a PCN reaction occurring within the last 10 years: No If all of the above answers are "NO", then may proceed with Cephalosporin use.     Family History  Problem Relation Age of Onset  . Ovarian cancer Mother   . Cardiomyopathy Father   . Valvular heart disease Father   . Liver cancer Brother   . Protein C deficiency Brother   . Protein C deficiency Brother   . Stroke Brother      Prior to Admission medications   Medication Sig Start Date End Date Taking? Authorizing Provider  acetaminophen (TYLENOL) 500 MG tablet Take 1,000 mg by mouth every 8 (eight) hours as needed for moderate pain.    Yes [provider]  amLODipine (NORVASC) 5 MG tablet TAKE 1 TABLET BY MOUTH ONCE DAILY Patient taking differently: Take 5 mg by mouth every evening.  06/13/20  Yes [name], DO  ARIPiprazole (ABILIFY) 5 MG tablet Take 5 mg by mouth at bedtime.    Yes [provider]  ascorbic acid (VITAMIN C) 500 MG tablet Take 1,000 mg by mouth every evening.    Yes [provider]  ASPIRIN LOW DOSE 81 MG EC tablet TAKE 1  TABLET BY MOUTH DAILY. Patient taking differently: Take 81 mg by mouth daily.  04/09/20  Yes [name], DO  Benzphetamine HCl 50 MG TABS Take 50 mg by mouth daily.   Yes [provider]  buPROPion (WELLBUTRIN XL) 300 MG 24 hr tablet Take 1 tablet (300 mg total) by mouth daily. 11/22/19  Yes Plovsky, Berneta Sages, MD  BYSTOLIC 20 MG TABS TAKE 1 TABLET (20 MG TOTAL) BY MOUTH DAILY. Patient taking differently: Take 20 mg by mouth daily.  07/03/20  Yes [name], DO  Cholecalciferol (VITAMIN D) 125 MCG (5000 UT) CAPS Take 5,000 Units by mouth every evening.    Yes [provider]  clonazePAM (KLONOPIN) 0.5 MG tablet Take 0.5 mg by mouth at bedtime.  04/15/20  Yes [provider]  Coenzyme Q10 (CO Q-10) 200 MG CAPS Take 200 mg by mouth every evening.    Yes [provider]  fexofenadine (ALLEGRA) 180 MG tablet Take 180 mg by mouth every evening.    Yes [provider]  gabapentin (NEURONTIN) 300 MG capsule  1  qam   4   qhs Patient taking differently: Take 300-1,200 mg by mouth See admin instructions. Take 300 mg by mouth in the morning and afternoon and 1200 mg at night 11/22/19  Yes Plovsky, Berneta Sages, MD  GLUCOSAMINE-CHONDROITIN PO Take 2 tablets by mouth every evening.    Yes [provider]  hydrochlorothiazide (HYDRODIURIL) 25 MG tablet TAKE 1 TABLET (25 MG TOTAL) BY MOUTH DAILY. 06/13/20  Yes [name], DO  Omega-3 Fatty Acids (FISH OIL) 1200 MG CAPS Take 1,200 mg by mouth every evening.    Yes [provider]  omeprazole (PRILOSEC) 20 MG capsule Take 1 capsule (20 mg total) by mouth daily. Patient taking differently: Take 20 mg by mouth every evening.  05/30/20  Yes [name], DO  ondansetron (ZOFRAN-ODT) 4 MG disintegrating tablet Take 1 tablet (4 mg total) by mouth every 8 (eight) hours as needed for nausea or vomiting. 06/03/20  Yes Noe Gens, PA-C  phentermine (ADIPEX-P) 37.5 MG tablet Take 75 mg by mouth  daily.    Yes [provider]  predniSONE (DELTASONE) 50 MG tablet Take 25 mg by mouth daily. 07/19/20  Yes [provider]  PROAIR HFA 108 (90 Base) MCG/ACT inhaler INHALE 1 TO 2 PUFFS BY MOUTH INTO THE LUNGS EVERY 4 HOURS AS NEEDED FOR WHEEZING OR SHORTNESS OF BREATH Patient taking differently: Inhale 1-2 puffs into the lungs every 4 (four) hours as needed for wheezing or shortness of breath.  03/02/19  Yes Trixie Dredge, PA-C  SAXENDA 18 MG/3ML SOPN Inject 3 mg into the skin daily.  02/28/19  Yes [provider]  simvastatin (ZOCOR) 20 MG tablet TAKE 1 TABLET (20 MG TOTAL) BY MOUTH DAILY. Patient taking differently: Take 20 mg by mouth at bedtime.  01/24/20  Yes [name], DO  tamsulosin (FLOMAX) 0.4 MG CAPS capsule Take 0.4 mg by mouth at bedtime. 08/07/20  Yes [provider]  tolterodine (DETROL LA) 4 MG 24 hr capsule Take 4 mg by mouth daily. 07/12/20  Yes [provider]  vitamin B-12 (CYANOCOBALAMIN) 1000 MCG tablet Take 2,000 mcg by mouth every evening.    Yes [provider]  Vitamin E 180 MG (400 UNIT) CAPS Take 400 Units by mouth every evening.    Yes [provider]  predniSONE (DELTASONE) 10 MG tablet Take by mouth. Patient not taking: Reported on 08/19/2020 07/19/20   [provider]    Physical Exam: Vitals:   08/19/20 1600 08/19/20 1730 08/19/20 1830 08/19/20 1900  BP: 133/81 137/81 135/84 132/75  Pulse: 89 85 80 77  Resp: (!) 23 (!) 31 (!) 32 (!) 28  Temp:      TempSrc:      SpO2: 92% 94% 90% 90%  Weight:      Height:       Constitutional: Resting supine in bed with head slightly elevated, NAD, calm, comfortable Eyes: PERRL, lids and conjunctivae normal ENMT: Mucous membranes are dry. Posterior pharynx clear of any exudate or lesions.Normal dentition.  Neck: normal, supple, no masses. Respiratory: Inspiratory crackles throughout the lung fields.  Normal respiratory effort. No  accessory muscle use.  Cardiovascular: Regular rate and rhythm, no murmurs / rubs / gallops. No extremity edema. 2+ pedal pulses. Abdomen: Mild generalized tenderness, no masses palpated. No hepatosplenomegaly. Bowel sounds positive.  Musculoskeletal: no clubbing / cyanosis. No joint deformity upper and lower extremities. Good ROM, no contractures. Normal muscle tone.  Skin: Diaphoretic, no rashes, lesions, ulcers. No  induration Neurologic: CN 2-12 grossly intact. Sensation intact, Strength 5/5 in all 4.  Psychiatric: Normal judgment and insight. Alert and oriented x 3. Normal mood.     Labs on Admission: I have personally reviewed following labs and imaging studies  CBC: Recent Labs  Lab 08/19/20 1548  WBC 13.4*  NEUTROABS 11.3*  HGB 12.1  HCT 36.2  MCV 89.6  PLT 122   Basic Metabolic Panel: Recent Labs  Lab 08/19/20 1548  NA 123*  K 3.5  CL 87*  CO2 22  GLUCOSE 92  BUN 17  CREATININE 1.13*  CALCIUM 8.2*   GFR: Estimated Creatinine Clearance: 58.8 mL/min (A) (by C-G formula based on SCr of 1.13 mg/dL (H)). Liver Function Tests: Recent Labs  Lab 08/19/20 1548  AST 33  ALT 21  ALKPHOS 81  BILITOT 0.3  PROT 7.5  ALBUMIN 2.9*   No results for input(s): LIPASE, AMYLASE in the last 168 hours. No results for input(s): AMMONIA in the last 168 hours. Coagulation Profile: No results for input(s): INR, PROTIME in the last 168 hours. Cardiac Enzymes: No results for input(s): CKTOTAL, CKMB, CKMBINDEX, TROPONINI in the last 168 hours. BNP (last 3 results) No results for input(s): PROBNP in the last 8760 hours. HbA1C: No results for input(s): HGBA1C in the last 72 hours. CBG: No results for input(s): GLUCAP in the last 168 hours. Lipid Profile: Recent Labs    08/19/20 1548  TRIG 194*   Thyroid Function Tests: No results for input(s): TSH, T4TOTAL, FREET4, T3FREE, THYROIDAB in the last 72 hours. Anemia Panel: Recent Labs    08/19/20 1548  FERRITIN 841*    Urine analysis:    Component Value Date/Time   COLORURINE YELLOW 04/24/2020 [place] 04/24/2020 1509   LABSPEC 1.011 04/24/2020 1509   PHURINE 6.5 04/24/2020 1509   GLUCOSEU NEGATIVE 04/24/2020 [place] 04/24/2020 1509   BILIRUBINUR negative 06/03/2020 1359   KETONESUR negative 06/03/2020 [place] 04/24/2020 1509   PROTEINUR negative 06/03/2020 1359   PROTEINUR NEGATIVE 04/24/2020 1509   UROBILINOGEN 0.2 06/03/2020 1359   NITRITE Negative 06/03/2020 1359   NITRITE NEGATIVE 04/24/2020 1509   LEUKOCYTESUR Moderate (2+) (A) 06/03/2020 1359   LEUKOCYTESUR 3+ (A) 04/24/2020 1509    Radiological Exams on Admission: CT Angio Chest PE W and/or Wo Contrast  Result Date: 08/19/2020 CLINICAL DATA:  Chest pain and shortness of breath. Tested positive for COVID-19 five days ago peer EXAM: CT ANGIOGRAPHY CHEST WITH CONTRAST TECHNIQUE: Multidetector CT imaging of the chest was performed using the standard protocol during bolus administration of intravenous contrast. Multiplanar CT image reconstructions and MIPs were obtained to evaluate the vascular anatomy. CONTRAST:  115m OMNIPAQUE IOHEXOL 350 MG/ML SOLN COMPARISON:  Current chest radiograph. FINDINGS: Cardiovascular: There is satisfactory opacification of the pulmonary arteries to the segmental level. There is no evidence of a pulmonary embolism. Heart is normal in size and configuration. No pericardial effusion. No coronary artery calcifications. Great vessels are unremarkable. Mediastinum/Nodes: No enlarged mediastinal, hilar, or axillary lymph nodes. Thyroid gland, trachea, and esophagus demonstrate no significant findings. Lungs/Pleura: There are bilateral ground-glass lung opacities involving both lungs, all lobes. No evidence of pulmonary edema. No pleural effusion or pneumothorax. Upper Abdomen: No acute findings.  Partly imaged left renal stent. Musculoskeletal: No fracture or acute finding. No  osteoblastic or osteolytic lesions. No chest wall masses. Review of the MIP images confirms the above findings. IMPRESSION: 1. No evidence of a pulmonary embolism.  2. Bilateral ground-glass lung opacities consistent with multifocal pneumonia, pattern compatible with COVID-19 infection. Electronically Signed   By: Lajean Manes M.D.   On: 08/19/2020 17:26   DG Chest Port 1 View  Result Date: 08/19/2020 CLINICAL DATA:  Shortness of breath and fever for the past week. Ex-smoker. EXAM: PORTABLE CHEST 1 VIEW COMPARISON:  None. FINDINGS: Normal sized heart. Small amount of linear atelectasis or scarring in the left lower lung zone. Right lung. Minimal peribronchial thickening and prominence of the interstitial markings. Unremarkable bones. IMPRESSION: No acute abnormality.  Minimal chronic bronchitic changes. Electronically Signed   By: Claudie Revering M.D.   On: 08/19/2020 15:12    EKG: Independently reviewed. Sinus rhythm, early R wave progression, first-degree AV block.  Rate is faster when compared to prior.  Assessment/Plan Principal Problem:   Acute hypoxemic respiratory failure due to COVID-19 [facility]) Active Problems:   Dyslipidemia, goal LDL below 100   Depression with anxiety   Essential hypertension   Hyponatremia  Alden JENAYA SAAR is a 64 y.o. female with medical history significant for HTN, HLD, CKD stage IIIa, hepatitis C s/p antiretroviral therapy 2013, depression/anxiety, and history of alcohol use disorder who is admitted with acute hypoxemic respiratory failure due to COVID-19.  Acute hypoxemic respiratory failure due to COVID-19 multifocal pneumonia: Patient with 6 days of symptoms, had outpatient positive COVID-19 testing.  SARS-CoV-2 antigen test positive here 08/19/2020.  She is hypoxic to 85-89% on room air with minimal activity.  O2 saturation at rest is 90%.  CT chest shows bilateral groundglass opacities consistent with multifocal pneumonia.  Patient states she has been fully  vaccinated against COVID-19 with [place], completed second dose January 2021. -Continue IV remdesivir per pharmacy protocol -Continue IV Solu-Medrol 0.5 mg/kg q12h -Supplemental oxygen -Incentive spirometer, flutter valve, albuterol as needed -Antitussives, vitamin C, zinc  Hyponatremia: Likely multifactorial from dehydration and medication effect. -Check urinalysis, urine and serum osmolality, urine sodium -Start IV NS @ 125 mL/hr overnight -Repeat labs in a.m. -Hold home HCTZ, Abilify, Wellbutrin for now  CKD stage IIIa: Chronic and appears stable.  Continue IV fluid hydration as above.  Hypertension: Resume home amlodipine and Bystolic tomorrow.  Holding HCTZ due to hyponatremia.  Hyperlipidemia: Continue simvastatin.  Depression/anxiety: Holding Abilify and Wellbutrin for now due to hyponatremia.  DVT prophylaxis: Lovenox Code Status: DNR, confirmed with patient Family Communication: Discussed with patient, she has discussed with family Disposition Plan: From home and likely discharge to home pending symptomatic and oxygenation improvement, ability to transition to outpatient therapy Consults called: None Admission status:  Status is: Inpatient  Remains inpatient appropriate because:IV treatments appropriate due to intensity of illness or inability to take PO and Inpatient level of care appropriate due to severity of illness   Dispo: The patient is from: Home              Anticipated d/c is to: Home              Anticipated d/c date is: 3 days              Patient currently is not medically stable to d/c.   Zada Finders MD [facility]  If 7PM-7AM, please contact night-coverage [email or web address]  08/19/2020, 7:37 PM

## 2020-08-19 NOTE — ED Notes (Signed)
Pts o2 decreased to 85%-89% on RA during ambulation.

## 2020-08-19 NOTE — ED Provider Notes (Signed)
[facility] DEPT Provider Note   CSN: [id number] Arrival date & time: 08/19/20  1354    History Chief Complaint  Patient presents with  . Covid Positive  . Shortness of Breath    Mary Stark is a 64 y.o. female with past medical history significant for chronic pain, hepatitis C, hypertension, hyperlipidemia, alcohol dependence, denies prior history of DTs or withdrawal seizures.  Presents for evaluation of shortness of breath.  States she tested positive for Covid last Wednesday 08/14/2020.  States was supposed to go to get the medical antibiotic treatment the following day however was unable to as she slept through her alarm.  Patient states she continues to have shortness of breath.  She has pain located to her left posterior back.  Has been using cough medicine without relief of her cough.  Has also been using an albuterol inhaler which she says helps with her shortness of breath.  No leg edema, redness, swelling, warmth.  She denies any anterior chest pain.  Pain does not center left arm, left jaw.  No associated diaphoresis, nausea or vomiting.  Feels generally weak.  Denies headache, lightheadedness, dizziness, nausea, vomiting, abdominal pain, diarrhea, dysuria, rashes or lesions.  Denies aggravating or alleviating factors.  Has continued to run almost daily fevers.  She is taking Tylenol, 1 tablet every 6 hours for this.  She is not taking Motrin.  Patient states that she has been hypoxic into the 80's at home with some improvement to low 90's however returns to high 80's.  History obtained from patient and past medical records. No interpretor was used.  HPI     Past Medical History:  Diagnosis Date  . Alcohol dependence ([place])   . Anxiety   . Arthritis    Back and lt knee  . Cancer ([place])    skin on nose  . Depression 07/17/2013  . GERD (gastroesophageal reflux disease)   . History of hepatitis C    TX FOR 2012  . History of kidney stones   .  History of rheumatic fever AS CHILD   NO PROBLEMS FROM  . Hyperlipidemia 07/17/2013  . Hypertension   . Seasonal allergies   . Toe fracture, right 10/07/2016  . Vitamin D deficiency 07/17/2013    Patient Active Problem List   Diagnosis Date Noted  . Acute hypoxemic respiratory failure due to COVID-19 ([place]) 08/19/2020  . Hyponatremia 08/19/2020  . Tear of medial meniscus of left knee, current 07/13/2019  . Encounter for monitoring NSAID therapy 07/13/2019  . Internal derangement of knee involving posterior horn of lateral meniscus, left 07/13/2019  . Primary osteoarthritis of left knee 07/13/2019  . Renal insufficiency 07/13/2019  . Anxiety about health 03/22/2019  . Hypokalemia 03/21/2019  . Chronic pain of left knee 02/13/2019  . Essential hypertension 02/13/2019  . 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/2018  . Anemia due to blood loss, acute 04/14/2018  . Hypomagnesemia 04/14/2018  . Hypocalcemia 04/14/2018  . Tobacco abuse 04/08/2018  . Depression 04/07/2018  . AKI (acute kidney injury) ([place]) 04/07/2018  . Sepsis ([place]) 04/07/2018  . Acute colitis 04/07/2018  . Avascular necrosis of humeral head, right ([place]) 03/30/2018  . NSAID long-term use 03/30/2018  . Encounter for weight loss counseling 03/30/2018  . Seborrheic keratoses 03/15/2018  . History of nonmelanoma skin cancer 03/02/2018  . Skin lesion of chest wall 03/02/2018  . Alcohol dependence with unspecified alcohol-induced disorder ([place]) 02/11/2018  . Severe episode of recurrent major depressive disorder, without psychotic  features ([place]) 02/11/2018  . Transaminitis 01/06/2018  . Nicotine dependence 01/06/2018  . Heavy alcohol consumption 01/06/2018  . Encounter for monitoring statin therapy 01/06/2018  . Class 1 obesity due to excess calories with serious comorbidity in adult 01/06/2018  . Chronic pain syndrome 04/21/2017  . Chronic right shoulder pain 07/03/2015  . Vaginismus 04/12/2014  . Menopausal state 11/08/2013  . Family history  of ovarian cancer 09/27/2013  . 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 Personal history of colonic polyps 07/18/2013  . Dyslipidemia, goal LDL below 100 07/17/2013  . Depression with anxiety 07/17/2013  . Hx of hepatitis C 07/17/2013  . Vitamin D deficiency 07/17/2013  . Right lumbar radiculopathy 07/17/2013  . History of alcohol dependence ([place]) 07/17/2013  . H/O: rheumatic fever 06/30/2012  . Insomnia 06/30/2012    Past Surgical History:  Procedure Laterality Date  . ANAL RECTAL MANOMETRY N/A 01/31/2020   Procedure: ANO RECTAL MANOMETRY;  Surgeon: Arta Silence, MD;  Location: [facility];  Service: Endoscopy;  Laterality: N/A;  . [place]  . CYSTOSCOPY WITH RETROGRADE PYELOGRAM, URETEROSCOPY AND STENT PLACEMENT Bilateral 06/11/2020   Procedure: CYSTOSCOPY WITH RETROGRADE PYELOGRAM, URETEROSCOPY AND STENT PLACEMENT, RIGHT URETERAL BIOPSY;  Surgeon: Robley Fries, MD;  Location: [facility];  Service: Urology;  Laterality: Bilateral;  90 MINS  . CYSTOSCOPY WITH RETROGRADE PYELOGRAM, URETEROSCOPY AND STENT PLACEMENT Bilateral 07/05/2020   Procedure: CYSTOSCOPY WITH RETROGRADE PYELOGRAM, URETEROSCOPY,  BIOPSIES AND STENT EXCHANGES;  Surgeon: Robley Fries, MD;  Location: [facility];  Service: Urology;  Laterality: Bilateral;  . FINGER SURGERY Right    5th  . GYNECOLOGIC CRYOSURGERY  YRS AGO  . TONSILLECTOMY  AS CHILD     OB History   No obstetric history on file.     Family History  Problem Relation Age of Onset  . Ovarian cancer Mother   . Cardiomyopathy Father   . Valvular heart disease Father   . Liver cancer Brother   . Protein C deficiency Brother   . Protein C deficiency Brother   . Stroke Brother     Social History   Tobacco Use  . Smoking status: Former Smoker    Packs/day: 1.00    Years: 25.00    Pack years: 25.00    Types: Cigarettes    Quit date: 2012    Years since quitting: 9.6  . Smokeless tobacco: Never Used   Vaping Use  . Vaping Use: Every day  . Substances: Nicotine  Substance Use Topics  . Alcohol use: Not Currently    Alcohol/week: 24.0 - 36.0 standard drinks    Types: 24 - 36 Cans of beer per week    Comment: Pt reports that she stopped march 2021  . Drug use: No    Home Medications Prior to Admission medications   Medication Sig Start Date End Date Taking? Authorizing Provider  acetaminophen (TYLENOL) 500 MG tablet Take 1,000 mg by mouth every 8 (eight) hours as needed for moderate pain.    Yes [provider]  amLODipine (NORVASC) 5 MG tablet TAKE 1 TABLET BY MOUTH ONCE DAILY Patient taking differently: Take 5 mg by mouth every evening.  06/13/20  Yes [name], DO  ARIPiprazole (ABILIFY) 5 MG tablet Take 5 mg by mouth at bedtime.    Yes [provider]  ascorbic acid (VITAMIN C) 500 MG tablet Take 1,000 mg by mouth every evening.    Yes [provider]  ASPIRIN LOW DOSE 81 MG  EC tablet TAKE 1 TABLET BY MOUTH DAILY. Patient taking differently: Take 81 mg by mouth daily.  04/09/20  Yes [name], DO  Benzphetamine HCl 50 MG TABS Take 50 mg by mouth daily.   Yes [provider]  buPROPion (WELLBUTRIN XL) 300 MG 24 hr tablet Take 1 tablet (300 mg total) by mouth daily. 11/22/19  Yes Plovsky, Berneta Sages, MD  BYSTOLIC 20 MG TABS TAKE 1 TABLET (20 MG TOTAL) BY MOUTH DAILY. Patient taking differently: Take 20 mg by mouth daily.  07/03/20  Yes [name], DO  Cholecalciferol (VITAMIN D) 125 MCG (5000 UT) CAPS Take 5,000 Units by mouth every evening.    Yes [provider]  clonazePAM (KLONOPIN) 0.5 MG tablet Take 0.5 mg by mouth at bedtime.  04/15/20  Yes [provider]  Coenzyme Q10 (CO Q-10) 200 MG CAPS Take 200 mg by mouth every evening.    Yes [provider]  fexofenadine (ALLEGRA) 180 MG tablet Take 180 mg by mouth every evening.    Yes [provider]  gabapentin (NEURONTIN) 300 MG capsule 1  qam    4   qhs Patient taking differently: Take 300-1,200 mg by mouth See admin instructions. Take 300 mg by mouth in the morning and afternoon and 1200 mg at night 11/22/19  Yes Plovsky, Berneta Sages, MD  GLUCOSAMINE-CHONDROITIN PO Take 2 tablets by mouth every evening.    Yes [provider]  hydrochlorothiazide (HYDRODIURIL) 25 MG tablet TAKE 1 TABLET (25 MG TOTAL) BY MOUTH DAILY. 06/13/20  Yes [name], DO  Omega-3 Fatty Acids (FISH OIL) 1200 MG CAPS Take 1,200 mg by mouth every evening.    Yes [provider]  omeprazole (PRILOSEC) 20 MG capsule Take 1 capsule (20 mg total) by mouth daily. Patient taking differently: Take 20 mg by mouth every evening.  05/30/20  Yes [name], DO  ondansetron (ZOFRAN-ODT) 4 MG disintegrating tablet Take 1 tablet (4 mg total) by mouth every 8 (eight) hours as needed for nausea or vomiting. 06/03/20  Yes Noe Gens, PA-C  phentermine (ADIPEX-P) 37.5 MG tablet Take 75 mg by mouth daily.    Yes [provider]  predniSONE (DELTASONE) 50 MG tablet Take 25 mg by mouth daily. 07/19/20  Yes [provider]  PROAIR HFA 108 (90 Base) MCG/ACT inhaler INHALE 1 TO 2 PUFFS BY MOUTH INTO THE LUNGS EVERY 4 HOURS AS NEEDED FOR WHEEZING OR SHORTNESS OF BREATH Patient taking differently: Inhale 1-2 puffs into the lungs every 4 (four) hours as needed for wheezing or shortness of breath.  03/02/19  Yes Trixie Dredge, PA-C  SAXENDA 18 MG/3ML SOPN Inject 3 mg into the skin daily.  02/28/19  Yes [provider]  simvastatin (ZOCOR) 20 MG tablet TAKE 1 TABLET (20 MG TOTAL) BY MOUTH DAILY. Patient taking differently: Take 20 mg by mouth at bedtime.  01/24/20  Yes [name], DO  tamsulosin (FLOMAX) 0.4 MG CAPS capsule Take 0.4 mg by mouth at bedtime. 08/07/20  Yes [provider]  tolterodine (DETROL LA) 4 MG 24 hr capsule Take 4 mg by mouth daily. 07/12/20  Yes [provider]  vitamin B-12  (CYANOCOBALAMIN) 1000 MCG tablet Take 2,000 mcg by mouth every evening.    Yes [provider]  Vitamin E 180 MG (400 UNIT) CAPS Take 400 Units by mouth every evening.    Yes [provider]  predniSONE (DELTASONE) 10 MG tablet Take by mouth. Patient not taking: Reported on 08/19/2020  07/19/20   [provider]    Allergies    Metformin and related, Celebrex [celecoxib], and Penicillins  Review of Systems   Review of Systems  Constitutional: Positive for activity change, appetite change, fatigue and fever.  HENT: Positive for congestion, postnasal drip and rhinorrhea.   Eyes: Negative.   Respiratory: Positive for cough and shortness of breath. Negative for apnea, choking, chest tightness, wheezing and stridor.   Cardiovascular: Negative.   Gastrointestinal: Negative.   Genitourinary: Negative.   Musculoskeletal: Positive for back pain.  Skin: Negative.   Neurological: Negative.   All other systems reviewed and are negative.   Physical Exam Updated Vital Signs BP 137/81   Pulse 85   Temp 99.9 F (37.7 C) (Oral)   Resp (!) 31   Ht 5\' 6"  (1.676 m)   Wt 91.6 kg   SpO2 94%   BMI 32.60 kg/m   Physical Exam Vitals and nursing note reviewed.  Constitutional:      General: She is not in acute distress.    Appearance: She is not ill-appearing, toxic-appearing or diaphoretic.  HENT:     Head: Normocephalic and atraumatic.     Jaw: There is normal jaw occlusion.     Right Ear: Tympanic membrane, ear canal and external ear normal. There is no impacted cerumen. No hemotympanum. Tympanic membrane is not injected, scarred, perforated, erythematous, retracted or bulging.     Left Ear: Tympanic membrane, ear canal and external ear normal. There is no impacted cerumen. No hemotympanum. Tympanic membrane is not injected, scarred, perforated, erythematous, retracted or bulging.     Ears:     Comments: No Mastoid tenderness.    Nose:     Comments: Clear rhinorrhea  and congestion to bilateral nares.  No sinus tenderness.    Mouth/Throat:     Comments: Posterior oropharynx clear.  Mucous membranes moist.  Tonsils without erythema or exudate.  Uvula midline without deviation.  No evidence of PTA or RPA.  No drooling, dysphasia or trismus.  Phonation normal. Neck:     Trachea: Trachea and phonation normal.     Meningeal: Brudzinski's sign and Kernig's sign absent.     Comments: No Neck stiffness or neck rigidity.  No meningismus.  No cervical lymphadenopathy. Cardiovascular:     Comments: No murmurs rubs or gallops. Pulmonary:     Comments: Clear to auscultation bilaterally without wheeze, rhonchi or rales.  No accessory muscle usage.  Able speak in full sentences. Abdominal:     General: Bowel sounds are normal.     Palpations: Abdomen is soft.     Tenderness: There is no abdominal tenderness. There is no guarding or rebound.     Comments: Soft, nontender without rebound or guarding.  No CVA tenderness.  Musculoskeletal:        General: Normal range of motion.     Right lower leg: No tenderness. No edema.     Left lower leg: No tenderness. No edema.     Comments: Moves all 4 extremities without difficulty.  Lower extremities without edema, erythema or warmth.  Skin:    General: Skin is warm.     Capillary Refill: Capillary refill takes less than 2 seconds.     Comments: Brisk capillary refill.  No rashes or lesions.  Neurological:     General: No focal deficit present.     Mental Status: She is alert and oriented to person, place, and time.     Comments: Ambulatory in department without  difficulty.  Cranial nerves II through XII grossly intact.  No facial droop.  No aphasia.     ED Results / Procedures / Treatments   Labs (all labs ordered are listed, but only abnormal results are displayed) Labs Reviewed  CBC WITH DIFFERENTIAL/PLATELET - Abnormal; Notable for the following components:      Result Value   WBC 13.4 (*)    Neutro Abs 11.3 (*)     Abs Immature Granulocytes 0.17 (*)    All other components within normal limits  COMPREHENSIVE METABOLIC PANEL - Abnormal; Notable for the following components:   Sodium 123 (*)    Chloride 87 (*)    Creatinine, Ser 1.13 (*)    Calcium 8.2 (*)    Albumin 2.9 (*)    GFR calc non Af Amer 52 (*)    All other components within normal limits  D-DIMER, QUANTITATIVE (NOT AT [facility]) - Abnormal; Notable for the following components:   D-Dimer, Quant 1.55 (*)    All other components within normal limits  LACTATE DEHYDROGENASE - Abnormal; Notable for the following components:   LDH 266 (*)    All other components within normal limits  FERRITIN - Abnormal; Notable for the following components:   Ferritin 841 (*)    All other components within normal limits  TRIGLYCERIDES - Abnormal; Notable for the following components:   Triglycerides 194 (*)    All other components within normal limits  FIBRINOGEN - Abnormal; Notable for the following components:   Fibrinogen 544 (*)    All other components within normal limits  C-REACTIVE PROTEIN - Abnormal; Notable for the following components:   CRP 19.2 (*)    All other components within normal limits  POC SARS CORONAVIRUS 2 AG -  ED - Abnormal; Notable for the following components:   SARS Coronavirus 2 Ag POSITIVE (*)    All other components within normal limits  CULTURE, BLOOD (ROUTINE X 2)  CULTURE, BLOOD (ROUTINE X 2)  LACTIC ACID, PLASMA  PROCALCITONIN  URINALYSIS, ROUTINE W REFLEX MICROSCOPIC  SODIUM, URINE, RANDOM  OSMOLALITY, URINE  OSMOLALITY  TROPONIN I (HIGH SENSITIVITY)  TROPONIN I (HIGH SENSITIVITY)    EKG None  Radiology CT Angio Chest PE W and/or Wo Contrast  Result Date: 08/19/2020 CLINICAL DATA:  Chest pain and shortness of breath. Tested positive for COVID-19 five days ago peer EXAM: CT ANGIOGRAPHY CHEST WITH CONTRAST TECHNIQUE: Multidetector CT imaging of the chest was performed using the standard protocol during bolus  administration of intravenous contrast. Multiplanar CT image reconstructions and MIPs were obtained to evaluate the vascular anatomy. CONTRAST:  145mL OMNIPAQUE IOHEXOL 350 MG/ML SOLN COMPARISON:  Current chest radiograph. FINDINGS: Cardiovascular: There is satisfactory opacification of the pulmonary arteries to the segmental level. There is no evidence of a pulmonary embolism. Heart is normal in size and configuration. No pericardial effusion. No coronary artery calcifications. Great vessels are unremarkable. Mediastinum/Nodes: No enlarged mediastinal, hilar, or axillary lymph nodes. Thyroid gland, trachea, and esophagus demonstrate no significant findings. Lungs/Pleura: There are bilateral ground-glass lung opacities involving both lungs, all lobes. No evidence of pulmonary edema. No pleural effusion or pneumothorax. Upper Abdomen: No acute findings.  Partly imaged left renal stent. Musculoskeletal: No fracture or acute finding. No osteoblastic or osteolytic lesions. No chest wall masses. Review of the MIP images confirms the above findings. IMPRESSION: 1. No evidence of a pulmonary embolism. 2. Bilateral ground-glass lung opacities consistent with multifocal pneumonia, pattern compatible with COVID-19 infection. Electronically Signed  By: Lajean Manes M.D.   On: 08/19/2020 17:26   DG Chest Port 1 View  Result Date: 08/19/2020 CLINICAL DATA:  Shortness of breath and fever for the past week. Ex-smoker. EXAM: PORTABLE CHEST 1 VIEW COMPARISON:  None. FINDINGS: Normal sized heart. Small amount of linear atelectasis or scarring in the left lower lung zone. Right lung. Minimal peribronchial thickening and prominence of the interstitial markings. Unremarkable bones. IMPRESSION: No acute abnormality.  Minimal chronic bronchitic changes. Electronically Signed   By: Claudie Revering M.D.   On: 08/19/2020 15:12    Procedures .Critical Care Performed by: Nettie Elm, PA-C Authorized by: Nettie Elm, PA-C    Critical care provider statement:    Critical care time (minutes):  45   Critical care was necessary to treat or prevent imminent or life-threatening deterioration of the following conditions:  Respiratory failure and metabolic crisis   Critical care was time spent personally by me on the following activities:  Discussions with consultants, evaluation of patient's response to treatment, examination of patient, ordering and performing treatments and interventions, ordering and review of laboratory studies, ordering and review of radiographic studies, pulse oximetry, re-evaluation of patient's condition, obtaining history from patient or surrogate and review of old charts   (including critical care time)  Medications Ordered in ED Medications  remdesivir 200 mg in sodium chloride 0.9% 250 mL IVPB (has no administration in time range)    Followed by  remdesivir 100 mg in sodium chloride 0.9 % 100 mL IVPB (has no administration in time range)  dexamethasone (DECADRON) injection 10 mg (10 mg Intravenous Given 08/19/20 1714)  iohexol (OMNIPAQUE) 350 MG/ML injection 100 mL (100 mLs Intravenous Contrast Given 08/19/20 1711)  sodium chloride 0.9 % bolus 500 mL (500 mLs Intravenous New Bag/Given (Non-Interop) 08/19/20 1758)   ED Course  I have reviewed the triage vital signs and the nursing notes.  Pertinent labs & imaging results that were available during my care of the patient were reviewed by me and considered in my medical decision making (see chart for details).  70 old female presents for evaluation of Covid symptoms and shortness of breath.  Has some right scapular pain.  No recent injury or trauma.  Shortness of breath is worse with ambulation.  She is afebrile, nonseptic, non-ill-appearing.  She is Covid vaccinated with [employer].  Already p.o. intake at home without difficulty.  Heart lungs clear.  Abdomen soft, nontender.  Clinically no evidence of DVT on exam.  Given back pain, upper respiratory  symptoms, known hypercoagulable state of Covid will obtain CTA to rule out PE  Labs and imaging personally reviewed and interpreted:  CBC with leukocytosis at 95.6 Metabolic panel hyponatremia to 123, creatinine 1.13 Lactic acid 1.2 D-dimer 1.55 Trop 6 Pro-calcitonin 0.36 DG chest chronic bronchitic changes CTA with diffuse multifocal pneumonia consistent with Covid pneumonia.  No PE EKG without STEMI  Patient reassessed. She is not hypoxic with rest, oxygen saturation 91-92 % on room air however has some tachypnea to the high 20s, low 30s.  Unfortunately when patient ambulated, to stepped off the bed she became hypoxic to 85%.  Unfortunately I do not see a documented positive Covid test however I do see telemedicine visits where patient discussed that she was positive.  States she does not have a copy of her positive Covid test as she states she was called with this on 08/14/2020.  We will plan on retesting given no known documented positive COVID in Epic and  needing admission for acute hypoxic respiratory failure.  CONSULT with Dr. Posey Pronto with [facility] who agrees to evaluate patient for admission.  The patient appears reasonably stabilized for admission considering the current resources, flow, and capabilities available in the ED at this time, and I doubt any other [facility] requiring further screening and/or treatment in the ED prior to admission.     MDM Rules/Calculators/A&P                          Mary Stark was evaluated in Emergency Department on 08/19/2020 for the symptoms described in the history of present illness. She was evaluated in the context of the global COVID-19 pandemic, which necessitated consideration that the patient might be at risk for infection with the SARS-CoV-2 virus that causes COVID-19. Institutional protocols and algorithms that pertain to the evaluation of patients at risk for COVID-19 are in a state of rapid change based on information released by regulatory  bodies including the CDC and federal and state organizations. These policies and algorithms were followed during the patient's care in the ED. Final Clinical Impression(s) / ED Diagnoses Final diagnoses:  COVID-19  Acute respiratory failure with hypoxia ([place])  Hyponatremia  Pneumonia due to COVID-19 virus    Rx / DC Orders ED Discharge Orders    None       Daveyon Kitchings A, PA-C 08/19/20 1847    Milton Ferguson, MD 08/20/20 [phone]

## 2020-08-20 LAB — CBC WITH DIFFERENTIAL/PLATELET
Abs Immature Granulocytes: 0.14 10*3/uL — ABNORMAL HIGH (ref 0.00–0.07)
Basophils Absolute: 0 10*3/uL (ref 0.0–0.1)
Basophils Relative: 0 %
Eosinophils Absolute: 0 10*3/uL (ref 0.0–0.5)
Eosinophils Relative: 0 %
HCT: 33 % — ABNORMAL LOW (ref 36.0–46.0)
Hemoglobin: 11 g/dL — ABNORMAL LOW (ref 12.0–15.0)
Immature Granulocytes: 1 %
Lymphocytes Relative: 8 %
Lymphs Abs: 0.8 10*3/uL (ref 0.7–4.0)
MCH: 30.5 pg (ref 26.0–34.0)
MCHC: 33.3 g/dL (ref 30.0–36.0)
MCV: 91.4 fL (ref 80.0–100.0)
Monocytes Absolute: 0.3 10*3/uL (ref 0.1–1.0)
Monocytes Relative: 3 %
Neutro Abs: 9.4 10*3/uL — ABNORMAL HIGH (ref 1.7–7.7)
Neutrophils Relative %: 88 %
Platelets: 300 10*3/uL (ref 150–400)
RBC: 3.61 MIL/uL — ABNORMAL LOW (ref 3.87–5.11)
RDW: 13.9 % (ref 11.5–15.5)
WBC: 10.6 10*3/uL — ABNORMAL HIGH (ref 4.0–10.5)
nRBC: 0 % (ref 0.0–0.2)

## 2020-08-20 LAB — COMPREHENSIVE METABOLIC PANEL
ALT: 19 U/L (ref 0–44)
AST: 28 U/L (ref 15–41)
Albumin: 2.7 g/dL — ABNORMAL LOW (ref 3.5–5.0)
Alkaline Phosphatase: 75 U/L (ref 38–126)
Anion gap: 15 (ref 5–15)
BUN: 15 mg/dL (ref 8–23)
CO2: 23 mmol/L (ref 22–32)
Calcium: 8.4 mg/dL — ABNORMAL LOW (ref 8.9–10.3)
Chloride: 94 mmol/L — ABNORMAL LOW (ref 98–111)
Creatinine, Ser: 0.9 mg/dL (ref 0.44–1.00)
GFR calc Af Amer: >60 mL/min (ref >60)
GFR calc non Af Amer: >60 mL/min (ref >60)
Glucose, Bld: 139 mg/dL — ABNORMAL HIGH (ref 70–99)
Potassium: 3.9 mmol/L (ref 3.5–5.1)
Sodium: 132 mmol/L — ABNORMAL LOW (ref 135–145)
Total Bilirubin: 0.3 mg/dL (ref 0.3–1.2)
Total Protein: 6.9 g/dL (ref 6.5–8.1)

## 2020-08-20 LAB — GLUCOSE, CAPILLARY
Glucose-Capillary: 114 mg/dL — ABNORMAL HIGH (ref 70–99)
Glucose-Capillary: 126 mg/dL — ABNORMAL HIGH (ref 70–99)

## 2020-08-20 LAB — FERRITIN: Ferritin: 689 ng/mL — ABNORMAL HIGH (ref 11–307)

## 2020-08-20 LAB — D-DIMER, QUANTITATIVE: D-Dimer, Quant: 1.11 ug/mL-FEU — ABNORMAL HIGH (ref 0.00–0.50)

## 2020-08-20 LAB — C-REACTIVE PROTEIN: CRP: 25.5 mg/dL — ABNORMAL HIGH (ref <1.0)

## 2020-08-20 LAB — MAGNESIUM: Magnesium: 1.9 mg/dL (ref 1.7–2.4)

## 2020-08-20 LAB — PHOSPHORUS: Phosphorus: 4 mg/dL (ref 2.5–4.6)

## 2020-08-20 LAB — HEMOGLOBIN A1C
Hgb A1c MFr Bld: 6 % — ABNORMAL HIGH (ref 4.8–5.6)
Mean Plasma Glucose: 125.5 mg/dL

## 2020-08-20 MED ORDER — VITAMIN D 25 MCG (1000 UNIT) PO TABS
1000.0000 [IU] | ORAL_TABLET | Freq: Every evening | ORAL | Status: DC
  Administered 2020-08-20: 1000 [IU] via ORAL
  Filled 2020-08-20: qty 1

## 2020-08-20 MED ORDER — VITAMIN D 125 MCG (5000 UT) PO CAPS: 5000.0000 [IU] | ORAL_CAPSULE | Freq: Every evening | ORAL | Status: DC

## 2020-08-20 MED ORDER — ASCORBIC ACID 500 MG PO TABS
1000.0000 mg | ORAL_TABLET | Freq: Every evening | ORAL | Status: DC
  Administered 2020-08-20: 1000 mg via ORAL
  Filled 2020-08-20: qty 2

## 2020-08-20 MED ORDER — INSULIN ASPART 100 UNIT/ML ~~LOC~~ SOLN
0.0000 [IU] | Freq: Three times a day (TID) | SUBCUTANEOUS | Status: DC
  Administered 2020-08-21: 1 [IU] via SUBCUTANEOUS
  Administered 2020-08-21: 2 [IU] via SUBCUTANEOUS
  Filled 2020-08-20: qty 0.09

## 2020-08-20 MED ORDER — VITAMIN B-12 1000 MCG PO TABS
2000.0000 ug | ORAL_TABLET | Freq: Every evening | ORAL | Status: DC
  Administered 2020-08-20: 2000 ug via ORAL
  Filled 2020-08-20 (×2): qty 2

## 2020-08-20 MED ORDER — TAMSULOSIN HCL 0.4 MG PO CAPS
0.4000 mg | ORAL_CAPSULE | Freq: Every day | ORAL | Status: DC
  Administered 2020-08-20: 0.4 mg via ORAL
  Filled 2020-08-20: qty 1

## 2020-08-20 MED ORDER — ARIPIPRAZOLE 10 MG PO TABS
5.0000 mg | ORAL_TABLET | Freq: Every day | ORAL | Status: DC
  Administered 2020-08-20: 5 mg via ORAL
  Filled 2020-08-20: qty 1

## 2020-08-20 MED ORDER — BENZPHETAMINE HCL 50 MG PO TABS: 50.0000 mg | ORAL_TABLET | Freq: Every day | ORAL | Status: DC

## 2020-08-20 MED ORDER — INSULIN ASPART 100 UNIT/ML ~~LOC~~ SOLN
0.0000 [IU] | Freq: Every day | SUBCUTANEOUS | Status: DC
  Filled 2020-08-20: qty 0.05

## 2020-08-20 MED ORDER — BUPROPION HCL ER (XL) 300 MG PO TB24
300.0000 mg | ORAL_TABLET | Freq: Every day | ORAL | Status: DC
  Administered 2020-08-20 – 2020-08-21 (×2): 300 mg via ORAL
  Filled 2020-08-20 (×2): qty 1

## 2020-08-20 NOTE — ED Notes (Signed)
Lunch tray delivered.

## 2020-08-20 NOTE — Progress Notes (Signed)
PROGRESS NOTE    Mary Stark  MOQ:[id number] DOB: 1956-11-16 DOA: 08/19/2020 PCP: [name], DO    Brief Narrative:  64 year old female with history of hypertension, hyperlipidemia, CKD stage IIIa, hepatitis C treated in 2013, COVID-19 vaccination taken in January 2021, recently developed bilateral hydronephrosis discharged with post stent on steroid therapy presented with fever and shortness of breath, poor oral intake for last 4 days. Grandchildren were sick at home, she started symptoms on 8/31 9/1, tested positive and he stayed home. In the emergency room, hemodynamically stable.  93% on room air.  85% on mobility.  Sodium 123.  CRP 19.  COVID-19 positive.  CTA negative for PE, bilateral groundglass opacities.  Treated with Decadron and remdesivir and admitted to the hospital.   Assessment & Plan:   Principal Problem:   Acute hypoxemic respiratory failure due to COVID-19 [facility]) Active Problems:   Dyslipidemia, goal LDL below 100   Depression with anxiety   Essential hypertension   Hyponatremia  Acute hypoxemic respiratory failure due to COVID-19 viral pneumonia, breakthrough infection in a vaccinated patient: Continue to monitor due to significant symptoms  chest physiotherapy, incentive spirometry, deep breathing exercises, sputum induction, mucolytic's and bronchodilators. Supplemental oxygen to keep saturations more than 85 %. Covid directed therapy with , steroids, on Solu-Medrol will continue remdesivir, day 2/5 actemra, not indicated.  Clinically stabilizing Due to severity of symptoms, patient will need daily inflammatory markers, chest x-rays, liver function test to monitor and direct COVID-19 therapies. If patient does adequate recovery, may discharge home tomorrow with out patient treatment .  COVID-19 Labs  Recent Labs    08/19/20 1548 08/20/20 0200  DDIMER 1.55* 1.11*  FERRITIN 841* 689*  LDH 266*  --   CRP 19.2* 25.5*    Lab Results   Component Value Date   SARSCOV2NAA NEGATIVE 07/02/2020   SARSCOV2NAA NEGATIVE 06/07/2020   [place] NEGATIVE 01/29/2020  Covid test positive on 08/19/20.   SpO2: 99 %  Hypovolemic hyponatremia: Due to poor oral intake and also on hydrochlorothiazide at home.  500 mL normal saline given.  Sodium level already improving.  Will encourage oral intake, regular diet.  Type 2 diabetes, controlled: On sitagliptin at home.  Holding.  On sliding scale insulin.  CKD stage IIIa: Chronic and stable.  Continue to monitor.  Depression/anxiety: Holding Abilify and Wellbutrin.  Sodium level normalized.  Will resume today.  DVT prophylaxis: enoxaparin (LOVENOX) injection 40 mg Start: 08/19/20 2200   Code Status: Full code Family Communication: None Disposition Plan: Status is: Inpatient  Remains inpatient appropriate because:Inpatient level of care appropriate due to severity of illness   Dispo: The patient is from: Home              Anticipated d/c is to: Home              Anticipated d/c date is: 2 days              Patient currently is not medically stable to d/c.         Consultants:   None  Procedures:   None  Antimicrobials:  Antibiotics Given (last 72 hours)    Date/Time Action Medication Dose Rate   08/19/20 1904 New Bag/Given   remdesivir 200 mg in sodium chloride 0.9% 250 mL IVPB 200 mg 580 mL/hr         Subjective: Patient seen and examined.  History in the ER.  She feels slightly better since yesterday.  Was able to eat  and drink today.  Has some dry cough.  Afebrile overnight. Mostly on room air, 1 to 2 L of oxygen on mobility.  Objective: Vitals:   08/20/20 0747 08/20/20 1039 08/20/20 1305 08/20/20 1322  BP: 139/84 131/74 129/83 134/69  Pulse: 67 67 72 71  Resp: 19 19 17 20   Temp: (!) 97.5 F (36.4 C) (!) 97.5 F (36.4 C)  (!) 97.5 F (36.4 C)  TempSrc: Oral Oral  Oral  SpO2: 93% 93% 95% 99%  Weight:      Height:        Intake/Output Summary  (Last 24 hours) at 08/20/2020 1414 Last data filed at 08/20/2020 0746 Gross per 24 hour  Intake 1025.42 ml  Output --  Net 1025.42 ml   Filed Weights   08/19/20 1440  Weight: 91.6 kg    Examination:  General exam: Appears calm and comfortable , not in any distress. Respiratory system: Clear to auscultation. Respiratory effort normal.  No added sounds. Cardiovascular system: S1 & S2 heard, RRR. No JVD, murmurs, rubs, gallops or clicks. No pedal edema. Gastrointestinal system: Abdomen is nondistended, soft and nontender. No organomegaly or masses felt. Normal bowel sounds heard. Central nervous system: Alert and oriented. No focal neurological deficits. Extremities: Symmetric 5 x 5 power. Skin: No rashes, lesions or ulcers Psychiatry: Judgement and insight appear normal. Mood & affect appropriate.     Data Reviewed: I have personally reviewed following labs and imaging studies  CBC: Recent Labs  Lab 08/19/20 1548 08/20/20 0200  WBC 13.4* 10.6*  NEUTROABS 11.3* 9.4*  HGB 12.1 11.0*  HCT 36.2 33.0*  MCV 89.6 91.4  PLT 292 834   Basic Metabolic Panel: Recent Labs  Lab 08/19/20 1548 08/20/20 0200  NA 123* 132*  K 3.5 3.9  CL 87* 94*  CO2 22 23  GLUCOSE 92 139*  BUN 17 15  CREATININE 1.13* 0.90  CALCIUM 8.2* 8.4*  MG  --  1.9  PHOS  --  4.0   GFR: Estimated Creatinine Clearance: 73.9 mL/min (by C-G formula based on SCr of 0.9 mg/dL). Liver Function Tests: Recent Labs  Lab 08/19/20 1548 08/20/20 0200  AST 33 28  ALT 21 19  ALKPHOS 81 75  BILITOT 0.3 0.3  PROT 7.5 6.9  ALBUMIN 2.9* 2.7*   No results for input(s): LIPASE, AMYLASE in the last 168 hours. No results for input(s): AMMONIA in the last 168 hours. Coagulation Profile: No results for input(s): INR, PROTIME in the last 168 hours. Cardiac Enzymes: No results for input(s): CKTOTAL, CKMB, CKMBINDEX, TROPONINI in the last 168 hours. BNP (last 3 results) No results for input(s): PROBNP in the last 8760  hours. HbA1C: No results for input(s): HGBA1C in the last 72 hours. CBG: No results for input(s): GLUCAP in the last 168 hours. Lipid Profile: Recent Labs    08/19/20 1548  TRIG 194*   Thyroid Function Tests: No results for input(s): TSH, T4TOTAL, FREET4, T3FREE, THYROIDAB in the last 72 hours. Anemia Panel: Recent Labs    08/19/20 1548 08/20/20 0200  FERRITIN 841* 689*   Sepsis Labs: Recent Labs  Lab 08/19/20 1548  PROCALCITON 0.36  LATICACIDVEN 1.2    Recent Results (from the past 240 hour(s))  Blood Culture (routine x 2)     Status: None (Preliminary result)   Collection Time: 08/19/20  3:48 PM   Specimen: Right Antecubital; Blood  Result Value Ref Range Status   Specimen Description   Final    RIGHT ANTECUBITAL Performed  at [facility], [place] [address], [place], [place] [address]    Special Requests   Final    BOTTLES DRAWN AEROBIC AND ANAEROBIC Blood Culture results may not be optimal due to an excessive volume of blood received in culture bottles Performed at [facility] [address], [place], [place] [address]    Culture   Final    NO GROWTH < 24 HOURS Performed at [facility] [address], [place], [place] [address]    Report Status PENDING  Incomplete  Blood Culture (routine x 2)     Status: None (Preliminary result)   Collection Time: 08/19/20  3:48 PM   Specimen: BLOOD LEFT FOREARM  Result Value Ref Range Status   Specimen Description   Final    BLOOD LEFT FOREARM Performed at [facility] [address] [place], [place] [address]    Special Requests   Final    BOTTLES DRAWN AEROBIC AND ANAEROBIC Blood Culture results may not be optimal due to an excessive volume of blood received in culture bottles Performed at [facility] [address] [place], [place] [address]    Culture   Final    NO GROWTH < 24 HOURS Performed at [facility]  [address], [place],  [address]    Report Status PENDING  Incomplete         Radiology Studies: CT Angio Chest PE W and/or Wo Contrast  Result Date: 08/19/2020 CLINICAL DATA:  Chest pain and shortness of breath. Tested positive for COVID-19 five days ago peer EXAM: CT ANGIOGRAPHY CHEST WITH CONTRAST TECHNIQUE: Multidetector CT imaging of the chest was performed using the standard protocol during bolus administration of intravenous contrast. Multiplanar CT image reconstructions and MIPs were obtained to evaluate the vascular anatomy. CONTRAST:  135mL OMNIPAQUE IOHEXOL 350 MG/ML SOLN COMPARISON:  Current chest radiograph. FINDINGS: Cardiovascular: There is satisfactory opacification of the pulmonary arteries to the segmental level. There is no evidence of a pulmonary embolism. Heart is normal in size and configuration. No pericardial effusion. No coronary artery calcifications. Great vessels are unremarkable. Mediastinum/Nodes: No enlarged mediastinal, hilar, or axillary lymph nodes. Thyroid gland, trachea, and esophagus demonstrate no significant findings. Lungs/Pleura: There are bilateral ground-glass lung opacities involving both lungs, all lobes. No evidence of pulmonary edema. No pleural effusion or pneumothorax. Upper Abdomen: No acute findings.  Partly imaged left renal stent. Musculoskeletal: No fracture or acute finding. No osteoblastic or osteolytic lesions. No chest wall masses. Review of the MIP images confirms the above findings. IMPRESSION: 1. No evidence of a pulmonary embolism. 2. Bilateral ground-glass lung opacities consistent with multifocal pneumonia, pattern compatible with COVID-19 infection. Electronically Signed   By: Lajean Manes M.D.   On: 08/19/2020 17:26   DG Chest Port 1 View  Result Date: 08/19/2020 CLINICAL DATA:  Shortness of breath and fever for the past week. Ex-smoker. EXAM: PORTABLE CHEST 1 VIEW COMPARISON:  None. FINDINGS: Normal sized heart. Small amount of linear  atelectasis or scarring in the left lower lung zone. Right lung. Minimal peribronchial thickening and prominence of the interstitial markings. Unremarkable bones. IMPRESSION: No acute abnormality.  Minimal chronic bronchitic changes. Electronically Signed   By: Claudie Revering M.D.   On: 08/19/2020 15:12        Scheduled Meds: . amLODipine  5 mg Oral QPM  . vitamin C  500 mg Oral Daily  . aspirin EC  81 mg Oral Daily  .  clonazePAM  0.5 mg Oral QHS  . enoxaparin (LOVENOX) injection  40 mg Subcutaneous Q24H  . gabapentin  300 mg Oral BID   And  . gabapentin  1,200 mg Oral QHS  . insulin aspart  0-5 Units Subcutaneous QHS  . insulin aspart  0-9 Units Subcutaneous TID WC  . methylPREDNISolone (SOLU-MEDROL) injection  0.5 mg/kg Intravenous Q12H   Followed by  . [START ON 08/23/2020] predniSONE  50 mg Oral Q breakfast  . nebivolol  20 mg Oral Daily  . simvastatin  20 mg Oral QHS  . zinc sulfate  220 mg Oral Daily   Continuous Infusions: . remdesivir 100 mg in NS 100 mL Stopped (08/20/20 1414)     LOS: 1 day    Time spent: 30 minutes    Barb Merino, MD [facility] Pager [phone]

## 2020-08-20 NOTE — ED Notes (Signed)
Pt placed on purewick 

## 2020-08-20 NOTE — Plan of Care (Signed)
  Problem: Safety: Goal: Ability to remain free from injury will improve Outcome: Progressing   Problem: Elimination: Goal: Will not experience complications related to bowel motility Outcome: Progressing   Problem: Nutrition: Goal: Adequate nutrition will be maintained Outcome: Progressing   Problem: Activity: Goal: Risk for activity intolerance will decrease Outcome: Progressing

## 2020-08-21 LAB — COMPREHENSIVE METABOLIC PANEL
ALT: 17 U/L (ref 0–44)
AST: 23 U/L (ref 15–41)
Albumin: 2.5 g/dL — ABNORMAL LOW (ref 3.5–5.0)
Alkaline Phosphatase: 61 U/L (ref 38–126)
Anion gap: 11 (ref 5–15)
BUN: 24 mg/dL — ABNORMAL HIGH (ref 8–23)
CO2: 22 mmol/L (ref 22–32)
Calcium: 8.3 mg/dL — ABNORMAL LOW (ref 8.9–10.3)
Chloride: 101 mmol/L (ref 98–111)
Creatinine, Ser: 0.83 mg/dL (ref 0.44–1.00)
GFR calc Af Amer: >60 mL/min (ref >60)
GFR calc non Af Amer: >60 mL/min (ref >60)
Glucose, Bld: 119 mg/dL — ABNORMAL HIGH (ref 70–99)
Potassium: 4 mmol/L (ref 3.5–5.1)
Sodium: 134 mmol/L — ABNORMAL LOW (ref 135–145)
Total Bilirubin: 0.3 mg/dL (ref 0.3–1.2)
Total Protein: 6.3 g/dL — ABNORMAL LOW (ref 6.5–8.1)

## 2020-08-21 LAB — CBC WITH DIFFERENTIAL/PLATELET
Abs Immature Granulocytes: 0.18 10*3/uL — ABNORMAL HIGH (ref 0.00–0.07)
Basophils Absolute: 0 10*3/uL (ref 0.0–0.1)
Basophils Relative: 0 %
Eosinophils Absolute: 0 10*3/uL (ref 0.0–0.5)
Eosinophils Relative: 0 %
HCT: 31.5 % — ABNORMAL LOW (ref 36.0–46.0)
Hemoglobin: 9.9 g/dL — ABNORMAL LOW (ref 12.0–15.0)
Immature Granulocytes: 1 %
Lymphocytes Relative: 8 %
Lymphs Abs: 1.1 10*3/uL (ref 0.7–4.0)
MCH: 30.1 pg (ref 26.0–34.0)
MCHC: 31.4 g/dL (ref 30.0–36.0)
MCV: 95.7 fL (ref 80.0–100.0)
Monocytes Absolute: 0.6 10*3/uL (ref 0.1–1.0)
Monocytes Relative: 4 %
Neutro Abs: 12.7 10*3/uL — ABNORMAL HIGH (ref 1.7–7.7)
Neutrophils Relative %: 87 %
Platelets: 317 10*3/uL (ref 150–400)
RBC: 3.29 MIL/uL — ABNORMAL LOW (ref 3.87–5.11)
RDW: 14.1 % (ref 11.5–15.5)
WBC: 14.6 10*3/uL — ABNORMAL HIGH (ref 4.0–10.5)
nRBC: 0 % (ref 0.0–0.2)

## 2020-08-21 LAB — MAGNESIUM: Magnesium: 1.8 mg/dL (ref 1.7–2.4)

## 2020-08-21 LAB — D-DIMER, QUANTITATIVE: D-Dimer, Quant: 0.79 ug/mL-FEU — ABNORMAL HIGH (ref 0.00–0.50)

## 2020-08-21 LAB — PHOSPHORUS: Phosphorus: 2.9 mg/dL (ref 2.5–4.6)

## 2020-08-21 LAB — FERRITIN: Ferritin: 672 ng/mL — ABNORMAL HIGH (ref 11–307)

## 2020-08-21 LAB — C-REACTIVE PROTEIN: CRP: 12.8 mg/dL — ABNORMAL HIGH (ref <1.0)

## 2020-08-21 LAB — GLUCOSE, CAPILLARY
Glucose-Capillary: 129 mg/dL — ABNORMAL HIGH (ref 70–99)
Glucose-Capillary: 192 mg/dL — ABNORMAL HIGH (ref 70–99)

## 2020-08-21 MED ORDER — BENZONATATE 100 MG PO CAPS
100.0000 mg | ORAL_CAPSULE | Freq: Once | ORAL | Status: AC
  Administered 2020-08-21: 100 mg via ORAL
  Filled 2020-08-21: qty 1

## 2020-08-21 MED ORDER — PREDNISONE 10 MG PO TABS: ORAL_TABLET | ORAL | 0 refills | Status: DC

## 2020-08-21 MED FILL — predniSONE 10 MG TABS: 10 | 8 days supply | Qty: 20 | Fill #0

## 2020-08-21 NOTE — Progress Notes (Signed)
Patient will be discharging at 3pm with family. O2 is being sent with her home. Patient's belongings were returned. Medications and information education was provided to the patient.

## 2020-08-21 NOTE — TOC Transition Note (Signed)
Transition of Care [facility]) - CM/SW Discharge Note   Patient Details  Name: Mary Stark MRN: [id number] Date of Birth: 05-12-1956  Transition of Care [facility]) CM/SW Contact:  Trish Mage, LCSW Phone Number: 08/21/2020, 11:15 AM   Clinical Narrative:   Patient who is discharging home today is in need of home O2.  SAT note and orders seen and appreciated.  Contacted Zach with [employer] who will arrange for travel cannister to be delivered to room for travel and home unit delivery.  Patient has ride home with daughter.  No further needs identified. TOC sign off.    Final next level of care: Home/Self Care Barriers to Discharge: No Barriers Identified   Patient Goals and CMS Choice        Discharge Placement                       Discharge Plan and Services                                     Social Determinants of Health (SDOH) Interventions     Readmission Risk Interventions No flowsheet data found.

## 2020-08-21 NOTE — Discharge Instructions (Addendum)
Patient scheduled for outpatient Remdesivir infusions at 9am on Thursday 9/9 and Friday 9/10 at [facility]. Please inform the patient to park at [place], as staff will be escorting the patient through the [place] entrance of the hospital.    There is a wave flag banner located near the entrance on [address]. Turn into this entrance and immediately turn left and park in 1 of the 5 designated Covid Infusion Parking spots. There is a phone number on the sign, please call and let the staff know what spot you are in and we will come out and get you. For questions call [phone].  Thanks.

## 2020-08-21 NOTE — Progress Notes (Signed)
Pt requesting tessalon pearl for dry cough. Offered PRN Robitussin or Tussionex, pt declined. On-call attending notified via [place], one time order to administer Craige [place]ta 100mg  given. Will continue to monitor the patient.

## 2020-08-21 NOTE — Care Management Important Message (Signed)
Important Message  Patient Details IM Letter given to the Patient Name: Mary Stark MRN: [id number] Date of Birth: [birth year]   Medicare Important Message Given:  Yes     Kerin Salen 08/21/2020, 11:41 AM

## 2020-08-21 NOTE — Discharge Summary (Signed)
Physician Discharge Summary  Mary Stark PHX:[id number] DOB: May 01, 1956 DOA: 08/19/2020  PCP: [name], DO  Admit date: 08/19/2020 Discharge date: 08/21/2020  Admitted From: Home Disposition: Home  Recommendations for Outpatient Follow-up:  1. Follow up with PCP in 1-2 weeks 2. Please obtain BMP/CBC in one week   Home Health: Not applicable Equipment/Devices: Oxygen  Discharge Condition: Stable CODE STATUS: Full code Diet recommendation: Regular diet, low-salt  Discharge summary: 64 year old female with history of hypertension, hyperlipidemia, CKD stage IIIa, treated hepatitis C, vaccinated against COVID-19 who recently developed bilateral hydronephrosis and has bilateral stents on maintenance steroids presented to the hospital with fever and shortness of breath, poor oral intake for last 4 days.  Patient was tested positive for COVID-19 on 9/1 and he stayed home.  In the emergency room hemodynamically stable.  Initially 93% on room air, 85% on mobility.  Sodium was 123.  CRP was 19.  COVID-19 was positive.  CTA negative for PE but that showed bilateral groundglass opacities.  Patient was hypoxic on presentation with hypovolemic hyponatremia and sodium 123 in the context of poor appetite and ongoing use of hydrochlorothiazide and SSRI.  She was admitted and treated with isotonic fluid.  For Covid she was treated with remdesivir day 3/5 today, she was treated with dexamethasone.  Today she has done good clinical recovery and her most of the symptoms have improved.  With improvement of symptoms and adequate support at home, she is going home today with outpatient infusion therapy scheduled for 9/9 and 9/10 to finish her remdesivir therapy, will change to oral prednisone and taper down to her home dose of 10 mg daily. Patient has fairly stable oxygen requirement at rest, however on mobility her oxygenation dropped to 82% and required 2 L to correct oxygen requirement.  She is prescribed  home oxygen.  Patient is on multiple other medications that she can continue.  Because of significant hyponatremia, will recommend to discontinue hydrochlorothiazide.  Discharge Diagnoses:  Principal Problem:   Acute hypoxemic respiratory failure due to COVID-19 [facility]) Active Problems:   Dyslipidemia, goal LDL below 100   Depression with anxiety   Essential hypertension   Hyponatremia    Discharge Instructions  Discharge Instructions    Call MD for:  difficulty breathing, headache or visual disturbances   Complete by: As directed    Call MD for:  extreme fatigue   Complete by: As directed    Call MD for:  persistant dizziness or light-headedness   Complete by: As directed    Call MD for:  temperature >100.4   Complete by: As directed    Diet - low sodium heart healthy   Complete by: As directed    Diet Carb Modified   Complete by: As directed    Discharge instructions   Complete by: As directed    Can use over the counter cough medicine and tylenol Come back to finish remdesivir therapy   Increase activity slowly   Complete by: As directed      Allergies as of 08/21/2020      Reactions   Metformin And Related Other (See Comments)   Lactic acidosis    Celebrex [celecoxib] Other (See Comments)   Dizziness, "makes me feel bad"   Penicillins Hives, Rash   Has patient had a PCN reaction causing immediate rash, facial/tongue/throat swelling, SOB or lightheadedness with hypotension: Yes Has patient had a PCN reaction causing severe rash involving mucus membranes or skin necrosis:No Has patient had a PCN reaction  that required hospitalization: Yes Has patient had a PCN reaction occurring within the last 10 years: No If all of the above answers are "NO", then may proceed with Cephalosporin use.      Medication List    STOP taking these medications   hydrochlorothiazide 25 MG tablet Commonly known as: HYDRODIURIL     TAKE these medications   acetaminophen 500 MG  tablet Commonly known as: TYLENOL Take 1,000 mg by mouth every 8 (eight) hours as needed for moderate pain.   amLODipine 5 MG tablet Commonly known as: NORVASC TAKE 1 TABLET BY MOUTH ONCE DAILY What changed: when to take this   ARIPiprazole 5 MG tablet Commonly known as: ABILIFY Take 5 mg by mouth at bedtime.   ascorbic acid 500 MG tablet Commonly known as: VITAMIN C Take 1,000 mg by mouth every evening.   Aspirin Low Dose 81 MG EC tablet Generic drug: aspirin TAKE 1 TABLET BY MOUTH DAILY. What changed: how much to take   Benzphetamine HCl 50 MG Tabs Take 50 mg by mouth daily.   buPROPion 300 MG 24 hr tablet Commonly known as: WELLBUTRIN XL Take 1 tablet (300 mg total) by mouth daily.   Bystolic 20 MG Tabs Generic drug: Nebivolol HCl TAKE 1 TABLET (20 MG TOTAL) BY MOUTH DAILY. What changed: See the new instructions.   clonazePAM 0.5 MG tablet Commonly known as: KLONOPIN Take 0.5 mg by mouth at bedtime.   Co Q-10 200 MG Caps Take 200 mg by mouth every evening.   fexofenadine 180 MG tablet Commonly known as: ALLEGRA Take 180 mg by mouth every evening.   Fish Oil 1200 MG Caps Take 1,200 mg by mouth every evening.   gabapentin 300 MG capsule Commonly known as: Neurontin 1  qam   4   qhs What changed:   how much to take  how to take this  when to take this  additional instructions   GLUCOSAMINE-CHONDROITIN PO Take 2 tablets by mouth every evening.   omeprazole 20 MG capsule Commonly known as: PRILOSEC Take 1 capsule (20 mg total) by mouth daily. What changed: when to take this   ondansetron 4 MG disintegrating tablet Commonly known as: ZOFRAN-ODT Take 1 tablet (4 mg total) by mouth every 8 (eight) hours as needed for nausea or vomiting.   phentermine 37.5 MG tablet Commonly known as: ADIPEX-P Take 75 mg by mouth daily.   predniSONE 10 MG tablet Commonly known as: DELTASONE 4 tabs for 2 days 3 tabs for 2 days 2 tabs for 2 days 1 tab for 2 days  and continue your current therapy   ProAir HFA 108 (90 Base) MCG/ACT inhaler Generic drug: albuterol INHALE 1 TO 2 PUFFS BY MOUTH INTO THE LUNGS EVERY 4 HOURS AS NEEDED FOR WHEEZING OR SHORTNESS OF BREATH What changed: See the new instructions.   Saxenda 18 MG/3ML Sopn Generic drug: Liraglutide -Weight Management Inject 3 mg into the skin daily.   simvastatin 20 MG tablet Commonly known as: ZOCOR TAKE 1 TABLET (20 MG TOTAL) BY MOUTH DAILY. What changed: See the new instructions.   tamsulosin 0.4 MG Caps capsule Commonly known as: FLOMAX Take 0.4 mg by mouth at bedtime.   tolterodine 4 MG 24 hr capsule Commonly known as: DETROL LA Take 4 mg by mouth daily.   vitamin B-12 1000 MCG tablet Commonly known as: CYANOCOBALAMIN Take 2,000 mcg by mouth every evening.   Vitamin D 125 MCG (5000 UT) Caps Take 5,000 Units by mouth  every evening.   Vitamin E 180 MG (400 UNIT) Caps Take 400 Units by mouth every evening.            Durable Medical Equipment  (From admission, onward)         Start     Ordered   08/21/20 1111  For home use only DME oxygen  Once       Comments: Patient Saturations on Room Air at Rest = 87%  Patient Saturations on Room Air while Ambulating = 82%  Patient Saturations on 2 Liters of oxygen while Ambulating = 92%  Please briefly explain why patient needs home oxygen: Patient desats while ambulating or with exertion.  Question Answer Comment  Length of Need 6 Months   Mode or (Route) Nasal cannula   Liters per Minute 2   Frequency Continuous (stationary and portable oxygen unit needed)   Oxygen conserving device Yes   Oxygen delivery system Gas      08/21/20 1110          Allergies  Allergen Reactions  . Metformin And Related Other (See Comments)    Lactic acidosis   . Celebrex [Celecoxib] Other (See Comments)    Dizziness, "makes me feel bad"  . Penicillins Hives and Rash    Has patient had a PCN reaction causing immediate rash,  facial/tongue/throat swelling, SOB or lightheadedness with hypotension: Yes Has patient had a PCN reaction causing severe rash involving mucus membranes or skin necrosis:No Has patient had a PCN reaction that required hospitalization: Yes Has patient had a PCN reaction occurring within the last 10 years: No If all of the above answers are "NO", then may proceed with Cephalosporin use.     Consultations:  None   Procedures/Studies: CT Angio Chest PE W and/or Wo Contrast  Result Date: 08/19/2020 CLINICAL DATA:  Chest pain and shortness of breath. Tested positive for COVID-19 five days ago peer EXAM: CT ANGIOGRAPHY CHEST WITH CONTRAST TECHNIQUE: Multidetector CT imaging of the chest was performed using the standard protocol during bolus administration of intravenous contrast. Multiplanar CT image reconstructions and MIPs were obtained to evaluate the vascular anatomy. CONTRAST:  168mL OMNIPAQUE IOHEXOL 350 MG/ML SOLN COMPARISON:  Current chest radiograph. FINDINGS: Cardiovascular: There is satisfactory opacification of the pulmonary arteries to the segmental level. There is no evidence of a pulmonary embolism. Heart is normal in size and configuration. No pericardial effusion. No coronary artery calcifications. Great vessels are unremarkable. Mediastinum/Nodes: No enlarged mediastinal, hilar, or axillary lymph nodes. Thyroid gland, trachea, and esophagus demonstrate no significant findings. Lungs/Pleura: There are bilateral ground-glass lung opacities involving both lungs, all lobes. No evidence of pulmonary edema. No pleural effusion or pneumothorax. Upper Abdomen: No acute findings.  Partly imaged left renal stent. Musculoskeletal: No fracture or acute finding. No osteoblastic or osteolytic lesions. No chest wall masses. Review of the MIP images confirms the above findings. IMPRESSION: 1. No evidence of a pulmonary embolism. 2. Bilateral ground-glass lung opacities consistent with multifocal pneumonia,  pattern compatible with COVID-19 infection. Electronically Signed   By: Lajean Manes M.D.   On: 08/19/2020 17:26   DG Chest Port 1 View  Result Date: 08/19/2020 CLINICAL DATA:  Shortness of breath and fever for the past week. Ex-smoker. EXAM: PORTABLE CHEST 1 VIEW COMPARISON:  None. FINDINGS: Normal sized heart. Small amount of linear atelectasis or scarring in the left lower lung zone. Right lung. Minimal peribronchial thickening and prominence of the interstitial markings. Unremarkable bones. IMPRESSION: No acute abnormality.  Minimal  chronic bronchitic changes. Electronically Signed   By: Claudie Revering M.D.   On: 08/19/2020 15:12   (Echo, Carotid, EGD, Colonoscopy, ERCP)    Subjective: Patient seen and examined.  No overnight events.  She is up about and ready to go home.  She can manage her oxygen at home and will drive back to the hospital for remdesivir therapy tomorrow.  No more nausea or vomiting.   Discharge Exam: Vitals:   08/21/20 0000 08/21/20 0400  BP: (!) 109/97 123/76  Pulse: 64 (!) 59  Resp: 18 18  Temp: 97.7 F (36.5 C) 97.6 F (36.4 C)  SpO2: 92% 91%   Vitals:   08/20/20 1608 08/20/20 1955 08/21/20 0000 08/21/20 0400  BP: (!) 150/92 136/85 (!) 109/97 123/76  Pulse: 63 62 64 (!) 59  Resp: 20 18 18 18   Temp: (!) 97.4 F (36.3 C) 98.6 F (37 C) 97.7 F (36.5 C) 97.6 F (36.4 C)  TempSrc: Oral  Oral Oral  SpO2: 92% 91% 92% 91%  Weight:      Height:        General: Pt is alert, awake, not in acute distress Cardiovascular: RRR, S1/S2 +, no rubs, no gallops Respiratory: CTA bilaterally, no wheezing, no rhonchi No added sounds.  She is currently on 2 L of oxygen. Abdominal: Soft, NT, ND, bowel sounds + Extremities: no edema, no cyanosis    The results of significant diagnostics from this hospitalization (including imaging, microbiology, ancillary and laboratory) are listed below for reference.     Microbiology: Recent Results (from the past 240 hour(s))   Blood Culture (routine x 2)     Status: None (Preliminary result)   Collection Time: 08/19/20  3:48 PM   Specimen: Right Antecubital; Blood  Result Value Ref Range Status   Specimen Description   Final    RIGHT ANTECUBITAL Performed at [facility] [address], [place], [place] [address]    Special Requests   Final    BOTTLES DRAWN AEROBIC AND ANAEROBIC Blood Culture results may not be optimal due to an excessive volume of blood received in culture bottles Performed at [facility] [address] [place], [place] [address]    Culture   Final    NO GROWTH 2 DAYS Performed at [facility] [address] [place], [place] [address]    Report Status PENDING  Incomplete  Blood Culture (routine x 2)     Status: None (Preliminary result)   Collection Time: 08/19/20  3:48 PM   Specimen: BLOOD LEFT FOREARM  Result Value Ref Range Status   Specimen Description   Final    BLOOD LEFT FOREARM Performed at [facility] [address], [place], [place] [address]    Special Requests   Final    BOTTLES DRAWN AEROBIC AND ANAEROBIC Blood Culture results may not be optimal due to an excessive volume of blood received in culture bottles Performed at [facility] [address], [place], [place] [address]    Culture   Final    NO GROWTH 2 DAYS Performed at [facility] [address], [place], [place] [address]    Report Status PENDING  Incomplete     Labs: BNP (last 3 results) No results for input(s): BNP in the last 8760 hours. Basic Metabolic Panel: Recent Labs  Lab 08/19/20 1548 08/20/20 0200 08/21/20 0401  NA 123* 132* 134*  K 3.5 3.9 4.0  CL  87* 94* 101  CO2 22 23 22   GLUCOSE 92 139* 119*  BUN 17 15 24*  CREATININE 1.13* 0.90 0.83  CALCIUM 8.2* 8.4* 8.3*  MG  --  1.9 1.8  PHOS  --  4.0 2.9   Liver Function Tests: Recent Labs  Lab 08/19/20 1548 08/20/20 0200 08/21/20 0401   AST 33 28 23  ALT 21 19 17   ALKPHOS 81 75 61  BILITOT 0.3 0.3 0.3  PROT 7.5 6.9 6.3*  ALBUMIN 2.9* 2.7* 2.5*   No results for input(s): LIPASE, AMYLASE in the last 168 hours. No results for input(s): AMMONIA in the last 168 hours. CBC: Recent Labs  Lab 08/19/20 1548 08/20/20 0200 08/21/20 0401  WBC 13.4* 10.6* 14.6*  NEUTROABS 11.3* 9.4* 12.7*  HGB 12.1 11.0* 9.9*  HCT 36.2 33.0* 31.5*  MCV 89.6 91.4 95.7  PLT 292 300 317   Cardiac Enzymes: No results for input(s): CKTOTAL, CKMB, CKMBINDEX, TROPONINI in the last 168 hours. BNP: Invalid input(s): POCBNP CBG: Recent Labs  Lab 08/20/20 1619 08/20/20 2119 08/21/20 0731 08/21/20 1321  GLUCAP 114* 126* 129* 192*   D-Dimer Recent Labs    08/20/20 0200 08/21/20 0401  DDIMER 1.11* 0.79*   Hgb A1c Recent Labs    08/20/20 0200  HGBA1C 6.0*   Lipid Profile Recent Labs    08/19/20 1548  TRIG 194*   Thyroid function studies No results for input(s): TSH, T4TOTAL, T3FREE, THYROIDAB in the last 72 hours.  Invalid input(s): FREET3 Anemia work up Recent Labs    08/20/20 0200 08/21/20 0401  FERRITIN 689* 672*   Urinalysis    Component Value Date/Time   COLORURINE YELLOW 08/19/2020 1843   APPEARANCEUR CLEAR 08/19/2020 1843   LABSPEC 1.016 08/19/2020 1843   PHURINE 5.0 08/19/2020 1843   GLUCOSEU NEGATIVE 08/19/2020 1843   HGBUR LARGE (A) 08/19/2020 1843   BILIRUBINUR NEGATIVE 08/19/2020 1843   BILIRUBINUR negative 06/03/2020 1359   KETONESUR NEGATIVE 08/19/2020 1843   PROTEINUR 100 (A) 08/19/2020 1843   UROBILINOGEN 0.2 06/03/2020 1359   NITRITE NEGATIVE 08/19/2020 1843   LEUKOCYTESUR LARGE (A) 08/19/2020 1843   Sepsis Labs Invalid input(s): PROCALCITONIN,  WBC,  LACTICIDVEN Microbiology Recent Results (from the past 240 hour(s))  Blood Culture (routine x 2)     Status: None (Preliminary result)   Collection Time: 08/19/20  3:48 PM   Specimen: Right Antecubital; Blood  Result Value Ref Range Status    Specimen Description   Final    RIGHT ANTECUBITAL Performed at [facility] [place] [address], [place], [place] [address]    Special Requests   Final    BOTTLES DRAWN AEROBIC AND ANAEROBIC Blood Culture results may not be optimal due to an excessive volume of blood received in culture bottles Performed at [facility] [address], [place], [place] [address]    Culture   Final    NO GROWTH 2 DAYS Performed at [facility] [address] [place], [place] [address]    Report Status PENDING  Incomplete  Blood Culture (routine x 2)     Status: None (Preliminary result)   Collection Time: 08/19/20  3:48 PM   Specimen: BLOOD LEFT FOREARM  Result Value Ref Range Status   Specimen Description   Final    BLOOD LEFT FOREARM Performed at [facility] [address], [place], [place] [address]    Special Requests   Final    BOTTLES DRAWN AEROBIC AND  ANAEROBIC Blood Culture results may not be optimal due to an excessive volume of blood received in culture bottles Performed at [facility] [address] [place], [place] [address]    Culture   Final    NO GROWTH 2 DAYS Performed at [facility] [address] [place], [place] [address]    Report Status PENDING  Incomplete     Time coordinating discharge: 32 minutes  SIGNED:   Barb Merino, MD  [facility] 08/21/2020, 1:27 PM

## 2020-08-21 NOTE — Progress Notes (Signed)
SATURATION QUALIFICATIONS: (This note is used to comply with regulatory documentation for home oxygen)  Patient Saturations on Room Air at Rest = 87%  Patient Saturations on Room Air while Ambulating = 82%  Patient Saturations on 2 Liters of oxygen while Ambulating = 92%  Please briefly explain why patient needs home oxygen: Patient desats while ambulating or with exertion.

## 2020-08-21 NOTE — Progress Notes (Signed)
Patient scheduled for outpatient Remdesivir infusions at 9am on Thursday 9/9 and Friday 9/10 at [facility]. Please inform the patient to park at [facility], as staff will be escorting the patient through the [place] entrance of the hospital.    There is a wave flag banner located near the entrance on [address]. Turn into this entrance and immediately turn left and park in 1 of the 5 designated Covid Infusion Parking spots. There is a phone number on the sign, please call and let the staff know what spot you are in and we will come out and get you. For questions call [phone].  Thanks.

## 2020-08-22 ENCOUNTER — Telehealth: Payer: Self-pay

## 2020-08-22 ENCOUNTER — Ambulatory Visit (HOSPITAL_COMMUNITY)
Admit: 2020-08-22 | Discharge: 2020-08-22 | Disposition: A | Discharge status: Home / Self Care | Payer: No Typology Code available for payment source | Attending: Pulmonary Disease | Admitting: Pulmonary Disease

## 2020-08-22 ENCOUNTER — Other Ambulatory Visit: Payer: Self-pay | Admitting: *Deleted

## 2020-08-22 ENCOUNTER — Encounter: Payer: Self-pay | Admitting: *Deleted

## 2020-08-22 DIAGNOSIS — J1282 Pneumonia due to coronavirus disease 2019: Secondary | ICD-10-CM | POA: Diagnosis not present

## 2020-08-22 DIAGNOSIS — U071 COVID-19: Secondary | ICD-10-CM | POA: Diagnosis not present

## 2020-08-22 MED ORDER — FAMOTIDINE IN NACL 20-0.9 MG/50ML-% IV SOLN: 20.0000 mg | Freq: Once | INTRAVENOUS | Status: DC | PRN

## 2020-08-22 MED ORDER — SODIUM CHLORIDE 0.9 % IV SOLN
100.0000 mg | Freq: Once | INTRAVENOUS | Status: AC
  Administered 2020-08-22: 100 mg via INTRAVENOUS
  Filled 2020-08-22: qty 20

## 2020-08-22 MED ORDER — EPINEPHRINE 0.3 MG/0.3ML IJ SOAJ: 0.3000 mg | Freq: Once | INTRAMUSCULAR | Status: DC | PRN

## 2020-08-22 MED ORDER — ALBUTEROL SULFATE HFA 108 (90 BASE) MCG/ACT IN AERS: 2.0000 | INHALATION_SPRAY | Freq: Once | RESPIRATORY_TRACT | Status: DC | PRN

## 2020-08-22 MED ORDER — SODIUM CHLORIDE 0.9 % IV SOLN: INTRAVENOUS | Status: DC | PRN

## 2020-08-22 MED ORDER — METHYLPREDNISOLONE SODIUM SUCC 125 MG IJ SOLR: 125.0000 mg | Freq: Once | INTRAMUSCULAR | Status: DC | PRN

## 2020-08-22 MED ORDER — DIPHENHYDRAMINE HCL 50 MG/ML IJ SOLN: 50.0000 mg | Freq: Once | INTRAMUSCULAR | Status: DC | PRN

## 2020-08-22 NOTE — Patient Outreach (Addendum)
[facility]) Care Management  08/22/2020  Mary Stark 05/04/1956 [id number]   Transition of care call/case closure   Referral received:08/20/20 Initial outreach:08/22/20 Insurance: UMR    Subjective: Initial successful telephone call to patient's preferred number in order to complete transition of care assessment; 2 HIPAA identifiers verified. Explained purpose of call and completed transition of care assessment.  States she is doing okay feeling washed out. She reports continues with occasional cough, denies fever, or worsening shortness of breath, dizziness. She continues to wear oxygen at 2.5 liters with oxygen saturation at 88-92%. She discussed taste is a weird everything taste like vanilla. Reinforced importance of staying hydrated , nutrition as tolerated, small meals. She denies having diarrhea.   Patient daughter/family  are assisting with her recovery. 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 She understands she will not be returning to work as soon as she thought since she  is on oxygen.  Discussed ongoing health issues and says she  does reports that she is active with  [facility] chronic disease management programs. She discussed continuing to monitor blood pressure and it is doing good.  She does not have the hospital indemnity, she has made contact with [employer] regarding FMLA, encouraged to contact benefits office if other questions states she has the contact number.  She  uses a [facility] outpatient pharmacy at [facility] outpatient pharmacy.  She denies educational needs related to staying safe during the COVID 19 pandemic. She is being followed by health at work   Objective:  [name]  was hospitalized at [facility]  From 9/6-08/21/20 for Acute Respiratory failure, Covid 19 Pneumonia Comorbidities include: Hypertension, hyperlipidemia, stage 3 CKD, treated hepatitis, hx of  bilateral hydronephrosis and bilateral stents .  She  was discharged to home on 08/21/20 without the need for home health services , discharged home with home oxygen .   Assessment:  Patient voices good understanding of all discharge instructions.  See transition of care flowsheet for assessment details.   Plan:  Reviewed hospital discharge diagnosis of Covid 19 pneumonia   and discharge treatment plan using hospital discharge instructions, assessing medication adherence, reviewing problems requiring provider notification, and discussing the importance of follow up with surgeon, primary care provider and/or specialists as directed.  Reviewed  healthy lifestyle program information to receive discounted premium for  2022   Step 1: Get  your annual physical  Step 2: Complete your health assessment  Step 3:Identify your current health status and complete the corresponding action step between January 1, and August 14, 2020.  She reports that she has completed .   Using [facility] website, verified that patient is an active participate in 's [employer] Management chronic disease management program.    No ongoing care management needs identified so will close case to [facility] services and route successful outreach letter with [facility] Management pamphlet and 24 Hour Nurse Line Magnet to [facility] clinical pool to be mailed to patient's home address.  Thanked patient for their services to [facility].   Joylene Draft, RN, BSN  [facility] Management Coordinator  [phone]- Mobile [phone]- Toll Free Main Office

## 2020-08-22 NOTE — Telephone Encounter (Signed)
Reviewed discharge summary  Would continue with meds as Rx on discharge, keep current appt w/ me unless feeling worse!

## 2020-08-22 NOTE — Discharge Instructions (Signed)
110 Things You Can Do to Manage Your COVID-19 Symptoms at Home If you have possible or confirmed COVID-19: 1. Stay home from work and school. And stay away from other public places. If you must go out, avoid using any kind of public transportation, ridesharing, or taxis. 2. Monitor your symptoms carefully. If your symptoms get worse, call your healthcare provider immediately. 3. Get rest and stay hydrated. 4. If you have a medical appointment, call the healthcare provider ahead of time and tell them that you have or may have COVID-19. 5. For medical emergencies, call 911 and notify the dispatch personnel that you have or may have COVID-19. 6. Cover your cough and sneezes with a tissue or use the inside of your elbow. 7. Wash your hands often with soap and water for at least 20 seconds or clean your hands with an alcohol-based hand sanitizer that contains at least 60% alcohol. 8. As much as possible, stay in a specific room and away from other people in your home. Also, you should use a separate bathroom, if available. If you need to be around other people in or outside of the home, wear a mask. 9. Avoid sharing personal items with other people in your household, like dishes, towels, and bedding. 10. Clean all surfaces that are touched often, like counters, tabletops, and doorknobs. Use household cleaning sprays or wipes according to the label instructions. [email or web address] 06/14/2019 This information is not intended to replace advice given to you by your health care provider. Make sure you discuss any questions you have with your health care provider. Document Revised: 11/16/2019 Document Reviewed: 11/16/2019 Elsevier Patient Education  [place].

## 2020-08-22 NOTE — Progress Notes (Signed)
  Diagnosis: COVID-19  Physician: Dr. Joya Gaskins  Procedure: Covid Infusion Clinic Med: remdesivir infusion - Provided patient with remdesivir fact sheet for patients, parents and caregivers prior to infusion.  Complications: No immediate complications noted.  Discharge: Discharged home   Mary Stark 08/22/2020

## 2020-08-22 NOTE — Telephone Encounter (Signed)
Patient called wanting Dr Sheppard Coil to review her recent hospitalization notes and advise on when she needs to follow up and what she needs to do.   She was admitted 08/19/20 for COVID positive and COVID complications.   Patient was sent home on home oxygen and wanting to make sure that she does not need to be monitored or seen in person sooner than her hospital follow up (scheudled 08/28/20).

## 2020-08-22 NOTE — Progress Notes (Signed)
  Diagnosis: COVID-19  Physician:Dr wright  Procedure: [facility] Med: remdesivir infusion - Provided patient with remdesivir fact sheet for patients, parents and caregivers prior to infusion.  Complications: No immediate complications noted.  Discharge: Discharged home   [place], [place] 08/22/2020

## 2020-08-23 ENCOUNTER — Ambulatory Visit (HOSPITAL_COMMUNITY)
Admit: 2020-08-23 | Discharge: 2020-08-23 | Disposition: A | Discharge status: Home / Self Care | Payer: No Typology Code available for payment source | Attending: Pulmonary Disease | Admitting: Pulmonary Disease

## 2020-08-23 DIAGNOSIS — U071 COVID-19: Secondary | ICD-10-CM | POA: Diagnosis not present

## 2020-08-23 MED ORDER — DIPHENHYDRAMINE HCL 50 MG/ML IJ SOLN: 50.0000 mg | Freq: Once | INTRAMUSCULAR | Status: DC | PRN

## 2020-08-23 MED ORDER — METHYLPREDNISOLONE SODIUM SUCC 125 MG IJ SOLR: 125.0000 mg | Freq: Once | INTRAMUSCULAR | Status: DC | PRN

## 2020-08-23 MED ORDER — ALBUTEROL SULFATE HFA 108 (90 BASE) MCG/ACT IN AERS: 2.0000 | INHALATION_SPRAY | Freq: Once | RESPIRATORY_TRACT | Status: DC | PRN

## 2020-08-23 MED ORDER — EPINEPHRINE 0.3 MG/0.3ML IJ SOAJ: 0.3000 mg | Freq: Once | INTRAMUSCULAR | Status: DC | PRN

## 2020-08-23 MED ORDER — SODIUM CHLORIDE 0.9 % IV SOLN: INTRAVENOUS | Status: DC | PRN

## 2020-08-23 MED ORDER — SODIUM CHLORIDE 0.9 % IV SOLN
100.0000 mg | Freq: Once | INTRAVENOUS | Status: AC
  Administered 2020-08-23: 100 mg via INTRAVENOUS
  Filled 2020-08-23: qty 20

## 2020-08-23 MED ORDER — FAMOTIDINE IN NACL 20-0.9 MG/50ML-% IV SOLN: 20.0000 mg | Freq: Once | INTRAVENOUS | Status: DC | PRN

## 2020-08-23 NOTE — Telephone Encounter (Signed)
Patient contacted, advised patient to continue with discharge meds and keep scheduled appointment. Patient voiced her understanding.

## 2020-08-23 NOTE — Discharge Instructions (Signed)
COVID-19 COVID-19 is a respiratory infection that is caused by a virus called severe acute respiratory syndrome coronavirus 2 (SARS-CoV-2). The disease is also known as coronavirus disease or novel coronavirus. In some people, the virus may not cause any symptoms. In others, it may cause a serious infection. The infection can get worse quickly and can lead to complications, such as:  Pneumonia, or infection of the lungs.  Acute respiratory distress syndrome or ARDS. This is a condition in which fluid build-up in the lungs prevents the lungs from filling with air and passing oxygen into the blood.  Acute respiratory failure. This is a condition in which there is not enough oxygen passing from the lungs to the body or when carbon dioxide is not passing from the lungs out of the body.  Sepsis or septic shock. This is a serious bodily reaction to an infection.  Blood clotting problems.  Secondary infections due to bacteria or fungus.  Organ failure. This is when your body's organs stop working. The virus that causes COVID-19 is contagious. This means that it can spread from person to person through droplets from coughs and sneezes (respiratory secretions). What are the causes? This illness is caused by a virus. You may catch the virus by:  Breathing in droplets from an infected person. Droplets can be spread by a person breathing, speaking, singing, coughing, or sneezing.  Touching something, like a table or a doorknob, that was exposed to the virus (contaminated) and then touching your mouth, nose, or eyes. What increases the risk? Risk for infection You are more likely to be infected with this virus if you:  Are within 6 feet (2 meters) of a person with COVID-19.  Provide care for or live with a person who is infected with COVID-19.  Spend time in crowded indoor spaces or live in shared housing. Risk for serious illness You are more likely to become seriously ill from the virus if you:   Are 50 years of age or older. The higher your age, the more you are at risk for serious illness.  Live in a nursing home or long-term care facility.  Have cancer.  Have a long-term (chronic) disease such as: ? Chronic lung disease, including chronic obstructive pulmonary disease or asthma. ? A long-term disease that lowers your body's ability to fight infection (immunocompromised). ? Heart disease, including heart failure, a condition in which the arteries that lead to the heart become narrow or blocked (coronary artery disease), a disease which makes the heart muscle thick, weak, or stiff (cardiomyopathy). ? Diabetes. ? Chronic kidney disease. ? Sickle cell disease, a condition in which red blood cells have an abnormal "sickle" shape. ? Liver disease.  Are obese. What are the signs or symptoms? Symptoms of this condition can range from mild to severe. Symptoms may appear any time from 2 to 14 days after being exposed to the virus. They include:  A fever or chills.  A cough.  Difficulty breathing.  Headaches, body aches, or muscle aches.  Runny or stuffy (congested) nose.  A sore throat.  New loss of taste or smell. Some people may also have stomach problems, such as nausea, vomiting, or diarrhea. Other people may not have any symptoms of COVID-19. How is this diagnosed? This condition may be diagnosed based on:  Your signs and symptoms, especially if: ? You live in an area with a COVID-19 outbreak. ? You recently traveled to or from an area where the virus is common. ? You   provide care for or live with a person who was diagnosed with COVID-19. ? You were exposed to a person who was diagnosed with COVID-19.  A physical exam.  Lab tests, which may include: ? Taking a sample of fluid from the back of your nose and throat (nasopharyngeal fluid), your nose, or your throat using a swab. ? A sample of mucus from your lungs (sputum). ? Blood tests.  Imaging tests, which  may include, X-rays, CT scan, or ultrasound. How is this treated? At present, there is no medicine to treat COVID-19. Medicines that treat other diseases are being used on a trial basis to see if they are effective against COVID-19. Your health care provider will talk with you about ways to treat your symptoms. For most people, the infection is mild and can be managed at home with rest, fluids, and over-the-counter medicines. Treatment for a serious infection usually takes places in a hospital intensive care unit (ICU). It may include one or more of the following treatments. These treatments are given until your symptoms improve.  Receiving fluids and medicines through an IV.  Supplemental oxygen. Extra oxygen is given through a tube in the nose, a face mask, or a hood.  Positioning you to lie on your stomach (prone position). This makes it easier for oxygen to get into the lungs.  Continuous positive airway pressure (CPAP) or bi-level positive airway pressure (BPAP) machine. This treatment uses mild air pressure to keep the airways open. A tube that is connected to a motor delivers oxygen to the body.  Ventilator. This treatment moves air into and out of the lungs by using a tube that is placed in your windpipe.  Tracheostomy. This is a procedure to create a hole in the neck so that a breathing tube can be inserted.  Extracorporeal membrane oxygenation (ECMO). This procedure gives the lungs a chance to recover by taking over the functions of the heart and lungs. It supplies oxygen to the body and removes carbon dioxide. Follow these instructions at home: Lifestyle  If you are sick, stay home except to get medical care. Your health care provider will tell you how long to stay home. Call your health care provider before you go for medical care.  Rest at home as told by your health care provider.  Do not use any products that contain nicotine or tobacco, such as cigarettes, e-cigarettes, and  chewing tobacco. If you need help quitting, ask your health care provider.  Return to your normal activities as told by your health care provider. Ask your health care provider what activities are safe for you. General instructions  Take over-the-counter and prescription medicines only as told by your health care provider.  Drink enough fluid to keep your urine pale yellow.  Keep all follow-up visits as told by your health care provider. This is important. How is this prevented?  There is no vaccine to help prevent COVID-19 infection. However, there are steps you can take to protect yourself and others from this virus. To protect yourself:   Do not travel to areas where COVID-19 is a risk. The areas where COVID-19 is reported change often. To identify high-risk areas and travel restrictions, check the CDC travel website: wwwnc.cdc.gov/travel/notices  If you live in, or must travel to, an area where COVID-19 is a risk, take precautions to avoid infection. ? Stay away from people who are sick. ? Wash your hands often with soap and water for 20 seconds. If soap and water   are not available, use an alcohol-based hand sanitizer. ? Avoid touching your mouth, face, eyes, or nose. ? Avoid going out in public, follow guidance from your state and local health authorities. ? If you must go out in public, wear a cloth face covering or face mask. Make sure your mask covers your nose and mouth. ? Avoid crowded indoor spaces. Stay at least 6 feet (2 meters) away from others. ? Disinfect objects and surfaces that are frequently touched every day. This may include:  Counters and tables.  Doorknobs and light switches.  Sinks and faucets.  Electronics, such as phones, remote controls, keyboards, computers, and tablets. To protect others: If you have symptoms of COVID-19, take steps to prevent the virus from spreading to others.  If you think you have a COVID-19 infection, contact your health care  provider right away. Tell your health care team that you think you may have a COVID-19 infection.  Stay home. Leave your house only to seek medical care. Do not use public transport.  Do not travel while you are sick.  Wash your hands often with soap and water for 20 seconds. If soap and water are not available, use alcohol-based hand sanitizer.  Stay away from other members of your household. Let healthy household members care for children and pets, if possible. If you have to care for children or pets, wash your hands often and wear a mask. If possible, stay in your own room, separate from others. Use a different bathroom.  Make sure that all people in your household wash their hands well and often.  Cough or sneeze into a tissue or your sleeve or elbow. Do not cough or sneeze into your hand or into the air.  Wear a cloth face covering or face mask. Make sure your mask covers your nose and mouth. Where to find more information  Centers for Disease Control and Prevention: www.cdc.gov/coronavirus/2019-ncov/index.html  World Health Organization: www.who.int/health-topics/coronavirus Contact a health care provider if:  You live in or have traveled to an area where COVID-19 is a risk and you have symptoms of the infection.  You have had contact with someone who has COVID-19 and you have symptoms of the infection. Get help right away if:  You have trouble breathing.  You have pain or pressure in your chest.  You have confusion.  You have bluish lips and fingernails.  You have difficulty waking from sleep.  You have symptoms that get worse. These symptoms may represent a serious problem that is an emergency. Do not wait to see if the symptoms will go away. Get medical help right away. Call your local emergency services (911 in the [place] Do not drive yourself to the hospital. Let the emergency medical personnel know if you think you have COVID-19. Summary  COVID-19 is a  respiratory infection that is caused by a virus. It is also known as coronavirus disease or novel coronavirus. It can cause serious infections, such as pneumonia, acute respiratory distress syndrome, acute respiratory failure, or sepsis.  The virus that causes COVID-19 is contagious. This means that it can spread from person to person through droplets from breathing, speaking, singing, coughing, or sneezing.  You are more likely to develop a serious illness if you are 50 years of age or older, have a weak immune system, live in a nursing home, or have chronic disease.  There is no medicine to treat COVID-19. Your health care provider will talk with you about ways to treat your symptoms.    Take steps to protect yourself and others from infection. Wash your hands often and disinfect objects and surfaces that are frequently touched every day. Stay away from people who are sick and wear a mask if you are sick. This information is not intended to replace advice given to you by your health care provider. Make sure you discuss any questions you have with your health care provider. Document Revised: 09/29/2019 Document Reviewed: 01/05/2019 Elsevier Patient Education  2020 Elsevier Inc.  

## 2020-08-23 NOTE — Progress Notes (Signed)
  Diagnosis: COVID-19  Physician:dr wright  Procedure: [facility]fusion Clinic Med: remdesivir infusion - Provided patient with remdesivir fact sheet for patients, parents and caregivers prior to infusion.  Complications: No immediate complications noted.  Discharge: Discharged home   [place] 08/23/2020

## 2020-08-23 NOTE — Progress Notes (Signed)
Mary Stark was seen in the infusion clinic today for her final dose of Remdesivir. She informed the RN that she had a fall last night hitting her head on the floor. There was no loss of consciousness. I was asked to evaluate with concern for increased risk of bleeding with Remdesivir. She is neurologically at baseline and currently without headache.Family report no change in mental status. She is a Geographical information systems officer and was reminded of the signs and symptoms of intracranial hemorrhage to observe for. No further treatment is necessary at this time.   Terri Piedra, NP 08/23/2020 10:52 AM

## 2020-08-24 LAB — CULTURE, BLOOD (ROUTINE X 2)
Culture: NO GROWTH
Culture: NO GROWTH

## 2020-08-27 ENCOUNTER — Other Ambulatory Visit: Payer: Self-pay | Admitting: *Deleted

## 2020-08-27 NOTE — Patient Outreach (Addendum)
[facility]) Care Management  08/27/2020  Mary Stark 12/28/1955 [id number]   Red on EMMI alert  Day: 4 Date: 08/26/20 Reason : Sad/hopeless/anxious/empty? Yes   Subjective: Successful outreach call to patient, explained reason for the call, follow up on EMMI interactive voice response call.  Discussed EMMI call response to question of feeling sad/hopeliss/anxious/empty, patient states that she is feeling a little concern that she might not get off oxygen and that would be her career.  She reports that she continues to use oxygen at 3 liters, 90% resting , and when she feels sure that it drops when she is active. She denies having a fever, continues with cough occasional productive, reports using tussin for cough. She continues to work with use of incentive spirometry to 1500 ml was able to get up to 2000. Patient reports having good appetite and fluid intake. Reinforced resting activity.Discussed concern regarding recovery from covid  varies , she discussed that her family is doing better. She reports that her daughter is very supportive as well as her ex husband.  Discussed when to notify MD/seek medical attention for  worsening symptoms, she reports having tele health visit with PCP on tomorrow, she wonders if she needs a chest xray, when to resume hydrochlorothiazide  encouraged to discuss ongoing concerns at visit, when to resume medications. She reports that she continues to check her blood pressure at home and it is doing okay.   Discussed [place] benefits of Employee Assistance Counseling program, she is interested.     Plan Provided patient contact number of [employer] ( Employee assistance counseling program) [phone]. Patient agreeable to follow up call in the next week.    Joylene Draft, RN, BSN  [place] Management Coordinator  [phone]- Mobile [phone]- Toll Free Main Office

## 2020-08-28 ENCOUNTER — Encounter: Payer: Self-pay | Admitting: Osteopathic Medicine

## 2020-08-28 ENCOUNTER — Telehealth (INDEPENDENT_AMBULATORY_CARE_PROVIDER_SITE_OTHER): Payer: No Typology Code available for payment source | Admitting: Osteopathic Medicine

## 2020-08-28 ENCOUNTER — Other Ambulatory Visit: Payer: Self-pay

## 2020-08-28 ENCOUNTER — Ambulatory Visit (INDEPENDENT_AMBULATORY_CARE_PROVIDER_SITE_OTHER): Payer: No Typology Code available for payment source

## 2020-08-28 VITALS — BP 106/67 | Temp 96.8°F | Wt 196.0 lb

## 2020-08-28 DIAGNOSIS — U071 COVID-19: Secondary | ICD-10-CM | POA: Diagnosis not present

## 2020-08-28 DIAGNOSIS — J22 Unspecified acute lower respiratory infection: Secondary | ICD-10-CM | POA: Diagnosis not present

## 2020-08-28 MED ORDER — ALBUTEROL SULFATE HFA 108 (90 BASE) MCG/ACT IN AERS: INHALATION_SPRAY | RESPIRATORY_TRACT | 1 refills | Status: DC

## 2020-08-28 MED ORDER — GUAIFENESIN-DM 100-10 MG/5ML PO SYRP: 10.0000 mL | ORAL_SOLUTION | ORAL | 0 refills | Status: DC | PRN

## 2020-08-28 NOTE — Progress Notes (Signed)
Virtual Visit via Video (App used: MyChart) Note  I connected with      Mary Stark on 08/28/20 at 2:15 PM  by a telemedicine application and verified that I am speaking with the correct person using two identifiers.  Patient is at home  I am in office   I discussed the limitations of evaluation and management by telemedicine and the availability of in person appointments. The patient expressed understanding and agreed to proceed.  History of Present Illness: Mary Stark is a 64 y.o. female who would like to discuss hospital follow-up post COVID treatment. Unfortunately, she contracted COVID despite vaccination (had been on steroids after recent treatment for hydronephrosis / renal stent placement, steroids likely caused some immune compromise) and was hospitalized 08/19/20, discharged few days alter w/ home O2 and completion of remdesevir and steroids. Still feeling chest congestion, still needing 3L O2 24/7 and notes SOB on exertion. Some coughing, nonproductive, No fever.    Observations/Objective: BP 106/67   Temp (!) 96.8 F (36 C)   Wt 196 lb (88.9 kg)   BMI 31.64 kg/m  BP Readings from Last 3 Encounters:  08/28/20 106/67  08/23/20 105/79  08/22/20 124/71   Exam: Normal Speech.  NAD  Lab and Radiology Results No results found for this or any previous visit (from the past 72 hour(s)). No results found.     Assessment and Plan: 63 y.o. female with The primary encounter diagnosis was COVID-19. A diagnosis of Acute lower respiratory infection was also pertinent to this visit.  Called patient 08/28/20 7:47 PM and left voicemail - I reviewed CXR, very concerning. Awaiting radiology over-read but this looks to me concerning for ARDS or multifocal pneumonia. Not sure how much help I can be outpatient especially since she noted hypoxia/SOB earlier. I advised her to seek emergency medical care, can contact our office if needed. I'll reach out to on-call person but I  calso gave patient my cell phone number if any questions.    PDMP not reviewed this encounter. Orders Placed This Encounter  Procedures  . DG Chest 2 View    Order Specific Question:   Reason for Exam (SYMPTOM  OR DIAGNOSIS REQUIRED)    Answer:   COVID recently    Order Specific Question:   Preferred imaging location?    Answer:   [facility]    Order Specific Question:   Radiology Contrast Protocol - do NOT remove file path    Answer:   [email or web address]  . BASIC METABOLIC PANEL WITH GFR  . CBC   Meds ordered this encounter  Medications  . albuterol (PROAIR HFA) 108 (90 Base) MCG/ACT inhaler    Sig: INHALE 1 TO 2 PUFFS BY MOUTH INTO THE LUNGS EVERY 4 HOURS AS NEEDED FOR WHEEZING OR SHORTNESS OF BREATH    Dispense:  8.5 g    Refill:  1  . guaiFENesin-dextromethorphan (ROBITUSSIN DM) 100-10 MG/5ML syrup    Sig: Take 10 mLs by mouth every 4 (four) hours as needed for cough.    Dispense:  473 mL    Refill:  0     Follow Up Instructions: Return for RECHECK PENDING RESULTS / IF WORSE OR CHANGE.    I discussed the assessment and treatment plan with the patient. The patient was provided an opportunity to ask questions and all were answered. The patient agreed with the plan and demonstrated an understanding of the instructions.   The patient was advised to call back  or seek an in-person evaluation if any new concerns, if symptoms worsen or if the condition fails to improve as anticipated.  30 minutes of non-face-to-face time was provided during this encounter.      . . . . . . . . . . . . . Marland Kitchen                   Historical information moved to improve visibility of documentation.  Past Medical History:  Diagnosis Date  . Alcohol dependence ([place])   . Anxiety   . Arthritis    Back and lt knee  . Cancer ([place])    skin on nose  . Depression 07/17/2013  . GERD (gastroesophageal reflux  disease)   . History of hepatitis C    TX FOR 2012  . History of kidney stones   . History of rheumatic fever AS CHILD   NO PROBLEMS FROM  . Hyperlipidemia 07/17/2013  . Hypertension   . Seasonal allergies   . Toe fracture, right 10/07/2016  . Vitamin D deficiency 07/17/2013   Past Surgical History:  Procedure Laterality Date  . ANAL RECTAL MANOMETRY N/A 01/31/2020   Procedure: ANO RECTAL MANOMETRY;  Surgeon: Arta Silence, MD;  Location: [facility];  Service: Endoscopy;  Laterality: N/A;  . [place]  . CYSTOSCOPY WITH RETROGRADE PYELOGRAM, URETEROSCOPY AND STENT PLACEMENT Bilateral 06/11/2020   Procedure: CYSTOSCOPY WITH RETROGRADE PYELOGRAM, URETEROSCOPY AND STENT PLACEMENT, RIGHT URETERAL BIOPSY;  Surgeon: Robley Fries, MD;  Location: [facility];  Service: Urology;  Laterality: Bilateral;  90 MINS  . CYSTOSCOPY WITH RETROGRADE PYELOGRAM, URETEROSCOPY AND STENT PLACEMENT Bilateral 07/05/2020   Procedure: CYSTOSCOPY WITH RETROGRADE PYELOGRAM, URETEROSCOPY,  BIOPSIES AND STENT EXCHANGES;  Surgeon: Robley Fries, MD;  [facility];  Service: Urology;  Laterality: Bilateral;  . FINGER SURGERY Right    5th  . GYNECOLOGIC CRYOSURGERY  YRS AGO  . TONSILLECTOMY  AS CHILD   Social History   Tobacco Use  . Smoking status: Former Smoker    Packs/day: 1.00    Years: 25.00    Pack years: 25.00    Types: Cigarettes    Quit date: 2012    Years since quitting: 9.7  . Smokeless tobacco: Never Used  Substance Use Topics  . Alcohol use: Not Currently    Alcohol/week: 24.0 - 36.0 standard drinks    Types: 24 - 36 Cans of beer per week    Comment: Pt reports that she stopped march 2021   family history includes Cardiomyopathy in her father; Liver cancer in her brother; Ovarian cancer in her mother; Protein C deficiency in her brother and brother; Stroke in her brother; Valvular heart disease in her father.  Medications: Current Outpatient Medications   Medication Sig Dispense Refill  . acetaminophen (TYLENOL) 500 MG tablet Take 1,000 mg by mouth every 8 (eight) hours as needed for moderate pain.     Marland Kitchen amLODipine (NORVASC) 5 MG tablet TAKE 1 TABLET BY MOUTH ONCE DAILY (Patient taking differently: Take 5 mg by mouth every evening. ) 90 tablet 1  . ARIPiprazole (ABILIFY) 5 MG tablet Take 5 mg by mouth at bedtime.     Marland Kitchen ascorbic acid (VITAMIN C) 500 MG tablet Take 1,000 mg by mouth every evening.     . ASPIRIN LOW DOSE 81 MG EC tablet TAKE 1 TABLET BY MOUTH DAILY. (Patient taking differently: Take 81 mg by mouth daily. ) 90 tablet 3  . Benzphetamine HCl  50 MG TABS Take 50 mg by mouth daily.    Marland Kitchen buPROPion (WELLBUTRIN XL) 300 MG 24 hr tablet Take 1 tablet (300 mg total) by mouth daily. 90 tablet 1  . BYSTOLIC 20 MG TABS TAKE 1 TABLET (20 MG TOTAL) BY MOUTH DAILY. (Patient taking differently: Take 20 mg by mouth daily. ) 90 tablet 0  . Cholecalciferol (VITAMIN D) 125 MCG (5000 UT) CAPS Take 5,000 Units by mouth every evening.     . clonazePAM (KLONOPIN) 0.5 MG tablet Take 0.5 mg by mouth at bedtime.     . Coenzyme Q10 (CO Q-10) 200 MG CAPS Take 200 mg by mouth every evening.     . fexofenadine (ALLEGRA) 180 MG tablet Take 180 mg by mouth every evening.     . gabapentin (NEURONTIN) 300 MG capsule 1  qam   4   qhs (Patient taking differently: Take 300-1,200 mg by mouth See admin instructions. Take 300 mg by mouth in the morning and afternoon and 1200 mg at night) 150 capsule 5  . GLUCOSAMINE-CHONDROITIN PO Take 2 tablets by mouth every evening.     . Omega-3 Fatty Acids (FISH OIL) 1200 MG CAPS Take 1,200 mg by mouth every evening.     Marland Kitchen omeprazole (PRILOSEC) 20 MG capsule Take 1 capsule (20 mg total) by mouth daily. (Patient taking differently: Take 20 mg by mouth every evening. ) 90 capsule 1  . ondansetron (ZOFRAN-ODT) 4 MG disintegrating tablet Take 1 tablet (4 mg total) by mouth every 8 (eight) hours as needed for nausea or vomiting. 12 tablet 0  .  phentermine (ADIPEX-P) 37.5 MG tablet Take 75 mg by mouth daily.     . predniSONE (DELTASONE) 10 MG tablet 4 tabs for 2 days 3 tabs for 2 days 2 tabs for 2 days 1 tab for 2 days and continue your current therapy 20 tablet 0  . PROAIR HFA 108 (90 Base) MCG/ACT inhaler INHALE 1 TO 2 PUFFS BY MOUTH INTO THE LUNGS EVERY 4 HOURS AS NEEDED FOR WHEEZING OR SHORTNESS OF BREATH (Patient taking differently: Inhale 1-2 puffs into the lungs every 4 (four) hours as needed for wheezing or shortness of breath. ) 8.5 g 1  . SAXENDA 18 MG/3ML SOPN Inject 3 mg into the skin daily.     . simvastatin (ZOCOR) 20 MG tablet TAKE 1 TABLET (20 MG TOTAL) BY MOUTH DAILY. (Patient taking differently: Take 20 mg by mouth at bedtime. ) 90 tablet 1  . tamsulosin (FLOMAX) 0.4 MG CAPS capsule Take 0.4 mg by mouth at bedtime.    . tolterodine (DETROL LA) 4 MG 24 hr capsule Take 4 mg by mouth daily.    . vitamin B-12 (CYANOCOBALAMIN) 1000 MCG tablet Take 2,000 mcg by mouth every evening.     . Vitamin E 180 MG (400 UNIT) CAPS Take 400 Units by mouth every evening.      No current facility-administered medications for this visit.   Allergies  Allergen Reactions  . Metformin And Related Other (See Comments)    Lactic acidosis   . Celebrex [Celecoxib] Other (See Comments)    Dizziness, "makes me feel bad"  . Penicillins Hives and Rash    Has patient had a PCN reaction causing immediate rash, facial/tongue/throat swelling, SOB or lightheadedness with hypotension: Yes Has patient had a PCN reaction causing severe rash involving mucus membranes or skin necrosis:No Has patient had a PCN reaction that required hospitalization: Yes Has patient had a PCN reaction occurring within  the last 10 years: No If all of the above answers are "NO", then may proceed with Cephalosporin use.

## 2020-08-29 ENCOUNTER — Other Ambulatory Visit: Payer: Self-pay

## 2020-08-29 ENCOUNTER — Telehealth: Payer: Self-pay

## 2020-08-29 ENCOUNTER — Emergency Department (HOSPITAL_COMMUNITY): Payer: No Typology Code available for payment source

## 2020-08-29 ENCOUNTER — Emergency Department (HOSPITAL_COMMUNITY)
Admission: EM | Admit: 2020-08-29 | Discharge: 2020-08-29 | Disposition: A | Discharge status: Home / Self Care | Payer: No Typology Code available for payment source | Attending: Emergency Medicine | Admitting: Emergency Medicine

## 2020-08-29 ENCOUNTER — Encounter (HOSPITAL_COMMUNITY): Payer: Self-pay | Admitting: Emergency Medicine

## 2020-08-29 DIAGNOSIS — I10 Essential (primary) hypertension: Secondary | ICD-10-CM | POA: Insufficient documentation

## 2020-08-29 DIAGNOSIS — Z79899 Other long term (current) drug therapy: Secondary | ICD-10-CM | POA: Insufficient documentation

## 2020-08-29 DIAGNOSIS — R05 Cough: Secondary | ICD-10-CM | POA: Diagnosis not present

## 2020-08-29 DIAGNOSIS — R0602 Shortness of breath: Secondary | ICD-10-CM | POA: Diagnosis not present

## 2020-08-29 DIAGNOSIS — U071 COVID-19: Secondary | ICD-10-CM

## 2020-08-29 DIAGNOSIS — Z87891 Personal history of nicotine dependence: Secondary | ICD-10-CM | POA: Diagnosis not present

## 2020-08-29 LAB — CBC
HCT: 32 % — ABNORMAL LOW (ref 35.0–45.0)
HCT: 31.2 % — ABNORMAL LOW (ref 36.0–46.0)
Hemoglobin: 10.2 g/dL — ABNORMAL LOW (ref 11.7–15.5)
Hemoglobin: 9.7 g/dL — ABNORMAL LOW (ref 12.0–15.0)
MCH: 29.4 pg (ref 27.0–33.0)
MCH: 29.8 pg (ref 26.0–34.0)
MCHC: 31.9 g/dL — ABNORMAL LOW (ref 32.0–36.0)
MCHC: 31.1 g/dL (ref 30.0–36.0)
MCV: 92.2 fL (ref 80.0–100.0)
MCV: 95.7 fL (ref 80.0–100.0)
MPV: 9.4 fL (ref 7.5–12.5)
Platelets: 574 10*3/uL — ABNORMAL HIGH (ref 140–400)
Platelets: 504 10*3/uL — ABNORMAL HIGH (ref 150–400)
RBC: 3.47 10*6/uL — ABNORMAL LOW (ref 3.80–5.10)
RBC: 3.26 MIL/uL — ABNORMAL LOW (ref 3.87–5.11)
RDW: 13.3 % (ref 11.0–15.0)
RDW: 14.8 % (ref 11.5–15.5)
WBC: 13.7 10*3/uL — ABNORMAL HIGH (ref 3.8–10.8)
WBC: 14.4 10*3/uL — ABNORMAL HIGH (ref 4.0–10.5)
nRBC: 0 % (ref 0.0–0.2)

## 2020-08-29 LAB — BASIC METABOLIC PANEL WITH GFR
BUN: 13 mg/dL (ref 7–25)
CO2: 24 mmol/L (ref 20–32)
Calcium: 8 mg/dL — ABNORMAL LOW (ref 8.6–10.4)
Chloride: 100 mmol/L (ref 98–110)
Creat: 0.79 mg/dL (ref 0.50–0.99)
GFR, Est African American: 92 mL/min/{1.73_m2} (ref >=60)
GFR, Est Non African American: 80 mL/min/{1.73_m2} (ref >=60)
Glucose, Bld: 167 mg/dL — ABNORMAL HIGH (ref 65–139)
Potassium: 4.7 mmol/L (ref 3.5–5.3)
Sodium: 134 mmol/L — ABNORMAL LOW (ref 135–146)

## 2020-08-29 LAB — COMPREHENSIVE METABOLIC PANEL
ALT: 15 U/L (ref 0–44)
AST: 14 U/L — ABNORMAL LOW (ref 15–41)
Albumin: 2.6 g/dL — ABNORMAL LOW (ref 3.5–5.0)
Alkaline Phosphatase: 59 U/L (ref 38–126)
Anion gap: 12 (ref 5–15)
BUN: 17 mg/dL (ref 8–23)
CO2: 23 mmol/L (ref 22–32)
Calcium: 8.4 mg/dL — ABNORMAL LOW (ref 8.9–10.3)
Chloride: 101 mmol/L (ref 98–111)
Creatinine, Ser: 0.94 mg/dL (ref 0.44–1.00)
GFR calc Af Amer: >60 mL/min (ref >60)
GFR calc non Af Amer: >60 mL/min (ref >60)
Glucose, Bld: 165 mg/dL — ABNORMAL HIGH (ref 70–99)
Potassium: 4.5 mmol/L (ref 3.5–5.1)
Sodium: 136 mmol/L (ref 135–145)
Total Bilirubin: 0.3 mg/dL (ref 0.3–1.2)
Total Protein: 7.1 g/dL (ref 6.5–8.1)

## 2020-08-29 MED ORDER — CEFDINIR 300 MG PO CAPS: 300.0000 mg | ORAL_CAPSULE | Freq: Two times a day (BID) | ORAL | 0 refills | Status: DC

## 2020-08-29 MED ORDER — ALBUTEROL SULFATE HFA 108 (90 BASE) MCG/ACT IN AERS
6.0000 | INHALATION_SPRAY | Freq: Once | RESPIRATORY_TRACT | Status: AC
  Administered 2020-08-29: 6 via RESPIRATORY_TRACT
  Filled 2020-08-29: qty 6.7

## 2020-08-29 MED ORDER — LEVOFLOXACIN 750 MG PO TABS: 750.0000 mg | ORAL_TABLET | Freq: Every day | ORAL | 0 refills | Status: DC

## 2020-08-29 NOTE — Discharge Instructions (Signed)
It was our pleasure to provide your ER care today - we hope that you feel better.  Continue your medication. Stay active, take full and deep breaths, use incentive spirometer. Spend as much time as possible upright, and/or prone (I.e. minimize time spent lying flat on back).  Use albuterol inhaler 2-3 puffs every 4 hours as need if wheezing.   Return to ER if worse, new symptoms, fevers, chest pain, increased trouble breathing, weak/fainting, other concern.

## 2020-08-29 NOTE — ED Triage Notes (Signed)
Pt c/o SOB. Pt stated every time she ambulate her oxygen drops to low 80's with 3L [place]. Patient positive for COVID.

## 2020-08-29 NOTE — ED Provider Notes (Addendum)
[facility] DEPT Provider Note   CSN: [id number] Arrival date & time: 08/29/20  1553     History Chief Complaint  Patient presents with  . Shortness of Breath    Laryn Mary Stark is a 64 y.o. female.  Patient with recent dx covid, and admission for same, presents with increased sob in the past couple days. Symptoms acute onset 2 weeks ago, moderate-severe, constant, persistent. States d/cd home a few days ago on home o2, and when at rest, feels breathing is same as when discharged, but notes dyspnea w exertion. States o2 requirement has not increased. Intermittent non prod cough, not acutely worsening. No sore throat. No chest pain or discomfort. No leg pain or swelling. No fevers. Was told recent outpatient cxr 'worse' so was concerned and came to ED today.   The history is provided by the patient.       Past Medical History:  Diagnosis Date  . Alcohol dependence ([place])   . Anxiety   . Arthritis    Back and lt knee  . Cancer ([place])    skin on nose  . Depression 07/17/2013  . GERD (gastroesophageal reflux disease)   . History of hepatitis C    TX FOR 2012  . History of kidney stones   . History of rheumatic fever AS CHILD   NO PROBLEMS FROM  . Hyperlipidemia 07/17/2013  . Hypertension   . Seasonal allergies   . Toe fracture, right 10/07/2016  . Vitamin D deficiency 07/17/2013    Patient Active Problem List   Diagnosis Date Noted  . Acute hypoxemic respiratory failure due to COVID-19 ([place]) 08/19/2020  . Hyponatremia 08/19/2020  . Tear of medial meniscus of left knee, current 07/13/2019  . Encounter for monitoring NSAID therapy 07/13/2019  . Internal derangement of knee involving posterior horn of lateral meniscus, left 07/13/2019  . Primary osteoarthritis of left knee 07/13/2019  . Renal insufficiency 07/13/2019  . Anxiety about health 03/22/2019  . Hypokalemia 03/21/2019  . Chronic pain of left knee 02/13/2019  . Essential hypertension  02/13/2019  . 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/2018  . Anemia due to blood loss, acute 04/14/2018  . Hypomagnesemia 04/14/2018  . Hypocalcemia 04/14/2018  . Tobacco abuse 04/08/2018  . Depression 04/07/2018  . AKI (acute kidney injury) ([place]) 04/07/2018  . Sepsis ([place]) 04/07/2018  . Acute colitis 04/07/2018  . Avascular necrosis of humeral head, right ([place]) 03/30/2018  . NSAID long-term use 03/30/2018  . Encounter for weight loss counseling 03/30/2018  . Seborrheic keratoses 03/15/2018  . History of nonmelanoma skin cancer 03/02/2018  . Skin lesion of chest wall 03/02/2018  . Alcohol dependence with unspecified alcohol-induced disorder ([place]) 02/11/2018  . Severe episode of recurrent major depressive disorder, without psychotic features ([place]) 02/11/2018  . Transaminitis 01/06/2018  . Nicotine dependence 01/06/2018  . Heavy alcohol consumption 01/06/2018  . Encounter for monitoring statin therapy 01/06/2018  . Class 1 obesity due to excess calories with serious comorbidity in adult 01/06/2018  . Chronic pain syndrome 04/21/2017  . Chronic right shoulder pain 07/03/2015  . Vaginismus 04/12/2014  . Menopausal state 11/08/2013  . Family history of ovarian cancer 09/27/2013  . 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 Personal history of colonic polyps 07/18/2013  . Dyslipidemia, goal LDL below 100 07/17/2013  . Depression with anxiety 07/17/2013  . Hx of hepatitis C 07/17/2013  . Vitamin D deficiency 07/17/2013  . Right lumbar radiculopathy 07/17/2013  . History of alcohol dependence ([place]) 07/17/2013  .  H/O: rheumatic fever 06/30/2012  . Insomnia 06/30/2012    Past Surgical History:  Procedure Laterality Date  . ANAL RECTAL MANOMETRY N/A 01/31/2020   Procedure: ANO RECTAL MANOMETRY;  Surgeon: Arta Silence, MD;  Location: [facility];  Service: Endoscopy;  Laterality: N/A;  . [place]  . CYSTOSCOPY WITH RETROGRADE PYELOGRAM, URETEROSCOPY AND STENT PLACEMENT Bilateral  06/11/2020   Procedure: CYSTOSCOPY WITH RETROGRADE PYELOGRAM, URETEROSCOPY AND STENT PLACEMENT, RIGHT URETERAL BIOPSY;  Surgeon: Robley Fries, MD;  Location: [facility] OR[facility];  Service: Urology;  Laterality: Bilateral;  90 MINS  . CYSTOSCOPY WITH RETROGRADE PYELOGRAM, URETEROSCOPY AND STENT PLACEMENT Bilateral 07/05/2020   Procedure: CYSTOSCOPY WITH RETROGRADE PYELOGRAM, URETEROSCOPY,  BIOPSIES AND STENT EXCHANGES;  Surgeon: Robley Fries, MD;  Location: [facility];  Service: Urology;  Laterality: Bilateral;  . FINGER SURGERY Right    5th  . GYNECOLOGIC CRYOSURGERY  YRS AGO  . TONSILLECTOMY  AS CHILD     OB History   No obstetric history on file.     Family History  Problem Relation Age of Onset  . Ovarian cancer Mother   . Cardiomyopathy Father   . Valvular heart disease Father   . Liver cancer Brother   . Protein C deficiency Brother   . Protein C deficiency Brother   . Stroke Brother     Social History   Tobacco Use  . Smoking status: Former Smoker    Packs/day: 1.00    Years: 25.00    Pack years: 25.00    Types: Cigarettes    Quit date: 2012    Years since quitting: 9.7  . Smokeless tobacco: Never Used  Vaping Use  . Vaping Use: Every day  . Substances: Nicotine  Substance Use Topics  . Alcohol use: Not Currently    Alcohol/week: 24.0 - 36.0 standard drinks    Types: 24 - 36 Cans of beer per week    Comment: Pt reports that she stopped march 2021  . Drug use: No    Home Medications Prior to Admission medications   Medication Sig Start Date End Date Taking? Authorizing Provider  acetaminophen (TYLENOL) 500 MG tablet Take 1,000 mg by mouth every 8 (eight) hours as needed for moderate pain.     [provider]  albuterol (PROAIR HFA) 108 (90 Base) MCG/ACT inhaler INHALE 1 TO 2 PUFFS BY MOUTH INTO THE LUNGS EVERY 4 HOURS AS NEEDED FOR WHEEZING OR SHORTNESS OF BREATH 08/28/20   [name], DO  amLODipine (NORVASC) 5 MG tablet TAKE 1  TABLET BY MOUTH ONCE DAILY Patient taking differently: Take 5 mg by mouth every evening.  06/13/20   [name], DO  ARIPiprazole (ABILIFY) 5 MG tablet Take 5 mg by mouth at bedtime.     [provider]  ascorbic acid (VITAMIN C) 500 MG tablet Take 1,000 mg by mouth every evening.     [provider]  ASPIRIN LOW DOSE 81 MG EC tablet TAKE 1 TABLET BY MOUTH DAILY. Patient taking differently: Take 81 mg by mouth daily.  04/09/20   [name], DO  Benzphetamine HCl 50 MG TABS Take 50 mg by mouth daily.    [provider]  buPROPion (WELLBUTRIN XL) 300 MG 24 hr tablet Take 1 tablet (300 mg total) by mouth daily. 11/22/19   Plovsky, Berneta Sages, MD  BYSTOLIC 20 MG TABS TAKE 1 TABLET (20 MG TOTAL) BY MOUTH DAILY. Patient taking differently: Take 20 mg by mouth daily.  07/03/20  [name], DO  cefdinir (OMNICEF) 300 MG capsule Take 1 capsule (300 mg total) by mouth 2 (two) times daily. 08/29/20   [name], DO  Cholecalciferol (VITAMIN D) 125 MCG (5000 UT) CAPS Take 5,000 Units by mouth every evening.     [provider]  clonazePAM (KLONOPIN) 0.5 MG tablet Take 0.5 mg by mouth at bedtime.  04/15/20   [provider]  Coenzyme Q10 (CO Q-10) 200 MG CAPS Take 200 mg by mouth every evening.     [provider]  fexofenadine (ALLEGRA) 180 MG tablet Take 180 mg by mouth every evening.     [provider]  gabapentin (NEURONTIN) 300 MG capsule 1  qam   4   qhs Patient taking differently: Take 300-1,200 mg by mouth See admin instructions. Take 300 mg by mouth in the morning and afternoon and 1200 mg at night 11/22/19   Plovsky, Berneta Sages, MD  GLUCOSAMINE-CHONDROITIN PO Take 2 tablets by mouth every evening.     [provider]  guaiFENesin-dextromethorphan (ROBITUSSIN DM) 100-10 MG/5ML syrup Take 10 mLs by mouth every 4 (four) hours as needed for cough. 08/28/20   [name], DO  levofloxacin (LEVAQUIN) 750 MG  tablet Take 1 tablet (750 mg total) by mouth daily. 08/29/20   [name], DO  Omega-3 Fatty Acids (FISH OIL) 1200 MG CAPS Take 1,200 mg by mouth every evening.     [provider]  omeprazole (PRILOSEC) 20 MG capsule Take 1 capsule (20 mg total) by mouth daily. Patient taking differently: Take 20 mg by mouth every evening.  05/30/20   [name], DO  ondansetron (ZOFRAN-ODT) 4 MG disintegrating tablet Take 1 tablet (4 mg total) by mouth every 8 (eight) hours as needed for nausea or vomiting. 06/03/20   Noe Gens, PA-C  phentermine (ADIPEX-P) 37.5 MG tablet Take 75 mg by mouth daily.     [provider]  predniSONE (DELTASONE) 10 MG tablet 4 tabs for 2 days 3 tabs for 2 days 2 tabs for 2 days 1 tab for 2 days and continue your current therapy 08/21/20   Barb Merino, MD  SAXENDA 18 MG/3ML SOPN Inject 3 mg into the skin daily.  02/28/19   [provider]  simvastatin (ZOCOR) 20 MG tablet TAKE 1 TABLET (20 MG TOTAL) BY MOUTH DAILY. Patient taking differently: Take 20 mg by mouth at bedtime.  01/24/20   [name], DO  tamsulosin (FLOMAX) 0.4 MG CAPS capsule Take 0.4 mg by mouth at bedtime. 08/07/20   [provider]  tolterodine (DETROL LA) 4 MG 24 hr capsule Take 4 mg by mouth daily. 07/12/20   [provider]  vitamin B-12 (CYANOCOBALAMIN) 1000 MCG tablet Take 2,000 mcg by mouth every evening.     [provider]  Vitamin E 180 MG (400 UNIT) CAPS Take 400 Units by mouth every evening.     [provider]    Allergies    Metformin and related, Celebrex [celecoxib], and Penicillins  Review of Systems   Review of Systems  Constitutional: Negative for chills and fever.  HENT: Negative for sore throat.   Eyes: Negative for redness.  Respiratory: Positive for cough and shortness of breath.   Cardiovascular: Negative for chest pain, palpitations and leg swelling.  Gastrointestinal: Negative for abdominal pain,  diarrhea and vomiting.  Genitourinary: Negative for flank pain.  Musculoskeletal: Negative for back pain and neck pain.  Skin: Negative for rash.  Neurological: Negative for headaches.  Hematological: Does not bruise/bleed easily.  Psychiatric/Behavioral: Negative for confusion.    Physical Exam Updated Vital Signs BP (!) 128/91 (BP Location: Right Arm)   Pulse 73   Temp 98.3 F (36.8 C) (Oral)   Resp 16   Ht 1.676 m (5\' 6" )   Wt 88.9 kg   SpO2 95%   BMI 31.64 kg/m   Physical Exam Vitals and nursing note reviewed.  Constitutional:      Appearance: Normal appearance. She is well-developed.  HENT:     Head: Atraumatic.     Nose: Nose normal.     Mouth/Throat:     Mouth: Mucous membranes are moist.  Eyes:     General: No scleral icterus.    Conjunctiva/sclera: Conjunctivae normal.     Pupils: Pupils are equal, round, and reactive to light.  Neck:     Trachea: No tracheal deviation.  Cardiovascular:     Rate and Rhythm: Normal rate and regular rhythm.     Pulses: Normal pulses.     Heart sounds: Normal heart sounds. No murmur heard.  No friction rub. No gallop.   Pulmonary:     Effort: Pulmonary effort is normal. No respiratory distress.     Breath sounds: Normal breath sounds.     Comments: Mild wheezing.  Abdominal:     General: Bowel sounds are normal. There is no distension.     Palpations: Abdomen is soft.     Tenderness: There is no abdominal tenderness. There is no guarding.  Genitourinary:    Comments: No cva tenderness.  Musculoskeletal:        General: No swelling or tenderness.     Cervical back: Normal range of motion and neck supple. No rigidity. No muscular tenderness.     Right lower leg: No edema.     Left lower leg: No edema.  Skin:    General: Skin is warm and dry.     Findings: No rash.  Neurological:     Mental Status: She is alert.     Comments: Alert, speech normal.   Psychiatric:        Mood and Affect: Mood normal.     ED Results  / Procedures / Treatments   Labs (all labs ordered are listed, but only abnormal results are displayed) Results for orders placed or performed during the hospital encounter of 08/29/20  CBC  Result Value Ref Range   WBC 14.4 (H) 4.0 - 10.5 K/uL   RBC 3.26 (L) 3.87 - 5.11 MIL/uL   Hemoglobin 9.7 (L) 12.0 - 15.0 g/dL   HCT 31.2 (L) 36 - 46 %   MCV 95.7 80.0 - 100.0 fL   MCH 29.8 26.0 - 34.0 pg   MCHC 31.1 30.0 - 36.0 g/dL   RDW 14.8 11.5 - 15.5 %   Platelets 504 (H) 150 - 400 K/uL   nRBC 0.0 0.0 - 0.2 %  Comprehensive metabolic panel  Result Value Ref Range   Sodium 136 135 - 145 mmol/L   Potassium 4.5 3.5 - 5.1 mmol/L   Chloride 101 98 - 111 mmol/L   CO2 23 22 - 32 mmol/L   Glucose, Bld 165 (H) 70 - 99 mg/dL   BUN 17 8 - 23 mg/dL   Creatinine, Ser 0.94 0.44 - 1.00 mg/dL   Calcium 8.4 (L) 8.9 - 10.3 mg/dL   Total Protein 7.1 6.5 - 8.1 g/dL   Albumin 2.6 (L) 3.5 - 5.0 g/dL   AST 14 (L) 15 -  41 U/L   ALT 15 0 - 44 U/L   Alkaline Phosphatase 59 38 - 126 U/L   Total Bilirubin 0.3 0.3 - 1.2 mg/dL   GFR calc non Af Amer >60 >60 mL/min   GFR calc Af Amer >60 >60 mL/min   Anion gap 12 5 - 15   DG Chest 2 View  Result Date: 08/29/2020 CLINICAL DATA:  Worsening shortness of breath. Recently diagnosed with COVID-19. EXAM: CHEST - 2 VIEW COMPARISON:  Chest radiograph and CTA 08/19/2020 FINDINGS: The cardiomediastinal silhouette is unchanged with normal heart size. There are widespread patchy and confluent opacities located predominantly peripherally in both lungs which have progressed from the prior radiograph and CT and are overall worse on the right than the left. No pleural effusion or pneumothorax is identified. Bilateral ureteral stents are partially visualized. No acute osseous abnormality is seen. IMPRESSION: Worsening bilateral lung opacities consistent with pneumonia. Electronically Signed   By: Logan Bores M.D.   On: 08/29/2020 16:36   CT Angio Chest PE W and/or Wo  Contrast  Result Date: 08/19/2020 CLINICAL DATA:  Chest pain and shortness of breath. Tested positive for COVID-19 five days ago peer EXAM: CT ANGIOGRAPHY CHEST WITH CONTRAST TECHNIQUE: Multidetector CT imaging of the chest was performed using the standard protocol during bolus administration of intravenous contrast. Multiplanar CT image reconstructions and MIPs were obtained to evaluate the vascular anatomy. CONTRAST:  162mL OMNIPAQUE IOHEXOL 350 MG/ML SOLN COMPARISON:  Current chest radiograph. FINDINGS: Cardiovascular: There is satisfactory opacification of the pulmonary arteries to the segmental level. There is no evidence of a pulmonary embolism. Heart is normal in size and configuration. No pericardial effusion. No coronary artery calcifications. Great vessels are unremarkable. Mediastinum/Nodes: No enlarged mediastinal, hilar, or axillary lymph nodes. Thyroid gland, trachea, and esophagus demonstrate no significant findings. Lungs/Pleura: There are bilateral ground-glass lung opacities involving both lungs, all lobes. No evidence of pulmonary edema. No pleural effusion or pneumothorax. Upper Abdomen: No acute findings.  Partly imaged left renal stent. Musculoskeletal: No fracture or acute finding. No osteoblastic or osteolytic lesions. No chest wall masses. Review of the MIP images confirms the above findings. IMPRESSION: 1. No evidence of a pulmonary embolism. 2. Bilateral ground-glass lung opacities consistent with multifocal pneumonia, pattern compatible with COVID-19 infection. Electronically Signed   By: Lajean Manes M.D.   On: 08/19/2020 17:26   DG Chest Port 1 View  Result Date: 08/29/2020 CLINICAL DATA:  Increased shortness of breath, covid positive EXAM: PORTABLE CHEST 1 VIEW COMPARISON:  August 28, 2020 FINDINGS: The heart size and mediastinal contours are within normal limits. Patchy peripherally based airspace opacities are seen throughout both lungs, predominantly within the lower lungs.  There is slight interval worsening since the prior exam. No pleural effusion. No acute osseous abnormality. IMPRESSION: Slight interval worsening in the multifocal airspace opacities, consistent with multifocal pneumonia. Electronically Signed   By: Prudencio Pair M.D.   On: 08/29/2020 17:53   DG Chest Port 1 View  Result Date: 08/19/2020 CLINICAL DATA:  Shortness of breath and fever for the past week. Ex-smoker. EXAM: PORTABLE CHEST 1 VIEW COMPARISON:  None. FINDINGS: Normal sized heart. Small amount of linear atelectasis or scarring in the left lower lung zone. Right lung. Minimal peribronchial thickening and prominence of the interstitial markings. Unremarkable bones. IMPRESSION: No acute abnormality.  Minimal chronic bronchitic changes. Electronically Signed   By: Claudie Revering M.D.   On: 08/19/2020 15:12    EKG EKG Interpretation  Date/Time:  Thursday  August 29 2020 17:12:40 EDT Ventricular Rate:  76 PR Interval:    QRS Duration: 82 QT Interval:  352 QTC Calculation: 396 R Axis:   40 Text Interpretation: Sinus rhythm Nonspecific T wave abnormality Baseline wander Confirmed by Lajean Saver [phone]) on 08/29/2020 7:03:33 PM   Radiology DG Chest 2 View  Result Date: 08/29/2020 CLINICAL DATA:  Worsening shortness of breath. Recently diagnosed with COVID-19. EXAM: CHEST - 2 VIEW COMPARISON:  Chest radiograph and CTA 08/19/2020 FINDINGS: The cardiomediastinal silhouette is unchanged with normal heart size. There are widespread patchy and confluent opacities located predominantly peripherally in both lungs which have progressed from the prior radiograph and CT and are overall worse on the right than the left. No pleural effusion or pneumothorax is identified. Bilateral ureteral stents are partially visualized. No acute osseous abnormality is seen. IMPRESSION: Worsening bilateral lung opacities consistent with pneumonia. Electronically Signed   By: Logan Bores M.D.   On: 08/29/2020 16:36   DG  Chest Port 1 View  Result Date: 08/29/2020 CLINICAL DATA:  Increased shortness of breath, covid positive EXAM: PORTABLE CHEST 1 VIEW COMPARISON:  August 28, 2020 FINDINGS: The heart size and mediastinal contours are within normal limits. Patchy peripherally based airspace opacities are seen throughout both lungs, predominantly within the lower lungs. There is slight interval worsening since the prior exam. No pleural effusion. No acute osseous abnormality. IMPRESSION: Slight interval worsening in the multifocal airspace opacities, consistent with multifocal pneumonia. Electronically Signed   By: Prudencio Pair M.D.   On: 08/29/2020 17:53    Procedures Procedures (including critical care time)  Medications Ordered in ED Medications - No data to display  ED Course  I have reviewed the triage vital signs and the nursing notes.  Pertinent labs & imaging results that were available during my care of the patient were reviewed by me and considered in my medical decision making (see chart for details).    MDM Rules/Calculators/A&P                         Labs. Cxr.   Reviewed nursing notes and prior charts for additional history. Recent covid admission. CTA then neg for PE, bil infiltrates c/w covid.   Albuterol treatment.   CXR reviewed/interpreted by me - ?slight interval increase in infiltrate  Labs reviewed/interpreted by me - chem normal. hct c/w baseline.   CINDE EBERT was evaluated in Emergency Department on 08/29/2020 for the symptoms described in the history of present illness. She was evaluated in the context of the global COVID-19 pandemic, which necessitated consideration that the patient might be at risk for infection with the SARS-CoV-2 virus that causes COVID-19. Institutional protocols and algorithms that pertain to the evaluation of patients at risk for COVID-19 are in a state of rapid change based on information released by regulatory bodies including the CDC and federal  and state organizations. These policies and algorithms were followed during the patient's care in the ED.  Recheck pt, breathing comfortably, without increased wob on 3 liters [place] (same as recent d/c).  sats maintaining at 95%.   Rec incentive spirometer, activity as tolerated. Alb mdi prn. Home o2. Close pcp f/u.  Return precautions provided.    Final Clinical Impression(s) / ED Diagnoses Final diagnoses:  None    Rx / DC Orders ED Discharge Orders    None           Lajean Saver, MD 08/29/20 [phone]

## 2020-08-29 NOTE — Telephone Encounter (Signed)
See mychart message / result note

## 2020-08-29 NOTE — Addendum Note (Signed)
Addended by: Maryla Morrow on: 08/29/2020 05:02 PM   Modules accepted: Orders

## 2020-08-29 NOTE — Telephone Encounter (Signed)
Pt called stating she did not go to the ER as advised by provider regarding her chest X-ray. Per pt, she did not think it would be worth her time. She is requesting alternative recommendations. Pls advise, thanks.

## 2020-08-30 NOTE — Telephone Encounter (Signed)
Noted  

## 2020-09-03 ENCOUNTER — Telehealth: Payer: Self-pay | Admitting: Osteopathic Medicine

## 2020-09-03 ENCOUNTER — Other Ambulatory Visit: Payer: Self-pay | Admitting: *Deleted

## 2020-09-03 NOTE — Telephone Encounter (Signed)
Pt will need to schedule a virtual appointment. If she has forms that required to be completed by the provider, she can attached a copy via [place]. Thanks.

## 2020-09-03 NOTE — Patient Outreach (Signed)
[facility]) Care Management  09/03/2020  Mary Stark 05/16/1956 [id number]   Red on EMMI alert / Follow up call Day: 4 Date: 08/26/20 Reason : Sad/hopeless/anxious/empty? Yes   Subjective: Successful outreach call to patient , she discussed recent visit to ED due to concerns for pneumonia.  She reports she continues to feel weak and fatigued, she reports yesterday was a better day, compared to today so far. She reports she can tell some improvement since starting on antibiotics.  She reports continuing to wear oxygen at 2 liters, with oxygen saturation at mid 90's, and down to 80's when up , but return to 90's when back resting. Reinforced with patient balancing rest with activity, sitting up at much as tolerating. She reports using albuterol inhaler as needed, none on yesterday but anticipates will use on today. Reinforced with patient continued use of incentive spirometry, adequate fluid intake and nutrition . She verifies that she is no longer vaping.   Patient discussed concern regarding her hemoglobin being at 9.7 which is lower than recent check. She also discussed having stents in place in ureters in possible plan to have removed at visit with urology on 9/27. [name] states that she has noted pink/blood tinged urine and clots at times over the last week, stating she has experienced this before since having stents in place.  Encouraged patient to notify urology of concern related to noted bleeding, she is agreeable.    Objective: [name] was hospitalized [facility]  From 9/6-08/21/20 for Acute Respiratory failure, Covid 19 Pneumonia Comorbidities include: Hypertension, hyperlipidemia, stage 3 CKD, treated hepatitis, hx of bilateral hydronephrosis and bilateral stents .  She  was discharged to home on 08/21/20 without the need for home health services, discharged home with home oxygen . Patient with ED visit on 08/30/20 for shortness of breath.    Plan Will plan follow up call to patient in the next week to assess for ongoing care coordination needs.   Joylene Draft, RN, BSN  [place] Management Coordinator  [phone]- Mobile [phone]- Toll Free Main Office

## 2020-09-03 NOTE — Telephone Encounter (Signed)
Appointment has been made. No further questions at this time.  

## 2020-09-03 NOTE — Telephone Encounter (Signed)
Patient wanting to know if she can come in office. She stated she was positive in august but I see a note for 09/06. She has some forms she wants filled out as well.

## 2020-09-04 MED FILL — ASPIRIN LOW DOSE 81 MG TBEC: 81 | 90 days supply | Qty: 90 | Fill #1

## 2020-09-04 MED FILL — GABAPENTIN 300 MG CAPSULE: 300 | 30 days supply | Qty: 180 | Fill #1

## 2020-09-05 ENCOUNTER — Encounter: Payer: Self-pay | Admitting: Osteopathic Medicine

## 2020-09-05 ENCOUNTER — Ambulatory Visit (INDEPENDENT_AMBULATORY_CARE_PROVIDER_SITE_OTHER): Payer: No Typology Code available for payment source | Admitting: Osteopathic Medicine

## 2020-09-05 ENCOUNTER — Other Ambulatory Visit: Payer: Self-pay

## 2020-09-05 VITALS — BP 119/80 | HR 41

## 2020-09-05 DIAGNOSIS — R5381 Other malaise: Secondary | ICD-10-CM

## 2020-09-05 DIAGNOSIS — U071 COVID-19: Secondary | ICD-10-CM | POA: Diagnosis not present

## 2020-09-05 DIAGNOSIS — D649 Anemia, unspecified: Secondary | ICD-10-CM

## 2020-09-05 DIAGNOSIS — B948 Sequelae of other specified infectious and parasitic diseases: Secondary | ICD-10-CM

## 2020-09-05 LAB — CBC WITH DIFFERENTIAL/PLATELET
Absolute Monocytes: 1071 cells/uL — ABNORMAL HIGH (ref 200–950)
Basophils Absolute: 94 cells/uL (ref 0–200)
Basophils Relative: 0.9 %
Eosinophils Absolute: 42 cells/uL (ref 15–500)
Eosinophils Relative: 0.4 %
HCT: 28.5 % — ABNORMAL LOW (ref 35.0–45.0)
Hemoglobin: 9.3 g/dL — ABNORMAL LOW (ref 11.7–15.5)
Lymphs Abs: 2007 cells/uL (ref 850–3900)
MCH: 29.9 pg (ref 27.0–33.0)
MCHC: 32.6 g/dL (ref 32.0–36.0)
MCV: 91.6 fL (ref 80.0–100.0)
MPV: 9.6 fL (ref 7.5–12.5)
Monocytes Relative: 10.3 %
Neutro Abs: 7186 cells/uL (ref 1500–7800)
Neutrophils Relative %: 69.1 %
Platelets: 313 10*3/uL (ref 140–400)
RBC: 3.11 10*6/uL — ABNORMAL LOW (ref 3.80–5.10)
RDW: 13.5 % (ref 11.0–15.0)
Total Lymphocyte: 19.3 %
WBC: 10.4 10*3/uL (ref 3.8–10.8)

## 2020-09-05 LAB — COMPLETE METABOLIC PANEL WITH GFR
AG Ratio: 1 (calc) (ref 1.0–2.5)
ALT: 9 U/L (ref 6–29)
AST: 11 U/L (ref 10–35)
Albumin: 3.3 g/dL — ABNORMAL LOW (ref 3.6–5.1)
Alkaline phosphatase (APISO): 66 U/L (ref 37–153)
BUN/Creatinine Ratio: 18 (calc) (ref 6–22)
BUN: 18 mg/dL (ref 7–25)
CO2: 27 mmol/L (ref 20–32)
Calcium: 8.5 mg/dL — ABNORMAL LOW (ref 8.6–10.4)
Chloride: 99 mmol/L (ref 98–110)
Creat: 1 mg/dL — ABNORMAL HIGH (ref 0.50–0.99)
GFR, Est African American: 69 mL/min/{1.73_m2} (ref >=60)
GFR, Est Non African American: 60 mL/min/{1.73_m2} (ref >=60)
Globulin: 3.3 g/dL (calc) (ref 1.9–3.7)
Glucose, Bld: 90 mg/dL (ref 65–99)
Potassium: 4.4 mmol/L (ref 3.5–5.3)
Sodium: 135 mmol/L (ref 135–146)
Total Bilirubin: 0.2 mg/dL (ref 0.2–1.2)
Total Protein: 6.6 g/dL (ref 6.1–8.1)

## 2020-09-05 NOTE — Progress Notes (Signed)
Mary Stark is a 64 y.o. female who presents to  [place] [facility]  today, 09/05/20, seeking care for the following:  . Follow-up COVID, pneumonia.  See previous notes 08/28/2020 for full details.  She did eventually end up going to the ER, no concerns on physical exam at that point, did not feel need for admission.  Thankfully, at this point, patient is feeling significantly better though still requiring oxygen, especially with exertion.  No fever.  No shortness of breath at rest.  On exam, cardiovascular exam normal with RRR, normal S1-S2, no LE edema.  Respiratory effort normal, patient is on 2.5 to 3 L of O2 via [place].  Diffuse very faint wheeze in all lung fields, no significant focal abnormality.     ASSESSMENT & PLAN with other pertinent findings:  The primary encounter diagnosis was COVID-19 virus infection. Diagnoses of Post viral debility, Anemia, unspecified type, and Hypocalcemia were also pertinent to this visit.   Doing well overall, I am happy that she is improving.  Hopefully will continue to do well.  At this point, continue with rest, slow rehab/building endurance.  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  Goals include obviously being off of supplemental oxygen and also able to walk for 6 minutes without desaturation.  Given clinical improvement I do not see benefit in repeating chest x-ray at this time, would have a low threshold for repeat imaging however.   There are no Patient Instructions on file for this visit.  Orders Placed This Encounter  Procedures  . CBC with Differential/Platelet  . COMPLETE METABOLIC PANEL WITH GFR    No orders of the defined types were placed in this encounter.      Follow-up instructions: Return in about 2 weeks (around 09/19/2020) for VIRTUAL VISIT recheck on Covid recovery.  Please see [place] sooner if  needed..                                         BP 119/80 (BP Location: Left Arm, Patient Position: Sitting)   Pulse (!) 41   SpO2 100%   Current Meds  Medication Sig  . acetaminophen (TYLENOL) 500 MG tablet Take 1,000 mg by mouth every 8 (eight) hours as needed for moderate pain.   Marland Kitchen albuterol (PROAIR HFA) 108 (90 Base) MCG/ACT inhaler INHALE 1 TO 2 PUFFS BY MOUTH INTO THE LUNGS EVERY 4 HOURS AS NEEDED FOR WHEEZING OR SHORTNESS OF BREATH  . ARIPiprazole (ABILIFY) 5 MG tablet Take 5 mg by mouth at bedtime.   Marland Kitchen ascorbic acid (VITAMIN C) 500 MG tablet Take 1,000 mg by mouth every evening.   . Benzphetamine HCl 50 MG TABS Take 50 mg by mouth daily.  Marland Kitchen buPROPion (WELLBUTRIN XL) 300 MG 24 hr tablet Take 1 tablet (300 mg total) by mouth daily.  . cefdinir (OMNICEF) 300 MG capsule Take 1 capsule (300 mg total) by mouth 2 (two) times daily.  . Cholecalciferol (VITAMIN D) 125 MCG (5000 UT) CAPS Take 5,000 Units by mouth every evening.   . clonazePAM (KLONOPIN) 0.5 MG tablet Take 0.5 mg by mouth at bedtime.   . Coenzyme Q10 (CO Q-10) 200 MG CAPS Take 200 mg by mouth every evening.   . fexofenadine (ALLEGRA) 180 MG  tablet Take 180 mg by mouth every evening.   Marland Kitchen GLUCOSAMINE-CHONDROITIN PO Take 2 tablets by mouth every evening.   Marland Kitchen guaiFENesin-dextromethorphan (ROBITUSSIN DM) 100-10 MG/5ML syrup Take 10 mLs by mouth every 4 (four) hours as needed for cough.  Marland Kitchen levofloxacin (LEVAQUIN) 750 MG tablet Take 1 tablet (750 mg total) by mouth daily.  . Omega-3 Fatty Acids (FISH OIL) 1200 MG CAPS Take 1,200 mg by mouth every evening.   Marland Kitchen omeprazole (PRILOSEC) 20 MG capsule Take 1 capsule (20 mg total) by mouth daily. (Patient taking differently: Take 20 mg by mouth every evening. )  . ondansetron (ZOFRAN-ODT) 4 MG disintegrating tablet Take 1 tablet (4 mg total) by mouth every 8 (eight) hours as needed for nausea or vomiting.  . phentermine (ADIPEX-P) 37.5 MG tablet Take 75  mg by mouth daily.   . predniSONE (DELTASONE) 10 MG tablet 4 tabs for 2 days 3 tabs for 2 days 2 tabs for 2 days 1 tab for 2 days and continue your current therapy  . SAXENDA 18 MG/3ML SOPN Inject 3 mg into the skin daily.   . simvastatin (ZOCOR) 20 MG tablet TAKE 1 TABLET (20 MG TOTAL) BY MOUTH DAILY. (Patient taking differently: Take 20 mg by mouth at bedtime. )  . tamsulosin (FLOMAX) 0.4 MG CAPS capsule Take 0.4 mg by mouth at bedtime.  . tolterodine (DETROL LA) 4 MG 24 hr capsule Take 4 mg by mouth daily.  . vitamin B-12 (CYANOCOBALAMIN) 1000 MCG tablet Take 2,000 mcg by mouth every evening.   . Vitamin E 180 MG (400 UNIT) CAPS Take 400 Units by mouth every evening.     No results found for this or any previous visit (from the past 72 hour(s)).  No results found.     All questions at time of visit were answered - patient instructed to contact office with any additional concerns or updates.  ER/RTC precautions were reviewed with the patient as applicable.   Please note: voice recognition software was used to produce this document, and typos may escape review. Please contact Dr. Sheppard Coil for any needed clarifications.

## 2020-09-06 ENCOUNTER — Telehealth: Payer: Self-pay | Admitting: Osteopathic Medicine

## 2020-09-06 NOTE — Telephone Encounter (Signed)
Pt husband dropped off paperwork that has to be finished before the 30th. It was left in your basket. Covering for [place] in AM.

## 2020-09-09 MED FILL — ARIPiprazole 5 MG TABS: 5 | 30 days supply | Qty: 30 | Fill #1

## 2020-09-09 MED FILL — OMEPRAZOLE DR 20 MG CAPSULE: 20 | 90 days supply | Qty: 90 | Fill #1

## 2020-09-11 ENCOUNTER — Telehealth: Payer: Self-pay

## 2020-09-11 NOTE — Telephone Encounter (Signed)
Form is for FMLA/Work accomodation.  Since I have never seen her, and don't know the background for her needing this I think this would be most appropriate for Dr. Sheppard Coil to complete upon her return next week.  Please see if patient can ask for extension of due date for this.

## 2020-09-11 NOTE — Telephone Encounter (Signed)
Advised Mary Stark that Dr. Sheppard Coil would be unavailable until next week to complete her FMLA documentation. Suggested she request and extension of the Due Date to accommodate. She expressed understanding.

## 2020-09-11 NOTE — Telephone Encounter (Signed)
Patient called this am stating she needs her FMLA paper work completed and sent to off. Patient is aware Dr. Sheppard Coil is out of the office until Monday. The paperwork is due  09/31/2021.

## 2020-09-12 ENCOUNTER — Ambulatory Visit (INDEPENDENT_AMBULATORY_CARE_PROVIDER_SITE_OTHER): Payer: No Typology Code available for payment source | Admitting: Nurse Practitioner

## 2020-09-12 ENCOUNTER — Encounter: Payer: Self-pay | Admitting: Nurse Practitioner

## 2020-09-12 VITALS — BP 129/82 | HR 70 | Temp 97.9°F | Ht 66.0 in | Wt 207.6 lb

## 2020-09-12 DIAGNOSIS — R600 Localized edema: Secondary | ICD-10-CM | POA: Diagnosis not present

## 2020-09-12 DIAGNOSIS — U071 COVID-19: Secondary | ICD-10-CM

## 2020-09-12 DIAGNOSIS — N289 Disorder of kidney and ureter, unspecified: Secondary | ICD-10-CM | POA: Diagnosis not present

## 2020-09-12 DIAGNOSIS — E871 Hypo-osmolality and hyponatremia: Secondary | ICD-10-CM

## 2020-09-12 DIAGNOSIS — J9601 Acute respiratory failure with hypoxia: Secondary | ICD-10-CM

## 2020-09-12 DIAGNOSIS — I10 Essential (primary) hypertension: Secondary | ICD-10-CM

## 2020-09-12 NOTE — Progress Notes (Signed)
Acute Office Visit  Subjective:    Patient ID: Mary Stark, female    DOB: 1956-08-29, 64 y.o.   MRN: [id number]  Chief Complaint  Patient presents with  . Leg Swelling    onset 2 days ago, bilateral with L>R, has not been taking anything to help, has not been keeping legs elevated, wore compression stockings yesterday but as soon as she took them off the swelling returned    HPI Mary Stark is a 64 year old female presenting today for bilateral lower extremity edema that started this past Monday night.  She reports she had a CT with contrast on Monday morning with [facility] to monitor the presence of 4 stents in her ureters.  She reports following the CT scan she started to develop abdominal pain and cramping which went on throughout the night.  She also reports that when she has kidney pain it is typically in the abdominal area and feels similar to how this felt after the CT.  She denies any fevers, chills, nausea, vomiting, diarrhea.  She denies any worsening shortness of breath, crackles, wheezing, or productive cough. She is on 2 to 3 L of oxygen intermittently throughout the day and consistently throughout the night due to recent COVID-19 infection.  She does endorse very small amounts of urine and she tells me that she feels like she is not urinating enough.  She reports that her urine was very dark this morning.  She reports that the swelling in her lower extremities is a little better in the morning when she wakes up but is not completely resolved. She reports that she wore compression stockings 20 mm yesterday and she feels that that is very helpful but as soon as she takes the stockings off she tells me that when she took them off she could literally watch the fluid build back up in her legs.  She also reports that she has been moving around much slower and is limited in her movement since her COVID-19 infection as she does get short of breath with movement and very  fatigued with very little activity.  Past Medical History:  Diagnosis Date  . Alcohol dependence ([place])   . Anxiety   . Arthritis    Back and lt knee  . Cancer ([place])    skin on nose  . Depression 07/17/2013  . GERD (gastroesophageal reflux disease)   . History of hepatitis C    TX FOR 2012  . History of kidney stones   . History of rheumatic fever AS CHILD   NO PROBLEMS FROM  . Hyperlipidemia 07/17/2013  . Hypertension   . Seasonal allergies   . Toe fracture, right 10/07/2016  . Vitamin D deficiency 07/17/2013    Past Surgical History:  Procedure Laterality Date  . ANAL RECTAL MANOMETRY N/A 01/31/2020   Procedure: ANO RECTAL MANOMETRY;  Surgeon: Arta Silence, MD;  Location: [facility];  Service: Endoscopy;  Laterality: N/A;  . [place]  . CYSTOSCOPY WITH RETROGRADE PYELOGRAM, URETEROSCOPY AND STENT PLACEMENT Bilateral 06/11/2020   Procedure: CYSTOSCOPY WITH RETROGRADE PYELOGRAM, URETEROSCOPY AND STENT PLACEMENT, RIGHT URETERAL BIOPSY;  Surgeon: Robley Fries, MD;  Location: [facility];  Service: Urology;  Laterality: Bilateral;  90 MINS  . CYSTOSCOPY WITH RETROGRADE PYELOGRAM, URETEROSCOPY AND STENT PLACEMENT Bilateral 07/05/2020   Procedure: CYSTOSCOPY WITH RETROGRADE PYELOGRAM, URETEROSCOPY,  BIOPSIES AND STENT EXCHANGES;  Surgeon: Robley Fries, MD;  Location: [facility];  Service: Urology;  Laterality: Bilateral;  .  FINGER SURGERY Right    5th  . GYNECOLOGIC CRYOSURGERY  YRS AGO  . TONSILLECTOMY  AS CHILD    Family History  Problem Relation Age of Onset  . Ovarian cancer Mother   . Cardiomyopathy Father   . Valvular heart disease Father   . Liver cancer Brother   . Protein C deficiency Brother   . Protein C deficiency Brother   . Stroke Brother     Social History   Socioeconomic History  . Marital status: Married    Spouse name: Not on file  . Number of children: Not on file  . Years of education: Not on file  . Highest  education level: Not on file  Occupational History  . Not on file  Tobacco Use  . Smoking status: Former Smoker    Packs/day: 1.00    Years: 25.00    Pack years: 25.00    Types: Cigarettes    Quit date: 2012    Years since quitting: 9.7  . Smokeless tobacco: Never Used  Vaping Use  . Vaping Use: Every day  . Substances: Nicotine  Substance and Sexual Activity  . Alcohol use: Not Currently    Alcohol/week: 24.0 - 36.0 standard drinks    Types: 24 - 36 Cans of beer per week    Comment: Pt reports that she stopped march 2021  . Drug use: No  . Sexual activity: Yes    Birth control/protection: None  Other Topics Concern  . Not on file  Social History Narrative  . Not on file   Social Determinants of Health   Financial Resource Strain:   . Difficulty of Paying Living Expenses: Not on file  Food Insecurity:   . Worried About Charity fundraiser in the Last Year: Not on file  . Ran Out of Food in the Last Year: Not on file  Transportation Needs:   . Lack of Transportation (Medical): Not on file  . Lack of Transportation (Non-Medical): Not on file  Physical Activity:   . Days of Exercise per Week: Not on file  . Minutes of Exercise per Session: Not on file  Stress:   . Feeling of Stress : Not on file  Social Connections:   . Frequency of Communication with Friends and Family: Not on file  . Frequency of Social Gatherings with Friends and Family: Not on file  . Attends Religious Services: Not on file  . Active Member of Clubs or Organizations: Not on file  . Attends Archivist Meetings: Not on file  . Marital Status: Not on file  Intimate Partner Violence:   . Fear of Current or Ex-Partner: Not on file  . Emotionally Abused: Not on file  . Physically Abused: Not on file  . Sexually Abused: Not on file    Outpatient Medications Prior to Visit  Medication Sig Dispense Refill  . acetaminophen (TYLENOL) 500 MG tablet Take 1,000 mg by mouth every 8 (eight) hours  as needed for moderate pain.     Marland Kitchen albuterol (PROAIR HFA) 108 (90 Base) MCG/ACT inhaler INHALE 1 TO 2 PUFFS BY MOUTH INTO THE LUNGS EVERY 4 HOURS AS NEEDED FOR WHEEZING OR SHORTNESS OF BREATH 8.5 g 1  . amLODipine (NORVASC) 5 MG tablet TAKE 1 TABLET BY MOUTH ONCE DAILY (Patient taking differently: Take 5 mg by mouth every evening. ) 90 tablet 1  . ARIPiprazole (ABILIFY) 5 MG tablet Take 5 mg by mouth at bedtime.     Marland Kitchen  ascorbic acid (VITAMIN C) 500 MG tablet Take 1,000 mg by mouth every evening.     . ASPIRIN LOW DOSE 81 MG EC tablet TAKE 1 TABLET BY MOUTH DAILY. (Patient taking differently: Take 81 mg by mouth daily. ) 90 tablet 3  . Benzphetamine HCl 50 MG TABS Take 50 mg by mouth daily.    Marland Kitchen buPROPion (WELLBUTRIN XL) 300 MG 24 hr tablet Take 1 tablet (300 mg total) by mouth daily. 90 tablet 1  . BYSTOLIC 20 MG TABS TAKE 1 TABLET (20 MG TOTAL) BY MOUTH DAILY. (Patient taking differently: Take 20 mg by mouth daily. ) 90 tablet 0  . cefdinir (OMNICEF) 300 MG capsule Take 1 capsule (300 mg total) by mouth 2 (two) times daily. 14 capsule 0  . Cholecalciferol (VITAMIN D) 125 MCG (5000 UT) CAPS Take 5,000 Units by mouth every evening.     . clonazePAM (KLONOPIN) 0.5 MG tablet Take 0.5 mg by mouth at bedtime.     . Coenzyme Q10 (CO Q-10) 200 MG CAPS Take 200 mg by mouth every evening.     . fexofenadine (ALLEGRA) 180 MG tablet Take 180 mg by mouth every evening.     . gabapentin (NEURONTIN) 300 MG capsule 1  qam   4   qhs (Patient taking differently: Take 300-1,200 mg by mouth See admin instructions. Take 300 mg by mouth in the morning and afternoon and 1200 mg at night) 150 capsule 5  . GLUCOSAMINE-CHONDROITIN PO Take 2 tablets by mouth every evening.     Marland Kitchen guaiFENesin-dextromethorphan (ROBITUSSIN DM) 100-10 MG/5ML syrup Take 10 mLs by mouth every 4 (four) hours as needed for cough. 473 mL 0  . levofloxacin (LEVAQUIN) 750 MG tablet Take 1 tablet (750 mg total) by mouth daily. 7 tablet 0  . Omega-3 Fatty  Acids (FISH OIL) 1200 MG CAPS Take 1,200 mg by mouth every evening.     Marland Kitchen omeprazole (PRILOSEC) 20 MG capsule Take 1 capsule (20 mg total) by mouth daily. (Patient taking differently: Take 20 mg by mouth every evening. ) 90 capsule 1  . ondansetron (ZOFRAN-ODT) 4 MG disintegrating tablet Take 1 tablet (4 mg total) by mouth every 8 (eight) hours as needed for nausea or vomiting. 12 tablet 0  . phentermine (ADIPEX-P) 37.5 MG tablet Take 75 mg by mouth daily.     . predniSONE (DELTASONE) 10 MG tablet 4 tabs for 2 days 3 tabs for 2 days 2 tabs for 2 days 1 tab for 2 days and continue your current therapy 20 tablet 0  . SAXENDA 18 MG/3ML SOPN Inject 3 mg into the skin daily.     . simvastatin (ZOCOR) 20 MG tablet TAKE 1 TABLET (20 MG TOTAL) BY MOUTH DAILY. (Patient taking differently: Take 20 mg by mouth at bedtime. ) 90 tablet 1  . tamsulosin (FLOMAX) 0.4 MG CAPS capsule Take 0.4 mg by mouth at bedtime.    . tolterodine (DETROL LA) 4 MG 24 hr capsule Take 4 mg by mouth daily.    . vitamin B-12 (CYANOCOBALAMIN) 1000 MCG tablet Take 2,000 mcg by mouth every evening.     . Vitamin E 180 MG (400 UNIT) CAPS Take 400 Units by mouth every evening.      No facility-administered medications prior to visit.    Allergies  Allergen Reactions  . Metformin And Related Other (See Comments)    Lactic acidosis   . Celebrex [Celecoxib] Other (See Comments)    Dizziness, "makes me feel bad"  .  Penicillins Hives and Rash    Has patient had a PCN reaction causing immediate rash, facial/tongue/throat swelling, SOB or lightheadedness with hypotension: Yes Has patient had a PCN reaction causing severe rash involving mucus membranes or skin necrosis:No Has patient had a PCN reaction that required hospitalization: Yes Has patient had a PCN reaction occurring within the last 10 years: No If all of the above answers are "NO", then may proceed with Cephalosporin use.     Review of Systems See HPI for pertinent  positives and negatives.    Objective:    Physical Exam Vitals and nursing note reviewed.  Constitutional:      Appearance: She is obese. She is ill-appearing.  Eyes:     Extraocular Movements: Extraocular movements intact.     Conjunctiva/sclera: Conjunctivae normal.     Pupils: Pupils are equal, round, and reactive to light.  Cardiovascular:     Rate and Rhythm: Normal rate and regular rhythm.     Pulses: Normal pulses.     Heart sounds: Normal heart sounds.  Neurological:     Mental Status: She is alert.     BP 129/82   Pulse 70   Temp 97.9 F (36.6 C) (Oral)   Ht 5\' 6"  (1.676 m)   Wt 207 lb 9.6 oz (94.2 kg)   SpO2 100% Comment: O2 at 3L/min  BMI 33.51 kg/m  Wt Readings from Last 3 Encounters:  09/12/20 207 lb 9.6 oz (94.2 kg)  08/29/20 196 lb (88.9 kg)  08/28/20 196 lb (88.9 kg)    There are no preventive care reminders to display for this patient.  There are no preventive care reminders to display for this patient.   No results found for: TSH Lab Results  Component Value Date   WBC 10.4 09/05/2020   HGB 9.3 (L) 09/05/2020   HCT 28.5 (L) 09/05/2020   MCV 91.6 09/05/2020   PLT 313 09/05/2020   Lab Results  Component Value Date   NA 135 09/05/2020   K 4.4 09/05/2020   CO2 27 09/05/2020   GLUCOSE 90 09/05/2020   BUN 18 09/05/2020   CREATININE 1.00 (H) 09/05/2020   BILITOT 0.2 09/05/2020   ALKPHOS 59 08/29/2020   AST 11 09/05/2020   ALT 9 09/05/2020   PROT 6.6 09/05/2020   ALBUMIN 2.6 (L) 08/29/2020   CALCIUM 8.5 (L) 09/05/2020   ANIONGAP 12 08/29/2020   Lab Results  Component Value Date   CHOL 179 05/16/2019   Lab Results  Component Value Date   HDL 57 05/16/2019   Lab Results  Component Value Date   LDLCALC 98 05/16/2019   Lab Results  Component Value Date   TRIG 194 (H) 08/19/2020   Lab Results  Component Value Date   CHOLHDL 3.1 05/16/2019   Lab Results  Component Value Date   HGBA1C 6.0 (H) 08/20/2020       Assessment &  Plan:   Problem List Items Addressed This Visit      Cardiovascular and Mediastinum   Essential hypertension    History of hypertension.  Appears to be well controlled today.        Respiratory   Acute hypoxemic respiratory failure due to COVID-19 [facility])    Acute respiratory failure due to COVID-19.  Patient is still requiring supplemental oxygenation of 2 to 3 L during the day intermittently and 2 to 3 L persistently at night. Plan to continue oxygen as needed.        Genitourinary  Renal insufficiency    History of renal insufficiency with the placement of 4 ureteral stents, 2 on the left and 2 on the right. Suspect recent CT with contrast may have exacerbated kidney dysfunction due to patient's presentation and symptoms. Will obtain labs today to determine kidney function.        Other   Hyponatremia    Given recent lower extremity edema will obtain labs today to evaluate electrolyte balances and kidney function to determine if this could be a cause of the lower extremity edema.      Bilateral lower extremity edema - Primary    Bilateral lower extremity edema in the presence of kidney dysfunction, history of electrolyte dysfunction, recent decrease in albumin, recent CT with contrast, and recent illness with COVID-19. We will obtain labs today to evaluate renal, liver, albumin, and electrolyte function.  We will also monitor CBC count to evaluate white blood cell counts.  We will also monitor BNP for possible heart failure. If labs do not show abnormality this could be due to venous stasis in that event we will plan to start Lasix as tolerated with frequent labs. For now plan to wear compression stockings during waking hours and elevate feet when seated.  Continue to move around as tolerated-increase activity will help move the fluid from the lower extremities back up to the heart.  Reduce sodium intake for now and monitor fluid intake. We will notify patient of lab results and  make changes to the plan of care accordingly. Follow-up in 2 weeks for lower extremity edema if lab results are normal-if lab results are abnormal will most likely need follow-up sooner.      Relevant Orders   CBC with Differential/Platelet   COMPLETE METABOLIC PANEL WITH GFR   B Nat Peptide    No changes to medication today.  Follow-up in 2 weeks if lab results are normal. Follow-up in 1 week if lab results show abnormality.  Orma Render, NP

## 2020-09-12 NOTE — Assessment & Plan Note (Signed)
Given recent lower extremity edema will obtain labs today to evaluate electrolyte balances and kidney function to determine if this could be a cause of the lower extremity edema.

## 2020-09-12 NOTE — Assessment & Plan Note (Signed)
History of renal insufficiency with the placement of 4 ureteral stents, 2 on the left and 2 on the right. Suspect recent CT with contrast may have exacerbated kidney dysfunction due to patient's presentation and symptoms. Will obtain labs today to determine kidney function.

## 2020-09-12 NOTE — Patient Instructions (Addendum)
Edema  Edema is when you have too much fluid in your body or under your skin. Edema may make your legs, feet, and ankles swell up. Swelling is also common in looser tissues, like around your eyes. This is a common condition. It gets more common as you get older. There are many possible causes of edema. Eating too much salt (sodium) and being on your feet or sitting for a long time can cause edema in your legs, feet, and ankles. Hot weather may make edema worse. Edema is usually painless. Your skin may look swollen or shiny. Follow these instructions at home:  Keep the swollen body part raised (elevated) above the level of your heart when you are sitting or lying down.  Do not sit still or stand for a long time.  Do not wear tight clothes. Do not wear garters on your upper legs.  Exercise your legs. This can help the swelling go down.  Wear elastic bandages or support stockings as told by your doctor.  Eat a low-salt (low-sodium) diet to reduce fluid as told by your doctor.  Depending on the cause of your swelling, you may need to limit how much fluid you drink (fluid restriction).  Take over-the-counter and prescription medicines only as told by your doctor. Contact a doctor if:  Treatment is not working.  You have heart, liver, or kidney disease and have symptoms of edema.  You have sudden and unexplained weight gain. Get help right away if:  You have shortness of breath or chest pain.  You cannot breathe when you lie down.  You have pain, redness, or warmth in the swollen areas.  You have heart, liver, or kidney disease and get edema all of a sudden.  You have a fever and your symptoms get worse all of a sudden. Summary  Edema is when you have too much fluid in your body or under your skin.  Edema may make your legs, feet, and ankles swell up. Swelling is also common in looser tissues, like around your eyes.  Raise (elevate) the swollen body part above the level of your  heart when you are sitting or lying down.  Follow your doctor's instructions about diet and how much fluid you can drink (fluid restriction). This information is not intended to replace advice given to you by your health care provider. Make sure you discuss any questions you have with your health care provider. Document Revised: 12/03/2017 Document Reviewed: 12/18/2016 Elsevier Patient Education  2020 [employer]  

## 2020-09-12 NOTE — Assessment & Plan Note (Signed)
Acute respiratory failure due to COVID-19.  Patient is still requiring supplemental oxygenation of 2 to 3 L during the day intermittently and 2 to 3 L persistently at night. Plan to continue oxygen as needed.

## 2020-09-12 NOTE — Assessment & Plan Note (Signed)
Bilateral lower extremity edema in the presence of kidney dysfunction, history of electrolyte dysfunction, recent decrease in albumin, recent CT with contrast, and recent illness with COVID-19. We will obtain labs today to evaluate renal, liver, albumin, and electrolyte function.  We will also monitor CBC count to evaluate white blood cell counts.  We will also monitor BNP for possible heart failure. If labs do not show abnormality this could be due to venous stasis in that event we will plan to start Lasix as tolerated with frequent labs. For now plan to wear compression stockings during waking hours and elevate feet when seated.  Continue to move around as tolerated-increase activity will help move the fluid from the lower extremities back up to the heart.  Reduce sodium intake for now and monitor fluid intake. We will notify patient of lab results and make changes to the plan of care accordingly. Follow-up in 2 weeks for lower extremity edema if lab results are normal-if lab results are abnormal will most likely need follow-up sooner.

## 2020-09-12 NOTE — Assessment & Plan Note (Signed)
History of hypertension.  Appears to be well controlled today.

## 2020-09-13 ENCOUNTER — Other Ambulatory Visit: Payer: Self-pay | Admitting: Nurse Practitioner

## 2020-09-13 DIAGNOSIS — R600 Localized edema: Secondary | ICD-10-CM

## 2020-09-13 LAB — COMPLETE METABOLIC PANEL WITH GFR
AG Ratio: 1 (calc) (ref 1.0–2.5)
ALT: 16 U/L (ref 6–29)
AST: 14 U/L (ref 10–35)
Albumin: 3.3 g/dL — ABNORMAL LOW (ref 3.6–5.1)
Alkaline phosphatase (APISO): 150 U/L (ref 37–153)
BUN: 12 mg/dL (ref 7–25)
CO2: 23 mmol/L (ref 20–32)
Calcium: 8.3 mg/dL — ABNORMAL LOW (ref 8.6–10.4)
Chloride: 103 mmol/L (ref 98–110)
Creat: 0.99 mg/dL (ref 0.50–0.99)
GFR, Est African American: 70 mL/min/{1.73_m2} (ref >=60)
GFR, Est Non African American: 61 mL/min/{1.73_m2} (ref >=60)
Globulin: 3.3 g/dL (calc) (ref 1.9–3.7)
Glucose, Bld: 82 mg/dL (ref 65–139)
Potassium: 4.4 mmol/L (ref 3.5–5.3)
Sodium: 135 mmol/L (ref 135–146)
Total Bilirubin: 0.3 mg/dL (ref 0.2–1.2)
Total Protein: 6.6 g/dL (ref 6.1–8.1)

## 2020-09-13 LAB — BRAIN NATRIURETIC PEPTIDE: Brain Natriuretic Peptide: 89 pg/mL (ref <100)

## 2020-09-13 LAB — CBC WITH DIFFERENTIAL/PLATELET
Absolute Monocytes: 1069 cells/uL — ABNORMAL HIGH (ref 200–950)
Basophils Absolute: 73 cells/uL (ref 0–200)
Basophils Relative: 0.9 %
Eosinophils Absolute: 243 cells/uL (ref 15–500)
Eosinophils Relative: 3 %
HCT: 28 % — ABNORMAL LOW (ref 35.0–45.0)
Hemoglobin: 9.2 g/dL — ABNORMAL LOW (ref 11.7–15.5)
Lymphs Abs: 1733 cells/uL (ref 850–3900)
MCH: 29.8 pg (ref 27.0–33.0)
MCHC: 32.9 g/dL (ref 32.0–36.0)
MCV: 90.6 fL (ref 80.0–100.0)
MPV: 9.8 fL (ref 7.5–12.5)
Monocytes Relative: 13.2 %
Neutro Abs: 4982 cells/uL (ref 1500–7800)
Neutrophils Relative %: 61.5 %
Platelets: 423 10*3/uL — ABNORMAL HIGH (ref 140–400)
RBC: 3.09 10*6/uL — ABNORMAL LOW (ref 3.80–5.10)
RDW: 13.9 % (ref 11.0–15.0)
Total Lymphocyte: 21.4 %
WBC: 8.1 10*3/uL (ref 3.8–10.8)

## 2020-09-13 MED ORDER — FUROSEMIDE 20 MG PO TABS: ORAL_TABLET | ORAL | 3 refills | Status: DC

## 2020-09-13 NOTE — Progress Notes (Signed)
Orders for lasix 20mg  today and tomorrow then start every other day dosing for lower extremity edema. Plan to recheck labs in 1 week to monitor kidney and electrolyte function and monitor hemoglobin and albumin levels.

## 2020-09-13 NOTE — Progress Notes (Signed)
Hi Cameryn,   I am still waiting on the BNP to result, but your CBC and CMP have returned. It looks like your hemoglobin is stable right now, so we will just plan to watch this. You did have a very slight increase in platelets, we can watch this, as well.   Your albumin is still low, but this is likely due to being so ill recently- we will watch this carefully as you recover and make sure that we see an increase. This will definitely cause increased edema.   Your kidney function and electrolytes look pretty stable, as well. I don't want to hit your kidneys hard, but do with diuretics, but I think we would be fine to do lasix 20mg  today and tomorrow and then start every other day dosing.   Lets plan to have you come in for labs only next Thursday just to recheck and make sure everything is looking stable or improving. I will put the orders in now and you can just come to the lab.   Keep wearing the compression stockings and keeping your feet elevated. You can also work on doing "bicycle exercises" with your legs when laying in bed and seated to help with the flow of fluid.   I have sent the lasix to the [facility] on file.   Let me know if you have any questions.   SaraBeth

## 2020-09-13 NOTE — Progress Notes (Signed)
[place],   Your BNP came back within normal range. I feel that with the information we have, it is likely that your symptoms are a result of your low albumin levels. Hopefully if we can get your albumin up and use the lasix, we can get some of the excess fluid off.

## 2020-09-16 ENCOUNTER — Encounter: Payer: Self-pay | Admitting: Nurse Practitioner

## 2020-09-17 ENCOUNTER — Other Ambulatory Visit (HOSPITAL_COMMUNITY): Payer: Self-pay

## 2020-09-17 MED FILL — DULoxetine HCL 30 MG CPEP: 30 | 30 days supply | Qty: 30 | Fill #0

## 2020-09-18 ENCOUNTER — Other Ambulatory Visit: Payer: Self-pay | Admitting: *Deleted

## 2020-09-18 ENCOUNTER — Other Ambulatory Visit: Payer: Self-pay | Admitting: Osteopathic Medicine

## 2020-09-18 MED FILL — TAMSULOSIN HCL 0.4 MG CAP: 0.4 | 30 days supply | Qty: 30 | Fill #1

## 2020-09-18 NOTE — Patient Outreach (Addendum)
Mary Stark [facility]) Care Management  09/18/2020  Mary Stark 12-May-1956 [id number]   Red on EMMI alert / Follow up call Day: 4 Date: 08/26/20 Reason : Sad/hopeless/anxious/empty? Yes   Subjective Follow up call to patient .  Unsuccessful outreach call to patient , no answer able to leave a HIPAA compliant message for return call.   Objective: [name] hospitalized [facility] From 9/6-08/21/20 for Acute Respiratory failure, Covid 19 PneumoniaComorbidities include: Hypertension, hyperlipidemia, stage 3 CKD, treated hepatitis, hx of bilateral hydronephrosis and bilateral stents .  Shewas discharged to home on 9/8/21without the need for home health services, discharged home with home oxygen.  Plan Will plan follow up call in the next 4 business days , if no return call on today.   1600 Return call from patient , she discussed making slow steady progress, she continues to use oxygen at 2.5 liters when active and on room air at rest. She is discussed making first trip to grocery store on yesterday, and complains of legs being weak. She discussed having swelling in her legs due to low albumin and taking lasix qod, reports good output on some days better and others. She reports having follow up visit with PCP on tomorrow. Discussed follow up on employee assistance counseling program,   She  reports participating in craft of quilting to help with coping.  She denies any other nursing care coordination needs at this time.  Will close case to [facility] care management .     Joylene Draft, RN, BSN  [facility] Management Coordinator  [phone]- Mobile [phone]- Toll Free Main Office

## 2020-09-19 ENCOUNTER — Telehealth (INDEPENDENT_AMBULATORY_CARE_PROVIDER_SITE_OTHER): Payer: No Typology Code available for payment source | Admitting: Osteopathic Medicine

## 2020-09-19 ENCOUNTER — Ambulatory Visit (INDEPENDENT_AMBULATORY_CARE_PROVIDER_SITE_OTHER): Payer: No Typology Code available for payment source

## 2020-09-19 ENCOUNTER — Other Ambulatory Visit: Payer: Self-pay

## 2020-09-19 ENCOUNTER — Other Ambulatory Visit: Payer: Self-pay | Admitting: Osteopathic Medicine

## 2020-09-19 DIAGNOSIS — R809 Proteinuria, unspecified: Secondary | ICD-10-CM

## 2020-09-19 DIAGNOSIS — E8809 Other disorders of plasma-protein metabolism, not elsewhere classified: Secondary | ICD-10-CM

## 2020-09-19 DIAGNOSIS — R600 Localized edema: Secondary | ICD-10-CM | POA: Diagnosis not present

## 2020-09-19 MED FILL — SIMVASTATIN 20 MG TABLET: 20 | 90 days supply | Qty: 90 | Fill #0

## 2020-09-19 NOTE — Progress Notes (Signed)
HPI: Mary Stark is a 64 y.o. female who  has a past medical history of Alcohol dependence ([place]), Anxiety, Arthritis, Cancer ([place]), Depression (07/17/2013), GERD (gastroesophageal reflux disease), History of hepatitis C, History of kidney stones, History of rheumatic fever (AS CHILD), Hyperlipidemia (07/17/2013), Hypertension, Seasonal allergies, Toe fracture, right (10/07/2016), and Vitamin D deficiency (07/17/2013).  she presents to [facility] today, 09/19/20,  for chief complaint of:  Lower extremity edema  Virtual visit transitioned to in-office Edema not better, see notes from most recent visit w/ SaraBeth  No worsening SOB or CP LE edema bothersome, slightly improved w/ Lasix but not much CT reviewed from urology - concerning lymphadenopathy and enhancement of renal solelcting system concerning for malignancy, no definite mass. Pt reports that Dr Claudia Desanctis mentioned an upcoming "meeting" (sounds like tumor board) to determine next steps for biopsy, getting IR involved   See previous notes since 04/2020 but in summation: incidental hydronephrosis on MRI done by ortho --> CT w/ duodenal edema noted --> back and forth w/ GI and Urology --> EGD nonrevealing --> cystoscopy --> ureteral stenting and steroids given --> COVID infection likely d/t immune compromise from steroids -->  Persistent SOB and PNA requiring home O2 --> LE edema, ?cause   On exam, coarse breath sounds but cardiac exam WNL, LE edema +2 pitting bilaterally      ASSESSMENT/PLAN: The primary encounter diagnosis was Edema of left lower extremity. A diagnosis of Hypoalbuminemia was also pertinent to this visit.  Orders Placed This Encounter  Procedures  . US Venous Img Lower Bilateral  . Microalbumin / creatinine urine ratio  . Urinalysis, Routine w reflex microscopic  . COMPLETE METABOLIC PANEL WITH GFR  . CBC  . TSH     No orders of the defined types were placed in this  encounter.   There are no Patient Instructions on file for this visit.    Follow-up plan: Return for RECHECK PENDING RESULTS / IF WORSE OR CHANGE.                                                 ################################################# ################################################# ################################################# #################################################    No outpatient medications have been marked as taking for the 09/19/20 encounter (Video Visit) with [name], DO.    Allergies  Allergen Reactions  . Metformin And Related Other (See Comments)    Lactic acidosis   . Celebrex [Celecoxib] Other (See Comments)    Dizziness, "makes me feel bad"  . Penicillins Hives and Rash    Has patient had a PCN reaction causing immediate rash, facial/tongue/throat swelling, SOB or lightheadedness with hypotension: Yes Has patient had a PCN reaction causing severe rash involving mucus membranes or skin necrosis:No Has patient had a PCN reaction that required hospitalization: Yes Has patient had a PCN reaction occurring within the last 10 years: No If all of the above answers are "NO", then may proceed with Cephalosporin use.        Review of Systems: Pertinent (+) and (-) ROS in HPI as above   Exam:  There were no vitals taken for this visit.  Constitutional: VS see above. General Appearance: alert, well-developed, well-nourished, NAD  Neck: No masses, trachea midline.   Respiratory: Normal respiratory effort. no wheeze, no rhonchi, no rales  Cardiovascular: S1/S2 normal, no murmur, no rub/gallop auscultated. RRR. +  2 LE edema bilaterally   Musculoskeletal: Gait normal.  Abdominal: non-tender  Neurological: Normal balance/coordination. No tremor.  Skin: warm, dry, intact.   Psychiatric: Normal judgment/insight. Normal mood and affect. Oriented x3.       Visit summary with  medication list and pertinent instructions was printed for patient to review, patient was advised to alert [place] if any updates are needed. All questions at time of visit were answered - patient instructed to contact office with any additional concerns. ER/RTC precautions were reviewed with the patient and understanding verbalized.   Note: Total time spent 30 minutes, greater than 50% of the visit was spent face-to-face counseling and coordinating care for the following: The primary encounter diagnosis was Edema of left lower extremity. A diagnosis of Hypoalbuminemia was also pertinent to this visit.Marland Kitchen     Please note: voice recognition software was used to produce this document, and typos may escape review. Please contact Dr. Sheppard Coil for any needed clarifications.    Follow up plan: Return for RECHECK PENDING RESULTS / IF WORSE OR CHANGE.

## 2020-09-20 NOTE — Addendum Note (Signed)
Addended by: Maryla Morrow on: 09/20/2020 03:09 PM   Modules accepted: Orders

## 2020-09-21 MED FILL — AMLODIPINE BESYLATE 5 MG TA: 5 | 90 days supply | Qty: 90 | Fill #1

## 2020-09-22 ENCOUNTER — Emergency Department
Admission: EM | Admit: 2020-09-22 | Discharge: 2020-09-22 | Disposition: A | Discharge status: Home / Self Care | Payer: No Typology Code available for payment source | Source: Home / Self Care | Attending: Family Medicine | Admitting: Family Medicine

## 2020-09-22 ENCOUNTER — Other Ambulatory Visit: Payer: Self-pay

## 2020-09-22 DIAGNOSIS — R197 Diarrhea, unspecified: Secondary | ICD-10-CM

## 2020-09-22 DIAGNOSIS — R1033 Periumbilical pain: Secondary | ICD-10-CM

## 2020-09-22 DIAGNOSIS — R1011 Right upper quadrant pain: Secondary | ICD-10-CM

## 2020-09-22 LAB — POCT CBC W AUTO DIFF (K'VILLE URGENT CARE)

## 2020-09-22 LAB — POCT URINALYSIS DIP (MANUAL ENTRY)
Glucose, UA: NEGATIVE mg/dL
Nitrite, UA: NEGATIVE
Protein Ur, POC: 100 mg/dL — AB
Spec Grav, UA: 1.02
Urobilinogen, UA: 0.2 U/dL
pH, UA: 6

## 2020-09-22 NOTE — Discharge Instructions (Addendum)
Begin clear liquids for about 24 hours, then may begin a [employer] (Bananas, Rice, Applesauce, Toast) for about 24 hours when improved. Then gradually advance to a regular diet as tolerated.    If symptoms become significantly worse during the night or over the weekend, proceed to the local emergency room.

## 2020-09-22 NOTE — ED Triage Notes (Signed)
Pt states that she has had some diarrhea x3 days. Pt states that she has some right flank left groin pain. Pt states that she is vaccinated.

## 2020-09-22 NOTE — ED Provider Notes (Signed)
[name]    CSN: [id number] Arrival date & time: 09/22/20  1041      History   Chief Complaint Chief Complaint  Patient presents with  . Diarrhea    HPI Mary Stark is a 64 y.o. female.   Patient complains of 3 day history of loose mucous stools and right upper quadrant and vague right flank pain.  She denies melena and hematochezia.  She has had chills but no fever during the past two days.  Her appetite is poor but she denies nausea/vomiting.  She has had no changes in her urination. She has a history of bilateral ureteral stents and renal insufficiency.  She had COVID pneumonia in September, having been treated also with a course of cefdinir. She has abdominal surgery history of a C-section.  The history is provided by the patient.    Past Medical History:  Diagnosis Date  . Alcohol dependence ([place])   . Anxiety   . Arthritis    Back and lt knee  . Cancer ([place])    skin on nose  . Depression 07/17/2013  . GERD (gastroesophageal reflux disease)   . History of hepatitis C    TX FOR 2012  . History of kidney stones   . History of rheumatic fever AS CHILD   NO PROBLEMS FROM  . Hyperlipidemia 07/17/2013  . Hypertension   . Seasonal allergies   . Toe fracture, right 10/07/2016  . Vitamin D deficiency 07/17/2013    Patient Active Problem List   Diagnosis Date Noted  . Bilateral lower extremity edema 09/12/2020  . Acute hypoxemic respiratory failure due to COVID-19 ([place]) 08/19/2020  . Hyponatremia 08/19/2020  . Tear of medial meniscus of left knee, current 07/13/2019  . Encounter for monitoring NSAID therapy 07/13/2019  . Internal derangement of knee involving posterior horn of lateral meniscus, left 07/13/2019  . Primary osteoarthritis of left knee 07/13/2019  . Renal insufficiency 07/13/2019  . Anxiety about health 03/22/2019  . Hypokalemia 03/21/2019  . Chronic pain of left knee 02/13/2019  . Essential hypertension 02/13/2019  . Liver cyst  04/14/2018  . Anemia due to blood loss, acute 04/14/2018  . Hypomagnesemia 04/14/2018  . Hypocalcemia 04/14/2018  . Tobacco abuse 04/08/2018  . Depression 04/07/2018  . AKI (acute kidney injury) ([place]) 04/07/2018  . Sepsis ([place]) 04/07/2018  . Acute colitis 04/07/2018  . Avascular necrosis of humeral head, right ([place]) 03/30/2018  . NSAID long-term use 03/30/2018  . Encounter for weight loss counseling 03/30/2018  . Seborrheic keratoses 03/15/2018  . History of nonmelanoma skin cancer 03/02/2018  . Skin lesion of chest wall 03/02/2018  . Alcohol dependence with unspecified alcohol-induced disorder ([place]) 02/11/2018  . Severe episode of recurrent major depressive disorder, without psychotic features ([place]) 02/11/2018  . Transaminitis 01/06/2018  . Nicotine dependence 01/06/2018  . Heavy alcohol consumption 01/06/2018  . Encounter for monitoring statin therapy 01/06/2018  . Class 1 obesity due to excess calories with serious comorbidity in adult 01/06/2018  . Chronic pain syndrome 04/21/2017  . Chronic right shoulder pain 07/03/2015  . Vaginismus 04/12/2014  . Menopausal state 11/08/2013  . Family history of ovarian cancer 09/27/2013  . 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 Personal history of colonic polyps 07/18/2013  . Dyslipidemia, goal LDL below 100 07/17/2013  . Depression with anxiety 07/17/2013  . Hx of hepatitis C 07/17/2013  . Vitamin D deficiency 07/17/2013  . Right lumbar radiculopathy 07/17/2013  . History of alcohol dependence ([place]) 07/17/2013  .  H/O: rheumatic fever 06/30/2012  . Insomnia 06/30/2012    Past Surgical History:  Procedure Laterality Date  . ANAL RECTAL MANOMETRY N/A 01/31/2020   Procedure: ANO RECTAL MANOMETRY;  Surgeon: Arta Silence, MD;  [facility];  Service: Endoscopy;  Laterality: N/A;  . [place]  . CYSTOSCOPY WITH RETROGRADE PYELOGRAM, URETEROSCOPY AND STENT PLACEMENT Bilateral 06/11/2020   Procedure:  CYSTOSCOPY WITH RETROGRADE PYELOGRAM, URETEROSCOPY AND STENT PLACEMENT, RIGHT URETERAL BIOPSY;  Surgeon: Robley Fries, MD;  Location: [facility];  Service: Urology;  Laterality: Bilateral;  90 MINS  . CYSTOSCOPY WITH RETROGRADE PYELOGRAM, URETEROSCOPY AND STENT PLACEMENT Bilateral 07/05/2020   Procedure: CYSTOSCOPY WITH RETROGRADE PYELOGRAM, URETEROSCOPY,  BIOPSIES AND STENT EXCHANGES;  Surgeon: Robley Fries, MD;  Location[facility];  Service: Urology;  Laterality: Bilateral;  . FINGER SURGERY Right    5th  . GYNECOLOGIC CRYOSURGERY  YRS AGO  . TONSILLECTOMY  AS CHILD    OB History   No obstetric history on file.      Home Medications    Prior to Admission medications   Medication Sig Start Date End Date Taking? Authorizing Provider  acetaminophen (TYLENOL) 500 MG tablet Take 1,000 mg by mouth every 8 (eight) hours as needed for moderate pain.    Yes [provider]  albuterol (PROAIR HFA) 108 (90 Base) MCG/ACT inhaler INHALE 1 TO 2 PUFFS BY MOUTH INTO THE LUNGS EVERY 4 HOURS AS NEEDED FOR WHEEZING OR SHORTNESS OF BREATH 08/28/20  Yes [name], DO  amLODipine (NORVASC) 5 MG tablet TAKE 1 TABLET BY MOUTH ONCE DAILY Patient taking differently: Take 5 mg by mouth every evening.  06/13/20  Yes [name], DO  ARIPiprazole (ABILIFY) 5 MG tablet Take 5 mg by mouth at bedtime.    Yes [provider]  ascorbic acid (VITAMIN C) 500 MG tablet Take 1,000 mg by mouth every evening.    Yes [provider]  ASPIRIN LOW DOSE 81 MG EC tablet TAKE 1 TABLET BY MOUTH DAILY. Patient taking differently: Take 81 mg by mouth daily.  04/09/20  Yes [name], DO  Benzphetamine HCl 50 MG TABS Take 50 mg by mouth daily.   Yes [provider]  buPROPion (WELLBUTRIN XL) 300 MG 24 hr tablet Take 1 tablet (300 mg total) by mouth daily. 11/22/19  Yes Plovsky, Berneta Sages, MD  BYSTOLIC 20 MG TABS TAKE 1 TABLET (20 MG TOTAL) BY MOUTH  DAILY. Patient taking differently: Take 20 mg by mouth daily.  07/03/20  Yes [name], DO  Cholecalciferol (VITAMIN D) 125 MCG (5000 UT) CAPS Take 5,000 Units by mouth every evening.    Yes [provider]  clonazePAM (KLONOPIN) 0.5 MG tablet Take 0.5 mg by mouth at bedtime.  04/15/20  Yes [provider]  Coenzyme Q10 (CO Q-10) 200 MG CAPS Take 200 mg by mouth every evening.    Yes [provider]  fexofenadine (ALLEGRA) 180 MG tablet Take 180 mg by mouth every evening.    Yes [provider]  furosemide (LASIX) 20 MG tablet Take 20mg  on day 1 and 2 then every other day. 09/13/20  Yes Early, [name], NP  gabapentin (NEURONTIN) 300 MG capsule 1  qam   4   qhs Patient taking differently: Take 300-1,200 mg by mouth See admin instructions. Take 300 mg by mouth in the morning and afternoon and 1200 mg at night 11/22/19  Yes Plovsky, Berneta Sages, MD  GLUCOSAMINE-CHONDROITIN PO Take 2 tablets  by mouth every evening.    Yes [provider]  guaiFENesin-dextromethorphan (ROBITUSSIN DM) 100-10 MG/5ML syrup Take 10 mLs by mouth every 4 (four) hours as needed for cough. 08/28/20  Yes [name], DO  levofloxacin (LEVAQUIN) 750 MG tablet Take 1 tablet (750 mg total) by mouth daily. 08/29/20  Yes [name], DO  Omega-3 Fatty Acids (FISH OIL) 1200 MG CAPS Take 1,200 mg by mouth every evening.    Yes [provider]  omeprazole (PRILOSEC) 20 MG capsule Take 1 capsule (20 mg total) by mouth daily. Patient taking differently: Take 20 mg by mouth every evening.  05/30/20  Yes [name], DO  ondansetron (ZOFRAN-ODT) 4 MG disintegrating tablet Take 1 tablet (4 mg total) by mouth every 8 (eight) hours as needed for nausea or vomiting. 06/03/20  Yes Noe Gens, PA-C  phentermine (ADIPEX-P) 37.5 MG tablet Take 75 mg by mouth daily.    Yes [provider]  SAXENDA 18 MG/3ML SOPN Inject 3 mg into the skin daily.  02/28/19  Yes [provider]  simvastatin (ZOCOR) 20 MG tablet TAKE 1 TABLET (20 MG TOTAL) BY MOUTH DAILY. 09/19/20  Yes [name], DO  tamsulosin (FLOMAX) 0.4 MG CAPS capsule Take 0.4 mg by mouth at bedtime. 08/07/20  Yes [provider]  tolterodine (DETROL LA) 4 MG 24 hr capsule Take 4 mg by mouth daily. 07/12/20  Yes [provider]  vitamin B-12 (CYANOCOBALAMIN) 1000 MCG tablet Take 2,000 mcg by mouth every evening.    Yes [provider]  Vitamin E 180 MG (400 UNIT) CAPS Take 400 Units by mouth every evening.    Yes [provider]  cefdinir (OMNICEF) 300 MG capsule Take 1 capsule (300 mg total) by mouth 2 (two) times daily. 08/29/20   [name], DO  predniSONE (DELTASONE) 10 MG tablet 4 tabs for 2 days 3 tabs for 2 days 2 tabs for 2 days 1 tab for 2 days and continue your current therapy 08/21/20   Barb Merino, MD    Family History Family History  Problem Relation Age of Onset  . Ovarian cancer Mother   . Cardiomyopathy Father   . Valvular heart disease Father   . Liver cancer Brother   . Protein C deficiency Brother   . Protein C deficiency Brother   . Stroke Brother     Social History Social History   Tobacco Use  . Smoking status: Former Smoker    Packs/day: 1.00    Years: 25.00    Pack years: 25.00    Types: Cigarettes    Quit date: 2012    Years since quitting: 9.7  . Smokeless tobacco: Never Used  Vaping Use  . Vaping Use: Every day  . Substances: Nicotine  Substance Use Topics  . Alcohol use: Not Currently    Alcohol/week: 24.0 - 36.0 standard drinks    Types: 24 - 36 Cans of beer per week    Comment: Pt reports that she stopped march 2021  . Drug use: No     Allergies   Metformin and related, Celebrex [celecoxib], and Penicillins   Review of Systems Review of Systems  Constitutional: Positive for activity change, appetite change, chills and fatigue. Negative for diaphoresis and fever.  HENT: Negative.   Eyes:  Negative.   Respiratory: Negative.   Cardiovascular: Negative.   Gastrointestinal: Positive for abdominal pain and diarrhea. Negative for abdominal distention, blood in stool, constipation, nausea, rectal pain and vomiting.  Genitourinary: Positive for flank pain. Negative for decreased urine volume, difficulty urinating, dysuria, frequency, hematuria, pelvic pain, urgency, vaginal discharge and vaginal pain.  Skin: Negative.   Neurological: Negative.      Physical Exam Triage Vital Signs ED Triage Vitals  Enc Vitals Group     BP 09/22/20 1144 (!) 162/100     Pulse Rate 09/22/20 1144 72     Resp --      Temp 09/22/20 1144 98.7 F (37.1 C)     Temp Source 09/22/20 1144 Tympanic     SpO2 09/22/20 1144 100 %     Weight 09/22/20 1139 200 lb (90.7 kg)     Height 09/22/20 1139 5\' 6"  (1.676 m)     Head Circumference --      Peak Flow --      Pain Score 09/22/20 1139 3     Pain Loc --      Pain Edu? --      Excl. in [place]? --    No data found.  Updated Vital Signs BP (!) 162/100   Pulse 72   Temp 98.7 F (37.1 C) (Tympanic)   Ht 5\' 6"  (1.676 m)   Wt 90.7 kg   SpO2 100%   BMI 32.28 kg/m   Visual Acuity Right Eye Distance:   Left Eye Distance:   Bilateral Distance:    Right Eye Near:   Left Eye Near:    Bilateral Near:     Physical Exam Vitals and nursing note reviewed.  Constitutional:      General: She is not in acute distress.    Appearance: She is obese. She is not ill-appearing.  HENT:     Head: Normocephalic.     Right Ear: External ear normal.     Left Ear: External ear normal.     Nose: Nose normal.     Mouth/Throat:     Mouth: Mucous membranes are moist.  Eyes:     Conjunctiva/sclera: Conjunctivae normal.     Pupils: Pupils are equal, round, and reactive to light.  Cardiovascular:     Rate and Rhythm: Normal rate and regular rhythm.     Heart sounds: Normal heart sounds.  Pulmonary:     Breath sounds: Normal breath sounds.  Abdominal:     General:  Bowel sounds are normal. There is no distension.     Palpations: Abdomen is soft. There is no hepatomegaly or splenomegaly.     Tenderness: There is abdominal tenderness in the right upper quadrant and periumbilical area. There is no right CVA tenderness, left CVA tenderness or guarding.     Hernia: There is no hernia in the umbilical area or ventral area.       Comments: There is distinct right upper quadrant tenderness to palpation extending to the umbilicus.  Musculoskeletal:        General: No tenderness.     Cervical back: Neck supple.  Lymphadenopathy:     Cervical: No cervical adenopathy.  Skin:    General: Skin is warm and dry.     Findings: No rash.  Neurological:     Mental Status: She is alert and oriented to person, place, and time.      UC Treatments / Results  Labs (all labs ordered are listed, but only abnormal results are displayed) Labs Reviewed  POCT URINALYSIS DIP (MANUAL ENTRY) - Abnormal; Notable for the following components:      Result Value   Bilirubin, UA small (*)  Ketones, POC UA small (15) (*)    Blood, UA large (*)    Protein Ur, POC =100 (*)    Leukocytes, UA Trace (*)    All other components within normal limits  AMYLASE  LIPASE  COMPLETE METABOLIC PANEL WITH GFR  POCT CBC W AUTO DIFF ([facility]):  WBC 11.9; LY 27.2; MO 8.2; GR 64.6; Hgb 10.2; Platelets 482     EKG   Radiology No results found.  Procedures Procedures (including critical care time)  Medications Ordered in UC Medications - No data to display  Initial Impression / Assessment and Plan / UC Course  I have reviewed the triage vital signs and the nursing notes.  Pertinent labs & imaging results that were available during my care of the patient were reviewed by me and considered in my medical decision making (see chart for details).    Note mild leukocytosis (WBC 11.9) on CBC today. Review of previous urinalysis reports from 08/19/20 09/19/20 reveal microscopic  hematuria, leukocytes, and proteinuria, but no bilirubin.  Note small bilirubin on today's urinalysis. Review of CMP from four and eleven days ago reveals relatively stable renal function, normal LFT's and normal bilirubin. Note distinct right upper quadrant pain/tenderness.  Concern for ?biliary colic/cholecystitis. CMP, amylase, lipase pending. Begin clear liquids.  Followup with Family Doctor as soon as possible.   Final Clinical Impressions(s) / UC Diagnoses   Final diagnoses:  Diarrhea, unspecified type  Periumbilical abdominal pain  Abdominal pain, right upper quadrant     Discharge Instructions     Begin clear liquids for about 24 hours, then may begin a BRAT diet (Bananas, Rice, Applesauce, Toast) for about 24 hours when improved. Then gradually advance to a regular diet as tolerated.    If symptoms become significantly worse during the night or over the weekend, proceed to the local emergency room.    ED Prescriptions    None        Kandra Nicolas, MD 09/23/20 1007

## 2020-09-23 ENCOUNTER — Ambulatory Visit: Payer: Self-pay | Admitting: *Deleted

## 2020-09-23 LAB — COMPLETE METABOLIC PANEL WITH GFR
AG Ratio: 0.9 (calc) — ABNORMAL LOW (ref 1.0–2.5)
ALT: 16 U/L (ref 6–29)
AST: 32 U/L (ref 10–35)
Albumin: 3.4 g/dL — ABNORMAL LOW (ref 3.6–5.1)
Alkaline phosphatase (APISO): 199 U/L — ABNORMAL HIGH (ref 37–153)
BUN: 8 mg/dL (ref 7–25)
CO2: 27 mmol/L (ref 20–32)
Calcium: 8.3 mg/dL — ABNORMAL LOW (ref 8.6–10.4)
Chloride: 99 mmol/L (ref 98–110)
Creat: 0.83 mg/dL (ref 0.50–0.99)
GFR, Est African American: 87 mL/min/{1.73_m2} (ref >=60)
GFR, Est Non African American: 75 mL/min/{1.73_m2} (ref >=60)
Globulin: 3.8 g/dL (calc) — ABNORMAL HIGH (ref 1.9–3.7)
Glucose, Bld: 85 mg/dL (ref 65–99)
Potassium: 4.2 mmol/L (ref 3.5–5.3)
Sodium: 135 mmol/L (ref 135–146)
Total Bilirubin: 0.3 mg/dL (ref 0.2–1.2)
Total Protein: 7.2 g/dL (ref 6.1–8.1)

## 2020-09-23 LAB — AMYLASE: Amylase: 32 U/L (ref 21–101)

## 2020-09-23 LAB — LIPASE: Lipase: 18 U/L (ref 7–60)

## 2020-09-25 ENCOUNTER — Telehealth: Payer: Self-pay

## 2020-09-25 DIAGNOSIS — R93429 Abnormal radiologic findings on diagnostic imaging of unspecified kidney: Secondary | ICD-10-CM

## 2020-09-25 DIAGNOSIS — R809 Proteinuria, unspecified: Secondary | ICD-10-CM

## 2020-09-25 DIAGNOSIS — N289 Disorder of kidney and ureter, unspecified: Secondary | ICD-10-CM

## 2020-09-25 DIAGNOSIS — R590 Localized enlarged lymph nodes: Secondary | ICD-10-CM

## 2020-09-25 DIAGNOSIS — N1330 Unspecified hydronephrosis: Secondary | ICD-10-CM

## 2020-09-25 DIAGNOSIS — R600 Localized edema: Secondary | ICD-10-CM

## 2020-09-25 DIAGNOSIS — E8809 Other disorders of plasma-protein metabolism, not elsewhere classified: Secondary | ICD-10-CM

## 2020-09-25 NOTE — Telephone Encounter (Signed)
Patient left on voicemail that she saw Dr. Leafy Ro today and wants her to get an ultra sound  Due to elevated liver  Enzymes. She wanted her PCP to be aware of what was going on.

## 2020-09-26 NOTE — Telephone Encounter (Signed)
She must have said Dr Assunta Found, meaning urgent care Alk Phos was elevated but AST/ALT were ok, concern for inflammation from my standpoint but [place] seems reasonable.  More importantly, I added on a urine test last week - protein electrophoresis, been almost 7 days and no results yet can we check up on this with the lab? Thanks!

## 2020-09-27 LAB — COMPLETE METABOLIC PANEL WITH GFR
AG Ratio: 1 (calc) (ref 1.0–2.5)
ALT: 6 U/L (ref 6–29)
AST: 12 U/L (ref 10–35)
Albumin: 3.1 g/dL — ABNORMAL LOW (ref 3.6–5.1)
Alkaline phosphatase (APISO): 97 U/L (ref 37–153)
BUN/Creatinine Ratio: 11 (calc) (ref 6–22)
BUN: 12 mg/dL (ref 7–25)
CO2: 23 mmol/L (ref 20–32)
Calcium: 8 mg/dL — ABNORMAL LOW (ref 8.6–10.4)
Chloride: 101 mmol/L (ref 98–110)
Creat: 1.06 mg/dL — ABNORMAL HIGH (ref 0.50–0.99)
GFR, Est African American: 65 mL/min/{1.73_m2} (ref >=60)
GFR, Est Non African American: 56 mL/min/{1.73_m2} — ABNORMAL LOW (ref >=60)
Globulin: 3.2 g/dL (calc) (ref 1.9–3.7)
Glucose, Bld: 83 mg/dL (ref 65–139)
Potassium: 4.3 mmol/L (ref 3.5–5.3)
Sodium: 134 mmol/L — ABNORMAL LOW (ref 135–146)
Total Bilirubin: 0.3 mg/dL (ref 0.2–1.2)
Total Protein: 6.3 g/dL (ref 6.1–8.1)

## 2020-09-27 LAB — URINALYSIS, ROUTINE W REFLEX MICROSCOPIC
Bilirubin Urine: NEGATIVE
Glucose, UA: NEGATIVE
Hyaline Cast: NONE SEEN /LPF
Ketones, ur: NEGATIVE
Nitrite: NEGATIVE
Specific Gravity, Urine: 1.009 (ref 1.001–1.03)
Squamous Epithelial / HPF: NONE SEEN /HPF (ref <=5)
pH: 6 (ref 5.0–8.0)

## 2020-09-27 LAB — MICROALBUMIN / CREATININE URINE RATIO
Creatinine, Urine: 92 mg/dL (ref 20–275)
Microalb Creat Ratio: 237 mcg/mg creat — ABNORMAL HIGH (ref <30)
Microalb, Ur: 21.8 mg/dL

## 2020-09-27 LAB — CBC
HCT: 27.1 % — ABNORMAL LOW (ref 35.0–45.0)
Hemoglobin: 8.9 g/dL — ABNORMAL LOW (ref 11.7–15.5)
MCH: 29.6 pg (ref 27.0–33.0)
MCHC: 32.8 g/dL (ref 32.0–36.0)
MCV: 90 fL (ref 80.0–100.0)
MPV: 9.5 fL (ref 7.5–12.5)
Platelets: 390 10*3/uL (ref 140–400)
RBC: 3.01 10*6/uL — ABNORMAL LOW (ref 3.80–5.10)
RDW: 14.4 % (ref 11.0–15.0)
WBC: 8.7 10*3/uL (ref 3.8–10.8)

## 2020-09-27 LAB — TSH: TSH: 4.99 mIU/L — ABNORMAL HIGH (ref 0.40–4.50)

## 2020-09-27 LAB — PROTEIN ELECTROPHORESIS,W/TOTAL PROTEIN AND RFLX TO IFE,URINE
Creatinine, 24H Ur: 0.47 g/(24.h) — ABNORMAL LOW (ref 0.50–2.15)
PROTEIN/CREATININE RATIO: 0.947 mg/mg creat — ABNORMAL HIGH (ref <=0.1)
PROTEIN/CREATININE RATIO: 947 mg/g creat — ABNORMAL HIGH (ref <=114)
Protein, 24H Urine: 445 mg/24 h — ABNORMAL HIGH (ref 0–149)

## 2020-09-27 NOTE — Telephone Encounter (Signed)
I see the following results in her labs: Protein Electrophoresis, with Total Protein and Reflex to IFE, Urine Order: [id number] Status:  Final result Visible to patient:  Yes (not seen) Next appt:  None  0 Result Notes  Ref Range & Units 8 d ago  Creatinine, 24H Ur 0.50 - 2.15 g/24 h 0.47Low   PROTEIN/CREATININE RATIO < OR = 114 mg/g creat 947High   PROTEIN/CREATININE RATIO < OR = 0.1 mg/mg creat 0.947High   Protein, 24H Urine 0 - 149 mg/24 h 445High   Albumin  CANCELED   Comment: TEST NOT PERFORMED  .  Specimen leaked in transit.   Result canceled by the ancillary.           Do you want me to contact lab and have this recollected since Albumin did not get done?

## 2020-09-27 NOTE — Telephone Encounter (Signed)
Thanks, please call patient: There is significant protein in the urine, I am not sure what the reason might be but I suspect it is related to what ever is going on with the kidneys.  I have referred to a nephrologist for further evaluation/treatment.  Sorry she is getting bounced around to so many specialist, but this is a bit out of my realm of expertise.

## 2020-09-27 NOTE — Addendum Note (Signed)
Addended by: Maryla Morrow on: 09/27/2020 11:50 AM   Modules accepted: Orders

## 2020-10-01 ENCOUNTER — Telehealth: Payer: Self-pay

## 2020-10-01 ENCOUNTER — Encounter: Payer: Self-pay | Admitting: Osteopathic Medicine

## 2020-10-01 DIAGNOSIS — R935 Abnormal findings on diagnostic imaging of other abdominal regions, including retroperitoneum: Secondary | ICD-10-CM

## 2020-10-01 DIAGNOSIS — N1330 Unspecified hydronephrosis: Secondary | ICD-10-CM | POA: Insufficient documentation

## 2020-10-01 DIAGNOSIS — R809 Proteinuria, unspecified: Secondary | ICD-10-CM

## 2020-10-01 DIAGNOSIS — N131 Hydronephrosis with ureteral stricture, not elsewhere classified: Secondary | ICD-10-CM

## 2020-10-01 HISTORY — DX: Abnormal findings on diagnostic imaging of other abdominal regions, including retroperitoneum: R93.5

## 2020-10-01 NOTE — Telephone Encounter (Signed)
Pt called requesting if provider has received and completed an attending physician statement for [place]. Pls advise, thanks.

## 2020-10-02 NOTE — Telephone Encounter (Signed)
Task completed. Disability form faxed to [phone]. Confirmation rec'd. Documents handed to front office for release of medical records.

## 2020-10-02 NOTE — Telephone Encounter (Signed)
MyChart message sent to patient  Form placed in Mary Stark's basket to fax then to take copy up front to request medical records be sent to [facility]

## 2020-10-03 ENCOUNTER — Ambulatory Visit: Payer: No Typology Code available for payment source | Admitting: Osteopathic Medicine

## 2020-10-04 ENCOUNTER — Encounter: Payer: Self-pay | Admitting: Medical-Surgical

## 2020-10-04 ENCOUNTER — Ambulatory Visit (INDEPENDENT_AMBULATORY_CARE_PROVIDER_SITE_OTHER): Payer: No Typology Code available for payment source | Admitting: Medical-Surgical

## 2020-10-04 VITALS — BP 145/85 | HR 72 | Temp 97.6°F | Ht 66.0 in | Wt 197.1 lb

## 2020-10-04 DIAGNOSIS — R300 Dysuria: Secondary | ICD-10-CM | POA: Diagnosis not present

## 2020-10-04 LAB — POCT URINALYSIS DIP (CLINITEK)
Bilirubin, UA: NEGATIVE
Glucose, UA: NEGATIVE mg/dL
Ketones, POC UA: NEGATIVE mg/dL
Nitrite, UA: NEGATIVE
POC PROTEIN,UA: 100 — AB
Spec Grav, UA: 1.015 (ref 1.010–1.025)
Urobilinogen, UA: 0.2 E.U./dL
pH, UA: 7 (ref 5.0–8.0)

## 2020-10-04 MED ORDER — NITROFURANTOIN MONOHYD MACRO 100 MG PO CAPS: 100.0000 mg | ORAL_CAPSULE | Freq: Two times a day (BID) | ORAL | 0 refills | Status: DC

## 2020-10-04 NOTE — Progress Notes (Signed)
Subjective:    CC: dysuria  HPI: Pleasant 64 year old female presenting for evaluation of dysuria. Notes that she does have underlying issues but noticed a foul urinary odor last week. Has had mild burning with urination along with bladder spasms and groin pain. Has seen blood in her urine. No fever, chills, unusual back pain, or nausea. Had a fall a few nights ago and is having difficulty sleeping at night due to urinary discomfort and body aches. Has been taking AZO to help her bladder issue. Has Detrol LA prescribed but has not been taking it since it makes her so sleepy. Has not tried taking it at night.   I reviewed the past medical history, family history, social history, surgical history, and allergies today and no changes were needed.  Please see the problem list section below in epic for further details.  Past Medical History: Past Medical History:  Diagnosis Date  . Alcohol dependence ([place])   . Anxiety   . Arthritis    Back and lt knee  . Cancer ([place])    skin on nose  . Depression 07/17/2013  . GERD (gastroesophageal reflux disease)   . History of hepatitis C    TX FOR 2012  . History of kidney stones   . History of rheumatic fever AS CHILD   NO PROBLEMS FROM  . Hyperlipidemia 07/17/2013  . Hypertension   . Seasonal allergies   . Toe fracture, right 10/07/2016  . Vitamin D deficiency 07/17/2013   Past Surgical History: Past Surgical History:  Procedure Laterality Date  . ANAL RECTAL MANOMETRY N/A 01/31/2020   Procedure: ANO RECTAL MANOMETRY;  Surgeon: Arta Silence, MD;  [facility];  Service: Endoscopy;  Laterality: N/A;  . [place]  . CYSTOSCOPY WITH RETROGRADE PYELOGRAM, URETEROSCOPY AND STENT PLACEMENT Bilateral 06/11/2020   Procedure: CYSTOSCOPY WITH RETROGRADE PYELOGRAM, URETEROSCOPY AND STENT PLACEMENT, RIGHT URETERAL BIOPSY;  Surgeon: Robley Fries, MD;  Location: [facility];  Service: Urology;  Laterality: Bilateral;  90 MINS  .  CYSTOSCOPY WITH RETROGRADE PYELOGRAM, URETEROSCOPY AND STENT PLACEMENT Bilateral 07/05/2020   Procedure: CYSTOSCOPY WITH RETROGRADE PYELOGRAM, URETEROSCOPY,  BIOPSIES AND STENT EXCHANGES;  Surgeon: Robley Fries, MD;  Location: [facility];  Service: Urology;  Laterality: Bilateral;  . FINGER SURGERY Right    5th  . GYNECOLOGIC CRYOSURGERY  YRS AGO  . TONSILLECTOMY  AS CHILD   Social History: Social History   Socioeconomic History  . Marital status: Married    Spouse name: Not on file  . Number of children: Not on file  . Years of education: Not on file  . Highest education level: Not on file  Occupational History  . Not on file  Tobacco Use  . Smoking status: Former Smoker    Packs/day: 1.00    Years: 25.00    Pack years: 25.00    Types: Cigarettes    Quit date: 2012    Years since quitting: 9.8  . Smokeless tobacco: Never Used  Vaping Use  . Vaping Use: Every day  . Substances: Nicotine  Substance and Sexual Activity  . Alcohol use: Not Currently    Alcohol/week: 24.0 - 36.0 standard drinks    Types: 24 - 36 Cans of beer per week    Comment: Pt reports that she stopped march 2021  . Drug use: No  . Sexual activity: Yes    Birth control/protection: None  Other Topics Concern  . Not on file  Social History Narrative  .  Not on file   Social Determinants of Health   Financial Resource Strain:   . Difficulty of Paying Living Expenses: Not on file  Food Insecurity:   . Worried About Charity fundraiser in the Last Year: Not on file  . Ran Out of Food in the Last Year: Not on file  Transportation Needs:   . Lack of Transportation (Medical): Not on file  . Lack of Transportation (Non-Medical): Not on file  Physical Activity:   . Days of Exercise per Week: Not on file  . Minutes of Exercise per Session: Not on file  Stress:   . Feeling of Stress : Not on file  Social Connections:   . Frequency of Communication with Friends and Family: Not on file   . Frequency of Social Gatherings with Friends and Family: Not on file  . Attends Religious Services: Not on file  . Active Member of Clubs or Organizations: Not on file  . Attends Archivist Meetings: Not on file  . Marital Status: Not on file   Family History: Family History  Problem Relation Age of Onset  . Ovarian cancer Mother   . Cardiomyopathy Father   . Valvular heart disease Father   . Liver cancer Brother   . Protein C deficiency Brother   . Protein C deficiency Brother   . Stroke Brother    Allergies: Allergies  Allergen Reactions  . Metformin And Related Other (See Comments)    Lactic acidosis   . Celebrex [Celecoxib] Other (See Comments)    Dizziness, "makes me feel bad"  . Penicillins Hives and Rash    Has patient had a PCN reaction causing immediate rash, facial/tongue/throat swelling, SOB or lightheadedness with hypotension: Yes Has patient had a PCN reaction causing severe rash involving mucus membranes or skin necrosis:No Has patient had a PCN reaction that required hospitalization: Yes Has patient had a PCN reaction occurring within the last 10 years: No If all of the above answers are "NO", then may proceed with Cephalosporin use.    Medications: See med rec.  Review of Systems: See HPI for pertinent positives and negatives.   Objective:    General: Well Developed, well nourished, and in no acute distress.  Neuro: Alert and oriented x3.  HEENT: Normocephalic, atraumatic.  Skin: Warm and dry. Cardiac: Regular rate and rhythm, no murmurs rubs or gallops, no lower extremity edema. Currently on 2L O2 via [place]. Respiratory: Clear to auscultation bilaterally. Not using accessory muscles, speaking in full sentences. Abdomen: Soft, nontender, nondistended. Bowel sounds + x 4 quadrants. No HSM appreciated. No CVA tenderness.   Impression and Recommendations:    1. Dysuria POCT UA negative for nitrites, positive for large blood, protein, and small  leukocytes. Sending for culture. Since she has been taking AZO and it is mildly bacteriocidal, treating empirically with Macrobid BID x 5 days. Ok to continue AZO as needed. Recommend trying Detrol LA dosing at night to see if this is more tolerable.  - POCT URINALYSIS DIP (CLINITEK) - Urine Culture - nitrofurantoin, macrocrystal-monohydrate, (MACROBID) 100 MG capsule; Take 1 capsule (100 mg total) by mouth 2 (two) times daily.  Dispense: 10 capsule; Refill: 0  Return if symptoms worsen or fail to improve. ___________________________________________ [name], DNP, APRN, FNP-BC [facility] [place]

## 2020-10-06 LAB — URINE CULTURE
MICRO NUMBER:: 11109073
SPECIMEN QUALITY:: ADEQUATE

## 2020-10-09 ENCOUNTER — Telehealth: Payer: Self-pay | Admitting: Osteopathic Medicine

## 2020-10-09 ENCOUNTER — Encounter: Payer: Self-pay | Admitting: Osteopathic Medicine

## 2020-10-09 ENCOUNTER — Telehealth (INDEPENDENT_AMBULATORY_CARE_PROVIDER_SITE_OTHER): Payer: No Typology Code available for payment source | Admitting: Osteopathic Medicine

## 2020-10-09 VITALS — BP 158/90 | HR 77 | Wt 191.0 lb

## 2020-10-09 DIAGNOSIS — R300 Dysuria: Secondary | ICD-10-CM | POA: Diagnosis not present

## 2020-10-09 DIAGNOSIS — N289 Disorder of kidney and ureter, unspecified: Secondary | ICD-10-CM

## 2020-10-09 DIAGNOSIS — E8809 Other disorders of plasma-protein metabolism, not elsewhere classified: Secondary | ICD-10-CM

## 2020-10-09 DIAGNOSIS — N131 Hydronephrosis with ureteral stricture, not elsewhere classified: Secondary | ICD-10-CM

## 2020-10-09 DIAGNOSIS — R809 Proteinuria, unspecified: Secondary | ICD-10-CM | POA: Diagnosis not present

## 2020-10-09 DIAGNOSIS — R93429 Abnormal radiologic findings on diagnostic imaging of unspecified kidney: Secondary | ICD-10-CM | POA: Diagnosis not present

## 2020-10-09 DIAGNOSIS — R600 Localized edema: Secondary | ICD-10-CM

## 2020-10-09 NOTE — Telephone Encounter (Signed)
Please contact Dr. Keane Scrape office - [facility]  Please ask them to arrange follow-up with patient ASAP    They need to address the following:  - plan for renal biopsy - has an appointment been set up for this procedure? -  patient has been treated here for UTI, she still has ureteral stents in place

## 2020-10-09 NOTE — Progress Notes (Signed)
Virtual Visit via Video (App used: MyChartat) Note  I connected with      [name] on 10/09/20 at 12:00 PM  by a telemedicine application and verified that I am speaking with the correct person using two identifiers.  Patient is at home I am in office   I discussed the limitations of evaluation and management by telemedicine and the availability of in person appointments. The patient expressed understanding and agreed to proceed.  History of Present Illness: Mary Stark is a 64 y.o. female who would like to discuss concern for UTI   Needs work/FMLA forms filled out UTI - urgency, recently treated by my colleague  Bloody urine at night not so much during the day  Stent still in place - pt unclear about followup with urology Still LE edema, has not heard back from nephrology     Observations/Objective: BP (!) 158/90   Pulse 77   Wt 191 lb (86.6 kg)   BMI 30.83 kg/m  BP Readings from Last 3 Encounters:  10/09/20 (!) 158/90  10/04/20 (!) 145/85  09/22/20 (!) 162/100   Exam: Normal Speech.  NAD  Lab and Radiology Results No results found for this or any previous visit (from the past 72 hour(s)). No results found.     Assessment and Plan: 64 y.o. female with The primary encounter diagnosis was Dysuria. Diagnoses of Hydronephrosis with ureteral stricture, not elsewhere classified, Proteinuria, unspecified type, Abnormal CT scan, kidney, Bilateral lower extremity edema, Renal insufficiency, and Hypoalbuminemia were also pertinent to this visit.  At this point, I really think patient needs follow-up with urology concern for recurrent UTIs while stent is in place.  See telephone note for details, my medical assistant reached out to their office today and did not seem to get anywhere.  MyChart message sent to patient.  Also sent phone note regarding referral to nephrology to help [place] with hypoalbuminemia/concern for glomerular disease  PDMP not reviewed this  encounter. No orders of the defined types were placed in this encounter.  No orders of the defined types were placed in this encounter.  There are no Patient Instructions on file for this visit.  Instructions sent via MyChart. If MyChart not available, pt was given option for info via personal e-mail w/ no guarantee of protected health info over unsecured e-mail communication, and MyChart sign-up instructions were sent to patient.   Follow Up Instructions: No follow-ups on file.    I discussed the assessment and treatment plan with the patient. The patient was provided an opportunity to ask questions and all were answered. The patient agreed with the plan and demonstrated an understanding of the instructions.   The patient was advised to call back or seek an in-person evaluation if any new concerns, if symptoms worsen or if the condition fails to improve as anticipated.  30 minutes of non-face-to-face time was provided during this encounter.      . . . . . . . . . . . . . Marland Kitchen                   Historical information moved to improve visibility of documentation.  Past Medical History:  Diagnosis Date  . Alcohol dependence ([place])   . Anxiety   . Arthritis    Back and lt knee  . Cancer ([place])    skin on nose  . Depression 07/17/2013  . GERD (gastroesophageal reflux disease)   . History of hepatitis C  [place] FOR 2012  . History of kidney stones   . History of rheumatic fever AS CHILD   NO PROBLEMS FROM  . Hyperlipidemia 07/17/2013  . Hypertension   . Seasonal allergies   . Toe fracture, right 10/07/2016  . Vitamin D deficiency 07/17/2013   Past Surgical History:  Procedure Laterality Date  . ANAL RECTAL MANOMETRY N/A 01/31/2020   Procedure: ANO RECTAL MANOMETRY;  Surgeon: Arta Silence, MD;  Location: [facility];  Service: Endoscopy;  Laterality: N/A;  . [place]  . CYSTOSCOPY WITH RETROGRADE PYELOGRAM, URETEROSCOPY AND STENT  PLACEMENT Bilateral 06/11/2020   Procedure: CYSTOSCOPY WITH RETROGRADE PYELOGRAM, URETEROSCOPY AND STENT PLACEMENT, RIGHT URETERAL BIOPSY;  Surgeon: Robley Fries, MD;  Location: [facility];  Service: Urology;  Laterality: Bilateral;  90 MINS  . CYSTOSCOPY WITH RETROGRADE PYELOGRAM, URETEROSCOPY AND STENT PLACEMENT Bilateral 07/05/2020   Procedure: CYSTOSCOPY WITH RETROGRADE PYELOGRAM, URETEROSCOPY,  BIOPSIES AND STENT EXCHANGES;  Surgeon: Robley Fries, MD;  Location: [facility];  Service: Urology;  Laterality: Bilateral;  . FINGER SURGERY Right    5th  . GYNECOLOGIC CRYOSURGERY  YRS AGO  . TONSILLECTOMY  AS CHILD   Social History   Tobacco Use  . Smoking status: Former Smoker    Packs/day: 1.00    Years: 25.00    Pack years: 25.00    Types: Cigarettes    Quit date: 2012    Years since quitting: 9.8  . Smokeless tobacco: Never Used  Substance Use Topics  . Alcohol use: Not Currently    Alcohol/week: 24.0 - 36.0 standard drinks    Types: 24 - 36 Cans of beer per week    Comment: Pt reports that she stopped march 2021   family history includes Cardiomyopathy in her father; Liver cancer in her brother; Ovarian cancer in her mother; Protein C deficiency in her brother and brother; Stroke in her brother; Valvular heart disease in her father.  Medications: Current Outpatient Medications  Medication Sig Dispense Refill  . acetaminophen (TYLENOL) 500 MG tablet Take 1,000 mg by mouth every 8 (eight) hours as needed for moderate pain.     Marland Kitchen albuterol (PROAIR HFA) 108 (90 Base) MCG/ACT inhaler INHALE 1 TO 2 PUFFS BY MOUTH INTO THE LUNGS EVERY 4 HOURS AS NEEDED FOR WHEEZING OR SHORTNESS OF BREATH 8.5 g 1  . amLODipine (NORVASC) 5 MG tablet TAKE 1 TABLET BY MOUTH ONCE DAILY (Patient taking differently: Take 5 mg by mouth every evening. ) 90 tablet 1  . ARIPiprazole (ABILIFY) 5 MG tablet Take 5 mg by mouth at bedtime.     Marland Kitchen ascorbic acid (VITAMIN C) 500 MG tablet Take 1,000 mg  by mouth every evening.     . ASPIRIN LOW DOSE 81 MG EC tablet TAKE 1 TABLET BY MOUTH DAILY. (Patient taking differently: Take 81 mg by mouth daily. ) 90 tablet 3  . Benzphetamine HCl 50 MG TABS Take 50 mg by mouth daily.    Marland Kitchen buPROPion (WELLBUTRIN XL) 300 MG 24 hr tablet Take 1 tablet (300 mg total) by mouth daily. 90 tablet 1  . BYSTOLIC 20 MG TABS TAKE 1 TABLET (20 MG TOTAL) BY MOUTH DAILY. (Patient taking differently: Take 20 mg by mouth daily. ) 90 tablet 0  . Cholecalciferol (VITAMIN D) 125 MCG (5000 UT) CAPS Take 5,000 Units by mouth every evening.     . clonazePAM (KLONOPIN) 0.5 MG tablet Take 0.5 mg by mouth at bedtime.     Marland Kitchen  Coenzyme Q10 (CO Q-10) 200 MG CAPS Take 200 mg by mouth every evening.     . DULoxetine (CYMBALTA) 30 MG capsule Take 1 capsule by mouth daily.    . fexofenadine (ALLEGRA) 180 MG tablet Take 180 mg by mouth every evening.     . furosemide (LASIX) 20 MG tablet Take 20mg  on day 1 and 2 then every other day. 30 tablet 3  . gabapentin (NEURONTIN) 300 MG capsule 1  qam   4   qhs (Patient taking differently: Take 300-1,200 mg by mouth See admin instructions. Take 300 mg by mouth in the morning and afternoon and 1200 mg at night) 150 capsule 5  . GLUCOSAMINE-CHONDROITIN PO Take 2 tablets by mouth every evening.     Marland Kitchen guaiFENesin-dextromethorphan (ROBITUSSIN DM) 100-10 MG/5ML syrup Take 10 mLs by mouth every 4 (four) hours as needed for cough. 473 mL 0  . nitrofurantoin, macrocrystal-monohydrate, (MACROBID) 100 MG capsule Take 1 capsule (100 mg total) by mouth 2 (two) times daily. 10 capsule 0  . Omega-3 Fatty Acids (FISH OIL) 1200 MG CAPS Take 1,200 mg by mouth every evening.     Marland Kitchen omeprazole (PRILOSEC) 20 MG capsule Take 1 capsule (20 mg total) by mouth daily. (Patient taking differently: Take 20 mg by mouth every evening. ) 90 capsule 1  . ondansetron (ZOFRAN-ODT) 4 MG disintegrating tablet Take 1 tablet (4 mg total) by mouth every 8 (eight) hours as needed for nausea or  vomiting. 12 tablet 0  . phentermine (ADIPEX-P) 37.5 MG tablet Take 75 mg by mouth daily.     . predniSONE (DELTASONE) 10 MG tablet 4 tabs for 2 days 3 tabs for 2 days 2 tabs for 2 days 1 tab for 2 days and continue your current therapy 20 tablet 0  . SAXENDA 18 MG/3ML SOPN Inject 3 mg into the skin daily.     . simvastatin (ZOCOR) 20 MG tablet TAKE 1 TABLET (20 MG TOTAL) BY MOUTH DAILY. 90 tablet 1  . tamsulosin (FLOMAX) 0.4 MG CAPS capsule Take 0.4 mg by mouth at bedtime.    . tolterodine (DETROL LA) 4 MG 24 hr capsule Take 4 mg by mouth daily.    . vitamin B-12 (CYANOCOBALAMIN) 1000 MCG tablet Take 2,000 mcg by mouth every evening.     . Vitamin E 180 MG (400 UNIT) CAPS Take 400 Units by mouth every evening.      No current facility-administered medications for this visit.   Allergies  Allergen Reactions  . Metformin And Related Other (See Comments)    Lactic acidosis   . Celebrex [Celecoxib] Other (See Comments)    Dizziness, "makes me feel bad"  . Penicillins Hives and Rash    Has patient had a PCN reaction causing immediate rash, facial/tongue/throat swelling, SOB or lightheadedness with hypotension: Yes Has patient had a PCN reaction causing severe rash involving mucus membranes or skin necrosis:No Has patient had a PCN reaction that required hospitalization: Yes Has patient had a PCN reaction occurring within the last 10 years: No If all of the above answers are "NO", then may proceed with Cephalosporin use.

## 2020-10-09 NOTE — Telephone Encounter (Signed)
Yes I am aware that stents are common - my concern is that that patient is having new UTI symptoms while stent is in place, just wanted to make sure they are aware. Patient has been understandably frustrated with lack of clear plan from urology and since she is having new urinary problems she may need to be evaluated by someone sooner, whoever is covering Dr Claudia Desanctis while she is out.

## 2020-10-09 NOTE — Telephone Encounter (Addendum)
At provider's request contacted [facility] (Dr. Claudia Desanctis). Spoke with the Triage nurse, aware that patient is pending a visit with Dr. Claudia Desanctis. She stated that a message was sent to Dr. Claudia Desanctis yesterday, however provider is currently out of the office for the week for vacation. Triage nurse has sent another message to Dr. Claudia Desanctis. She mentioned that it is not unusual for patients to have ureteral stents in place. Provider's direct call back info provided to Triage nurse in case Dr. Claudia Desanctis has any inquiries.

## 2020-10-09 NOTE — Telephone Encounter (Signed)
The triage nurse stated that it is common to have UTI/Kidney stones symptoms with stents in place. I was informed that Dr. Claudia Desanctis does check her emails/msgs daily. The office will contact the provider directly with any new updates.

## 2020-10-10 ENCOUNTER — Encounter: Payer: Self-pay | Admitting: Osteopathic Medicine

## 2020-10-10 MED FILL — buPROPion HCL ER (XL) 300 M: 300 | 30 days supply | Qty: 30 | Fill #2

## 2020-10-11 ENCOUNTER — Other Ambulatory Visit (HOSPITAL_COMMUNITY): Payer: Self-pay | Admitting: Urology

## 2020-10-11 ENCOUNTER — Telehealth: Payer: Self-pay | Admitting: Osteopathic Medicine

## 2020-10-11 DIAGNOSIS — D4101 Neoplasm of uncertain behavior of right kidney: Secondary | ICD-10-CM

## 2020-10-11 NOTE — Telephone Encounter (Signed)
Pt called. She wants to know if you got Leave of Absence paperwork for continuation of leave.

## 2020-10-14 DIAGNOSIS — A419 Sepsis, unspecified organism: Secondary | ICD-10-CM

## 2020-10-14 DIAGNOSIS — C679 Malignant neoplasm of bladder, unspecified: Secondary | ICD-10-CM

## 2020-10-14 DIAGNOSIS — Z8619 Personal history of other infectious and parasitic diseases: Secondary | ICD-10-CM

## 2020-10-14 HISTORY — DX: Malignant neoplasm of bladder, unspecified: C67.9

## 2020-10-14 HISTORY — DX: Sepsis, unspecified organism: A41.9

## 2020-10-14 HISTORY — DX: Personal history of other infectious and parasitic diseases: Z86.19

## 2020-10-15 ENCOUNTER — Encounter (HOSPITAL_COMMUNITY): Payer: Self-pay

## 2020-10-15 NOTE — Progress Notes (Signed)
[name] Female, 64 y.o., [birth year] MRN:  [id number] Phone:  [phone] Jerilynn Mages) PCP:  [name], DO Coverage:  [employer] Next Appt With Radiology ([facility]) 10/30/2020 at 8:00 AM  RE: Biopsy Received: Yesterday Message Details  Sandi Mariscal, MD  Lenore Cordia; P Ir Procedure Requests OK for CT guided L RP LN Bx   CT abd and pelvis (9/27) - image 39, series 5.   Cathren Harsh   Previous Messages  ----- Message -----  From: Lenore Cordia  Sent: 10/11/2020  5:25 PM EDT  To: Ir Procedure Requests  Subject: Biopsy                      Procedure Requested: Biopsy of Retroperitoneal LAD    Reason for Procedure: right uncertain neoplasm of kidney    Provider Requesting: Jacalyn Lefevre , MD  Provider Telephone: [phone]    Other Info:

## 2020-10-17 ENCOUNTER — Other Ambulatory Visit: Payer: Self-pay | Admitting: Osteopathic Medicine

## 2020-10-17 DIAGNOSIS — I10 Essential (primary) hypertension: Secondary | ICD-10-CM

## 2020-10-17 MED FILL — NEBIVOLOL HCL 20 MG TABS: 20 | 90 days supply | Qty: 90 | Fill #0

## 2020-10-18 ENCOUNTER — Telehealth: Payer: Self-pay | Admitting: Osteopathic Medicine

## 2020-10-18 NOTE — Telephone Encounter (Signed)
FMLA form faxed to [employer] at [phone]. Attempted to contact the patient, no answer. Aware to return a call back to the clinic. Direct call back info provided.

## 2020-10-18 NOTE — Telephone Encounter (Signed)
Patient's husband brought paperwork to be filled out for patient, stated Dr A knew what these were about and that she had an appt for these not long ago, papers left in provider box. AM

## 2020-10-18 NOTE — Telephone Encounter (Signed)
Pt returned a call back to the clinic. Per pt, she has an appointment with the Nephrologist on Tuesday and she is having the biopsy completed on Friday. Pt will keep provider updated as needed.

## 2020-10-18 NOTE — Telephone Encounter (Signed)
FMLA filled out, should be faxed today, can update patient Ask if she's heard back from Dr Claudia Desanctis (urology) - Dr Claudia Desanctis told me last week that biopsy was planned for sometime soon  Ask if she's got a confirmed appt w/ nephology for the proteinuria/lower extremity swelling (I think may be related to other kidney issues but just wanted a second opinion)  Thanks!

## 2020-10-22 ENCOUNTER — Encounter (HOSPITAL_COMMUNITY): Payer: Self-pay

## 2020-10-22 ENCOUNTER — Emergency Department
Admission: EM | Admit: 2020-10-22 | Discharge: 2020-10-22 | Disposition: A | Discharge status: Home / Self Care | Payer: No Typology Code available for payment source | Source: Home / Self Care | Attending: Family Medicine | Admitting: Family Medicine

## 2020-10-22 ENCOUNTER — Inpatient Hospital Stay (HOSPITAL_COMMUNITY)
Admission: EM | Admit: 2020-10-22 | Discharge: 2020-10-25 | DRG: 853 | Disposition: A | Discharge status: Home / Self Care | Payer: No Typology Code available for payment source | Attending: Internal Medicine | Admitting: Internal Medicine

## 2020-10-22 ENCOUNTER — Emergency Department (HOSPITAL_COMMUNITY): Payer: No Typology Code available for payment source

## 2020-10-22 ENCOUNTER — Other Ambulatory Visit: Payer: Self-pay

## 2020-10-22 DIAGNOSIS — R509 Fever, unspecified: Secondary | ICD-10-CM

## 2020-10-22 DIAGNOSIS — N131 Hydronephrosis with ureteral stricture, not elsewhere classified: Secondary | ICD-10-CM | POA: Diagnosis not present

## 2020-10-22 DIAGNOSIS — R590 Localized enlarged lymph nodes: Secondary | ICD-10-CM | POA: Diagnosis present

## 2020-10-22 DIAGNOSIS — R599 Enlarged lymph nodes, unspecified: Secondary | ICD-10-CM

## 2020-10-22 DIAGNOSIS — R351 Nocturia: Secondary | ICD-10-CM | POA: Diagnosis not present

## 2020-10-22 DIAGNOSIS — Z6830 Body mass index (BMI) 30.0-30.9, adult: Secondary | ICD-10-CM

## 2020-10-22 DIAGNOSIS — N136 Pyonephrosis: Secondary | ICD-10-CM | POA: Diagnosis present

## 2020-10-22 DIAGNOSIS — K529 Noninfective gastroenteritis and colitis, unspecified: Secondary | ICD-10-CM | POA: Diagnosis present

## 2020-10-22 DIAGNOSIS — N1330 Unspecified hydronephrosis: Secondary | ICD-10-CM | POA: Diagnosis present

## 2020-10-22 DIAGNOSIS — D649 Anemia, unspecified: Secondary | ICD-10-CM | POA: Diagnosis present

## 2020-10-22 DIAGNOSIS — A419 Sepsis, unspecified organism: Principal | ICD-10-CM | POA: Diagnosis present

## 2020-10-22 DIAGNOSIS — E785 Hyperlipidemia, unspecified: Secondary | ICD-10-CM | POA: Diagnosis present

## 2020-10-22 DIAGNOSIS — E876 Hypokalemia: Secondary | ICD-10-CM | POA: Diagnosis present

## 2020-10-22 DIAGNOSIS — E861 Hypovolemia: Secondary | ICD-10-CM | POA: Diagnosis present

## 2020-10-22 DIAGNOSIS — Z720 Tobacco use: Secondary | ICD-10-CM | POA: Diagnosis present

## 2020-10-22 DIAGNOSIS — Z7982 Long term (current) use of aspirin: Secondary | ICD-10-CM | POA: Diagnosis not present

## 2020-10-22 DIAGNOSIS — G9341 Metabolic encephalopathy: Secondary | ICD-10-CM | POA: Diagnosis present

## 2020-10-22 DIAGNOSIS — N390 Urinary tract infection, site not specified: Secondary | ICD-10-CM | POA: Diagnosis not present

## 2020-10-22 DIAGNOSIS — Z79899 Other long term (current) drug therapy: Secondary | ICD-10-CM

## 2020-10-22 DIAGNOSIS — K219 Gastro-esophageal reflux disease without esophagitis: Secondary | ICD-10-CM | POA: Diagnosis present

## 2020-10-22 DIAGNOSIS — I10 Essential (primary) hypertension: Secondary | ICD-10-CM | POA: Diagnosis present

## 2020-10-22 DIAGNOSIS — G894 Chronic pain syndrome: Secondary | ICD-10-CM | POA: Diagnosis present

## 2020-10-22 DIAGNOSIS — N179 Acute kidney failure, unspecified: Secondary | ICD-10-CM | POA: Diagnosis present

## 2020-10-22 DIAGNOSIS — Z85828 Personal history of other malignant neoplasm of skin: Secondary | ICD-10-CM

## 2020-10-22 DIAGNOSIS — F1021 Alcohol dependence, in remission: Secondary | ICD-10-CM

## 2020-10-22 DIAGNOSIS — E8809 Other disorders of plasma-protein metabolism, not elsewhere classified: Secondary | ICD-10-CM | POA: Diagnosis present

## 2020-10-22 DIAGNOSIS — Z8616 Personal history of COVID-19: Secondary | ICD-10-CM

## 2020-10-22 DIAGNOSIS — R410 Disorientation, unspecified: Secondary | ICD-10-CM

## 2020-10-22 DIAGNOSIS — F05 Delirium due to known physiological condition: Secondary | ICD-10-CM

## 2020-10-22 DIAGNOSIS — F418 Other specified anxiety disorders: Secondary | ICD-10-CM | POA: Diagnosis present

## 2020-10-22 DIAGNOSIS — E669 Obesity, unspecified: Secondary | ICD-10-CM | POA: Diagnosis present

## 2020-10-22 DIAGNOSIS — E871 Hypo-osmolality and hyponatremia: Secondary | ICD-10-CM | POA: Diagnosis present

## 2020-10-22 DIAGNOSIS — R6520 Severe sepsis without septic shock: Secondary | ICD-10-CM | POA: Diagnosis present

## 2020-10-22 DIAGNOSIS — D6489 Other specified anemias: Secondary | ICD-10-CM | POA: Diagnosis present

## 2020-10-22 DIAGNOSIS — F17290 Nicotine dependence, other tobacco product, uncomplicated: Secondary | ICD-10-CM | POA: Diagnosis present

## 2020-10-22 LAB — RAPID URINE DRUG SCREEN, HOSP PERFORMED
Amphetamines: NOT DETECTED
Barbiturates: NOT DETECTED
Benzodiazepines: NOT DETECTED
Cocaine: NOT DETECTED
Opiates: NOT DETECTED
Tetrahydrocannabinol: NOT DETECTED

## 2020-10-22 LAB — URINALYSIS, ROUTINE W REFLEX MICROSCOPIC
Bilirubin Urine: NEGATIVE
Glucose, UA: NEGATIVE mg/dL
Ketones, ur: 5 mg/dL — AB
Nitrite: NEGATIVE
Protein, ur: 100 mg/dL — AB
RBC / HPF: >50 RBC/hpf — ABNORMAL HIGH (ref 0–5)
Specific Gravity, Urine: 1.017 (ref 1.005–1.030)
WBC, UA: >50 WBC/hpf — ABNORMAL HIGH (ref 0–5)
pH: 6 (ref 5.0–8.0)

## 2020-10-22 LAB — COMPREHENSIVE METABOLIC PANEL
ALT: 11 U/L (ref 0–44)
AST: 21 U/L (ref 15–41)
Albumin: 3.3 g/dL — ABNORMAL LOW (ref 3.5–5.0)
Alkaline Phosphatase: 70 U/L (ref 38–126)
Anion gap: 12 (ref 5–15)
BUN: 18 mg/dL (ref 8–23)
CO2: 23 mmol/L (ref 22–32)
Calcium: 8.8 mg/dL — ABNORMAL LOW (ref 8.9–10.3)
Chloride: 88 mmol/L — ABNORMAL LOW (ref 98–111)
Creatinine, Ser: 1.32 mg/dL — ABNORMAL HIGH (ref 0.44–1.00)
GFR, Estimated: 45 mL/min — ABNORMAL LOW (ref >60)
Glucose, Bld: 106 mg/dL — ABNORMAL HIGH (ref 70–99)
Potassium: 4.2 mmol/L (ref 3.5–5.1)
Sodium: 123 mmol/L — ABNORMAL LOW (ref 135–145)
Total Bilirubin: 0.5 mg/dL (ref 0.3–1.2)
Total Protein: 10.6 g/dL — ABNORMAL HIGH (ref 6.5–8.1)

## 2020-10-22 LAB — CBC
HCT: 34.5 % — ABNORMAL LOW (ref 36.0–46.0)
Hemoglobin: 10.9 g/dL — ABNORMAL LOW (ref 12.0–15.0)
MCH: 28.5 pg (ref 26.0–34.0)
MCHC: 31.6 g/dL (ref 30.0–36.0)
MCV: 90.3 fL (ref 80.0–100.0)
Platelets: 461 10*3/uL — ABNORMAL HIGH (ref 150–400)
RBC: 3.82 MIL/uL — ABNORMAL LOW (ref 3.87–5.11)
RDW: 15.7 % — ABNORMAL HIGH (ref 11.5–15.5)
WBC: 18.9 10*3/uL — ABNORMAL HIGH (ref 4.0–10.5)
nRBC: 0 % (ref 0.0–0.2)

## 2020-10-22 LAB — CBG MONITORING, ED: Glucose-Capillary: 105 mg/dL — ABNORMAL HIGH (ref 70–99)

## 2020-10-22 LAB — POCT URINALYSIS DIP (MANUAL ENTRY)
Bilirubin, UA: NEGATIVE
Glucose, UA: NEGATIVE mg/dL
Ketones, POC UA: NEGATIVE mg/dL
Nitrite, UA: POSITIVE — AB
Protein Ur, POC: >=300 mg/dL — AB
Spec Grav, UA: 1.025 (ref 1.010–1.025)
Urobilinogen, UA: 0.2 E.U./dL
pH, UA: 6.5 (ref 5.0–8.0)

## 2020-10-22 LAB — PROTIME-INR
INR: 1.2 (ref 0.8–1.2)
Prothrombin Time: 14.6 seconds (ref 11.4–15.2)

## 2020-10-22 LAB — LACTIC ACID, PLASMA
Lactic Acid, Venous: 1.2 mmol/L (ref 0.5–1.9)
Lactic Acid, Venous: 1 mmol/L (ref 0.5–1.9)

## 2020-10-22 LAB — RESPIRATORY PANEL BY RT PCR (FLU A&B, COVID)
Influenza A by PCR: NEGATIVE
Influenza B by PCR: NEGATIVE
SARS Coronavirus 2 by RT PCR: NEGATIVE

## 2020-10-22 LAB — APTT: aPTT: 35 seconds (ref 24–36)

## 2020-10-22 LAB — LIPASE, BLOOD: Lipase: 22 U/L (ref 11–51)

## 2020-10-22 LAB — ETHANOL: Alcohol, Ethyl (B): <10 mg/dL (ref <10)

## 2020-10-22 LAB — GLUCOSE, CAPILLARY: Glucose-Capillary: 105 mg/dL — ABNORMAL HIGH (ref 70–99)

## 2020-10-22 LAB — AMMONIA: Ammonia: 12 umol/L (ref 9–35)

## 2020-10-22 MED ORDER — ACETAMINOPHEN 650 MG RE SUPP: 650.0000 mg | Freq: Four times a day (QID) | RECTAL | Status: DC | PRN

## 2020-10-22 MED ORDER — HALOPERIDOL LACTATE 5 MG/ML IJ SOLN
2.0000 mg | Freq: Once | INTRAMUSCULAR | Status: AC
  Administered 2020-10-22: 2 mg via INTRAMUSCULAR
  Filled 2020-10-22: qty 1

## 2020-10-22 MED ORDER — ONDANSETRON HCL 4 MG PO TABS: 4.0000 mg | ORAL_TABLET | Freq: Four times a day (QID) | ORAL | Status: DC | PRN

## 2020-10-22 MED ORDER — SODIUM CHLORIDE 0.9 % IV SOLN: INTRAVENOUS | Status: DC

## 2020-10-22 MED ORDER — ARIPIPRAZOLE 5 MG PO TABS
5.0000 mg | ORAL_TABLET | Freq: Every day | ORAL | Status: DC
  Administered 2020-10-23 – 2020-10-24 (×3): 5 mg via ORAL
  Filled 2020-10-22 (×3): qty 1

## 2020-10-22 MED ORDER — CLONAZEPAM 0.5 MG PO TABS
0.5000 mg | ORAL_TABLET | Freq: Every day | ORAL | Status: DC
  Administered 2020-10-23 – 2020-10-24 (×3): 0.5 mg via ORAL
  Filled 2020-10-22 (×3): qty 1

## 2020-10-22 MED ORDER — ONDANSETRON HCL 4 MG/2ML IJ SOLN: 4.0000 mg | Freq: Four times a day (QID) | INTRAMUSCULAR | Status: DC | PRN

## 2020-10-22 MED ORDER — VANCOMYCIN HCL IN DEXTROSE 1-5 GM/200ML-% IV SOLN
1000.0000 mg | Freq: Once | INTRAVENOUS | Status: DC
  Filled 2020-10-22: qty 200

## 2020-10-22 MED ORDER — SODIUM CHLORIDE 0.9 % IV SOLN
2.0000 g | Freq: Once | INTRAVENOUS | Status: AC
  Administered 2020-10-22: 2 g via INTRAVENOUS
  Filled 2020-10-22: qty 2

## 2020-10-22 MED ORDER — SODIUM CHLORIDE 0.9 % IV BOLUS (SEPSIS)
1000.0000 mL | Freq: Once | INTRAVENOUS | Status: AC
  Administered 2020-10-22: 1000 mL via INTRAVENOUS

## 2020-10-22 MED ORDER — ENOXAPARIN SODIUM 40 MG/0.4ML ~~LOC~~ SOLN
40.0000 mg | SUBCUTANEOUS | Status: DC
  Administered 2020-10-22 – 2020-10-23 (×2): 40 mg via SUBCUTANEOUS
  Filled 2020-10-22 (×3): qty 0.4

## 2020-10-22 MED ORDER — ACETAMINOPHEN 325 MG PO TABS
650.0000 mg | ORAL_TABLET | Freq: Four times a day (QID) | ORAL | Status: DC | PRN
  Administered 2020-10-23 – 2020-10-25 (×5): 650 mg via ORAL
  Filled 2020-10-22 (×5): qty 2

## 2020-10-22 MED ORDER — ACETAMINOPHEN 650 MG RE SUPP
650.0000 mg | Freq: Once | RECTAL | Status: AC
  Administered 2020-10-22: 650 mg via RECTAL
  Filled 2020-10-22: qty 1

## 2020-10-22 MED ORDER — METRONIDAZOLE IN NACL 5-0.79 MG/ML-% IV SOLN
500.0000 mg | Freq: Once | INTRAVENOUS | Status: AC
  Administered 2020-10-22: 500 mg via INTRAVENOUS
  Filled 2020-10-22: qty 100

## 2020-10-22 MED ORDER — SODIUM CHLORIDE 0.9 % IV BOLUS (SEPSIS)
1500.0000 mL | Freq: Once | INTRAVENOUS | Status: AC
  Administered 2020-10-22: 1500 mL via INTRAVENOUS

## 2020-10-22 MED ORDER — SODIUM CHLORIDE 0.9 % IV BOLUS (SEPSIS): 1500.0000 mL | Freq: Once | INTRAVENOUS | Status: DC

## 2020-10-22 MED ORDER — SODIUM CHLORIDE 0.9 % IV SOLN
2.0000 g | Freq: Two times a day (BID) | INTRAVENOUS | Status: DC
  Administered 2020-10-22 – 2020-10-24 (×5): 2 g via INTRAVENOUS
  Filled 2020-10-22 (×6): qty 2

## 2020-10-22 NOTE — ED Provider Notes (Signed)
Medical screening examination/treatment/procedure(s) were conducted as a shared visit with non-physician practitioner(s) and myself.  I personally evaluated the patient during the encounter.  EKG Interpretation  Date/Time:  Tuesday October 22 2020 16:03:46 EST Ventricular Rate:  97 PR Interval:    QRS Duration: 81 QT Interval:  326 QTC Calculation: 415 R Axis:   48 Text Interpretation: Sinus rhythm Consider right atrial enlargement Abnormal R-wave progression, early transition Borderline T abnormalities, anterior leads No significant change since last tracing Confirmed by Lacretia Leigh [phone]) on 10/22/2020 4:55:71 PM  Six 64-year-old female presents with altered mental status.  Has evidence of UTI here.  Code sepsis initiated.  Antibiotics started.  Known history of-year-old stents and will check CT.  Will consult urology.  Patient will be admitted   Lacretia Leigh, MD 10/22/20 1723

## 2020-10-22 NOTE — ED Notes (Signed)
Pt ambulatory to the bathroom and back to room with 1 person assist Pt had no c/o dizziness or SOB Pt stated that she felt a little weak

## 2020-10-22 NOTE — H&P (Signed)
History and Physical    NIAH HEINLE EQA:[id number] DOB: 1956-06-08 DOA: 10/22/2020  PCP: [name], DO   Patient coming from: Home.   I have personally briefly reviewed patient's old medical records in [place]  Chief Complaint: Fever and confusion.   HPI: Mary Stark is a 64 y.o. female with medical history significant of alcohol dependence with unknown usage status, anxiety, osteoarthritis,, nasal skin cancer, depression, GERD, history of hepatitis C treated in 2012, history of urolithiasis, history of rheumatic fever, hyperlipidemia, hypertension, seasonal allergies, vitamin D deficiency who underwent bilateral stent placement in July due to hydronephrosis secondary to retroperitoneal lymphadenopathy who today is brought to the emergency department due to fever and confusion since last night.  Her husband stated that she has also been making statements that does not make any sense like giving furosemide and causing the blood pressure to fall on their cat.  She had an episode of loose stools and did not sleep much as well.  She is normally fully oriented, but is unable to provide further information at this time.  ED Course: Initial vital signs were temperature 99.3 F, but then spiked to 103.3 F, pulse 92, respirations 22, BP 175/108 mmHg O2 sat 100% on 2-1/2 L of oxygen.  She was given aztreonam, metronidazole and vancomycin along with IV fluids.  Labs: Her urinalysis was cloudy with moderate hemoglobinuria, ketonuria 5 and proteinuria 100 mg/dL.  Nitrites was negative.  Leukocyte esterase was large with microscopic examination showing more than 50 RBC, more than 50 WBC and rare bacteria.  White BC clumps, mucus and hyaline casts are present.  CBC showed a white count of 18.9, hemoglobin 10.9 g/dL and platelets 461.  PT/INR/PTT within normal limits.  Sodium is 123, potassium 4.2, chloride 88 CO2 23 mmol/L.  Calcium 8.8, glucose 106, BUN 18 creatinine 1.32 mg/dL total  protein 7.6 and albumin 3.3 g/dL.  The rest of the LFTs are within expected range.  Lipase, ethanol, lactic acid x2 and ammonia were negative.  Blood cultures x2 were drawn.  Imaging:   Her chest radiograph showed improvement in all airspace disease compatible with improved pneumonia.CT head without acute intracranial abnormalities.  CT renal study showed progressive retroperitoneal, left paraspinal and pelvic lymphadenopathy since September compatible with progressive metastatic nodal disease.  Bilateral double-J ureteral stents remain appropriately positioned, however the bilateral renal collecting system remains dilated and abnormal as described in September with underlying urothelial neoplasm.  Please see images and full radiology report for further detail.  Review of Systems: As per HPI otherwise all other systems reviewed and are negative.  Past Medical History:  Diagnosis Date  . Alcohol dependence ([place])   . Anxiety   . Arthritis    Back and lt knee  . Cancer ([place])    skin on nose  . Depression 07/17/2013  . GERD (gastroesophageal reflux disease)   . History of hepatitis C    TX FOR 2012  . History of kidney stones   . History of rheumatic fever AS CHILD   NO PROBLEMS FROM  . Hyperlipidemia 07/17/2013  . Hypertension   . Seasonal allergies   . Toe fracture, right 10/07/2016  . Vitamin D deficiency 07/17/2013    Past Surgical History:  Procedure Laterality Date  . ANAL RECTAL MANOMETRY N/A 01/31/2020   Procedure: ANO RECTAL MANOMETRY;  Surgeon: Arta Silence, MD;  Location: [facility];  Service: Endoscopy;  Laterality: N/A;  . [place]  . CYSTOSCOPY WITH  RETROGRADE PYELOGRAM, URETEROSCOPY AND STENT PLACEMENT Bilateral 06/11/2020   Procedure: CYSTOSCOPY WITH RETROGRADE PYELOGRAM, URETEROSCOPY AND STENT PLACEMENT, RIGHT URETERAL BIOPSY;  Surgeon: Robley Fries, MD;  Location: [facility];  Service: Urology;  Laterality: Bilateral;  90 MINS  . CYSTOSCOPY WITH RETROGRADE  PYELOGRAM, URETEROSCOPY AND STENT PLACEMENT Bilateral 07/05/2020   Procedure: CYSTOSCOPY WITH RETROGRADE PYELOGRAM, URETEROSCOPY,  BIOPSIES AND STENT EXCHANGES;  Surgeon: Robley Fries, MD;  [facility];  Service: Urology;  Laterality: Bilateral;  . FINGER SURGERY Right    5th  . GYNECOLOGIC CRYOSURGERY  YRS AGO  . TONSILLECTOMY  AS CHILD    Social History  reports that she quit smoking about 9 years ago. Her smoking use included cigarettes. She has a 25.00 pack-year smoking history. She has never used smokeless tobacco. She reports previous alcohol use of about 24.0 - 36.0 standard drinks of alcohol per week. She reports that she does not use drugs.  Allergies  Allergen Reactions  . Metformin And Related Other (See Comments)    Lactic acidosis   . Celebrex [Celecoxib] Other (See Comments)    Dizziness, "makes me feel bad"  . Penicillins Hives and Rash    Has patient had a PCN reaction causing immediate rash, facial/tongue/throat swelling, SOB or lightheadedness with hypotension: Yes Has patient had a PCN reaction causing severe rash involving mucus membranes or skin necrosis:No Has patient had a PCN reaction that required hospitalization: Yes Has patient had a PCN reaction occurring within the last 10 years: No If all of the above answers are "NO", then may proceed with Cephalosporin use.     Family History  Problem Relation Age of Onset  . Ovarian cancer Mother   . Cardiomyopathy Father   . Valvular heart disease Father   . Liver cancer Brother   . Protein C deficiency Brother   . Protein C deficiency Brother   . Stroke Brother    Prior to Admission medications   Medication Sig Start Date End Date Taking? Authorizing Provider  acetaminophen (TYLENOL) 500 MG tablet Take 1,000 mg by mouth every 8 (eight) hours as needed for moderate pain.    Yes [provider]  BYSTOLIC 20 MG TABS TAKE 1 TABLET (20 MG TOTAL) BY MOUTH DAILY. 10/17/20    [name], DO  albuterol [facility] HFA) [phone] Base) MCG/ACT inhaler INHALE 1 TO 2 PUFFS BY MOUTH INTO THE LUNGS EVERY 4 HOURS AS NEEDED FOR WHEEZING OR SHORTNESS OF BREATH 08/28/20   [name], DO  amLODipine (NORVASC) 5 MG tablet TAKE 1 TABLET BY MOUTH ONCE DAILY Patient taking differently: Take 5 mg by mouth every evening.  06/13/20   [name], DO  ARIPiprazole (ABILIFY) 5 MG tablet Take 5 mg by mouth at bedtime.     [provider]  ascorbic acid (VITAMIN C) 500 MG tablet Take 1,000 mg by mouth every evening.     [provider]  ASPIRIN LOW DOSE 81 MG EC tablet TAKE 1 TABLET BY MOUTH DAILY. Patient taking differently: Take 81 mg by mouth daily.  04/09/20   [name], DO  Benzphetamine HCl 50 MG TABS Take 50 mg by mouth daily.    [provider]  buPROPion (WELLBUTRIN XL) 300 MG 24 hr tablet Take 1 tablet (300 mg total) by mouth daily. 11/22/19   Plovsky, Berneta Sages, MD  Cholecalciferol (VITAMIN D) 125 MCG (5000 UT) CAPS Take 5,000 Units by mouth every evening.     [provider]  clonazePAM (KLONOPIN) 0.5 MG tablet Take 0.5 mg by mouth at bedtime.  04/15/20   [provider]  Coenzyme Q10 (CO Q-10) 200 MG CAPS Take 200 mg by mouth every evening.     [provider]  DULoxetine (CYMBALTA) 30 MG capsule Take 1 capsule by mouth daily. 09/17/20   [provider]  fexofenadine (ALLEGRA) 180 MG tablet Take 180 mg by mouth every evening.     [provider]  furosemide (LASIX) 20 MG tablet Take 20mg  on day 1 and 2 then every other day. 09/13/20   Orma Render, NP  gabapentin (NEURONTIN) 300 MG capsule 1  qam   4   qhs Patient taking differently: Take 300-1,200 mg by mouth See admin instructions. Take 300 mg by mouth in the morning and afternoon and 1200 mg at night 11/22/19   Plovsky, Berneta Sages, MD  GLUCOSAMINE-CHONDROITIN PO Take 2 tablets by mouth every evening.     [provider]   guaiFENesin-dextromethorphan (ROBITUSSIN DM) 100-10 MG/5ML syrup Take 10 mLs by mouth every 4 (four) hours as needed for cough. 08/28/20   [name], DO  nitrofurantoin, macrocrystal-monohydrate, (MACROBID) 100 MG capsule Take 1 capsule (100 mg total) by mouth 2 (two) times daily. 10/04/20   [name], NP  Omega-3 Fatty Acids (FISH OIL) 1200 MG CAPS Take 1,200 mg by mouth every evening.     [provider]  omeprazole (PRILOSEC) 20 MG capsule Take 1 capsule (20 mg total) by mouth daily. Patient taking differently: Take 20 mg by mouth every evening.  05/30/20   [name], DO  ondansetron (ZOFRAN-ODT) 4 MG disintegrating tablet Take 1 tablet (4 mg total) by mouth every 8 (eight) hours as needed for nausea or vomiting. 06/03/20   Noe Gens, PA-C  phentermine (ADIPEX-P) 37.5 MG tablet Take 75 mg by mouth daily.     [provider]  predniSONE (DELTASONE) 10 MG tablet 4 tabs for 2 days 3 tabs for 2 days 2 tabs for 2 days 1 tab for 2 days and continue your current therapy 08/21/20   Barb Merino, MD  SAXENDA 18 MG/3ML SOPN Inject 3 mg into the skin daily.  02/28/19   [provider]  simvastatin (ZOCOR) 20 MG tablet TAKE 1 TABLET (20 MG TOTAL) BY MOUTH DAILY. 09/19/20   [name], DO  tamsulosin (FLOMAX) 0.4 MG CAPS capsule Take 0.4 mg by mouth at bedtime. 08/07/20   [provider]  tolterodine (DETROL LA) 4 MG 24 hr capsule Take 4 mg by mouth daily. 07/12/20   [provider]  vitamin B-12 (CYANOCOBALAMIN) 1000 MCG tablet Take 2,000 mcg by mouth every evening.     [provider]  Vitamin E 180 MG (400 UNIT) CAPS Take 400 Units by mouth every evening.     [provider]   Physical Exam: Vitals:   10/22/20 1845 10/22/20 1900 10/22/20 2001 10/22/20 2015  BP: (!) 157/99  (!) 141/84 (!) 156/89  Pulse: 88 86 88 88  Resp: 20 (!) 29 16 (!) 27  Temp:      TempSrc:      SpO2: 100% 100% 97% 99%  Weight:       Height:       Constitutional: Looks acutely ill, but nontoxic. Eyes: PERRL, lids and conjunctivae mildly injected. ENMT: Mucous membranes are dry. Posterior pharynx clear of any exudate or lesions. Neck: normal, supple, no masses, no thyromegaly Respiratory: Decreased breath sounds on bases, otherwise clear to auscultation  bilaterally, no wheezing, no crackles. Normal respiratory effort. No accessory muscle use.  Cardiovascular: Regular rate and rhythm, no murmurs / rubs / gallops. No extremity edema. 2+ pedal pulses. No carotid bruits.  Abdomen: Obese, nondistended. Bowel sounds positive.  Soft, no tenderness on palpation, left CVA tenderness on percussion, no masses palpated. No hepatosplenomegaly.  Musculoskeletal: no clubbing / cyanosis. Good ROM, no contractures. Normal muscle tone.  Skin: no rashes, lesions, ulcers on very limited dermatological examination. Neurologic: Grossly nonfocal.  Does not cooperate or follow simple commands. Psychiatric: Oriented to name only.  Mildly restless.   Labs on Admission: I have personally reviewed following labs and imaging studies  CBC: Recent Labs  Lab 10/22/20 1440  WBC 18.9*  HGB 10.9*  HCT 34.5*  MCV 90.3  PLT 449*   Basic Metabolic Panel: Recent Labs  Lab 10/22/20 1440  NA 123*  K 4.2  CL 88*  CO2 23  GLUCOSE 106*  BUN 18  CREATININE 1.32*  CALCIUM 8.8*   GFR: Estimated Creatinine Clearance: 48.3 mL/min (A) (by C-G formula based on SCr of 1.32 mg/dL (H)).  Liver Function Tests: Recent Labs  Lab 10/22/20 1440  AST 21  ALT 11  ALKPHOS 70  BILITOT 0.5  PROT 10.6*  ALBUMIN 3.3*   Urine analysis:    Component Value Date/Time   COLORURINE YELLOW 10/22/2020 1657   APPEARANCEUR CLOUDY (A) 10/22/2020 1657   LABSPEC 1.017 10/22/2020 1657   PHURINE 6.0 10/22/2020 1657   GLUCOSEU NEGATIVE 10/22/2020 1657   HGBUR MODERATE (A) 10/22/2020 1657   BILIRUBINUR NEGATIVE 10/22/2020 1657   BILIRUBINUR negative 10/22/2020  1305   KETONESUR 5 (A) 10/22/2020 1657   KETONESUR negative 10/22/2020 1305   PROTEINUR 100 (A) 10/22/2020 1657   PROTEINUR >=300 (A) 10/22/2020 1305   UROBILINOGEN 0.2 10/22/2020 1305   NITRITE NEGATIVE 10/22/2020 1657   NITRITE Positive (A) 10/22/2020 1305   LEUKOCYTESUR LARGE (A) 10/22/2020 1657   LEUKOCYTESUR Small (1+) (A) 10/22/2020 1305   Radiological Exams on Admission: CT Head Wo Contrast  Result Date: 10/22/2020 CLINICAL DATA:  Delirium, sepsis EXAM: CT HEAD WITHOUT CONTRAST TECHNIQUE: Contiguous axial images were obtained from the base of the skull through the vertex without intravenous contrast. COMPARISON:  None. FINDINGS: Brain: No acute infarct or hemorrhage. Lateral ventricles and midline structures are unremarkable. There are no acute extra-axial fluid collections. There is no mass effect. Vascular: No hyperdense vessel or unexpected calcification. Skull: Normal. Negative for fracture or focal lesion. Sinuses/Orbits: No acute finding. Other: None. IMPRESSION: 1. No acute intracranial process. Electronically Signed   By: Randa Ngo M.D.   On: 10/22/2020 18:24   DG Chest Port 1 View  Result Date: 10/22/2020 CLINICAL DATA:  Sepsis, confusion, fever EXAM: PORTABLE CHEST 1 VIEW COMPARISON:  08/29/2020 FINDINGS: Single frontal view of the chest demonstrates an unremarkable cardiac silhouette. Diffuse interstitial prominence is again seen throughout the lungs, with near complete resolution of the bilateral airspace disease seen previously. There is minimal residual ground-glass opacity at the right lung base. No effusion or pneumothorax. No acute bony abnormalities. IMPRESSION: 1. Marked improvement in the bilateral airspace disease seen previously, compatible with improved pneumonia. 2. Diffuse interstitial prominence persists, likely postinflammatory scarring. Electronically Signed   By: Randa Ngo M.D.   On: 10/22/2020 16:57   CT Renal Stone Study  Result Date:  10/22/2020 CLINICAL DATA:  64 year old female with sepsis. Bilateral ureteral stents. Suspicion of urothelial neoplasm on September CT without and with contrast. EXAM: CT ABDOMEN  AND PELVIS WITHOUT CONTRAST TECHNIQUE: Multidetector CT imaging of the abdomen and pelvis was performed following the standard protocol without IV contrast. COMPARISON:  [facility] CT Abdomen and Pelvis 09/09/2020 and earlier. FINDINGS: Lower chest: Small layering left pleural effusion is new since September. And there are increased left paraspinal and/or retrocrural soft tissue nodules (series 2, image 15) measuring up to 15 mm short axis now, versus 9-10 mm in September. No pericardial effusion. No right pleural effusion. Chronic appearing superimposed bilateral lung base disease with architectural distortion and reticular opacities. No definite acute lung base nodule. Hepatobiliary: Negative noncontrast liver and gallbladder. Pancreas: Negative. Spleen: Negative.  Stable splenule (normal variant). Adrenals/Urinary Tract: Both adrenal glands are now indistinct. Increased bilateral pararenal space soft tissue thickening and inflammatory stranding. Bilateral double-J ureteral stents remain in place and appear appropriately position. However, bilateral hydronephrosis persists and the right renal collecting system is abnormally hyperdense as before. Stranding continues along the course of both ureters to the bladder. Perivesical stranding a has not significantly changed. No discrete bladder mass. Stomach/Bowel: No dilated large or small bowel loops. There is scattered fluid in the colon. No definite acute bowel inflammation. No free air. No free fluid. Vascular/Lymphatic: Vascular patency is not evaluated in the absence of IV contrast. Normal caliber abdominal aorta. Aortoiliac calcified atherosclerosis. Progressed and now confluent bilateral Peri aortic lymphadenopathy at the level of the kidneys (series 2, image 36) with  bilateral lymph node conglomeration is up to 2.5 cm diameter (previously 1.5 cm). Lymphadenopathy tracks caudally in the retroperitoneum to the iliac node stations. Reproductive: Stable, negative noncontrast appearance. Other: Stable presacral stranding since September. No pelvic free fluid. Musculoskeletal: No acute or suspicious osseous lesion identified. IMPRESSION: 1. Progressed retroperitoneal, left paraspinal, and pelvic lymphadenopathy since September compatible with progressive metastatic nodal disease. 2. Bilateral double-J ureteral stents remain appropriately positioned, however, the bilateral renal collecting systems remain dilated and abnormal as described in September, with underlying Urothelial Neoplasm. Persistent bilateral urinary inflammation. 3. New small layering left pleural effusion.  Chronic lung disease. 4. Aortic Atherosclerosis (ICD10-I70.0). Electronically Signed   By: Genevie Ann M.D.   On: 10/22/2020 18:31   EKG: Independently reviewed. Vent. rate 97 BPM PR interval * ms QRS duration 81 ms QT/QTc 326/415 ms P-R-T axes 57 48 53 Sinus rhythm Consider right atrial enlargement Abnormal R-wave progression, early transition Borderline T abnormalities, anterior leads  Assessment/Plan Principal Problem:   Sepsis secondary to UTI ([place])  Admit to telemetry/inpatient. Continue IV fluids. Aztreonam on per pharmacy. Follow-up urine culture and sensitivity. Follow blood culture and sensitivity. Seen earlier by urology. No need for urologic intervention at this time.  Active Problems: Will need full med reconciliation in a.m.   Dyslipidemia, goal LDL below 100 Resume simvastatin.    Depression with anxiety Resume Abilify and clonazepam at bedtime.    History of alcohol dependence ([place]) Monitor for signs of withdrawal. Initiate CIWA protocol if highly suspicious of withdrawal.    Tobacco abuse Nicotine replacement therapy as needed.    Essential hypertension Resume  amlodipine 5 mg p.o. daily. We will need full med rec in a.m. Monitor blood pressure.    Hyponatremia Continue IV fluids. Hold diuretic. Follow-up sodium level.    Hydronephrosis Seen by urology. Continue current treatment.    Normocytic anemia Monitor H&H.    Hyperproteinemia Likely hemoconcentrated and hypoalbuminemic. Recheck total protein and albumin in a.m. Consult dietitian once less confused.   DVT prophylaxis: Lovenox SQ. Code Status:   Full code.  Family Communication: Disposition Plan:   Patient is from:  Home.  Anticipated DC to:  TBD.  Anticipated DC date:  10/25/2020 or 10/26/2020.  Anticipated DC barriers: Clinical status.  Consults called:  Urology Louis Meckel, MD). Admission status:  Inpatient/telemetry.   Severity of Illness: High due to sepsis secondary to UTI related to bilateral stents due to hydronephrosis secondary to retroperitoneal lymphadenopathy.  The patient will likely need 2 to 3 days of IV antibiotics in the hospital.  Reubin Milan MD [facility]  How to contact the [facility] Attending or Consulting provider [place] or covering provider during after hours [place], for this patient?   1. Check the care team in [facility] and look for a) attending/consulting [facility] provider listed and b) the [facility] team listed 2. Log into [email or web address] and use [place]'s universal password to access. If you do not have the password, please contact the hospital operator. 3. Locate the [facility] provider you are looking for under [facility] and page to a number that you can be directly reached. 4. If you still have difficulty reaching the provider, please page the [facility] (Director on Call) for the Hospitalists listed on amion for assistance.  10/22/2020, 8:28 PM   This document was prepared using Dragon voice recognition software and may contain some unintended digital transcription errors.

## 2020-10-22 NOTE — Progress Notes (Signed)
Following Code Sepsis.

## 2020-10-22 NOTE — Progress Notes (Signed)
A consult was received from an ED physician for aztreonam and vancomycin per pharmacy dosing (for an indication other than meningitis). The patient's profile has been reviewed for ht/wt/allergies/indication/available labs. A one time order has been placed for the above antibiotics.  Further antibiotics/pharmacy consults should be ordered by admitting physician if indicated.        Note PCN allergy listed as hives/rash requiring hospitalization; will proceed with aztreonam for now, but if continued on admission, further investigation of allergy may be warranted.                 Reuel Boom, PharmD, BCPS [phone] 10/22/2020, 4:08 PM

## 2020-10-22 NOTE — H&P (Signed)
I have been asked to see the patient by Dr. Lacretia Leigh, for evaluation and management of UTI/sepsis.  History of present illness: 64 year old female who presented to the emergency department with fever and confusion.  She has a history of retroperitoneal lymphadenopathy and hydronephrosis.  She has bilateral stents, these were placed at the end of July by Dr. Claudia Desanctis.  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  Was then recommended that she have a biopsy of these nodes from interventional radiology.  She is scheduled for that on Friday.  Over the course of the last couple days the patient has started to feel lethargic and confused.  Last night she had a temperature of 101.  She had severe urinary frequency and urgency.  She has had intermittent dysuria now for 3 months that she has had the stent, and does not think that this was any worse.  She has not had any hematuria.  She is mostly complaining of periumbilical pain.  In the emergency department the patient was noted to have a fever of 103 Fahrenheit.  Her white blood cell count was 18,000.  Her urine had significant amount of white blood cells and bacteria, but was nitrite negative.  Her lactate was 1.0.  A CT scan was performed which demonstrated some edema within the left kidney and some persistent hydronephrosis and worsening lymphadenopathy in the retroperitoneum.  However, there was no significant change when compared to the CT scan in September.  Review of systems: A 12 point comprehensive review of systems was obtained and is negative unless otherwise stated in the history of present illness.  Patient Active Problem List   Diagnosis Date Noted  . Hydronephrosis 10/01/2020  . Proteinuria 10/01/2020  . Abnormal CT of the abdomen 10/01/2020  . Bilateral lower extremity edema 09/12/2020  . Acute hypoxemic respiratory failure due to COVID-19 ([place])  08/19/2020  . Hyponatremia 08/19/2020  . Tear of medial meniscus of left knee, current 07/13/2019  . Encounter for monitoring NSAID therapy 07/13/2019  . Internal derangement of knee involving posterior horn of lateral meniscus, left 07/13/2019  . Primary osteoarthritis of left knee 07/13/2019  . Renal insufficiency 07/13/2019  . Anxiety about health 03/22/2019  . Hypokalemia 03/21/2019  . Chronic pain of left knee 02/13/2019  . Essential hypertension 02/13/2019  . 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/2018  . Anemia due to blood loss, acute 04/14/2018  . Hypomagnesemia 04/14/2018  . Hypocalcemia 04/14/2018  . Tobacco abuse 04/08/2018  . Depression 04/07/2018  . AKI (acute kidney injury) ([place]) 04/07/2018  . Sepsis ([place]) 04/07/2018  . Acute colitis 04/07/2018  . Avascular necrosis of humeral head, right ([place]) 03/30/2018  . NSAID long-term use 03/30/2018  . Encounter for weight loss counseling 03/30/2018  . Seborrheic keratoses 03/15/2018  . History of nonmelanoma skin cancer 03/02/2018  . Skin lesion of chest wall 03/02/2018  . Alcohol dependence with unspecified alcohol-induced disorder ([place]) 02/11/2018  . Severe episode of recurrent major depressive disorder, without psychotic features ([place]) 02/11/2018  . Transaminitis 01/06/2018  . Nicotine dependence 01/06/2018  . Heavy alcohol consumption 01/06/2018  . Encounter for monitoring statin therapy 01/06/2018  . Class 1 obesity due to excess calories with serious comorbidity in adult 01/06/2018  . Chronic pain syndrome 04/21/2017  . Chronic right shoulder pain 07/03/2015  . Vaginismus 04/12/2014  . Menopausal state 11/08/2013  . Family history of ovarian cancer 09/27/2013  . 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 Personal  history of colonic polyps 07/18/2013  . Dyslipidemia, goal LDL below 100 07/17/2013  . Depression with anxiety 07/17/2013  . Hx of hepatitis C 07/17/2013  . Vitamin D deficiency 07/17/2013  . Right lumbar radiculopathy  07/17/2013  . History of alcohol dependence ([place]) 07/17/2013  . H/O: rheumatic fever 06/30/2012  . Insomnia 06/30/2012    No current facility-administered medications on file prior to encounter.   Current Outpatient Medications on File Prior to Encounter  Medication Sig Dispense Refill  . acetaminophen (TYLENOL) 500 MG tablet Take 1,000 mg by mouth every 8 (eight) hours as needed for moderate pain.     Marland Kitchen BYSTOLIC 20 MG TABS TAKE 1 TABLET (20 MG TOTAL) BY MOUTH DAILY. 90 tablet 1  . albuterol (PROAIR HFA) 108 (90 Base) MCG/ACT inhaler INHALE 1 TO 2 PUFFS BY MOUTH INTO THE LUNGS EVERY 4 HOURS AS NEEDED FOR WHEEZING OR SHORTNESS OF BREATH 8.5 g 1  . amLODipine (NORVASC) 5 MG tablet TAKE 1 TABLET BY MOUTH ONCE DAILY (Patient taking differently: Take 5 mg by mouth every evening. ) 90 tablet 1  . ARIPiprazole (ABILIFY) 5 MG tablet Take 5 mg by mouth at bedtime.     Marland Kitchen ascorbic acid (VITAMIN C) 500 MG tablet Take 1,000 mg by mouth every evening.     . ASPIRIN LOW DOSE 81 MG EC tablet TAKE 1 TABLET BY MOUTH DAILY. (Patient taking differently: Take 81 mg by mouth daily. ) 90 tablet 3  . Benzphetamine HCl 50 MG TABS Take 50 mg by mouth daily.    Marland Kitchen buPROPion (WELLBUTRIN XL) 300 MG 24 hr tablet Take 1 tablet (300 mg total) by mouth daily. 90 tablet 1  . Cholecalciferol (VITAMIN D) 125 MCG (5000 UT) CAPS Take 5,000 Units by mouth every evening.     . clonazePAM (KLONOPIN) 0.5 MG tablet Take 0.5 mg by mouth at bedtime.     . Coenzyme Q10 (CO Q-10) 200 MG CAPS Take 200 mg by mouth every evening.     . DULoxetine (CYMBALTA) 30 MG capsule Take 1 capsule by mouth daily.    . fexofenadine (ALLEGRA) 180 MG tablet Take 180 mg by mouth every evening.     . furosemide (LASIX) 20 MG tablet Take 20mg  on day 1 and 2 then every other day. 30 tablet 3  . gabapentin (NEURONTIN) 300 MG capsule 1  qam   4   qhs (Patient taking differently: Take 300-1,200 mg by mouth See admin instructions. Take 300 mg by mouth in the  morning and afternoon and 1200 mg at night) 150 capsule 5  . GLUCOSAMINE-CHONDROITIN PO Take 2 tablets by mouth every evening.     Marland Kitchen guaiFENesin-dextromethorphan (ROBITUSSIN DM) 100-10 MG/5ML syrup Take 10 mLs by mouth every 4 (four) hours as needed for cough. 473 mL 0  . nitrofurantoin, macrocrystal-monohydrate, (MACROBID) 100 MG capsule Take 1 capsule (100 mg total) by mouth 2 (two) times daily. 10 capsule 0  . Omega-3 Fatty Acids (FISH OIL) 1200 MG CAPS Take 1,200 mg by mouth every evening.     Marland Kitchen omeprazole (PRILOSEC) 20 MG capsule Take 1 capsule (20 mg total) by mouth daily. (Patient taking differently: Take 20 mg by mouth every evening. ) 90 capsule 1  . ondansetron (ZOFRAN-ODT) 4 MG disintegrating tablet Take 1 tablet (4 mg total) by mouth every 8 (eight) hours as needed for nausea or vomiting. 12 tablet 0  . phentermine (ADIPEX-P) 37.5 MG tablet Take 75 mg by mouth daily.     Marland Kitchen  predniSONE (DELTASONE) 10 MG tablet 4 tabs for 2 days 3 tabs for 2 days 2 tabs for 2 days 1 tab for 2 days and continue your current therapy 20 tablet 0  . SAXENDA 18 MG/3ML SOPN Inject 3 mg into the skin daily.     . simvastatin (ZOCOR) 20 MG tablet TAKE 1 TABLET (20 MG TOTAL) BY MOUTH DAILY. 90 tablet 1  . tamsulosin (FLOMAX) 0.4 MG CAPS capsule Take 0.4 mg by mouth at bedtime.    . tolterodine (DETROL LA) 4 MG 24 hr capsule Take 4 mg by mouth daily.    . vitamin B-12 (CYANOCOBALAMIN) 1000 MCG tablet Take 2,000 mcg by mouth every evening.     . Vitamin E 180 MG (400 UNIT) CAPS Take 400 Units by mouth every evening.       Past Medical History:  Diagnosis Date  . Alcohol dependence ([place])   . Anxiety   . Arthritis    Back and lt knee  . Cancer ([place])    skin on nose  . Depression 07/17/2013  . GERD (gastroesophageal reflux disease)   . History of hepatitis C    TX FOR 2012  . History of kidney stones   . History of rheumatic fever AS CHILD   NO PROBLEMS FROM  . Hyperlipidemia 07/17/2013  . Hypertension   .  Seasonal allergies   . Toe fracture, right 10/07/2016  . Vitamin D deficiency 07/17/2013    Past Surgical History:  Procedure Laterality Date  . ANAL RECTAL MANOMETRY N/A 01/31/2020   Procedure: ANO RECTAL MANOMETRY;  Surgeon: Arta Silence, MD;  Location: [facility];  Service: Endoscopy;  Laterality: N/A;  . [place]  . CYSTOSCOPY WITH RETROGRADE PYELOGRAM, URETEROSCOPY AND STENT PLACEMENT Bilateral 06/11/2020   Procedure: CYSTOSCOPY WITH RETROGRADE PYELOGRAM, URETEROSCOPY AND STENT PLACEMENT, RIGHT URETERAL BIOPSY;  Surgeon: Robley Fries, MD;  Location: [facility];  Service: Urology;  Laterality: Bilateral;  90 MINS  . CYSTOSCOPY WITH RETROGRADE PYELOGRAM, URETEROSCOPY AND STENT PLACEMENT Bilateral 07/05/2020   Procedure: CYSTOSCOPY WITH RETROGRADE PYELOGRAM, URETEROSCOPY,  BIOPSIES AND STENT EXCHANGES;  Surgeon: Robley Fries, MD;  Location: [facility];  Service: Urology;  Laterality: Bilateral;  . FINGER SURGERY Right    5th  . GYNECOLOGIC CRYOSURGERY  YRS AGO  . TONSILLECTOMY  AS CHILD    Social History   Tobacco Use  . Smoking status: Former Smoker    Packs/day: 1.00    Years: 25.00    Pack years: 25.00    Types: Cigarettes    Quit date: 2012    Years since quitting: 9.8  . Smokeless tobacco: Never Used  Vaping Use  . Vaping Use: Every day  . Substances: Nicotine  Substance Use Topics  . Alcohol use: Not Currently    Alcohol/week: 24.0 - 36.0 standard drinks    Types: 24 - 36 Cans of beer per week    Comment: Pt reports that she stopped march 2021  . Drug use: No    Family History  Problem Relation Age of Onset  . Ovarian cancer Mother   . Cardiomyopathy Father   . Valvular heart disease Father   . Liver cancer Brother   . Protein C deficiency Brother   . Protein C deficiency Brother   . Stroke Brother     PE: Vitals:   10/22/20 1830 10/22/20 1834 10/22/20 1845 10/22/20 1900  BP: (!) 151/91  (!) 157/99   Pulse: 88  88 86  Resp: (!) 23  20 (!) 29  Temp:  99.2 F (37.3 C)    TempSrc:  Oral    SpO2: 96%  100% 100%  Weight:      Height:       Patient appears to be in no acute distress  patient is alert and oriented x3 Atraumatic normocephalic head No cervical or supraclavicular lymphadenopathy appreciated No increased work of breathing, no audible wheezes/rhonchi Regular sinus rhythm/rate Abdomen is soft, nontender, nondistended, the patient has left CVA tenderness Lower extremities are symmetric without appreciable edema Grossly neurologically intact No identifiable skin lesions  Recent Labs    10/22/20 1440  WBC 18.9*  HGB 10.9*  HCT 34.5*   Recent Labs    10/22/20 1440  NA 123*  K 4.2  CL 88*  CO2 23  GLUCOSE 106*  BUN 18  CREATININE 1.32*  CALCIUM 8.8*   Recent Labs    10/22/20 1625  INR 1.2   No results for input(s): LABURIN in the last 72 hours. Results for orders placed or performed in visit on 10/04/20  Urine Culture     Status: None   Collection Time: 10/04/20  2:25 PM   Specimen: Urine  Result Value Ref Range Status   MICRO NUMBER: [id number]  Final   SPECIMEN QUALITY: Adequate  Final   Sample Source URINE  Final   STATUS: FINAL  Final   Result:   Final    Less than 10,000 CFU/mL of single Gram positive organism isolated. No further testing will be performed. If clinically indicated, recollection using a method to minimize contamination, with prompt transfer to Urine Culture Transport Tube, is recommended.    Imaging: I have independently reviewed the images and discussed them with the patient.  The findings are as noted in the history of present illness.  She has some progressive retroperitoneal lymphadenopathy and persistent left hydroureter nephrosis.  These findings are similar to the findings from the scan 6 weeks prior.  She does have some left perinephric stranding and edema consistent with pyelonephritis.  Imp: The patient has an unknown process contributing to  her retroperitoneal lymphadenopathy and hydronephrosis.  Her stents appear to be in good position and her CT scan reflects no significant worsening of her hydronephrosis.  Her findings to me are most consistent with acute pyelonephritis.  Recommendations: At this point, I do not recommend stent exchanges as they appear to be in good position and appear to be functioning.  We will treat the patient for pyelonephritis and keep a close eye on her.  If she does not improve, then we would strongly consider changing her stents.  We will continue to follow while the patient is hospitalized.  I will let Dr. Claudia Desanctis know of her admission.  Ardis Hughs

## 2020-10-22 NOTE — ED Notes (Signed)
Patient is being discharged from the Urgent Care and sent to the Emergency Department via [place] with her husband . Pt is on 2.5 L O2 via .Per Dr Assunta Found, patient is in need of higher level of care due to potential for urosepsis and change in mentation. Patient's spouse is aware and verbalizes understanding of plan of care. Pt unable to verbalize who her urologist is but believes it is Dr Claudia Desanctis with [facility].Report called to Marzetta Board, charge RN  @ [facility] ED. Vitals:   10/22/20 1224  BP: 126/83  Pulse: 94  Temp: 99.7 F (37.6 C)  SpO2: 99%

## 2020-10-22 NOTE — ED Notes (Signed)
Bed alarm Yellow socks Fall risk arm band Fall risk on door

## 2020-10-22 NOTE — Progress Notes (Signed)
Pharmacy Antibiotic Note  Mary Stark is a 64 y.o. female admitted on 10/22/2020 with urosepsis.  Pharmacy has been consulted for aztreonam dosing. Patient has PCN allergy documented with hives/rash requiring hospitalization. Upon further questioning however, patient (who is alert and oriented) has no recollection of ever having an allergy to PCNs, and reports taking PCN and amoxicillin without incident.  Plan:  Per protocol, will convert aztreonam to cefepime 2g IV q12 hr  Will remove PCN allergy in Mary Stark will sign off, following peripherally for culture results and renal adjustments  Height: 5\' 6"  (167.6 cm) Weight: 86.2 kg (190 lb) IBW/kg (Calculated) : 59.3  Temp (24hrs), Avg:100 F (37.8 C), Min:98.4 F (36.9 C), Max:103.3 F (39.6 C)  Recent Labs  Lab 10/22/20 1440 10/22/20 1546 10/22/20 1850  WBC 18.9*  --   --   CREATININE 1.32*  --   --   LATICACIDVEN  --  1.2 1.0    Estimated Creatinine Clearance: 48.3 mL/min (A) (by C-G formula based on SCr of 1.32 mg/dL (H)).    Allergies  Allergen Reactions  . Metformin And Related Other (See Comments)    Lactic acidosis   . Celebrex [Celecoxib] Other (See Comments)    Dizziness, "makes me feel bad"     Thank you for allowing pharmacy to be a part of this patient's care.  Mary Stark A 10/22/2020 9:51 PM

## 2020-10-22 NOTE — ED Notes (Signed)
Patient off the floor CT scan

## 2020-10-22 NOTE — ED Triage Notes (Signed)
Confused, nocturia, fever started last night. This morning trying to drink out of the wipes canister at home.

## 2020-10-22 NOTE — ED Notes (Signed)
Patient had a small bowel movement on bedpan

## 2020-10-22 NOTE — ED Provider Notes (Signed)
[facility] DEPT Provider Note   CSN: [id number] Arrival date & time: 10/22/20  1359     History Chief Complaint  Patient presents with  . Altered Mental Status    Mary Stark is a 64 y.o. female with a past medical history of alcohol dependence, hypertension, hyperlipidemia, hepatitis C status post treatment, bilateral ureteral stents in place over a month ago who presents today for evaluation of altered mental status.  History is primarily obtained from patient's husband.  Patient was normal yesterday morning, however when he got home last night she was confused.  She was making nonsensical statements including saying that she had given the Lasix on the kitchen floor which caused the cat's blood pressure to drop.  She also was unable to figure out how to work her phone.  Husband reports that she is normally A and O x4.  She reportedly had diarrhea last night and slept poorly according to husband.    Level 5 caveat for altered mental status  HPI     Past Medical History:  Diagnosis Date  . Alcohol dependence ([place])   . Anxiety   . Arthritis    Back and lt knee  . Cancer ([place])    skin on nose  . Depression 07/17/2013  . GERD (gastroesophageal reflux disease)   . History of hepatitis C    TX FOR 2012  . History of kidney stones   . History of rheumatic fever AS CHILD   NO PROBLEMS FROM  . Hyperlipidemia 07/17/2013  . Hypertension   . Seasonal allergies   . Toe fracture, right 10/07/2016  . Vitamin D deficiency 07/17/2013    Patient Active Problem List   Diagnosis Date Noted  . Hydronephrosis 10/01/2020  . Proteinuria 10/01/2020  . Abnormal CT of the abdomen 10/01/2020  . Bilateral lower extremity edema 09/12/2020  . Acute hypoxemic respiratory failure due to COVID-19 ([place]) 08/19/2020  . Hyponatremia 08/19/2020  . Tear of medial meniscus of left knee, current 07/13/2019  . Encounter for monitoring NSAID therapy 07/13/2019  . Internal  derangement of knee involving posterior horn of lateral meniscus, left 07/13/2019  . Primary osteoarthritis of left knee 07/13/2019  . Renal insufficiency 07/13/2019  . Anxiety about health 03/22/2019  . Hypokalemia 03/21/2019  . Chronic pain of left knee 02/13/2019  . Essential hypertension 02/13/2019  . 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/2018  . Anemia due to blood loss, acute 04/14/2018  . Hypomagnesemia 04/14/2018  . Hypocalcemia 04/14/2018  . Tobacco abuse 04/08/2018  . Depression 04/07/2018  . AKI (acute kidney injury) ([place]) 04/07/2018  . Sepsis ([place]) 04/07/2018  . Acute colitis 04/07/2018  . Avascular necrosis of humeral head, right ([place]) 03/30/2018  . NSAID long-term use 03/30/2018  . Encounter for weight loss counseling 03/30/2018  . Seborrheic keratoses 03/15/2018  . History of nonmelanoma skin cancer 03/02/2018  . Skin lesion of chest wall 03/02/2018  . Alcohol dependence with unspecified alcohol-induced disorder ([place]) 02/11/2018  . Severe episode of recurrent major depressive disorder, without psychotic features ([place]) 02/11/2018  . Transaminitis 01/06/2018  . Nicotine dependence 01/06/2018  . Heavy alcohol consumption 01/06/2018  . Encounter for monitoring statin therapy 01/06/2018  . Class 1 obesity due to excess calories with serious comorbidity in adult 01/06/2018  . Chronic pain syndrome 04/21/2017  . Chronic right shoulder pain 07/03/2015  . Vaginismus 04/12/2014  . Menopausal state 11/08/2013  . Family history of ovarian cancer 09/27/2013  . 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 Personal  history of colonic polyps 07/18/2013  . Dyslipidemia, goal LDL below 100 07/17/2013  . Depression with anxiety 07/17/2013  . Hx of hepatitis C 07/17/2013  . Vitamin D deficiency 07/17/2013  . Right lumbar radiculopathy 07/17/2013  . History of alcohol dependence ([place]) 07/17/2013  . H/O: rheumatic fever 06/30/2012  . Insomnia 06/30/2012    Past Surgical History:  Procedure  Laterality Date  . ANAL RECTAL MANOMETRY N/A 01/31/2020   Procedure: ANO RECTAL MANOMETRY;  Surgeon: Arta Silence, MD;  Location: [facility];  Service: Endoscopy;  Laterality: N/A;  . [place]  . CYSTOSCOPY WITH RETROGRADE PYELOGRAM, URETEROSCOPY AND STENT PLACEMENT Bilateral 06/11/2020   Procedure: CYSTOSCOPY WITH RETROGRADE PYELOGRAM, URETEROSCOPY AND STENT PLACEMENT, RIGHT URETERAL BIOPSY;  Surgeon: Robley Fries, MD;  Location: [facility];  Service: Urology;  Laterality: Bilateral;  90 MINS  . CYSTOSCOPY WITH RETROGRADE PYELOGRAM, URETEROSCOPY AND STENT PLACEMENT Bilateral 07/05/2020   Procedure: CYSTOSCOPY WITH RETROGRADE PYELOGRAM, URETEROSCOPY,  BIOPSIES AND STENT EXCHANGES;  Surgeon: Robley Fries, MD;  Location[facility];  Service: Urology;  Laterality: Bilateral;  . FINGER SURGERY Right    5th  . GYNECOLOGIC CRYOSURGERY  YRS AGO  . TONSILLECTOMY  AS CHILD     OB History   No obstetric history on file.     Family History  Problem Relation Age of Onset  . Ovarian cancer Mother   . Cardiomyopathy Father   . Valvular heart disease Father   . Liver cancer Brother   . Protein C deficiency Brother   . Protein C deficiency Brother   . Stroke Brother     Social History   Tobacco Use  . Smoking status: Former Smoker    Packs/day: 1.00    Years: 25.00    Pack years: 25.00    Types: Cigarettes    Quit date: 2012    Years since quitting: 9.8  . Smokeless tobacco: Never Used  Vaping Use  . Vaping Use: Every day  . Substances: Nicotine  Substance Use Topics  . Alcohol use: Not Currently    Alcohol/week: 24.0 - 36.0 standard drinks    Types: 24 - 36 Cans of beer per week    Comment: Pt reports that she stopped march 2021  . Drug use: No    Home Medications Prior to Admission medications   Medication Sig Start Date End Date Taking? Authorizing Provider  BYSTOLIC 20 MG TABS TAKE 1 TABLET (20 MG TOTAL) BY MOUTH DAILY. 10/17/20    [name], DO  acetaminophen (TYLENOL) 500 MG tablet Take 1,000 mg by mouth every 8 (eight) hours as needed for moderate pain.     [provider]  albuterol (PROAIR HFA) 108 (90 Base) MCG/ACT inhaler INHALE 1 TO 2 PUFFS BY MOUTH INTO THE LUNGS EVERY 4 HOURS AS NEEDED FOR WHEEZING OR SHORTNESS OF BREATH 08/28/20   [name], DO  amLODipine (NORVASC) 5 MG tablet TAKE 1 TABLET BY MOUTH ONCE DAILY Patient taking differently: Take 5 mg by mouth every evening.  06/13/20   [name], DO  ARIPiprazole (ABILIFY) 5 MG tablet Take 5 mg by mouth at bedtime.     [provider]  ascorbic acid (VITAMIN C) 500 MG tablet Take 1,000 mg by mouth every evening.     [provider]  ASPIRIN LOW DOSE 81 MG EC tablet TAKE 1 TABLET BY MOUTH DAILY. Patient taking differently: Take 81 mg by mouth daily.  04/09/20   [name], DO  Benzphetamine HCl 50 MG TABS Take 50 mg by mouth daily.    [provider]  buPROPion (WELLBUTRIN XL) 300 MG 24 hr tablet Take 1 tablet (300 mg total) by mouth daily. 11/22/19   Plovsky, Berneta Sages, MD  Cholecalciferol (VITAMIN D) 125 MCG (5000 UT) CAPS Take 5,000 Units by mouth every evening.     [provider]  clonazePAM (KLONOPIN) 0.5 MG tablet Take 0.5 mg by mouth at bedtime.  04/15/20   [provider]  Coenzyme Q10 (CO Q-10) 200 MG CAPS Take 200 mg by mouth every evening.     [provider]  DULoxetine (CYMBALTA) 30 MG capsule Take 1 capsule by mouth daily. 09/17/20   [provider]  fexofenadine (ALLEGRA) 180 MG tablet Take 180 mg by mouth every evening.     [provider]  furosemide (LASIX) 20 MG tablet Take 20mg  on day 1 and 2 then every other day. 09/13/20   Orma Render, NP  gabapentin (NEURONTIN) 300 MG capsule 1  qam   4   qhs Patient taking differently: Take 300-1,200 mg by mouth See admin instructions. Take 300 mg by mouth in the morning and afternoon and 1200 mg at night  11/22/19   Plovsky, Berneta Sages, MD  GLUCOSAMINE-CHONDROITIN PO Take 2 tablets by mouth every evening.     [provider]  guaiFENesin-dextromethorphan (ROBITUSSIN DM) 100-10 MG/5ML syrup Take 10 mLs by mouth every 4 (four) hours as needed for cough. 08/28/20   [name], DO  nitrofurantoin, macrocrystal-monohydrate, (MACROBID) 100 MG capsule Take 1 capsule (100 mg total) by mouth 2 (two) times daily. 10/04/20   [name], NP  Omega-3 Fatty Acids (FISH OIL) 1200 MG CAPS Take 1,200 mg by mouth every evening.     [provider]  omeprazole (PRILOSEC) 20 MG capsule Take 1 capsule (20 mg total) by mouth daily. Patient taking differently: Take 20 mg by mouth every evening.  05/30/20   [name], DO  ondansetron (ZOFRAN-ODT) 4 MG disintegrating tablet Take 1 tablet (4 mg total) by mouth every 8 (eight) hours as needed for nausea or vomiting. 06/03/20   Noe Gens, PA-C  phentermine (ADIPEX-P) 37.5 MG tablet Take 75 mg by mouth daily.     [provider]  predniSONE (DELTASONE) 10 MG tablet 4 tabs for 2 days 3 tabs for 2 days 2 tabs for 2 days 1 tab for 2 days and continue your current therapy 08/21/20   Barb Merino, MD  SAXENDA 18 MG/3ML SOPN Inject 3 mg into the skin daily.  02/28/19   [provider]  simvastatin (ZOCOR) 20 MG tablet TAKE 1 TABLET (20 MG TOTAL) BY MOUTH DAILY. 09/19/20   [name], DO  tamsulosin (FLOMAX) 0.4 MG CAPS capsule Take 0.4 mg by mouth at bedtime. 08/07/20   [provider]  tolterodine (DETROL LA) 4 MG 24 hr capsule Take 4 mg by mouth daily. 07/12/20   [provider]  vitamin B-12 (CYANOCOBALAMIN) 1000 MCG tablet Take 2,000 mcg by mouth every evening.     [provider]  Vitamin E 180 MG (400 UNIT) CAPS Take 400 Units by mouth every evening.     [provider]    Allergies    Metformin and related, Celebrex [celecoxib], and Penicillins  Review of Systems   Review of Systems   Unable to perform ROS: Mental status change    Physical Exam Updated Vital Signs BP (!) 142/106 (BP Location: Right Arm)  Pulse 94   Temp (!) 103.3 F (39.6 C) (Oral)   Resp 14   SpO2 100%   Physical Exam Vitals and nursing note reviewed.  Constitutional:      General: She is not in acute distress.    Appearance: She is well-developed. She is not diaphoretic.  HENT:     Head: Normocephalic and atraumatic.  Eyes:     General: No scleral icterus.       Right eye: No discharge.        Left eye: No discharge.     Conjunctiva/sclera: Conjunctivae normal.  Cardiovascular:     Rate and Rhythm: Normal rate and regular rhythm.     Pulses: Normal pulses.     Heart sounds: Normal heart sounds.  Pulmonary:     Effort: Pulmonary effort is normal. No respiratory distress.     Breath sounds: Normal breath sounds. No stridor.     Comments: On 2.5 lpm baseline, frequently yawns.  Abdominal:     General: There is no distension.     Tenderness: There is no abdominal tenderness. There is no guarding.  Musculoskeletal:        General: No deformity.     Cervical back: Normal range of motion and neck supple.     Right lower leg: Edema present.     Left lower leg: Edema present.  Skin:    General: Skin is warm and dry.     Findings: Rash (Bilateral lower extremities, papular) present.  Neurological:     Mental Status: She is alert.     Motor: No abnormal muscle tone.     Comments: Patient is awake.  She is able to tell me her first name however is otherwise not oriented to place or time.  Spontaneous movement of all 4 extremities and follows commands.  No facial droop.  She frequently answers 1 word answers.   Psychiatric:     Comments: Picking at blankets, cooperative     ED Results / Procedures / Treatments   Labs (all labs ordered are listed, but only abnormal results are displayed) Labs Reviewed  COMPREHENSIVE METABOLIC PANEL - Abnormal; Notable for the following components:       Result Value   Sodium 123 (*)    Chloride 88 (*)    Glucose, Bld 106 (*)    Creatinine, Ser 1.32 (*)    Calcium 8.8 (*)    Total Protein 10.6 (*)    Albumin 3.3 (*)    GFR, Estimated 45 (*)    All other components within normal limits  CBC - Abnormal; Notable for the following components:   WBC 18.9 (*)    RBC 3.82 (*)    Hemoglobin 10.9 (*)    HCT 34.5 (*)    RDW 15.7 (*)    Platelets 461 (*)    All other components within normal limits  CBG MONITORING, ED - Abnormal; Notable for the following components:   Glucose-Capillary 105 (*)    All other components within normal limits  RESPIRATORY PANEL BY RT PCR (FLU A&B, COVID)  CULTURE, BLOOD (ROUTINE X 2)  CULTURE, BLOOD (ROUTINE X 2)  URINE CULTURE  ETHANOL  AMMONIA  LACTIC ACID, PLASMA  LACTIC ACID, PLASMA  PROTIME-INR  APTT  URINALYSIS, ROUTINE W REFLEX MICROSCOPIC  LIPASE, BLOOD  RAPID URINE DRUG SCREEN, HOSP PERFORMED    EKG EKG Interpretation  Date/Time:  Tuesday October 22 2020 16:03:46 EST Ventricular Rate:  97 PR Interval:  QRS Duration: 81 QT Interval:  326 QTC Calculation: 415 R Axis:   48 Text Interpretation: Sinus rhythm Consider right atrial enlargement Abnormal R-wave progression, early transition Borderline T abnormalities, anterior leads No significant change since last tracing Confirmed by Lacretia Leigh ([id number]) on 10/22/2020 4:55:13 PM   Radiology CT Head Wo Contrast  Result Date: 10/22/2020 CLINICAL DATA:  Delirium, sepsis EXAM: CT HEAD WITHOUT CONTRAST TECHNIQUE: Contiguous axial images were obtained from the base of the skull through the vertex without intravenous contrast. COMPARISON:  None. FINDINGS: Brain: No acute infarct or hemorrhage. Lateral ventricles and midline structures are unremarkable. There are no acute extra-axial fluid collections. There is no mass effect. Vascular: No hyperdense vessel or unexpected calcification. Skull: Normal. Negative for fracture or focal lesion.  Sinuses/Orbits: No acute finding. Other: None. IMPRESSION: 1. No acute intracranial process. Electronically Signed   By: Randa Ngo M.D.   On: 10/22/2020 18:24   DG Chest Port 1 View  Result Date: 10/22/2020 CLINICAL DATA:  Sepsis, confusion, fever EXAM: PORTABLE CHEST 1 VIEW COMPARISON:  08/29/2020 FINDINGS: Single frontal view of the chest demonstrates an unremarkable cardiac silhouette. Diffuse interstitial prominence is again seen throughout the lungs, with near complete resolution of the bilateral airspace disease seen previously. There is minimal residual ground-glass opacity at the right lung base. No effusion or pneumothorax. No acute bony abnormalities. IMPRESSION: 1. Marked improvement in the bilateral airspace disease seen previously, compatible with improved pneumonia. 2. Diffuse interstitial prominence persists, likely postinflammatory scarring. Electronically Signed   By: Randa Ngo M.D.   On: 10/22/2020 16:57   CT Renal Stone Study  Result Date: 10/22/2020 CLINICAL DATA:  64 year old female with sepsis. Bilateral ureteral stents. Suspicion of urothelial neoplasm on September CT without and with contrast. EXAM: CT ABDOMEN AND PELVIS WITHOUT CONTRAST TECHNIQUE: Multidetector CT imaging of the abdomen and pelvis was performed following the standard protocol without IV contrast. COMPARISON:  [facility] CT Abdomen and Pelvis 09/09/2020 and earlier. FINDINGS: Lower chest: Small layering left pleural effusion is new since September. And there are increased left paraspinal and/or retrocrural soft tissue nodules (series 2, image 15) measuring up to 15 mm short axis now, versus 9-10 mm in September. No pericardial effusion. No right pleural effusion. Chronic appearing superimposed bilateral lung base disease with architectural distortion and reticular opacities. No definite acute lung base nodule. Hepatobiliary: Negative noncontrast liver and gallbladder. Pancreas: Negative.  Spleen: Negative.  Stable splenule (normal variant). Adrenals/Urinary Tract: Both adrenal glands are now indistinct. Increased bilateral pararenal space soft tissue thickening and inflammatory stranding. Bilateral double-J ureteral stents remain in place and appear appropriately position. However, bilateral hydronephrosis persists and the right renal collecting system is abnormally hyperdense as before. Stranding continues along the course of both ureters to the bladder. Perivesical stranding a has not significantly changed. No discrete bladder mass. Stomach/Bowel: No dilated large or small bowel loops. There is scattered fluid in the colon. No definite acute bowel inflammation. No free air. No free fluid. Vascular/Lymphatic: Vascular patency is not evaluated in the absence of IV contrast. Normal caliber abdominal aorta. Aortoiliac calcified atherosclerosis. Progressed and now confluent bilateral Peri aortic lymphadenopathy at the level of the kidneys (series 2, image 36) with bilateral lymph node conglomeration is up to 2.5 cm diameter (previously 1.5 cm). Lymphadenopathy tracks caudally in the retroperitoneum to the iliac node stations. Reproductive: Stable, negative noncontrast appearance. Other: Stable presacral stranding since September. No pelvic free fluid. Musculoskeletal: No acute or suspicious osseous lesion identified. IMPRESSION: 1.  Progressed retroperitoneal, left paraspinal, and pelvic lymphadenopathy since September compatible with progressive metastatic nodal disease. 2. Bilateral double-J ureteral stents remain appropriately positioned, however, the bilateral renal collecting systems remain dilated and abnormal as described in September, with underlying Urothelial Neoplasm. Persistent bilateral urinary inflammation. 3. New small layering left pleural effusion.  Chronic lung disease. 4. Aortic Atherosclerosis (ICD10-I70.0). Electronically Signed   By: Genevie Ann M.D.   On: 10/22/2020 18:31     Procedures .Critical Care Performed by: Lorin Glass, PA-C Authorized by: Lorin Glass, PA-C   Critical care provider statement:    Critical care time (minutes):  45   Critical care was time spent personally by me on the following activities:  Discussions with consultants, evaluation of patient's response to treatment, examination of patient, ordering and performing treatments and interventions, ordering and review of laboratory studies, ordering and review of radiographic studies, pulse oximetry, re-evaluation of patient's condition, obtaining history from patient or surrogate and review of old charts   (including critical care time)  Medications Ordered in ED Medications  acetaminophen (TYLENOL) suppository 650 mg (has no administration in time range)  aztreonam (AZACTAM) 2 g in sodium chloride 0.9 % 100 mL IVPB (has no administration in time range)  metroNIDAZOLE (FLAGYL) IVPB 500 mg (has no administration in time range)  vancomycin (VANCOCIN) IVPB 1000 mg/200 mL premix (has no administration in time range)  0.9 %  sodium chloride infusion (has no administration in time range)  sodium chloride 0.9 % bolus 1,000 mL (has no administration in time range)    ED Course  I have reviewed the triage vital signs and the nursing notes.  Pertinent labs & imaging results that were available during my care of the patient were reviewed by me and considered in my medical decision making (see chart for details).  Clinical Course as of Oct 23 2315  Tue Oct 22, 2020  1629 WBC(!): 18.9 [EH]  1710 Sepsis reevaluation was completed.  Patient's blood pressure is maintaining.  She remains slightly tachypneic.  She is just now getting her fluids and Tylenol.  Labs show hyponatremia with a sodium of 123.  White count and platelets are both elevated.   [EH]  3875 Concern for infection.  Culture already ordered and Antibiotics started.   Bacteria, UA(!): RARE [EH]  1909 I spoke with  Dr. Louis Meckel of urology, he will see the patient in consult.    [EH]  1937 Patient reevaluated, she is now alert and oriented to person, place, and time.  She is able to have normal discussions and answer questions appropriately.  Altered mental status appears to have resolved.     [EH]    Clinical Course User Index [EH] Ollen Gross   MDM Rules/Calculators/A&P                         Patient is a 63 year old woman who presents today for evaluation of altered mental status.  According to her husband she has been making bizarre statements, last normal was yesterday morning.  On my exam she is awake, she is oriented to person not to place or time.  She is able to follow commands.  She has known bilateral ureteral stents and had urine at outside facility today showing concern for UTI.  With concern for infection, her being febrile, heart rate over 90, tachypneic with leukocytosis code sepsis is called, she is started on broad-spectrum antibiotics.  Additionally labs show mild anemia  with a hemoglobin of 10.9.  CMP is significant for hyponatremia with a sodium of 123.  Her creatinine is slightly elevated at 1.32.  Ethanol is undetected.  Chest x-ray obtained without acute consolidation or other abnormality.  Blood and urine cultures are sent.  She did not require 30 mL/kg fluid bolus as her lactic was not over 4 and she was not hypotensive.  Her fever was treated with Tylenol.    CT head was obtained due to altered mental status without acute abnormality.  CT renal study obtained shows concern for possible progression of metastatic disease, along with possible pleural effusion.  Patient's mental status improved significantly after fluids and Tylenol, she became ANO x4 and was able to answer questions and converse appropriately.  I talked with Dr. Louis Meckel of urology due to her having stents in place and concerned for urosepsis.  He does not recommend stent removal currently, will continue to  monitor. I spoke with Dr. Olevia Bowens who will see the patient for admission.  While in the emergency room patient was noted to have diarrhea.  Patient works as a Warden/ranger, C. difficile testing is ordered.  Note: Portions of this report may have been transcribed using voice recognition software. Every effort was made to ensure accuracy; however, inadvertent computerized transcription errors may be present  Final Clinical Impression(s) / ED Diagnoses Final diagnoses:  Sepsis with encephalopathy without septic shock, due to unspecified organism [facility])  Delirium  Fever, unspecified    Rx / DC Orders ED Discharge Orders    None       Ollen Gross 10/22/20 2321    Lacretia Leigh, MD 10/24/20 1549

## 2020-10-22 NOTE — ED Triage Notes (Addendum)
Pt states that she has been confused recently. Pt is A&O x2. Pt states that she recently had a stent placed. Pt also endorses right knee pain. Pt normally wears 2.5 L of O2. Pt is poor historian due to AMS. Attempting to have husband come back to triage to obtain more information.  Per husband pt had a stent placed over a month ago. Pt began acting disoriented last night and was complaining of bilateral leg pain.

## 2020-10-22 NOTE — ED Notes (Signed)
Unable to obtain urine sample. Escorted patient to bathroom had her wait so I could get a hat for her to urinate in I was gone for 30 seconds came back and she was sitting on the toilet with the urine cup in her hand having a bowel movement and

## 2020-10-22 NOTE — ED Provider Notes (Signed)
[name]    CSN: [id number] Arrival date & time: 10/22/20  1210      History   Chief Complaint Chief Complaint  Patient presents with  . Altered Mental Status    HPI Mary Stark is a 64 y.o. female.   Patient's husband reports that she developed diarrhea, fever and confusion last night, exhibiting irrational behavior and making nonsensical statements.  She complains of persistent dysuria, headache, abdominal pain and arthralgias.  Her fever has persisted today. Review of chart records reveals that patient has multiple medical problems including history of renal insufficiency, retroperitoneal lymphadenopathy, and bilateral ureteral stents.     Past Medical History:  Diagnosis Date  . Alcohol dependence ([place])   . Anxiety   . Arthritis    Back and lt knee  . Cancer ([place])    skin on nose  . Depression 07/17/2013  . GERD (gastroesophageal reflux disease)   . History of hepatitis C    TX FOR 2012  . History of kidney stones   . History of rheumatic fever AS CHILD   NO PROBLEMS FROM  . Hyperlipidemia 07/17/2013  . Hypertension   . Seasonal allergies   . Toe fracture, right 10/07/2016  . Vitamin D deficiency 07/17/2013    Patient Active Problem List   Diagnosis Date Noted  . Hydronephrosis 10/01/2020  . Proteinuria 10/01/2020  . Abnormal CT of the abdomen 10/01/2020  . Bilateral lower extremity edema 09/12/2020  . Acute hypoxemic respiratory failure due to COVID-19 ([place]) 08/19/2020  . Hyponatremia 08/19/2020  . Tear of medial meniscus of left knee, current 07/13/2019  . Encounter for monitoring NSAID therapy 07/13/2019  . Internal derangement of knee involving posterior horn of lateral meniscus, left 07/13/2019  . Primary osteoarthritis of left knee 07/13/2019  . Renal insufficiency 07/13/2019  . Anxiety about health 03/22/2019  . Hypokalemia 03/21/2019  . Chronic pain of left knee 02/13/2019  . Essential hypertension 02/13/2019  . Liver cyst  04/14/2018  . Anemia due to blood loss, acute 04/14/2018  . Hypomagnesemia 04/14/2018  . Hypocalcemia 04/14/2018  . Tobacco abuse 04/08/2018  . Depression 04/07/2018  . AKI (acute kidney injury) ([place]) 04/07/2018  . Sepsis ([place]) 04/07/2018  . Acute colitis 04/07/2018  . Avascular necrosis of humeral head, right ([place]) 03/30/2018  . NSAID long-term use 03/30/2018  . Encounter for weight loss counseling 03/30/2018  . Seborrheic keratoses 03/15/2018  . History of nonmelanoma skin cancer 03/02/2018  . Skin lesion of chest wall 03/02/2018  . Alcohol dependence with unspecified alcohol-induced disorder ([place]) 02/11/2018  . Severe episode of recurrent major depressive disorder, without psychotic features ([place]) 02/11/2018  . Transaminitis 01/06/2018  . Nicotine dependence 01/06/2018  . Heavy alcohol consumption 01/06/2018  . Encounter for monitoring statin therapy 01/06/2018  . Class 1 obesity due to excess calories with serious comorbidity in adult 01/06/2018  . Chronic pain syndrome 04/21/2017  . Chronic right shoulder pain 07/03/2015  . Vaginismus 04/12/2014  . Menopausal state 11/08/2013  . Family history of ovarian cancer 09/27/2013  . 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 Personal history of colonic polyps 07/18/2013  . Dyslipidemia, goal LDL below 100 07/17/2013  . Depression with anxiety 07/17/2013  . Hx of hepatitis C 07/17/2013  . Vitamin D deficiency 07/17/2013  . Right lumbar radiculopathy 07/17/2013  . History of alcohol dependence ([place]) 07/17/2013  . H/O: rheumatic fever 06/30/2012  . Insomnia 06/30/2012    Past Surgical History:  Procedure Laterality Date  . ANAL  RECTAL MANOMETRY N/A 01/31/2020   Procedure: ANO RECTAL MANOMETRY;  Surgeon: Arta Silence, MD;  Location: [facility];  Service: Endoscopy;  Laterality: N/A;  . [place]  . CYSTOSCOPY WITH RETROGRADE PYELOGRAM, URETEROSCOPY AND STENT PLACEMENT Bilateral 06/11/2020   Procedure:  CYSTOSCOPY WITH RETROGRADE PYELOGRAM, URETEROSCOPY AND STENT PLACEMENT, RIGHT URETERAL BIOPSY;  Surgeon: Robley Fries, MD;  Location: [facility];  Service: Urology;  Laterality: Bilateral;  90 MINS  . CYSTOSCOPY WITH RETROGRADE PYELOGRAM, URETEROSCOPY AND STENT PLACEMENT Bilateral 07/05/2020   Procedure: CYSTOSCOPY WITH RETROGRADE PYELOGRAM, URETEROSCOPY,  BIOPSIES AND STENT EXCHANGES;  Surgeon: Robley Fries, MD;  Location: [facility];  Service: Urology;  Laterality: Bilateral;  . FINGER SURGERY Right    5th  . GYNECOLOGIC CRYOSURGERY  YRS AGO  . TONSILLECTOMY  AS CHILD    OB History   No obstetric history on file.      Home Medications    Prior to Admission medications   Medication Sig Start Date End Date Taking? Authorizing Provider  BYSTOLIC 20 MG TABS TAKE 1 TABLET (20 MG TOTAL) BY MOUTH DAILY. 10/17/20   [name], DO  acetaminophen (TYLENOL) 500 MG tablet Take 1,000 mg by mouth every 8 (eight) hours as needed for moderate pain.     [provider]  albuterol (PROAIR HFA) 108 (90 Base) MCG/ACT inhaler INHALE 1 TO 2 PUFFS BY MOUTH INTO THE LUNGS EVERY 4 HOURS AS NEEDED FOR WHEEZING OR SHORTNESS OF BREATH 08/28/20   [name], DO  amLODipine (NORVASC) 5 MG tablet TAKE 1 TABLET BY MOUTH ONCE DAILY Patient taking differently: Take 5 mg by mouth every evening.  06/13/20   [name], DO  ARIPiprazole (ABILIFY) 5 MG tablet Take 5 mg by mouth at bedtime.     [provider]  ascorbic acid (VITAMIN C) 500 MG tablet Take 1,000 mg by mouth every evening.     [provider]  ASPIRIN LOW DOSE 81 MG EC tablet TAKE 1 TABLET BY MOUTH DAILY. Patient taking differently: Take 81 mg by mouth daily.  04/09/20   [name], DO  Benzphetamine HCl 50 MG TABS Take 50 mg by mouth daily.    [provider]  buPROPion (WELLBUTRIN XL) 300 MG 24 hr tablet Take 1 tablet (300 mg total) by mouth daily. 11/22/19   Plovsky, Berneta Sages,  MD  Cholecalciferol (VITAMIN D) 125 MCG (5000 UT) CAPS Take 5,000 Units by mouth every evening.     [provider]  clonazePAM (KLONOPIN) 0.5 MG tablet Take 0.5 mg by mouth at bedtime.  04/15/20   [provider]  Coenzyme Q10 (CO Q-10) 200 MG CAPS Take 200 mg by mouth every evening.     [provider]  DULoxetine (CYMBALTA) 30 MG capsule Take 1 capsule by mouth daily. 09/17/20   [provider]  fexofenadine (ALLEGRA) 180 MG tablet Take 180 mg by mouth every evening.     [provider]  furosemide (LASIX) 20 MG tablet Take 20mg  on day 1 and 2 then every other day. 09/13/20   Orma Render, NP  gabapentin (NEURONTIN) 300 MG capsule 1  qam   4   qhs Patient taking differently: Take 300-1,200 mg by mouth See admin instructions. Take 300 mg by mouth in the morning and afternoon and 1200 mg at night 11/22/19   Plovsky, Berneta Sages, MD  GLUCOSAMINE-CHONDROITIN PO Take 2 tablets by mouth every evening.     [provider]  guaiFENesin-dextromethorphan (  ROBITUSSIN DM) 100-10 MG/5ML syrup Take 10 mLs by mouth every 4 (four) hours as needed for cough. 08/28/20   [name], DO  nitrofurantoin, macrocrystal-monohydrate, (MACROBID) 100 MG capsule Take 1 capsule (100 mg total) by mouth 2 (two) times daily. 10/04/20   [name], NP  Omega-3 Fatty Acids (FISH OIL) 1200 MG CAPS Take 1,200 mg by mouth every evening.     [provider]  omeprazole (PRILOSEC) 20 MG capsule Take 1 capsule (20 mg total) by mouth daily. Patient taking differently: Take 20 mg by mouth every evening.  05/30/20   [name], DO  ondansetron (ZOFRAN-ODT) 4 MG disintegrating tablet Take 1 tablet (4 mg total) by mouth every 8 (eight) hours as needed for nausea or vomiting. 06/03/20   Noe Gens, PA-C  phentermine (ADIPEX-P) 37.5 MG tablet Take 75 mg by mouth daily.     [provider]  predniSONE (DELTASONE) 10 MG tablet 4 tabs for 2 days 3 tabs for 2 days 2  tabs for 2 days 1 tab for 2 days and continue your current therapy 08/21/20   Barb Merino, MD  SAXENDA 18 MG/3ML SOPN Inject 3 mg into the skin daily.  02/28/19   [provider]  simvastatin (ZOCOR) 20 MG tablet TAKE 1 TABLET (20 MG TOTAL) BY MOUTH DAILY. 09/19/20   [name], DO  tamsulosin (FLOMAX) 0.4 MG CAPS capsule Take 0.4 mg by mouth at bedtime. 08/07/20   [provider]  tolterodine (DETROL LA) 4 MG 24 hr capsule Take 4 mg by mouth daily. 07/12/20   [provider]  vitamin B-12 (CYANOCOBALAMIN) 1000 MCG tablet Take 2,000 mcg by mouth every evening.     [provider]  Vitamin E 180 MG (400 UNIT) CAPS Take 400 Units by mouth every evening.     [provider]    Family History Family History  Problem Relation Age of Onset  . Ovarian cancer Mother   . Cardiomyopathy Father   . Valvular heart disease Father   . Liver cancer Brother   . Protein C deficiency Brother   . Protein C deficiency Brother   . Stroke Brother     Social History Social History   Tobacco Use  . Smoking status: Former Smoker    Packs/day: 1.00    Years: 25.00    Pack years: 25.00    Types: Cigarettes    Quit date: 2012    Years since quitting: 9.8  . Smokeless tobacco: Never Used  Vaping Use  . Vaping Use: Every day  . Substances: Nicotine  Substance Use Topics  . Alcohol use: Not Currently    Alcohol/week: 24.0 - 36.0 standard drinks    Types: 24 - 36 Cans of beer per week    Comment: Pt reports that she stopped march 2021  . Drug use: No     Allergies   Metformin and related, Celebrex [celecoxib], and Penicillins   Review of Systems Review of Systems  Constitutional: Positive for activity change, appetite change, chills, diaphoresis, fatigue and fever.  HENT: Negative.   Eyes: Negative.   Respiratory: Negative.   Cardiovascular: Negative.   Gastrointestinal: Positive for diarrhea. Negative for abdominal pain.  Genitourinary:  Negative.   Musculoskeletal: Negative.   Skin: Negative.   Neurological: Negative.   Psychiatric/Behavioral: Positive for confusion and decreased concentration. The patient is nervous/anxious.      Physical Exam Triage Vital Signs ED Triage Vitals  Enc Vitals Group  BP 10/22/20 1224 126/83     Pulse Rate 10/22/20 1224 94     Resp --      Temp 10/22/20 1224 99.7 F (37.6 C)     Temp Source 10/22/20 1224 Oral     SpO2 10/22/20 1224 99 %     Weight 10/22/20 1226 199 lb 15.3 oz (90.7 kg)     Height 10/22/20 1226 5\' 6"  (1.676 m)     Head Circumference --      Peak Flow --      Pain Score --      Pain Loc --      Pain Edu? --      Excl. in [place]? --    No data found.  Updated Vital Signs BP 126/83 (BP Location: Right Arm)   Pulse 94   Temp 99.7 F (37.6 C) (Oral)   Ht 5\' 6"  (1.676 m)   Wt 90.7 kg   SpO2 99%   BMI 32.27 kg/m   Visual Acuity Right Eye Distance:   Left Eye Distance:   Bilateral Distance:    Right Eye Near:   Left Eye Near:    Bilateral Near:     Physical Exam Vitals (Brief exam only) and nursing note reviewed.  Constitutional:      General: She is not in acute distress.    Appearance: She is ill-appearing.  HENT:     Head: Normocephalic.  Eyes:     Pupils: Pupils are equal, round, and reactive to light.  Cardiovascular:     Rate and Rhythm: Normal rate.  Pulmonary:     Effort: Pulmonary effort is normal.  Skin:    General: Skin is warm and dry.  Neurological:     Mental Status: She is alert. She is disoriented.  Psychiatric:        Mood and Affect: Affect is flat.        Cognition and Memory: Cognition is impaired. Memory is impaired.      UC Treatments / Results  Labs (all labs ordered are listed, but only abnormal results are displayed) Labs Reviewed  POCT URINALYSIS DIP (MANUAL ENTRY) - Abnormal; Notable for the following components:      Result Value   Color, UA other (*)    Clarity, UA cloudy (*)    Blood, UA large (*)     Protein Ur, POC >=300 (*)    Nitrite, UA Positive (*)    Leukocytes, UA Small (1+) (*)    All other components within normal limits    EKG   Radiology No results found.  Procedures Procedures (including critical care time)  Medications Ordered in UC Medications - No data to display  Initial Impression / Assessment and Plan / UC Course  I have reviewed the triage vital signs and the nursing notes.  Pertinent labs & imaging results that were available during my care of the patient were reviewed by me and considered in my medical decision making (see chart for details).    Concern for possible urosepsis in this patient with multiple morbidities.  Her vital signs are stable.  Advised patient's husband to proceed immediately to [facility] ED for evaluation.   Final Clinical Impressions(s) / UC Diagnoses   Final diagnoses:  Nocturia  Acute confusional state  Fever, unspecified   Discharge Instructions   None    ED Prescriptions    None        Kandra Nicolas, MD 10/26/20 [phone]

## 2020-10-23 DIAGNOSIS — N131 Hydronephrosis with ureteral stricture, not elsewhere classified: Secondary | ICD-10-CM

## 2020-10-23 DIAGNOSIS — I10 Essential (primary) hypertension: Secondary | ICD-10-CM

## 2020-10-23 DIAGNOSIS — Z720 Tobacco use: Secondary | ICD-10-CM

## 2020-10-23 DIAGNOSIS — E871 Hypo-osmolality and hyponatremia: Secondary | ICD-10-CM

## 2020-10-23 DIAGNOSIS — A419 Sepsis, unspecified organism: Secondary | ICD-10-CM | POA: Diagnosis not present

## 2020-10-23 DIAGNOSIS — E785 Hyperlipidemia, unspecified: Secondary | ICD-10-CM | POA: Diagnosis not present

## 2020-10-23 DIAGNOSIS — F1021 Alcohol dependence, in remission: Secondary | ICD-10-CM

## 2020-10-23 DIAGNOSIS — F418 Other specified anxiety disorders: Secondary | ICD-10-CM

## 2020-10-23 DIAGNOSIS — D649 Anemia, unspecified: Secondary | ICD-10-CM

## 2020-10-23 LAB — COMPREHENSIVE METABOLIC PANEL
ALT: 10 U/L (ref 0–44)
AST: 19 U/L (ref 15–41)
Albumin: 2.6 g/dL — ABNORMAL LOW (ref 3.5–5.0)
Alkaline Phosphatase: 50 U/L (ref 38–126)
Anion gap: 8 (ref 5–15)
BUN: 17 mg/dL (ref 8–23)
CO2: 19 mmol/L — ABNORMAL LOW (ref 22–32)
Calcium: 7.2 mg/dL — ABNORMAL LOW (ref 8.9–10.3)
Chloride: 99 mmol/L (ref 98–111)
Creatinine, Ser: 1.1 mg/dL — ABNORMAL HIGH (ref 0.44–1.00)
GFR, Estimated: 56 mL/min — ABNORMAL LOW (ref >60)
Glucose, Bld: 99 mg/dL (ref 70–99)
Potassium: 3 mmol/L — ABNORMAL LOW (ref 3.5–5.1)
Sodium: 126 mmol/L — ABNORMAL LOW (ref 135–145)
Total Bilirubin: 0.6 mg/dL (ref 0.3–1.2)
Total Protein: 7.9 g/dL (ref 6.5–8.1)

## 2020-10-23 LAB — CBC WITH DIFFERENTIAL/PLATELET
Abs Immature Granulocytes: 0.11 10*3/uL — ABNORMAL HIGH (ref 0.00–0.07)
Basophils Absolute: 0.1 10*3/uL (ref 0.0–0.1)
Basophils Relative: 0 %
Eosinophils Absolute: 0 10*3/uL (ref 0.0–0.5)
Eosinophils Relative: 0 %
HCT: 27.7 % — ABNORMAL LOW (ref 36.0–46.0)
Hemoglobin: 8.6 g/dL — ABNORMAL LOW (ref 12.0–15.0)
Immature Granulocytes: 1 %
Lymphocytes Relative: 18 %
Lymphs Abs: 3 10*3/uL (ref 0.7–4.0)
MCH: 28.5 pg (ref 26.0–34.0)
MCHC: 31 g/dL (ref 30.0–36.0)
MCV: 91.7 fL (ref 80.0–100.0)
Monocytes Absolute: 2.8 10*3/uL — ABNORMAL HIGH (ref 0.1–1.0)
Monocytes Relative: 17 %
Neutro Abs: 10.9 10*3/uL — ABNORMAL HIGH (ref 1.7–7.7)
Neutrophils Relative %: 64 %
Platelets: 317 10*3/uL (ref 150–400)
RBC: 3.02 MIL/uL — ABNORMAL LOW (ref 3.87–5.11)
RDW: 15.7 % — ABNORMAL HIGH (ref 11.5–15.5)
WBC: 16.8 10*3/uL — ABNORMAL HIGH (ref 4.0–10.5)
nRBC: 0 % (ref 0.0–0.2)

## 2020-10-23 MED ORDER — AMLODIPINE BESYLATE 5 MG PO TABS
5.0000 mg | ORAL_TABLET | Freq: Every evening | ORAL | Status: DC
  Administered 2020-10-23 – 2020-10-24 (×2): 5 mg via ORAL
  Filled 2020-10-23 (×2): qty 1

## 2020-10-23 MED ORDER — ADULT MULTIVITAMIN W/MINERALS CH
1.0000 | ORAL_TABLET | Freq: Every day | ORAL | Status: DC
  Administered 2020-10-24 – 2020-10-25 (×2): 1 via ORAL
  Filled 2020-10-23 (×2): qty 1

## 2020-10-23 MED ORDER — ENSURE ENLIVE PO LIQD
237.0000 mL | Freq: Two times a day (BID) | ORAL | Status: DC
  Administered 2020-10-23 – 2020-10-25 (×3): 237 mL via ORAL

## 2020-10-23 MED ORDER — POTASSIUM CHLORIDE IN NACL 20-0.9 MEQ/L-% IV SOLN
INTRAVENOUS | Status: DC
  Filled 2020-10-23 (×5): qty 1000

## 2020-10-23 MED ORDER — TAMSULOSIN HCL 0.4 MG PO CAPS
0.4000 mg | ORAL_CAPSULE | Freq: Every day | ORAL | Status: DC
  Administered 2020-10-23 – 2020-10-24 (×2): 0.4 mg via ORAL
  Filled 2020-10-23 (×2): qty 1

## 2020-10-23 MED ORDER — PANTOPRAZOLE SODIUM 40 MG PO TBEC
40.0000 mg | DELAYED_RELEASE_TABLET | Freq: Every day | ORAL | Status: DC
  Administered 2020-10-23 – 2020-10-25 (×3): 40 mg via ORAL
  Filled 2020-10-23 (×3): qty 1

## 2020-10-23 MED ORDER — SIMVASTATIN 20 MG PO TABS
20.0000 mg | ORAL_TABLET | Freq: Every day | ORAL | Status: DC
  Administered 2020-10-23 – 2020-10-24 (×2): 20 mg via ORAL
  Filled 2020-10-23 (×2): qty 1

## 2020-10-23 NOTE — Progress Notes (Signed)
PROGRESS NOTE  Mary Stark  CWC:[id number] DOB: 09/18/1956 DOA: 10/22/2020 PCP: [name], DO   Brief Narrative: Mary Stark is a 64 y.o. female with a history including HTN, HLD, hepatitis C s/p treatment 2013, breakthrough covid-19 (despite [place] completed Jan 2021) requiring hospitalization September 2021 with continued home oxygen requirement, hydronephrosis s/p bilateral ureteral stents July 2021 and retroperitoneal lymphadenopathy who presented to the ED 11/9 with confusion and diarrhea. She was found to have pyuria and CT abdomen demonstrating worsening lymphadenopathy and left perinephric stranding consistent with left pyelonephritis. Urology was consulted, confirmed stents were functioning as expected in good place, and recommended no procedures at this time. Mental status remains altered.  Assessment & Plan: Principal Problem:   Sepsis secondary to UTI [facility]) Active Problems:   Dyslipidemia, goal LDL below 100   Depression with anxiety   History of alcohol dependence (HCC)   Tobacco abuse   Essential hypertension   Hyponatremia   Hydronephrosis   Normocytic anemia   Hyperproteinemia  Sepsis due to left pyelonephritis: Leukocytosis continues.  - Continue broad antibiotics with cefepime (tolerating this despite previously reported PCN allergy), flagyl pending further data including urine culture, blood cultures  Diarrhea:  - Enteric precautions pending stool studies (pending).  - Prefer to hold antidiarrheals until CDiff confirmed negative.  - Consider GI evaluation as inpatient vs. outpatient.  Retroperitoneal lymphadenopathy: D/w urology who saw no masses on repeat direct kidney visualization.  - Continue with scheduled biopsy per IR on 11/12, will likely remain inpatient.  Acute metabolic encephalopathy, depression, anxiety: UDS negative. EtOH level negative. CT head without acute process. - Continue home medications to avoid withdrawal including abilify,  clonazepam - Delirium precautions. Encourage husband to remain at bedside.   Hydronephrosis: Stable.  - Urology follow up as outpatient.  HTN:  - Continue norvasc. Hold diuretic with GI losses.   Hypokalemia:  - Supplement in IVF, check in AM w/Mg.     Hyponatremia: GI losses likely culprit, though chronic component is possible with history of same.  - Isotonic IVF to continue. Na improving at goal rate.  History of alcohol dependence: No current evidence of withdrawal.  - Monitor for withdrawal signs/symptoms.   Tobacco use:  - Cessation counseling once closer to discharge  DVT prophylaxis: Lovenox Code Status: Full Family Communication: Husband at bedside Disposition Plan:  Status is: Inpatient  Remains inpatient appropriate because:Persistent severe electrolyte disturbances, Altered mental status, Ongoing diagnostic testing needed not appropriate for outpatient work up and IV treatments appropriate due to intensity of illness or inability to take PO  Dispo: The patient is from: Home              Anticipated d/c is to: TBD              Anticipated d/c date is: 3 days              Patient currently is not medically stable to d/c.  Consultants:   Urology  IR  Procedures:   None  Subjective: Mentation more clear than prior to arrival per pt and husband, though she's still spacey and unsteady. Required mittens for delirium last night.   Objective: Vitals:   10/23/20 0419 10/23/20 0549 10/23/20 0948 10/23/20 1359  BP: 126/66 135/67 (!) 142/74 (!) 153/83  Pulse: 90 84 92 85  Resp: 20 20 18 20   Temp: 98.9 F (37.2 C) 98.5 F (36.9 C) 97.7 F (36.5 C) 98.2 F (36.8 C)  TempSrc: Axillary Oral Oral  Oral  SpO2: 97% 99% 100% 99%  Weight:      Height:        Intake/Output Summary (Last 24 hours) at 10/23/2020 1714 Last data filed at 10/23/2020 0600 Gross per 24 hour  Intake 3738.61 ml  Output --  Net 3738.61 ml   Filed Weights   10/22/20 1705 10/22/20  2200  Weight: 86.2 kg 84.7 kg    Gen: 64 y.o. female in no distress Pulm: Non-labored breathing room air. Clear to auscultation bilaterally.  CV: Regular rate and rhythm. No murmur, rub, or gallop. No JVD, no pitting pedal edema. GI: Abdomen soft, Left abdominal tenderness, non-distended, with normoactive bowel sounds. No organomegaly or masses felt. Ext: Warm, no deformities Skin: No rashes, lesions or ulcers Neuro: Alert and oriented. No focal neurological deficits. Psych: Judgement and insight appear normal. Mood & affect appropriate.   Data Reviewed: I have personally reviewed following labs and imaging studies  CBC: Recent Labs  Lab 10/22/20 1440 10/23/20 0449  WBC 18.9* 16.8*  NEUTROABS  --  10.9*  HGB 10.9* 8.6*  HCT 34.5* 27.7*  MCV 90.3 91.7  PLT 461* 416   Basic Metabolic Panel: Recent Labs  Lab 10/22/20 1440 10/23/20 0449  NA 123* 126*  K 4.2 3.0*  CL 88* 99  CO2 23 19*  GLUCOSE 106* 99  BUN 18 17  CREATININE 1.32* 1.10*  CALCIUM 8.8* 7.2*   GFR: Estimated Creatinine Clearance: 57.4 mL/min (A) (by C-G formula based on SCr of 1.1 mg/dL (H)). Liver Function Tests: Recent Labs  Lab 10/22/20 1440 10/23/20 0449  AST 21 19  ALT 11 10  ALKPHOS 70 50  BILITOT 0.5 0.6  PROT 10.6* 7.9  ALBUMIN 3.3* 2.6*   Recent Labs  Lab 10/22/20 1625  LIPASE 22   Recent Labs  Lab 10/22/20 2104  AMMONIA 12   Coagulation Profile: Recent Labs  Lab 10/22/20 1625  INR 1.2   Cardiac Enzymes: No results for input(s): CKTOTAL, CKMB, CKMBINDEX, TROPONINI in the last 168 hours. BNP (last 3 results) No results for input(s): PROBNP in the last 8760 hours. HbA1C: No results for input(s): HGBA1C in the last 72 hours. CBG: Recent Labs  Lab 10/22/20 1607 10/22/20 2314  GLUCAP 105* 105*   Lipid Profile: No results for input(s): CHOL, HDL, LDLCALC, TRIG, CHOLHDL, LDLDIRECT in the last 72 hours. Thyroid Function Tests: No results for input(s): TSH, T4TOTAL,  FREET4, T3FREE, THYROIDAB in the last 72 hours. Anemia Panel: No results for input(s): VITAMINB12, FOLATE, FERRITIN, TIBC, IRON, RETICCTPCT in the last 72 hours. Urine analysis:    Component Value Date/Time   COLORURINE YELLOW 10/22/2020 1657   APPEARANCEUR CLOUDY (A) 10/22/2020 1657   LABSPEC 1.017 10/22/2020 1657   PHURINE 6.0 10/22/2020 1657   GLUCOSEU NEGATIVE 10/22/2020 1657   HGBUR MODERATE (A) 10/22/2020 1657   BILIRUBINUR NEGATIVE 10/22/2020 1657   BILIRUBINUR negative 10/22/2020 1305   KETONESUR 5 (A) 10/22/2020 1657   KETONESUR negative 10/22/2020 1305   PROTEINUR 100 (A) 10/22/2020 1657   PROTEINUR >=300 (A) 10/22/2020 1305   UROBILINOGEN 0.2 10/22/2020 1305   NITRITE NEGATIVE 10/22/2020 1657   NITRITE Positive (A) 10/22/2020 1305   LEUKOCYTESUR LARGE (A) 10/22/2020 1657   LEUKOCYTESUR Small (1+) (A) 10/22/2020 1305   Recent Results (from the past 240 hour(s))  Blood Culture (routine x 2)     Status: None (Preliminary result)   Collection Time: 10/22/20  4:25 PM   Specimen: BLOOD LEFT FOREARM  Result Value Ref  Range Status   Specimen Description   Final    BLOOD LEFT FOREARM Performed at [facility] [address] [place], [place] [address]    Special Requests   Final    BOTTLES DRAWN AEROBIC AND ANAEROBIC Blood Culture adequate volume Performed at [facility] [address] [place], [place] [address]    Culture   Final    NO GROWTH < 12 HOURS Performed at [facility] [address], [place], [place] [address]    Report Status PENDING  Incomplete  Blood Culture (routine x 2)     Status: None (Preliminary result)   Collection Time: 10/22/20  4:25 PM   Specimen: BLOOD LEFT HAND  Result Value Ref Range Status   Specimen Description   Final    BLOOD LEFT HAND Performed at [facility] [address], [place], [place] [address]    Special Requests   Final    BOTTLES DRAWN AEROBIC AND  ANAEROBIC Blood Culture results may not be optimal due to an inadequate volume of blood received in culture bottles Performed at [facility] [address] [place], [place] [address]    Culture   Final    NO GROWTH < 12 HOURS Performed at [facility] [address] [place], [place] [address]    Report Status PENDING  Incomplete  Respiratory Panel by RT PCR (Flu A&B, Covid) - Nasopharyngeal Swab     Status: None   Collection Time: 10/22/20  6:40 PM   Specimen: Nasopharyngeal Swab  Result Value Ref Range Status   SARS Coronavirus 2 by RT PCR NEGATIVE NEGATIVE Final    Comment: (NOTE) SARS-CoV-2 target nucleic acids are NOT DETECTED.  The SARS-CoV-2 RNA is generally detectable in upper respiratoy specimens during the acute phase of infection. The lowest concentration of SARS-CoV-2 viral copies this assay can detect is 131 copies/mL. A negative result does not preclude SARS-Cov-2 infection and should not be used as the sole basis for treatment or other patient management decisions. A negative result may occur with  improper specimen collection/handling, submission of specimen other than nasopharyngeal swab, presence of viral mutation(s) within the areas targeted by this assay, and inadequate number of viral copies (<131 copies/mL). A negative result must be combined with clinical observations, patient history, and epidemiological information. The expected result is Negative.  Fact Sheet for Patients:  PinkCheek.be  Fact Sheet for Healthcare Providers:  GravelBags.it  This test is no t yet approved or cleared by the [place] FDA and  has been authorized for detection and/or diagnosis of SARS-CoV-2 by FDA under an Emergency Use Authorization (EUA). This EUA will remain  in effect (meaning this test can be used) for the duration of the COVID-19 declaration under Section 564(b)(1) of the Act,  21 U.S.C. section 360bbb-3(b)(1), unless the authorization is terminated or revoked sooner.     Influenza A by PCR NEGATIVE NEGATIVE Final   Influenza B by PCR NEGATIVE NEGATIVE Final    Comment: (NOTE) The Xpert Xpress SARS-CoV-2/FLU/RSV assay is intended as an aid in  the diagnosis of influenza from Nasopharyngeal swab specimens and  should not be used as a sole basis for treatment. Nasal washings and  aspirates are unacceptable for Xpert Xpress SARS-CoV-2/FLU/RSV  testing.  Fact Sheet for Patients: PinkCheek.be  Fact Sheet for Healthcare Providers: GravelBags.it  This test is not yet approved or cleared by the [place] FDA and  has been  authorized for detection and/or diagnosis of SARS-CoV-2 by  FDA under an Emergency Use Authorization (EUA). This EUA will remain  in effect (meaning this test can be used) for the duration of the  Covid-19 declaration under Section 564(b)(1) of the Act, 21  U.S.C. section 360bbb-3(b)(1), unless the authorization is  terminated or revoked. Performed at [facility], [place] [address] [place], [place] [address]       Radiology Studies: CT Head Wo Contrast  Result Date: 10/22/2020 CLINICAL DATA:  Delirium, sepsis EXAM: CT HEAD WITHOUT CONTRAST TECHNIQUE: Contiguous axial images were obtained from the base of the skull through the vertex without intravenous contrast. COMPARISON:  None. FINDINGS: Brain: No acute infarct or hemorrhage. Lateral ventricles and midline structures are unremarkable. There are no acute extra-axial fluid collections. There is no mass effect. Vascular: No hyperdense vessel or unexpected calcification. Skull: Normal. Negative for fracture or focal lesion. Sinuses/Orbits: No acute finding. Other: None. IMPRESSION: 1. No acute intracranial process. Electronically Signed   By: Randa Ngo M.D.   On: 10/22/2020 18:24   DG Chest Port 1 View  Result  Date: 10/22/2020 CLINICAL DATA:  Sepsis, confusion, fever EXAM: PORTABLE CHEST 1 VIEW COMPARISON:  08/29/2020 FINDINGS: Single frontal view of the chest demonstrates an unremarkable cardiac silhouette. Diffuse interstitial prominence is again seen throughout the lungs, with near complete resolution of the bilateral airspace disease seen previously. There is minimal residual ground-glass opacity at the right lung base. No effusion or pneumothorax. No acute bony abnormalities. IMPRESSION: 1. Marked improvement in the bilateral airspace disease seen previously, compatible with improved pneumonia. 2. Diffuse interstitial prominence persists, likely postinflammatory scarring. Electronically Signed   By: Randa Ngo M.D.   On: 10/22/2020 16:57   CT Renal Stone Study  Result Date: 10/22/2020 CLINICAL DATA:  64 year old female with sepsis. Bilateral ureteral stents. Suspicion of urothelial neoplasm on September CT without and with contrast. EXAM: CT ABDOMEN AND PELVIS WITHOUT CONTRAST TECHNIQUE: Multidetector CT imaging of the abdomen and pelvis was performed following the standard protocol without IV contrast. COMPARISON:  [facility] CT Abdomen and Pelvis 09/09/2020 and earlier. FINDINGS: Lower chest: Small layering left pleural effusion is new since September. And there are increased left paraspinal and/or retrocrural soft tissue nodules (series 2, image 15) measuring up to 15 mm short axis now, versus 9-10 mm in September. No pericardial effusion. No right pleural effusion. Chronic appearing superimposed bilateral lung base disease with architectural distortion and reticular opacities. No definite acute lung base nodule. Hepatobiliary: Negative noncontrast liver and gallbladder. Pancreas: Negative. Spleen: Negative.  Stable splenule (normal variant). Adrenals/Urinary Tract: Both adrenal glands are now indistinct. Increased bilateral pararenal space soft tissue thickening and inflammatory  stranding. Bilateral double-J ureteral stents remain in place and appear appropriately position. However, bilateral hydronephrosis persists and the right renal collecting system is abnormally hyperdense as before. Stranding continues along the course of both ureters to the bladder. Perivesical stranding a has not significantly changed. No discrete bladder mass. Stomach/Bowel: No dilated large or small bowel loops. There is scattered fluid in the colon. No definite acute bowel inflammation. No free air. No free fluid. Vascular/Lymphatic: Vascular patency is not evaluated in the absence of IV contrast. Normal caliber abdominal aorta. Aortoiliac calcified atherosclerosis. Progressed and now confluent bilateral Peri aortic lymphadenopathy at the level of the kidneys (series 2, image 36) with bilateral lymph node conglomeration is up to 2.5 cm diameter (previously 1.5 cm). Lymphadenopathy tracks caudally in the retroperitoneum to the iliac node  stations. Reproductive: Stable, negative noncontrast appearance. Other: Stable presacral stranding since September. No pelvic free fluid. Musculoskeletal: No acute or suspicious osseous lesion identified. IMPRESSION: 1. Progressed retroperitoneal, left paraspinal, and pelvic lymphadenopathy since September compatible with progressive metastatic nodal disease. 2. Bilateral double-J ureteral stents remain appropriately positioned, however, the bilateral renal collecting systems remain dilated and abnormal as described in September, with underlying Urothelial Neoplasm. Persistent bilateral urinary inflammation. 3. New small layering left pleural effusion.  Chronic lung disease. 4. Aortic Atherosclerosis (ICD10-I70.0). Electronically Signed   By: Genevie Ann M.D.   On: 10/22/2020 18:31    Scheduled Meds: . amLODipine  5 mg Oral QPM  . ARIPiprazole  5 mg Oral QHS  . clonazePAM  0.5 mg Oral QHS  . enoxaparin (LOVENOX) injection  40 mg Subcutaneous Q24H  . pantoprazole  40 mg Oral  Daily  . simvastatin  20 mg Oral q1800  . tamsulosin  0.4 mg Oral QHS   Continuous Infusions: . 0.9 % NaCl with KCl 20 mEq / L 100 mL/hr at 10/23/20 1027  . ceFEPime (MAXIPIME) IV 2 g (10/23/20 1251)     LOS: 1 day   Time spent: 25 minutes.  Patrecia Pour, MD [facility] [email or web address] 10/23/2020, 5:14 PM

## 2020-10-23 NOTE — Progress Notes (Signed)
Husband can spend the night per MD

## 2020-10-23 NOTE — Progress Notes (Signed)
Urology Inpatient Progress Report    Intv/Subj: Patient admitted to hospital with left pyelonephritis.  Stents are in good position.  Patient feels out of it and had a fall last night.  Non-contrast CT shows continued retroperitoneal LAD of unknown reason.    Principal Problem:   Sepsis secondary to UTI [facility]) Active Problems:   Dyslipidemia, goal LDL below 100   Depression with anxiety   History of alcohol dependence (HCC)   Tobacco abuse   Essential hypertension   Hyponatremia   Hydronephrosis   Normocytic anemia   Hyperproteinemia  Current Facility-Administered Medications  Medication Dose Route Frequency Provider Last Rate Last Admin  . 0.9 % NaCl with KCl 20 mEq/ L  infusion   Intravenous Continuous Vance Gather B, MD      . acetaminophen (TYLENOL) tablet 650 mg  650 mg Oral Q6H PRN Reubin Milan, MD   650 mg at 10/23/20 2202   Or  . acetaminophen (TYLENOL) suppository 650 mg  650 mg Rectal Q6H PRN Reubin Milan, MD      . amLODipine [facility]) tablet 5 mg  5 mg Oral QPM Reubin Milan, MD      . ARIPiprazole (ABILIFY) tablet 5 mg  5 mg Oral QHS Reubin Milan, MD   5 mg at 10/23/20 0000  . ceFEPIme (MAXIPIME) 2 g in sodium chloride 0.9 % 100 mL IVPB  2 g Intravenous Q12H Polly Cobia, RPH 200 mL/hr at 10/22/20 2327 2 g at 10/22/20 2327  . clonazePAM (KLONOPIN) tablet 0.5 mg  0.5 mg Oral QHS Reubin Milan, MD   0.5 mg at 10/23/20 0000  . enoxaparin (LOVENOX) injection 40 mg  40 mg Subcutaneous Q24H Reubin Milan, MD   40 mg at 10/22/20 2221  . ondansetron (ZOFRAN) tablet 4 mg  4 mg Oral Q6H PRN Reubin Milan, MD       Or  . ondansetron [facility]) injection 4 mg  4 mg Intravenous Q6H PRN Reubin Milan, MD      . pantoprazole (White) EC tablet 40 mg  40 mg Oral Daily Reubin Milan, MD      . simvastatin [facility]) tablet 20 mg  20 mg Oral q1800 Reubin Milan, MD      . tamsulosin [facility]) capsule 0.4 mg  0.4 mg Oral QHS  Reubin Milan, MD         Objective: Vital: Vitals:   10/23/20 0331 10/23/20 0419 10/23/20 0549 10/23/20 0948  BP: 124/71 126/66 135/67 (!) 142/74  Pulse: 90 90 84 92  Resp: 20 20 20 18   Temp: 100.3 F (37.9 C) 98.9 F (37.2 C) 98.5 F (36.9 C) 97.7 F (36.5 C)  TempSrc: Oral Axillary Oral Oral  SpO2: 97% 97% 99% 100%  Weight:      Height:       I/Os: I/O last 3 completed shifts: In: 3738.6 [P.O.:120; I.V.:182.1; IV Piggyback:3436.5] Out: -   Physical Exam:  General: Patient is in no apparent distress Lungs: Normal respiratory effort, chest expands symmetrically. GI:  The abdomen is soft and nontender GU: No CVA tenderness to palpation Ext: lower extremities symmetric  Lab Results: Recent Labs    10/22/20 1440 10/23/20 0449  WBC 18.9* 16.8*  HGB 10.9* 8.6*  HCT 34.5* 27.7*   Recent Labs    10/22/20 1440 10/23/20 0449  NA 123* 126*  K 4.2 3.0*  CL 88* 99  CO2 23 19*  GLUCOSE 106* 99  BUN  18 17  CREATININE 1.32* 1.10*  CALCIUM 8.8* 7.2*   Recent Labs    10/22/20 1625  INR 1.2   No results for input(s): LABURIN in the last 72 hours. Results for orders placed or performed during the hospital encounter of 10/22/20  Blood Culture (routine x 2)     Status: None (Preliminary result)   Collection Time: 10/22/20  4:25 PM   Specimen: BLOOD LEFT FOREARM  Result Value Ref Range Status   Specimen Description   Final    BLOOD LEFT FOREARM Performed at [facility] [address], [place], [place] [address]    Special Requests   Final    BOTTLES DRAWN AEROBIC AND ANAEROBIC Blood Culture adequate volume Performed at [facility] [address] [place], [place] [address]    Culture   Final    NO GROWTH < 12 HOURS Performed at [facility] [address], [place], [place] [address]    Report Status PENDING  Incomplete  Blood Culture (routine x 2)     Status: None (Preliminary result)   Collection Time:  10/22/20  4:25 PM   Specimen: BLOOD LEFT HAND  Result Value Ref Range Status   Specimen Description   Final    BLOOD LEFT HAND Performed at [facility] [address] [place], [place] [address]    Special Requests   Final    BOTTLES DRAWN AEROBIC AND ANAEROBIC Blood Culture results may not be optimal due to an inadequate volume of blood received in culture bottles Performed at [facility] [address] [place], [place] [address]    Culture   Final    NO GROWTH < 12 HOURS Performed at [facility] [address] [place], [place] [address]    Report Status PENDING  Incomplete  Respiratory Panel by RT PCR (Flu A&B, Covid) - Nasopharyngeal Swab     Status: None   Collection Time: 10/22/20  6:40 PM   Specimen: Nasopharyngeal Swab  Result Value Ref Range Status   SARS Coronavirus 2 by RT PCR NEGATIVE NEGATIVE Final    Comment: (NOTE) SARS-CoV-2 target nucleic acids are NOT DETECTED.  The SARS-CoV-2 RNA is generally detectable in upper respiratoy specimens during the acute phase of infection. The lowest concentration of SARS-CoV-2 viral copies this assay can detect is 131 copies/mL. A negative result does not preclude SARS-Cov-2 infection and should not be used as the sole basis for treatment or other patient management decisions. A negative result may occur with  improper specimen collection/handling, submission of specimen other than nasopharyngeal swab, presence of viral mutation(s) within the areas targeted by this assay, and inadequate number of viral copies (<131 copies/mL). A negative result must be combined with clinical observations, patient history, and epidemiological information. The expected result is Negative.  Fact Sheet for Patients:  PinkCheek.be  Fact Sheet for Healthcare Providers:  GravelBags.it  This test is no t yet approved or cleared by the [place] FDA and  has been authorized for detection and/or diagnosis of SARS-CoV-2 by FDA under an Emergency Use Authorization (EUA). This EUA will remain  in effect (meaning this test can be used) for the duration of the COVID-19 declaration under Section 564(b)(1) of the Act, 21 U.S.C. section 360bbb-3(b)(1), unless the authorization is terminated or revoked sooner.     Influenza A by PCR NEGATIVE NEGATIVE Final   Influenza B by PCR NEGATIVE NEGATIVE Final  Comment: (NOTE) The Xpert Xpress SARS-CoV-2/FLU/RSV assay is intended as an aid in  the diagnosis of influenza from Nasopharyngeal swab specimens and  should not be used as a sole basis for treatment. Nasal washings and  aspirates are unacceptable for Xpert Xpress SARS-CoV-2/FLU/RSV  testing.  Fact Sheet for Patients: PinkCheek.be  Fact Sheet for Healthcare Providers: GravelBags.it  This test is not yet approved or cleared by the [place] FDA and  has been authorized for detection and/or diagnosis of SARS-CoV-2 by  FDA under an Emergency Use Authorization (EUA). This EUA will remain  in effect (meaning this test can be used) for the duration of the  Covid-19 declaration under Section 564(b)(1) of the Act, 21  U.S.C. section 360bbb-3(b)(1), unless the authorization is  terminated or revoked. Performed at [facility], [place] [address] [place], [place] [address]     Studies/Results: CT Head Wo Contrast  Result Date: 10/22/2020 CLINICAL DATA:  Delirium, sepsis EXAM: CT HEAD WITHOUT CONTRAST TECHNIQUE: Contiguous axial images were obtained from the base of the skull through the vertex without intravenous contrast. COMPARISON:  None. FINDINGS: Brain: No acute infarct or hemorrhage. Lateral ventricles and midline structures are unremarkable. There are no acute extra-axial fluid collections. There is no mass effect. Vascular: No hyperdense vessel or  unexpected calcification. Skull: Normal. Negative for fracture or focal lesion. Sinuses/Orbits: No acute finding. Other: None. IMPRESSION: 1. No acute intracranial process. Electronically Signed   By: Randa Ngo M.D.   On: 10/22/2020 18:24   DG Chest Port 1 View  Result Date: 10/22/2020 CLINICAL DATA:  Sepsis, confusion, fever EXAM: PORTABLE CHEST 1 VIEW COMPARISON:  08/29/2020 FINDINGS: Single frontal view of the chest demonstrates an unremarkable cardiac silhouette. Diffuse interstitial prominence is again seen throughout the lungs, with near complete resolution of the bilateral airspace disease seen previously. There is minimal residual ground-glass opacity at the right lung base. No effusion or pneumothorax. No acute bony abnormalities. IMPRESSION: 1. Marked improvement in the bilateral airspace disease seen previously, compatible with improved pneumonia. 2. Diffuse interstitial prominence persists, likely postinflammatory scarring. Electronically Signed   By: Randa Ngo M.D.   On: 10/22/2020 16:57   CT Renal Stone Study  Result Date: 10/22/2020 CLINICAL DATA:  64 year old female with sepsis. Bilateral ureteral stents. Suspicion of urothelial neoplasm on September CT without and with contrast. EXAM: CT ABDOMEN AND PELVIS WITHOUT CONTRAST TECHNIQUE: Multidetector CT imaging of the abdomen and pelvis was performed following the standard protocol without IV contrast. COMPARISON:  [facility] CT Abdomen and Pelvis 09/09/2020 and earlier. FINDINGS: Lower chest: Small layering left pleural effusion is new since September. And there are increased left paraspinal and/or retrocrural soft tissue nodules (series 2, image 15) measuring up to 15 mm short axis now, versus 9-10 mm in September. No pericardial effusion. No right pleural effusion. Chronic appearing superimposed bilateral lung base disease with architectural distortion and reticular opacities. No definite acute lung base nodule.  Hepatobiliary: Negative noncontrast liver and gallbladder. Pancreas: Negative. Spleen: Negative.  Stable splenule (normal variant). Adrenals/Urinary Tract: Both adrenal glands are now indistinct. Increased bilateral pararenal space soft tissue thickening and inflammatory stranding. Bilateral double-J ureteral stents remain in place and appear appropriately position. However, bilateral hydronephrosis persists and the right renal collecting system is abnormally hyperdense as before. Stranding continues along the course of both ureters to the bladder. Perivesical stranding a has not significantly changed. No discrete bladder mass. Stomach/Bowel: No dilated large or small bowel loops. There is scattered fluid  in the colon. No definite acute bowel inflammation. No free air. No free fluid. Vascular/Lymphatic: Vascular patency is not evaluated in the absence of IV contrast. Normal caliber abdominal aorta. Aortoiliac calcified atherosclerosis. Progressed and now confluent bilateral Peri aortic lymphadenopathy at the level of the kidneys (series 2, image 36) with bilateral lymph node conglomeration is up to 2.5 cm diameter (previously 1.5 cm). Lymphadenopathy tracks caudally in the retroperitoneum to the iliac node stations. Reproductive: Stable, negative noncontrast appearance. Other: Stable presacral stranding since September. No pelvic free fluid. Musculoskeletal: No acute or suspicious osseous lesion identified. IMPRESSION: 1. Progressed retroperitoneal, left paraspinal, and pelvic lymphadenopathy since September compatible with progressive metastatic nodal disease. 2. Bilateral double-J ureteral stents remain appropriately positioned, however, the bilateral renal collecting systems remain dilated and abnormal as described in September, with underlying Urothelial Neoplasm. Persistent bilateral urinary inflammation. 3. New small layering left pleural effusion.  Chronic lung disease. 4. Aortic Atherosclerosis  (ICD10-I70.0). Electronically Signed   By: Genevie Ann M.D.   On: 10/22/2020 18:31    Assessment: 64 yo woman with progressive retroperitoneal LAD and b/l indwelling stents to manage b/l hydronephrosis now with left pyelonephritis.  She has undergone b/l ureteroscopy x 2 without findings of mass in collecting system/ureters/bladder.  She was treated with steroid course for possible retroperitoneal fibrosis.  Repeat imaging showed progressive retroperitoneal LAD.  She has a IR guided biopsy of this LAD scheduled for Friday, 11/12.    Plan: 1. Left pyelonephritis: continue to treat with abx and tailor according to urine cx 2. B/l hydro: stable, needs to continue indwelling ureteral stents which are in good position 3. Retroperitoneal LAD: scheduled for IR guided bx on Friday, 11/12.  I have called IR to let them know pt is admitted to hospital and to keep bx scheduled    Jacalyn Lefevre, MD Urology 10/23/2020, 9:54 AM

## 2020-10-23 NOTE — Progress Notes (Signed)
Initial Nutrition Assessment  DOCUMENTATION CODES:   Not applicable  INTERVENTION:  - will order Ensure Plus BID, each supplement provides 350 kcal and 16 grams protein. - will order 1 tablet multivitamin with minerals. - will complete NFPE at follow-up.   NUTRITION DIAGNOSIS:   Increased nutrient needs related to acute illness as evidenced by estimated needs.  GOAL:   Patient will meet greater than or equal to 90% of their needs  MONITOR:   PO intake, Supplement acceptance, Labs, Weight trends  REASON FOR ASSESSMENT:   Malnutrition Screening Tool  ASSESSMENT:   64 y.o. female with medical history of HTN, HLD, hepatitis C s/p treatment in 2013, breakthrough COVID-19 (despite [place] completed 12/2019) requiring hospitalization 08/2020 with continued home O2 need, hydronephrosis s/p bilateral ureteral stents 06/2020, and retroperitoneal lymphadenopathy. She presented to the ED with confusion and diarrhea. CT abdomen indicated worsening lymphadenopathy and L pyelonephritis.  No intakes documented since admission. Unable to see patient this afternoon. She is noted to be disoriented x4; did not attempt to call.   Weight yesterday was 187 lb and weight has been fluctuating frequently over the past 2 months, but current weight is the lowest weight throughout this time frame. Patient at risk for malnutrition given current mentation and recent COVID-19 infection.   Per notes: - sepsis 2/2 UTI - diarrhea--c.diff negative, pending GI panel - plan for IR biopsy on 11/12 d/t retroperitoneal lymphadenopathy - acute metabolic encephalopathy  - hyponatremia thought to be 2/2 GI losses   Labs reviewed; Na: 126 mmol/l, K: 3 mmol/l, creatinine: 1.1 mg/dl, Ca: 7.2 mg/dl, GFR: 56 ml/min. Medications reviewed; 40 mg oral protonix/day.  IVF; NS-20 mEq IV KCl @ 100 ml/hr.     NUTRITION - FOCUSED PHYSICAL EXAM:  unable to complete at this time.   Diet Order:   Diet Order            Diet  Heart Room service appropriate? Yes; Fluid consistency: Thin; Fluid restriction: 1200 mL Fluid  Diet effective now                 EDUCATION NEEDS:   No education needs have been identified at this time  Skin:  Skin Assessment: Reviewed RN Assessment  Last BM:  11/10 (type 7)  Height:   Ht Readings from Last 1 Encounters:  10/22/20 5\' 6"  (1.676 m)    Weight:   Wt Readings from Last 1 Encounters:  10/22/20 84.7 kg     Estimated Nutritional Needs:  Kcal:  1600-1800 kcal Protein:  75-85 grams Fluid:  >/= 2 L/day      Jarome Matin, MS, RD, LDN, CNSC Inpatient Clinical Dietitian RD pager # available in [facility]  After hours/weekend pager # available in [facility]

## 2020-10-23 NOTE — Progress Notes (Signed)
Pharmacy Penicillin Allergy Assessment  Spoke with patient to clarify penicillin allergy. Patient reports she experienced hives when she took penicillin and amoxicillin in the past. Patient does not recall when this occurred, but reports it was a long time ago and that she was an adult when it happened. Patient would likely require penicillin skin test if indicated to clarify allergy.  Patient received first dose of cefepime last night and is tolerating well. We have no concerns with cephalosporin use at this time.   Esmeralda Links (PharmD Candidate 2022)

## 2020-10-23 NOTE — Progress Notes (Signed)
   10/23/20 0200  Assess: MEWS Score  Temp (!) 101.7 F (38.7 C)  BP (!) 149/70  Pulse Rate 98  Resp 20  SpO2 95 %  O2 Device Room Air  Assess: MEWS Score  MEWS Temp 2  MEWS Systolic 0  MEWS Pulse 0  MEWS RR 0  MEWS LOC 0  MEWS Score 2  MEWS Score Color Yellow  Assess: if the MEWS score is Yellow or Red  Were vital signs taken at a resting state? Yes  Focused Assessment No change from prior assessment  Early Detection of Sepsis Score *See Row Information* High  MEWS guidelines implemented *See Row Information* Yes  Treat  MEWS Interventions Administered prn meds/treatments  Pain Scale 0-10  Pain Score 0  Complains of Fever  Interventions Medication (see MAR)  Take Vital Signs  Increase Vital Sign Frequency  Yellow: Q 2hr X 2 then Q 4hr X 2, if remains yellow, continue Q 4hrs  Escalate  MEWS: Escalate Yellow: discuss with charge nurse/RN and consider discussing with provider and RRT  Notify: Charge Nurse/RN  Name of Charge Nurse/RN Notified Trenda Moots, RN  Date Charge Nurse/RN Notified 10/23/20  Time Charge Nurse/RN Notified [phone]

## 2020-10-24 ENCOUNTER — Inpatient Hospital Stay (HOSPITAL_COMMUNITY): Payer: No Typology Code available for payment source

## 2020-10-24 ENCOUNTER — Other Ambulatory Visit: Payer: Self-pay

## 2020-10-24 DIAGNOSIS — E876 Hypokalemia: Secondary | ICD-10-CM

## 2020-10-24 DIAGNOSIS — A419 Sepsis, unspecified organism: Secondary | ICD-10-CM | POA: Diagnosis not present

## 2020-10-24 DIAGNOSIS — F1021 Alcohol dependence, in remission: Secondary | ICD-10-CM | POA: Diagnosis not present

## 2020-10-24 DIAGNOSIS — F418 Other specified anxiety disorders: Secondary | ICD-10-CM | POA: Diagnosis not present

## 2020-10-24 DIAGNOSIS — E871 Hypo-osmolality and hyponatremia: Secondary | ICD-10-CM | POA: Diagnosis not present

## 2020-10-24 LAB — URINE CULTURE: Culture: NO GROWTH

## 2020-10-24 LAB — CBC
HCT: 28.8 % — ABNORMAL LOW (ref 36.0–46.0)
Hemoglobin: 8.8 g/dL — ABNORMAL LOW (ref 12.0–15.0)
MCH: 28.4 pg (ref 26.0–34.0)
MCHC: 30.6 g/dL (ref 30.0–36.0)
MCV: 92.9 fL (ref 80.0–100.0)
Platelets: 339 10*3/uL (ref 150–400)
RBC: 3.1 MIL/uL — ABNORMAL LOW (ref 3.87–5.11)
RDW: 15.8 % — ABNORMAL HIGH (ref 11.5–15.5)
WBC: 12.6 10*3/uL — ABNORMAL HIGH (ref 4.0–10.5)
nRBC: 0 % (ref 0.0–0.2)

## 2020-10-24 LAB — BASIC METABOLIC PANEL
Anion gap: 7 (ref 5–15)
BUN: 10 mg/dL (ref 8–23)
CO2: 19 mmol/L — ABNORMAL LOW (ref 22–32)
Calcium: 7.7 mg/dL — ABNORMAL LOW (ref 8.9–10.3)
Chloride: 103 mmol/L (ref 98–111)
Creatinine, Ser: 1.1 mg/dL — ABNORMAL HIGH (ref 0.44–1.00)
GFR, Estimated: 56 mL/min — ABNORMAL LOW (ref >60)
Glucose, Bld: 110 mg/dL — ABNORMAL HIGH (ref 70–99)
Potassium: 3.2 mmol/L — ABNORMAL LOW (ref 3.5–5.1)
Sodium: 129 mmol/L — ABNORMAL LOW (ref 135–145)

## 2020-10-24 LAB — C DIFFICILE QUICK SCREEN W PCR REFLEX
C Diff antigen: NEGATIVE
C Diff interpretation: NOT DETECTED
C Diff toxin: NEGATIVE

## 2020-10-24 LAB — MAGNESIUM: Magnesium: 1.7 mg/dL (ref 1.7–2.4)

## 2020-10-24 MED ORDER — MIDAZOLAM HCL 2 MG/2ML IJ SOLN
INTRAMUSCULAR | Status: AC | PRN
  Administered 2020-10-24: 1 mg via INTRAVENOUS
  Administered 2020-10-24 (×2): 0.5 mg via INTRAVENOUS

## 2020-10-24 MED ORDER — MAGNESIUM SULFATE 2 GM/50ML IV SOLN
2.0000 g | Freq: Once | INTRAVENOUS | Status: AC
  Administered 2020-10-24: 2 g via INTRAVENOUS
  Filled 2020-10-24: qty 50

## 2020-10-24 MED ORDER — SODIUM CHLORIDE 0.9 % IV SOLN
INTRAVENOUS | Status: AC | PRN
  Administered 2020-10-24: 10 mL/h via INTRAVENOUS

## 2020-10-24 MED ORDER — POTASSIUM CHLORIDE CRYS ER 20 MEQ PO TBCR
40.0000 meq | EXTENDED_RELEASE_TABLET | Freq: Once | ORAL | Status: AC
  Administered 2020-10-24: 40 meq via ORAL
  Filled 2020-10-24: qty 2

## 2020-10-24 MED ORDER — FENTANYL CITRATE (PF) 100 MCG/2ML IJ SOLN
INTRAMUSCULAR | Status: AC | PRN
Start: 2020-10-24 — End: 2020-10-24
  Administered 2020-10-24 (×4): 25 ug via INTRAVENOUS

## 2020-10-24 MED ORDER — FENTANYL CITRATE (PF) 100 MCG/2ML IJ SOLN
INTRAMUSCULAR | Status: AC
  Filled 2020-10-24: qty 2

## 2020-10-24 MED ORDER — HYDROCODONE-ACETAMINOPHEN 5-325 MG PO TABS
1.0000 | ORAL_TABLET | Freq: Once | ORAL | Status: AC
  Administered 2020-10-24: 2 via ORAL
  Filled 2020-10-24: qty 2

## 2020-10-24 MED ORDER — IOHEXOL 300 MG/ML  SOLN
100.0000 mL | Freq: Once | INTRAMUSCULAR | Status: AC | PRN
  Administered 2020-10-24: 100 mL via INTRAVENOUS

## 2020-10-24 MED ORDER — MIDAZOLAM HCL 2 MG/2ML IJ SOLN
INTRAMUSCULAR | Status: AC
  Filled 2020-10-24: qty 4

## 2020-10-24 MED ORDER — THIAMINE HCL 100 MG PO TABS
100.0000 mg | ORAL_TABLET | Freq: Every day | ORAL | Status: DC
  Administered 2020-10-25: 100 mg via ORAL
  Filled 2020-10-24: qty 1

## 2020-10-24 NOTE — Sedation Documentation (Signed)
Patient is resting comfortably. 

## 2020-10-24 NOTE — Procedures (Signed)
Pre procedural Dx: Retroperitoneal lymphadenopathy Post procedural Dx: Same  Technically successful CT guided biopsy of indeterminate left sided retroperitoneal lymph node conglomeration.    EBL: None.  Complications: None immediate.   Ronny Bacon, MD Pager #: [phone]

## 2020-10-24 NOTE — Consult Note (Signed)
   [facility] CM Inpatient Consult   10/24/2020  SHEKETA ENDE [birth year] [id number]   [facility] [ACO] Patient:  [place] plan  Patient is currently assigned with [facility] RN for chronic disease management RN Care Management Coordinator for the [place], for resource and post hospital support.   Patient will receive a post hospital call and will be evaluated for assessments and disease process education when transitioning back to the community level of care.       Plan: Notified inpatient TOC RNCM of above.  Will follow progress and disposition needs. Patient will be followed by [facility] Coordinator.   For additional questions or referrals please contact:   Natividad Brood, RN BSN [facility] Liaison  [phone] business mobile phone Toll free office [phone]  Fax number: [phone] [email or web address]

## 2020-10-24 NOTE — Progress Notes (Signed)
Biopsy site from procedure

## 2020-10-24 NOTE — Consult Note (Signed)
Chief Complaint: Patient was seen in consultation today for CT guided biopsy of retroperitoneal lymph node Chief Complaint  Patient presents with  . Altered Mental Status    Referring Physician(s): Pace,M  Supervising Physician: Sandi Mariscal  Patient Status: [facility] - In-pt  History of Present Illness: Mary Stark is a 64 y.o. female with past medical history of alcohol/tobacco abuse, anxiety, arthritis, depression, GERD, hepatitis C, nephrolithiasis, hyperlipidemia, hypertension, vitamin D deficiency, breakthrough COVID-19 requiring hospitalization in September of this year with continued home oxygen requirement, hydronephrosis status post bilateral ureteral stents in July of this year and retroperitoneal lymphadenopathy. She was originally scheduled to undergo biopsy of retroperitoneal lymph node as outpatient but was admitted to [facility] on 11/9 with confusion, diarrhea and left pyelonephritis.  Plans are now to proceed with retroperitoneal lymph node biopsy today.  Past Medical History:  Diagnosis Date  . Alcohol dependence ([place])   . Anxiety   . Arthritis    Back and lt knee  . Cancer ([place])    skin on nose  . Depression 07/17/2013  . GERD (gastroesophageal reflux disease)   . History of hepatitis C    TX FOR 2012  . History of kidney stones   . History of rheumatic fever AS CHILD   NO PROBLEMS FROM  . Hyperlipidemia 07/17/2013  . Hypertension   . Seasonal allergies   . Toe fracture, right 10/07/2016  . Vitamin D deficiency 07/17/2013    Past Surgical History:  Procedure Laterality Date  . ANAL RECTAL MANOMETRY N/A 01/31/2020   Procedure: ANO RECTAL MANOMETRY;  Surgeon: Arta Silence, MD;  Location: [facility];  Service: Endoscopy;  Laterality: N/A;  . [name]  . CYSTOSCOPY WITH RETROGRADE PYELOGRAM, URETEROSCOPY AND STENT PLACEMENT Bilateral 06/11/2020   Procedure: CYSTOSCOPY WITH RETROGRADE PYELOGRAM, URETEROSCOPY AND STENT PLACEMENT,  RIGHT URETERAL BIOPSY;  Surgeon: Robley Fries, MD;  Location: [facility];  Service: Urology;  Laterality: Bilateral;  90 MINS  . CYSTOSCOPY WITH RETROGRADE PYELOGRAM, URETEROSCOPY AND STENT PLACEMENT Bilateral 07/05/2020   Procedure: CYSTOSCOPY WITH RETROGRADE PYELOGRAM, URETEROSCOPY,  BIOPSIES AND STENT EXCHANGES;  Surgeon: Robley Fries, MD;  Location: [facility];  Service: Urology;  Laterality: Bilateral;  . FINGER SURGERY Right    5th  . GYNECOLOGIC CRYOSURGERY  YRS AGO  . TONSILLECTOMY  AS CHILD    Allergies: Metformin and related, Celebrex [celecoxib], and Penicillins  Medications: Prior to Admission medications   Medication Sig Start Date End Date Taking? Authorizing Provider  acetaminophen (TYLENOL) 500 MG tablet Take 1,000 mg by mouth every 8 (eight) hours as needed for moderate pain.    Yes [provider]  albuterol (PROAIR HFA) 108 (90 Base) MCG/ACT inhaler INHALE 1 TO 2 PUFFS BY MOUTH INTO THE LUNGS EVERY 4 HOURS AS NEEDED FOR WHEEZING OR SHORTNESS OF BREATH Patient taking differently: Inhale 1-2 puffs into the lungs every 4 (four) hours as needed for wheezing or shortness of breath. INHALE 1 TO 2 PUFFS BY MOUTH INTO THE LUNGS EVERY 4 HOURS AS NEEDED FOR WHEEZING OR SHORTNESS OF BREATH 08/28/20  Yes [name], DO  amLODipine (NORVASC) 5 MG tablet TAKE 1 TABLET BY MOUTH ONCE DAILY Patient taking differently: Take 5 mg by mouth every evening.  06/13/20  Yes [name], DO  ARIPiprazole (ABILIFY) 5 MG tablet Take 5 mg by mouth at bedtime.    Yes [provider]  ascorbic acid (VITAMIN C) 500 MG tablet Take 1,000 mg by  mouth every evening.    Yes [provider]  ASPIRIN LOW DOSE 81 MG EC tablet TAKE 1 TABLET BY MOUTH DAILY. Patient taking differently: Take 81 mg by mouth daily.  04/09/20  Yes [name], DO  buPROPion (WELLBUTRIN XL) 300 MG 24 hr tablet Take 1 tablet (300 mg total) by mouth daily. 11/22/19  Yes  Plovsky, Berneta Sages, MD  BYSTOLIC 20 MG TABS TAKE 1 TABLET (20 MG TOTAL) BY MOUTH DAILY. Patient taking differently: Take 20 mg by mouth daily.  10/17/20  Yes [name], DO  Cholecalciferol (VITAMIN D) 125 MCG (5000 UT) CAPS Take 5,000 Units by mouth every evening.    Yes [provider]  clonazePAM (KLONOPIN) 0.5 MG tablet Take 0.5 mg by mouth at bedtime.  04/15/20  Yes [provider]  Coenzyme Q10 (CO Q-10) 200 MG CAPS Take 200 mg by mouth every evening.    Yes [provider]  DULoxetine (CYMBALTA) 30 MG capsule Take 30 mg by mouth daily.  09/17/20  Yes [provider]  fexofenadine (ALLEGRA) 180 MG tablet Take 180 mg by mouth every evening.    Yes [provider]  furosemide (LASIX) 20 MG tablet Take 20mg  on day 1 and 2 then every other day. Patient taking differently: Take 20 mg by mouth as needed for edema.  09/13/20  Yes Early, [name], NP  gabapentin (NEURONTIN) 300 MG capsule 1  qam   4   qhs Patient taking differently: Take 300-1,200 mg by mouth See admin instructions. Take 300 mg by mouth in the morning and afternoon and 1200 mg at night 11/22/19  Yes Plovsky, Berneta Sages, MD  GLUCOSAMINE-CHONDROITIN PO Take 2 tablets by mouth every evening.    Yes [provider]  guaiFENesin-dextromethorphan (ROBITUSSIN DM) 100-10 MG/5ML syrup Take 10 mLs by mouth every 4 (four) hours as needed for cough. 08/28/20  Yes [name], DO  Omega-3 Fatty Acids (FISH OIL) 1200 MG CAPS Take 1,200 mg by mouth every evening.    Yes [provider]  omeprazole (PRILOSEC) 20 MG capsule Take 1 capsule (20 mg total) by mouth daily. Patient taking differently: Take 20 mg by mouth every evening.  05/30/20  Yes [name], DO  ondansetron (ZOFRAN-ODT) 4 MG disintegrating tablet Take 1 tablet (4 mg total) by mouth every 8 (eight) hours as needed for nausea or vomiting. 06/03/20  Yes Gerarda Fraction, Erin O, PA-C  simvastatin (ZOCOR) 20 MG tablet TAKE 1 TABLET  (20 MG TOTAL) BY MOUTH DAILY. Patient taking differently: Take 20 mg by mouth daily at 6 PM.  09/19/20  Yes [name], DO  tamsulosin (FLOMAX) 0.4 MG CAPS capsule Take 0.4 mg by mouth at bedtime. 08/07/20  Yes [provider]  vitamin B-12 (CYANOCOBALAMIN) 1000 MCG tablet Take 2,000 mcg by mouth every evening.    Yes [provider]  Vitamin E 180 MG (400 UNIT) CAPS Take 400 Units by mouth every evening.    Yes [provider]  predniSONE (DELTASONE) 10 MG tablet 4 tabs for 2 days 3 tabs for 2 days 2 tabs for 2 days 1 tab for 2 days and continue your current therapy Patient not taking: Reported on 10/23/2020 08/21/20   Barb Merino, MD     Family History  Problem Relation Age of Onset  . Ovarian cancer Mother   . Cardiomyopathy Father   . Valvular heart disease Father   . Liver cancer Brother   . Protein C deficiency Brother   . Protein  C deficiency Brother   . Stroke Brother     Social History   Socioeconomic History  . Marital status: Married    Spouse name: Not on file  . Number of children: Not on file  . Years of education: Not on file  . Highest education level: Not on file  Occupational History  . Not on file  Tobacco Use  . Smoking status: Former Smoker    Packs/day: 1.00    Years: 25.00    Pack years: 25.00    Types: Cigarettes    Quit date: 2012    Years since quitting: 9.8  . Smokeless tobacco: Never Used  Vaping Use  . Vaping Use: Every day  . Substances: Nicotine  Substance and Sexual Activity  . Alcohol use: Not Currently    Alcohol/week: 24.0 - 36.0 standard drinks    Types: 24 - 36 Cans of beer per week    Comment: Pt reports that she stopped march 2021  . Drug use: No  . Sexual activity: Yes    Birth control/protection: None  Other Topics Concern  . Not on file  Social History Narrative  . Not on file   Social Determinants of Health   Financial Resource Strain:   . Difficulty of Paying Living Expenses: Not on  file  Food Insecurity:   . Worried About Charity fundraiser in the Last Year: Not on file  . Ran Out of Food in the Last Year: Not on file  Transportation Needs:   . Lack of Transportation (Medical): Not on file  . Lack of Transportation (Non-Medical): Not on file  Physical Activity:   . Days of Exercise per Week: Not on file  . Minutes of Exercise per Session: Not on file  Stress:   . Feeling of Stress : Not on file  Social Connections:   . Frequency of Communication with Friends and Family: Not on file  . Frequency of Social Gatherings with Friends and Family: Not on file  . Attends Religious Services: Not on file  . Active Member of Clubs or Organizations: Not on file  . Attends Archivist Meetings: Not on file  . Marital Status: Not on file      Review of Systems currently denies fever, headache, chest pain, worsening dyspnea, cough, nausea, vomiting or bleeding.  Does have some abdominal and back discomfort.  Vital Signs: BP 124/69 (BP Location: Left Arm)   Pulse 77   Temp 98.8 F (37.1 C) (Oral)   Resp 20   Ht 5\' 6"  (1.676 m)   Wt 186 lb 11.2 oz (84.7 kg)   SpO2 97%   BMI 30.13 kg/m   Physical Exam awake, alert to name, date of birth and day of week.  Chest with slightly diminished breath sounds left base, right clear.  Heart with regular rate and rhythm.  Abdomen soft, positive bowel sounds, some mild diffuse tenderness to palpation.  Extremities with full range of motion.  Imaging: CT Head Wo Contrast  Result Date: 10/22/2020 CLINICAL DATA:  Delirium, sepsis EXAM: CT HEAD WITHOUT CONTRAST TECHNIQUE: Contiguous axial images were obtained from the base of the skull through the vertex without intravenous contrast. COMPARISON:  None. FINDINGS: Brain: No acute infarct or hemorrhage. Lateral ventricles and midline structures are unremarkable. There are no acute extra-axial fluid collections. There is no mass effect. Vascular: No hyperdense vessel or unexpected  calcification. Skull: Normal. Negative for fracture or focal lesion. Sinuses/Orbits: No acute finding. Other: None.  IMPRESSION: 1. No acute intracranial process. Electronically Signed   By: Randa Ngo M.D.   On: 10/22/2020 18:24   DG Chest Port 1 View  Result Date: 10/22/2020 CLINICAL DATA:  Sepsis, confusion, fever EXAM: PORTABLE CHEST 1 VIEW COMPARISON:  08/29/2020 FINDINGS: Single frontal view of the chest demonstrates an unremarkable cardiac silhouette. Diffuse interstitial prominence is again seen throughout the lungs, with near complete resolution of the bilateral airspace disease seen previously. There is minimal residual ground-glass opacity at the right lung base. No effusion or pneumothorax. No acute bony abnormalities. IMPRESSION: 1. Marked improvement in the bilateral airspace disease seen previously, compatible with improved pneumonia. 2. Diffuse interstitial prominence persists, likely postinflammatory scarring. Electronically Signed   By: Randa Ngo M.D.   On: 10/22/2020 16:57   CT Renal Stone Study  Result Date: 10/22/2020 CLINICAL DATA:  64 year old female with sepsis. Bilateral ureteral stents. Suspicion of urothelial neoplasm on September CT without and with contrast. EXAM: CT ABDOMEN AND PELVIS WITHOUT CONTRAST TECHNIQUE: Multidetector CT imaging of the abdomen and pelvis was performed following the standard protocol without IV contrast. COMPARISON:  [facility] CT Abdomen and Pelvis 09/09/2020 and earlier. FINDINGS: Lower chest: Small layering left pleural effusion is new since September. And there are increased left paraspinal and/or retrocrural soft tissue nodules (series 2, image 15) measuring up to 15 mm short axis now, versus 9-10 mm in September. No pericardial effusion. No right pleural effusion. Chronic appearing superimposed bilateral lung base disease with architectural distortion and reticular opacities. No definite acute lung base nodule.  Hepatobiliary: Negative noncontrast liver and gallbladder. Pancreas: Negative. Spleen: Negative.  Stable splenule (normal variant). Adrenals/Urinary Tract: Both adrenal glands are now indistinct. Increased bilateral pararenal space soft tissue thickening and inflammatory stranding. Bilateral double-J ureteral stents remain in place and appear appropriately position. However, bilateral hydronephrosis persists and the right renal collecting system is abnormally hyperdense as before. Stranding continues along the course of both ureters to the bladder. Perivesical stranding a has not significantly changed. No discrete bladder mass. Stomach/Bowel: No dilated large or small bowel loops. There is scattered fluid in the colon. No definite acute bowel inflammation. No free air. No free fluid. Vascular/Lymphatic: Vascular patency is not evaluated in the absence of IV contrast. Normal caliber abdominal aorta. Aortoiliac calcified atherosclerosis. Progressed and now confluent bilateral Peri aortic lymphadenopathy at the level of the kidneys (series 2, image 36) with bilateral lymph node conglomeration is up to 2.5 cm diameter (previously 1.5 cm). Lymphadenopathy tracks caudally in the retroperitoneum to the iliac node stations. Reproductive: Stable, negative noncontrast appearance. Other: Stable presacral stranding since September. No pelvic free fluid. Musculoskeletal: No acute or suspicious osseous lesion identified. IMPRESSION: 1. Progressed retroperitoneal, left paraspinal, and pelvic lymphadenopathy since September compatible with progressive metastatic nodal disease. 2. Bilateral double-J ureteral stents remain appropriately positioned, however, the bilateral renal collecting systems remain dilated and abnormal as described in September, with underlying Urothelial Neoplasm. Persistent bilateral urinary inflammation. 3. New small layering left pleural effusion.  Chronic lung disease. 4. Aortic Atherosclerosis  (ICD10-I70.0). Electronically Signed   By: Genevie Ann M.D.   On: 10/22/2020 18:31    Labs:  CBC: Recent Labs    09/19/20 1456 10/22/20 1440 10/23/20 0449 10/24/20 0430  WBC 8.7 18.9* 16.8* 12.6*  HGB 8.9* 10.9* 8.6* 8.8*  HCT 27.1* 34.5* 27.7* 28.8*  PLT 390 461* 317 339    COAGS: Recent Labs    10/22/20 1625  INR 1.2  APTT 35  BMP: Recent Labs    09/05/20 0832 09/05/20 0832 09/12/20 1215 09/12/20 1215 09/19/20 1456 09/19/20 1456 09/22/20 1330 10/22/20 1440 10/23/20 0449 10/24/20 0430  NA 135   < > 135   < > 134*   < > 135 123* 126* 129*  K 4.4   < > 4.4   < > 4.3   < > 4.2 4.2 3.0* 3.2*  CL 99   < > 103   < > 101   < > 99 88* 99 103  CO2 27   < > 23   < > 23   < > 27 23 19* 19*  GLUCOSE 90   < > 82   < > 83   < > 85 106* 99 110*  BUN 18   < > 12   < > 12   < > 8 18 17 10   CALCIUM 8.5*   < > 8.3*   < > 8.0*   < > 8.3* 8.8* 7.2* 7.7*  CREATININE 1.00*   < > 0.99   < > 1.06*   < > 0.83 1.32* 1.10* 1.10*  GFRNONAA 60   < > 61   < > 56*   < > 75 45* 56* 56*  GFRAA 69  --  70  --  65  --  87  --   --   --    < > = values in this interval not displayed.    LIVER FUNCTION TESTS: Recent Labs    08/21/20 0401 08/21/20 0401 08/29/20 1709 09/05/20 0832 09/19/20 1456 09/22/20 1330 10/22/20 1440 10/23/20 0449  BILITOT 0.3   < > 0.3   < > 0.3 0.3 0.5 0.6  AST 23   < > 14*   < > 12 32 21 19  ALT 17   < > 15   < > 6 16 11 10   ALKPHOS 61  --  59  --   --   --  70 50  PROT 6.3*   < > 7.1   < > 6.3 7.2 10.6* 7.9  ALBUMIN 2.5*  --  2.6*  --   --   --  3.3* 2.6*   < > = values in this interval not displayed.    TUMOR MARKERS: No results for input(s): AFPTM, CEA, CA199, CHROMGRNA in the last 8760 hours.  Assessment and Plan:  64 y.o. female with past medical history of alcohol/tobacco abuse, anxiety, arthritis, depression, GERD, hepatitis C, nephrolithiasis, hyperlipidemia, hypertension, vitamin D deficiency, breakthrough COVID-19 requiring hospitalization in  September of this year with continued home oxygen requirement, hydronephrosis status post bilateral ureteral stents in July of this year and retroperitoneal lymphadenopathy. She was originally scheduled to undergo biopsy of retroperitoneal lymph node as outpatient but was admitted to [facility] on 11/9 with confusion, diarrhea and left pyelonephritis.  Plans are now to proceed with retroperitoneal lymph node biopsy today.  Imaging studies have been reviewed by Dr. Pascal Lux.Risks and benefits of procedure was discussed with the patient/spouse including, but not limited to bleeding, infection, damage to adjacent structures or low yield requiring additional tests.  All of the questions were answered and there is agreement to proceed.  Consent signed and in chart.    Thank you for this interesting consult.  I greatly enjoyed meeting Mary Stark and look forward to participating in their care.  A copy of this report was sent to the requesting provider on this date.  Electronically Signed: D.  Rowe Robert, PA-C 10/24/2020, 11:58 AM   I spent a total of  30 minutes   in face to face in clinical consultation, greater than 50% of which was counseling/coordinating care for CT-guided biopsy of retroperitoneal lymph node

## 2020-10-24 NOTE — Progress Notes (Signed)
MEDICATION-RELATED CONSULT NOTE   IR Procedure Consult - Anticoagulant/Antiplatelet PTA/Inpatient Med List Review by Pharmacist    Procedure: lymph node biopsy    Completed: 10/24/20 @ 1249  Post-Procedural bleeding risk per IR MD assessment: STANDARD  Antithrombotic medications on inpatient profile prior to procedure:  Enoxaparin 40 mg SQ daily PTA antithrombotic medications:  Aspirin 81 mg daily    Recommended restart time per IR Post-Procedure Guidelines:  POD1 (AM)   Other considerations:    Lovenox ordered qHS, with next dose tonight (no doses held for procedure)  Does have hyperlipidemia, but no evidence of CAD in [place]:      Reschedule Lovenox to resume tomorrow AM (Daily at 10a)  Do not resume ASA  Reuel Boom, PharmD, BCPS [phone] 10/24/2020, 1:42 PM

## 2020-10-24 NOTE — Progress Notes (Signed)
PROGRESS NOTE  Mary Stark  YTK:[id number] DOB: 1956/04/27 DOA: 10/22/2020 PCP: [name], DO   Brief Narrative: SHAMELA HAYDON is a 64 y.o. female with a history including HTN, HLD, hepatitis C s/p treatment 2013, breakthrough covid-19 (despite [place] completed Jan 2021) requiring hospitalization September 2021 with continued home oxygen requirement, hydronephrosis s/p bilateral ureteral stents July 2021 and retroperitoneal lymphadenopathy who presented to the ED 11/9 with confusion and diarrhea. She was found to have pyuria and CT abdomen demonstrating worsening lymphadenopathy and left perinephric stranding consistent with left pyelonephritis. Urology was consulted, confirmed stents were functioning as expected in good place, and recommended no procedures at this time. Mental status remains altered.   Assessment & Plan:  Acute pyelonephritis with severe sepsis present on admission Patient was febrile and had leukocytosis.  She was placed on broad-spectrum antibiotic coverage with cefepime and Flagyl.  Waiting on culture data.  Sepsis physiology appears to have improved.  WBC seems to be getting better.  Cultures negative so far.  Now only on cefepime.  Continue for additional day and then de-escalate tomorrow if there is continued clinical improvement.  Acute metabolic encephalopathy in the setting of depression and anxiety Encephalopathy most likely due to sepsis.  Mentation appears to have improved.  Husband at bedside thinks that she may be back to her baseline.  Home medications being continued including Abilify and clonazepam.  CT head done at the time of admission did not show any acute findings.  Acute on chronic diarrhea Patient mentions that she has had diarrhea for at least 2 months now.  She thinks she may have irritable bowel syndrome.  Her abdomen is benign.  Stool studies were ordered and results are still pending.  It does not appear like stool sample was sent.   Diarrhea seems to be slowing down but she continues to have loose stools.  Apparently has been taking Imodium at home.  We will hold Imodium and antimotility agents till we know for sure that she does not have any infection.  Acute renal failure Secondary to hypovolemia.  Creatinine has improved.  Normocytic anemia Hemoglobin was 10.9 at admission.  She was likely hemoconcentrated.  Hemoglobin dropped to 8.6 yesterday.  8.8 today.  No evidence of overt bleeding.  Retroperitoneal lymphadenopathy Urology has been following her.  Plan is for biopsy by interventional radiology during this hospitalization.  It was initially scheduled to be done as an outpatient on 11/12.  Bilateral hydronephrosis with the presence of bilateral ureteral stents Follow-up with urology  Essential hypertension Continue amlodipine.  Blood pressure reasonably well controlled.  Hypokalemia:  Supplement potassium today as well.  Magnesium 1.7.  Will supplement that as well.  Hyponatremia Likely due to GI losses/hypovolemia.  Sodium is better this morning.  Continue to monitor.  Continue normal saline infusion.  History of alcohol dependence No evidence for withdrawal at this time.  Thiamine.  Tobacco use Cessation counseling once closer to discharge  DVT prophylaxis: Lovenox Code Status: Full Family Communication: Husband updated. Disposition Plan: Hopefully return home when improved.  Mobilize.  Status is: Inpatient  Remains inpatient appropriate because:Persistent severe electrolyte disturbances, Altered mental status, Ongoing diagnostic testing needed not appropriate for outpatient work up and IV treatments appropriate due to intensity of illness or inability to take PO  Dispo: The patient is from: Home              Anticipated d/c is to: TBD  Anticipated d/c date is: 3 days              Patient currently is not medically stable to d/c.  Consultants:   Urology  IR  Procedures:    None  Subjective: She apparently fell a few days ago and fell on her left side and complains of pain over her left rib cage area as well as left hip.  Diarrhea appears to be slowing down.  No chest pain or shortness of breath.  No headaches.  Husband is at the bedside.  Objective: Vitals:   10/23/20 0948 10/23/20 1359 10/23/20 2026 10/24/20 0632  BP: (!) 142/74 (!) 153/83 136/81 124/69  Pulse: 92 85 81 77  Resp: 18 20 20    Temp: 97.7 F (36.5 C) 98.2 F (36.8 C) 97.7 F (36.5 C) 98.8 F (37.1 C)  TempSrc: Oral Oral  Oral  SpO2: 100% 99% 98% 97%  Weight:      Height:        Intake/Output Summary (Last 24 hours) at 10/24/2020 1027 Last data filed at 10/24/2020 0604 Gross per 24 hour  Intake 1200 ml  Output --  Net 1200 ml   Filed Weights   10/22/20 1705 10/22/20 2200  Weight: 86.2 kg 84.7 kg    General appearance: Awake alert.  In no distress Resp: Clear to auscultation bilaterally.  Normal effort Cardio: S1-S2 is normal regular.  No S3-S4.  No rubs murmurs or bruit GI: Abdomen is soft.  Nontender nondistended.  Bowel sounds are present normal.  No masses organomegaly Extremities: No edema.  Able to move all her extremities. Neurologic: She is awake alert.  Oriented to person place time month year.  No focal deficits noted. No bruising noted over the left side of her body.  Full range of motion of the left leg.  No bruising noted over the left chest area.  Though she is tender over the left chest wall .   Data Reviewed: I have personally reviewed following labs and imaging studies  CBC: Recent Labs  Lab 10/22/20 1440 10/23/20 0449 10/24/20 0430  WBC 18.9* 16.8* 12.6*  NEUTROABS  --  10.9*  --   HGB 10.9* 8.6* 8.8*  HCT 34.5* 27.7* 28.8*  MCV 90.3 91.7 92.9  PLT 461* 317 622   Basic Metabolic Panel: Recent Labs  Lab 10/22/20 1440 10/23/20 0449 10/24/20 0430  NA 123* 126* 129*  K 4.2 3.0* 3.2*  CL 88* 99 103  CO2 23 19* 19*  GLUCOSE 106* 99 110*   BUN 18 17 10   CREATININE 1.32* 1.10* 1.10*  CALCIUM 8.8* 7.2* 7.7*  MG  --   --  1.7   GFR: Estimated Creatinine Clearance: 57.4 mL/min (A) (by C-G formula based on SCr of 1.1 mg/dL (H)). Liver Function Tests: Recent Labs  Lab 10/22/20 1440 10/23/20 0449  AST 21 19  ALT 11 10  ALKPHOS 70 50  BILITOT 0.5 0.6  PROT 10.6* 7.9  ALBUMIN 3.3* 2.6*   Recent Labs  Lab 10/22/20 1625  LIPASE 22   Recent Labs  Lab 10/22/20 2104  AMMONIA 12   Coagulation Profile: Recent Labs  Lab 10/22/20 1625  INR 1.2   CBG: Recent Labs  Lab 10/22/20 1607 10/22/20 2314  GLUCAP 105* 105*   Urine analysis:    Component Value Date/Time   COLORURINE YELLOW 10/22/2020 1657   APPEARANCEUR CLOUDY (A) 10/22/2020 1657   LABSPEC 1.017 10/22/2020 1657   PHURINE 6.0 10/22/2020 1657   GLUCOSEU  NEGATIVE 10/22/2020 1657   HGBUR MODERATE (A) 10/22/2020 1657   BILIRUBINUR NEGATIVE 10/22/2020 1657   BILIRUBINUR negative 10/22/2020 1305   KETONESUR 5 (A) 10/22/2020 1657   KETONESUR negative 10/22/2020 1305   PROTEINUR 100 (A) 10/22/2020 1657   PROTEINUR >=300 (A) 10/22/2020 1305   UROBILINOGEN 0.2 10/22/2020 1305   NITRITE NEGATIVE 10/22/2020 1657   NITRITE Positive (A) 10/22/2020 1305   LEUKOCYTESUR LARGE (A) 10/22/2020 1657   LEUKOCYTESUR Small (1+) (A) 10/22/2020 1305   Recent Results (from the past 240 hour(s))  Blood Culture (routine x 2)     Status: None (Preliminary result)   Collection Time: 10/22/20  4:25 PM   Specimen: BLOOD LEFT FOREARM  Result Value Ref Range Status   Specimen Description   Final    BLOOD LEFT FOREARM Performed at [facility], [place] [address], [place], [place] [address]    Special Requests   Final    BOTTLES DRAWN AEROBIC AND ANAEROBIC Blood Culture adequate volume Performed at [facility] [address] [place], [place] [address]    Culture   Final    NO GROWTH 2 DAYS Performed at [facility]  [address] [place], [place] [address]    Report Status PENDING  Incomplete  Blood Culture (routine x 2)     Status: None (Preliminary result)   Collection Time: 10/22/20  4:25 PM   Specimen: BLOOD LEFT HAND  Result Value Ref Range Status   Specimen Description   Final    BLOOD LEFT HAND Performed at [facility] [address] [place], [place] [address]    Special Requests   Final    BOTTLES DRAWN AEROBIC AND ANAEROBIC Blood Culture results may not be optimal due to an inadequate volume of blood received in culture bottles Performed at [facility] [address], [place], [place] [address]    Culture   Final    NO GROWTH 2 DAYS Performed at [facility] [address], [place], [place] [address]    Report Status PENDING  Incomplete  Urine culture     Status: None   Collection Time: 10/22/20  4:57 PM   Specimen: In/Out Cath Urine  Result Value Ref Range Status   Specimen Description   Final    IN/OUT CATH URINE Performed at [facility] [address], [place], [place] [address]    Special Requests   Final    NONE Performed at [facility], [place] [address], [place], [place] [address]    Culture   Final    NO GROWTH Performed at [facility], [place] [address], [place], [place] [address]    Report Status 10/24/2020 FINAL  Final  Respiratory Panel by RT PCR (Flu A&B, Covid) - Nasopharyngeal Swab     Status: None   Collection Time: 10/22/20  6:40 PM   Specimen: Nasopharyngeal Swab  Result Value Ref Range Status   SARS Coronavirus 2 by RT PCR NEGATIVE NEGATIVE Final    Comment: (NOTE) SARS-CoV-2 target nucleic acids are NOT DETECTED.  The SARS-CoV-2 RNA is generally detectable in upper respiratoy specimens during the acute phase of infection. The lowest concentration of SARS-CoV-2 viral copies this assay can detect is 131 copies/mL. A negative result does not preclude SARS-Cov-2 infection and  should not be used as the sole basis for treatment or other patient management decisions. A negative result may occur with  improper specimen collection/handling, submission of specimen other than nasopharyngeal swab, presence of viral mutation(s) within the areas targeted by this assay, and inadequate number of viral copies (<131 copies/mL). A negative result must be combined with clinical observations, patient history, and epidemiological information. The expected result is Negative.  Fact Sheet for Patients:  PinkCheek.be  Fact Sheet for Healthcare Providers:  GravelBags.it  This test is no t yet approved or cleared by the [place] FDA and  has been authorized for detection and/or diagnosis of SARS-CoV-2 by FDA under an Emergency Use Authorization (EUA). This EUA will remain  in effect (meaning this test can be used) for the duration of the COVID-19 declaration under Section 564(b)(1) of the Act, 21 U.S.C. section 360bbb-3(b)(1), unless the authorization is terminated or revoked sooner.     Influenza A by PCR NEGATIVE NEGATIVE Final   Influenza B by PCR NEGATIVE NEGATIVE Final    Comment: (NOTE) The Xpert Xpress SARS-CoV-2/FLU/RSV assay is intended as an aid in  the diagnosis of influenza from Nasopharyngeal swab specimens and  should not be used as a sole basis for treatment. Nasal washings and  aspirates are unacceptable for Xpert Xpress SARS-CoV-2/FLU/RSV  testing.  Fact Sheet for Patients: PinkCheek.be  Fact Sheet for Healthcare Providers: GravelBags.it  This test is not yet approved or cleared by the [place] FDA and  has been authorized for detection and/or diagnosis of SARS-CoV-2 by  FDA under an Emergency Use Authorization (EUA). This EUA will remain  in effect (meaning this test can be used) for the duration of the  Covid-19 declaration  under Section 564(b)(1) of the Act, 21  U.S.C. section 360bbb-3(b)(1), unless the authorization is  terminated or revoked. Performed at [facility], [place] [address], [place], [place] [address]       Radiology Studies: CT Head Wo Contrast  Result Date: 10/22/2020 CLINICAL DATA:  Delirium, sepsis EXAM: CT HEAD WITHOUT CONTRAST TECHNIQUE: Contiguous axial images were obtained from the base of the skull through the vertex without intravenous contrast. COMPARISON:  None. FINDINGS: Brain: No acute infarct or hemorrhage. Lateral ventricles and midline structures are unremarkable. There are no acute extra-axial fluid collections. There is no mass effect. Vascular: No hyperdense vessel or unexpected calcification. Skull: Normal. Negative for fracture or focal lesion. Sinuses/Orbits: No acute finding. Other: None. IMPRESSION: 1. No acute intracranial process. Electronically Signed   By: Randa Ngo M.D.   On: 10/22/2020 18:24   DG Chest Port 1 View  Result Date: 10/22/2020 CLINICAL DATA:  Sepsis, confusion, fever EXAM: PORTABLE CHEST 1 VIEW COMPARISON:  08/29/2020 FINDINGS: Single frontal view of the chest demonstrates an unremarkable cardiac silhouette. Diffuse interstitial prominence is again seen throughout the lungs, with near complete resolution of the bilateral airspace disease seen previously. There is minimal residual ground-glass opacity at the right lung base. No effusion or pneumothorax. No acute bony abnormalities. IMPRESSION: 1. Marked improvement in the bilateral airspace disease seen previously, compatible with improved pneumonia. 2. Diffuse interstitial prominence persists, likely postinflammatory scarring. Electronically Signed   By: Randa Ngo M.D.   On: 10/22/2020 16:57   CT Renal Stone Study  Result Date: 10/22/2020 CLINICAL DATA:  64 year old female with sepsis. Bilateral ureteral stents. Suspicion of urothelial neoplasm on September CT without and with  contrast. EXAM: CT ABDOMEN AND PELVIS WITHOUT CONTRAST TECHNIQUE: Multidetector CT imaging of the abdomen and pelvis was performed following the standard protocol without IV contrast. COMPARISON:  [facility] CT Abdomen and Pelvis 09/09/2020  and earlier. FINDINGS: Lower chest: Small layering left pleural effusion is new since September. And there are increased left paraspinal and/or retrocrural soft tissue nodules (series 2, image 15) measuring up to 15 mm short axis now, versus 9-10 mm in September. No pericardial effusion. No right pleural effusion. Chronic appearing superimposed bilateral lung base disease with architectural distortion and reticular opacities. No definite acute lung base nodule. Hepatobiliary: Negative noncontrast liver and gallbladder. Pancreas: Negative. Spleen: Negative.  Stable splenule (normal variant). Adrenals/Urinary Tract: Both adrenal glands are now indistinct. Increased bilateral pararenal space soft tissue thickening and inflammatory stranding. Bilateral double-J ureteral stents remain in place and appear appropriately position. However, bilateral hydronephrosis persists and the right renal collecting system is abnormally hyperdense as before. Stranding continues along the course of both ureters to the bladder. Perivesical stranding a has not significantly changed. No discrete bladder mass. Stomach/Bowel: No dilated large or small bowel loops. There is scattered fluid in the colon. No definite acute bowel inflammation. No free air. No free fluid. Vascular/Lymphatic: Vascular patency is not evaluated in the absence of IV contrast. Normal caliber abdominal aorta. Aortoiliac calcified atherosclerosis. Progressed and now confluent bilateral Peri aortic lymphadenopathy at the level of the kidneys (series 2, image 36) with bilateral lymph node conglomeration is up to 2.5 cm diameter (previously 1.5 cm). Lymphadenopathy tracks caudally in the retroperitoneum to the iliac  node stations. Reproductive: Stable, negative noncontrast appearance. Other: Stable presacral stranding since September. No pelvic free fluid. Musculoskeletal: No acute or suspicious osseous lesion identified. IMPRESSION: 1. Progressed retroperitoneal, left paraspinal, and pelvic lymphadenopathy since September compatible with progressive metastatic nodal disease. 2. Bilateral double-J ureteral stents remain appropriately positioned, however, the bilateral renal collecting systems remain dilated and abnormal as described in September, with underlying Urothelial Neoplasm. Persistent bilateral urinary inflammation. 3. New small layering left pleural effusion.  Chronic lung disease. 4. Aortic Atherosclerosis (ICD10-I70.0). Electronically Signed   By: Genevie Ann M.D.   On: 10/22/2020 18:31    Scheduled Meds: . amLODipine  5 mg Oral QPM  . ARIPiprazole  5 mg Oral QHS  . clonazePAM  0.5 mg Oral QHS  . enoxaparin (LOVENOX) injection  40 mg Subcutaneous Q24H  . feeding supplement  237 mL Oral BID BM  . multivitamin with minerals  1 tablet Oral Daily  . pantoprazole  40 mg Oral Daily  . potassium chloride  40 mEq Oral Once  . simvastatin  20 mg Oral q1800  . tamsulosin  0.4 mg Oral QHS   Continuous Infusions: . 0.9 % NaCl with KCl 20 mEq / L 100 mL/hr at 10/24/20 1021  . ceFEPime (MAXIPIME) IV 2 g (10/24/20 1026)  . magnesium sulfate bolus IVPB       LOS: 2 days   Time spent: 25 minutes.  Bonnielee Haff, MD [facility] [email or web address] 10/24/2020, 10:27 AM

## 2020-10-25 ENCOUNTER — Other Ambulatory Visit (HOSPITAL_COMMUNITY): Payer: Self-pay | Admitting: Internal Medicine

## 2020-10-25 DIAGNOSIS — A419 Sepsis, unspecified organism: Secondary | ICD-10-CM | POA: Diagnosis not present

## 2020-10-25 DIAGNOSIS — N390 Urinary tract infection, site not specified: Secondary | ICD-10-CM | POA: Diagnosis not present

## 2020-10-25 LAB — CBC
HCT: 27.1 % — ABNORMAL LOW (ref 36.0–46.0)
Hemoglobin: 8.5 g/dL — ABNORMAL LOW (ref 12.0–15.0)
MCH: 29 pg (ref 26.0–34.0)
MCHC: 31.4 g/dL (ref 30.0–36.0)
MCV: 92.5 fL (ref 80.0–100.0)
Platelets: 355 10*3/uL (ref 150–400)
RBC: 2.93 MIL/uL — ABNORMAL LOW (ref 3.87–5.11)
RDW: 15.8 % — ABNORMAL HIGH (ref 11.5–15.5)
WBC: 11 10*3/uL — ABNORMAL HIGH (ref 4.0–10.5)
nRBC: 0 % (ref 0.0–0.2)

## 2020-10-25 LAB — BASIC METABOLIC PANEL
Anion gap: 7 (ref 5–15)
BUN: 7 mg/dL — ABNORMAL LOW (ref 8–23)
CO2: 17 mmol/L — ABNORMAL LOW (ref 22–32)
Calcium: 7.7 mg/dL — ABNORMAL LOW (ref 8.9–10.3)
Chloride: 107 mmol/L (ref 98–111)
Creatinine, Ser: 0.77 mg/dL (ref 0.44–1.00)
GFR, Estimated: >60 mL/min (ref >60)
Glucose, Bld: 116 mg/dL — ABNORMAL HIGH (ref 70–99)
Potassium: 3.9 mmol/L (ref 3.5–5.1)
Sodium: 131 mmol/L — ABNORMAL LOW (ref 135–145)

## 2020-10-25 MED ORDER — THIAMINE HCL 100 MG PO TABS
100.0000 mg | ORAL_TABLET | Freq: Every day | ORAL | 0 refills | Status: DC
Start: 2020-10-26 — End: 2022-04-17

## 2020-10-25 MED ORDER — CEPHALEXIN 500 MG PO CAPS: 500.0000 mg | ORAL_CAPSULE | Freq: Three times a day (TID) | ORAL | 0 refills | Status: DC

## 2020-10-25 MED ORDER — SODIUM BICARBONATE 650 MG PO TABS: 650.0000 mg | ORAL_TABLET | Freq: Two times a day (BID) | ORAL | 0 refills | Status: DC

## 2020-10-25 MED ORDER — CEPHALEXIN 500 MG PO CAPS
500.0000 mg | ORAL_CAPSULE | Freq: Three times a day (TID) | ORAL | Status: DC
  Administered 2020-10-25 (×2): 500 mg via ORAL
  Filled 2020-10-25 (×2): qty 1

## 2020-10-25 MED FILL — CEPHALEXIN 500 MG CAPSULE: 500 | 7 days supply | Qty: 21 | Fill #0

## 2020-10-25 MED FILL — SODIUM BICARBONATE 650 MG T: 650 | 10 days supply | Qty: 20 | Fill #0

## 2020-10-25 NOTE — Discharge Instructions (Signed)
Urinary Tract Infection, Adult A urinary tract infection (UTI) is an infection of any part of the urinary tract. The urinary tract includes:  The kidneys.  The ureters.  The bladder.  The urethra. These organs make, store, and get rid of pee (urine) in the body. What are the causes? This is caused by germs (bacteria) in your genital area. These germs grow and cause swelling (inflammation) of your urinary tract. What increases the risk? You are more likely to develop this condition if:  You have a small, thin tube (catheter) to drain pee.  You cannot control when you pee or poop (incontinence).  You are female, and: ? You use these methods to prevent pregnancy:  A medicine that kills sperm (spermicide).  A device that blocks sperm (diaphragm). ? You have low levels of a female hormone (estrogen). ? You are pregnant.  You have genes that add to your risk.  You are sexually active.  You take antibiotic medicines.  You have trouble peeing because of: ? A prostate that is bigger than normal, if you are female. ? A blockage in the part of your body that drains pee from the bladder (urethra). ? A kidney stone. ? A nerve condition that affects your bladder (neurogenic bladder). ? Not getting enough to drink. ? Not peeing often enough.  You have other conditions, such as: ? Diabetes. ? A weak disease-fighting system (immune system). ? Sickle cell disease. ? Gout. ? Injury of the spine. What are the signs or symptoms? Symptoms of this condition include:  Needing to pee right away (urgently).  Peeing often.  Peeing small amounts often.  Pain or burning when peeing.  Blood in the pee.  Pee that smells bad or not like normal.  Trouble peeing.  Pee that is cloudy.  Fluid coming from the vagina, if you are female.  Pain in the belly or lower back. Other symptoms include:  Throwing up (vomiting).  No urge to eat.  Feeling mixed up (confused).  Being tired  and grouchy (irritable).  A fever.  Watery poop (diarrhea). How is this treated? This condition may be treated with:  Antibiotic medicine.  Other medicines.  Drinking enough water. Follow these instructions at home:  Medicines  Take over-the-counter and prescription medicines only as told by your doctor.  If you were prescribed an antibiotic medicine, take it as told by your doctor. Do not stop taking it even if you start to feel better. General instructions  Make sure you: ? Pee until your bladder is empty. ? Do not hold pee for a long time. ? Empty your bladder after sex. ? Wipe from front to back after pooping if you are a female. Use each tissue one time when you wipe.  Drink enough fluid to keep your pee pale yellow.  Keep all follow-up visits as told by your doctor. This is important. Contact a doctor if:  You do not get better after 1-2 days.  Your symptoms go away and then come back. Get help right away if:  You have very bad back pain.  You have very bad pain in your lower belly.  You have a fever.  You are sick to your stomach (nauseous).  You are throwing up. Summary  A urinary tract infection (UTI) is an infection of any part of the urinary tract.  This condition is caused by germs in your genital area.  There are many risk factors for a UTI. These include having a small, thin   tube to drain pee and not being able to control when you pee or poop.  Treatment includes antibiotic medicines for germs.  Drink enough fluid to keep your pee pale yellow. This information is not intended to replace advice given to you by your health care provider. Make sure you discuss any questions you have with your health care provider. Document Revised: 11/17/2018 Document Reviewed: 06/09/2018 Elsevier Patient Education  2020 [employer]  

## 2020-10-25 NOTE — Discharge Summary (Signed)
[facility]  Physician Discharge Summary   Patient ID: NILAM QUAKENBUSH MRN: [id number] DOB/AGE: November 21, 1956 64 y.o.  Admit date: 10/22/2020 Discharge date: 10/25/2020  PCP: [name], DO  DISCHARGE DIAGNOSES:  Acute pyelonephritis with severe sepsis Acute metabolic encephalopathy Acute on chronic diarrhea Normocytic anemia Retroperitoneal lymphadenopathy status post biopsy Bilateral hydronephrosis with presence of bilateral ureteral stents Essential hypertension History of alcohol dependence  RECOMMENDATIONS FOR OUTPATIENT FOLLOW UP: 1. Follow-up with urology to discuss pathology report which should be available next week 2. Follow-up with PCP in 1 week   Home Health:None  Equipment/Devices:None   CODE STATUS:Full   DISCHARGE CONDITION: fair  Diet recommendation: As before  INITIAL HISTORY: TRANY CHERNICK is a 64 y.o. female with a history including HTN, HLD, hepatitis C s/p treatment 2013, breakthrough covid-19 (despite [facility] completed Jan 2021) requiring hospitalization September 2021 with continued home oxygen requirement, hydronephrosis s/p bilateral ureteral stents July 2021 and retroperitoneal lymphadenopathy who presented to the ED 11/9 with confusion and diarrhea. She was found to have pyuria and CT abdomen demonstrating worsening lymphadenopathy and left perinephric stranding consistent with left pyelonephritis. Urology was consulted, confirmed stents were functioning as expected in good place, and recommended no procedures at this time. Mental status remains altered.  Consultants:   Urology  IR  Procedures:  CT guided RP LN Bx    HOSPITAL COURSE:   Acute pyelonephritis with severe sepsis present on admission Patient was febrile and had leukocytosis.  She was placed on broad-spectrum antibiotic coverage with cefepime and Flagyl.  Sepsis physiology appears to have improved.  WBC seems to be getting better.  Cultures negative so far.  Was  kept on cefepime for 3 days.  Changed over to Keflex today.  Patient very keen on going home.  She was monitored for several hours after she was given Keflex.  She did not have any reaction. Okay for discharge.  Acute metabolic encephalopathy in the setting of depression and anxiety Encephalopathy most likely due to sepsis.  Now back to baseline.  CT head did not show any acute findings.  Acute on chronic diarrhea Patient mentions that she has had diarrhea for at least 2 months now.  She thinks she may have irritable bowel syndrome.    Stool studies were negative for C. difficile.  Diarrhea appears to have slowed down.  Follow-up with PCP.  May need to be referred to gastroenterology.  Acute renal failure Secondary to hypovolemia.  Creatinine has improved.  Normocytic anemia Hemoglobin was 10.9 at admission.  She was likely hemoconcentrated.  Hemoglobin dropped to the mid 8's.  Stable.  Retroperitoneal lymphadenopathy Urology has been following her.    She underwent biopsy of the retroperitoneal lymph nodes during this hospital stay.  Pathology report is pending.  Will be addressed by urology.    Bilateral hydronephrosis with the presence of bilateral ureteral stents Follow-up with urology  Essential hypertension Continue amlodipine.    Hyponatremia Likely due to GI losses/hypovolemia.    Sodium level improved to 131.    History of alcohol dependence No evidence for withdrawal at this time.  Thiamine.  Tobacco use Cessation counseling once closer to discharge  Obesity Estimated body mass index is 30.13 kg/m as calculated from the following:   Height as of this encounter: 5\' 6"  (1.676 m).   Weight as of this encounter: 84.7 kg.  Overall she remains stable.  Okay for discharge home.   PERTINENT LABS:  The results of significant diagnostics from this hospitalization (  including imaging, microbiology, ancillary and laboratory) are listed below for reference.     Microbiology: Recent Results (from the past 240 hour(s))  Blood Culture (routine x 2)     Status: None (Preliminary result)   Collection Time: 10/22/20  4:25 PM   Specimen: BLOOD LEFT FOREARM  Result Value Ref Range Status   Specimen Description   Final    BLOOD LEFT FOREARM Performed at [facility] [address] [place], [place] [address]    Special Requests   Final    BOTTLES DRAWN AEROBIC AND ANAEROBIC Blood Culture adequate volume Performed at [facility] [address], [place], [place] [address]    Culture   Final    NO GROWTH 3 DAYS Performed at [facility], [place] [address], [place], [place] [address]    Report Status PENDING  Incomplete  Blood Culture (routine x 2)     Status: None (Preliminary result)   Collection Time: 10/22/20  4:25 PM   Specimen: BLOOD LEFT HAND  Result Value Ref Range Status   Specimen Description   Final    BLOOD LEFT HAND Performed at [facility] [address], [place], [place] [address]    Special Requests   Final    BOTTLES DRAWN AEROBIC AND ANAEROBIC Blood Culture results may not be optimal due to an inadequate volume of blood received in culture bottles Performed at [facility] [address] [place], [place] [address]    Culture   Final    NO GROWTH 3 DAYS Performed at [facility], [place] [address] [place], [place] [address]    Report Status PENDING  Incomplete  Urine culture     Status: None   Collection Time: 10/22/20  4:57 PM   Specimen: In/Out Cath Urine  Result Value Ref Range Status   Specimen Description   Final    IN/OUT CATH URINE Performed at [facility] [address], [place], [place] [address]    Special Requests   Final    NONE Performed at [facility], [place] [address], [place], [place] [address]    Culture   Final    NO GROWTH Performed at [facility], [place]  [address], [place], [place] [address]    Report Status 10/24/2020 FINAL  Final  Respiratory Panel by RT PCR (Flu A&B, Covid) - Nasopharyngeal Swab     Status: None   Collection Time: 10/22/20  6:40 PM   Specimen: Nasopharyngeal Swab  Result Value Ref Range Status   SARS Coronavirus 2 by RT PCR NEGATIVE NEGATIVE Final    Comment: (NOTE) SARS-CoV-2 target nucleic acids are NOT DETECTED.  The SARS-CoV-2 RNA is generally detectable in upper respiratoy specimens during the acute phase of infection. The lowest concentration of SARS-CoV-2 viral copies this assay can detect is 131 copies/mL. A negative result does not preclude SARS-Cov-2 infection and should not be used as the sole basis for treatment or other patient management decisions. A negative result may occur with  improper specimen collection/handling, submission of specimen other than nasopharyngeal swab, presence of viral mutation(s) within the areas targeted by this assay, and inadequate number of viral copies (<131 copies/mL). A negative result must be combined with clinical observations, patient history, and epidemiological information. The expected result is Negative.  Fact Sheet for Patients:  PinkCheek.be  Fact Sheet for Healthcare Providers:  GravelBags.it  This test is no t yet  approved or cleared by the [place] and  has been authorized for detection and/or diagnosis of SARS-CoV-2 by FDA under an Emergency Use Authorization (EUA). This EUA will remain  in effect (meaning this test can be used) for the duration of the COVID-19 declaration under Section 564(b)(1) of the Act, 21 U.S.C. section 360bbb-3(b)(1), unless the authorization is terminated or revoked sooner.     Influenza A by PCR NEGATIVE NEGATIVE Final   Influenza B by PCR NEGATIVE NEGATIVE Final    Comment: (NOTE) The Xpert Xpress SARS-CoV-2/FLU/RSV assay is intended as an aid in  the diagnosis of  influenza from Nasopharyngeal swab specimens and  should not be used as a sole basis for treatment. Nasal washings and  aspirates are unacceptable for Xpert Xpress SARS-CoV-2/FLU/RSV  testing.  Fact Sheet for Patients: PinkCheek.be  Fact Sheet for Healthcare Providers: GravelBags.it  This test is not yet approved or cleared by the [place] FDA and  has been authorized for detection and/or diagnosis of SARS-CoV-2 by  FDA under an Emergency Use Authorization (EUA). This EUA will remain  in effect (meaning this test can be used) for the duration of the  Covid-19 declaration under Section 564(b)(1) of the Act, 21  U.S.C. section 360bbb-3(b)(1), unless the authorization is  terminated or revoked. Performed at [facility], [place] [address] [place], [place] [address]   C Difficile Quick Screen w PCR reflex     Status: None   Collection Time: 10/23/20  5:46 PM   Specimen: STOOL  Result Value Ref Range Status   C Diff antigen NEGATIVE NEGATIVE Final   C Diff toxin NEGATIVE NEGATIVE Final   C Diff interpretation No C. difficile detected.  Final    Comment: Performed at [facility], [place] [address], [place], [place] [address]     Labs:     Basic Metabolic Panel: Recent Labs  Lab 10/22/20 1440 10/23/20 0449 10/24/20 0430 10/25/20 0725  NA 123* 126* 129* 131*  K 4.2 3.0* 3.2* 3.9  CL 88* 99 103 107  CO2 23 19* 19* 17*  GLUCOSE 106* 99 110* 116*  BUN 18 17 10  7*  CREATININE 1.32* 1.10* 1.10* 0.77  CALCIUM 8.8* 7.2* 7.7* 7.7*  MG  --   --  1.7  --    Liver Function Tests: Recent Labs  Lab 10/22/20 1440 10/23/20 0449  AST 21 19  ALT 11 10  ALKPHOS 70 50  BILITOT 0.5 0.6  PROT 10.6* 7.9  ALBUMIN 3.3* 2.6*   Recent Labs  Lab 10/22/20 1625  LIPASE 22   Recent Labs  Lab 10/22/20 2104  AMMONIA 12   CBC: Recent Labs  Lab 10/22/20 1440 10/23/20 0449  10/24/20 0430 10/25/20 0725  WBC 18.9* 16.8* 12.6* 11.0*  NEUTROABS  --  10.9*  --   --   HGB 10.9* 8.6* 8.8* 8.5*  HCT 34.5* 27.7* 28.8* 27.1*  MCV 90.3 91.7 92.9 92.5  PLT 461* 317 339 355    CBG: Recent Labs  Lab 10/22/20 1607 10/22/20 2314  GLUCAP 105* 105*     IMAGING STUDIES CT Head Wo Contrast  Result Date: 10/22/2020 CLINICAL DATA:  Delirium, sepsis EXAM: CT HEAD WITHOUT CONTRAST TECHNIQUE: Contiguous axial images were obtained from the base of the skull through the vertex without intravenous contrast. COMPARISON:  None. FINDINGS: Brain: No acute infarct or hemorrhage. Lateral ventricles and midline structures are unremarkable. There are no acute extra-axial fluid collections. There is no mass  effect. Vascular: No hyperdense vessel or unexpected calcification. Skull: Normal. Negative for fracture or focal lesion. Sinuses/Orbits: No acute finding. Other: None. IMPRESSION: 1. No acute intracranial process. Electronically Signed   By: Randa Ngo M.D.   On: 10/22/2020 18:24   CT CHEST ABDOMEN PELVIS W CONTRAST  Result Date: 10/24/2020 CLINICAL DATA:  Adenopathy. EXAM: CT CHEST, ABDOMEN, AND PELVIS WITH CONTRAST TECHNIQUE: Multidetector CT imaging of the chest, abdomen and pelvis was performed following the standard protocol during bolus administration of intravenous contrast. CONTRAST:  170mL OMNIPAQUE IOHEXOL 300 MG/ML  SOLN COMPARISON:  October 22, 2020. FINDINGS: CT CHEST FINDINGS Cardiovascular: No significant vascular findings. Normal heart size. No pericardial effusion. Mediastinum/Nodes: No enlarged mediastinal, hilar, or axillary lymph nodes. Thyroid gland, trachea, and esophagus demonstrate no significant findings. Lungs/Pleura: No pneumothorax is noted. Small left pleural effusion is noted. Bilateral reticular densities are noted throughout both lungs consistent with a history of prior multifocal pneumonia due to COVID-19. Musculoskeletal: No chest wall mass or  suspicious bone lesions identified. CT ABDOMEN PELVIS FINDINGS Hepatobiliary: No focal liver abnormality is seen. No gallstones, gallbladder wall thickening, or biliary dilatation. Pancreas: Unremarkable. No pancreatic ductal dilatation or surrounding inflammatory changes. Spleen: Normal in size without focal abnormality. Adrenals/Urinary Tract: Adrenal glands are unremarkable. Bilateral ureteral stents are noted in grossly good position. Stable mild bilateral hydronephrosis is noted. The intrarenal collecting system and right pelvis on the right remains hyperdense which is abnormal and concerning for possible hemorrhage. Stable mild inflammatory changes are seen around the urinary bladder concerning for possible cystitis. Mild perinephric stranding is noted. Stomach/Bowel: Stomach is within normal limits. Appendix appears normal. No evidence of bowel wall thickening, distention, or inflammatory changes. Vascular/Lymphatic: No significant vascular abnormality is noted. There continues remain extensive retroperitoneal and periaortic adenopathy with the largest lymph node measuring 17 mm in the left periaortic region consistent with metastatic disease. Reproductive: Uterus and bilateral adnexa are unremarkable. Other: No abdominal wall hernia or abnormality. No abdominopelvic ascites. Stable presacral stranding is noted consistent with inflammation. Musculoskeletal: No acute or significant osseous findings. IMPRESSION: 1. Bilateral reticular densities are noted throughout both lungs consistent with a history of prior multifocal pneumonia due to COVID-19. 2. Small left pleural effusion is noted. 3. Bilateral ureteral stents are noted in grossly good position. Stable mild bilateral hydronephrosis is noted. The intrarenal collecting system and right pelvis on the right remains hyperdense which is abnormal and concerning for possible hemorrhage. Stable mild inflammatory changes are seen around the urinary bladder  concerning for possible cystitis. 4. Stable extensive retroperitoneal and periaortic adenopathy is noted consistent with metastatic disease. 5. Stable presacral stranding is noted consistent with inflammation. Electronically Signed   By: Marijo Conception M.D.   On: 10/24/2020 12:06   CT BIOPSY  Result Date: 10/24/2020 INDICATION: Known primary, now with bulky retroperitoneal lymphadenopathy. Please perform CT-guided biopsy for tissue diagnostic purposes. EXAM: CT-GUIDED RETROPERITONEAL LYMPH BIOPSY COMPARISON:  CT chest, abdomen pelvis-earlier same day; CT abdomen pelvis 10/22/2020 MEDICATIONS: None. ANESTHESIA/SEDATION: Fentanyl 100 mcg IV; Versed 2 mg IV Sedation time: 12 minutes; The patient was continuously monitored during the procedure by the interventional radiology nurse under my direct supervision. CONTRAST:  None. COMPLICATIONS: None immediate. PROCEDURE: Informed consent was obtained from the patient following an explanation of the procedure, risks, benefits and alternatives. A time out was performed prior to the initiation of the procedure. The patient was positioned prone on the CT table and a limited CT was performed for procedural planning demonstrating unchanged  size and appearance of multiple pathologically enlarged retroperitoneal lymph nodes with dominant nodal conglomeration measuring approximately 2.5 x 2.0 cm (image 25 series 3). The procedure was planned. The operative site was prepped and draped in the usual sterile fashion. Appropriate trajectory was confirmed with a 22 gauge spinal needle after the adjacent tissues were anesthetized with 1% Lidocaine with epinephrine. Under intermittent CT guidance, a 17 gauge coaxial needle was advanced into the peripheral aspect of the dominant left-sided retroperitoneal nodal conglomeration. Appropriate positioning was confirmed and 6 core needle biopsy samples were obtained with an 18 gauge core needle biopsy device. The co-axial needle was removed  following administration of a Gel-Foam slurry and superficial hemostasis was achieved with manual compression. A limited postprocedural CT was negative for hemorrhage or additional complication. A dressing was placed. The patient tolerated the procedure well without immediate postprocedural complication. IMPRESSION: Technically successful CT guided core needle biopsy of dominant left-sided retroperitoneal nodal conglomeration. Electronically Signed   By: Sandi Mariscal M.D.   On: 10/24/2020 14:45   DG Chest Port 1 View  Result Date: 10/22/2020 CLINICAL DATA:  Sepsis, confusion, fever EXAM: PORTABLE CHEST 1 VIEW COMPARISON:  08/29/2020 FINDINGS: Single frontal view of the chest demonstrates an unremarkable cardiac silhouette. Diffuse interstitial prominence is again seen throughout the lungs, with near complete resolution of the bilateral airspace disease seen previously. There is minimal residual ground-glass opacity at the right lung base. No effusion or pneumothorax. No acute bony abnormalities. IMPRESSION: 1. Marked improvement in the bilateral airspace disease seen previously, compatible with improved pneumonia. 2. Diffuse interstitial prominence persists, likely postinflammatory scarring. Electronically Signed   By: Randa Ngo M.D.   On: 10/22/2020 16:57   CT Renal Stone Study  Result Date: 10/22/2020 CLINICAL DATA:  64 year old female with sepsis. Bilateral ureteral stents. Suspicion of urothelial neoplasm on September CT without and with contrast. EXAM: CT ABDOMEN AND PELVIS WITHOUT CONTRAST TECHNIQUE: Multidetector CT imaging of the abdomen and pelvis was performed following the standard protocol without IV contrast. COMPARISON:  [facility] CT Abdomen and Pelvis 09/09/2020 and earlier. FINDINGS: Lower chest: Small layering left pleural effusion is new since September. And there are increased left paraspinal and/or retrocrural soft tissue nodules (series 2, image 15) measuring up to  15 mm short axis now, versus 9-10 mm in September. No pericardial effusion. No right pleural effusion. Chronic appearing superimposed bilateral lung base disease with architectural distortion and reticular opacities. No definite acute lung base nodule. Hepatobiliary: Negative noncontrast liver and gallbladder. Pancreas: Negative. Spleen: Negative.  Stable splenule (normal variant). Adrenals/Urinary Tract: Both adrenal glands are now indistinct. Increased bilateral pararenal space soft tissue thickening and inflammatory stranding. Bilateral double-J ureteral stents remain in place and appear appropriately position. However, bilateral hydronephrosis persists and the right renal collecting system is abnormally hyperdense as before. Stranding continues along the course of both ureters to the bladder. Perivesical stranding a has not significantly changed. No discrete bladder mass. Stomach/Bowel: No dilated large or small bowel loops. There is scattered fluid in the colon. No definite acute bowel inflammation. No free air. No free fluid. Vascular/Lymphatic: Vascular patency is not evaluated in the absence of IV contrast. Normal caliber abdominal aorta. Aortoiliac calcified atherosclerosis. Progressed and now confluent bilateral Peri aortic lymphadenopathy at the level of the kidneys (series 2, image 36) with bilateral lymph node conglomeration is up to 2.5 cm diameter (previously 1.5 cm). Lymphadenopathy tracks caudally in the retroperitoneum to the iliac node stations. Reproductive: Stable, negative noncontrast appearance. Other: Stable  presacral stranding since September. No pelvic free fluid. Musculoskeletal: No acute or suspicious osseous lesion identified. IMPRESSION: 1. Progressed retroperitoneal, left paraspinal, and pelvic lymphadenopathy since September compatible with progressive metastatic nodal disease. 2. Bilateral double-J ureteral stents remain appropriately positioned, however, the bilateral renal  collecting systems remain dilated and abnormal as described in September, with underlying Urothelial Neoplasm. Persistent bilateral urinary inflammation. 3. New small layering left pleural effusion.  Chronic lung disease. 4. Aortic Atherosclerosis (ICD10-I70.0). Electronically Signed   By: Genevie Ann M.D.   On: 10/22/2020 18:31    DISCHARGE EXAMINATION: Vitals:   10/24/20 1242 10/24/20 1249 10/24/20 2120 10/25/20 0506  BP: 140/78 (!) 145/82 (!) 141/88 (!) 152/90  Pulse: 99 96 80 90  Resp: 19 18 20 20   Temp:   98 F (36.7 C) 98.2 F (36.8 C)  TempSrc:   Oral Oral  SpO2: 100% 100% 100% 99%  Weight:      Height:       General appearance: alert, cooperative, appears stated age and no distress Resp: clear to auscultation bilaterally Cardio: regular rate and rhythm, S1, S2 normal, no murmur, click, rub or gallop GI: soft, non-tender; bowel sounds normal; no masses,  no organomegaly  DISPOSITION: Home  Discharge Instructions    Call MD for:  difficulty breathing, headache or visual disturbances   Complete by: As directed    Call MD for:  extreme fatigue   Complete by: As directed    Call MD for:  persistant dizziness or light-headedness   Complete by: As directed    Call MD for:  persistant nausea and vomiting   Complete by: As directed    Call MD for:  severe uncontrolled pain   Complete by: As directed    Call MD for:  temperature >100.4   Complete by: As directed    Diet - low sodium heart healthy   Complete by: As directed    Discharge instructions   Complete by: As directed    Please be sure to follow-up with your urologist and primary care provider.  The pathology report from your recent biopsy should be available by sometime early next week.  Seek attention if your symptoms recur.  You were cared for by a hospitalist during your hospital stay. If you have any questions about your discharge medications or the care you received while you were in the hospital after you are  discharged, you can call the unit and asked to speak with the hospitalist on call if the hospitalist that took care of you is not available. Once you are discharged, your primary care physician will handle any further medical issues. Please note that NO REFILLS for any discharge medications will be authorized once you are discharged, as it is imperative that you return to your primary care physician (or establish a relationship with a primary care physician if you do not have one) for your aftercare needs so that they can reassess your need for medications and monitor your lab values. If you do not have a primary care physician, you can call [phone] for a physician referral.   Increase activity slowly   Complete by: As directed    No wound care   Complete by: As directed         Allergies as of 10/25/2020      Reactions   Metformin And Related Other (See Comments)   Lactic acidosis    Celebrex [celecoxib] Other (See Comments)   Dizziness, "makes me feel bad"  Penicillins Hives, Rash   Reports hives to penicillin and amoxicillin as an adult "a long time ago." Has tolerated cefdinir (09/2020) and cefepime (10/2020) before.      Medication List    STOP taking these medications   predniSONE 10 MG tablet Commonly known as: DELTASONE     TAKE these medications   acetaminophen 500 MG tablet Commonly known as: TYLENOL Take 1,000 mg by mouth every 8 (eight) hours as needed for moderate pain.   albuterol 108 (90 Base) MCG/ACT inhaler Commonly known as: ProAir HFA INHALE 1 TO 2 PUFFS BY MOUTH INTO THE LUNGS EVERY 4 HOURS AS NEEDED FOR WHEEZING OR SHORTNESS OF BREATH What changed:   how much to take  how to take this  when to take this  reasons to take this   amLODipine 5 MG tablet Commonly known as: NORVASC TAKE 1 TABLET BY MOUTH ONCE DAILY What changed: when to take this   ARIPiprazole 5 MG tablet Commonly known as: ABILIFY Take 5 mg by mouth at bedtime.   ascorbic acid  500 MG tablet Commonly known as: VITAMIN C Take 1,000 mg by mouth every evening.   Aspirin Low Dose 81 MG EC tablet Generic drug: aspirin TAKE 1 TABLET BY MOUTH DAILY. What changed: how much to take   buPROPion 300 MG 24 hr tablet Commonly known as: WELLBUTRIN XL Take 1 tablet (300 mg total) by mouth daily.   Bystolic 20 MG Tabs Generic drug: Nebivolol HCl TAKE 1 TABLET (20 MG TOTAL) BY MOUTH DAILY. What changed: See the new instructions.   cephALEXin 500 MG capsule Commonly known as: KEFLEX Take 1 capsule (500 mg total) by mouth every 8 (eight) hours for 7 days.   clonazePAM 0.5 MG tablet Commonly known as: KLONOPIN Take 0.5 mg by mouth at bedtime.   Co Q-10 200 MG Caps Take 200 mg by mouth every evening.   Cymbalta 30 MG capsule Generic drug: DULoxetine Take 30 mg by mouth daily.   fexofenadine 180 MG tablet Commonly known as: ALLEGRA Take 180 mg by mouth every evening.   Fish Oil 1200 MG Caps Take 1,200 mg by mouth every evening.   furosemide 20 MG tablet Commonly known as: LASIX Take 20mg  on day 1 and 2 then every other day. What changed:   how much to take  how to take this  when to take this  reasons to take this  additional instructions   gabapentin 300 MG capsule Commonly known as: Neurontin 1  qam   4   qhs What changed:   how much to take  how to take this  when to take this  additional instructions   GLUCOSAMINE-CHONDROITIN PO Take 2 tablets by mouth every evening.   guaiFENesin-dextromethorphan 100-10 MG/5ML syrup Commonly known as: ROBITUSSIN DM Take 10 mLs by mouth every 4 (four) hours as needed for cough.   omeprazole 20 MG capsule Commonly known as: PRILOSEC Take 1 capsule (20 mg total) by mouth daily. What changed: when to take this   ondansetron 4 MG disintegrating tablet Commonly known as: ZOFRAN-ODT Take 1 tablet (4 mg total) by mouth every 8 (eight) hours as needed for nausea or vomiting.   simvastatin 20 MG  tablet Commonly known as: ZOCOR TAKE 1 TABLET (20 MG TOTAL) BY MOUTH DAILY. What changed: See the new instructions.   sodium bicarbonate 650 MG tablet Take 1 tablet (650 mg total) by mouth 2 (two) times daily for 10 days.   tamsulosin 0.4 MG  Caps capsule Commonly known as: FLOMAX Take 0.4 mg by mouth at bedtime.   thiamine 100 MG tablet Take 1 tablet (100 mg total) by mouth daily. Start taking on: October 26, 2020   vitamin B-12 1000 MCG tablet Commonly known as: CYANOCOBALAMIN Take 2,000 mcg by mouth every evening.   Vitamin D 125 MCG (5000 UT) Caps Take 5,000 Units by mouth every evening.   Vitamin E 180 MG (400 UNIT) Caps Take 400 Units by mouth every evening.         Follow-up Information    [name], DO. Schedule an appointment as soon as possible for a visit in 1 week(s).   Specialty: Osteopathic Medicine Contact information: [address] [place] [address] [phone]        Robley Fries, MD. Schedule an appointment as soon as possible for a visit in 2 week(s).   Specialty: Urology Contact information: [place] [address] [phone]               TOTAL DISCHARGE TIME: 70 minutes  [place]  [facility] Pager on [email or web address]  10/25/2020, 4:25 PM

## 2020-10-25 NOTE — TOC Progression Note (Signed)
Transition of Care [facility]) - Progression Note    Patient Details  Name: Mary Stark MRN: [id number] Date of Birth: 12-29-1955  Transition of Care [facility]) CM/SW Contact  Purcell Mouton, RN Phone Number: 10/25/2020, 3:18 PM  Clinical Narrative:     Spoke with pt and husband at bedside concerning discharge needs. At present time pt and husband states there are no  Needs. Explained to pt that [facility] asked if pt need anything. Pt stated again, no yes, they follow me at home.   Expected Discharge Plan: Home/Self Care Barriers to Discharge: No Barriers Identified  Expected Discharge Plan and Services Expected Discharge Plan: Home/Self Care       Living arrangements for the past 2 months: Single Family Home Expected Discharge Date: 10/25/20                                     Social Determinants of Health (SDOH) Interventions    Readmission Risk Interventions No flowsheet data found.

## 2020-10-25 NOTE — Plan of Care (Signed)

## 2020-10-25 NOTE — Progress Notes (Signed)
Urology Inpatient Progress Report  Sepsis secondary to UTI ([place]) [A41.9, N39.0]        Intv/Subj: No acute events overnight. Patient is without complaint.  She underwent CT guided biopsy of retroperitoneal LAD yesterday.   Principal Problem:   Sepsis secondary to UTI [facility]) Active Problems:   Dyslipidemia, goal LDL below 100   Depression with anxiety   History of alcohol dependence (HCC)   Tobacco abuse   Essential hypertension   Hyponatremia   Hydronephrosis   Normocytic anemia   Hyperproteinemia  Current Facility-Administered Medications  Medication Dose Route Frequency Provider Last Rate Last Admin  . 0.9 % NaCl with KCl 20 mEq/ L  infusion   Intravenous Continuous Patrecia Pour, MD 100 mL/hr at 10/25/20 0106 New Bag at 10/25/20 0106  . acetaminophen (TYLENOL) tablet 650 mg  650 mg Oral Q6H PRN Reubin Milan, MD   650 mg at 10/24/20 2130   Or  . acetaminophen (TYLENOL) suppository 650 mg  650 mg Rectal Q6H PRN Reubin Milan, MD      . amLODipine [facility]) tablet 5 mg  5 mg Oral QPM Reubin Milan, MD   5 mg at 10/24/20 1709  . ARIPiprazole (ABILIFY) tablet 5 mg  5 mg Oral QHS Reubin Milan, MD   5 mg at 10/24/20 2122  . ceFEPIme (MAXIPIME) 2 g in sodium chloride 0.9 % 100 mL IVPB  2 g Intravenous Q12H Polly Cobia, RPH 200 mL/hr at 10/24/20 2127 2 g at 10/24/20 2127  . clonazePAM (KLONOPIN) tablet 0.5 mg  0.5 mg Oral QHS Reubin Milan, MD   0.5 mg at 10/24/20 2122  . enoxaparin (LOVENOX) injection 40 mg  40 mg Subcutaneous Q24H Reubin Milan, MD   40 mg at 10/23/20 2156  . feeding supplement (ENSURE ENLIVE / ENSURE PLUS) liquid 237 mL  237 mL Oral BID BM Patrecia Pour, MD   237 mL at 10/24/20 2122  . multivitamin with minerals tablet 1 tablet  1 tablet Oral Daily Patrecia Pour, MD   1 tablet at 10/24/20 1026  . ondansetron (ZOFRAN) tablet 4 mg  4 mg Oral Q6H PRN Reubin Milan, MD       Or  . ondansetron [facility]) injection 4 mg  4  mg Intravenous Q6H PRN Reubin Milan, MD      . pantoprazole (PROTONIX) EC tablet 40 mg  40 mg Oral Daily Reubin Milan, MD   40 mg at 10/24/20 1027  . simvastatin (ZOCOR) tablet 20 mg  20 mg Oral q1800 Reubin Milan, MD   20 mg at 10/24/20 1709  . tamsulosin (FLOMAX) capsule 0.4 mg  0.4 mg Oral QHS Reubin Milan, MD   0.4 mg at 10/24/20 2122  . thiamine tablet 100 mg  100 mg Oral Daily Bonnielee Haff, MD         Objective: Vital: Vitals:   10/24/20 1242 10/24/20 1249 10/24/20 2120 10/25/20 0506  BP: 140/78 (!) 145/82 (!) 141/88 (!) 152/90  Pulse: 99 96 80 90  Resp: 19 18 20 20   Temp:   98 F (36.7 C) 98.2 F (36.8 C)  TempSrc:   Oral Oral  SpO2: 100% 100% 100% 99%  Weight:      Height:       I/Os: I/O last 3 completed shifts: In: 9147 [P.O.:355; I.V.:1100; IV Piggyback:100] Out: -   Physical Exam:  General: Patient is in no apparent distress Lungs: Normal respiratory  effort, chest expands symmetrically. GI: The abdomen is soft and nontender  GU: No CVA tenderness b/l Ext: lower extremities symmetric  Lab Results: Recent Labs    10/22/20 1440 10/23/20 0449 10/24/20 0430  WBC 18.9* 16.8* 12.6*  HGB 10.9* 8.6* 8.8*  HCT 34.5* 27.7* 28.8*   Recent Labs    10/22/20 1440 10/23/20 0449 10/24/20 0430  NA 123* 126* 129*  K 4.2 3.0* 3.2*  CL 88* 99 103  CO2 23 19* 19*  GLUCOSE 106* 99 110*  BUN 18 17 10   CREATININE 1.32* 1.10* 1.10*  CALCIUM 8.8* 7.2* 7.7*   Recent Labs    10/22/20 1625  INR 1.2   No results for input(s): LABURIN in the last 72 hours. Results for orders placed or performed during the hospital encounter of 10/22/20  Blood Culture (routine x 2)     Status: None (Preliminary result)   Collection Time: 10/22/20  4:25 PM   Specimen: BLOOD LEFT FOREARM  Result Value Ref Range Status   Specimen Description   Final    BLOOD LEFT FOREARM Performed at [facility] [address] [place], [place]  [address]    Special Requests   Final    BOTTLES DRAWN AEROBIC AND ANAEROBIC Blood Culture adequate volume Performed at [facility] [address], [place], [place] [address]    Culture   Final    NO GROWTH 2 DAYS Performed at [facility] [address] [place], [place] [address]    Report Status PENDING  Incomplete  Blood Culture (routine x 2)     Status: None (Preliminary result)   Collection Time: 10/22/20  4:25 PM   Specimen: BLOOD LEFT HAND  Result Value Ref Range Status   Specimen Description   Final    BLOOD LEFT HAND Performed at [facility] [address], [place], [place] [address]    Special Requests   Final    BOTTLES DRAWN AEROBIC AND ANAEROBIC Blood Culture results may not be optimal due to an inadequate volume of blood received in culture bottles Performed at [facility] [address], [place], [place] [address]    Culture   Final    NO GROWTH 2 DAYS Performed at [facility] [address] [place], [place] [address]    Report Status PENDING  Incomplete  Urine culture     Status: None   Collection Time: 10/22/20  4:57 PM   Specimen: In/Out Cath Urine  Result Value Ref Range Status   Specimen Description   Final    IN/OUT CATH URINE Performed at [facility] [address], [place], [place] [address]    Special Requests   Final    NONE Performed at [facility], [place] [address] [place],  [address]    Culture   Final    NO GROWTH Performed at [facility], [place] [address] [place],  [address]    Report Status 10/24/2020 FINAL  Final  Respiratory Panel by RT PCR (Flu A&B, Covid) - Nasopharyngeal Swab     Status: None   Collection Time: 10/22/20  6:40 PM   Specimen: Nasopharyngeal Swab  Result Value Ref Range Status   SARS Coronavirus 2 by RT PCR NEGATIVE NEGATIVE Final    Comment: (NOTE) SARS-CoV-2 target nucleic acids  are NOT DETECTED.  The SARS-CoV-2 RNA is generally detectable in upper respiratoy specimens during the acute phase  of infection. The lowest concentration of SARS-CoV-2 viral copies this assay can detect is 131 copies/mL. A negative result does not preclude SARS-Cov-2 infection and should not be used as the sole basis for treatment or other patient management decisions. A negative result may occur with  improper specimen collection/handling, submission of specimen other than nasopharyngeal swab, presence of viral mutation(s) within the areas targeted by this assay, and inadequate number of viral copies (<131 copies/mL). A negative result must be combined with clinical observations, patient history, and epidemiological information. The expected result is Negative.  Fact Sheet for Patients:  PinkCheek.be  Fact Sheet for Healthcare Providers:  GravelBags.it  This test is no t yet approved or cleared by the [place] FDA and  has been authorized for detection and/or diagnosis of SARS-CoV-2 by FDA under an Emergency Use Authorization (EUA). This EUA will remain  in effect (meaning this test can be used) for the duration of the COVID-19 declaration under Section 564(b)(1) of the Act, 21 U.S.C. section 360bbb-3(b)(1), unless the authorization is terminated or revoked sooner.     Influenza A by PCR NEGATIVE NEGATIVE Final   Influenza B by PCR NEGATIVE NEGATIVE Final    Comment: (NOTE) The Xpert Xpress SARS-CoV-2/FLU/RSV assay is intended as an aid in  the diagnosis of influenza from Nasopharyngeal swab specimens and  should not be used as a sole basis for treatment. Nasal washings and  aspirates are unacceptable for Xpert Xpress SARS-CoV-2/FLU/RSV  testing.  Fact Sheet for Patients: PinkCheek.be  Fact Sheet for Healthcare Providers: GravelBags.it  This test is not  yet approved or cleared by the [place] FDA and  has been authorized for detection and/or diagnosis of SARS-CoV-2 by  FDA under an Emergency Use Authorization (EUA). This EUA will remain  in effect (meaning this test can be used) for the duration of the  Covid-19 declaration under Section 564(b)(1) of the Act, 21  U.S.C. section 360bbb-3(b)(1), unless the authorization is  terminated or revoked. Performed at [facility], [place] [address] [place], [place] [address]   C Difficile Quick Screen w PCR reflex     Status: None   Collection Time: 10/23/20  5:46 PM   Specimen: STOOL  Result Value Ref Range Status   C Diff antigen NEGATIVE NEGATIVE Final   C Diff toxin NEGATIVE NEGATIVE Final   C Diff interpretation No C. difficile detected.  Final    Comment: Performed at [facility], [place] [address], [place], [place] [address]    Studies/Results: CT CHEST ABDOMEN PELVIS W CONTRAST  Result Date: 10/24/2020 CLINICAL DATA:  Adenopathy. EXAM: CT CHEST, ABDOMEN, AND PELVIS WITH CONTRAST TECHNIQUE: Multidetector CT imaging of the chest, abdomen and pelvis was performed following the standard protocol during bolus administration of intravenous contrast. CONTRAST:  156mL OMNIPAQUE IOHEXOL 300 MG/ML  SOLN COMPARISON:  October 22, 2020. FINDINGS: CT CHEST FINDINGS Cardiovascular: No significant vascular findings. Normal heart size. No pericardial effusion. Mediastinum/Nodes: No enlarged mediastinal, hilar, or axillary lymph nodes. Thyroid gland, trachea, and esophagus demonstrate no significant findings. Lungs/Pleura: No pneumothorax is noted. Small left pleural effusion is noted. Bilateral reticular densities are noted throughout both lungs consistent with a history of prior multifocal pneumonia due to COVID-19. Musculoskeletal: No chest wall mass or suspicious bone lesions identified. CT ABDOMEN PELVIS FINDINGS Hepatobiliary: No focal liver abnormality is seen. No  gallstones, gallbladder wall thickening, or biliary dilatation. Pancreas: Unremarkable. No pancreatic ductal dilatation or surrounding inflammatory changes. Spleen: Normal in size without  focal abnormality. Adrenals/Urinary Tract: Adrenal glands are unremarkable. Bilateral ureteral stents are noted in grossly good position. Stable mild bilateral hydronephrosis is noted. The intrarenal collecting system and right pelvis on the right remains hyperdense which is abnormal and concerning for possible hemorrhage. Stable mild inflammatory changes are seen around the urinary bladder concerning for possible cystitis. Mild perinephric stranding is noted. Stomach/Bowel: Stomach is within normal limits. Appendix appears normal. No evidence of bowel wall thickening, distention, or inflammatory changes. Vascular/Lymphatic: No significant vascular abnormality is noted. There continues remain extensive retroperitoneal and periaortic adenopathy with the largest lymph node measuring 17 mm in the left periaortic region consistent with metastatic disease. Reproductive: Uterus and bilateral adnexa are unremarkable. Other: No abdominal wall hernia or abnormality. No abdominopelvic ascites. Stable presacral stranding is noted consistent with inflammation. Musculoskeletal: No acute or significant osseous findings. IMPRESSION: 1. Bilateral reticular densities are noted throughout both lungs consistent with a history of prior multifocal pneumonia due to COVID-19. 2. Small left pleural effusion is noted. 3. Bilateral ureteral stents are noted in grossly good position. Stable mild bilateral hydronephrosis is noted. The intrarenal collecting system and right pelvis on the right remains hyperdense which is abnormal and concerning for possible hemorrhage. Stable mild inflammatory changes are seen around the urinary bladder concerning for possible cystitis. 4. Stable extensive retroperitoneal and periaortic adenopathy is noted consistent with  metastatic disease. 5. Stable presacral stranding is noted consistent with inflammation. Electronically Signed   By: Marijo Conception M.D.   On: 10/24/2020 12:06   CT BIOPSY  Result Date: 10/24/2020 INDICATION: Known primary, now with bulky retroperitoneal lymphadenopathy. Please perform CT-guided biopsy for tissue diagnostic purposes. EXAM: CT-GUIDED RETROPERITONEAL LYMPH BIOPSY COMPARISON:  CT chest, abdomen pelvis-earlier same day; CT abdomen pelvis 10/22/2020 MEDICATIONS: None. ANESTHESIA/SEDATION: Fentanyl 100 mcg IV; Versed 2 mg IV Sedation time: 12 minutes; The patient was continuously monitored during the procedure by the interventional radiology nurse under my direct supervision. CONTRAST:  None. COMPLICATIONS: None immediate. PROCEDURE: Informed consent was obtained from the patient following an explanation of the procedure, risks, benefits and alternatives. A time out was performed prior to the initiation of the procedure. The patient was positioned prone on the CT table and a limited CT was performed for procedural planning demonstrating unchanged size and appearance of multiple pathologically enlarged retroperitoneal lymph nodes with dominant nodal conglomeration measuring approximately 2.5 x 2.0 cm (image 25 series 3). The procedure was planned. The operative site was prepped and draped in the usual sterile fashion. Appropriate trajectory was confirmed with a 22 gauge spinal needle after the adjacent tissues were anesthetized with 1% Lidocaine with epinephrine. Under intermittent CT guidance, a 17 gauge coaxial needle was advanced into the peripheral aspect of the dominant left-sided retroperitoneal nodal conglomeration. Appropriate positioning was confirmed and 6 core needle biopsy samples were obtained with an 18 gauge core needle biopsy device. The co-axial needle was removed following administration of a Gel-Foam slurry and superficial hemostasis was achieved with manual compression. A limited  postprocedural CT was negative for hemorrhage or additional complication. A dressing was placed. The patient tolerated the procedure well without immediate postprocedural complication. IMPRESSION: Technically successful CT guided core needle biopsy of dominant left-sided retroperitoneal nodal conglomeration. Electronically Signed   By: Sandi Mariscal M.D.   On: 10/24/2020 14:45    Assessment: 64 yo woman with progressive retroperitoneal LAD and b/l indwelling stents to manage b/l hydronephrosis now with left pyelonephritis.  She has undergone b/l ureteroscopy x 2 without findings of  mass in collecting system/ureters/bladder.  She was treated with steroid course for possible retroperitoneal fibrosis.  Repeat imaging showed progressive retroperitoneal LAD.  She has a IR guided biopsy of this LAD scheduled for Friday, 11/12.    Plan: 1. Left pyelonephritis: urine culture negative, can treat empirically based on imaging that is consistent with pyelonephritis 2. B/l hydro: stable, needs to continue indwelling ureteral stents which are in good position 3. Retroperitoneal LAD: PPD1 CT guided biopsy by IR, pathology pending  Jacalyn Lefevre, MD Urology 10/25/2020, 6:45 AM

## 2020-10-27 LAB — CULTURE, BLOOD (ROUTINE X 2)
Culture: NO GROWTH
Culture: NO GROWTH
Special Requests: ADEQUATE

## 2020-10-28 ENCOUNTER — Other Ambulatory Visit: Payer: Self-pay | Admitting: *Deleted

## 2020-10-28 ENCOUNTER — Encounter: Payer: Self-pay | Admitting: *Deleted

## 2020-10-28 MED FILL — ARIPiprazole 5 MG TABS: 5 | 30 days supply | Qty: 30 | Fill #2

## 2020-10-28 MED FILL — GABAPENTIN 300 MG CAPSULE: 300 | 30 days supply | Qty: 180 | Fill #2

## 2020-10-28 NOTE — Patient Outreach (Signed)
[facility]) Care Management  10/28/2020  DRUANNE BOSQUES [birth year] [id number]   Transition of care call  Referral received:10/23/20 Initial outreach:10/28/20 Insurance: UMR   Subjective: Initial successful telephone call to patient's preferred number in order to complete transition of care assessment; 2 HIPAA identifiers verified. Explained purpose of call and completed transition of care assessment.  Kacie states that she is feeling tired. She denies having fever, reports frequent urination that she reports that she has had since ureteral stent being in place, she report urine being clear.She report  tolerating diet, drinking fluids well. Lavra discussed awaiting result of biopsy obtained during admission, checking her my chart frequently. She discussed continuing wearing oxygen at 2 liters since Covid 19 in September, stating she does well around home, but she is encounters any type of incline she gets winded and states having oxygen when out. She reports having soreness at left hip and bruising at site since recent fall. She reports having some soreness in chest when coughing, and using incentive spirometry during the day up 1700 ml. Daughter discussed being unsure when she will be able to return to nursing.   Spouse/children are assisting with her  recovery.   Reviewed accessing the following [place] Benefits : Discussed ongoing health issues of hypertension, hyperlipidemia and says that she is enrolled in  of the [place] chronic disease management programs.  She does not have the hospital indemnity He uses a [facility] outpatient pharmacy at [facility].      Objective:  Geniece Akers  was hospitalized at [facility]  11/9-11/12/21  for Sepsis due to Acute Pyelonephritis,encephalopathy, Retroperitoneal lymphadenopathy biopsy .Past medical history significant for : Hypertension, dyslipidemia, Covid 19, 08/19/20 on home oxygen, stage 3 CKD,  bilateral hydronephrosis, ureteral stents.She  was discharged to home on 10/25/20 without the need for home health services or DME.   Assessment:  Patient voices good understanding of all discharge instructions.  See transition of care flowsheet for assessment details.   Plan:  Reviewed hospital discharge diagnosis of  Severe sepsis, Acute Pyleonephritis, Retroperitoneal lymphadenopathy  and discharge treatment plan using hospital discharge instructions, assessing medication adherence, reviewing problems requiring provider notification, and discussing the importance of follow up with surgeon, primary care provider and/or specialists as directed.  Reviewed [place] healthy lifestyle program information to receive discounted premium for  2023   Step 1: Get  your annual physical  Step 2: Complete your health assessment  Step 3:Identify your current health status and complete the corresponding action step between January 1, and August 14, 2021.    Using [place] website, verified that patient is an active participate in [facility] [employer] chronic disease management program.    Patient agreeable to return  visit on this week  to follow up on arranging post discharge PCP and urology visit,that she agrees to call and schedule and will assess for ongoing care coordination needs.  Routed successful outreach letter with [facility] Management pamphlet and 24 Hour Nurse Line Magnet to [facility] Management clinical pool to be mailed to patient's home address.    Joylene Draft, RN, BSN  [facility] Management Coordinator  [phone]- Mobile [phone]- Toll Free Main Office

## 2020-10-29 ENCOUNTER — Other Ambulatory Visit: Payer: Self-pay | Admitting: *Deleted

## 2020-10-29 ENCOUNTER — Other Ambulatory Visit: Payer: Self-pay | Admitting: Radiology

## 2020-10-29 LAB — SURGICAL PATHOLOGY

## 2020-10-29 NOTE — Patient Outreach (Signed)
[facility]) Care Management  10/29/2020  JACKILYN UMPHLETT September 18, 1956 [id number]   Red on EMMI Alert  Day: 1 Date:10/27/20  Red Alert Reason. Scheduled follow up ? NO   Outreach attempt#1- late entry for 10/28/20 Addressed at transition of care call on 10/28/20, Scheduled follow up?, NO patient states she plans to contact PCP for post discharge visit she verbalized discharge instructions for follow up with PCP in 1 week and Urology in 2 weeks. She discussed awaiting communication from urology regarding results from biopsy obtained during hospitalization. She declined assistance with scheduling post discharge visit.   Plan Will follow up in the 4 days for Transition of care  regarding scheduling post discharge visits, patient agreeable.   Joylene Draft, RN, BSN  [facility] Management Coordinator  [phone]- Mobile [phone]- Toll Free Main Office

## 2020-10-30 ENCOUNTER — Telehealth: Payer: Self-pay | Admitting: Oncology

## 2020-10-30 ENCOUNTER — Encounter: Payer: Self-pay | Admitting: Osteopathic Medicine

## 2020-10-30 ENCOUNTER — Ambulatory Visit (INDEPENDENT_AMBULATORY_CARE_PROVIDER_SITE_OTHER): Payer: No Typology Code available for payment source | Admitting: Osteopathic Medicine

## 2020-10-30 ENCOUNTER — Ambulatory Visit (HOSPITAL_COMMUNITY): Admission: RE | Admit: 2020-10-30 | Payer: No Typology Code available for payment source | Source: Ambulatory Visit

## 2020-10-30 ENCOUNTER — Encounter (HOSPITAL_COMMUNITY): Payer: Self-pay

## 2020-10-30 VITALS — BP 141/70 | HR 63 | Temp 98.7°F | Wt 188.1 lb

## 2020-10-30 DIAGNOSIS — C801 Malignant (primary) neoplasm, unspecified: Secondary | ICD-10-CM

## 2020-10-30 DIAGNOSIS — R0781 Pleurodynia: Secondary | ICD-10-CM

## 2020-10-30 DIAGNOSIS — F418 Other specified anxiety disorders: Secondary | ICD-10-CM

## 2020-10-30 DIAGNOSIS — D649 Anemia, unspecified: Secondary | ICD-10-CM | POA: Diagnosis not present

## 2020-10-30 DIAGNOSIS — N390 Urinary tract infection, site not specified: Secondary | ICD-10-CM

## 2020-10-30 MED ORDER — ARIPIPRAZOLE 5 MG PO TABS
10.0000 mg | ORAL_TABLET | Freq: Every day | ORAL | Status: DC
Start: 2020-10-30 — End: 2020-11-28

## 2020-10-30 MED ORDER — OXYCODONE-ACETAMINOPHEN 5-325 MG PO TABS
1.0000 | ORAL_TABLET | Freq: Four times a day (QID) | ORAL | 0 refills | Status: DC | PRN
Start: 2020-10-30 — End: 2020-11-27

## 2020-10-30 MED ORDER — CLONAZEPAM 0.5 MG PO TABS
0.5000 mg | ORAL_TABLET | Freq: Every day | ORAL | 2 refills | Status: DC
Start: 2020-10-30 — End: 2020-11-19

## 2020-10-30 MED ORDER — NITROFURANTOIN MACROCRYSTAL 100 MG PO CAPS: 100.0000 mg | ORAL_CAPSULE | Freq: Every day | ORAL | 1 refills | Status: DC

## 2020-10-30 NOTE — Progress Notes (Signed)
Mary Stark is a 64 y.o. female who presents to  [place] at [facility]  today, 10/30/20, seeking care for the following:  . Follow-up from recent hospitalization, sepsis due to pyelonephritis.  Patient is feeling improved after discharge from hospital, still noting significant tiredness and left-sided rib pain due to fall.  Unfortunately, biopsy of retroperitoneal lymphadenopathy has shown concern for urologic/gynecologic malignancy, she has an upcoming appointment with oncology and is following with urology as well.  She states overall she is coping fairly well but mental health of course is "probably not the best"     ASSESSMENT & PLAN with other pertinent findings:  The primary encounter diagnosis was Recurrent UTI. Diagnoses of Depression with anxiety, Malignancy ([place]), Anemia, unspecified type, and Rib pain on left side were also pertinent to this visit.   No results found for this or any previous visit (from the past 24 hour(s)).  --> Recurrent UTI with recent hospitalization.  Unfortunately, cultures have not been particularly revealing, but last positive culture did show sensitivity to nitrofurantoin.  Will place on suppressive antibiotic therapy for next 6 months unless urology/oncology says otherwise --> Mental health, will trial increase Abilify from 5 mg to 10 mg, recheck in 2 weeks --> Limited prescription for pain medications due to left rib pain.   There are no Patient Instructions on file for this visit.  No orders of the defined types were placed in this encounter.   Meds ordered this encounter  Medications  . nitrofurantoin (MACRODANTIN) 100 MG capsule    Sig: Take 1 capsule (100 mg total) by mouth at bedtime.    Dispense:  90 capsule    Refill:  1  . clonazePAM (KLONOPIN) 0.5 MG tablet    Sig: Take 1 tablet (0.5 mg total) by mouth at bedtime.    Dispense:  30 tablet    Refill:  2  . ARIPiprazole (ABILIFY) 5 MG  tablet    Sig: Take 2 tablets (10 mg total) by mouth at bedtime.    Dispense:  90 tablet  . oxyCODONE-acetaminophen (PERCOCET/ROXICET) 5-325 MG tablet    Sig: Take 1-2 tablets by mouth every 6 (six) hours as needed for moderate pain or severe pain.    Dispense:  30 tablet    Refill:  0       Follow-up instructions: Return in about 2 weeks (around 11/13/2020) for VIRTUAL VISIT, RECHECK ON INCREASED ABILIFY .                                         BP (!) 141/70 (BP Location: Left Arm, Patient Position: Sitting, Cuff Size: Normal)   Pulse 63   Temp 98.7 F (37.1 C) (Oral)   Wt 188 lb 1.9 oz (85.3 kg)   SpO2 100%   BMI 30.36 kg/m   Current Meds  Medication Sig  . acetaminophen (TYLENOL) 500 MG tablet Take 1,000 mg by mouth every 8 (eight) hours as needed for moderate pain.   Marland Kitchen albuterol (PROAIR HFA) 108 (90 Base) MCG/ACT inhaler INHALE 1 TO 2 PUFFS BY MOUTH INTO THE LUNGS EVERY 4 HOURS AS NEEDED FOR WHEEZING OR SHORTNESS OF BREATH (Patient taking differently: Inhale 1-2 puffs into the lungs every 4 (four) hours as needed for wheezing or shortness of breath. INHALE 1 TO 2 PUFFS BY MOUTH INTO THE LUNGS EVERY 4 HOURS AS  NEEDED FOR WHEEZING OR SHORTNESS OF BREATH)  . amLODipine (NORVASC) 5 MG tablet TAKE 1 TABLET BY MOUTH ONCE DAILY (Patient taking differently: Take 5 mg by mouth every evening. )  . ARIPiprazole (ABILIFY) 5 MG tablet Take 2 tablets (10 mg total) by mouth at bedtime.  Marland Kitchen ascorbic acid (VITAMIN C) 500 MG tablet Take 1,000 mg by mouth every evening.   . ASPIRIN LOW DOSE 81 MG EC tablet TAKE 1 TABLET BY MOUTH DAILY. (Patient taking differently: Take 81 mg by mouth daily. )  . buPROPion (WELLBUTRIN XL) 300 MG 24 hr tablet Take 1 tablet (300 mg total) by mouth daily.  Marland Kitchen BYSTOLIC 20 MG TABS TAKE 1 TABLET (20 MG TOTAL) BY MOUTH DAILY. (Patient taking differently: Take 20 mg by mouth daily. )  . cephALEXin (KEFLEX) 500 MG capsule Take 1 capsule  (500 mg total) by mouth every 8 (eight) hours for 7 days.  . Cholecalciferol (VITAMIN D) 125 MCG (5000 UT) CAPS Take 5,000 Units by mouth every evening.   . clonazePAM (KLONOPIN) 0.5 MG tablet Take 1 tablet (0.5 mg total) by mouth at bedtime.  . Coenzyme Q10 (CO Q-10) 200 MG CAPS Take 200 mg by mouth every evening.   . DULoxetine (CYMBALTA) 30 MG capsule Take 30 mg by mouth daily.   . fexofenadine (ALLEGRA) 180 MG tablet Take 180 mg by mouth every evening.   . furosemide (LASIX) 20 MG tablet Take 20mg  on day 1 and 2 then every other day. (Patient taking differently: Take 20 mg by mouth as needed for edema. )  . gabapentin (NEURONTIN) 300 MG capsule 1  qam   4   qhs (Patient taking differently: Take 300-1,200 mg by mouth See admin instructions. Take 300 mg by mouth in the morning and afternoon and 1200 mg at night)  . GLUCOSAMINE-CHONDROITIN PO Take 2 tablets by mouth every evening.   Marland Kitchen guaiFENesin-dextromethorphan (ROBITUSSIN DM) 100-10 MG/5ML syrup Take 10 mLs by mouth every 4 (four) hours as needed for cough.  . Omega-3 Fatty Acids (FISH OIL) 1200 MG CAPS Take 1,200 mg by mouth every evening.   Marland Kitchen omeprazole (PRILOSEC) 20 MG capsule Take 1 capsule (20 mg total) by mouth daily. (Patient taking differently: Take 20 mg by mouth every evening. )  . ondansetron (ZOFRAN-ODT) 4 MG disintegrating tablet Take 1 tablet (4 mg total) by mouth every 8 (eight) hours as needed for nausea or vomiting.  . simvastatin (ZOCOR) 20 MG tablet TAKE 1 TABLET (20 MG TOTAL) BY MOUTH DAILY. (Patient taking differently: Take 20 mg by mouth daily at 6 PM. )  . sodium bicarbonate 650 MG tablet Take 1 tablet (650 mg total) by mouth 2 (two) times daily for 10 days.  . tamsulosin (FLOMAX) 0.4 MG CAPS capsule Take 0.4 mg by mouth at bedtime.  . thiamine 100 MG tablet Take 1 tablet (100 mg total) by mouth daily.  . vitamin B-12 (CYANOCOBALAMIN) 1000 MCG tablet Take 2,000 mcg by mouth every evening.   . Vitamin E 180 MG (400 UNIT)  CAPS Take 400 Units by mouth every evening.   . [DISCONTINUED] ARIPiprazole (ABILIFY) 5 MG tablet Take 5 mg by mouth at bedtime.   . [DISCONTINUED] clonazePAM (KLONOPIN) 0.5 MG tablet Take 0.5 mg by mouth at bedtime.     No results found for this or any previous visit (from the past 72 hour(s)).  No results found.     All questions at time of visit were answered - patient instructed to  contact office with any additional concerns or updates.  ER/RTC precautions were reviewed with the patient as applicable.   Please note: voice recognition software was used to produce this document, and typos may escape review. Please contact Dr. Sheppard Coil for any needed clarifications.

## 2020-10-30 NOTE — Telephone Encounter (Signed)
Received a new pt referral from Dr. Claudia Desanctis at [facility] for metastatic cancer. Ms. Bessey has been cld and scheduled to see Dr. Alen Blew on 11/24 at 2pm. Pt aware to arrive 30 minutes early.

## 2020-10-31 ENCOUNTER — Encounter: Payer: Self-pay | Admitting: Osteopathic Medicine

## 2020-10-31 DIAGNOSIS — C801 Malignant (primary) neoplasm, unspecified: Secondary | ICD-10-CM | POA: Insufficient documentation

## 2020-10-31 DIAGNOSIS — U099 Post covid-19 condition, unspecified: Secondary | ICD-10-CM | POA: Insufficient documentation

## 2020-10-31 HISTORY — DX: Post covid-19 condition, unspecified: U09.9

## 2020-11-01 ENCOUNTER — Other Ambulatory Visit: Payer: Self-pay | Admitting: *Deleted

## 2020-11-01 NOTE — Telephone Encounter (Signed)
Task completed. Form faxed to [facility] at [phone]. Confirmation rec'd. Copy of the form placed in scan folder.

## 2020-11-01 NOTE — Patient Outreach (Signed)
[facility]) Care Management  11/01/2020  Mary Stark 1956-05-20 [id number]   Transition of care follow up call/case closure  Referral received:10/23/20 Initial outreach:10/28/20 Insurance: [employer]    Subjective Successful follow up call to patient , she discussed doing okay just tires out easily.  [name] discussed getting result of recent lymphadenopathy  biopsy with shows concern for urologic/gynecologis malignancy. She discussed having appointment with oncology on next week. She states that she is not as sad about it as thought, reports PCP has recommended increase in her Abilify. Reviewed [employer] benefit for [employer], employee assistance counseling program reports being familiar with this. She reports having a psychologist Dr. Kenton Kingfisher that she follows up with and may schedule a sooner visit.    Objective  [name] was hospitalized [facility]  11/9-11/12/21  for Sepsis due to Acute Pyelonephritis,encephalopathy, Retroperitoneal lymphadenopathy biopsy .Past medical history significant for : Hypertension, dyslipidemia, Covid 19, 08/19/20 on home oxygen, stage 3 CKD, bilateral hydronephrosis, ureteral stents.She  was discharged to home on 10/25/20 without the need for home health servicesor DME.   Plan No ongoing care management needs identified ,will plan case closure patient in agreement.    Joylene Draft, RN, BSN  [facility] Management Coordinator  [phone]- Mobile [phone]- Toll Free Main Office

## 2020-11-04 MED FILL — buPROPion HCL ER (XL) 300 M: 300 | 30 days supply | Qty: 30 | Fill #0

## 2020-11-06 ENCOUNTER — Inpatient Hospital Stay: Payer: No Typology Code available for payment source | Attending: Oncology | Admitting: Oncology

## 2020-11-06 ENCOUNTER — Other Ambulatory Visit: Payer: Self-pay

## 2020-11-06 VITALS — BP 156/83 | HR 73 | Temp 97.0°F | Resp 18 | Ht 66.0 in | Wt 191.0 lb

## 2020-11-06 DIAGNOSIS — C669 Malignant neoplasm of unspecified ureter: Secondary | ICD-10-CM

## 2020-11-06 DIAGNOSIS — C801 Malignant (primary) neoplasm, unspecified: Secondary | ICD-10-CM | POA: Diagnosis not present

## 2020-11-06 MED ORDER — PROCHLORPERAZINE MALEATE 10 MG PO TABS: 10.0000 mg | ORAL_TABLET | Freq: Four times a day (QID) | ORAL | 0 refills | Status: DC | PRN

## 2020-11-06 MED ORDER — LIDOCAINE-PRILOCAINE 2.5-2.5 % EX CREA: 1.0000 "application " | TOPICAL_CREAM | CUTANEOUS | 0 refills | Status: DC | PRN

## 2020-11-06 NOTE — Progress Notes (Signed)
START ON PATHWAY REGIMEN - Bladder     A cycle is every 21 days:     Carboplatin      Gemcitabine   **Always confirm dose/schedule in your pharmacy ordering system**  Patient Characteristics: Advanced/Metastatic Disease, First Line, No Prior Platinum-Based Therapy, Poor Renal Function (CrCl < 50 mL/min), Unknown PD-L1 Expression Therapeutic Status: Advanced/Metastatic Disease Line of Therapy: First Line Prior Platinum-Based Therapy<= No Renal Function: Poor Renal Function (CrCl < 50 mL/min) PD-L1 Expression Status: Unknown PD-L1 Expression Intent of Therapy: Non-Curative / Palliative Intent, Discussed with Patient 

## 2020-11-06 NOTE — Progress Notes (Signed)
Reason for the request:    Malignant adenopathy  HPI: I was asked by Dr. Claudia Desanctis to evaluate Ms. Skelley for the evaluation of malignant lymphadenopathy.  She is 64 year old woman with history of substance use, hypertension and hyperlipidemia who was evaluated by Dr. Claudia Desanctis for hydronephrosis in May 2021.  She underwent a ureteroscopy on 2 separate occasions most recently in July 2021 with biopsy shows negative for malignancy.  She also had a ureteral stent placed and subsequently exchanged in July 2021.  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  She subsequently hospitalized between November 9 and November 12 after developing acute pyelonephritis and sepsis with metabolic encephalopathy.  During this hospitalization, she underwent a CT scan chest abdomen and pelvis with contrast on October 24, 2020 which showed bilateral reticular densities in both lungs which consistent with possible multifocal pneumonia with small bilateral effusion.  Bilateral ureteral stents are noted and grossly in good position with stable bilateral hydronephrosis is noted.  The intrarenal collecting system of the right pelvis remains hyperdense with possible hemorrhage noted.  Extensive retroperitoneal and para-aortic adenopathy were also noted.  Tissue biopsy obtained on October 24, 2020 which showed metastatic carcinoma with immunohistochemical stains showed positive cytokeratin AE1/3, cytokeratin 7 with patchy 2-week positive for cytokeratin 20, GATA3 and PAX 8.  The cells are negative for TTF-1, CDX2, CD3 and CD20.  The differential diagnosis of these findings include genitourinary versus gynecological primary.  Since her discharge, she reports improvement in her overall health but does reports of fatigue and tiredness.  He does report some mild dyspnea on exertion which related to her  COVID-19 pneumonia in September 2021.  She denies any cough or shortness of breath.  He denies any hemoptysis.  She has not reported any recent hematuria although she had significant hematuria previously.  She does not report any headaches, blurry vision, syncope or seizures. Does not report any fevers, chills or sweats.  Does not report any cough, wheezing or hemoptysis.  Does not report any chest pain, palpitation, orthopnea or leg edema.  Does not report any nausea, vomiting or abdominal pain.  Does not report any constipation or diarrhea.  Does not report any skeletal complaints.    Does not report frequency, urgency or hematuria.  Does not report any skin rashes or lesions. Does not report any heat or cold intolerance.  Does not report any lymphadenopathy or petechiae.  Does not report any anxiety or depression.  Remaining review of systems is negative.    Past Medical History:  Diagnosis Date  . Alcohol dependence ([place])   . Anxiety   . Arthritis    Back and lt knee  . Cancer ([place])    skin on nose  . Depression 07/17/2013  . GERD (gastroesophageal reflux disease)   . History of hepatitis C    TX FOR 2012  . History of kidney stones   . History of rheumatic fever AS CHILD   NO PROBLEMS FROM  . Hyperlipidemia 07/17/2013  . Hypertension   . Seasonal allergies   . Toe fracture, right 10/07/2016  . Vitamin D deficiency 07/17/2013  :  Past Surgical History:  Procedure Laterality Date  . ANAL RECTAL MANOMETRY N/A 01/31/2020   Procedure: ANO RECTAL MANOMETRY;  Surgeon: Arta Silence, MD;  Location: [facility];  Service: Endoscopy;  Laterality: N/A;  . [place]  . CYSTOSCOPY WITH  RETROGRADE PYELOGRAM, URETEROSCOPY AND STENT PLACEMENT Bilateral 06/11/2020   Procedure: CYSTOSCOPY WITH RETROGRADE PYELOGRAM, URETEROSCOPY AND STENT PLACEMENT, RIGHT URETERAL BIOPSY;  Surgeon: Robley Fries, MD;  Location: [facility];  Service: Urology;  Laterality: Bilateral;  90 MINS  . CYSTOSCOPY  WITH RETROGRADE PYELOGRAM, URETEROSCOPY AND STENT PLACEMENT Bilateral 07/05/2020   Procedure: CYSTOSCOPY WITH RETROGRADE PYELOGRAM, URETEROSCOPY,  BIOPSIES AND STENT EXCHANGES;  Surgeon: Robley Fries, MD;  Location[facility];  Service: Urology;  Laterality: Bilateral;  . FINGER SURGERY Right    5th  . GYNECOLOGIC CRYOSURGERY  YRS AGO  . TONSILLECTOMY  AS CHILD  :   Current Outpatient Medications:  .  acetaminophen (TYLENOL) 500 MG tablet, Take 1,000 mg by mouth every 8 (eight) hours as needed for moderate pain. , Disp: , Rfl:  .  albuterol (PROAIR HFA) 108 (90 Base) MCG/ACT inhaler, INHALE 1 TO 2 PUFFS BY MOUTH INTO THE LUNGS EVERY 4 HOURS AS NEEDED FOR WHEEZING OR SHORTNESS OF BREATH (Patient taking differently: Inhale 1-2 puffs into the lungs every 4 (four) hours as needed for wheezing or shortness of breath. INHALE 1 TO 2 PUFFS BY MOUTH INTO THE LUNGS EVERY 4 HOURS AS NEEDED FOR WHEEZING OR SHORTNESS OF BREATH), Disp: 8.5 g, Rfl: 1 .  amLODipine (NORVASC) 5 MG tablet, TAKE 1 TABLET BY MOUTH ONCE DAILY (Patient taking differently: Take 5 mg by mouth every evening. ), Disp: 90 tablet, Rfl: 1 .  ARIPiprazole (ABILIFY) 5 MG tablet, Take 2 tablets (10 mg total) by mouth at bedtime., Disp: 90 tablet, Rfl:  .  ascorbic acid (VITAMIN C) 500 MG tablet, Take 1,000 mg by mouth every evening. , Disp: , Rfl:  .  ASPIRIN LOW DOSE 81 MG EC tablet, TAKE 1 TABLET BY MOUTH DAILY. (Patient taking differently: Take 81 mg by mouth daily. ), Disp: 90 tablet, Rfl: 3 .  buPROPion (WELLBUTRIN XL) 300 MG 24 hr tablet, Take 1 tablet (300 mg total) by mouth daily., Disp: 90 tablet, Rfl: 1 .  BYSTOLIC 20 MG TABS, TAKE 1 TABLET (20 MG TOTAL) BY MOUTH DAILY. (Patient taking differently: Take 20 mg by mouth daily. ), Disp: 90 tablet, Rfl: 1 .  Cholecalciferol (VITAMIN D) 125 MCG (5000 UT) CAPS, Take 5,000 Units by mouth every evening. , Disp: , Rfl:  .  clonazePAM (KLONOPIN) 0.5 MG tablet, Take 1 tablet  (0.5 mg total) by mouth at bedtime., Disp: 30 tablet, Rfl: 2 .  Coenzyme Q10 (CO Q-10) 200 MG CAPS, Take 200 mg by mouth every evening. , Disp: , Rfl:  .  DULoxetine (CYMBALTA) 30 MG capsule, Take 30 mg by mouth daily. , Disp: , Rfl:  .  fexofenadine (ALLEGRA) 180 MG tablet, Take 180 mg by mouth every evening. , Disp: , Rfl:  .  furosemide (LASIX) 20 MG tablet, Take 20mg  on day 1 and 2 then every other day. (Patient taking differently: Take 20 mg by mouth as needed for edema. ), Disp: 30 tablet, Rfl: 3 .  gabapentin (NEURONTIN) 300 MG capsule, 1  qam   4   qhs (Patient taking differently: Take 300-1,200 mg by mouth See admin instructions. Take 300 mg by mouth in the morning and afternoon and 1200 mg at night), Disp: 150 capsule, Rfl: 5 .  GLUCOSAMINE-CHONDROITIN PO, Take 2 tablets by mouth every evening. , Disp: , Rfl:  .  guaiFENesin-dextromethorphan (ROBITUSSIN DM) 100-10 MG/5ML syrup, Take 10 mLs by mouth every 4 (four) hours as needed for cough., Disp:  473 mL, Rfl: 0 .  nitrofurantoin (MACRODANTIN) 100 MG capsule, Take 1 capsule (100 mg total) by mouth at bedtime., Disp: 90 capsule, Rfl: 1 .  Omega-3 Fatty Acids (FISH OIL) 1200 MG CAPS, Take 1,200 mg by mouth every evening. , Disp: , Rfl:  .  omeprazole (PRILOSEC) 20 MG capsule, Take 1 capsule (20 mg total) by mouth daily. (Patient taking differently: Take 20 mg by mouth every evening. ), Disp: 90 capsule, Rfl: 1 .  ondansetron (ZOFRAN-ODT) 4 MG disintegrating tablet, Take 1 tablet (4 mg total) by mouth every 8 (eight) hours as needed for nausea or vomiting., Disp: 12 tablet, Rfl: 0 .  oxyCODONE-acetaminophen (PERCOCET/ROXICET) 5-325 MG tablet, Take 1-2 tablets by mouth every 6 (six) hours as needed for moderate pain or severe pain., Disp: 30 tablet, Rfl: 0 .  simvastatin (ZOCOR) 20 MG tablet, TAKE 1 TABLET (20 MG TOTAL) BY MOUTH DAILY. (Patient taking differently: Take 20 mg by mouth daily at 6 PM. ), Disp: 90 tablet, Rfl: 1 .  tamsulosin (FLOMAX)  0.4 MG CAPS capsule, Take 0.4 mg by mouth at bedtime., Disp: , Rfl:  .  thiamine 100 MG tablet, Take 1 tablet (100 mg total) by mouth daily., Disp: 30 tablet, Rfl: 0 .  vitamin B-12 (CYANOCOBALAMIN) 1000 MCG tablet, Take 2,000 mcg by mouth every evening. , Disp: , Rfl:  .  Vitamin E 180 MG (400 UNIT) CAPS, Take 400 Units by mouth every evening. , Disp: , Rfl: :  Allergies  Allergen Reactions  . Metformin And Related Other (See Comments)    Lactic acidosis   . Celebrex [Celecoxib] Other (See Comments)    Dizziness, "makes me feel bad"  . Penicillins Hives and Rash    Reports hives to penicillin and amoxicillin as an adult "a long time ago." Has tolerated cefdinir (09/2020) and cefepime (10/2020) before.  :  Family History  Problem Relation Age of Onset  . Ovarian cancer Mother   . Cardiomyopathy Father   . Valvular heart disease Father   . Liver cancer Brother   . Protein C deficiency Brother   . Protein C deficiency Brother   . Stroke Brother   :  Social History   Socioeconomic History  . Marital status: Married    Spouse name: Not on file  . Number of children: Not on file  . Years of education: Not on file  . Highest education level: Not on file  Occupational History  . Not on file  Tobacco Use  . Smoking status: Former Smoker    Packs/day: 1.00    Years: 25.00    Pack years: 25.00    Types: Cigarettes    Quit date: 2012    Years since quitting: 9.9  . Smokeless tobacco: Never Used  Vaping Use  . Vaping Use: Every day  . Substances: Nicotine  Substance and Sexual Activity  . Alcohol use: Not Currently    Alcohol/week: 24.0 - 36.0 standard drinks    Types: 24 - 36 Cans of beer per week    Comment: Pt reports that she stopped march 2021  . Drug use: No  . Sexual activity: Yes    Birth control/protection: None  Other Topics Concern  . Not on file  Social History Narrative  . Not on file   Social Determinants of Health   Financial Resource Strain:   .  Difficulty of Paying Living Expenses: Not on file  Food Insecurity:   . Worried About [employer] of  Food in the Last Year: Not on file  . Ran Out of Food in the Last Year: Not on file  Transportation Needs:   . Lack of Transportation (Medical): Not on file  . Lack of Transportation (Non-Medical): Not on file  Physical Activity:   . Days of Exercise per Week: Not on file  . Minutes of Exercise per Session: Not on file  Stress:   . Feeling of Stress : Not on file  Social Connections:   . Frequency of Communication with Friends and Family: Not on file  . Frequency of Social Gatherings with Friends and Family: Not on file  . Attends Religious Services: Not on file  . Active Member of Clubs or Organizations: Not on file  . Attends Archivist Meetings: Not on file  . Marital Status: Not on file  Intimate Partner Violence:   . Fear of Current or Ex-Partner: Not on file  . Emotionally Abused: Not on file  . Physically Abused: Not on file  . Sexually Abused: Not on file  :  Pertinent items are noted in HPI.  Exam: Blood pressure (!) 156/83, pulse 73, temperature (!) 97 F (36.1 C), temperature source Tympanic, resp. rate 18, height 5\' 6"  (1.676 m), weight 191 lb (86.6 kg), SpO2 100 %.  ECOG 1  General appearance: alert and cooperative appeared without distress. Head: atraumatic without any abnormalities. Eyes: conjunctivae/corneas clear. PERRL.  Sclera anicteric. Throat: lips, mucosa, and tongue normal; without oral thrush or ulcers. Resp: Scattered rhonchi noted bilaterally.  Expiratory wheezes noted. Cardio: regular rate and rhythm, S1, S2 normal, no murmur, click, rub or gallop GI: soft, non-tender; bowel sounds normal; no masses,  no organomegaly Skin: Skin color, texture, turgor normal. No rashes or lesions Lymph nodes: Cervical, supraclavicular, and axillary nodes normal. Neurologic: Grossly normal without any motor, sensory or deep tendon  reflexes. Musculoskeletal: No joint deformity or effusion.  CBC    Component Value Date/Time   WBC 11.0 (H) 10/25/2020 0725   RBC 2.93 (L) 10/25/2020 0725   HGB 8.5 (L) 10/25/2020 0725   HGB 12.4 04/26/2018 1333   HCT 27.1 (L) 10/25/2020 0725   HCT 39.1 04/26/2018 1333   PLT 355 10/25/2020 0725   PLT 327 04/26/2018 1333   MCV 92.5 10/25/2020 0725   MCV 97 04/26/2018 1333   MCH 29.0 10/25/2020 0725   MCHC 31.4 10/25/2020 0725   RDW 15.8 (H) 10/25/2020 0725   RDW 13.7 04/26/2018 1333   LYMPHSABS 3.0 10/23/2020 0449   LYMPHSABS 2.6 04/26/2018 1333   MONOABS 2.8 (H) 10/23/2020 0449   EOSABS 0.0 10/23/2020 0449   EOSABS 0.0 04/26/2018 1333   BASOSABS 0.1 10/23/2020 0449   BASOSABS 0.0 04/26/2018 1333     Chemistry      Component Value Date/Time   NA 131 (L) 10/25/2020 0725   NA 142 04/26/2018 1333   K 3.9 10/25/2020 0725   CL 107 10/25/2020 0725   CO2 17 (L) 10/25/2020 0725   BUN 7 (L) 10/25/2020 0725   BUN 22 04/26/2018 1333   CREATININE 0.77 10/25/2020 0725   CREATININE 0.83 09/22/2020 1330   GLU 87 02/07/2013 0000      Component Value Date/Time   CALCIUM 7.7 (L) 10/25/2020 0725   ALKPHOS 50 10/23/2020 0449   AST 19 10/23/2020 0449   ALT 10 10/23/2020 0449   BILITOT 0.6 10/23/2020 0449   BILITOT 0.3 01/10/2018 1350      CT CHEST ABDOMEN PELVIS W CONTRAST  Result Date: 10/24/2020 CLINICAL DATA:  Adenopathy. EXAM: CT CHEST, ABDOMEN, AND PELVIS WITH CONTRAST TECHNIQUE: Multidetector CT imaging of the chest, abdomen and pelvis was performed following the standard protocol during bolus administration of intravenous contrast. CONTRAST:  181mL OMNIPAQUE IOHEXOL 300 MG/ML  SOLN COMPARISON:  October 22, 2020. FINDINGS: CT CHEST FINDINGS Cardiovascular: No significant vascular findings. Normal heart size. No pericardial effusion. Mediastinum/Nodes: No enlarged mediastinal, hilar, or axillary lymph nodes. Thyroid gland, trachea, and esophagus demonstrate no significant  findings. Lungs/Pleura: No pneumothorax is noted. Small left pleural effusion is noted. Bilateral reticular densities are noted throughout both lungs consistent with a history of prior multifocal pneumonia due to COVID-19. Musculoskeletal: No chest wall mass or suspicious bone lesions identified. CT ABDOMEN PELVIS FINDINGS Hepatobiliary: No focal liver abnormality is seen. No gallstones, gallbladder wall thickening, or biliary dilatation. Pancreas: Unremarkable. No pancreatic ductal dilatation or surrounding inflammatory changes. Spleen: Normal in size without focal abnormality. Adrenals/Urinary Tract: Adrenal glands are unremarkable. Bilateral ureteral stents are noted in grossly good position. Stable mild bilateral hydronephrosis is noted. The intrarenal collecting system and right pelvis on the right remains hyperdense which is abnormal and concerning for possible hemorrhage. Stable mild inflammatory changes are seen around the urinary bladder concerning for possible cystitis. Mild perinephric stranding is noted. Stomach/Bowel: Stomach is within normal limits. Appendix appears normal. No evidence of bowel wall thickening, distention, or inflammatory changes. Vascular/Lymphatic: No significant vascular abnormality is noted. There continues remain extensive retroperitoneal and periaortic adenopathy with the largest lymph node measuring 17 mm in the left periaortic region consistent with metastatic disease. Reproductive: Uterus and bilateral adnexa are unremarkable. Other: No abdominal wall hernia or abnormality. No abdominopelvic ascites. Stable presacral stranding is noted consistent with inflammation. Musculoskeletal: No acute or significant osseous findings. IMPRESSION: 1. Bilateral reticular densities are noted throughout both lungs consistent with a history of prior multifocal pneumonia due to COVID-19. 2. Small left pleural effusion is noted. 3. Bilateral ureteral stents are noted in grossly good position.  Stable mild bilateral hydronephrosis is noted. The intrarenal collecting system and right pelvis on the right remains hyperdense which is abnormal and concerning for possible hemorrhage. Stable mild inflammatory changes are seen around the urinary bladder concerning for possible cystitis. 4. Stable extensive retroperitoneal and periaortic adenopathy is noted consistent with metastatic disease. 5. Stable presacral stranding is noted consistent with inflammation. Electronically Signed   By: Marijo Conception M.D.   On: 10/24/2020 12:06     CT Renal Stone Study  Result Date: 10/22/2020 CLINICAL DATA:  64 year old female with sepsis. Bilateral ureteral stents. Suspicion of urothelial neoplasm on September CT without and with contrast. EXAM: CT ABDOMEN AND PELVIS WITHOUT CONTRAST TECHNIQUE: Multidetector CT imaging of the abdomen and pelvis was performed following the standard protocol without IV contrast. COMPARISON:  [facility] CT Abdomen and Pelvis 09/09/2020 and earlier. FINDINGS: Lower chest: Small layering left pleural effusion is new since September. And there are increased left paraspinal and/or retrocrural soft tissue nodules (series 2, image 15) measuring up to 15 mm short axis now, versus 9-10 mm in September. No pericardial effusion. No right pleural effusion. Chronic appearing superimposed bilateral lung base disease with architectural distortion and reticular opacities. No definite acute lung base nodule. Hepatobiliary: Negative noncontrast liver and gallbladder. Pancreas: Negative. Spleen: Negative.  Stable splenule (normal variant). Adrenals/Urinary Tract: Both adrenal glands are now indistinct. Increased bilateral pararenal space soft tissue thickening and inflammatory stranding. Bilateral double-J ureteral stents remain in place and appear appropriately position.  However, bilateral hydronephrosis persists and the right renal collecting system is abnormally hyperdense as before.  Stranding continues along the course of both ureters to the bladder. Perivesical stranding a has not significantly changed. No discrete bladder mass. Stomach/Bowel: No dilated large or small bowel loops. There is scattered fluid in the colon. No definite acute bowel inflammation. No free air. No free fluid. Vascular/Lymphatic: Vascular patency is not evaluated in the absence of IV contrast. Normal caliber abdominal aorta. Aortoiliac calcified atherosclerosis. Progressed and now confluent bilateral Peri aortic lymphadenopathy at the level of the kidneys (series 2, image 36) with bilateral lymph node conglomeration is up to 2.5 cm diameter (previously 1.5 cm). Lymphadenopathy tracks caudally in the retroperitoneum to the iliac node stations. Reproductive: Stable, negative noncontrast appearance. Other: Stable presacral stranding since September. No pelvic free fluid. Musculoskeletal: No acute or suspicious osseous lesion identified. IMPRESSION: 1. Progressed retroperitoneal, left paraspinal, and pelvic lymphadenopathy since September compatible with progressive metastatic nodal disease. 2. Bilateral double-J ureteral stents remain appropriately positioned, however, the bilateral renal collecting systems remain dilated and abnormal as described in September, with underlying Urothelial Neoplasm. Persistent bilateral urinary inflammation. 3. New small layering left pleural effusion.  Chronic lung disease. 4. Aortic Atherosclerosis (ICD10-I70.0). Electronically Signed   By: Genevie Ann M.D.   On: 10/22/2020 18:31    Assessment and Plan:    64 year old woman with:  1.  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  Tissue biopsy obtained at that time confirmed the presence of metastatic carcinoma arising from a GU or GYN etiology.  She is status post ureteroscopy and biopsies previously by Dr. Claudia Desanctis without any clear-cut evidence of  malignancy.  The natural course of this disease was reviewed and treatment options were discussed at this time.  This appears to be an advanced malignancy arising from unknown primary.  There is no evidence of primary tumor in the GU tract and no clear-cut evidence of GYN primary.   Treatment options at this time were discussed and systemic therapy remains the most definitive option.  Combination chemotherapy utilizing carboplatin and Gemzar versus Keytruda would be reasonable option to attempt to palliate this disease and decrease his lymphadenopathy.  I see no role for surgical therapy or radiation at this time.  The logistics and rationale for using chemotherapy was reviewed today in detail. These complications include nausea, vomiting, myelosuppression, fatigue, infusion related complications, renal insufficiency, neutropenia, neutropenic sepsis and rarely serious thrombosis, hospitalization and death.  The benefit would also if he has an excellent response to chemotherapy, curative surgical resection may be attempted.      After discussion today, he is agreeable to proceed after chemo education class.  We will send her specimen for [facility] testing.     2.  IV access: Risks and benefits of using Port-A-Cath versus peripheral veins was discussed today.  Complication associated with Port-A-Cath insertion include bleeding, infection and thrombosis.  After discussing the risks and benefits, she is agreeable to proceed.   3.  Antiemetics: Prescription for Compazine will be made available.   4.  Renal function surveillance:  Baseline kidney function is normal at this time.   5.  Goals of care:  Therapy is palliative although aggressive measures are warranted given her reasonable performance status.  6.  Anemia: Related to malignancy and blood loss.  We will continue to monitor her counts and transfuse as needed.   7.  Follow-up: will be in the immediate future to  start chemotherapy.   60   minutes were dedicated to this visit. The time was spent on reviewing laboratory data, imaging studies, discussing treatment options, discussing differential diagnosis and answering questions regarding future plan.     A copy of this consult has been forwarded to the requesting physician.

## 2020-11-08 ENCOUNTER — Telehealth: Payer: Self-pay | Admitting: Oncology

## 2020-11-08 NOTE — Telephone Encounter (Signed)
Scheduled per 11/24 los, patient has been called and voicemail was left.

## 2020-11-11 ENCOUNTER — Other Ambulatory Visit: Payer: Self-pay

## 2020-11-11 ENCOUNTER — Inpatient Hospital Stay: Payer: No Typology Code available for payment source

## 2020-11-13 ENCOUNTER — Encounter: Payer: Self-pay | Admitting: Osteopathic Medicine

## 2020-11-13 ENCOUNTER — Ambulatory Visit (INDEPENDENT_AMBULATORY_CARE_PROVIDER_SITE_OTHER): Payer: No Typology Code available for payment source | Admitting: Osteopathic Medicine

## 2020-11-13 ENCOUNTER — Telehealth (INDEPENDENT_AMBULATORY_CARE_PROVIDER_SITE_OTHER): Payer: No Typology Code available for payment source | Admitting: Osteopathic Medicine

## 2020-11-13 ENCOUNTER — Other Ambulatory Visit: Payer: Self-pay

## 2020-11-13 VITALS — BP 143/82 | HR 69 | Temp 98.0°F | Wt 190.0 lb

## 2020-11-13 DIAGNOSIS — Z23 Encounter for immunization: Secondary | ICD-10-CM

## 2020-11-13 DIAGNOSIS — F418 Other specified anxiety disorders: Secondary | ICD-10-CM | POA: Diagnosis not present

## 2020-11-13 DIAGNOSIS — K591 Functional diarrhea: Secondary | ICD-10-CM | POA: Diagnosis not present

## 2020-11-13 DIAGNOSIS — C801 Malignant (primary) neoplasm, unspecified: Secondary | ICD-10-CM | POA: Diagnosis not present

## 2020-11-13 MED ORDER — CHOLESTYRAMINE LIGHT 4 G PO PACK: 4.0000 g | PACK | Freq: Two times a day (BID) | ORAL | 1 refills | Status: DC

## 2020-11-13 NOTE — Progress Notes (Addendum)
Telemedicine Visit via  Video & Audio (App used: [email or web address])    I connected with Mary Stark on 11/13/20 at 2:02 PM  by phone or  telemedicine application as noted above  I verified that I am speaking with or regarding  the correct patient using two identifiers.  Participants: Myself, Dr [name] DO Patient: Mary Stark Patient proxy if applicable: none Other, if applicable: none  Patient is in separate location from myself - presumably home I am in office at [facility]    I discussed the limitations of evaluation and management  by telemedicine and the availability of in person appointments.  The participant(s) above expressed understanding and  agreed to proceed with this appointment via telemedicine.       History of Present Illness: Mary Stark is a 64 y.o. female who would like to discuss mental health and diarrhea. She has not started the increased Abilify dose yet. She plans to do this today. She reports persistent diarrhea despite lomotil, would like to try other Rx for this. Otherwise hanging in there, will be starting chemo for recent cancer diagnosis.           Observations/Objective: BP (!) 143/82   Pulse 69   Temp 98 F (36.7 C)   Wt 190 lb (86.2 kg)   BMI 30.67 kg/m  BP Readings from Last 3 Encounters:  11/13/20 (!) 143/82  11/06/20 (!) 156/83  10/30/20 (!) 141/70   Exam: Normal Speech.    Lab and Radiology Results No results found for this or any previous visit (from the past 72 hour(s)). No results found.     Assessment and Plan: 64 y.o. female with The primary encounter diagnosis was Depression with anxiety. Diagnoses of Malignancy ([place]) and Functional diarrhea were also pertinent to this visit.  Trial cholestyramine for persistent diarrhea May consider malabsorbtion issue - possible d/t abx? If not better will get stool studies and other labs   PDMP not reviewed this encounter. No orders of the  defined types were placed in this encounter.  Meds ordered this encounter  Medications  . cholestyramine light (PREVALITE) 4 g packet    Sig: Take 1 packet (4 g total) by mouth 2 (two) times daily.    Dispense:  60 each    Refill:  1     Follow Up Instructions: Return in about 2 weeks (around 11/27/2020) for recheck mental health, GI issues. , VIRTUAL *OR* IN-OFFICE VISIT.    I discussed the assessment and treatment plan with the patient. The patient was provided an opportunity to ask questions and all were answered. The patient agreed with the plan and demonstrated an understanding of the instructions.   The patient was advised to call back or seek an in-person evaluation if any new concerns, if symptoms worsen or if the condition fails to improve as anticipated.  30 minutes of non-face-to-face time was provided during this encounter.      . . . . . . . . . . . . . Marland Kitchen                   Historical information moved to improve visibility of documentation.  Past Medical History:  Diagnosis Date  . Alcohol dependence ([place])   . Anxiety   . Arthritis    Back and lt knee  . Cancer ([place])    skin on nose  . Depression 07/17/2013  . GERD (gastroesophageal reflux disease)   . History of  hepatitis C    TX FOR 2012  . History of kidney stones   . History of rheumatic fever AS CHILD   NO PROBLEMS FROM  . Hyperlipidemia 07/17/2013  . Hypertension   . Seasonal allergies   . Toe fracture, right 10/07/2016  . Vitamin D deficiency 07/17/2013   Past Surgical History:  Procedure Laterality Date  . ANAL RECTAL MANOMETRY N/A 01/31/2020   Procedure: ANO RECTAL MANOMETRY;  Surgeon: Arta Silence, MD;  Location: [facility];  Service: Endoscopy;  Laterality: N/A;  . [place]  . CYSTOSCOPY WITH RETROGRADE PYELOGRAM, URETEROSCOPY AND STENT PLACEMENT Bilateral 06/11/2020   Procedure: CYSTOSCOPY WITH RETROGRADE PYELOGRAM, URETEROSCOPY AND STENT  PLACEMENT, RIGHT URETERAL BIOPSY;  Surgeon: Robley Fries, MD;  Location: [facility];  Service: Urology;  Laterality: Bilateral;  90 MINS  . CYSTOSCOPY WITH RETROGRADE PYELOGRAM, URETEROSCOPY AND STENT PLACEMENT Bilateral 07/05/2020   Procedure: CYSTOSCOPY WITH RETROGRADE PYELOGRAM, URETEROSCOPY,  BIOPSIES AND STENT EXCHANGES;  Surgeon: Robley Fries, MD;  Location[facility];  Service: Urology;  Laterality: Bilateral;  . FINGER SURGERY Right    5th  . GYNECOLOGIC CRYOSURGERY  YRS AGO  . TONSILLECTOMY  AS CHILD   Social History   Tobacco Use  . Smoking status: Former Smoker    Packs/day: 1.00    Years: 25.00    Pack years: 25.00    Types: Cigarettes    Quit date: 2012    Years since quitting: 9.9  . Smokeless tobacco: Never Used  Substance Use Topics  . Alcohol use: Not Currently    Alcohol/week: 24.0 - 36.0 standard drinks    Types: 24 - 36 Cans of beer per week    Comment: Pt reports that she stopped march 2021   family history includes Cardiomyopathy in her father; Liver cancer in her brother; Ovarian cancer in her mother; Protein C deficiency in her brother and brother; Stroke in her brother; Valvular heart disease in her father.  Medications: Current Outpatient Medications  Medication Sig Dispense Refill  . acetaminophen (TYLENOL) 500 MG tablet Take 1,000 mg by mouth every 8 (eight) hours as needed for moderate pain.     Marland Kitchen albuterol (PROAIR HFA) 108 (90 Base) MCG/ACT inhaler INHALE 1 TO 2 PUFFS BY MOUTH INTO THE LUNGS EVERY 4 HOURS AS NEEDED FOR WHEEZING OR SHORTNESS OF BREATH (Patient taking differently: Inhale 1-2 puffs into the lungs every 4 (four) hours as needed for wheezing or shortness of breath. INHALE 1 TO 2 PUFFS BY MOUTH INTO THE LUNGS EVERY 4 HOURS AS NEEDED FOR WHEEZING OR SHORTNESS OF BREATH) 8.5 g 1  . amLODipine (NORVASC) 5 MG tablet TAKE 1 TABLET BY MOUTH ONCE DAILY (Patient taking differently: Take 5 mg by mouth every evening. ) 90 tablet 1   . ARIPiprazole (ABILIFY) 5 MG tablet Take 2 tablets (10 mg total) by mouth at bedtime. 90 tablet   . ascorbic acid (VITAMIN C) 500 MG tablet Take 1,000 mg by mouth every evening.     . ASPIRIN LOW DOSE 81 MG EC tablet TAKE 1 TABLET BY MOUTH DAILY. (Patient taking differently: Take 81 mg by mouth daily. ) 90 tablet 3  . buPROPion (WELLBUTRIN XL) 300 MG 24 hr tablet Take 1 tablet (300 mg total) by mouth daily. 90 tablet 1  . BYSTOLIC 20 MG TABS TAKE 1 TABLET (20 MG TOTAL) BY MOUTH DAILY. (Patient taking differently: Take 20 mg by mouth daily. ) 90 tablet 1  . Cholecalciferol (VITAMIN  D) 125 MCG (5000 UT) CAPS Take 5,000 Units by mouth every evening.     . clonazePAM (KLONOPIN) 0.5 MG tablet Take 1 tablet (0.5 mg total) by mouth at bedtime. 30 tablet 2  . Coenzyme Q10 (CO Q-10) 200 MG CAPS Take 200 mg by mouth every evening.     . fexofenadine (ALLEGRA) 180 MG tablet Take 180 mg by mouth every evening.     . furosemide (LASIX) 20 MG tablet Take 20mg  on day 1 and 2 then every other day. (Patient taking differently: Take 20 mg by mouth as needed for edema. ) 30 tablet 3  . gabapentin (NEURONTIN) 300 MG capsule 1  qam   4   qhs (Patient taking differently: Take 300-1,200 mg by mouth See admin instructions. Take 300 mg by mouth in the morning and afternoon and 1200 mg at night) 150 capsule 5  . GLUCOSAMINE-CHONDROITIN PO Take 2 tablets by mouth every evening.     Marland Kitchen guaiFENesin-dextromethorphan (ROBITUSSIN DM) 100-10 MG/5ML syrup Take 10 mLs by mouth every 4 (four) hours as needed for cough. 473 mL 0  . lidocaine-prilocaine (EMLA) cream Apply 1 application topically as needed. 30 g 0  . nitrofurantoin (MACRODANTIN) 100 MG capsule Take 1 capsule (100 mg total) by mouth at bedtime. 90 capsule 1  . Omega-3 Fatty Acids (FISH OIL) 1200 MG CAPS Take 1,200 mg by mouth every evening.     Marland Kitchen omeprazole (PRILOSEC) 20 MG capsule Take 1 capsule (20 mg total) by mouth daily. (Patient taking differently: Take 20 mg by  mouth every evening. ) 90 capsule 1  . ondansetron (ZOFRAN-ODT) 4 MG disintegrating tablet Take 1 tablet (4 mg total) by mouth every 8 (eight) hours as needed for nausea or vomiting. 12 tablet 0  . oxyCODONE-acetaminophen (PERCOCET/ROXICET) 5-325 MG tablet Take 1-2 tablets by mouth every 6 (six) hours as needed for moderate pain or severe pain. 30 tablet 0  . prochlorperazine (COMPAZINE) 10 MG tablet Take 1 tablet (10 mg total) by mouth every 6 (six) hours as needed for nausea or vomiting. 30 tablet 0  . simvastatin (ZOCOR) 20 MG tablet TAKE 1 TABLET (20 MG TOTAL) BY MOUTH DAILY. (Patient taking differently: Take 20 mg by mouth daily at 6 PM. ) 90 tablet 1  . thiamine 100 MG tablet Take 1 tablet (100 mg total) by mouth daily. 30 tablet 0  . vitamin B-12 (CYANOCOBALAMIN) 1000 MCG tablet Take 2,000 mcg by mouth every evening.     . Vitamin E 180 MG (400 UNIT) CAPS Take 400 Units by mouth every evening.     . cholestyramine light (PREVALITE) 4 g packet Take 1 packet (4 g total) by mouth 2 (two) times daily. 60 each 1  . DULoxetine (CYMBALTA) 30 MG capsule Take 30 mg by mouth daily.  (Patient not taking: Reported on 11/13/2020)    . tamsulosin (FLOMAX) 0.4 MG CAPS capsule Take 0.4 mg by mouth at bedtime. (Patient not taking: Reported on 11/13/2020)     No current facility-administered medications for this visit.   Allergies  Allergen Reactions  . Metformin And Related Other (See Comments)    Lactic acidosis   . Celebrex [Celecoxib] Other (See Comments)    Dizziness, "makes me feel bad"  . Penicillins Hives and Rash    Reports hives to penicillin and amoxicillin as an adult "a long time ago." Has tolerated cefdinir (09/2020) and cefepime (10/2020) before.

## 2020-11-14 ENCOUNTER — Other Ambulatory Visit: Payer: Self-pay | Admitting: Radiology

## 2020-11-15 ENCOUNTER — Ambulatory Visit: Payer: No Typology Code available for payment source | Admitting: Sports Medicine

## 2020-11-15 MED FILL — buPROPion HCL ER (XL) 300 M: 300 | 30 days supply | Qty: 30 | Fill #3

## 2020-11-18 ENCOUNTER — Encounter (HOSPITAL_COMMUNITY): Payer: Self-pay

## 2020-11-18 ENCOUNTER — Other Ambulatory Visit: Payer: Self-pay

## 2020-11-18 ENCOUNTER — Ambulatory Visit (HOSPITAL_COMMUNITY)
Admission: RE | Admit: 2020-11-18 | Discharge: 2020-11-18 | Disposition: A | Discharge status: Home / Self Care | Payer: No Typology Code available for payment source | Source: Ambulatory Visit | Attending: Oncology | Admitting: Oncology

## 2020-11-18 ENCOUNTER — Other Ambulatory Visit: Payer: Self-pay | Admitting: Oncology

## 2020-11-18 DIAGNOSIS — Z8249 Family history of ischemic heart disease and other diseases of the circulatory system: Secondary | ICD-10-CM | POA: Insufficient documentation

## 2020-11-18 DIAGNOSIS — C772 Secondary and unspecified malignant neoplasm of intra-abdominal lymph nodes: Secondary | ICD-10-CM | POA: Insufficient documentation

## 2020-11-18 DIAGNOSIS — C669 Malignant neoplasm of unspecified ureter: Secondary | ICD-10-CM | POA: Diagnosis not present

## 2020-11-18 DIAGNOSIS — Z95828 Presence of other vascular implants and grafts: Secondary | ICD-10-CM

## 2020-11-18 DIAGNOSIS — Z880 Allergy status to penicillin: Secondary | ICD-10-CM | POA: Diagnosis not present

## 2020-11-18 DIAGNOSIS — Z888 Allergy status to other drugs, medicaments and biological substances status: Secondary | ICD-10-CM | POA: Diagnosis not present

## 2020-11-18 DIAGNOSIS — C801 Malignant (primary) neoplasm, unspecified: Secondary | ICD-10-CM

## 2020-11-18 DIAGNOSIS — Z79899 Other long term (current) drug therapy: Secondary | ICD-10-CM | POA: Insufficient documentation

## 2020-11-18 DIAGNOSIS — Z87891 Personal history of nicotine dependence: Secondary | ICD-10-CM | POA: Insufficient documentation

## 2020-11-18 DIAGNOSIS — C786 Secondary malignant neoplasm of retroperitoneum and peritoneum: Secondary | ICD-10-CM | POA: Insufficient documentation

## 2020-11-18 HISTORY — PX: IR IMAGING GUIDED PORT INSERTION: IMG5740

## 2020-11-18 HISTORY — DX: Presence of other vascular implants and grafts: Z95.828

## 2020-11-18 LAB — CBC
HCT: 34.6 % — ABNORMAL LOW (ref 36.0–46.0)
Hemoglobin: 10.1 g/dL — ABNORMAL LOW (ref 12.0–15.0)
MCH: 28.3 pg (ref 26.0–34.0)
MCHC: 29.2 g/dL — ABNORMAL LOW (ref 30.0–36.0)
MCV: 96.9 fL (ref 80.0–100.0)
Platelets: 316 10*3/uL (ref 150–400)
RBC: 3.57 MIL/uL — ABNORMAL LOW (ref 3.87–5.11)
RDW: 16.8 % — ABNORMAL HIGH (ref 11.5–15.5)
WBC: 8.5 10*3/uL (ref 4.0–10.5)
nRBC: 0 % (ref 0.0–0.2)

## 2020-11-18 LAB — PROTIME-INR
INR: 1 (ref 0.8–1.2)
Prothrombin Time: 12.7 seconds (ref 11.4–15.2)

## 2020-11-18 LAB — APTT: aPTT: 29 seconds (ref 24–36)

## 2020-11-18 MED ORDER — LIDOCAINE HCL 1 % IJ SOLN
INTRAMUSCULAR | Status: AC
  Filled 2020-11-18: qty 20

## 2020-11-18 MED ORDER — MIDAZOLAM HCL 2 MG/2ML IJ SOLN
INTRAMUSCULAR | Status: AC
  Filled 2020-11-18: qty 4

## 2020-11-18 MED ORDER — LIDOCAINE HCL (PF) 1 % IJ SOLN
INTRAMUSCULAR | Status: AC | PRN
  Administered 2020-11-18: 5 mL via INTRADERMAL
  Administered 2020-11-18: 10 mL via INTRADERMAL

## 2020-11-18 MED ORDER — MIDAZOLAM HCL 2 MG/2ML IJ SOLN
INTRAMUSCULAR | Status: AC | PRN
  Administered 2020-11-18 (×3): 1 mg via INTRAVENOUS

## 2020-11-18 MED ORDER — SODIUM CHLORIDE 0.9 % IV SOLN: INTRAVENOUS | Status: DC

## 2020-11-18 MED ORDER — HEPARIN SOD (PORK) LOCK FLUSH 100 UNIT/ML IV SOLN
INTRAVENOUS | Status: AC
  Filled 2020-11-18: qty 5

## 2020-11-18 MED ORDER — FENTANYL CITRATE (PF) 100 MCG/2ML IJ SOLN
INTRAMUSCULAR | Status: AC | PRN
Start: 2020-11-18 — End: 2020-11-18
  Administered 2020-11-18 (×2): 50 ug via INTRAVENOUS

## 2020-11-18 MED ORDER — CLINDAMYCIN PHOSPHATE 900 MG/50ML IV SOLN
INTRAVENOUS | Status: AC
  Filled 2020-11-18: qty 50

## 2020-11-18 MED ORDER — CLINDAMYCIN PHOSPHATE 900 MG/50ML IV SOLN
900.0000 mg | Freq: Once | INTRAVENOUS | Status: AC
  Administered 2020-11-18: 900 mg via INTRAVENOUS

## 2020-11-18 MED ORDER — FENTANYL CITRATE (PF) 100 MCG/2ML IJ SOLN
INTRAMUSCULAR | Status: AC
  Filled 2020-11-18: qty 2

## 2020-11-18 MED ORDER — HEPARIN SOD (PORK) LOCK FLUSH 100 UNIT/ML IV SOLN
INTRAVENOUS | Status: AC | PRN
  Administered 2020-11-18: 500 [IU] via INTRAVENOUS

## 2020-11-18 NOTE — H&P (Signed)
Chief Complaint: Patient was seen in consultation today for port placement at the request of Shadad,Firas N  Referring Physician(s): Wyatt Portela  Supervising Physician: Aletta Edouard  Patient Status: [facility] - Out-pt  History of Present Illness: Mary Stark is a 64 y.o. 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 She is to begin chemotherapy later this week and referred to IR for port placement. PMHx, meds, labs, imaging, allergies reviewed. Feels well, no recent fevers, chills, illness. Has been NPO today as directed.    Past Medical History:  Diagnosis Date  . Alcohol dependence ([place])   . Anxiety   . Arthritis    Back and lt knee  . Cancer ([place])    skin on nose  . Depression 07/17/2013  . GERD (gastroesophageal reflux disease)   . History of hepatitis C    TX FOR 2012  . History of kidney stones   . History of rheumatic fever AS CHILD   NO PROBLEMS FROM  . Hyperlipidemia 07/17/2013  . Hypertension   . Seasonal allergies   . Toe fracture, right 10/07/2016  . Vitamin D deficiency 07/17/2013    Past Surgical History:  Procedure Laterality Date  . ANAL RECTAL MANOMETRY N/A 01/31/2020   Procedure: ANO RECTAL MANOMETRY;  Surgeon: Arta Silence, MD;  Location: [facility];  Service: Endoscopy;  Laterality: N/A;  . Ali Molina  . CYSTOSCOPY WITH RETROGRADE PYELOGRAM, URETEROSCOPY AND STENT PLACEMENT Bilateral 06/11/2020   Procedure: CYSTOSCOPY WITH RETROGRADE PYELOGRAM, URETEROSCOPY AND STENT PLACEMENT, RIGHT URETERAL BIOPSY;  Surgeon: Robley Fries, MD;  Location: [facility];  Service: Urology;  Laterality: Bilateral;  90 MINS  . CYSTOSCOPY WITH RETROGRADE PYELOGRAM, URETEROSCOPY AND STENT PLACEMENT Bilateral 07/05/2020   Procedure: CYSTOSCOPY WITH RETROGRADE PYELOGRAM, URETEROSCOPY,  BIOPSIES AND STENT EXCHANGES;  Surgeon: Robley Fries, MD;  Location: [facility];  Service: Urology;   Laterality: Bilateral;  . FINGER SURGERY Right    5th  . GYNECOLOGIC CRYOSURGERY  YRS AGO  . TONSILLECTOMY  AS CHILD    Allergies: Metformin and related, Celebrex [celecoxib], and Penicillins  Medications: Prior to Admission medications   Medication Sig Start Date End Date Taking? Authorizing Provider  acetaminophen (TYLENOL) 500 MG tablet Take 1,000 mg by mouth every 8 (eight) hours as needed for moderate pain.     [provider]  albuterol (PROAIR HFA) 108 (90 Base) MCG/ACT inhaler INHALE 1 TO 2 PUFFS BY MOUTH INTO THE LUNGS EVERY 4 HOURS AS NEEDED FOR WHEEZING OR SHORTNESS OF BREATH Patient taking differently: Inhale 1-2 puffs into the lungs every 4 (four) hours as needed for wheezing or shortness of breath. INHALE 1 TO 2 PUFFS BY MOUTH INTO THE LUNGS EVERY 4 HOURS AS NEEDED FOR WHEEZING OR SHORTNESS OF BREATH 08/28/20   [name], DO  amLODipine (NORVASC) 5 MG tablet TAKE 1 TABLET BY MOUTH ONCE DAILY Patient taking differently: Take 5 mg by mouth every evening.  06/13/20   [name], DO  ARIPiprazole (ABILIFY) 5 MG tablet Take 2 tablets (10 mg total) by mouth at bedtime. 10/30/20   [name], DO  ascorbic acid (VITAMIN C) 500 MG tablet Take 1,000 mg by mouth every evening.     [provider]  ASPIRIN LOW DOSE 81 MG EC tablet TAKE 1 TABLET BY MOUTH DAILY. Patient taking differently: Take 81 mg by mouth daily.  04/09/20   [name], DO  buPROPion (WELLBUTRIN XL) 300 MG  24 hr tablet Take 1 tablet (300 mg total) by mouth daily. 11/22/19   Plovsky, Berneta Sages, MD  BYSTOLIC 20 MG TABS TAKE 1 TABLET (20 MG TOTAL) BY MOUTH DAILY. Patient taking differently: Take 20 mg by mouth daily.  10/17/20   [name], DO  Cholecalciferol (VITAMIN D) 125 MCG (5000 UT) CAPS Take 5,000 Units by mouth every evening.     [provider]  cholestyramine light (PREVALITE) 4 g packet Take 1 packet (4 g total) by mouth 2 (two) times daily. 11/13/20    [name], DO  clonazePAM (KLONOPIN) 0.5 MG tablet Take 1 tablet (0.5 mg total) by mouth at bedtime. 10/30/20   [name], DO  Coenzyme Q10 (CO Q-10) 200 MG CAPS Take 200 mg by mouth every evening.     [provider]  DULoxetine (CYMBALTA) 30 MG capsule Take 30 mg by mouth daily.  Patient not taking: Reported on 11/13/2020 09/17/20   [provider]  fexofenadine (ALLEGRA) 180 MG tablet Take 180 mg by mouth every evening.     [provider]  furosemide (LASIX) 20 MG tablet Take 20mg  on day 1 and 2 then every other day. Patient taking differently: Take 20 mg by mouth as needed for edema.  09/13/20   Orma Render, NP  gabapentin (NEURONTIN) 300 MG capsule 1  qam   4   qhs Patient taking differently: Take 300-1,200 mg by mouth See admin instructions. Take 300 mg by mouth in the morning and afternoon and 1200 mg at night 11/22/19   Plovsky, Berneta Sages, MD  GLUCOSAMINE-CHONDROITIN PO Take 2 tablets by mouth every evening.     [provider]  guaiFENesin-dextromethorphan (ROBITUSSIN DM) 100-10 MG/5ML syrup Take 10 mLs by mouth every 4 (four) hours as needed for cough. 08/28/20   [name], DO  lidocaine-prilocaine (EMLA) cream Apply 1 application topically as needed. 11/06/20   Wyatt Portela, MD  nitrofurantoin (MACRODANTIN) 100 MG capsule Take 1 capsule (100 mg total) by mouth at bedtime. 10/30/20   [name], DO  Omega-3 Fatty Acids (FISH OIL) 1200 MG CAPS Take 1,200 mg by mouth every evening.     [provider]  omeprazole (PRILOSEC) 20 MG capsule Take 1 capsule (20 mg total) by mouth daily. Patient taking differently: Take 20 mg by mouth every evening.  05/30/20   [name], DO  ondansetron (ZOFRAN-ODT) 4 MG disintegrating tablet Take 1 tablet (4 mg total) by mouth every 8 (eight) hours as needed for nausea or vomiting. 06/03/20   Noe Gens, PA-C  oxyCODONE-acetaminophen (PERCOCET/ROXICET) 5-325 MG tablet Take  1-2 tablets by mouth every 6 (six) hours as needed for moderate pain or severe pain. 10/30/20   [name], DO  prochlorperazine (COMPAZINE) 10 MG tablet Take 1 tablet (10 mg total) by mouth every 6 (six) hours as needed for nausea or vomiting. 11/06/20   Wyatt Portela, MD  simvastatin (ZOCOR) 20 MG tablet TAKE 1 TABLET (20 MG TOTAL) BY MOUTH DAILY. Patient taking differently: Take 20 mg by mouth daily at 6 PM.  09/19/20   [name], DO  tamsulosin (FLOMAX) 0.4 MG CAPS capsule Take 0.4 mg by mouth at bedtime. Patient not taking: Reported on 11/13/2020 08/07/20   [provider]  thiamine 100 MG tablet Take 1 tablet (100 mg total) by mouth daily. 10/26/20   Bonnielee Haff, MD  vitamin B-12 (CYANOCOBALAMIN) 1000 MCG tablet Take 2,000 mcg by mouth every evening.     [provider]  Vitamin E 180 MG (400 UNIT) CAPS Take 400 Units by mouth every evening.     [provider]     Family History  Problem Relation Age of Onset  . Ovarian cancer Mother   . Cardiomyopathy Father   . Valvular heart disease Father   . Liver cancer Brother   . Protein C deficiency Brother   . Protein C deficiency Brother   . Stroke Brother     Social History   Socioeconomic History  . Marital status: Married    Spouse name: Not on file  . Number of children: Not on file  . Years of education: Not on file  . Highest education level: Not on file  Occupational History  . Not on file  Tobacco Use  . Smoking status: Former Smoker    Packs/day: 1.00    Years: 25.00    Pack years: 25.00    Types: Cigarettes    Quit date: 2012    Years since quitting: 9.9  . Smokeless tobacco: Never Used  Vaping Use  . Vaping Use: Every day  . Substances: Nicotine  Substance and Sexual Activity  . Alcohol use: Not Currently    Alcohol/week: 24.0 - 36.0 standard drinks    Types: 24 - 36 Cans of beer per week    Comment: Pt reports that she stopped march 2021  . Drug use: No  .  Sexual activity: Yes    Birth control/protection: None  Other Topics Concern  . Not on file  Social History Narrative  . Not on file   Social Determinants of Health   Financial Resource Strain:   . Difficulty of Paying Living Expenses: Not on file  Food Insecurity:   . Worried About Charity fundraiser in the Last Year: Not on file  . Ran Out of Food in the Last Year: Not on file  Transportation Needs:   . Lack of Transportation (Medical): Not on file  . Lack of Transportation (Non-Medical): Not on file  Physical Activity:   . Days of Exercise per Week: Not on file  . Minutes of Exercise per Session: Not on file  Stress:   . Feeling of Stress : Not on file  Social Connections:   . Frequency of Communication with Friends and Family: Not on file  . Frequency of Social Gatherings with Friends and Family: Not on file  . Attends Religious Services: Not on file  . Active Member of Clubs or Organizations: Not on file  . Attends Archivist Meetings: Not on file  . Marital Status: Not on file     Review of Systems: A 12 point ROS discussed and pertinent positives are indicated in the HPI above.  All other systems are negative.  Review of Systems  Vital Signs: There were no vitals taken for this visit.  Physical Exam Constitutional:      Appearance: Normal appearance. She is not ill-appearing.  HENT:     Mouth/Throat:     Mouth: Mucous membranes are moist.     Pharynx: Oropharynx is clear.  Cardiovascular:     Rate and Rhythm: Normal rate and regular rhythm.     Heart sounds: Normal heart sounds.  Pulmonary:     Effort: Pulmonary effort is normal. No respiratory distress.     Breath sounds: Normal breath sounds.  Skin:    General: Skin is warm and dry.  Neurological:     General: No focal deficit present.  Mental Status: She is alert and oriented to person, place, and time.  Psychiatric:        Mood and Affect: Mood normal.        Thought Content: Thought  content normal.        Judgment: Judgment normal.      Imaging: CT Head Wo Contrast  Result Date: 10/22/2020 CLINICAL DATA:  Delirium, sepsis EXAM: CT HEAD WITHOUT CONTRAST TECHNIQUE: Contiguous axial images were obtained from the base of the skull through the vertex without intravenous contrast. COMPARISON:  None. FINDINGS: Brain: No acute infarct or hemorrhage. Lateral ventricles and midline structures are unremarkable. There are no acute extra-axial fluid collections. There is no mass effect. Vascular: No hyperdense vessel or unexpected calcification. Skull: Normal. Negative for fracture or focal lesion. Sinuses/Orbits: No acute finding. Other: None. IMPRESSION: 1. No acute intracranial process. Electronically Signed   By: Randa Ngo M.D.   On: 10/22/2020 18:24   CT CHEST ABDOMEN PELVIS W CONTRAST  Result Date: 10/24/2020 CLINICAL DATA:  Adenopathy. EXAM: CT CHEST, ABDOMEN, AND PELVIS WITH CONTRAST TECHNIQUE: Multidetector CT imaging of the chest, abdomen and pelvis was performed following the standard protocol during bolus administration of intravenous contrast. CONTRAST:  189mL OMNIPAQUE IOHEXOL 300 MG/ML  SOLN COMPARISON:  October 22, 2020. FINDINGS: CT CHEST FINDINGS Cardiovascular: No significant vascular findings. Normal heart size. No pericardial effusion. Mediastinum/Nodes: No enlarged mediastinal, hilar, or axillary lymph nodes. Thyroid gland, trachea, and esophagus demonstrate no significant findings. Lungs/Pleura: No pneumothorax is noted. Small left pleural effusion is noted. Bilateral reticular densities are noted throughout both lungs consistent with a history of prior multifocal pneumonia due to COVID-19. Musculoskeletal: No chest wall mass or suspicious bone lesions identified. CT ABDOMEN PELVIS FINDINGS Hepatobiliary: No focal liver abnormality is seen. No gallstones, gallbladder wall thickening, or biliary dilatation. Pancreas: Unremarkable. No pancreatic ductal dilatation or  surrounding inflammatory changes. Spleen: Normal in size without focal abnormality. Adrenals/Urinary Tract: Adrenal glands are unremarkable. Bilateral ureteral stents are noted in grossly good position. Stable mild bilateral hydronephrosis is noted. The intrarenal collecting system and right pelvis on the right remains hyperdense which is abnormal and concerning for possible hemorrhage. Stable mild inflammatory changes are seen around the urinary bladder concerning for possible cystitis. Mild perinephric stranding is noted. Stomach/Bowel: Stomach is within normal limits. Appendix appears normal. No evidence of bowel wall thickening, distention, or inflammatory changes. Vascular/Lymphatic: No significant vascular abnormality is noted. There continues remain extensive retroperitoneal and periaortic adenopathy with the largest lymph node measuring 17 mm in the left periaortic region consistent with metastatic disease. Reproductive: Uterus and bilateral adnexa are unremarkable. Other: No abdominal wall hernia or abnormality. No abdominopelvic ascites. Stable presacral stranding is noted consistent with inflammation. Musculoskeletal: No acute or significant osseous findings. IMPRESSION: 1. Bilateral reticular densities are noted throughout both lungs consistent with a history of prior multifocal pneumonia due to COVID-19. 2. Small left pleural effusion is noted. 3. Bilateral ureteral stents are noted in grossly good position. Stable mild bilateral hydronephrosis is noted. The intrarenal collecting system and right pelvis on the right remains hyperdense which is abnormal and concerning for possible hemorrhage. Stable mild inflammatory changes are seen around the urinary bladder concerning for possible cystitis. 4. Stable extensive retroperitoneal and periaortic adenopathy is noted consistent with metastatic disease. 5. Stable presacral stranding is noted consistent with inflammation. Electronically Signed   By: Marijo Conception M.D.   On: 10/24/2020 12:06   CT BIOPSY  Result Date: 10/24/2020 INDICATION: Known  primary, now with bulky retroperitoneal lymphadenopathy. Please perform CT-guided biopsy for tissue diagnostic purposes. EXAM: CT-GUIDED RETROPERITONEAL LYMPH BIOPSY COMPARISON:  CT chest, abdomen pelvis-earlier same day; CT abdomen pelvis 10/22/2020 MEDICATIONS: None. ANESTHESIA/SEDATION: Fentanyl 100 mcg IV; Versed 2 mg IV Sedation time: 12 minutes; The patient was continuously monitored during the procedure by the interventional radiology nurse under my direct supervision. CONTRAST:  None. COMPLICATIONS: None immediate. PROCEDURE: Informed consent was obtained from the patient following an explanation of the procedure, risks, benefits and alternatives. A time out was performed prior to the initiation of the procedure. The patient was positioned prone on the CT table and a limited CT was performed for procedural planning demonstrating unchanged size and appearance of multiple pathologically enlarged retroperitoneal lymph nodes with dominant nodal conglomeration measuring approximately 2.5 x 2.0 cm (image 25 series 3). The procedure was planned. The operative site was prepped and draped in the usual sterile fashion. Appropriate trajectory was confirmed with a 22 gauge spinal needle after the adjacent tissues were anesthetized with 1% Lidocaine with epinephrine. Under intermittent CT guidance, a 17 gauge coaxial needle was advanced into the peripheral aspect of the dominant left-sided retroperitoneal nodal conglomeration. Appropriate positioning was confirmed and 6 core needle biopsy samples were obtained with an 18 gauge core needle biopsy device. The co-axial needle was removed following administration of a Gel-Foam slurry and superficial hemostasis was achieved with manual compression. A limited postprocedural CT was negative for hemorrhage or additional complication. A dressing was placed. The patient tolerated the  procedure well without immediate postprocedural complication. IMPRESSION: Technically successful CT guided core needle biopsy of dominant left-sided retroperitoneal nodal conglomeration. Electronically Signed   By: Sandi Mariscal M.D.   On: 10/24/2020 14:45   DG Chest Port 1 View  Result Date: 10/22/2020 CLINICAL DATA:  Sepsis, confusion, fever EXAM: PORTABLE CHEST 1 VIEW COMPARISON:  08/29/2020 FINDINGS: Single frontal view of the chest demonstrates an unremarkable cardiac silhouette. Diffuse interstitial prominence is again seen throughout the lungs, with near complete resolution of the bilateral airspace disease seen previously. There is minimal residual ground-glass opacity at the right lung base. No effusion or pneumothorax. No acute bony abnormalities. IMPRESSION: 1. Marked improvement in the bilateral airspace disease seen previously, compatible with improved pneumonia. 2. Diffuse interstitial prominence persists, likely postinflammatory scarring. Electronically Signed   By: Randa Ngo M.D.   On: 10/22/2020 16:57   CT Renal Stone Study  Result Date: 10/22/2020 CLINICAL DATA:  64 year old female with sepsis. Bilateral ureteral stents. Suspicion of urothelial neoplasm on September CT without and with contrast. EXAM: CT ABDOMEN AND PELVIS WITHOUT CONTRAST TECHNIQUE: Multidetector CT imaging of the abdomen and pelvis was performed following the standard protocol without IV contrast. COMPARISON:  [facility] CT Abdomen and Pelvis 09/09/2020 and earlier. FINDINGS: Lower chest: Small layering left pleural effusion is new since September. And there are increased left paraspinal and/or retrocrural soft tissue nodules (series 2, image 15) measuring up to 15 mm short axis now, versus 9-10 mm in September. No pericardial effusion. No right pleural effusion. Chronic appearing superimposed bilateral lung base disease with architectural distortion and reticular opacities. No definite acute lung  base nodule. Hepatobiliary: Negative noncontrast liver and gallbladder. Pancreas: Negative. Spleen: Negative.  Stable splenule (normal variant). Adrenals/Urinary Tract: Both adrenal glands are now indistinct. Increased bilateral pararenal space soft tissue thickening and inflammatory stranding. Bilateral double-J ureteral stents remain in place and appear appropriately position. However, bilateral hydronephrosis persists and the right renal collecting system is abnormally hyperdense  as before. Stranding continues along the course of both ureters to the bladder. Perivesical stranding a has not significantly changed. No discrete bladder mass. Stomach/Bowel: No dilated large or small bowel loops. There is scattered fluid in the colon. No definite acute bowel inflammation. No free air. No free fluid. Vascular/Lymphatic: Vascular patency is not evaluated in the absence of IV contrast. Normal caliber abdominal aorta. Aortoiliac calcified atherosclerosis. Progressed and now confluent bilateral Peri aortic lymphadenopathy at the level of the kidneys (series 2, image 36) with bilateral lymph node conglomeration is up to 2.5 cm diameter (previously 1.5 cm). Lymphadenopathy tracks caudally in the retroperitoneum to the iliac node stations. Reproductive: Stable, negative noncontrast appearance. Other: Stable presacral stranding since September. No pelvic free fluid. Musculoskeletal: No acute or suspicious osseous lesion identified. IMPRESSION: 1. Progressed retroperitoneal, left paraspinal, and pelvic lymphadenopathy since September compatible with progressive metastatic nodal disease. 2. Bilateral double-J ureteral stents remain appropriately positioned, however, the bilateral renal collecting systems remain dilated and abnormal as described in September, with underlying Urothelial Neoplasm. Persistent bilateral urinary inflammation. 3. New small layering left pleural effusion.  Chronic lung disease. 4. Aortic Atherosclerosis  (ICD10-I70.0). Electronically Signed   By: Genevie Ann M.D.   On: 10/22/2020 18:31    Labs:  CBC: Recent Labs    10/22/20 1440 10/23/20 0449 10/24/20 0430 10/25/20 0725  WBC 18.9* 16.8* 12.6* 11.0*  HGB 10.9* 8.6* 8.8* 8.5*  HCT 34.5* 27.7* 28.8* 27.1*  PLT 461* 317 339 355    COAGS: Recent Labs    10/22/20 1625  INR 1.2  APTT 35    BMP: Recent Labs    09/05/20 0832 09/05/20 0832 09/12/20 1215 09/12/20 1215 09/19/20 1456 09/19/20 1456 09/22/20 1330 09/22/20 1330 10/22/20 1440 10/23/20 0449 10/24/20 0430 10/25/20 0725  NA 135   < > 135   < > 134*   < > 135   < > 123* 126* 129* 131*  K 4.4   < > 4.4   < > 4.3   < > 4.2   < > 4.2 3.0* 3.2* 3.9  CL 99   < > 103   < > 101   < > 99   < > 88* 99 103 107  CO2 27   < > 23   < > 23   < > 27   < > 23 19* 19* 17*  GLUCOSE 90   < > 82   < > 83   < > 85   < > 106* 99 110* 116*  BUN 18   < > 12   < > 12   < > 8   < > 18 17 10  7*  CALCIUM 8.5*   < > 8.3*   < > 8.0*   < > 8.3*   < > 8.8* 7.2* 7.7* 7.7*  CREATININE 1.00*   < > 0.99   < > 1.06*   < > 0.83  --  1.32* 1.10* 1.10* 0.77  GFRNONAA 60   < > 61   < > 56*   < > 75  --  45* 56* 56* >60  GFRAA 69  --  70  --  65  --  87  --   --   --   --   --    < > = values in this interval not displayed.    LIVER FUNCTION TESTS: Recent Labs    08/21/20 0401 08/21/20 0401 08/29/20 1709 09/05/20 0832 09/19/20 1456 09/22/20  1330 10/22/20 1440 10/23/20 0449  BILITOT 0.3   < > 0.3   < > 0.3 0.3 0.5 0.6  AST 23   < > 14*   < > 12 32 21 19  ALT 17   < > 15   < > 6 16 11 10   ALKPHOS 61  --  59  --   --   --  70 50  PROT 6.3*   < > 7.1   < > 6.3 7.2 10.6* 7.9  ALBUMIN 2.5*  --  2.6*  --   --   --  3.3* 2.6*   < > = values in this interval not displayed.    TUMOR MARKERS: No results for input(s): AFPTM, CEA, CA199, CHROMGRNA in the last 8760 hours.  Assessment and Plan: Metastatic carcinoma For port placement Risks and benefits of image guided port-a-catheter placement was  discussed with the patient including, but not limited to bleeding, infection, pneumothorax, or fibrin sheath development and need for additional procedures.  All of the patient's questions were answered, patient is agreeable to proceed. Consent signed and in chart.     Thank you for this interesting consult.  I greatly enjoyed meeting ASLYNN BRUNETTI and look forward to participating in their care.  A copy of this report was sent to the requesting provider on this date.  Electronically Signed: Ascencion Dike, PA-C 11/18/2020, 10:28 AM   I spent a total of 20 Minutes  in face to face in clinical consultation, greater than 50% of which was counseling/coordinating care for port placement

## 2020-11-18 NOTE — Progress Notes (Signed)
Pharmacist Chemotherapy Monitoring - Initial Assessment    Anticipated start date: 11/22/20  Regimen:  . Are orders appropriate based on the patient's diagnosis, regimen, and cycle? Yes . Does the plan date match the patient's scheduled date? Yes . Is the sequencing of drugs appropriate? Yes . Are the premedications appropriate for the patient's regimen? Yes . Prior Authorization for treatment is: Approved o If applicable, is the correct biosimilar selected based on the patient's insurance? not applicable  Organ Function and Labs: Marland Kitchen Are dose adjustments needed based on the patient's renal function, hepatic function, or hematologic function? Yes . Are appropriate labs ordered prior to the start of patient's treatment? Yes . Other organ system assessment, if indicated: N/A . The following baseline labs, if indicated, have been ordered: N/A  Dose Assessment: . Are the drug doses appropriate? Yes . Are the following correct: o Drug concentrations Yes o IV fluid compatible with drug Yes o Administration routes Yes o Timing of therapy Yes . If applicable, does the patient have documented access for treatment and/or plans for port-a-cath placement? yes . If applicable, have lifetime cumulative doses been properly documented and assessed? not applicable Lifetime Dose Tracking  No doses have been documented on this patient for the following tracked chemicals: Doxorubicin, Epirubicin, Idarubicin, Daunorubicin, Mitoxantrone, Bleomycin, Oxaliplatin, Carboplatin, Liposomal Doxorubicin  o   Toxicity Monitoring/Prevention: . The patient has the following take home antiemetics prescribed: Prochlorperazine . The patient has the following take home medications prescribed: N/A . Medication allergies and previous infusion related reactions, if applicable, have been reviewed and addressed. Yes . The patient's current medication list has been assessed for drug-drug interactions with their chemotherapy  regimen. no significant drug-drug interactions were identified on review.  Order Review: . Are the treatment plan orders signed? Yes . Is the patient scheduled to see a provider prior to their treatment? Yes  I verify that I have reviewed each item in the above checklist and answered each question accordingly.  Philomena Course 11/18/2020 12:54 PM

## 2020-11-18 NOTE — Procedures (Signed)
Interventional Radiology Procedure Note  Procedure: Single Lumen Power Port Placement    Access:  Right IJ vein.  Findings: Catheter tip positioned at SVC/RA junction. Port is ready for immediate use.   Complications: None  EBL: < 10 mL  Recommendations:  - Ok to shower in 24 hours - Do not submerge for 7 days - Routine line care   Jachai Okazaki T. Dairon Procter, M.D Pager:  [phone]   

## 2020-11-18 NOTE — Discharge Instructions (Signed)
Do not use lidocaine cream (EMLA cream) on your new port until it has healed. The petroleum in the lidocaine cream will dissolve the skin glue over your new incision.    Implanted Port Insertion, Care After This sheet gives you information about how to care for yourself after your procedure. Your health care provider may also give you more specific instructions. If you have problems or questions, contact your health care provider. What can I expect after the procedure? After the procedure, it is common to have:  Discomfort at the port insertion site.  Bruising on the skin over the port. This should improve over 3-4 days. Follow these instructions at home: [facility] care  After your port is placed, you will get a manufacturer's information card. The card has information about your port. Keep this card with you at all times.  Take care of the port as told by your health care provider. Ask your health care provider if you or a family member can get training for taking care of the port at home. A home health care nurse may also take care of the port.  Make sure to remember what type of port you have. Incision care      Follow instructions from your health care provider about how to take care of your port insertion site. Make sure you:  Leave  skin glue in place. These skin closures may need to stay in place for 2 weeks or longer.   Check your port insertion site every day for signs of infection. Check for: ? Redness, swelling, or pain. ? Fluid or blood. ? Warmth. ? Pus or a bad smell. Activity  Return to your normal activities as told by your health care provider. Ask your health care provider what activities are safe for you.  Do not lift anything that is heavier than 10 lb (4.5 kg), or the limit that you are told, until your health care provider says that it is safe. General instructions  Take over-the-counter and prescription medicines only as told by your health care  provider.  Do not take baths, swim, or use a hot tub until your health care provider approves.  You will be able to shower tomorrow around 2 PM.  Do not drive for 24 hours if you were given a sedative during your procedure.  Wear a medical alert bracelet in case of an emergency. This will tell any health care providers that you have a port.  Keep all follow-up visits as told by your health care provider. This is important. Contact a health care provider if:  You cannot flush your port with saline as directed, or you cannot draw blood from the port.  You have a fever or chills.  You have redness, swelling, or pain around your port insertion site.  You have fluid or blood coming from your port insertion site.  Your port insertion site feels warm to the touch.  You have pus or a bad smell coming from the port insertion site. Get help right away if:  You have chest pain or shortness of breath.  You have bleeding from your port that you cannot control. Summary  Take care of the port as told by your health care provider. Keep the manufacturer's information card with you at all times.  Change your dressing as told by your health care provider.  Contact a health care provider if you have a fever or chills or if you have redness, swelling, or pain around your port  insertion site.  Keep all follow-up visits as told by your health care provider. This information is not intended to replace advice given to you by your health care provider. Make sure you discuss any questions you have with your health care provider. Document Revised: 06/28/2018 Document Reviewed: 06/28/2018 Elsevier Patient Education  [place].      Moderate Conscious Sedation, Adult, Care After These instructions provide you with information about caring for yourself after your procedure. Your health care provider may also give you more specific instructions. Your treatment has been planned according to  current medical practices, but problems sometimes occur. Call your health care provider if you have any problems or questions after your procedure. What can I expect after the procedure? After your procedure, it is common:  To feel sleepy for several hours.  To feel clumsy and have poor balance for several hours.  To have poor judgment for several hours.  To vomit if you eat too soon. Follow these instructions at home: For at least 24 hours after the procedure:   Do not: ? Participate in activities where you could fall or become injured. ? Drive. ? Use heavy machinery. ? Drink alcohol. ? Take sleeping pills or medicines that cause drowsiness. ? Make important decisions or sign legal documents. ? Take care of children on your own.  Rest. Eating and drinking  Follow the diet recommended by your health care provider.  If you vomit: ? Drink water, juice, or soup when you can drink without vomiting. ? Make sure you have little or no nausea before eating solid foods. General instructions  Have a responsible adult stay with you until you are awake and alert.  Take over-the-counter and prescription medicines only as told by your health care provider.  If you smoke, do not smoke without supervision.  Keep all follow-up visits as told by your health care provider. This is important. Contact a health care provider if:  You keep feeling nauseous or you keep vomiting.  You feel light-headed.  You develop a rash.  You have a fever. Get help right away if:  You have trouble breathing. This information is not intended to replace advice given to you by your health care provider. Make sure you discuss any questions you have with your health care provider. Document Revised: 11/12/2017 Document Reviewed: 03/21/2016 Elsevier Patient Education  2020 [employer].

## 2020-11-19 ENCOUNTER — Encounter: Payer: Self-pay | Admitting: Osteopathic Medicine

## 2020-11-19 ENCOUNTER — Other Ambulatory Visit: Payer: Self-pay | Admitting: Osteopathic Medicine

## 2020-11-19 ENCOUNTER — Ambulatory Visit (INDEPENDENT_AMBULATORY_CARE_PROVIDER_SITE_OTHER): Payer: No Typology Code available for payment source | Admitting: Osteopathic Medicine

## 2020-11-19 VITALS — BP 132/67 | HR 57 | Temp 97.8°F | Wt 187.0 lb

## 2020-11-19 DIAGNOSIS — L989 Disorder of the skin and subcutaneous tissue, unspecified: Secondary | ICD-10-CM

## 2020-11-19 MED ORDER — CLONAZEPAM 0.5 MG PO TABS
0.5000 mg | ORAL_TABLET | Freq: Every day | ORAL | 2 refills | Status: DC
Start: 2020-11-19 — End: 2021-02-19

## 2020-11-19 MED FILL — clonazePAM 0.5 MG TABS: 0.5 | 30 days supply | Qty: 30 | Fill #0

## 2020-11-19 NOTE — Progress Notes (Signed)
Mary Stark is a 64 y.o. female who presents to  [place] at [facility]  today, 11/19/20, seeking care for the following:  . 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 Recent cancer diagnosis, patient is justifiable concerned.      ASSESSMENT & PLAN with other pertinent findings:  The primary encounter diagnosis was Skin lesion R lateral thigh - proximal, specimen #1 . A diagnosis of Skin lesion R lateral thigh - distal, specimen #2  was also pertinent to this visit.   Distal lesion   Proximal lesion     PRE-OP DIAGNOSIS: Abnormal Skin Lesion POST-OP DIAGNOSIS: Same  PROCEDURE: skin biopsy Performing Physician: [name]   PROCEDURE:  Shave Biopsy   The areas surrounding the skin lesions were prepared and draped in the usual sterile manner. The lesion was removed in the usual manner by the biopsy method noted above. Hemostasis was assured. The patient tolerated the procedure well.   Closure:      Silver nitrate for hemostasis   None   Followup: The patient tolerated the procedure well without complications.  Standard post-procedure care is explained and return precautions are given.   There are no Patient Instructions on file for this visit.  No orders of the defined types were placed in this encounter.   Meds ordered this encounter  Medications  . clonazePAM (KLONOPIN) 0.5 MG tablet    Sig: Take 1 tablet (0.5 mg total) by mouth at bedtime.    Dispense:  30 tablet    Refill:  2       Follow-up instructions: No follow-ups on file.                                         BP 132/67 (BP Location: Left Arm, Patient Position: Sitting, Cuff Size: Normal)   Pulse (!) 57   Temp 97.8 F (36.6 C) (Oral)   Wt 187 lb (84.8 kg)   BMI 30.18 kg/m   Current Meds  Medication Sig  .  acetaminophen (TYLENOL) 500 MG tablet Take 1,000 mg by mouth every 8 (eight) hours as needed for moderate pain.   Marland Kitchen albuterol (PROAIR HFA) 108 (90 Base) MCG/ACT inhaler INHALE 1 TO 2 PUFFS BY MOUTH INTO THE LUNGS EVERY 4 HOURS AS NEEDED FOR WHEEZING OR SHORTNESS OF BREATH (Patient taking differently: Inhale 1-2 puffs into the lungs every 4 (four) hours as needed for wheezing or shortness of breath. INHALE 1 TO 2 PUFFS BY MOUTH INTO THE LUNGS EVERY 4 HOURS AS NEEDED FOR WHEEZING OR SHORTNESS OF BREATH)  . amLODipine (NORVASC) 5 MG tablet TAKE 1 TABLET BY MOUTH ONCE DAILY (Patient taking differently: Take 5 mg by mouth every evening. )  . ARIPiprazole (ABILIFY) 5 MG tablet Take 2 tablets (10 mg total) by mouth at bedtime.  Marland Kitchen ascorbic acid (VITAMIN C) 500 MG tablet Take 1,000 mg by mouth every evening.   . ASPIRIN LOW DOSE 81 MG EC tablet TAKE 1 TABLET BY MOUTH DAILY. (Patient taking differently: Take 81 mg by mouth daily. )  . buPROPion (WELLBUTRIN XL) 300 MG 24 hr tablet Take 1 tablet (300 mg total) by mouth daily.  Marland Kitchen BYSTOLIC 20 MG TABS TAKE 1 TABLET (20 MG TOTAL) BY MOUTH DAILY. (Patient taking differently: Take 20 mg by mouth  daily. )  . Cholecalciferol (VITAMIN D) 125 MCG (5000 UT) CAPS Take 5,000 Units by mouth every evening.   . cholestyramine light (PREVALITE) 4 g packet Take 1 packet (4 g total) by mouth 2 (two) times daily.  . clonazePAM (KLONOPIN) 0.5 MG tablet Take 1 tablet (0.5 mg total) by mouth at bedtime.  . Coenzyme Q10 (CO Q-10) 200 MG CAPS Take 200 mg by mouth every evening.   . fexofenadine (ALLEGRA) 180 MG tablet Take 180 mg by mouth every evening.   . furosemide (LASIX) 20 MG tablet Take 20mg  on day 1 and 2 then every other day. (Patient taking differently: Take 20 mg by mouth as needed for edema. )  . gabapentin (NEURONTIN) 300 MG capsule 1  qam   4   qhs (Patient taking differently: Take 300-1,200 mg by mouth See admin instructions. Take 300 mg by mouth in the morning and  afternoon and 1200 mg at night)  . GLUCOSAMINE-CHONDROITIN PO Take 2 tablets by mouth every evening.   Marland Kitchen guaiFENesin-dextromethorphan (ROBITUSSIN DM) 100-10 MG/5ML syrup Take 10 mLs by mouth every 4 (four) hours as needed for cough.  . lidocaine-prilocaine (EMLA) cream Apply 1 application topically as needed.  . nitrofurantoin (MACRODANTIN) 100 MG capsule Take 1 capsule (100 mg total) by mouth at bedtime.  . Omega-3 Fatty Acids (FISH OIL) 1200 MG CAPS Take 1,200 mg by mouth every evening.   Marland Kitchen omeprazole (PRILOSEC) 20 MG capsule Take 1 capsule (20 mg total) by mouth daily. (Patient taking differently: Take 20 mg by mouth every evening. )  . ondansetron (ZOFRAN-ODT) 4 MG disintegrating tablet Take 1 tablet (4 mg total) by mouth every 8 (eight) hours as needed for nausea or vomiting.  Marland Kitchen oxyCODONE-acetaminophen (PERCOCET/ROXICET) 5-325 MG tablet Take 1-2 tablets by mouth every 6 (six) hours as needed for moderate pain or severe pain.  Marland Kitchen prochlorperazine (COMPAZINE) 10 MG tablet Take 1 tablet (10 mg total) by mouth every 6 (six) hours as needed for nausea or vomiting.  . simvastatin (ZOCOR) 20 MG tablet TAKE 1 TABLET (20 MG TOTAL) BY MOUTH DAILY. (Patient taking differently: Take 20 mg by mouth daily at 6 PM. )  . thiamine 100 MG tablet Take 1 tablet (100 mg total) by mouth daily.  . vitamin B-12 (CYANOCOBALAMIN) 1000 MCG tablet Take 2,000 mcg by mouth every evening.   . Vitamin E 180 MG (400 UNIT) CAPS Take 400 Units by mouth every evening.   . [DISCONTINUED] clonazePAM (KLONOPIN) 0.5 MG tablet Take 1 tablet (0.5 mg total) by mouth at bedtime.    No results found for this or any previous visit (from the past 72 hour(s)).  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 The patient presents for Port-A-Cath placement to begin chemotherapy. EXAM: IMPLANTED PORT A CATH PLACEMENT  WITH ULTRASOUND AND FLUOROSCOPIC GUIDANCE ANESTHESIA/SEDATION: 3.0 mg IV Versed; 100 mcg IV Fentanyl Total Moderate Sedation Time:  32 minutes The patient's level of consciousness and physiologic status were continuously monitored during the procedure by Radiology nursing. Additional Medications: 900 mg IV clindamycin. FLUOROSCOPY TIME:  24 seconds.  4.0 mGy. PROCEDURE: The procedure, risks, benefits, and alternatives were explained to the patient. Questions regarding the procedure were encouraged and answered. The patient understands and consents to the procedure. A time-out was performed prior to initiating the procedure. Ultrasound was utilized to confirm patency of the right internal jugular vein. The right  neck and chest were prepped with chlorhexidine in a sterile fashion, and a sterile drape was applied covering the operative field. Maximum barrier sterile technique with sterile gowns and gloves were used for the procedure. Local anesthesia was provided with 1% lidocaine. After creating a small venotomy incision, a 21 gauge needle was advanced into the right internal jugular vein under direct, real-time ultrasound guidance. Ultrasound image documentation was performed. After securing guidewire access, an 8 Fr dilator was placed. A J-wire was kinked to measure appropriate catheter length. A subcutaneous port pocket was then created along the upper chest wall utilizing sharp and blunt dissection. Portable cautery was utilized. The pocket was irrigated with sterile saline. A single lumen power injectable port was chosen for placement. The 8 Fr catheter was tunneled from the port pocket site to the venotomy incision. The port was placed in the pocket. External catheter was trimmed to appropriate length based on guidewire measurement. At the venotomy, an 8 Fr peel-away sheath was placed over a guidewire. The catheter was then placed through the sheath and the sheath removed. Final catheter positioning was  confirmed and documented with a fluoroscopic spot image. The port was accessed with a needle and aspirated and flushed with heparinized saline. The access needle was removed. The venotomy and port pocket incisions were closed with subcutaneous 3-0 Monocryl and subcuticular 4-0 Vicryl. Dermabond was applied to both incisions. COMPLICATIONS: COMPLICATIONS None FINDINGS: After catheter placement, the tip lies at the cavo-atrial junction. The catheter aspirates normally and is ready for immediate use. IMPRESSION: Placement of single lumen port a cath via right internal jugular vein. The catheter tip lies at the cavo-atrial junction. A power injectable port a cath was placed and is ready for immediate use. Electronically Signed   By: Aletta Edouard M.D.   On: 11/18/2020 13:40       All questions at time of visit were answered - patient instructed to contact office with any additional concerns or updates.  ER/RTC precautions were reviewed with the patient as applicable.   Please note: voice recognition software was used to produce this document, and typos may escape review. Please contact Dr. Sheppard Coil for any needed clarifications.

## 2020-11-20 ENCOUNTER — Other Ambulatory Visit: Payer: Self-pay | Admitting: Osteopathic Medicine

## 2020-11-21 ENCOUNTER — Ambulatory Visit: Payer: No Typology Code available for payment source | Admitting: Sports Medicine

## 2020-11-22 ENCOUNTER — Inpatient Hospital Stay: Payer: No Typology Code available for payment source

## 2020-11-22 ENCOUNTER — Inpatient Hospital Stay: Payer: No Typology Code available for payment source | Attending: Oncology

## 2020-11-22 ENCOUNTER — Other Ambulatory Visit: Payer: Self-pay

## 2020-11-22 VITALS — BP 158/87 | HR 68 | Temp 97.6°F | Resp 18

## 2020-11-22 DIAGNOSIS — C801 Malignant (primary) neoplasm, unspecified: Secondary | ICD-10-CM

## 2020-11-22 DIAGNOSIS — Z5111 Encounter for antineoplastic chemotherapy: Secondary | ICD-10-CM | POA: Insufficient documentation

## 2020-11-22 DIAGNOSIS — C669 Malignant neoplasm of unspecified ureter: Secondary | ICD-10-CM | POA: Diagnosis present

## 2020-11-22 DIAGNOSIS — R590 Localized enlarged lymph nodes: Secondary | ICD-10-CM | POA: Diagnosis not present

## 2020-11-22 DIAGNOSIS — D649 Anemia, unspecified: Secondary | ICD-10-CM | POA: Diagnosis not present

## 2020-11-22 DIAGNOSIS — Z95828 Presence of other vascular implants and grafts: Secondary | ICD-10-CM | POA: Insufficient documentation

## 2020-11-22 LAB — CBC WITH DIFFERENTIAL (CANCER CENTER ONLY)
Abs Immature Granulocytes: 0.04 10*3/uL (ref 0.00–0.07)
Basophils Absolute: 0.1 10*3/uL (ref 0.0–0.1)
Basophils Relative: 1 %
Eosinophils Absolute: 0.6 10*3/uL — ABNORMAL HIGH (ref 0.0–0.5)
Eosinophils Relative: 6 %
HCT: 33.3 % — ABNORMAL LOW (ref 36.0–46.0)
Hemoglobin: 10.2 g/dL — ABNORMAL LOW (ref 12.0–15.0)
Immature Granulocytes: 0 %
Lymphocytes Relative: 38 %
Lymphs Abs: 3.6 10*3/uL (ref 0.7–4.0)
MCH: 28.7 pg (ref 26.0–34.0)
MCHC: 30.6 g/dL (ref 30.0–36.0)
MCV: 93.8 fL (ref 80.0–100.0)
Monocytes Absolute: 0.8 10*3/uL (ref 0.1–1.0)
Monocytes Relative: 8 %
Neutro Abs: 4.4 10*3/uL (ref 1.7–7.7)
Neutrophils Relative %: 47 %
Platelet Count: 334 10*3/uL (ref 150–400)
RBC: 3.55 MIL/uL — ABNORMAL LOW (ref 3.87–5.11)
RDW: 16.9 % — ABNORMAL HIGH (ref 11.5–15.5)
WBC Count: 9.5 10*3/uL (ref 4.0–10.5)
nRBC: 0 % (ref 0.0–0.2)

## 2020-11-22 LAB — CMP (CANCER CENTER ONLY)
ALT: 13 U/L (ref 0–44)
AST: 16 U/L (ref 15–41)
Albumin: 3.4 g/dL — ABNORMAL LOW (ref 3.5–5.0)
Alkaline Phosphatase: 77 U/L (ref 38–126)
Anion gap: 9 (ref 5–15)
BUN: 23 mg/dL (ref 8–23)
CO2: 19 mmol/L — ABNORMAL LOW (ref 22–32)
Calcium: 9.5 mg/dL (ref 8.9–10.3)
Chloride: 109 mmol/L (ref 98–111)
Creatinine: 1.54 mg/dL — ABNORMAL HIGH (ref 0.44–1.00)
GFR, Estimated: 38 mL/min — ABNORMAL LOW (ref >60)
Glucose, Bld: 102 mg/dL — ABNORMAL HIGH (ref 70–99)
Potassium: 4.9 mmol/L (ref 3.5–5.1)
Sodium: 137 mmol/L (ref 135–145)
Total Bilirubin: <0.2 mg/dL — ABNORMAL LOW (ref 0.3–1.2)
Total Protein: 9 g/dL — ABNORMAL HIGH (ref 6.5–8.1)

## 2020-11-22 LAB — SAMPLE TO BLOOD BANK

## 2020-11-22 MED ORDER — CARBOPLATIN CHEMO INJECTION 450 MG/45ML
304.4000 mg | Freq: Once | INTRAVENOUS | Status: AC
  Administered 2020-11-22: 300 mg via INTRAVENOUS
  Filled 2020-11-22: qty 30

## 2020-11-22 MED ORDER — HEPARIN SOD (PORK) LOCK FLUSH 100 UNIT/ML IV SOLN
500.0000 [IU] | Freq: Once | INTRAVENOUS | Status: AC | PRN
Start: 2020-11-22 — End: 2020-11-22
  Administered 2020-11-22: 500 [IU]
  Filled 2020-11-22: qty 5

## 2020-11-22 MED ORDER — SODIUM CHLORIDE 0.9% FLUSH
10.0000 mL | INTRAVENOUS | Status: DC | PRN
  Administered 2020-11-22: 10 mL
  Filled 2020-11-22: qty 10

## 2020-11-22 MED ORDER — SODIUM CHLORIDE 0.9% FLUSH
10.0000 mL | Freq: Once | INTRAVENOUS | Status: AC
  Administered 2020-11-22: 10 mL
  Filled 2020-11-22: qty 10

## 2020-11-22 MED ORDER — PALONOSETRON HCL INJECTION 0.25 MG/5ML
0.2500 mg | Freq: Once | INTRAVENOUS | Status: AC
  Administered 2020-11-22: 0.25 mg via INTRAVENOUS

## 2020-11-22 MED ORDER — SODIUM CHLORIDE 0.9 % IV SOLN
1000.0000 mg/m2 | Freq: Once | INTRAVENOUS | Status: AC
  Administered 2020-11-22: 2014 mg via INTRAVENOUS
  Filled 2020-11-22: qty 52.97

## 2020-11-22 MED ORDER — SODIUM CHLORIDE 0.9 % IV SOLN
Freq: Once | INTRAVENOUS | Status: DC
  Filled 2020-11-22: qty 250

## 2020-11-22 MED ORDER — SODIUM CHLORIDE 0.9 % IV SOLN
150.0000 mg | Freq: Once | INTRAVENOUS | Status: AC
  Administered 2020-11-22: 150 mg via INTRAVENOUS
  Filled 2020-11-22: qty 150

## 2020-11-22 MED ORDER — PALONOSETRON HCL INJECTION 0.25 MG/5ML
INTRAVENOUS | Status: AC
  Filled 2020-11-22: qty 5

## 2020-11-22 MED ORDER — SODIUM CHLORIDE 0.9 % IV SOLN
10.0000 mg | Freq: Once | INTRAVENOUS | Status: AC
  Administered 2020-11-22: 10 mg via INTRAVENOUS
  Filled 2020-11-22: qty 10

## 2020-11-22 NOTE — Progress Notes (Signed)
Per Dr. Alen Blew, ok to treat with Creatinine 1.54  Pt discharged in stable condition

## 2020-11-22 NOTE — Patient Instructions (Signed)
[place] Discharge Instructions for Patients Receiving Chemotherapy  Today you received the following chemotherapy agents carboplatin, gemcitabine.  To help prevent nausea and vomiting after your treatment, we encourage you to take your nausea medication as directed.    If you develop nausea and vomiting that is not controlled by your nausea medication, call the clinic.   BELOW ARE SYMPTOMS THAT SHOULD BE REPORTED IMMEDIATELY:  *FEVER GREATER THAN 100.5 F  *CHILLS WITH OR WITHOUT FEVER  NAUSEA AND VOMITING THAT IS NOT CONTROLLED WITH YOUR NAUSEA MEDICATION  *UNUSUAL SHORTNESS OF BREATH  *UNUSUAL BRUISING OR BLEEDING  TENDERNESS IN MOUTH AND THROAT WITH OR WITHOUT PRESENCE OF ULCERS  *URINARY PROBLEMS  *BOWEL PROBLEMS  UNUSUAL RASH Items with * indicate a potential emergency and should be followed up as soon as possible.  Feel free to call the clinic should you have any questions or concerns. The clinic phone number is [phone].  Please show the [place] at check-in to the Emergency Department and triage nurse.

## 2020-11-25 ENCOUNTER — Encounter (HOSPITAL_COMMUNITY): Payer: Self-pay | Admitting: Oncology

## 2020-11-25 LAB — IRON AND TIBC
Iron: 47 ug/dL (ref 41–142)
Saturation Ratios: 14 % — ABNORMAL LOW (ref 21–57)
TIBC: 336 ug/dL (ref 236–444)
UIBC: 289 ug/dL (ref 120–384)

## 2020-11-25 LAB — FERRITIN: Ferritin: 58 ng/mL (ref 11–307)

## 2020-11-27 ENCOUNTER — Other Ambulatory Visit: Payer: Self-pay

## 2020-11-27 ENCOUNTER — Ambulatory Visit (INDEPENDENT_AMBULATORY_CARE_PROVIDER_SITE_OTHER): Payer: No Typology Code available for payment source | Admitting: Sports Medicine

## 2020-11-27 ENCOUNTER — Ambulatory Visit (INDEPENDENT_AMBULATORY_CARE_PROVIDER_SITE_OTHER): Payer: No Typology Code available for payment source

## 2020-11-27 DIAGNOSIS — M25561 Pain in right knee: Secondary | ICD-10-CM

## 2020-11-27 DIAGNOSIS — M1712 Unilateral primary osteoarthritis, left knee: Secondary | ICD-10-CM | POA: Diagnosis not present

## 2020-11-27 DIAGNOSIS — M7542 Impingement syndrome of left shoulder: Secondary | ICD-10-CM | POA: Diagnosis not present

## 2020-11-27 MED ORDER — OXYCODONE-ACETAMINOPHEN 10-325 MG PO TABS: 1.0000 | ORAL_TABLET | Freq: Three times a day (TID) | ORAL | 0 refills | Status: DC | PRN

## 2020-11-27 NOTE — Assessment & Plan Note (Addendum)
Left shoulder impingement syndrome with all the impingement signs. X-rays, increasing her oxycodone to 10 mg 3 times daily Avoiding NSAIDs due to her renal function. Home rehab exercises given, Thera-Band given, return to see me in a month, injection if no better.

## 2020-11-27 NOTE — Assessment & Plan Note (Signed)
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 We will treat her aggressively, hydrocodone, home rehab exercises, updated x-rays, return to see me in a month, injection if no better.

## 2020-11-27 NOTE — Progress Notes (Signed)
    Procedures performed today:    None.  Independent interpretation of notes and tests performed by another provider:   None.  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 We will treat her aggressively, hydrocodone, home rehab exercises, updated x-rays, return to see me in a month, injection if no better.  Impingement syndrome, shoulder, left Left shoulder impingement syndrome with all the impingement signs. X-rays, increasing her oxycodone to 10 mg 3 times daily Avoiding NSAIDs due to her renal function. Home rehab exercises given, Thera-Band given, return to see me in a month, injection if no better.    ___________________________________________ Gwen Her. Dianah Field, M.D., ABFM., CAQSM. Primary Care and [place] Instructor of [employer] [facility] [employer]

## 2020-11-28 ENCOUNTER — Other Ambulatory Visit: Payer: Self-pay | Admitting: Osteopathic Medicine

## 2020-11-28 MED ORDER — ARIPIPRAZOLE (SENSOR) 10 MG PO TABS: 10.0000 mg | ORAL_TABLET | Freq: Every day | ORAL | 1 refills | Status: DC

## 2020-11-28 MED FILL — ARIPIPRAZOLE 10 MG TABS: 10 | 90 days supply | Qty: 90 | Fill #0

## 2020-11-29 ENCOUNTER — Inpatient Hospital Stay: Payer: No Typology Code available for payment source

## 2020-11-29 ENCOUNTER — Encounter: Payer: Self-pay | Admitting: Oncology

## 2020-11-29 ENCOUNTER — Other Ambulatory Visit: Payer: Self-pay

## 2020-11-29 VITALS — BP 147/90 | HR 62 | Temp 97.8°F | Resp 18

## 2020-11-29 DIAGNOSIS — C801 Malignant (primary) neoplasm, unspecified: Secondary | ICD-10-CM

## 2020-11-29 DIAGNOSIS — Z5111 Encounter for antineoplastic chemotherapy: Secondary | ICD-10-CM | POA: Diagnosis not present

## 2020-11-29 DIAGNOSIS — C669 Malignant neoplasm of unspecified ureter: Secondary | ICD-10-CM

## 2020-11-29 LAB — CBC WITH DIFFERENTIAL (CANCER CENTER ONLY)
Abs Immature Granulocytes: 0.01 10*3/uL (ref 0.00–0.07)
Basophils Absolute: 0.1 10*3/uL (ref 0.0–0.1)
Basophils Relative: 1 %
Eosinophils Absolute: 0.2 10*3/uL (ref 0.0–0.5)
Eosinophils Relative: 4 %
HCT: 31.3 % — ABNORMAL LOW (ref 36.0–46.0)
Hemoglobin: 9.6 g/dL — ABNORMAL LOW (ref 12.0–15.0)
Immature Granulocytes: 0 %
Lymphocytes Relative: 49 %
Lymphs Abs: 2.1 10*3/uL (ref 0.7–4.0)
MCH: 28.4 pg (ref 26.0–34.0)
MCHC: 30.7 g/dL (ref 30.0–36.0)
MCV: 92.6 fL (ref 80.0–100.0)
Monocytes Absolute: 0.4 10*3/uL (ref 0.1–1.0)
Monocytes Relative: 10 %
Neutro Abs: 1.6 10*3/uL — ABNORMAL LOW (ref 1.7–7.7)
Neutrophils Relative %: 36 %
Platelet Count: 220 10*3/uL (ref 150–400)
RBC: 3.38 MIL/uL — ABNORMAL LOW (ref 3.87–5.11)
RDW: 15.7 % — ABNORMAL HIGH (ref 11.5–15.5)
WBC Count: 4.3 10*3/uL (ref 4.0–10.5)
nRBC: 0 % (ref 0.0–0.2)

## 2020-11-29 LAB — CMP (CANCER CENTER ONLY)
ALT: 21 U/L (ref 0–44)
AST: 21 U/L (ref 15–41)
Albumin: 3.5 g/dL (ref 3.5–5.0)
Alkaline Phosphatase: 76 U/L (ref 38–126)
Anion gap: 7 (ref 5–15)
BUN: 23 mg/dL (ref 8–23)
CO2: 23 mmol/L (ref 22–32)
Calcium: 9.4 mg/dL (ref 8.9–10.3)
Chloride: 106 mmol/L (ref 98–111)
Creatinine: 1.13 mg/dL — ABNORMAL HIGH (ref 0.44–1.00)
GFR, Estimated: 55 mL/min — ABNORMAL LOW (ref >60)
Glucose, Bld: 97 mg/dL (ref 70–99)
Potassium: 4.3 mmol/L (ref 3.5–5.1)
Sodium: 136 mmol/L (ref 135–145)
Total Bilirubin: 0.2 mg/dL — ABNORMAL LOW (ref 0.3–1.2)
Total Protein: 8.7 g/dL — ABNORMAL HIGH (ref 6.5–8.1)

## 2020-11-29 LAB — SAMPLE TO BLOOD BANK

## 2020-11-29 MED ORDER — SODIUM CHLORIDE 0.9% FLUSH
10.0000 mL | INTRAVENOUS | Status: DC | PRN
  Administered 2020-11-29: 10 mL
  Filled 2020-11-29: qty 10

## 2020-11-29 MED ORDER — PALONOSETRON HCL INJECTION 0.25 MG/5ML
INTRAVENOUS | Status: AC
  Filled 2020-11-29: qty 5

## 2020-11-29 MED ORDER — SODIUM CHLORIDE 0.9 % IV SOLN
Freq: Once | INTRAVENOUS | Status: AC
  Filled 2020-11-29: qty 250

## 2020-11-29 MED ORDER — SODIUM CHLORIDE 0.9 % IV SOLN
1000.0000 mg/m2 | Freq: Once | INTRAVENOUS | Status: AC
  Administered 2020-11-29: 2014 mg via INTRAVENOUS
  Filled 2020-11-29: qty 52.97

## 2020-11-29 MED ORDER — PROCHLORPERAZINE MALEATE 10 MG PO TABS: 10.0000 mg | ORAL_TABLET | Freq: Once | ORAL | Status: DC

## 2020-11-29 MED ORDER — HEPARIN SOD (PORK) LOCK FLUSH 100 UNIT/ML IV SOLN
500.0000 [IU] | Freq: Once | INTRAVENOUS | Status: AC | PRN
  Administered 2020-11-29: 500 [IU]
  Filled 2020-11-29: qty 5

## 2020-11-29 MED ORDER — PALONOSETRON HCL INJECTION 0.25 MG/5ML
0.2500 mg | Freq: Once | INTRAVENOUS | Status: AC
  Administered 2020-11-29: 0.25 mg via INTRAVENOUS

## 2020-11-29 MED FILL — GABAPENTIN 300 MG CAPSULE: 300 | 30 days supply | Qty: 180 | Fill #3

## 2020-11-29 NOTE — Patient Instructions (Signed)
[facility] °Discharge Instructions for Patients Receiving Chemotherapy ° °Today you received the following chemotherapy agents Gemzar ° °To help prevent nausea and vomiting after your treatment, we encourage you to take your nausea medication as directed. °  °If you develop nausea and vomiting that is not controlled by your nausea medication, call the clinic.  ° °BELOW ARE SYMPTOMS THAT SHOULD BE REPORTED IMMEDIATELY: °· *FEVER GREATER THAN 100.5 F °· *CHILLS WITH OR WITHOUT FEVER °· NAUSEA AND VOMITING THAT IS NOT CONTROLLED WITH YOUR NAUSEA MEDICATION °· *UNUSUAL SHORTNESS OF BREATH °· *UNUSUAL BRUISING OR BLEEDING °· TENDERNESS IN MOUTH AND THROAT WITH OR WITHOUT PRESENCE OF ULCERS °· *URINARY PROBLEMS °· *BOWEL PROBLEMS °· UNUSUAL RASH °Items with * indicate a potential emergency and should be followed up as soon as possible. ° °Feel free to call the clinic should you have any questions or concerns. The clinic phone number is [phone]. ° °Please show the CHEMO ALERT CARD at check-in to the Emergency Department and triage nurse. ° ° °

## 2020-11-29 NOTE — Progress Notes (Signed)
Met with patient at registration to introduce myself as Financial Resource Specialist and to offer available resources.  Discussed one-time $1000 [employer] grant and qualifications to assist with personal expenses while going through treatment.  Gave her my card if interested in applying and for any additional financial questions or concerns.  

## 2020-11-29 NOTE — Progress Notes (Signed)
Per MD Alen Blew, ok to proceed with CBC only today. Per Shadad, give IV Aloxi instead of PO compazine today

## 2020-12-03 ENCOUNTER — Ambulatory Visit: Payer: No Typology Code available for payment source

## 2020-12-05 ENCOUNTER — Other Ambulatory Visit: Payer: No Typology Code available for payment source

## 2020-12-05 ENCOUNTER — Ambulatory Visit: Payer: No Typology Code available for payment source | Admitting: Oncology

## 2020-12-12 ENCOUNTER — Other Ambulatory Visit: Payer: No Typology Code available for payment source

## 2020-12-12 ENCOUNTER — Inpatient Hospital Stay: Payer: No Typology Code available for payment source

## 2020-12-12 ENCOUNTER — Ambulatory Visit: Payer: No Typology Code available for payment source

## 2020-12-12 ENCOUNTER — Inpatient Hospital Stay (HOSPITAL_BASED_OUTPATIENT_CLINIC_OR_DEPARTMENT_OTHER): Payer: No Typology Code available for payment source | Admitting: Oncology

## 2020-12-12 ENCOUNTER — Other Ambulatory Visit: Payer: Self-pay

## 2020-12-12 VITALS — BP 143/85 | HR 78 | Temp 97.8°F | Resp 18 | Ht 66.0 in | Wt 192.4 lb

## 2020-12-12 DIAGNOSIS — Z95828 Presence of other vascular implants and grafts: Secondary | ICD-10-CM

## 2020-12-12 DIAGNOSIS — Z5111 Encounter for antineoplastic chemotherapy: Secondary | ICD-10-CM | POA: Diagnosis not present

## 2020-12-12 DIAGNOSIS — Z23 Encounter for immunization: Secondary | ICD-10-CM

## 2020-12-12 DIAGNOSIS — C669 Malignant neoplasm of unspecified ureter: Secondary | ICD-10-CM

## 2020-12-12 DIAGNOSIS — C801 Malignant (primary) neoplasm, unspecified: Secondary | ICD-10-CM | POA: Diagnosis not present

## 2020-12-12 LAB — CMP (CANCER CENTER ONLY)
ALT: 15 U/L (ref 0–44)
AST: 20 U/L (ref 15–41)
Albumin: 3.6 g/dL (ref 3.5–5.0)
Alkaline Phosphatase: 69 U/L (ref 38–126)
Anion gap: 5 (ref 5–15)
BUN: 18 mg/dL (ref 8–23)
CO2: 26 mmol/L (ref 22–32)
Calcium: 9.3 mg/dL (ref 8.9–10.3)
Chloride: 108 mmol/L (ref 98–111)
Creatinine: 1.28 mg/dL — ABNORMAL HIGH (ref 0.44–1.00)
GFR, Estimated: 47 mL/min — ABNORMAL LOW (ref >60)
Glucose, Bld: 97 mg/dL (ref 70–99)
Potassium: 4.7 mmol/L (ref 3.5–5.1)
Sodium: 139 mmol/L (ref 135–145)
Total Bilirubin: <0.2 mg/dL — ABNORMAL LOW (ref 0.3–1.2)
Total Protein: 8 g/dL (ref 6.5–8.1)

## 2020-12-12 LAB — CBC WITH DIFFERENTIAL (CANCER CENTER ONLY)
Abs Immature Granulocytes: 0.01 10*3/uL (ref 0.00–0.07)
Basophils Absolute: 0 10*3/uL (ref 0.0–0.1)
Basophils Relative: 1 %
Eosinophils Absolute: 0.1 10*3/uL (ref 0.0–0.5)
Eosinophils Relative: 3 %
HCT: 30.6 % — ABNORMAL LOW (ref 36.0–46.0)
Hemoglobin: 9.4 g/dL — ABNORMAL LOW (ref 12.0–15.0)
Immature Granulocytes: 0 %
Lymphocytes Relative: 46 %
Lymphs Abs: 2.3 10*3/uL (ref 0.7–4.0)
MCH: 28.7 pg (ref 26.0–34.0)
MCHC: 30.7 g/dL (ref 30.0–36.0)
MCV: 93.6 fL (ref 80.0–100.0)
Monocytes Absolute: 0.8 10*3/uL (ref 0.1–1.0)
Monocytes Relative: 15 %
Neutro Abs: 1.7 10*3/uL (ref 1.7–7.7)
Neutrophils Relative %: 35 %
Platelet Count: 397 10*3/uL (ref 150–400)
RBC: 3.27 MIL/uL — ABNORMAL LOW (ref 3.87–5.11)
RDW: 16.6 % — ABNORMAL HIGH (ref 11.5–15.5)
WBC Count: 5 10*3/uL (ref 4.0–10.5)
nRBC: 0 % (ref 0.0–0.2)

## 2020-12-12 LAB — SAMPLE TO BLOOD BANK

## 2020-12-12 MED ORDER — SODIUM CHLORIDE 0.9 % IV SOLN
Freq: Once | INTRAVENOUS | Status: AC
  Filled 2020-12-12: qty 250

## 2020-12-12 MED ORDER — SODIUM CHLORIDE 0.9 % IV SOLN
10.0000 mg | Freq: Once | INTRAVENOUS | Status: AC
  Administered 2020-12-12: 10 mg via INTRAVENOUS
  Filled 2020-12-12: qty 10

## 2020-12-12 MED ORDER — SODIUM CHLORIDE 0.9 % IV SOLN
1000.0000 mg/m2 | Freq: Once | INTRAVENOUS | Status: AC
  Administered 2020-12-12: 2014 mg via INTRAVENOUS
  Filled 2020-12-12: qty 52.97

## 2020-12-12 MED ORDER — SODIUM CHLORIDE 0.9% FLUSH
10.0000 mL | INTRAVENOUS | Status: DC | PRN
  Administered 2020-12-12: 10 mL
  Filled 2020-12-12: qty 10

## 2020-12-12 MED ORDER — SODIUM CHLORIDE 0.9% FLUSH
10.0000 mL | Freq: Once | INTRAVENOUS | Status: AC
  Administered 2020-12-12: 10 mL
  Filled 2020-12-12: qty 10

## 2020-12-12 MED ORDER — PALONOSETRON HCL INJECTION 0.25 MG/5ML
INTRAVENOUS | Status: AC
  Filled 2020-12-12: qty 5

## 2020-12-12 MED ORDER — HEPARIN SOD (PORK) LOCK FLUSH 100 UNIT/ML IV SOLN
500.0000 [IU] | Freq: Once | INTRAVENOUS | Status: AC | PRN
Start: 2020-12-12 — End: 2020-12-12
  Administered 2020-12-12: 500 [IU]
  Filled 2020-12-12: qty 5

## 2020-12-12 MED ORDER — SODIUM CHLORIDE 0.9 % IV SOLN
346.0000 mg | Freq: Once | INTRAVENOUS | Status: AC
Start: 2020-12-12 — End: 2020-12-12
  Administered 2020-12-12: 350 mg via INTRAVENOUS
  Filled 2020-12-12: qty 35

## 2020-12-12 MED ORDER — PALONOSETRON HCL INJECTION 0.25 MG/5ML
0.2500 mg | Freq: Once | INTRAVENOUS | Status: AC
  Administered 2020-12-12: 0.25 mg via INTRAVENOUS

## 2020-12-12 MED ORDER — FOSAPREPITANT DIMEGLUMINE INJECTION 150 MG
150.0000 mg | Freq: Once | INTRAVENOUS | Status: AC
  Administered 2020-12-12: 150 mg via INTRAVENOUS
  Filled 2020-12-12: qty 150

## 2020-12-12 NOTE — Progress Notes (Signed)
Patient received COVID booster injection today. Observed for 15 minutes post injection without any complain or concern.

## 2020-12-12 NOTE — Patient Instructions (Signed)
[facility] Discharge Instructions for Patients Receiving Chemotherapy  Today you received the following chemotherapy agents Gemcitabine (GEMZAR) & Carboplatin (PARAPLATIN).  To help prevent nausea and vomiting after your treatment, we encourage you to take your nausea medication as prescribed.   If you develop nausea and vomiting that is not controlled by your nausea medication, call the clinic.   BELOW ARE SYMPTOMS THAT SHOULD BE REPORTED IMMEDIATELY:  *FEVER GREATER THAN 100.5 F  *CHILLS WITH OR WITHOUT FEVER  NAUSEA AND VOMITING THAT IS NOT CONTROLLED WITH YOUR NAUSEA MEDICATION  *UNUSUAL SHORTNESS OF BREATH  *UNUSUAL BRUISING OR BLEEDING  TENDERNESS IN MOUTH AND THROAT WITH OR WITHOUT PRESENCE OF ULCERS  *URINARY PROBLEMS  *BOWEL PROBLEMS  UNUSUAL RASH Items with * indicate a potential emergency and should be followed up as soon as possible.  Feel free to call the clinic should you have any questions or concerns. The clinic phone number is [phone].  Please show the CHEMO ALERT CARD at check-in to the Emergency Department and triage nurse.   

## 2020-12-12 NOTE — Progress Notes (Signed)
Hematology and Oncology Follow Up Visit  Mary Stark [id number] 1956/09/27 64 y.o. 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  She was found to have retroperitoneal or pelvic adenopathy.   Prior Therapy:  She is status post retroperitoneal lymph node biopsy completed on 10/24/2020 which showed metastatic carcinoma.  Current therapy: Chemotherapy utilizing carboplatin and gemcitabine started on November 22, 2020.  She is here for day 1 of cycle 2.   Interim History: Mary Stark returns today for a follow-up visit.  Since the last visit, she completed the first cycle of chemotherapy without any major complications.  She has reports of fatigue and tiredness but no nausea or vomiting.  She denies any flank pain or discomfort.  She has reported intermittent hematuria.  She denies any dysuria or fevers.  She denies any chills or sweats.  She is eating well at this time.     Medications: I have reviewed the patient's current medications.  Current Outpatient Medications  Medication Sig Dispense Refill  . acetaminophen (TYLENOL) 500 MG tablet Take 1,000 mg by mouth every 8 (eight) hours as needed for moderate pain.     Marland Kitchen albuterol (PROAIR HFA) 108 (90 Base) MCG/ACT inhaler INHALE 1 TO 2 PUFFS BY MOUTH INTO THE LUNGS EVERY 4 HOURS AS NEEDED FOR WHEEZING OR SHORTNESS OF BREATH (Patient taking differently: Inhale 1-2 puffs into the lungs every 4 (four) hours as needed for wheezing or shortness of breath. INHALE 1 TO 2 PUFFS BY MOUTH INTO THE LUNGS EVERY 4 HOURS AS NEEDED FOR WHEEZING OR SHORTNESS OF BREATH) 8.5 g 1  . amLODipine (NORVASC) 5 MG tablet TAKE 1 TABLET BY MOUTH ONCE DAILY (Patient taking differently: Take 5 mg by mouth every evening. ) 90 tablet 1  . ARIPiprazole 10 MG TABS Take 10 mg by mouth daily. 90 tablet 1  . ascorbic acid (VITAMIN C) 500  MG tablet Take 1,000 mg by mouth every evening.     . ASPIRIN LOW DOSE 81 MG EC tablet TAKE 1 TABLET BY MOUTH DAILY. (Patient taking differently: Take 81 mg by mouth daily. ) 90 tablet 3  . buPROPion (WELLBUTRIN XL) 300 MG 24 hr tablet Take 1 tablet (300 mg total) by mouth daily. 90 tablet 1  . BYSTOLIC 20 MG TABS TAKE 1 TABLET (20 MG TOTAL) BY MOUTH DAILY. (Patient taking differently: Take 20 mg by mouth daily. ) 90 tablet 1  . Cholecalciferol (VITAMIN D) 125 MCG (5000 UT) CAPS Take 5,000 Units by mouth every evening.     . cholestyramine light (PREVALITE) 4 g packet Take 1 packet (4 g total) by mouth 2 (two) times daily. 60 each 1  . clonazePAM (KLONOPIN) 0.5 MG tablet Take 1 tablet (0.5 mg total) by mouth at bedtime. 30 tablet 2  . Coenzyme Q10 (CO Q-10) 200 MG CAPS Take 200 mg by mouth every evening.     . DULoxetine (CYMBALTA) 30 MG capsule Take 30 mg by mouth daily.  (Patient not taking: Reported on 11/13/2020)    . fexofenadine (ALLEGRA) 180 MG tablet Take 180 mg by mouth every evening.     . furosemide (LASIX) 20 MG tablet Take 20mg  on day 1 and 2 then every other day. (Patient taking differently: Take 20 mg by mouth as needed for edema. ) 30 tablet 3  . gabapentin (NEURONTIN) 300 MG capsule 1  qam  4   qhs (Patient taking differently: Take 300-1,200 mg by mouth See admin instructions. Take 300 mg by mouth in the morning and afternoon and 1200 mg at night) 150 capsule 5  . GLUCOSAMINE-CHONDROITIN PO Take 2 tablets by mouth every evening.     Marland Kitchen guaiFENesin-dextromethorphan (ROBITUSSIN DM) 100-10 MG/5ML syrup Take 10 mLs by mouth every 4 (four) hours as needed for cough. 473 mL 0  . lidocaine-prilocaine (EMLA) cream Apply 1 application topically as needed. 30 g 0  . nitrofurantoin (MACRODANTIN) 100 MG capsule Take 1 capsule (100 mg total) by mouth at bedtime. 90 capsule 1  . Omega-3 Fatty Acids (FISH OIL) 1200 MG CAPS Take 1,200 mg by mouth every evening.     Marland Kitchen omeprazole (PRILOSEC) 20 MG  capsule Take 1 capsule (20 mg total) by mouth daily. (Patient taking differently: Take 20 mg by mouth every evening. ) 90 capsule 1  . ondansetron (ZOFRAN-ODT) 4 MG disintegrating tablet Take 1 tablet (4 mg total) by mouth every 8 (eight) hours as needed for nausea or vomiting. 12 tablet 0  . oxyCODONE-acetaminophen (PERCOCET) 10-325 MG tablet Take 1 tablet by mouth every 8 (eight) hours as needed for pain. 90 tablet 0  . prochlorperazine (COMPAZINE) 10 MG tablet Take 1 tablet (10 mg total) by mouth every 6 (six) hours as needed for nausea or vomiting. 30 tablet 0  . simvastatin (ZOCOR) 20 MG tablet TAKE 1 TABLET (20 MG TOTAL) BY MOUTH DAILY. (Patient taking differently: Take 20 mg by mouth daily at 6 PM. ) 90 tablet 1  . tamsulosin (FLOMAX) 0.4 MG CAPS capsule Take 0.4 mg by mouth at bedtime. (Patient not taking: Reported on 11/13/2020)    . thiamine 100 MG tablet Take 1 tablet (100 mg total) by mouth daily. 30 tablet 0  . vitamin B-12 (CYANOCOBALAMIN) 1000 MCG tablet Take 2,000 mcg by mouth every evening.     . Vitamin E 180 MG (400 UNIT) CAPS Take 400 Units by mouth every evening.      No current facility-administered medications for this visit.     Allergies:  Allergies  Allergen Reactions  . Metformin And Related Other (See Comments)    Lactic acidosis   . Celebrex [Celecoxib] Other (See Comments)    Dizziness, "makes me feel bad"  . Penicillins Hives and Rash    Reports hives to penicillin and amoxicillin as an adult "a long time ago." Has tolerated cefdinir (09/2020) and cefepime (10/2020) before.      Physical Exam: Blood pressure (!) 143/85, pulse 78, temperature 97.8 F (36.6 C), temperature source Tympanic, resp. rate 18, height 5\' 6"  (1.676 m), weight 192 lb 6.4 oz (87.3 kg), SpO2 97 %.   ECOG: 1   General appearance: Comfortable appearing without any discomfort Head: Normocephalic without any trauma Oropharynx: Mucous membranes are moist and pink without any thrush or  ulcers. Eyes: Pupils are equal and round reactive to light. Lymph nodes: No cervical, supraclavicular, inguinal or axillary lymphadenopathy.   Heart:regular rate and rhythm.  S1 and S2 without leg edema. Lung: Clear without any rhonchi or wheezes.  No dullness to percussion. Abdomin: Soft, nontender, nondistended with good bowel sounds.  No hepatosplenomegaly. Musculoskeletal: No joint deformity or effusion.  Full range of motion noted. Neurological: No deficits noted on motor, sensory and deep tendon reflex exam. Skin: No petechial rash or dryness.  Appeared moist.      Lab Results: Lab Results  Component Value Date   WBC 4.3 11/29/2020  HGB 9.6 (L) 11/29/2020   HCT 31.3 (L) 11/29/2020   MCV 92.6 11/29/2020   PLT 220 11/29/2020     Chemistry      Component Value Date/Time   NA 136 11/29/2020 1028   NA 142 04/26/2018 1333   K 4.3 11/29/2020 1028   CL 106 11/29/2020 1028   CO2 23 11/29/2020 1028   BUN 23 11/29/2020 1028   BUN 22 04/26/2018 1333   CREATININE 1.13 (H) 11/29/2020 1028   CREATININE 0.83 09/22/2020 1330   GLU 87 02/07/2013 0000      Component Value Date/Time   CALCIUM 9.4 11/29/2020 1028   ALKPHOS 76 11/29/2020 1028   AST 21 11/29/2020 1028   ALT 21 11/29/2020 1028   BILITOT 0.2 (L) 11/29/2020 1028       Radiological Studies: IMPRESSION: 1. Bilateral reticular densities are noted throughout both lungs consistent with a history of prior multifocal pneumonia due to COVID-19. 2. Small left pleural effusion is noted. 3. Bilateral ureteral stents are noted in grossly good position. Stable mild bilateral hydronephrosis is noted. The intrarenal collecting system and right pelvis on the right remains hyperdense which is abnormal and concerning for possible hemorrhage. Stable mild inflammatory changes are seen around the urinary bladder concerning for possible cystitis. 4. Stable extensive retroperitoneal and periaortic adenopathy is noted consistent with  metastatic disease. 5. Stable presacral stranding is noted consistent with inflammation.  Impression and Plan:    64 year old woman with:  1.    Stage IV carcinoma arising from a GU tract presented with retroperitoneal and pelvic lymphadenopathy  in November 2021.    The natural course of this disease was updated today and treatment options were reviewed.  Risks and benefits of continuing current treatment with gemcitabine and carboplatin were discussed.  Complications that include nausea, vomiting, suppression, neutropenia and sepsis were reiterated.  Next generation sequencing of her tissue was not possible is of insufficient material.  This could be attempted again in the future with a repeat biopsy.  After discussion today, she is agreeable to proceed.  Laboratory data reviewed and her parameters are safe to proceed at this time.   2. IV access:Port-A-Cath inserted without any complications.  3. Antiemetics: No nausea or vomiting reported.  Compazine is available to her.  4. Renal function surveillance:  Her creatinine clearance of fluctuated between 40 to 50 cc/min.  We will continue to monitor on platinum therapy.  5. Goals of care:  Her disease appears to be incurable although aggressive measures are warranted at this time.  6.  Anemia: Hemoglobin have been stable with iron studies do not show any severe deficiency.  We will continue to monitor and supplement as needed.  7. Follow-up: In 1 week for day 8 of cycle 2 and 3 weeks for the start of cycle 3.   30  minutes were spent on this encounter.  Time was dedicated to reviewing her disease status, addressing complications related to her cancer and cancer therapy and future plan of care discussion.   Zola Button, MD 12/30/202112:25 PM

## 2020-12-17 ENCOUNTER — Other Ambulatory Visit: Payer: Self-pay | Admitting: Osteopathic Medicine

## 2020-12-17 DIAGNOSIS — Z791 Long term (current) use of non-steroidal anti-inflammatories (NSAID): Secondary | ICD-10-CM

## 2020-12-17 DIAGNOSIS — I10 Essential (primary) hypertension: Secondary | ICD-10-CM

## 2020-12-17 MED FILL — ASPIRIN 81 MG TBEC: 81 | 90 days supply | Qty: 90 | Fill #0

## 2020-12-17 MED FILL — clonazePAM 0.5 MG TABS: 0.5 | 30 days supply | Qty: 30 | Fill #0

## 2020-12-17 MED FILL — buPROPion HCL ER (XL) 300 M: 300 | 30 days supply | Qty: 30 | Fill #0

## 2020-12-18 ENCOUNTER — Other Ambulatory Visit: Payer: Self-pay | Admitting: Osteopathic Medicine

## 2020-12-18 MED FILL — AMLODIPINE BESYLATE 5 MG TA: 5 | 90 days supply | Qty: 90 | Fill #0

## 2020-12-18 MED FILL — OMEPRAZOLE 20 MG CAP: 20 | 90 days supply | Qty: 90 | Fill #0

## 2020-12-19 ENCOUNTER — Inpatient Hospital Stay: Payer: No Typology Code available for payment source | Attending: Oncology

## 2020-12-19 ENCOUNTER — Inpatient Hospital Stay: Payer: No Typology Code available for payment source

## 2020-12-19 ENCOUNTER — Other Ambulatory Visit: Payer: Self-pay

## 2020-12-19 VITALS — BP 146/82 | HR 66 | Temp 97.7°F | Resp 16

## 2020-12-19 DIAGNOSIS — C669 Malignant neoplasm of unspecified ureter: Secondary | ICD-10-CM | POA: Diagnosis present

## 2020-12-19 DIAGNOSIS — Z95828 Presence of other vascular implants and grafts: Secondary | ICD-10-CM

## 2020-12-19 DIAGNOSIS — C801 Malignant (primary) neoplasm, unspecified: Secondary | ICD-10-CM

## 2020-12-19 DIAGNOSIS — D630 Anemia in neoplastic disease: Secondary | ICD-10-CM | POA: Insufficient documentation

## 2020-12-19 DIAGNOSIS — Z5111 Encounter for antineoplastic chemotherapy: Secondary | ICD-10-CM | POA: Insufficient documentation

## 2020-12-19 DIAGNOSIS — R590 Localized enlarged lymph nodes: Secondary | ICD-10-CM | POA: Diagnosis not present

## 2020-12-19 LAB — CMP (CANCER CENTER ONLY)
ALT: 18 U/L (ref 0–44)
AST: 23 U/L (ref 15–41)
Albumin: 3.7 g/dL (ref 3.5–5.0)
Alkaline Phosphatase: 81 U/L (ref 38–126)
Anion gap: 9 (ref 5–15)
BUN: 19 mg/dL (ref 8–23)
CO2: 21 mmol/L — ABNORMAL LOW (ref 22–32)
Calcium: 9.3 mg/dL (ref 8.9–10.3)
Chloride: 105 mmol/L (ref 98–111)
Creatinine: 1.27 mg/dL — ABNORMAL HIGH (ref 0.44–1.00)
GFR, Estimated: 48 mL/min — ABNORMAL LOW (ref >60)
Glucose, Bld: 100 mg/dL — ABNORMAL HIGH (ref 70–99)
Potassium: 4.3 mmol/L (ref 3.5–5.1)
Sodium: 135 mmol/L (ref 135–145)
Total Bilirubin: 0.4 mg/dL (ref 0.3–1.2)
Total Protein: 8.3 g/dL — ABNORMAL HIGH (ref 6.5–8.1)

## 2020-12-19 LAB — CBC WITH DIFFERENTIAL (CANCER CENTER ONLY)
Abs Immature Granulocytes: 0.03 10*3/uL (ref 0.00–0.07)
Basophils Absolute: 0.1 10*3/uL (ref 0.0–0.1)
Basophils Relative: 1 %
Eosinophils Absolute: 0 10*3/uL (ref 0.0–0.5)
Eosinophils Relative: 0 %
HCT: 30.7 % — ABNORMAL LOW (ref 36.0–46.0)
Hemoglobin: 9.6 g/dL — ABNORMAL LOW (ref 12.0–15.0)
Immature Granulocytes: 1 %
Lymphocytes Relative: 42 %
Lymphs Abs: 2.2 10*3/uL (ref 0.7–4.0)
MCH: 28.7 pg (ref 26.0–34.0)
MCHC: 31.3 g/dL (ref 30.0–36.0)
MCV: 91.9 fL (ref 80.0–100.0)
Monocytes Absolute: 0.7 10*3/uL (ref 0.1–1.0)
Monocytes Relative: 14 %
Neutro Abs: 2.1 10*3/uL (ref 1.7–7.7)
Neutrophils Relative %: 42 %
Platelet Count: 431 10*3/uL — ABNORMAL HIGH (ref 150–400)
RBC: 3.34 MIL/uL — ABNORMAL LOW (ref 3.87–5.11)
RDW: 16.4 % — ABNORMAL HIGH (ref 11.5–15.5)
WBC Count: 5.1 10*3/uL (ref 4.0–10.5)
nRBC: 0 % (ref 0.0–0.2)

## 2020-12-19 LAB — SAMPLE TO BLOOD BANK

## 2020-12-19 MED ORDER — SODIUM CHLORIDE 0.9% FLUSH
10.0000 mL | INTRAVENOUS | Status: DC | PRN
  Administered 2020-12-19: 10 mL
  Filled 2020-12-19: qty 10

## 2020-12-19 MED ORDER — SODIUM CHLORIDE 0.9 % IV SOLN
1000.0000 mg/m2 | Freq: Once | INTRAVENOUS | Status: AC
  Administered 2020-12-19: 2014 mg via INTRAVENOUS
  Filled 2020-12-19: qty 52.97

## 2020-12-19 MED ORDER — ACETAMINOPHEN 325 MG PO TABS
ORAL_TABLET | ORAL | Status: AC
  Filled 2020-12-19: qty 2

## 2020-12-19 MED ORDER — HEPARIN SOD (PORK) LOCK FLUSH 100 UNIT/ML IV SOLN
500.0000 [IU] | Freq: Once | INTRAVENOUS | Status: AC | PRN
  Administered 2020-12-19: 500 [IU]
  Filled 2020-12-19: qty 5

## 2020-12-19 MED ORDER — ACETAMINOPHEN 325 MG PO TABS
650.0000 mg | ORAL_TABLET | Freq: Once | ORAL | Status: AC
  Administered 2020-12-19: 650 mg via ORAL

## 2020-12-19 MED ORDER — PALONOSETRON HCL INJECTION 0.25 MG/5ML
0.2500 mg | Freq: Once | INTRAVENOUS | Status: AC
Start: 2020-12-19 — End: 2020-12-19
  Administered 2020-12-19: 0.25 mg via INTRAVENOUS

## 2020-12-19 MED ORDER — PALONOSETRON HCL INJECTION 0.25 MG/5ML
INTRAVENOUS | Status: AC
  Filled 2020-12-19: qty 5

## 2020-12-19 MED ORDER — SODIUM CHLORIDE 0.9 % IV SOLN
Freq: Once | INTRAVENOUS | Status: AC
  Filled 2020-12-19: qty 250

## 2020-12-19 MED ORDER — SODIUM CHLORIDE 0.9% FLUSH
10.0000 mL | Freq: Once | INTRAVENOUS | Status: AC
  Administered 2020-12-19: 10 mL
  Filled 2020-12-19: qty 10

## 2020-12-19 NOTE — Patient Instructions (Signed)
[facility] Discharge Instructions for Patients Receiving Chemotherapy  Today you received the following chemotherapy agents Gemcitabine (GEMZAR).  To help prevent nausea and vomiting after your treatment, we encourage you to take your nausea medication as prescribed.   If you develop nausea and vomiting that is not controlled by your nausea medication, call the clinic.   BELOW ARE SYMPTOMS THAT SHOULD BE REPORTED IMMEDIATELY:  *FEVER GREATER THAN 100.5 F  *CHILLS WITH OR WITHOUT FEVER  NAUSEA AND VOMITING THAT IS NOT CONTROLLED WITH YOUR NAUSEA MEDICATION  *UNUSUAL SHORTNESS OF BREATH  *UNUSUAL BRUISING OR BLEEDING  TENDERNESS IN MOUTH AND THROAT WITH OR WITHOUT PRESENCE OF ULCERS  *URINARY PROBLEMS  *BOWEL PROBLEMS  UNUSUAL RASH Items with * indicate a potential emergency and should be followed up as soon as possible.  Feel free to call the clinic should you have any questions or concerns. The clinic phone number is [phone].  Please show the CHEMO ALERT CARD at check-in to the Emergency Department and triage nurse.   

## 2020-12-21 ENCOUNTER — Emergency Department
Admission: EM | Admit: 2020-12-21 | Discharge: 2020-12-21 | Disposition: A | Discharge status: Home / Self Care | Payer: No Typology Code available for payment source | Source: Home / Self Care

## 2020-12-21 ENCOUNTER — Other Ambulatory Visit: Payer: Self-pay

## 2020-12-21 ENCOUNTER — Encounter: Payer: Self-pay | Admitting: Emergency Medicine

## 2020-12-21 DIAGNOSIS — N3001 Acute cystitis with hematuria: Secondary | ICD-10-CM

## 2020-12-21 DIAGNOSIS — R3915 Urgency of urination: Secondary | ICD-10-CM | POA: Diagnosis not present

## 2020-12-21 LAB — POCT URINALYSIS DIP (MANUAL ENTRY)
Bilirubin, UA: NEGATIVE
Glucose, UA: NEGATIVE mg/dL
Ketones, POC UA: NEGATIVE mg/dL
Nitrite, UA: NEGATIVE
Protein Ur, POC: >=300 mg/dL — AB
Spec Grav, UA: 1.025 (ref 1.010–1.025)
Urobilinogen, UA: 0.2 E.U./dL
pH, UA: 6.5 (ref 5.0–8.0)

## 2020-12-21 MED ORDER — CEPHALEXIN 500 MG PO CAPS: 500.0000 mg | ORAL_CAPSULE | Freq: Two times a day (BID) | ORAL | 0 refills | Status: AC

## 2020-12-21 MED ORDER — PHENAZOPYRIDINE HCL 95 MG PO TABS: 95.0000 mg | ORAL_TABLET | Freq: Three times a day (TID) | ORAL | 0 refills | Status: DC | PRN

## 2020-12-21 NOTE — Discharge Instructions (Signed)
  Please take your antibiotic as prescribed. A urine culture has been sent to check the severity of your urinary infection and to determine if you are on the most appropriate antibiotic. The results should come back within 2-3 days. You will only be notified if a medication change is indicated.  Please follow up with family medicine or urology if not improving within 1 week, sooner if symptoms worsening.   

## 2020-12-21 NOTE — ED Provider Notes (Signed)
[name]    CSN: [id number] Arrival date & time: 12/21/20  1138      History   Chief Complaint Chief Complaint  Patient presents with  . Urinary Frequency    HPI Mary Stark is a 65 y.o. female.   HPI Mary Stark is a 65 y.o. female presenting to UC with c/o 3 days of worsening dysuria, cloudy urine, frequency and bladder spasms. Pt is currently undergoing chemo for bladder cancer and take nitrofurantoin 100mg  daily.  Denies fever, chills, n/v/d. Hx of urosepsis a few months ago.    Past Medical History:  Diagnosis Date  . Alcohol dependence ([place])   . Anxiety   . Arthritis    Back and lt knee  . Cancer ([place])    skin on nose  . Depression 07/17/2013  . GERD (gastroesophageal reflux disease)   . History of hepatitis C    TX FOR 2012  . History of kidney stones   . History of rheumatic fever AS CHILD   NO PROBLEMS FROM  . Hyperlipidemia 07/17/2013  . Hypertension   . Seasonal allergies   . Toe fracture, right 10/07/2016  . Vitamin D deficiency 07/17/2013    Patient Active Problem List   Diagnosis Date Noted  . Impingement syndrome, shoulder, left 11/27/2020  . Port-A-Cath in place 11/22/2020  . 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 Malignancy ([place]) 10/31/2020  . 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 Sepsis secondary to UTI ([place]) 10/22/2020  . Normocytic anemia 10/22/2020  . Hyperproteinemia 10/22/2020  . Hydronephrosis 10/01/2020  . Proteinuria 10/01/2020  . Abnormal CT of the abdomen 10/01/2020  . Bilateral lower extremity edema 09/12/2020  . Acute hypoxemic respiratory failure due to COVID-19 ([place]) 08/19/2020  . Tear of medial meniscus of left knee, current 07/13/2019  . Internal derangement of knee involving posterior horn of lateral meniscus, left 07/13/2019  . Primary osteoarthritis of left knee 07/13/2019  . Renal insufficiency 07/13/2019  . Anxiety about health 03/22/2019  . Hypokalemia 03/21/2019  . Essential hypertension  02/13/2019  . 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/2018  . Anemia due to blood loss, acute 04/14/2018  . Hypomagnesemia 04/14/2018  . Hypocalcemia 04/14/2018  . Tobacco abuse 04/08/2018  . Depression 04/07/2018  . AKI (acute kidney injury) ([place]) 04/07/2018  . Sepsis ([place]) 04/07/2018  . Acute colitis 04/07/2018  . Avascular necrosis of humeral head, right ([place]) 03/30/2018  . NSAID long-term use 03/30/2018  . Goals of care, counseling/discussion 03/30/2018  . Seborrheic keratoses 03/15/2018  . History of nonmelanoma skin cancer 03/02/2018  . Skin lesion of chest wall 03/02/2018  . Alcohol dependence with unspecified alcohol-induced disorder ([place]) 02/11/2018  . Severe episode of recurrent major depressive disorder, without psychotic features ([place]) 02/11/2018  . Transaminitis 01/06/2018  . Nicotine dependence 01/06/2018  . Heavy alcohol consumption 01/06/2018  . Encounter for monitoring statin therapy 01/06/2018  . Class 1 obesity due to excess calories with serious comorbidity in adult 01/06/2018  . Chronic pain syndrome 04/21/2017  . Chronic right shoulder pain 07/03/2015  . Vaginismus 04/12/2014  . Menopausal state 11/08/2013  . Family history of ovarian cancer 09/27/2013  . 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 Personal history of colonic polyps 07/18/2013  . Dyslipidemia, goal LDL below 100 07/17/2013  . Depression with anxiety 07/17/2013  . Hx of hepatitis C 07/17/2013  . Vitamin D deficiency 07/17/2013  . Right lumbar radiculopathy 07/17/2013  . History of alcohol dependence ([place]) 07/17/2013  . H/O: rheumatic fever 06/30/2012  .  Insomnia 06/30/2012    Past Surgical History:  Procedure Laterality Date  . ANAL RECTAL MANOMETRY N/A 01/31/2020   Procedure: ANO RECTAL MANOMETRY;  Surgeon: Arta Silence, MD;  Location: [facility];  Service: Endoscopy;  Laterality: N/A;  . [place]  . CYSTOSCOPY WITH RETROGRADE PYELOGRAM, URETEROSCOPY AND STENT PLACEMENT Bilateral  06/11/2020   Procedure: CYSTOSCOPY WITH RETROGRADE PYELOGRAM, URETEROSCOPY AND STENT PLACEMENT, RIGHT URETERAL BIOPSY;  Surgeon: Robley Fries, MD;  Location: [facility];  Service: Urology;  Laterality: Bilateral;  90 MINS  . CYSTOSCOPY WITH RETROGRADE PYELOGRAM, URETEROSCOPY AND STENT PLACEMENT Bilateral 07/05/2020   Procedure: CYSTOSCOPY WITH RETROGRADE PYELOGRAM, URETEROSCOPY,  BIOPSIES AND STENT EXCHANGES;  Surgeon: Robley Fries, MD;  Location: [facility];  Service: Urology;  Laterality: Bilateral;  . FINGER SURGERY Right    5th  . GYNECOLOGIC CRYOSURGERY  YRS AGO  . IR IMAGING GUIDED PORT INSERTION  11/18/2020  . TONSILLECTOMY  AS CHILD    OB History   No obstetric history on file.      Home Medications    Prior to Admission medications   Medication Sig Start Date End Date Taking? Authorizing Provider  acetaminophen (TYLENOL) 500 MG tablet Take 1,000 mg by mouth every 8 (eight) hours as needed for moderate pain.    Yes [provider]  albuterol (PROAIR HFA) 108 (90 Base) MCG/ACT inhaler INHALE 1 TO 2 PUFFS BY MOUTH INTO THE LUNGS EVERY 4 HOURS AS NEEDED FOR WHEEZING OR SHORTNESS OF BREATH Patient taking differently: Inhale 1-2 puffs into the lungs every 4 (four) hours as needed for wheezing or shortness of breath. INHALE 1 TO 2 PUFFS BY MOUTH INTO THE LUNGS EVERY 4 HOURS AS NEEDED FOR WHEEZING OR SHORTNESS OF BREATH 08/28/20  Yes [name], DO  amLODipine (NORVASC) 5 MG tablet Take 1 tablet (5 mg total) by mouth daily. appt for refills 12/18/20  Yes [name], DO  ARIPiprazole 10 MG TABS Take 10 mg by mouth daily. 11/28/20  Yes [name], DO  ascorbic acid (VITAMIN C) 500 MG tablet Take 1,000 mg by mouth every evening.    Yes [provider]  ASPIRIN LOW DOSE 81 MG EC tablet TAKE 1 TABLET BY MOUTH DAILY. Patient taking differently: Take 81 mg by mouth daily. 04/09/20  Yes [name], DO  buPROPion (WELLBUTRIN XL)  300 MG 24 hr tablet Take 1 tablet (300 mg total) by mouth daily. 11/22/19  Yes Plovsky, Berneta Sages, MD  BYSTOLIC 20 MG TABS TAKE 1 TABLET (20 MG TOTAL) BY MOUTH DAILY. Patient taking differently: Take 20 mg by mouth daily. 10/17/20  Yes [name], DO  cephALEXin (KEFLEX) 500 MG capsule Take 1 capsule (500 mg total) by mouth 2 (two) times daily for 7 days. 12/21/20 12/28/20 Yes Yechiel Erny, Bronwen Betters, PA-C  Cholecalciferol (VITAMIN D) 125 MCG (5000 UT) CAPS Take 5,000 Units by mouth every evening.    Yes [provider]  cholestyramine light (PREVALITE) 4 g packet Take 1 packet (4 g total) by mouth 2 (two) times daily. 11/13/20  Yes [name], DO  clonazePAM (KLONOPIN) 0.5 MG tablet Take 1 tablet (0.5 mg total) by mouth at bedtime. 11/19/20  Yes [name], DO  Coenzyme Q10 (CO Q-10) 200 MG CAPS Take 200 mg by mouth every evening.    Yes [provider]  fexofenadine (ALLEGRA) 180 MG tablet Take 180 mg by mouth every evening.    Yes [provider]  furosemide (LASIX) 20 MG tablet  Take 20mg  on day 1 and 2 then every other day. Patient taking differently: Take 20 mg by mouth as needed for edema. 09/13/20  Yes Early, [name], NP  gabapentin (NEURONTIN) 300 MG capsule 1  qam   4   qhs Patient taking differently: Take 300-1,200 mg by mouth See admin instructions. Take 300 mg by mouth in the morning and afternoon and 1200 mg at night 11/22/19  Yes Plovsky, Berneta Sages, MD  GLUCOSAMINE-CHONDROITIN PO Take 2 tablets by mouth every evening.    Yes [provider]  lidocaine-prilocaine (EMLA) cream Apply 1 application topically as needed. 11/06/20  Yes Wyatt Portela, MD  nitrofurantoin (MACRODANTIN) 100 MG capsule Take 1 capsule (100 mg total) by mouth at bedtime. 10/30/20  Yes [name], DO  Omega-3 Fatty Acids (FISH OIL) 1200 MG CAPS Take 1,200 mg by mouth every evening.    Yes [provider]  omeprazole (PRILOSEC) 20 MG capsule Take 1 capsule (20 mg  total) by mouth daily. appt for further refills 12/18/20  Yes [name], DO  ondansetron (ZOFRAN-ODT) 4 MG disintegrating tablet Take 1 tablet (4 mg total) by mouth every 8 (eight) hours as needed for nausea or vomiting. 06/03/20  Yes Noe Gens, PA-C  oxyCODONE-acetaminophen (PERCOCET) 10-325 MG tablet Take 1 tablet by mouth every 8 (eight) hours as needed for pain. 11/27/20  Yes Silverio Decamp, MD  phenazopyridine (PYRIDIUM) 95 MG tablet Take 1 tablet (95 mg total) by mouth 3 (three) times daily as needed for pain. 12/21/20  Yes Kanyia Heaslip O, PA-C  simvastatin (ZOCOR) 20 MG tablet TAKE 1 TABLET (20 MG TOTAL) BY MOUTH DAILY. Patient taking differently: Take 20 mg by mouth daily at 6 PM. 09/19/20  Yes [name], DO  thiamine 100 MG tablet Take 1 tablet (100 mg total) by mouth daily. 10/26/20  Yes Bonnielee Haff, MD  vitamin B-12 (CYANOCOBALAMIN) 1000 MCG tablet Take 2,000 mcg by mouth every evening.    Yes [provider]  Vitamin E 180 MG (400 UNIT) CAPS Take 400 Units by mouth every evening.    Yes [provider]  DULoxetine (CYMBALTA) 30 MG capsule Take 30 mg by mouth daily.  Patient not taking: Reported on 11/13/2020 09/17/20   [provider]  guaiFENesin-dextromethorphan (ROBITUSSIN DM) 100-10 MG/5ML syrup Take 10 mLs by mouth every 4 (four) hours as needed for cough. 08/28/20   [name], DO  prochlorperazine (COMPAZINE) 10 MG tablet Take 1 tablet (10 mg total) by mouth every 6 (six) hours as needed for nausea or vomiting. 11/06/20   Wyatt Portela, MD  tamsulosin (FLOMAX) 0.4 MG CAPS capsule Take 0.4 mg by mouth at bedtime. Patient not taking: Reported on 11/13/2020 08/07/20   [provider]    Family History Family History  Problem Relation Age of Onset  . Ovarian cancer Mother   . Cardiomyopathy Father   . Valvular heart disease Father   . Liver cancer Brother   . Protein C deficiency Brother   . Protein C  deficiency Brother   . Stroke Brother     Social History Social History   Tobacco Use  . Smoking status: Former Smoker    Packs/day: 1.00    Years: 25.00    Pack years: 25.00    Types: Cigarettes    Quit date: 2012    Years since quitting: 10.0  . Smokeless tobacco: Never Used  Vaping Use  . Vaping Use: Every day  . Substances: Nicotine  Substance Use Topics  . Alcohol use: Not Currently    Alcohol/week: 24.0 - 36.0 standard drinks    Types: 24 - 36 Cans of beer per week    Comment: Pt reports that she stopped march 2021  . Drug use: No     Allergies   Metformin and related, Celebrex [celecoxib], and Penicillins   Review of Systems Review of Systems  Constitutional: Negative for chills and fever.  Gastrointestinal: Positive for abdominal pain (bladder spasms). Negative for diarrhea, nausea and vomiting.  Genitourinary: Positive for dysuria, frequency, hematuria (cloudy) and urgency. Negative for vaginal bleeding, vaginal discharge and vaginal pain.     Physical Exam Triage Vital Signs ED Triage Vitals  Enc Vitals Group     BP 12/21/20 1222 (!) 168/89     Pulse Rate 12/21/20 1222 71     Resp --      Temp 12/21/20 1222 98.3 F (36.8 C)     Temp Source 12/21/20 1222 Oral     SpO2 12/21/20 1222 95 %     Weight --      Height --      Head Circumference --      Peak Flow --      Pain Score 12/21/20 1223 3     Pain Loc --      Pain Edu? --      Excl. in [place]? --    No data found.  Updated Vital Signs BP (!) 168/89 (BP Location: Right Arm)   Pulse 71   Temp 98.3 F (36.8 C) (Oral)   SpO2 95%   Visual Acuity Right Eye Distance:   Left Eye Distance:   Bilateral Distance:    Right Eye Near:   Left Eye Near:    Bilateral Near:     Physical Exam Vitals and nursing note reviewed.  Constitutional:      General: She is not in acute distress.    Appearance: Normal appearance. She is well-developed and well-nourished. She is not ill-appearing,  toxic-appearing or diaphoretic.  HENT:     Head: Normocephalic and atraumatic.  Eyes:     Extraocular Movements: EOM normal.  Cardiovascular:     Rate and Rhythm: Normal rate and regular rhythm.  Pulmonary:     Effort: Pulmonary effort is normal. No respiratory distress.     Breath sounds: Normal breath sounds. No stridor. No wheezing, rhonchi or rales.  Abdominal:     General: There is no distension.     Palpations: Abdomen is soft.     Tenderness: There is abdominal tenderness ( suprapubic, mild). There is no right CVA tenderness or left CVA tenderness.  Musculoskeletal:        General: Normal range of motion.     Cervical back: Normal range of motion.  Skin:    General: Skin is warm and dry.  Neurological:     Mental Status: She is alert and oriented to person, place, and time.  Psychiatric:        Mood and Affect: Mood and affect normal.        Behavior: Behavior normal.      UC Treatments / Results  Labs (all labs ordered are listed, but only abnormal results are displayed) Labs Reviewed  POCT URINALYSIS DIP (MANUAL ENTRY) - Abnormal; Notable for the following components:      Result Value   Blood, UA moderate (*)    Protein Ur, POC >=300 (*)    Leukocytes, UA Small (1+) (*)  All other components within normal limits  URINE CULTURE    EKG   Radiology No results found.  Procedures Procedures (including critical care time)  Medications Ordered in UC Medications - No data to display  Initial Impression / Assessment and Plan / UC Course  I have reviewed the triage vital signs and the nursing notes.  Pertinent labs & imaging results that were available during my care of the patient were reviewed by me and considered in my medical decision making (see chart for details).    Hx and UA c/w UTI Culture sent Will start pt on keflex F/u with care team if not improving Discussed symptoms that warrant emergent care in the ED.  Final Clinical Impressions(s) /  UC Diagnoses   Final diagnoses:  Urinary urgency  Acute cystitis with hematuria     Discharge Instructions      Please take your antibiotic as prescribed. A urine culture has been sent to check the severity of your urinary infection and to determine if you are on the most appropriate antibiotic. The results should come back within 2-3 days. You will only be notified if a medication change is indicated.  Please follow up with family medicine or urology if not improving within 1 week, sooner if symptoms worsening.      ED Prescriptions    Medication Sig Dispense Auth. Provider   cephALEXin (KEFLEX) 500 MG capsule Take 1 capsule (500 mg total) by mouth 2 (two) times daily for 7 days. 14 capsule Leeroy Cha O, PA-C   phenazopyridine (PYRIDIUM) 95 MG tablet Take 1 tablet (95 mg total) by mouth 3 (three) times daily as needed for pain. 10 tablet Noe Gens, PA-C     PDMP not reviewed this encounter.   Noe Gens, [place] 12/21/20 1408

## 2020-12-21 NOTE — ED Triage Notes (Signed)
Patient c/o possible UTI.  Patient does have bladder cancer so unsure of when these sx's started.  Patient c/o urgency, frequency, dysuria, urine milky.  Patient does take Nitrofurantoin 100mg  daily.  Patient is vaccinated.

## 2020-12-22 LAB — URINE CULTURE
MICRO NUMBER:: 11397992
Result:: NO GROWTH
SPECIMEN QUALITY:: ADEQUATE

## 2020-12-25 ENCOUNTER — Telehealth (INDEPENDENT_AMBULATORY_CARE_PROVIDER_SITE_OTHER): Payer: No Typology Code available for payment source | Admitting: Sports Medicine

## 2020-12-25 ENCOUNTER — Telehealth: Payer: Self-pay | Admitting: *Deleted

## 2020-12-25 ENCOUNTER — Telehealth: Payer: Self-pay | Admitting: Osteopathic Medicine

## 2020-12-25 DIAGNOSIS — M1712 Unilateral primary osteoarthritis, left knee: Secondary | ICD-10-CM

## 2020-12-25 DIAGNOSIS — M7542 Impingement syndrome of left shoulder: Secondary | ICD-10-CM

## 2020-12-25 NOTE — Progress Notes (Signed)
Wasn't able to do exercises but has been doing stretches (shoulder).

## 2020-12-25 NOTE — Progress Notes (Signed)
   Virtual Visit via WebEx/MyChart   I connected with  Mary Stark  on 12/25/20 via WebEx/MyChart/Doximity Video and verified that I am speaking with the correct person using two identifiers.   I discussed the limitations, risks, security and privacy concerns of performing an evaluation and management service by WebEx/MyChart/Doximity Video, including the higher likelihood of inaccurate diagnosis and treatment, and the availability of in person appointments.  We also discussed the likely need of an additional face to face encounter for complete and high quality delivery of care.  I also discussed with the patient that there may be a patient responsible charge related to this service. The patient expressed understanding and wishes to proceed.  Provider location is in medical facility. Patient location is at their home, different from provider location. People involved in care of the patient during this telehealth encounter were myself, my nurse/medical assistant, and my front office/scheduling team member.  Review of Systems: No fevers, chills, night sweats, weight loss, chest pain, or shortness of breath.   Objective Findings:    General: Speaking full sentences, no audible heavy breathing.  Sounds alert and appropriately interactive.  Appears well.  Face symmetric.  Extraocular movements intact.  Pupils equal and round.  No nasal flaring or accessory muscle use visualized.  Independent interpretation of tests performed by another provider:   None.  Brief History, Exam, Impression, and Recommendations:    Impingement syndrome, shoulder, left I am seeing Mary Stark today in a virtual visit, she has left shoulder impingement syndrome, we increased her oxycodone to 10 mg 3 times daily (has metastatic transitional cell carcinoma). This seems to have helped. She continues to have discomfort so I would like her to come back for a subacromial injection, we are avoiding NSAIDs due to her renal  function.  Primary osteoarthritis of left knee As above she continues to have pain in her knee, we did an aspiration and injection back in June, was happy until recently, the narcotic has helped, but I would like her to come back for an injection if she does continue to have moderate symptoms.   I discussed the above assessment and treatment plan with the patient. The patient was provided an opportunity to ask questions and all were answered. The patient agreed with the plan and demonstrated an understanding of the instructions.   The patient was advised to call back or seek an in-person evaluation if the symptoms worsen or if the condition fails to improve as anticipated.   I provided 15 minutes of face to face and non-face-to-face time during this encounter date, time was needed to gather information, review chart, records, communicate/coordinate with staff remotely, as well as complete documentation.   ___________________________________________ Gwen Her. Dianah Field, M.D., ABFM., CAQSM. Primary Care and [place] Instructor of [place] [employer] [facility]

## 2020-12-25 NOTE — Telephone Encounter (Signed)
Disability forms completed Needs faxed once records attached Placed in RMA's basket

## 2020-12-25 NOTE — Assessment & Plan Note (Signed)
As above she continues to have pain in her knee, we did an aspiration and injection back in June, was happy until recently, the narcotic has helped, but I would like her to come back for an injection if she does continue to have moderate symptoms.

## 2020-12-25 NOTE — Telephone Encounter (Signed)
Task completed. D/a form has been faxed. Confirmation rec'd. Pt has been updated and aware paperwork faxed & then submitted to medical records attachments. Copy of D/a form placed in scan folder.

## 2020-12-25 NOTE — Assessment & Plan Note (Signed)
I am seeing Mary Stark today in a virtual visit, she has left shoulder impingement syndrome, we increased her oxycodone to 10 mg 3 times daily (has metastatic transitional cell carcinoma). This seems to have helped. She continues to have discomfort so I would like her to come back for a subacromial injection, we are avoiding NSAIDs due to her renal function.

## 2020-12-26 ENCOUNTER — Other Ambulatory Visit: Payer: No Typology Code available for payment source

## 2020-12-26 ENCOUNTER — Telehealth: Payer: Self-pay | Admitting: Sports Medicine

## 2020-12-26 ENCOUNTER — Ambulatory Visit: Payer: No Typology Code available for payment source | Admitting: Oncology

## 2020-12-26 ENCOUNTER — Ambulatory Visit: Payer: No Typology Code available for payment source

## 2020-12-26 ENCOUNTER — Other Ambulatory Visit (HOSPITAL_COMMUNITY): Payer: Self-pay | Admitting: Adult Health

## 2020-12-26 MED FILL — TAMSULOSIN HCL 0.4 MG CAP: 0.4 | 30 days supply | Qty: 30 | Fill #0

## 2020-12-26 MED FILL — MYRBETRIQ ER 25 MG TABLET: 25 | 30 days supply | Qty: 30 | Fill #0

## 2020-12-26 NOTE — Telephone Encounter (Signed)
Mary Stark had me schedule her for injections in her shoulder the same day that she is to have a chemo treatment. I told her that I wasn't sure if this was something that could be done. Should I call her and reschedule?

## 2020-12-26 NOTE — Telephone Encounter (Signed)
I am out till Tuesday, please send response to [facility] pool.

## 2020-12-27 NOTE — Telephone Encounter (Signed)
Actually a steroid injection and chemotherapy may actually be a good idea.  Chemotherapy is often infused with steroids to decrease the full body inflammatory reaction and increased tolerability.  I think it would be fine.

## 2020-12-30 NOTE — Telephone Encounter (Signed)
Left message for a return call from patient.  

## 2020-12-31 NOTE — Telephone Encounter (Signed)
Patient aware.

## 2021-01-01 ENCOUNTER — Telehealth (INDEPENDENT_AMBULATORY_CARE_PROVIDER_SITE_OTHER): Payer: No Typology Code available for payment source | Admitting: Osteopathic Medicine

## 2021-01-01 ENCOUNTER — Encounter: Payer: Self-pay | Admitting: Osteopathic Medicine

## 2021-01-01 VITALS — BP 118/70 | HR 73 | Wt 184.0 lb

## 2021-01-01 DIAGNOSIS — R194 Change in bowel habit: Secondary | ICD-10-CM | POA: Insufficient documentation

## 2021-01-01 DIAGNOSIS — K649 Unspecified hemorrhoids: Secondary | ICD-10-CM | POA: Insufficient documentation

## 2021-01-01 DIAGNOSIS — H5340 Unspecified visual field defects: Secondary | ICD-10-CM

## 2021-01-01 DIAGNOSIS — R935 Abnormal findings on diagnostic imaging of other abdominal regions, including retroperitoneum: Secondary | ICD-10-CM | POA: Insufficient documentation

## 2021-01-01 DIAGNOSIS — H43392 Other vitreous opacities, left eye: Secondary | ICD-10-CM

## 2021-01-01 DIAGNOSIS — R1031 Right lower quadrant pain: Secondary | ICD-10-CM | POA: Insufficient documentation

## 2021-01-01 DIAGNOSIS — R159 Full incontinence of feces: Secondary | ICD-10-CM | POA: Insufficient documentation

## 2021-01-01 MED FILL — GABAPENTIN 300 MG CAPSULE: 300 | 30 days supply | Qty: 180 | Fill #0

## 2021-01-01 MED FILL — SIMVASTATIN 20 MG TABLET: 20 | 90 days supply | Qty: 90 | Fill #0

## 2021-01-01 NOTE — Progress Notes (Signed)
Requesting a referral to opthalmology due to floater in left eye. Also, field vision test with poor results on the left side.

## 2021-01-01 NOTE — Progress Notes (Signed)
Telemedicine Visit via  Video & Audio (App used: Mychart) Audio only - telephone (patient preference /  technical difficulty with MyChart video application)  I connected with Mary Stark on 01/01/21 at 1:27 PM  by phone or  telemedicine application as noted above  I verified that I am speaking with or regarding  the correct patient using two identifiers.  Participants: Myself, Mikael Spray MS3 and Dr [name] DO Patient: Mary Stark Patient proxy if applicable: None   Patient is at home I am in office at [facility]    I discussed the limitations of evaluation and management  by telemedicine and the availability of in person appointments.  The participant(s) above expressed understanding and  agreed to proceed with this appointment via telemedicine.       History of Present Illness: Mary Stark is a 65 y.o. female who would like to discuss     Eye problems Pt was recently seen by her optometrist who noted a L visual field defect and recommended she see a specialist. A couple days after this she developed a floater in her L eye a couple of days after this appointment (2 days ago). The floater has remained in the left corner of her L eye since then.  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 She went to urgent care 01/08 and was started on cephalexin and phenazopyridine. Since then her symptoms have resolved and she denies dysuria, hematuria, frequency, urgency.     Observations/Objective: BP 118/70   Pulse 73   Wt 184 lb (83.5 kg)   BMI 29.70 kg/m  BP Readings from Last 3 Encounters:  01/01/21 118/70  12/25/20 137/78  12/21/20 (!) 168/89   Exam: Normal Speech.  NAD  Lab and Radiology Results No results found for this or any previous visit (from the past 72 hour(s)). No results found.     Assessment and Plan: 65 y.o. female  with The primary encounter diagnosis was Visual field defect. A diagnosis of Floater, vitreous, left was also pertinent to this visit.   Frequent UTI  Advised d/w urology, probably needs ppx abx at this point   Floater and L visual field defect  Referral to ophthalmology    PDMP not reviewed this encounter. Orders Placed This Encounter  Procedures  . Ambulatory referral to Ophthalmology    Referral Priority:   Routine    Referral Type:   Consultation    Referral Reason:   Specialty Services Required    Requested Specialty:   Ophthalmology    Number of Visits Requested:   1   No orders of the defined types were placed in this encounter.  There are no Patient Instructions on file for this visit.  Instructions sent via MyChart.   Follow Up Instructions: No follow-ups on file.    I discussed the assessment and treatment plan with the patient. The patient was provided an opportunity to ask questions and all were answered. The patient agreed with the plan and demonstrated an understanding of the instructions.   The patient was advised to call back or seek an in-person evaluation if any new concerns, if symptoms worsen or if the condition fails to improve as anticipated.  15 minutes of non-face-to-face time was provided during this encounter.      . . . . . . . . . . . . . Marland Kitchen  Historical information moved to improve visibility of documentation.  Past Medical History:  Diagnosis Date  . Alcohol dependence ([place])   . Anxiety   . Arthritis    Back and lt knee  . Cancer ([place])    skin on nose  . Depression 07/17/2013  . GERD (gastroesophageal reflux disease)   . History of hepatitis C    TX FOR 2012  . History of kidney stones   . History of rheumatic fever AS CHILD   NO PROBLEMS FROM  . Hyperlipidemia 07/17/2013  . Hypertension   . Seasonal allergies   . Toe fracture, right 10/07/2016  . Vitamin D deficiency 07/17/2013    Past Surgical History:  Procedure Laterality Date  . ANAL RECTAL MANOMETRY N/A 01/31/2020   Procedure: ANO RECTAL MANOMETRY;  Surgeon: Arta Silence, MD;  Location: [facility];  Service: Endoscopy;  Laterality: N/A;  . [place]  . CYSTOSCOPY WITH RETROGRADE PYELOGRAM, URETEROSCOPY AND STENT PLACEMENT Bilateral 06/11/2020   Procedure: CYSTOSCOPY WITH RETROGRADE PYELOGRAM, URETEROSCOPY AND STENT PLACEMENT, RIGHT URETERAL BIOPSY;  Surgeon: Robley Fries, MD;  Location: [facility];  Service: Urology;  Laterality: Bilateral;  90 MINS  . CYSTOSCOPY WITH RETROGRADE PYELOGRAM, URETEROSCOPY AND STENT PLACEMENT Bilateral 07/05/2020   Procedure: CYSTOSCOPY WITH RETROGRADE PYELOGRAM, URETEROSCOPY,  BIOPSIES AND STENT EXCHANGES;  Surgeon: Robley Fries, MD;  Location: [facility];  Service: Urology;  Laterality: Bilateral;  . FINGER SURGERY Right    5th  . GYNECOLOGIC CRYOSURGERY  YRS AGO  . IR IMAGING GUIDED PORT INSERTION  11/18/2020  . TONSILLECTOMY  AS CHILD   Social History   Tobacco Use  . Smoking status: Former Smoker    Packs/day: 1.00    Years: 25.00    Pack years: 25.00    Types: Cigarettes    Quit date: 2012    Years since quitting: 10.0  . Smokeless tobacco: Never Used  Substance Use Topics  . Alcohol use: Not Currently    Alcohol/week: 24.0 - 36.0 standard drinks    Types: 24 - 36 Cans of beer per week    Comment: Pt reports that she stopped march 2021   family history includes Cardiomyopathy in her father; Liver cancer in her brother; Ovarian cancer in her mother; Protein C deficiency in her brother and brother; Stroke in her brother; Valvular heart disease in her father.  Medications: Current Outpatient Medications  Medication Sig Dispense Refill  . acetaminophen (TYLENOL) 500 MG tablet Take 1,000 mg by mouth every 8 (eight) hours as needed for moderate pain.     Marland Kitchen albuterol (PROAIR HFA) 108 (90 Base) MCG/ACT inhaler INHALE 1 TO 2 PUFFS BY  MOUTH INTO THE LUNGS EVERY 4 HOURS AS NEEDED FOR WHEEZING OR SHORTNESS OF BREATH (Patient taking differently: Inhale 1-2 puffs into the lungs every 4 (four) hours as needed for wheezing or shortness of breath. INHALE 1 TO 2 PUFFS BY MOUTH INTO THE LUNGS EVERY 4 HOURS AS NEEDED FOR WHEEZING OR SHORTNESS OF BREATH) 8.5 g 1  . amLODipine (NORVASC) 5 MG tablet Take 1 tablet (5 mg total) by mouth daily. appt for refills 90 tablet 0  . ARIPiprazole 10 MG TABS Take 10 mg by mouth daily. 90 tablet 1  . ascorbic acid (VITAMIN C) 500 MG tablet Take 1,000 mg by mouth every evening.     . ASPIRIN LOW DOSE 81 MG EC tablet TAKE 1 TABLET BY MOUTH DAILY. (Patient taking differently: Take 81 mg by mouth daily.)  90 tablet 3  . buPROPion (WELLBUTRIN XL) 300 MG 24 hr tablet Take 1 tablet (300 mg total) by mouth daily. 90 tablet 1  . BYSTOLIC 20 MG TABS TAKE 1 TABLET (20 MG TOTAL) BY MOUTH DAILY. (Patient taking differently: Take 20 mg by mouth daily.) 90 tablet 1  . Cholecalciferol (VITAMIN D) 125 MCG (5000 UT) CAPS Take 5,000 Units by mouth every evening.     . clonazePAM (KLONOPIN) 0.5 MG tablet Take 1 tablet (0.5 mg total) by mouth at bedtime. 30 tablet 2  . Coenzyme Q10 (CO Q-10) 200 MG CAPS Take 200 mg by mouth every evening.     . fexofenadine (ALLEGRA) 180 MG tablet Take 180 mg by mouth every evening.     . furosemide (LASIX) 20 MG tablet Take 20mg  on day 1 and 2 then every other day. (Patient taking differently: Take 20 mg by mouth as needed for edema.) 30 tablet 3  . gabapentin (NEURONTIN) 300 MG capsule 1  qam   4   qhs (Patient taking differently: Take 300-1,200 mg by mouth See admin instructions. Take 300 mg by mouth in the morning and afternoon and 1200 mg at night) 150 capsule 5  . GLUCOSAMINE-CHONDROITIN PO Take 2 tablets by mouth every evening.     Marland Kitchen guaiFENesin-dextromethorphan (ROBITUSSIN DM) 100-10 MG/5ML syrup Take 10 mLs by mouth every 4 (four) hours as needed for cough. 473 mL 0  .  lidocaine-prilocaine (EMLA) cream Apply 1 application topically as needed. 30 g 0  . nitrofurantoin (MACRODANTIN) 100 MG capsule Take 1 capsule (100 mg total) by mouth at bedtime. 90 capsule 1  . Omega-3 Fatty Acids (FISH OIL) 1200 MG CAPS Take 1,200 mg by mouth every evening.     Marland Kitchen omeprazole (PRILOSEC) 20 MG capsule Take 1 capsule (20 mg total) by mouth daily. appt for further refills 90 capsule 0  . ondansetron (ZOFRAN-ODT) 4 MG disintegrating tablet Take 1 tablet (4 mg total) by mouth every 8 (eight) hours as needed for nausea or vomiting. 12 tablet 0  . oxyCODONE-acetaminophen (PERCOCET) 10-325 MG tablet Take 1 tablet by mouth every 8 (eight) hours as needed for pain. 90 tablet 0  . simvastatin (ZOCOR) 20 MG tablet TAKE 1 TABLET (20 MG TOTAL) BY MOUTH DAILY. (Patient taking differently: Take 20 mg by mouth daily at 6 PM.) 90 tablet 1  . thiamine 100 MG tablet Take 1 tablet (100 mg total) by mouth daily. 30 tablet 0  . vitamin B-12 (CYANOCOBALAMIN) 1000 MCG tablet Take 2,000 mcg by mouth every evening.     . Vitamin E 180 MG (400 UNIT) CAPS Take 400 Units by mouth every evening.      No current facility-administered medications for this visit.   Allergies  Allergen Reactions  . Metformin And Related Other (See Comments)    Lactic acidosis   . Penicillins Hives and Rash    Reports hives to penicillin and amoxicillin as an adult "a long time ago." Has tolerated cefdinir (09/2020) and cefepime (10/2020) before.     If phone visit, billing and coding can please add appropriate modifier if needed

## 2021-01-02 ENCOUNTER — Other Ambulatory Visit: Payer: No Typology Code available for payment source

## 2021-01-02 ENCOUNTER — Other Ambulatory Visit: Payer: Self-pay | Admitting: Urology

## 2021-01-02 ENCOUNTER — Inpatient Hospital Stay: Payer: No Typology Code available for payment source

## 2021-01-02 ENCOUNTER — Other Ambulatory Visit: Payer: Self-pay

## 2021-01-02 ENCOUNTER — Ambulatory Visit: Payer: No Typology Code available for payment source

## 2021-01-02 ENCOUNTER — Ambulatory Visit (INDEPENDENT_AMBULATORY_CARE_PROVIDER_SITE_OTHER): Payer: No Typology Code available for payment source | Admitting: Sports Medicine

## 2021-01-02 ENCOUNTER — Inpatient Hospital Stay (HOSPITAL_BASED_OUTPATIENT_CLINIC_OR_DEPARTMENT_OTHER): Payer: No Typology Code available for payment source | Admitting: Oncology

## 2021-01-02 VITALS — BP 160/79 | HR 74 | Temp 98.2°F | Resp 20 | Ht 66.0 in | Wt 192.0 lb

## 2021-01-02 DIAGNOSIS — C801 Malignant (primary) neoplasm, unspecified: Secondary | ICD-10-CM | POA: Diagnosis not present

## 2021-01-02 DIAGNOSIS — M7542 Impingement syndrome of left shoulder: Secondary | ICD-10-CM | POA: Diagnosis not present

## 2021-01-02 DIAGNOSIS — C669 Malignant neoplasm of unspecified ureter: Secondary | ICD-10-CM

## 2021-01-02 DIAGNOSIS — Z95828 Presence of other vascular implants and grafts: Secondary | ICD-10-CM

## 2021-01-02 DIAGNOSIS — Z5111 Encounter for antineoplastic chemotherapy: Secondary | ICD-10-CM | POA: Diagnosis not present

## 2021-01-02 DIAGNOSIS — M1712 Unilateral primary osteoarthritis, left knee: Secondary | ICD-10-CM | POA: Diagnosis not present

## 2021-01-02 LAB — CBC WITH DIFFERENTIAL (CANCER CENTER ONLY)
Abs Immature Granulocytes: 0.02 10*3/uL (ref 0.00–0.07)
Basophils Absolute: 0 10*3/uL (ref 0.0–0.1)
Basophils Relative: 1 %
Eosinophils Absolute: 0.1 10*3/uL (ref 0.0–0.5)
Eosinophils Relative: 1 %
HCT: 28.7 % — ABNORMAL LOW (ref 36.0–46.0)
Hemoglobin: 9.2 g/dL — ABNORMAL LOW (ref 12.0–15.0)
Immature Granulocytes: 0 %
Lymphocytes Relative: 33 %
Lymphs Abs: 1.5 10*3/uL (ref 0.7–4.0)
MCH: 30.1 pg (ref 26.0–34.0)
MCHC: 32.1 g/dL (ref 30.0–36.0)
MCV: 93.8 fL (ref 80.0–100.0)
Monocytes Absolute: 0.5 10*3/uL (ref 0.1–1.0)
Monocytes Relative: 12 %
Neutro Abs: 2.4 10*3/uL (ref 1.7–7.7)
Neutrophils Relative %: 53 %
Platelet Count: 295 10*3/uL (ref 150–400)
RBC: 3.06 MIL/uL — ABNORMAL LOW (ref 3.87–5.11)
RDW: 18.6 % — ABNORMAL HIGH (ref 11.5–15.5)
WBC Count: 4.6 10*3/uL (ref 4.0–10.5)
nRBC: 0 % (ref 0.0–0.2)

## 2021-01-02 LAB — CMP (CANCER CENTER ONLY)
ALT: 12 U/L (ref 0–44)
AST: 20 U/L (ref 15–41)
Albumin: 3.6 g/dL (ref 3.5–5.0)
Alkaline Phosphatase: 72 U/L (ref 38–126)
Anion gap: 8 (ref 5–15)
BUN: 16 mg/dL (ref 8–23)
CO2: 22 mmol/L (ref 22–32)
Calcium: 9.1 mg/dL (ref 8.9–10.3)
Chloride: 108 mmol/L (ref 98–111)
Creatinine: 1.27 mg/dL — ABNORMAL HIGH (ref 0.44–1.00)
GFR, Estimated: 48 mL/min — ABNORMAL LOW (ref >60)
Glucose, Bld: 127 mg/dL — ABNORMAL HIGH (ref 70–99)
Potassium: 4.1 mmol/L (ref 3.5–5.1)
Sodium: 138 mmol/L (ref 135–145)
Total Bilirubin: <0.2 mg/dL — ABNORMAL LOW (ref 0.3–1.2)
Total Protein: 8.1 g/dL (ref 6.5–8.1)

## 2021-01-02 LAB — SAMPLE TO BLOOD BANK

## 2021-01-02 MED ORDER — HEPARIN SOD (PORK) LOCK FLUSH 100 UNIT/ML IV SOLN
500.0000 [IU] | Freq: Once | INTRAVENOUS | Status: AC | PRN
  Administered 2021-01-02: 500 [IU]
  Filled 2021-01-02: qty 5

## 2021-01-02 MED ORDER — SODIUM CHLORIDE 0.9 % IV SOLN
150.0000 mg | Freq: Once | INTRAVENOUS | Status: AC
  Administered 2021-01-02: 150 mg via INTRAVENOUS
  Filled 2021-01-02: qty 150

## 2021-01-02 MED ORDER — SODIUM CHLORIDE 0.9% FLUSH
10.0000 mL | Freq: Once | INTRAVENOUS | Status: AC
  Administered 2021-01-02: 10 mL
  Filled 2021-01-02: qty 10

## 2021-01-02 MED ORDER — OXYCODONE-ACETAMINOPHEN 10-325 MG PO TABS: 1.0000 | ORAL_TABLET | Freq: Three times a day (TID) | ORAL | 0 refills | Status: DC | PRN

## 2021-01-02 MED ORDER — PALONOSETRON HCL INJECTION 0.25 MG/5ML
INTRAVENOUS | Status: AC
  Filled 2021-01-02: qty 5

## 2021-01-02 MED ORDER — PALONOSETRON HCL INJECTION 0.25 MG/5ML
0.2500 mg | Freq: Once | INTRAVENOUS | Status: AC
  Administered 2021-01-02: 0.25 mg via INTRAVENOUS

## 2021-01-02 MED ORDER — SODIUM CHLORIDE 0.9 % IV SOLN
10.0000 mg | Freq: Once | INTRAVENOUS | Status: AC
  Administered 2021-01-02: 10 mg via INTRAVENOUS
  Filled 2021-01-02: qty 10

## 2021-01-02 MED ORDER — SODIUM CHLORIDE 0.9 % IV SOLN
1000.0000 mg/m2 | Freq: Once | INTRAVENOUS | Status: AC
  Administered 2021-01-02: 2014 mg via INTRAVENOUS
  Filled 2021-01-02: qty 52.97

## 2021-01-02 MED ORDER — SODIUM CHLORIDE 0.9 % IV SOLN
348.0000 mg | Freq: Once | INTRAVENOUS | Status: AC
  Administered 2021-01-02: 350 mg via INTRAVENOUS
  Filled 2021-01-02: qty 35

## 2021-01-02 MED ORDER — SODIUM CHLORIDE 0.9% FLUSH
10.0000 mL | INTRAVENOUS | Status: DC | PRN
Start: 2021-01-02 — End: 2021-01-02
  Administered 2021-01-02: 10 mL
  Filled 2021-01-02: qty 10

## 2021-01-02 MED ORDER — SODIUM CHLORIDE 0.9 % IV SOLN
Freq: Once | INTRAVENOUS | Status: AC
  Filled 2021-01-02: qty 250

## 2021-01-02 NOTE — Progress Notes (Signed)
Hematology and Oncology Follow Up Visit  CADEN FATICA [id number] [birth year] 65 y.o. 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   Prior Therapy:  She is status post retroperitoneal lymph node biopsy completed on 10/24/2020 which showed metastatic carcinoma.  Current therapy: Chemotherapy utilizing carboplatin and gemcitabine started on November 22, 2020.  She is here for day 1 of cycle 3.   Interim History: Ms. Borromeo is here for return evaluation.  Since the last visit, she reports no major changes in her health.  She has tolerated the last cycle of chemotherapy without any major complications.  She does report some fatigue and tiredness after day 1 of each cycle.  She tolerates date much better.  She denies any nausea vomiting or abdominal pain.  She does report some hematuria and dysuria related to her ureteral stents.     Medications: Unchanged on review. Current Outpatient Medications  Medication Sig Dispense Refill  . acetaminophen (TYLENOL) 500 MG tablet Take 1,000 mg by mouth every 8 (eight) hours as needed for moderate pain.     Marland Kitchen albuterol (PROAIR HFA) 108 (90 Base) MCG/ACT inhaler INHALE 1 TO 2 PUFFS BY MOUTH INTO THE LUNGS EVERY 4 HOURS AS NEEDED FOR WHEEZING OR SHORTNESS OF BREATH (Patient taking differently: Inhale 1-2 puffs into the lungs every 4 (four) hours as needed for wheezing or shortness of breath. INHALE 1 TO 2 PUFFS BY MOUTH INTO THE LUNGS EVERY 4 HOURS AS NEEDED FOR WHEEZING OR SHORTNESS OF BREATH) 8.5 g 1  . amLODipine (NORVASC) 5 MG tablet Take 1 tablet (5 mg total) by mouth daily. appt for refills 90 tablet 0  . ARIPiprazole 10 MG TABS Take 10 mg by mouth daily. 90 tablet 1  . ascorbic acid (VITAMIN C) 500 MG tablet Take 1,000 mg by mouth every evening.     .  ASPIRIN LOW DOSE 81 MG EC tablet TAKE 1 TABLET BY MOUTH DAILY. (Patient taking differently: Take 81 mg by mouth daily.) 90 tablet 3  . buPROPion (WELLBUTRIN XL) 300 MG 24 hr tablet Take 1 tablet (300 mg total) by mouth daily. 90 tablet 1  . BYSTOLIC 20 MG TABS TAKE 1 TABLET (20 MG TOTAL) BY MOUTH DAILY. (Patient taking differently: Take 20 mg by mouth daily.) 90 tablet 1  . Cholecalciferol (VITAMIN D) 125 MCG (5000 UT) CAPS Take 5,000 Units by mouth every evening.     . clonazePAM (KLONOPIN) 0.5 MG tablet Take 1 tablet (0.5 mg total) by mouth at bedtime. 30 tablet 2  . Coenzyme Q10 (CO Q-10) 200 MG CAPS Take 200 mg by mouth every evening.     . fexofenadine (ALLEGRA) 180 MG tablet Take 180 mg by mouth every evening.     . furosemide (LASIX) 20 MG tablet Take 20mg  on day 1 and 2 then every other day. (Patient taking differently: Take 20 mg by mouth as needed for edema.) 30 tablet 3  . gabapentin (NEURONTIN) 300 MG capsule 1  qam   4   qhs (Patient taking differently: Take 300-1,200 mg by mouth See admin instructions. Take 300 mg by mouth in the morning and afternoon and 1200 mg at night) 150 capsule 5  . GLUCOSAMINE-CHONDROITIN PO Take 2 tablets by mouth every evening.     Marland Kitchen guaiFENesin-dextromethorphan (ROBITUSSIN DM) 100-10 MG/5ML syrup Take 10 mLs by mouth every  4 (four) hours as needed for cough. 473 mL 0  . lidocaine-prilocaine (EMLA) cream Apply 1 application topically as needed. 30 g 0  . nitrofurantoin (MACRODANTIN) 100 MG capsule Take 1 capsule (100 mg total) by mouth at bedtime. 90 capsule 1  . Omega-3 Fatty Acids (FISH OIL) 1200 MG CAPS Take 1,200 mg by mouth every evening.     Marland Kitchen omeprazole (PRILOSEC) 20 MG capsule Take 1 capsule (20 mg total) by mouth daily. appt for further refills 90 capsule 0  . ondansetron (ZOFRAN-ODT) 4 MG disintegrating tablet Take 1 tablet (4 mg total) by mouth every 8 (eight) hours as needed for nausea or vomiting. 12 tablet 0  . oxyCODONE-acetaminophen (PERCOCET)  10-325 MG tablet Take 1 tablet by mouth every 8 (eight) hours as needed for pain. 90 tablet 0  . simvastatin (ZOCOR) 20 MG tablet TAKE 1 TABLET (20 MG TOTAL) BY MOUTH DAILY. (Patient taking differently: Take 20 mg by mouth daily at 6 PM.) 90 tablet 1  . thiamine 100 MG tablet Take 1 tablet (100 mg total) by mouth daily. 30 tablet 0  . vitamin B-12 (CYANOCOBALAMIN) 1000 MCG tablet Take 2,000 mcg by mouth every evening.     . Vitamin E 180 MG (400 UNIT) CAPS Take 400 Units by mouth every evening.      No current facility-administered medications for this visit.   Facility-Administered Medications Ordered in Other Visits  Medication Dose Route Frequency Provider Last Rate Last Admin  . sodium chloride flush (NS) 0.9 % injection 10 mL  10 mL Intracatheter Once Wyatt Portela, MD         Allergies:  Allergies  Allergen Reactions  . Metformin And Related Other (See Comments)    Lactic acidosis   . Penicillins Hives and Rash    Reports hives to penicillin and amoxicillin as an adult "a long time ago." Has tolerated cefdinir (09/2020) and cefepime (10/2020) before.      Physical Exam:  Blood pressure (!) 160/79, pulse 74, temperature 98.2 F (36.8 C), temperature source Tympanic, resp. rate 20, height 5\' 6"  (1.676 m), weight 192 lb (87.1 kg), SpO2 98 %.   ECOG: 1    General appearance: Alert, awake without any distress. Head: Atraumatic without abnormalities Oropharynx: Without any thrush or ulcers. Eyes: No scleral icterus. Lymph nodes: No lymphadenopathy noted in the cervical, supraclavicular, or axillary nodes Heart:regular rate and rhythm, without any murmurs or gallops.   Lung: Clear to auscultation without any rhonchi, wheezes or dullness to percussion. Abdomin: Soft, nontender without any shifting dullness or ascites. Musculoskeletal: No clubbing or cyanosis. Neurological: No motor or sensory deficits. Skin: No rashes or lesions.      Lab Results: Lab Results   Component Value Date   WBC 5.1 12/19/2020   HGB 9.6 (L) 12/19/2020   HCT 30.7 (L) 12/19/2020   MCV 91.9 12/19/2020   PLT 431 (H) 12/19/2020     Chemistry      Component Value Date/Time   NA 135 12/19/2020 1143   NA 142 04/26/2018 1333   K 4.3 12/19/2020 1143   CL 105 12/19/2020 1143   CO2 21 (L) 12/19/2020 1143   BUN 19 12/19/2020 1143   BUN 22 04/26/2018 1333   CREATININE 1.27 (H) 12/19/2020 1143   CREATININE 0.83 09/22/2020 1330   GLU 87 02/07/2013 0000      Component Value Date/Time   CALCIUM 9.3 12/19/2020 1143   ALKPHOS 81 12/19/2020 1143   AST 23 12/19/2020 1143  ALT 18 12/19/2020 1143   BILITOT 0.4 12/19/2020 1143        Impression and Plan:    65 year old woman with:  1.    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    She is currently receiving systemic chemotherapy utilizing carboplatin and gemcitabine without complications.  Risks and benefits were reviewed including complications such as nausea, vomiting, myelosuppression among others.  Alternative treatment options including immunotherapy and Padcev were reiterated.  The plan is to update her staging scans before the next visit cycle.  Depending on the the results of the scan and her response, subsequent treatments will be decided.  Completing 6 cycles of therapy and switching to maintenance with immunotherapy is very likely scenario.  2. IV access:Port-A-Cath currently in place without any complications.  3. Antiemetics: Compazine is available to her without any nausea or vomiting.  4. Renal function surveillance:We will continue to monitor on platinum therapy.  Creatinine clearance is less than 50 cc/min.  She has ureteral stents in place and will be replaced in the future.  5. Goals of care:  Therapy remains palliative although aggressive measures are warranted given her reasonable performance status.  6.   Anemia: Related to malignancy and chemotherapy.  Hemoglobin has been stable without any need for transfusion or growth factor support.  7. Follow-up: She will return in 1 week to complete the current cycle and 3 weeks for the start of the next cycle.   30  minutes were dedicated to this visit.  The time was spent on reviewing laboratory data, disease status update, discussing future treatment options and follow-up plan discussion.   Zola Button, MD 1/20/202212:12 PM

## 2021-01-02 NOTE — Patient Instructions (Signed)
[facility] Discharge Instructions for Patients Receiving Chemotherapy  Today you received the following chemotherapy agents Gemcitabine (GEMZAR) & Carboplatin (PARAPLATIN).  To help prevent nausea and vomiting after your treatment, we encourage you to take your nausea medication as prescribed.   If you develop nausea and vomiting that is not controlled by your nausea medication, call the clinic.   BELOW ARE SYMPTOMS THAT SHOULD BE REPORTED IMMEDIATELY:  *FEVER GREATER THAN 100.5 F  *CHILLS WITH OR WITHOUT FEVER  NAUSEA AND VOMITING THAT IS NOT CONTROLLED WITH YOUR NAUSEA MEDICATION  *UNUSUAL SHORTNESS OF BREATH  *UNUSUAL BRUISING OR BLEEDING  TENDERNESS IN MOUTH AND THROAT WITH OR WITHOUT PRESENCE OF ULCERS  *URINARY PROBLEMS  *BOWEL PROBLEMS  UNUSUAL RASH Items with * indicate a potential emergency and should be followed up as soon as possible.  Feel free to call the clinic should you have any questions or concerns. The clinic phone number is [phone].  Please show the CHEMO ALERT CARD at check-in to the Emergency Department and triage nurse.   

## 2021-01-02 NOTE — Assessment & Plan Note (Signed)
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 Return to see me in a month.

## 2021-01-02 NOTE — Progress Notes (Signed)
    Procedures performed today:    Procedure:  Injection of left subacromial bursa Consent obtained and verified. Time-out conducted. Noted no overlying erythema, induration, or other signs of local infection. Skin prepped in a sterile fashion. Topical analgesic spray: Ethyl chloride. Completed without difficulty. Meds: 1 cc lidocaine, 1 cc kenalog 40, 1 cc bupivacaine injected easily Advised to call if fevers/chills, erythema, induration, drainage, or persistent bleeding.  Procedure:  Injection of left knee Consent obtained and verified. Time-out conducted. Noted no overlying erythema, induration, or other signs of local infection. Skin prepped in a sterile fashion. Topical analgesic spray: Ethyl chloride. Completed without difficulty. Meds: 1 cc kenalog 40, 2 cc lidocaine, 2 cc bupivacaine injected easily Advised to call if fevers/chills, erythema, induration, drainage, or persistent bleeding.  Independent interpretation of notes and tests performed by another provider:   None.  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 Return to see me in a month.  Primary osteoarthritis of left knee Left knee has also not improved with conservative treatment, unguided injection today, return to see me in a month for this.    ___________________________________________ Gwen Her. Dianah Field, M.D., ABFM., CAQSM. Primary Care and [place] [employer] of [employer] [facility] [employer]

## 2021-01-02 NOTE — Assessment & Plan Note (Signed)
Left knee has also not improved with conservative treatment, unguided injection today, return to see me in a month for this.

## 2021-01-09 ENCOUNTER — Other Ambulatory Visit: Payer: Self-pay

## 2021-01-09 ENCOUNTER — Inpatient Hospital Stay: Payer: No Typology Code available for payment source

## 2021-01-09 VITALS — BP 154/88 | HR 56 | Temp 97.6°F | Resp 16 | Wt 184.2 lb

## 2021-01-09 DIAGNOSIS — C669 Malignant neoplasm of unspecified ureter: Secondary | ICD-10-CM

## 2021-01-09 DIAGNOSIS — C801 Malignant (primary) neoplasm, unspecified: Secondary | ICD-10-CM

## 2021-01-09 DIAGNOSIS — Z5111 Encounter for antineoplastic chemotherapy: Secondary | ICD-10-CM | POA: Diagnosis not present

## 2021-01-09 DIAGNOSIS — R600 Localized edema: Secondary | ICD-10-CM

## 2021-01-09 DIAGNOSIS — Z95828 Presence of other vascular implants and grafts: Secondary | ICD-10-CM

## 2021-01-09 LAB — CMP (CANCER CENTER ONLY)
ALT: 22 U/L (ref 0–44)
AST: 32 U/L (ref 15–41)
Albumin: 4 g/dL (ref 3.5–5.0)
Alkaline Phosphatase: 79 U/L (ref 38–126)
Anion gap: 8 (ref 5–15)
BUN: 23 mg/dL (ref 8–23)
CO2: 22 mmol/L (ref 22–32)
Calcium: 8.9 mg/dL (ref 8.9–10.3)
Chloride: 108 mmol/L (ref 98–111)
Creatinine: 1.17 mg/dL — ABNORMAL HIGH (ref 0.44–1.00)
GFR, Estimated: 52 mL/min — ABNORMAL LOW (ref >60)
Glucose, Bld: 86 mg/dL (ref 70–99)
Potassium: 4.4 mmol/L (ref 3.5–5.1)
Sodium: 138 mmol/L (ref 135–145)
Total Bilirubin: <0.2 mg/dL — ABNORMAL LOW (ref 0.3–1.2)
Total Protein: 8.1 g/dL (ref 6.5–8.1)

## 2021-01-09 LAB — CBC WITH DIFFERENTIAL (CANCER CENTER ONLY)
Abs Immature Granulocytes: 0.04 10*3/uL (ref 0.00–0.07)
Basophils Absolute: 0.1 10*3/uL (ref 0.0–0.1)
Basophils Relative: 2 %
Eosinophils Absolute: 0.1 10*3/uL (ref 0.0–0.5)
Eosinophils Relative: 2 %
HCT: 29.2 % — ABNORMAL LOW (ref 36.0–46.0)
Hemoglobin: 9.1 g/dL — ABNORMAL LOW (ref 12.0–15.0)
Immature Granulocytes: 1 %
Lymphocytes Relative: 39 %
Lymphs Abs: 1.6 10*3/uL (ref 0.7–4.0)
MCH: 29.9 pg (ref 26.0–34.0)
MCHC: 31.2 g/dL (ref 30.0–36.0)
MCV: 96.1 fL (ref 80.0–100.0)
Monocytes Absolute: 0.5 10*3/uL (ref 0.1–1.0)
Monocytes Relative: 12 %
Neutro Abs: 1.9 10*3/uL (ref 1.7–7.7)
Neutrophils Relative %: 44 %
Platelet Count: 479 10*3/uL — ABNORMAL HIGH (ref 150–400)
RBC: 3.04 MIL/uL — ABNORMAL LOW (ref 3.87–5.11)
RDW: 18.7 % — ABNORMAL HIGH (ref 11.5–15.5)
WBC Count: 4.1 10*3/uL (ref 4.0–10.5)
nRBC: 0 % (ref 0.0–0.2)

## 2021-01-09 LAB — SAMPLE TO BLOOD BANK

## 2021-01-09 MED ORDER — SODIUM CHLORIDE 0.9 % IV SOLN
1000.0000 mg/m2 | Freq: Once | INTRAVENOUS | Status: AC
  Administered 2021-01-09: 2014 mg via INTRAVENOUS
  Filled 2021-01-09: qty 52.97

## 2021-01-09 MED ORDER — SODIUM CHLORIDE 0.9% FLUSH
10.0000 mL | Freq: Once | INTRAVENOUS | Status: AC
  Administered 2021-01-09: 10 mL
  Filled 2021-01-09: qty 10

## 2021-01-09 MED ORDER — HEPARIN SOD (PORK) LOCK FLUSH 100 UNIT/ML IV SOLN
500.0000 [IU] | Freq: Once | INTRAVENOUS | Status: AC | PRN
  Administered 2021-01-09: 500 [IU]
  Filled 2021-01-09: qty 5

## 2021-01-09 MED ORDER — PALONOSETRON HCL INJECTION 0.25 MG/5ML
INTRAVENOUS | Status: AC
  Filled 2021-01-09: qty 5

## 2021-01-09 MED ORDER — SODIUM CHLORIDE 0.9 % IV SOLN
Freq: Once | INTRAVENOUS | Status: AC
  Filled 2021-01-09: qty 250

## 2021-01-09 MED ORDER — PALONOSETRON HCL INJECTION 0.25 MG/5ML
0.2500 mg | Freq: Once | INTRAVENOUS | Status: AC
  Administered 2021-01-09: 0.25 mg via INTRAVENOUS

## 2021-01-09 MED ORDER — SODIUM CHLORIDE 0.9% FLUSH
10.0000 mL | INTRAVENOUS | Status: DC | PRN
  Administered 2021-01-09: 10 mL
  Filled 2021-01-09: qty 10

## 2021-01-09 NOTE — Telephone Encounter (Signed)
Okay to take daily.  I pended the refill for this, please confirm pharmacy and then okay to send refill.

## 2021-01-09 NOTE — Telephone Encounter (Signed)
Pt called requesting if she can take the Lasix rx QD? Per pt, she feels much better when she takes the medication. She mentioned that it has helped to keep her blood pressure within normal limits. Pls advise, thanks.

## 2021-01-09 NOTE — Progress Notes (Signed)
Patient states she had an episode of chest pain on 01/08/2021.  Pain started in L chest and radiated around back and to the R ear.  Pain stopped approximately 10 minutes after taking tums.  No other episodes since then.

## 2021-01-09 NOTE — Patient Instructions (Signed)
[facility] Discharge Instructions for Patients Receiving Chemotherapy  Today you received the following chemotherapy agents Gemcitabine (GEMZAR).  To help prevent nausea and vomiting after your treatment, we encourage you to take your nausea medication as prescribed.   If you develop nausea and vomiting that is not controlled by your nausea medication, call the clinic.   BELOW ARE SYMPTOMS THAT SHOULD BE REPORTED IMMEDIATELY:  *FEVER GREATER THAN 100.5 F  *CHILLS WITH OR WITHOUT FEVER  NAUSEA AND VOMITING THAT IS NOT CONTROLLED WITH YOUR NAUSEA MEDICATION  *UNUSUAL SHORTNESS OF BREATH  *UNUSUAL BRUISING OR BLEEDING  TENDERNESS IN MOUTH AND THROAT WITH OR WITHOUT PRESENCE OF ULCERS  *URINARY PROBLEMS  *BOWEL PROBLEMS  UNUSUAL RASH Items with * indicate a potential emergency and should be followed up as soon as possible.  Feel free to call the clinic should you have any questions or concerns. The clinic phone number is [phone].  Please show the CHEMO ALERT CARD at check-in to the Emergency Department and triage nurse.   

## 2021-01-10 ENCOUNTER — Other Ambulatory Visit: Payer: Self-pay

## 2021-01-10 DIAGNOSIS — R600 Localized edema: Secondary | ICD-10-CM

## 2021-01-10 MED ORDER — FUROSEMIDE 20 MG PO TABS: ORAL_TABLET | ORAL | 3 refills | Status: DC

## 2021-01-10 MED FILL — FUROSEMIDE 20 MG TABS: 20 | 90 days supply | Qty: 90 | Fill #0

## 2021-01-10 NOTE — Telephone Encounter (Signed)
Task completed. Pt has been updated regarding change in Lasix rx. Per pt's request, rx sent to [facility].

## 2021-01-13 MED FILL — buPROPion HCL ER (XL) 300 M: 300 | 30 days supply | Qty: 30 | Fill #1

## 2021-01-13 MED FILL — NEBIVOLOL HCL 20 MG TABS: 20 | 90 days supply | Qty: 90 | Fill #0

## 2021-01-16 ENCOUNTER — Ambulatory Visit (HOSPITAL_COMMUNITY)
Admission: RE | Admit: 2021-01-16 | Discharge: 2021-01-16 | Disposition: A | Discharge status: Home / Self Care | Payer: No Typology Code available for payment source | Source: Ambulatory Visit | Attending: Oncology | Admitting: Oncology

## 2021-01-16 ENCOUNTER — Other Ambulatory Visit: Payer: Self-pay

## 2021-01-16 DIAGNOSIS — C669 Malignant neoplasm of unspecified ureter: Secondary | ICD-10-CM | POA: Diagnosis not present

## 2021-01-16 MED ORDER — IOHEXOL 300 MG/ML  SOLN
100.0000 mL | Freq: Once | INTRAMUSCULAR | Status: AC | PRN
  Administered 2021-01-16: 100 mL via INTRAVENOUS

## 2021-01-17 ENCOUNTER — Encounter (HOSPITAL_BASED_OUTPATIENT_CLINIC_OR_DEPARTMENT_OTHER): Payer: Self-pay | Admitting: Urology

## 2021-01-17 ENCOUNTER — Other Ambulatory Visit: Payer: Self-pay

## 2021-01-17 ENCOUNTER — Telehealth: Payer: Self-pay

## 2021-01-17 NOTE — Progress Notes (Addendum)
Spoke w/ via phone for pre-op interview---pt Lab needs dos----  I stat              Lab results------ekg 10-22-2020 epic COVID test ------01-18-2021 1000 Arrive at -------930 am 01-21-2021  NPO after MN NO Solid Food.  Clear liquids from MN until--830 am then npo Medications to take morning of surgery ----- Diabetic medication -----albuterol inhaler bring use prn, gabapentin, bupropion, bystolic, norvasc, omeprazole, mybetriq Patient Special Instructions -----none Pre-Op special Istructions -----none Patient verbalized understanding of instructions that were given at this phone interview. Patient denies shortness of breath, chest pain, fever, cough at this phone interview.  Pt has left ear daith piercing and nose ring she cannot remove

## 2021-01-17 NOTE — Telephone Encounter (Signed)
-----   Message from Wyatt Portela, MD sent at 01/17/2021  1:01 PM EST ----- Regarding: RE: Requesting scan results Please let the patient know that I need to review with radiology and will discuss during her next appointment next week. CT scan is stable overall but needs further review.  Thanks ----- Message ----- From: Lin Givens, RN Sent: 01/17/2021   1:00 PM EST To: Wyatt Portela, MD, Burna Mortimer, CMA Subject: Requesting scan results                        Patient called requesting scan results and asked to be called

## 2021-01-17 NOTE — Telephone Encounter (Signed)
Patient has been made aware of Dr. Hazeline Junker response and verbalized understanding.

## 2021-01-18 ENCOUNTER — Other Ambulatory Visit (HOSPITAL_COMMUNITY)
Admission: RE | Admit: 2021-01-18 | Discharge: 2021-01-18 | Disposition: A | Discharge status: Home / Self Care | Payer: No Typology Code available for payment source | Source: Ambulatory Visit | Attending: Urology | Admitting: Urology

## 2021-01-18 DIAGNOSIS — Z01812 Encounter for preprocedural laboratory examination: Secondary | ICD-10-CM | POA: Diagnosis not present

## 2021-01-18 DIAGNOSIS — Z20822 Contact with and (suspected) exposure to covid-19: Secondary | ICD-10-CM | POA: Diagnosis not present

## 2021-01-18 LAB — SARS CORONAVIRUS 2 (TAT 6-24 HRS): SARS Coronavirus 2: NEGATIVE

## 2021-01-21 ENCOUNTER — Ambulatory Visit (HOSPITAL_BASED_OUTPATIENT_CLINIC_OR_DEPARTMENT_OTHER)
Admission: RE | Admit: 2021-01-21 | Discharge: 2021-01-21 | Disposition: A | Discharge status: Home / Self Care | Payer: No Typology Code available for payment source | Attending: Urology | Admitting: Urology

## 2021-01-21 ENCOUNTER — Encounter (HOSPITAL_BASED_OUTPATIENT_CLINIC_OR_DEPARTMENT_OTHER): Payer: Self-pay | Admitting: Urology

## 2021-01-21 ENCOUNTER — Ambulatory Visit (HOSPITAL_BASED_OUTPATIENT_CLINIC_OR_DEPARTMENT_OTHER): Payer: No Typology Code available for payment source | Admitting: Certified Registered"

## 2021-01-21 ENCOUNTER — Encounter (HOSPITAL_BASED_OUTPATIENT_CLINIC_OR_DEPARTMENT_OTHER)
Admission: RE | Disposition: A | Discharge status: Home / Self Care | Payer: Self-pay | Source: Home / Self Care | Attending: Urology

## 2021-01-21 DIAGNOSIS — C799 Secondary malignant neoplasm of unspecified site: Secondary | ICD-10-CM | POA: Diagnosis not present

## 2021-01-21 DIAGNOSIS — C678 Malignant neoplasm of overlapping sites of bladder: Secondary | ICD-10-CM | POA: Insufficient documentation

## 2021-01-21 DIAGNOSIS — Z888 Allergy status to other drugs, medicaments and biological substances status: Secondary | ICD-10-CM | POA: Diagnosis not present

## 2021-01-21 DIAGNOSIS — Z7982 Long term (current) use of aspirin: Secondary | ICD-10-CM | POA: Insufficient documentation

## 2021-01-21 DIAGNOSIS — Z87891 Personal history of nicotine dependence: Secondary | ICD-10-CM | POA: Diagnosis not present

## 2021-01-21 DIAGNOSIS — Z466 Encounter for fitting and adjustment of urinary device: Secondary | ICD-10-CM | POA: Insufficient documentation

## 2021-01-21 DIAGNOSIS — Z8616 Personal history of COVID-19: Secondary | ICD-10-CM | POA: Diagnosis not present

## 2021-01-21 DIAGNOSIS — N1330 Unspecified hydronephrosis: Secondary | ICD-10-CM | POA: Insufficient documentation

## 2021-01-21 DIAGNOSIS — Z79899 Other long term (current) drug therapy: Secondary | ICD-10-CM | POA: Diagnosis not present

## 2021-01-21 DIAGNOSIS — Z880 Allergy status to penicillin: Secondary | ICD-10-CM | POA: Diagnosis not present

## 2021-01-21 HISTORY — PX: CYSTOSCOPY W/ URETERAL STENT PLACEMENT: SHX1429

## 2021-01-21 HISTORY — DX: Disorder of the skin and subcutaneous tissue, unspecified: L98.9

## 2021-01-21 HISTORY — PX: TRANSURETHRAL RESECTION OF BLADDER TUMOR: SHX2575

## 2021-01-21 LAB — POCT I-STAT, CHEM 8
BUN: 24 mg/dL — ABNORMAL HIGH (ref 8–23)
Calcium, Ion: 1.28 mmol/L (ref 1.15–1.40)
Chloride: 104 mmol/L (ref 98–111)
Creatinine, Ser: 1.2 mg/dL — ABNORMAL HIGH (ref 0.44–1.00)
Glucose, Bld: 91 mg/dL (ref 70–99)
HCT: 31 % — ABNORMAL LOW (ref 36.0–46.0)
Hemoglobin: 10.5 g/dL — ABNORMAL LOW (ref 12.0–15.0)
Potassium: 4.1 mmol/L (ref 3.5–5.1)
Sodium: 143 mmol/L (ref 135–145)
TCO2: 25 mmol/L (ref 22–32)

## 2021-01-21 SURGERY — CYSTOSCOPY, FLEXIBLE, WITH STENT REPLACEMENT
Anesthesia: General | Laterality: Bilateral

## 2021-01-21 MED ORDER — DEXAMETHASONE SODIUM PHOSPHATE 10 MG/ML IJ SOLN
INTRAMUSCULAR | Status: AC
  Filled 2021-01-21: qty 1

## 2021-01-21 MED ORDER — OXYCODONE HCL 5 MG PO TABS
5.0000 mg | ORAL_TABLET | Freq: Once | ORAL | Status: AC | PRN
Start: 2021-01-21 — End: 2021-01-21
  Administered 2021-01-21: 5 mg via ORAL

## 2021-01-21 MED ORDER — PROPOFOL 10 MG/ML IV BOLUS
INTRAVENOUS | Status: AC
  Filled 2021-01-21: qty 20

## 2021-01-21 MED ORDER — ONDANSETRON HCL 4 MG/2ML IJ SOLN
INTRAMUSCULAR | Status: AC
  Filled 2021-01-21: qty 2

## 2021-01-21 MED ORDER — PROPOFOL 10 MG/ML IV BOLUS
INTRAVENOUS | Status: DC | PRN
  Administered 2021-01-21: 120 mg via INTRAVENOUS

## 2021-01-21 MED ORDER — ONDANSETRON HCL 4 MG/2ML IJ SOLN
INTRAMUSCULAR | Status: DC | PRN
  Administered 2021-01-21: 4 mg via INTRAVENOUS

## 2021-01-21 MED ORDER — LIDOCAINE 2% (20 MG/ML) 5 ML SYRINGE
INTRAMUSCULAR | Status: DC | PRN
  Administered 2021-01-21: 60 mg via INTRAVENOUS

## 2021-01-21 MED ORDER — IOHEXOL 300 MG/ML  SOLN
INTRAMUSCULAR | Status: DC | PRN
  Administered 2021-01-21: 40 mL via URETHRAL

## 2021-01-21 MED ORDER — SODIUM CHLORIDE 0.9 % IR SOLN
Status: DC | PRN
  Administered 2021-01-21: 6000 mL via INTRAVESICAL

## 2021-01-21 MED ORDER — LIDOCAINE HCL (PF) 2 % IJ SOLN
INTRAMUSCULAR | Status: AC
  Filled 2021-01-21: qty 5

## 2021-01-21 MED ORDER — MIDAZOLAM HCL 5 MG/5ML IJ SOLN
INTRAMUSCULAR | Status: DC | PRN
  Administered 2021-01-21: 2 mg via INTRAVENOUS

## 2021-01-21 MED ORDER — OXYCODONE HCL 5 MG PO TABS
ORAL_TABLET | ORAL | Status: AC
  Filled 2021-01-21: qty 1

## 2021-01-21 MED ORDER — FENTANYL CITRATE (PF) 100 MCG/2ML IJ SOLN
INTRAMUSCULAR | Status: DC | PRN
  Administered 2021-01-21: 50 ug via INTRAVENOUS

## 2021-01-21 MED ORDER — CIPROFLOXACIN IN D5W 400 MG/200ML IV SOLN
INTRAVENOUS | Status: AC
  Filled 2021-01-21: qty 200

## 2021-01-21 MED ORDER — FENTANYL CITRATE (PF) 100 MCG/2ML IJ SOLN
INTRAMUSCULAR | Status: AC
  Filled 2021-01-21: qty 2

## 2021-01-21 MED ORDER — ONDANSETRON HCL 4 MG/2ML IJ SOLN: 4.0000 mg | Freq: Once | INTRAMUSCULAR | Status: DC | PRN

## 2021-01-21 MED ORDER — FENTANYL CITRATE (PF) 100 MCG/2ML IJ SOLN
25.0000 ug | INTRAMUSCULAR | Status: DC | PRN
  Administered 2021-01-21: 25 ug via INTRAVENOUS

## 2021-01-21 MED ORDER — CIPROFLOXACIN IN D5W 400 MG/200ML IV SOLN
400.0000 mg | INTRAVENOUS | Status: AC
  Administered 2021-01-21: 400 mg via INTRAVENOUS

## 2021-01-21 MED ORDER — DEXAMETHASONE SODIUM PHOSPHATE 10 MG/ML IJ SOLN
INTRAMUSCULAR | Status: DC | PRN
  Administered 2021-01-21: 5 mg via INTRAVENOUS

## 2021-01-21 MED ORDER — MIDAZOLAM HCL 2 MG/2ML IJ SOLN
INTRAMUSCULAR | Status: AC
  Filled 2021-01-21: qty 2

## 2021-01-21 MED ORDER — LACTATED RINGERS IV SOLN: INTRAVENOUS | Status: DC

## 2021-01-21 MED ORDER — OXYCODONE HCL 5 MG/5ML PO SOLN: 5.0000 mg | Freq: Once | ORAL | Status: AC | PRN

## 2021-01-21 MED FILL — clonazePAM 0.5 MG TABS: 0.5 | 30 days supply | Qty: 30 | Fill #1

## 2021-01-21 MED FILL — TAMSULOSIN HCL 0.4 MG CAP: 0.4 | 30 days supply | Qty: 30 | Fill #1

## 2021-01-21 MED FILL — MYRBETRIQ ER 25 MG TABLET: 25 | 30 days supply | Qty: 30 | Fill #1

## 2021-01-21 SURGICAL SUPPLY — 21 items
BAG DRAIN URO-CYSTO SKYTR STRL (DRAIN) ×3 IMPLANT
BAG DRN UROCATH (DRAIN) ×2
BASKET ZERO TIP NITINOL 2.4FR (BASKET) IMPLANT
BSKT STON RTRVL ZERO TP 2.4FR (BASKET)
CATH URET 5FR 28IN OPEN ENDED (CATHETERS) ×3 IMPLANT
CLOTH BEACON ORANGE TIMEOUT ST (SAFETY) ×3 IMPLANT
DRSG TEGADERM 4X4.75 (GAUZE/BANDAGES/DRESSINGS) IMPLANT
EXTRACTOR STONE 1.7FRX115CM (UROLOGICAL SUPPLIES) IMPLANT
GLOVE SURG ENC MOIS LTX SZ6.5 (GLOVE) ×3 IMPLANT
GOWN STRL REUS W/TWL LRG LVL3 (GOWN DISPOSABLE) ×3 IMPLANT
GUIDEWIRE STR DUAL SENSOR (WIRE) ×3 IMPLANT
IV NS IRRIG 3000ML ARTHROMATIC (IV SOLUTION) ×3 IMPLANT
KIT TURNOVER CYSTO (KITS) ×3 IMPLANT
LOOP CUT BIPOLAR 24F LRG (ELECTROSURGICAL) ×3 IMPLANT
MANIFOLD NEPTUNE II (INSTRUMENTS) ×3 IMPLANT
PACK CYSTO (CUSTOM PROCEDURE TRAY) ×3 IMPLANT
STENT URET 6FRX26 CONTOUR (STENTS) ×6 IMPLANT
TRACTIP FLEXIVA PULS ID 200XHI (Laser) IMPLANT
TRACTIP FLEXIVA PULSE ID 200 (Laser)
TUBE CONNECTING 12X1/4 (SUCTIONS) ×3 IMPLANT
TUBING UROLOGY SET (TUBING) IMPLANT

## 2021-01-21 NOTE — H&P (Signed)
CC/HPI: cc: bilateral hydronephrosis   12/26/20: 65 year old female who is currently being managed by our office for bilateral hydronephrosis with ureteral stents last exchanged in July of 2020. 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 She was hospitalized in November for sepsis related to PNA. During this time she underwent a CT scan of her abdomen, chest and pelvis. This did reveal bilateral stents with continued bilateral hydronephrosis. She began Chemotherapy one month ago and has tolerated this failry well. Dr. Alen Blew, oncology has plans to rescan her in the next 4 weeks. She developed bladder spasms and lower left sided discomfort, she was seen by [facility] on 12/21/20. Her urine culture grew no growth. She presents today with the same symptoms. KUB is not concerning for STENT dislodgement. URine is not concerning for infection. She does not have Tamsulosinor myrbetriq at home. She denies fevers, chills and nausea/ vomiting.   09/09/20: 65 year old woman initially seen for incidental finding of asymptomatic right hydronephrosis which then progressed to bilateral hydronephrosis and underwent diagnostic ureteroscopy x2 the last being 07/05/2020. Biopsies will also ultimately negative for malignancy and we discussed a trial of steroids to see if this was some usual inflammatory process. Repeat CT scan of the abdomen pelvis were performed today to reassess hydronephrosis and inflammation.   08/07/20: 65 year old woman who underwent bilateral diagnostic ureteroscopy on 07/05/2020. The pathology report showed benign tissue. She has bilateral stents in place and reports that she is not tolerating them well and feels this very raw and "on fire" on the inside. She denies fevers, chills, nausea and vomiting. She endorses frequency, dysuria and urgency.   06/04/20: 65 year old woman initially seen last month for incidental finding of asymptomatic right  hydronephrosis. Since then, she has developed bilateral flank pain and been treated for pyelonephritis x2. She has had 2 CT scans and a renal ultrasound both which show right greater than left hydronephrosis and inflammation of the proximal ureter. There is also concern for inflammation of duodenum that is possibly causing inflammation of the ureters. Patient most recently went back to urgent care yesterday with acute onset right flank pain. Ultrasound at that point showed bilateral mild hydronephrosis. She is currently on nitrofurantoin for suspected infection. She did have endoscopy by GI with biopsy and we are waiting results.   04/29/20: 65 year old woman referred for right hydronephrosis without findings of stones. She had incidental finding of hydronephrosis on an MRI that was obtained and then CT scan confirmed. She denies hematuria, acute onset flank pain or history of kidney stones. She did report which she describes as ovarian cramping last week. She does have nocturia every 2 hours but drinks mostly diet Dr. Malachi Bonds, few cups of coffee and tea.     ALLERGIES: Metformin penicillin    MEDICATIONS: Aspirin 81 mg tablet,chewable  Myrbetriq 25 mg tablet, extended release 24 hr 1 tablet PO Daily  Abilify  Bystolic  Hydrocodone-Acetaminophen 5 mg-325 mg tablet 1 tablet PO Q 6 H PRN flank pain  Klonopin  Neurontin  Norvasc  Restoril  Wellbutrin Sr     GU PSH: Cysto Uretero Biopsy Fulgura, Bilateral - 07/05/2020, Right - 06/11/2020 Cystoscopy Insert Stent, Bilateral - 07/05/2020, Bilateral - 06/11/2020 Locm 300-399Mg /Ml Iodine,1Ml - 09/09/2020, 05/02/2020     NON-GU PSH: Hand/finger Surgery Tonsillectomy     GU PMH: Hydronephrosis, CT reveals worsening left hydro and enhancing material in right renal pelvis concerning for malignancy. Overall she also has  increasing lymphadenopathy in retroperitoneum as well as edema in sacral/peri-rectal regions. The patient recently completed steroids with  taper and also had COVID still on O2. We discussed the findings and lack of certainty if this is malignancy vs inflammatory. Will see if IR can biopsy retroperitoneal LAD. Called patient and discussed this In detail. Stents to remain in place for now. - 09/09/2020, Based on noncontrast CT the abdomen pelvis is difficult to further evaluate ureters. We discussed obtaining a BMP to look at renal function and proceeding with a CT urogram. If hydronephrosis has resolved then no further investigating will be done however if hydronephrosis is still present we did discuss proceeding with cystoscopy, retrograde pyelogram and diagnostic ureteroscopy. I will follow-up the patient after the CT scan., - 8/36/6294 Right uncertain neoplasm of kidney - 09/09/2020 Flank Pain - 06/04/2020    NON-GU PMH: Anxiety Arthritis Depression Encounter for general adult medical examination without abnormal findings, Encounter for preventive health examination Hepatitis C Hypercholesterolemia Hypertension    FAMILY HISTORY: Kidney Stones - Grandfather Protein S Deficiency - Brother   SOCIAL HISTORY: Marital Status: Married Preferred Language: English; Ethnicity: Not Hispanic Or Latino; Race: White Current Smoking Status: Patient does not smoke anymore.   Tobacco Use Assessment Completed: Used Tobacco in last 30 days? Social Drinker.  Drinks 4+ caffeinated drinks per day.    REVIEW OF SYSTEMS:    GU Review Female:   Patient reports frequent urination, hard to postpone urination, and get up at night to urinate. Patient denies burning /pain with urination, leakage of urine, stream starts and stops, trouble starting your stream, have to strain to urinate, and being pregnant.  Gastrointestinal (Upper):   Patient denies nausea, vomiting, and indigestion/ heartburn.  Gastrointestinal (Lower):   Patient denies diarrhea and constipation.  Constitutional:   Patient denies fever, night sweats, weight loss, and fatigue.  Skin:    Patient denies skin rash/ lesion and itching.  Eyes:   Patient denies blurred vision and double vision.  Ears/ Nose/ Throat:   Patient denies sore throat and sinus problems.  Hematologic/Lymphatic:   Patient denies swollen glands and easy bruising.  Cardiovascular:   Patient denies leg swelling and chest pains.  Respiratory:   Patient denies cough and shortness of breath.  Endocrine:   Patient denies excessive thirst.  Neurological:   Patient denies headaches and dizziness.  Psychologic:   Patient denies depression and anxiety.   VITAL SIGNS:      12/26/2020 08:24 AM  Weight 185 lb / 83.91 kg  Height 66 in / 167.64 cm  BP 145/89 mmHg  Temperature 97.8 F / 36.5 C  BMI 29.9 kg/m   GU PHYSICAL EXAMINATION:    Breast: Symmetrical. No tenderness, no nipple discharge, no skin changes. No mass.  Digital Rectal Exam: Normal sphincter tone. No rectal mass.  External Genitalia: No hirsutism, no rash, no scarring, no cyst, no erythematous lesion, no papular lesion, no blanched lesion, no warty lesion. No edema.  Urethral Meatus: Normal size. Normal position. No discharge.  Urethra: No tenderness, no mass, no scarring. No hypermobility. No leakage.  Bladder: Normal to palpation, no tenderness, no mass, normal size.  Vagina: No atrophy, no stenosis. No rectocele. No cystocele. No enterocele.  Cervix: No inflammation, no discharge, no lesion, no tenderness, no wart.  Uterus: Normal size. Normal consistency. Normal position. No mobility. No descent.  Adnexa / Parametria: No tenderness. No adnexal mass. Normal left ovary. Normal right ovary.  Anus and Perineum: No hemorrhoids. No anal  stenosis. No rectal fissure, no anal fissure. No edema, no dimple, no perineal tenderness, no anal tenderness.   MULTI-SYSTEM PHYSICAL EXAMINATION:    Constitutional: Well-nourished. No physical deformities. Normally developed. Good grooming.  Neck: Neck symmetrical, not swollen. Normal tracheal position.   Respiratory: No labored breathing, no use of accessory muscles.   Cardiovascular: Normal temperature, normal extremity pulses, no swelling, no varicosities.  Lymphatic: No enlargement of neck, axillae, groin.  Skin: No paleness, no jaundice, no cyanosis. No lesion, no ulcer, no rash.  Neurologic / Psychiatric: Oriented to time, oriented to place, oriented to person. No depression, no anxiety, no agitation.  Gastrointestinal: No mass, no tenderness, no rigidity, non obese abdomen.  Eyes: Normal conjunctivae. Normal eyelids.  Ears, Nose, Mouth, and Throat: Left ear no scars, no lesions, no masses. Right ear no scars, no lesions, no masses. Nose no scars, no lesions, no masses. Normal hearing. Normal lips.  Musculoskeletal: Normal gait and station of head and neck.     PAST DATA REVIEW: None   PROCEDURES:         KUB - [id number]  A single view of the abdomen is obtained.      Patient confirmed No Neulasta OnPro Device.           Urinalysis w/Scope Dipstick Dipstick Cont'd Micro  Color: Orange Bilirubin: Invalid mg/dL WBC/hpf: 0 - 5/hpf  Appearance: Slightly Cloudy Ketones: Invalid mg/dL RBC/hpf: 20 - 40/hpf  Specific Gravity: Invalid Blood: Invalid ery/uL Bacteria: Rare (0-9/hpf)  pH: Invalid Protein: Invalid mg/dL Cystals: NS (Not Seen)  Glucose: Invalid mg/dL Urobilinogen: Invalid mg/dL Casts: NS (Not Seen)    Nitrites: Invalid Trichomonas: Not Present    Leukocyte Esterase: Invalid leu/uL Mucous: Not Present      Epithelial Cells: 0 - 5/hpf      Yeast: NS (Not Seen)      Sperm: Not Present    ASSESSMENT: None   PLAN:            Medications Refill Meds: Myrbetriq 25 mg tablet, extended release 24 hr 1 tablet PO Daily   #30  3 Refill(s)    New Meds: Tamsulosin Hcl 0.4 mg capsule 1 capsule PO Q HS   #30  3 Refill(s)            Orders Labs CULTURE, URINE  X-Rays: KUB          Schedule         Document Letter(s):  Created for Patient: Clinical Summary          Notes:   Urinalysis is not overlying concerning for an infectious process today. KUB shows good stent position and no signs of calcifications. Advised use of myrbetriq and tamsulosin and theses prescriptions were refilled today. Urine sent for culture. Discussed stent exchange, the paper work was handed to the scheduler after discussion with Dr. Claudia Desanctis. Johniece understands to call our office and update once herCT imaging has been completed by Dr. Alen Blew. She also understands to call our office with any concerns or voiding changes.

## 2021-01-21 NOTE — Anesthesia Postprocedure Evaluation (Signed)
Anesthesia Post Note  Patient: Mary Stark  Procedure(s) Performed: CYSTOSCOPY WITH STENT REPLACEMENT (Bilateral ) TRANSURETHRAL RESECTION OF BLADDER TUMOR (TURBT)     Patient location during evaluation: PACU Anesthesia Type: General Level of consciousness: awake and alert and oriented Pain management: pain level controlled Vital Signs Assessment: post-procedure vital signs reviewed and stable Respiratory status: spontaneous breathing, nonlabored ventilation and respiratory function stable Cardiovascular status: blood pressure returned to baseline and stable Postop Assessment: no apparent nausea or vomiting Anesthetic complications: no   No complications documented.  Last Vitals:  Vitals:   01/21/21 1045 01/21/21 1100  BP: 139/72 (!) 149/78  Pulse: (!) 56 (!) 54  Resp: 15 11  Temp:    SpO2: 96% 95%    Last Pain:  Vitals:   01/21/21 1100  TempSrc:   PainSc: 1                  FOSTER,MICHAEL A.

## 2021-01-21 NOTE — Interval H&P Note (Signed)
History and Physical Interval Note:  01/21/2021 8:57 AM  Mary Stark  has presented today for surgery, with the diagnosis of BILATERAL HYDRONEPHROSIS.  The various methods of treatment have been discussed with the patient and family. After consideration of risks, benefits and other options for treatment, the patient has consented to  Procedure(s) with comments: CYSTOSCOPY WITH STENT REPLACEMENT (Bilateral) - 1 HR as a surgical intervention.  The patient's history has been reviewed, patient examined, no change in status, stable for surgery.  I have reviewed the patient's chart and labs.  Questions were answered to the patient's satisfaction.     Eliberto Sole D Worthy Boschert

## 2021-01-21 NOTE — Anesthesia Procedure Notes (Signed)
Procedure Name: LMA Insertion Date/Time: 01/21/2021 9:38 AM Performed by: Gwyndolyn Saxon, CRNA Pre-anesthesia Checklist: Patient identified, Emergency Drugs available, Suction available and Patient being monitored Patient Re-evaluated:Patient Re-evaluated prior to induction Oxygen Delivery Method: Circle system utilized Preoxygenation: Pre-oxygenation with 100% oxygen Induction Type: IV induction Ventilation: Mask ventilation without difficulty LMA: LMA inserted LMA Size: 4.0 Number of attempts: 1 Airway Equipment and Method: Patient positioned with wedge pillow Placement Confirmation: positive ETCO2 and breath sounds checked- equal and bilateral Tube secured with: Tape Dental Injury: Teeth and Oropharynx as per pre-operative assessment

## 2021-01-21 NOTE — Anesthesia Preprocedure Evaluation (Signed)
Anesthesia Evaluation  Patient identified by MRN, date of birth, ID band Patient awake    Reviewed: Allergy & Precautions, NPO status , Patient's Chart, lab work & pertinent test results, reviewed documented beta blocker date and time   Airway Mallampati: II  TM Distance: >3 FB Neck ROM: Full    Dental no notable dental hx. (+) Teeth Intact   Pulmonary pneumonia, resolved, former smoker,  Hx/o Covid-19 pneumonia with respiratory failure 08/13/20   Pulmonary exam normal breath sounds clear to auscultation       Cardiovascular hypertension, Pt. on medications and Pt. on home beta blockers Normal cardiovascular exam Rhythm:Regular Rate:Normal     Neuro/Psych PSYCHIATRIC DISORDERS Anxiety Depression  Neuromuscular disease    GI/Hepatic GERD  Medicated and Controlled,(+)     substance abuse  alcohol use, Hepatitis -, C  Endo/Other  Hyperlipidemia  Renal/GU Renal InsufficiencyRenal diseaseHx/o renal calculi Bilateral hydronephrosis with bilateral ureteral stents in place   Urothelial Ca with retroperitoneal lymphadenopathy Stage IV, undergoing chemotherapy    Musculoskeletal  (+) Arthritis , Osteoarthritis,    Abdominal   Peds  Hematology  (+) anemia ,   Anesthesia Other Findings   Reproductive/Obstetrics                             Anesthesia Physical Anesthesia Plan  ASA: III  Anesthesia Plan: General   Post-op Pain Management:    Induction: Intravenous  PONV Risk Score and Plan: 4 or greater and Midazolam, Ondansetron and Treatment may vary due to age or medical condition  Airway Management Planned: LMA  Additional Equipment:   Intra-op Plan:   Post-operative Plan: Extubation in OR  Informed Consent: I have reviewed the patients History and Physical, chart, labs and discussed the procedure including the risks, benefits and alternatives for the proposed anesthesia with the  patient or authorized representative who has indicated his/her understanding and acceptance.     Dental advisory given  Plan Discussed with: CRNA and Anesthesiologist  Anesthesia Plan Comments:         Anesthesia Quick Evaluation

## 2021-01-21 NOTE — Op Note (Addendum)
PATIENT:  Mary Stark  Preoperative diagnosis:  1. Bilateral hydronephrosis 2. Bladder lesion  Postoperative diagnosis:  1. Bilateral hydronephrosis 2. Bladder lesion  Procedure:  1. Cystoscopy 2. Bilateral retrograde pyelography with interpretation  3. Bilateral ureteral stent exchange (6Fr x 26cm - no tether)  4. Transurethral resection of bladder tumor 1 cm  Surgeon: Jacalyn Lefevre, M.D.  Anesthesia: General  Complications: None  Findings: 1.  Normal urethra 2.  Bilateral ureteral stents seen emanating from bilateral orthotopic ureteral orifices 3.  Bilateral hydronephrosis seen on retrograde pyelogram without filling defect noted; this deviates from CT scan findings which show concern for possible renal pelvis mass however no defect was noted 4.  Bilateral ureteral stent exchange 6 [place] by 26 cm -no tether 5.  Small subcentimeter nodule adjacent to left ureteral orifice that corresponded to possible bladder mass seen on CT scan as well as slightly irregular mucosa noted at bladder neck  EBL: Minimal  Specimens: None  Indication: 65 year old woman with bilateral hydronephrosis and metastatic cancer with unknown primary here for stent exchange. Interval CT scan shows persistent left hydronephrosis, concern for right renal pelvis mass, and possible new bladder mass near left ureteral orifice.  Description of procedure:  The patient was taken to the operating room and general anesthesia was induced.  The patient was placed in the dorsal lithotomy position, prepped and draped in the usual sterile fashion, and preoperative antibiotics were administered. A preoperative time-out was performed.   Cystourethroscopy was performed.  The patient's urethra was examined and was normal. The bladder was then systematically examined in its entirety. There was small subcentimeter nodule just adjacent to the left ureteral orifice that corresponded to location of concern for possible  bladder lesion on CT scan.  A 26 French resectoscope was then used to resect this area to send it for biopsy.  It was then fulgurated for hemostasis.  In addition a second biopsy was taken with the resectoscope at the left bladder neck.  Again fulguration was used with the bipolar loop.  Next attention then turned to the left ureteral orifice and graspers were used to bring the stent to the urethral meatus.  A 0.38 sensor wire was then advanced up the left ureteral stent into the left renal pelvis.  The ureteral stent was removed.  A ureteral catheter was used to intubate the ureteral orifice.  Full-strength Omnipaque contrast was injected through the ureteral catheter and a retrograde pyelogram was performed.  A 0.038 sensor guidewire was then advanced up the left ureter into the renal pelvis under fluoroscopic guidance.  The wire was then backloaded through the cystoscope and a ureteral stent was advance over the wire using Seldinger technique.  The stent was positioned appropriately under fluoroscopic and cystoscopic guidance.  The wire was then removed with an adequate stent curl noted in the renal pelvis as well as in the bladder.  The right ureteral stent was then exchanged in a similar manner.  A right retrograde pyelogram was also performed.  Findings noted above.  The bladder was then emptied and the procedure ended.  The patient appeared to tolerate the procedure well and without complications.  The patient was able to be awakened and transferred to the recovery unit in satisfactory condition.

## 2021-01-21 NOTE — Transfer of Care (Signed)
Immediate Anesthesia Transfer of Care Note  Patient: Mary Stark  Procedure(s) Performed: CYSTOSCOPY WITH STENT REPLACEMENT (Bilateral ) TRANSURETHRAL RESECTION OF BLADDER TUMOR (TURBT)  Patient Location: PACU  Anesthesia Type:General  Level of Consciousness: drowsy and patient cooperative  Airway & Oxygen Therapy: Patient Spontanous Breathing and Patient connected to nasal cannula oxygen  Post-op Assessment: Report given to RN and Post -op Vital signs reviewed and stable  Post vital signs: Reviewed and stable  Last Vitals:  Vitals Value Taken Time  BP 109/67 01/21/21 1015  Temp    Pulse 57 01/21/21 1016  Resp 10 01/21/21 1016  SpO2 98 % 01/21/21 1016  Vitals shown include unvalidated device data.  Last Pain:  Vitals:   01/21/21 0905  TempSrc: Oral  PainSc: 0-No pain      Patients Stated Pain Goal: 3 (08/16/00 4996)  Complications: No complications documented.

## 2021-01-21 NOTE — Discharge Instructions (Signed)
Post stent placement instructions   Definitions:  Ureter: The duct that transports urine from the kidney to the bladder. Stent: A plastic hollow tube that is placed into the ureter, from the kidney to the bladder to prevent the ureter from swelling shut.  General instructions:  Despite the fact that no skin incisions were used, the area around the ureter and bladder is raw and irritated. The stent is a foreign body which can further irritate the bladder wall. This irritation is manifested by increased frequency of urination, both day and night, and by an increase in the urge to urinate. In some, the urge to urinate is present almost always. Sometimes the urge is strong enough that you may not be able to stop your self from urinating. This can often be controlled with medication but does not occur in everyone. A stent can safely be left in place for 3 months or greater.  You may see some blood in your urine while the stent is in place and a few days afterward. Do not be alarmed, even if the urine is clear for a while. Get off your feet and drink lots of fluids until clearing occurs. If you start to pass clots or don't improve, call us.  Diet:  You may return to your normal diet immediately. Because of the raw surface of your bladder, alcohol, spicy foods, foods high in acid and drinks with caffeine may cause irritation or frequency and should be used in moderation. To keep your urine flowing freely and avoid constipation, drink plenty of fluids during the day (8-10 glasses). Tip: Avoid cranberry juice because it is very acidic.  Activity:  Your physical activity doesn't need to be restricted. However, if you are very active, you may see some blood in the urine. We suggest that you reduce your activity under the circumstances until the bleeding has stopped.  Bowels:  It is important to keep your bowels regular during the postoperative period. Straining with bowel movements can cause bleeding. A  bowel movement every other day is reasonable. Use a mild laxative if needed, such as milk of magnesia 2-3 tablespoons, or 2 Dulcolax tablets. Call if you continue to have problems. If you had been taking narcotics for pain, before, during or after your surgery, you may be constipated. Take a laxative if necessary.  Medication:  You should resume your pre-surgery medications unless told not to. In addition you may be given an antibiotic to prevent or treat infection. Antibiotics are not always necessary. All medication should be taken as prescribed until the bottles are finished unless you are having an unusual reaction to one of the drugs.  Problems you should report to [place]:  a. Fever greater than 101F. b. Heavy bleeding, or clots (see notes above about blood in urine). c. Inability to urinate. d. Drug reactions (hives, rash, nausea, vomiting, diarrhea). e. Severe burning or pain with urination that is not improving.    Ureteral Stent Implantation, Care After This sheet gives you information about how to care for yourself after your procedure. Your health care provider may also give you more specific instructions. If you have problems or questions, contact your health care provider. What can I expect after the procedure? After the procedure, it is common to have:  Nausea.  Mild pain when you urinate. You may feel this pain in your lower back or lower abdomen. The pain should stop within a few minutes after you urinate. This may last for up to 1 week.  A small amount of blood in your urine for several days. Follow these instructions at home: Medicines  Take over-the-counter and prescription medicines only as told by your health care provider.  If you were prescribed an antibiotic medicine, take it as told by your health care provider. Do not stop taking the antibiotic even if you start to feel better.  Do not drive for 24 hours if you were given a sedative during your  procedure.  Ask your health care provider if the medicine prescribed to you requires you to avoid driving or using heavy machinery. Activity  Rest as told by your health care provider.  Avoid sitting for a long time without moving. Get up to take short walks every 1-2 hours. This is important to improve blood flow and breathing. Ask for help if you feel weak or unsteady.  Return to your normal activities as told by your health care provider. Ask your health care provider what activities are safe for you. General instructions  Watch for any blood in your urine. Call your health care provider if the amount of blood in your urine increases.  If you have a catheter: ? Follow instructions from your health care provider about taking care of your catheter and collection bag. ? Do not take baths, swim, or use a hot tub until your health care provider approves. Ask your health care provider if you may take showers. You may only be allowed to take sponge baths.  Drink enough fluid to keep your urine pale yellow.  Do not use any products that contain nicotine or tobacco, such as cigarettes, e-cigarettes, and chewing tobacco. These can delay healing after surgery. If you need help quitting, ask your health care provider.  Keep all follow-up visits as told by your health care provider. This is important.   Contact a health care provider if:  You have pain that gets worse or does not get better with medicine, especially pain when you urinate.  You have difficulty urinating.  You feel nauseous or you vomit repeatedly during a period of more than 2 days after the procedure. Get help right away if:  Your urine is dark red or has blood clots in it.  You are leaking urine (have incontinence).  The end of the stent comes out of your urethra.  You cannot urinate.  You have sudden, sharp, or severe pain in your abdomen or lower back.  You have a fever.  You have swelling or pain in your  legs.  You have difficulty breathing. Summary  After the procedure, it is common to have mild pain when you urinate that goes away within a few minutes after you urinate. This may last for up to 1 week.  Watch for any blood in your urine. Call your health care provider if the amount of blood in your urine increases.  Take over-the-counter and prescription medicines only as told by your health care provider.  Drink enough fluid to keep your urine pale yellow. This information is not intended to replace advice given to you by your health care provider. Make sure you discuss any questions you have with your health care provider. Document Revised: 09/06/2018 Document Reviewed: 09/07/2018 Elsevier Patient Education  2021 [place] Instructions  Activity: Get plenty of rest for the remainder of the day. A responsible individual must stay with you for 24 hours following the procedure.  For the next 24 hours, DO NOT: -Drive  a car -Paediatric nurse -Drink alcoholic beverages -Take any medication unless instructed by your physician -Make any legal decisions or sign important papers.  Meals: Start with liquid foods such as gelatin or soup. Progress to regular foods as tolerated. Avoid greasy, spicy, heavy foods. If nausea and/or vomiting occur, drink only clear liquids until the nausea and/or vomiting subsides. Call your physician if vomiting continues.  Special Instructions/Symptoms: Your throat may feel dry or sore from the anesthesia or the breathing tube placed in your throat during surgery. If this causes discomfort, gargle with warm salt water. The discomfort should disappear within 24 hours.

## 2021-01-22 ENCOUNTER — Encounter (HOSPITAL_BASED_OUTPATIENT_CLINIC_OR_DEPARTMENT_OTHER): Payer: Self-pay | Admitting: Urology

## 2021-01-23 ENCOUNTER — Inpatient Hospital Stay (HOSPITAL_BASED_OUTPATIENT_CLINIC_OR_DEPARTMENT_OTHER): Payer: No Typology Code available for payment source | Admitting: Oncology

## 2021-01-23 ENCOUNTER — Inpatient Hospital Stay: Payer: No Typology Code available for payment source | Attending: Oncology

## 2021-01-23 ENCOUNTER — Inpatient Hospital Stay: Payer: No Typology Code available for payment source

## 2021-01-23 ENCOUNTER — Other Ambulatory Visit: Payer: Self-pay

## 2021-01-23 VITALS — BP 141/85 | HR 66 | Temp 97.4°F | Resp 18 | Wt 188.7 lb

## 2021-01-23 DIAGNOSIS — D630 Anemia in neoplastic disease: Secondary | ICD-10-CM | POA: Insufficient documentation

## 2021-01-23 DIAGNOSIS — Z5111 Encounter for antineoplastic chemotherapy: Secondary | ICD-10-CM | POA: Insufficient documentation

## 2021-01-23 DIAGNOSIS — C801 Malignant (primary) neoplasm, unspecified: Secondary | ICD-10-CM

## 2021-01-23 DIAGNOSIS — C669 Malignant neoplasm of unspecified ureter: Secondary | ICD-10-CM

## 2021-01-23 DIAGNOSIS — R590 Localized enlarged lymph nodes: Secondary | ICD-10-CM | POA: Diagnosis not present

## 2021-01-23 LAB — CMP (CANCER CENTER ONLY)
ALT: 11 U/L (ref 0–44)
AST: 19 U/L (ref 15–41)
Albumin: 3.8 g/dL (ref 3.5–5.0)
Alkaline Phosphatase: 71 U/L (ref 38–126)
Anion gap: 9 (ref 5–15)
BUN: 23 mg/dL (ref 8–23)
CO2: 24 mmol/L (ref 22–32)
Calcium: 8.9 mg/dL (ref 8.9–10.3)
Chloride: 106 mmol/L (ref 98–111)
Creatinine: 1.38 mg/dL — ABNORMAL HIGH (ref 0.44–1.00)
GFR, Estimated: 43 mL/min — ABNORMAL LOW (ref >60)
Glucose, Bld: 94 mg/dL (ref 70–99)
Potassium: 4.3 mmol/L (ref 3.5–5.1)
Sodium: 139 mmol/L (ref 135–145)
Total Bilirubin: <0.2 mg/dL — ABNORMAL LOW (ref 0.3–1.2)
Total Protein: 7.8 g/dL (ref 6.5–8.1)

## 2021-01-23 LAB — SAMPLE TO BLOOD BANK

## 2021-01-23 LAB — CBC WITH DIFFERENTIAL (CANCER CENTER ONLY)
Abs Immature Granulocytes: 0.08 10*3/uL — ABNORMAL HIGH (ref 0.00–0.07)
Basophils Absolute: 0.1 10*3/uL (ref 0.0–0.1)
Basophils Relative: 1 %
Eosinophils Absolute: 0 10*3/uL (ref 0.0–0.5)
Eosinophils Relative: 1 %
HCT: 28.1 % — ABNORMAL LOW (ref 36.0–46.0)
Hemoglobin: 8.7 g/dL — ABNORMAL LOW (ref 12.0–15.0)
Immature Granulocytes: 1 %
Lymphocytes Relative: 31 %
Lymphs Abs: 2 10*3/uL (ref 0.7–4.0)
MCH: 30.6 pg (ref 26.0–34.0)
MCHC: 31 g/dL (ref 30.0–36.0)
MCV: 98.9 fL (ref 80.0–100.0)
Monocytes Absolute: 1.3 10*3/uL — ABNORMAL HIGH (ref 0.1–1.0)
Monocytes Relative: 21 %
Neutro Abs: 3 10*3/uL (ref 1.7–7.7)
Neutrophils Relative %: 45 %
Platelet Count: 206 10*3/uL (ref 150–400)
RBC: 2.84 MIL/uL — ABNORMAL LOW (ref 3.87–5.11)
RDW: 22.2 % — ABNORMAL HIGH (ref 11.5–15.5)
WBC Count: 6.5 10*3/uL (ref 4.0–10.5)
nRBC: 0.3 % — ABNORMAL HIGH (ref 0.0–0.2)

## 2021-01-23 LAB — SURGICAL PATHOLOGY

## 2021-01-23 MED ORDER — SODIUM CHLORIDE 0.9 % IV SOLN
150.0000 mg | Freq: Once | INTRAVENOUS | Status: AC
  Administered 2021-01-23: 150 mg via INTRAVENOUS
  Filled 2021-01-23: qty 150

## 2021-01-23 MED ORDER — SODIUM CHLORIDE 0.9 % IV SOLN
1000.0000 mg/m2 | Freq: Once | INTRAVENOUS | Status: AC
  Administered 2021-01-23: 2014 mg via INTRAVENOUS
  Filled 2021-01-23: qty 52.97

## 2021-01-23 MED ORDER — SODIUM CHLORIDE 0.9 % IV SOLN
10.0000 mg | Freq: Once | INTRAVENOUS | Status: AC
  Administered 2021-01-23: 10 mg via INTRAVENOUS
  Filled 2021-01-23: qty 10

## 2021-01-23 MED ORDER — SODIUM CHLORIDE 0.9 % IV SOLN
350.0000 mg | Freq: Once | INTRAVENOUS | Status: AC
  Administered 2021-01-23: 350 mg via INTRAVENOUS
  Filled 2021-01-23: qty 35

## 2021-01-23 MED ORDER — HEPARIN SOD (PORK) LOCK FLUSH 100 UNIT/ML IV SOLN
500.0000 [IU] | Freq: Once | INTRAVENOUS | Status: AC | PRN
  Administered 2021-01-23: 500 [IU]
  Filled 2021-01-23: qty 5

## 2021-01-23 MED ORDER — PALONOSETRON HCL INJECTION 0.25 MG/5ML
INTRAVENOUS | Status: AC
  Filled 2021-01-23: qty 5

## 2021-01-23 MED ORDER — PALONOSETRON HCL INJECTION 0.25 MG/5ML
0.2500 mg | Freq: Once | INTRAVENOUS | Status: AC
Start: 2021-01-23 — End: 2021-01-23
  Administered 2021-01-23: 0.25 mg via INTRAVENOUS

## 2021-01-23 MED ORDER — SODIUM CHLORIDE 0.9 % IV SOLN
Freq: Once | INTRAVENOUS | Status: AC
  Filled 2021-01-23: qty 250

## 2021-01-23 MED ORDER — SODIUM CHLORIDE 0.9% FLUSH
10.0000 mL | INTRAVENOUS | Status: DC | PRN
  Administered 2021-01-23: 10 mL
  Filled 2021-01-23: qty 10

## 2021-01-23 NOTE — Patient Instructions (Signed)
[facility] Discharge Instructions for Patients Receiving Chemotherapy  Today you received the following chemotherapy agents: gemcitabine/carboplatin.  To help prevent nausea and vomiting after your treatment, we encourage you to take your nausea medication as directed.   If you develop nausea and vomiting that is not controlled by your nausea medication, call the clinic.   BELOW ARE SYMPTOMS THAT SHOULD BE REPORTED IMMEDIATELY:  *FEVER GREATER THAN 100.5 F  *CHILLS WITH OR WITHOUT FEVER  NAUSEA AND VOMITING THAT IS NOT CONTROLLED WITH YOUR NAUSEA MEDICATION  *UNUSUAL SHORTNESS OF BREATH  *UNUSUAL BRUISING OR BLEEDING  TENDERNESS IN MOUTH AND THROAT WITH OR WITHOUT PRESENCE OF ULCERS  *URINARY PROBLEMS  *BOWEL PROBLEMS  UNUSUAL RASH Items with * indicate a potential emergency and should be followed up as soon as possible.  Feel free to call the clinic should you have any questions or concerns. The clinic phone number is [phone].  Please show the [facility] at check-in to the Emergency Department and triage nurse.

## 2021-01-23 NOTE — Progress Notes (Signed)
Hematology and Oncology Follow Up Visit  Mary Stark [id number] [birth year] 65 y.o. 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  She was found to have retroperitoneal adenopathy.   Prior Therapy:  She is status post retroperitoneal lymph node biopsy completed on 10/24/2020 which showed metastatic carcinoma.  Current therapy: Chemotherapy utilizing carboplatin and gemcitabine started on November 22, 2020.  She is here for day 1 of cycle 4.   Interim History: Mary Stark presents today for a repeat follow-up.  Since the last visit, she has reported feeling well up until she had surgery on February 8.  She underwent cystoscopy and bilateral ureteral stent exchange and has complained of pain on the right side since that time.  She denies any nausea, vomiting or abdominal pain.  She denies any peripheral neuropathy or excessive fatigue.  Performance status quality of life remains maintained.  He denies any specific complications related to chemotherapy.     Medications: Unchanged on review. Current Outpatient Medications  Medication Sig Dispense Refill  . acetaminophen (TYLENOL) 500 MG tablet Take 1,000 mg by mouth every 8 (eight) hours as needed for moderate pain.     Marland Kitchen albuterol (PROAIR HFA) 108 (90 Base) MCG/ACT inhaler INHALE 1 TO 2 PUFFS BY MOUTH INTO THE LUNGS EVERY 4 HOURS AS NEEDED FOR WHEEZING OR SHORTNESS OF BREATH (Patient taking differently: Inhale 1-2 puffs into the lungs every 4 (four) hours as needed for wheezing or shortness of breath. INHALE 1 TO 2 PUFFS BY MOUTH INTO THE LUNGS EVERY 4 HOURS AS NEEDED FOR WHEEZING OR SHORTNESS OF BREATH) 8.5 g 1  . amLODipine (NORVASC) 5 MG tablet Take 1 tablet (5 mg total) by mouth daily. appt for refills 90 tablet 0  . ARIPiprazole 10 MG TABS Take 10 mg by mouth daily. (Patient taking  differently: Take 10 mg by mouth at bedtime. abilify) 90 tablet 1  . ascorbic acid (VITAMIN C) 500 MG tablet Take 1,000 mg by mouth every evening.     . ASPIRIN LOW DOSE 81 MG EC tablet TAKE 1 TABLET BY MOUTH DAILY. (Patient taking differently: Take 81 mg by mouth daily.) 90 tablet 3  . buPROPion (WELLBUTRIN XL) 300 MG 24 hr tablet Take 1 tablet (300 mg total) by mouth daily. 90 tablet 1  . BYSTOLIC 20 MG TABS TAKE 1 TABLET (20 MG TOTAL) BY MOUTH DAILY. (Patient taking differently: Take 20 mg by mouth daily.) 90 tablet 1  . Cholecalciferol (VITAMIN D) 125 MCG (5000 UT) CAPS Take 5,000 Units by mouth every evening.     . clonazePAM (KLONOPIN) 0.5 MG tablet Take 1 tablet (0.5 mg total) by mouth at bedtime. (Patient taking differently: Take 0.5 mg by mouth at bedtime as needed.) 30 tablet 2  . Coenzyme Q10 (CO Q-10) 200 MG CAPS Take 200 mg by mouth every evening.     . fexofenadine (ALLEGRA) 180 MG tablet Take 180 mg by mouth every evening.     . furosemide (LASIX) 20 MG tablet Take one tab daily by mouth. 90 tablet 3  . gabapentin (NEURONTIN) 300 MG capsule 1  qam   4   qhs (Patient taking differently: Take 300-1,200 mg by mouth See admin instructions. Take 300 mg by mouth in the morning and afternoon and 1200 mg at night) 150 capsule 5  . GLUCOSAMINE-CHONDROITIN PO Take 2 tablets by mouth every evening.     Marland Kitchen  guaiFENesin-dextromethorphan (ROBITUSSIN DM) 100-10 MG/5ML syrup Take 10 mLs by mouth every 4 (four) hours as needed for cough. 473 mL 0  . lidocaine-prilocaine (EMLA) cream Apply 1 application topically as needed. 30 g 0  . MYRBETRIQ 25 MG TB24 tablet Take 25 mg by mouth daily.    . nitrofurantoin (MACRODANTIN) 100 MG capsule Take 1 capsule (100 mg total) by mouth at bedtime. 90 capsule 1  . Omega-3 Fatty Acids (FISH OIL) 1200 MG CAPS Take 1,200 mg by mouth every evening.     Marland Kitchen omeprazole (PRILOSEC) 20 MG capsule Take 1 capsule (20 mg total) by mouth daily. appt for further refills 90 capsule 0   . ondansetron (ZOFRAN-ODT) 4 MG disintegrating tablet Take 1 tablet (4 mg total) by mouth every 8 (eight) hours as needed for nausea or vomiting. 12 tablet 0  . oxyCODONE-acetaminophen (PERCOCET) 10-325 MG tablet Take 1 tablet by mouth every 8 (eight) hours as needed for pain. 90 tablet 0  . simvastatin (ZOCOR) 20 MG tablet TAKE 1 TABLET (20 MG TOTAL) BY MOUTH DAILY. (Patient taking differently: Take 20 mg by mouth daily at 6 PM.) 90 tablet 1  . tamsulosin (FLOMAX) 0.4 MG CAPS capsule Take 0.4 mg by mouth at bedtime.    . thiamine 100 MG tablet Take 1 tablet (100 mg total) by mouth daily. 30 tablet 0  . vitamin B-12 (CYANOCOBALAMIN) 1000 MCG tablet Take 2,000 mcg by mouth every evening.     . Vitamin E 180 MG (400 UNIT) CAPS Take 400 Units by mouth every evening.      No current facility-administered medications for this visit.     Allergies:  Allergies  Allergen Reactions  . Metformin And Related Other (See Comments)    Lactic acidosis   . Penicillins Hives and Rash    Reports hives to penicillin and amoxicillin as an adult "a long time ago." Has tolerated cefdinir (09/2020) and cefepime (10/2020) before.      Physical Exam:   Blood pressure (!) 141/85, pulse 66, temperature (!) 97.4 F (36.3 C), temperature source Tympanic, resp. rate 18, weight 188 lb 11.2 oz (85.6 kg), SpO2 97 %.  ECOG: 1   General appearance: Comfortable appearing without any discomfort Head: Normocephalic without any trauma Oropharynx: Mucous membranes are moist and pink without any thrush or ulcers. Eyes: Pupils are equal and round reactive to light. Lymph nodes: No cervical, supraclavicular, inguinal or axillary lymphadenopathy.   Heart:regular rate and rhythm.  S1 and S2 without leg edema. Lung: Clear without any rhonchi or wheezes.  No dullness to percussion. Abdomin: Soft, nontender, nondistended with good bowel sounds.  No hepatosplenomegaly. Musculoskeletal: No joint deformity or effusion.  Full  range of motion noted. Neurological: No deficits noted on motor, sensory and deep tendon reflex exam. Skin: No petechial rash or dryness.  Appeared moist.        Lab Results: Lab Results  Component Value Date   WBC 4.1 01/09/2021   HGB 10.5 (L) 01/21/2021   HCT 31.0 (L) 01/21/2021   MCV 96.1 01/09/2021   PLT 479 (H) 01/09/2021     Chemistry      Component Value Date/Time   NA 143 01/21/2021 0907   NA 142 04/26/2018 1333   K 4.1 01/21/2021 0907   CL 104 01/21/2021 0907   CO2 22 01/09/2021 1354   BUN 24 (H) 01/21/2021 0907   BUN 22 04/26/2018 1333   CREATININE 1.20 (H) 01/21/2021 0907   CREATININE 1.17 (H) 01/09/2021 1354  CREATININE 0.83 09/22/2020 1330   GLU 87 02/07/2013 0000      Component Value Date/Time   CALCIUM 8.9 01/09/2021 1354   ALKPHOS 79 01/09/2021 1354   AST 32 01/09/2021 1354   ALT 22 01/09/2021 1354   BILITOT <0.2 (L) 01/09/2021 1354      EXAM: CT CHEST, ABDOMEN, AND PELVIS WITH CONTRAST  TECHNIQUE: Multidetector CT imaging of the chest, abdomen and pelvis was performed following the standard protocol during bolus administration of intravenous contrast.  CONTRAST:  193mL OMNIPAQUE IOHEXOL 300 MG/ML  SOLN  COMPARISON:  10/24/2010  FINDINGS: CT CHEST FINDINGS  Cardiovascular: Heart is normal in size.  No pericardial effusion.  No evidence of thoracic aortic aneurysm. Very mild atherosclerotic calcifications of the aortic arch.  Mild coronary atherosclerosis of the LAD.  Right chest port terminates in the lower SVC.  Mediastinum/Nodes: No suspicious mediastinal, hilar, or axillary lymphadenopathy.  Visualized thyroid is unremarkable.  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  No focal consolidation.  No suspicious pulmonary nodules.  Prior left pleural effusion has resolved.  No pneumothorax.  Musculoskeletal:  Visualized osseous structures are within normal limits.  CT ABDOMEN PELVIS FINDINGS  Hepatobiliary: Liver is within normal limits.  Gallbladder is unremarkable. No intrahepatic or extrahepatic ductal dilatation.  Pancreas: Within normal limits.  Spleen: Within normal limits. Splenule in the left upper abdomen (series 2/image 51).  Adrenals/Urinary Tract: Adrenal glands are within normal limits.  Right kidney is within normal limits. Indwelling right double-pigtail ureteral stent in satisfactory position. Abnormal soft tissue in the right renal collecting system (for example, series 2/image 73) which is suspicious for urothelial tumor. No hydronephrosis.  Left kidney is notable for mild scarring along the posterior left upper pole, likely postprocedural. Moderate left hydronephrosis with indwelling double pigtail ureteral stent in satisfactory position.  Bladder is notable for very mild polypoid soft tissue thickening along the left posterior bladder neck (series 2/image 117-118), near the left ureteral orifice, equivocal but worrisome for bladder neoplasm.  Stomach/Bowel: Stomach is within normal limits.  No evidence of bowel obstruction.  Appendix is not discretely visualized.  Vascular/Lymphatic: No evidence of abdominal aortic aneurysm.  Retroperitoneal/para-aortic lymphadenopathy, reflecting nodal metastases. The overall bulk has decreased, but some of the dominant nodes have increased. For example:  --10 mm left para-aortic node adjacent to the left adrenal gland (series 2/image 63), previously 16 mm  --11 mm right retrocaval node adjacent to the right kidney (series 2/image 69), previously 14 mm  --Dominant 2.4 cm left para-aortic node (series 2/image 70), previously 17 mm  --Dominant 2.2 cm right para-aortic node (series 2/image 80), previously 18 mm  --13 mm short axis left common iliac node (series 2/image 85), previously 10  mm  9 mm short axis left deep inguinal node (series 2/image 114), previously 11 mm.  Reproductive: Uterus and bilateral ovaries are unremarkable.  Other: No abdominopelvic ascites.  Musculoskeletal: Mild degenerative changes of the lumbar spine.  IMPRESSION: Again noted is abnormal soft tissue in the right renal collecting system, suspicious for urothelial tumor. Indwelling double pigtail ureteral stent. No hydronephrosis.  Additional mild polypoid soft tissue thickening along the left posterior bladder neck, equivocal but worrisome for bladder neoplasm.  Moderate left hydronephrosis with indwelling double pigtail ureteral stent in satisfactory position.  Retroperitoneal/para-aortic nodal metastases, with overall mixed response, as above.  No evidence of metastatic disease in the chest. Prior left pleural effusion has resolved.   Impression and  Plan:    65 year old woman with:  1.    Stage IV urothelial carcinoma arising from the GU tract presented with lymphadenopathy in November 2021.    She has completed 3 cycles of chemotherapy without any major complications.  CT scan obtained on January 16, 2021 was reviewed and showed overall mixed response but generally reasonable reduction in some of her lymph nodes.  No evidence of new lesions detected at this time.  Treatment options were discussed at this time including continuing this current regimen versus switching to different salvage therapy.  After discussion today, we opted to complete the 6 cycles of therapy before potentially switching to immunotherapy at that time.  This can happen sooner if she develops more complications related to chemotherapy.  2. IV access:Port-A-Cath remains in use without any issues.  3. Antiemetics: Manageable with Compazine at this time.  No nausea or vomiting.  4. Renal function surveillance:Creatinine clearance remains around 50 cc/min we will continue to monitor on  platinum therapy.  5. Goals of care:  Her disease is incurable and treatment is palliative at this time.   6.  Anemia: Hemoglobin is stable at this time we will continue to monitor.  No transfusion is needed.  7. Follow-up: In 1 week to complete the current cycle and in 3 weeks for the start of the next cycle of therapy.  30  minutes were spent on this encounter.  Time was dedicated to reviewing disease status, reviewing imaging studies and treatment options for the future.   Zola Button, MD 2/10/202211:07 AM

## 2021-01-28 MED FILL — GABAPENTIN 300 MG CAPSULE: 300 | 30 days supply | Qty: 180 | Fill #1

## 2021-01-30 ENCOUNTER — Other Ambulatory Visit: Payer: Self-pay

## 2021-01-30 ENCOUNTER — Inpatient Hospital Stay: Payer: No Typology Code available for payment source

## 2021-01-30 ENCOUNTER — Other Ambulatory Visit: Payer: Self-pay | Admitting: Hematology

## 2021-01-30 VITALS — BP 156/88 | HR 61 | Temp 97.9°F | Resp 20 | Wt 187.2 lb

## 2021-01-30 DIAGNOSIS — Z5111 Encounter for antineoplastic chemotherapy: Secondary | ICD-10-CM | POA: Diagnosis not present

## 2021-01-30 DIAGNOSIS — C801 Malignant (primary) neoplasm, unspecified: Secondary | ICD-10-CM

## 2021-01-30 DIAGNOSIS — Z95828 Presence of other vascular implants and grafts: Secondary | ICD-10-CM

## 2021-01-30 DIAGNOSIS — C669 Malignant neoplasm of unspecified ureter: Secondary | ICD-10-CM

## 2021-01-30 LAB — CBC WITH DIFFERENTIAL (CANCER CENTER ONLY)
Abs Immature Granulocytes: 0.09 10*3/uL — ABNORMAL HIGH (ref 0.00–0.07)
Basophils Absolute: 0 10*3/uL (ref 0.0–0.1)
Basophils Relative: 1 %
Eosinophils Absolute: 0 10*3/uL (ref 0.0–0.5)
Eosinophils Relative: 0 %
HCT: 25.7 % — ABNORMAL LOW (ref 36.0–46.0)
Hemoglobin: 8 g/dL — ABNORMAL LOW (ref 12.0–15.0)
Immature Granulocytes: 2 %
Lymphocytes Relative: 39 %
Lymphs Abs: 1.4 10*3/uL (ref 0.7–4.0)
MCH: 31.4 pg (ref 26.0–34.0)
MCHC: 31.1 g/dL (ref 30.0–36.0)
MCV: 100.8 fL — ABNORMAL HIGH (ref 80.0–100.0)
Monocytes Absolute: 0.8 10*3/uL (ref 0.1–1.0)
Monocytes Relative: 21 %
Neutro Abs: 1.4 10*3/uL — ABNORMAL LOW (ref 1.7–7.7)
Neutrophils Relative %: 37 %
Platelet Count: 244 10*3/uL (ref 150–400)
RBC: 2.55 MIL/uL — ABNORMAL LOW (ref 3.87–5.11)
RDW: 19.9 % — ABNORMAL HIGH (ref 11.5–15.5)
WBC Count: 3.7 10*3/uL — ABNORMAL LOW (ref 4.0–10.5)
nRBC: 0.5 % — ABNORMAL HIGH (ref 0.0–0.2)

## 2021-01-30 LAB — CMP (CANCER CENTER ONLY)
ALT: 10 U/L (ref 0–44)
AST: 19 U/L (ref 15–41)
Albumin: 3.4 g/dL — ABNORMAL LOW (ref 3.5–5.0)
Alkaline Phosphatase: 80 U/L (ref 38–126)
Anion gap: 7 (ref 5–15)
BUN: 17 mg/dL (ref 8–23)
CO2: 24 mmol/L (ref 22–32)
Calcium: 8.8 mg/dL — ABNORMAL LOW (ref 8.9–10.3)
Chloride: 106 mmol/L (ref 98–111)
Creatinine: 1.06 mg/dL — ABNORMAL HIGH (ref 0.44–1.00)
GFR, Estimated: 59 mL/min — ABNORMAL LOW (ref >60)
Glucose, Bld: 113 mg/dL — ABNORMAL HIGH (ref 70–99)
Potassium: 4.1 mmol/L (ref 3.5–5.1)
Sodium: 137 mmol/L (ref 135–145)
Total Bilirubin: <0.2 mg/dL — ABNORMAL LOW (ref 0.3–1.2)
Total Protein: 7.7 g/dL (ref 6.5–8.1)

## 2021-01-30 LAB — SAMPLE TO BLOOD BANK

## 2021-01-30 MED ORDER — PALONOSETRON HCL INJECTION 0.25 MG/5ML
INTRAVENOUS | Status: AC
  Filled 2021-01-30: qty 5

## 2021-01-30 MED ORDER — SODIUM CHLORIDE 0.9 % IV SOLN
Freq: Once | INTRAVENOUS | Status: AC
  Filled 2021-01-30: qty 250

## 2021-01-30 MED ORDER — SODIUM CHLORIDE 0.9 % IV SOLN
1000.0000 mg/m2 | Freq: Once | INTRAVENOUS | Status: AC
  Administered 2021-01-30: 2014 mg via INTRAVENOUS
  Filled 2021-01-30: qty 52.97

## 2021-01-30 MED ORDER — SODIUM CHLORIDE 0.9% FLUSH
10.0000 mL | INTRAVENOUS | Status: DC | PRN
  Administered 2021-01-30: 10 mL
  Filled 2021-01-30: qty 10

## 2021-01-30 MED ORDER — SODIUM CHLORIDE 0.9% FLUSH
10.0000 mL | Freq: Once | INTRAVENOUS | Status: AC
  Administered 2021-01-30: 10 mL
  Filled 2021-01-30: qty 10

## 2021-01-30 MED ORDER — HEPARIN SOD (PORK) LOCK FLUSH 100 UNIT/ML IV SOLN
500.0000 [IU] | Freq: Once | INTRAVENOUS | Status: AC | PRN
  Administered 2021-01-30: 500 [IU]
  Filled 2021-01-30: qty 5

## 2021-01-30 MED ORDER — PALONOSETRON HCL INJECTION 0.25 MG/5ML
0.2500 mg | Freq: Once | INTRAVENOUS | Status: AC
  Administered 2021-01-30: 0.25 mg via INTRAVENOUS

## 2021-01-30 NOTE — Patient Instructions (Signed)

## 2021-01-30 NOTE — Patient Instructions (Signed)
[facility] Discharge Instructions for Patients Receiving Chemotherapy  Today you received the following chemotherapy agent: Gemcitabine (Gemzar)  To help prevent nausea and vomiting after your treatment, we encourage you to take your nausea medication as directed by your MD.   If you develop nausea and vomiting that is not controlled by your nausea medication, call the clinic.   BELOW ARE SYMPTOMS THAT SHOULD BE REPORTED IMMEDIATELY:  *FEVER GREATER THAN 100.5 F  *CHILLS WITH OR WITHOUT FEVER  NAUSEA AND VOMITING THAT IS NOT CONTROLLED WITH YOUR NAUSEA MEDICATION  *UNUSUAL SHORTNESS OF BREATH  *UNUSUAL BRUISING OR BLEEDING  TENDERNESS IN MOUTH AND THROAT WITH OR WITHOUT PRESENCE OF ULCERS  *URINARY PROBLEMS  *BOWEL PROBLEMS  UNUSUAL RASH Items with * indicate a potential emergency and should be followed up as soon as possible.  Feel free to call the clinic should you have any questions or concerns. The clinic phone number is [phone].  Please show the CHEMO ALERT CARD at check-in to the Emergency Department and triage nurse.   

## 2021-01-30 NOTE — Progress Notes (Signed)
Dr. Irene Limbo on call and per Dr. Irene Limbo, ok for treatment today with Hemoglobin 8.0 and ANC 1.4.

## 2021-02-04 ENCOUNTER — Ambulatory Visit: Payer: No Typology Code available for payment source | Admitting: Sports Medicine

## 2021-02-04 ENCOUNTER — Other Ambulatory Visit (HOSPITAL_COMMUNITY): Payer: Self-pay | Admitting: Urology

## 2021-02-04 MED FILL — OXYBUTYNIN CHLORIDE 5 MG TA: 5 | 30 days supply | Qty: 90 | Fill #0

## 2021-02-12 MED FILL — buPROPion HCL ER (XL) 300 M: 300 | 30 days supply | Qty: 30 | Fill #2

## 2021-02-14 ENCOUNTER — Telehealth: Payer: Self-pay

## 2021-02-14 ENCOUNTER — Inpatient Hospital Stay (HOSPITAL_BASED_OUTPATIENT_CLINIC_OR_DEPARTMENT_OTHER): Payer: No Typology Code available for payment source | Admitting: Oncology

## 2021-02-14 ENCOUNTER — Other Ambulatory Visit: Payer: Self-pay

## 2021-02-14 ENCOUNTER — Inpatient Hospital Stay: Payer: No Typology Code available for payment source | Attending: Oncology

## 2021-02-14 ENCOUNTER — Inpatient Hospital Stay: Payer: No Typology Code available for payment source

## 2021-02-14 VITALS — BP 158/80 | Temp 98.3°F

## 2021-02-14 VITALS — BP 178/90 | HR 73 | Resp 20 | Wt 190.6 lb

## 2021-02-14 DIAGNOSIS — C669 Malignant neoplasm of unspecified ureter: Secondary | ICD-10-CM

## 2021-02-14 DIAGNOSIS — C801 Malignant (primary) neoplasm, unspecified: Secondary | ICD-10-CM

## 2021-02-14 DIAGNOSIS — D630 Anemia in neoplastic disease: Secondary | ICD-10-CM | POA: Diagnosis not present

## 2021-02-14 DIAGNOSIS — Z5111 Encounter for antineoplastic chemotherapy: Secondary | ICD-10-CM | POA: Diagnosis not present

## 2021-02-14 DIAGNOSIS — R590 Localized enlarged lymph nodes: Secondary | ICD-10-CM | POA: Diagnosis not present

## 2021-02-14 LAB — CMP (CANCER CENTER ONLY)
ALT: 13 U/L (ref 10–47)
AST: 19 U/L (ref 11–38)
Albumin: 3.8 g/dL (ref 3.5–5.0)
Alkaline Phosphatase: 82 U/L (ref 38–126)
Anion gap: 10 (ref 5–15)
BUN: 14 mg/dL (ref 8–23)
CO2: 24 mmol/L (ref 22–32)
Calcium: 8.8 mg/dL — ABNORMAL LOW (ref 8.9–10.3)
Chloride: 106 mmol/L (ref 98–111)
Creatinine: 1.04 mg/dL (ref 0.60–1.20)
GFR, Estimated: >60 mL/min (ref >60)
Glucose, Bld: 100 mg/dL — ABNORMAL HIGH (ref 70–99)
Potassium: 4.6 mmol/L (ref 3.5–5.1)
Sodium: 140 mmol/L (ref 135–145)
Total Bilirubin: 0.2 mg/dL (ref 0.2–1.6)
Total Protein: 7.6 g/dL (ref 6.5–8.1)

## 2021-02-14 LAB — CBC WITH DIFFERENTIAL (CANCER CENTER ONLY)
Abs Immature Granulocytes: 0.06 10*3/uL (ref 0.00–0.07)
Basophils Absolute: 0.1 10*3/uL (ref 0.0–0.1)
Basophils Relative: 2 %
Eosinophils Absolute: 0.1 10*3/uL (ref 0.0–0.5)
Eosinophils Relative: 2 %
HCT: 27.8 % — ABNORMAL LOW (ref 36.0–46.0)
Hemoglobin: 8.7 g/dL — ABNORMAL LOW (ref 12.0–15.0)
Immature Granulocytes: 1 %
Lymphocytes Relative: 32 %
Lymphs Abs: 1.4 10*3/uL (ref 0.7–4.0)
MCH: 33.1 pg (ref 26.0–34.0)
MCHC: 31.3 g/dL (ref 30.0–36.0)
MCV: 105.7 fL — ABNORMAL HIGH (ref 80.0–100.0)
Monocytes Absolute: 1 10*3/uL (ref 0.1–1.0)
Monocytes Relative: 24 %
Neutro Abs: 1.7 10*3/uL (ref 1.7–7.7)
Neutrophils Relative %: 39 %
Platelet Count: 191 10*3/uL (ref 150–400)
RBC: 2.63 MIL/uL — ABNORMAL LOW (ref 3.87–5.11)
RDW: 22.7 % — ABNORMAL HIGH (ref 11.5–15.5)
WBC Count: 4.3 10*3/uL (ref 4.0–10.5)
nRBC: 0.7 % — ABNORMAL HIGH (ref 0.0–0.2)

## 2021-02-14 LAB — SAMPLE TO BLOOD BANK

## 2021-02-14 MED ORDER — SODIUM CHLORIDE 0.9 % IV SOLN
10.0000 mg | Freq: Once | INTRAVENOUS | Status: AC
  Administered 2021-02-14: 10 mg via INTRAVENOUS
  Filled 2021-02-14: qty 10

## 2021-02-14 MED ORDER — SODIUM CHLORIDE 0.9% FLUSH
10.0000 mL | INTRAVENOUS | Status: DC | PRN
  Administered 2021-02-14: 10 mL
  Filled 2021-02-14: qty 10

## 2021-02-14 MED ORDER — SODIUM CHLORIDE 0.9 % IV SOLN
150.0000 mg | Freq: Once | INTRAVENOUS | Status: AC
  Administered 2021-02-14: 150 mg via INTRAVENOUS
  Filled 2021-02-14: qty 150

## 2021-02-14 MED ORDER — SODIUM CHLORIDE 0.9 % IV SOLN
1000.0000 mg/m2 | Freq: Once | INTRAVENOUS | Status: AC
  Administered 2021-02-14: 2014 mg via INTRAVENOUS
  Filled 2021-02-14: qty 52.97

## 2021-02-14 MED ORDER — PALONOSETRON HCL INJECTION 0.25 MG/5ML
0.2500 mg | Freq: Once | INTRAVENOUS | Status: AC
  Administered 2021-02-14: 0.25 mg via INTRAVENOUS

## 2021-02-14 MED ORDER — CARBOPLATIN CHEMO INJECTION 450 MG/45ML
350.0000 mg | Freq: Once | INTRAVENOUS | Status: AC
Start: 2021-02-14 — End: 2021-02-14
  Administered 2021-02-14: 350 mg via INTRAVENOUS
  Filled 2021-02-14: qty 35

## 2021-02-14 MED ORDER — SODIUM CHLORIDE 0.9 % IV SOLN
Freq: Once | INTRAVENOUS | Status: AC
  Filled 2021-02-14: qty 250

## 2021-02-14 MED ORDER — HEPARIN SOD (PORK) LOCK FLUSH 100 UNIT/ML IV SOLN
500.0000 [IU] | Freq: Once | INTRAVENOUS | Status: AC | PRN
  Administered 2021-02-14: 500 [IU]
  Filled 2021-02-14: qty 5

## 2021-02-14 MED ORDER — PALONOSETRON HCL INJECTION 0.25 MG/5ML
INTRAVENOUS | Status: AC
  Filled 2021-02-14: qty 5

## 2021-02-14 NOTE — Telephone Encounter (Signed)
-----  Message from Firas N Shadad, MD sent at 02/14/2021  9:41 AM EST ----- Regarding: Path studies. Please contact path for this patient and request the following on case [id number] completed on February 8.  She has bladder cancer with ICD code C67.9 and stage IV:   1.  Please obtain a PD-L1 testing.   2.  Foundation one testing.    Thanks  FS  

## 2021-02-14 NOTE — Progress Notes (Signed)
Hematology and Oncology Follow Up Visit  Mary Stark [id number] [birth year] 65 y.o. 02/14/2021 9:04 AM Mary Stark, DOAlexander, [place], DO   [name]: 65 year old woman with advanced malignancy with retroperitoneal adenopathy diagnosed in November 2021.  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   Prior Therapy:  She is status post retroperitoneal lymph node biopsy completed on 10/24/2020 which showed metastatic carcinoma.  She is status post repeat cystoscopy and bilateral ureteral stent exchange as well as resection of a bladder tumor on January 21, 2021.  A final pathology showed an adenocarcinoma.  Current therapy: Chemotherapy utilizing carboplatin and gemcitabine started on November 22, 2020.  She is here for day 1 of cycle 5.   Interim History: [name] is here for return evaluation.  Since the last visit, the last visit, she reports no major changes in her health.  She continues to feel reasonably well without any complaints.  She denies any nausea, vomiting or abdominal pain.  She denies any recent hospitalizations or illnesses.  Performance status quality of life remained reasonable but does report some fatigue associated with chemotherapy.     Medications: Updated on review. Current Outpatient Medications  Medication Sig Dispense Refill  . acetaminophen (TYLENOL) 500 MG tablet Take 1,000 mg by mouth every 8 (eight) hours as needed for moderate pain.     Marland Kitchen albuterol (PROAIR HFA) 108 (90 Base) MCG/ACT inhaler INHALE 1 TO 2 PUFFS BY MOUTH INTO THE LUNGS EVERY 4 HOURS AS NEEDED FOR WHEEZING OR SHORTNESS OF BREATH (Patient taking differently: Inhale 1-2 puffs into the lungs every 4 (four) hours as needed for wheezing or shortness of breath. INHALE 1 TO 2 PUFFS BY MOUTH INTO THE LUNGS EVERY 4 HOURS AS NEEDED FOR WHEEZING OR SHORTNESS OF BREATH) 8.5 g 1  . amLODipine (NORVASC) 5 MG tablet Take 1 tablet (5 mg total) by mouth  daily. appt for refills 90 tablet 0  . ARIPiprazole 10 MG TABS Take 10 mg by mouth daily. (Patient taking differently: Take 10 mg by mouth at bedtime. abilify) 90 tablet 1  . ascorbic acid (VITAMIN C) 500 MG tablet Take 1,000 mg by mouth every evening.     . ASPIRIN LOW DOSE 81 MG EC tablet TAKE 1 TABLET BY MOUTH DAILY. (Patient taking differently: Take 81 mg by mouth daily.) 90 tablet 3  . buPROPion (WELLBUTRIN XL) 300 MG 24 hr tablet Take 1 tablet (300 mg total) by mouth daily. 90 tablet 1  . BYSTOLIC 20 MG TABS TAKE 1 TABLET (20 MG TOTAL) BY MOUTH DAILY. (Patient taking differently: Take 20 mg by mouth daily.) 90 tablet 1  . Cholecalciferol (VITAMIN D) 125 MCG (5000 UT) CAPS Take 5,000 Units by mouth every evening.     . clonazePAM (KLONOPIN) 0.5 MG tablet Take 1 tablet (0.5 mg total) by mouth at bedtime. (Patient taking differently: Take 0.5 mg by mouth at bedtime as needed.) 30 tablet 2  . Coenzyme Q10 (CO Q-10) 200 MG CAPS Take 200 mg by mouth every evening.     . fexofenadine (ALLEGRA) 180 MG tablet Take 180 mg by mouth every evening.     . furosemide (LASIX) 20 MG tablet Take one tab daily by mouth. 90 tablet 3  . gabapentin (NEURONTIN) 300 MG capsule 1  qam   4   qhs (Patient taking differently: Take 300-1,200 mg by mouth See admin instructions. Take 300 mg by mouth in the morning and afternoon and 1200  mg at night) 150 capsule 5  . GLUCOSAMINE-CHONDROITIN PO Take 2 tablets by mouth every evening.     Marland Kitchen guaiFENesin-dextromethorphan (ROBITUSSIN DM) 100-10 MG/5ML syrup Take 10 mLs by mouth every 4 (four) hours as needed for cough. 473 mL 0  . lidocaine-prilocaine (EMLA) cream Apply 1 application topically as needed. 30 g 0  . MYRBETRIQ 25 MG TB24 tablet Take 25 mg by mouth daily.    . nitrofurantoin (MACRODANTIN) 100 MG capsule Take 1 capsule (100 mg total) by mouth at bedtime. 90 capsule 1  . Omega-3 Fatty Acids (FISH OIL) 1200 MG CAPS Take 1,200 mg by mouth every evening.     Marland Kitchen omeprazole  (PRILOSEC) 20 MG capsule Take 1 capsule (20 mg total) by mouth daily. appt for further refills 90 capsule 0  . ondansetron (ZOFRAN-ODT) 4 MG disintegrating tablet Take 1 tablet (4 mg total) by mouth every 8 (eight) hours as needed for nausea or vomiting. 12 tablet 0  . oxyCODONE-acetaminophen (PERCOCET) 10-325 MG tablet Take 1 tablet by mouth every 8 (eight) hours as needed for pain. 90 tablet 0  . simvastatin (ZOCOR) 20 MG tablet TAKE 1 TABLET (20 MG TOTAL) BY MOUTH DAILY. (Patient taking differently: Take 20 mg by mouth daily at 6 PM.) 90 tablet 1  . tamsulosin (FLOMAX) 0.4 MG CAPS capsule Take 0.4 mg by mouth at bedtime.    . thiamine 100 MG tablet Take 1 tablet (100 mg total) by mouth daily. 30 tablet 0  . vitamin B-12 (CYANOCOBALAMIN) 1000 MCG tablet Take 2,000 mcg by mouth every evening.     . Vitamin E 180 MG (400 UNIT) CAPS Take 400 Units by mouth every evening.      No current facility-administered medications for this visit.     Allergies:  Allergies  Allergen Reactions  . Metformin And Related Other (See Comments)    Lactic acidosis   . Penicillins Hives and Rash    Reports hives to penicillin and amoxicillin as an adult "a long time ago." Has tolerated cefdinir (09/2020) and cefepime (10/2020) before.      Physical Exam:  Blood pressure (!) 178/90, pulse 73, resp. rate 20, weight 190 lb 9.6 oz (86.5 kg), SpO2 100 %.    ECOG: 1    General appearance: Alert, awake without any distress. Head: Atraumatic without abnormalities Oropharynx: Without any thrush or ulcers. Eyes: No scleral icterus. Lymph nodes: No lymphadenopathy noted in the cervical, supraclavicular, or axillary nodes Heart:regular rate and rhythm, without any murmurs or gallops.   Lung: Clear to auscultation without any rhonchi, wheezes or dullness to percussion. Abdomin: Soft, nontender without any shifting dullness or ascites. Musculoskeletal: No clubbing or cyanosis. Neurological: No motor or  sensory deficits. Skin: No rashes or lesions.         Lab Results: Lab Results  Component Value Date   WBC 3.7 (L) 01/30/2021   HGB 8.0 (L) 01/30/2021   HCT 25.7 (L) 01/30/2021   MCV 100.8 (H) 01/30/2021   PLT 244 01/30/2021     Chemistry      Component Value Date/Time   NA 137 01/30/2021 1415   NA 142 04/26/2018 1333   K 4.1 01/30/2021 1415   CL 106 01/30/2021 1415   CO2 24 01/30/2021 1415   BUN 17 01/30/2021 1415   BUN 22 04/26/2018 1333   CREATININE 1.06 (H) 01/30/2021 1415   CREATININE 0.83 09/22/2020 1330   GLU 87 02/07/2013 0000      Component Value Date/Time  CALCIUM 8.8 (L) 01/30/2021 1415   ALKPHOS 80 01/30/2021 1415   AST 19 01/30/2021 1415   ALT 10 01/30/2021 1415   BILITOT <0.2 (L) 01/30/2021 1415       Impression and Plan:    65 year old woman with:  1.    Advanced GU malignancy diagnosed in November 2021.  She was found to have stage IV urothelial carcinoma with retroperitoneal adenopathy.  She is status post 4 cycles of chemotherapy with a mixed response on her imaging studies.  Risks and benefits of continuing this treatment were discussed at this time.  Cystoscopy obtained on February 8 did show possibly a different histology consistent with adenocarcinoma with signet ring features.   Alternative treatment options including switching to immunotherapy versus switching to a GI regimen such as FOLFOX.  Immunotherapy could be an option if her tumor is the appropriate features including PD-L1 expression or high TMB.  We will obtain next generation sequencing as well as PD-L1 status on this specimen.  For the time being she will complete the cycle 5 of chemotherapy as scheduled.   2. IV access:Port-A-Cath currently in use without any issues at this time.  3. Antiemetics: No nausea or vomiting reported at this time.  Compazine is available to her.  4. Renal function surveillance:No further decline of her kidney function at this  time.  5. Goals of care:Therapy remains palliative low dose measures are warranted at this time.   6.  Anemia: Related to chemotherapy and malignancy.  Her hemoglobin is adequate today and does not require any transfusion.  7. Follow-up: In 1 week to complete the current cycle and in 3 weeks with start of the next cycle.  30  minutes were dedicated to this visit.  Time spent on reviewing disease status, discussing treatment options and future plan of care review.   Zola Button, MD 3/4/20229:04 AM

## 2021-02-14 NOTE — Progress Notes (Signed)
SCr has improved from 1.38 to 1.04. Per MD maintain current dose of carboplatin 350mg  at this time.

## 2021-02-14 NOTE — Telephone Encounter (Signed)
Called and spoke to Mary Stark at [facility] and informed her that Dr. Alen Blew is requesting to add PD-L1 and Foundation One Testing to case [id number]. Per Mary Stark this will be sent out today.

## 2021-02-14 NOTE — Patient Instructions (Signed)
[facility] Discharge Instructions for Patients Receiving Chemotherapy  Today you received the following chemotherapy agents: gemcitabine/carboplatin.  To help prevent nausea and vomiting after your treatment, we encourage you to take your nausea medication as directed.   If you develop nausea and vomiting that is not controlled by your nausea medication, call the clinic.   BELOW ARE SYMPTOMS THAT SHOULD BE REPORTED IMMEDIATELY:  *FEVER GREATER THAN 100.5 F  *CHILLS WITH OR WITHOUT FEVER  NAUSEA AND VOMITING THAT IS NOT CONTROLLED WITH YOUR NAUSEA MEDICATION  *UNUSUAL SHORTNESS OF BREATH  *UNUSUAL BRUISING OR BLEEDING  TENDERNESS IN MOUTH AND THROAT WITH OR WITHOUT PRESENCE OF ULCERS  *URINARY PROBLEMS  *BOWEL PROBLEMS  UNUSUAL RASH Items with * indicate a potential emergency and should be followed up as soon as possible.  Feel free to call the clinic should you have any questions or concerns. The clinic phone number is [phone].  Please show the [place] at check-in to the Emergency Department and triage nurse.

## 2021-02-17 MED FILL — MYRBETRIQ ER 25 MG TABLET: 25 | 30 days supply | Qty: 30 | Fill #2

## 2021-02-17 MED FILL — TAMSULOSIN HCL 0.4 MG CAP: 0.4 | 30 days supply | Qty: 30 | Fill #2

## 2021-02-18 ENCOUNTER — Other Ambulatory Visit (HOSPITAL_COMMUNITY): Payer: Self-pay | Admitting: Adult Health

## 2021-02-18 MED FILL — CEPHALEXIN 500 MG CAPSULE: 500 | 7 days supply | Qty: 14 | Fill #0

## 2021-02-18 MED FILL — URO-MP CAPSULE: 118 | 6 days supply | Qty: 20 | Fill #0

## 2021-02-19 ENCOUNTER — Other Ambulatory Visit: Payer: Self-pay | Admitting: Osteopathic Medicine

## 2021-02-19 MED FILL — ARIPIPRAZOLE 10 MG TABS: 10 | 90 days supply | Qty: 90 | Fill #0

## 2021-02-19 MED FILL — clonazePAM 0.5 MG TABS: 0.5 | 30 days supply | Qty: 30 | Fill #0

## 2021-02-21 ENCOUNTER — Inpatient Hospital Stay: Payer: No Typology Code available for payment source

## 2021-02-21 ENCOUNTER — Other Ambulatory Visit: Payer: Self-pay

## 2021-02-21 VITALS — BP 111/94 | HR 66 | Temp 98.0°F | Resp 18 | Wt 189.8 lb

## 2021-02-21 DIAGNOSIS — Z95828 Presence of other vascular implants and grafts: Secondary | ICD-10-CM

## 2021-02-21 DIAGNOSIS — C801 Malignant (primary) neoplasm, unspecified: Secondary | ICD-10-CM

## 2021-02-21 DIAGNOSIS — C669 Malignant neoplasm of unspecified ureter: Secondary | ICD-10-CM

## 2021-02-21 DIAGNOSIS — Z5111 Encounter for antineoplastic chemotherapy: Secondary | ICD-10-CM | POA: Diagnosis not present

## 2021-02-21 LAB — CBC WITH DIFFERENTIAL (CANCER CENTER ONLY)
Abs Immature Granulocytes: 0.04 10*3/uL (ref 0.00–0.07)
Basophils Absolute: 0 10*3/uL (ref 0.0–0.1)
Basophils Relative: 2 %
Eosinophils Absolute: 0 10*3/uL (ref 0.0–0.5)
Eosinophils Relative: 1 %
HCT: 25.9 % — ABNORMAL LOW (ref 36.0–46.0)
Hemoglobin: 8.2 g/dL — ABNORMAL LOW (ref 12.0–15.0)
Immature Granulocytes: 2 %
Lymphocytes Relative: 38 %
Lymphs Abs: 1 10*3/uL (ref 0.7–4.0)
MCH: 32.9 pg (ref 26.0–34.0)
MCHC: 31.7 g/dL (ref 30.0–36.0)
MCV: 104 fL — ABNORMAL HIGH (ref 80.0–100.0)
Monocytes Absolute: 0.5 10*3/uL (ref 0.1–1.0)
Monocytes Relative: 18 %
Neutro Abs: 1.1 10*3/uL — ABNORMAL LOW (ref 1.7–7.7)
Neutrophils Relative %: 39 %
Platelet Count: 154 10*3/uL (ref 150–400)
RBC: 2.49 MIL/uL — ABNORMAL LOW (ref 3.87–5.11)
RDW: 19.5 % — ABNORMAL HIGH (ref 11.5–15.5)
WBC Count: 2.7 10*3/uL — ABNORMAL LOW (ref 4.0–10.5)
nRBC: 0 % (ref 0.0–0.2)

## 2021-02-21 LAB — CMP (CANCER CENTER ONLY)
ALT: 42 U/L (ref 0–44)
AST: 29 U/L (ref 15–41)
Albumin: 3.7 g/dL (ref 3.5–5.0)
Alkaline Phosphatase: 90 U/L (ref 38–126)
Anion gap: 7 (ref 5–15)
BUN: 16 mg/dL (ref 8–23)
CO2: 23 mmol/L (ref 22–32)
Calcium: 9 mg/dL (ref 8.9–10.3)
Chloride: 108 mmol/L (ref 98–111)
Creatinine: 0.98 mg/dL (ref 0.44–1.00)
GFR, Estimated: >60 mL/min (ref >60)
Glucose, Bld: 111 mg/dL — ABNORMAL HIGH (ref 70–99)
Potassium: 4.2 mmol/L (ref 3.5–5.1)
Sodium: 138 mmol/L (ref 135–145)
Total Bilirubin: <0.2 mg/dL — ABNORMAL LOW (ref 0.3–1.2)
Total Protein: 7.5 g/dL (ref 6.5–8.1)

## 2021-02-21 LAB — SAMPLE TO BLOOD BANK

## 2021-02-21 MED ORDER — SODIUM CHLORIDE 0.9 % IV SOLN
Freq: Once | INTRAVENOUS | Status: AC
  Filled 2021-02-21: qty 250

## 2021-02-21 MED ORDER — PALONOSETRON HCL INJECTION 0.25 MG/5ML
0.2500 mg | Freq: Once | INTRAVENOUS | Status: AC
  Administered 2021-02-21: 0.25 mg via INTRAVENOUS

## 2021-02-21 MED ORDER — SODIUM CHLORIDE 0.9 % IV SOLN
1000.0000 mg/m2 | Freq: Once | INTRAVENOUS | Status: AC
  Administered 2021-02-21: 2014 mg via INTRAVENOUS
  Filled 2021-02-21: qty 52.97

## 2021-02-21 MED ORDER — SODIUM CHLORIDE 0.9% FLUSH
10.0000 mL | Freq: Once | INTRAVENOUS | Status: AC
  Administered 2021-02-21: 10 mL
  Filled 2021-02-21: qty 10

## 2021-02-21 MED ORDER — SODIUM CHLORIDE 0.9% FLUSH
10.0000 mL | INTRAVENOUS | Status: DC | PRN
  Administered 2021-02-21: 10 mL
  Filled 2021-02-21: qty 10

## 2021-02-21 MED ORDER — HEPARIN SOD (PORK) LOCK FLUSH 100 UNIT/ML IV SOLN
500.0000 [IU] | Freq: Once | INTRAVENOUS | Status: AC | PRN
  Administered 2021-02-21: 500 [IU]
  Filled 2021-02-21: qty 5

## 2021-02-21 NOTE — Progress Notes (Signed)
ANC 1.1, MS states okay to treat

## 2021-02-21 NOTE — Patient Instructions (Signed)
[facility] Discharge Instructions for Patients Receiving Chemotherapy  Today you received the following chemotherapy agents gemcitabine(Gemzar) To help prevent nausea and vomiting after your treatment, we encourage you to take your nausea medication  As directed.  If you develop nausea and vomiting that is not controlled by your nausea medication, call the clinic.   BELOW ARE SYMPTOMS THAT SHOULD BE REPORTED IMMEDIATELY:  *FEVER GREATER THAN 100.5 F  *CHILLS WITH OR WITHOUT FEVER  NAUSEA AND VOMITING THAT IS NOT CONTROLLED WITH YOUR NAUSEA MEDICATION  *UNUSUAL SHORTNESS OF BREATH  *UNUSUAL BRUISING OR BLEEDING  TENDERNESS IN MOUTH AND THROAT WITH OR WITHOUT PRESENCE OF ULCERS  *URINARY PROBLEMS  *BOWEL PROBLEMS  UNUSUAL RASH Items with * indicate a potential emergency and should be followed up as soon as possible.  Feel free to call the clinic should you have any questions or concerns. The clinic phone number is [phone].  Please show the [place] at check-in to the Emergency Department and triage nurse.

## 2021-02-21 NOTE — Patient Instructions (Signed)
Implanted Port Insertion, Care After This sheet gives you information about how to care for yourself after your procedure. Your health care provider may also give you more specific instructions. If you have problems or questions, contact your health care provider. What can I expect after the procedure? After the procedure, it is common to have:  Discomfort at the port insertion site.  Bruising on the skin over the port. This should improve over 3-4 days. Follow these instructions at home: Port care  After your port is placed, you will get a manufacturer's information card. The card has information about your port. Keep this card with you at all times.  Take care of the port as told by your health care provider. Ask your health care provider if you or a family member can get training for taking care of the port at home. A home health care nurse may also take care of the port.  Make sure to remember what type of port you have. Incision care  Follow instructions from your health care provider about how to take care of your port insertion site. Make sure you: ? Wash your hands with soap and water before and after you change your bandage (dressing). If soap and water are not available, use hand sanitizer. ? Change your dressing as told by your health care provider. ? Leave stitches (sutures), skin glue, or adhesive strips in place. These skin closures may need to stay in place for 2 weeks or longer. If adhesive strip edges start to loosen and curl up, you may trim the loose edges. Do not remove adhesive strips completely unless your health care provider tells you to do that.  Check your port insertion site every day for signs of infection. Check for: ? Redness, swelling, or pain. ? Fluid or blood. ? Warmth. ? Pus or a bad smell.      Activity  Return to your normal activities as told by your health care provider. Ask your health care provider what activities are safe for you.  Do not  lift anything that is heavier than 10 lb (4.5 kg), or the limit that you are told, until your health care provider says that it is safe. General instructions  Take over-the-counter and prescription medicines only as told by your health care provider.  Do not take baths, swim, or use a hot tub until your health care provider approves. Ask your health care provider if you may take showers. You may only be allowed to take sponge baths.  Do not drive for 24 hours if you were given a sedative during your procedure.  Wear a medical alert bracelet in case of an emergency. This will tell any health care providers that you have a port.  Keep all follow-up visits as told by your health care provider. This is important. Contact a health care provider if:  You cannot flush your port with saline as directed, or you cannot draw blood from the port.  You have a fever or chills.  You have redness, swelling, or pain around your port insertion site.  You have fluid or blood coming from your port insertion site.  Your port insertion site feels warm to the touch.  You have pus or a bad smell coming from the port insertion site. Get help right away if:  You have chest pain or shortness of breath.  You have bleeding from your port that you cannot control. Summary  Take care of the port as told by your   health care provider. Keep the manufacturer's information card with you at all times.  Change your dressing as told by your health care provider.  Contact a health care provider if you have a fever or chills or if you have redness, swelling, or pain around your port insertion site.  Keep all follow-up visits as told by your health care provider. This information is not intended to replace advice given to you by your health care provider. Make sure you discuss any questions you have with your health care provider. Document Revised: 06/28/2018 Document Reviewed: 06/28/2018 Elsevier Patient Education   2021 [employer]  

## 2021-02-25 ENCOUNTER — Encounter (HOSPITAL_COMMUNITY): Payer: Self-pay | Admitting: Oncology

## 2021-03-06 ENCOUNTER — Inpatient Hospital Stay (HOSPITAL_BASED_OUTPATIENT_CLINIC_OR_DEPARTMENT_OTHER): Payer: No Typology Code available for payment source | Admitting: Oncology

## 2021-03-06 ENCOUNTER — Telehealth: Payer: Self-pay

## 2021-03-06 ENCOUNTER — Inpatient Hospital Stay: Payer: No Typology Code available for payment source

## 2021-03-06 ENCOUNTER — Other Ambulatory Visit: Payer: Self-pay

## 2021-03-06 VITALS — BP 156/76 | HR 73 | Temp 97.8°F | Resp 16 | Ht 66.0 in | Wt 194.5 lb

## 2021-03-06 DIAGNOSIS — Z95828 Presence of other vascular implants and grafts: Secondary | ICD-10-CM

## 2021-03-06 DIAGNOSIS — Z5111 Encounter for antineoplastic chemotherapy: Secondary | ICD-10-CM | POA: Diagnosis not present

## 2021-03-06 DIAGNOSIS — C801 Malignant (primary) neoplasm, unspecified: Secondary | ICD-10-CM

## 2021-03-06 DIAGNOSIS — C669 Malignant neoplasm of unspecified ureter: Secondary | ICD-10-CM | POA: Diagnosis not present

## 2021-03-06 LAB — CBC WITH DIFFERENTIAL (CANCER CENTER ONLY)
Abs Immature Granulocytes: 0.02 10*3/uL (ref 0.00–0.07)
Basophils Absolute: 0 10*3/uL (ref 0.0–0.1)
Basophils Relative: 1 %
Eosinophils Absolute: 0.1 10*3/uL (ref 0.0–0.5)
Eosinophils Relative: 3 %
HCT: 24.4 % — ABNORMAL LOW (ref 36.0–46.0)
Hemoglobin: 7.5 g/dL — ABNORMAL LOW (ref 12.0–15.0)
Immature Granulocytes: 1 %
Lymphocytes Relative: 36 %
Lymphs Abs: 1.3 10*3/uL (ref 0.7–4.0)
MCH: 33.8 pg (ref 26.0–34.0)
MCHC: 30.7 g/dL (ref 30.0–36.0)
MCV: 109.9 fL — ABNORMAL HIGH (ref 80.0–100.0)
Monocytes Absolute: 0.7 10*3/uL (ref 0.1–1.0)
Monocytes Relative: 20 %
Neutro Abs: 1.5 10*3/uL — ABNORMAL LOW (ref 1.7–7.7)
Neutrophils Relative %: 39 %
Platelet Count: 195 10*3/uL (ref 150–400)
RBC: 2.22 MIL/uL — ABNORMAL LOW (ref 3.87–5.11)
RDW: 20.2 % — ABNORMAL HIGH (ref 11.5–15.5)
WBC Count: 3.7 10*3/uL — ABNORMAL LOW (ref 4.0–10.5)
nRBC: 0 % (ref 0.0–0.2)

## 2021-03-06 LAB — CMP (CANCER CENTER ONLY)
ALT: 15 U/L (ref 0–44)
AST: 20 U/L (ref 15–41)
Albumin: 3.7 g/dL (ref 3.5–5.0)
Alkaline Phosphatase: 79 U/L (ref 38–126)
Anion gap: 10 (ref 5–15)
BUN: 14 mg/dL (ref 8–23)
CO2: 22 mmol/L (ref 22–32)
Calcium: 8.6 mg/dL — ABNORMAL LOW (ref 8.9–10.3)
Chloride: 108 mmol/L (ref 98–111)
Creatinine: 1.07 mg/dL — ABNORMAL HIGH (ref 0.44–1.00)
GFR, Estimated: 58 mL/min — ABNORMAL LOW (ref >60)
Glucose, Bld: 99 mg/dL (ref 70–99)
Potassium: 4.6 mmol/L (ref 3.5–5.1)
Sodium: 140 mmol/L (ref 135–145)
Total Bilirubin: 0.3 mg/dL (ref 0.3–1.2)
Total Protein: 7.3 g/dL (ref 6.5–8.1)

## 2021-03-06 LAB — SAMPLE TO BLOOD BANK

## 2021-03-06 MED ORDER — HEPARIN SOD (PORK) LOCK FLUSH 100 UNIT/ML IV SOLN
500.0000 [IU] | Freq: Once | INTRAVENOUS | Status: AC | PRN
  Administered 2021-03-06: 500 [IU]
  Filled 2021-03-06: qty 5

## 2021-03-06 MED ORDER — PALONOSETRON HCL INJECTION 0.25 MG/5ML
INTRAVENOUS | Status: AC
  Filled 2021-03-06: qty 5

## 2021-03-06 MED ORDER — SODIUM CHLORIDE 0.9% FLUSH
10.0000 mL | INTRAVENOUS | Status: DC | PRN
  Administered 2021-03-06: 10 mL
  Filled 2021-03-06: qty 10

## 2021-03-06 MED ORDER — SODIUM CHLORIDE 0.9 % IV SOLN
394.4000 mg | Freq: Once | INTRAVENOUS | Status: AC
  Administered 2021-03-06: 390 mg via INTRAVENOUS
  Filled 2021-03-06: qty 39

## 2021-03-06 MED ORDER — SODIUM CHLORIDE 0.9 % IV SOLN
10.0000 mg | Freq: Once | INTRAVENOUS | Status: AC
  Administered 2021-03-06: 10 mg via INTRAVENOUS
  Filled 2021-03-06: qty 10

## 2021-03-06 MED ORDER — SODIUM CHLORIDE 0.9 % IV SOLN
1000.0000 mg/m2 | Freq: Once | INTRAVENOUS | Status: AC
  Administered 2021-03-06: 2014 mg via INTRAVENOUS
  Filled 2021-03-06: qty 52.97

## 2021-03-06 MED ORDER — SODIUM CHLORIDE 0.9 % IV SOLN
Freq: Once | INTRAVENOUS | Status: AC
  Filled 2021-03-06: qty 250

## 2021-03-06 MED ORDER — PALONOSETRON HCL INJECTION 0.25 MG/5ML
0.2500 mg | Freq: Once | INTRAVENOUS | Status: AC
  Administered 2021-03-06: 0.25 mg via INTRAVENOUS

## 2021-03-06 MED ORDER — SODIUM CHLORIDE 0.9 % IV SOLN
150.0000 mg | Freq: Once | INTRAVENOUS | Status: AC
  Administered 2021-03-06: 150 mg via INTRAVENOUS
  Filled 2021-03-06: qty 150

## 2021-03-06 MED ORDER — SODIUM CHLORIDE 0.9% FLUSH
10.0000 mL | Freq: Once | INTRAVENOUS | Status: AC
  Administered 2021-03-06: 10 mL
  Filled 2021-03-06: qty 10

## 2021-03-06 NOTE — Telephone Encounter (Signed)
Received message below from Dr. Alen Blew.  Can we arrange for 2 units of blood instead of her chemo on 3/31? Thanks  Scheduling message sent to B.K. in scheduling.

## 2021-03-06 NOTE — Progress Notes (Signed)
Per Dr. Alen Blew, ok to treat with Hgb 7.5.

## 2021-03-06 NOTE — Patient Instructions (Signed)
[facility] Discharge Instructions for Patients Receiving Chemotherapy  Today you received the following chemotherapy agents: Gemcitabine (Gemzar) and Carboplatin (Paraplatin)  To help prevent nausea and vomiting after your treatment, we encourage you to take your nausea medication as directed by your provider.   If you develop nausea and vomiting that is not controlled by your nausea medication, call the clinic.   BELOW ARE SYMPTOMS THAT SHOULD BE REPORTED IMMEDIATELY:  *FEVER GREATER THAN 100.5 F  *CHILLS WITH OR WITHOUT FEVER  NAUSEA AND VOMITING THAT IS NOT CONTROLLED WITH YOUR NAUSEA MEDICATION  *UNUSUAL SHORTNESS OF BREATH  *UNUSUAL BRUISING OR BLEEDING  TENDERNESS IN MOUTH AND THROAT WITH OR WITHOUT PRESENCE OF ULCERS  *URINARY PROBLEMS  *BOWEL PROBLEMS  UNUSUAL RASH Items with * indicate a potential emergency and should be followed up as soon as possible.  Feel free to call the clinic should you have any questions or concerns. The clinic phone number is [phone].  Please show the [facility] at check-in to the Emergency Department and triage nurse.

## 2021-03-06 NOTE — Progress Notes (Signed)
Hematology and Oncology Follow Up Visit  Mary Stark [id number] [birth year] 65 y.o. 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  The exact pathology is likely consistent with urothelial versus cancer of the rectus.   Prior Therapy:  She is status post retroperitoneal lymph node biopsy completed on 10/24/2020 which showed metastatic carcinoma.  She is status post repeat cystoscopy and bilateral ureteral stent exchange as well as resection of a bladder tumor on January 21, 2021.  A final pathology showed an adenocarcinoma.  Current therapy: Chemotherapy utilizing carboplatin and gemcitabine started on November 22, 2020.  She is here for day 1 of cycle 6   Interim History: [name] is here for a follow-up visit.  Since the last visit, she reports no major changes in her health.  She denies any nausea, vomiting or abdominal pain.  She denies any cough or fevers.  She reports slight dyspnea on exertion.  She denies any cough or palpitation.     Medications: Unchanged on review. Current Outpatient Medications  Medication Sig Dispense Refill  . acetaminophen (TYLENOL) 500 MG tablet Take 1,000 mg by mouth every 8 (eight) hours as needed for moderate pain.     Marland Kitchen albuterol (PROAIR HFA) 108 (90 Base) MCG/ACT inhaler INHALE 1 TO 2 PUFFS BY MOUTH INTO THE LUNGS EVERY 4 HOURS AS NEEDED FOR WHEEZING OR SHORTNESS OF BREATH (Patient taking differently: Inhale 1-2 puffs into the lungs every 4 (four) hours as needed for wheezing or shortness of breath. INHALE 1 TO 2 PUFFS BY MOUTH INTO THE LUNGS EVERY 4 HOURS AS NEEDED FOR WHEEZING OR SHORTNESS OF BREATH) 8.5 g 1  . amLODipine (NORVASC) 5 MG tablet Take 1 tablet (5 mg total) by mouth daily. appt for refills 90 tablet 0  . ARIPiprazole 10 MG TABS Take 10 mg by  mouth daily. (Patient taking differently: Take 10 mg by mouth at bedtime. abilify) 90 tablet 1  . ascorbic acid (VITAMIN C) 500 MG tablet Take 1,000 mg by mouth every evening.     . ASPIRIN LOW DOSE 81 MG EC tablet TAKE 1 TABLET BY MOUTH DAILY. (Patient taking differently: Take 81 mg by mouth daily.) 90 tablet 3  . buPROPion (WELLBUTRIN XL) 300 MG 24 hr tablet Take 1 tablet (300 mg total) by mouth daily. 90 tablet 1  . BYSTOLIC 20 MG TABS TAKE 1 TABLET (20 MG TOTAL) BY MOUTH DAILY. (Patient taking differently: Take 20 mg by mouth daily.) 90 tablet 1  . Cholecalciferol (VITAMIN D) 125 MCG (5000 UT) CAPS Take 5,000 Units by mouth every evening.     . clonazePAM (KLONOPIN) 0.5 MG tablet TAKE 1 TABLET (0.5 MG TOTAL) BY MOUTH AT BEDTIME. 30 tablet 1  . Coenzyme Q10 (CO Q-10) 200 MG CAPS Take 200 mg by mouth every evening.     . fexofenadine (ALLEGRA) 180 MG tablet Take 180 mg by mouth every evening.     . furosemide (LASIX) 20 MG tablet Take one tab daily by mouth. 90 tablet 3  . gabapentin (NEURONTIN) 300 MG capsule 1  qam   4   qhs (Patient taking differently: Take 300-1,200 mg by mouth See admin instructions. Take 300 mg by mouth in the morning and afternoon and 1200 mg at night) 150 capsule 5  . GLUCOSAMINE-CHONDROITIN PO Take 2 tablets by mouth every evening.     Marland Kitchen  guaiFENesin-dextromethorphan (ROBITUSSIN DM) 100-10 MG/5ML syrup Take 10 mLs by mouth every 4 (four) hours as needed for cough. 473 mL 0  . lidocaine-prilocaine (EMLA) cream Apply 1 application topically as needed. 30 g 0  . MYRBETRIQ 25 MG TB24 tablet Take 25 mg by mouth daily.    . nitrofurantoin (MACRODANTIN) 100 MG capsule Take 1 capsule (100 mg total) by mouth at bedtime. 90 capsule 1  . Omega-3 Fatty Acids (FISH OIL) 1200 MG CAPS Take 1,200 mg by mouth every evening.     Marland Kitchen omeprazole (PRILOSEC) 20 MG capsule Take 1 capsule (20 mg total) by mouth daily. appt for further refills 90 capsule 0  . ondansetron (ZOFRAN-ODT) 4 MG  disintegrating tablet Take 1 tablet (4 mg total) by mouth every 8 (eight) hours as needed for nausea or vomiting. 12 tablet 0  . oxyCODONE-acetaminophen (PERCOCET) 10-325 MG tablet Take 1 tablet by mouth every 8 (eight) hours as needed for pain. 90 tablet 0  . simvastatin (ZOCOR) 20 MG tablet TAKE 1 TABLET (20 MG TOTAL) BY MOUTH DAILY. (Patient taking differently: Take 20 mg by mouth daily at 6 PM.) 90 tablet 1  . tamsulosin (FLOMAX) 0.4 MG CAPS capsule Take 0.4 mg by mouth at bedtime.    . thiamine 100 MG tablet Take 1 tablet (100 mg total) by mouth daily. 30 tablet 0  . vitamin B-12 (CYANOCOBALAMIN) 1000 MCG tablet Take 2,000 mcg by mouth every evening.     . Vitamin E 180 MG (400 UNIT) CAPS Take 400 Units by mouth every evening.      No current facility-administered medications for this visit.     Allergies:  Allergies  Allergen Reactions  . Metformin And Related Other (See Comments)    Lactic acidosis   . Penicillins Hives and Rash    Reports hives to penicillin and amoxicillin as an adult "a long time ago." Has tolerated cefdinir (09/2020) and cefepime (10/2020) before.      Physical Exam:  Blood pressure (!) 156/76, pulse 73, temperature 97.8 F (36.6 C), temperature source Tympanic, resp. rate 16, height _0  (1.676 m), weight 194 lb 8 oz (88.2 kg), SpO2 99 %.     ECOG: 1   General appearance: Comfortable appearing without any discomfort Head: Normocephalic without any trauma Oropharynx: Mucous membranes are moist and pink without any thrush or ulcers. Eyes: Pupils are equal and round reactive to light. Lymph nodes: No cervical, supraclavicular, inguinal or axillary lymphadenopathy.   Heart:regular rate and rhythm.  S1 and S2 without leg edema. Lung: Clear without any rhonchi or wheezes.  No dullness to percussion. Abdomin: Soft, nontender, nondistended with good bowel sounds.  No hepatosplenomegaly. Musculoskeletal: No joint deformity or effusion.  Full range of  motion noted. Neurological: No deficits noted on motor, sensory and deep tendon reflex exam. Skin: No petechial rash or dryness.  Appeared moist.           Lab Results: Lab Results  Component Value Date   WBC 2.7 (L) 02/21/2021   HGB 8.2 (L) 02/21/2021   HCT 25.9 (L) 02/21/2021   MCV 104.0 (H) 02/21/2021   PLT 154 02/21/2021     Chemistry      Component Value Date/Time   NA 138 02/21/2021 1109   NA 142 04/26/2018 1333   K 4.2 02/21/2021 1109   CL 108 02/21/2021 1109   CO2 23 02/21/2021 1109   BUN 16 02/21/2021 1109   BUN 22 04/26/2018 1333   CREATININE 0.98 02/21/2021  1109   CREATININE 0.83 09/22/2020 1330   GLU 87 02/07/2013 0000      Component Value Date/Time   CALCIUM 9.0 02/21/2021 1109   ALKPHOS 90 02/21/2021 1109   AST 29 02/21/2021 1109   ALT 42 02/21/2021 1109   BILITOT <0.2 (L) 02/21/2021 1109       Impression and Plan:    65 year old woman with:  1.    Stage IV malignancy of the genitourinary tract with lymphadenopathy noted in November 2021.  The natural course of her disease was reviewed today and the differential diagnosis was reiterated.  PD-L1 score is 1 and next generation sequencing did not show any actionable mutation.  Microsatellite status is stable with no increased tumor mutational burden.  If pathology is more consistent with adenocarcinoma other than urothelial carcinoma.  Risks and benefits of continuing this chemotherapy versus switching to a different regimen were reviewed.  I recommended that completing cycle 6 of therapy and updating her scans afterwards.  Different salvage therapy options including FOLFOX or potential single agent Pembrolizumab given that her PD-L1 score is not 0.   After discussion today, she is agreeable to proceed and we will arrange for repeat CT scan in April and consideration for different salvage therapy after that.  We will eliminate day 8 gemcitabine because of cytopenia and use that appointment for  transfusion.   2. IV access:Port-A-Cath remains in use without any issues.  3. Antiemetics: Compazine is available to her without any nausea vomiting.  4. Renal function surveillance: Kidney function remained stable on platinum therapy.  5. Goals of care:Her disease is incurable although aggressive measures are warranted given her reasonable performance status.   6.  Anemia: Hemoglobin has been in the 8-9 range related to malignancy and chemotherapy.  Laboratory data reviewed today and hemoglobin currently at 7.5 and will arrange for back red cell transfusion next week.  7. Follow-up: Will be in the next few weeks for repeating scans and MD follow-up.  30  minutes were spent on updating her disease status, discussing treatment options and outlining future plan of care.   Zola Button, MD 3/24/202211:12 AM

## 2021-03-06 NOTE — Patient Instructions (Signed)

## 2021-03-12 ENCOUNTER — Other Ambulatory Visit: Payer: Self-pay | Admitting: Osteopathic Medicine

## 2021-03-12 DIAGNOSIS — Z791 Long term (current) use of non-steroidal anti-inflammatories (NSAID): Secondary | ICD-10-CM

## 2021-03-12 MED FILL — OMEPRAZOLE 20 MG CAP: 20 | 90 days supply | Qty: 90 | Fill #0

## 2021-03-12 MED FILL — buPROPion HCL ER (XL) 300 M: 300 | 30 days supply | Qty: 30 | Fill #3

## 2021-03-13 ENCOUNTER — Inpatient Hospital Stay: Payer: No Typology Code available for payment source

## 2021-03-13 ENCOUNTER — Other Ambulatory Visit: Payer: No Typology Code available for payment source

## 2021-03-13 ENCOUNTER — Other Ambulatory Visit: Payer: Self-pay | Admitting: *Deleted

## 2021-03-13 ENCOUNTER — Other Ambulatory Visit: Payer: Self-pay

## 2021-03-13 ENCOUNTER — Ambulatory Visit: Payer: No Typology Code available for payment source

## 2021-03-13 DIAGNOSIS — C669 Malignant neoplasm of unspecified ureter: Secondary | ICD-10-CM

## 2021-03-13 DIAGNOSIS — C801 Malignant (primary) neoplasm, unspecified: Secondary | ICD-10-CM

## 2021-03-13 DIAGNOSIS — Z95828 Presence of other vascular implants and grafts: Secondary | ICD-10-CM

## 2021-03-13 DIAGNOSIS — Z5111 Encounter for antineoplastic chemotherapy: Secondary | ICD-10-CM | POA: Diagnosis not present

## 2021-03-13 LAB — CBC WITH DIFFERENTIAL (CANCER CENTER ONLY)
Abs Immature Granulocytes: 0.06 10*3/uL (ref 0.00–0.07)
Basophils Absolute: 0 10*3/uL (ref 0.0–0.1)
Basophils Relative: 1 %
Eosinophils Absolute: 0 10*3/uL (ref 0.0–0.5)
Eosinophils Relative: 1 %
HCT: 23.2 % — ABNORMAL LOW (ref 36.0–46.0)
Hemoglobin: 7.5 g/dL — ABNORMAL LOW (ref 12.0–15.0)
Immature Granulocytes: 2 %
Lymphocytes Relative: 58 %
Lymphs Abs: 1.5 10*3/uL (ref 0.7–4.0)
MCH: 34.9 pg — ABNORMAL HIGH (ref 26.0–34.0)
MCHC: 32.3 g/dL (ref 30.0–36.0)
MCV: 107.9 fL — ABNORMAL HIGH (ref 80.0–100.0)
Monocytes Absolute: 0.3 10*3/uL (ref 0.1–1.0)
Monocytes Relative: 12 %
Neutro Abs: 0.7 10*3/uL — ABNORMAL LOW (ref 1.7–7.7)
Neutrophils Relative %: 26 %
Platelet Count: 230 10*3/uL (ref 150–400)
RBC: 2.15 MIL/uL — ABNORMAL LOW (ref 3.87–5.11)
RDW: 17.2 % — ABNORMAL HIGH (ref 11.5–15.5)
WBC Count: 2.6 10*3/uL — ABNORMAL LOW (ref 4.0–10.5)
nRBC: 0.8 % — ABNORMAL HIGH (ref 0.0–0.2)

## 2021-03-13 LAB — CMP (CANCER CENTER ONLY)
ALT: 17 U/L (ref 0–44)
AST: 23 U/L (ref 15–41)
Albumin: 3.7 g/dL (ref 3.5–5.0)
Alkaline Phosphatase: 88 U/L (ref 38–126)
Anion gap: 9 (ref 5–15)
BUN: 24 mg/dL — ABNORMAL HIGH (ref 8–23)
CO2: 23 mmol/L (ref 22–32)
Calcium: 8.4 mg/dL — ABNORMAL LOW (ref 8.9–10.3)
Chloride: 106 mmol/L (ref 98–111)
Creatinine: 1.13 mg/dL — ABNORMAL HIGH (ref 0.44–1.00)
GFR, Estimated: 55 mL/min — ABNORMAL LOW (ref >60)
Glucose, Bld: 91 mg/dL (ref 70–99)
Potassium: 4.6 mmol/L (ref 3.5–5.1)
Sodium: 138 mmol/L (ref 135–145)
Total Bilirubin: <0.2 mg/dL — ABNORMAL LOW (ref 0.3–1.2)
Total Protein: 7.2 g/dL (ref 6.5–8.1)

## 2021-03-13 LAB — SAMPLE TO BLOOD BANK

## 2021-03-13 LAB — PREPARE RBC (CROSSMATCH)

## 2021-03-13 MED ORDER — DIPHENHYDRAMINE HCL 25 MG PO CAPS
25.0000 mg | ORAL_CAPSULE | Freq: Once | ORAL | Status: AC
  Administered 2021-03-13: 25 mg via ORAL

## 2021-03-13 MED ORDER — ACETAMINOPHEN 325 MG PO TABS
ORAL_TABLET | ORAL | Status: AC
  Filled 2021-03-13: qty 2

## 2021-03-13 MED ORDER — DIPHENHYDRAMINE HCL 25 MG PO CAPS
ORAL_CAPSULE | ORAL | Status: AC
  Filled 2021-03-13: qty 1

## 2021-03-13 MED ORDER — SODIUM CHLORIDE 0.9% FLUSH
3.0000 mL | INTRAVENOUS | Status: AC | PRN
  Filled 2021-03-13: qty 10

## 2021-03-13 MED ORDER — SODIUM CHLORIDE 0.9% FLUSH
10.0000 mL | Freq: Once | INTRAVENOUS | Status: AC
Start: 2021-03-13 — End: 2021-03-13
  Administered 2021-03-13: 10 mL
  Filled 2021-03-13: qty 10

## 2021-03-13 MED ORDER — ACETAMINOPHEN 325 MG PO TABS
650.0000 mg | ORAL_TABLET | Freq: Once | ORAL | Status: AC
  Administered 2021-03-13: 650 mg via ORAL

## 2021-03-13 MED ORDER — SODIUM CHLORIDE 0.9% IV SOLUTION
250.0000 mL | Freq: Once | INTRAVENOUS | Status: AC
  Administered 2021-03-13: 250 mL via INTRAVENOUS
  Filled 2021-03-13: qty 250

## 2021-03-13 MED ORDER — HEPARIN SOD (PORK) LOCK FLUSH 100 UNIT/ML IV SOLN
250.0000 [IU] | INTRAVENOUS | Status: AC | PRN
  Administered 2021-03-13: 500 [IU]
  Filled 2021-03-13: qty 5

## 2021-03-13 NOTE — Progress Notes (Signed)
Verbal order from Dr. Alen Blew 2 units PRBC's, orders entered & confirmed with blood bank.

## 2021-03-13 NOTE — Patient Instructions (Signed)

## 2021-03-14 LAB — TYPE AND SCREEN
ABO/RH(D): A POS
Antibody Screen: NEGATIVE
Unit division: 0
Unit division: 0

## 2021-03-14 LAB — BPAM RBC
Blood Product Expiration Date: 202204052359
Blood Product Expiration Date: 202204212359
ISSUE DATE / TIME: 202203311352
ISSUE DATE / TIME: 202203311545
Unit Type and Rh: 6200
Unit Type and Rh: 6200

## 2021-03-21 ENCOUNTER — Other Ambulatory Visit: Payer: Self-pay | Admitting: Osteopathic Medicine

## 2021-03-21 ENCOUNTER — Other Ambulatory Visit (HOSPITAL_COMMUNITY): Payer: Self-pay

## 2021-03-21 DIAGNOSIS — I10 Essential (primary) hypertension: Secondary | ICD-10-CM

## 2021-03-21 MED ORDER — AMLODIPINE BESYLATE 5 MG PO TABS
ORAL_TABLET | Freq: Every day | ORAL | 0 refills | Status: DC
  Filled 2021-03-21: qty 90, 90d supply, fill #0

## 2021-03-21 MED FILL — Mirabegron Tab ER 24 HR 25 MG: ORAL | 30 days supply | Qty: 30 | Fill #0 | Status: AC

## 2021-03-21 MED FILL — Tamsulosin HCl Cap 0.4 MG: ORAL | 30 days supply | Qty: 30 | Fill #0 | Status: AC

## 2021-03-21 MED FILL — Aspirin Tab Delayed Release 81 MG: ORAL | 90 days supply | Qty: 90 | Fill #0 | Status: AC

## 2021-03-21 MED FILL — Clonazepam Tab 0.5 MG: ORAL | 30 days supply | Qty: 30 | Fill #0 | Status: AC

## 2021-03-24 ENCOUNTER — Other Ambulatory Visit (HOSPITAL_COMMUNITY): Payer: Self-pay

## 2021-03-27 ENCOUNTER — Ambulatory Visit (HOSPITAL_COMMUNITY)
Admission: RE | Admit: 2021-03-27 | Discharge: 2021-03-27 | Disposition: A | Discharge status: Home / Self Care | Payer: No Typology Code available for payment source | Source: Ambulatory Visit | Attending: Oncology | Admitting: Oncology

## 2021-03-27 ENCOUNTER — Inpatient Hospital Stay: Payer: No Typology Code available for payment source

## 2021-03-27 ENCOUNTER — Inpatient Hospital Stay: Payer: No Typology Code available for payment source | Attending: Oncology

## 2021-03-27 ENCOUNTER — Other Ambulatory Visit: Payer: Self-pay

## 2021-03-27 ENCOUNTER — Other Ambulatory Visit: Payer: No Typology Code available for payment source

## 2021-03-27 DIAGNOSIS — R590 Localized enlarged lymph nodes: Secondary | ICD-10-CM | POA: Insufficient documentation

## 2021-03-27 DIAGNOSIS — C669 Malignant neoplasm of unspecified ureter: Secondary | ICD-10-CM | POA: Diagnosis not present

## 2021-03-27 DIAGNOSIS — C801 Malignant (primary) neoplasm, unspecified: Secondary | ICD-10-CM

## 2021-03-27 DIAGNOSIS — R5383 Other fatigue: Secondary | ICD-10-CM | POA: Insufficient documentation

## 2021-03-27 DIAGNOSIS — D630 Anemia in neoplastic disease: Secondary | ICD-10-CM | POA: Insufficient documentation

## 2021-03-27 LAB — CBC WITH DIFFERENTIAL (CANCER CENTER ONLY)
Abs Immature Granulocytes: 0.07 10*3/uL (ref 0.00–0.07)
Basophils Absolute: 0.1 10*3/uL (ref 0.0–0.1)
Basophils Relative: 1 %
Eosinophils Absolute: 0.1 10*3/uL (ref 0.0–0.5)
Eosinophils Relative: 1 %
HCT: 34 % — ABNORMAL LOW (ref 36.0–46.0)
Hemoglobin: 11 g/dL — ABNORMAL LOW (ref 12.0–15.0)
Immature Granulocytes: 1 %
Lymphocytes Relative: 29 %
Lymphs Abs: 1.8 10*3/uL (ref 0.7–4.0)
MCH: 33.2 pg (ref 26.0–34.0)
MCHC: 32.4 g/dL (ref 30.0–36.0)
MCV: 102.7 fL — ABNORMAL HIGH (ref 80.0–100.0)
Monocytes Absolute: 1.1 10*3/uL — ABNORMAL HIGH (ref 0.1–1.0)
Monocytes Relative: 18 %
Neutro Abs: 3 10*3/uL (ref 1.7–7.7)
Neutrophils Relative %: 50 %
Platelet Count: 187 10*3/uL (ref 150–400)
RBC: 3.31 MIL/uL — ABNORMAL LOW (ref 3.87–5.11)
RDW: 19.9 % — ABNORMAL HIGH (ref 11.5–15.5)
WBC Count: 6.1 10*3/uL (ref 4.0–10.5)
nRBC: 0 % (ref 0.0–0.2)

## 2021-03-27 LAB — SAMPLE TO BLOOD BANK

## 2021-03-27 LAB — CMP (CANCER CENTER ONLY)
ALT: 11 U/L (ref 0–44)
AST: 18 U/L (ref 15–41)
Albumin: 3.8 g/dL (ref 3.5–5.0)
Alkaline Phosphatase: 93 U/L (ref 38–126)
Anion gap: 11 (ref 5–15)
BUN: 30 mg/dL — ABNORMAL HIGH (ref 8–23)
CO2: 19 mmol/L — ABNORMAL LOW (ref 22–32)
Calcium: 9.1 mg/dL (ref 8.9–10.3)
Chloride: 106 mmol/L (ref 98–111)
Creatinine: 1.26 mg/dL — ABNORMAL HIGH (ref 0.44–1.00)
GFR, Estimated: 48 mL/min — ABNORMAL LOW (ref >60)
Glucose, Bld: 108 mg/dL — ABNORMAL HIGH (ref 70–99)
Potassium: 4.8 mmol/L (ref 3.5–5.1)
Sodium: 136 mmol/L (ref 135–145)
Total Bilirubin: <0.2 mg/dL — ABNORMAL LOW (ref 0.3–1.2)
Total Protein: 8 g/dL (ref 6.5–8.1)

## 2021-03-27 MED ORDER — HEPARIN SOD (PORK) LOCK FLUSH 100 UNIT/ML IV SOLN
INTRAVENOUS | Status: AC
  Filled 2021-03-27: qty 5

## 2021-03-27 MED ORDER — HEPARIN SOD (PORK) LOCK FLUSH 100 UNIT/ML IV SOLN
500.0000 [IU] | Freq: Once | INTRAVENOUS | Status: AC
  Administered 2021-03-27: 500 [IU] via INTRAVENOUS

## 2021-03-27 MED ORDER — IOHEXOL 300 MG/ML  SOLN
100.0000 mL | Freq: Once | INTRAMUSCULAR | Status: AC | PRN
  Administered 2021-03-27: 100 mL via INTRAVENOUS

## 2021-03-27 NOTE — Patient Instructions (Signed)

## 2021-03-31 ENCOUNTER — Inpatient Hospital Stay (HOSPITAL_BASED_OUTPATIENT_CLINIC_OR_DEPARTMENT_OTHER): Payer: No Typology Code available for payment source | Admitting: Oncology

## 2021-03-31 ENCOUNTER — Other Ambulatory Visit: Payer: Self-pay

## 2021-03-31 VITALS — BP 160/84 | HR 80 | Temp 97.8°F | Resp 17 | Ht 66.0 in | Wt 191.1 lb

## 2021-03-31 DIAGNOSIS — C669 Malignant neoplasm of unspecified ureter: Secondary | ICD-10-CM

## 2021-03-31 DIAGNOSIS — R5383 Other fatigue: Secondary | ICD-10-CM | POA: Diagnosis not present

## 2021-03-31 DIAGNOSIS — R590 Localized enlarged lymph nodes: Secondary | ICD-10-CM | POA: Diagnosis not present

## 2021-03-31 DIAGNOSIS — D630 Anemia in neoplastic disease: Secondary | ICD-10-CM | POA: Diagnosis not present

## 2021-03-31 NOTE — Progress Notes (Signed)
Hematology and Oncology Follow Up Visit  Mary Stark [id number] [birth year] 65 y.o. 03/31/2021 9:51 AM [name], DOAlexander, [place], DO   [name]: 65 year old Stark with stage IV adenocarcinoma of Mary bladder diagnosed in November 2021.  She was found to have retroperitoneal adenopathy with Mary next generation sequencing did not show any actionable mutation and PD-L1 score of 1.   Prior Therapy:  She is status post retroperitoneal lymph node biopsy completed on 10/24/2020 which showed metastatic carcinoma.  She is status post repeat cystoscopy and bilateral ureteral stent exchange as well as resection of a bladder tumor on January 21, 2021.  A final pathology showed an adenocarcinoma.  Chemotherapy utilizing carboplatin and gemcitabine started on November 22, 2020.  She completed 6 cycles of therapy on March 06, 2021.  Current therapy: Under consideration for different salvage options.   Interim History: Mary Stark is here for return evaluation.  Since Mary last visit, she reports no major changes in her health.  She reports improvement in her fatigue and tiredness after receiving 2 units of packed red cell transfusion.  She denies any residual nausea, vomiting or abdominal pain.  She does report some fatigue and tiredness.  No neuropathy noted at this time.     Medications: Updated on review. Current Outpatient Medications  Medication Sig Dispense Refill  . acetaminophen (TYLENOL) 500 MG tablet Take 1,000 mg by mouth every 8 (eight) hours as needed for moderate pain.     Marland Kitchen albuterol (PROAIR HFA) 108 (90 Base) MCG/ACT inhaler INHALE 1 TO 2 PUFFS BY MOUTH INTO Mary LUNGS EVERY 4 HOURS AS NEEDED FOR WHEEZING OR SHORTNESS OF BREATH (Patient taking differently: Inhale 1-2 puffs into Mary lungs every 4 (four) hours as needed for wheezing or shortness of breath. INHALE 1 TO 2 PUFFS BY MOUTH INTO Mary LUNGS EVERY 4 HOURS AS NEEDED FOR WHEEZING OR SHORTNESS OF BREATH) 8.5  g 1  . amLODipine (NORVASC) 5 MG tablet TAKE 1 TABLET BY MOUTH ONCE A DAY (NEEDS OFFICE VISIT) 90 tablet 0  . ARIPiprazole (ABILIFY) 10 MG tablet TAKE 1 TABLET BY MOUTH ONCE DAILY 90 tablet 0  . ARIPiprazole (ABILIFY) 5 MG tablet TAKE 1 TABLET BY MOUTH ONCE A DAY 30 tablet 3  . ARIPiprazole 10 MG TABS Take 10 mg by mouth daily. (Patient taking differently: Take 10 mg by mouth at bedtime. abilify) 90 tablet 1  . ascorbic acid (VITAMIN C) 500 MG tablet Take 1,000 mg by mouth every evening.     Marland Kitchen aspirin 81 MG EC tablet TAKE 1 TABLET BY MOUTH DAILY. (Patient taking differently: Take 81 mg by mouth daily.) 90 tablet 1  . buPROPion (WELLBUTRIN XL) 300 MG 24 hr tablet Take 1 tablet (300 mg total) by mouth daily. 90 tablet 1  . buPROPion (WELLBUTRIN XL) 300 MG 24 hr tablet TAKE 1 TABLET BY MOUTH EVERY DAY 30 tablet 3  . buPROPion (WELLBUTRIN XL) 300 MG 24 hr tablet TAKE 1 TABLET BY MOUTH EVERY DAY 30 tablet 3  . cephALEXin (KEFLEX) 500 MG capsule TAKE 1 CAPSULE (500 MG TOTAL) BY MOUTH EVERY 8 (EIGHT) HOURS FOR 7 DAYS. 21 capsule 0  . cephALEXin (KEFLEX) 500 MG capsule TAKE 1 CAPSULE BY MOUTH 2 TIMES DAILY 14 capsule 0  . Cholecalciferol (VITAMIN D) 125 MCG (5000 UT) CAPS Take 5,000 Units by mouth every evening.     . clonazePAM (KLONOPIN) 0.5 MG tablet TAKE 1 TABLET BY MOUTH AT BEDTIME 30 tablet 1  . Coenzyme  Q10 (CO Q-10) 200 MG CAPS Take 200 mg by mouth every evening.     . DULoxetine (CYMBALTA) 30 MG capsule TAKE 1 CAPSULE BY MOUTH ONCE DAILY. 30 capsule 3  . fexofenadine (ALLEGRA) 180 MG tablet Take 180 mg by mouth every evening.     . furosemide (LASIX) 20 MG tablet TAKE 1 TABLET BY MOUTH ONCE A DAY 90 tablet 3  . gabapentin (NEURONTIN) 300 MG capsule 1  qam   4   qhs (Patient taking differently: Take 300-1,200 mg by mouth See admin instructions. Take 300 mg by mouth in Mary morning and afternoon and 1200 mg at night) 150 capsule 5  . gabapentin (NEURONTIN) 300 MG capsule TAKE 1 CAPSULE BY MOUTH IN  Mary MORNING, 1 CAPSULE IN Mary AFTERNOON & 4 CAPSULES AT NIGHT 180 capsule 3  . gabapentin (NEURONTIN) 300 MG capsule TAKE 1 CAPSULE BY MOUTH IN Mary MORNING, 1 CAP IN Mary AFTERNOON & 4 CAPS AT NIGHT 180 capsule 3  . GLUCOSAMINE-CHONDROITIN PO Take 2 tablets by mouth every evening.     Marland Kitchen guaiFENesin-dextromethorphan (ROBITUSSIN DM) 100-10 MG/5ML syrup Take 10 mLs by mouth every 4 (four) hours as needed for cough. 473 mL 0  . lidocaine-prilocaine (EMLA) cream Apply 1 application topically as needed. 30 g 0  . Meth-Hyo-M Bl-Na Phos-Ph Sal (URO-MP) 118 MG CAPS TAKE 1 CAPSULE BY MOUTH 3 TIMES DAILY AS NEEDED WITH A FULL GLASS OF WATER 20 capsule 3  . mirabegron ER (MYRBETRIQ) 25 MG TB24 tablet TAKE 1 TABLET BY MOUTH ONCE A DAY 30 tablet 3  . MYRBETRIQ 25 MG TB24 tablet Take 25 mg by mouth daily.    . Nebivolol HCl 20 MG TABS TAKE 1 TABLET BY MOUTH DAILY. (Patient taking differently: Take 20 mg by mouth daily.) 90 tablet 0  . nitrofurantoin (MACRODANTIN) 100 MG capsule Take 1 capsule (100 mg total) by mouth at bedtime. 90 capsule 1  . Omega-3 Fatty Acids (FISH OIL) 1200 MG CAPS Take 1,200 mg by mouth every evening.     Marland Kitchen omeprazole (PRILOSEC) 20 MG capsule TAKE 1 CAPSULE BY MOUTH ONCE A DAY * NEEDS APPT FOR REFILLS 90 capsule 0  . ondansetron (ZOFRAN-ODT) 4 MG disintegrating tablet Take 1 tablet (4 mg total) by mouth every 8 (eight) hours as needed for nausea or vomiting. 12 tablet 0  . oxybutynin (DITROPAN) 5 MG tablet TAKE 1 TABLET BY MOUTH EVERY 8 HOURS AS NEEDED 90 tablet 0  . oxyCODONE-acetaminophen (PERCOCET) 10-325 MG tablet Take 1 tablet by mouth every 8 (eight) hours as needed for pain. 90 tablet 0  . simvastatin (ZOCOR) 20 MG tablet TAKE 1 TABLET (20 MG TOTAL) BY MOUTH DAILY. (Patient taking differently: Take 20 mg by mouth daily at 6 PM.) 90 tablet 0  . sodium bicarbonate 650 MG tablet TAKE 1 TABLET (650 MG TOTAL) BY MOUTH 2 (TWO) TIMES DAILY FOR 10 DAYS. 20 tablet 0  . tamsulosin (FLOMAX) 0.4  MG CAPS capsule Take 0.4 mg by mouth at bedtime.    . tamsulosin (FLOMAX) 0.4 MG CAPS capsule TAKE 1 CAPSULE BY MOUTH ONCE DAILY AT BEDTIME. 30 capsule 1  . tamsulosin (FLOMAX) 0.4 MG CAPS capsule TAKE 1 CAPSULE BY MOUTH AT BEDTIME 30 capsule 3  . thiamine 100 MG tablet Take 1 tablet (100 mg total) by mouth daily. 30 tablet 0  . vitamin B-12 (CYANOCOBALAMIN) 1000 MCG tablet Take 2,000 mcg by mouth every evening.     . Vitamin E 180 MG (  400 UNIT) CAPS Take 400 Units by mouth every evening.      No current facility-administered medications for this visit.     Allergies:  Allergies  Allergen Reactions  . Metformin And Related Other (See Comments)    Lactic acidosis   . Penicillins Hives and Rash    Reports hives to penicillin and amoxicillin as an adult "a long time ago." Has tolerated cefdinir (09/2020) and cefepime (10/2020) before.      Physical Exam:   Blood pressure (!) 160/84, pulse 80, temperature 97.8 F (36.6 C), temperature source Tympanic, resp. rate 17, height '5\' 6"'  (1.676 m), weight 191 lb 1.6 oz (86.7 kg), SpO2 97 %.     ECOG: 1     General appearance: Alert, awake without any distress. Head: Atraumatic without abnormalities Oropharynx: Without any thrush or ulcers. Eyes: No scleral icterus. Lymph nodes: No lymphadenopathy noted in Mary cervical, supraclavicular, or axillary nodes Heart:regular rate and rhythm, without any murmurs or gallops.   Lung: Clear to auscultation without any rhonchi, wheezes or dullness to percussion. Abdomin: Soft, nontender without any shifting dullness or ascites. Musculoskeletal: No clubbing or cyanosis. Neurological: No motor or sensory deficits. Skin: No rashes or lesions.          Lab Results: Lab Results  Component Value Date   WBC 6.1 03/27/2021   HGB 11.0 (L) 03/27/2021   HCT 34.0 (L) 03/27/2021   MCV 102.7 (H) 03/27/2021   PLT 187 03/27/2021     Chemistry      Component Value Date/Time   NA 136  03/27/2021 1424   NA 142 04/26/2018 1333   K 4.8 03/27/2021 1424   CL 106 03/27/2021 1424   CO2 19 (L) 03/27/2021 1424   BUN 30 (H) 03/27/2021 1424   BUN 22 04/26/2018 1333   CREATININE 1.26 (H) 03/27/2021 1424   CREATININE 0.83 09/22/2020 1330   GLU 87 02/07/2013 0000      Component Value Date/Time   CALCIUM 9.1 03/27/2021 1424   ALKPHOS 93 03/27/2021 1424   AST 18 03/27/2021 1424   ALT 11 03/27/2021 1424   BILITOT <0.2 (L) 03/27/2021 1424       Impression and Plan:    Mary Stark with:  1.    Adenocarcinoma of Mary bladder and Mary GU tract diagnosed in November 2021.  She was found to have stage IV disease with pelvic adenopathy.  Next generation sequencing did not show any actionable mutation and PD-L1 score of 1.  CT scan obtained on 03/27/2021 was personally reviewed and continues to show increase in her retroperitoneal lymphadenopathy despite systemic therapy.  Treatment options moving forward were discussed at this time.  Different salvage therapy options including PD-L1 inhibitors with Pembrolizumab versus traditional systemic chemotherapy utilizing FOLFOX.  Risks and benefits of both approaches were discussed.  FOLFOX chemotherapy would be an option considering this could be a regnancy arising from Mary urachus..  Complication associated with this chemotherapy include nausea, vomiting, bowel suppression and Mary need for infusional pump.  After discussion today, she is agreeable to proceed with FOLFOX chemotherapy which we will schedule in Mary near future.  2. IV access:Port-A-Cath continues to be in use without any complications.  3. Antiemetics: No residual nausea or vomiting reported at this time.  4. Renal function surveillance: Creatinine clearance is around 50 cc/min and overall stable.  She does have a ureteral stents bilaterally and continues to follow with urology regarding this issue.  5. Goals of care:Therapy is palliative  lobe aggressive  measures are warranted given her reasonable performance status.   6.  Anemia: She is status posttransfusion with improvement in her hemoglobin up to 11.  7. Follow-up: We will be in Mary next few weeks to proceed with chemotherapy.  30  minutes were dedicated to this encounter.  Time was spent on reviewing disease status, reviewing imaging studies, discussing treatment options and complications related to therapy.   Zola Button, MD 4/18/20229:51 AM

## 2021-03-31 NOTE — Progress Notes (Signed)
DISCONTINUE ON PATHWAY REGIMEN - Bladder     A cycle is every 21 days:     Carboplatin      Gemcitabine   **Always confirm dose/schedule in your pharmacy ordering system**  REASON: Disease Progression PRIOR TREATMENT: [id number]: Carboplatin AUC=5 D1 + Gemcitabine 1,000 mg/m2 D1, 8 q21 Days for a Maximum of 6 Cycles TREATMENT RESPONSE: Partial Response (PR)  START OFF PATHWAY REGIMEN - Bladder   [id number] (Leucovorin IV D1 + Fluorouracil IV D1/CIV D1,2 + Oxaliplatin IV D1) q14 Days:   A cycle is every 14 days:     Oxaliplatin      Leucovorin      Fluorouracil      Fluorouracil   **Always confirm dose/schedule in your pharmacy ordering system**  Patient Characteristics: Advanced/Metastatic Disease, Second Line, FGFR2/FGFR3 Mutation Negative or Unknown, Prior Platinum-Based Therapy and No Prior PD-1/PD-L1 Inhibitor Therapeutic Status: Advanced/Metastatic Disease Line of Therapy: Second Line FGFR2/FGFR3 Mutation Status: Negative Intent of Therapy: Non-Curative / Palliative Intent, Discussed with Patient

## 2021-04-02 ENCOUNTER — Other Ambulatory Visit (HOSPITAL_COMMUNITY): Payer: Self-pay

## 2021-04-02 ENCOUNTER — Other Ambulatory Visit: Payer: Self-pay | Admitting: Osteopathic Medicine

## 2021-04-02 MED ORDER — SIMVASTATIN 20 MG PO TABS
ORAL_TABLET | Freq: Every day | ORAL | 0 refills | Status: DC
  Filled 2021-04-02: qty 90, 90d supply, fill #0

## 2021-04-02 MED FILL — Gabapentin Cap 300 MG: ORAL | 30 days supply | Qty: 180 | Fill #0 | Status: AC

## 2021-04-08 NOTE — Progress Notes (Signed)
Pharmacist Chemotherapy Monitoring - Initial Assessment    Anticipated start date: 04/15/21  Regimen:  . Are orders appropriate based on the patient's diagnosis, regimen, and cycle? Yes . Does the plan date match the patient's scheduled date? Yes . Is the sequencing of drugs appropriate? Yes . Are the premedications appropriate for the patient's regimen? Yes . Prior Authorization for treatment is: Pending o If applicable, is the correct biosimilar selected based on the patient's insurance? not applicable  Organ Function and Labs: Marland Kitchen Are dose adjustments needed based on the patient's renal function, hepatic function, or hematologic function? Yes . Are appropriate labs ordered prior to the start of patient's treatment? Yes . Other organ system assessment, if indicated: N/A . The following baseline labs, if indicated, have been ordered: N/A  Dose Assessment: . Are the drug doses appropriate? Yes . Are the following correct: o Drug concentrations Yes o IV fluid compatible with drug Yes o Administration routes Yes o Timing of therapy Yes . If applicable, does the patient have documented access for treatment and/or plans for port-a-cath placement? yes . If applicable, have lifetime cumulative doses been properly documented and assessed? yes Lifetime Dose Tracking  . Carboplatin: 2,090 mg = 0.01 % of the maximum lifetime dose of 999,999,999 mg  o   Toxicity Monitoring/Prevention: . The patient has the following take home antiemetics prescribed: Prochlorperazine . The patient has the following take home medications prescribed: N/A . Medication allergies and previous infusion related reactions, if applicable, have been reviewed and addressed. Yes . The patient's current medication list has been assessed for drug-drug interactions with their chemotherapy regimen. no significant drug-drug interactions were identified on review.  Order Review: . Are the treatment plan orders signed? Yes . Is  the patient scheduled to see a provider prior to their treatment? No  I verify that I have reviewed each item in the above checklist and answered each question accordingly.  Philomena Course, [place], 04/08/2021  11:14 AM

## 2021-04-15 ENCOUNTER — Inpatient Hospital Stay: Payer: No Typology Code available for payment source | Attending: Oncology

## 2021-04-15 ENCOUNTER — Inpatient Hospital Stay: Payer: No Typology Code available for payment source

## 2021-04-15 ENCOUNTER — Other Ambulatory Visit: Payer: Self-pay

## 2021-04-15 VITALS — BP 147/73 | HR 60 | Temp 98.3°F | Resp 16 | Wt 188.8 lb

## 2021-04-15 DIAGNOSIS — C669 Malignant neoplasm of unspecified ureter: Secondary | ICD-10-CM | POA: Diagnosis present

## 2021-04-15 DIAGNOSIS — D630 Anemia in neoplastic disease: Secondary | ICD-10-CM | POA: Diagnosis not present

## 2021-04-15 DIAGNOSIS — Z95828 Presence of other vascular implants and grafts: Secondary | ICD-10-CM

## 2021-04-15 DIAGNOSIS — Z5111 Encounter for antineoplastic chemotherapy: Secondary | ICD-10-CM | POA: Insufficient documentation

## 2021-04-15 DIAGNOSIS — C801 Malignant (primary) neoplasm, unspecified: Secondary | ICD-10-CM

## 2021-04-15 LAB — CMP (CANCER CENTER ONLY)
ALT: 8 U/L (ref 0–44)
AST: 16 U/L (ref 15–41)
Albumin: 3.6 g/dL (ref 3.5–5.0)
Alkaline Phosphatase: 77 U/L (ref 38–126)
Anion gap: 8 (ref 5–15)
BUN: 22 mg/dL (ref 8–23)
CO2: 24 mmol/L (ref 22–32)
Calcium: 9 mg/dL (ref 8.9–10.3)
Chloride: 105 mmol/L (ref 98–111)
Creatinine: 1.39 mg/dL — ABNORMAL HIGH (ref 0.44–1.00)
GFR, Estimated: 43 mL/min — ABNORMAL LOW (ref >60)
Glucose, Bld: 114 mg/dL — ABNORMAL HIGH (ref 70–99)
Potassium: 5 mmol/L (ref 3.5–5.1)
Sodium: 137 mmol/L (ref 135–145)
Total Bilirubin: <0.2 mg/dL — ABNORMAL LOW (ref 0.3–1.2)
Total Protein: 7.8 g/dL (ref 6.5–8.1)

## 2021-04-15 LAB — CBC WITH DIFFERENTIAL (CANCER CENTER ONLY)
Abs Immature Granulocytes: 0.02 10*3/uL (ref 0.00–0.07)
Basophils Absolute: 0.1 10*3/uL (ref 0.0–0.1)
Basophils Relative: 1 %
Eosinophils Absolute: 0.2 10*3/uL (ref 0.0–0.5)
Eosinophils Relative: 4 %
HCT: 32.1 % — ABNORMAL LOW (ref 36.0–46.0)
Hemoglobin: 10.2 g/dL — ABNORMAL LOW (ref 12.0–15.0)
Immature Granulocytes: 0 %
Lymphocytes Relative: 31 %
Lymphs Abs: 2 10*3/uL (ref 0.7–4.0)
MCH: 32.7 pg (ref 26.0–34.0)
MCHC: 31.8 g/dL (ref 30.0–36.0)
MCV: 102.9 fL — ABNORMAL HIGH (ref 80.0–100.0)
Monocytes Absolute: 0.8 10*3/uL (ref 0.1–1.0)
Monocytes Relative: 12 %
Neutro Abs: 3.4 10*3/uL (ref 1.7–7.7)
Neutrophils Relative %: 52 %
Platelet Count: 195 10*3/uL (ref 150–400)
RBC: 3.12 MIL/uL — ABNORMAL LOW (ref 3.87–5.11)
RDW: 17.6 % — ABNORMAL HIGH (ref 11.5–15.5)
WBC Count: 6.5 10*3/uL (ref 4.0–10.5)
nRBC: 0 % (ref 0.0–0.2)

## 2021-04-15 LAB — SAMPLE TO BLOOD BANK

## 2021-04-15 MED ORDER — PALONOSETRON HCL INJECTION 0.25 MG/5ML
INTRAVENOUS | Status: AC
  Filled 2021-04-15: qty 5

## 2021-04-15 MED ORDER — SODIUM CHLORIDE 0.9 % IV SOLN
10.0000 mg | Freq: Once | INTRAVENOUS | Status: AC
  Administered 2021-04-15: 10 mg via INTRAVENOUS
  Filled 2021-04-15: qty 10

## 2021-04-15 MED ORDER — OXALIPLATIN CHEMO INJECTION 100 MG/20ML
85.0000 mg/m2 | Freq: Once | INTRAVENOUS | Status: AC
  Administered 2021-04-15: 170 mg via INTRAVENOUS
  Filled 2021-04-15: qty 34

## 2021-04-15 MED ORDER — SODIUM CHLORIDE 0.9 % IV SOLN
2400.0000 mg/m2 | INTRAVENOUS | Status: DC
  Administered 2021-04-15: 4800 mg via INTRAVENOUS
  Filled 2021-04-15: qty 96

## 2021-04-15 MED ORDER — PALONOSETRON HCL INJECTION 0.25 MG/5ML
0.2500 mg | Freq: Once | INTRAVENOUS | Status: AC
  Administered 2021-04-15: 0.25 mg via INTRAVENOUS

## 2021-04-15 MED ORDER — DEXTROSE 5 % IV SOLN
Freq: Once | INTRAVENOUS | Status: AC
  Filled 2021-04-15: qty 250

## 2021-04-15 MED ORDER — SODIUM CHLORIDE 0.9% FLUSH
10.0000 mL | Freq: Once | INTRAVENOUS | Status: DC
  Filled 2021-04-15: qty 10

## 2021-04-15 MED ORDER — LEUCOVORIN CALCIUM INJECTION 350 MG
400.0000 mg/m2 | Freq: Once | INTRAVENOUS | Status: AC
  Administered 2021-04-15: 804 mg via INTRAVENOUS
  Filled 2021-04-15: qty 40.2

## 2021-04-15 MED ORDER — FLUOROURACIL CHEMO INJECTION 2.5 GM/50ML
400.0000 mg/m2 | Freq: Once | INTRAVENOUS | Status: AC
  Administered 2021-04-15: 800 mg via INTRAVENOUS
  Filled 2021-04-15: qty 16

## 2021-04-15 NOTE — Patient Instructions (Signed)
[facility]  Discharge Instructions: Thank you for choosing [facility] to provide your oncology and hematology care.   If you have a lab appointment with the [facility], please go directly to the [facility] and check in at the registration area.   Wear comfortable clothing and clothing appropriate for easy access to any Portacath or PICC line.   We strive to give you quality time with your provider. You may need to reschedule your appointment if you arrive late (15 or more minutes).  Arriving late affects you and other patients whose appointments are after yours.  Also, if you miss three or more appointments without notifying the office, you may be dismissed from the clinic at the provider's discretion.      For prescription refill requests, have your pharmacy contact our office and allow 72 hours for refills to be completed.    Today you received the following chemotherapy and/or immunotherapy agents oxaliplatin, leucovorin, fluoruracil   To help prevent nausea and vomiting after your treatment, we encourage you to take your nausea medication as directed.  BELOW ARE SYMPTOMS THAT SHOULD BE REPORTED IMMEDIATELY: . *FEVER GREATER THAN 100.4 F (38 C) OR HIGHER . *CHILLS OR SWEATING . *NAUSEA AND VOMITING THAT IS NOT CONTROLLED WITH YOUR NAUSEA MEDICATION . *UNUSUAL SHORTNESS OF BREATH . *UNUSUAL BRUISING OR BLEEDING . *URINARY PROBLEMS (pain or burning when urinating, or frequent urination) . *BOWEL PROBLEMS (unusual diarrhea, constipation, pain near the anus) . TENDERNESS IN MOUTH AND THROAT WITH OR WITHOUT PRESENCE OF ULCERS (sore throat, sores in mouth, or a toothache) . UNUSUAL RASH, SWELLING OR PAIN  . UNUSUAL VAGINAL DISCHARGE OR ITCHING   Items with * indicate a potential emergency and should be followed up as soon as possible or go to the Emergency Department if any problems should occur.  Please show the CHEMOTHERAPY ALERT CARD  or IMMUNOTHERAPY ALERT CARD at check-in to the Emergency Department and triage nurse.  Should you have questions after your visit or need to cancel or reschedule your appointment, please contact [facility]  Dept: [phone]  and follow the prompts.  Office hours are 8:00 a.m. to 4:30 p.m. Monday - Friday. Please note that voicemails left after 4:00 p.m. may not be returned until the following business day.  We are closed weekends and major holidays. You have access to a nurse at all times for urgent questions. Please call the main number to the clinic Dept: [phone] and follow the prompts.   For any non-urgent questions, you may also contact your provider using MyChart. We now offer e-Visits for anyone 2 and older to request care online for non-urgent symptoms. For details visit [email or web address].   Also download the MyChart app! Go to the app store, search "MyChart", open the app, select [facility], and log in with your MyChart username and password.  Due to Covid, a mask is required upon entering the hospital/clinic. If you do not have a mask, one will be given to you upon arrival. For doctor visits, patients may have 1 support person aged 72 or older with them. For treatment visits, patients cannot have anyone with them due to current Covid guidelines and our immunocompromised population.

## 2021-04-16 ENCOUNTER — Other Ambulatory Visit: Payer: Self-pay | Admitting: Osteopathic Medicine

## 2021-04-16 ENCOUNTER — Telehealth: Payer: Self-pay | Admitting: *Deleted

## 2021-04-16 ENCOUNTER — Other Ambulatory Visit (HOSPITAL_COMMUNITY): Payer: Self-pay

## 2021-04-16 ENCOUNTER — Telehealth: Payer: Self-pay | Admitting: Oncology

## 2021-04-16 ENCOUNTER — Other Ambulatory Visit: Payer: Self-pay

## 2021-04-16 DIAGNOSIS — I10 Essential (primary) hypertension: Secondary | ICD-10-CM

## 2021-04-16 MED ORDER — NEBIVOLOL HCL 20 MG PO TABS
1.0000 | ORAL_TABLET | Freq: Every day | ORAL | 2 refills | Status: DC
  Filled 2021-04-16: qty 90, 90d supply, fill #0

## 2021-04-16 NOTE — Telephone Encounter (Signed)
Updated appt time per 5/3 sch msg. Called pt, no answer. Left msg with appt time.

## 2021-04-17 ENCOUNTER — Other Ambulatory Visit: Payer: Self-pay | Admitting: Urology

## 2021-04-17 ENCOUNTER — Ambulatory Visit (INDEPENDENT_AMBULATORY_CARE_PROVIDER_SITE_OTHER): Payer: No Typology Code available for payment source | Admitting: Sports Medicine

## 2021-04-17 ENCOUNTER — Ambulatory Visit (INDEPENDENT_AMBULATORY_CARE_PROVIDER_SITE_OTHER): Payer: No Typology Code available for payment source

## 2021-04-17 ENCOUNTER — Other Ambulatory Visit: Payer: Self-pay

## 2021-04-17 ENCOUNTER — Inpatient Hospital Stay: Payer: No Typology Code available for payment source

## 2021-04-17 VITALS — BP 119/77 | HR 88 | Temp 97.7°F | Resp 19

## 2021-04-17 DIAGNOSIS — M170 Bilateral primary osteoarthritis of knee: Secondary | ICD-10-CM

## 2021-04-17 DIAGNOSIS — C801 Malignant (primary) neoplasm, unspecified: Secondary | ICD-10-CM

## 2021-04-17 DIAGNOSIS — Z5111 Encounter for antineoplastic chemotherapy: Secondary | ICD-10-CM | POA: Diagnosis not present

## 2021-04-17 DIAGNOSIS — C669 Malignant neoplasm of unspecified ureter: Secondary | ICD-10-CM

## 2021-04-17 MED ORDER — HEPARIN SOD (PORK) LOCK FLUSH 100 UNIT/ML IV SOLN
500.0000 [IU] | Freq: Once | INTRAVENOUS | Status: AC | PRN
  Administered 2021-04-17: 500 [IU]
  Filled 2021-04-17: qty 5

## 2021-04-17 MED ORDER — SODIUM CHLORIDE 0.9% FLUSH
10.0000 mL | INTRAVENOUS | Status: DC | PRN
  Administered 2021-04-17: 10 mL
  Filled 2021-04-17: qty 10

## 2021-04-17 NOTE — Assessment & Plan Note (Signed)
Bilateral knee osteoarthritis, left knee did really well with an injection back in January, recurrence of pain bilaterally, bilateral knee injections today. Return in a month. Viscosupplementation if no better.

## 2021-04-17 NOTE — Progress Notes (Signed)
    Procedures performed today:    Procedure: Real-time Ultrasound Guided injection of the left knee Device: Samsung HS60  Verbal informed consent obtained.  Time-out conducted.  Noted no overlying erythema, induration, or other signs of local infection.  Skin prepped in a sterile fashion.  Local anesthesia: Topical Ethyl chloride.  With sterile technique and under real time ultrasound guidance:  Noted trace effusion, 1 cc Kenalog 40, 2 cc lidocaine, 2 cc bupivacaine injected easily Completed without difficulty  Advised to call if fevers/chills, erythema, induration, drainage, or persistent bleeding.  Images permanently stored and available for review in PACS.  Impression: Technically successful ultrasound guided injection.  Procedure: Real-time Ultrasound Guided injection of the right knee Device: Samsung HS60  Verbal informed consent obtained.  Time-out conducted.  Noted no overlying erythema, induration, or other signs of local infection.  Skin prepped in a sterile fashion.  Local anesthesia: Topical Ethyl chloride.  With sterile technique and under real time ultrasound guidance:  Noted trace effusion, 1 cc Kenalog 40, 2 cc lidocaine, 2 cc bupivacaine injected easily Completed without difficulty  Advised to call if fevers/chills, erythema, induration, drainage, or persistent bleeding.  Images permanently stored and available for review in PACS.  Impression: Technically successful ultrasound guided injection.  Independent interpretation of notes and tests performed by another provider:   None.  Brief History, Exam, Impression, and Recommendations:    Primary osteoarthritis of both knees Bilateral knee osteoarthritis, left knee did really well with an injection back in January, recurrence of pain bilaterally, bilateral knee injections today. Return in a month. Viscosupplementation if no better.    ___________________________________________ Gwen Her. Dianah Field, M.D.,  ABFM., CAQSM. Primary Care and [place] Instructor of [place] [employer] [facility]

## 2021-04-18 ENCOUNTER — Telehealth: Payer: Self-pay | Admitting: Osteopathic Medicine

## 2021-04-18 ENCOUNTER — Other Ambulatory Visit: Payer: Self-pay | Admitting: Osteopathic Medicine

## 2021-04-18 ENCOUNTER — Other Ambulatory Visit (HOSPITAL_COMMUNITY): Payer: Self-pay

## 2021-04-18 DIAGNOSIS — N1330 Unspecified hydronephrosis: Secondary | ICD-10-CM

## 2021-04-18 NOTE — Telephone Encounter (Signed)
Patient called and urology referral is getting ready to expire on 5/13 she will need a new referral placed due to insurance patient has [facility].   [facility] Stark

## 2021-04-19 ENCOUNTER — Other Ambulatory Visit (HOSPITAL_COMMUNITY): Payer: Self-pay

## 2021-04-19 ENCOUNTER — Other Ambulatory Visit: Payer: Self-pay

## 2021-04-19 ENCOUNTER — Other Ambulatory Visit: Payer: Self-pay | Admitting: Osteopathic Medicine

## 2021-04-21 ENCOUNTER — Other Ambulatory Visit (HOSPITAL_COMMUNITY): Payer: Self-pay

## 2021-04-21 MED ORDER — MIRABEGRON ER 25 MG PO TB24
ORAL_TABLET | Freq: Every day | ORAL | 3 refills | Status: DC
  Filled 2021-04-21: qty 30, 30d supply, fill #0
  Filled 2021-06-02: qty 30, 30d supply, fill #1
  Filled 2021-07-03: qty 30, 30d supply, fill #2
  Filled 2021-08-13: qty 30, 30d supply, fill #3

## 2021-04-21 MED ORDER — BUPROPION HCL ER (XL) 300 MG PO TB24
ORAL_TABLET | Freq: Every day | ORAL | 3 refills | Status: DC
  Filled 2021-04-21: qty 30, 30d supply, fill #0
  Filled 2021-05-19: qty 30, 30d supply, fill #1
  Filled 2021-06-23: qty 30, 30d supply, fill #2
  Filled 2021-07-22: qty 30, 30d supply, fill #3

## 2021-04-21 MED ORDER — TAMSULOSIN HCL 0.4 MG PO CAPS
ORAL_CAPSULE | Freq: Every day | ORAL | 3 refills | Status: DC
  Filled 2021-04-21: qty 30, 30d supply, fill #0
  Filled 2021-06-02: qty 30, 30d supply, fill #1
  Filled 2021-07-03: qty 30, 30d supply, fill #2
  Filled 2021-08-13: qty 30, 30d supply, fill #3

## 2021-04-21 MED ORDER — CLONAZEPAM 0.5 MG PO TABS
ORAL_TABLET | Freq: Every day | ORAL | 0 refills | Status: DC
  Filled 2021-04-21: qty 30, 30d supply, fill #0

## 2021-04-21 NOTE — Telephone Encounter (Signed)
Routing to covering provider.  °

## 2021-04-21 NOTE — Telephone Encounter (Signed)
Task completed. New Urology referral placed.

## 2021-04-22 ENCOUNTER — Other Ambulatory Visit (HOSPITAL_COMMUNITY): Payer: Self-pay

## 2021-04-25 ENCOUNTER — Telehealth: Payer: Self-pay | Admitting: Oncology

## 2021-04-25 NOTE — Telephone Encounter (Signed)
Called patient regarding upcoming May appointments, patient has been called and notified.

## 2021-04-28 ENCOUNTER — Telehealth: Payer: Self-pay | Admitting: Osteopathic Medicine

## 2021-04-28 NOTE — Telephone Encounter (Signed)
Pt called. She needs another referral to  Dr. Claudia Desanctis with [facility]; the referral she currently has is about to run out.  Thank you.

## 2021-04-29 ENCOUNTER — Other Ambulatory Visit: Payer: No Typology Code available for payment source

## 2021-04-29 ENCOUNTER — Ambulatory Visit: Payer: No Typology Code available for payment source | Admitting: Oncology

## 2021-04-29 NOTE — Telephone Encounter (Signed)
A new referral was place for the patient with [facility] early last week. Please contact and update the patient. Thanks in advance.

## 2021-04-30 ENCOUNTER — Other Ambulatory Visit: Payer: Self-pay

## 2021-04-30 ENCOUNTER — Other Ambulatory Visit: Payer: No Typology Code available for payment source

## 2021-04-30 ENCOUNTER — Inpatient Hospital Stay (HOSPITAL_BASED_OUTPATIENT_CLINIC_OR_DEPARTMENT_OTHER): Payer: No Typology Code available for payment source | Admitting: Oncology

## 2021-04-30 ENCOUNTER — Inpatient Hospital Stay: Payer: No Typology Code available for payment source

## 2021-04-30 VITALS — BP 135/82 | HR 70 | Temp 97.5°F | Resp 18 | Wt 187.5 lb

## 2021-04-30 DIAGNOSIS — C669 Malignant neoplasm of unspecified ureter: Secondary | ICD-10-CM

## 2021-04-30 DIAGNOSIS — Z95828 Presence of other vascular implants and grafts: Secondary | ICD-10-CM

## 2021-04-30 DIAGNOSIS — C801 Malignant (primary) neoplasm, unspecified: Secondary | ICD-10-CM

## 2021-04-30 DIAGNOSIS — Z5111 Encounter for antineoplastic chemotherapy: Secondary | ICD-10-CM | POA: Diagnosis not present

## 2021-04-30 LAB — CMP (CANCER CENTER ONLY)
ALT: 10 U/L (ref 0–44)
AST: 19 U/L (ref 15–41)
Albumin: 3.4 g/dL — ABNORMAL LOW (ref 3.5–5.0)
Alkaline Phosphatase: 73 U/L (ref 38–126)
Anion gap: 9 (ref 5–15)
BUN: 19 mg/dL (ref 8–23)
CO2: 23 mmol/L (ref 22–32)
Calcium: 8.8 mg/dL — ABNORMAL LOW (ref 8.9–10.3)
Chloride: 106 mmol/L (ref 98–111)
Creatinine: 1.26 mg/dL — ABNORMAL HIGH (ref 0.44–1.00)
GFR, Estimated: 48 mL/min — ABNORMAL LOW (ref >60)
Glucose, Bld: 86 mg/dL (ref 70–99)
Potassium: 3.9 mmol/L (ref 3.5–5.1)
Sodium: 138 mmol/L (ref 135–145)
Total Bilirubin: <0.2 mg/dL — ABNORMAL LOW (ref 0.3–1.2)
Total Protein: 7.3 g/dL (ref 6.5–8.1)

## 2021-04-30 LAB — CBC WITH DIFFERENTIAL (CANCER CENTER ONLY)
Abs Immature Granulocytes: 0.01 10*3/uL (ref 0.00–0.07)
Basophils Absolute: 0 10*3/uL (ref 0.0–0.1)
Basophils Relative: 1 %
Eosinophils Absolute: 0.2 10*3/uL (ref 0.0–0.5)
Eosinophils Relative: 4 %
HCT: 29 % — ABNORMAL LOW (ref 36.0–46.0)
Hemoglobin: 9.2 g/dL — ABNORMAL LOW (ref 12.0–15.0)
Immature Granulocytes: 0 %
Lymphocytes Relative: 34 %
Lymphs Abs: 1.5 10*3/uL (ref 0.7–4.0)
MCH: 33 pg (ref 26.0–34.0)
MCHC: 31.7 g/dL (ref 30.0–36.0)
MCV: 103.9 fL — ABNORMAL HIGH (ref 80.0–100.0)
Monocytes Absolute: 0.7 10*3/uL (ref 0.1–1.0)
Monocytes Relative: 17 %
Neutro Abs: 1.9 10*3/uL (ref 1.7–7.7)
Neutrophils Relative %: 44 %
Platelet Count: 150 10*3/uL (ref 150–400)
RBC: 2.79 MIL/uL — ABNORMAL LOW (ref 3.87–5.11)
RDW: 17.1 % — ABNORMAL HIGH (ref 11.5–15.5)
WBC Count: 4.4 10*3/uL (ref 4.0–10.5)
nRBC: 0 % (ref 0.0–0.2)

## 2021-04-30 LAB — SAMPLE TO BLOOD BANK

## 2021-04-30 MED ORDER — SODIUM CHLORIDE 0.9 % IV SOLN
10.0000 mg | Freq: Once | INTRAVENOUS | Status: AC
  Administered 2021-04-30: 10 mg via INTRAVENOUS
  Filled 2021-04-30: qty 10

## 2021-04-30 MED ORDER — FLUOROURACIL CHEMO INJECTION 2.5 GM/50ML
400.0000 mg/m2 | Freq: Once | INTRAVENOUS | Status: AC
  Administered 2021-04-30: 800 mg via INTRAVENOUS
  Filled 2021-04-30: qty 16

## 2021-04-30 MED ORDER — PALONOSETRON HCL INJECTION 0.25 MG/5ML
0.2500 mg | Freq: Once | INTRAVENOUS | Status: AC
  Administered 2021-04-30: 0.25 mg via INTRAVENOUS

## 2021-04-30 MED ORDER — PALONOSETRON HCL INJECTION 0.25 MG/5ML
INTRAVENOUS | Status: AC
  Filled 2021-04-30: qty 5

## 2021-04-30 MED ORDER — OXALIPLATIN CHEMO INJECTION 100 MG/20ML
85.0000 mg/m2 | Freq: Once | INTRAVENOUS | Status: AC
  Administered 2021-04-30: 170 mg via INTRAVENOUS
  Filled 2021-04-30: qty 34

## 2021-04-30 MED ORDER — LEUCOVORIN CALCIUM INJECTION 350 MG
400.0000 mg/m2 | Freq: Once | INTRAVENOUS | Status: AC
  Administered 2021-04-30: 804 mg via INTRAVENOUS
  Filled 2021-04-30: qty 40.2

## 2021-04-30 MED ORDER — SODIUM CHLORIDE 0.9% FLUSH
10.0000 mL | Freq: Once | INTRAVENOUS | Status: AC
  Administered 2021-04-30: 10 mL
  Filled 2021-04-30: qty 10

## 2021-04-30 MED ORDER — DEXTROSE 5 % IV SOLN
Freq: Once | INTRAVENOUS | Status: AC
Start: 2021-04-30 — End: 2021-04-30
  Filled 2021-04-30: qty 250

## 2021-04-30 MED ORDER — ONDANSETRON HCL 4 MG PO TABS: 4.0000 mg | ORAL_TABLET | Freq: Three times a day (TID) | ORAL | 3 refills | Status: AC | PRN

## 2021-04-30 MED ORDER — SODIUM CHLORIDE 0.9 % IV SOLN
2400.0000 mg/m2 | INTRAVENOUS | Status: DC
  Administered 2021-04-30: 4800 mg via INTRAVENOUS
  Filled 2021-04-30: qty 96

## 2021-04-30 NOTE — Patient Instructions (Signed)
[facility]  Discharge Instructions: Thank you for choosing [facility] to provide your oncology and hematology care.   If you have a lab appointment with the [facility], please go directly to the [facility] and check in at the registration area.   Wear comfortable clothing and clothing appropriate for easy access to any Portacath or PICC line.   We strive to give you quality time with your provider. You may need to reschedule your appointment if you arrive late (15 or more minutes).  Arriving late affects you and other patients whose appointments are after yours.  Also, if you miss three or more appointments without notifying the office, you may be dismissed from the clinic at the provider's discretion.      For prescription refill requests, have your pharmacy contact our office and allow 72 hours for refills to be completed.    Today you received the following chemotherapy and/or immunotherapy agents fluorourcil, oxaliplatin, leucovorin    To help prevent nausea and vomiting after your treatment, we encourage you to take your nausea medication as directed.  BELOW ARE SYMPTOMS THAT SHOULD BE REPORTED IMMEDIATELY: . *FEVER GREATER THAN 100.4 F (38 C) OR HIGHER . *CHILLS OR SWEATING . *NAUSEA AND VOMITING THAT IS NOT CONTROLLED WITH YOUR NAUSEA MEDICATION . *UNUSUAL SHORTNESS OF BREATH . *UNUSUAL BRUISING OR BLEEDING . *URINARY PROBLEMS (pain or burning when urinating, or frequent urination) . *BOWEL PROBLEMS (unusual diarrhea, constipation, pain near the anus) . TENDERNESS IN MOUTH AND THROAT WITH OR WITHOUT PRESENCE OF ULCERS (sore throat, sores in mouth, or a toothache) . UNUSUAL RASH, SWELLING OR PAIN  . UNUSUAL VAGINAL DISCHARGE OR ITCHING   Items with * indicate a potential emergency and should be followed up as soon as possible or go to the Emergency Department if any problems should occur.  Please show the CHEMOTHERAPY ALERT CARD  or IMMUNOTHERAPY ALERT CARD at check-in to the Emergency Department and triage nurse.  Should you have questions after your visit or need to cancel or reschedule your appointment, please contact [facility]  Dept: [phone]  and follow the prompts.  Office hours are 8:00 a.m. to 4:30 p.m. Monday - Friday. Please note that voicemails left after 4:00 p.m. may not be returned until the following business day.  We are closed weekends and major holidays. You have access to a nurse at all times for urgent questions. Please call the main number to the clinic Dept: [phone] and follow the prompts.   For any non-urgent questions, you may also contact your provider using MyChart. We now offer e-Visits for anyone 42 and older to request care online for non-urgent symptoms. For details visit [email or web address].   Also download the MyChart app! Go to the app store, search "MyChart", open the app, select [facility], and log in with your MyChart username and password.  Due to Covid, a mask is required upon entering the hospital/clinic. If you do not have a mask, one will be given to you upon arrival. For doctor visits, patients may have 1 support person aged 46 or older with them. For treatment visits, patients cannot have anyone with them due to current Covid guidelines and our immunocompromised population.

## 2021-04-30 NOTE — Progress Notes (Signed)
Hematology and Oncology Follow Up Visit  [name] [id number] July 10, 1956 65 y.o. 04/30/2021 10:38 AM Mary Stark, DOAlexander, Foster, DO   [name]: 65 year old woman with GU malignancy presented with retroperitoneal adenopathy diagnosed in November 2021.  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  Next generation sequencing did not show any actionable mutation and PD-L1 score of 1.   Prior Therapy:  She is status post retroperitoneal lymph node biopsy completed on 10/24/2020 which showed metastatic carcinoma.  She is status post repeat cystoscopy and bilateral ureteral stent exchange as well as resection of a bladder tumor on January 21, 2021.  A final pathology showed an adenocarcinoma.  Chemotherapy utilizing carboplatin and gemcitabine started on November 22, 2020.  She completed 6 cycles of therapy on March 06, 2021.  Current therapy: FOLFOX chemotherapy started on Apr 15, 2021.  He is here for cycle 2 of therapy.   Interim History: Mary Stark returns today for repeat evaluation.  Since her last visit, she reports no major changes in her health.  She tolerated first cycle of FOLFOX chemotherapy without any major complaints.  She did have 3 episodes of nausea but no vomiting.  She is eating reasonably well without any decline ability to do so.  She does report fatigue and tiredness but no worsening neuropathy.     Medications: Unchanged on review. Current Outpatient Medications  Medication Sig Dispense Refill  . buPROPion (WELLBUTRIN XL) 300 MG 24 hr tablet TAKE 1 TABLET BY MOUTH EVERY DAY 30 tablet 3  . acetaminophen (TYLENOL) 500 MG tablet Take 1,000 mg by mouth every 8 (eight) hours as needed for moderate pain.     Marland Kitchen albuterol (PROAIR HFA) 108 (90 Base) MCG/ACT inhaler INHALE 1 TO 2 PUFFS BY MOUTH INTO THE LUNGS EVERY 4 HOURS AS NEEDED FOR WHEEZING OR SHORTNESS OF BREATH (Patient taking differently: Inhale 1-2 puffs into  the lungs every 4 (four) hours as needed for wheezing or shortness of breath. INHALE 1 TO 2 PUFFS BY MOUTH INTO THE LUNGS EVERY 4 HOURS AS NEEDED FOR WHEEZING OR SHORTNESS OF BREATH) 8.5 g 1  . amLODipine (NORVASC) 5 MG tablet TAKE 1 TABLET BY MOUTH ONCE A DAY (NEEDS OFFICE VISIT) 90 tablet 0  . ARIPiprazole (ABILIFY) 10 MG tablet TAKE 1 TABLET BY MOUTH ONCE DAILY 90 tablet 0  . ARIPiprazole (ABILIFY) 5 MG tablet TAKE 1 TABLET BY MOUTH ONCE A DAY 30 tablet 3  . ARIPiprazole 10 MG TABS Take 10 mg by mouth daily. (Patient taking differently: Take 10 mg by mouth at bedtime. abilify) 90 tablet 1  . ascorbic acid (VITAMIN C) 500 MG tablet Take 1,000 mg by mouth every evening.     Marland Kitchen aspirin 81 MG EC tablet TAKE 1 TABLET BY MOUTH DAILY. (Patient taking differently: Take 81 mg by mouth daily.) 90 tablet 1  . buPROPion (WELLBUTRIN XL) 300 MG 24 hr tablet Take 1 tablet (300 mg total) by mouth daily. 90 tablet 1  . buPROPion (WELLBUTRIN XL) 300 MG 24 hr tablet TAKE 1 TABLET BY MOUTH EVERY DAY 30 tablet 3  . cephALEXin (KEFLEX) 500 MG capsule TAKE 1 CAPSULE (500 MG TOTAL) BY MOUTH EVERY 8 (EIGHT) HOURS FOR 7 DAYS. 21 capsule 0  . cephALEXin (KEFLEX) 500 MG capsule TAKE 1 CAPSULE BY MOUTH 2 TIMES DAILY 14 capsule 0  . Cholecalciferol (VITAMIN D) 125 MCG (5000 UT) CAPS Take 5,000 Units by mouth every evening.     . clonazePAM (  KLONOPIN) 0.5 MG tablet TAKE 1 TABLET BY MOUTH AT BEDTIME 30 tablet 0  . Coenzyme Q10 (CO Q-10) 200 MG CAPS Take 200 mg by mouth every evening.     . DULoxetine (CYMBALTA) 30 MG capsule TAKE 1 CAPSULE BY MOUTH ONCE DAILY. 30 capsule 3  . fexofenadine (ALLEGRA) 180 MG tablet Take 180 mg by mouth every evening.     . furosemide (LASIX) 20 MG tablet TAKE 1 TABLET BY MOUTH ONCE A DAY 90 tablet 3  . gabapentin (NEURONTIN) 300 MG capsule 1  qam   4   qhs (Patient taking differently: Take 300-1,200 mg by mouth See admin instructions. Take 300 mg by mouth in the morning and afternoon and 1200 mg at  night) 150 capsule 5  . gabapentin (NEURONTIN) 300 MG capsule TAKE 1 CAPSULE BY MOUTH IN THE MORNING, 1 CAPSULE IN THE AFTERNOON & 4 CAPSULES AT NIGHT 180 capsule 3  . gabapentin (NEURONTIN) 300 MG capsule TAKE 1 CAPSULE BY MOUTH IN THE MORNING, 1 CAP IN THE AFTERNOON & 4 CAPS AT NIGHT 180 capsule 3  . GLUCOSAMINE-CHONDROITIN PO Take 2 tablets by mouth every evening.     Marland Kitchen guaiFENesin-dextromethorphan (ROBITUSSIN DM) 100-10 MG/5ML syrup Take 10 mLs by mouth every 4 (four) hours as needed for cough. 473 mL 0  . lidocaine-prilocaine (EMLA) cream Apply 1 application topically as needed. 30 g 0  . Meth-Hyo-M Bl-Na Phos-Ph Sal (URO-MP) 118 MG CAPS TAKE 1 CAPSULE BY MOUTH 3 TIMES DAILY AS NEEDED WITH A FULL GLASS OF WATER 20 capsule 3  . mirabegron ER (MYRBETRIQ) 25 MG TB24 tablet TAKE 1 TABLET BY MOUTH ONCE A DAY 30 tablet 3  . MYRBETRIQ 25 MG TB24 tablet Take 25 mg by mouth daily.    . Nebivolol HCl 20 MG TABS TAKE 1 TABLET BY MOUTH DAILY. 90 tablet 2  . nitrofurantoin (MACRODANTIN) 100 MG capsule Take 1 capsule (100 mg total) by mouth at bedtime. 90 capsule 1  . Omega-3 Fatty Acids (FISH OIL) 1200 MG CAPS Take 1,200 mg by mouth every evening.     Marland Kitchen omeprazole (PRILOSEC) 20 MG capsule TAKE 1 CAPSULE BY MOUTH ONCE A DAY * NEEDS APPT FOR REFILLS 90 capsule 0  . ondansetron (ZOFRAN-ODT) 4 MG disintegrating tablet Take 1 tablet (4 mg total) by mouth every 8 (eight) hours as needed for nausea or vomiting. 12 tablet 0  . oxybutynin (DITROPAN) 5 MG tablet TAKE 1 TABLET BY MOUTH EVERY 8 HOURS AS NEEDED 90 tablet 0  . oxyCODONE-acetaminophen (PERCOCET) 10-325 MG tablet Take 1 tablet by mouth every 8 (eight) hours as needed for pain. 90 tablet 0  . simvastatin (ZOCOR) 20 MG tablet TAKE 1 TABLET (20 MG TOTAL) BY MOUTH DAILY. 90 tablet 0  . sodium bicarbonate 650 MG tablet TAKE 1 TABLET (650 MG TOTAL) BY MOUTH 2 (TWO) TIMES DAILY FOR 10 DAYS. 20 tablet 0  . tamsulosin (FLOMAX) 0.4 MG CAPS capsule Take 0.4 mg by  mouth at bedtime.    . tamsulosin (FLOMAX) 0.4 MG CAPS capsule TAKE 1 CAPSULE BY MOUTH ONCE DAILY AT BEDTIME. 30 capsule 1  . tamsulosin (FLOMAX) 0.4 MG CAPS capsule TAKE 1 CAPSULE BY MOUTH AT BEDTIME 30 capsule 3  . thiamine 100 MG tablet Take 1 tablet (100 mg total) by mouth daily. 30 tablet 0  . vitamin B-12 (CYANOCOBALAMIN) 1000 MCG tablet Take 2,000 mcg by mouth every evening.     . Vitamin E 180 MG (400 UNIT) CAPS Take  400 Units by mouth every evening.      No current facility-administered medications for this visit.     Allergies:  Allergies  Allergen Reactions  . Metformin And Related Other (See Comments)    Lactic acidosis   . Penicillins Hives and Rash    Reports hives to penicillin and amoxicillin as an adult "a long time ago." Has tolerated cefdinir (09/2020) and cefepime (10/2020) before.      Physical Exam:        ECOG: 1   General appearance: Comfortable appearing without any discomfort Head: Normocephalic without any trauma Oropharynx: Mucous membranes are moist and pink without any thrush or ulcers. Eyes: Pupils are equal and round reactive to light. Lymph nodes: No cervical, supraclavicular, inguinal or axillary lymphadenopathy.   Heart:regular rate and rhythm.  S1 and S2 without leg edema. Lung: Clear without any rhonchi or wheezes.  No dullness to percussion. Abdomin: Soft, nontender, nondistended with good bowel sounds.  No hepatosplenomegaly. Musculoskeletal: No joint deformity or effusion.  Full range of motion noted. Neurological: No deficits noted on motor, sensory and deep tendon reflex exam. Skin: No petechial rash or dryness.  Appeared moist.           Lab Results: Lab Results  Component Value Date   WBC 6.5 04/15/2021   HGB 10.2 (L) 04/15/2021   HCT 32.1 (L) 04/15/2021   MCV 102.9 (H) 04/15/2021   PLT 195 04/15/2021     Chemistry      Component Value Date/Time   NA 137 04/15/2021 1049   NA 142 04/26/2018 1333   K 5.0  04/15/2021 1049   CL 105 04/15/2021 1049   CO2 24 04/15/2021 1049   BUN 22 04/15/2021 1049   BUN 22 04/26/2018 1333   CREATININE 1.39 (H) 04/15/2021 1049   CREATININE 0.83 09/22/2020 1330   GLU 87 02/07/2013 0000      Component Value Date/Time   CALCIUM 9.0 04/15/2021 1049   ALKPHOS 77 04/15/2021 1049   AST 16 04/15/2021 1049   ALT 8 04/15/2021 1049   BILITOT <0.2 (L) 04/15/2021 1049       Impression and Plan:    65 year old woman with:  1.    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    She is currently on salvage therapy utilizing FOLFOX and here for cycle 2 of therapy.  Risks and benefits of continuing this treatment were reviewed.  Potential complications including nausea, vomiting and worsening neuropathy were reviewed.  She is agreeable to proceed at this time and the plan is to update her staging scans after cycle 4.  2. IV access:Port-A-Cath remains in place without any issues.  3. Antiemetics: She takes Zofran with improvement in her nausea and will refill this medication for her.  4. Renal function surveillance: Kidney function remained stable and will be monitored on platinum therapy.  5. Goals of care:Her disease is incurable although aggressive measures are warranted given her reasonable performance status.   6.  Anemia: She is status posttransfusion previously with hemoglobin relatively stable.  7. Follow-up: In 2 weeks for the next cycle of therapy.  30  minutes were spent on this visit.  The time was dedicated to reviewing laboratory data, disease status update and outlining future plan of care.   Zola Button, MD 5/18/202210:38 AM

## 2021-04-30 NOTE — Patient Instructions (Signed)
Implanted Port Insertion, Care After This sheet gives you information about how to care for yourself after your procedure. Your health care provider may also give you more specific instructions. If you have problems or questions, contact your health care provider. What can I expect after the procedure? After the procedure, it is common to have:  Discomfort at the port insertion site.  Bruising on the skin over the port. This should improve over 3-4 days. Follow these instructions at home: Port care  After your port is placed, you will get a manufacturer's information card. The card has information about your port. Keep this card with you at all times.  Take care of the port as told by your health care provider. Ask your health care provider if you or a family member can get training for taking care of the port at home. A home health care nurse may also take care of the port.  Make sure to remember what type of port you have. Incision care  Follow instructions from your health care provider about how to take care of your port insertion site. Make sure you: ? Wash your hands with soap and water before and after you change your bandage (dressing). If soap and water are not available, use hand sanitizer. ? Change your dressing as told by your health care provider. ? Leave stitches (sutures), skin glue, or adhesive strips in place. These skin closures may need to stay in place for 2 weeks or longer. If adhesive strip edges start to loosen and curl up, you may trim the loose edges. Do not remove adhesive strips completely unless your health care provider tells you to do that.  Check your port insertion site every day for signs of infection. Check for: ? Redness, swelling, or pain. ? Fluid or blood. ? Warmth. ? Pus or a bad smell.      Activity  Return to your normal activities as told by your health care provider. Ask your health care provider what activities are safe for you.  Do not  lift anything that is heavier than 10 lb (4.5 kg), or the limit that you are told, until your health care provider says that it is safe. General instructions  Take over-the-counter and prescription medicines only as told by your health care provider.  Do not take baths, swim, or use a hot tub until your health care provider approves. Ask your health care provider if you may take showers. You may only be allowed to take sponge baths.  Do not drive for 24 hours if you were given a sedative during your procedure.  Wear a medical alert bracelet in case of an emergency. This will tell any health care providers that you have a port.  Keep all follow-up visits as told by your health care provider. This is important. Contact a health care provider if:  You cannot flush your port with saline as directed, or you cannot draw blood from the port.  You have a fever or chills.  You have redness, swelling, or pain around your port insertion site.  You have fluid or blood coming from your port insertion site.  Your port insertion site feels warm to the touch.  You have pus or a bad smell coming from the port insertion site. Get help right away if:  You have chest pain or shortness of breath.  You have bleeding from your port that you cannot control. Summary  Take care of the port as told by your   health care provider. Keep the manufacturer's information card with you at all times.  Change your dressing as told by your health care provider.  Contact a health care provider if you have a fever or chills or if you have redness, swelling, or pain around your port insertion site.  Keep all follow-up visits as told by your health care provider. This information is not intended to replace advice given to you by your health care provider. Make sure you discuss any questions you have with your health care provider. Document Revised: 06/28/2018 Document Reviewed: 06/28/2018 Elsevier Patient Education   2021 [employer]  

## 2021-05-01 ENCOUNTER — Emergency Department (HOSPITAL_COMMUNITY): Payer: No Typology Code available for payment source

## 2021-05-01 ENCOUNTER — Encounter (HOSPITAL_COMMUNITY): Payer: Self-pay

## 2021-05-01 ENCOUNTER — Emergency Department (HOSPITAL_COMMUNITY)
Admission: EM | Admit: 2021-05-01 | Discharge: 2021-05-01 | Disposition: A | Discharge status: Home / Self Care | Payer: No Typology Code available for payment source | Attending: Emergency Medicine | Admitting: Emergency Medicine

## 2021-05-01 DIAGNOSIS — Z8554 Personal history of malignant neoplasm of ureter: Secondary | ICD-10-CM | POA: Diagnosis not present

## 2021-05-01 DIAGNOSIS — Z79899 Other long term (current) drug therapy: Secondary | ICD-10-CM | POA: Insufficient documentation

## 2021-05-01 DIAGNOSIS — Z85828 Personal history of other malignant neoplasm of skin: Secondary | ICD-10-CM | POA: Insufficient documentation

## 2021-05-01 DIAGNOSIS — R509 Fever, unspecified: Secondary | ICD-10-CM | POA: Diagnosis not present

## 2021-05-01 DIAGNOSIS — N289 Disorder of kidney and ureter, unspecified: Secondary | ICD-10-CM | POA: Diagnosis not present

## 2021-05-01 DIAGNOSIS — Z87891 Personal history of nicotine dependence: Secondary | ICD-10-CM | POA: Insufficient documentation

## 2021-05-01 DIAGNOSIS — Z20822 Contact with and (suspected) exposure to covid-19: Secondary | ICD-10-CM | POA: Insufficient documentation

## 2021-05-01 DIAGNOSIS — Z7982 Long term (current) use of aspirin: Secondary | ICD-10-CM | POA: Insufficient documentation

## 2021-05-01 DIAGNOSIS — Z8616 Personal history of COVID-19: Secondary | ICD-10-CM | POA: Insufficient documentation

## 2021-05-01 DIAGNOSIS — I10 Essential (primary) hypertension: Secondary | ICD-10-CM | POA: Insufficient documentation

## 2021-05-01 DIAGNOSIS — D539 Nutritional anemia, unspecified: Secondary | ICD-10-CM | POA: Diagnosis not present

## 2021-05-01 LAB — COMPREHENSIVE METABOLIC PANEL
ALT: 15 U/L (ref 0–44)
AST: 19 U/L (ref 15–41)
Albumin: 3.4 g/dL — ABNORMAL LOW (ref 3.5–5.0)
Alkaline Phosphatase: 63 U/L (ref 38–126)
Anion gap: 8 (ref 5–15)
BUN: 23 mg/dL (ref 8–23)
CO2: 21 mmol/L — ABNORMAL LOW (ref 22–32)
Calcium: 8.4 mg/dL — ABNORMAL LOW (ref 8.9–10.3)
Chloride: 105 mmol/L (ref 98–111)
Creatinine, Ser: 1.34 mg/dL — ABNORMAL HIGH (ref 0.44–1.00)
GFR, Estimated: 45 mL/min — ABNORMAL LOW (ref >60)
Glucose, Bld: 112 mg/dL — ABNORMAL HIGH (ref 70–99)
Potassium: 3.6 mmol/L (ref 3.5–5.1)
Sodium: 134 mmol/L — ABNORMAL LOW (ref 135–145)
Total Bilirubin: 0.4 mg/dL (ref 0.3–1.2)
Total Protein: 6.9 g/dL (ref 6.5–8.1)

## 2021-05-01 LAB — CBC WITH DIFFERENTIAL/PLATELET
Abs Immature Granulocytes: 0.02 10*3/uL (ref 0.00–0.07)
Basophils Absolute: 0 10*3/uL (ref 0.0–0.1)
Basophils Relative: 1 %
Eosinophils Absolute: 0 10*3/uL (ref 0.0–0.5)
Eosinophils Relative: 1 %
HCT: 29.2 % — ABNORMAL LOW (ref 36.0–46.0)
Hemoglobin: 9.4 g/dL — ABNORMAL LOW (ref 12.0–15.0)
Immature Granulocytes: 1 %
Lymphocytes Relative: 10 %
Lymphs Abs: 0.3 10*3/uL — ABNORMAL LOW (ref 0.7–4.0)
MCH: 33.1 pg (ref 26.0–34.0)
MCHC: 32.2 g/dL (ref 30.0–36.0)
MCV: 102.8 fL — ABNORMAL HIGH (ref 80.0–100.0)
Monocytes Absolute: 0.5 10*3/uL (ref 0.1–1.0)
Monocytes Relative: 17 %
Neutro Abs: 2.1 10*3/uL (ref 1.7–7.7)
Neutrophils Relative %: 70 %
Platelets: 139 10*3/uL — ABNORMAL LOW (ref 150–400)
RBC: 2.84 MIL/uL — ABNORMAL LOW (ref 3.87–5.11)
RDW: 17.1 % — ABNORMAL HIGH (ref 11.5–15.5)
WBC: 3 10*3/uL — ABNORMAL LOW (ref 4.0–10.5)
nRBC: 0 % (ref 0.0–0.2)

## 2021-05-01 LAB — GROUP A STREP BY PCR: Group A Strep by PCR: NOT DETECTED

## 2021-05-01 LAB — URINALYSIS, ROUTINE W REFLEX MICROSCOPIC
Bacteria, UA: NONE SEEN
Bilirubin Urine: NEGATIVE
Glucose, UA: NEGATIVE mg/dL
Ketones, ur: NEGATIVE mg/dL
Nitrite: NEGATIVE
Protein, ur: 100 mg/dL — AB
Specific Gravity, Urine: 1.014 (ref 1.005–1.030)
pH: 6 (ref 5.0–8.0)

## 2021-05-01 LAB — PROTIME-INR
INR: 1.1 (ref 0.8–1.2)
Prothrombin Time: 13.8 seconds (ref 11.4–15.2)

## 2021-05-01 LAB — LACTIC ACID, PLASMA: Lactic Acid, Venous: 0.9 mmol/L (ref 0.5–1.9)

## 2021-05-01 LAB — SARS CORONAVIRUS 2 (TAT 6-24 HRS): SARS Coronavirus 2: NEGATIVE

## 2021-05-01 LAB — APTT: aPTT: 28 seconds (ref 24–36)

## 2021-05-01 LAB — RESP PANEL BY RT-PCR (FLU A&B, COVID) ARPGX2
Influenza A by PCR: NEGATIVE
Influenza B by PCR: NEGATIVE
SARS Coronavirus 2 by RT PCR: NEGATIVE

## 2021-05-01 MED ORDER — ACETAMINOPHEN 325 MG PO TABS
650.0000 mg | ORAL_TABLET | Freq: Once | ORAL | Status: AC
  Administered 2021-05-01: 650 mg via ORAL
  Filled 2021-05-01: qty 2

## 2021-05-01 MED ORDER — CIPROFLOXACIN HCL 500 MG PO TABS: 500.0000 mg | ORAL_TABLET | Freq: Two times a day (BID) | ORAL | 0 refills | Status: AC

## 2021-05-01 MED ORDER — CIPROFLOXACIN HCL 500 MG PO TABS
500.0000 mg | ORAL_TABLET | Freq: Once | ORAL | Status: AC
  Administered 2021-05-01: 500 mg via ORAL
  Filled 2021-05-01: qty 1

## 2021-05-01 NOTE — ED Provider Notes (Signed)
[facility] DEPT Provider Note   CSN: [id number] Arrival date & time: 05/01/21  0507     History Chief Complaint  Patient presents with  . Fever    Mary Stark is a 65 y.o. female.  The history is provided by the patient.  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  She had received a chemotherapy infusion yesterday.  She woke up this morning with fever and chills.  Temperature was 103.1.  There has been a slight sore throat and some slight dyspnea but no cough.  She has had some clear rhinorrhea.  She denies chest pain, heaviness, tightness, pressure.  There has been no nausea, vomiting, diarrhea.  She has had some urinary frequency without dysuria.  She is complaining of generalized body aches.  She denies any sick contacts.  She has been fully vaccinated against COVID-19 including booster.   Past Medical History:  Diagnosis Date  . Alcohol dependence ([place])   . Anxiety   . Arthritis    Back and lt knee  . Cancer ([place])    skin on nose  . COVID 08/13/2020   on oxygen x 6 weeks antibodies given, all symptoms resolved except occ sob with exertion  . Depression 07/17/2013  . GERD (gastroesophageal reflux disease)   . History of hepatitis C    TX FOR 2012  . History of kidney stones   . History of rheumatic fever AS CHILD   NO PROBLEMS FROM  . Hyperlipidemia 07/17/2013  . Hypertension   . Seasonal allergies   . Sepsis ([place]) 10/2020   no deficits from  . Skin abnormality since spring 2021   both legs back and neck dark areas without drainage pcp took sample for biopsy no cancer found  . Toe fracture, right 10/07/2016  . Vitamin D deficiency 07/17/2013    Patient Active Problem List   Diagnosis Date Noted  . Right lower quadrant pain 01/01/2021  . Incontinence of feces 01/01/2021  . Abnormal findings on diagnostic imaging of other abdominal regions,  including retroperitoneum 01/01/2021  . Change in bowel habit 01/01/2021  . Hemorrhoids without complication 45/80/9983  . Impingement syndrome, shoulder, left 11/27/2020  . Port-A-Cath in place 11/22/2020  . 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 Malignancy ([place]) 10/31/2020  . 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 Sepsis secondary to UTI ([place]) 10/22/2020  . Normocytic anemia 10/22/2020  . Hyperproteinemia 10/22/2020  . Hydronephrosis 10/01/2020  . Proteinuria 10/01/2020  . Abnormal CT of the abdomen 10/01/2020  . Bilateral lower extremity edema 09/12/2020  . Acute hypoxemic respiratory failure due to COVID-19 ([place]) 08/19/2020  . Tear of medial meniscus of left knee, current 07/13/2019  . Internal derangement of knee involving posterior horn of lateral meniscus, left 07/13/2019  . Primary osteoarthritis of both knees 07/13/2019  . Renal insufficiency 07/13/2019  . Anxiety about health 03/22/2019  . Hypokalemia 03/21/2019  . Essential hypertension 02/13/2019  . 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/2018  . Anemia due to blood loss, acute 04/14/2018  . Hypomagnesemia 04/14/2018  . Hypocalcemia 04/14/2018  . Tobacco abuse 04/08/2018  . Depression 04/07/2018  . AKI (acute kidney injury) ([place]) 04/07/2018  . Sepsis ([place]) 04/07/2018  . Acute colitis 04/07/2018  . Avascular necrosis of humeral head, right ([place]) 03/30/2018  . NSAID long-term use 03/30/2018  . Goals of care, counseling/discussion 03/30/2018  . Seborrheic keratoses 03/15/2018  . History of nonmelanoma skin cancer  03/02/2018  . Skin lesion of chest wall 03/02/2018  . Alcohol dependence with unspecified alcohol-induced disorder (HCC) 02/11/2018  . Severe episode of recurrent major depressive disorder, without psychotic features (HCC) 02/11/2018  . Transaminitis 01/06/2018  . Nicotine dependence 01/06/2018  . Heavy alcohol consumption 01/06/2018  . Encounter for monitoring statin therapy 01/06/2018  . Class 1 obesity due to excess  calories with serious comorbidity in adult 01/06/2018  . Chronic pain syndrome 04/21/2017  . Chronic right shoulder pain 07/03/2015  . Vaginismus 04/12/2014  . Menopausal state 11/08/2013  . Family history of ovarian cancer 09/27/2013  . 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 Personal history of colonic polyps 07/18/2013  . Dyslipidemia, goal LDL below 100 07/17/2013  . Depression with anxiety 07/17/2013  . Hx of hepatitis C 07/17/2013  . Vitamin D deficiency 07/17/2013  . Right lumbar radiculopathy 07/17/2013  . History of alcohol dependence (HCC) 07/17/2013  . H/O: rheumatic fever 06/30/2012  . Insomnia 06/30/2012  . Personal history of other infectious and parasitic diseases 06/30/2012    Past Surgical History:  Procedure Laterality Date  . ANAL RECTAL MANOMETRY N/A 01/31/2020   Procedure: ANO RECTAL MANOMETRY;  Surgeon: Willis Modenautlaw, William, MD;  Location: [facility];  Service: Endoscopy;  Laterality: N/A;  . CESAREAN SECTION  1984  . CYSTOSCOPY W/ URETERAL STENT PLACEMENT Bilateral 01/21/2021   Procedure: CYSTOSCOPY WITH STENT REPLACEMENT;  Surgeon: Noel ChristmasPace, Maryellen D, MD;  [facility];  Service: Urology;  Laterality: Bilateral;  1 HR  . CYSTOSCOPY WITH RETROGRADE PYELOGRAM, URETEROSCOPY AND STENT PLACEMENT Bilateral 06/11/2020   Procedure: CYSTOSCOPY WITH RETROGRADE PYELOGRAM, URETEROSCOPY AND STENT PLACEMENT, RIGHT URETERAL BIOPSY;  Surgeon: Noel ChristmasPace, Maryellen D, MD;  Location: [facility];  Service: Urology;  Laterality: Bilateral;  90 MINS  . CYSTOSCOPY WITH RETROGRADE PYELOGRAM, URETEROSCOPY AND STENT PLACEMENT Bilateral 07/05/2020   Procedure: CYSTOSCOPY WITH RETROGRADE PYELOGRAM, URETEROSCOPY,  BIOPSIES AND STENT EXCHANGES;  Surgeon: Noel ChristmasPace, Maryellen D, MD;  [facility];  Service: Urology;  Laterality: Bilateral;  . FINGER SURGERY Right    5th  . GYNECOLOGIC CRYOSURGERY  YRS AGO  . IR IMAGING GUIDED PORT INSERTION  11/18/2020  .  TONSILLECTOMY  AS CHILD  . TRANSURETHRAL RESECTION OF BLADDER TUMOR  01/21/2021   Procedure: TRANSURETHRAL RESECTION OF BLADDER TUMOR (TURBT);  Surgeon: Noel ChristmasPace, Maryellen D, MD;  [facility];  Service: Urology;;     OB History   No obstetric history on file.     Family History  Problem Relation Age of Onset  . Ovarian cancer Mother   . Cardiomyopathy Father   . Valvular heart disease Father   . Liver cancer Brother   . Protein C deficiency Brother   . Protein C deficiency Brother   . Stroke Brother     Social History   Tobacco Use  . Smoking status: Former Smoker    Packs/day: 1.00    Years: 25.00    Pack years: 25.00    Types: Cigarettes    Quit date: 2012    Years since quitting: 10.3  . Smokeless tobacco: Never Used  Vaping Use  . Vaping Use: Every day  . Substances: Nicotine  Substance Use Topics  . Alcohol use: Yes    Alcohol/week: 24.0 - 36.0 standard drinks    Types: 24 - 36 Cans of beer per week    Comment: occ wine  . Drug use: No    Home Medications Prior to Admission medications  Medication Sig Start Date End Date Taking? Authorizing Provider  buPROPion (WELLBUTRIN XL) 300 MG 24 hr tablet TAKE 1 TABLET BY MOUTH EVERY DAY 04/21/21 04/21/22  [name], DO  acetaminophen (TYLENOL) 500 MG tablet Take 1,000 mg by mouth every 8 (eight) hours as needed for moderate pain.     [provider]  albuterol (PROAIR HFA) 108 (90 Base) MCG/ACT inhaler INHALE 1 TO 2 PUFFS BY MOUTH INTO THE LUNGS EVERY 4 HOURS AS NEEDED FOR WHEEZING OR SHORTNESS OF BREATH Patient taking differently: Inhale 1-2 puffs into the lungs every 4 (four) hours as needed for wheezing or shortness of breath. INHALE 1 TO 2 PUFFS BY MOUTH INTO THE LUNGS EVERY 4 HOURS AS NEEDED FOR WHEEZING OR SHORTNESS OF BREATH 08/28/20   [name], DO  amLODipine (NORVASC) 5 MG tablet TAKE 1 TABLET BY MOUTH ONCE A DAY (NEEDS OFFICE VISIT) 03/21/21 03/21/22  [name],  DO  ARIPiprazole (ABILIFY) 10 MG tablet TAKE 1 TABLET BY MOUTH ONCE DAILY 11/28/20 11/28/21  [name], DO  ARIPiprazole (ABILIFY) 5 MG tablet TAKE 1 TABLET BY MOUTH ONCE A DAY 07/09/20 07/09/21    ARIPiprazole 10 MG TABS Take 10 mg by mouth daily. Patient taking differently: Take 10 mg by mouth at bedtime. abilify 11/28/20   [name], DO  ascorbic acid (VITAMIN C) 500 MG tablet Take 1,000 mg by mouth every evening.     [provider]  aspirin 81 MG EC tablet TAKE 1 TABLET BY MOUTH DAILY. Patient taking differently: Take 81 mg by mouth daily. 04/09/20 06/22/21  [name], DO  buPROPion (WELLBUTRIN XL) 300 MG 24 hr tablet Take 1 tablet (300 mg total) by mouth daily. 11/22/19   Plovsky, Berneta Sages, MD  buPROPion (WELLBUTRIN XL) 300 MG 24 hr tablet TAKE 1 TABLET BY MOUTH EVERY DAY 07/09/20 07/09/21    cephALEXin (KEFLEX) 500 MG capsule TAKE 1 CAPSULE (500 MG TOTAL) BY MOUTH EVERY 8 (EIGHT) HOURS FOR 7 DAYS. 10/25/20 10/25/21  Bonnielee Haff, MD  cephALEXin (KEFLEX) 500 MG capsule TAKE 1 CAPSULE BY MOUTH 2 TIMES DAILY 02/18/21 02/18/22  Hollace Hayward, NP  Cholecalciferol (VITAMIN D) 125 MCG (5000 UT) CAPS Take 5,000 Units by mouth every evening.     [provider]  clonazePAM (KLONOPIN) 0.5 MG tablet TAKE 1 TABLET BY MOUTH AT BEDTIME 04/21/21 10/18/21  Hali Marry, MD  Coenzyme Q10 (CO Q-10) 200 MG CAPS Take 200 mg by mouth every evening.     [provider]  DULoxetine (CYMBALTA) 30 MG capsule TAKE 1 CAPSULE BY MOUTH ONCE DAILY. 09/17/20 09/17/21    fexofenadine (ALLEGRA) 180 MG tablet Take 180 mg by mouth every evening.     [provider]  furosemide (LASIX) 20 MG tablet TAKE 1 TABLET BY MOUTH ONCE A DAY 01/10/21 01/10/22  [name], DO  gabapentin (NEURONTIN) 300 MG capsule 1  qam   4   qhs Patient taking differently: Take 300-1,200 mg by mouth See admin instructions. Take 300 mg by mouth in the morning and afternoon and 1200 mg at  night 11/22/19   Plovsky, Berneta Sages, MD  gabapentin (NEURONTIN) 300 MG capsule TAKE 1 CAPSULE BY MOUTH IN THE MORNING, 1 CAPSULE IN THE AFTERNOON & 4 CAPSULES AT NIGHT 07/09/20 07/09/21    gabapentin (NEURONTIN) 300 MG capsule TAKE 1 CAPSULE BY MOUTH IN THE MORNING, 1 CAP IN THE AFTERNOON & 4 CAPS AT NIGHT 10/29/20 10/29/21    GLUCOSAMINE-CHONDROITIN PO Take 2 tablets by mouth  every evening.     [provider]  guaiFENesin-dextromethorphan (ROBITUSSIN DM) 100-10 MG/5ML syrup Take 10 mLs by mouth every 4 (four) hours as needed for cough. 08/28/20   [name], DO  lidocaine-prilocaine (EMLA) cream Apply 1 application topically as needed. 11/06/20   Wyatt Portela, MD  Meth-Hyo-M Bl-Na Phos-Ph Sal (URO-MP) 118 MG CAPS TAKE 1 CAPSULE BY MOUTH 3 TIMES DAILY AS NEEDED WITH A FULL GLASS OF WATER 02/18/21 02/18/22  Hollace Hayward, NP  mirabegron ER (MYRBETRIQ) 25 MG TB24 tablet TAKE 1 TABLET BY MOUTH ONCE A DAY 04/21/21 04/21/22    MYRBETRIQ 25 MG TB24 tablet Take 25 mg by mouth daily. 12/26/20   [provider]  Nebivolol HCl 20 MG TABS TAKE 1 TABLET BY MOUTH DAILY. 04/16/21 04/16/22  [name], DO  nitrofurantoin (MACRODANTIN) 100 MG capsule Take 1 capsule (100 mg total) by mouth at bedtime. 10/30/20   [name], DO  Omega-3 Fatty Acids (FISH OIL) 1200 MG CAPS Take 1,200 mg by mouth every evening.     [provider]  omeprazole (PRILOSEC) 20 MG capsule TAKE 1 CAPSULE BY MOUTH ONCE A DAY * NEEDS APPT FOR REFILLS 03/12/21 03/12/22  [name], DO  ondansetron (ZOFRAN) 4 MG tablet Take 1 tablet (4 mg total) by mouth every 8 (eight) hours as needed for nausea or vomiting. 04/30/21   Wyatt Portela, MD  ondansetron (ZOFRAN-ODT) 4 MG disintegrating tablet Take 1 tablet (4 mg total) by mouth every 8 (eight) hours as needed for nausea or vomiting. 06/03/20   Noe Gens, PA-C  oxybutynin (DITROPAN) 5 MG tablet TAKE 1 TABLET BY MOUTH EVERY 8 HOURS AS NEEDED 02/04/21  02/04/22  Robley Fries, MD  oxyCODONE-acetaminophen (PERCOCET) 10-325 MG tablet Take 1 tablet by mouth every 8 (eight) hours as needed for pain. 01/02/21   Silverio Decamp, MD  simvastatin (ZOCOR) 20 MG tablet TAKE 1 TABLET (20 MG TOTAL) BY MOUTH DAILY. 04/02/21 04/02/22  [name], DO  sodium bicarbonate 650 MG tablet TAKE 1 TABLET (650 MG TOTAL) BY MOUTH 2 (TWO) TIMES DAILY FOR 10 DAYS. 10/25/20 10/25/21  Bonnielee Haff, MD  tamsulosin (FLOMAX) 0.4 MG CAPS capsule Take 0.4 mg by mouth at bedtime. 12/26/20   [provider]  tamsulosin (FLOMAX) 0.4 MG CAPS capsule TAKE 1 CAPSULE BY MOUTH ONCE DAILY AT BEDTIME. 08/07/20 08/07/21  Daine Gravel L, NP  tamsulosin (FLOMAX) 0.4 MG CAPS capsule TAKE 1 CAPSULE BY MOUTH AT BEDTIME 04/21/21 04/21/22    thiamine 100 MG tablet Take 1 tablet (100 mg total) by mouth daily. 10/26/20   Bonnielee Haff, MD  vitamin B-12 (CYANOCOBALAMIN) 1000 MCG tablet Take 2,000 mcg by mouth every evening.     [provider]  Vitamin E 180 MG (400 UNIT) CAPS Take 400 Units by mouth every evening.     [provider]    Allergies    Metformin and related and Penicillins  Review of Systems   Review of Systems  Constitutional: Positive for fever.  All other systems reviewed and are negative.   Physical Exam Updated Vital Signs BP 136/77 (BP Location: Left Arm)   Pulse 85   Temp (!) 102.9 F (39.4 C) (Oral)   Resp 20   SpO2 96%   Physical Exam Vitals and nursing note reviewed.   65 year old female, resting comfortably and in no acute distress. Vital signs are significant for elevated temperature. Oxygen saturation is 96%, which is normal.  She is nontoxic in appearance. Head is normocephalic and atraumatic. PERRLA, EOMI. Oropharynx is clear. Neck is nontender and supple without adenopathy or JVD. Back is nontender and there is no CVA tenderness. Lungs are clear without rales, wheezes, or rhonchi. Chest is nontender. Heart  has regular rate and rhythm without murmur. Abdomen is soft, flat, nontender without masses or hepatosplenomegaly and peristalsis is normoactive. Extremities have 1+ edema, full range of motion is present. Skin is warm and dry without rash. Neurologic: Mental status is normal, cranial nerves are intact, there are no motor or sensory deficits.  ED Results / Procedures / Treatments   Labs (all labs ordered are listed, but only abnormal results are displayed) Labs Reviewed  URINE CULTURE  CULTURE, BLOOD (ROUTINE X 2)  CULTURE, BLOOD (ROUTINE X 2)  SARS CORONAVIRUS 2 (TAT 6-24 HRS)  GROUP A STREP BY PCR  LACTIC ACID, PLASMA  LACTIC ACID, PLASMA  COMPREHENSIVE METABOLIC PANEL  CBC WITH DIFFERENTIAL/PLATELET  PROTIME-INR  APTT  URINALYSIS, ROUTINE W REFLEX MICROSCOPIC    EKG None  Radiology No results found.  Procedures Procedures   Medications Ordered in ED Medications  acetaminophen (TYLENOL) tablet 650 mg (650 mg Oral Given 05/01/21 0546)    ED Course  I have reviewed the triage vital signs and the nursing notes.  Pertinent labs & imaging results that were available during my care of the patient were reviewed by me and considered in my medical decision making (see chart for details).   MDM Rules/Calculators/A&P                         Fever and cancer patient getting chemotherapy.  Symptoms are concerning for possible viral respiratory infection, possible UTI.  Old records are reviewed, and it is noted that she did have an infusion yesterday which included fluorouracil, oxaliplatin, leucovorin.  WBC yesterday was 4.4 with absolute neutrophil count of 1.9.  She is started on evolving sepsis pathway and is given acetaminophen for fever.  ECG shows no acute changes.  WBC today is essentially unchanged.  Absolute neutrophil count is adequate at 2.1.  Anemia is present as well as mild renal insufficiency, both unchanged from baseline.  Chest x-ray shows no clear evidence of  pneumonia.  Urinalysis is pending.  Lactic acid level is normal.  Case is signed out to Dr. Langston Masker.  Final Clinical Impression(s) / ED Diagnoses Final diagnoses:  Fever in adult  Macrocytic anemia  Renal insufficiency    Rx / DC Orders ED Discharge Orders    None       Delora Fuel, MD [id number] [phone]

## 2021-05-01 NOTE — ED Triage Notes (Signed)
Pt arrived via POV, c/o fevers and fatigue at home started tonight, generalized "feeling awful". Denies any n/v or diarrhea. States fever as high as 103.4 at home, has not taken any tylenol yet.   Chemo actively running through port at this time.

## 2021-05-01 NOTE — ED Notes (Signed)
Patient is in room with husband at bedside.  

## 2021-05-01 NOTE — ED Provider Notes (Signed)
65 year old female w/ hx of cancer on chemo presenting to ED with fevers and fatigue at home.  WBC 3.0; not neutropenic here.  Febrile 102.26F.    She feels better since coming to the ER.  She denies headache.  She denies coughing.  She denies nausea, vomiting, abdominal pain.  She lives with her husband is present at bedside.  Pending UA, Covid test - reevaluation. Vitals stable here, BP stable, lactate normal.   Physical Exam  BP 133/69   Pulse 86   Temp 99.8 F (37.7 C) (Oral)   Resp (!) 25   SpO2 97%   Physical Exam  No acute distress, regular heart rate and rhythm.  No hypoxia, no respiratory distress, no cough.  ED Course/Procedures     Procedures  MDM   Covid/flu negative, UA without sign of infection. Lactate normal.  I spoke to Dr Alen Blew her oncologist and reviewed her medical work-up.  He agrees would be reasonable to discharge the patient home on oral antibiotics, given that she is feeling better, and has no evident signs or symptoms of sepsis.  This fever may be related to her chemo treatment.  He has suggested a short course of fluoroquinolone such as ciprofloxacin 500 mg twice daily for 5 days, given that she has penicillin allergies.  She can follow-up in the office.  Her blood cultures are negative they will likely discontinue these antibiotics.  Reviewed the plan with the patient and her husband and they are in agreement.     Wyvonnia Dusky, MD 05/01/21 [phone]

## 2021-05-01 NOTE — Discharge Instructions (Addendum)
You will begin taking antibiotics for 5 days while we are waiting on your blood culture results.  We talked about the possibility of your fever and symptoms being related to your chemotherapy, but can also be a sign of an infection.  This is why you are starting antibiotics.  If you begin to feel very lightheaded, dizzy, have difficulty breathing, or severe vomiting, please call 911 and come back to the ER immediately.  If your blood cultures return positive, you will get a phone call in the next day or 2 telling you to come back to the hospital immediately for IV antibiotics.  Please reach out to Dr. Hazeline Junker office tomorrow for medical reassessment.  You can continue taking Tylenol at home for fevers, 1000 mg every 8 hours, for the next 5-7 days.

## 2021-05-01 NOTE — ED Notes (Signed)
Patient is actively receiving chemotherapy through her port.

## 2021-05-01 NOTE — Telephone Encounter (Signed)
Lvm.  Thank you.

## 2021-05-02 ENCOUNTER — Other Ambulatory Visit: Payer: Self-pay

## 2021-05-02 ENCOUNTER — Inpatient Hospital Stay: Payer: No Typology Code available for payment source

## 2021-05-02 VITALS — BP 110/76 | HR 71 | Temp 98.5°F | Resp 18

## 2021-05-02 DIAGNOSIS — Z5111 Encounter for antineoplastic chemotherapy: Secondary | ICD-10-CM | POA: Diagnosis not present

## 2021-05-02 DIAGNOSIS — C669 Malignant neoplasm of unspecified ureter: Secondary | ICD-10-CM

## 2021-05-02 DIAGNOSIS — C801 Malignant (primary) neoplasm, unspecified: Secondary | ICD-10-CM

## 2021-05-02 LAB — URINE CULTURE: Culture: NO GROWTH

## 2021-05-02 MED ORDER — SODIUM CHLORIDE 0.9% FLUSH
10.0000 mL | INTRAVENOUS | Status: DC | PRN
  Administered 2021-05-02: 10 mL
  Filled 2021-05-02: qty 10

## 2021-05-02 MED ORDER — HEPARIN SOD (PORK) LOCK FLUSH 100 UNIT/ML IV SOLN
500.0000 [IU] | Freq: Once | INTRAVENOUS | Status: AC | PRN
  Administered 2021-05-02: 500 [IU]
  Filled 2021-05-02: qty 5

## 2021-05-06 ENCOUNTER — Other Ambulatory Visit (HOSPITAL_COMMUNITY): Payer: Self-pay

## 2021-05-06 ENCOUNTER — Other Ambulatory Visit: Payer: Self-pay | Admitting: Osteopathic Medicine

## 2021-05-06 LAB — CULTURE, BLOOD (ROUTINE X 2)
Culture: NO GROWTH
Special Requests: ADEQUATE

## 2021-05-07 ENCOUNTER — Other Ambulatory Visit: Payer: Self-pay | Admitting: Osteopathic Medicine

## 2021-05-07 ENCOUNTER — Other Ambulatory Visit (HOSPITAL_COMMUNITY): Payer: Self-pay

## 2021-05-07 MED ORDER — GABAPENTIN 300 MG PO CAPS
ORAL_CAPSULE | ORAL | 3 refills | Status: DC
  Filled 2021-05-07: qty 180, 30d supply, fill #0
  Filled 2021-06-13: qty 180, 30d supply, fill #1
  Filled 2021-07-22: qty 180, 30d supply, fill #2
  Filled 2021-08-26: qty 180, 30d supply, fill #3

## 2021-05-07 NOTE — Telephone Encounter (Signed)
[facility] pharmacy requesting med refill for gabapentin. Written by historical provider.

## 2021-05-13 ENCOUNTER — Inpatient Hospital Stay: Payer: No Typology Code available for payment source

## 2021-05-13 ENCOUNTER — Inpatient Hospital Stay (HOSPITAL_BASED_OUTPATIENT_CLINIC_OR_DEPARTMENT_OTHER): Payer: No Typology Code available for payment source | Admitting: Oncology

## 2021-05-13 ENCOUNTER — Other Ambulatory Visit: Payer: Self-pay

## 2021-05-13 VITALS — BP 150/92 | HR 78 | Temp 98.5°F | Resp 18 | Wt 185.0 lb

## 2021-05-13 DIAGNOSIS — C801 Malignant (primary) neoplasm, unspecified: Secondary | ICD-10-CM

## 2021-05-13 DIAGNOSIS — C669 Malignant neoplasm of unspecified ureter: Secondary | ICD-10-CM

## 2021-05-13 DIAGNOSIS — Z5111 Encounter for antineoplastic chemotherapy: Secondary | ICD-10-CM | POA: Diagnosis not present

## 2021-05-13 DIAGNOSIS — Z95828 Presence of other vascular implants and grafts: Secondary | ICD-10-CM

## 2021-05-13 LAB — CBC WITH DIFFERENTIAL (CANCER CENTER ONLY)
Abs Immature Granulocytes: 0.03 10*3/uL (ref 0.00–0.07)
Basophils Absolute: 0 10*3/uL (ref 0.0–0.1)
Basophils Relative: 0 %
Eosinophils Absolute: 0.1 10*3/uL (ref 0.0–0.5)
Eosinophils Relative: 2 %
HCT: 28.3 % — ABNORMAL LOW (ref 36.0–46.0)
Hemoglobin: 9.2 g/dL — ABNORMAL LOW (ref 12.0–15.0)
Immature Granulocytes: 1 %
Lymphocytes Relative: 44 %
Lymphs Abs: 1.6 10*3/uL (ref 0.7–4.0)
MCH: 32.9 pg (ref 26.0–34.0)
MCHC: 32.5 g/dL (ref 30.0–36.0)
MCV: 101.1 fL — ABNORMAL HIGH (ref 80.0–100.0)
Monocytes Absolute: 0.5 10*3/uL (ref 0.1–1.0)
Monocytes Relative: 15 %
Neutro Abs: 1.4 10*3/uL — ABNORMAL LOW (ref 1.7–7.7)
Neutrophils Relative %: 38 %
Platelet Count: 100 10*3/uL — ABNORMAL LOW (ref 150–400)
RBC: 2.8 MIL/uL — ABNORMAL LOW (ref 3.87–5.11)
RDW: 16.4 % — ABNORMAL HIGH (ref 11.5–15.5)
WBC Count: 3.6 10*3/uL — ABNORMAL LOW (ref 4.0–10.5)
nRBC: 0 % (ref 0.0–0.2)

## 2021-05-13 LAB — CMP (CANCER CENTER ONLY)
ALT: 11 U/L (ref 0–44)
AST: 17 U/L (ref 15–41)
Albumin: 3.7 g/dL (ref 3.5–5.0)
Alkaline Phosphatase: 93 U/L (ref 38–126)
Anion gap: 12 (ref 5–15)
BUN: 22 mg/dL (ref 8–23)
CO2: 23 mmol/L (ref 22–32)
Calcium: 9.2 mg/dL (ref 8.9–10.3)
Chloride: 106 mmol/L (ref 98–111)
Creatinine: 1.48 mg/dL — ABNORMAL HIGH (ref 0.44–1.00)
GFR, Estimated: 40 mL/min — ABNORMAL LOW (ref >60)
Glucose, Bld: 100 mg/dL — ABNORMAL HIGH (ref 70–99)
Potassium: 3.9 mmol/L (ref 3.5–5.1)
Sodium: 141 mmol/L (ref 135–145)
Total Bilirubin: 0.2 mg/dL — ABNORMAL LOW (ref 0.3–1.2)
Total Protein: 7.3 g/dL (ref 6.5–8.1)

## 2021-05-13 LAB — SAMPLE TO BLOOD BANK

## 2021-05-13 MED ORDER — FLUOROURACIL CHEMO INJECTION 2.5 GM/50ML
400.0000 mg/m2 | Freq: Once | INTRAVENOUS | Status: AC
  Administered 2021-05-13: 800 mg via INTRAVENOUS
  Filled 2021-05-13: qty 16

## 2021-05-13 MED ORDER — SODIUM CHLORIDE 0.9 % IV SOLN
10.0000 mg | Freq: Once | INTRAVENOUS | Status: AC
  Administered 2021-05-13: 10 mg via INTRAVENOUS
  Filled 2021-05-13: qty 10

## 2021-05-13 MED ORDER — PALONOSETRON HCL INJECTION 0.25 MG/5ML
INTRAVENOUS | Status: AC
  Filled 2021-05-13: qty 5

## 2021-05-13 MED ORDER — DEXTROSE 5 % IV SOLN
Freq: Once | INTRAVENOUS | Status: DC
  Filled 2021-05-13: qty 250

## 2021-05-13 MED ORDER — LEUCOVORIN CALCIUM INJECTION 350 MG
400.0000 mg/m2 | Freq: Once | INTRAVENOUS | Status: AC
  Administered 2021-05-13: 804 mg via INTRAVENOUS
  Filled 2021-05-13: qty 40.2

## 2021-05-13 MED ORDER — OXALIPLATIN CHEMO INJECTION 100 MG/20ML
85.0000 mg/m2 | Freq: Once | INTRAVENOUS | Status: AC
  Administered 2021-05-13: 170 mg via INTRAVENOUS
  Filled 2021-05-13: qty 34

## 2021-05-13 MED ORDER — DEXTROSE 5 % IV SOLN
Freq: Once | INTRAVENOUS | Status: AC
  Filled 2021-05-13: qty 250

## 2021-05-13 MED ORDER — PALONOSETRON HCL INJECTION 0.25 MG/5ML
0.2500 mg | Freq: Once | INTRAVENOUS | Status: AC
  Administered 2021-05-13: 0.25 mg via INTRAVENOUS

## 2021-05-13 MED ORDER — SODIUM CHLORIDE 0.9 % IV SOLN
2400.0000 mg/m2 | INTRAVENOUS | Status: DC
  Administered 2021-05-13: 4800 mg via INTRAVENOUS
  Filled 2021-05-13: qty 96

## 2021-05-13 MED ORDER — HEPARIN SOD (PORK) LOCK FLUSH 100 UNIT/ML IV SOLN
500.0000 [IU] | Freq: Once | INTRAVENOUS | Status: DC | PRN
  Filled 2021-05-13: qty 5

## 2021-05-13 MED ORDER — SODIUM CHLORIDE 0.9% FLUSH
10.0000 mL | Freq: Once | INTRAVENOUS | Status: AC
  Administered 2021-05-13: 10 mL
  Filled 2021-05-13: qty 10

## 2021-05-13 MED ORDER — SODIUM CHLORIDE 0.9% FLUSH
10.0000 mL | INTRAVENOUS | Status: DC | PRN
  Filled 2021-05-13: qty 10

## 2021-05-13 NOTE — Patient Instructions (Signed)
[facility]  Discharge Instructions: Thank you for choosing [facility] to provide your oncology and hematology care.   If you have a lab appointment with the [facility], please go directly to the [facility] and check in at the registration area.   Wear comfortable clothing and clothing appropriate for easy access to any Portacath or PICC line.   We strive to give you quality time with your provider. You may need to reschedule your appointment if you arrive late (15 or more minutes).  Arriving late affects you and other patients whose appointments are after yours.  Also, if you miss three or more appointments without notifying the office, you may be dismissed from the clinic at the provider's discretion.      For prescription refill requests, have your pharmacy contact our office and allow 72 hours for refills to be completed.    Today you received the following chemotherapy and/or immunotherapy agents fluorourcil, oxaliplatin, leucovorin    To help prevent nausea and vomiting after your treatment, we encourage you to take your nausea medication as directed.  BELOW ARE SYMPTOMS THAT SHOULD BE REPORTED IMMEDIATELY: . *FEVER GREATER THAN 100.4 F (38 C) OR HIGHER . *CHILLS OR SWEATING . *NAUSEA AND VOMITING THAT IS NOT CONTROLLED WITH YOUR NAUSEA MEDICATION . *UNUSUAL SHORTNESS OF BREATH . *UNUSUAL BRUISING OR BLEEDING . *URINARY PROBLEMS (pain or burning when urinating, or frequent urination) . *BOWEL PROBLEMS (unusual diarrhea, constipation, pain near the anus) . TENDERNESS IN MOUTH AND THROAT WITH OR WITHOUT PRESENCE OF ULCERS (sore throat, sores in mouth, or a toothache) . UNUSUAL RASH, SWELLING OR PAIN  . UNUSUAL VAGINAL DISCHARGE OR ITCHING   Items with * indicate a potential emergency and should be followed up as soon as possible or go to the Emergency Department if any problems should occur.  Please show the CHEMOTHERAPY ALERT CARD  or IMMUNOTHERAPY ALERT CARD at check-in to the Emergency Department and triage nurse.  Should you have questions after your visit or need to cancel or reschedule your appointment, please contact [facility]  Dept: [phone]  and follow the prompts.  Office hours are 8:00 a.m. to 4:30 p.m. Monday - Friday. Please note that voicemails left after 4:00 p.m. may not be returned until the following business day.  We are closed weekends and major holidays. You have access to a nurse at all times for urgent questions. Please call the main number to the clinic Dept: [phone] and follow the prompts.   For any non-urgent questions, you may also contact your provider using MyChart. We now offer e-Visits for anyone 42 and older to request care online for non-urgent symptoms. For details visit [email or web address].   Also download the MyChart app! Go to the app store, search "MyChart", open the app, select [facility], and log in with your MyChart username and password.  Due to Covid, a mask is required upon entering the hospital/clinic. If you do not have a mask, one will be given to you upon arrival. For doctor visits, patients may have 1 support person aged 46 or older with them. For treatment visits, patients cannot have anyone with them due to current Covid guidelines and our immunocompromised population.

## 2021-05-13 NOTE — Progress Notes (Signed)
Per. Dr. Alen Blew OK to treat with today's labs

## 2021-05-13 NOTE — Progress Notes (Signed)
Hematology and Oncology Follow Up Visit  Mary Stark [id number] 1956/04/17 65 y.o. 05/13/2021 8:33 AM [name], DOAlexander, [place], DO   [name]: 65 year old woman with stage IV adenocarcinoma of the bladder diagnosed in November 2021.  She presented with retroperitoneal adenopathy with next generation sequencing did not show any actionable mutation and PD-L1 score of 1.   Prior Therapy:  She is status post retroperitoneal lymph node biopsy completed on 10/24/2020 which showed metastatic carcinoma.  She is status post repeat cystoscopy and bilateral ureteral stent exchange as well as resection of a bladder tumor on January 21, 2021.  A final pathology showed an adenocarcinoma.  Chemotherapy utilizing carboplatin and gemcitabine started on November 22, 2020.  She completed 6 cycles of therapy on March 06, 2021.  Current therapy: FOLFOX chemotherapy started on Apr 15, 2021.  He is here for cycle 3 of therapy.   Interim History: Mary Stark is here for repeat evaluation.  Since the last visit, she reports not making changes.  She received the last cycle of chemotherapy without any major complications.  She did develop a low-grade fever and was evaluated in the emergency department acutely and her symptoms resolved spontaneously.  She denies any nausea, vomiting or abdominal pain she denies any worsening neuropathy or bleeding issues.  Performance status quality of life remained reasonable.     Medications: Updated on review. Current Outpatient Medications  Medication Sig Dispense Refill  . buPROPion (WELLBUTRIN XL) 300 MG 24 hr tablet TAKE 1 TABLET BY MOUTH EVERY DAY 30 tablet 3  . acetaminophen (TYLENOL) 500 MG tablet Take 1,000 mg by mouth every 8 (eight) hours as needed for moderate pain.     Marland Kitchen albuterol (PROAIR HFA) 108 (90 Base) MCG/ACT inhaler INHALE 1 TO 2 PUFFS BY MOUTH INTO THE LUNGS EVERY 4 HOURS AS NEEDED FOR WHEEZING OR SHORTNESS OF BREATH (Patient  taking differently: Inhale 1-2 puffs into the lungs every 4 (four) hours as needed for wheezing or shortness of breath. INHALE 1 TO 2 PUFFS BY MOUTH INTO THE LUNGS EVERY 4 HOURS AS NEEDED FOR WHEEZING OR SHORTNESS OF BREATH) 8.5 g 1  . amLODipine (NORVASC) 5 MG tablet TAKE 1 TABLET BY MOUTH ONCE A DAY (NEEDS OFFICE VISIT) 90 tablet 0  . ARIPiprazole (ABILIFY) 10 MG tablet TAKE 1 TABLET BY MOUTH ONCE DAILY 90 tablet 0  . ARIPiprazole (ABILIFY) 5 MG tablet TAKE 1 TABLET BY MOUTH ONCE A DAY 30 tablet 3  . ARIPiprazole 10 MG TABS Take 10 mg by mouth daily. (Patient taking differently: Take 10 mg by mouth at bedtime. abilify) 90 tablet 1  . ascorbic acid (VITAMIN C) 500 MG tablet Take 1,000 mg by mouth every evening.     Marland Kitchen aspirin 81 MG EC tablet TAKE 1 TABLET BY MOUTH DAILY. (Patient taking differently: Take 81 mg by mouth daily.) 90 tablet 1  . buPROPion (WELLBUTRIN XL) 300 MG 24 hr tablet Take 1 tablet (300 mg total) by mouth daily. 90 tablet 1  . buPROPion (WELLBUTRIN XL) 300 MG 24 hr tablet TAKE 1 TABLET BY MOUTH EVERY DAY 30 tablet 3  . cephALEXin (KEFLEX) 500 MG capsule TAKE 1 CAPSULE (500 MG TOTAL) BY MOUTH EVERY 8 (EIGHT) HOURS FOR 7 DAYS. 21 capsule 0  . cephALEXin (KEFLEX) 500 MG capsule TAKE 1 CAPSULE BY MOUTH 2 TIMES DAILY 14 capsule 0  . Cholecalciferol (VITAMIN D) 125 MCG (5000 UT) CAPS Take 5,000 Units by mouth every evening.     Marland Kitchen  clonazePAM (KLONOPIN) 0.5 MG tablet TAKE 1 TABLET BY MOUTH AT BEDTIME 30 tablet 0  . Coenzyme Q10 (CO Q-10) 200 MG CAPS Take 200 mg by mouth every evening.     . DULoxetine (CYMBALTA) 30 MG capsule TAKE 1 CAPSULE BY MOUTH ONCE DAILY. 30 capsule 3  . fexofenadine (ALLEGRA) 180 MG tablet Take 180 mg by mouth every evening.     . furosemide (LASIX) 20 MG tablet TAKE 1 TABLET BY MOUTH ONCE A DAY 90 tablet 3  . gabapentin (NEURONTIN) 300 MG capsule 1  qam   4   qhs (Patient taking differently: Take 300-1,200 mg by mouth See admin instructions. Take 300 mg by mouth  in the morning and afternoon and 1200 mg at night) 150 capsule 5  . gabapentin (NEURONTIN) 300 MG capsule TAKE 1 CAPSULE BY MOUTH IN THE MORNING, 1 CAPSULE IN THE AFTERNOON & 4 CAPSULES AT NIGHT 180 capsule 3  . gabapentin (NEURONTIN) 300 MG capsule TAKE 1 CAPSULE BY MOUTH IN THE MORNING, 1 CAP IN THE AFTERNOON & 4 CAPS AT NIGHT 180 capsule 3  . GLUCOSAMINE-CHONDROITIN PO Take 2 tablets by mouth every evening.     Marland Kitchen guaiFENesin-dextromethorphan (ROBITUSSIN DM) 100-10 MG/5ML syrup Take 10 mLs by mouth every 4 (four) hours as needed for cough. 473 mL 0  . lidocaine-prilocaine (EMLA) cream Apply 1 application topically as needed. 30 g 0  . Meth-Hyo-M Bl-Na Phos-Ph Sal (URO-MP) 118 MG CAPS TAKE 1 CAPSULE BY MOUTH 3 TIMES DAILY AS NEEDED WITH A FULL GLASS OF WATER 20 capsule 3  . mirabegron ER (MYRBETRIQ) 25 MG TB24 tablet TAKE 1 TABLET BY MOUTH ONCE A DAY 30 tablet 3  . MYRBETRIQ 25 MG TB24 tablet Take 25 mg by mouth daily.    . Nebivolol HCl 20 MG TABS TAKE 1 TABLET BY MOUTH DAILY. 90 tablet 2  . nitrofurantoin (MACRODANTIN) 100 MG capsule Take 1 capsule (100 mg total) by mouth at bedtime. 90 capsule 1  . Omega-3 Fatty Acids (FISH OIL) 1200 MG CAPS Take 1,200 mg by mouth every evening.     Marland Kitchen omeprazole (PRILOSEC) 20 MG capsule TAKE 1 CAPSULE BY MOUTH ONCE A DAY * NEEDS APPT FOR REFILLS 90 capsule 0  . ondansetron (ZOFRAN) 4 MG tablet Take 1 tablet (4 mg total) by mouth every 8 (eight) hours as needed for nausea or vomiting. 40 tablet 3  . ondansetron (ZOFRAN-ODT) 4 MG disintegrating tablet Take 1 tablet (4 mg total) by mouth every 8 (eight) hours as needed for nausea or vomiting. 12 tablet 0  . oxybutynin (DITROPAN) 5 MG tablet TAKE 1 TABLET BY MOUTH EVERY 8 HOURS AS NEEDED 90 tablet 0  . oxyCODONE-acetaminophen (PERCOCET) 10-325 MG tablet Take 1 tablet by mouth every 8 (eight) hours as needed for pain. 90 tablet 0  . simvastatin (ZOCOR) 20 MG tablet TAKE 1 TABLET (20 MG TOTAL) BY MOUTH DAILY. 90  tablet 0  . sodium bicarbonate 650 MG tablet TAKE 1 TABLET (650 MG TOTAL) BY MOUTH 2 (TWO) TIMES DAILY FOR 10 DAYS. 20 tablet 0  . tamsulosin (FLOMAX) 0.4 MG CAPS capsule Take 0.4 mg by mouth at bedtime.    . tamsulosin (FLOMAX) 0.4 MG CAPS capsule TAKE 1 CAPSULE BY MOUTH ONCE DAILY AT BEDTIME. 30 capsule 1  . tamsulosin (FLOMAX) 0.4 MG CAPS capsule TAKE 1 CAPSULE BY MOUTH AT BEDTIME 30 capsule 3  . thiamine 100 MG tablet Take 1 tablet (100 mg total) by mouth daily. 30 tablet  0  . vitamin B-12 (CYANOCOBALAMIN) 1000 MCG tablet Take 2,000 mcg by mouth every evening.     . Vitamin E 180 MG (400 UNIT) CAPS Take 400 Units by mouth every evening.      No current facility-administered medications for this visit.     Allergies:  Allergies  Allergen Reactions  . Metformin And Related Other (See Comments)    Lactic acidosis   . Penicillins Hives and Rash    Reports hives to penicillin and amoxicillin as an adult "a long time ago." Has tolerated cefdinir (09/2020) and cefepime (10/2020) before.      Physical Exam:    Blood pressure (!) 150/92, pulse 78, temperature 98.5 F (36.9 C), temperature source Oral, resp. rate 18, weight 185 lb (83.9 kg), SpO2 100 %.      ECOG: 1     General appearance: Alert, awake without any distress. Head: Atraumatic without abnormalities Oropharynx: Without any thrush or ulcers. Eyes: No scleral icterus. Lymph nodes: No lymphadenopathy noted in the cervical, supraclavicular, or axillary nodes Heart:regular rate and rhythm, without any murmurs or gallops.   Lung: Clear to auscultation without any rhonchi, wheezes or dullness to percussion. Abdomin: Soft, nontender without any shifting dullness or ascites. Musculoskeletal: No clubbing or cyanosis. Neurological: No motor or sensory deficits. Skin: No rashes or lesions.           Lab Results: Lab Results  Component Value Date   WBC 3.0 (L) 05/01/2021   HGB 9.4 (L) 05/01/2021   HCT 29.2  (L) 05/01/2021   MCV 102.8 (H) 05/01/2021   PLT 139 (L) 05/01/2021     Chemistry      Component Value Date/Time   NA 134 (L) 05/01/2021 0619   NA 142 04/26/2018 1333   K 3.6 05/01/2021 0619   CL 105 05/01/2021 0619   CO2 21 (L) 05/01/2021 0619   BUN 23 05/01/2021 0619   BUN 22 04/26/2018 1333   CREATININE 1.34 (H) 05/01/2021 0619   CREATININE 1.26 (H) 04/30/2021 1107   CREATININE 0.83 09/22/2020 1330   GLU 87 02/07/2013 0000      Component Value Date/Time   CALCIUM 8.4 (L) 05/01/2021 0619   ALKPHOS 63 05/01/2021 0619   AST 19 05/01/2021 0619   AST 19 04/30/2021 1107   ALT 15 05/01/2021 0619   ALT 10 04/30/2021 1107   BILITOT 0.4 05/01/2021 0619   BILITOT <0.2 (L) 04/30/2021 1107       Impression and Plan:    65 year old woman with:  1.    GU cancer diagnosed in November 2021.  She was found to have stage IV adenocarcinoma likely arising from the urachus/bladder.  Disease status was updated and treatment options were reviewed.  She is currently receiving FOLFOX salvage chemotherapy without any major complications.  Risks and benefits of continuing this treatment were reviewed.  These include nausea, vomiting, myelosuppression, neutropenia and possible sepsis.  The plan is to update staging scans after the next cycle of therapy.  She is agreeable to proceed at this time.   2. IV access:Port-A-Cath remains in place without any issues.  3. Antiemetics: No nausea or vomiting reported at this time.  Zofran is available to her.  4. Renal function surveillance: Creatinine clearance remains adequate at this time.  We will continue to monitor on platinum therapy.  She is scheduled for cystoscopy and stent exchange by Dr. Claudia Desanctis next week.  5. Goals of care:Therapy remains palliative at this time.  Although aggressive measures  are warranted.   6.  Anemia: Related to malignancy and chemotherapy.  She has received packed red cell transfusion in the past.  7.  Follow-up: She will return in 2 weeks for the next cycle of therapy.  30  minutes were dedicated to this encounter.  The time was spent on reviewing laboratory data, disease status update, treatment options and future plan of care review.   Zola Button, MD 5/31/20228:33 AM

## 2021-05-14 ENCOUNTER — Encounter (HOSPITAL_BASED_OUTPATIENT_CLINIC_OR_DEPARTMENT_OTHER): Payer: Self-pay | Admitting: Urology

## 2021-05-15 ENCOUNTER — Inpatient Hospital Stay: Payer: No Typology Code available for payment source | Attending: Oncology

## 2021-05-15 ENCOUNTER — Other Ambulatory Visit: Payer: Self-pay

## 2021-05-15 ENCOUNTER — Encounter (HOSPITAL_BASED_OUTPATIENT_CLINIC_OR_DEPARTMENT_OTHER): Payer: Self-pay | Admitting: Urology

## 2021-05-15 VITALS — BP 130/80 | HR 64 | Temp 98.5°F | Resp 18

## 2021-05-15 DIAGNOSIS — C669 Malignant neoplasm of unspecified ureter: Secondary | ICD-10-CM | POA: Diagnosis present

## 2021-05-15 DIAGNOSIS — C801 Malignant (primary) neoplasm, unspecified: Secondary | ICD-10-CM

## 2021-05-15 DIAGNOSIS — R5383 Other fatigue: Secondary | ICD-10-CM | POA: Diagnosis not present

## 2021-05-15 DIAGNOSIS — R0609 Other forms of dyspnea: Secondary | ICD-10-CM | POA: Diagnosis not present

## 2021-05-15 DIAGNOSIS — D709 Neutropenia, unspecified: Secondary | ICD-10-CM | POA: Insufficient documentation

## 2021-05-15 DIAGNOSIS — D630 Anemia in neoplastic disease: Secondary | ICD-10-CM | POA: Insufficient documentation

## 2021-05-15 DIAGNOSIS — Z95828 Presence of other vascular implants and grafts: Secondary | ICD-10-CM

## 2021-05-15 DIAGNOSIS — Z5111 Encounter for antineoplastic chemotherapy: Secondary | ICD-10-CM | POA: Insufficient documentation

## 2021-05-15 MED ORDER — HEPARIN SOD (PORK) LOCK FLUSH 100 UNIT/ML IV SOLN
500.0000 [IU] | Freq: Once | INTRAVENOUS | Status: AC
  Administered 2021-05-15: 500 [IU]
  Filled 2021-05-15: qty 5

## 2021-05-15 MED ORDER — SODIUM CHLORIDE 0.9% FLUSH
10.0000 mL | Freq: Once | INTRAVENOUS | Status: AC
  Administered 2021-05-15: 10 mL
  Filled 2021-05-15: qty 10

## 2021-05-15 NOTE — Progress Notes (Signed)
Spoke w/ via phone for pre-op interview---pt Lab needs dos----I stat               Lab results------see below COVID test -----patient states asymptomatic no test needed Arrive at -------800 am 05-20-2021 NPO after MN NO Solid Food.  Clear liquids from MN until---700 am then npo Med rec completed Medications to take morning of surgery -----amlodipine, albuterol inhaler /bring inhaler, gabapentin, wellbutrin, bystolic, omeprazole, mybetriq Diabetic medication -----n/a Patient instructed no nail polish to be worn day of surgery Patient instructed to bring photo id and insurance card day of surgery Patient aware to have Driver (ride ) / caregiver driver ravon davis spouse    for 24 hours after surgery  Patient Special Instructions -----none Pre-Op special Istructions -----none Patient verbalized understanding of instructions that were given at this phone interview. Patient denies shortness of breath, chest pain, fever, cough at this phone interview.  Pt has left ear daith piercing and nose ring she cannot remove ekg 05-01-2021 epic Chest xray 05-01-2021 epic Chest ct 03-27-2021 epic

## 2021-05-19 ENCOUNTER — Other Ambulatory Visit (HOSPITAL_COMMUNITY): Payer: Self-pay

## 2021-05-19 ENCOUNTER — Other Ambulatory Visit: Payer: Self-pay

## 2021-05-19 MED FILL — Methenamine-Hyosc-Meth Blue-Sod Phos-Phen Sal Cap 118 MG: ORAL | 7 days supply | Qty: 20 | Fill #0 | Status: AC

## 2021-05-20 ENCOUNTER — Encounter (HOSPITAL_BASED_OUTPATIENT_CLINIC_OR_DEPARTMENT_OTHER)
Admission: RE | Disposition: A | Discharge status: Home / Self Care | Payer: Self-pay | Source: Ambulatory Visit | Attending: Urology

## 2021-05-20 ENCOUNTER — Encounter (HOSPITAL_BASED_OUTPATIENT_CLINIC_OR_DEPARTMENT_OTHER): Payer: Self-pay | Admitting: Urology

## 2021-05-20 ENCOUNTER — Ambulatory Visit (HOSPITAL_BASED_OUTPATIENT_CLINIC_OR_DEPARTMENT_OTHER): Payer: No Typology Code available for payment source | Admitting: Anesthesiology

## 2021-05-20 ENCOUNTER — Other Ambulatory Visit (HOSPITAL_COMMUNITY): Payer: Self-pay

## 2021-05-20 ENCOUNTER — Other Ambulatory Visit: Payer: Self-pay

## 2021-05-20 ENCOUNTER — Ambulatory Visit (HOSPITAL_BASED_OUTPATIENT_CLINIC_OR_DEPARTMENT_OTHER)
Admission: RE | Admit: 2021-05-20 | Discharge: 2021-05-20 | Disposition: A | Discharge status: Home / Self Care | Payer: No Typology Code available for payment source | Source: Ambulatory Visit | Attending: Urology | Admitting: Urology

## 2021-05-20 DIAGNOSIS — Z888 Allergy status to other drugs, medicaments and biological substances status: Secondary | ICD-10-CM | POA: Diagnosis not present

## 2021-05-20 DIAGNOSIS — Z87891 Personal history of nicotine dependence: Secondary | ICD-10-CM | POA: Insufficient documentation

## 2021-05-20 DIAGNOSIS — Z880 Allergy status to penicillin: Secondary | ICD-10-CM | POA: Diagnosis not present

## 2021-05-20 DIAGNOSIS — C7911 Secondary malignant neoplasm of bladder: Secondary | ICD-10-CM | POA: Insufficient documentation

## 2021-05-20 DIAGNOSIS — Z7982 Long term (current) use of aspirin: Secondary | ICD-10-CM | POA: Diagnosis not present

## 2021-05-20 DIAGNOSIS — Z8616 Personal history of COVID-19: Secondary | ICD-10-CM | POA: Diagnosis not present

## 2021-05-20 DIAGNOSIS — N1330 Unspecified hydronephrosis: Secondary | ICD-10-CM | POA: Insufficient documentation

## 2021-05-20 DIAGNOSIS — Z79899 Other long term (current) drug therapy: Secondary | ICD-10-CM | POA: Insufficient documentation

## 2021-05-20 HISTORY — DX: Personal history of other diseases of the circulatory system: Z86.79

## 2021-05-20 HISTORY — DX: Family history of other specified conditions: Z84.89

## 2021-05-20 HISTORY — DX: Chronic kidney disease, stage 3 unspecified: N18.30

## 2021-05-20 HISTORY — DX: Personal history of other malignant neoplasm of skin: Z85.828

## 2021-05-20 HISTORY — DX: Adverse effect of antineoplastic and immunosuppressive drugs, initial encounter: T45.1X5A

## 2021-05-20 HISTORY — DX: Major depressive disorder, single episode, unspecified: F32.9

## 2021-05-20 HISTORY — DX: Anemia due to antineoplastic chemotherapy: D64.81

## 2021-05-20 HISTORY — PX: CYSTOSCOPY W/ URETERAL STENT PLACEMENT: SHX1429

## 2021-05-20 HISTORY — DX: Personal history of antineoplastic chemotherapy: Z92.21

## 2021-05-20 LAB — POCT I-STAT, CHEM 8
BUN: 22 mg/dL (ref 8–23)
Calcium, Ion: 1.26 mmol/L (ref 1.15–1.40)
Chloride: 104 mmol/L (ref 98–111)
Creatinine, Ser: 1.4 mg/dL — ABNORMAL HIGH (ref 0.44–1.00)
Glucose, Bld: 98 mg/dL (ref 70–99)
HCT: 27 % — ABNORMAL LOW (ref 36.0–46.0)
Hemoglobin: 9.2 g/dL — ABNORMAL LOW (ref 12.0–15.0)
Potassium: 4.1 mmol/L (ref 3.5–5.1)
Sodium: 141 mmol/L (ref 135–145)
TCO2: 24 mmol/L (ref 22–32)

## 2021-05-20 SURGERY — CYSTOSCOPY, WITH RETROGRADE PYELOGRAM AND URETERAL STENT INSERTION
Anesthesia: General | Site: Pelvis | Laterality: Bilateral

## 2021-05-20 MED ORDER — SODIUM CHLORIDE 0.9 % IR SOLN
Status: DC | PRN
  Administered 2021-05-20: 1000 mL

## 2021-05-20 MED ORDER — MIDAZOLAM HCL 2 MG/2ML IJ SOLN
INTRAMUSCULAR | Status: AC
  Filled 2021-05-20: qty 2

## 2021-05-20 MED ORDER — FENTANYL CITRATE (PF) 100 MCG/2ML IJ SOLN
INTRAMUSCULAR | Status: AC
  Filled 2021-05-20: qty 2

## 2021-05-20 MED ORDER — ONDANSETRON HCL 4 MG/2ML IJ SOLN: 4.0000 mg | Freq: Once | INTRAMUSCULAR | Status: DC | PRN

## 2021-05-20 MED ORDER — LACTATED RINGERS IV SOLN: INTRAVENOUS | Status: DC

## 2021-05-20 MED ORDER — PROPOFOL 10 MG/ML IV BOLUS
INTRAVENOUS | Status: DC | PRN
  Administered 2021-05-20: 150 mg via INTRAVENOUS

## 2021-05-20 MED ORDER — PROPOFOL 10 MG/ML IV BOLUS
INTRAVENOUS | Status: AC
  Filled 2021-05-20: qty 20

## 2021-05-20 MED ORDER — FENTANYL CITRATE (PF) 100 MCG/2ML IJ SOLN: 25.0000 ug | INTRAMUSCULAR | Status: DC | PRN

## 2021-05-20 MED ORDER — IOHEXOL 300 MG/ML  SOLN
INTRAMUSCULAR | Status: DC | PRN
  Administered 2021-05-20: 10 mL via URETHRAL

## 2021-05-20 MED ORDER — ONDANSETRON HCL 4 MG/2ML IJ SOLN
INTRAMUSCULAR | Status: AC
  Filled 2021-05-20: qty 2

## 2021-05-20 MED ORDER — OXYCODONE HCL 5 MG PO TABS
5.0000 mg | ORAL_TABLET | Freq: Once | ORAL | Status: AC | PRN
  Administered 2021-05-20: 5 mg via ORAL

## 2021-05-20 MED ORDER — AMISULPRIDE (ANTIEMETIC) 5 MG/2ML IV SOLN: 10.0000 mg | Freq: Once | INTRAVENOUS | Status: DC | PRN

## 2021-05-20 MED ORDER — LIDOCAINE 2% (20 MG/ML) 5 ML SYRINGE
INTRAMUSCULAR | Status: DC | PRN
  Administered 2021-05-20: 80 mg via INTRAVENOUS

## 2021-05-20 MED ORDER — OXYCODONE HCL 5 MG PO TABS
ORAL_TABLET | ORAL | Status: AC
  Filled 2021-05-20: qty 1

## 2021-05-20 MED ORDER — DEXAMETHASONE SODIUM PHOSPHATE 10 MG/ML IJ SOLN
INTRAMUSCULAR | Status: AC
  Filled 2021-05-20: qty 1

## 2021-05-20 MED ORDER — ONDANSETRON HCL 4 MG/2ML IJ SOLN
INTRAMUSCULAR | Status: DC | PRN
  Administered 2021-05-20: 4 mg via INTRAVENOUS

## 2021-05-20 MED ORDER — OXYBUTYNIN CHLORIDE 5 MG PO TABS
ORAL_TABLET | Freq: Three times a day (TID) | ORAL | 3 refills | Status: DC | PRN
  Filled 2021-05-20: qty 90, 30d supply, fill #0

## 2021-05-20 MED ORDER — CIPROFLOXACIN IN D5W 400 MG/200ML IV SOLN
INTRAVENOUS | Status: AC
  Filled 2021-05-20: qty 200

## 2021-05-20 MED ORDER — MIDAZOLAM HCL 5 MG/5ML IJ SOLN
INTRAMUSCULAR | Status: DC | PRN
  Administered 2021-05-20: 2 mg via INTRAVENOUS

## 2021-05-20 MED ORDER — DEXAMETHASONE SODIUM PHOSPHATE 10 MG/ML IJ SOLN
INTRAMUSCULAR | Status: DC | PRN
  Administered 2021-05-20: 10 mg via INTRAVENOUS

## 2021-05-20 MED ORDER — LIDOCAINE HCL (PF) 2 % IJ SOLN
INTRAMUSCULAR | Status: AC
  Filled 2021-05-20: qty 5

## 2021-05-20 MED ORDER — CIPROFLOXACIN IN D5W 400 MG/200ML IV SOLN
400.0000 mg | INTRAVENOUS | Status: AC
  Administered 2021-05-20: 400 mg via INTRAVENOUS

## 2021-05-20 MED ORDER — OXYCODONE HCL 5 MG/5ML PO SOLN
5.0000 mg | Freq: Once | ORAL | Status: AC | PRN
Start: 2021-05-20 — End: 2021-05-20

## 2021-05-20 MED ORDER — FENTANYL CITRATE (PF) 250 MCG/5ML IJ SOLN
INTRAMUSCULAR | Status: DC | PRN
  Administered 2021-05-20: 25 ug via INTRAVENOUS

## 2021-05-20 SURGICAL SUPPLY — 20 items
BAG DRAIN URO-CYSTO SKYTR STRL (DRAIN) ×2 IMPLANT
BAG DRN UROCATH (DRAIN) ×1
BASKET ZERO TIP NITINOL 2.4FR (BASKET) IMPLANT
BSKT STON RTRVL ZERO TP 2.4FR (BASKET)
CATH URET 5FR 28IN OPEN ENDED (CATHETERS) ×2 IMPLANT
CLOTH BEACON ORANGE TIMEOUT ST (SAFETY) ×2 IMPLANT
DRSG TEGADERM 4X4.75 (GAUZE/BANDAGES/DRESSINGS) IMPLANT
EXTRACTOR STONE 1.7FRX115CM (UROLOGICAL SUPPLIES) IMPLANT
GLOVE SURG ENC MOIS LTX SZ6.5 (GLOVE) ×2 IMPLANT
GOWN STRL REUS W/TWL LRG LVL3 (GOWN DISPOSABLE) ×2 IMPLANT
GUIDEWIRE STR DUAL SENSOR (WIRE) ×2 IMPLANT
IV NS IRRIG 3000ML ARTHROMATIC (IV SOLUTION) ×2 IMPLANT
KIT TURNOVER CYSTO (KITS) ×2 IMPLANT
MANIFOLD NEPTUNE II (INSTRUMENTS) ×2 IMPLANT
PACK CYSTO (CUSTOM PROCEDURE TRAY) ×2 IMPLANT
STENT POLARIS LOOP 6FR X 26 CM (STENTS) ×4 IMPLANT
TRACTIP FLEXIVA PULS ID 200XHI (Laser) IMPLANT
TRACTIP FLEXIVA PULSE ID 200 (Laser)
TUBE CONNECTING 12X1/4 (SUCTIONS) ×2 IMPLANT
TUBING UROLOGY SET (TUBING) ×2 IMPLANT

## 2021-05-20 NOTE — Anesthesia Procedure Notes (Signed)
Procedure Name: LMA Insertion Date/Time: 05/20/2021 10:35 AM Performed by: Myna Bright, CRNA Pre-anesthesia Checklist: Patient identified, Emergency Drugs available, Suction available and Patient being monitored Patient Re-evaluated:Patient Re-evaluated prior to induction Oxygen Delivery Method: Circle system utilized Preoxygenation: Pre-oxygenation with 100% oxygen Induction Type: IV induction Ventilation: Mask ventilation without difficulty LMA: LMA inserted LMA Size: 4.0 Number of attempts: 1 Placement Confirmation: positive ETCO2 and breath sounds checked- equal and bilateral Tube secured with: Tape Dental Injury: Teeth and Oropharynx as per pre-operative assessment

## 2021-05-20 NOTE — Anesthesia Preprocedure Evaluation (Addendum)
Anesthesia Evaluation  Patient identified by MRN, date of birth, ID band Patient awake    Reviewed: Allergy & Precautions, NPO status , Patient's Chart, lab work & pertinent test results, reviewed documented beta blocker date and time   Airway Mallampati: II  TM Distance: >3 FB Neck ROM: Full    Dental no notable dental hx. (+) Teeth Intact   Pulmonary pneumonia, resolved, former smoker,  Hx/o Covid-19 pneumonia with respiratory failure 08/13/20   Pulmonary exam normal breath sounds clear to auscultation       Cardiovascular hypertension, Pt. on medications and Pt. 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, undergoing chemotherapy    Musculoskeletal  (+) Arthritis , Osteoarthritis,    Abdominal   Peds  Hematology  (+) anemia , thrombocytopenia   Anesthesia Other Findings   Reproductive/Obstetrics                             Anesthesia Physical  Anesthesia Plan  ASA: III  Anesthesia Plan: General   Post-op Pain Management:    Induction: Intravenous  PONV Risk Score and Plan: 4 or greater and Midazolam, Ondansetron and Treatment may vary due to age or medical condition  Airway Management Planned: LMA  Additional Equipment:   Intra-op Plan:   Post-operative Plan: Extubation in OR  Informed Consent: I have reviewed the patients History and Physical, chart, labs and discussed the procedure including the risks, benefits and alternatives for the proposed anesthesia with the patient or authorized representative who has  indicated his/her understanding and acceptance.     Dental advisory given  Plan Discussed with: CRNA, Anesthesiologist and Surgeon  Anesthesia Plan Comments:        Anesthesia Quick Evaluation

## 2021-05-20 NOTE — Op Note (Signed)
Preoperative diagnosis:  1. Metastatic squamous cell carcinoma  Postoperative diagnosis:  1. Metastatic squamous cell carcinoma  Procedure:  1. Cystoscopy 2. bilateral ureteral stent placement 3. bilateral retrograde pyelography with interpretation   Surgeon: Jacalyn Lefevre, MD  Anesthesia: General  Complications: None  Intraoperative findings:  1.  Normal urethra 2.  Existing bilateral ureteral stents coming from orthotopic ureteral orifices 3.  Well-healed prior TUR scar lateral to left ureteral orifice no further evidence of bladder tumor 4.  Retrograde pyelogram demonstrated bilateral hydronephrosis of collecting system with narrowing of proximal ureter -findings consistent with last stent exchange he did not appear worse   EBL: Minimal  Specimens: None  Indication: Mary Stark is a 65 y.o. patient with metastatic squamous cell carcinoma of the bladder currently undergoing chemotherapy with bilateral hydronephrosis and mass-effect compression on proximal ureters. She is here today for stent exchange. After reviewing the management options for treatment, he elected to proceed with the above surgical procedure(s). We have discussed the potential benefits and risks of the procedure, side effects of the proposed treatment, the likelihood of the patient achieving the goals of the procedure, and any potential problems that might occur during the procedure or recuperation. Informed consent has been obtained.  Description of procedure:  The patient was taken to the operating room and general anesthesia was induced.  The patient was placed in the dorsal lithotomy position, prepped and draped in the usual sterile fashion, and preoperative antibiotics were administered. A preoperative time-out was performed.   Cystourethroscopy was performed.  The patient's urethra was examined and was normal.  The bladder was then systematically examined in its entirety. There was no evidence for any  bladder tumors, stones, or other mucosal pathology.    Attention then turned to the left ureteral orifice and graspers were used to bring the ureteral stent to the urethral meatus.  A 0.38 sensor wire then cannulated the ureteral stent and was advanced to the kidney with fluoroscopic guidance.  The stent was removed.  Next an open-ended ureteral catheter was placed over the wire into the ureter.  A retrograde pyelogram was obtained with findings noted above.  The wire was then replaced and the open-ended ureteral catheter was removed. The wire was then backloaded through the cystoscope and a ureteral stent was advance over the wire using Seldinger technique.  The stent was positioned appropriately under fluoroscopic and cystoscopic guidance.  The wire was then removed with an adequate stent curl noted in the renal pelvis as well as in the bladder.  This entire procedure was then repeated on the right side in a similar manner.  The bladder was then emptied and the procedure ended.  The patient appeared to tolerate the procedure well and without complications.  The patient was able to be awakened and transferred to the recovery unit in satisfactory condition.    Jacalyn Lefevre, M.D.

## 2021-05-20 NOTE — H&P (Signed)
CC/HPI: cc: bilateral hydronephrosis, metastatic squamous cell carcinoma    04/14/21: 65 year old woman initially seen for incidental finding of asymptomatic right hydronephrosis which then progressed to bilateral hydronephrosis and underwent diagnostic ureteroscopy x2 the last being 07/05/2020. Biopsies will also ultimately negative for malignancy and we discussed a trial of steroids to see if this was some usual inflammatory process. Repeat CT scan of the abdomen pelvis were performed today to reassess hydronephrosis and inflammation. She then underwent retroperitoneal lymph node biopsy by IR and was found to have metastatic cancer either GU or gyn primary. She began chemotherapy under the management of Dr. Alen Blew. During her last stent exchange she was noted to have a mass at the left ureteral orifice and biopsy showed squamous cell carcinoma. Patient comes in today with continued gross hematuria since the stents replaced. She did receive 2 units of packed red blood cells last month. She is tolerating stents well in spite of the blood.   Med/onc: She is status post retroperitoneal lymph node biopsy completed on 10/24/2020 which showed metastatic carcinoma. She is status post repeat cystoscopy and bilateral ureteral stent exchange as well as resection of a bladder tumor on January 21, 2021. A final pathology showed an adenocarcinoma. Chemotherapy utilizing carboplatin and gemcitabine started on November 22, 2020. She completed 6 cycles of therapy on March 06, 2021. Plan to start more treatment 04/14/21.     ALLERGIES: Metformin penicillin    MEDICATIONS: Aspirin 81 mg tablet,chewable  Myrbetriq 25 mg tablet, extended release 24 hr 1 tablet PO Daily  Oxybutynin Chloride 5 mg tablet 1 tablet PO Q 8 H PRN  Tamsulosin Hcl 0.4 mg capsule 1 capsule PO Q HS  Abilify  Bystolic  Hydrocodone-Acetaminophen 5 mg-325 mg tablet 1 tablet PO Q 6 H PRN flank pain  Klonopin  Neurontin  Norvasc  Wellbutrin Sr      GU PSH: Cysto Uretero Biopsy Fulgura, Bilateral - 07/05/2020, Right - 06/11/2020 Cystoscopy Insert Stent, Bilateral - 01/21/2021, Bilateral - 07/05/2020, Bilateral - 06/11/2020 Cystoscopy TURBT <2 cm - 01/21/2021 Locm 300-399Mg /Ml Iodine,1Ml - 09/09/2020, 05/02/2020     NON-GU PSH: Hand/finger Surgery Tonsillectomy     GU PMH: Hydronephrosis - 02/18/2021, CT reveals worsening left hydro and enhancing material in right renal pelvis concerning for malignancy. Overall she also has increasing lymphadenopathy in retroperitoneum as well as edema in sacral/peri-rectal regions. The patient recently completed steroids with taper and also had COVID still on O2. We discussed the findings and lack of certainty if this is malignancy vs inflammatory. Will see if IR can biopsy retroperitoneal LAD. Called patient and discussed this In detail. Stents to remain in place for now. , - 09/09/2020, Based on noncontrast CT the abdomen pelvis is difficult to further evaluate ureters. We discussed obtaining a BMP to look at renal function and proceeding with a CT urogram. If hydronephrosis has resolved then no further investigating will be done however if hydronephrosis is still present we did discuss proceeding with cystoscopy, retrograde pyelogram and diagnostic ureteroscopy. I will follow-up the patient after the CT scan., - 06/08/9484 Right uncertain neoplasm of kidney - 09/09/2020 Flank Pain - 06/04/2020    NON-GU PMH: Anxiety Arthritis Depression Encounter for general adult medical examination without abnormal findings, Encounter for preventive health examination Hepatitis C Hypercholesterolemia Hypertension    FAMILY HISTORY: Kidney Stones - Grandfather Protein S Deficiency - Brother   SOCIAL HISTORY: Marital Status: Married Preferred Language: English; Ethnicity: Not Hispanic Or Latino; Race: White Current Smoking Status: Patient does not  smoke anymore.   Tobacco Use Assessment Completed: Used Tobacco in last 30  days? Social Drinker.  Drinks 4+ caffeinated drinks per day.    REVIEW OF SYSTEMS:    GU Review Female:   Patient denies frequent urination, hard to postpone urination, burning /pain with urination, get up at night to urinate, leakage of urine, stream starts and stops, trouble starting your stream, have to strain to urinate, and being pregnant.  Gastrointestinal (Upper):   Patient denies nausea, vomiting, and indigestion/ heartburn.  Gastrointestinal (Lower):   Patient denies diarrhea and constipation.  Constitutional:   Patient denies fatigue, night sweats, weight loss, and fever.  Skin:   Patient denies skin rash/ lesion and itching.  Eyes:   Patient denies blurred vision and double vision.  Ears/ Nose/ Throat:   Patient denies sore throat and sinus problems.  Hematologic/Lymphatic:   Patient denies swollen glands and easy bruising.  Cardiovascular:   Patient denies leg swelling and chest pains.  Respiratory:   Patient denies cough and shortness of breath.  Endocrine:   Patient denies excessive thirst.  Musculoskeletal:   Patient denies back pain and joint pain.  Neurological:   Patient denies headaches and dizziness.  Psychologic:   Patient denies depression and anxiety.   VITAL SIGNS: None   MULTI-SYSTEM PHYSICAL EXAMINATION:    Constitutional: Well-nourished. No physical deformities. Normally developed. Good grooming.  Neck: Neck symmetrical, not swollen. Normal tracheal position.  Respiratory: No labored breathing, no use of accessory muscles.   Skin: No paleness, no jaundice, no cyanosis. No lesion, no ulcer, no rash.  Neurologic / Psychiatric: Oriented to time, oriented to place, oriented to person. No depression, no anxiety, no agitation.  Gastrointestinal: No rigidity  Eyes: Normal conjunctivae. Normal eyelids.  Ears, Nose, Mouth, and Throat: Left ear no scars, no lesions, no masses. Right ear no scars, no lesions, no masses. Nose no scars, no lesions, no masses. Normal  hearing. Normal lips.  Musculoskeletal: Normal gait and station of head and neck.     Complexity of Data:  Records Review:   Previous Doctor Records, Previous Patient Records, POC Tool  Urine Test Review:   Urinalysis  X-Ray Review: C.T. Abdomen/Pelvis: Reviewed Films. Discussed With Patient.    Notes:                     CT 03/27/21:  IMPRESSION:  1. Interval enlargement of bulky retroperitoneal lymphadenopathy.  Findings are consistent with worsened nodal metastatic disease.  2. Redemonstrated bilateral double-J ureteral stents with formed  pigtails in the renal pelves and urinary bladder. Unchanged,  moderate left hydronephrosis.  3. There is stranded soft tissue within the right renal pelvis and  about the right ureter, consistent with urothelial malignancy and  not significantly changed in appearance. Additional stranded tissue  about the left ureter.  4. The bladder itself is unremarkable. Polypoid mucosal thickening  noted on prior examination is not appreciated on current study and  of uncertain significance.  5. Unchanged 5 mm nodule of the dependent right lower lobe,  nonspecific, an isolated manifestation of pulmonary metastatic  disease not generally favored. Continued attention on follow-up.  6. There is peripheral irregular and ground-glass opacity throughout  the lungs, bibasilar predominant with arcing, bandlike subpleural  elements and subpleural sparing. This specific chronic organizing  pneumonia appearance is consistent with mild pulmonary fibrosis  related to prior COVID pneumonia.   PROCEDURES:          Urinalysis w/Scope Dipstick Dipstick  Cont'd Micro  Color: Red Bilirubin: Neg mg/dL WBC/hpf: 0 - 5/hpf  Appearance: Cloudy Ketones: Neg mg/dL RBC/hpf: >60/hpf  Specific Gravity: 1.020 Blood: 3+ ery/uL Bacteria: Few (10-25/hpf)  pH: 6.0 Protein: 2+ mg/dL Cystals: NS (Not Seen)  Glucose: Neg mg/dL Urobilinogen: 0.2 mg/dL Casts: NS (Not Seen)    Nitrites: Neg  Trichomonas: Not Present    Leukocyte Esterase: 2+ leu/uL Mucous: Not Present      Epithelial Cells: NS (Not Seen)      Yeast: NS (Not Seen)      Sperm: Not Present    Notes: Unspun micro due to color/clarity    ASSESSMENT:      ICD-10 Details  1 GU:   Hydronephrosis - N13.0 Chronic, Stable  2 NON-GU:   Metastatic lymphadenopathy of multiple regions - C77.8 Chronic, Stable   PLAN:           Orders Labs CULTURE, URINE          Document Letter(s):  Created for Patient: Clinical Summary         Notes:   Patient to have CBC done tomorrow prior to starting next chemotherapy regimen. I discussed with her that if she continues to have gross hematuria and requiring blood transfusions we could consider switching to bilateral PCN tubes. Patient is to be scheduled for stent exchange in June 2022. She does not wish to proceed with PCN tubes if she can avoid it. I have also reached out to Dr. Alen Blew with plan.

## 2021-05-20 NOTE — Discharge Instructions (Signed)
Post stent placement instructions   Definitions:  Ureter: The duct that transports urine from the kidney to the bladder. Stent: A plastic hollow tube that is placed into the ureter, from the kidney to the bladder to prevent the ureter from swelling shut.  General instructions:  Despite the fact that no skin incisions were used, the area around the ureter and bladder is raw and irritated. The stent is a foreign body which can further irritate the bladder wall. This irritation is manifested by increased frequency of urination, both day and night, and by an increase in the urge to urinate. In some, the urge to urinate is present almost always. Sometimes the urge is strong enough that you may not be able to stop your self from urinating. This can often be controlled with medication but does not occur in everyone. A stent can safely be left in place for 3 months or greater.  You may see some blood in your urine while the stent is in place and a few days afterward. Do not be alarmed, even if the urine is clear for a while. Get off your feet and drink lots of fluids until clearing occurs. If you start to pass clots or don't improve, call us.  Diet:  You may return to your normal diet immediately. Because of the raw surface of your bladder, alcohol, spicy foods, foods high in acid and drinks with caffeine may cause irritation or frequency and should be used in moderation. To keep your urine flowing freely and avoid constipation, drink plenty of fluids during the day (8-10 glasses). Tip: Avoid cranberry juice because it is very acidic.  Activity:  Your physical activity doesn't need to be restricted. However, if you are very active, you may see some blood in the urine. We suggest that you reduce your activity under the circumstances until the bleeding has stopped.  Bowels:  It is important to keep your bowels regular during the postoperative period. Straining with bowel movements can cause bleeding. A  bowel movement every other day is reasonable. Use a mild laxative if needed, such as milk of magnesia 2-3 tablespoons, or 2 Dulcolax tablets. Call if you continue to have problems. If you had been taking narcotics for pain, before, during or after your surgery, you may be constipated. Take a laxative if necessary.  Medication:  You should resume your pre-surgery medications unless told not to. In addition you may be given an antibiotic to prevent or treat infection. Antibiotics are not always necessary. All medication should be taken as prescribed until the bottles are finished unless you are having an unusual reaction to one of the drugs.  Problems you should report to us:  a. Fever greater than 101F. b. Heavy bleeding, or clots (see notes above about blood in urine). c. Inability to urinate. d. Drug reactions (hives, rash, nausea, vomiting, diarrhea). e. Severe burning or pain with urination that is not improving.       Post Anesthesia Home Care Instructions  Activity: Get plenty of rest for the remainder of the day. A responsible individual must stay with you for 24 hours following the procedure.  For the next 24 hours, DO NOT: -Drive a car -Operate machinery -Drink alcoholic beverages -Take any medication unless instructed by your physician -Make any legal decisions or sign important papers.  Meals: Start with liquid foods such as gelatin or soup. Progress to regular foods as tolerated. Avoid greasy, spicy, heavy foods. If nausea and/or vomiting occur, drink only clear   and/or vomiting subsides. Call your physician if vomiting continues.  Special Instructions/Symptoms: Your throat may feel dry or sore from the anesthesia or the breathing tube placed in your throat during surgery. If this causes discomfort, gargle with warm salt water. The discomfort should disappear within 24 hours.  If you had a scopolamine patch placed behind your ear for the management of  post- operative nausea and/or vomiting:  1. The medication in the patch is effective for 72 hours, after which it should be removed.  Wrap patch in a tissue and discard in the trash. Wash hands thoroughly with soap and water. 2. You may remove the patch earlier than 72 hours if you experience unpleasant side effects which may include dry mouth, dizziness or visual disturbances. 3. Avoid touching the patch. Wash your hands with soap and water after contact with the patch.

## 2021-05-20 NOTE — Anesthesia Postprocedure Evaluation (Signed)
Anesthesia Post Note  Patient: Mary Stark  Procedure(s) Performed: CYSTOSCOPY WITH RETROGRADE PYELOGRAM/URETERAL STENT EXCHANGE (Bilateral Pelvis)     Patient location during evaluation: PACU Anesthesia Type: General Level of consciousness: awake and alert Pain management: pain level controlled Vital Signs Assessment: post-procedure vital signs reviewed and stable Respiratory status: spontaneous breathing, nonlabored ventilation and respiratory function stable Cardiovascular status: blood pressure returned to baseline and stable Postop Assessment: no apparent nausea or vomiting Anesthetic complications: no   No complications documented.  Last Vitals:  Vitals:   05/20/21 1130 05/20/21 1145  BP: 131/77 129/82  Pulse: 62 63  Resp: 15 20  Temp:    SpO2: 99% 99%    Last Pain:  Vitals:   05/20/21 1130  TempSrc:   PainSc: 0-No pain                 Merlinda Frederick

## 2021-05-20 NOTE — Transfer of Care (Signed)
Immediate Anesthesia Transfer of Care Note  Patient: Mary Stark  Procedure(s) Performed: CYSTOSCOPY WITH RETROGRADE PYELOGRAM/URETERAL STENT EXCHANGE (Bilateral Pelvis)  Patient Location: PACU  Anesthesia Type:General  Level of Consciousness: awake, alert , oriented and patient cooperative  Airway & Oxygen Therapy: Patient Spontanous Breathing and Patient connected to face mask oxygen  Post-op Assessment: Report given to RN, Post -op Vital signs reviewed and stable and Patient moving all extremities  Post vital signs: Reviewed and stable  Last Vitals:  Vitals Value Taken Time  BP 124/79 05/20/21 1104  Temp    Pulse 64 05/20/21 1109  Resp 15 05/20/21 1109  SpO2 98 % 05/20/21 1109  Vitals shown include unvalidated device data.  Last Pain:  Vitals:   05/20/21 0835  TempSrc: Oral  PainSc: 3       Patients Stated Pain Goal: 3 (53/01/04 0459)  Complications: No complications documented.

## 2021-05-20 NOTE — Interval H&P Note (Signed)
History and Physical Interval Note:  05/20/2021 9:34 AM  Mary Stark  has presented today for surgery, with the diagnosis of BILATERAL HYDRONEPHROSIS.  The various methods of treatment have been discussed with the patient and family. After consideration of risks, benefits and other options for treatment, the patient has consented to  Procedure(s): CYSTOSCOPY WITH RETROGRADE PYELOGRAM/URETERAL STENT EXCHANGE (Bilateral) as a surgical intervention.  The patient's history has been reviewed, patient examined, no change in status, stable for surgery.  I have reviewed the patient's chart and labs.  Questions were answered to the patient's satisfaction.     Robbie Rideaux D Lavender Stanke

## 2021-05-21 ENCOUNTER — Encounter (HOSPITAL_BASED_OUTPATIENT_CLINIC_OR_DEPARTMENT_OTHER): Payer: Self-pay | Admitting: Urology

## 2021-05-22 ENCOUNTER — Other Ambulatory Visit: Payer: Self-pay

## 2021-05-22 ENCOUNTER — Ambulatory Visit (INDEPENDENT_AMBULATORY_CARE_PROVIDER_SITE_OTHER): Payer: No Typology Code available for payment source | Admitting: Osteopathic Medicine

## 2021-05-22 ENCOUNTER — Other Ambulatory Visit (HOSPITAL_COMMUNITY): Payer: Self-pay

## 2021-05-22 ENCOUNTER — Telehealth: Payer: Self-pay | Admitting: Osteopathic Medicine

## 2021-05-22 VITALS — BP 110/67 | HR 89 | Temp 99.8°F | Wt 185.1 lb

## 2021-05-22 DIAGNOSIS — Z0000 Encounter for general adult medical examination without abnormal findings: Secondary | ICD-10-CM

## 2021-05-22 DIAGNOSIS — N390 Urinary tract infection, site not specified: Secondary | ICD-10-CM

## 2021-05-22 DIAGNOSIS — I959 Hypotension, unspecified: Secondary | ICD-10-CM

## 2021-05-22 DIAGNOSIS — R5383 Other fatigue: Secondary | ICD-10-CM

## 2021-05-22 DIAGNOSIS — N179 Acute kidney failure, unspecified: Secondary | ICD-10-CM

## 2021-05-22 DIAGNOSIS — Z09 Encounter for follow-up examination after completed treatment for conditions other than malignant neoplasm: Secondary | ICD-10-CM

## 2021-05-22 MED ORDER — CIPROFLOXACIN HCL 500 MG PO TABS: 500.0000 mg | ORAL_TABLET | Freq: Two times a day (BID) | ORAL | 0 refills | Status: AC

## 2021-05-22 NOTE — Progress Notes (Signed)
Mary Stark is a 65 y.o. female who presents to  [facility]  today, 05/22/21, seeking care for the following:  Initially on scehdule for annual  Concerned about feeling weak and low BP - recent stent exchange 2 days ago, urology is treating hydronephrosis and bladder cancer. PT is feeling significant fatigue today, mild back pain, no hematuria/dysuria.      ASSESSMENT & PLAN with other pertinent findings:  The primary encounter diagnosis was Annual physical exam. Diagnoses of Hypotension, unspecified hypotension type, Other fatigue, Surgery follow-up examination, Acute kidney injury ([place]), and Complicated UTI (urinary tract infection) were also pertinent to this visit.   1. Annual physical exam Ee preventive care reviewed as below  2. Hypotension, unspecified hypotension type Not dangerously low, but will HOLD antihypertensives Norvasc, Bystolic, Lasix for now   3. Other fatigue 4. POD #2 s/p ureteral stent exchange Advised contact her urology team, will forward this note to Dr Claudia Desanctis   5. Acute kidney injury ([place]) Pt tolerating PO, advised PUSH FLUIDS, AKI likely due to UTI / recent surgery but pt knows to go to ER if worse  Rechecking renal fxn w/ stat labs first thing tomorrow morning, pt aware and agrees to plan   6. Complicated UTI (urinary tract infection) Renal dose Cipro sent     Orders Placed This Encounter  Procedures   Urine Culture   CBC with Differential/Platelet   COMPLETE METABOLIC PANEL WITH GFR   Magnesium   Urinalysis   Microalbumin / creatinine urine ratio    Meds ordered this encounter  Medications   ciprofloxacin (CIPRO) 500 MG tablet    Sig: Take 1 tablet (500 mg total) by mouth 2 (two) times daily for 5 days.    Dispense:  10 tablet    Refill:  0     See below for relevant physical exam findings  See below for recent lab and imaging results reviewed  Medications, allergies, PMH,  PSH, SocH, FamH reviewed below    Follow-up instructions: Return for RECHECK PENDING RESULTS / IF WORSE OR CHANGE.                                        Exam:  BP 110/67 (BP Location: Left Arm, Patient Position: Sitting, Cuff Size: Normal)   Pulse 89   Temp 99.8 F (37.7 C) (Oral)   Wt 185 lb 1.9 oz (84 kg)   BMI 29.88 kg/m  Constitutional: VS see above. General Appearance: alert, well-developed, well-nourished, NAD Neck: No masses, trachea midline.  Respiratory: Normal respiratory effort. no wheeze, no rhonchi, no rales Cardiovascular: S1/S2 normal, no murmur, no rub/gallop auscultated. RRR.  Musculoskeletal: Gait normal. Symmetric and independent movement of all extremities Abdominal: non-tender, non-distended, no appreciable organomegaly, neg Murphy's, BS WNLx4 Neurological: Normal balance/coordination. No tremor. Skin: warm, dry, intact.  Psychiatric: Normal judgment/insight. Normal mood and affect. Oriented x3.   Current Meds  Medication Sig   acetaminophen (TYLENOL) 500 MG tablet Take 1,000 mg by mouth every 8 (eight) hours as needed for moderate pain.    albuterol (PROAIR HFA) 108 (90 Base) MCG/ACT inhaler INHALE 1 TO 2 PUFFS BY MOUTH INTO THE LUNGS EVERY 4 HOURS AS NEEDED FOR WHEEZING OR SHORTNESS OF BREATH (Patient taking differently: Inhale 1-2 puffs into the lungs every 4 (four) hours as needed for wheezing or shortness of breath. INHALE  1 TO 2 PUFFS BY MOUTH INTO THE LUNGS EVERY 4 HOURS AS NEEDED FOR WHEEZING OR SHORTNESS OF BREATH)   amLODipine (NORVASC) 5 MG tablet TAKE 1 TABLET BY MOUTH ONCE A DAY (NEEDS OFFICE VISIT)   ARIPiprazole 10 MG TABS Take 10 mg by mouth daily. (Patient taking differently: Take 10 mg by mouth at bedtime. abilify)   ascorbic acid (VITAMIN C) 500 MG tablet Take 1,000 mg by mouth every evening.    aspirin 81 MG EC tablet TAKE 1 TABLET BY MOUTH DAILY. (Patient taking differently: Take 81 mg by mouth daily. Not  taking since 05-01-2021 due to nasal irritation)   buPROPion (WELLBUTRIN XL) 300 MG 24 hr tablet TAKE 1 TABLET BY MOUTH EVERY DAY (Patient taking differently: Take 300 mg by mouth daily.)   Cholecalciferol (VITAMIN D) 125 MCG (5000 UT) CAPS Take 5,000 Units by mouth every evening.    ciprofloxacin (CIPRO) 500 MG tablet Take 1 tablet (500 mg total) by mouth 2 (two) times daily for 5 days.   clonazePAM (KLONOPIN) 0.5 MG tablet TAKE 1 TABLET BY MOUTH AT BEDTIME   Coenzyme Q10 (CO Q-10) 200 MG CAPS Take 200 mg by mouth every evening.    fexofenadine (ALLEGRA) 180 MG tablet Take 180 mg by mouth every evening.    furosemide (LASIX) 20 MG tablet TAKE 1 TABLET BY MOUTH ONCE A DAY   gabapentin (NEURONTIN) 300 MG capsule TAKE 1 CAPSULE BY MOUTH IN THE MORNING, 1 CAP IN THE AFTERNOON & 4 CAPS AT NIGHT   GLUCOSAMINE-CHONDROITIN PO Take 2 tablets by mouth every evening.    guaiFENesin-dextromethorphan (ROBITUSSIN DM) 100-10 MG/5ML syrup Take 10 mLs by mouth every 4 (four) hours as needed for cough.   lidocaine-prilocaine (EMLA) cream Apply 1 application topically as needed.   Meth-Hyo-M Bl-Na Phos-Ph Sal (URO-MP) 118 MG CAPS TAKE 1 CAPSULE BY MOUTH 3 TIMES DAILY AS NEEDED WITH A FULL GLASS OF WATER   mirabegron ER (MYRBETRIQ) 25 MG TB24 tablet TAKE 1 TABLET BY MOUTH ONCE A DAY   Nebivolol HCl 20 MG TABS TAKE 1 TABLET BY MOUTH DAILY. (Patient taking differently: Take 1 tablet by mouth daily. bystoloic)   nitrofurantoin (MACRODANTIN) 100 MG capsule Take 1 capsule (100 mg total) by mouth at bedtime.   Omega-3 Fatty Acids (FISH OIL) 1200 MG CAPS Take 1,200 mg by mouth every evening.    omeprazole (PRILOSEC) 20 MG capsule TAKE 1 CAPSULE BY MOUTH ONCE A DAY * NEEDS APPT FOR REFILLS   ondansetron (ZOFRAN) 4 MG tablet Take 1 tablet (4 mg total) by mouth every 8 (eight) hours as needed for nausea or vomiting.   oxybutynin (DITROPAN) 5 MG tablet TAKE 1 TABLET BY MOUTH EVERY 8 HOURS AS NEEDED   oxymetazoline (AFRIN) 0.05  % nasal spray Place 1 spray into both nostrils 2 (two) times daily.   Phenazopyridine HCl (AZO URINARY PAIN PO) Take by mouth as needed.   simvastatin (ZOCOR) 20 MG tablet TAKE 1 TABLET (20 MG TOTAL) BY MOUTH DAILY. (Patient taking differently: Take by mouth at bedtime.)   tamsulosin (FLOMAX) 0.4 MG CAPS capsule TAKE 1 CAPSULE BY MOUTH AT BEDTIME   thiamine 100 MG tablet Take 1 tablet (100 mg total) by mouth daily.   vitamin B-12 (CYANOCOBALAMIN) 1000 MCG tablet Take 2,000 mcg by mouth every evening.    Vitamin E 180 MG (400 UNIT) CAPS Take 400 Units by mouth every evening.     Allergies  Allergen Reactions   Metformin And Related Other (  See Comments)    Lactic acidosis    Penicillins Hives and Rash    Reports hives to penicillin and amoxicillin as an adult "a long time ago." Has tolerated cefdinir (09/2020) and cefepime (10/2020) before.    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   Malignancy ([place]) 10/31/2020   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   Sepsis secondary to UTI ([place]) 10/22/2020   Normocytic anemia 10/22/2020   Hyperproteinemia 10/22/2020   Hydronephrosis 10/01/2020   Proteinuria 10/01/2020   Abnormal CT of the abdomen 10/01/2020   Bilateral lower extremity edema 09/12/2020   Acute hypoxemic respiratory failure due to COVID-19 ([place]) 08/19/2020   Tear of medial meniscus of left knee, current 07/13/2019   Internal derangement of knee involving posterior horn of lateral meniscus, left 07/13/2019   Primary osteoarthritis of both knees 07/13/2019   Renal insufficiency 07/13/2019    Anxiety about health 03/22/2019   Hypokalemia 03/21/2019   Essential hypertension 02/13/2019   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/2018   Anemia due to blood loss, acute 04/14/2018   Hypomagnesemia 04/14/2018   Hypocalcemia 04/14/2018   Tobacco abuse 04/08/2018   Depression 04/07/2018   AKI (acute kidney injury) ([place]) 04/07/2018   Sepsis ([place]) 04/07/2018   Acute colitis 04/07/2018   Avascular necrosis of humeral head, right ([place]) 03/30/2018   NSAID long-term use 03/30/2018   Goals of care, counseling/discussion 03/30/2018   Seborrheic keratoses 03/15/2018   History of nonmelanoma skin cancer 03/02/2018   Skin lesion of chest wall 03/02/2018   Alcohol dependence with unspecified alcohol-induced disorder ([place]) 02/11/2018   Severe episode of recurrent major depressive disorder, without psychotic features ([place]) 02/11/2018   Transaminitis 01/06/2018   Nicotine dependence 01/06/2018   Heavy alcohol consumption 01/06/2018   Encounter for monitoring statin therapy 01/06/2018   Class 1 obesity due to excess calories with serious comorbidity in adult 01/06/2018   Chronic pain syndrome 04/21/2017   Chronic right shoulder pain 07/03/2015   Vaginismus 04/12/2014   Menopausal state 11/08/2013   Family history of ovarian cancer 09/27/2013   Postmenopausal atrophic vaginitis 09/12/2013   Personal history of colonic polyps 07/18/2013   Dyslipidemia, goal LDL below 100 07/17/2013   Depression with anxiety 07/17/2013   Hx of hepatitis C 07/17/2013   Vitamin D deficiency 07/17/2013   Right lumbar radiculopathy 07/17/2013   History of alcohol dependence ([place]) 07/17/2013   H/O: rheumatic fever 06/30/2012   Insomnia 06/30/2012   Personal history of other infectious and parasitic diseases 06/30/2012    Family History  Problem Relation Age of Onset   Ovarian cancer Mother    Cardiomyopathy Father    Valvular heart disease Father    Liver cancer Brother    Protein C deficiency Brother    Protein C deficiency  Brother    Stroke Brother     Social History   Tobacco Use  Smoking Status Former   Packs/day: 1.00   Years: 25.00   Pack years: 25.00   Types: Cigarettes   Quit date: 2012   Years since quitting: 10.4  Smokeless Tobacco Never    Past Surgical History:  Procedure Laterality Date   ANAL  RECTAL MANOMETRY N/A 01/31/2020   Procedure: ANO RECTAL MANOMETRY;  Surgeon: Arta Silence, MD;  Location: [facility];  Service: Endoscopy;  Laterality: N/A;   CESAREAN SECTION  1984   CYSTOSCOPY W/ URETERAL STENT PLACEMENT Bilateral 01/21/2021   Procedure: CYSTOSCOPY WITH STENT REPLACEMENT;  Surgeon: Robley Fries, MD;  Location: [facility];  Service: Urology;  Laterality: Bilateral;  1 HR   CYSTOSCOPY W/ URETERAL STENT PLACEMENT Bilateral 05/20/2021   Procedure: CYSTOSCOPY WITH RETROGRADE PYELOGRAM/URETERAL STENT EXCHANGE;  Surgeon: Robley Fries, MD;  Location: [facility];  Service: Urology;  Laterality: Bilateral;   CYSTOSCOPY WITH RETROGRADE PYELOGRAM, URETEROSCOPY AND STENT PLACEMENT Bilateral 06/11/2020   Procedure: CYSTOSCOPY WITH RETROGRADE PYELOGRAM, URETEROSCOPY AND STENT PLACEMENT, RIGHT URETERAL BIOPSY;  Surgeon: Robley Fries, MD;  Location: [facility];  Service: Urology;  Laterality: Bilateral;  36 MINS   CYSTOSCOPY WITH RETROGRADE PYELOGRAM, URETEROSCOPY AND STENT PLACEMENT Bilateral 07/05/2020   Procedure: CYSTOSCOPY WITH RETROGRADE PYELOGRAM, URETEROSCOPY,  BIOPSIES AND STENT EXCHANGES;  Surgeon: Robley Fries, MD;  Location: [facility];  Service: Urology;  Laterality: Bilateral;   FINGER SURGERY Right    5th   GYNECOLOGIC CRYOSURGERY  YRS AGO   IR IMAGING GUIDED PORT INSERTION  11/18/2020   TONSILLECTOMY  AS CHILD   TRANSURETHRAL RESECTION OF BLADDER TUMOR  01/21/2021   Procedure: TRANSURETHRAL RESECTION OF BLADDER TUMOR (TURBT);  Surgeon: Robley Fries, MD;  Location: [facility];  Service: Urology;;     No results found.  Immunization History  Administered Date(s) Administered   Influenza,inj,Quad PF,6+ Mos 11/13/2020   Influenza-Unspecified 09/13/2018, 09/13/2019   PFIZER(Purple Top)SARS-COV-2 Vaccination 12/11/2019, 01/01/2020, 12/12/2020   Tdap 10/31/2015   General Preventive Care Most recent routine screening labs: I think you're getting enough labs!  Blood pressure goal 130/80 or less.  Tobacco: don't!  Alcohol: responsible moderation is ok for most adults - if you have concerns about your alcohol intake, please talk to me!  Exercise: as tolerated to reduce risk of cardiovascular disease and diabetes. Strength training will also prevent osteoporosis.  Mental health: if need for mental health care (medicines, counseling, other), or concerns about moods, please let me know!  Sexual / Reproductive health: if need for STD testing, or if concerns with libido/pain problems, please let me know!  Advanced Directive: Living Will and/or Healthcare Power of Attorney recommended for all adults, regardless of age or health.  Vaccines Flu vaccine: for almost everyone, every fall.  Shingles vaccine: after age 81.  Pneumonia vaccines:  Tetanus booster: every 10 years, due 2026 COVID vaccine: THANKS for getting your vaccines! :)  Cancer screenings  Colon cancer screening: Colonoscopy 01/2025 Breast cancer screening: mammogram annually after age 37.  Cervical cancer screening: Pap every 3 to 5 years - may be due  Lung cancer screening: CT chest every year  Infection screenings  HIV: recommended screening at least once age 46-65. Gonorrhea/Chlamydia: screening as needed Hepatitis C: recommended once for everyone age 86-76 TB: certain at-risk populations   All questions at time of visit were answered - patient instructed to contact office with any additional concerns or updates. ER/RTC precautions were reviewed with the patient as applicable.   Please note: manual typing as well as  voice recognition software may have been used to produce this document - typos may escape review. Please contact Dr. Sheppard Coil for any needed clarifications.   Total encounter time on date of service, 05/22/21, was 40 minutes spent addressing problems/issues  as noted above in [place], including time spent in discussion with patient regarding the HPI, ROS, confirming history, reviewing Assessment & Plan, as well as time spent on coordination of care, record review.

## 2021-05-22 NOTE — Telephone Encounter (Addendum)
Task completed. Cipro rx sent to [facility] as per pt's request. Please remind patient to come in tomorrow for additional labs, first thing in the morning. Thanks in advance.

## 2021-05-22 NOTE — Telephone Encounter (Signed)
Pt called.  She wants her Cipro called into [facility] on [address] in [place].  Thank you.

## 2021-05-23 ENCOUNTER — Emergency Department (HOSPITAL_COMMUNITY): Payer: No Typology Code available for payment source

## 2021-05-23 ENCOUNTER — Encounter (HOSPITAL_COMMUNITY): Payer: Self-pay

## 2021-05-23 ENCOUNTER — Telehealth: Payer: Self-pay | Admitting: Oncology

## 2021-05-23 ENCOUNTER — Inpatient Hospital Stay (HOSPITAL_COMMUNITY)
Admission: EM | Admit: 2021-05-23 | Discharge: 2021-05-27 | DRG: 872 | Disposition: A | Discharge status: Home / Self Care | Payer: No Typology Code available for payment source | Attending: Internal Medicine | Admitting: Internal Medicine

## 2021-05-23 DIAGNOSIS — A4151 Sepsis due to Escherichia coli [E. coli]: Principal | ICD-10-CM | POA: Diagnosis present

## 2021-05-23 DIAGNOSIS — N179 Acute kidney failure, unspecified: Secondary | ICD-10-CM | POA: Diagnosis present

## 2021-05-23 DIAGNOSIS — Z9221 Personal history of antineoplastic chemotherapy: Secondary | ICD-10-CM

## 2021-05-23 DIAGNOSIS — I129 Hypertensive chronic kidney disease with stage 1 through stage 4 chronic kidney disease, or unspecified chronic kidney disease: Secondary | ICD-10-CM | POA: Diagnosis present

## 2021-05-23 DIAGNOSIS — K219 Gastro-esophageal reflux disease without esophagitis: Secondary | ICD-10-CM | POA: Diagnosis present

## 2021-05-23 DIAGNOSIS — Z66 Do not resuscitate: Secondary | ICD-10-CM | POA: Diagnosis present

## 2021-05-23 DIAGNOSIS — E871 Hypo-osmolality and hyponatremia: Secondary | ICD-10-CM | POA: Diagnosis present

## 2021-05-23 DIAGNOSIS — I10 Essential (primary) hypertension: Secondary | ICD-10-CM

## 2021-05-23 DIAGNOSIS — Z20822 Contact with and (suspected) exposure to covid-19: Secondary | ICD-10-CM | POA: Diagnosis present

## 2021-05-23 DIAGNOSIS — R197 Diarrhea, unspecified: Secondary | ICD-10-CM | POA: Diagnosis present

## 2021-05-23 DIAGNOSIS — E785 Hyperlipidemia, unspecified: Secondary | ICD-10-CM | POA: Diagnosis present

## 2021-05-23 DIAGNOSIS — D6959 Other secondary thrombocytopenia: Secondary | ICD-10-CM | POA: Diagnosis present

## 2021-05-23 DIAGNOSIS — Z880 Allergy status to penicillin: Secondary | ICD-10-CM

## 2021-05-23 DIAGNOSIS — Z888 Allergy status to other drugs, medicaments and biological substances status: Secondary | ICD-10-CM

## 2021-05-23 DIAGNOSIS — R6520 Severe sepsis without septic shock: Secondary | ICD-10-CM | POA: Diagnosis present

## 2021-05-23 DIAGNOSIS — N10 Acute tubulo-interstitial nephritis: Secondary | ICD-10-CM

## 2021-05-23 DIAGNOSIS — A419 Sepsis, unspecified organism: Secondary | ICD-10-CM | POA: Diagnosis present

## 2021-05-23 DIAGNOSIS — E876 Hypokalemia: Secondary | ICD-10-CM | POA: Diagnosis present

## 2021-05-23 DIAGNOSIS — F32A Depression, unspecified: Secondary | ICD-10-CM | POA: Diagnosis present

## 2021-05-23 DIAGNOSIS — N1832 Chronic kidney disease, stage 3b: Secondary | ICD-10-CM | POA: Diagnosis present

## 2021-05-23 DIAGNOSIS — N3001 Acute cystitis with hematuria: Secondary | ICD-10-CM | POA: Diagnosis present

## 2021-05-23 DIAGNOSIS — M1990 Unspecified osteoarthritis, unspecified site: Secondary | ICD-10-CM | POA: Diagnosis present

## 2021-05-23 DIAGNOSIS — Z8619 Personal history of other infectious and parasitic diseases: Secondary | ICD-10-CM

## 2021-05-23 DIAGNOSIS — J302 Other seasonal allergic rhinitis: Secondary | ICD-10-CM | POA: Diagnosis present

## 2021-05-23 DIAGNOSIS — N390 Urinary tract infection, site not specified: Secondary | ICD-10-CM | POA: Diagnosis not present

## 2021-05-23 DIAGNOSIS — F419 Anxiety disorder, unspecified: Secondary | ICD-10-CM | POA: Diagnosis present

## 2021-05-23 DIAGNOSIS — Z85828 Personal history of other malignant neoplasm of skin: Secondary | ICD-10-CM

## 2021-05-23 DIAGNOSIS — Z79899 Other long term (current) drug therapy: Secondary | ICD-10-CM

## 2021-05-23 DIAGNOSIS — Z7982 Long term (current) use of aspirin: Secondary | ICD-10-CM

## 2021-05-23 DIAGNOSIS — D630 Anemia in neoplastic disease: Secondary | ICD-10-CM | POA: Diagnosis present

## 2021-05-23 DIAGNOSIS — C679 Malignant neoplasm of bladder, unspecified: Secondary | ICD-10-CM | POA: Diagnosis present

## 2021-05-23 DIAGNOSIS — F17290 Nicotine dependence, other tobacco product, uncomplicated: Secondary | ICD-10-CM | POA: Diagnosis present

## 2021-05-23 DIAGNOSIS — Z8616 Personal history of COVID-19: Secondary | ICD-10-CM

## 2021-05-23 DIAGNOSIS — G894 Chronic pain syndrome: Secondary | ICD-10-CM | POA: Diagnosis present

## 2021-05-23 DIAGNOSIS — D539 Nutritional anemia, unspecified: Secondary | ICD-10-CM | POA: Diagnosis present

## 2021-05-23 DIAGNOSIS — Z960 Presence of urogenital implants: Secondary | ICD-10-CM | POA: Diagnosis present

## 2021-05-23 DIAGNOSIS — Z716 Tobacco abuse counseling: Secondary | ICD-10-CM

## 2021-05-23 DIAGNOSIS — E559 Vitamin D deficiency, unspecified: Secondary | ICD-10-CM | POA: Diagnosis present

## 2021-05-23 LAB — URINALYSIS, ROUTINE W REFLEX MICROSCOPIC
Bilirubin Urine: NEGATIVE
Glucose, UA: NEGATIVE mg/dL
Ketones, ur: NEGATIVE mg/dL
Nitrite: POSITIVE — AB
Protein, ur: 100 mg/dL — AB
Specific Gravity, Urine: 1.009 (ref 1.005–1.030)
WBC, UA: >50 WBC/hpf — ABNORMAL HIGH (ref 0–5)
pH: 6 (ref 5.0–8.0)

## 2021-05-23 LAB — CBC WITH DIFFERENTIAL/PLATELET
Abs Immature Granulocytes: 0.09 10*3/uL — ABNORMAL HIGH (ref 0.00–0.07)
Basophils Absolute: 0 10*3/uL (ref 0.0–0.1)
Basophils Relative: 0 %
Eosinophils Absolute: 0 10*3/uL (ref 0.0–0.5)
Eosinophils Relative: 0 %
HCT: 29.1 % — ABNORMAL LOW (ref 36.0–46.0)
Hemoglobin: 9.4 g/dL — ABNORMAL LOW (ref 12.0–15.0)
Immature Granulocytes: 2 %
Lymphocytes Relative: 19 %
Lymphs Abs: 1.1 10*3/uL (ref 0.7–4.0)
MCH: 33 pg (ref 26.0–34.0)
MCHC: 32.3 g/dL (ref 30.0–36.0)
MCV: 102.1 fL — ABNORMAL HIGH (ref 80.0–100.0)
Monocytes Absolute: 1.2 10*3/uL — ABNORMAL HIGH (ref 0.1–1.0)
Monocytes Relative: 21 %
Neutro Abs: 3.3 10*3/uL (ref 1.7–7.7)
Neutrophils Relative %: 58 %
Platelets: 125 10*3/uL — ABNORMAL LOW (ref 150–400)
RBC: 2.85 MIL/uL — ABNORMAL LOW (ref 3.87–5.11)
RDW: 17.2 % — ABNORMAL HIGH (ref 11.5–15.5)
WBC: 5.8 10*3/uL (ref 4.0–10.5)
nRBC: 1 % — ABNORMAL HIGH (ref 0.0–0.2)

## 2021-05-23 LAB — BLOOD CULTURE ID PANEL (REFLEXED) - BCID2

## 2021-05-23 LAB — RENAL FUNCTION PANEL
Albumin: 3.2 g/dL — ABNORMAL LOW (ref 3.5–5.0)
Anion gap: 12 (ref 5–15)
BUN: 22 mg/dL (ref 8–23)
CO2: 15 mmol/L — ABNORMAL LOW (ref 22–32)
Calcium: 7.8 mg/dL — ABNORMAL LOW (ref 8.9–10.3)
Chloride: 106 mmol/L (ref 98–111)
Creatinine, Ser: 1.74 mg/dL — ABNORMAL HIGH (ref 0.44–1.00)
GFR, Estimated: 33 mL/min — ABNORMAL LOW (ref >60)
Glucose, Bld: 112 mg/dL — ABNORMAL HIGH (ref 70–99)
Phosphorus: 2.7 mg/dL (ref 2.5–4.6)
Potassium: 3.4 mmol/L — ABNORMAL LOW (ref 3.5–5.1)
Sodium: 133 mmol/L — ABNORMAL LOW (ref 135–145)

## 2021-05-23 LAB — COMPREHENSIVE METABOLIC PANEL
ALT: 17 U/L (ref 0–44)
AST: 24 U/L (ref 15–41)
Albumin: 3.4 g/dL — ABNORMAL LOW (ref 3.5–5.0)
Alkaline Phosphatase: 74 U/L (ref 38–126)
Anion gap: 11 (ref 5–15)
BUN: 27 mg/dL — ABNORMAL HIGH (ref 8–23)
CO2: 19 mmol/L — ABNORMAL LOW (ref 22–32)
Calcium: 7.9 mg/dL — ABNORMAL LOW (ref 8.9–10.3)
Chloride: 100 mmol/L (ref 98–111)
Creatinine, Ser: 2.07 mg/dL — ABNORMAL HIGH (ref 0.44–1.00)
GFR, Estimated: 26 mL/min — ABNORMAL LOW (ref >60)
Glucose, Bld: 103 mg/dL — ABNORMAL HIGH (ref 70–99)
Potassium: 3.3 mmol/L — ABNORMAL LOW (ref 3.5–5.1)
Sodium: 130 mmol/L — ABNORMAL LOW (ref 135–145)
Total Bilirubin: 0.7 mg/dL (ref 0.3–1.2)
Total Protein: 6.9 g/dL (ref 6.5–8.1)

## 2021-05-23 LAB — MAGNESIUM
Magnesium: 0.5 mg/dL — CL (ref 1.7–2.4)
Magnesium: 1.5 mg/dL — ABNORMAL LOW (ref 1.7–2.4)

## 2021-05-23 LAB — RESP PANEL BY RT-PCR (FLU A&B, COVID) ARPGX2
Influenza A by PCR: NEGATIVE
Influenza B by PCR: NEGATIVE
SARS Coronavirus 2 by RT PCR: NEGATIVE

## 2021-05-23 LAB — PROTIME-INR
INR: 1.1 (ref 0.8–1.2)
Prothrombin Time: 14.5 seconds (ref 11.4–15.2)

## 2021-05-23 LAB — APTT: aPTT: 34 seconds (ref 24–36)

## 2021-05-23 LAB — LIPASE, BLOOD: Lipase: 19 U/L (ref 11–51)

## 2021-05-23 LAB — LACTIC ACID, PLASMA: Lactic Acid, Venous: 1.9 mmol/L (ref 0.5–1.9)

## 2021-05-23 MED ORDER — ACETAMINOPHEN 650 MG RE SUPP: 650.0000 mg | Freq: Four times a day (QID) | RECTAL | Status: DC | PRN

## 2021-05-23 MED ORDER — CALCIUM GLUCONATE-NACL 1-0.675 GM/50ML-% IV SOLN
1.0000 g | Freq: Once | INTRAVENOUS | Status: AC
  Administered 2021-05-23: 1000 mg via INTRAVENOUS
  Filled 2021-05-23: qty 50

## 2021-05-23 MED ORDER — ACETAMINOPHEN 500 MG PO TABS
1000.0000 mg | ORAL_TABLET | Freq: Once | ORAL | Status: AC
  Administered 2021-05-23: 1000 mg via ORAL

## 2021-05-23 MED ORDER — VANCOMYCIN HCL 1750 MG/350ML IV SOLN
1750.0000 mg | INTRAVENOUS | Status: AC
  Administered 2021-05-23: 1750 mg via INTRAVENOUS
  Filled 2021-05-23: qty 350

## 2021-05-23 MED ORDER — OXYCODONE HCL 5 MG PO TABS
5.0000 mg | ORAL_TABLET | ORAL | Status: DC | PRN
  Administered 2021-05-23 – 2021-05-27 (×14): 5 mg via ORAL
  Filled 2021-05-23 (×15): qty 1

## 2021-05-23 MED ORDER — VANCOMYCIN HCL IN DEXTROSE 1-5 GM/200ML-% IV SOLN: 1000.0000 mg | Freq: Once | INTRAVENOUS | Status: DC

## 2021-05-23 MED ORDER — ONDANSETRON HCL 4 MG/2ML IJ SOLN
4.0000 mg | Freq: Four times a day (QID) | INTRAMUSCULAR | Status: DC | PRN
  Administered 2021-05-23: 4 mg via INTRAVENOUS
  Filled 2021-05-23: qty 2

## 2021-05-23 MED ORDER — SODIUM CHLORIDE 0.9 % IV SOLN: 2.0000 g | Freq: Once | INTRAVENOUS | Status: DC

## 2021-05-23 MED ORDER — SODIUM CHLORIDE 0.9 % IV SOLN: 2.0000 g | Freq: Two times a day (BID) | INTRAVENOUS | Status: DC

## 2021-05-23 MED ORDER — SODIUM CHLORIDE 0.9 % IV SOLN
2.0000 g | INTRAVENOUS | Status: DC
  Administered 2021-05-23 – 2021-05-25 (×3): 2 g via INTRAVENOUS
  Filled 2021-05-23: qty 20
  Filled 2021-05-23: qty 2
  Filled 2021-05-23: qty 20

## 2021-05-23 MED ORDER — SODIUM CHLORIDE 0.9 % IV SOLN
2.0000 g | INTRAVENOUS | Status: AC
  Administered 2021-05-23: 2 g via INTRAVENOUS
  Filled 2021-05-23: qty 2

## 2021-05-23 MED ORDER — SODIUM CHLORIDE 0.9 % IV BOLUS (SEPSIS)
1000.0000 mL | Freq: Once | INTRAVENOUS | Status: AC
  Administered 2021-05-23: 1000 mL via INTRAVENOUS

## 2021-05-23 MED ORDER — ACETAMINOPHEN 325 MG PO TABS
650.0000 mg | ORAL_TABLET | Freq: Four times a day (QID) | ORAL | Status: DC | PRN
  Administered 2021-05-24 (×2): 650 mg via ORAL
  Filled 2021-05-23 (×2): qty 2

## 2021-05-23 MED ORDER — ONDANSETRON HCL 4 MG PO TABS: 4.0000 mg | ORAL_TABLET | Freq: Four times a day (QID) | ORAL | Status: DC | PRN

## 2021-05-23 MED ORDER — SODIUM CHLORIDE 0.9 % IV SOLN: 1.0000 g | INTRAVENOUS | Status: DC

## 2021-05-23 MED ORDER — POTASSIUM CHLORIDE CRYS ER 10 MEQ PO TBCR
40.0000 meq | EXTENDED_RELEASE_TABLET | Freq: Every day | ORAL | Status: DC
  Administered 2021-05-23 – 2021-05-25 (×3): 40 meq via ORAL
  Filled 2021-05-23 (×2): qty 4
  Filled 2021-05-23: qty 2

## 2021-05-23 MED ORDER — SODIUM CHLORIDE 0.9 % IV SOLN
2.0000 g | Freq: Every day | INTRAVENOUS | Status: DC
  Administered 2021-05-23: 2 g via INTRAVENOUS
  Filled 2021-05-23: qty 2

## 2021-05-23 MED ORDER — LACTATED RINGERS IV SOLN: INTRAVENOUS | Status: DC

## 2021-05-23 MED ORDER — SODIUM CHLORIDE 0.9 % IV SOLN: INTRAVENOUS | Status: DC

## 2021-05-23 MED ORDER — METRONIDAZOLE 500 MG/100ML IV SOLN
500.0000 mg | Freq: Once | INTRAVENOUS | Status: AC
  Administered 2021-05-23: 500 mg via INTRAVENOUS
  Filled 2021-05-23: qty 100

## 2021-05-23 MED ORDER — MAGNESIUM SULFATE 2 GM/50ML IV SOLN
2.0000 g | Freq: Once | INTRAVENOUS | Status: AC
  Administered 2021-05-23: 2 g via INTRAVENOUS
  Filled 2021-05-23: qty 50

## 2021-05-23 NOTE — ED Notes (Signed)
Elisabeth Pigeon- [place], RN was sent a secure message in regard to admission

## 2021-05-23 NOTE — Progress Notes (Signed)
PHARMACY NOTE -  Cefepime  Pharmacy has been assisting with dosing of cefepime for UTI associated with recent ureteral stent exchange. Dosage remains stable at 2g IV q24 hr and further renal adjustments per institutional Pharmacy antibiotic protocol - borderline CrCl but SCr increasing and will receive 2 doses today  Pharmacy will sign off, following peripherally for culture results or dose adjustments. Please reconsult if a change in clinical status warrants re-evaluation of dosage.  Reuel Boom, PharmD, BCPS [phone] 05/23/2021, 10:35 AM

## 2021-05-23 NOTE — H&P (Signed)
History and Physical    Mary Stark NWG:[id number] DOB: 03-Sep-1956 DOA: 05/23/2021  PCP: [name], DO  Patient coming from: Home  Chief Complaint: fever and weakness  HPI: Mary Stark is a 64 y.o. female with medical history significant of bladder cancer on chemo, arthritis, CKD3a, HTN. Presenting with weakness and fevers. She reports that her symptoms started 2 nights ago. She initially felt weak and just generally "off". Her husband then noted that she was having fevers of 102-103 that were responsive to APAP. She had some increased frequency and shortness of breath as well. This continued through last night and she decided to come to the ED. She noted that she just had a ureter stent exchange 3 days ago. She otherwise denies any other aggravating or alleviating factors.   ED Course: CT was concerning for pyelonephritis. He was hypotensive and thus given fluid boluses. She was started on broad spec abx. [facility] was called for admission.   Review of Systems:  Denies CP, palpitations, syncopal episodes, N/V/D. Review of systems is otherwise negative for all not mentioned in HPI.   PMHx Past Medical History:  Diagnosis Date   Adenocarcinoma of bladder, stage 4 ([place]) 10/2020   oncologist--- dr Alen Blew and urologist-- dr pace;  w/ mets , started chemo 12/ 2021 and bilateral ureteral stents to treat hydronephrosis   Alcohol dependence ([place])    Anemia due to chemotherapy    Anxiety    Arthritis    Back and lt knee   CKD (chronic kidney disease), stage III ([place])    Family history of adverse reaction to anesthesia    father had a "tight" airway   GERD (gastroesophageal reflux disease)    History of 2019 novel coronavirus disease (COVID-19) 08/13/2020   positive result in epic 08-19-2020,  hospital admission in epic,  covid pneumonia with hypoxia respiratory failure, discharged with oxygen for 6 weeks   History of chemotherapy last done 05-13-2021   History of colitis 03/2018    acute colitis with sepsis   History of hepatitis C    TX FOR 2012   History of kidney stones    History of rheumatic fever as a child    per pt no valvular issues   History of sepsis 10/2020   hospital admission in epic due to pyelonephritis due to hydronephrosis, resolved   History of skin cancer    excision from nose   Hyperlipidemia    Hypertension    MDD (major depressive disorder)    Seasonal allergies    Vitamin D deficiency     PSHx Past Surgical History:  Procedure Laterality Date   ANAL RECTAL MANOMETRY N/A 01/31/2020   Procedure: ANO RECTAL MANOMETRY;  Surgeon: Arta Silence, MD;  Location: [facility];  Service: Endoscopy;  Laterality: N/A;   CESAREAN SECTION  1984   CYSTOSCOPY W/ URETERAL STENT PLACEMENT Bilateral 01/21/2021   Procedure: CYSTOSCOPY WITH STENT REPLACEMENT;  Surgeon: Robley Fries, MD;  Location: [facility];  Service: Urology;  Laterality: Bilateral;  1 HR   CYSTOSCOPY W/ URETERAL STENT PLACEMENT Bilateral 05/20/2021   Procedure: CYSTOSCOPY WITH RETROGRADE PYELOGRAM/URETERAL STENT EXCHANGE;  Surgeon: Robley Fries, MD;  Location: [facility];  Service: Urology;  Laterality: Bilateral;   CYSTOSCOPY WITH RETROGRADE PYELOGRAM, URETEROSCOPY AND STENT PLACEMENT Bilateral 06/11/2020   Procedure: CYSTOSCOPY WITH RETROGRADE PYELOGRAM, URETEROSCOPY AND STENT PLACEMENT, RIGHT URETERAL BIOPSY;  Surgeon: Robley Fries, MD;  Location: [facility];  Service: Urology;  Laterality:  Bilateral;  32 MINS   CYSTOSCOPY WITH RETROGRADE PYELOGRAM, URETEROSCOPY AND STENT PLACEMENT Bilateral 07/05/2020   Procedure: CYSTOSCOPY WITH RETROGRADE PYELOGRAM, URETEROSCOPY,  BIOPSIES AND STENT EXCHANGES;  Surgeon: Robley Fries, MD;  Location[facility];  Service: Urology;  Laterality: Bilateral;   FINGER SURGERY Right    5th   GYNECOLOGIC CRYOSURGERY  YRS AGO   IR IMAGING GUIDED PORT INSERTION  11/18/2020   TONSILLECTOMY  AS CHILD    TRANSURETHRAL RESECTION OF BLADDER TUMOR  01/21/2021   Procedure: TRANSURETHRAL RESECTION OF BLADDER TUMOR (TURBT);  Surgeon: Robley Fries, MD;  Location: [facility];  Service: Urology;;    SocHx Vapes nictoine Denies EtOH No illicit Rx  Allergies  Allergen Reactions   Metformin And Related Other (See Comments)    Lactic acidosis    Penicillins Hives and Rash    Reports hives to penicillin and amoxicillin as an adult "a long time ago." Has tolerated cefdinir (09/2020) and cefepime (10/2020) before.    FamHx Family History  Problem Relation Age of Onset   Ovarian cancer Mother    Cardiomyopathy Father    Valvular heart disease Father    Liver cancer Brother    Protein C deficiency Brother    Protein C deficiency Brother    Stroke Brother     Prior to Admission medications   Medication Sig Start Date End Date Taking? Authorizing Provider  acetaminophen (TYLENOL) 500 MG tablet Take 1,000 mg by mouth every 8 (eight) hours as needed for moderate pain.     [provider]  albuterol (PROAIR HFA) 108 (90 Base) MCG/ACT inhaler INHALE 1 TO 2 PUFFS BY MOUTH INTO THE LUNGS EVERY 4 HOURS AS NEEDED FOR WHEEZING OR SHORTNESS OF BREATH Patient taking differently: Inhale 1-2 puffs into the lungs every 4 (four) hours as needed for wheezing or shortness of breath. INHALE 1 TO 2 PUFFS BY MOUTH INTO THE LUNGS EVERY 4 HOURS AS NEEDED FOR WHEEZING OR SHORTNESS OF BREATH 08/28/20   [name], DO  amLODipine (NORVASC) 5 MG tablet TAKE 1 TABLET BY MOUTH ONCE A DAY (NEEDS OFFICE VISIT) 03/21/21 03/21/22  [name], DO  ARIPiprazole 10 MG TABS Take 10 mg by mouth daily. Patient taking differently: Take 10 mg by mouth at bedtime. abilify 11/28/20   [name], DO  ascorbic acid (VITAMIN C) 500 MG tablet Take 1,000 mg by mouth every evening.     [provider]  aspirin 81 MG EC tablet TAKE 1 TABLET BY MOUTH DAILY. Patient taking differently: Take  81 mg by mouth daily. Not taking since 05-01-2021 due to nasal irritation 04/09/20 06/22/21  [name], DO  buPROPion (WELLBUTRIN XL) 300 MG 24 hr tablet TAKE 1 TABLET BY MOUTH EVERY DAY Patient taking differently: Take 300 mg by mouth daily. 04/21/21 04/21/22  [name], DO  Cholecalciferol (VITAMIN D) 125 MCG (5000 UT) CAPS Take 5,000 Units by mouth every evening.     [provider]  ciprofloxacin (CIPRO) 500 MG tablet Take 1 tablet (500 mg total) by mouth 2 (two) times daily for 5 days. 05/22/21 05/27/21  [name], DO  clonazePAM (KLONOPIN) 0.5 MG tablet TAKE 1 TABLET BY MOUTH AT BEDTIME 04/21/21 10/18/21  Hali Marry, MD  Coenzyme Q10 (CO Q-10) 200 MG CAPS Take 200 mg by mouth every evening.     [provider]  fexofenadine (ALLEGRA) 180 MG tablet Take 180 mg by mouth every evening.     [provider]  furosemide (LASIX) 20 MG tablet TAKE 1 TABLET BY MOUTH ONCE A DAY 01/10/21 01/10/22  [name], DO  gabapentin (NEURONTIN) 300 MG capsule TAKE 1 CAPSULE BY MOUTH IN THE MORNING, 1 CAP IN THE AFTERNOON & 4 CAPS AT NIGHT 05/07/21 05/07/22  [name], DO  GLUCOSAMINE-CHONDROITIN PO Take 2 tablets by mouth every evening.     [provider]  guaiFENesin-dextromethorphan (ROBITUSSIN DM) 100-10 MG/5ML syrup Take 10 mLs by mouth every 4 (four) hours as needed for cough. 08/28/20   [name], DO  lidocaine-prilocaine (EMLA) cream Apply 1 application topically as needed. 11/06/20   Wyatt Portela, MD  Meth-Hyo-M Bl-Na Phos-Ph Sal (URO-MP) 118 MG CAPS TAKE 1 CAPSULE BY MOUTH 3 TIMES DAILY AS NEEDED WITH A FULL GLASS OF WATER 02/18/21 02/18/22  Hollace Hayward, NP  mirabegron ER (MYRBETRIQ) 25 MG TB24 tablet TAKE 1 TABLET BY MOUTH ONCE A DAY 04/21/21 04/21/22    Nebivolol HCl 20 MG TABS TAKE 1 TABLET BY MOUTH DAILY. Patient taking differently: Take 1 tablet by mouth daily. bystoloic 04/16/21 04/16/22  [name], DO   nitrofurantoin (MACRODANTIN) 100 MG capsule Take 1 capsule (100 mg total) by mouth at bedtime. 10/30/20   [name], DO  Omega-3 Fatty Acids (FISH OIL) 1200 MG CAPS Take 1,200 mg by mouth every evening.     [provider]  omeprazole (PRILOSEC) 20 MG capsule TAKE 1 CAPSULE BY MOUTH ONCE A DAY * NEEDS APPT FOR REFILLS 03/12/21 03/12/22  [name], DO  ondansetron (ZOFRAN) 4 MG tablet Take 1 tablet (4 mg total) by mouth every 8 (eight) hours as needed for nausea or vomiting. 04/30/21   Wyatt Portela, MD  oxybutynin (DITROPAN) 5 MG tablet TAKE 1 TABLET BY MOUTH EVERY 8 HOURS AS NEEDED 05/20/21 05/20/22    oxymetazoline (AFRIN) 0.05 % nasal spray Place 1 spray into both nostrils 2 (two) times daily.    [provider]  Phenazopyridine HCl (AZO URINARY PAIN PO) Take by mouth as needed.    [provider]  simvastatin (ZOCOR) 20 MG tablet TAKE 1 TABLET (20 MG TOTAL) BY MOUTH DAILY. Patient taking differently: Take by mouth at bedtime. 04/02/21 04/02/22  [name], DO  tamsulosin (FLOMAX) 0.4 MG CAPS capsule TAKE 1 CAPSULE BY MOUTH AT BEDTIME 04/21/21 04/21/22    thiamine 100 MG tablet Take 1 tablet (100 mg total) by mouth daily. 10/26/20   Bonnielee Haff, MD  vitamin B-12 (CYANOCOBALAMIN) 1000 MCG tablet Take 2,000 mcg by mouth every evening.     [provider]  Vitamin E 180 MG (400 UNIT) CAPS Take 400 Units by mouth every evening.     [provider]    Physical Exam: Vitals:   05/23/21 0330 05/23/21 0345 05/23/21 0430 05/23/21 0630  BP: 98/70 114/64 (!) 123/58 (!) 160/76  Pulse: 79 83 86 (!) 106  Resp: 16 20 (!) 24 (!) 22  Temp:      TempSrc:      SpO2: 100% 93% 100% 94%  Weight:      Height:        General: 65 y.o. female resting in bed in NAD Eyes: PERRL, normal sclera ENMT: Nares patent w/o discharge, orophaynx clear, dentition normal, ears w/o discharge/lesions/ulcers Neck: Supple, trachea midline Cardiovascular:  tachy, +S1, S2, no m/g/r, equal pulses throughout Respiratory: CTABL, no w/r/r, normal WOB GI: BS+, ND, mild TTP to LLQ, no masses noted, no organomegaly noted, no CVAT MSK: No e/c/c  Skin: No rashes, bruises, ulcerations noted Neuro: A&O x 3, no focal deficits Psyc: Appropriate interaction and affect, calm/cooperative  Labs on Admission: I have personally reviewed following labs and imaging studies  CBC: Recent Labs  Lab 05/20/21 0826 05/22/21 0000 05/23/21 0100  WBC  --  6.3 5.8  NEUTROABS  --  3,994 3.3  HGB 9.2* 9.0* 9.4*  HCT 27.0* 27.2* 29.1*  MCV  --  99.3 102.1*  PLT  --  149 096*   Basic Metabolic Panel: Recent Labs  Lab 05/20/21 0826 05/22/21 0000 05/23/21 0100  NA 141 134* 130*  K 4.1 4.0 3.3*  CL 104 101 100  CO2  --  23 19*  GLUCOSE 98 108* 103*  BUN 22 29* 27*  CREATININE 1.40* 1.84* 2.07*  CALCIUM  --  8.1* 7.9*  MG  --  1.1*  --    GFR: Estimated Creatinine Clearance: 30.3 mL/min (A) (by C-G formula based on SCr of 2.07 mg/dL (H)). Liver Function Tests: Recent Labs  Lab 05/22/21 0000 05/23/21 0100  AST 15 24  ALT 9 17  ALKPHOS  --  74  BILITOT 0.3 0.7  PROT 6.5 6.9  ALBUMIN  --  3.4*   Recent Labs  Lab 05/23/21 0100  LIPASE 19   No results for input(s): AMMONIA in the last 168 hours. Coagulation Profile: Recent Labs  Lab 05/23/21 0100  INR 1.1   Cardiac Enzymes: No results for input(s): CKTOTAL, CKMB, CKMBINDEX, TROPONINI in the last 168 hours. BNP (last 3 results) No results for input(s): PROBNP in the last 8760 hours. HbA1C: No results for input(s): HGBA1C in the last 72 hours. CBG: No results for input(s): GLUCAP in the last 168 hours. Lipid Profile: No results for input(s): CHOL, HDL, LDLCALC, TRIG, CHOLHDL, LDLDIRECT in the last 72 hours. Thyroid Function Tests: No results for input(s): TSH, T4TOTAL, FREET4, T3FREE, THYROIDAB in the last 72 hours. Anemia Panel: No results for input(s): VITAMINB12, FOLATE, FERRITIN,  TIBC, IRON, RETICCTPCT in the last 72 hours. Urine analysis:    Component Value Date/Time   COLORURINE AMBER (A) 05/23/2021 0237   APPEARANCEUR HAZY (A) 05/23/2021 0237   LABSPEC 1.009 05/23/2021 0237   PHURINE 6.0 05/23/2021 0237   GLUCOSEU NEGATIVE 05/23/2021 0237   HGBUR SMALL (A) 05/23/2021 0237   BILIRUBINUR NEGATIVE 05/23/2021 0237   BILIRUBINUR negative 12/21/2020 1230   KETONESUR NEGATIVE 05/23/2021 0237   PROTEINUR 100 (A) 05/23/2021 0237   UROBILINOGEN 0.2 12/21/2020 1230   NITRITE POSITIVE (A) 05/23/2021 0237   LEUKOCYTESUR LARGE (A) 05/23/2021 0237    Radiological Exams on Admission: CT ABDOMEN PELVIS WO CONTRAST  Result Date: 05/23/2021 CLINICAL DATA:  Fever, abdominal pain, metastatic urothelial carcinoma undergoing chemotherapy,. EXAM: CT ABDOMEN AND PELVIS WITHOUT CONTRAST TECHNIQUE: Multidetector CT imaging of the abdomen and pelvis was performed following the standard protocol without IV contrast. COMPARISON:  03/27/2021 FINDINGS: Lower chest: There is subpleural reticulation and mild ground-glass pulmonary infiltrate again noted within the lung bases bilaterally most in keeping with organizing pneumonia and fibrosis related to remote COVID-19 pneumonia. These changes appear stable since prior examination. Stable 6 mm pulmonary nodule within the a subpleural right lower lobe, axial image # 9. No pleural effusion. The visualized heart and pericardium are unremarkable. Small hiatal hernia. Hepatobiliary: No focal liver abnormality is seen. No gallstones, gallbladder wall thickening, or biliary dilatation. Pancreas: Unremarkable Spleen: Unremarkable Adrenals/Urinary Tract: The adrenal glands are unremarkable. The kidneys are normal in size and position. Bilateral double-J ureteral stents  are in expected position, unchanged from prior examination. Left-sided hydronephrosis has resolved. Fullness and soft tissue infiltration of the renal pelves bilaterally appears stable. There is  increasing mild left perinephric stranding, possibly related to superimposed inflammation. The bladder is unremarkable. Stomach/Bowel: Stomach, small bowel, and large bowel are unremarkable. The appendix is normal. No free intraperitoneal gas or fluid. Vascular/Lymphatic: Mild aortoiliac atherosclerotic calcification. No aortic aneurysm. Pathologic adenopathy within the retroperitoneum has improved in the interval in keeping with response to therapy. Index left retroperitoneal lymph node measures 1.8 x 2.2 cm (previously 2.7 x 3.0 cm). No new pathologic adenopathy within the abdomen and pelvis. Reproductive: Uterus and bilateral adnexa are unremarkable. Other: Tiny fat containing umbilical hernia.  Rectum unremarkable. Musculoskeletal: No acute bone abnormality. No lytic or blastic bone lesions. Osseous structures are age-appropriate. IMPRESSION: Bilateral double-J ureteral stents are unchanged in position and left sided hydronephrosis has resolved. Stable fullness and soft tissue infiltration within the renal pelves bilaterally, not optimally assessed on this noncontrast examination but likely representing a combination of malignant disease and inflammatory reaction related to chronic catheterization. However, superimposed perinephric stranding on the left may represent a superimposed inflammatory process such as pyelonephritis and correlation with urinalysis and urine culture may be helpful. Interval response to therapy with decreasing retroperitoneal lymph node. Index lymph node as outlined above. Stable bibasilar pulmonary changes most in keeping with remote COVID-19 pneumonia and residual organizing pneumonia/fibrosis. Stable 6 mm indeterminate right basilar noncalcified pulmonary nodule. Aortic Atherosclerosis (ICD10-I70.0). Electronically Signed   By: Fidela Salisbury MD   On: 05/23/2021 04:01   DG Chest Port 1 View  Result Date: 05/23/2021 CLINICAL DATA:  Questionable sepsis.  Fever. EXAM: PORTABLE CHEST 1  VIEW COMPARISON:  Chest x-ray 04/30/2021, CT chest 03/27/2021 FINDINGS: Right chest wall Port-A-Cath with tip overlying the expected region of the superior cavoatrial junction. The heart size and mediastinal contours are unchanged. Similar bilateral linear atelectasis/scarring. No focal consolidation. No pulmonary edema. No pleural effusion. No pneumothorax. No acute osseous abnormality. IMPRESSION: No active disease. Electronically Signed   By: Iven Finn M.D.   On: 05/23/2021 02:40    EKG: Independently reviewed. Sinus, no st elevations  Assessment/Plan Pyelonephritis SIRS     - admitted to obs, med-surg     - was recently on cipro; with recent urological procedure, will give broad spec abx     - fluids, pain control     - CT shows good placement of stents  Hx of bladder cancer Hx of urinary obstruction s/p stents     - follows w/ Dr Alen Blew; notified of admission  AKI on CKD3a     - fluids, follow     - CT results above  Macrocytic anemia Thrombocytopenia     - baseline Hgb is 9 - 11; she is at baseline w/o evidence of bleed; follow     - trend plts for now  Hx of HTN     - hold anti-hypertensives and she was hypotensive at admission  Hypokalemia Hyponatremia Hypocalcemia Hypomagnesemia     - add 4mEq PO K+, trend     - will continue fluids for Na+; trend     - add 1g Ca2+ IV; trend     - Mg2+ was from 1 day ago and is likely still low, but will repeat; add 2g Mg2+  HLD     - continue home regimen  Anxiety     - continue home regimen  Nicotine abuse     -  counsel against further use  DVT prophylaxis: SCDs  Code Status: DNR, confirmed with patient  Family Communication: w/ husband at bedside  Consults called: Med-onc   Status is: Observation  The patient remains OBS appropriate and will d/c before 2 midnights.  Dispo: The patient is from: Home              Anticipated d/c is to: Home              Patient currently is not medically stable to d/c.    Difficult to place patient No  Time spent coordinating admission: 70 minutes  [facility]  If 7PM-7AM, please contact night-coverage [email or web address]  05/23/2021, 7:09 AM

## 2021-05-23 NOTE — ED Notes (Signed)
Patient was assisted to the restroom. She said she did not want to use the bedside commode.

## 2021-05-23 NOTE — ED Notes (Signed)
Patient is waiting for admission assessment

## 2021-05-23 NOTE — ED Notes (Signed)
Called Oncology@04 :45am.

## 2021-05-23 NOTE — ED Notes (Signed)
Patient continues to wait for admission bed assignment

## 2021-05-23 NOTE — Progress Notes (Signed)
Blood cultures growing E coli without resistance. Pharm rec's changing antibiotic to Rocephin. Change was made over night.

## 2021-05-23 NOTE — Progress Notes (Signed)
A consult was received from an ED physician for Vancomycin and Aztreonam per pharmacy dosing.   Pharmacist asked to investigate beta-lactam allergy. If history of intolerance, mild allergy, or documented history of use of cephalosporins, pharmacy can adjust aztreonam to cefepime.  The patient's profile has been reviewed for ht/wt/allergies/indication/available labs.  Reviewing records in Epic, patient tolerated Cefepime in November 2021.  Aztreonam order d/c'ed and a one time order has been placed for Cefepime 2gm IV and Vancomycin 1750mg  IV.    Further antibiotics/pharmacy consults should be ordered by admitting physician if indicated.                       Thank you, Everette Rank, PharmD 05/23/2021  1:01 AM

## 2021-05-23 NOTE — ED Notes (Signed)
Patient is ready for admission 

## 2021-05-23 NOTE — Sepsis Progress Note (Signed)
Monitoring for code sepsis protocol. 

## 2021-05-23 NOTE — Progress Notes (Signed)
PHARMACY - PHYSICIAN COMMUNICATION CRITICAL VALUE ALERT - BLOOD CULTURE IDENTIFICATION (BCID)  Mary Stark is an 65 y.o. female who presented to [facility] on 05/23/2021 with a chief complaint of fever and weakness. Recent stent exchange 6/7, Urine and Blood Cx ordered  Assessment:  6/10 BCx resulted with BCID 6/10 in 2/4 bottles E coli (aerobic bottle each set), no resistance noted, await sens  Name of physician (or Provider) Contacted: Asia Zierle-Ghosh  Current antibiotics: Cefepime 2gm q24  Changes to prescribed antibiotics recommended: change abx to Ceftriaxone 2gm q24   Results for orders placed or performed during the hospital encounter of 05/23/21  Blood Culture ID Panel (Reflexed) (Collected: 05/23/2021  1:04 AM)  Result Value Ref Range   Enterococcus faecalis NOT DETECTED NOT DETECTED   Enterococcus Faecium NOT DETECTED NOT DETECTED   Listeria monocytogenes NOT DETECTED NOT DETECTED   Staphylococcus species NOT DETECTED NOT DETECTED   Staphylococcus aureus (BCID) NOT DETECTED NOT DETECTED   Staphylococcus epidermidis NOT DETECTED NOT DETECTED   Staphylococcus lugdunensis NOT DETECTED NOT DETECTED   Streptococcus species NOT DETECTED NOT DETECTED   Streptococcus agalactiae NOT DETECTED NOT DETECTED   Streptococcus pneumoniae NOT DETECTED NOT DETECTED   Streptococcus pyogenes NOT DETECTED NOT DETECTED   A.calcoaceticus-baumannii NOT DETECTED NOT DETECTED   Bacteroides fragilis NOT DETECTED NOT DETECTED   Enterobacterales DETECTED (A) NOT DETECTED   Enterobacter cloacae complex NOT DETECTED NOT DETECTED   Escherichia coli DETECTED (A) NOT DETECTED   Klebsiella aerogenes NOT DETECTED NOT DETECTED   Klebsiella oxytoca NOT DETECTED NOT DETECTED   Klebsiella pneumoniae NOT DETECTED NOT DETECTED   Proteus species NOT DETECTED NOT DETECTED   Salmonella species NOT DETECTED NOT DETECTED   Serratia marcescens NOT DETECTED NOT DETECTED   Haemophilus influenzae NOT DETECTED NOT  DETECTED   Neisseria meningitidis NOT DETECTED NOT DETECTED   Pseudomonas aeruginosa NOT DETECTED NOT DETECTED   Stenotrophomonas maltophilia NOT DETECTED NOT DETECTED   Candida albicans NOT DETECTED NOT DETECTED   Candida auris NOT DETECTED NOT DETECTED   Candida glabrata NOT DETECTED NOT DETECTED   Candida krusei NOT DETECTED NOT DETECTED   Candida parapsilosis NOT DETECTED NOT DETECTED   Candida tropicalis NOT DETECTED NOT DETECTED   Cryptococcus neoformans/gattii NOT DETECTED NOT DETECTED   CTX-M ESBL NOT DETECTED NOT DETECTED   Carbapenem resistance IMP NOT DETECTED NOT DETECTED   Carbapenem resistance KPC NOT DETECTED NOT DETECTED   Carbapenem resistance NDM NOT DETECTED NOT DETECTED   Carbapenem resist OXA 48 LIKE NOT DETECTED NOT DETECTED   Carbapenem resistance VIM NOT DETECTED NOT DETECTED    Minda Ditto PharmD 05/23/2021  7:47 PM

## 2021-05-23 NOTE — ED Notes (Signed)
The patient was made aware of the admission room assignment [id number]

## 2021-05-23 NOTE — ED Triage Notes (Signed)
Pt presents from home with husband. C/o fever x 2 days with weakness and fatigue. Last chemo was last Tuesday. Started on abx for UTI yesterday. Pt states max temp at home was 103.1 and last tylenol was around 1130pm.

## 2021-05-23 NOTE — Telephone Encounter (Signed)
Scheduled per los, called patient and left a voicemail regarding upcoming appointments.

## 2021-05-23 NOTE — ED Provider Notes (Signed)
Emergency Department Provider Note   I have reviewed the triage vital signs and the nursing notes.   HISTORY  Chief Complaint Fever   HPI Mary Stark is a 65 y.o. female with past medical history of stage IV adenocarcinoma of the bladder status post recent replacement of bilateral ureteral stents on 6/7 currently on chemotherapy presents to the emergency department with fever, weakness, diarrhea.  Symptoms have progressively worsened over the past 2 days.  Patient presents here with her husband who states that she was tested earlier today for urinary tract infection and started on Cipro.  She has had 2 doses of 500 mg Cipro in the past 24 hours.  The patient's husband states that she seems slightly more "foggy" than normal.  Patient is not having any chest pain, shortness of breath, upper respiratory infection symptoms.  Patient denies any abdominal or flank pain.  She tells me that she is urinating normally without severe discomfort.  No radiation of symptoms or other modifying factors.  Past Medical History:  Diagnosis Date   Adenocarcinoma of bladder, stage 4 ([place]) 10/2020   oncologist--- dr Alen Blew and urologist-- dr pace;  w/ mets , started chemo 12/ 2021 and bilateral ureteral stents to treat hydronephrosis   Alcohol dependence ([place])    Anemia due to chemotherapy    Anxiety    Arthritis    Back and lt knee   CKD (chronic kidney disease), stage III ([place])    Family history of adverse reaction to anesthesia    father had a "tight" airway   GERD (gastroesophageal reflux disease)    History of 2019 novel coronavirus disease (COVID-19) 08/13/2020   positive result in epic 08-19-2020,  hospital admission in epic,  covid pneumonia with hypoxia respiratory failure, discharged with oxygen for 6 weeks   History of chemotherapy last done 05-13-2021   History of colitis 03/2018   acute colitis with sepsis   History of hepatitis C    TX FOR 2012   History of kidney stones    History  of rheumatic fever as a child    per pt no valvular issues   History of sepsis 10/2020   hospital admission in epic due to pyelonephritis due to hydronephrosis, resolved   History of skin cancer    excision from nose   Hyperlipidemia    Hypertension    MDD (major depressive disorder)    Seasonal allergies    Vitamin D deficiency     Patient Active Problem List   Diagnosis Date Noted   UTI (urinary tract infection) 05/23/2021   Right lower quadrant pain 01/01/2021   Incontinence of feces 01/01/2021   Abnormal findings on diagnostic imaging of other abdominal regions, including retroperitoneum 01/01/2021   Change in bowel habit 01/01/2021   Hemorrhoids without complication 15/17/6160   Impingement syndrome, shoulder, left 11/27/2020   Port-A-Cath in place 11/22/2020   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/06/2020   Malignancy ([place]) 10/31/2020   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   Sepsis secondary to UTI ([place]) 10/22/2020   Normocytic anemia 10/22/2020   Hyperproteinemia 10/22/2020   Hydronephrosis 10/01/2020   Proteinuria 10/01/2020   Abnormal CT of the abdomen 10/01/2020   Bilateral lower extremity edema 09/12/2020   Acute hypoxemic respiratory failure due to COVID-19 ([place]) 08/19/2020   Tear of medial meniscus of left knee, current 07/13/2019   Internal derangement of knee involving posterior horn of lateral meniscus, left 07/13/2019   Primary osteoarthritis of both knees 07/13/2019  Renal insufficiency 07/13/2019   Anxiety about health 03/22/2019   Hypokalemia 03/21/2019   Essential hypertension 02/13/2019   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/2018   Anemia due to blood loss, acute 04/14/2018   Hypomagnesemia 04/14/2018   Hypocalcemia 04/14/2018   Tobacco abuse 04/08/2018   Depression 04/07/2018   AKI (acute kidney injury) ([place]) 04/07/2018   Sepsis ([place]) 04/07/2018   Acute colitis 04/07/2018   Avascular necrosis of humeral head, right ([place]) 03/30/2018   NSAID Sangeeta Youse-term use 03/30/2018    Goals of care, counseling/discussion 03/30/2018   Seborrheic keratoses 03/15/2018   History of nonmelanoma skin cancer 03/02/2018   Skin lesion of chest wall 03/02/2018   Alcohol dependence with unspecified alcohol-induced disorder ([place]) 02/11/2018   Severe episode of recurrent major depressive disorder, without psychotic features ([place]) 02/11/2018   Transaminitis 01/06/2018   Nicotine dependence 01/06/2018   Heavy alcohol consumption 01/06/2018   Encounter for monitoring statin therapy 01/06/2018   Class 1 obesity due to excess calories with serious comorbidity in adult 01/06/2018   Chronic pain syndrome 04/21/2017   Chronic right shoulder pain 07/03/2015   Vaginismus 04/12/2014   Menopausal state 11/08/2013   Family history of ovarian cancer 09/27/2013   Postmenopausal atrophic vaginitis 09/12/2013   Personal history of colonic polyps 07/18/2013   Dyslipidemia, goal LDL below 100 07/17/2013   Depression with anxiety 07/17/2013   Hx of hepatitis C 07/17/2013   Vitamin D deficiency 07/17/2013   Right lumbar radiculopathy 07/17/2013   History of alcohol dependence ([place]) 07/17/2013   H/O: rheumatic fever 06/30/2012   Insomnia 06/30/2012   Personal history of other infectious and parasitic diseases 06/30/2012    Past Surgical History:  Procedure Laterality Date   ANAL RECTAL MANOMETRY N/A 01/31/2020   Procedure: ANO RECTAL MANOMETRY;  Surgeon: Arta Silence, MD;  Location: [facility];  Service: Endoscopy;  Laterality: N/A;   CESAREAN SECTION  1984   CYSTOSCOPY W/ URETERAL STENT PLACEMENT Bilateral 01/21/2021   Procedure: CYSTOSCOPY WITH STENT REPLACEMENT;  Surgeon: Robley Fries, MD;  Location[facility];  Service: Urology;  Laterality: Bilateral;  1 HR   CYSTOSCOPY W/ URETERAL STENT PLACEMENT Bilateral 05/20/2021   Procedure: CYSTOSCOPY WITH RETROGRADE PYELOGRAM/URETERAL STENT EXCHANGE;  Surgeon: Robley Fries, MD;  Location: [facility];   Service: Urology;  Laterality: Bilateral;   CYSTOSCOPY WITH RETROGRADE PYELOGRAM, URETEROSCOPY AND STENT PLACEMENT Bilateral 06/11/2020   Procedure: CYSTOSCOPY WITH RETROGRADE PYELOGRAM, URETEROSCOPY AND STENT PLACEMENT, RIGHT URETERAL BIOPSY;  Surgeon: Robley Fries, MD;  Location: [facility];  Service: Urology;  Laterality: Bilateral;  32 MINS   CYSTOSCOPY WITH RETROGRADE PYELOGRAM, URETEROSCOPY AND STENT PLACEMENT Bilateral 07/05/2020   Procedure: CYSTOSCOPY WITH RETROGRADE PYELOGRAM, URETEROSCOPY,  BIOPSIES AND STENT EXCHANGES;  Surgeon: Robley Fries, MD;  Location: [facility];  Service: Urology;  Laterality: Bilateral;   FINGER SURGERY Right    5th   GYNECOLOGIC CRYOSURGERY  YRS AGO   IR IMAGING GUIDED PORT INSERTION  11/18/2020   TONSILLECTOMY  AS CHILD   TRANSURETHRAL RESECTION OF BLADDER TUMOR  01/21/2021   Procedure: TRANSURETHRAL RESECTION OF BLADDER TUMOR (TURBT);  Surgeon: Robley Fries, MD;  Location: [facility];  Service: Urology;;    Allergies Metformin and related and Penicillins  Family History  Problem Relation Age of Onset   Ovarian cancer Mother    Cardiomyopathy Father    Valvular heart disease Father    Liver cancer Brother    Protein C deficiency Brother  Protein C deficiency Brother    Stroke Brother     Social History Social History   Tobacco Use   Smoking status: Former    Packs/day: 1.00    Years: 25.00    Pack years: 25.00    Types: Cigarettes    Quit date: 2012    Years since quitting: 10.4   Smokeless tobacco: Never  Vaping Use   Vaping Use: Every day   Substances: Nicotine  Substance Use Topics   Alcohol use: Yes    Alcohol/week: 24.0 - 36.0 standard drinks    Types: 24 - 36 Cans of beer per week    Comment: occ wine   Drug use: No    Review of Systems  Constitutional: Positive fever/chills Eyes: No visual changes. ENT: No sore throat. Cardiovascular: Denies chest pain. Respiratory: Denies  shortness of breath. Gastrointestinal: No abdominal pain.  No nausea, no vomiting. Positive diarrhea.  No constipation. Genitourinary: Negative for dysuria. Musculoskeletal: Negative for back pain. Skin: Negative for rash. Neurological: Negative for headaches, focal weakness or numbness.  10-point ROS otherwise negative.  ____________________________________________   PHYSICAL EXAM:  VITAL SIGNS: ED Triage Vitals  Enc Vitals Group     BP 05/23/21 0022 (!) 82/63     Pulse Rate 05/23/21 0022 96     Resp 05/23/21 0022 15     Temp 05/23/21 0022 99.3 F (37.4 C)     Temp Source 05/23/21 0022 Oral     SpO2 05/23/21 0022 95 %     Weight 05/23/21 0022 185 lb (83.9 kg)     Height 05/23/21 0022 5\' 6"  (1.676 m)   Constitutional: Alert and oriented. Well appearing and in no acute distress. Eyes: Conjunctivae are normal. Head: Atraumatic. Nose: No congestion/rhinnorhea. Mouth/Throat: Mucous membranes are slightly dry.  Neck: No stridor.  Cardiovascular: Normal rate, regular rhythm. Good peripheral circulation. Grossly normal heart sounds.   Respiratory: Normal respiratory effort.  No retractions. Lungs CTAB. Gastrointestinal: Soft and nontender. No distention.  Musculoskeletal: No lower extremity tenderness nor edema. No gross deformities of extremities. Neurologic:  Normal speech and language. No gross focal neurologic deficits are appreciated.  Skin:  Skin is warm, dry and intact. No rash noted.  ____________________________________________   LABS (all labs ordered are listed, but only abnormal results are displayed)  Labs Reviewed  BLOOD CULTURE ID PANEL (REFLEXED) - BCID2 - Abnormal; Notable for the following components:      Result Value   Enterobacterales DETECTED (*)    Escherichia coli DETECTED (*)    All other components within normal limits  COMPREHENSIVE METABOLIC PANEL - Abnormal; Notable for the following components:   Sodium 130 (*)    Potassium 3.3 (*)    CO2  19 (*)    Glucose, Bld 103 (*)    BUN 27 (*)    Creatinine, Ser 2.07 (*)    Calcium 7.9 (*)    Albumin 3.4 (*)    GFR, Estimated 26 (*)    All other components within normal limits  CBC WITH DIFFERENTIAL/PLATELET - Abnormal; Notable for the following components:   RBC 2.85 (*)    Hemoglobin 9.4 (*)    HCT 29.1 (*)    MCV 102.1 (*)    RDW 17.2 (*)    Platelets 125 (*)    nRBC 1.0 (*)    Monocytes Absolute 1.2 (*)    Abs Immature Granulocytes 0.09 (*)    All other components within normal limits  URINALYSIS, ROUTINE W REFLEX  MICROSCOPIC - Abnormal; Notable for the following components:   Color, Urine AMBER (*)    APPearance HAZY (*)    Hgb urine dipstick SMALL (*)    Protein, ur 100 (*)    Nitrite POSITIVE (*)    Leukocytes,Ua LARGE (*)    WBC, UA >50 (*)    Bacteria, UA FEW (*)    All other components within normal limits  MAGNESIUM - Abnormal; Notable for the following components:   Magnesium 0.5 (*)    All other components within normal limits  RENAL FUNCTION PANEL - Abnormal; Notable for the following components:   Sodium 133 (*)    Potassium 3.4 (*)    CO2 15 (*)    Glucose, Bld 112 (*)    Creatinine, Ser 1.74 (*)    Calcium 7.8 (*)    Albumin 3.2 (*)    GFR, Estimated 33 (*)    All other components within normal limits  MAGNESIUM - Abnormal; Notable for the following components:   Magnesium 1.5 (*)    All other components within normal limits  CULTURE, BLOOD (ROUTINE X 2)  CULTURE, BLOOD (ROUTINE X 2)  RESP PANEL BY RT-PCR (FLU A&B, COVID) ARPGX2  URINE CULTURE  LACTIC ACID, PLASMA  PROTIME-INR  APTT  LIPASE, BLOOD  PROTIME-INR  CORTISOL-AM, BLOOD  PROCALCITONIN  COMPREHENSIVE METABOLIC PANEL  CBC  MAGNESIUM   ____________________________________________  EKG   EKG Interpretation  Date/Time:  Friday May 23 2021 02:35:39 EDT Ventricular Rate:  79 PR Interval:  193 QRS Duration: 92 QT Interval:  391 QTC Calculation: 449 R Axis:   52 Text  Interpretation: Sinus rhythm Abnormal R-wave progression, early transition Borderline T abnormalities, anterior leads Confirmed by Nanda Quinton [phone]) on 05/24/2021 12:26:23 AM          ____________________________________________  RADIOLOGY  CT ABDOMEN PELVIS WO CONTRAST  Result Date: 05/23/2021 CLINICAL DATA:  Fever, abdominal pain, metastatic urothelial carcinoma undergoing chemotherapy,. EXAM: CT ABDOMEN AND PELVIS WITHOUT CONTRAST TECHNIQUE: Multidetector CT imaging of the abdomen and pelvis was performed following the standard protocol without IV contrast. COMPARISON:  03/27/2021 FINDINGS: Lower chest: There is subpleural reticulation and mild ground-glass pulmonary infiltrate again noted within the lung bases bilaterally most in keeping with organizing pneumonia and fibrosis related to remote COVID-19 pneumonia. These changes appear stable since prior examination. Stable 6 mm pulmonary nodule within the a subpleural right lower lobe, axial image # 9. No pleural effusion. The visualized heart and pericardium are unremarkable. Small hiatal hernia. Hepatobiliary: No focal liver abnormality is seen. No gallstones, gallbladder wall thickening, or biliary dilatation. Pancreas: Unremarkable Spleen: Unremarkable Adrenals/Urinary Tract: The adrenal glands are unremarkable. The kidneys are normal in size and position. Bilateral double-J ureteral stents are in expected position, unchanged from prior examination. Left-sided hydronephrosis has resolved. Fullness and soft tissue infiltration of the renal pelves bilaterally appears stable. There is increasing mild left perinephric stranding, possibly related to superimposed inflammation. The bladder is unremarkable. Stomach/Bowel: Stomach, small bowel, and large bowel are unremarkable. The appendix is normal. No free intraperitoneal gas or fluid. Vascular/Lymphatic: Mild aortoiliac atherosclerotic calcification. No aortic aneurysm. Pathologic adenopathy within  the retroperitoneum has improved in the interval in keeping with response to therapy. Index left retroperitoneal lymph node measures 1.8 x 2.2 cm (previously 2.7 x 3.0 cm). No new pathologic adenopathy within the abdomen and pelvis. Reproductive: Uterus and bilateral adnexa are unremarkable. Other: Tiny fat containing umbilical hernia.  Rectum unremarkable. Musculoskeletal: No acute bone abnormality. No lytic or  blastic bone lesions. Osseous structures are age-appropriate. IMPRESSION: Bilateral double-J ureteral stents are unchanged in position and left sided hydronephrosis has resolved. Stable fullness and soft tissue infiltration within the renal pelves bilaterally, not optimally assessed on this noncontrast examination but likely representing a combination of malignant disease and inflammatory reaction related to chronic catheterization. However, superimposed perinephric stranding on the left may represent a superimposed inflammatory process such as pyelonephritis and correlation with urinalysis and urine culture may be helpful. Interval response to therapy with decreasing retroperitoneal lymph node. Index lymph node as outlined above. Stable bibasilar pulmonary changes most in keeping with remote COVID-19 pneumonia and residual organizing pneumonia/fibrosis. Stable 6 mm indeterminate right basilar noncalcified pulmonary nodule. Aortic Atherosclerosis (ICD10-I70.0). Electronically Signed   By: Fidela Salisbury MD   On: 05/23/2021 04:01   DG Chest Port 1 View  Result Date: 05/23/2021 CLINICAL DATA:  Questionable sepsis.  Fever. EXAM: PORTABLE CHEST 1 VIEW COMPARISON:  Chest x-ray 04/30/2021, CT chest 03/27/2021 FINDINGS: Right chest wall Port-A-Cath with tip overlying the expected region of the superior cavoatrial junction. The heart size and mediastinal contours are unchanged. Similar bilateral linear atelectasis/scarring. No focal consolidation. No pulmonary edema. No pleural effusion. No pneumothorax. No  acute osseous abnormality. IMPRESSION: No active disease. Electronically Signed   By: Iven Finn M.D.   On: 05/23/2021 02:40    ____________________________________________   PROCEDURES  Procedure(s) performed:   Procedures  CRITICAL CARE Performed by: Margette Fast Total critical care time: 35 minutes Critical care time was exclusive of separately billable procedures and treating other patients. Critical care was necessary to treat or prevent imminent or life-threatening deterioration. Critical care was time spent personally by me on the following activities: development of treatment plan with patient and/or surrogate as well as nursing, discussions with consultants, evaluation of patient's response to treatment, examination of patient, obtaining history from patient or surrogate, ordering and performing treatments and interventions, ordering and review of laboratory studies, ordering and review of radiographic studies, pulse oximetry and re-evaluation of patient's condition.  Nanda Quinton, MD Emergency Medicine  ____________________________________________   INITIAL IMPRESSION / ASSESSMENT AND PLAN / ED COURSE  Pertinent labs & imaging results that were available during my care of the patient were reviewed by me and considered in my medical decision making (see chart for details).   Patient with history of stage IV bladder cancer, status post recent stent exchange, on chemotherapy presents with fever, fatigue.  Borderline temp here but reports fevers at home.  There is some hypotension on arrival the patient is awake and alert.  Despite her vital signs she is overall well-appearing.  Have initiated sepsis labs, antibiotics, IV fluids with hypotension.  She is not having abdominal or flank discomfort to suspect specific stent failure.  She has been on 2 doses of Cipro in the past 24 hours.  We will follow cultures, chest x-ray, CT abdomen pelvis, and UA w/ culture.   Lab work  consistent with urinary tract infection.  CT reviewed.  Chest x-ray reviewed.  Plan for antibiotics and admission.  No new hydronephrosis.   Discussed patient's case with [facility] to request admission. Patient and family (if present) updated with plan. Care transferred to [facility] service.  I reviewed all nursing notes, vitals, pertinent old records, EKGs, labs, imaging (as available).  ____________________________________________  FINAL CLINICAL IMPRESSION(S) / ED DIAGNOSES  Final diagnoses:  Acute cystitis with hematuria     MEDICATIONS GIVEN DURING THIS VISIT:  Medications  0.9 %  sodium  chloride infusion ( Intravenous New Bag/Given 05/24/21 0011)  acetaminophen (TYLENOL) tablet 650 mg (650 mg Oral Given 05/24/21 0010)    Or  acetaminophen (TYLENOL) suppository 650 mg ( Rectal See Alternative 05/24/21 0010)  oxyCODONE (Oxy IR/ROXICODONE) immediate release tablet 5 mg (5 mg Oral Given 05/23/21 2134)  ondansetron (ZOFRAN) tablet 4 mg ( Oral See Alternative 05/23/21 1527)    Or  ondansetron (ZOFRAN) injection 4 mg (4 mg Intravenous Given 05/23/21 1527)  potassium chloride (KLOR-CON) CR tablet 40 mEq (40 mEq Oral Given 05/23/21 1523)  cefTRIAXone (ROCEPHIN) 2 g in sodium chloride 0.9 % 100 mL IVPB (2 g Intravenous New Bag/Given 05/23/21 2140)  sodium chloride 0.9 % bolus 1,000 mL (0 mLs Intravenous Stopped 05/23/21 0309)    And  sodium chloride 0.9 % bolus 1,000 mL (0 mLs Intravenous Stopped 05/23/21 0309)    And  sodium chloride 0.9 % bolus 1,000 mL (0 mLs Intravenous Stopped 05/23/21 0434)  metroNIDAZOLE (FLAGYL) IVPB 500 mg (0 mg Intravenous Stopped 05/23/21 0309)  vancomycin (VANCOREADY) IVPB 1750 mg/350 mL (0 mg Intravenous Stopped 05/23/21 0627)  ceFEPIme (MAXIPIME) 2 g in sodium chloride 0.9 % 100 mL IVPB (0 g Intravenous Stopped 05/23/21 0310)  acetaminophen (TYLENOL) tablet 1,000 mg (1,000 mg Oral Given 05/23/21 0636)  calcium gluconate 1 g/ 50 mL sodium chloride IVPB (1,000 mg Intravenous New  Bag/Given 05/23/21 1616)  magnesium sulfate IVPB 2 g 50 mL (0 g Intravenous Stopped 05/23/21 1616)     Note:  This document was prepared using Dragon voice recognition software and may include unintentional dictation errors.  Nanda Quinton, MD, [facility] Emergency Medicine    Naja Apperson, [facility], MD 05/24/21 Shelah Lewandowsky

## 2021-05-24 ENCOUNTER — Encounter: Payer: Self-pay | Admitting: Oncology

## 2021-05-24 DIAGNOSIS — N390 Urinary tract infection, site not specified: Secondary | ICD-10-CM | POA: Diagnosis present

## 2021-05-24 DIAGNOSIS — G894 Chronic pain syndrome: Secondary | ICD-10-CM | POA: Diagnosis present

## 2021-05-24 DIAGNOSIS — A4151 Sepsis due to Escherichia coli [E. coli]: Secondary | ICD-10-CM | POA: Diagnosis present

## 2021-05-24 DIAGNOSIS — E876 Hypokalemia: Secondary | ICD-10-CM | POA: Diagnosis present

## 2021-05-24 DIAGNOSIS — Z66 Do not resuscitate: Secondary | ICD-10-CM | POA: Diagnosis present

## 2021-05-24 DIAGNOSIS — E871 Hypo-osmolality and hyponatremia: Secondary | ICD-10-CM

## 2021-05-24 DIAGNOSIS — Z8616 Personal history of COVID-19: Secondary | ICD-10-CM | POA: Diagnosis not present

## 2021-05-24 DIAGNOSIS — D539 Nutritional anemia, unspecified: Secondary | ICD-10-CM | POA: Diagnosis present

## 2021-05-24 DIAGNOSIS — C679 Malignant neoplasm of bladder, unspecified: Secondary | ICD-10-CM

## 2021-05-24 DIAGNOSIS — F17290 Nicotine dependence, other tobacco product, uncomplicated: Secondary | ICD-10-CM | POA: Diagnosis present

## 2021-05-24 DIAGNOSIS — N179 Acute kidney failure, unspecified: Secondary | ICD-10-CM | POA: Diagnosis present

## 2021-05-24 DIAGNOSIS — D6959 Other secondary thrombocytopenia: Secondary | ICD-10-CM | POA: Diagnosis present

## 2021-05-24 DIAGNOSIS — R6520 Severe sepsis without septic shock: Secondary | ICD-10-CM | POA: Diagnosis present

## 2021-05-24 DIAGNOSIS — I129 Hypertensive chronic kidney disease with stage 1 through stage 4 chronic kidney disease, or unspecified chronic kidney disease: Secondary | ICD-10-CM | POA: Diagnosis present

## 2021-05-24 DIAGNOSIS — K219 Gastro-esophageal reflux disease without esophagitis: Secondary | ICD-10-CM | POA: Diagnosis present

## 2021-05-24 DIAGNOSIS — N3001 Acute cystitis with hematuria: Secondary | ICD-10-CM | POA: Diagnosis present

## 2021-05-24 DIAGNOSIS — N1832 Chronic kidney disease, stage 3b: Secondary | ICD-10-CM | POA: Diagnosis present

## 2021-05-24 DIAGNOSIS — M1990 Unspecified osteoarthritis, unspecified site: Secondary | ICD-10-CM | POA: Diagnosis present

## 2021-05-24 DIAGNOSIS — E785 Hyperlipidemia, unspecified: Secondary | ICD-10-CM | POA: Diagnosis present

## 2021-05-24 DIAGNOSIS — Z20822 Contact with and (suspected) exposure to covid-19: Secondary | ICD-10-CM | POA: Diagnosis present

## 2021-05-24 DIAGNOSIS — A419 Sepsis, unspecified organism: Secondary | ICD-10-CM

## 2021-05-24 DIAGNOSIS — D630 Anemia in neoplastic disease: Secondary | ICD-10-CM | POA: Diagnosis present

## 2021-05-24 DIAGNOSIS — F32A Depression, unspecified: Secondary | ICD-10-CM | POA: Diagnosis present

## 2021-05-24 DIAGNOSIS — E559 Vitamin D deficiency, unspecified: Secondary | ICD-10-CM | POA: Diagnosis present

## 2021-05-24 DIAGNOSIS — F419 Anxiety disorder, unspecified: Secondary | ICD-10-CM | POA: Diagnosis present

## 2021-05-24 LAB — URINALYSIS
Bilirubin Urine: NEGATIVE
Glucose, UA: NEGATIVE
Ketones, ur: NEGATIVE
Nitrite: POSITIVE — AB
Specific Gravity, Urine: 1.018 (ref 1.001–1.035)
pH: 6 (ref 5.0–8.0)

## 2021-05-24 LAB — COMPREHENSIVE METABOLIC PANEL
ALT: 22 U/L (ref 0–44)
AST: 44 U/L — ABNORMAL HIGH (ref 15–41)
Albumin: 3.1 g/dL — ABNORMAL LOW (ref 3.5–5.0)
Alkaline Phosphatase: 72 U/L (ref 38–126)
Anion gap: 8 (ref 5–15)
BUN: 15 mg/dL (ref 8–23)
CO2: 20 mmol/L — ABNORMAL LOW (ref 22–32)
Calcium: 8.1 mg/dL — ABNORMAL LOW (ref 8.9–10.3)
Chloride: 107 mmol/L (ref 98–111)
Creatinine, Ser: 1.61 mg/dL — ABNORMAL HIGH (ref 0.44–1.00)
GFR, Estimated: 36 mL/min — ABNORMAL LOW (ref >60)
Glucose, Bld: 108 mg/dL — ABNORMAL HIGH (ref 70–99)
Potassium: 3.4 mmol/L — ABNORMAL LOW (ref 3.5–5.1)
Sodium: 135 mmol/L (ref 135–145)
Total Bilirubin: 0.4 mg/dL (ref 0.3–1.2)
Total Protein: 6.8 g/dL (ref 6.5–8.1)

## 2021-05-24 LAB — CBC WITH DIFFERENTIAL/PLATELET
Absolute Monocytes: 907 cells/uL (ref 200–950)
Basophils Absolute: 19 cells/uL (ref 0–200)
Basophils Relative: 0.3 %
Eosinophils Absolute: 0 cells/uL — ABNORMAL LOW (ref 15–500)
Eosinophils Relative: 0 %
HCT: 27.2 % — ABNORMAL LOW (ref 35.0–45.0)
Hemoglobin: 9 g/dL — ABNORMAL LOW (ref 11.7–15.5)
Lymphs Abs: 1380 cells/uL (ref 850–3900)
MCH: 32.8 pg (ref 27.0–33.0)
MCHC: 33.1 g/dL (ref 32.0–36.0)
MCV: 99.3 fL (ref 80.0–100.0)
MPV: 10.9 fL (ref 7.5–12.5)
Monocytes Relative: 14.4 %
Neutro Abs: 3994 cells/uL (ref 1500–7800)
Neutrophils Relative %: 63.4 %
Platelets: 149 10*3/uL (ref 140–400)
RBC: 2.74 10*6/uL — ABNORMAL LOW (ref 3.80–5.10)
RDW: 16.7 % — ABNORMAL HIGH (ref 11.0–15.0)
Total Lymphocyte: 21.9 %
WBC: 6.3 10*3/uL (ref 3.8–10.8)

## 2021-05-24 LAB — COMPLETE METABOLIC PANEL WITH GFR
AG Ratio: 1.3 (calc) (ref 1.0–2.5)
ALT: 9 U/L (ref 6–29)
AST: 15 U/L (ref 10–35)
Albumin: 3.7 g/dL (ref 3.6–5.1)
Alkaline phosphatase (APISO): 89 U/L (ref 37–153)
BUN/Creatinine Ratio: 16 (calc) (ref 6–22)
BUN: 29 mg/dL — ABNORMAL HIGH (ref 7–25)
CO2: 23 mmol/L (ref 20–32)
Calcium: 8.1 mg/dL — ABNORMAL LOW (ref 8.6–10.4)
Chloride: 101 mmol/L (ref 98–110)
Creat: 1.84 mg/dL — ABNORMAL HIGH (ref 0.50–0.99)
GFR, Est African American: 33 mL/min/{1.73_m2} — ABNORMAL LOW (ref >=60)
GFR, Est Non African American: 29 mL/min/{1.73_m2} — ABNORMAL LOW (ref >=60)
Globulin: 2.8 g/dL (calc) (ref 1.9–3.7)
Glucose, Bld: 108 mg/dL — ABNORMAL HIGH (ref 65–99)
Potassium: 4 mmol/L (ref 3.5–5.3)
Sodium: 134 mmol/L — ABNORMAL LOW (ref 135–146)
Total Bilirubin: 0.3 mg/dL (ref 0.2–1.2)
Total Protein: 6.5 g/dL (ref 6.1–8.1)

## 2021-05-24 LAB — MICROALBUMIN / CREATININE URINE RATIO
Creatinine, Urine: 127 mg/dL (ref 20–275)
Microalb Creat Ratio: 960 mcg/mg creat — ABNORMAL HIGH (ref <30)
Microalb, Ur: 121.9 mg/dL

## 2021-05-24 LAB — C DIFFICILE QUICK SCREEN W PCR REFLEX
C Diff antigen: NEGATIVE
C Diff interpretation: NOT DETECTED
C Diff toxin: NEGATIVE

## 2021-05-24 LAB — URINE CULTURE
MICRO NUMBER:: 11988475
SPECIMEN QUALITY:: ADEQUATE

## 2021-05-24 LAB — CBC
HCT: 26.9 % — ABNORMAL LOW (ref 36.0–46.0)
Hemoglobin: 8.7 g/dL — ABNORMAL LOW (ref 12.0–15.0)
MCH: 33.3 pg (ref 26.0–34.0)
MCHC: 32.3 g/dL (ref 30.0–36.0)
MCV: 103.1 fL — ABNORMAL HIGH (ref 80.0–100.0)
Platelets: 102 10*3/uL — ABNORMAL LOW (ref 150–400)
RBC: 2.61 MIL/uL — ABNORMAL LOW (ref 3.87–5.11)
RDW: 17.2 % — ABNORMAL HIGH (ref 11.5–15.5)
WBC: 2.9 10*3/uL — ABNORMAL LOW (ref 4.0–10.5)
nRBC: 1.7 % — ABNORMAL HIGH (ref 0.0–0.2)

## 2021-05-24 LAB — CORTISOL-AM, BLOOD: Cortisol - AM: 21.2 ug/dL (ref 6.7–22.6)

## 2021-05-24 LAB — PROCALCITONIN: Procalcitonin: 12.12 ng/mL

## 2021-05-24 LAB — MAGNESIUM
Magnesium: 1.3 mg/dL — ABNORMAL LOW (ref 1.7–2.4)
Magnesium: 1.1 mg/dL — ABNORMAL LOW (ref 1.5–2.5)

## 2021-05-24 LAB — PROTIME-INR
INR: 1.3 — ABNORMAL HIGH (ref 0.8–1.2)
Prothrombin Time: 16.6 seconds — ABNORMAL HIGH (ref 11.4–15.2)

## 2021-05-24 MED ORDER — SODIUM CHLORIDE 0.9 % IV SOLN: INTRAVENOUS | Status: DC

## 2021-05-24 MED ORDER — MAGNESIUM SULFATE 4 GM/100ML IV SOLN
4.0000 g | Freq: Once | INTRAVENOUS | Status: AC
  Administered 2021-05-24: 4 g via INTRAVENOUS
  Filled 2021-05-24 (×2): qty 100

## 2021-05-24 MED ORDER — LOPERAMIDE HCL 2 MG PO CAPS
2.0000 mg | ORAL_CAPSULE | Freq: Four times a day (QID) | ORAL | Status: DC | PRN
  Administered 2021-05-24 – 2021-05-26 (×7): 2 mg via ORAL
  Filled 2021-05-24 (×7): qty 1

## 2021-05-24 MED ORDER — OXYCODONE HCL 5 MG PO TABS
10.0000 mg | ORAL_TABLET | Freq: Once | ORAL | Status: AC
  Administered 2021-05-24: 10 mg via ORAL
  Filled 2021-05-24: qty 2

## 2021-05-24 NOTE — Progress Notes (Signed)
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  Started on cipro for UTI on 6/9 by pcp  ED Course: CT was concerning for pyelonephritis. sHe was hypotensive bp 99/51, fever 100.5, tachycardic heart rate ranged from 10 1-1 15 , tachypneic respiration rate from 22-28 ,  given fluid boluses x3liters and  started on broad spec abx. Vanc/cefepime/rocephin /flagyl,TRH was called for admission  Subjective:   Reports diarrhea x10time this am, denies ab pain Reports feeling weak Husband at bedside   Assessment & Plan:   Active Problems:   Sepsis ([place])   UTI (urinary tract infection)   Sepsis present on admission with UTI/pyelonephritis/bacteremia -sHe was hypotensive bp 99/51, fever 100.5, tachycardic heart rate ranged from 10 1-1 15 , tachypneic respiration rate from 22-28  -Received Vanc/cefepime/Rocephin/flagyl in the ED, currently Rocephin -Follow-up on culture result, adjust antibiotic accordingly  Diarrhea C. difficile negative As needed Imodium  Hypokalemia/hypomagnesemia Potassium 3.3, magnesium 0.5 on presentation Received supplement, remain low, continue replace, recheck in the morning  Hyponatremia Sodium 130 on presentation, normalized after hydration  AKI on CKD 3B BUN 27 creatinine 2.07 on presentation Improved after hydration and treating sepsis, today BUN 15 creatinine 1.61 Home medication Lasix held, monitor renal function, renal dosing medication  Macrocytic anemia /thrombocytopenia in the setting of malignancy and chemotherapy Hemoglobin 8.7 No  overt sign of bleeding Monitor  Stage IV adenocarcinoma of the bladder  diagnosed in November 2021, currently undergoing chemotherapy with FOLFOX last on May 31, Interval response to therapy with decreasing retroperitoneal lymph node per CT on presentation  underwent bilateral ureteral stent exchange /placement for hydronephrosis on 6 /7, stenting code position per CT on presentation  History of hypertension Presented with hypotension/sepsis Home medication nebivolol, Norvasc and Lasix held on admission Blood pressure improved, likely able to resume Norvasc tomorrow  Hyperlipidemia continue statin  Anxiety/depression  continue Wellbutrin and Abilify   Body mass index is 29.86 kg/m.Marland Kitchen     Unresulted Labs (From admission, onward)     Start     Ordered   05/25/21 0500  CBC with Differential/Platelet  Tomorrow morning,   R        05/24/21 2205   05/25/21 0500  Comprehensive metabolic panel  Tomorrow morning,   R        05/24/21 2205   05/25/21 0500  Magnesium  Tomorrow morning,   R        05/24/21 2205              DVT prophylaxis: SCDs Start: 05/23/21 1514   Code Status: DNR Family Communication: Husband at bedside Disposition:   Status is: Inpatient   Dispo: The patient is from: Home              Anticipated d/c is to: Home              Anticipated d/c date is: 24 to 48 hours, awaiting for final culture result               Consultants:  None  Procedures:  None  Antimicrobials:   Anti-infectives (  From admission, onward)    Start     Dose/Rate Route Frequency Ordered Stop   05/23/21 2200  cefTRIAXone (ROCEPHIN) 2 g in sodium chloride 0.9 % 100 mL IVPB        2 g 200 mL/hr over 30 Minutes Intravenous Every 24 hours 05/23/21 2000     05/23/21 1515  ceFEPIme (MAXIPIME) 2 g in sodium chloride 0.9 % 100 mL IVPB  Status:  Discontinued        2 g 200 mL/hr over 30 Minutes Intravenous  Once 05/23/21 1513 05/23/21 1529   05/23/21 1300  cefTRIAXone (ROCEPHIN)  1 g in sodium chloride 0.9 % 100 mL IVPB  Status:  Discontinued        1 g 200 mL/hr over 30 Minutes Intravenous Every 24 hours 05/23/21 0422 05/23/21 1033   05/23/21 1200  ceFEPIme (MAXIPIME) 2 g in sodium chloride 0.9 % 100 mL IVPB  Status:  Discontinued        2 g 200 mL/hr over 30 Minutes Intravenous Every 12 hours 05/23/21 1033 05/23/21 1035   05/23/21 1200  ceFEPIme (MAXIPIME) 2 g in sodium chloride 0.9 % 100 mL IVPB  Status:  Discontinued        2 g 200 mL/hr over 30 Minutes Intravenous Daily 05/23/21 1035 05/23/21 2000   05/23/21 0115  ceFEPIme (MAXIPIME) 2 g in sodium chloride 0.9 % 100 mL IVPB        2 g 200 mL/hr over 30 Minutes Intravenous STAT 05/23/21 0100 05/23/21 0310   05/23/21 0100  aztreonam (AZACTAM) 2 g in sodium chloride 0.9 % 100 mL IVPB  Status:  Discontinued        2 g 200 mL/hr over 30 Minutes Intravenous  Once 05/23/21 0052 05/23/21 0059   05/23/21 0100  metroNIDAZOLE (FLAGYL) IVPB 500 mg        500 mg 100 mL/hr over 60 Minutes Intravenous  Once 05/23/21 0052 05/23/21 0309   05/23/21 0100  vancomycin (VANCOCIN) IVPB 1000 mg/200 mL premix  Status:  Discontinued        1,000 mg 200 mL/hr over 60 Minutes Intravenous  Once 05/23/21 0052 05/23/21 0058   05/23/21 0100  vancomycin (VANCOREADY) IVPB 1750 mg/350 mL        1,750 mg 175 mL/hr over 120 Minutes Intravenous STAT 05/23/21 0058 05/23/21 0627           Objective: Vitals:   05/24/21 0442 05/24/21 0522 05/24/21 1338 05/24/21 2001  BP: (!) 131/116 (!) 146/70 139/78 (!) 143/88  Pulse: (!) 108 96 83 88  Resp: 18  17 20   Temp: 99.8 F (37.7 C)  98.6 F (37 C) 98.2 F (36.8 C)  TempSrc:   Oral Oral  SpO2: 97%  100% 97%  Weight:      Height:        Intake/Output Summary (Last 24 hours) at 05/24/2021 2205 Last data filed at 05/24/2021 1600 Gross per 24 hour  Intake 1695.36 ml  Output --  Net 1695.36 ml   Filed Weights   05/23/21 0022 05/23/21 1645  Weight: 83.9 kg 83.9 kg     Examination:  General exam: Pale, frail, weak but calm, NAD Respiratory system: Clear to auscultation. Respiratory effort normal. Cardiovascular system: S1 & S2 heard, RRR. No JVD, no murmur, No pedal edema. Gastrointestinal system: Abdomen is nondistended, soft and nontender. Normal bowel sounds heard. Central nervous system: Alert and oriented. No focal neurological deficits. Extremities: Generalized weakness, no edema Skin: No rashes,  lesions or ulcers Psychiatry: Judgement and insight appear normal. Mood & affect appropriate.     Data Reviewed: I have personally reviewed following labs and imaging studies  CBC: Recent Labs  Lab 05/20/21 0826 05/22/21 0000 05/23/21 0100 05/24/21 0551  WBC  --  6.3 5.8 2.9*  NEUTROABS  --  3,994 3.3  --   HGB 9.2* 9.0* 9.4* 8.7*  HCT 27.0* 27.2* 29.1* 26.9*  MCV  --  99.3 102.1* 103.1*  PLT  --  149 125* 102*    Basic Metabolic Panel: Recent Labs  Lab 05/20/21 0826 05/22/21 0000 05/23/21 0100 05/23/21 1045 05/23/21 1715 05/24/21 0551  NA 141 134* 130*  --  133* 135  K 4.1 4.0 3.3*  --  3.4* 3.4*  CL 104 101 100  --  106 107  CO2  --  23 19*  --  15* 20*  GLUCOSE 98 108* 103*  --  112* 108*  BUN 22 29* 27*  --  22 15  CREATININE 1.40* 1.84* 2.07*  --  1.74* 1.61*  CALCIUM  --  8.1* 7.9*  --  7.8* 8.1*  MG  --  1.1*  --  0.5* 1.5* 1.3*  PHOS  --   --   --   --  2.7  --     GFR: Estimated Creatinine Clearance: 39 mL/min (A) (by C-G formula based on SCr of 1.61 mg/dL (H)).  Liver Function Tests: Recent Labs  Lab 05/22/21 0000 05/23/21 0100 05/23/21 1715 05/24/21 0551  AST 15 24  --  44*  ALT 9 17  --  22  ALKPHOS  --  74  --  72  BILITOT 0.3 0.7  --  0.4  PROT 6.5 6.9  --  6.8  ALBUMIN  --  3.4* 3.2* 3.1*    CBG: No results for input(s): GLUCAP in the last 168 hours.   Recent Results (from the past 240 hour(s))  Urine Culture     Status: Abnormal   Collection Time: 05/22/21 12:00 AM   Specimen: Urine   Result Value Ref Range Status   MICRO NUMBER: [id number]  Final   SPECIMEN QUALITY: Adequate  Final   Sample Source URINE  Final   STATUS: FINAL  Final   ISOLATE 1: Escherichia coli (A)  Final    Comment: Greater than 100,000 CFU/mL of Escherichia coli      Susceptibility   Escherichia coli - URINE CULTURE, REFLEX    AMOX/CLAVULANIC 8 Sensitive     AMPICILLIN >=32 Resistant     AMPICILLIN/SULBACTAM 16 Intermediate     CEFAZOLIN* <=4 Not Reportable      * For infections other than uncomplicated UTI caused by E. coli, K. pneumoniae or P. mirabilis: Cefazolin is resistant if MIC > or = 8 mcg/mL. (Distinguishing susceptible versus intermediate for isolates with MIC < or = 4 mcg/mL requires additional testing.) For uncomplicated UTI caused by E. coli, K. pneumoniae or P. mirabilis: Cefazolin is susceptible if MIC <32 mcg/mL and predicts susceptible to the oral agents cefaclor, cefdinir, cefpodoxime, cefprozil, cefuroxime, cephalexin and loracarbef.     CEFEPIME <=1 Sensitive     CEFTRIAXONE <=1 Sensitive     CIPROFLOXACIN >=4 Resistant     LEVOFLOXACIN >=8 Resistant     ERTAPENEM <=0.5 Sensitive     GENTAMICIN <=1 Sensitive     IMIPENEM <=0.25 Sensitive     NITROFURANTOIN <=16 Sensitive     PIP/TAZO <=4 Sensitive     TOBRAMYCIN <=1 Sensitive  TRIMETH/SULFA* <=20 Sensitive      * For infections other than uncomplicated UTI caused by E. coli, K. pneumoniae or P. mirabilis: Cefazolin is resistant if MIC > or = 8 mcg/mL. (Distinguishing susceptible versus intermediate for isolates with MIC < or = 4 mcg/mL requires additional testing.) For uncomplicated UTI caused by E. coli, K. pneumoniae or P. mirabilis: Cefazolin is susceptible if MIC <32 mcg/mL and predicts susceptible to the oral agents cefaclor, cefdinir, cefpodoxime, cefprozil, cefuroxime, cephalexin and loracarbef. Legend: S = Susceptible  I = Intermediate R = Resistant  NS = Not susceptible * = Not tested  NR =  Not reported **NN = See antimicrobic comments   Culture, blood (Routine x 2)     Status: None (Preliminary result)   Collection Time: 05/23/21  1:00 AM   Specimen: BLOOD  Result Value Ref Range Status   Specimen Description   Final    BLOOD LEFT ANTECUBITAL Performed at [facility] [address] [place], [place] [address]    Special Requests   Final    BOTTLES DRAWN AEROBIC AND ANAEROBIC Blood Culture adequate volume Performed at [facility] [address] [place], [place] [address]    Culture  Setup Time   Final    GRAM NEGATIVE RODS IN BOTH AEROBIC AND ANAEROBIC BOTTLES CRITICAL VALUE NOTED.  VALUE IS CONSISTENT WITH PREVIOUSLY REPORTED AND CALLED VALUE.    Culture   Final    GRAM NEGATIVE RODS IDENTIFICATION TO FOLLOW Performed at [facility], [place] [address], [place], [place] [address]    Report Status PENDING  Incomplete  Resp Panel by RT-PCR (Flu A&B, Covid) Nasopharyngeal Swab     Status: None   Collection Time: 05/23/21  1:00 AM   Specimen: Nasopharyngeal Swab; Nasopharyngeal(NP) swabs in vial transport medium  Result Value Ref Range Status   SARS Coronavirus 2 by RT PCR NEGATIVE NEGATIVE Final    Comment: (NOTE) SARS-CoV-2 target nucleic acids are NOT DETECTED.  The SARS-CoV-2 RNA is generally detectable in upper respiratory specimens during the acute phase of infection. The lowest concentration of SARS-CoV-2 viral copies this assay can detect is 138 copies/mL. A negative result does not preclude SARS-Cov-2 infection and should not be used as the sole basis for treatment or other patient management decisions. A negative result may occur with  improper specimen collection/handling, submission of specimen other than nasopharyngeal swab, presence of viral mutation(s) within the areas targeted by this assay, and inadequate number of viral copies(<138 copies/mL). A negative result must be combined with clinical  observations, patient history, and epidemiological information. The expected result is Negative.  Fact Sheet for Patients:  EntrepreneurPulse.com.au  Fact Sheet for Healthcare Providers:  IncredibleEmployment.be  This test is no t yet approved or cleared by the [place] FDA and  has been authorized for detection and/or diagnosis of SARS-CoV-2 by FDA under an Emergency Use Authorization (EUA). This EUA will remain  in effect (meaning this test can be used) for the duration of the COVID-19 declaration under Section 564(b)(1) of the Act, 21 U.S.C.section 360bbb-3(b)(1), unless the authorization is terminated  or revoked sooner.       Influenza A by PCR NEGATIVE NEGATIVE Final   Influenza B by PCR NEGATIVE NEGATIVE Final    Comment: (NOTE) The Xpert Xpress SARS-CoV-2/FLU/RSV plus assay is intended as an aid in the diagnosis of influenza from Nasopharyngeal swab specimens and should not be used as a sole basis for treatment. Nasal washings  and aspirates are unacceptable for Xpert Xpress SARS-CoV-2/FLU/RSV testing.  Fact Sheet for Patients: EntrepreneurPulse.com.au  Fact Sheet for Healthcare Providers: IncredibleEmployment.be  This test is not yet approved or cleared by the [place] FDA and has been authorized for detection and/or diagnosis of SARS-CoV-2 by FDA under an Emergency Use Authorization (EUA). This EUA will remain in effect (meaning this test can be used) for the duration of the COVID-19 declaration under Section 564(b)(1) of the Act, 21 U.S.C. section 360bbb-3(b)(1), unless the authorization is terminated or revoked.  Performed at [facility], [place] [address] [place], [place] [address]   Culture, blood (Routine x 2)     Status: Abnormal (Preliminary result)   Collection Time: 05/23/21  1:04 AM   Specimen: BLOOD LEFT HAND  Result Value Ref Range Status   Specimen  Description   Final    BLOOD LEFT HAND Performed at [facility] [address], [place], [place] [address]    Special Requests   Final    BOTTLES DRAWN AEROBIC AND ANAEROBIC Blood Culture adequate volume Performed at [facility] [address], [place], [place] [address]    Culture  Setup Time   Final    GRAM NEGATIVE RODS IN BOTH AEROBIC AND ANAEROBIC BOTTLES CRITICAL RESULT CALLED TO, READ BACK BY AND VERIFIED WITH: TERRI GREEN PHARMD @1918  05/23/21 EB    Culture (A)  Final    ESCHERICHIA COLI SUSCEPTIBILITIES TO FOLLOW Performed at [facility], [place] [address], [place], [place] [address]    Report Status PENDING  Incomplete  Blood Culture ID Panel (Reflexed)     Status: Abnormal   Collection Time: 05/23/21  1:04 AM  Result Value Ref Range Status   Enterococcus faecalis NOT DETECTED NOT DETECTED Final   Enterococcus Faecium NOT DETECTED NOT DETECTED Final   Listeria monocytogenes NOT DETECTED NOT DETECTED Final   Staphylococcus species NOT DETECTED NOT DETECTED Final   Staphylococcus aureus (BCID) NOT DETECTED NOT DETECTED Final   Staphylococcus epidermidis NOT DETECTED NOT DETECTED Final   Staphylococcus lugdunensis NOT DETECTED NOT DETECTED Final   Streptococcus species NOT DETECTED NOT DETECTED Final   Streptococcus agalactiae NOT DETECTED NOT DETECTED Final   Streptococcus pneumoniae NOT DETECTED NOT DETECTED Final   Streptococcus pyogenes NOT DETECTED NOT DETECTED Final   A.calcoaceticus-baumannii NOT DETECTED NOT DETECTED Final   Bacteroides fragilis NOT DETECTED NOT DETECTED Final   Enterobacterales DETECTED (A) NOT DETECTED Final    Comment: Enterobacterales represent a large order of gram negative bacteria, not a single organism. CRITICAL RESULT CALLED TO, READ BACK BY AND VERIFIED WITH: TERRI GREEN PHARMD @1918  05/23/21 EB    Enterobacter cloacae complex NOT DETECTED NOT DETECTED Final   Escherichia coli DETECTED (A)  NOT DETECTED Final    Comment: CRITICAL RESULT CALLED TO, READ BACK BY AND VERIFIED WITH: TERRI GREEN PHARMD @1918  05/23/21 EB    Klebsiella aerogenes NOT DETECTED NOT DETECTED Final   Klebsiella oxytoca NOT DETECTED NOT DETECTED Final   Klebsiella pneumoniae NOT DETECTED NOT DETECTED Final   Proteus species NOT DETECTED NOT DETECTED Final   Salmonella species NOT DETECTED NOT DETECTED Final   Serratia marcescens NOT DETECTED NOT DETECTED Final   Haemophilus influenzae NOT DETECTED NOT DETECTED Final   Neisseria meningitidis NOT DETECTED NOT DETECTED Final   Pseudomonas aeruginosa NOT DETECTED NOT DETECTED Final   Stenotrophomonas maltophilia NOT DETECTED NOT DETECTED Final   Candida albicans NOT DETECTED NOT DETECTED Final  Candida auris NOT DETECTED NOT DETECTED Final   Candida glabrata NOT DETECTED NOT DETECTED Final   Candida krusei NOT DETECTED NOT DETECTED Final   Candida parapsilosis NOT DETECTED NOT DETECTED Final   Candida tropicalis NOT DETECTED NOT DETECTED Final   Cryptococcus neoformans/gattii NOT DETECTED NOT DETECTED Final   CTX-M ESBL NOT DETECTED NOT DETECTED Final   Carbapenem resistance IMP NOT DETECTED NOT DETECTED Final   Carbapenem resistance KPC NOT DETECTED NOT DETECTED Final   Carbapenem resistance NDM NOT DETECTED NOT DETECTED Final   Carbapenem resist OXA 48 LIKE NOT DETECTED NOT DETECTED Final   Carbapenem resistance VIM NOT DETECTED NOT DETECTED Final    Comment: Performed at [facility] [address] [place], [place] [address]  Urine culture     Status: Abnormal (Preliminary result)   Collection Time: 05/23/21  2:37 AM   Specimen: In/Out Cath Urine  Result Value Ref Range Status   Specimen Description   Final    IN/OUT CATH URINE Performed at [facility] [address] [place], [place] [address]    Special Requests   Final    NONE Performed at [facility], [place] [address], [place], [place]  [address]    Culture (A)  Final    20,000 COLONIES/mL ESCHERICHIA COLI SUSCEPTIBILITIES TO FOLLOW Performed at [facility], [place] [address], [place], [place] [address]    Report Status PENDING  Incomplete  C Difficile Quick Screen w PCR reflex     Status: None   Collection Time: 05/24/21 11:16 AM   Specimen: STOOL  Result Value Ref Range Status   C Diff antigen NEGATIVE NEGATIVE Final   C Diff toxin NEGATIVE NEGATIVE Final   C Diff interpretation No C. difficile detected.  Final    Comment: NEGATIVE Performed at [facility], [place] [address], [place], [place] [address]          Radiology Studies: CT ABDOMEN PELVIS WO CONTRAST  Result Date: 05/23/2021 CLINICAL DATA:  Fever, abdominal pain, metastatic urothelial carcinoma undergoing chemotherapy,. EXAM: CT ABDOMEN AND PELVIS WITHOUT CONTRAST TECHNIQUE: Multidetector CT imaging of the abdomen and pelvis was performed following the standard protocol without IV contrast. COMPARISON:  03/27/2021 FINDINGS: Lower chest: There is subpleural reticulation and mild ground-glass pulmonary infiltrate again noted within the lung bases bilaterally most in keeping with organizing pneumonia and fibrosis related to remote COVID-19 pneumonia. These changes appear stable since prior examination. Stable 6 mm pulmonary nodule within the a subpleural right lower lobe, axial image # 9. No pleural effusion. The visualized heart and pericardium are unremarkable. Small hiatal hernia. Hepatobiliary: No focal liver abnormality is seen. No gallstones, gallbladder wall thickening, or biliary dilatation. Pancreas: Unremarkable Spleen: Unremarkable Adrenals/Urinary Tract: The adrenal glands are unremarkable. The kidneys are normal in size and position. Bilateral double-J ureteral stents are in expected position, unchanged from prior examination. Left-sided hydronephrosis has resolved. Fullness and soft tissue infiltration of the renal pelves bilaterally  appears stable. There is increasing mild left perinephric stranding, possibly related to superimposed inflammation. The bladder is unremarkable. Stomach/Bowel: Stomach, small bowel, and large bowel are unremarkable. The appendix is normal. No free intraperitoneal gas or fluid. Vascular/Lymphatic: Mild aortoiliac atherosclerotic calcification. No aortic aneurysm. Pathologic adenopathy within the retroperitoneum has improved in the interval in keeping with response to therapy. Index left retroperitoneal lymph node measures 1.8 x 2.2 cm (previously 2.7 x 3.0 cm). No new pathologic adenopathy within the abdomen and pelvis.  Reproductive: Uterus and bilateral adnexa are unremarkable. Other: Tiny fat containing umbilical hernia.  Rectum unremarkable. Musculoskeletal: No acute bone abnormality. No lytic or blastic bone lesions. Osseous structures are age-appropriate. IMPRESSION: Bilateral double-J ureteral stents are unchanged in position and left sided hydronephrosis has resolved. Stable fullness and soft tissue infiltration within the renal pelves bilaterally, not optimally assessed on this noncontrast examination but likely representing a combination of malignant disease and inflammatory reaction related to chronic catheterization. However, superimposed perinephric stranding on the left may represent a superimposed inflammatory process such as pyelonephritis and correlation with urinalysis and urine culture may be helpful. Interval response to therapy with decreasing retroperitoneal lymph node. Index lymph node as outlined above. Stable bibasilar pulmonary changes most in keeping with remote COVID-19 pneumonia and residual organizing pneumonia/fibrosis. Stable 6 mm indeterminate right basilar noncalcified pulmonary nodule. Aortic Atherosclerosis (ICD10-I70.0). Electronically Signed   By: Fidela Salisbury MD   On: 05/23/2021 04:01   DG Chest Port 1 View  Result Date: 05/23/2021 CLINICAL DATA:  Questionable sepsis.   Fever. EXAM: PORTABLE CHEST 1 VIEW COMPARISON:  Chest x-ray 04/30/2021, CT chest 03/27/2021 FINDINGS: Right chest wall Port-A-Cath with tip overlying the expected region of the superior cavoatrial junction. The heart size and mediastinal contours are unchanged. Similar bilateral linear atelectasis/scarring. No focal consolidation. No pulmonary edema. No pleural effusion. No pneumothorax. No acute osseous abnormality. IMPRESSION: No active disease. Electronically Signed   By: Iven Finn M.D.   On: 05/23/2021 02:40        Scheduled Meds:  potassium chloride  40 mEq Oral Daily   Continuous Infusions:  sodium chloride 75 mL/hr at 05/24/21 1407   cefTRIAXone (ROCEPHIN)  IV 2 g (05/23/21 2140)     LOS: 0 days   Time spent: 35 mins Greater than 50% of this time was spent in counseling, explanation of diagnosis, planning of further management, and coordination of care.   Voice Recognition Viviann Spare dictation system was used to create this note, attempts have been made to correct errors. Please contact the author with questions and/or clarifications.   Florencia Reasons, MD PhD FACP [facility]  Available via Epic secure chat 7am-7pm for nonurgent issues Please page for urgent issues To page the attending provider between 7A-7P or the covering provider during after hours 7P-7A, please log into the web site [email or web address] and access using universal [place] password for that web site. If you do not have the password, please call the hospital operator.    05/24/2021, 10:05 PM

## 2021-05-25 LAB — CBC WITH DIFFERENTIAL/PLATELET
Abs Immature Granulocytes: 0.12 10*3/uL — ABNORMAL HIGH (ref 0.00–0.07)
Abs Immature Granulocytes: 0.33 10*3/uL — ABNORMAL HIGH (ref 0.00–0.07)
Basophils Absolute: 0 10*3/uL (ref 0.0–0.1)
Basophils Absolute: 0 10*3/uL (ref 0.0–0.1)
Basophils Relative: 0 %
Basophils Relative: 0 %
Eosinophils Absolute: 0 10*3/uL (ref 0.0–0.5)
Eosinophils Absolute: 0 10*3/uL (ref 0.0–0.5)
Eosinophils Relative: 0 %
Eosinophils Relative: 0 %
HCT: 26.3 % — ABNORMAL LOW (ref 36.0–46.0)
HCT: 23.9 % — ABNORMAL LOW (ref 36.0–46.0)
Hemoglobin: 8.5 g/dL — ABNORMAL LOW (ref 12.0–15.0)
Hemoglobin: 7.7 g/dL — ABNORMAL LOW (ref 12.0–15.0)
Immature Granulocytes: 3 %
Immature Granulocytes: 6 %
Lymphocytes Relative: 25 %
Lymphocytes Relative: 26 %
Lymphs Abs: 0.9 10*3/uL (ref 0.7–4.0)
Lymphs Abs: 1.3 10*3/uL (ref 0.7–4.0)
MCH: 32.8 pg (ref 26.0–34.0)
MCH: 32.4 pg (ref 26.0–34.0)
MCHC: 32.3 g/dL (ref 30.0–36.0)
MCHC: 32.2 g/dL (ref 30.0–36.0)
MCV: 101.5 fL — ABNORMAL HIGH (ref 80.0–100.0)
MCV: 100.4 fL — ABNORMAL HIGH (ref 80.0–100.0)
Monocytes Absolute: 1.3 10*3/uL — ABNORMAL HIGH (ref 0.1–1.0)
Monocytes Absolute: 2.2 10*3/uL — ABNORMAL HIGH (ref 0.1–1.0)
Monocytes Relative: 36 %
Monocytes Relative: 43 %
Neutro Abs: 1.3 10*3/uL — ABNORMAL LOW (ref 1.7–7.7)
Neutro Abs: 1.3 10*3/uL — ABNORMAL LOW (ref 1.7–7.7)
Neutrophils Relative %: 36 %
Neutrophils Relative %: 25 %
Platelets: 98 10*3/uL — ABNORMAL LOW (ref 150–400)
Platelets: 114 10*3/uL — ABNORMAL LOW (ref 150–400)
RBC: 2.59 MIL/uL — ABNORMAL LOW (ref 3.87–5.11)
RBC: 2.38 MIL/uL — ABNORMAL LOW (ref 3.87–5.11)
RDW: 17.2 % — ABNORMAL HIGH (ref 11.5–15.5)
RDW: 17.2 % — ABNORMAL HIGH (ref 11.5–15.5)
WBC: 3.7 10*3/uL — ABNORMAL LOW (ref 4.0–10.5)
WBC: 5.2 10*3/uL (ref 4.0–10.5)
nRBC: 1.1 % — ABNORMAL HIGH (ref 0.0–0.2)
nRBC: 1.2 % — ABNORMAL HIGH (ref 0.0–0.2)

## 2021-05-25 LAB — COMPREHENSIVE METABOLIC PANEL
ALT: 23 U/L (ref 0–44)
AST: 37 U/L (ref 15–41)
Albumin: 3.2 g/dL — ABNORMAL LOW (ref 3.5–5.0)
Alkaline Phosphatase: 72 U/L (ref 38–126)
Anion gap: 12 (ref 5–15)
BUN: 16 mg/dL (ref 8–23)
CO2: 17 mmol/L — ABNORMAL LOW (ref 22–32)
Calcium: 8.2 mg/dL — ABNORMAL LOW (ref 8.9–10.3)
Chloride: 104 mmol/L (ref 98–111)
Creatinine, Ser: 1.77 mg/dL — ABNORMAL HIGH (ref 0.44–1.00)
GFR, Estimated: 32 mL/min — ABNORMAL LOW (ref >60)
Glucose, Bld: 107 mg/dL — ABNORMAL HIGH (ref 70–99)
Potassium: 3.2 mmol/L — ABNORMAL LOW (ref 3.5–5.1)
Sodium: 133 mmol/L — ABNORMAL LOW (ref 135–145)
Total Bilirubin: 0.5 mg/dL (ref 0.3–1.2)
Total Protein: 7.2 g/dL (ref 6.5–8.1)

## 2021-05-25 LAB — URINE CULTURE: Culture: 20000 — AB

## 2021-05-25 LAB — CULTURE, BLOOD (ROUTINE X 2): Special Requests: ADEQUATE

## 2021-05-25 LAB — MAGNESIUM: Magnesium: 2 mg/dL (ref 1.7–2.4)

## 2021-05-25 MED ORDER — BUPROPION HCL ER (XL) 150 MG PO TB24
150.0000 mg | ORAL_TABLET | Freq: Every day | ORAL | Status: DC
  Administered 2021-05-25 – 2021-05-27 (×3): 150 mg via ORAL
  Filled 2021-05-25 (×3): qty 1

## 2021-05-25 MED ORDER — SODIUM CHLORIDE 0.9% FLUSH
10.0000 mL | Freq: Two times a day (BID) | INTRAVENOUS | Status: DC
  Administered 2021-05-27: 10 mL

## 2021-05-25 MED ORDER — NEBIVOLOL HCL 10 MG PO TABS
10.0000 mg | ORAL_TABLET | Freq: Every day | ORAL | Status: DC
  Administered 2021-05-25 – 2021-05-27 (×3): 10 mg via ORAL
  Filled 2021-05-25 (×3): qty 1

## 2021-05-25 MED ORDER — SODIUM CHLORIDE 0.9% FLUSH: 10.0000 mL | INTRAVENOUS | Status: DC | PRN

## 2021-05-25 MED ORDER — BUPROPION HCL ER (XL) 300 MG PO TB24: 300.0000 mg | ORAL_TABLET | Freq: Every day | ORAL | Status: DC

## 2021-05-25 MED ORDER — CHLORHEXIDINE GLUCONATE CLOTH 2 % EX PADS
6.0000 | MEDICATED_PAD | Freq: Every day | CUTANEOUS | Status: DC
  Administered 2021-05-26: 6 via TOPICAL

## 2021-05-25 NOTE — Progress Notes (Signed)
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  Started on cipro for UTI on 6/9 by pcp  ED Course: CT was concerning for pyelonephritis. sHe was hypotensive bp 99/51, fever 100.5, tachycardic heart rate ranged from 10 1-1 15 , tachypneic respiration rate from 22-28 ,  given fluid boluses x3liters and  started on broad spec abx. Vanc/cefepime/rocephin /flagyl,TRH was called for admission  Subjective:  Continue to have diarrhea Reports feeling weak, no appetite,  No fever bilateral lower extremity start to have some edema Husband at bedside   Assessment & Plan:   Active Problems:   Sepsis ([place])   UTI (urinary tract infection)   Sepsis present on admission with UTI/pyelonephritis/bacteremia -sHe was hypotensive bp 99/51, fever 100.5, tachycardic heart rate ranged from 10 1-1 15 , tachypneic respiration rate from 22-28 on presentation -Received Vanc/cefepime/Rocephin/flagyl in the ED, currently Rocephin -plan to transition of oral abx tomorrow  Diarrhea C. difficile negative, gi pcr panel negative As needed Imodium  Hypokalemia/hypomagnesemia Potassium 3.3, magnesium 0.5 on presentation Mag normalized, continue replace k recheck in the morning  Hyponatremia Sodium 130 on presentation, received hydration, improved,   encourage oral intake, will now restart hydration due to started to have some lower extremity edema monitor   AKI on CKD 3B BUN 27 creatinine 2.07 on presentation Improved after hydration and treating sepsis Home medication Lasix held, monitor  renal function, renal dosing medication  Macrocytic anemia /thrombocytopenia in the setting of malignancy and chemotherapy Hemoglobin 8.7, plt trending down No overt sign of bleeding Monitor  Stage IV adenocarcinoma of the bladder  diagnosed in November 2021, currently undergoing chemotherapy with FOLFOX last on May 31, Interval response to therapy with decreasing retroperitoneal lymph node per CT on presentation  underwent bilateral ureteral stent exchange /placement for hydronephrosis on 6 /7, stent in good  position per CT on presentation  History of hypertension Presented with hypotension/sepsis Home medication nebivolol, Norvasc and Lasix held on admission Blood pressure improved, resume low dose nebivolol,  continue hold norvasc ( reports leg edema), lasix   Hyperlipidemia continue statin  Anxiety/depression  continue Wellbutrin  (renally dosed ) Continue Abilify   Body mass index is 29.86 kg/m.[place] Kitchen     Unresulted Labs (From admission, onward)     Start     Ordered   05/26/21 5638  Basic metabolic panel  Tomorrow morning,   R        05/25/21 1839   05/26/21 0500  Magnesium  Tomorrow morning,   R        05/25/21 1839   05/25/21 1840  CBC with Differential/Platelet  Once,   R        05/25/21 1839   05/25/21 0810  Gastrointestinal Panel by PCR , Stool  (Gastrointestinal Panel by PCR, Stool                                                                                                                                                     **  Does Not include CLOSTRIDIUM DIFFICILE testing. **If CDIFF testing is needed, place order from the "C Difficile Testing" order set.**)  Once,   R        05/25/21 0809              DVT prophylaxis: SCDs Start: 05/23/21 1514   Code Status: DNR Family Communication: Husband at bedside Disposition:   Status is: Inpatient   Dispo: The patient is from: Home              Anticipated d/c is to: Home              Anticipated d/c date  is: 24 to 48 hours, awaiting for final culture result               Consultants:  None  Procedures:  None  Antimicrobials:   Anti-infectives (From admission, onward)    Start     Dose/Rate Route Frequency Ordered Stop   05/23/21 2200  cefTRIAXone (ROCEPHIN) 2 g in sodium chloride 0.9 % 100 mL IVPB        2 g 200 mL/hr over 30 Minutes Intravenous Every 24 hours 05/23/21 2000     05/23/21 1515  ceFEPIme (MAXIPIME) 2 g in sodium chloride 0.9 % 100 mL IVPB  Status:  Discontinued        2 g 200 mL/hr over 30 Minutes Intravenous  Once 05/23/21 1513 05/23/21 1529   05/23/21 1300  cefTRIAXone (ROCEPHIN) 1 g in sodium chloride 0.9 % 100 mL IVPB  Status:  Discontinued        1 g 200 mL/hr over 30 Minutes Intravenous Every 24 hours 05/23/21 0422 05/23/21 1033   05/23/21 1200  ceFEPIme (MAXIPIME) 2 g in sodium chloride 0.9 % 100 mL IVPB  Status:  Discontinued        2 g 200 mL/hr over 30 Minutes Intravenous Every 12 hours 05/23/21 1033 05/23/21 1035   05/23/21 1200  ceFEPIme (MAXIPIME) 2 g in sodium chloride 0.9 % 100 mL IVPB  Status:  Discontinued        2 g 200 mL/hr over 30 Minutes Intravenous Daily 05/23/21 1035 05/23/21 2000   05/23/21 0115  ceFEPIme (MAXIPIME) 2 g in sodium chloride 0.9 % 100 mL IVPB        2 g 200 mL/hr over 30 Minutes Intravenous STAT 05/23/21 0100 05/23/21 0310   05/23/21 0100  aztreonam (AZACTAM) 2 g in sodium chloride 0.9 % 100 mL IVPB  Status:  Discontinued        2 g 200 mL/hr over 30 Minutes Intravenous  Once 05/23/21 0052 05/23/21 0059   05/23/21 0100  metroNIDAZOLE (FLAGYL) IVPB 500 mg        500 mg 100 mL/hr over 60 Minutes Intravenous  Once 05/23/21 0052 05/23/21 0309   05/23/21 0100  vancomycin (VANCOCIN) IVPB 1000 mg/200 mL premix  Status:  Discontinued        1,000 mg 200 mL/hr over 60 Minutes Intravenous  Once 05/23/21 0052 05/23/21 0058   05/23/21 0100  vancomycin (VANCOREADY) IVPB 1750 mg/350 mL        1,750 mg 175 mL/hr over 120 Minutes  Intravenous STAT 05/23/21 0058 05/23/21 0627           Objective: Vitals:   05/24/21 2001 05/25/21 0208 05/25/21 0607 05/25/21 1516  BP: (!) 143/88 (!) 173/85 (!) 147/84 135/81  Pulse: 88 80 91 82  Resp: 20 20 20 18   Temp: 98.2 F (36.8 C)  97.7 F (36.5 C) 98.8 F (37.1 C) 98.2 F (36.8 C)  TempSrc: Oral Oral Oral Oral  SpO2: 97% 100% 97% 100%  Weight:      Height:        Intake/Output Summary (Last 24 hours) at 05/25/2021 1839 Last data filed at 05/25/2021 1500 Gross per 24 hour  Intake 240 ml  Output 1 ml  Net 239 ml   Filed Weights   05/23/21 0022 05/23/21 1645  Weight: 83.9 kg 83.9 kg    Examination:  General exam: Pale, frail, weak but calm, NAD Respiratory system: Clear to auscultation. Respiratory effort normal. Cardiovascular system: S1 & S2 heard, RRR. No JVD, no murmur, No pedal edema. Gastrointestinal system: Abdomen is nondistended, soft and nontender. Normal bowel sounds heard. Central nervous system: Alert and oriented. No focal neurological deficits. Extremities: Generalized weakness, developing trace bilateral lower extremity edema Skin: No rashes, lesions or ulcers Psychiatry: Judgement and insight appear normal. Mood & affect appropriate.     Data Reviewed: I have personally reviewed following labs and imaging studies  CBC: Recent Labs  Lab 05/20/21 0826 05/22/21 0000 05/23/21 0100 05/24/21 0551 05/25/21 0506  WBC  --  6.3 5.8 2.9* 3.7*  NEUTROABS  --  3,994 3.3  --  1.3*  HGB 9.2* 9.0* 9.4* 8.7* 8.5*  HCT 27.0* 27.2* 29.1* 26.9* 26.3*  MCV  --  99.3 102.1* 103.1* 101.5*  PLT  --  149 125* 102* 98*    Basic Metabolic Panel: Recent Labs  Lab 05/22/21 0000 05/23/21 0100 05/23/21 1045 05/23/21 1715 05/24/21 0551 05/25/21 0506  NA 134* 130*  --  133* 135 133*  K 4.0 3.3*  --  3.4* 3.4* 3.2*  CL 101 100  --  106 107 104  CO2 23 19*  --  15* 20* 17*  GLUCOSE 108* 103*  --  112* 108* 107*  BUN 29* 27*  --  22 15 16    CREATININE 1.84* 2.07*  --  1.74* 1.61* 1.77*  CALCIUM 8.1* 7.9*  --  7.8* 8.1* 8.2*  MG 1.1*  --  0.5* 1.5* 1.3* 2.0  PHOS  --   --   --  2.7  --   --     GFR: Estimated Creatinine Clearance: 35.5 mL/min (A) (by C-G formula based on SCr of 1.77 mg/dL (H)).  Liver Function Tests: Recent Labs  Lab 05/22/21 0000 05/23/21 0100 05/23/21 1715 05/24/21 0551 05/25/21 0506  AST 15 24  --  44* 37  ALT 9 17  --  22 23  ALKPHOS  --  74  --  72 72  BILITOT 0.3 0.7  --  0.4 0.5  PROT 6.5 6.9  --  6.8 7.2  ALBUMIN  --  3.4* 3.2* 3.1* 3.2*    CBG: No results for input(s): GLUCAP in the last 168 hours.   Recent Results (from the past 240 hour(s))  Urine Culture     Status: Abnormal   Collection Time: 05/22/21 12:00 AM   Specimen: Urine  Result Value Ref Range Status   MICRO NUMBER: [id number]  Final   SPECIMEN QUALITY: Adequate  Final   Sample Source URINE  Final   STATUS: FINAL  Final   ISOLATE 1: Escherichia coli (A)  Final    Comment: Greater than 100,000 CFU/mL of Escherichia coli      Susceptibility   Escherichia coli - URINE CULTURE, REFLEX    AMOX/CLAVULANIC 8 Sensitive     AMPICILLIN >=32 Resistant  AMPICILLIN/SULBACTAM 16 Intermediate     CEFAZOLIN* <=4 Not Reportable      * For infections other than uncomplicated UTI caused by E. coli, K. pneumoniae or P. mirabilis: Cefazolin is resistant if MIC > or = 8 mcg/mL. (Distinguishing susceptible versus intermediate for isolates with MIC < or = 4 mcg/mL requires additional testing.) For uncomplicated UTI caused by E. coli, K. pneumoniae or P. mirabilis: Cefazolin is susceptible if MIC <32 mcg/mL and predicts susceptible to the oral agents cefaclor, cefdinir, cefpodoxime, cefprozil, cefuroxime, cephalexin and loracarbef.     CEFEPIME <=1 Sensitive     CEFTRIAXONE <=1 Sensitive     CIPROFLOXACIN >=4 Resistant     LEVOFLOXACIN >=8 Resistant     ERTAPENEM <=0.5 Sensitive     GENTAMICIN <=1 Sensitive     IMIPENEM  <=0.25 Sensitive     NITROFURANTOIN <=16 Sensitive     PIP/TAZO <=4 Sensitive     TOBRAMYCIN <=1 Sensitive     TRIMETH/SULFA* <=20 Sensitive      * For infections other than uncomplicated UTI caused by E. coli, K. pneumoniae or P. mirabilis: Cefazolin is resistant if MIC > or = 8 mcg/mL. (Distinguishing susceptible versus intermediate for isolates with MIC < or = 4 mcg/mL requires additional testing.) For uncomplicated UTI caused by E. coli, K. pneumoniae or P. mirabilis: Cefazolin is susceptible if MIC <32 mcg/mL and predicts susceptible to the oral agents cefaclor, cefdinir, cefpodoxime, cefprozil, cefuroxime, cephalexin and loracarbef. Legend: S = Susceptible  I = Intermediate R = Resistant  NS = Not susceptible * = Not tested  NR = Not reported **NN = See antimicrobic comments   Culture, blood (Routine x 2)     Status: Abnormal (Preliminary result)   Collection Time: 05/23/21  1:00 AM   Specimen: BLOOD  Result Value Ref Range Status   Specimen Description   Final    BLOOD LEFT ANTECUBITAL Performed at [facility] [address], [place], [place] [address]    Special Requests   Final    BOTTLES DRAWN AEROBIC AND ANAEROBIC Blood Culture adequate volume Performed at [facility] [address], [place], [place] [address]    Culture  Setup Time   Final    GRAM NEGATIVE RODS IN BOTH AEROBIC AND ANAEROBIC BOTTLES CRITICAL VALUE NOTED.  VALUE IS CONSISTENT WITH PREVIOUSLY REPORTED AND CALLED VALUE.    Culture (A)  Final    ESCHERICHIA COLI SUSCEPTIBILITIES PERFORMED ON PREVIOUS CULTURE WITHIN THE LAST 5 DAYS. Performed at [facility], [place] [address] [place], [place] [address]    Report Status PENDING  Incomplete  Resp Panel by RT-PCR (Flu A&B, Covid) Nasopharyngeal Swab     Status: None   Collection Time: 05/23/21  1:00 AM   Specimen: Nasopharyngeal Swab; Nasopharyngeal(NP) swabs in vial transport medium  Result Value Ref  Range Status   SARS Coronavirus 2 by RT PCR NEGATIVE NEGATIVE Final    Comment: (NOTE) SARS-CoV-2 target nucleic acids are NOT DETECTED.  The SARS-CoV-2 RNA is generally detectable in upper respiratory specimens during the acute phase of infection. The lowest concentration of SARS-CoV-2 viral copies this assay can detect is 138 copies/mL. A negative result does not preclude SARS-Cov-2 infection and should not be used as the sole basis for treatment or other patient management decisions. A negative result may occur with  improper specimen collection/handling, submission of specimen other than nasopharyngeal swab, presence of viral mutation(s) within the areas targeted by this assay, and  inadequate number of viral copies(<138 copies/mL). A negative result must be combined with clinical observations, patient history, and epidemiological information. The expected result is Negative.  Fact Sheet for Patients:  EntrepreneurPulse.com.au  Fact Sheet for Healthcare Providers:  IncredibleEmployment.be  This test is no t yet approved or cleared by the [place] FDA and  has been authorized for detection and/or diagnosis of SARS-CoV-2 by FDA under an Emergency Use Authorization (EUA). This EUA will remain  in effect (meaning this test can be used) for the duration of the COVID-19 declaration under Section 564(b)(1) of the Act, 21 U.S.C.section 360bbb-3(b)(1), unless the authorization is terminated  or revoked sooner.       Influenza A by PCR NEGATIVE NEGATIVE Final   Influenza B by PCR NEGATIVE NEGATIVE Final    Comment: (NOTE) The Xpert Xpress SARS-CoV-2/FLU/RSV plus assay is intended as an aid in the diagnosis of influenza from Nasopharyngeal swab specimens and should not be used as a sole basis for treatment. Nasal washings and aspirates are unacceptable for Xpert Xpress SARS-CoV-2/FLU/RSV testing.  Fact Sheet for  Patients: EntrepreneurPulse.com.au  Fact Sheet for Healthcare Providers: IncredibleEmployment.be  This test is not yet approved or cleared by the [place] FDA and has been authorized for detection and/or diagnosis of SARS-CoV-2 by FDA under an Emergency Use Authorization (EUA). This EUA will remain in effect (meaning this test can be used) for the duration of the COVID-19 declaration under Section 564(b)(1) of the Act, 21 U.S.C. section 360bbb-3(b)(1), unless the authorization is terminated or revoked.  Performed at [facility], [place] [address] [place], [place] [address]   Culture, blood (Routine x 2)     Status: Abnormal   Collection Time: 05/23/21  1:04 AM   Specimen: BLOOD LEFT HAND  Result Value Ref Range Status   Specimen Description   Final    BLOOD LEFT HAND Performed at [facility] [address] [place], [place] [address]    Special Requests   Final    BOTTLES DRAWN AEROBIC AND ANAEROBIC Blood Culture adequate volume Performed at [facility] [address] [place], [place] [address]    Culture  Setup Time   Final    GRAM NEGATIVE RODS IN BOTH AEROBIC AND ANAEROBIC BOTTLES CRITICAL RESULT CALLED TO, READ BACK BY AND VERIFIED WITH: TERRI GREEN PHARMD @1918  05/23/21 EB Performed at [facility], [place] [address] [place], [place] [address]    Culture ESCHERICHIA COLI (A)  Final   Report Status 05/25/2021 FINAL  Final   Organism ID, Bacteria ESCHERICHIA COLI  Final      Susceptibility   Escherichia coli - MIC*    AMPICILLIN >=32 RESISTANT Resistant     CEFAZOLIN <=4 SENSITIVE Sensitive     CEFEPIME <=0.12 SENSITIVE Sensitive     CEFTAZIDIME <=1 SENSITIVE Sensitive     CEFTRIAXONE <=0.25 SENSITIVE Sensitive     CIPROFLOXACIN >=4 RESISTANT Resistant     GENTAMICIN <=1 SENSITIVE Sensitive     IMIPENEM <=0.25 SENSITIVE Sensitive     TRIMETH/SULFA <=20 SENSITIVE  Sensitive     AMPICILLIN/SULBACTAM 16 INTERMEDIATE Intermediate     PIP/TAZO <=4 SENSITIVE Sensitive     * ESCHERICHIA COLI  Blood Culture ID Panel (Reflexed)     Status: Abnormal   Collection Time: 05/23/21  1:04 AM  Result Value Ref Range Status   Enterococcus faecalis NOT DETECTED NOT DETECTED Final   Enterococcus Faecium NOT DETECTED NOT DETECTED Final   Listeria  monocytogenes NOT DETECTED NOT DETECTED Final   Staphylococcus species NOT DETECTED NOT DETECTED Final   Staphylococcus aureus (BCID) NOT DETECTED NOT DETECTED Final   Staphylococcus epidermidis NOT DETECTED NOT DETECTED Final   Staphylococcus lugdunensis NOT DETECTED NOT DETECTED Final   Streptococcus species NOT DETECTED NOT DETECTED Final   Streptococcus agalactiae NOT DETECTED NOT DETECTED Final   Streptococcus pneumoniae NOT DETECTED NOT DETECTED Final   Streptococcus pyogenes NOT DETECTED NOT DETECTED Final   A.calcoaceticus-baumannii NOT DETECTED NOT DETECTED Final   Bacteroides fragilis NOT DETECTED NOT DETECTED Final   Enterobacterales DETECTED (A) NOT DETECTED Final    Comment: Enterobacterales represent a large order of gram negative bacteria, not a single organism. CRITICAL RESULT CALLED TO, READ BACK BY AND VERIFIED WITH: TERRI GREEN PHARMD @1918  05/23/21 EB    Enterobacter cloacae complex NOT DETECTED NOT DETECTED Final   Escherichia coli DETECTED (A) NOT DETECTED Final    Comment: CRITICAL RESULT CALLED TO, READ BACK BY AND VERIFIED WITH: TERRI GREEN PHARMD @1918  05/23/21 EB    Klebsiella aerogenes NOT DETECTED NOT DETECTED Final   Klebsiella oxytoca NOT DETECTED NOT DETECTED Final   Klebsiella pneumoniae NOT DETECTED NOT DETECTED Final   Proteus species NOT DETECTED NOT DETECTED Final   Salmonella species NOT DETECTED NOT DETECTED Final   Serratia marcescens NOT DETECTED NOT DETECTED Final   Haemophilus influenzae NOT DETECTED NOT DETECTED Final   Neisseria meningitidis NOT DETECTED NOT DETECTED Final    Pseudomonas aeruginosa NOT DETECTED NOT DETECTED Final   Stenotrophomonas maltophilia NOT DETECTED NOT DETECTED Final   Candida albicans NOT DETECTED NOT DETECTED Final   Candida auris NOT DETECTED NOT DETECTED Final   Candida glabrata NOT DETECTED NOT DETECTED Final   Candida krusei NOT DETECTED NOT DETECTED Final   Candida parapsilosis NOT DETECTED NOT DETECTED Final   Candida tropicalis NOT DETECTED NOT DETECTED Final   Cryptococcus neoformans/gattii NOT DETECTED NOT DETECTED Final   CTX-M ESBL NOT DETECTED NOT DETECTED Final   Carbapenem resistance IMP NOT DETECTED NOT DETECTED Final   Carbapenem resistance KPC NOT DETECTED NOT DETECTED Final   Carbapenem resistance NDM NOT DETECTED NOT DETECTED Final   Carbapenem resist OXA 48 LIKE NOT DETECTED NOT DETECTED Final   Carbapenem resistance VIM NOT DETECTED NOT DETECTED Final    Comment: Performed at [facility], [address], [place], [place] [address]  Urine culture     Status: Abnormal   Collection Time: 05/23/21  2:37 AM   Specimen: In/Out Cath Urine  Result Value Ref Range Status   Specimen Description   Final    IN/OUT CATH URINE Performed at [facility] [address], [place], [place] [address]    Special Requests   Final    NONE Performed at [facility], [place] [address] [place], [place] [address]    Culture 20,000 COLONIES/mL ESCHERICHIA COLI (A)  Final   Report Status 05/25/2021 FINAL  Final   Organism ID, Bacteria ESCHERICHIA COLI (A)  Final      Susceptibility   Escherichia coli - MIC*    AMPICILLIN >=32 RESISTANT Resistant     CEFAZOLIN <=4 SENSITIVE Sensitive     CEFEPIME <=0.12 SENSITIVE Sensitive     CEFTRIAXONE <=0.25 SENSITIVE Sensitive     CIPROFLOXACIN >=4 RESISTANT Resistant     GENTAMICIN <=1 SENSITIVE Sensitive     IMIPENEM <=0.25 SENSITIVE Sensitive     NITROFURANTOIN <=16 SENSITIVE Sensitive     TRIMETH/SULFA <=20  SENSITIVE Sensitive      AMPICILLIN/SULBACTAM >=32 RESISTANT Resistant     PIP/TAZO <=4 SENSITIVE Sensitive     * 20,000 COLONIES/mL ESCHERICHIA COLI  C Difficile Quick Screen w PCR reflex     Status: None   Collection Time: 05/24/21 11:16 AM   Specimen: STOOL  Result Value Ref Range Status   C Diff antigen NEGATIVE NEGATIVE Final   C Diff toxin NEGATIVE NEGATIVE Final   C Diff interpretation No C. difficile detected.  Final    Comment: NEGATIVE Performed at [facility], [place] [address] [place], [place] [address]          Radiology Studies: No results found.      Scheduled Meds:  buPROPion  150 mg Oral Daily   nebivolol  10 mg Oral Daily   potassium chloride  40 mEq Oral Daily   Continuous Infusions:  cefTRIAXone (ROCEPHIN)  IV 2 g (05/24/21 2214)     LOS: 1 day   Time spent: 35 mins Greater than 50% of this time was spent in counseling, explanation of diagnosis, planning of further management, and coordination of care.   Voice Recognition Viviann Spare dictation system was used to create this note, attempts have been made to correct errors. Please contact the author with questions and/or clarifications.   Florencia Reasons, MD PhD FACP [facility]  Available via Epic secure chat 7am-7pm for nonurgent issues Please page for urgent issues To page the attending provider between 7A-7P or the covering provider during after hours 7P-7A, please log into the web site [email or web address] and access using universal [place] password for that web site. If you do not have the password, please call the hospital operator.    05/25/2021, 6:39 PM

## 2021-05-26 DIAGNOSIS — R6520 Severe sepsis without septic shock: Secondary | ICD-10-CM

## 2021-05-26 DIAGNOSIS — A4151 Sepsis due to Escherichia coli [E. coli]: Principal | ICD-10-CM

## 2021-05-26 LAB — GASTROINTESTINAL PANEL BY PCR, STOOL (REPLACES STOOL CULTURE)

## 2021-05-26 LAB — BASIC METABOLIC PANEL
Anion gap: 6 (ref 5–15)
BUN: 17 mg/dL (ref 8–23)
CO2: 20 mmol/L — ABNORMAL LOW (ref 22–32)
Calcium: 8 mg/dL — ABNORMAL LOW (ref 8.9–10.3)
Chloride: 108 mmol/L (ref 98–111)
Creatinine, Ser: 1.43 mg/dL — ABNORMAL HIGH (ref 0.44–1.00)
GFR, Estimated: 41 mL/min — ABNORMAL LOW (ref >60)
Glucose, Bld: 89 mg/dL (ref 70–99)
Potassium: 3 mmol/L — ABNORMAL LOW (ref 3.5–5.1)
Sodium: 134 mmol/L — ABNORMAL LOW (ref 135–145)

## 2021-05-26 LAB — CULTURE, BLOOD (ROUTINE X 2): Special Requests: ADEQUATE

## 2021-05-26 LAB — MAGNESIUM: Magnesium: 1.6 mg/dL — ABNORMAL LOW (ref 1.7–2.4)

## 2021-05-26 MED ORDER — POTASSIUM CHLORIDE CRYS ER 10 MEQ PO TBCR
40.0000 meq | EXTENDED_RELEASE_TABLET | ORAL | Status: AC
  Administered 2021-05-26 (×2): 40 meq via ORAL
  Filled 2021-05-26 (×2): qty 4

## 2021-05-26 MED ORDER — CEPHALEXIN 500 MG PO CAPS
500.0000 mg | ORAL_CAPSULE | Freq: Four times a day (QID) | ORAL | Status: DC
  Administered 2021-05-26 – 2021-05-27 (×4): 500 mg via ORAL
  Filled 2021-05-26 (×4): qty 1

## 2021-05-26 MED ORDER — HEPARIN SOD (PORK) LOCK FLUSH 100 UNIT/ML IV SOLN
500.0000 [IU] | INTRAVENOUS | Status: AC | PRN
  Administered 2021-05-26: 500 [IU]

## 2021-05-26 MED ORDER — MAGNESIUM SULFATE 2 GM/50ML IV SOLN
2.0000 g | Freq: Once | INTRAVENOUS | Status: AC
  Administered 2021-05-26: 2 g via INTRAVENOUS
  Filled 2021-05-26: qty 50

## 2021-05-26 NOTE — Plan of Care (Signed)

## 2021-05-26 NOTE — Progress Notes (Signed)
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  CT abdomen on presentation was concerning for pyelonephritis.  She was hypotensive on presentation, febrile, tachycardic.  Patient was admitted for the management of sepsis.  Started on broad-spectrum antibiotics.  Blood cultures showing E. coli  Assessment & Plan:   Active Problems:   Sepsis ([place])   UTI (urinary tract infection)  Gram-negative bacteremia/severe sepsis: Presented with fever, hypotension, tachycardia.  Started on broad-spectrum antibiotics on presentation.  Blood cultures showing E. coli.  Currently on Rocephin.  Will change antibiotics to oral.  Plan for 10 to 14 days course.  Diarrhea: C. difficile, GI pathogen panel negative.  Continue as needed Imodium.  She still has persistent diarrhea.    Hypokalemia/hypomagnesemia: Being supplemented and monitored.  Hyponatremia: Currently stable  AKI and CKD stage IIIb: Improved with rehydration, currently kidney function at baseline.  Takes Lasix at home.  Macrocytic anemia: Most likely secondary to malignancy/chemotherapy.  Currently hemoglobin in the range of 7-8.  No evidence of acute blood loss  Stage IV adenocarcinoma of the bladder: Diagnosed in November 2021.  Currently on FOLFOX regimen.  Underwent bilateral ureteral stent exchange/placement for hydronephrosis on 6/7.  Stents are in good place as per CT.  Hypertension: Presented with hypotension/sepsis.  On nebivolol, Norvasc, Lasix at home.  These medicines were initially held.  Hyperlipidemia: On statin  Anxiety/depression: On  welbutrin, Abilify            DVT prophylaxis:SCD Code Status: DNR Family Communication: Husband at bedside Status is: Inpatient  Remains inpatient appropriate because:Persistent severe electrolyte disturbances  Dispo: The patient is from: Home              Anticipated d/c is to: Home              Patient currently is not medically stable to d/c.   Difficult to place patient No     Consultants: None  Procedures:None  Antimicrobials:  Anti-infectives (From admission, onward)    Start     Dose/Rate Route Frequency Ordered Stop   05/23/21 2200  cefTRIAXone (ROCEPHIN) 2 g in sodium chloride 0.9 % 100 mL IVPB        2 g 200 mL/hr over 30 Minutes Intravenous Every 24 hours 05/23/21 2000     05/23/21 1515  ceFEPIme (MAXIPIME) 2 g in sodium chloride 0.9 % 100 mL IVPB  Status:  Discontinued        2 g 200 mL/hr over 30 Minutes Intravenous  Once 05/23/21 1513 05/23/21 1529   05/23/21 1300  cefTRIAXone (ROCEPHIN) 1 g in sodium chloride 0.9 % 100 mL IVPB  Status:  Discontinued        1 g 200 mL/hr over 30 Minutes Intravenous Every 24 hours 05/23/21 0422 05/23/21 1033   05/23/21 1200  ceFEPIme (MAXIPIME) 2 g in sodium chloride 0.9 % 100 mL IVPB  Status:  Discontinued        2 g 200 mL/hr over 30 Minutes Intravenous Every 12 hours 05/23/21 1033 05/23/21 1035   05/23/21 1200  ceFEPIme (MAXIPIME) 2 g in sodium chloride 0.9 % 100 mL IVPB  Status:  Discontinued  2 g 200 mL/hr over 30 Minutes Intravenous Daily 05/23/21 1035 05/23/21 2000   05/23/21 0115  ceFEPIme (MAXIPIME) 2 g in sodium chloride 0.9 % 100 mL IVPB        2 g 200 mL/hr over 30 Minutes Intravenous STAT 05/23/21 0100 05/23/21 0310   05/23/21 0100  aztreonam (AZACTAM) 2 g in sodium chloride 0.9 % 100 mL IVPB  Status:  Discontinued        2 g 200 mL/hr over 30 Minutes Intravenous  Once 05/23/21 0052 05/23/21 0059   05/23/21 0100  metroNIDAZOLE (FLAGYL) IVPB 500 mg        500 mg 100 mL/hr over 60 Minutes  Intravenous  Once 05/23/21 0052 05/23/21 0309   05/23/21 0100  vancomycin (VANCOCIN) IVPB 1000 mg/200 mL premix  Status:  Discontinued        1,000 mg 200 mL/hr over 60 Minutes Intravenous  Once 05/23/21 0052 05/23/21 0058   05/23/21 0100  vancomycin (VANCOREADY) IVPB 1750 mg/350 mL        1,750 mg 175 mL/hr over 120 Minutes Intravenous STAT 05/23/21 0058 05/23/21 0627       Subjective:  Patient seen and examined the bedside this morning.  Hemodynamically stable.  Still appears weak and complains of frequent diarrhea.  Denies any abdomen, nausea or vomiting.  AFebrile.   Objective: Vitals:   05/25/21 0607 05/25/21 1516 05/25/21 1959 05/26/21 0335  BP: (!) 147/84 135/81 (!) 149/98 (!) 149/87  Pulse: 91 82 71 78  Resp: 20 18 18 18   Temp: 98.8 F (37.1 C) 98.2 F (36.8 C) 98.5 F (36.9 C) 97.8 F (36.6 C)  TempSrc: Oral Oral Oral Oral  SpO2: 97% 100% 99% 98%  Weight:      Height:        Intake/Output Summary (Last 24 hours) at 05/26/2021 1049 Last data filed at 05/26/2021 3244 Gross per 24 hour  Intake 680.52 ml  Output 1 ml  Net 679.52 ml   Filed Weights   05/23/21 0022 05/23/21 1645  Weight: 83.9 kg 83.9 kg    Examination:  General exam: Appears calm and comfortable ,Not in distress, generalized weakness HEENT:PERRL,Oral mucosa moist, Ear/Nose normal on gross exam Respiratory system: Bilateral equal air entry, normal vesicular breath sounds, no wheezes or crackles  Cardiovascular system: S1 & S2 heard, RRR. No JVD, murmurs, rubs, gallops or clicks. No pedal edema.  Chemo-Port on the right chest Gastrointestinal system: Abdomen is nondistended, soft and nontender. No organomegaly or masses felt. Normal bowel sounds heard. Central nervous system: Alert and oriented. No focal neurological deficits. Extremities: No edema, no clubbing ,no cyanosis Skin: No rashes, lesions or ulcers,no icterus ,no pallor   Data Reviewed: I have personally reviewed following labs and  imaging studies  CBC: Recent Labs  Lab 05/22/21 0000 05/23/21 0100 05/24/21 0551 05/25/21 0506 05/25/21 2100  WBC 6.3 5.8 2.9* 3.7* 5.2  NEUTROABS 3,994 3.3  --  1.3* 1.3*  HGB 9.0* 9.4* 8.7* 8.5* 7.7*  HCT 27.2* 29.1* 26.9* 26.3* 23.9*  MCV 99.3 102.1* 103.1* 101.5* 100.4*  PLT 149 125* 102* 98* 010*   Basic Metabolic Panel: Recent Labs  Lab 05/23/21 0100 05/23/21 1045 05/23/21 1715 05/24/21 0551 05/25/21 0506 05/26/21 0400  NA 130*  --  133* 135 133* 134*  K 3.3*  --  3.4* 3.4* 3.2* 3.0*  CL 100  --  106 107 104 108  CO2 19*  --  15* 20* 17* 20*  GLUCOSE 103*  --  112* 108* 107* 89  BUN 27*  --  22 15 16 17   CREATININE 2.07*  --  1.74* 1.61* 1.77* 1.43*  CALCIUM 7.9*  --  7.8* 8.1* 8.2* 8.0*  MG  --  0.5* 1.5* 1.3* 2.0 1.6*  PHOS  --   --  2.7  --   --   --    GFR: Estimated Creatinine Clearance: 43.9 mL/min (A) (by C-G formula based on SCr of 1.43 mg/dL (H)). Liver Function Tests: Recent Labs  Lab 05/22/21 0000 05/23/21 0100 05/23/21 1715 05/24/21 0551 05/25/21 0506  AST 15 24  --  44* 37  ALT 9 17  --  22 23  ALKPHOS  --  74  --  72 72  BILITOT 0.3 0.7  --  0.4 0.5  PROT 6.5 6.9  --  6.8 7.2  ALBUMIN  --  3.4* 3.2* 3.1* 3.2*   Recent Labs  Lab 05/23/21 0100  LIPASE 19   No results for input(s): AMMONIA in the last 168 hours. Coagulation Profile: Recent Labs  Lab 05/23/21 0100 05/24/21 0551  INR 1.1 1.3*   Cardiac Enzymes: No results for input(s): CKTOTAL, CKMB, CKMBINDEX, TROPONINI in the last 168 hours. BNP (last 3 results) No results for input(s): PROBNP in the last 8760 hours. HbA1C: No results for input(s): HGBA1C in the last 72 hours. CBG: No results for input(s): GLUCAP in the last 168 hours. Lipid Profile: No results for input(s): CHOL, HDL, LDLCALC, TRIG, CHOLHDL, LDLDIRECT in the last 72 hours. Thyroid Function Tests: No results for input(s): TSH, T4TOTAL, FREET4, T3FREE, THYROIDAB in the last 72 hours. Anemia Panel: No  results for input(s): VITAMINB12, FOLATE, FERRITIN, TIBC, IRON, RETICCTPCT in the last 72 hours. Sepsis Labs: Recent Labs  Lab 05/23/21 0100 05/24/21 0551  PROCALCITON  --  12.12  LATICACIDVEN 1.9  --     Recent Results (from the past 240 hour(s))  Urine Culture     Status: Abnormal   Collection Time: 05/22/21 12:00 AM   Specimen: Urine  Result Value Ref Range Status   MICRO NUMBER: [id number]  Final   SPECIMEN QUALITY: Adequate  Final   Sample Source URINE  Final   STATUS: FINAL  Final   ISOLATE 1: Escherichia coli (A)  Final    Comment: Greater than 100,000 CFU/mL of Escherichia coli      Susceptibility   Escherichia coli - URINE CULTURE, REFLEX    AMOX/CLAVULANIC 8 Sensitive     AMPICILLIN >=32 Resistant     AMPICILLIN/SULBACTAM 16 Intermediate     CEFAZOLIN* <=4 Not Reportable      * For infections other than uncomplicated UTI caused by E. coli, K. pneumoniae or P. mirabilis: Cefazolin is resistant if MIC > or = 8 mcg/mL. (Distinguishing susceptible versus intermediate for isolates with MIC < or = 4 mcg/mL requires additional testing.) For uncomplicated UTI caused by E. coli, K. pneumoniae or P. mirabilis: Cefazolin is susceptible if MIC <32 mcg/mL and predicts susceptible to the oral agents cefaclor, cefdinir, cefpodoxime, cefprozil, cefuroxime, cephalexin and loracarbef.     CEFEPIME <=1 Sensitive     CEFTRIAXONE <=1 Sensitive     CIPROFLOXACIN >=4 Resistant     LEVOFLOXACIN >=8 Resistant     ERTAPENEM <=0.5 Sensitive     GENTAMICIN <=1 Sensitive     IMIPENEM <=0.25 Sensitive     NITROFURANTOIN <=16 Sensitive     PIP/TAZO <=4 Sensitive     TOBRAMYCIN <=1 Sensitive     TRIMETH/SULFA* <=20  Sensitive      * For infections other than uncomplicated UTI caused by E. coli, K. pneumoniae or P. mirabilis: Cefazolin is resistant if MIC > or = 8 mcg/mL. (Distinguishing susceptible versus intermediate for isolates with MIC < or = 4 mcg/mL requires additional  testing.) For uncomplicated UTI caused by E. coli, K. pneumoniae or P. mirabilis: Cefazolin is susceptible if MIC <32 mcg/mL and predicts susceptible to the oral agents cefaclor, cefdinir, cefpodoxime, cefprozil, cefuroxime, cephalexin and loracarbef. Legend: S = Susceptible  I = Intermediate R = Resistant  NS = Not susceptible * = Not tested  NR = Not reported **NN = See antimicrobic comments   Culture, blood (Routine x 2)     Status: Abnormal   Collection Time: 05/23/21  1:00 AM   Specimen: BLOOD  Result Value Ref Range Status   Specimen Description   Final    BLOOD LEFT ANTECUBITAL Performed at [facility] [address], [place], [place] [address]    Special Requests   Final    BOTTLES DRAWN AEROBIC AND ANAEROBIC Blood Culture adequate volume Performed at [facility] [address], [place], [place] [address]    Culture  Setup Time   Final    GRAM NEGATIVE RODS IN BOTH AEROBIC AND ANAEROBIC BOTTLES CRITICAL VALUE NOTED.  VALUE IS CONSISTENT WITH PREVIOUSLY REPORTED AND CALLED VALUE.    Culture (A)  Final    ESCHERICHIA COLI SUSCEPTIBILITIES PERFORMED ON PREVIOUS CULTURE WITHIN THE LAST 5 DAYS. Performed at [facility], [place] [address], [place], [place] [address]    Report Status 05/26/2021 FINAL  Final  Resp Panel by RT-PCR (Flu A&B, Covid) Nasopharyngeal Swab     Status: None   Collection Time: 05/23/21  1:00 AM   Specimen: Nasopharyngeal Swab; Nasopharyngeal(NP) swabs in vial transport medium  Result Value Ref Range Status   SARS Coronavirus 2 by RT PCR NEGATIVE NEGATIVE Final    Comment: (NOTE) SARS-CoV-2 target nucleic acids are NOT DETECTED.  The SARS-CoV-2 RNA is generally detectable in upper respiratory specimens during the acute phase of infection. The lowest concentration of SARS-CoV-2 viral copies this assay can detect is 138 copies/mL. A negative result does not preclude SARS-Cov-2 infection and should not  be used as the sole basis for treatment or other patient management decisions. A negative result may occur with  improper specimen collection/handling, submission of specimen other than nasopharyngeal swab, presence of viral mutation(s) within the areas targeted by this assay, and inadequate number of viral copies(<138 copies/mL). A negative result must be combined with clinical observations, patient history, and epidemiological information. The expected result is Negative.  Fact Sheet for Patients:  EntrepreneurPulse.com.au  Fact Sheet for Healthcare Providers:  IncredibleEmployment.be  This test is no t yet approved or cleared by the [place] FDA and  has been authorized for detection and/or diagnosis of SARS-CoV-2 by FDA under an Emergency Use Authorization (EUA). This EUA will remain  in effect (meaning this test can be used) for the duration of the COVID-19 declaration under Section 564(b)(1) of the Act, 21 U.S.C.section 360bbb-3(b)(1), unless the authorization is terminated  or revoked sooner.       Influenza A by PCR NEGATIVE NEGATIVE Final   Influenza B by PCR NEGATIVE NEGATIVE Final    Comment: (NOTE) The Xpert Xpress SARS-CoV-2/FLU/RSV plus assay is intended as an aid in the diagnosis of influenza from Nasopharyngeal swab specimens and should not be used as a sole basis for  treatment. Nasal washings and aspirates are unacceptable for Xpert Xpress SARS-CoV-2/FLU/RSV testing.  Fact Sheet for Patients: EntrepreneurPulse.com.au  Fact Sheet for Healthcare Providers: IncredibleEmployment.be  This test is not yet approved or cleared by the [place] FDA and has been authorized for detection and/or diagnosis of SARS-CoV-2 by FDA under an Emergency Use Authorization (EUA). This EUA will remain in effect (meaning this test can be used) for the duration of the COVID-19 declaration under Section  564(b)(1) of the Act, 21 U.S.C. section 360bbb-3(b)(1), unless the authorization is terminated or revoked.  Performed at [facility], [place] [address], [place], [place] [address]   Culture, blood (Routine x 2)     Status: Abnormal   Collection Time: 05/23/21  1:04 AM   Specimen: BLOOD LEFT HAND  Result Value Ref Range Status   Specimen Description   Final    BLOOD LEFT HAND Performed at [facility] [address] [place], [place] [address]    Special Requests   Final    BOTTLES DRAWN AEROBIC AND ANAEROBIC Blood Culture adequate volume Performed at [facility] [address], [place], [place] [address]    Culture  Setup Time   Final    GRAM NEGATIVE RODS IN BOTH AEROBIC AND ANAEROBIC BOTTLES CRITICAL RESULT CALLED TO, READ BACK BY AND VERIFIED WITH: TERRI GREEN PHARMD @1918  05/23/21 EB Performed at [facility], [place] [address] [place], [place] [address]    Culture ESCHERICHIA COLI (A)  Final   Report Status 05/25/2021 FINAL  Final   Organism ID, Bacteria ESCHERICHIA COLI  Final      Susceptibility   Escherichia coli - MIC*    AMPICILLIN >=32 RESISTANT Resistant     CEFAZOLIN <=4 SENSITIVE Sensitive     CEFEPIME <=0.12 SENSITIVE Sensitive     CEFTAZIDIME <=1 SENSITIVE Sensitive     CEFTRIAXONE <=0.25 SENSITIVE Sensitive     CIPROFLOXACIN >=4 RESISTANT Resistant     GENTAMICIN <=1 SENSITIVE Sensitive     IMIPENEM <=0.25 SENSITIVE Sensitive     TRIMETH/SULFA <=20 SENSITIVE Sensitive     AMPICILLIN/SULBACTAM 16 INTERMEDIATE Intermediate     PIP/TAZO <=4 SENSITIVE Sensitive     * ESCHERICHIA COLI  Blood Culture ID Panel (Reflexed)     Status: Abnormal   Collection Time: 05/23/21  1:04 AM  Result Value Ref Range Status   Enterococcus faecalis NOT DETECTED NOT DETECTED Final   Enterococcus Faecium NOT DETECTED NOT DETECTED Final   Listeria monocytogenes NOT DETECTED NOT DETECTED Final   Staphylococcus species  NOT DETECTED NOT DETECTED Final   Staphylococcus aureus (BCID) NOT DETECTED NOT DETECTED Final   Staphylococcus epidermidis NOT DETECTED NOT DETECTED Final   Staphylococcus lugdunensis NOT DETECTED NOT DETECTED Final   Streptococcus species NOT DETECTED NOT DETECTED Final   Streptococcus agalactiae NOT DETECTED NOT DETECTED Final   Streptococcus pneumoniae NOT DETECTED NOT DETECTED Final   Streptococcus pyogenes NOT DETECTED NOT DETECTED Final   A.calcoaceticus-baumannii NOT DETECTED NOT DETECTED Final   Bacteroides fragilis NOT DETECTED NOT DETECTED Final   Enterobacterales DETECTED (A) NOT DETECTED Final    Comment: Enterobacterales represent a large order of gram negative bacteria, not a single organism. CRITICAL RESULT CALLED TO, READ BACK BY AND VERIFIED WITH: TERRI GREEN PHARMD @1918  05/23/21 EB    Enterobacter cloacae complex NOT DETECTED NOT DETECTED Final   Escherichia coli DETECTED (A) NOT DETECTED Final    Comment: CRITICAL RESULT CALLED TO, READ BACK BY AND  VERIFIED WITH: TERRI GREEN PHARMD @1918  05/23/21 EB    Klebsiella aerogenes NOT DETECTED NOT DETECTED Final   Klebsiella oxytoca NOT DETECTED NOT DETECTED Final   Klebsiella pneumoniae NOT DETECTED NOT DETECTED Final   Proteus species NOT DETECTED NOT DETECTED Final   Salmonella species NOT DETECTED NOT DETECTED Final   Serratia marcescens NOT DETECTED NOT DETECTED Final   Haemophilus influenzae NOT DETECTED NOT DETECTED Final   Neisseria meningitidis NOT DETECTED NOT DETECTED Final   Pseudomonas aeruginosa NOT DETECTED NOT DETECTED Final   Stenotrophomonas maltophilia NOT DETECTED NOT DETECTED Final   Candida albicans NOT DETECTED NOT DETECTED Final   Candida auris NOT DETECTED NOT DETECTED Final   Candida glabrata NOT DETECTED NOT DETECTED Final   Candida krusei NOT DETECTED NOT DETECTED Final   Candida parapsilosis NOT DETECTED NOT DETECTED Final   Candida tropicalis NOT DETECTED NOT DETECTED Final   Cryptococcus  neoformans/gattii NOT DETECTED NOT DETECTED Final   CTX-M ESBL NOT DETECTED NOT DETECTED Final   Carbapenem resistance IMP NOT DETECTED NOT DETECTED Final   Carbapenem resistance KPC NOT DETECTED NOT DETECTED Final   Carbapenem resistance NDM NOT DETECTED NOT DETECTED Final   Carbapenem resist OXA 48 LIKE NOT DETECTED NOT DETECTED Final   Carbapenem resistance VIM NOT DETECTED NOT DETECTED Final    Comment: Performed at [facility], [address] [place], [place] [address]  Urine culture     Status: Abnormal   Collection Time: 05/23/21  2:37 AM   Specimen: In/Out Cath Urine  Result Value Ref Range Status   Specimen Description   Final    IN/OUT CATH URINE Performed at [facility] [address] [place], [place] [address]    Special Requests   Final    NONE Performed at [facility], [place] [address], [place], [place] [address]    Culture 20,000 COLONIES/mL ESCHERICHIA COLI (A)  Final   Report Status 05/25/2021 FINAL  Final   Organism ID, Bacteria ESCHERICHIA COLI (A)  Final      Susceptibility   Escherichia coli - MIC*    AMPICILLIN >=32 RESISTANT Resistant     CEFAZOLIN <=4 SENSITIVE Sensitive     CEFEPIME <=0.12 SENSITIVE Sensitive     CEFTRIAXONE <=0.25 SENSITIVE Sensitive     CIPROFLOXACIN >=4 RESISTANT Resistant     GENTAMICIN <=1 SENSITIVE Sensitive     IMIPENEM <=0.25 SENSITIVE Sensitive     NITROFURANTOIN <=16 SENSITIVE Sensitive     TRIMETH/SULFA <=20 SENSITIVE Sensitive     AMPICILLIN/SULBACTAM >=32 RESISTANT Resistant     PIP/TAZO <=4 SENSITIVE Sensitive     * 20,000 COLONIES/mL ESCHERICHIA COLI  C Difficile Quick Screen w PCR reflex     Status: None   Collection Time: 05/24/21 11:16 AM   Specimen: STOOL  Result Value Ref Range Status   C Diff antigen NEGATIVE NEGATIVE Final   C Diff toxin NEGATIVE NEGATIVE Final   C Diff interpretation No C. difficile detected.  Final    Comment: NEGATIVE Performed at [facility], [place] [address] [place], [place] [address]          Radiology Studies: No results found.      Scheduled Meds:  buPROPion  150 mg Oral Daily   Chlorhexidine Gluconate Cloth  6 each Topical Daily   nebivolol  10 mg Oral Daily   potassium chloride  40 mEq Oral Q4H   sodium chloride flush  10-40 mL Intracatheter Q12H  Continuous Infusions:  cefTRIAXone (ROCEPHIN)  IV Stopped (05/25/21 2312)     LOS: 2 days    Time spent: 25 mins.More than 50% of that time was spent in counseling and/or coordination of care.      Shelly Coss, MD [facility] P6/13/2022, 10:49 AM

## 2021-05-27 ENCOUNTER — Other Ambulatory Visit (HOSPITAL_COMMUNITY): Payer: Self-pay

## 2021-05-27 LAB — BASIC METABOLIC PANEL
Anion gap: 8 (ref 5–15)
BUN: 16 mg/dL (ref 8–23)
CO2: 19 mmol/L — ABNORMAL LOW (ref 22–32)
Calcium: 8.4 mg/dL — ABNORMAL LOW (ref 8.9–10.3)
Chloride: 107 mmol/L (ref 98–111)
Creatinine, Ser: 1.51 mg/dL — ABNORMAL HIGH (ref 0.44–1.00)
GFR, Estimated: 39 mL/min — ABNORMAL LOW (ref >60)
Glucose, Bld: 100 mg/dL — ABNORMAL HIGH (ref 70–99)
Potassium: 3.6 mmol/L (ref 3.5–5.1)
Sodium: 134 mmol/L — ABNORMAL LOW (ref 135–145)

## 2021-05-27 LAB — CBC WITH DIFFERENTIAL/PLATELET
Abs Immature Granulocytes: 0.81 10*3/uL — ABNORMAL HIGH (ref 0.00–0.07)
Basophils Absolute: 0.1 10*3/uL (ref 0.0–0.1)
Basophils Relative: 1 %
Eosinophils Absolute: 0 10*3/uL (ref 0.0–0.5)
Eosinophils Relative: 0 %
HCT: 24 % — ABNORMAL LOW (ref 36.0–46.0)
Hemoglobin: 7.9 g/dL — ABNORMAL LOW (ref 12.0–15.0)
Immature Granulocytes: 12 %
Lymphocytes Relative: 23 %
Lymphs Abs: 1.6 10*3/uL (ref 0.7–4.0)
MCH: 32.9 pg (ref 26.0–34.0)
MCHC: 32.9 g/dL (ref 30.0–36.0)
MCV: 100 fL (ref 80.0–100.0)
Monocytes Absolute: 2 10*3/uL — ABNORMAL HIGH (ref 0.1–1.0)
Monocytes Relative: 30 %
Neutro Abs: 2.3 10*3/uL (ref 1.7–7.7)
Neutrophils Relative %: 34 %
Platelets: 144 10*3/uL — ABNORMAL LOW (ref 150–400)
RBC: 2.4 MIL/uL — ABNORMAL LOW (ref 3.87–5.11)
RDW: 17.2 % — ABNORMAL HIGH (ref 11.5–15.5)
WBC: 6.8 10*3/uL (ref 4.0–10.5)
nRBC: 0.7 % — ABNORMAL HIGH (ref 0.0–0.2)

## 2021-05-27 MED ORDER — NEBIVOLOL HCL 20 MG PO TABS: 10.0000 mg | ORAL_TABLET | Freq: Every day | ORAL | 2 refills | Status: DC

## 2021-05-27 MED ORDER — CEPHALEXIN 500 MG PO CAPS
500.0000 mg | ORAL_CAPSULE | Freq: Four times a day (QID) | ORAL | 0 refills | Status: AC
  Filled 2021-05-27: qty 24, 6d supply, fill #0

## 2021-05-27 NOTE — Discharge Summary (Signed)
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  CT abdomen on presentation was concerning for pyelonephritis.  She was hypotensive on presentation, febrile, tachycardic.  Patient was admitted for the management of sepsis.  Started on broad-spectrum antibiotics.  Blood cultures showed pansensitive E. coli.  Antibiotics changed to Keflex.  Currently she is hemodynamically stable for discharge.  Following problems were addressed during her hospitalization:    Gram-negative bacteremia/severe sepsis: Presented with fever, hypotension, tachycardia.  Started on broad-spectrum antibiotics on presentation.  Blood cultures showing E. coli.  Started  on Rocephin, changed to Keflex for total of 10 days course.  Diarrhea: C. difficile, GI pathogen panel negative.  Continue as needed Imodium.  Diarrhea has improved today   Hypokalemia/hypomagnesemia: Supplemented   Hyponatremia: Currently stable   AKI and CKD stage IIIb: Improved with rehydration, currently kidney function at baseline.  Takes Lasix at home as needed   Macrocytic anemia: Most likely secondary to malignancy/chemotherapy.  Currently hemoglobin in the range of 7-8.  No evidence of acute blood loss   Stage IV adenocarcinoma of the bladder: Diagnosed in November 2021.  Currently on FOLFOX regimen.  Underwent bilateral ureteral stent exchange/placement for hydronephrosis on 6/7.  Stents are in good  place as per CT.   Hypertension: Presented with hypotension/sepsis.  On nebivolol, Norvasc, Lasix at home.  Dose of nebivolol reduced   Hyperlipidemia: On statin   Anxiety/depression: On welbutrin, Abilify       Discharge Diagnoses:  Active Problems:   Sepsis ([place])   UTI (urinary tract infection)    Discharge Instructions  Discharge Instructions     Diet - low sodium heart healthy   Complete by: As directed    Discharge instructions   Complete by: As directed    1)Please take prescribed medication as instructed. 2)Follow up with your PCP in a week.  Do a CBC, BMP tests during the follow-up 3)Follow up with your oncologist.   Increase activity slowly   Complete by: As directed       Allergies as of 05/27/2021       Reactions   Metformin And Related Other (See Comments)   Lactic acidosis    Penicillins Hives, Rash   Reports hives to penicillin and amoxicillin as an adult "a long time ago." Has tolerated cefdinir (09/2020) and cefepime (10/2020) before.        Medication List     STOP taking these medications    aspirin 81 MG EC tablet   furosemide 20 MG tablet Commonly known as: LASIX   Uro-MP 118 MG Caps       TAKE these medications    acetaminophen 500 MG tablet Commonly known as: TYLENOL Take 1,000 mg by mouth every 8 (eight) hours as needed for moderate pain.   albuterol 108 (90 Base) MCG/ACT inhaler Commonly known as: ProAir HFA INHALE 1 TO 2 PUFFS BY MOUTH INTO THE LUNGS EVERY 4 HOURS AS NEEDED FOR WHEEZING OR SHORTNESS OF BREATH What changed:  how much to take how to take this when to take this  reasons to take this   amLODipine 5 MG tablet Commonly known as: NORVASC TAKE 1 TABLET BY MOUTH ONCE A DAY (NEEDS OFFICE VISIT) What changed: how much to take   ARIPiprazole (sensor) 10 MG Tabs Take 10 mg by mouth daily. What changed:  when to take this additional instructions   ascorbic acid 500 MG tablet Commonly known as: VITAMIN  C Take 1,000 mg by mouth every evening.   AZO URINARY PAIN PO Take 1 tablet by mouth 3 (three) times daily as needed (bladder spasms).   buPROPion 300 MG 24 hr tablet Commonly known as: WELLBUTRIN XL TAKE 1 TABLET BY MOUTH EVERY DAY What changed: how much to take   cephALEXin 500 MG capsule Commonly known as: KEFLEX Take 1 capsule (500 mg total) by mouth every 6 (six) hours for 6 days.   ciprofloxacin 500 MG tablet Commonly known as: Cipro Take 1 tablet (500 mg total) by mouth 2 (two) times daily for 5 days.   clonazePAM 0.5 MG tablet Commonly known as: KLONOPIN TAKE 1 TABLET BY MOUTH AT BEDTIME What changed: how much to take   Co Q-10 200 MG Caps Take 200 mg by mouth every evening.   Fish Oil 1200 MG Caps Take 1,200 mg by mouth every evening.   gabapentin 300 MG capsule Commonly known as: NEURONTIN TAKE 1 CAPSULE BY MOUTH IN THE MORNING, 1 CAP IN THE AFTERNOON & 4 CAPS AT NIGHT What changed:  how much to take how to take this when to take this additional instructions   GLUCOSAMINE-CHONDROITIN PO Take 2 tablets by mouth every evening.   lidocaine-prilocaine cream Commonly known as: EMLA Apply 1 application topically as needed. What changed: reasons to take this   Myrbetriq 25 MG Tb24 tablet Generic drug: mirabegron ER TAKE 1 TABLET BY MOUTH ONCE A DAY What changed: how much to take   Nebivolol HCl 20 MG Tabs Take 0.5 tablets (10 mg total) by mouth daily. What changed: how much to take   omeprazole 20 MG capsule Commonly known as: PRILOSEC TAKE 1 CAPSULE BY MOUTH ONCE A DAY * NEEDS APPT FOR REFILLS What changed:  how much to take how to take this when to take this   ondansetron 4 MG tablet Commonly known as: Zofran Take 1 tablet (4 mg total) by mouth every 8 (eight) hours as needed for nausea or vomiting.   oxybutynin 5 MG tablet Commonly known as: DITROPAN TAKE 1 TABLET BY MOUTH EVERY 8 HOURS AS NEEDED What changed:  how much to take reasons to  take this   oxymetazoline 0.05 % nasal spray Commonly known as: AFRIN Place 1 spray into both nostrils 2 (two) times daily as needed for congestion.   simvastatin 20 MG tablet Commonly known as: ZOCOR TAKE 1 TABLET (20 MG TOTAL) BY MOUTH DAILY. What changed: when to take this   tamsulosin 0.4 MG Caps capsule Commonly known as: FLOMAX TAKE 1 CAPSULE BY MOUTH AT BEDTIME What changed: how much to take   thiamine 100 MG tablet Take 1 tablet (100 mg total) by mouth daily.   vitamin B-12 1000 MCG tablet Commonly known as: CYANOCOBALAMIN Take 2,000 mcg by mouth every evening.   Vitamin D 125 MCG (5000 UT) Caps Take 5,000 Units by mouth every evening.   Vitamin E 180 MG (400 UNIT) Caps Take 400 Units by mouth every evening.        Follow-up Information     [name], DO. Schedule an appointment as soon  as possible for a visit in 1 week(s).   Specialty: Osteopathic Medicine Contact information: [address] [place] [address] [phone]                Allergies  Allergen Reactions   Metformin And Related Other (See Comments)    Lactic acidosis    Penicillins Hives and Rash    Reports hives to penicillin and amoxicillin as an adult "a long time ago." Has tolerated cefdinir (09/2020) and cefepime (10/2020) before.    Consultations: None   Procedures/Studies: CT ABDOMEN PELVIS WO CONTRAST  Result Date: 05/23/2021 CLINICAL DATA:  Fever, abdominal pain, metastatic urothelial carcinoma undergoing chemotherapy,. EXAM: CT ABDOMEN AND PELVIS WITHOUT CONTRAST TECHNIQUE: Multidetector CT imaging of the abdomen and pelvis was performed following the standard protocol without IV contrast. COMPARISON:  03/27/2021 FINDINGS: Lower chest: There is subpleural reticulation and mild ground-glass pulmonary infiltrate again noted within the lung bases bilaterally most in keeping with organizing pneumonia and fibrosis related to remote COVID-19  pneumonia. These changes appear stable since prior examination. Stable 6 mm pulmonary nodule within the a subpleural right lower lobe, axial image # 9. No pleural effusion. The visualized heart and pericardium are unremarkable. Small hiatal hernia. Hepatobiliary: No focal liver abnormality is seen. No gallstones, gallbladder wall thickening, or biliary dilatation. Pancreas: Unremarkable Spleen: Unremarkable Adrenals/Urinary Tract: The adrenal glands are unremarkable. The kidneys are normal in size and position. Bilateral double-J ureteral stents are in expected position, unchanged from prior examination. Left-sided hydronephrosis has resolved. Fullness and soft tissue infiltration of the renal pelves bilaterally appears stable. There is increasing mild left perinephric stranding, possibly related to superimposed inflammation. The bladder is unremarkable. Stomach/Bowel: Stomach, small bowel, and large bowel are unremarkable. The appendix is normal. No free intraperitoneal gas or fluid. Vascular/Lymphatic: Mild aortoiliac atherosclerotic calcification. No aortic aneurysm. Pathologic adenopathy within the retroperitoneum has improved in the interval in keeping with response to therapy. Index left retroperitoneal lymph node measures 1.8 x 2.2 cm (previously 2.7 x 3.0 cm). No new pathologic adenopathy within the abdomen and pelvis. Reproductive: Uterus and bilateral adnexa are unremarkable. Other: Tiny fat containing umbilical hernia.  Rectum unremarkable. Musculoskeletal: No acute bone abnormality. No lytic or blastic bone lesions. Osseous structures are age-appropriate. IMPRESSION: Bilateral double-J ureteral stents are unchanged in position and left sided hydronephrosis has resolved. Stable fullness and soft tissue infiltration within the renal pelves bilaterally, not optimally assessed on this noncontrast examination but likely representing a combination of malignant disease and inflammatory reaction related to  chronic catheterization. However, superimposed perinephric stranding on the left may represent a superimposed inflammatory process such as pyelonephritis and correlation with urinalysis and urine culture may be helpful. Interval response to therapy with decreasing retroperitoneal lymph node. Index lymph node as outlined above. Stable bibasilar pulmonary changes most in keeping with remote COVID-19 pneumonia and residual organizing pneumonia/fibrosis. Stable 6 mm indeterminate right basilar noncalcified pulmonary nodule. Aortic Atherosclerosis (ICD10-I70.0). Electronically Signed   By: Fidela Salisbury MD   On: 05/23/2021 04:01   DG Chest Port 1 View  Result Date: 05/23/2021 CLINICAL DATA:  Questionable sepsis.  Fever. EXAM: PORTABLE CHEST 1 VIEW COMPARISON:  Chest x-ray 04/30/2021, CT chest 03/27/2021 FINDINGS: Right chest wall Port-A-Cath with tip overlying the expected region of the superior cavoatrial junction. The heart size and mediastinal contours are unchanged. Similar bilateral linear atelectasis/scarring. No focal consolidation. No pulmonary edema. No pleural effusion. No pneumothorax. No acute osseous abnormality. IMPRESSION: No active disease. Electronically Signed  By: Iven Finn M.D.   On: 05/23/2021 02:40   DG Chest Port 1 View  Result Date: 05/01/2021 CLINICAL DATA:  65 year old female with possible sepsis. Fever and fatigue. Urothelial carcinoma. EXAM: PORTABLE CHEST 1 VIEW COMPARISON:  Restaging CT Chest, Abdomen, and Pelvis 03/27/2021 and earlier. FINDINGS: Portable AP semi upright view at 0549 hours. Stable right chest power port. Lung volumes and mediastinal contours remain within normal limits. Streaky new bilateral lower lung peribronchial opacity. While patchy peripheral right lung opacity is stable and related to scarring last month. No superimposed pneumothorax, pulmonary edema or pleural effusion. No acute osseous abnormality identified. IMPRESSION: Increased streaky lower lung  peribronchial opacity could be atelectasis or acute infection, and is superimposed on peripheral lung scarring seen by CT last month. Electronically Signed   By: Genevie Ann M.D.   On: 05/01/2021 06:00      Subjective:  Patient seen and examined at the bedside this morning.  Hemodynamically stable for discharge today.   Discharge Exam: Vitals:   05/26/21 2038 05/27/21 0455  BP: 137/70 123/71  Pulse: 72 69  Resp: 18 18  Temp: 97.9 F (36.6 C) 97.9 F (36.6 C)  SpO2: 97% 99%   Vitals:   05/26/21 0335 05/26/21 1406 05/26/21 2038 05/27/21 0455  BP: (!) 149/87 (!) 153/85 137/70 123/71  Pulse: 78 64 72 69  Resp: 18 18 18 18   Temp: 97.8 F (36.6 C) 98.2 F (36.8 C) 97.9 F (36.6 C) 97.9 F (36.6 C)  TempSrc: Oral Oral    SpO2: 98% 100% 97% 99%  Weight:      Height:        General: Pt is alert, awake, not in acute distress Cardiovascular: RRR, S1/S2 +, no rubs, no gallops Respiratory: CTA bilaterally, no wheezing, no rhonchi Abdominal: Soft, NT, ND, bowel sounds + Extremities: no edema, no cyanosis    The results of significant diagnostics from this hospitalization (including imaging, microbiology, ancillary and laboratory) are listed below for reference.     Microbiology: Recent Results (from the past 240 hour(s))  Urine Culture     Status: Abnormal   Collection Time: 05/22/21 12:00 AM   Specimen: Urine  Result Value Ref Range Status   MICRO NUMBER: [id number]  Final   SPECIMEN QUALITY: Adequate  Final   Sample Source URINE  Final   STATUS: FINAL  Final   ISOLATE 1: Escherichia coli (A)  Final    Comment: Greater than 100,000 CFU/mL of Escherichia coli      Susceptibility   Escherichia coli - URINE CULTURE, REFLEX    AMOX/CLAVULANIC 8 Sensitive     AMPICILLIN >=32 Resistant     AMPICILLIN/SULBACTAM 16 Intermediate     CEFAZOLIN* <=4 Not Reportable      * For infections other than uncomplicated UTI caused by E. coli, K. pneumoniae or P. mirabilis: Cefazolin is  resistant if MIC > or = 8 mcg/mL. (Distinguishing susceptible versus intermediate for isolates with MIC < or = 4 mcg/mL requires additional testing.) For uncomplicated UTI caused by E. coli, K. pneumoniae or P. mirabilis: Cefazolin is susceptible if MIC <32 mcg/mL and predicts susceptible to the oral agents cefaclor, cefdinir, cefpodoxime, cefprozil, cefuroxime, cephalexin and loracarbef.     CEFEPIME <=1 Sensitive     CEFTRIAXONE <=1 Sensitive     CIPROFLOXACIN >=4 Resistant     LEVOFLOXACIN >=8 Resistant     ERTAPENEM <=0.5 Sensitive     GENTAMICIN <=1 Sensitive     IMIPENEM <=0.25 Sensitive  NITROFURANTOIN <=16 Sensitive     PIP/TAZO <=4 Sensitive     TOBRAMYCIN <=1 Sensitive     TRIMETH/SULFA* <=20 Sensitive      * For infections other than uncomplicated UTI caused by E. coli, K. pneumoniae or P. mirabilis: Cefazolin is resistant if MIC > or = 8 mcg/mL. (Distinguishing susceptible versus intermediate for isolates with MIC < or = 4 mcg/mL requires additional testing.) For uncomplicated UTI caused by E. coli, K. pneumoniae or P. mirabilis: Cefazolin is susceptible if MIC <32 mcg/mL and predicts susceptible to the oral agents cefaclor, cefdinir, cefpodoxime, cefprozil, cefuroxime, cephalexin and loracarbef. Legend: S = Susceptible  I = Intermediate R = Resistant  NS = Not susceptible * = Not tested  NR = Not reported **NN = See antimicrobic comments   Culture, blood (Routine x 2)     Status: Abnormal   Collection Time: 05/23/21  1:00 AM   Specimen: BLOOD  Result Value Ref Range Status   Specimen Description   Final    BLOOD LEFT ANTECUBITAL Performed at [facility] [address], [place], [place] [address]    Special Requests   Final    BOTTLES DRAWN AEROBIC AND ANAEROBIC Blood Culture adequate volume Performed at [facility] [address], [place], [place] [address]    Culture  Setup Time   Final    GRAM NEGATIVE  RODS IN BOTH AEROBIC AND ANAEROBIC BOTTLES CRITICAL VALUE NOTED.  VALUE IS CONSISTENT WITH PREVIOUSLY REPORTED AND CALLED VALUE.    Culture (A)  Final    ESCHERICHIA COLI SUSCEPTIBILITIES PERFORMED ON PREVIOUS CULTURE WITHIN THE LAST 5 DAYS. Performed at [facility], [place] [address] [place], [place] [address]    Report Status 05/26/2021 FINAL  Final  Resp Panel by RT-PCR (Flu A&B, Covid) Nasopharyngeal Swab     Status: None   Collection Time: 05/23/21  1:00 AM   Specimen: Nasopharyngeal Swab; Nasopharyngeal(NP) swabs in vial transport medium  Result Value Ref Range Status   SARS Coronavirus 2 by RT PCR NEGATIVE NEGATIVE Final    Comment: (NOTE) SARS-CoV-2 target nucleic acids are NOT DETECTED.  The SARS-CoV-2 RNA is generally detectable in upper respiratory specimens during the acute phase of infection. The lowest concentration of SARS-CoV-2 viral copies this assay can detect is 138 copies/mL. A negative result does not preclude SARS-Cov-2 infection and should not be used as the sole basis for treatment or other patient management decisions. A negative result may occur with  improper specimen collection/handling, submission of specimen other than nasopharyngeal swab, presence of viral mutation(s) within the areas targeted by this assay, and inadequate number of viral copies(<138 copies/mL). A negative result must be combined with clinical observations, patient history, and epidemiological information. The expected result is Negative.  Fact Sheet for Patients:  EntrepreneurPulse.com.au  Fact Sheet for Healthcare Providers:  IncredibleEmployment.be  This test is no t yet approved or cleared by the [place] FDA and  has been authorized for detection and/or diagnosis of SARS-CoV-2 by FDA under an Emergency Use Authorization (EUA). This EUA will remain  in effect (meaning this test can be used) for the duration of the COVID-19  declaration under Section 564(b)(1) of the Act, 21 U.S.C.section 360bbb-3(b)(1), unless the authorization is terminated  or revoked sooner.       Influenza A by PCR NEGATIVE NEGATIVE Final   Influenza B by PCR NEGATIVE NEGATIVE Final    Comment: (NOTE) The Xpert Xpress SARS-CoV-2/FLU/RSV plus assay is  intended as an aid in the diagnosis of influenza from Nasopharyngeal swab specimens and should not be used as a sole basis for treatment. Nasal washings and aspirates are unacceptable for Xpert Xpress SARS-CoV-2/FLU/RSV testing.  Fact Sheet for Patients: EntrepreneurPulse.com.au  Fact Sheet for Healthcare Providers: IncredibleEmployment.be  This test is not yet approved or cleared by the [place] FDA and has been authorized for detection and/or diagnosis of SARS-CoV-2 by FDA under an Emergency Use Authorization (EUA). This EUA will remain in effect (meaning this test can be used) for the duration of the COVID-19 declaration under Section 564(b)(1) of the Act, 21 U.S.C. section 360bbb-3(b)(1), unless the authorization is terminated or revoked.  Performed at [facility], [place] [address], [place], [place] [address]   Culture, blood (Routine x 2)     Status: Abnormal   Collection Time: 05/23/21  1:04 AM   Specimen: BLOOD LEFT HAND  Result Value Ref Range Status   Specimen Description   Final    BLOOD LEFT HAND Performed at [facility] [address] [place], [place] [address]    Special Requests   Final    BOTTLES DRAWN AEROBIC AND ANAEROBIC Blood Culture adequate volume Performed at [facility] [address], [place], [place] [address]    Culture  Setup Time   Final    GRAM NEGATIVE RODS IN BOTH AEROBIC AND ANAEROBIC BOTTLES CRITICAL RESULT CALLED TO, READ BACK BY AND VERIFIED WITH: TERRI GREEN PHARMD @1918  05/23/21 EB Performed at [facility], [place] [address]  [place], [place] [address]    Culture ESCHERICHIA COLI (A)  Final   Report Status 05/25/2021 FINAL  Final   Organism ID, Bacteria ESCHERICHIA COLI  Final      Susceptibility   Escherichia coli - MIC*    AMPICILLIN >=32 RESISTANT Resistant     CEFAZOLIN <=4 SENSITIVE Sensitive     CEFEPIME <=0.12 SENSITIVE Sensitive     CEFTAZIDIME <=1 SENSITIVE Sensitive     CEFTRIAXONE <=0.25 SENSITIVE Sensitive     CIPROFLOXACIN >=4 RESISTANT Resistant     GENTAMICIN <=1 SENSITIVE Sensitive     IMIPENEM <=0.25 SENSITIVE Sensitive     TRIMETH/SULFA <=20 SENSITIVE Sensitive     AMPICILLIN/SULBACTAM 16 INTERMEDIATE Intermediate     PIP/TAZO <=4 SENSITIVE Sensitive     * ESCHERICHIA COLI  Blood Culture ID Panel (Reflexed)     Status: Abnormal   Collection Time: 05/23/21  1:04 AM  Result Value Ref Range Status   Enterococcus faecalis NOT DETECTED NOT DETECTED Final   Enterococcus Faecium NOT DETECTED NOT DETECTED Final   Listeria monocytogenes NOT DETECTED NOT DETECTED Final   Staphylococcus species NOT DETECTED NOT DETECTED Final   Staphylococcus aureus (BCID) NOT DETECTED NOT DETECTED Final   Staphylococcus epidermidis NOT DETECTED NOT DETECTED Final   Staphylococcus lugdunensis NOT DETECTED NOT DETECTED Final   Streptococcus species NOT DETECTED NOT DETECTED Final   Streptococcus agalactiae NOT DETECTED NOT DETECTED Final   Streptococcus pneumoniae NOT DETECTED NOT DETECTED Final   Streptococcus pyogenes NOT DETECTED NOT DETECTED Final   A.calcoaceticus-baumannii NOT DETECTED NOT DETECTED Final   Bacteroides fragilis NOT DETECTED NOT DETECTED Final   Enterobacterales DETECTED (A) NOT DETECTED Final    Comment: Enterobacterales represent a large order of gram negative bacteria, not a single organism. CRITICAL RESULT CALLED TO, READ BACK BY AND VERIFIED WITH: TERRI GREEN PHARMD @1918  05/23/21 EB    Enterobacter cloacae complex NOT DETECTED NOT DETECTED  Final   Escherichia coli DETECTED (A) NOT  DETECTED Final    Comment: CRITICAL RESULT CALLED TO, READ BACK BY AND VERIFIED WITH: TERRI GREEN PHARMD @1918  05/23/21 EB    Klebsiella aerogenes NOT DETECTED NOT DETECTED Final   Klebsiella oxytoca NOT DETECTED NOT DETECTED Final   Klebsiella pneumoniae NOT DETECTED NOT DETECTED Final   Proteus species NOT DETECTED NOT DETECTED Final   Salmonella species NOT DETECTED NOT DETECTED Final   Serratia marcescens NOT DETECTED NOT DETECTED Final   Haemophilus influenzae NOT DETECTED NOT DETECTED Final   Neisseria meningitidis NOT DETECTED NOT DETECTED Final   Pseudomonas aeruginosa NOT DETECTED NOT DETECTED Final   Stenotrophomonas maltophilia NOT DETECTED NOT DETECTED Final   Candida albicans NOT DETECTED NOT DETECTED Final   Candida auris NOT DETECTED NOT DETECTED Final   Candida glabrata NOT DETECTED NOT DETECTED Final   Candida krusei NOT DETECTED NOT DETECTED Final   Candida parapsilosis NOT DETECTED NOT DETECTED Final   Candida tropicalis NOT DETECTED NOT DETECTED Final   Cryptococcus neoformans/gattii NOT DETECTED NOT DETECTED Final   CTX-M ESBL NOT DETECTED NOT DETECTED Final   Carbapenem resistance IMP NOT DETECTED NOT DETECTED Final   Carbapenem resistance KPC NOT DETECTED NOT DETECTED Final   Carbapenem resistance NDM NOT DETECTED NOT DETECTED Final   Carbapenem resist OXA 48 LIKE NOT DETECTED NOT DETECTED Final   Carbapenem resistance VIM NOT DETECTED NOT DETECTED Final    Comment: Performed at [facility], [address] [place], [place] [address]  Urine culture     Status: Abnormal   Collection Time: 05/23/21  2:37 AM   Specimen: In/Out Cath Urine  Result Value Ref Range Status   Specimen Description   Final    IN/OUT CATH URINE Performed at [facility] [address], [place], [place] [address]    Special Requests   Final    NONE Performed at [facility], [place] [address], [place], [place] [address]    Culture 20,000  COLONIES/mL ESCHERICHIA COLI (A)  Final   Report Status 05/25/2021 FINAL  Final   Organism ID, Bacteria ESCHERICHIA COLI (A)  Final      Susceptibility   Escherichia coli - MIC*    AMPICILLIN >=32 RESISTANT Resistant     CEFAZOLIN <=4 SENSITIVE Sensitive     CEFEPIME <=0.12 SENSITIVE Sensitive     CEFTRIAXONE <=0.25 SENSITIVE Sensitive     CIPROFLOXACIN >=4 RESISTANT Resistant     GENTAMICIN <=1 SENSITIVE Sensitive     IMIPENEM <=0.25 SENSITIVE Sensitive     NITROFURANTOIN <=16 SENSITIVE Sensitive     TRIMETH/SULFA <=20 SENSITIVE Sensitive     AMPICILLIN/SULBACTAM >=32 RESISTANT Resistant     PIP/TAZO <=4 SENSITIVE Sensitive     * 20,000 COLONIES/mL ESCHERICHIA COLI  C Difficile Quick Screen w PCR reflex     Status: None   Collection Time: 05/24/21 11:16 AM   Specimen: STOOL  Result Value Ref Range Status   C Diff antigen NEGATIVE NEGATIVE Final   C Diff toxin NEGATIVE NEGATIVE Final   C Diff interpretation No C. difficile detected.  Final    Comment: NEGATIVE Performed at [facility], [place] [address], [place], [place] [address]   Gastrointestinal Panel by PCR , Stool     Status: None   Collection Time: 05/25/21  2:35 PM   Specimen: Stool  Result Value Ref Range Status   Campylobacter species NOT DETECTED NOT DETECTED Final  Plesimonas shigelloides NOT DETECTED NOT DETECTED Final   Salmonella species NOT DETECTED NOT DETECTED Final   Yersinia enterocolitica NOT DETECTED NOT DETECTED Final   Vibrio species NOT DETECTED NOT DETECTED Final   Vibrio cholerae NOT DETECTED NOT DETECTED Final   Enteroaggregative E coli (EAEC) NOT DETECTED NOT DETECTED Final   Enteropathogenic E coli (EPEC) NOT DETECTED NOT DETECTED Final   Enterotoxigenic E coli (ETEC) NOT DETECTED NOT DETECTED Final   Shiga like toxin producing E coli (STEC) NOT DETECTED NOT DETECTED Final   Shigella/Enteroinvasive E coli (EIEC) NOT DETECTED NOT DETECTED Final   Cryptosporidium NOT DETECTED NOT  DETECTED Final   Cyclospora cayetanensis NOT DETECTED NOT DETECTED Final   Entamoeba histolytica NOT DETECTED NOT DETECTED Final   Giardia lamblia NOT DETECTED NOT DETECTED Final   Adenovirus F40/41 NOT DETECTED NOT DETECTED Final   Astrovirus NOT DETECTED NOT DETECTED Final   Norovirus GI/GII NOT DETECTED NOT DETECTED Final   Rotavirus A NOT DETECTED NOT DETECTED Final   Sapovirus (I, II, IV, and V) NOT DETECTED NOT DETECTED Final    Comment: Performed at [facility], [address] [place], [place] [address]     Labs: BNP (last 3 results) Recent Labs    09/12/20 1215  BNP 89   Basic Metabolic Panel: Recent Labs  Lab 05/23/21 1045 05/23/21 1715 05/24/21 0551 05/25/21 0506 05/26/21 0400 05/27/21 0522  NA  --  133* 135 133* 134* 134*  K  --  3.4* 3.4* 3.2* 3.0* 3.6  CL  --  106 107 104 108 107  CO2  --  15* 20* 17* 20* 19*  GLUCOSE  --  112* 108* 107* 89 100*  BUN  --  22 15 16 17 16   CREATININE  --  1.74* 1.61* 1.77* 1.43* 1.51*  CALCIUM  --  7.8* 8.1* 8.2* 8.0* 8.4*  MG 0.5* 1.5* 1.3* 2.0 1.6*  --   PHOS  --  2.7  --   --   --   --    Liver Function Tests: Recent Labs  Lab 05/22/21 0000 05/23/21 0100 05/23/21 1715 05/24/21 0551 05/25/21 0506  AST 15 24  --  44* 37  ALT 9 17  --  22 23  ALKPHOS  --  74  --  72 72  BILITOT 0.3 0.7  --  0.4 0.5  PROT 6.5 6.9  --  6.8 7.2  ALBUMIN  --  3.4* 3.2* 3.1* 3.2*   Recent Labs  Lab 05/23/21 0100  LIPASE 19   No results for input(s): AMMONIA in the last 168 hours. CBC: Recent Labs  Lab 05/22/21 0000 05/23/21 0100 05/24/21 0551 05/25/21 0506 05/25/21 2100 05/27/21 0522  WBC 6.3 5.8 2.9* 3.7* 5.2 6.8  NEUTROABS 3,994 3.3  --  1.3* 1.3* 2.3  HGB 9.0* 9.4* 8.7* 8.5* 7.7* 7.9*  HCT 27.2* 29.1* 26.9* 26.3* 23.9* 24.0*  MCV 99.3 102.1* 103.1* 101.5* 100.4* 100.0  PLT 149 125* 102* 98* 114* 144*   Cardiac Enzymes: No results for input(s): CKTOTAL, CKMB, CKMBINDEX, TROPONINI in the last 168  hours. BNP: Invalid input(s): POCBNP CBG: No results for input(s): GLUCAP in the last 168 hours. D-Dimer No results for input(s): DDIMER in the last 72 hours. Hgb A1c No results for input(s): HGBA1C in the last 72 hours. Lipid Profile No results for input(s): CHOL, HDL, LDLCALC, TRIG, CHOLHDL, LDLDIRECT in the last 72 hours. Thyroid function studies No results for input(s): TSH, T4TOTAL, T3FREE, THYROIDAB in the  last 72 hours.  Invalid input(s): FREET3 Anemia work up No results for input(s): VITAMINB12, FOLATE, FERRITIN, TIBC, IRON, RETICCTPCT in the last 72 hours. Urinalysis    Component Value Date/Time   COLORURINE AMBER (A) 05/23/2021 0237   APPEARANCEUR HAZY (A) 05/23/2021 0237   LABSPEC 1.009 05/23/2021 0237   PHURINE 6.0 05/23/2021 0237   GLUCOSEU NEGATIVE 05/23/2021 0237   HGBUR SMALL (A) 05/23/2021 0237   BILIRUBINUR NEGATIVE 05/23/2021 0237   BILIRUBINUR negative 12/21/2020 1230   KETONESUR NEGATIVE 05/23/2021 0237   PROTEINUR 100 (A) 05/23/2021 0237   UROBILINOGEN 0.2 12/21/2020 1230   NITRITE POSITIVE (A) 05/23/2021 0237   LEUKOCYTESUR LARGE (A) 05/23/2021 0237   Sepsis Labs Invalid input(s): PROCALCITONIN,  WBC,  LACTICIDVEN Microbiology Recent Results (from the past 240 hour(s))  Urine Culture     Status: Abnormal   Collection Time: 05/22/21 12:00 AM   Specimen: Urine  Result Value Ref Range Status   MICRO NUMBER: [id number]  Final   SPECIMEN QUALITY: Adequate  Final   Sample Source URINE  Final   STATUS: FINAL  Final   ISOLATE 1: Escherichia coli (A)  Final    Comment: Greater than 100,000 CFU/mL of Escherichia coli      Susceptibility   Escherichia coli - URINE CULTURE, REFLEX    AMOX/CLAVULANIC 8 Sensitive     AMPICILLIN >=32 Resistant     AMPICILLIN/SULBACTAM 16 Intermediate     CEFAZOLIN* <=4 Not Reportable      * For infections other than uncomplicated UTI caused by E. coli, K. pneumoniae or P. mirabilis: Cefazolin is resistant if MIC > or = 8  mcg/mL. (Distinguishing susceptible versus intermediate for isolates with MIC < or = 4 mcg/mL requires additional testing.) For uncomplicated UTI caused by E. coli, K. pneumoniae or P. mirabilis: Cefazolin is susceptible if MIC <32 mcg/mL and predicts susceptible to the oral agents cefaclor, cefdinir, cefpodoxime, cefprozil, cefuroxime, cephalexin and loracarbef.     CEFEPIME <=1 Sensitive     CEFTRIAXONE <=1 Sensitive     CIPROFLOXACIN >=4 Resistant     LEVOFLOXACIN >=8 Resistant     ERTAPENEM <=0.5 Sensitive     GENTAMICIN <=1 Sensitive     IMIPENEM <=0.25 Sensitive     NITROFURANTOIN <=16 Sensitive     PIP/TAZO <=4 Sensitive     TOBRAMYCIN <=1 Sensitive     TRIMETH/SULFA* <=20 Sensitive      * For infections other than uncomplicated UTI caused by E. coli, K. pneumoniae or P. mirabilis: Cefazolin is resistant if MIC > or = 8 mcg/mL. (Distinguishing susceptible versus intermediate for isolates with MIC < or = 4 mcg/mL requires additional testing.) For uncomplicated UTI caused by E. coli, K. pneumoniae or P. mirabilis: Cefazolin is susceptible if MIC <32 mcg/mL and predicts susceptible to the oral agents cefaclor, cefdinir, cefpodoxime, cefprozil, cefuroxime, cephalexin and loracarbef. Legend: S = Susceptible  I = Intermediate R = Resistant  NS = Not susceptible * = Not tested  NR = Not reported **NN = See antimicrobic comments   Culture, blood (Routine x 2)     Status: Abnormal   Collection Time: 05/23/21  1:00 AM   Specimen: BLOOD  Result Value Ref Range Status   Specimen Description   Final    BLOOD LEFT ANTECUBITAL Performed at [facility] [place] [address], [place], [place] [address]    Special Requests   Final    BOTTLES DRAWN AEROBIC AND ANAEROBIC Blood Culture adequate volume Performed at [facility]  [place] [address], [place], [place] [address]    Culture  Setup Time   Final    GRAM NEGATIVE RODS IN BOTH AEROBIC AND  ANAEROBIC BOTTLES CRITICAL VALUE NOTED.  VALUE IS CONSISTENT WITH PREVIOUSLY REPORTED AND CALLED VALUE.    Culture (A)  Final    ESCHERICHIA COLI SUSCEPTIBILITIES PERFORMED ON PREVIOUS CULTURE WITHIN THE LAST 5 DAYS. Performed at [facility], [place] [address], [place], [place] [address]    Report Status 05/26/2021 FINAL  Final  Resp Panel by RT-PCR (Flu A&B, Covid) Nasopharyngeal Swab     Status: None   Collection Time: 05/23/21  1:00 AM   Specimen: Nasopharyngeal Swab; Nasopharyngeal(NP) swabs in vial transport medium  Result Value Ref Range Status   SARS Coronavirus 2 by RT PCR NEGATIVE NEGATIVE Final    Comment: (NOTE) SARS-CoV-2 target nucleic acids are NOT DETECTED.  The SARS-CoV-2 RNA is generally detectable in upper respiratory specimens during the acute phase of infection. The lowest concentration of SARS-CoV-2 viral copies this assay can detect is 138 copies/mL. A negative result does not preclude SARS-Cov-2 infection and should not be used as the sole basis for treatment or other patient management decisions. A negative result may occur with  improper specimen collection/handling, submission of specimen other than nasopharyngeal swab, presence of viral mutation(s) within the areas targeted by this assay, and inadequate number of viral copies(<138 copies/mL). A negative result must be combined with clinical observations, patient history, and epidemiological information. The expected result is Negative.  Fact Sheet for Patients:  EntrepreneurPulse.com.au  Fact Sheet for Healthcare Providers:  IncredibleEmployment.be  This test is no t yet approved or cleared by the [place] FDA and  has been authorized for detection and/or diagnosis of SARS-CoV-2 by FDA under an Emergency Use Authorization (EUA). This EUA will remain  in effect (meaning this test can be used) for the duration of the COVID-19 declaration under Section  564(b)(1) of the Act, 21 U.S.C.section 360bbb-3(b)(1), unless the authorization is terminated  or revoked sooner.       Influenza A by PCR NEGATIVE NEGATIVE Final   Influenza B by PCR NEGATIVE NEGATIVE Final    Comment: (NOTE) The Xpert Xpress SARS-CoV-2/FLU/RSV plus assay is intended as an aid in the diagnosis of influenza from Nasopharyngeal swab specimens and should not be used as a sole basis for treatment. Nasal washings and aspirates are unacceptable for Xpert Xpress SARS-CoV-2/FLU/RSV testing.  Fact Sheet for Patients: EntrepreneurPulse.com.au  Fact Sheet for Healthcare Providers: IncredibleEmployment.be  This test is not yet approved or cleared by the [place] FDA and has been authorized for detection and/or diagnosis of SARS-CoV-2 by FDA under an Emergency Use Authorization (EUA). This EUA will remain in effect (meaning this test can be used) for the duration of the COVID-19 declaration under Section 564(b)(1) of the Act, 21 U.S.C. section 360bbb-3(b)(1), unless the authorization is terminated or revoked.  Performed at [facility], [place] [address], [place], [place] [address]   Culture, blood (Routine x 2)     Status: Abnormal   Collection Time: 05/23/21  1:04 AM   Specimen: BLOOD LEFT HAND  Result Value Ref Range Status   Specimen Description   Final    BLOOD LEFT HAND Performed at [place] [facility] [address], [place], [place] [address]    Special Requests   Final    BOTTLES DRAWN AEROBIC AND ANAEROBIC Blood Culture adequate volume Performed at [facility],  [place] [address], [place], [place] [address]    Culture  Setup Time   Final    GRAM NEGATIVE RODS IN BOTH AEROBIC AND ANAEROBIC BOTTLES CRITICAL RESULT CALLED TO, READ BACK BY AND VERIFIED WITH: TERRI GREEN PHARMD @1918  05/23/21 EB Performed at [facility], [place] [address], [place], [place] [address]     Culture ESCHERICHIA COLI (A)  Final   Report Status 05/25/2021 FINAL  Final   Organism ID, Bacteria ESCHERICHIA COLI  Final      Susceptibility   Escherichia coli - MIC*    AMPICILLIN >=32 RESISTANT Resistant     CEFAZOLIN <=4 SENSITIVE Sensitive     CEFEPIME <=0.12 SENSITIVE Sensitive     CEFTAZIDIME <=1 SENSITIVE Sensitive     CEFTRIAXONE <=0.25 SENSITIVE Sensitive     CIPROFLOXACIN >=4 RESISTANT Resistant     GENTAMICIN <=1 SENSITIVE Sensitive     IMIPENEM <=0.25 SENSITIVE Sensitive     TRIMETH/SULFA <=20 SENSITIVE Sensitive     AMPICILLIN/SULBACTAM 16 INTERMEDIATE Intermediate     PIP/TAZO <=4 SENSITIVE Sensitive     * ESCHERICHIA COLI  Blood Culture ID Panel (Reflexed)     Status: Abnormal   Collection Time: 05/23/21  1:04 AM  Result Value Ref Range Status   Enterococcus faecalis NOT DETECTED NOT DETECTED Final   Enterococcus Faecium NOT DETECTED NOT DETECTED Final   Listeria monocytogenes NOT DETECTED NOT DETECTED Final   Staphylococcus species NOT DETECTED NOT DETECTED Final   Staphylococcus aureus (BCID) NOT DETECTED NOT DETECTED Final   Staphylococcus epidermidis NOT DETECTED NOT DETECTED Final   Staphylococcus lugdunensis NOT DETECTED NOT DETECTED Final   Streptococcus species NOT DETECTED NOT DETECTED Final   Streptococcus agalactiae NOT DETECTED NOT DETECTED Final   Streptococcus pneumoniae NOT DETECTED NOT DETECTED Final   Streptococcus pyogenes NOT DETECTED NOT DETECTED Final   A.calcoaceticus-baumannii NOT DETECTED NOT DETECTED Final   Bacteroides fragilis NOT DETECTED NOT DETECTED Final   Enterobacterales DETECTED (A) NOT DETECTED Final    Comment: Enterobacterales represent a large order of gram negative bacteria, not a single organism. CRITICAL RESULT CALLED TO, READ BACK BY AND VERIFIED WITH: TERRI GREEN PHARMD @1918  05/23/21 EB    Enterobacter cloacae complex NOT DETECTED NOT DETECTED Final   Escherichia coli DETECTED (A) NOT DETECTED Final    Comment:  CRITICAL RESULT CALLED TO, READ BACK BY AND VERIFIED WITH: TERRI GREEN PHARMD @1918  05/23/21 EB    Klebsiella aerogenes NOT DETECTED NOT DETECTED Final   Klebsiella oxytoca NOT DETECTED NOT DETECTED Final   Klebsiella pneumoniae NOT DETECTED NOT DETECTED Final   Proteus species NOT DETECTED NOT DETECTED Final   Salmonella species NOT DETECTED NOT DETECTED Final   Serratia marcescens NOT DETECTED NOT DETECTED Final   Haemophilus influenzae NOT DETECTED NOT DETECTED Final   Neisseria meningitidis NOT DETECTED NOT DETECTED Final   Pseudomonas aeruginosa NOT DETECTED NOT DETECTED Final   Stenotrophomonas maltophilia NOT DETECTED NOT DETECTED Final   Candida albicans NOT DETECTED NOT DETECTED Final   Candida auris NOT DETECTED NOT DETECTED Final   Candida glabrata NOT DETECTED NOT DETECTED Final   Candida krusei NOT DETECTED NOT DETECTED Final   Candida parapsilosis NOT DETECTED NOT DETECTED Final   Candida tropicalis NOT DETECTED NOT DETECTED Final   Cryptococcus neoformans/gattii NOT DETECTED NOT DETECTED Final   CTX-M ESBL NOT DETECTED NOT DETECTED Final   Carbapenem resistance IMP NOT DETECTED NOT DETECTED Final   Carbapenem resistance KPC NOT DETECTED NOT DETECTED Final  Carbapenem resistance NDM NOT DETECTED NOT DETECTED Final   Carbapenem resist OXA 48 LIKE NOT DETECTED NOT DETECTED Final   Carbapenem resistance VIM NOT DETECTED NOT DETECTED Final    Comment: Performed at [facility], [place] [address] [place], [place] [address]  Urine culture     Status: Abnormal   Collection Time: 05/23/21  2:37 AM   Specimen: In/Out Cath Urine  Result Value Ref Range Status   Specimen Description   Final    IN/OUT CATH URINE Performed at [facility] [address] [place], [place] [address]    Special Requests   Final    NONE Performed at [facility], [place] [address], [place], [place] [address]    Culture 20,000 COLONIES/mL ESCHERICHIA COLI (A)   Final   Report Status 05/25/2021 FINAL  Final   Organism ID, Bacteria ESCHERICHIA COLI (A)  Final      Susceptibility   Escherichia coli - MIC*    AMPICILLIN >=32 RESISTANT Resistant     CEFAZOLIN <=4 SENSITIVE Sensitive     CEFEPIME <=0.12 SENSITIVE Sensitive     CEFTRIAXONE <=0.25 SENSITIVE Sensitive     CIPROFLOXACIN >=4 RESISTANT Resistant     GENTAMICIN <=1 SENSITIVE Sensitive     IMIPENEM <=0.25 SENSITIVE Sensitive     NITROFURANTOIN <=16 SENSITIVE Sensitive     TRIMETH/SULFA <=20 SENSITIVE Sensitive     AMPICILLIN/SULBACTAM >=32 RESISTANT Resistant     PIP/TAZO <=4 SENSITIVE Sensitive     * 20,000 COLONIES/mL ESCHERICHIA COLI  C Difficile Quick Screen w PCR reflex     Status: None   Collection Time: 05/24/21 11:16 AM   Specimen: STOOL  Result Value Ref Range Status   C Diff antigen NEGATIVE NEGATIVE Final   C Diff toxin NEGATIVE NEGATIVE Final   C Diff interpretation No C. difficile detected.  Final    Comment: NEGATIVE Performed at [facility], [place] [address] [place], [place] [address]   Gastrointestinal Panel by PCR , Stool     Status: None   Collection Time: 05/25/21  2:35 PM   Specimen: Stool  Result Value Ref Range Status   Campylobacter species NOT DETECTED NOT DETECTED Final   Plesimonas shigelloides NOT DETECTED NOT DETECTED Final   Salmonella species NOT DETECTED NOT DETECTED Final   Yersinia enterocolitica NOT DETECTED NOT DETECTED Final   Vibrio species NOT DETECTED NOT DETECTED Final   Vibrio cholerae NOT DETECTED NOT DETECTED Final   Enteroaggregative E coli (EAEC) NOT DETECTED NOT DETECTED Final   Enteropathogenic E coli (EPEC) NOT DETECTED NOT DETECTED Final   Enterotoxigenic E coli (ETEC) NOT DETECTED NOT DETECTED Final   Shiga like toxin producing E coli (STEC) NOT DETECTED NOT DETECTED Final   Shigella/Enteroinvasive E coli (EIEC) NOT DETECTED NOT DETECTED Final   Cryptosporidium NOT DETECTED NOT DETECTED Final   Cyclospora  cayetanensis NOT DETECTED NOT DETECTED Final   Entamoeba histolytica NOT DETECTED NOT DETECTED Final   Giardia lamblia NOT DETECTED NOT DETECTED Final   Adenovirus F40/41 NOT DETECTED NOT DETECTED Final   Astrovirus NOT DETECTED NOT DETECTED Final   Norovirus GI/GII NOT DETECTED NOT DETECTED Final   Rotavirus A NOT DETECTED NOT DETECTED Final   Sapovirus (I, II, IV, and V) NOT DETECTED NOT DETECTED Final    Comment: Performed at [facility], [address] [place], [place] [address]    Please note: You were cared for by a hospitalist during your hospital  stay. Once you are discharged, your primary care physician will handle any further medical issues. Please note that NO REFILLS for any discharge medications will be authorized once you are discharged, as it is imperative that you return to your primary care physician (or establish a relationship with a primary care physician if you do not have one) for your post hospital discharge needs so that they can reassess your need for medications and monitor your lab values.    Time coordinating discharge: 40 minutes  SIGNED:   Shelly Coss, MD  [facility] 05/27/2021, 10:41 AM Pager [phone]  If 7PM-7AM, please contact night-coverage [email or web address] Password [email or web address]

## 2021-05-28 ENCOUNTER — Inpatient Hospital Stay: Payer: No Typology Code available for payment source

## 2021-05-28 ENCOUNTER — Inpatient Hospital Stay (HOSPITAL_BASED_OUTPATIENT_CLINIC_OR_DEPARTMENT_OTHER): Payer: No Typology Code available for payment source | Admitting: Oncology

## 2021-05-28 ENCOUNTER — Other Ambulatory Visit: Payer: Self-pay

## 2021-05-28 VITALS — BP 104/70 | HR 72 | Temp 97.9°F | Resp 17 | Ht 66.0 in | Wt 177.3 lb

## 2021-05-28 DIAGNOSIS — C801 Malignant (primary) neoplasm, unspecified: Secondary | ICD-10-CM

## 2021-05-28 DIAGNOSIS — Z95828 Presence of other vascular implants and grafts: Secondary | ICD-10-CM

## 2021-05-28 DIAGNOSIS — C669 Malignant neoplasm of unspecified ureter: Secondary | ICD-10-CM

## 2021-05-28 DIAGNOSIS — D630 Anemia in neoplastic disease: Secondary | ICD-10-CM | POA: Diagnosis not present

## 2021-05-28 DIAGNOSIS — Z5111 Encounter for antineoplastic chemotherapy: Secondary | ICD-10-CM | POA: Diagnosis not present

## 2021-05-28 DIAGNOSIS — D709 Neutropenia, unspecified: Secondary | ICD-10-CM | POA: Diagnosis not present

## 2021-05-28 DIAGNOSIS — R5383 Other fatigue: Secondary | ICD-10-CM | POA: Diagnosis not present

## 2021-05-28 DIAGNOSIS — R0609 Other forms of dyspnea: Secondary | ICD-10-CM | POA: Diagnosis not present

## 2021-05-28 LAB — CBC WITH DIFFERENTIAL (CANCER CENTER ONLY)
Abs Immature Granulocytes: 2.17 10*3/uL — ABNORMAL HIGH (ref 0.00–0.07)
Basophils Absolute: 0 10*3/uL (ref 0.0–0.1)
Basophils Relative: 0 %
Eosinophils Absolute: 0 10*3/uL (ref 0.0–0.5)
Eosinophils Relative: 0 %
HCT: 24.4 % — ABNORMAL LOW (ref 36.0–46.0)
Hemoglobin: 8 g/dL — ABNORMAL LOW (ref 12.0–15.0)
Immature Granulocytes: 18 %
Lymphocytes Relative: 15 %
Lymphs Abs: 1.8 10*3/uL (ref 0.7–4.0)
MCH: 32.7 pg (ref 26.0–34.0)
MCHC: 32.8 g/dL (ref 30.0–36.0)
MCV: 99.6 fL (ref 80.0–100.0)
Monocytes Absolute: 2 10*3/uL — ABNORMAL HIGH (ref 0.1–1.0)
Monocytes Relative: 17 %
Neutro Abs: 6.1 10*3/uL (ref 1.7–7.7)
Neutrophils Relative %: 50 %
Platelet Count: 204 10*3/uL (ref 150–400)
RBC: 2.45 MIL/uL — ABNORMAL LOW (ref 3.87–5.11)
RDW: 17.4 % — ABNORMAL HIGH (ref 11.5–15.5)
WBC Count: 12.1 10*3/uL — ABNORMAL HIGH (ref 4.0–10.5)
nRBC: 0.7 % — ABNORMAL HIGH (ref 0.0–0.2)

## 2021-05-28 LAB — CMP (CANCER CENTER ONLY)
ALT: 15 U/L (ref 0–44)
AST: 18 U/L (ref 15–41)
Albumin: 2.8 g/dL — ABNORMAL LOW (ref 3.5–5.0)
Alkaline Phosphatase: 78 U/L (ref 38–126)
Anion gap: 11 (ref 5–15)
BUN: 19 mg/dL (ref 8–23)
CO2: 16 mmol/L — ABNORMAL LOW (ref 22–32)
Calcium: 8.5 mg/dL — ABNORMAL LOW (ref 8.9–10.3)
Chloride: 107 mmol/L (ref 98–111)
Creatinine: 1.33 mg/dL — ABNORMAL HIGH (ref 0.44–1.00)
GFR, Estimated: 45 mL/min — ABNORMAL LOW (ref >60)
Glucose, Bld: 106 mg/dL — ABNORMAL HIGH (ref 70–99)
Potassium: 3.7 mmol/L (ref 3.5–5.1)
Sodium: 134 mmol/L — ABNORMAL LOW (ref 135–145)
Total Bilirubin: 0.3 mg/dL (ref 0.3–1.2)
Total Protein: 7.2 g/dL (ref 6.5–8.1)

## 2021-05-28 LAB — SAMPLE TO BLOOD BANK

## 2021-05-28 MED ORDER — SODIUM CHLORIDE 0.9% FLUSH
10.0000 mL | Freq: Once | INTRAVENOUS | Status: AC
  Administered 2021-05-28: 10 mL
  Filled 2021-05-28: qty 10

## 2021-05-28 MED ORDER — SODIUM CHLORIDE 0.9 % IV SOLN
10.0000 mg | Freq: Once | INTRAVENOUS | Status: AC
  Administered 2021-05-28: 10 mg via INTRAVENOUS
  Filled 2021-05-28: qty 10

## 2021-05-28 MED ORDER — DEXTROSE 5 % IV SOLN
Freq: Once | INTRAVENOUS | Status: DC
  Filled 2021-05-28: qty 250

## 2021-05-28 MED ORDER — DEXTROSE 5 % IV SOLN
Freq: Once | INTRAVENOUS | Status: AC
  Filled 2021-05-28: qty 250

## 2021-05-28 MED ORDER — FLUOROURACIL CHEMO INJECTION 2.5 GM/50ML
400.0000 mg/m2 | Freq: Once | INTRAVENOUS | Status: AC
  Administered 2021-05-28: 800 mg via INTRAVENOUS
  Filled 2021-05-28: qty 16

## 2021-05-28 MED ORDER — ACETAMINOPHEN 325 MG PO TABS
ORAL_TABLET | ORAL | Status: AC
  Filled 2021-05-28: qty 2

## 2021-05-28 MED ORDER — PALONOSETRON HCL INJECTION 0.25 MG/5ML
INTRAVENOUS | Status: AC
  Filled 2021-05-28: qty 5

## 2021-05-28 MED ORDER — OXALIPLATIN CHEMO INJECTION 100 MG/20ML
85.0000 mg/m2 | Freq: Once | INTRAVENOUS | Status: AC
  Administered 2021-05-28: 170 mg via INTRAVENOUS
  Filled 2021-05-28: qty 34

## 2021-05-28 MED ORDER — SODIUM CHLORIDE 0.9 % IV SOLN
2490.0000 mg/m2 | INTRAVENOUS | Status: DC
  Administered 2021-05-28: 5000 mg via INTRAVENOUS
  Filled 2021-05-28: qty 100

## 2021-05-28 MED ORDER — PALONOSETRON HCL INJECTION 0.25 MG/5ML
0.2500 mg | Freq: Once | INTRAVENOUS | Status: AC
  Administered 2021-05-28: 0.25 mg via INTRAVENOUS

## 2021-05-28 MED ORDER — ACETAMINOPHEN 325 MG PO TABS
650.0000 mg | ORAL_TABLET | Freq: Four times a day (QID) | ORAL | Status: DC | PRN
  Administered 2021-05-28: 650 mg via ORAL

## 2021-05-28 MED ORDER — LEUCOVORIN CALCIUM INJECTION 350 MG
400.0000 mg/m2 | Freq: Once | INTRAVENOUS | Status: AC
  Administered 2021-05-28: 804 mg via INTRAVENOUS
  Filled 2021-05-28: qty 40.2

## 2021-05-28 NOTE — Progress Notes (Signed)
Denies anyHematology and Oncology Follow Up Visit  Mary Stark [id number] 1956-07-04 65 y.o. 05/28/2021 11:42 AM [name], DOAlexander, [place], DO   [name]: 65 year old woman with bladder cancer diagnosed in November 2021.  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 PD-L1 score of 1.   Prior Therapy:  She is status post retroperitoneal lymph node biopsy completed on 10/24/2020 which showed metastatic carcinoma.  She is status post repeat cystoscopy and bilateral ureteral stent exchange as well as resection of a bladder tumor on January 21, 2021.  A final pathology showed an adenocarcinoma.  Chemotherapy utilizing carboplatin and gemcitabine started on November 22, 2020.  She completed 6 cycles of therapy on March 06, 2021.  Current therapy: FOLFOX chemotherapy started on Apr 15, 2021.  He is here for cycle 4 of therapy.   Interim History: Mary Stark returns today for a follow-up visit.  Since last visit, she completed the third cycle of chemotherapy but did require hospitalization between 9 June 10 and May 27, 2021.  She presented with fever, hypotension and bacteremia.  Blood culture showed E. coli and was started on Rocephin which was changed to Keflex for total of 10-day course.   Since her discharge, she is reporting feeling slight improvement but still have quite a bit of fatigue.  She denies any fevers, chills or sweats.  The hematochezia or melena.  She still has loose bowel habits at times.     Medications: Unchanged on review. Current Outpatient Medications  Medication Sig Dispense Refill   acetaminophen (TYLENOL) 500 MG tablet Take 1,000 mg by mouth every 8 (eight) hours as needed for moderate pain.      albuterol (PROAIR HFA) 108 (90 Base) MCG/ACT inhaler INHALE 1 TO 2 PUFFS BY MOUTH INTO THE LUNGS EVERY 4 HOURS AS NEEDED FOR WHEEZING OR SHORTNESS OF BREATH 8.5 g 1   amLODipine (NORVASC) 5 MG tablet  TAKE 1 TABLET BY MOUTH ONCE A DAY (NEEDS OFFICE VISIT) 90 tablet 0   ARIPiprazole 10 MG TABS Take 10 mg by mouth daily. 90 tablet 1   ascorbic acid (VITAMIN C) 500 MG tablet Take 1,000 mg by mouth every evening.      buPROPion (WELLBUTRIN XL) 300 MG 24 hr tablet TAKE 1 TABLET BY MOUTH EVERY DAY 30 tablet 3   cephALEXin (KEFLEX) 500 MG capsule Take 1 capsule (500 mg total) by mouth every 6 (six) hours for 6 days. 24 capsule 0   Cholecalciferol (VITAMIN D) 125 MCG (5000 UT) CAPS Take 5,000 Units by mouth every evening.      clonazePAM (KLONOPIN) 0.5 MG tablet TAKE 1 TABLET BY MOUTH AT BEDTIME 30 tablet 0   Coenzyme Q10 (CO Q-10) 200 MG CAPS Take 200 mg by mouth every evening.      gabapentin (NEURONTIN) 300 MG capsule TAKE 1 CAPSULE BY MOUTH IN THE MORNING, 1 CAP IN THE AFTERNOON & 4 CAPS AT NIGHT 180 capsule 3   GLUCOSAMINE-CHONDROITIN PO Take 2 tablets by mouth every evening.      lidocaine-prilocaine (EMLA) cream Apply 1 application topically as needed. 30 g 0   mirabegron ER (MYRBETRIQ) 25 MG TB24 tablet TAKE 1 TABLET BY MOUTH ONCE A DAY 30 tablet 3   Nebivolol HCl 20 MG TABS Take 0.5 tablets (10 mg total) by mouth daily. 90 tablet 2   Omega-3 Fatty Acids (FISH OIL) 1200 MG CAPS Take 1,200 mg by mouth every evening.      omeprazole (PRILOSEC) 20  MG capsule TAKE 1 CAPSULE BY MOUTH ONCE A DAY * NEEDS APPT FOR REFILLS 90 capsule 0   ondansetron (ZOFRAN) 4 MG tablet Take 1 tablet (4 mg total) by mouth every 8 (eight) hours as needed for nausea or vomiting. 40 tablet 3   oxybutynin (DITROPAN) 5 MG tablet TAKE 1 TABLET BY MOUTH EVERY 8 HOURS AS NEEDED 90 tablet 3   oxymetazoline (AFRIN) 0.05 % nasal spray Place 1 spray into both nostrils 2 (two) times daily as needed for congestion.     Phenazopyridine HCl (AZO URINARY PAIN PO) Take 1 tablet by mouth 3 (three) times daily as needed (bladder spasms).     simvastatin (ZOCOR) 20 MG tablet TAKE 1 TABLET (20 MG TOTAL) BY MOUTH DAILY. 90 tablet 0    tamsulosin (FLOMAX) 0.4 MG CAPS capsule TAKE 1 CAPSULE BY MOUTH AT BEDTIME 30 capsule 3   thiamine 100 MG tablet Take 1 tablet (100 mg total) by mouth daily. 30 tablet 0   vitamin B-12 (CYANOCOBALAMIN) 1000 MCG tablet Take 2,000 mcg by mouth every evening.      Vitamin E 180 MG (400 UNIT) CAPS Take 400 Units by mouth every evening.      No current facility-administered medications for this visit.     Allergies:  Allergies  Allergen Reactions   Metformin And Related Other (See Comments)    Lactic acidosis    Penicillins Hives and Rash    Reports hives to penicillin and amoxicillin as an adult "a long time ago." Has tolerated cefdinir (09/2020) and cefepime (10/2020) before.      Physical Exam:      Blood pressure 104/70, pulse 72, temperature 97.9 F (36.6 C), temperature source Tympanic, resp. rate 17, height 5' 6" (1.676 m), weight 177 lb 4.8 oz (80.4 kg), SpO2 100 %.     ECOG: 1    General appearance: Comfortable appearing without any discomfort Head: Normocephalic without any trauma Oropharynx: Mucous membranes are moist and pink without any thrush or ulcers. Eyes: Pupils are equal and round reactive to light. Lymph nodes: No cervical, supraclavicular, inguinal or axillary lymphadenopathy.   Heart:regular rate and rhythm.  S1 and S2 without leg edema. Lung: Clear without any rhonchi or wheezes.  No dullness to percussion. Abdomin: Soft, nontender, nondistended with good bowel sounds.  No hepatosplenomegaly. Musculoskeletal: No joint deformity or effusion.  Full range of motion noted. Neurological: No deficits noted on motor, sensory and deep tendon reflex exam. Skin: No petechial rash or dryness.  Appeared moist.             Lab Results: Lab Results  Component Value Date   WBC 6.8 05/27/2021   HGB 7.9 (L) 05/27/2021   HCT 24.0 (L) 05/27/2021   MCV 100.0 05/27/2021   PLT 144 (L) 05/27/2021     Chemistry      Component Value Date/Time   NA 134  (L) 05/27/2021 0522   NA 142 04/26/2018 1333   K 3.6 05/27/2021 0522   CL 107 05/27/2021 0522   CO2 19 (L) 05/27/2021 0522   BUN 16 05/27/2021 0522   BUN 22 04/26/2018 1333   CREATININE 1.51 (H) 05/27/2021 0522   CREATININE 1.84 (H) 05/22/2021 0000   GLU 87 02/07/2013 0000      Component Value Date/Time   CALCIUM 8.4 (L) 05/27/2021 0522   ALKPHOS 72 05/25/2021 0506   AST 37 05/25/2021 0506   AST 17 05/13/2021 0911   ALT 23 05/25/2021 0506   ALT  11 05/13/2021 0911   BILITOT 0.5 05/25/2021 0506   BILITOT 0.2 (L) 05/13/2021 0911     IMPRESSION: Bilateral double-J ureteral stents are unchanged in position and left sided hydronephrosis has resolved. Stable fullness and soft tissue infiltration within the renal pelves bilaterally, not optimally assessed on this noncontrast examination but likely representing a combination of malignant disease and inflammatory reaction related to chronic catheterization. However, superimposed perinephric stranding on the left may represent a superimposed inflammatory process such as pyelonephritis and correlation with urinalysis and urine culture may be helpful.   Interval response to therapy with decreasing retroperitoneal lymph node. Index lymph node as outlined above.   Stable bibasilar pulmonary changes most in keeping with remote COVID-19 pneumonia and residual organizing pneumonia/fibrosis.   Stable 6 mm indeterminate right basilar noncalcified pulmonary nodule.   Aortic Atherosclerosis (ICD10-I70.0).  Impression and Plan:    65 year old woman with:   1.    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  She has received 3 cycles of chemotherapy with reasonable response at this time.  CT scan obtained on May 23, 2021 showed response to therapy with reduction in her pelvic adenopathy indicating clinical response.  Risks and benefits of proceeding with treatment were discussed at this time.  Complications  associated with this treatment include nausea, vomiting, myelosuppression or neuropathy were reiterated.  She is agreeable to proceed at this time.  Plan is to repeat imaging studies after cycle 8 of therapy.   2.  IV access: Port-A-Cath currently in use without any issues.   3.  Antiemetics: Zofran is available to her.  Nausea is reasonably controlled.   4.  Renal function surveillance: Her kidney function remained at baseline after hydration and recent hospitalization.   5.  Goals of care: Her disease is incurable although aggressive measures are warranted given her recent pulm status and reasonable response.   6.  Anemia: Related to malignancy and chemotherapy.  She has received packed red cell transfusion in the past.   7.  Follow-up: In 2 weeks for the next cycle of therapy.   30  minutes were spent on this visit.  The time was dedicated to reviewing laboratory data, disease status update, reviewing imaging studies and future plan of care discussion.   Zola Button, MD 6/15/202211:42 AM

## 2021-05-29 ENCOUNTER — Other Ambulatory Visit (HOSPITAL_COMMUNITY): Payer: Self-pay

## 2021-05-29 ENCOUNTER — Other Ambulatory Visit: Payer: Self-pay | Admitting: Family Medicine

## 2021-05-29 ENCOUNTER — Other Ambulatory Visit: Payer: Self-pay | Admitting: *Deleted

## 2021-05-29 MED ORDER — CLONAZEPAM 0.5 MG PO TABS
ORAL_TABLET | Freq: Every day | ORAL | 0 refills | Status: DC
  Filled 2021-05-29: qty 30, 30d supply, fill #0

## 2021-05-29 NOTE — Patient Outreach (Signed)
[facility]) Care Management  05/29/2021  LEIANI ENRIGHT 1956/04/24 [id number]  Transition of care call/case closure   Referral received:05/26/21 Initial outreach:05/29/21 Insurance: [employer]    Subjective: Initial successful telephone call to patient's preferred number in order to complete transition of care assessment; 2 HIPAA identifiers verified. Explained purpose of call and completed transition of care assessment.  Trenace states that she is getting along, she denies having fever, reports urinating without difficulty. She discussed feeling weak, dizzy at times shortness of breath just when get up and moving around,94% oxygen saturation rest ,reports having some orthostatic blood pressure stating some reading in upper 90's usually runs 120's. Reinforced changing positions slowly. She reports feeling better when her hemoglobin  at 9 range . She reports Chemo infusion on yesterday and has return appointment to stop chemo on 6/17.  She reports tolerating diet but not much of an appetite, she is drinking at least 2 premier protein between meals, she denies blood in urine denies fever . She discussed being a Marine scientist.  Spouse/children are assisting with her  recovery.   Reviewed accessing the following [place] Benefits : Debbrah reports being enrolled in [facility] program for disease education support on chronic health issues .She  reports being on long term disability.  She uses [facility] outpatient pharmacy at [place].      Objective:  Per electronic medical record, Brigett Estell was hospitalized at [facility] 6/11-6/14/22 for Sepsis UTI with hematuria, diarrhea,Past Medical history includes: Stage IV adenocarcinoma of bladder , currently on FOLOX regimen Ureteral stent exchange and placment for hydronephrosis on 05/20/21, Hypertension,  Anxiety.  She was discharged to home on 05/29/21 without the need for home health services or DME.   Assessment:   Patient voices good understanding of all discharge instructions.  See transition of care flowsheet for assessment details.   Plan:  Reviewed hospital discharge diagnosis of sepsis UTI  and discharge treatment plan using hospital discharge instructions, assessing medication adherence, reviewing problems requiring provider notification, and discussing the importance of follow up with surgeon, primary care provider and/or specialists as directed. Reinforced notifying MD   Reviewed [place] healthy lifestyle program information to receive discounted premium for  2023   Step 1: Get  your annual physical  Step 2: Complete your health assessment  Step 3:Identify your current health status and complete the corresponding action step between December 14, 2020 and August 14, 2021.    Using [place] website, verified that patient is an active participate in [place]'s Active Health Management chronic disease management program.    No ongoing care management needs identified so will close case to [facility] services and route successful outreach letter with [facility] pamphlet and 24 Hour Nurse Line Magnet to [facility] clinical pool to be mailed to patient's home address. Patient has care coordinator contact number is new care needs arise she denies needs at this time .    Joylene Draft, RN, BSN  [place] Management Coordinator  [phone]- Mobile [phone]- Toll Free Main Office

## 2021-05-30 ENCOUNTER — Other Ambulatory Visit: Payer: Self-pay

## 2021-05-30 ENCOUNTER — Inpatient Hospital Stay: Payer: No Typology Code available for payment source

## 2021-05-30 ENCOUNTER — Other Ambulatory Visit (HOSPITAL_COMMUNITY): Payer: Self-pay

## 2021-05-30 ENCOUNTER — Telehealth: Payer: Self-pay | Admitting: Oncology

## 2021-05-30 ENCOUNTER — Encounter: Payer: Self-pay | Admitting: Oncology

## 2021-05-30 VITALS — BP 121/72 | HR 60 | Temp 97.7°F | Resp 20

## 2021-05-30 DIAGNOSIS — C669 Malignant neoplasm of unspecified ureter: Secondary | ICD-10-CM

## 2021-05-30 DIAGNOSIS — C801 Malignant (primary) neoplasm, unspecified: Secondary | ICD-10-CM

## 2021-05-30 DIAGNOSIS — Z5111 Encounter for antineoplastic chemotherapy: Secondary | ICD-10-CM | POA: Diagnosis not present

## 2021-05-30 MED ORDER — HEPARIN SOD (PORK) LOCK FLUSH 100 UNIT/ML IV SOLN
500.0000 [IU] | Freq: Once | INTRAVENOUS | Status: AC | PRN
Start: 2021-05-30 — End: 2021-05-30
  Administered 2021-05-30: 500 [IU]
  Filled 2021-05-30: qty 5

## 2021-05-30 MED ORDER — SODIUM CHLORIDE 0.9% FLUSH
10.0000 mL | INTRAVENOUS | Status: DC | PRN
  Administered 2021-05-30: 10 mL
  Filled 2021-05-30: qty 10

## 2021-05-30 NOTE — Telephone Encounter (Signed)
Scheduled per 06/15 los, patient has been called and notified of all upcoming appointments.

## 2021-06-02 ENCOUNTER — Other Ambulatory Visit (HOSPITAL_COMMUNITY): Payer: Self-pay

## 2021-06-03 ENCOUNTER — Encounter: Payer: Self-pay | Admitting: Osteopathic Medicine

## 2021-06-03 ENCOUNTER — Telehealth: Payer: Self-pay | Admitting: *Deleted

## 2021-06-03 ENCOUNTER — Other Ambulatory Visit (HOSPITAL_COMMUNITY): Payer: Self-pay

## 2021-06-03 ENCOUNTER — Telehealth (INDEPENDENT_AMBULATORY_CARE_PROVIDER_SITE_OTHER): Payer: No Typology Code available for payment source | Admitting: Osteopathic Medicine

## 2021-06-03 VITALS — BP 117/70 | HR 77 | Temp 97.8°F | Wt 172.0 lb

## 2021-06-03 DIAGNOSIS — D619 Aplastic anemia, unspecified: Secondary | ICD-10-CM

## 2021-06-03 DIAGNOSIS — R5383 Other fatigue: Secondary | ICD-10-CM

## 2021-06-03 NOTE — Progress Notes (Signed)
Telemedicine Visit via  Video & Audio (App used: [email or web address])  I connected with Mary Stark on 06/03/21 at 3:25 PM  by phone or  telemedicine application as noted above  I verified that I am speaking with or regarding  the correct patient using two identifiers.  Participants: Myself, Dr [name] DO Patient: Mary Stark Patient proxy if applicable: none Other, if applicable: none  Patient is at home I am in office at [facility]    I discussed the limitations of evaluation and management  by telemedicine and the availability of in person appointments.  The participant(s) above expressed understanding and  agreed to proceed with this appointment via telemedicine.       History of Present Illness: Mary Stark is a 65 y.o. female who would like to discuss feeling significant fatigue, sore throat. Recently undergone different chemo regimen, last infusion 4 days ago. Hgb recently low but was told not low enough for transfusion.        Observations/Objective: BP 117/70   Pulse 77   Temp 97.8 F (36.6 C)   Wt 172 lb (78 kg)   BMI 27.76 kg/m  BP Readings from Last 3 Encounters:  06/03/21 117/70  05/30/21 121/72  05/28/21 104/70   Exam: Normal Speech.  NAD  Lab and Radiology Results No results found for this or any previous visit (from the past 72 hour(s)). No results found.     Assessment and Plan: 65 y.o. female with The primary encounter diagnosis was Other fatigue. 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  Labs and in office check tomorrow  ER precautions reviewed     I discussed the assessment and treatment plan with the patient. The patient was provided an opportunity to ask questions and all were answered. The patient agreed with the plan and demonstrated an understanding of the instructions.   The patient was advised to call back or seek an in-person evaluation if any new concerns, if symptoms  worsen or if the condition fails to improve as anticipated.  No charge for today since visit will be in person tomorrow for same.       . . . . . . . . . . . . . Marland Kitchen                   Historical information moved to improve visibility of documentation.  Past Medical History:  Diagnosis Date   Adenocarcinoma of bladder, stage 4 ([place]) 10/2020   oncologist--- dr Alen Blew and urologist-- dr pace;  w/ mets , started chemo 12/ 2021 and bilateral ureteral stents to treat hydronephrosis   Alcohol dependence ([place])    Anemia due to chemotherapy    Anxiety    Arthritis    Back and lt knee   CKD (chronic kidney disease), stage III ([place])    Family history of adverse reaction to anesthesia    father had a "tight" airway   GERD (gastroesophageal reflux disease)    History of 2019 novel coronavirus disease (COVID-19) 08/13/2020   positive result in epic 08-19-2020,  hospital admission in epic,  covid pneumonia with hypoxia respiratory failure, discharged with oxygen for 6 weeks   History of chemotherapy last done 05-13-2021   History of colitis 03/2018   acute colitis with sepsis   History of hepatitis C    TX FOR 2012   History of kidney stones    History of rheumatic fever as a  child    per pt no valvular issues   History of sepsis 10/2020   hospital admission in epic due to pyelonephritis due to hydronephrosis, resolved   History of skin cancer    excision from nose   Hyperlipidemia    Hypertension    MDD (major depressive disorder)    Seasonal allergies    Vitamin D deficiency    Past Surgical History:  Procedure Laterality Date   ANAL RECTAL MANOMETRY N/A 01/31/2020   Procedure: ANO RECTAL MANOMETRY;  Surgeon: Arta Silence, MD;  Location: [facility];  Service: Endoscopy;  Laterality: N/A;   CESAREAN SECTION  1984   CYSTOSCOPY W/ URETERAL STENT PLACEMENT Bilateral 01/21/2021   Procedure: CYSTOSCOPY WITH STENT REPLACEMENT;  Surgeon: Robley Fries,  MD;  [facility]  Service: Urology;  Laterality: Bilateral;  1 HR   CYSTOSCOPY W/ URETERAL STENT PLACEMENT Bilateral 05/20/2021   Procedure: CYSTOSCOPY WITH RETROGRADE PYELOGRAM/URETERAL STENT EXCHANGE;  Surgeon: Robley Fries, MD;  Location: [facility];  Service: Urology;  Laterality: Bilateral;   CYSTOSCOPY WITH RETROGRADE PYELOGRAM, URETEROSCOPY AND STENT PLACEMENT Bilateral 06/11/2020   Procedure: CYSTOSCOPY WITH RETROGRADE PYELOGRAM, URETEROSCOPY AND STENT PLACEMENT, RIGHT URETERAL BIOPSY;  Surgeon: Robley Fries, MD;  Location: [facility];  Service: Urology;  Laterality: Bilateral;  54 MINS   CYSTOSCOPY WITH RETROGRADE PYELOGRAM, URETEROSCOPY AND STENT PLACEMENT Bilateral 07/05/2020   Procedure: CYSTOSCOPY WITH RETROGRADE PYELOGRAM, URETEROSCOPY,  BIOPSIES AND STENT EXCHANGES;  Surgeon: Robley Fries, MD;  Location: [facility];  Service: Urology;  Laterality: Bilateral;   FINGER SURGERY Right    5th   GYNECOLOGIC CRYOSURGERY  YRS AGO   IR IMAGING GUIDED PORT INSERTION  11/18/2020   TONSILLECTOMY  AS CHILD   TRANSURETHRAL RESECTION OF BLADDER TUMOR  01/21/2021   Procedure: TRANSURETHRAL RESECTION OF BLADDER TUMOR (TURBT);  Surgeon: Robley Fries, MD;  Location: [facility];  Service: Urology;;   Social History   Tobacco Use   Smoking status: Former    Packs/day: 1.00    Years: 25.00    Pack years: 25.00    Types: Cigarettes    Quit date: 2012    Years since quitting: 10.4   Smokeless tobacco: Never  Substance Use Topics   Alcohol use: Yes    Alcohol/week: 24.0 - 36.0 standard drinks    Types: 24 - 36 Cans of beer per week    Comment: occ wine   family history includes Cardiomyopathy in her father; Liver cancer in her brother; Ovarian cancer in her mother; Protein C deficiency in her brother and brother; Stroke in her brother; Valvular heart disease in her father.  Medications: Current Outpatient  Medications  Medication Sig Dispense Refill   acetaminophen (TYLENOL) 500 MG tablet Take 1,000 mg by mouth every 8 (eight) hours as needed for moderate pain.      albuterol (PROAIR HFA) 108 (90 Base) MCG/ACT inhaler INHALE 1 TO 2 PUFFS BY MOUTH INTO THE LUNGS EVERY 4 HOURS AS NEEDED FOR WHEEZING OR SHORTNESS OF BREATH 8.5 g 1   amLODipine (NORVASC) 5 MG tablet TAKE 1 TABLET BY MOUTH ONCE A DAY (NEEDS OFFICE VISIT) 90 tablet 0   ARIPiprazole 10 MG TABS Take 10 mg by mouth daily. 90 tablet 1   ascorbic acid (VITAMIN C) 500 MG tablet Take 1,000 mg by mouth every evening.      buPROPion (WELLBUTRIN XL) 300 MG 24 hr tablet TAKE 1 TABLET BY MOUTH EVERY  DAY 30 tablet 3   Cholecalciferol (VITAMIN D) 125 MCG (5000 UT) CAPS Take 5,000 Units by mouth every evening.      clonazePAM (KLONOPIN) 0.5 MG tablet TAKE 1 TABLET BY MOUTH AT BEDTIME 30 tablet 0   Coenzyme Q10 (CO Q-10) 200 MG CAPS Take 200 mg by mouth every evening.      gabapentin (NEURONTIN) 300 MG capsule TAKE 1 CAPSULE BY MOUTH IN THE MORNING, 1 CAP IN THE AFTERNOON & 4 CAPS AT NIGHT 180 capsule 3   GLUCOSAMINE-CHONDROITIN PO Take 2 tablets by mouth every evening.      lidocaine-prilocaine (EMLA) cream Apply 1 application topically as needed. 30 g 0   mirabegron ER (MYRBETRIQ) 25 MG TB24 tablet TAKE 1 TABLET BY MOUTH ONCE A DAY 30 tablet 3   Nebivolol HCl 20 MG TABS Take 0.5 tablets (10 mg total) by mouth daily. 90 tablet 2   Omega-3 Fatty Acids (FISH OIL) 1200 MG CAPS Take 1,200 mg by mouth every evening.      omeprazole (PRILOSEC) 20 MG capsule TAKE 1 CAPSULE BY MOUTH ONCE A DAY * NEEDS APPT FOR REFILLS 90 capsule 0   ondansetron (ZOFRAN) 4 MG tablet Take 1 tablet (4 mg total) by mouth every 8 (eight) hours as needed for nausea or vomiting. 40 tablet 3   oxybutynin (DITROPAN) 5 MG tablet TAKE 1 TABLET BY MOUTH EVERY 8 HOURS AS NEEDED 90 tablet 3   oxymetazoline (AFRIN) 0.05 % nasal spray Place 1 spray into both nostrils 2 (two) times daily as  needed for congestion.     Phenazopyridine HCl (AZO URINARY PAIN PO) Take 1 tablet by mouth 3 (three) times daily as needed (bladder spasms).     simvastatin (ZOCOR) 20 MG tablet TAKE 1 TABLET (20 MG TOTAL) BY MOUTH DAILY. 90 tablet 0   tamsulosin (FLOMAX) 0.4 MG CAPS capsule TAKE 1 CAPSULE BY MOUTH AT BEDTIME 30 capsule 3   thiamine 100 MG tablet Take 1 tablet (100 mg total) by mouth daily. 30 tablet 0   vitamin B-12 (CYANOCOBALAMIN) 1000 MCG tablet Take 2,000 mcg by mouth every evening.      Vitamin E 180 MG (400 UNIT) CAPS Take 400 Units by mouth every evening.      No current facility-administered medications for this visit.   Allergies  Allergen Reactions   Metformin And Related Other (See Comments)    Lactic acidosis    Penicillins Hives and Rash    Reports hives to penicillin and amoxicillin as an adult "a long time ago." Has tolerated cefdinir (09/2020) and cefepime (10/2020) before.     If phone visit, billing and coding can please add appropriate modifier if needed

## 2021-06-04 ENCOUNTER — Other Ambulatory Visit (HOSPITAL_COMMUNITY): Payer: Self-pay

## 2021-06-04 ENCOUNTER — Other Ambulatory Visit: Payer: Self-pay

## 2021-06-04 ENCOUNTER — Ambulatory Visit (INDEPENDENT_AMBULATORY_CARE_PROVIDER_SITE_OTHER): Payer: No Typology Code available for payment source | Admitting: Osteopathic Medicine

## 2021-06-04 ENCOUNTER — Encounter: Payer: Self-pay | Admitting: Osteopathic Medicine

## 2021-06-04 VITALS — BP 130/69 | HR 71 | Temp 97.7°F | Wt 173.0 lb

## 2021-06-04 DIAGNOSIS — R5383 Other fatigue: Secondary | ICD-10-CM

## 2021-06-04 DIAGNOSIS — D649 Anemia, unspecified: Secondary | ICD-10-CM | POA: Diagnosis not present

## 2021-06-04 DIAGNOSIS — C801 Malignant (primary) neoplasm, unspecified: Secondary | ICD-10-CM

## 2021-06-04 LAB — CBC WITH DIFFERENTIAL/PLATELET
Absolute Monocytes: 151 cells/uL — ABNORMAL LOW (ref 200–950)
Basophils Absolute: 17 cells/uL (ref 0–200)
Basophils Relative: 0.2 %
Eosinophils Absolute: 8 cells/uL — ABNORMAL LOW (ref 15–500)
Eosinophils Relative: 0.1 %
HCT: 24.9 % — ABNORMAL LOW (ref 35.0–45.0)
Hemoglobin: 8.3 g/dL — ABNORMAL LOW (ref 11.7–15.5)
Lymphs Abs: 1487 cells/uL (ref 850–3900)
MCH: 32.4 pg (ref 27.0–33.0)
MCHC: 33.3 g/dL (ref 32.0–36.0)
MCV: 97.3 fL (ref 80.0–100.0)
MPV: 10.4 fL (ref 7.5–12.5)
Monocytes Relative: 1.8 %
Neutro Abs: 6737 cells/uL (ref 1500–7800)
Neutrophils Relative %: 80.2 %
Platelets: 176 10*3/uL (ref 140–400)
RBC: 2.56 10*6/uL — ABNORMAL LOW (ref 3.80–5.10)
RDW: 16.3 % — ABNORMAL HIGH (ref 11.0–15.0)
Total Lymphocyte: 17.7 %
WBC: 8.4 10*3/uL (ref 3.8–10.8)

## 2021-06-04 LAB — COMPLETE METABOLIC PANEL WITH GFR
AG Ratio: 1.3 (calc) (ref 1.0–2.5)
ALT: 9 U/L (ref 6–29)
AST: 12 U/L (ref 10–35)
Albumin: 4 g/dL (ref 3.6–5.1)
Alkaline phosphatase (APISO): 75 U/L (ref 37–153)
BUN/Creatinine Ratio: 19 (calc) (ref 6–22)
BUN: 25 mg/dL (ref 7–25)
CO2: 23 mmol/L (ref 20–32)
Calcium: 8.8 mg/dL (ref 8.6–10.4)
Chloride: 102 mmol/L (ref 98–110)
Creat: 1.32 mg/dL — ABNORMAL HIGH (ref 0.50–0.99)
GFR, Est African American: 50 mL/min/{1.73_m2} — ABNORMAL LOW (ref >=60)
GFR, Est Non African American: 43 mL/min/{1.73_m2} — ABNORMAL LOW (ref >=60)
Globulin: 3.2 g/dL (calc) (ref 1.9–3.7)
Glucose, Bld: 102 mg/dL — ABNORMAL HIGH (ref 65–99)
Potassium: 4.6 mmol/L (ref 3.5–5.3)
Sodium: 136 mmol/L (ref 135–146)
Total Bilirubin: 0.5 mg/dL (ref 0.2–1.2)
Total Protein: 7.2 g/dL (ref 6.1–8.1)

## 2021-06-04 LAB — PHOSPHORUS: Phosphorus: 3.2 mg/dL (ref 2.5–4.5)

## 2021-06-04 LAB — TSH: TSH: 2.29 mIU/L (ref 0.40–4.50)

## 2021-06-04 LAB — MAGNESIUM: Magnesium: 1.4 mg/dL — ABNORMAL LOW (ref 1.5–2.5)

## 2021-06-04 MED ORDER — MEGESTROL ACETATE 40 MG PO TABS
40.0000 mg | ORAL_TABLET | Freq: Two times a day (BID) | ORAL | 1 refills | Status: DC
  Filled 2021-06-04: qty 60, 30d supply, fill #0

## 2021-06-04 NOTE — Progress Notes (Signed)
Mary Stark is a 65 y.o. female who presents to  [place] [facility]  today, 06/04/21, seeking care for the following:  Severe fatigue - is undergoing chemotherapy for bladder cancer. Concerned about low appetite and low energy. No dysuria. No fever/chills. No cough/SOB.      ASSESSMENT & PLAN with other pertinent findings:  The primary encounter diagnosis was Other fatigue. Diagnoses of Anemia, unspecified type and Malignancy ([place]) were also pertinent to this visit.   ABout the best I can do at this point is get labs and make sure no serious abnormalities. Sent Megace to help w/ appetite/ will forward results to oncology team. She is advised to contact them if concern for appetite issues, there may be some nutritionist resources she can utilize. Most of her symptoms seem related to chemo without any alarm signs for serious complication but she is encouraged to contact her oncology team.   Patient Instructions  Labs today, will forward to oncology & urology. Please seek emergency care if severe pain, fever/weakness, other serious concerns!   Orders Placed This Encounter  Procedures   Urine Culture   COMPLETE METABOLIC PANEL WITH GFR   Urinalysis with Culture Reflex   CBC with Differential/Platelet   COMPLETE METABOLIC PANEL WITH GFR   TSH   Magnesium   Phosphorus   Urinalysis, Routine w reflex microscopic   Urine Microscopic    Meds ordered this encounter  Medications   megestrol (MEGACE) 40 MG tablet    Sig: Take 1 tablet (40 mg total) by mouth 2 (two) times daily.    Dispense:  60 tablet    Refill:  1     See below for relevant physical exam findings  See below for recent lab and imaging results reviewed  Medications, allergies, PMH, PSH, SocH, FamH reviewed below    Follow-up instructions: Return for RECHECK PENDING RESULTS / IF WORSE OR  CHANGE.                                        Exam:  BP 130/69 (BP Location: Left Arm, Patient Position: Sitting, Cuff Size: Normal)   Pulse 71   Temp 97.7 F (36.5 C) (Oral)   Wt 173 lb (78.5 kg)   SpO2 100%   BMI 27.92 kg/m  Constitutional: VS see above. General Appearance: alert, well-developed, well-nourished, NAD Neck: No masses, trachea midline.  Respiratory: Normal respiratory effort. no wheeze, no rhonchi, no rales Cardiovascular: S1/S2 normal, no murmur, no rub/gallop auscultated. RRR.  Musculoskeletal: Gait normal.  Neurological: Normal balance/coordination. No tremor. Skin: warm, dry, intact.  Psychiatric: Normal judgment/insight. Flat mood and affect. Oriented x3.   Current Meds  Medication Sig   acetaminophen (TYLENOL) 500 MG tablet Take 1,000 mg by mouth every 8 (eight) hours as needed for moderate pain.    albuterol (PROAIR HFA) 108 (90 Base) MCG/ACT inhaler INHALE 1 TO 2 PUFFS BY MOUTH INTO THE LUNGS EVERY 4 HOURS AS NEEDED FOR WHEEZING OR SHORTNESS OF BREATH   ARIPiprazole 10 MG TABS Take 10 mg by mouth daily.   ascorbic acid (VITAMIN C) 500 MG tablet Take 1,000 mg by mouth every evening.    buPROPion (WELLBUTRIN XL) 300 MG 24 hr tablet TAKE 1 TABLET BY MOUTH EVERY DAY   Cholecalciferol (VITAMIN D) 125 MCG (5000 UT) CAPS Take 5,000 Units by mouth every evening.  clonazePAM (KLONOPIN) 0.5 MG tablet TAKE 1 TABLET BY MOUTH AT BEDTIME   Coenzyme Q10 (CO Q-10) 200 MG CAPS Take 200 mg by mouth every evening.    gabapentin (NEURONTIN) 300 MG capsule TAKE 1 CAPSULE BY MOUTH IN THE MORNING, 1 CAP IN THE AFTERNOON & 4 CAPS AT NIGHT   GLUCOSAMINE-CHONDROITIN PO Take 2 tablets by mouth every evening.    lidocaine-prilocaine (EMLA) cream Apply 1 application topically as needed.   megestrol (MEGACE) 40 MG tablet Take 1 tablet (40 mg total) by mouth 2 (two) times daily.   mirabegron ER (MYRBETRIQ) 25 MG TB24 tablet TAKE 1 TABLET BY MOUTH  ONCE A DAY   Nebivolol HCl 20 MG TABS Take 0.5 tablets (10 mg total) by mouth daily.   Omega-3 Fatty Acids (FISH OIL) 1200 MG CAPS Take 1,200 mg by mouth every evening.    omeprazole (PRILOSEC) 20 MG capsule TAKE 1 CAPSULE BY MOUTH ONCE A DAY * NEEDS APPT FOR REFILLS   ondansetron (ZOFRAN) 4 MG tablet Take 1 tablet (4 mg total) by mouth every 8 (eight) hours as needed for nausea or vomiting.   oxybutynin (DITROPAN) 5 MG tablet TAKE 1 TABLET BY MOUTH EVERY 8 HOURS AS NEEDED   oxymetazoline (AFRIN) 0.05 % nasal spray Place 1 spray into both nostrils 2 (two) times daily as needed for congestion.   Phenazopyridine HCl (AZO URINARY PAIN PO) Take 1 tablet by mouth 3 (three) times daily as needed (bladder spasms).   simvastatin (ZOCOR) 20 MG tablet TAKE 1 TABLET (20 MG TOTAL) BY MOUTH DAILY.   tamsulosin (FLOMAX) 0.4 MG CAPS capsule TAKE 1 CAPSULE BY MOUTH AT BEDTIME   thiamine 100 MG tablet Take 1 tablet (100 mg total) by mouth daily.   vitamin B-12 (CYANOCOBALAMIN) 1000 MCG tablet Take 2,000 mcg by mouth every evening.    Vitamin E 180 MG (400 UNIT) CAPS Take 400 Units by mouth every evening.     Allergies  Allergen Reactions   Metformin And Related Other (See Comments)    Lactic acidosis    Penicillins Hives and Rash    Reports hives to penicillin and amoxicillin as an adult "a long time ago." Has tolerated cefdinir (09/2020) and cefepime (10/2020) before.    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   Sepsis secondary to UTI ([place]) 10/22/2020   Normocytic anemia 10/22/2020   Hyperproteinemia 10/22/2020   Hydronephrosis 10/01/2020   Proteinuria 10/01/2020   Abnormal CT of the abdomen 10/01/2020   Bilateral lower extremity edema 09/12/2020   Acute hypoxemic respiratory failure due to COVID-19 ([place]) 08/19/2020   Tear of medial meniscus of left knee, current 07/13/2019   Internal derangement of knee involving posterior horn of lateral meniscus, left 07/13/2019   Primary osteoarthritis of both knees 07/13/2019   Renal insufficiency 07/13/2019   Anxiety about health 03/22/2019   Hypokalemia 03/21/2019   Essential hypertension 02/13/2019   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/2018   Anemia due to blood loss, acute 04/14/2018   Hypomagnesemia 04/14/2018   Hypocalcemia 04/14/2018  Tobacco abuse 04/08/2018   Depression 04/07/2018   AKI (acute kidney injury) ([place]) 04/07/2018   Sepsis ([place]) 04/07/2018   Acute colitis 04/07/2018   Avascular necrosis of humeral head, right ([place]) 03/30/2018   NSAID long-term use 03/30/2018   Goals of care, counseling/discussion 03/30/2018   Seborrheic keratoses 03/15/2018   History of nonmelanoma skin cancer 03/02/2018   Skin lesion of chest wall 03/02/2018   Alcohol dependence with unspecified alcohol-induced disorder ([place]) 02/11/2018   Severe episode of recurrent major depressive disorder, without psychotic features ([place]) 02/11/2018   Transaminitis 01/06/2018   Nicotine dependence 01/06/2018   Heavy alcohol consumption 01/06/2018   Encounter for monitoring statin therapy 01/06/2018   Class 1 obesity due to excess calories with serious comorbidity in adult 01/06/2018   Chronic pain syndrome 04/21/2017   Chronic right shoulder pain 07/03/2015   Vaginismus 04/12/2014   Menopausal state 11/08/2013   Family history of ovarian cancer 09/27/2013   Postmenopausal atrophic vaginitis 09/12/2013   Personal history of colonic polyps 07/18/2013    Dyslipidemia, goal LDL below 100 07/17/2013   Depression with anxiety 07/17/2013   Hx of hepatitis C 07/17/2013   Vitamin D deficiency 07/17/2013   Right lumbar radiculopathy 07/17/2013   History of alcohol dependence ([place]) 07/17/2013   H/O: rheumatic fever 06/30/2012   Insomnia 06/30/2012   Personal history of other infectious and parasitic diseases 06/30/2012    Family History  Problem Relation Age of Onset   Ovarian cancer Mother    Cardiomyopathy Father    Valvular heart disease Father    Liver cancer Brother    Protein C deficiency Brother    Protein C deficiency Brother    Stroke Brother     Social History   Tobacco Use  Smoking Status Former   Packs/day: 1.00   Years: 25.00   Pack years: 25.00   Types: Cigarettes   Quit date: 2012   Years since quitting: 10.4  Smokeless Tobacco Never    Past Surgical History:  Procedure Laterality Date   ANAL RECTAL MANOMETRY N/A 01/31/2020   Procedure: ANO RECTAL MANOMETRY;  Surgeon: Arta Silence, MD;  Location: [facility];  Service: Endoscopy;  Laterality: N/A;   CESAREAN SECTION  1984   CYSTOSCOPY W/ URETERAL STENT PLACEMENT Bilateral 01/21/2021   Procedure: CYSTOSCOPY WITH STENT REPLACEMENT;  Surgeon: Robley Fries, MD;  Location: [facility];  Service: Urology;  Laterality: Bilateral;  1 HR   CYSTOSCOPY W/ URETERAL STENT PLACEMENT Bilateral 05/20/2021   Procedure: CYSTOSCOPY WITH RETROGRADE PYELOGRAM/URETERAL STENT EXCHANGE;  Surgeon: Robley Fries, MD;  Location: [facility];  Service: Urology;  Laterality: Bilateral;   CYSTOSCOPY WITH RETROGRADE PYELOGRAM, URETEROSCOPY AND STENT PLACEMENT Bilateral 06/11/2020   Procedure: CYSTOSCOPY WITH RETROGRADE PYELOGRAM, URETEROSCOPY AND STENT PLACEMENT, RIGHT URETERAL BIOPSY;  Surgeon: Robley Fries, MD;  Location: [facility];  Service: Urology;  Laterality: Bilateral;  67 MINS   CYSTOSCOPY WITH RETROGRADE PYELOGRAM, URETEROSCOPY AND STENT  PLACEMENT Bilateral 07/05/2020   Procedure: CYSTOSCOPY WITH RETROGRADE PYELOGRAM, URETEROSCOPY,  BIOPSIES AND STENT EXCHANGES;  Surgeon: Robley Fries, MD;  Location: [facility];  Service: Urology;  Laterality: Bilateral;   FINGER SURGERY Right    5th   GYNECOLOGIC CRYOSURGERY  YRS AGO   IR IMAGING GUIDED PORT INSERTION  11/18/2020   TONSILLECTOMY  AS CHILD   TRANSURETHRAL RESECTION OF BLADDER TUMOR  01/21/2021   Procedure: TRANSURETHRAL RESECTION OF BLADDER TUMOR (TURBT);  Surgeon: Robley Fries, MD;  Location:  [facility];  Service: Urology;;    Immunization History  Administered Date(s) Administered   Influenza,inj,Quad PF,6+ Mos 11/13/2020   Influenza-Unspecified 09/13/2018, 09/13/2019   PFIZER(Purple Top)SARS-COV-2 Vaccination 12/11/2019, 01/01/2020, 12/12/2020   Tdap 10/31/2015    Recent Results (from the past 2160 hour(s))  Sample to Blood Bank     Status: None   Collection Time: 03/13/21 12:13 PM  Result Value Ref Range   Blood Bank Specimen SAMPLE AVAILABLE FOR TESTING    Sample Expiration      03/16/2021,2359 Performed at [facility], [place] [address], [place], [place] [address]   CMP ([place] only)     Status: Abnormal   Collection Time: 03/13/21 12:13 PM  Result Value Ref Range   Sodium 138 135 - 145 mmol/L   Potassium 4.6 3.5 - 5.1 mmol/L   Chloride 106 98 - 111 mmol/L   CO2 23 22 - 32 mmol/L   Glucose, Bld 91 70 - 99 mg/dL    Comment: Glucose reference range applies only to samples taken after fasting for at least 8 hours.   BUN 24 (H) 8 - 23 mg/dL   Creatinine 1.13 (H) 0.44 - 1.00 mg/dL   Calcium 8.4 (L) 8.9 - 10.3 mg/dL   Total Protein 7.2 6.5 - 8.1 g/dL   Albumin 3.7 3.5 - 5.0 g/dL   AST 23 15 - 41 U/L   ALT 17 0 - 44 U/L   Alkaline Phosphatase 88 38 - 126 U/L   Total Bilirubin <0.2 (L) 0.3 - 1.2 mg/dL    Comment: REPEATED TO VERIFY   GFR, Estimated 55 (L) >60 mL/min    Comment:  (NOTE) Calculated using the CKD-EPI Creatinine Equation (2021)    Anion gap 9 5 - 15    Comment: Performed at [facility], [place] [address] [place], [place] [address]  CBC with Differential ([place] Only)     Status: Abnormal   Collection Time: 03/13/21 12:13 PM  Result Value Ref Range   WBC Count 2.6 (L) 4.0 - 10.5 K/uL   RBC 2.15 (L) 3.87 - 5.11 MIL/uL   Hemoglobin 7.5 (L) 12.0 - 15.0 g/dL   HCT 23.2 (L) 36.0 - 46.0 %   MCV 107.9 (H) 80.0 - 100.0 fL   MCH 34.9 (H) 26.0 - 34.0 pg   MCHC 32.3 30.0 - 36.0 g/dL   RDW 17.2 (H) 11.5 - 15.5 %   Platelet Count 230 150 - 400 K/uL   nRBC 0.8 (H) 0.0 - 0.2 %   Neutrophils Relative % 26 %   Neutro Abs 0.7 (L) 1.7 - 7.7 K/uL   Lymphocytes Relative 58 %   Lymphs Abs 1.5 0.7 - 4.0 K/uL   Monocytes Relative 12 %   Monocytes Absolute 0.3 0.1 - 1.0 K/uL   Eosinophils Relative 1 %   Eosinophils Absolute 0.0 0.0 - 0.5 K/uL   Basophils Relative 1 %   Basophils Absolute 0.0 0.0 - 0.1 K/uL   Immature Granulocytes 2 %   Abs Immature Granulocytes 0.06 0.00 - 0.07 K/uL    Comment: Performed at [facility], [address] [place], [place] [address]  Type and screen     Status: None   Collection Time: 03/13/21 12:41 PM  Result Value Ref Range   ABO/RH(D) A POS    Antibody Screen NEG    Sample Expiration 03/16/2021,2359    Unit Number [id number]    Blood Component Type  RED CELLS,LR    Unit division 00    Status of Unit ISSUED,FINAL    Transfusion Status OK TO TRANSFUSE    Crossmatch Result Compatible    Unit Number [id number]    Blood Component Type RED CELLS,LR    Unit division 00    Status of Unit ISSUED,FINAL    Transfusion Status OK TO TRANSFUSE    Crossmatch Result      Compatible Performed at [place] [address] [place], [place] [address]   BPAM RBC     Status: None   Collection Time: 03/13/21 12:41 PM  Result Value Ref Range   ISSUE DATE /  TIME [id number]    Blood Product Unit Number [id number]    PRODUCT CODE [id number]    Unit Type and Rh 6200    Blood Product Expiration Date 833825053976    ISSUE DATE / TIME [id number]    Blood Product Unit Number [id number]    PRODUCT CODE [id number]    Unit Type and Rh 6200    Blood Product Expiration Date 979892119417   Prepare RBC (crossmatch)     Status: None   Collection Time: 03/13/21 12:42 PM  Result Value Ref Range   Order Confirmation      ORDER PROCESSED BY BLOOD BANK Performed at [facility], [place] [address] [place], [place] [address]   Sample to Blood Bank     Status: None   Collection Time: 03/27/21  2:24 PM  Result Value Ref Range   Blood Bank Specimen SAMPLE AVAILABLE FOR TESTING    Sample Expiration      03/30/2021,2359 Performed at [facility], [place] [address], [place], [place] [address]   CMP ([place] only)     Status: Abnormal   Collection Time: 03/27/21  2:24 PM  Result Value Ref Range   Sodium 136 135 - 145 mmol/L   Potassium 4.8 3.5 - 5.1 mmol/L   Chloride 106 98 - 111 mmol/L   CO2 19 (L) 22 - 32 mmol/L   Glucose, Bld 108 (H) 70 - 99 mg/dL    Comment: Glucose reference range applies only to samples taken after fasting for at least 8 hours.   BUN 30 (H) 8 - 23 mg/dL   Creatinine 1.26 (H) 0.44 - 1.00 mg/dL   Calcium 9.1 8.9 - 10.3 mg/dL   Total Protein 8.0 6.5 - 8.1 g/dL   Albumin 3.8 3.5 - 5.0 g/dL   AST 18 15 - 41 U/L   ALT 11 0 - 44 U/L   Alkaline Phosphatase 93 38 - 126 U/L   Total Bilirubin <0.2 (L) 0.3 - 1.2 mg/dL    Comment: REPEATED TO VERIFY   GFR, Estimated 48 (L) >60 mL/min    Comment: (NOTE) Calculated using the CKD-EPI Creatinine Equation (2021)    Anion gap 11 5 - 15    Comment: Performed at [facility], [place] [address], [place],  [address]  CBC with Differential ([place] Only)     Status: Abnormal   Collection Time: 03/27/21  2:24 PM  Result  Value Ref Range   WBC Count 6.1 4.0 - 10.5 K/uL   RBC 3.31 (L) 3.87 - 5.11 MIL/uL   Hemoglobin 11.0 (L) 12.0 - 15.0 g/dL   HCT 34.0 (L) 36.0 - 46.0 %   MCV 102.7 (H) 80.0 - 100.0 fL   MCH 33.2 26.0 - 34.0 pg   MCHC 32.4 30.0 - 36.0  g/dL   RDW 19.9 (H) 11.5 - 15.5 %   Platelet Count 187 150 - 400 K/uL   nRBC 0.0 0.0 - 0.2 %   Neutrophils Relative % 50 %   Neutro Abs 3.0 1.7 - 7.7 K/uL   Lymphocytes Relative 29 %   Lymphs Abs 1.8 0.7 - 4.0 K/uL   Monocytes Relative 18 %   Monocytes Absolute 1.1 (H) 0.1 - 1.0 K/uL   Eosinophils Relative 1 %   Eosinophils Absolute 0.1 0.0 - 0.5 K/uL   Basophils Relative 1 %   Basophils Absolute 0.1 0.0 - 0.1 K/uL   Immature Granulocytes 1 %   Abs Immature Granulocytes 0.07 0.00 - 0.07 K/uL    Comment: Performed at [facility], [place] [address], [place], [place] [address]  Sample to Blood Bank     Status: None   Collection Time: 04/15/21 10:49 AM  Result Value Ref Range   Blood Bank Specimen SAMPLE AVAILABLE FOR TESTING    Sample Expiration      04/18/2021,2359 Performed at [place] [address] [place], [place] [address]   CMP ([place] only)     Status: Abnormal   Collection Time: 04/15/21 10:49 AM  Result Value Ref Range   Sodium 137 135 - 145 mmol/L   Potassium 5.0 3.5 - 5.1 mmol/L   Chloride 105 98 - 111 mmol/L   CO2 24 22 - 32 mmol/L   Glucose, Bld 114 (H) 70 - 99 mg/dL    Comment: Glucose reference range applies only to samples taken after fasting for at least 8 hours.   BUN 22 8 - 23 mg/dL   Creatinine 1.39 (H) 0.44 - 1.00 mg/dL   Calcium 9.0 8.9 - 10.3 mg/dL   Total Protein 7.8 6.5 - 8.1 g/dL   Albumin 3.6 3.5 - 5.0 g/dL   AST 16 15 - 41 U/L   ALT 8 0 - 44 U/L   Alkaline Phosphatase 77 38 - 126 U/L   Total Bilirubin <0.2 (L) 0.3 - 1.2 mg/dL   GFR, Estimated 43 (L) >60 mL/min    Comment: (NOTE) Calculated using the CKD-EPI Creatinine Equation (2021)    Anion gap 8 5 - 15     Comment: Performed at [facility], [place] [address] [place], [place] [address]  CBC with Differential ([place] Only)     Status: Abnormal   Collection Time: 04/15/21 10:49 AM  Result Value Ref Range   WBC Count 6.5 4.0 - 10.5 K/uL   RBC 3.12 (L) 3.87 - 5.11 MIL/uL   Hemoglobin 10.2 (L) 12.0 - 15.0 g/dL   HCT 32.1 (L) 36.0 - 46.0 %   MCV 102.9 (H) 80.0 - 100.0 fL   MCH 32.7 26.0 - 34.0 pg   MCHC 31.8 30.0 - 36.0 g/dL   RDW 17.6 (H) 11.5 - 15.5 %   Platelet Count 195 150 - 400 K/uL   nRBC 0.0 0.0 - 0.2 %   Neutrophils Relative % 52 %   Neutro Abs 3.4 1.7 - 7.7 K/uL   Lymphocytes Relative 31 %   Lymphs Abs 2.0 0.7 - 4.0 K/uL   Monocytes Relative 12 %   Monocytes Absolute 0.8 0.1 - 1.0 K/uL   Eosinophils Relative 4 %   Eosinophils Absolute 0.2 0.0 - 0.5 K/uL   Basophils Relative 1 %   Basophils Absolute 0.1 0.0 - 0.1 K/uL   Immature Granulocytes 0 %  Abs Immature Granulocytes 0.02 0.00 - 0.07 K/uL    Comment: Performed at [facility], [place] [address] [place], [place] [address]  CBC with Differential ([place] Only)     Status: Abnormal   Collection Time: 04/30/21 11:07 AM  Result Value Ref Range   WBC Count 4.4 4.0 - 10.5 K/uL   RBC 2.79 (L) 3.87 - 5.11 MIL/uL   Hemoglobin 9.2 (L) 12.0 - 15.0 g/dL   HCT 29.0 (L) 36.0 - 46.0 %   MCV 103.9 (H) 80.0 - 100.0 fL   MCH 33.0 26.0 - 34.0 pg   MCHC 31.7 30.0 - 36.0 g/dL   RDW 17.1 (H) 11.5 - 15.5 %   Platelet Count 150 150 - 400 K/uL   nRBC 0.0 0.0 - 0.2 %   Neutrophils Relative % 44 %   Neutro Abs 1.9 1.7 - 7.7 K/uL   Lymphocytes Relative 34 %   Lymphs Abs 1.5 0.7 - 4.0 K/uL   Monocytes Relative 17 %   Monocytes Absolute 0.7 0.1 - 1.0 K/uL   Eosinophils Relative 4 %   Eosinophils Absolute 0.2 0.0 - 0.5 K/uL   Basophils Relative 1 %   Basophils Absolute 0.0 0.0 - 0.1 K/uL   Immature Granulocytes 0 %   Abs Immature Granulocytes 0.01 0.00 - 0.07 K/uL    Comment:  Performed at [facility], [place] [address] [place], [place] [address]  CMP ([place] only)     Status: Abnormal   Collection Time: 04/30/21 11:07 AM  Result Value Ref Range   Sodium 138 135 - 145 mmol/L   Potassium 3.9 3.5 - 5.1 mmol/L   Chloride 106 98 - 111 mmol/L   CO2 23 22 - 32 mmol/L   Glucose, Bld 86 70 - 99 mg/dL    Comment: Glucose reference range applies only to samples taken after fasting for at least 8 hours.   BUN 19 8 - 23 mg/dL   Creatinine 1.26 (H) 0.44 - 1.00 mg/dL   Calcium 8.8 (L) 8.9 - 10.3 mg/dL   Total Protein 7.3 6.5 - 8.1 g/dL   Albumin 3.4 (L) 3.5 - 5.0 g/dL   AST 19 15 - 41 U/L   ALT 10 0 - 44 U/L   Alkaline Phosphatase 73 38 - 126 U/L   Total Bilirubin <0.2 (L) 0.3 - 1.2 mg/dL   GFR, Estimated 48 (L) >60 mL/min    Comment: (NOTE) Calculated using the CKD-EPI Creatinine Equation (2021)    Anion gap 9 5 - 15    Comment: Performed at [facility], [place] [address], [place], [place] [address]  Sample to Blood Bank     Status: None   Collection Time: 04/30/21 11:07 AM  Result Value Ref Range   Blood Bank Specimen SAMPLE AVAILABLE FOR TESTING    Sample Expiration      05/03/2021,2359 Performed at [place] [address], [place], [place] [address]   Urinalysis, Routine w reflex microscopic     Status: Abnormal   Collection Time: 05/01/21  5:35 AM  Result Value Ref Range   Color, Urine YELLOW YELLOW   APPearance CLEAR CLEAR   Specific Gravity, Urine 1.014 1.005 - 1.030   pH 6.0 5.0 - 8.0   Glucose, UA NEGATIVE NEGATIVE mg/dL   Hgb urine dipstick MODERATE (A) NEGATIVE   Bilirubin Urine NEGATIVE NEGATIVE   Ketones, ur NEGATIVE NEGATIVE mg/dL   Protein, ur 100 (  A) NEGATIVE mg/dL   Nitrite NEGATIVE NEGATIVE   Leukocytes,Ua SMALL (A) NEGATIVE   RBC / HPF 0-5 0 - 5 RBC/hpf   WBC, UA 0-5 0 - 5 WBC/hpf   Bacteria, UA NONE SEEN NONE SEEN   Squamous Epithelial / LPF 0-5 0 - 5     Comment: Performed at [facility], [place] [address] [place], [place] [address]  Urine culture     Status: None   Collection Time: 05/01/21  5:35 AM   Specimen: Urine, Clean Catch  Result Value Ref Range   Specimen Description      URINE, CLEAN CATCH Performed at [facility], [place] [address], [place], [place] [address]    Special Requests      NONE Performed at [facility], [place] [address], [place], [place] [address]    Culture      NO GROWTH Performed at [facility], [place] [address], [place], [place] [address]    Report Status 05/02/2021 FINAL   SARS CORONAVIRUS 2 (TAT 6-24 HRS) Nasopharyngeal Nasopharyngeal Swab     Status: None   Collection Time: 05/01/21  6:07 AM   Specimen: Nasopharyngeal Swab  Result Value Ref Range   SARS Coronavirus 2 NEGATIVE NEGATIVE    Comment: (NOTE) SARS-CoV-2 target nucleic acids are NOT DETECTED.  The SARS-CoV-2 RNA is generally detectable in upper and lower respiratory specimens during the acute phase of infection. Negative results do not preclude SARS-CoV-2 infection, do not rule out co-infections with other pathogens, and should not be used as the sole basis for treatment or other patient management decisions. Negative results must be combined with clinical observations, patient history, and epidemiological information. The expected result is Negative.  Fact Sheet for Patients: SugarRoll.be  Fact Sheet for Healthcare Providers: https://www.woods-mathews.com/  This test is not yet approved or cleared by the [place] FDA and  has been authorized for detection and/or diagnosis of SARS-CoV-2 by FDA under an Emergency Use Authorization (EUA). This EUA will remain  in effect (meaning this test can be used) for the duration of the COVID-19 declaration under Se ction 564(b)(1) of the Act, 21 U.S.C. section 360bbb-3(b)(1), unless the  authorization is terminated or revoked sooner.  Performed at [facility], [place] [address] [place], [place] [address]   Lactic acid, plasma     Status: None   Collection Time: 05/01/21  6:19 AM  Result Value Ref Range   Lactic Acid, Venous 0.9 0.5 - 1.9 mmol/L    Comment: Performed at [facility], [place] [address], [place], [place] [address]  Comprehensive metabolic panel     Status: Abnormal   Collection Time: 05/01/21  6:19 AM  Result Value Ref Range   Sodium 134 (L) 135 - 145 mmol/L   Potassium 3.6 3.5 - 5.1 mmol/L   Chloride 105 98 - 111 mmol/L   CO2 21 (L) 22 - 32 mmol/L   Glucose, Bld 112 (H) 70 - 99 mg/dL    Comment: Glucose reference range applies only to samples taken after fasting for at least 8 hours.   BUN 23 8 - 23 mg/dL   Creatinine, Ser 1.34 (H) 0.44 - 1.00 mg/dL   Calcium 8.4 (L) 8.9 - 10.3 mg/dL   Total Protein 6.9 6.5 - 8.1 g/dL   Albumin 3.4 (L) 3.5 - 5.0 g/dL   AST 19 15 - 41 U/L   ALT 15 0 - 44 U/L   Alkaline  Phosphatase 63 38 - 126 U/L   Total Bilirubin 0.4 0.3 - 1.2 mg/dL   GFR, Estimated 45 (L) >60 mL/min    Comment: (NOTE) Calculated using the CKD-EPI Creatinine Equation (2021)    Anion gap 8 5 - 15    Comment: Performed at [facility], [place] [address], [place], [place] [address]  CBC WITH DIFFERENTIAL     Status: Abnormal   Collection Time: 05/01/21  6:19 AM  Result Value Ref Range   WBC 3.0 (L) 4.0 - 10.5 K/uL   RBC 2.84 (L) 3.87 - 5.11 MIL/uL   Hemoglobin 9.4 (L) 12.0 - 15.0 g/dL   HCT 29.2 (L) 36.0 - 46.0 %   MCV 102.8 (H) 80.0 - 100.0 fL   MCH 33.1 26.0 - 34.0 pg   MCHC 32.2 30.0 - 36.0 g/dL   RDW 17.1 (H) 11.5 - 15.5 %   Platelets 139 (L) 150 - 400 K/uL   nRBC 0.0 0.0 - 0.2 %   Neutrophils Relative % 70 %   Neutro Abs 2.1 1.7 - 7.7 K/uL   Lymphocytes Relative 10 %   Lymphs Abs 0.3 (L) 0.7 - 4.0 K/uL   Monocytes Relative 17 %   Monocytes Absolute 0.5 0.1 - 1.0 K/uL   Eosinophils Relative 1 %    Eosinophils Absolute 0.0 0.0 - 0.5 K/uL   Basophils Relative 1 %   Basophils Absolute 0.0 0.0 - 0.1 K/uL   Immature Granulocytes 1 %   Abs Immature Granulocytes 0.02 0.00 - 0.07 K/uL    Comment: Performed at [facility], [place] [address] [place], [place] [address]  Protime-INR     Status: None   Collection Time: 05/01/21  6:19 AM  Result Value Ref Range   Prothrombin Time 13.8 11.4 - 15.2 seconds   INR 1.1 0.8 - 1.2    Comment: (NOTE) INR goal varies based on device and disease states. Performed at [facility], [place] [address], [place], [place] [address]   APTT     Status: None   Collection Time: 05/01/21  6:19 AM  Result Value Ref Range   aPTT 28 24 - 36 seconds    Comment: Performed at [facility], [place] [address], [place], [place] [address]  Blood Culture (routine x 2)     Status: None   Collection Time: 05/01/21  6:19 AM   Specimen: BLOOD RIGHT HAND  Result Value Ref Range   Specimen Description      BLOOD RIGHT HAND Performed at [place] [address], [place], [place] [address]    Special Requests      BOTTLES DRAWN AEROBIC AND ANAEROBIC Blood Culture adequate volume Performed at [place] [address], [place], [place] [address]    Culture      NO GROWTH 5 DAYS Performed at [facility], [place] [address] [place], [place] [address]    Report Status 05/06/2021 FINAL   Group A Strep by PCR     Status: None   Collection Time: 05/01/21  6:24 AM   Specimen: Throat; Sterile Swab  Result Value Ref Range   Group A Strep by PCR NOT DETECTED NOT DETECTED    Comment: Performed at [facility], [place] [address] [place], [place] [address]  Resp Panel by RT-PCR (Flu A&B, Covid) Nasopharyngeal Swab     Status: None   Collection Time: 05/01/21  7:19 AM  Specimen: Nasopharyngeal Swab; Nasopharyngeal(NP) swabs in vial transport medium  Result Value  Ref Range   SARS Coronavirus 2 by RT PCR NEGATIVE NEGATIVE    Comment: (NOTE) SARS-CoV-2 target nucleic acids are NOT DETECTED.  The SARS-CoV-2 RNA is generally detectable in upper respiratory specimens during the acute phase of infection. The lowest concentration of SARS-CoV-2 viral copies this assay can detect is 138 copies/mL. A negative result does not preclude SARS-Cov-2 infection and should not be used as the sole basis for treatment or other patient management decisions. A negative result may occur with  improper specimen collection/handling, submission of specimen other than nasopharyngeal swab, presence of viral mutation(s) within the areas targeted by this assay, and inadequate number of viral copies(<138 copies/mL). A negative result must be combined with clinical observations, patient history, and epidemiological information. The expected result is Negative.  Fact Sheet for Patients:  EntrepreneurPulse.com.au  Fact Sheet for Healthcare Providers:  IncredibleEmployment.be  This test is no t yet approved or cleared by the [place] FDA and  has been authorized for detection and/or diagnosis of SARS-CoV-2 by FDA under an Emergency Use Authorization (EUA). This EUA will remain  in effect (meaning this test can be used) for the duration of the COVID-19 declaration under Section 564(b)(1) of the Act, 21 U.S.C.section 360bbb-3(b)(1), unless the authorization is terminated  or revoked sooner.       Influenza A by PCR NEGATIVE NEGATIVE   Influenza B by PCR NEGATIVE NEGATIVE    Comment: (NOTE) The Xpert Xpress SARS-CoV-2/FLU/RSV plus assay is intended as an aid in the diagnosis of influenza from Nasopharyngeal swab specimens and should not be used as a sole basis for treatment. Nasal washings and aspirates are unacceptable for Xpert Xpress SARS-CoV-2/FLU/RSV testing.  Fact Sheet for  Patients: EntrepreneurPulse.com.au  Fact Sheet for Healthcare Providers: IncredibleEmployment.be  This test is not yet approved or cleared by the [place] FDA and has been authorized for detection and/or diagnosis of SARS-CoV-2 by FDA under an Emergency Use Authorization (EUA). This EUA will remain in effect (meaning this test can be used) for the duration of the COVID-19 declaration under Section 564(b)(1) of the Act, 21 U.S.C. section 360bbb-3(b)(1), unless the authorization is terminated or revoked.  Performed at [facility], [place] [address] [place], [place] [address]   Sample to Blood Bank     Status: None   Collection Time: 05/13/21  9:11 AM  Result Value Ref Range   Blood Bank Specimen SAMPLE AVAILABLE FOR TESTING    Sample Expiration      05/16/2021,2359 Performed at [facility], [place] [address] [place], [place] [address]   CMP ([place] only)     Status: Abnormal   Collection Time: 05/13/21  9:11 AM  Result Value Ref Range   Sodium 141 135 - 145 mmol/L   Potassium 3.9 3.5 - 5.1 mmol/L   Chloride 106 98 - 111 mmol/L   CO2 23 22 - 32 mmol/L   Glucose, Bld 100 (H) 70 - 99 mg/dL    Comment: Glucose reference range applies only to samples taken after fasting for at least 8 hours.   BUN 22 8 - 23 mg/dL   Creatinine 1.48 (H) 0.44 - 1.00 mg/dL   Calcium 9.2 8.9 - 10.3 mg/dL   Total Protein 7.3 6.5 - 8.1 g/dL   Albumin 3.7 3.5 - 5.0 g/dL   AST 17 15 - 41 U/L   ALT 11 0 - 44 U/L  Alkaline Phosphatase 93 38 - 126 U/L   Total Bilirubin 0.2 (L) 0.3 - 1.2 mg/dL   GFR, Estimated 40 (L) >60 mL/min    Comment: (NOTE) Calculated using the CKD-EPI Creatinine Equation (2021)    Anion gap 12 5 - 15    Comment: Performed at [facility], [place] [address], [place], [place] [address]  CBC with Differential ([place] Only)     Status: Abnormal   Collection Time: 05/13/21   9:11 AM  Result Value Ref Range   WBC Count 3.6 (L) 4.0 - 10.5 K/uL   RBC 2.80 (L) 3.87 - 5.11 MIL/uL   Hemoglobin 9.2 (L) 12.0 - 15.0 g/dL   HCT 28.3 (L) 36.0 - 46.0 %   MCV 101.1 (H) 80.0 - 100.0 fL   MCH 32.9 26.0 - 34.0 pg   MCHC 32.5 30.0 - 36.0 g/dL   RDW 16.4 (H) 11.5 - 15.5 %   Platelet Count 100 (L) 150 - 400 K/uL   nRBC 0.0 0.0 - 0.2 %   Neutrophils Relative % 38 %   Neutro Abs 1.4 (L) 1.7 - 7.7 K/uL   Lymphocytes Relative 44 %   Lymphs Abs 1.6 0.7 - 4.0 K/uL   Monocytes Relative 15 %   Monocytes Absolute 0.5 0.1 - 1.0 K/uL   Eosinophils Relative 2 %   Eosinophils Absolute 0.1 0.0 - 0.5 K/uL   Basophils Relative 0 %   Basophils Absolute 0.0 0.0 - 0.1 K/uL   Immature Granulocytes 1 %   Abs Immature Granulocytes 0.03 0.00 - 0.07 K/uL    Comment: Performed at [facility], [place] [address], [place], [place] [address]  I-STAT, [place] Clap 8     Status: Abnormal   Collection Time: 05/20/21  8:26 AM  Result Value Ref Range   Sodium 141 135 - 145 mmol/L   Potassium 4.1 3.5 - 5.1 mmol/L   Chloride 104 98 - 111 mmol/L   BUN 22 8 - 23 mg/dL   Creatinine, Ser 1.40 (H) 0.44 - 1.00 mg/dL   Glucose, Bld 98 70 - 99 mg/dL    Comment: Glucose reference range applies only to samples taken after fasting for at least 8 hours.   Calcium, Ion 1.26 1.15 - 1.40 mmol/L   TCO2 24 22 - 32 mmol/L   Hemoglobin 9.2 (L) 12.0 - 15.0 g/dL   HCT 27.0 (L) 36.0 - 46.0 %  CBC with Differential/Platelet     Status: Abnormal   Collection Time: 05/22/21 12:00 AM  Result Value Ref Range   WBC 6.3 3.8 - 10.8 Thousand/uL   RBC 2.74 (L) 3.80 - 5.10 Million/uL   Hemoglobin 9.0 (L) 11.7 - 15.5 g/dL   HCT 27.2 (L) 35.0 - 45.0 %   MCV 99.3 80.0 - 100.0 fL   MCH 32.8 27.0 - 33.0 pg   MCHC 33.1 32.0 - 36.0 g/dL   RDW 16.7 (H) 11.0 - 15.0 %   Platelets 149 140 - 400 Thousand/uL   MPV 10.9 7.5 - 12.5 fL   Neutro Abs 3,994 1,500 - 7,800 cells/uL   Lymphs Abs 1,380 850 - 3,900 cells/uL    Absolute Monocytes 907 200 - 950 cells/uL   Eosinophils Absolute 0 (L) 15 - 500 cells/uL   Basophils Absolute 19 0 - 200 cells/uL   Neutrophils Relative % 63.4 %   Total Lymphocyte 21.9 %   Monocytes Relative 14.4 %   Eosinophils Relative 0.0 %   Basophils  Relative 0.3 %  COMPLETE METABOLIC PANEL WITH GFR     Status: Abnormal   Collection Time: 05/22/21 12:00 AM  Result Value Ref Range   Glucose, Bld 108 (H) 65 - 99 mg/dL    Comment: .            Fasting reference interval . For someone without known diabetes, a glucose value between 100 and 125 mg/dL is consistent with prediabetes and should be confirmed with a follow-up test. .    BUN 29 (H) 7 - 25 mg/dL   Creat 1.84 (H) 0.50 - 0.99 mg/dL    Comment: For patients >84 years of age, the reference limit for Creatinine is approximately 13% higher for people identified as African-American. .    GFR, Est Non African American 29 (L) > OR = 60 mL/min/1.93m2   GFR, Est African American 33 (L) > OR = 60 mL/min/1.9m2   BUN/Creatinine Ratio 16 6 - 22 (calc)   Sodium 134 (L) 135 - 146 mmol/L   Potassium 4.0 3.5 - 5.3 mmol/L   Chloride 101 98 - 110 mmol/L   CO2 23 20 - 32 mmol/L   Calcium 8.1 (L) 8.6 - 10.4 mg/dL   Total Protein 6.5 6.1 - 8.1 g/dL   Albumin 3.7 3.6 - 5.1 g/dL   Globulin 2.8 1.9 - 3.7 g/dL (calc)   AG Ratio 1.3 1.0 - 2.5 (calc)   Total Bilirubin 0.3 0.2 - 1.2 mg/dL   Alkaline phosphatase (APISO) 89 37 - 153 U/L   AST 15 10 - 35 U/L   ALT 9 6 - 29 U/L  Magnesium     Status: Abnormal   Collection Time: 05/22/21 12:00 AM  Result Value Ref Range   Magnesium 1.1 (L) 1.5 - 2.5 mg/dL  Urine Culture     Status: Abnormal   Collection Time: 05/22/21 12:00 AM   Specimen: Urine  Result Value Ref Range   MICRO NUMBER: [id number]    SPECIMEN QUALITY: Adequate    Sample Source URINE    STATUS: FINAL    ISOLATE 1: Escherichia coli (A)     Comment: Greater than 100,000 CFU/mL of Escherichia coli      Susceptibility    Escherichia coli - URINE CULTURE, REFLEX    AMOX/CLAVULANIC 8 Sensitive     AMPICILLIN >=32 Resistant     AMPICILLIN/SULBACTAM 16 Intermediate     CEFAZOLIN* <=4 Not Reportable      * For infections other than uncomplicated UTI caused by E. coli, K. pneumoniae or P. mirabilis: Cefazolin is resistant if MIC > or = 8 mcg/mL. (Distinguishing susceptible versus intermediate for isolates with MIC < or = 4 mcg/mL requires additional testing.) For uncomplicated UTI caused by E. coli, K. pneumoniae or P. mirabilis: Cefazolin is susceptible if MIC <32 mcg/mL and predicts susceptible to the oral agents cefaclor, cefdinir, cefpodoxime, cefprozil, cefuroxime, cephalexin and loracarbef.     CEFEPIME <=1 Sensitive     CEFTRIAXONE <=1 Sensitive     CIPROFLOXACIN >=4 Resistant     LEVOFLOXACIN >=8 Resistant     ERTAPENEM <=0.5 Sensitive     GENTAMICIN <=1 Sensitive     IMIPENEM <=0.25 Sensitive     NITROFURANTOIN <=16 Sensitive     PIP/TAZO <=4 Sensitive     TOBRAMYCIN <=1 Sensitive     TRIMETH/SULFA* <=20 Sensitive      * For infections other than uncomplicated UTI caused by E. coli, K. pneumoniae or P. mirabilis: Cefazolin is resistant if MIC >  or = 8 mcg/mL. (Distinguishing susceptible versus intermediate for isolates with MIC < or = 4 mcg/mL requires additional testing.) For uncomplicated UTI caused by E. coli, K. pneumoniae or P. mirabilis: Cefazolin is susceptible if MIC <32 mcg/mL and predicts susceptible to the oral agents cefaclor, cefdinir, cefpodoxime, cefprozil, cefuroxime, cephalexin and loracarbef. Legend: S = Susceptible  I = Intermediate R = Resistant  NS = Not susceptible * = Not tested  NR = Not reported **NN = See antimicrobic comments   Urinalysis     Status: Abnormal   Collection Time: 05/22/21 12:00 AM  Result Value Ref Range   Color, Urine DARK YELLOW YELLOW   APPearance TURBID (A) CLEAR   Specific Gravity, Urine 1.018 1.001 - 1.035   pH 6.0 5.0 - 8.0    Glucose, UA NEGATIVE NEGATIVE   Bilirubin Urine NEGATIVE NEGATIVE   Ketones, ur NEGATIVE NEGATIVE   Hgb urine dipstick 1+ (A) NEGATIVE   Protein, ur 3+ (A) NEGATIVE   Nitrite POSITIVE (A) NEGATIVE   Leukocytes,Ua 3+ (A) NEGATIVE  Microalbumin / creatinine urine ratio     Status: Abnormal   Collection Time: 05/22/21 12:00 AM  Result Value Ref Range   Creatinine, Urine 127 20 - 275 mg/dL   Microalb, Ur 121.9 mg/dL    Comment: Verified by repeat analysis. [place] Kitchen Reference Range Not established    Microalb Creat Ratio 960 (H) <30 mcg/mg creat    Comment: . The ADA defines abnormalities in albumin excretion as follows: [place] Kitchen Albuminuria Category        Result (mcg/mg creatinine) . Normal to Mildly increased   <30 Moderately increased         30-299  Severely increased           > OR = 300 . The ADA recommends that at least two of three specimens collected within a 3-6 month period be abnormal before considering a patient to be within a diagnostic category.   Comprehensive metabolic panel     Status: Abnormal   Collection Time: 05/23/21  1:00 AM  Result Value Ref Range   Sodium 130 (L) 135 - 145 mmol/L   Potassium 3.3 (L) 3.5 - 5.1 mmol/L   Chloride 100 98 - 111 mmol/L   CO2 19 (L) 22 - 32 mmol/L   Glucose, Bld 103 (H) 70 - 99 mg/dL    Comment: Glucose reference range applies only to samples taken after fasting for at least 8 hours.   BUN 27 (H) 8 - 23 mg/dL   Creatinine, Ser 2.07 (H) 0.44 - 1.00 mg/dL   Calcium 7.9 (L) 8.9 - 10.3 mg/dL   Total Protein 6.9 6.5 - 8.1 g/dL   Albumin 3.4 (L) 3.5 - 5.0 g/dL   AST 24 15 - 41 U/L   ALT 17 0 - 44 U/L   Alkaline Phosphatase 74 38 - 126 U/L   Total Bilirubin 0.7 0.3 - 1.2 mg/dL   GFR, Estimated 26 (L) >60 mL/min    Comment: (NOTE) Calculated using the CKD-EPI Creatinine Equation (2021)    Anion gap 11 5 - 15    Comment: Performed at [facility], [place] [address] [place], [place] [address]  Lactic acid, plasma      Status: None   Collection Time: 05/23/21  1:00 AM  Result Value Ref Range   Lactic Acid, Venous 1.9 0.5 - 1.9 mmol/L    Comment: Performed at [facility], [place] [address], [place], [place] [address]  CBC with Differential     Status: Abnormal   Collection Time: 05/23/21  1:00 AM  Result Value Ref Range   WBC 5.8 4.0 - 10.5 K/uL   RBC 2.85 (L) 3.87 - 5.11 MIL/uL   Hemoglobin 9.4 (L) 12.0 - 15.0 g/dL   HCT 29.1 (L) 36.0 - 46.0 %   MCV 102.1 (H) 80.0 - 100.0 fL   MCH 33.0 26.0 - 34.0 pg   MCHC 32.3 30.0 - 36.0 g/dL   RDW 17.2 (H) 11.5 - 15.5 %   Platelets 125 (L) 150 - 400 K/uL   nRBC 1.0 (H) 0.0 - 0.2 %   Neutrophils Relative % 58 %   Neutro Abs 3.3 1.7 - 7.7 K/uL   Lymphocytes Relative 19 %   Lymphs Abs 1.1 0.7 - 4.0 K/uL   Monocytes Relative 21 %   Monocytes Absolute 1.2 (H) 0.1 - 1.0 K/uL   Eosinophils Relative 0 %   Eosinophils Absolute 0.0 0.0 - 0.5 K/uL   Basophils Relative 0 %   Basophils Absolute 0.0 0.0 - 0.1 K/uL   Immature Granulocytes 2 %   Abs Immature Granulocytes 0.09 (H) 0.00 - 0.07 K/uL    Comment: Performed at [facility], [place] [address] [place], [place] [address]  Protime-INR     Status: None   Collection Time: 05/23/21  1:00 AM  Result Value Ref Range   Prothrombin Time 14.5 11.4 - 15.2 seconds   INR 1.1 0.8 - 1.2    Comment: (NOTE) INR goal varies based on device and disease states. Performed at [facility], [place] [address] [place], [place] [address]   Culture, blood (Routine x 2)     Status: Abnormal   Collection Time: 05/23/21  1:00 AM   Specimen: BLOOD  Result Value Ref Range   Specimen Description      BLOOD LEFT ANTECUBITAL Performed at [place] [address] [place], [place] [address]    Special Requests      BOTTLES DRAWN AEROBIC AND ANAEROBIC Blood Culture adequate volume Performed at [place] [address], [place], [place]  [address]    Culture  Setup Time      GRAM NEGATIVE RODS IN BOTH AEROBIC AND ANAEROBIC BOTTLES CRITICAL VALUE NOTED.  VALUE IS CONSISTENT WITH PREVIOUSLY REPORTED AND CALLED VALUE.    Culture (A)     ESCHERICHIA COLI SUSCEPTIBILITIES PERFORMED ON PREVIOUS CULTURE WITHIN THE LAST 5 DAYS. Performed at [facility], [place] [address] [place], [place] [address]    Report Status 05/26/2021 FINAL   Resp Panel by RT-PCR (Flu A&B, Covid) Nasopharyngeal Swab     Status: None   Collection Time: 05/23/21  1:00 AM   Specimen: Nasopharyngeal Swab; Nasopharyngeal(NP) swabs in vial transport medium  Result Value Ref Range   SARS Coronavirus 2 by RT PCR NEGATIVE NEGATIVE    Comment: (NOTE) SARS-CoV-2 target nucleic acids are NOT DETECTED.  The SARS-CoV-2 RNA is generally detectable in upper respiratory specimens during the acute phase of infection. The lowest concentration of SARS-CoV-2 viral copies this assay can detect is 138 copies/mL. A negative result does not preclude SARS-Cov-2 infection and should not be used as the sole basis for treatment or other patient management decisions. A negative result may occur with  improper specimen collection/handling, submission of specimen other than nasopharyngeal swab, presence of viral mutation(s) within the areas targeted by this assay, and inadequate number of viral copies(<138 copies/mL).  A negative result must be combined with clinical observations, patient history, and epidemiological information. The expected result is Negative.  Fact Sheet for Patients:  EntrepreneurPulse.com.au  Fact Sheet for Healthcare Providers:  IncredibleEmployment.be  This test is no t yet approved or cleared by the [place] FDA and  has been authorized for detection and/or diagnosis of SARS-CoV-2 by FDA under an Emergency Use Authorization (EUA). This EUA will remain  in effect (meaning this test can be used) for the  duration of the COVID-19 declaration under Section 564(b)(1) of the Act, 21 U.S.C.section 360bbb-3(b)(1), unless the authorization is terminated  or revoked sooner.       Influenza A by PCR NEGATIVE NEGATIVE   Influenza B by PCR NEGATIVE NEGATIVE    Comment: (NOTE) The Xpert Xpress SARS-CoV-2/FLU/RSV plus assay is intended as an aid in the diagnosis of influenza from Nasopharyngeal swab specimens and should not be used as a sole basis for treatment. Nasal washings and aspirates are unacceptable for Xpert Xpress SARS-CoV-2/FLU/RSV testing.  Fact Sheet for Patients: EntrepreneurPulse.com.au  Fact Sheet for Healthcare Providers: IncredibleEmployment.be  This test is not yet approved or cleared by the [place] FDA and has been authorized for detection and/or diagnosis of SARS-CoV-2 by FDA under an Emergency Use Authorization (EUA). This EUA will remain in effect (meaning this test can be used) for the duration of the COVID-19 declaration under Section 564(b)(1) of the Act, 21 U.S.C. section 360bbb-3(b)(1), unless the authorization is terminated or revoked.  Performed at [facility], [place] [address] [place], [place] [address]   APTT     Status: None   Collection Time: 05/23/21  1:00 AM  Result Value Ref Range   aPTT 34 24 - 36 seconds    Comment: Performed at [facility], [place] [address], [place], [place] [address]  Lipase, blood     Status: None   Collection Time: 05/23/21  1:00 AM  Result Value Ref Range   Lipase 19 11 - 51 U/L    Comment: Performed at [facility], [place] [address], [place], [place] [address]  Culture, blood (Routine x 2)     Status: Abnormal   Collection Time: 05/23/21  1:04 AM   Specimen: BLOOD LEFT HAND  Result Value Ref Range   Specimen Description      BLOOD LEFT HAND Performed at [place] [address], [place], [place]  [address]    Special Requests      BOTTLES DRAWN AEROBIC AND ANAEROBIC Blood Culture adequate volume Performed at [place] [address], [place], [place] [address]    Culture  Setup Time      GRAM NEGATIVE RODS IN BOTH AEROBIC AND ANAEROBIC BOTTLES CRITICAL RESULT CALLED TO, READ BACK BY AND VERIFIED WITH: TERRI GREEN PHARMD @1918  05/23/21 EB Performed at [facility], [place] [address], [place],  [address]    Culture ESCHERICHIA COLI (A)    Report Status 05/25/2021 FINAL    Organism ID, Bacteria ESCHERICHIA COLI       Susceptibility   Escherichia coli - MIC*    AMPICILLIN >=32 RESISTANT Resistant     CEFAZOLIN <=4 SENSITIVE Sensitive     CEFEPIME <=0.12 SENSITIVE Sensitive     CEFTAZIDIME <=1 SENSITIVE Sensitive     CEFTRIAXONE <=0.25 SENSITIVE Sensitive     CIPROFLOXACIN >=4 RESISTANT Resistant     GENTAMICIN <=1 SENSITIVE Sensitive     IMIPENEM <=0.25 SENSITIVE Sensitive  TRIMETH/SULFA <=20 SENSITIVE Sensitive     AMPICILLIN/SULBACTAM 16 INTERMEDIATE Intermediate     PIP/TAZO <=4 SENSITIVE Sensitive     * ESCHERICHIA COLI  Blood Culture ID Panel (Reflexed)     Status: Abnormal   Collection Time: 05/23/21  1:04 AM  Result Value Ref Range   Enterococcus faecalis NOT DETECTED NOT DETECTED   Enterococcus Faecium NOT DETECTED NOT DETECTED   Listeria monocytogenes NOT DETECTED NOT DETECTED   Staphylococcus species NOT DETECTED NOT DETECTED   Staphylococcus aureus (BCID) NOT DETECTED NOT DETECTED   Staphylococcus epidermidis NOT DETECTED NOT DETECTED   Staphylococcus lugdunensis NOT DETECTED NOT DETECTED   Streptococcus species NOT DETECTED NOT DETECTED   Streptococcus agalactiae NOT DETECTED NOT DETECTED   Streptococcus pneumoniae NOT DETECTED NOT DETECTED   Streptococcus pyogenes NOT DETECTED NOT DETECTED   A.calcoaceticus-baumannii NOT DETECTED NOT DETECTED   Bacteroides fragilis NOT DETECTED NOT DETECTED   Enterobacterales DETECTED (A)  NOT DETECTED    Comment: Enterobacterales represent a large order of gram negative bacteria, not a single organism. CRITICAL RESULT CALLED TO, READ BACK BY AND VERIFIED WITH: TERRI GREEN PHARMD @1918  05/23/21 EB    Enterobacter cloacae complex NOT DETECTED NOT DETECTED   Escherichia coli DETECTED (A) NOT DETECTED    Comment: CRITICAL RESULT CALLED TO, READ BACK BY AND VERIFIED WITH: TERRI GREEN PHARMD @1918  05/23/21 EB    Klebsiella aerogenes NOT DETECTED NOT DETECTED   Klebsiella oxytoca NOT DETECTED NOT DETECTED   Klebsiella pneumoniae NOT DETECTED NOT DETECTED   Proteus species NOT DETECTED NOT DETECTED   Salmonella species NOT DETECTED NOT DETECTED   Serratia marcescens NOT DETECTED NOT DETECTED   Haemophilus influenzae NOT DETECTED NOT DETECTED   Neisseria meningitidis NOT DETECTED NOT DETECTED   Pseudomonas aeruginosa NOT DETECTED NOT DETECTED   Stenotrophomonas maltophilia NOT DETECTED NOT DETECTED   Candida albicans NOT DETECTED NOT DETECTED   Candida auris NOT DETECTED NOT DETECTED   Candida glabrata NOT DETECTED NOT DETECTED   Candida krusei NOT DETECTED NOT DETECTED   Candida parapsilosis NOT DETECTED NOT DETECTED   Candida tropicalis NOT DETECTED NOT DETECTED   Cryptococcus neoformans/gattii NOT DETECTED NOT DETECTED   CTX-M ESBL NOT DETECTED NOT DETECTED   Carbapenem resistance IMP NOT DETECTED NOT DETECTED   Carbapenem resistance KPC NOT DETECTED NOT DETECTED   Carbapenem resistance NDM NOT DETECTED NOT DETECTED   Carbapenem resist OXA 48 LIKE NOT DETECTED NOT DETECTED   Carbapenem resistance VIM NOT DETECTED NOT DETECTED    Comment: Performed at [facility], [address], [place], [place] [address]  Urinalysis, Routine w reflex microscopic Urine, Clean Catch     Status: Abnormal   Collection Time: 05/23/21  2:37 AM  Result Value Ref Range   Color, Urine AMBER (A) YELLOW    Comment: BIOCHEMICALS MAY BE AFFECTED BY COLOR   APPearance HAZY (A) CLEAR    Specific Gravity, Urine 1.009 1.005 - 1.030   pH 6.0 5.0 - 8.0   Glucose, UA NEGATIVE NEGATIVE mg/dL   Hgb urine dipstick SMALL (A) NEGATIVE   Bilirubin Urine NEGATIVE NEGATIVE   Ketones, ur NEGATIVE NEGATIVE mg/dL   Protein, ur 100 (A) NEGATIVE mg/dL   Nitrite POSITIVE (A) NEGATIVE   Leukocytes,Ua LARGE (A) NEGATIVE   RBC / HPF 11-20 0 - 5 RBC/hpf   WBC, UA >50 (H) 0 - 5 WBC/hpf   Bacteria, UA FEW (A) NONE SEEN   Mucus PRESENT    Hyaline Casts, UA PRESENT  Comment: Performed at [facility], [place] [address] [place], [place] [address]  Urine culture     Status: Abnormal   Collection Time: 05/23/21  2:37 AM   Specimen: In/Out Cath Urine  Result Value Ref Range   Specimen Description      IN/OUT CATH URINE Performed at [facility], [place] [address] [place], [place] [address]    Special Requests      NONE Performed at [facility], [place] [address], [place], [place] [address]    Culture 20,000 COLONIES/mL ESCHERICHIA COLI (A)    Report Status 05/25/2021 FINAL    Organism ID, Bacteria ESCHERICHIA COLI (A)       Susceptibility   Escherichia coli - MIC*    AMPICILLIN >=32 RESISTANT Resistant     CEFAZOLIN <=4 SENSITIVE Sensitive     CEFEPIME <=0.12 SENSITIVE Sensitive     CEFTRIAXONE <=0.25 SENSITIVE Sensitive     CIPROFLOXACIN >=4 RESISTANT Resistant     GENTAMICIN <=1 SENSITIVE Sensitive     IMIPENEM <=0.25 SENSITIVE Sensitive     NITROFURANTOIN <=16 SENSITIVE Sensitive     TRIMETH/SULFA <=20 SENSITIVE Sensitive     AMPICILLIN/SULBACTAM >=32 RESISTANT Resistant     PIP/TAZO <=4 SENSITIVE Sensitive     * 20,000 COLONIES/mL ESCHERICHIA COLI  Magnesium     Status: Abnormal   Collection Time: 05/23/21 10:45 AM  Result Value Ref Range   Magnesium 0.5 (LL) 1.7 - 2.4 mg/dL    Comment: CRITICAL RESULT CALLED TO, READ BACK BY AND VERIFIED WITH: WOODY,A. RN AT 1108 05/23/21 MULLINS,T Performed at [facility], [place] [address] [place], [place] [address]   Renal function panel     Status: Abnormal   Collection Time: 05/23/21  5:15 PM  Result Value Ref Range   Sodium 133 (L) 135 - 145 mmol/L   Potassium 3.4 (L) 3.5 - 5.1 mmol/L   Chloride 106 98 - 111 mmol/L   CO2 15 (L) 22 - 32 mmol/L   Glucose, Bld 112 (H) 70 - 99 mg/dL    Comment: Glucose reference range applies only to samples taken after fasting for at least 8 hours.   BUN 22 8 - 23 mg/dL   Creatinine, Ser 1.74 (H) 0.44 - 1.00 mg/dL   Calcium 7.8 (L) 8.9 - 10.3 mg/dL   Phosphorus 2.7 2.5 - 4.6 mg/dL   Albumin 3.2 (L) 3.5 - 5.0 g/dL   GFR, Estimated 33 (L) >60 mL/min    Comment: (NOTE) Calculated using the CKD-EPI Creatinine Equation (2021)    Anion gap 12 5 - 15    Comment: Performed at [facility], [place] [address], [place], [place] [address]  Magnesium     Status: Abnormal   Collection Time: 05/23/21  5:15 PM  Result Value Ref Range   Magnesium 1.5 (L) 1.7 - 2.4 mg/dL    Comment: Performed at [facility], [place] [address], [place], [place] [address]  Protime-INR     Status: Abnormal   Collection Time: 05/24/21  5:51 AM  Result Value Ref Range   Prothrombin Time 16.6 (H) 11.4 - 15.2 seconds   INR 1.3 (H) 0.8 - 1.2    Comment: (NOTE) INR goal varies based on device and disease states. Performed at [facility], [place] [address], [place],  [address]   Cortisol-am, blood     Status: None   Collection Time: 05/24/21  5:51 AM  Result Value  Ref Range   Cortisol - AM 21.2 6.7 - 22.6 ug/dL    Comment: Performed at [facility], [place] [address] [place], [place] [address]  Procalcitonin     Status: None   Collection Time: 05/24/21  5:51 AM  Result Value Ref Range   Procalcitonin 12.12 ng/mL    Comment:        Interpretation: PCT >= 10 ng/mL: Important systemic inflammatory response, almost exclusively due to severe bacterial sepsis or septic  shock. (NOTE)       Sepsis PCT Algorithm           Lower Respiratory Tract                                      Infection PCT Algorithm    ----------------------------     ----------------------------         PCT < 0.25 ng/mL                PCT < 0.10 ng/mL          Strongly encourage             Strongly discourage   discontinuation of antibiotics    initiation of antibiotics    ----------------------------     -----------------------------       PCT 0.25 - 0.50 ng/mL            PCT 0.10 - 0.25 ng/mL               OR       >80% decrease in PCT            Discourage initiation of                                            antibiotics      Encourage discontinuation           of antibiotics    ----------------------------     -----------------------------         PCT >= 0.50 ng/mL              PCT 0.26 - 0.50 ng/mL                AND       <80% decrease in PCT             Encourage initiation of                                             antibiotics       Encourage continuation           of antibiotics    ----------------------------     -----------------------------        PCT >= 0.50 ng/mL                  PCT > 0.50 ng/mL               AND         increase in PCT                  Strongly encourage  initiation of antibiotics    Strongly encourage escalation           of antibiotics                                     -----------------------------                                           PCT <= 0.25 ng/mL                                                 OR                                        > 80% decrease in PCT                                      Discontinue / Do not initiate                                             antibiotics  Performed at [place] [address] [place], [place] [address]   Comprehensive metabolic panel     Status: Abnormal   Collection Time: 05/24/21  5:51 AM  Result Value Ref Range    Sodium 135 135 - 145 mmol/L   Potassium 3.4 (L) 3.5 - 5.1 mmol/L   Chloride 107 98 - 111 mmol/L   CO2 20 (L) 22 - 32 mmol/L   Glucose, Bld 108 (H) 70 - 99 mg/dL    Comment: Glucose reference range applies only to samples taken after fasting for at least 8 hours.   BUN 15 8 - 23 mg/dL   Creatinine, Ser 1.61 (H) 0.44 - 1.00 mg/dL   Calcium 8.1 (L) 8.9 - 10.3 mg/dL   Total Protein 6.8 6.5 - 8.1 g/dL   Albumin 3.1 (L) 3.5 - 5.0 g/dL   AST 44 (H) 15 - 41 U/L   ALT 22 0 - 44 U/L   Alkaline Phosphatase 72 38 - 126 U/L   Total Bilirubin 0.4 0.3 - 1.2 mg/dL   GFR, Estimated 36 (L) >60 mL/min    Comment: (NOTE) Calculated using the CKD-EPI Creatinine Equation (2021)    Anion gap 8 5 - 15    Comment: Performed at [facility], [place] [address] [place], [place] [address]  CBC     Status: Abnormal   Collection Time: 05/24/21  5:51 AM  Result Value Ref Range   WBC 2.9 (L) 4.0 - 10.5 K/uL   RBC 2.61 (L) 3.87 - 5.11 MIL/uL   Hemoglobin 8.7 (L) 12.0 - 15.0 g/dL   HCT 26.9 (L) 36.0 - 46.0 %   MCV 103.1 (H) 80.0 - 100.0 fL   MCH 33.3 26.0 - 34.0 pg   MCHC 32.3 30.0 - 36.0 g/dL   RDW 17.2 (H) 11.5 -  15.5 %   Platelets 102 (L) 150 - 400 K/uL    Comment: Immature Platelet Fraction may be clinically indicated, consider ordering this additional test [id number]    nRBC 1.7 (H) 0.0 - 0.2 %    Comment: Performed at [facility], [place] [address] [place], [place] [address]  Magnesium     Status: Abnormal   Collection Time: 05/24/21  5:51 AM  Result Value Ref Range   Magnesium 1.3 (L) 1.7 - 2.4 mg/dL    Comment: Performed at [facility], [place] [address], [place], [place] [address]  C Difficile Quick Screen w PCR reflex     Status: None   Collection Time: 05/24/21 11:16 AM   Specimen: STOOL  Result Value Ref Range   C Diff antigen NEGATIVE NEGATIVE   C Diff toxin NEGATIVE NEGATIVE   C Diff interpretation No C. difficile detected.      Comment: NEGATIVE Performed at [facility], [place] [address], [place], [place] [address]   CBC with Differential/Platelet     Status: Abnormal   Collection Time: 05/25/21  5:06 AM  Result Value Ref Range   WBC 3.7 (L) 4.0 - 10.5 K/uL   RBC 2.59 (L) 3.87 - 5.11 MIL/uL   Hemoglobin 8.5 (L) 12.0 - 15.0 g/dL   HCT 26.3 (L) 36.0 - 46.0 %   MCV 101.5 (H) 80.0 - 100.0 fL   MCH 32.8 26.0 - 34.0 pg   MCHC 32.3 30.0 - 36.0 g/dL   RDW 17.2 (H) 11.5 - 15.5 %   Platelets 98 (L) 150 - 400 K/uL    Comment: Immature Platelet Fraction may be clinically indicated, consider ordering this additional test [id number] CONSISTENT WITH PREVIOUS RESULT    nRBC 1.1 (H) 0.0 - 0.2 %   Neutrophils Relative % 36 %   Neutro Abs 1.3 (L) 1.7 - 7.7 K/uL   Lymphocytes Relative 25 %   Lymphs Abs 0.9 0.7 - 4.0 K/uL   Monocytes Relative 36 %   Monocytes Absolute 1.3 (H) 0.1 - 1.0 K/uL   Eosinophils Relative 0 %   Eosinophils Absolute 0.0 0.0 - 0.5 K/uL   Basophils Relative 0 %   Basophils Absolute 0.0 0.0 - 0.1 K/uL   WBC Morphology DOHLE BODIES     Comment: ATYPICAL MONONUCLEAR CELLS MILD LEFT SHIFT (1-5% METAS, OCC MYELO, OCC BANDS) TOXIC GRANULATION    Immature Granulocytes 3 %   Abs Immature Granulocytes 0.12 (H) 0.00 - 0.07 K/uL    Comment: Performed at [facility], [place] [address] [place], [place] [address]  Comprehensive metabolic panel     Status: Abnormal   Collection Time: 05/25/21  5:06 AM  Result Value Ref Range   Sodium 133 (L) 135 - 145 mmol/L   Potassium 3.2 (L) 3.5 - 5.1 mmol/L   Chloride 104 98 - 111 mmol/L   CO2 17 (L) 22 - 32 mmol/L   Glucose, Bld 107 (H) 70 - 99 mg/dL    Comment: Glucose reference range applies only to samples taken after fasting for at least 8 hours.   BUN 16 8 - 23 mg/dL   Creatinine, Ser 1.77 (H) 0.44 - 1.00 mg/dL   Calcium 8.2 (L) 8.9 - 10.3 mg/dL   Total Protein 7.2 6.5 - 8.1 g/dL   Albumin 3.2 (L) 3.5 - 5.0 g/dL   AST 37 15  - 41 U/L   ALT 23 0 - 44 U/L   Alkaline Phosphatase  72 38 - 126 U/L   Total Bilirubin 0.5 0.3 - 1.2 mg/dL   GFR, Estimated 32 (L) >60 mL/min    Comment: (NOTE) Calculated using the CKD-EPI Creatinine Equation (2021)    Anion gap 12 5 - 15    Comment: Performed at [facility], [place] [address] [place], [place] [address]  Magnesium     Status: None   Collection Time: 05/25/21  5:06 AM  Result Value Ref Range   Magnesium 2.0 1.7 - 2.4 mg/dL    Comment: Performed at [facility], [place] [address] [place], [place] [address]  Gastrointestinal Panel by PCR , Stool     Status: None   Collection Time: 05/25/21  2:35 PM   Specimen: Stool  Result Value Ref Range   Campylobacter species NOT DETECTED NOT DETECTED   Plesimonas shigelloides NOT DETECTED NOT DETECTED   Salmonella species NOT DETECTED NOT DETECTED   Yersinia enterocolitica NOT DETECTED NOT DETECTED   Vibrio species NOT DETECTED NOT DETECTED   Vibrio cholerae NOT DETECTED NOT DETECTED   Enteroaggregative E coli (EAEC) NOT DETECTED NOT DETECTED   Enteropathogenic E coli (EPEC) NOT DETECTED NOT DETECTED   Enterotoxigenic E coli (ETEC) NOT DETECTED NOT DETECTED   Shiga like toxin producing E coli (STEC) NOT DETECTED NOT DETECTED   Shigella/Enteroinvasive E coli (EIEC) NOT DETECTED NOT DETECTED   Cryptosporidium NOT DETECTED NOT DETECTED   Cyclospora cayetanensis NOT DETECTED NOT DETECTED   Entamoeba histolytica NOT DETECTED NOT DETECTED   Giardia lamblia NOT DETECTED NOT DETECTED   Adenovirus F40/41 NOT DETECTED NOT DETECTED   Astrovirus NOT DETECTED NOT DETECTED   Norovirus GI/GII NOT DETECTED NOT DETECTED   Rotavirus A NOT DETECTED NOT DETECTED   Sapovirus (I, II, IV, and V) NOT DETECTED NOT DETECTED    Comment: Performed at [facility], [place] [place], [place] [address]  CBC with Differential/Platelet     Status: Abnormal   Collection Time: 05/25/21  9:00 PM  Result  Value Ref Range   WBC 5.2 4.0 - 10.5 K/uL   RBC 2.38 (L) 3.87 - 5.11 MIL/uL   Hemoglobin 7.7 (L) 12.0 - 15.0 g/dL   HCT 23.9 (L) 36.0 - 46.0 %   MCV 100.4 (H) 80.0 - 100.0 fL   MCH 32.4 26.0 - 34.0 pg   MCHC 32.2 30.0 - 36.0 g/dL   RDW 17.2 (H) 11.5 - 15.5 %   Platelets 114 (L) 150 - 400 K/uL    Comment: Immature Platelet Fraction may be clinically indicated, consider ordering this additional test [id number] CONSISTENT WITH PREVIOUS RESULT    nRBC 1.2 (H) 0.0 - 0.2 %   Neutrophils Relative % 25 %   Neutro Abs 1.3 (L) 1.7 - 7.7 K/uL   Lymphocytes Relative 26 %   Lymphs Abs 1.3 0.7 - 4.0 K/uL   Monocytes Relative 43 %   Monocytes Absolute 2.2 (H) 0.1 - 1.0 K/uL   Eosinophils Relative 0 %   Eosinophils Absolute 0.0 0.0 - 0.5 K/uL   Basophils Relative 0 %   Basophils Absolute 0.0 0.0 - 0.1 K/uL   WBC Morphology      MODERATE LEFT SHIFT (>5% METAS AND MYELOS,OCC PRO NOTED)    Comment: TOXIC GRANULATION   Immature Granulocytes 6 %   Abs Immature Granulocytes 0.33 (H) 0.00 - 0.07 K/uL    Comment: Performed at [facility], [place] [address], [place], [place] [address]  Basic metabolic panel  Status: Abnormal   Collection Time: 05/26/21  4:00 AM  Result Value Ref Range   Sodium 134 (L) 135 - 145 mmol/L   Potassium 3.0 (L) 3.5 - 5.1 mmol/L   Chloride 108 98 - 111 mmol/L   CO2 20 (L) 22 - 32 mmol/L   Glucose, Bld 89 70 - 99 mg/dL    Comment: Glucose reference range applies only to samples taken after fasting for at least 8 hours.   BUN 17 8 - 23 mg/dL   Creatinine, Ser 1.43 (H) 0.44 - 1.00 mg/dL   Calcium 8.0 (L) 8.9 - 10.3 mg/dL   GFR, Estimated 41 (L) >60 mL/min    Comment: (NOTE) Calculated using the CKD-EPI Creatinine Equation (2021)    Anion gap 6 5 - 15    Comment: Performed at [facility], [place] [address] [place], [place] [address]  Magnesium     Status: Abnormal   Collection Time: 05/26/21  4:00 AM  Result Value Ref Range    Magnesium 1.6 (L) 1.7 - 2.4 mg/dL    Comment: Performed at [facility], [place] [address], [place], [place] [address]  CBC with Differential/Platelet     Status: Abnormal   Collection Time: 05/27/21  5:22 AM  Result Value Ref Range   WBC 6.8 4.0 - 10.5 K/uL   RBC 2.40 (L) 3.87 - 5.11 MIL/uL   Hemoglobin 7.9 (L) 12.0 - 15.0 g/dL   HCT 24.0 (L) 36.0 - 46.0 %   MCV 100.0 80.0 - 100.0 fL   MCH 32.9 26.0 - 34.0 pg   MCHC 32.9 30.0 - 36.0 g/dL   RDW 17.2 (H) 11.5 - 15.5 %   Platelets 144 (L) 150 - 400 K/uL   nRBC 0.7 (H) 0.0 - 0.2 %   Neutrophils Relative % 34 %   Neutro Abs 2.3 1.7 - 7.7 K/uL   Lymphocytes Relative 23 %   Lymphs Abs 1.6 0.7 - 4.0 K/uL   Monocytes Relative 30 %   Monocytes Absolute 2.0 (H) 0.1 - 1.0 K/uL   Eosinophils Relative 0 %   Eosinophils Absolute 0.0 0.0 - 0.5 K/uL   Basophils Relative 1 %   Basophils Absolute 0.1 0.0 - 0.1 K/uL   Immature Granulocytes 12 %   Abs Immature Granulocytes 0.81 (H) 0.00 - 0.07 K/uL    Comment: Performed at [facility], [place] [address], [place], [place] [address]  Basic metabolic panel     Status: Abnormal   Collection Time: 05/27/21  5:22 AM  Result Value Ref Range   Sodium 134 (L) 135 - 145 mmol/L   Potassium 3.6 3.5 - 5.1 mmol/L   Chloride 107 98 - 111 mmol/L   CO2 19 (L) 22 - 32 mmol/L   Glucose, Bld 100 (H) 70 - 99 mg/dL    Comment: Glucose reference range applies only to samples taken after fasting for at least 8 hours.   BUN 16 8 - 23 mg/dL   Creatinine, Ser 1.51 (H) 0.44 - 1.00 mg/dL   Calcium 8.4 (L) 8.9 - 10.3 mg/dL   GFR, Estimated 39 (L) >60 mL/min    Comment: (NOTE) Calculated using the CKD-EPI Creatinine Equation (2021)    Anion gap 8 5 - 15    Comment: Performed at [facility], [place] [address], [place], [place] [address]  Sample to Blood Bank     Status: None   Collection Time: 05/28/21 11:32 AM  Result Value Ref  Range   Blood Bank Specimen SAMPLE  AVAILABLE FOR TESTING    Sample Expiration      05/31/2021,2359 Performed at [facility], [place] [address] [place], [place] [address]   CMP ([place] only)     Status: Abnormal   Collection Time: 05/28/21 11:32 AM  Result Value Ref Range   Sodium 134 (L) 135 - 145 mmol/L   Potassium 3.7 3.5 - 5.1 mmol/L   Chloride 107 98 - 111 mmol/L   CO2 16 (L) 22 - 32 mmol/L   Glucose, Bld 106 (H) 70 - 99 mg/dL    Comment: Glucose reference range applies only to samples taken after fasting for at least 8 hours.   BUN 19 8 - 23 mg/dL   Creatinine 1.33 (H) 0.44 - 1.00 mg/dL   Calcium 8.5 (L) 8.9 - 10.3 mg/dL   Total Protein 7.2 6.5 - 8.1 g/dL   Albumin 2.8 (L) 3.5 - 5.0 g/dL   AST 18 15 - 41 U/L   ALT 15 0 - 44 U/L   Alkaline Phosphatase 78 38 - 126 U/L   Total Bilirubin 0.3 0.3 - 1.2 mg/dL   GFR, Estimated 45 (L) >60 mL/min    Comment: (NOTE) Calculated using the CKD-EPI Creatinine Equation (2021)    Anion gap 11 5 - 15    Comment: Performed at [facility], [place] [address], [place], [place] [address]  CBC with Differential ([place] Only)     Status: Abnormal   Collection Time: 05/28/21 11:32 AM  Result Value Ref Range   WBC Count 12.1 (H) 4.0 - 10.5 K/uL   RBC 2.45 (L) 3.87 - 5.11 MIL/uL   Hemoglobin 8.0 (L) 12.0 - 15.0 g/dL   HCT 24.4 (L) 36.0 - 46.0 %   MCV 99.6 80.0 - 100.0 fL   MCH 32.7 26.0 - 34.0 pg   MCHC 32.8 30.0 - 36.0 g/dL   RDW 17.4 (H) 11.5 - 15.5 %   Platelet Count 204 150 - 400 K/uL   nRBC 0.7 (H) 0.0 - 0.2 %   Neutrophils Relative % 50 %    Comment: metas and myelos seen   Neutro Abs 6.1 1.7 - 7.7 K/uL   Lymphocytes Relative 15 %   Lymphs Abs 1.8 0.7 - 4.0 K/uL   Monocytes Relative 17 %   Monocytes Absolute 2.0 (H) 0.1 - 1.0 K/uL   Eosinophils Relative 0 %   Eosinophils Absolute 0.0 0.0 - 0.5 K/uL   Basophils Relative 0 %   Basophils Absolute 0.0 0.0 - 0.1 K/uL   Immature Granulocytes 18 %   Abs Immature  Granulocytes 2.17 (H) 0.00 - 0.07 K/uL    Comment: Performed at [facility], [address] [place],  [address]  CBC with Differential/Platelet     Status: Abnormal   Collection Time: 06/04/21 12:00 AM  Result Value Ref Range   WBC 8.4 3.8 - 10.8 Thousand/uL   RBC 2.56 (L) 3.80 - 5.10 Million/uL   Hemoglobin 8.3 (L) 11.7 - 15.5 g/dL   HCT 24.9 (L) 35.0 - 45.0 %   MCV 97.3 80.0 - 100.0 fL   MCH 32.4 27.0 - 33.0 pg   MCHC 33.3 32.0 - 36.0 g/dL   RDW 16.3 (H) 11.0 - 15.0 %   Platelets 176 140 - 400 Thousand/uL   MPV 10.4 7.5 - 12.5 fL   Neutro Abs 6,737 1,500 - 7,800 cells/uL   Lymphs Abs  1,487 850 - 3,900 cells/uL   Absolute Monocytes 151 (L) 200 - 950 cells/uL   Eosinophils Absolute 8 (L) 15 - 500 cells/uL   Basophils Absolute 17 0 - 200 cells/uL   Neutrophils Relative % 80.2 %   Total Lymphocyte 17.7 %   Monocytes Relative 1.8 %   Eosinophils Relative 0.1 %   Basophils Relative 0.2 %   Smear Review      Comment: Review of peripheral smear confirms automated results.   COMPLETE METABOLIC PANEL WITH GFR     Status: Abnormal   Collection Time: 06/04/21 12:00 AM  Result Value Ref Range   Glucose, Bld 102 (H) 65 - 99 mg/dL    Comment: .            Fasting reference interval . For someone without known diabetes, a glucose value between 100 and 125 mg/dL is consistent with prediabetes and should be confirmed with a follow-up test. .    BUN 25 7 - 25 mg/dL   Creat 1.32 (H) 0.50 - 0.99 mg/dL    Comment: For patients >6 years of age, the reference limit for Creatinine is approximately 13% higher for people identified as African-American. .    GFR, Est Non African American 43 (L) > OR = 60 mL/min/1.79m2   GFR, Est African American 50 (L) > OR = 60 mL/min/1.74m2   BUN/Creatinine Ratio 19 6 - 22 (calc)   Sodium 136 135 - 146 mmol/L   Potassium 4.6 3.5 - 5.3 mmol/L   Chloride 102 98 - 110 mmol/L   CO2 23 20 - 32 mmol/L   Calcium 8.8 8.6 -  10.4 mg/dL   Total Protein 7.2 6.1 - 8.1 g/dL   Albumin 4.0 3.6 - 5.1 g/dL   Globulin 3.2 1.9 - 3.7 g/dL (calc)   AG Ratio 1.3 1.0 - 2.5 (calc)   Total Bilirubin 0.5 0.2 - 1.2 mg/dL   Alkaline phosphatase (APISO) 75 37 - 153 U/L   AST 12 10 - 35 U/L   ALT 9 6 - 29 U/L  TSH     Status: None   Collection Time: 06/04/21 12:00 AM  Result Value Ref Range   TSH 2.29 0.40 - 4.50 mIU/L  Magnesium     Status: Abnormal   Collection Time: 06/04/21 12:00 AM  Result Value Ref Range   Magnesium 1.4 (L) 1.5 - 2.5 mg/dL  Phosphorus     Status: None   Collection Time: 06/04/21 12:00 AM  Result Value Ref Range   Phosphorus 3.2 2.5 - 4.5 mg/dL    No results found.     All questions at time of visit were answered - patient instructed to contact office with any additional concerns or updates. ER/RTC precautions were reviewed with the patient as applicable.   Please note: manual typing as well as voice recognition software may have been used to produce this document - typos may escape review. Please contact Dr. Sheppard Coil for any needed clarifications.

## 2021-06-04 NOTE — Patient Instructions (Signed)
Labs today, will forward to oncology & urology. Please seek emergency care if severe pain, fever/weakness, other serious concerns!

## 2021-06-05 ENCOUNTER — Other Ambulatory Visit: Payer: Self-pay | Admitting: Osteopathic Medicine

## 2021-06-05 ENCOUNTER — Other Ambulatory Visit (HOSPITAL_COMMUNITY): Payer: Self-pay

## 2021-06-05 ENCOUNTER — Ambulatory Visit (INDEPENDENT_AMBULATORY_CARE_PROVIDER_SITE_OTHER): Payer: No Typology Code available for payment source | Admitting: Osteopathic Medicine

## 2021-06-05 ENCOUNTER — Telehealth: Payer: Self-pay | Admitting: *Deleted

## 2021-06-05 MED ORDER — ARIPIPRAZOLE 10 MG PO TABS
ORAL_TABLET | Freq: Every day | ORAL | 0 refills | Status: DC
  Filled 2021-06-05: qty 90, 90d supply, fill #0

## 2021-06-05 NOTE — Progress Notes (Signed)
Seen yesterday, appt not cancelled  - error

## 2021-06-05 NOTE — Addendum Note (Signed)
Addended by: Lin Givens on: 06/05/2021 01:32 PM   Modules accepted: Orders

## 2021-06-06 ENCOUNTER — Other Ambulatory Visit (HOSPITAL_COMMUNITY): Payer: Self-pay

## 2021-06-06 ENCOUNTER — Encounter: Payer: Self-pay | Admitting: General Practice

## 2021-06-06 MED ORDER — URIBEL 118 MG PO CAPS
ORAL_CAPSULE | ORAL | 3 refills | Status: DC
  Filled 2021-06-06: qty 30, 10d supply, fill #0

## 2021-06-06 MED ORDER — CEPHALEXIN 250 MG PO CAPS
ORAL_CAPSULE | ORAL | 1 refills | Status: DC
  Filled 2021-06-06: qty 30, 30d supply, fill #0
  Filled 2021-07-03: qty 30, 30d supply, fill #1
  Filled 2021-08-13: qty 30, 30d supply, fill #2
  Filled 2021-09-15: qty 30, 30d supply, fill #3
  Filled 2021-10-16: qty 30, 30d supply, fill #4
  Filled 2021-11-18: qty 30, 30d supply, fill #5

## 2021-06-06 NOTE — Progress Notes (Signed)
[facility] Psychosocial Distress Screening Clinical Social Work  Clinical Social Work was referred by distress screening protocol.  The patient scored a 5 on the Psychosocial Distress Thermometer which indicates moderate distress. Clinical Social Worker contacted patient by phone to assess for distress and other psychosocial needs. Called patient, she is recovering from "urosepsis", is on long term disability due to cancer. "I can buy bills and groceries."  She is quite fatigued which limits her ability to participate in activities she used to enjoy such as quilting.  She is an avid Engineer, manufacturing systems, but now finds that "all I can do is sit at my sewing machine and turn it on."  She has support from her husband but has been limiting contact w others due to fatigue/feeling unwell.  CSW and patient discussed common feeling and emotions when being diagnosed with cancer, and the importance of support during treatment.  CSW informed patient of the support team and support services at [facility].  CSW provided contact information and encouraged patient to call with any questions or concerns. Emailed her [place] and [place] calendars, also asked CSW Wallis Bamberg to connect w her on [employer] to discuss [employer].    ONCBCN DISTRESS SCREENING 06/05/2021  Screening Type Change in Status  Distress experienced in past week (1-10) 5  Emotional problem type   Information Concerns Type   Physical Problem type Loss of appetitie  Referral to dietition Yes    Clinical Social Worker follow up needed: No.  If yes, follow up plan:  Beverely Pace, [place], LCSW Clinical Social Worker Phone:  [phone]

## 2021-06-11 ENCOUNTER — Other Ambulatory Visit: Payer: Self-pay | Admitting: Oncology

## 2021-06-11 ENCOUNTER — Inpatient Hospital Stay: Payer: No Typology Code available for payment source

## 2021-06-11 ENCOUNTER — Other Ambulatory Visit: Payer: Self-pay

## 2021-06-11 ENCOUNTER — Other Ambulatory Visit: Payer: Self-pay | Admitting: *Deleted

## 2021-06-11 ENCOUNTER — Inpatient Hospital Stay (HOSPITAL_BASED_OUTPATIENT_CLINIC_OR_DEPARTMENT_OTHER): Payer: No Typology Code available for payment source | Admitting: Oncology

## 2021-06-11 VITALS — BP 106/63 | HR 88 | Temp 97.4°F | Resp 17 | Ht 66.0 in | Wt 175.5 lb

## 2021-06-11 DIAGNOSIS — C801 Malignant (primary) neoplasm, unspecified: Secondary | ICD-10-CM

## 2021-06-11 DIAGNOSIS — Z95828 Presence of other vascular implants and grafts: Secondary | ICD-10-CM

## 2021-06-11 DIAGNOSIS — C669 Malignant neoplasm of unspecified ureter: Secondary | ICD-10-CM

## 2021-06-11 DIAGNOSIS — Z5111 Encounter for antineoplastic chemotherapy: Secondary | ICD-10-CM | POA: Diagnosis not present

## 2021-06-11 LAB — CMP (CANCER CENTER ONLY)
ALT: 8 U/L (ref 0–44)
AST: 16 U/L (ref 15–41)
Albumin: 3.1 g/dL — ABNORMAL LOW (ref 3.5–5.0)
Alkaline Phosphatase: 90 U/L (ref 38–126)
Anion gap: 11 (ref 5–15)
BUN: 18 mg/dL (ref 8–23)
CO2: 20 mmol/L — ABNORMAL LOW (ref 22–32)
Calcium: 8.6 mg/dL — ABNORMAL LOW (ref 8.9–10.3)
Chloride: 107 mmol/L (ref 98–111)
Creatinine: 1.62 mg/dL — ABNORMAL HIGH (ref 0.44–1.00)
GFR, Estimated: 35 mL/min — ABNORMAL LOW (ref >60)
Glucose, Bld: 107 mg/dL — ABNORMAL HIGH (ref 70–99)
Potassium: 4.1 mmol/L (ref 3.5–5.1)
Sodium: 138 mmol/L (ref 135–145)
Total Bilirubin: 0.3 mg/dL (ref 0.3–1.2)
Total Protein: 6.9 g/dL (ref 6.5–8.1)

## 2021-06-11 LAB — CBC WITH DIFFERENTIAL (CANCER CENTER ONLY)
Abs Immature Granulocytes: 0.04 10*3/uL (ref 0.00–0.07)
Basophils Absolute: 0 10*3/uL (ref 0.0–0.1)
Basophils Relative: 1 %
Eosinophils Absolute: 0 10*3/uL (ref 0.0–0.5)
Eosinophils Relative: 1 %
HCT: 20.4 % — ABNORMAL LOW (ref 36.0–46.0)
Hemoglobin: 6.6 g/dL — CL (ref 12.0–15.0)
Immature Granulocytes: 2 %
Lymphocytes Relative: 52 %
Lymphs Abs: 1.4 10*3/uL (ref 0.7–4.0)
MCH: 33.3 pg (ref 26.0–34.0)
MCHC: 32.8 g/dL (ref 30.0–36.0)
MCV: 101.5 fL — ABNORMAL HIGH (ref 80.0–100.0)
Monocytes Absolute: 0.5 10*3/uL (ref 0.1–1.0)
Monocytes Relative: 20 %
Neutro Abs: 0.6 10*3/uL — ABNORMAL LOW (ref 1.7–7.7)
Neutrophils Relative %: 24 %
Platelet Count: 108 10*3/uL — ABNORMAL LOW (ref 150–400)
RBC: 2.01 MIL/uL — ABNORMAL LOW (ref 3.87–5.11)
RDW: 18.3 % — ABNORMAL HIGH (ref 11.5–15.5)
WBC Count: 2.6 10*3/uL — ABNORMAL LOW (ref 4.0–10.5)
nRBC: 7.4 % — ABNORMAL HIGH (ref 0.0–0.2)

## 2021-06-11 LAB — PREPARE RBC (CROSSMATCH)

## 2021-06-11 MED ORDER — ACETAMINOPHEN 325 MG PO TABS
ORAL_TABLET | ORAL | Status: AC
  Filled 2021-06-11: qty 2

## 2021-06-11 MED ORDER — DIPHENHYDRAMINE HCL 25 MG PO CAPS
ORAL_CAPSULE | ORAL | Status: AC
  Filled 2021-06-11: qty 1

## 2021-06-11 MED ORDER — SODIUM CHLORIDE 0.9% FLUSH
10.0000 mL | INTRAVENOUS | Status: AC | PRN
  Administered 2021-06-11: 10 mL
  Filled 2021-06-11: qty 10

## 2021-06-11 MED ORDER — SODIUM CHLORIDE 0.9% FLUSH
10.0000 mL | Freq: Once | INTRAVENOUS | Status: AC
Start: 2021-06-11 — End: 2021-06-11
  Administered 2021-06-11: 10 mL
  Filled 2021-06-11: qty 10

## 2021-06-11 MED ORDER — HEPARIN SOD (PORK) LOCK FLUSH 100 UNIT/ML IV SOLN
500.0000 [IU] | Freq: Every day | INTRAVENOUS | Status: AC | PRN
  Administered 2021-06-11: 500 [IU]
  Filled 2021-06-11: qty 5

## 2021-06-11 MED ORDER — SODIUM CHLORIDE 0.9% IV SOLUTION
250.0000 mL | Freq: Once | INTRAVENOUS | Status: AC
  Administered 2021-06-11: 250 mL via INTRAVENOUS
  Filled 2021-06-11: qty 250

## 2021-06-11 MED ORDER — DIPHENHYDRAMINE HCL 25 MG PO CAPS
25.0000 mg | ORAL_CAPSULE | Freq: Once | ORAL | Status: AC
Start: 2021-06-11 — End: 2021-06-11
  Administered 2021-06-11: 25 mg via ORAL

## 2021-06-11 MED ORDER — ACETAMINOPHEN 325 MG PO TABS
650.0000 mg | ORAL_TABLET | Freq: Once | ORAL | Status: DC
Start: 2021-06-11 — End: 2021-06-11

## 2021-06-11 NOTE — Progress Notes (Signed)
Denies anyHematology and Oncology Follow Up Visit  Mary Stark [id number] [birth year] 65 y.o. 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     Prior Therapy:  She is status post retroperitoneal lymph node biopsy completed on 10/24/2020 which showed metastatic carcinoma.  She is status post repeat cystoscopy and bilateral ureteral stent exchange as well as resection of a bladder tumor on January 21, 2021.  A final pathology showed an adenocarcinoma.  Chemotherapy utilizing carboplatin and gemcitabine started on November 22, 2020.  She completed 6 cycles of therapy on March 06, 2021.  Current therapy: FOLFOX chemotherapy started on Apr 15, 2021.  He is here for cycle 5 of therapy.   Interim History: Mary Stark returns today for a follow-up evaluation.  Since her last visit, she has reported symptoms of excessive fatigue, tiredness  and occasional dyspnea on exertion.  She denies any fevers, chills or sweats.  She denies any weight loss or appetite changes.  She denies any recent cough or syncope.  She denies any bleeding complications.  Her performance status has been limited because of excessive fatigue at this time.     Medications: Updated on review. Current Outpatient Medications  Medication Sig Dispense Refill   acetaminophen (TYLENOL) 500 MG tablet Take 1,000 mg by mouth every 8 (eight) hours as needed for moderate pain.      albuterol (PROAIR HFA) 108 (90 Base) MCG/ACT inhaler INHALE 1 TO 2 PUFFS BY MOUTH INTO THE LUNGS EVERY 4 HOURS AS NEEDED FOR WHEEZING OR SHORTNESS OF BREATH 8.5 g 1   ARIPiprazole (ABILIFY) 10 MG tablet TAKE 1 TABLET BY MOUTH ONCE DAILY 90 tablet 0   ARIPiprazole 10 MG TABS Take 10 mg by mouth daily. 90 tablet 1   ascorbic acid (VITAMIN C) 500 MG tablet Take 1,000 mg by mouth every evening.       buPROPion (WELLBUTRIN XL) 300 MG 24 hr tablet TAKE 1 TABLET BY MOUTH EVERY DAY 30 tablet 3   cephALEXin (KEFLEX) 250 MG capsule TAKE ONE CAPSULE BY MOUTH NIGHTLY 90 capsule 1   Cholecalciferol (VITAMIN D) 125 MCG (5000 UT) CAPS Take 5,000 Units by mouth every evening.      clonazePAM (KLONOPIN) 0.5 MG tablet TAKE 1 TABLET BY MOUTH AT BEDTIME 30 tablet 0   Coenzyme Q10 (CO Q-10) 200 MG CAPS Take 200 mg by mouth every evening.      gabapentin (NEURONTIN) 300 MG capsule TAKE 1 CAPSULE BY MOUTH IN THE MORNING, 1 CAP IN THE AFTERNOON & 4 CAPS AT NIGHT 180 capsule 3   GLUCOSAMINE-CHONDROITIN PO Take 2 tablets by mouth every evening.      lidocaine-prilocaine (EMLA) cream Apply 1 application topically as needed. 30 g 0   megestrol (MEGACE) 40 MG tablet Take 1 tablet (40 mg total) by mouth 2 (two) times daily. 60 tablet 1   Meth-Hyo-M Bl-Na Phos-Ph Sal (URIBEL) 118 MG CAPS Take one capsule by mouth 3 times daily 30 capsule 3   mirabegron ER (MYRBETRIQ) 25 MG TB24 tablet TAKE 1 TABLET BY MOUTH ONCE A DAY 30 tablet 3   Nebivolol HCl 20 MG TABS Take 0.5 tablets (10 mg total) by mouth daily. 90 tablet 2   Omega-3 Fatty Acids (FISH OIL) 1200 MG CAPS Take 1,200 mg by mouth every evening.      omeprazole (PRILOSEC) 20 MG capsule TAKE 1 CAPSULE BY  MOUTH ONCE A DAY * NEEDS APPT FOR REFILLS 90 capsule 0   ondansetron (ZOFRAN) 4 MG tablet Take 1 tablet (4 mg total) by mouth every 8 (eight) hours as needed for nausea or vomiting. 40 tablet 3   oxybutynin (DITROPAN) 5 MG tablet TAKE 1 TABLET BY MOUTH EVERY 8 HOURS AS NEEDED 90 tablet 3   oxymetazoline (AFRIN) 0.05 % nasal spray Place 1 spray into both nostrils 2 (two) times daily as needed for congestion.     Phenazopyridine HCl (AZO URINARY PAIN PO) Take 1 tablet by mouth 3 (three) times daily as needed (bladder spasms).     simvastatin (ZOCOR) 20 MG tablet TAKE 1 TABLET (20 MG TOTAL) BY MOUTH DAILY. 90 tablet 0   tamsulosin (FLOMAX) 0.4 MG CAPS capsule TAKE 1  CAPSULE BY MOUTH AT BEDTIME 30 capsule 3   thiamine 100 MG tablet Take 1 tablet (100 mg total) by mouth daily. 30 tablet 0   vitamin B-12 (CYANOCOBALAMIN) 1000 MCG tablet Take 2,000 mcg by mouth every evening.      Vitamin E 180 MG (400 UNIT) CAPS Take 400 Units by mouth every evening.      No current facility-administered medications for this visit.     Allergies:  Allergies  Allergen Reactions   Metformin And Related Other (See Comments)    Lactic acidosis    Penicillins Hives and Rash    Reports hives to penicillin and amoxicillin as an adult "a long time ago." Has tolerated cefdinir (09/2020) and cefepime (10/2020) before.      Physical Exam:           ECOG: 1   General appearance: Alert, awake without any distress. Head: Atraumatic without abnormalities Oropharynx: Without any thrush or ulcers. Eyes: No scleral icterus. Lymph nodes: No lymphadenopathy noted in the cervical, supraclavicular, or axillary nodes Heart:regular rate and rhythm, without any murmurs or gallops.   Lung: Clear to auscultation without any rhonchi, wheezes or dullness to percussion. Abdomin: Soft, nontender without any shifting dullness or ascites. Musculoskeletal: No clubbing or cyanosis. Neurological: No motor or sensory deficits. Skin: No rashes or lesions.             Lab Results: Lab Results  Component Value Date   WBC 8.4 06/04/2021   HGB 8.3 (L) 06/04/2021   HCT 24.9 (L) 06/04/2021   MCV 97.3 06/04/2021   PLT 176 06/04/2021     Chemistry      Component Value Date/Time   NA 136 06/04/2021 0000   NA 142 04/26/2018 1333   K 4.6 06/04/2021 0000   CL 102 06/04/2021 0000   CO2 23 06/04/2021 0000   BUN 25 06/04/2021 0000   BUN 22 04/26/2018 1333   CREATININE 1.32 (H) 06/04/2021 0000   GLU 87 02/07/2013 0000      Component Value Date/Time   CALCIUM 8.8 06/04/2021 0000   ALKPHOS 78 05/28/2021 1132   AST 12 06/04/2021 0000   AST 18 05/28/2021 1132   ALT 9  06/04/2021 0000   ALT 15 05/28/2021 1132   BILITOT 0.5 06/04/2021 0000   BILITOT 0.3 05/28/2021 1132       Impression and Plan:    65 year old woman with:   1.    Advanced bladder cancer presented with pelvic adenopathy in November 2021.  She has stage IV adenocarcinoma.   She has tolerated FOLFOX chemotherapy without any major complaints.  Risks and benefits of continuing this treatment given her reasonable response were discussed.  Given her severe cytopenias today we will delay the start of cycle 5 for 2 weeks with a dose modification and possible growth factor support to prevent any further neutropenia.  Upon completing cycle 8 of therapy we will update her staging scans.   2.  IV access: Port-A-Cath remains in place without any issues.   3.  Antiemetics: No nausea or vomiting reported at this time.  Antiemetics are available to her.   4.  Renal function surveillance: Her creatinine clearance remained stable around 45 cc/min.   5.  Goals of care: Therapy remains palliative although aggressive measures are warranted given her reasonable performance status.   6.  Anemia: Related to chemotherapy and malignancy.  Her hemoglobin is 6.6 and she will receive a transfusion.  7.  Neutropenia: Chemotherapy will be delayed and will start growth factor support at day 3 of each cycle.   8.  Follow-up: She will return in 2 weeks for the next cycle of therapy.   30  minutes were dedicated to this encounter.  The time was spent on reviewing laboratory data, disease status update, treatment choices and future plan of care discussion.   Zola Button, MD 6/29/20229:00 AM

## 2021-06-11 NOTE — Patient Instructions (Signed)
Blood Transfusion, Adult, Care After This sheet gives you information about how to care for yourself after your procedure. Your doctor may also give you more specific instructions. If youhave problems or questions, contact your doctor. What can I expect after the procedure? After the procedure, it is common to have: Bruising and soreness at the IV site. A fever or chills on the day of the procedure. This may be your body's response to the new blood cells received. A headache. Follow these instructions at home: Insertion site care     Follow instructions from your doctor about how to take care of your insertion site. This is where an IV tube was put into your vein. Make sure you: Wash your hands with soap and water before and after you change your bandage (dressing). If you cannot use soap and water, use hand sanitizer. Change your bandage as told by your doctor. Check your insertion site every day for signs of infection. Check for: Redness, swelling, or pain. Bleeding from the site. Warmth. Pus or a bad smell. General instructions Take over-the-counter and prescription medicines only as told by your doctor. Rest as told by your doctor. Go back to your normal activities as told by your doctor. Keep all follow-up visits as told by your doctor. This is important. Contact a doctor if: You have itching or red, swollen areas of skin (hives). You feel worried or nervous (anxious). You feel weak after doing your normal activities. You have redness, swelling, warmth, or pain around the insertion site. You have blood coming from the insertion site, and the blood does not stop with pressure. You have pus or a bad smell coming from the insertion site. Get help right away if: You have signs of a serious reaction. This may be coming from an allergy or the body's defense system (immune system). Signs include: Trouble breathing or shortness of breath. Swelling of the face or feeling warm  (flushed). Fever or chills. Head, chest, or back pain. Dark pee (urine) or blood in the pee. Widespread rash. Fast heartbeat. Feeling dizzy or light-headed. You may receive your blood transfusion in an outpatient setting. If so, youwill be told whom to contact to report any reactions. These symptoms may be an emergency. Do not wait to see if the symptoms will go away. Get medical help right away. Call your local emergency services (911 in the [place] Do not drive yourself to the hospital. Summary Bruising and soreness at the IV site are common. Check your insertion site every day for signs of infection. Rest as told by your doctor. Go back to your normal activities as told by your doctor. Get help right away if you have signs of a serious reaction. This information is not intended to replace advice given to you by your health care provider. Make sure you discuss any questions you have with your healthcare provider. Document Revised: 05/25/2019 Document Reviewed: 05/25/2019 Elsevier Patient Education  2022 Elsevier Inc.  

## 2021-06-12 LAB — TYPE AND SCREEN
ABO/RH(D): A POS
Antibody Screen: NEGATIVE
Unit division: 0
Unit division: 0

## 2021-06-12 LAB — BPAM RBC
Blood Product Expiration Date: 202207252359
Blood Product Expiration Date: 202207262359
ISSUE DATE / TIME: 202206291041
ISSUE DATE / TIME: 202206291041
Unit Type and Rh: 6200
Unit Type and Rh: 6200

## 2021-06-12 LAB — SAMPLE TO BLOOD BANK

## 2021-06-13 ENCOUNTER — Other Ambulatory Visit (HOSPITAL_COMMUNITY): Payer: Self-pay

## 2021-06-23 ENCOUNTER — Other Ambulatory Visit: Payer: Self-pay | Admitting: Osteopathic Medicine

## 2021-06-23 ENCOUNTER — Other Ambulatory Visit (HOSPITAL_COMMUNITY): Payer: Self-pay

## 2021-06-23 DIAGNOSIS — Z791 Long term (current) use of non-steroidal anti-inflammatories (NSAID): Secondary | ICD-10-CM

## 2021-06-23 MED ORDER — OMEPRAZOLE 20 MG PO CPDR
20.0000 mg | DELAYED_RELEASE_CAPSULE | Freq: Every day | ORAL | 3 refills | Status: DC
  Filled 2021-06-23: qty 90, 90d supply, fill #0
  Filled 2021-09-24: qty 90, 90d supply, fill #1
  Filled 2021-12-25: qty 90, 90d supply, fill #2
  Filled 2022-03-30: qty 90, 90d supply, fill #3

## 2021-06-25 ENCOUNTER — Other Ambulatory Visit: Payer: Self-pay

## 2021-06-25 ENCOUNTER — Inpatient Hospital Stay: Payer: No Typology Code available for payment source | Attending: Oncology

## 2021-06-25 ENCOUNTER — Inpatient Hospital Stay: Payer: No Typology Code available for payment source

## 2021-06-25 ENCOUNTER — Inpatient Hospital Stay (HOSPITAL_BASED_OUTPATIENT_CLINIC_OR_DEPARTMENT_OTHER): Payer: No Typology Code available for payment source | Admitting: Oncology

## 2021-06-25 VITALS — BP 147/87 | HR 66

## 2021-06-25 VITALS — BP 164/90 | HR 70 | Temp 98.2°F | Resp 18 | Ht 66.0 in | Wt 180.3 lb

## 2021-06-25 DIAGNOSIS — Z95828 Presence of other vascular implants and grafts: Secondary | ICD-10-CM

## 2021-06-25 DIAGNOSIS — C669 Malignant neoplasm of unspecified ureter: Secondary | ICD-10-CM | POA: Diagnosis not present

## 2021-06-25 DIAGNOSIS — D630 Anemia in neoplastic disease: Secondary | ICD-10-CM | POA: Diagnosis not present

## 2021-06-25 DIAGNOSIS — C801 Malignant (primary) neoplasm, unspecified: Secondary | ICD-10-CM

## 2021-06-25 DIAGNOSIS — Z5189 Encounter for other specified aftercare: Secondary | ICD-10-CM | POA: Diagnosis not present

## 2021-06-25 DIAGNOSIS — D701 Agranulocytosis secondary to cancer chemotherapy: Secondary | ICD-10-CM | POA: Diagnosis not present

## 2021-06-25 DIAGNOSIS — Z5111 Encounter for antineoplastic chemotherapy: Secondary | ICD-10-CM | POA: Diagnosis not present

## 2021-06-25 LAB — CBC WITH DIFFERENTIAL (CANCER CENTER ONLY)
Abs Immature Granulocytes: 0.21 10*3/uL — ABNORMAL HIGH (ref 0.00–0.07)
Basophils Absolute: 0.1 10*3/uL (ref 0.0–0.1)
Basophils Relative: 1 %
Eosinophils Absolute: 0.2 10*3/uL (ref 0.0–0.5)
Eosinophils Relative: 2 %
HCT: 33.3 % — ABNORMAL LOW (ref 36.0–46.0)
Hemoglobin: 10.9 g/dL — ABNORMAL LOW (ref 12.0–15.0)
Immature Granulocytes: 3 %
Lymphocytes Relative: 26 %
Lymphs Abs: 2.2 10*3/uL (ref 0.7–4.0)
MCH: 32.4 pg (ref 26.0–34.0)
MCHC: 32.7 g/dL (ref 30.0–36.0)
MCV: 99.1 fL (ref 80.0–100.0)
Monocytes Absolute: 1.2 10*3/uL — ABNORMAL HIGH (ref 0.1–1.0)
Monocytes Relative: 14 %
Neutro Abs: 4.6 10*3/uL (ref 1.7–7.7)
Neutrophils Relative %: 54 %
Platelet Count: 180 10*3/uL (ref 150–400)
RBC: 3.36 MIL/uL — ABNORMAL LOW (ref 3.87–5.11)
RDW: 17.2 % — ABNORMAL HIGH (ref 11.5–15.5)
WBC Count: 8.5 10*3/uL (ref 4.0–10.5)
nRBC: 0 % (ref 0.0–0.2)

## 2021-06-25 LAB — CMP (CANCER CENTER ONLY)
ALT: 9 U/L (ref 0–44)
AST: 14 U/L — ABNORMAL LOW (ref 15–41)
Albumin: 3.2 g/dL — ABNORMAL LOW (ref 3.5–5.0)
Alkaline Phosphatase: 92 U/L (ref 38–126)
Anion gap: 7 (ref 5–15)
BUN: 20 mg/dL (ref 8–23)
CO2: 26 mmol/L (ref 22–32)
Calcium: 9.2 mg/dL (ref 8.9–10.3)
Chloride: 109 mmol/L (ref 98–111)
Creatinine: 1.08 mg/dL — ABNORMAL HIGH (ref 0.44–1.00)
GFR, Estimated: 58 mL/min — ABNORMAL LOW (ref >60)
Glucose, Bld: 69 mg/dL — ABNORMAL LOW (ref 70–99)
Potassium: 4.7 mmol/L (ref 3.5–5.1)
Sodium: 142 mmol/L (ref 135–145)
Total Bilirubin: <0.2 mg/dL — ABNORMAL LOW (ref 0.3–1.2)
Total Protein: 7.3 g/dL (ref 6.5–8.1)

## 2021-06-25 LAB — SAMPLE TO BLOOD BANK

## 2021-06-25 MED ORDER — PALONOSETRON HCL INJECTION 0.25 MG/5ML
INTRAVENOUS | Status: AC
  Filled 2021-06-25: qty 5

## 2021-06-25 MED ORDER — SODIUM CHLORIDE 0.9% FLUSH
10.0000 mL | Freq: Once | INTRAVENOUS | Status: AC
  Administered 2021-06-25: 10 mL
  Filled 2021-06-25: qty 10

## 2021-06-25 MED ORDER — SODIUM CHLORIDE 0.9 % IV SOLN
10.0000 mg | Freq: Once | INTRAVENOUS | Status: AC
  Administered 2021-06-25: 10 mg via INTRAVENOUS
  Filled 2021-06-25: qty 1
  Filled 2021-06-25: qty 10

## 2021-06-25 MED ORDER — DEXTROSE 5 % IV SOLN
Freq: Once | INTRAVENOUS | Status: AC
  Filled 2021-06-25: qty 250

## 2021-06-25 MED ORDER — LEUCOVORIN CALCIUM INJECTION 350 MG
400.0000 mg/m2 | Freq: Once | INTRAVENOUS | Status: AC
  Administered 2021-06-25: 804 mg via INTRAVENOUS
  Filled 2021-06-25: qty 40.2

## 2021-06-25 MED ORDER — FLUOROURACIL CHEMO INJECTION 2.5 GM/50ML
400.0000 mg/m2 | Freq: Once | INTRAVENOUS | Status: AC
  Administered 2021-06-25: 800 mg via INTRAVENOUS
  Filled 2021-06-25: qty 16

## 2021-06-25 MED ORDER — OXALIPLATIN CHEMO INJECTION 100 MG/20ML
85.0000 mg/m2 | Freq: Once | INTRAVENOUS | Status: AC
  Administered 2021-06-25: 170 mg via INTRAVENOUS
  Filled 2021-06-25: qty 34

## 2021-06-25 MED ORDER — SODIUM CHLORIDE 0.9 % IV SOLN
2480.0000 mg/m2 | INTRAVENOUS | Status: DC
  Administered 2021-06-25: 5000 mg via INTRAVENOUS
  Filled 2021-06-25: qty 100

## 2021-06-25 MED ORDER — PALONOSETRON HCL INJECTION 0.25 MG/5ML
0.2500 mg | Freq: Once | INTRAVENOUS | Status: AC
Start: 2021-06-25 — End: 2021-06-25
  Administered 2021-06-25: 0.25 mg via INTRAVENOUS

## 2021-06-25 NOTE — Patient Instructions (Signed)

## 2021-06-25 NOTE — Progress Notes (Signed)
Denies anyHematology and Oncology Follow Up Visit  Mary Stark [id number] [birth year] 65 y.o. 06/25/2021 11:13 AM Mary Stark, DOAlexander, [place], DO   [name]: 65 year old woman with bladder cancer diagnosed in November 2021.  She to have stage IV adenocarcinoma with pelvic adenopathy.   Prior Therapy:  She is status post retroperitoneal lymph node biopsy completed on 10/24/2020 which showed metastatic carcinoma.  She is status post repeat cystoscopy and bilateral ureteral stent exchange as well as resection of a bladder tumor on January 21, 2021.  A final pathology showed an adenocarcinoma.  Chemotherapy utilizing carboplatin and gemcitabine started on November 22, 2020.  She completed 6 cycles of therapy on March 06, 2021.  Current therapy: FOLFOX chemotherapy started on Apr 15, 2021.  He is here for cycle 5 of therapy.   Interim History: [name] is here for a follow-up visit.  Since the last visit, she reports feeling well and significantly improved after packed red cell transfusion.  She reports her energy is improved and her shortness of breath has resolved.  He denies any nausea, vomiting or worsening neuropathy.  She denies any decline in her appetite or weight loss.  She has resumed most activities of daily living.  She denies any hematuria or dysuria or other complaints.     Medications: Unchanged on review. Current Outpatient Medications  Medication Sig Dispense Refill   acetaminophen (TYLENOL) 500 MG tablet Take 1,000 mg by mouth every 8 (eight) hours as needed for moderate pain.      albuterol (PROAIR HFA) 108 (90 Base) MCG/ACT inhaler INHALE 1 TO 2 PUFFS BY MOUTH INTO THE LUNGS EVERY 4 HOURS AS NEEDED FOR WHEEZING OR SHORTNESS OF BREATH 8.5 g 1   ARIPiprazole (ABILIFY) 10 MG tablet TAKE 1 TABLET BY MOUTH ONCE DAILY 90 tablet 0   ARIPiprazole 10 MG TABS Take 10 mg by mouth daily. 90 tablet 1   ascorbic acid (VITAMIN C) 500 MG tablet Take 1,000 mg  by mouth every evening.      buPROPion (WELLBUTRIN XL) 300 MG 24 hr tablet TAKE 1 TABLET BY MOUTH EVERY DAY 30 tablet 3   cephALEXin (KEFLEX) 250 MG capsule TAKE ONE CAPSULE BY MOUTH NIGHTLY 90 capsule 1   Cholecalciferol (VITAMIN D) 125 MCG (5000 UT) CAPS Take 5,000 Units by mouth every evening.      clonazePAM (KLONOPIN) 0.5 MG tablet TAKE 1 TABLET BY MOUTH AT BEDTIME 30 tablet 0   Coenzyme Q10 (CO Q-10) 200 MG CAPS Take 200 mg by mouth every evening.      gabapentin (NEURONTIN) 300 MG capsule TAKE 1 CAPSULE BY MOUTH IN THE MORNING, 1 CAP IN THE AFTERNOON & 4 CAPS AT NIGHT 180 capsule 3   GLUCOSAMINE-CHONDROITIN PO Take 2 tablets by mouth every evening.      lidocaine-prilocaine (EMLA) cream Apply 1 application topically as needed. 30 g 0   megestrol (MEGACE) 40 MG tablet Take 1 tablet (40 mg total) by mouth 2 (two) times daily. 60 tablet 1   Meth-Hyo-M Bl-Na Phos-Ph Sal (URIBEL) 118 MG CAPS Take one capsule by mouth 3 times daily 30 capsule 3   mirabegron ER (MYRBETRIQ) 25 MG TB24 tablet TAKE 1 TABLET BY MOUTH ONCE A DAY 30 tablet 3   Nebivolol HCl 20 MG TABS Take 0.5 tablets (10 mg total) by mouth daily. 90 tablet 2   Omega-3 Fatty Acids (FISH OIL) 1200 MG CAPS Take 1,200 mg by mouth every evening.      omeprazole (PRILOSEC)  20 MG capsule Take 1 capsule (20 mg total) by mouth daily. 90 capsule 3   ondansetron (ZOFRAN) 4 MG tablet Take 1 tablet (4 mg total) by mouth every 8 (eight) hours as needed for nausea or vomiting. 40 tablet 3   oxybutynin (DITROPAN) 5 MG tablet TAKE 1 TABLET BY MOUTH EVERY 8 HOURS AS NEEDED 90 tablet 3   oxymetazoline (AFRIN) 0.05 % nasal spray Place 1 spray into both nostrils 2 (two) times daily as needed for congestion.     Phenazopyridine HCl (AZO URINARY PAIN PO) Take 1 tablet by mouth 3 (three) times daily as needed (bladder spasms).     simvastatin (ZOCOR) 20 MG tablet TAKE 1 TABLET (20 MG TOTAL) BY MOUTH DAILY. 90 tablet 0   tamsulosin (FLOMAX) 0.4 MG CAPS  capsule TAKE 1 CAPSULE BY MOUTH AT BEDTIME 30 capsule 3   thiamine 100 MG tablet Take 1 tablet (100 mg total) by mouth daily. 30 tablet 0   vitamin B-12 (CYANOCOBALAMIN) 1000 MCG tablet Take 2,000 mcg by mouth every evening.      Vitamin E 180 MG (400 UNIT) CAPS Take 400 Units by mouth every evening.      No current facility-administered medications for this visit.     Allergies:  Allergies  Allergen Reactions   Metformin And Related Other (See Comments)    Lactic acidosis    Penicillins Hives and Rash    Reports hives to penicillin and amoxicillin as an adult "a long time ago." Has tolerated cefdinir (09/2020) and cefepime (10/2020) before.      Physical Exam:  Blood pressure (!) 164/90, pulse 70, temperature 98.2 F (36.8 C), temperature source Oral, resp. rate 18, height 5\' 6"  (1.676 m), weight 180 lb 4.8 oz (81.8 kg), SpO2 100 %.         ECOG: 1    General appearance: Comfortable appearing without any discomfort Head: Normocephalic without any trauma Oropharynx: Mucous membranes are moist and pink without any thrush or ulcers. Eyes: Pupils are equal and round reactive to light. Lymph nodes: No cervical, supraclavicular, inguinal or axillary lymphadenopathy.   Heart:regular rate and rhythm.  S1 and S2 without leg edema. Lung: Clear without any rhonchi or wheezes.  No dullness to percussion. Abdomin: Soft, nontender, nondistended with good bowel sounds.  No hepatosplenomegaly. Musculoskeletal: No joint deformity or effusion.  Full range of motion noted. Neurological: No deficits noted on motor, sensory and deep tendon reflex exam. Skin: No petechial rash or dryness.  Appeared moist.               Lab Results: Lab Results  Component Value Date   WBC 2.6 (L) 06/11/2021   HGB 6.6 (LL) 06/11/2021   HCT 20.4 (L) 06/11/2021   MCV 101.5 (H) 06/11/2021   PLT 108 (L) 06/11/2021     Chemistry      Component Value Date/Time   NA 138 06/11/2021 0847   NA  142 04/26/2018 1333   K 4.1 06/11/2021 0847   CL 107 06/11/2021 0847   CO2 20 (L) 06/11/2021 0847   BUN 18 06/11/2021 0847   BUN 22 04/26/2018 1333   CREATININE 1.62 (H) 06/11/2021 0847   CREATININE 1.32 (H) 06/04/2021 0000   GLU 87 02/07/2013 0000      Component Value Date/Time   CALCIUM 8.6 (L) 06/11/2021 0847   ALKPHOS 90 06/11/2021 0847   AST 16 06/11/2021 0847   ALT 8 06/11/2021 0847   BILITOT 0.3 06/11/2021 0847  Impression and Plan:    65 year old woman with:   1.    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   She is currently receiving palliative chemotherapy in the form of FOLFOX and completed 4 cycles of therapy with reasonable response based on imaging studies in June 2022.  Cycle 5 of chemotherapy was delayed because of cytopenia and ready to proceed at this time.  Risks and benefits of continuing this treatment were discussed at this time.  Complications that include nausea, vomiting, myelosuppression and worsening neuropathy were reviewed.  Plan is to update her staging scans after cycle 8.  She is agreeable to proceed at this time.   2.  IV access: Port-A-Cath currently in use without any issues.   3.  Antiemetics: Zofran is available to her without any worsening nausea or vomiting.   4.  Renal function surveillance: We will continue to monitor kidney function on platinum therapy.  Creatinine clearance is currently at baseline.   5.  Goals of care: Her disease is incurable although aggressive measures are warranted at this time.   6.  Anemia: Related to malignancy and chemotherapy.  She required packed red cell transfusion in the last 2 weeks.  Hemoglobin today has recovered at this time.  7.  Neutropenia: Related to chemotherapy.  Growth factor support will be added to each cycle of chemotherapy moving forward.  White cell count is adequate to proceed with treatment today.   8.  Follow-up: In 2 weeks  for the next cycle of therapy.   30  minutes were spent on this visit.  The time was dedicated to reviewing laboratory data, treatment choices, addressing complications related to her cancer and cancer therapy.   Zola Button, MD 7/13/202211:13 AM

## 2021-06-25 NOTE — Patient Instructions (Signed)
[facility]  Discharge Instructions: Thank you for choosing [facility] to provide your oncology and hematology care.   If you have a lab appointment with the Cancer Center, please go directly to the Cancer Center and check in at the registration area.   Wear comfortable clothing and clothing appropriate for easy access to any Portacath or PICC line.   We strive to give you quality time with your provider. You may need to reschedule your appointment if you arrive late (15 or more minutes).  Arriving late affects you and other patients whose appointments are after yours.  Also, if you miss three or more appointments without notifying the office, you may be dismissed from the clinic at the provider's discretion.      For prescription refill requests, have your pharmacy contact our office and allow 72 hours for refills to be completed.    Today you received the following chemotherapy and/or immunotherapy agents   To help prevent nausea and vomiting after your treatment, we encourage you to take your nausea medication as directed.  BELOW ARE SYMPTOMS THAT SHOULD BE REPORTED IMMEDIATELY: *FEVER GREATER THAN 100.4 F (38 C) OR HIGHER *CHILLS OR SWEATING *NAUSEA AND VOMITING THAT IS NOT CONTROLLED WITH YOUR NAUSEA MEDICATION *UNUSUAL SHORTNESS OF BREATH *UNUSUAL BRUISING OR BLEEDING *URINARY PROBLEMS (pain or burning when urinating, or frequent urination) *BOWEL PROBLEMS (unusual diarrhea, constipation, pain near the anus) TENDERNESS IN MOUTH AND THROAT WITH OR WITHOUT PRESENCE OF ULCERS (sore throat, sores in mouth, or a toothache) UNUSUAL RASH, SWELLING OR PAIN  UNUSUAL VAGINAL DISCHARGE OR ITCHING   Items with * indicate a potential emergency and should be followed up as soon as possible or go to the Emergency Department if any problems should occur.  Please show the CHEMOTHERAPY ALERT CARD or IMMUNOTHERAPY ALERT CARD at check-in to the Emergency  Department and triage nurse.  Should you have questions after your visit or need to cancel or reschedule your appointment, please contact [facility]  Dept: [phone]  and follow the prompts.  Office hours are 8:00 a.m. to 4:30 p.m. Monday - Friday. Please note that voicemails left after 4:00 p.m. may not be returned until the following business day.  We are closed weekends and major holidays. You have access to a nurse at all times for urgent questions. Please call the main number to the clinic Dept: [phone] and follow the prompts.   For any non-urgent questions, you may also contact your provider using MyChart. We now offer e-Visits for anyone 18 and older to request care online for non-urgent symptoms. For details visit [email or web address].   Also download the MyChart app! Go to the app store, search "MyChart", open the app, select [facility], and log in with your MyChart username and password.  Due to Covid, a mask is required upon entering the hospital/clinic. If you do not have a mask, one will be given to you upon arrival. For doctor visits, patients may have 1 support person aged 18 or older with them. For treatment visits, patients cannot have anyone with them due to current Covid guidelines and our immunocompromised population.   

## 2021-06-27 ENCOUNTER — Inpatient Hospital Stay: Payer: No Typology Code available for payment source

## 2021-06-27 ENCOUNTER — Other Ambulatory Visit: Payer: Self-pay

## 2021-06-27 ENCOUNTER — Inpatient Hospital Stay (HOSPITAL_BASED_OUTPATIENT_CLINIC_OR_DEPARTMENT_OTHER): Payer: No Typology Code available for payment source

## 2021-06-27 ENCOUNTER — Telehealth: Payer: Self-pay | Admitting: Oncology

## 2021-06-27 VITALS — BP 108/73 | HR 64 | Temp 98.3°F | Resp 16

## 2021-06-27 DIAGNOSIS — Z23 Encounter for immunization: Secondary | ICD-10-CM

## 2021-06-27 DIAGNOSIS — C669 Malignant neoplasm of unspecified ureter: Secondary | ICD-10-CM

## 2021-06-27 DIAGNOSIS — C801 Malignant (primary) neoplasm, unspecified: Secondary | ICD-10-CM

## 2021-06-27 DIAGNOSIS — Z95828 Presence of other vascular implants and grafts: Secondary | ICD-10-CM

## 2021-06-27 DIAGNOSIS — Z5111 Encounter for antineoplastic chemotherapy: Secondary | ICD-10-CM | POA: Diagnosis not present

## 2021-06-27 MED ORDER — PEGFILGRASTIM-JMDB 6 MG/0.6ML ~~LOC~~ SOSY
6.0000 mg | PREFILLED_SYRINGE | Freq: Once | SUBCUTANEOUS | Status: AC
  Administered 2021-06-27: 6 mg via SUBCUTANEOUS

## 2021-06-27 MED ORDER — HEPARIN SOD (PORK) LOCK FLUSH 100 UNIT/ML IV SOLN
500.0000 [IU] | Freq: Once | INTRAVENOUS | Status: AC
  Administered 2021-06-27: 500 [IU]
  Filled 2021-06-27: qty 5

## 2021-06-27 MED ORDER — PEGFILGRASTIM-JMDB 6 MG/0.6ML ~~LOC~~ SOSY
PREFILLED_SYRINGE | SUBCUTANEOUS | Status: AC
  Filled 2021-06-27: qty 0.6

## 2021-06-27 MED ORDER — SODIUM CHLORIDE 0.9% FLUSH
10.0000 mL | Freq: Once | INTRAVENOUS | Status: AC
  Administered 2021-06-27: 10 mL
  Filled 2021-06-27: qty 10

## 2021-06-27 NOTE — Patient Instructions (Signed)

## 2021-06-27 NOTE — Telephone Encounter (Signed)
Scheduled per 07/13 los, patient has been called and notified of all upcoming appointments.

## 2021-07-01 ENCOUNTER — Telehealth: Payer: Self-pay | Admitting: *Deleted

## 2021-07-01 NOTE — Telephone Encounter (Signed)
Tora Kindred, RPH  Wilmon Arms, RN; [facility]; Earl Lagos,  There is a scanned document in the PA referral notes that shows that only 1 cycle (06/27/21) was authorized.  If she is to get anymore Fulphila, authorization will have to be obtained.   Velna Hatchet, is this going to be the case for this pt?  Each time will need to be authorized? Or can all future doses get authorized at the same time?   Thanks,  Vergia Alcon         Previous Messages    ----- Message -----  From: Wilmon Arms, RN  Sent: 06/30/2021  12:30 PM EDT  To: Wilmon Arms, RN, [facility]  Subject: Prior Authorization for                         Patient got [id number] 6 mg on 06/27/2021 and now is a part of her treatment plan.  She got a call from her insurance company saying that it needed prior authorization to be covered.  She already got the injection.  Can someone please look into this for the past date 06/27/2021 and for all future injections.   Thanks,  Maudie Mercury

## 2021-07-03 ENCOUNTER — Other Ambulatory Visit: Payer: Self-pay | Admitting: Osteopathic Medicine

## 2021-07-04 ENCOUNTER — Other Ambulatory Visit: Payer: Self-pay | Admitting: Osteopathic Medicine

## 2021-07-04 ENCOUNTER — Other Ambulatory Visit (HOSPITAL_COMMUNITY): Payer: Self-pay

## 2021-07-07 ENCOUNTER — Other Ambulatory Visit: Payer: Self-pay | Admitting: Osteopathic Medicine

## 2021-07-07 ENCOUNTER — Other Ambulatory Visit (HOSPITAL_COMMUNITY): Payer: Self-pay

## 2021-07-08 ENCOUNTER — Other Ambulatory Visit (HOSPITAL_COMMUNITY): Payer: Self-pay

## 2021-07-08 MED ORDER — SIMVASTATIN 20 MG PO TABS
ORAL_TABLET | Freq: Every day | ORAL | 3 refills | Status: DC
  Filled 2021-07-08: qty 90, 90d supply, fill #0
  Filled 2021-10-28: qty 90, 90d supply, fill #1
  Filled 2022-01-26: qty 90, 90d supply, fill #2
  Filled 2022-05-12: qty 90, 90d supply, fill #3

## 2021-07-08 MED ORDER — CLONAZEPAM 0.5 MG PO TABS
ORAL_TABLET | Freq: Every day | ORAL | 0 refills | Status: DC
  Filled 2021-07-08: qty 30, 30d supply, fill #0

## 2021-07-09 ENCOUNTER — Inpatient Hospital Stay (HOSPITAL_BASED_OUTPATIENT_CLINIC_OR_DEPARTMENT_OTHER): Payer: No Typology Code available for payment source | Admitting: Physician Assistant

## 2021-07-09 ENCOUNTER — Inpatient Hospital Stay: Payer: No Typology Code available for payment source

## 2021-07-09 ENCOUNTER — Other Ambulatory Visit: Payer: Self-pay

## 2021-07-09 VITALS — BP 171/82 | HR 73 | Temp 97.9°F | Resp 18 | Ht 66.0 in | Wt 179.8 lb

## 2021-07-09 DIAGNOSIS — C669 Malignant neoplasm of unspecified ureter: Secondary | ICD-10-CM | POA: Diagnosis not present

## 2021-07-09 DIAGNOSIS — C801 Malignant (primary) neoplasm, unspecified: Secondary | ICD-10-CM

## 2021-07-09 DIAGNOSIS — D701 Agranulocytosis secondary to cancer chemotherapy: Secondary | ICD-10-CM

## 2021-07-09 DIAGNOSIS — Z5111 Encounter for antineoplastic chemotherapy: Secondary | ICD-10-CM | POA: Diagnosis not present

## 2021-07-09 DIAGNOSIS — D630 Anemia in neoplastic disease: Secondary | ICD-10-CM | POA: Diagnosis not present

## 2021-07-09 DIAGNOSIS — Z95828 Presence of other vascular implants and grafts: Secondary | ICD-10-CM

## 2021-07-09 LAB — CMP (CANCER CENTER ONLY)
ALT: 9 U/L (ref 0–44)
AST: 18 U/L (ref 15–41)
Albumin: 3.5 g/dL (ref 3.5–5.0)
Alkaline Phosphatase: 122 U/L (ref 38–126)
Anion gap: 10 (ref 5–15)
BUN: 18 mg/dL (ref 8–23)
CO2: 24 mmol/L (ref 22–32)
Calcium: 9.3 mg/dL (ref 8.9–10.3)
Chloride: 106 mmol/L (ref 98–111)
Creatinine: 1.15 mg/dL — ABNORMAL HIGH (ref 0.44–1.00)
GFR, Estimated: 54 mL/min — ABNORMAL LOW (ref >60)
Glucose, Bld: 102 mg/dL — ABNORMAL HIGH (ref 70–99)
Potassium: 4.2 mmol/L (ref 3.5–5.1)
Sodium: 140 mmol/L (ref 135–145)
Total Bilirubin: 0.3 mg/dL (ref 0.3–1.2)
Total Protein: 7.2 g/dL (ref 6.5–8.1)

## 2021-07-09 LAB — CBC WITH DIFFERENTIAL (CANCER CENTER ONLY)
Abs Immature Granulocytes: 1.23 10*3/uL — ABNORMAL HIGH (ref 0.00–0.07)
Basophils Absolute: 0.1 10*3/uL (ref 0.0–0.1)
Basophils Relative: 1 %
Eosinophils Absolute: 0.5 10*3/uL (ref 0.0–0.5)
Eosinophils Relative: 3 %
HCT: 31 % — ABNORMAL LOW (ref 36.0–46.0)
Hemoglobin: 10 g/dL — ABNORMAL LOW (ref 12.0–15.0)
Immature Granulocytes: 8 %
Lymphocytes Relative: 19 %
Lymphs Abs: 2.9 10*3/uL (ref 0.7–4.0)
MCH: 32.5 pg (ref 26.0–34.0)
MCHC: 32.3 g/dL (ref 30.0–36.0)
MCV: 100.6 fL — ABNORMAL HIGH (ref 80.0–100.0)
Monocytes Absolute: 1.4 10*3/uL — ABNORMAL HIGH (ref 0.1–1.0)
Monocytes Relative: 9 %
Neutro Abs: 8.9 10*3/uL — ABNORMAL HIGH (ref 1.7–7.7)
Neutrophils Relative %: 60 %
Platelet Count: 129 10*3/uL — ABNORMAL LOW (ref 150–400)
RBC: 3.08 MIL/uL — ABNORMAL LOW (ref 3.87–5.11)
RDW: 17.7 % — ABNORMAL HIGH (ref 11.5–15.5)
WBC Count: 14.9 10*3/uL — ABNORMAL HIGH (ref 4.0–10.5)
nRBC: 0.4 % — ABNORMAL HIGH (ref 0.0–0.2)

## 2021-07-09 LAB — SAMPLE TO BLOOD BANK

## 2021-07-09 MED ORDER — SODIUM CHLORIDE 0.9% FLUSH
10.0000 mL | Freq: Once | INTRAVENOUS | Status: AC
Start: 2021-07-09 — End: 2021-07-09
  Administered 2021-07-09: 10 mL
  Filled 2021-07-09: qty 10

## 2021-07-09 MED ORDER — PALONOSETRON HCL INJECTION 0.25 MG/5ML
INTRAVENOUS | Status: AC
  Filled 2021-07-09: qty 5

## 2021-07-09 MED ORDER — SODIUM CHLORIDE 0.9 % IV SOLN
2480.0000 mg/m2 | INTRAVENOUS | Status: DC
  Administered 2021-07-09: 5000 mg via INTRAVENOUS
  Filled 2021-07-09: qty 100

## 2021-07-09 MED ORDER — DEXTROSE 5 % IV SOLN
Freq: Once | INTRAVENOUS | Status: AC
  Filled 2021-07-09: qty 250

## 2021-07-09 MED ORDER — SODIUM CHLORIDE 0.9 % IV SOLN
10.0000 mg | Freq: Once | INTRAVENOUS | Status: AC
  Administered 2021-07-09: 10 mg via INTRAVENOUS
  Filled 2021-07-09: qty 10

## 2021-07-09 MED ORDER — OXALIPLATIN CHEMO INJECTION 100 MG/20ML
85.0000 mg/m2 | Freq: Once | INTRAVENOUS | Status: AC
  Administered 2021-07-09: 170 mg via INTRAVENOUS
  Filled 2021-07-09: qty 34

## 2021-07-09 MED ORDER — LEUCOVORIN CALCIUM INJECTION 350 MG
400.0000 mg/m2 | Freq: Once | INTRAVENOUS | Status: AC
  Administered 2021-07-09: 804 mg via INTRAVENOUS
  Filled 2021-07-09: qty 40.2

## 2021-07-09 MED ORDER — PALONOSETRON HCL INJECTION 0.25 MG/5ML
0.2500 mg | Freq: Once | INTRAVENOUS | Status: AC
  Administered 2021-07-09: 0.25 mg via INTRAVENOUS

## 2021-07-09 MED ORDER — SODIUM CHLORIDE 0.9 % IV SOLN: 2400.0000 mg/m2 | INTRAVENOUS | Status: DC

## 2021-07-09 MED ORDER — FLUOROURACIL CHEMO INJECTION 2.5 GM/50ML
400.0000 mg/m2 | Freq: Once | INTRAVENOUS | Status: AC
  Administered 2021-07-09: 800 mg via INTRAVENOUS
  Filled 2021-07-09: qty 16

## 2021-07-09 NOTE — Progress Notes (Signed)
Denies anyHematology and Oncology Follow Up Visit  Mary Stark [id number] Oct 20, 1956 65 y.o. 07/09/2021 10:45 AM [name], DOAlexander, [place], DO   [name]: 65 year old woman with bladder cancer diagnosed in November 2021.  She to have stage IV adenocarcinoma with pelvic adenopathy.   Prior Therapy: She is status post retroperitoneal lymph node biopsy completed on 10/24/2020 which showed metastatic carcinoma.  She is status post repeat cystoscopy and bilateral ureteral stent exchange as well as resection of a bladder tumor on January 21, 2021.  A final pathology showed an adenocarcinoma.  Chemotherapy utilizing carboplatin and gemcitabine started on November 22, 2020.  She completed 6 cycles of therapy on March 06, 2021.  Current therapy: FOLFOX chemotherapy started on Apr 15, 2021.  She is due for Cycle 6, Day 1 of therapy today.    Interim History: Mary Stark is here for a follow-up visit. Her energy levels are relatively stable. She experiences fatigue for approximately 3-4 days after 5FU pump disconnect. She tries to complete her ADLs on her own. Patient's appetite and weight are steady. She reports some nausea that improves with prescribed antiemetics. She deneis any vomiting episodes. Patient denies abdominal pain and has regular bowel movements. She denies easy bruising or signs of bleeding including hematuria. Patient has cold sensitivity without any residual neuropathy. Patient reports occasional episodes of a sore throat but denies any oral ulcerations. She has been compliant with using baking soda and salt water rinses. Patient denies any fevers, chills, night sweats, chest pain or cough. She has no other complaints.   Medications: Unchanged on review. Current Outpatient Medications  Medication Sig Dispense Refill   acetaminophen (TYLENOL) 500 MG tablet Take 1,000 mg by mouth every 8 (eight) hours as needed for moderate pain.      albuterol (PROAIR HFA)  108 (90 Base) MCG/ACT inhaler INHALE 1 TO 2 PUFFS BY MOUTH INTO THE LUNGS EVERY 4 HOURS AS NEEDED FOR WHEEZING OR SHORTNESS OF BREATH 8.5 g 1   amLODipine (NORVASC) 5 MG tablet Take 5 mg by mouth daily.     ARIPiprazole (ABILIFY) 10 MG tablet TAKE 1 TABLET BY MOUTH ONCE DAILY 90 tablet 0   ARIPiprazole 10 MG TABS Take 10 mg by mouth daily. 90 tablet 1   ascorbic acid (VITAMIN C) 500 MG tablet Take 1,000 mg by mouth every evening.      buPROPion (WELLBUTRIN XL) 300 MG 24 hr tablet TAKE 1 TABLET BY MOUTH EVERY DAY 30 tablet 3   cephALEXin (KEFLEX) 250 MG capsule TAKE ONE CAPSULE BY MOUTH NIGHTLY 90 capsule 1   Cholecalciferol (VITAMIN D) 125 MCG (5000 UT) CAPS Take 5,000 Units by mouth every evening.      clonazePAM (KLONOPIN) 0.5 MG tablet TAKE 1 TABLET BY MOUTH AT BEDTIME 30 tablet 0   Coenzyme Q10 (CO Q-10) 200 MG CAPS Take 200 mg by mouth every evening.      gabapentin (NEURONTIN) 300 MG capsule TAKE 1 CAPSULE BY MOUTH IN THE MORNING, 1 CAP IN THE AFTERNOON & 4 CAPS AT NIGHT 180 capsule 3   GLUCOSAMINE-CHONDROITIN PO Take 2 tablets by mouth every evening.      lidocaine-prilocaine (EMLA) cream Apply 1 application topically as needed. 30 g 0   megestrol (MEGACE) 40 MG tablet Take 1 tablet (40 mg total) by mouth 2 (two) times daily. 60 tablet 1   Meth-Hyo-M Bl-Na Phos-Ph Sal (URIBEL) 118 MG CAPS Take one capsule by mouth 3 times daily 30 capsule 3   mirabegron  ER (MYRBETRIQ) 25 MG TB24 tablet TAKE 1 TABLET BY MOUTH ONCE A DAY 30 tablet 3   Nebivolol HCl 20 MG TABS Take 0.5 tablets (10 mg total) by mouth daily. 90 tablet 2   Omega-3 Fatty Acids (FISH OIL) 1200 MG CAPS Take 1,200 mg by mouth every evening.      omeprazole (PRILOSEC) 20 MG capsule Take 1 capsule (20 mg total) by mouth daily. 90 capsule 3   ondansetron (ZOFRAN) 4 MG tablet Take 1 tablet (4 mg total) by mouth every 8 (eight) hours as needed for nausea or vomiting. 40 tablet 3   oxybutynin (DITROPAN) 5 MG tablet TAKE 1 TABLET BY MOUTH  EVERY 8 HOURS AS NEEDED 90 tablet 3   oxymetazoline (AFRIN) 0.05 % nasal spray Place 1 spray into both nostrils 2 (two) times daily as needed for congestion.     Phenazopyridine HCl (AZO URINARY PAIN PO) Take 1 tablet by mouth 3 (three) times daily as needed (bladder spasms).     simvastatin (ZOCOR) 20 MG tablet TAKE 1 TABLET (20 MG TOTAL) BY MOUTH DAILY. 90 tablet 3   tamsulosin (FLOMAX) 0.4 MG CAPS capsule TAKE 1 CAPSULE BY MOUTH AT BEDTIME 30 capsule 3   thiamine 100 MG tablet Take 1 tablet (100 mg total) by mouth daily. 30 tablet 0   vitamin B-12 (CYANOCOBALAMIN) 1000 MCG tablet Take 2,000 mcg by mouth every evening.      Vitamin E 180 MG (400 UNIT) CAPS Take 400 Units by mouth every evening.      No current facility-administered medications for this visit.     Allergies:  Allergies  Allergen Reactions   Metformin And Related Other (See Comments)    Lactic acidosis    Penicillins Hives and Rash    Reports hives to penicillin and amoxicillin as an adult "a long time ago." Has tolerated cefdinir (09/2020) and cefepime (10/2020) before.      Physical Exam:  Blood pressure (!) 171/82, pulse 73, temperature 97.9 F (36.6 C), temperature source Oral, resp. rate 18, height '5\' 6"'$  (1.676 m), weight 179 lb 12.8 oz (81.6 kg), SpO2 97 %.  ECOG: 1  General appearance: No acute distress.  Head: Normocephalic without any trauma Oropharynx: Mucous membranes are moist and pink without any thrush or ulcers. Eyes: Pupils are equal and round reactive to light. Lymph nodes: No cervical, supraclavicular lymphadenopathy.   Heart:regular rate and rhythm.  S1 and S2 without leg edema. Lung: Clear without any rhonchi or wheezes.  No dullness to percussion. Abdomin: Soft, nontender, nondistended with good bowel sounds.  No hepatosplenomegaly. Musculoskeletal: No joint deformity or effusion.  Full range of motion noted. Neurological: No deficits noted on motor, sensory and deep tendon reflex  exam. Skin: No petechial rash or dryness.  Appeared moist.    Lab Results: Lab Results  Component Value Date   WBC 14.9 (H) 07/09/2021   HGB 10.0 (L) 07/09/2021   HCT 31.0 (L) 07/09/2021   MCV 100.6 (H) 07/09/2021   PLT 129 (L) 07/09/2021     Chemistry      Component Value Date/Time   NA 142 06/25/2021 1140   NA 142 04/26/2018 1333   K 4.7 06/25/2021 1140   CL 109 06/25/2021 1140   CO2 26 06/25/2021 1140   BUN 20 06/25/2021 1140   BUN 22 04/26/2018 1333   CREATININE 1.08 (H) 06/25/2021 1140   CREATININE 1.32 (H) 06/04/2021 0000   GLU 87 02/07/2013 0000      Component  Value Date/Time   CALCIUM 9.2 06/25/2021 1140   ALKPHOS 92 06/25/2021 1140   AST 14 (L) 06/25/2021 1140   ALT 9 06/25/2021 1140   BILITOT <0.2 (L) 06/25/2021 1140       Impression and Plan: 65 year old woman with:   1.    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 -She is currently receiving palliative chemotherapy in the form of FOLFOX.  After 4 cycles of therapy, there was reasonable response based on imaging studies in June 2022.   -Cycle 5 of chemotherapy was delayed because of cytopenia. -Patient returns today, 07/09/2021, for Cycle 6. Labs from today were reviewed without any intervention needed. Patient is tolerating treatment without any significant limitations.She will proceed with treatment today as planned and return in two weeks to Dr. Alen Blew prior to Cycle 7. Plan to repeat staging scans after cycle 8.  2.  IV access: Port-A-Cath currently in use without any issues.   3.  Antiemetics: Zofran is available to her without any worsening nausea or vomiting.   4.  Renal function surveillance: We will continue to monitor kidney function on platinum therapy.  Creatinine level is 1.15 today, overall stable.    5.  Goals of care: Her disease is incurable although aggressive measures are warranted at this time.  6.  Anemia: Related to malignancy and  chemotherapy.  Hemoglobin has decreased slightly to 10.0 today. Monitor for now.   7.  Neutropenia: Related to chemotherapy.  Growth factor support will be added to each cycle of chemotherapy. ANC is 8.9 so okay to proceed with treatment today.   8. Thrombocytopenia: Related to chemotherapy. Today's platelet count is 129K, okay to proceed with treatment today. Patient denies any signs of bleeding.   9. Follow-up: In 2 weeks for the next cycle of therapy.  Patient expressed understanding and satisfaction with the plan provided.   I have spent a total of 25 minutes minutes of face-to-face and non-face-to-face time, preparing to see the patient, obtaining and/or reviewing separately obtained history, performing a medically appropriate examination, counseling and educating the patient, documenting clinical information in the electronic health record, and care coordination.   Lincoln Brigham, PA-C Hematology and [place] at [facility]

## 2021-07-11 ENCOUNTER — Other Ambulatory Visit: Payer: Self-pay

## 2021-07-11 ENCOUNTER — Inpatient Hospital Stay: Payer: No Typology Code available for payment source

## 2021-07-11 VITALS — BP 152/96 | HR 70 | Temp 98.0°F | Resp 18

## 2021-07-11 DIAGNOSIS — C669 Malignant neoplasm of unspecified ureter: Secondary | ICD-10-CM

## 2021-07-11 DIAGNOSIS — Z5111 Encounter for antineoplastic chemotherapy: Secondary | ICD-10-CM | POA: Diagnosis not present

## 2021-07-11 DIAGNOSIS — C801 Malignant (primary) neoplasm, unspecified: Secondary | ICD-10-CM

## 2021-07-11 MED ORDER — HEPARIN SOD (PORK) LOCK FLUSH 100 UNIT/ML IV SOLN
500.0000 [IU] | Freq: Once | INTRAVENOUS | Status: AC | PRN
  Administered 2021-07-11: 500 [IU]
  Filled 2021-07-11: qty 5

## 2021-07-11 MED ORDER — PEGFILGRASTIM-JMDB 6 MG/0.6ML ~~LOC~~ SOSY
PREFILLED_SYRINGE | SUBCUTANEOUS | Status: AC
  Filled 2021-07-11: qty 0.6

## 2021-07-11 MED ORDER — PEGFILGRASTIM-JMDB 6 MG/0.6ML ~~LOC~~ SOSY
6.0000 mg | PREFILLED_SYRINGE | Freq: Once | SUBCUTANEOUS | Status: AC
  Administered 2021-07-11: 6 mg via SUBCUTANEOUS

## 2021-07-11 MED ORDER — SODIUM CHLORIDE 0.9% FLUSH
10.0000 mL | INTRAVENOUS | Status: DC | PRN
  Administered 2021-07-11: 10 mL
  Filled 2021-07-11: qty 10

## 2021-07-11 NOTE — Patient Instructions (Signed)

## 2021-07-20 ENCOUNTER — Other Ambulatory Visit: Payer: Self-pay

## 2021-07-20 ENCOUNTER — Encounter: Payer: Self-pay | Admitting: Emergency Medicine

## 2021-07-20 ENCOUNTER — Emergency Department
Admission: EM | Admit: 2021-07-20 | Discharge: 2021-07-20 | Disposition: A | Discharge status: Home / Self Care | Payer: No Typology Code available for payment source | Source: Home / Self Care

## 2021-07-20 ENCOUNTER — Emergency Department (INDEPENDENT_AMBULATORY_CARE_PROVIDER_SITE_OTHER): Payer: No Typology Code available for payment source

## 2021-07-20 DIAGNOSIS — T451X5A Adverse effect of antineoplastic and immunosuppressive drugs, initial encounter: Secondary | ICD-10-CM

## 2021-07-20 DIAGNOSIS — C7911 Secondary malignant neoplasm of bladder: Secondary | ICD-10-CM | POA: Diagnosis not present

## 2021-07-20 DIAGNOSIS — D701 Agranulocytosis secondary to cancer chemotherapy: Secondary | ICD-10-CM | POA: Diagnosis not present

## 2021-07-20 DIAGNOSIS — R059 Cough, unspecified: Secondary | ICD-10-CM

## 2021-07-20 DIAGNOSIS — R509 Fever, unspecified: Secondary | ICD-10-CM | POA: Diagnosis not present

## 2021-07-20 DIAGNOSIS — J189 Pneumonia, unspecified organism: Secondary | ICD-10-CM | POA: Diagnosis not present

## 2021-07-20 MED ORDER — CEFDINIR 300 MG PO CAPS: 600.0000 mg | ORAL_CAPSULE | Freq: Every day | ORAL | 0 refills | Status: DC

## 2021-07-20 MED ORDER — BENZONATATE 200 MG PO CAPS: 200.0000 mg | ORAL_CAPSULE | Freq: Two times a day (BID) | ORAL | 0 refills | Status: DC | PRN

## 2021-07-20 MED ORDER — AZITHROMYCIN 250 MG PO TABS: 250.0000 mg | ORAL_TABLET | Freq: Every day | ORAL | 0 refills | Status: DC

## 2021-07-20 NOTE — ED Provider Notes (Signed)
[facility]    CSN: [id number] Arrival date & time: 07/20/21  1507      History   Chief Complaint Chief Complaint  Patient presents with   Cough    HPI Mary Stark is a 65 y.o. female.   HPI  Patient is here for cough.  Is progressively worsening over 5 days.  She is feeling very tired.  Her chest hurts when she coughs.  It does not hurt when she takes a deep breath.  She has a decreased appetite and has not been eating.  She is trying to drink enough fluids.  She did run a fever at home.  It was up to 100.5.  She did a COVID test that was negative.  She is fully vaccinated.  No one else at home is sick.  No runny nose sore throat or other respiratory symptoms noted. Patient has metastatic adenocarcinoma of the bladder.  Is actively receiving chemotherapy.  She states she is on medicine to prevent anemia and neutropenia from her chemotherapy.  Her doctors have told her that she may be at increased risk of infections during this time. Medical chart is reviewed. Patient also has hypertension and hyperlipidemia, chronic kidney disease.  Anxiety and arthritis.  She sees a primary care physician is compliant with her medical care.  Past Medical History:  Diagnosis Date   Adenocarcinoma of bladder, stage 4 (Normal) 10/2020   oncologist--- dr Alen Blew and urologist-- dr pace;  w/ mets , started chemo 12/ 2021 and bilateral ureteral stents to treat hydronephrosis   Alcohol dependence ([place])    Anemia due to chemotherapy    Anxiety    Arthritis    Back and lt knee   CKD (chronic kidney disease), stage III ([place])    Family history of adverse reaction to anesthesia    father had a "tight" airway   GERD (gastroesophageal reflux disease)    History of 2019 novel coronavirus disease (COVID-19) 08/13/2020   positive result in epic 08-19-2020,  hospital admission in epic,  covid pneumonia with hypoxia respiratory failure, discharged with oxygen for 6 weeks   History of chemotherapy  last done 05-13-2021   History of colitis 03/2018   acute colitis with sepsis   History of hepatitis C    TX FOR 2012   History of kidney stones    History of rheumatic fever as a child    per pt no valvular issues   History of sepsis 10/2020   hospital admission in epic due to pyelonephritis due to hydronephrosis, resolved   History of skin cancer    excision from nose   Hyperlipidemia    Hypertension    MDD (major depressive disorder)    Seasonal allergies    Vitamin D deficiency     Patient Active Problem List   Diagnosis Date Noted   Encounter for antineoplastic chemotherapy 07/09/2021   UTI (urinary tract infection) 05/23/2021   Right lower quadrant pain 01/01/2021   Incontinence of feces 01/01/2021   Abnormal findings on diagnostic imaging of other abdominal regions, including retroperitoneum 01/01/2021   Change in bowel habit 01/01/2021   Hemorrhoids without complication [id number]   Impingement syndrome, shoulder, left 11/27/2020   Port-A-Cath in place 11/22/2020   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   Malignancy ([place]) 10/31/2020   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   Sepsis secondary to UTI ([place]) 10/22/2020   Normocytic anemia 10/22/2020   Hyperproteinemia 10/22/2020   Hydronephrosis 10/01/2020   Proteinuria 10/01/2020  Abnormal CT of the abdomen 10/01/2020   Bilateral lower extremity edema 09/12/2020   Acute hypoxemic respiratory failure due to COVID-19 [facility]) 08/19/2020   Tear of medial meniscus of left knee, current 07/13/2019   Internal derangement of knee involving posterior horn of lateral meniscus, left 07/13/2019   Primary osteoarthritis of both knees 07/13/2019   Renal insufficiency 07/13/2019   Anxiety about health 03/22/2019   Hypokalemia 03/21/2019   Essential hypertension 02/13/2019   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/2018   Anemia due to blood loss, acute 04/14/2018   Hypomagnesemia 04/14/2018   Hypocalcemia 04/14/2018   Tobacco abuse 04/08/2018    Depression 04/07/2018   AKI (acute kidney injury) ([place]) 04/07/2018   Sepsis ([place]) 04/07/2018   Acute colitis 04/07/2018   Avascular necrosis of humeral head, 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   NSAID long-term use 03/30/2018   Goals of care, counseling/discussion 03/30/2018   Seborrheic keratoses 03/15/2018   History of nonmelanoma skin cancer 03/02/2018   Skin lesion of chest wall 03/02/2018   Alcohol dependence with unspecified alcohol-induced disorder ([place]) 02/11/2018   Severe episode of recurrent major depressive disorder, without psychotic features ([place]) 02/11/2018   Transaminitis 01/06/2018   Nicotine dependence 01/06/2018   Heavy alcohol consumption 01/06/2018   Encounter for monitoring statin therapy 01/06/2018   Class 1 obesity due to excess calories with serious comorbidity in adult 01/06/2018   Chronic pain syndrome 04/21/2017   Chronic right shoulder pain 07/03/2015   Vaginismus 04/12/2014   Menopausal state 11/08/2013   Family history of ovarian cancer 09/27/2013   Postmenopausal atrophic vaginitis 09/12/2013   Personal history of colonic polyps 07/18/2013   Dyslipidemia, goal LDL below 100 07/17/2013   Depression with anxiety 07/17/2013   Hx of hepatitis C 07/17/2013   Vitamin D deficiency 07/17/2013   Right lumbar radiculopathy 07/17/2013   History of alcohol dependence ([place]) 07/17/2013   H/O: rheumatic fever 06/30/2012   Insomnia 06/30/2012   Personal history of other infectious and parasitic diseases 06/30/2012    Past Surgical History:  Procedure Laterality Date   ANAL RECTAL MANOMETRY N/A 01/31/2020   Procedure: ANO RECTAL MANOMETRY;  Surgeon: Arta Silence, MD;  Location: [facility];  Service: Endoscopy;  Laterality: N/A;   CESAREAN SECTION  1984   CYSTOSCOPY W/ URETERAL STENT PLACEMENT Bilateral 01/21/2021   Procedure: CYSTOSCOPY WITH STENT REPLACEMENT;  Surgeon: Robley Fries, MD;  Location: [facility];  Service: Urology;  Laterality:  Bilateral;  1 HR   CYSTOSCOPY W/ URETERAL STENT PLACEMENT Bilateral 05/20/2021   Procedure: CYSTOSCOPY WITH RETROGRADE PYELOGRAM/URETERAL STENT EXCHANGE;  Surgeon: Robley Fries, MD;  Location: [facility];  Service: Urology;  Laterality: Bilateral;   CYSTOSCOPY WITH RETROGRADE PYELOGRAM, URETEROSCOPY AND STENT PLACEMENT Bilateral 06/11/2020   Procedure: CYSTOSCOPY WITH RETROGRADE PYELOGRAM, URETEROSCOPY AND STENT PLACEMENT, RIGHT URETERAL BIOPSY;  Surgeon: Robley Fries, MD;  Location: [facility];  Service: Urology;  Laterality: Bilateral;  57 MINS   CYSTOSCOPY WITH RETROGRADE PYELOGRAM, URETEROSCOPY AND STENT PLACEMENT Bilateral 07/05/2020   Procedure: CYSTOSCOPY WITH RETROGRADE PYELOGRAM, URETEROSCOPY,  BIOPSIES AND STENT EXCHANGES;  Surgeon: Robley Fries, MD;  Location: [facility];  Service: Urology;  Laterality: Bilateral;   FINGER SURGERY Right    5th   GYNECOLOGIC CRYOSURGERY  YRS AGO   IR IMAGING GUIDED PORT INSERTION  11/18/2020   TONSILLECTOMY  AS CHILD   TRANSURETHRAL RESECTION OF BLADDER TUMOR  01/21/2021   Procedure: TRANSURETHRAL RESECTION OF BLADDER TUMOR (TURBT);  Surgeon: Robley Fries, MD;  Location: [place];  Service: Urology;;    OB History   No obstetric history on file.      Home Medications    Prior to Admission medications   Medication Sig Start Date End Date Taking? Authorizing Provider  acetaminophen (TYLENOL) 500 MG tablet Take 1,000 mg by mouth every 8 (eight) hours as needed for moderate pain.    Yes [provider]  albuterol (PROAIR HFA) 108 (90 Base) MCG/ACT inhaler INHALE 1 TO 2 PUFFS BY MOUTH INTO THE LUNGS EVERY 4 HOURS AS NEEDED FOR WHEEZING OR SHORTNESS OF BREATH 08/28/20  Yes [name], DO  amLODipine (NORVASC) 5 MG tablet Take 5 mg by mouth daily.   Yes [provider]  ARIPiprazole (ABILIFY) 10 MG tablet TAKE 1 TABLET BY MOUTH ONCE DAILY 06/05/21 06/05/22 Yes [name], DO  ARIPiprazole 10 MG TABS Take 10 mg by mouth daily. 11/28/20  Yes [name], DO  ascorbic acid (VITAMIN C) 500 MG tablet Take 1,000 mg by mouth every evening.    Yes [provider]  azithromycin (ZITHROMAX) 250 MG tablet Take 1 tablet (250 mg total) by mouth daily. Take first 2 tablets together, then 1 every day until finished. 07/20/21  Yes Raylene Everts, MD  benzonatate (TESSALON) 200 MG capsule Take 1 capsule (200 mg total) by mouth 2 (two) times daily as needed for cough. 07/20/21  Yes Raylene Everts, MD  buPROPion (WELLBUTRIN XL) 300 MG 24 hr tablet TAKE 1 TABLET BY MOUTH EVERY DAY 04/21/21 04/21/22 Yes [name], DO  cefdinir (OMNICEF) 300 MG capsule Take 2 capsules (600 mg total) by mouth daily. 07/20/21  Yes Raylene Everts, MD  cephALEXin (KEFLEX) 250 MG capsule TAKE ONE CAPSULE BY MOUTH NIGHTLY 06/06/21  Yes   Cholecalciferol (VITAMIN D) 125 MCG (5000 UT) CAPS Take 5,000 Units by mouth every evening.    Yes [provider]  clonazePAM (KLONOPIN) 0.5 MG tablet TAKE 1 TABLET BY MOUTH AT BEDTIME 07/08/21 01/04/22 Yes [name], DO  Coenzyme Q10 (CO Q-10) 200 MG CAPS Take 200 mg by mouth every evening.    Yes [provider]  gabapentin (NEURONTIN) 300 MG capsule TAKE 1 CAPSULE BY MOUTH IN THE MORNING, 1 CAP IN THE AFTERNOON & 4 CAPS AT NIGHT 05/07/21 05/07/22 Yes Alexander, [name], DO  GLUCOSAMINE-CHONDROITIN PO Take 2 tablets by mouth every evening.    Yes [provider]  megestrol (MEGACE) 40 MG tablet Take 1 tablet (40 mg total) by mouth 2 (two) times daily. 06/04/21  Yes [name], DO  Meth-Hyo-M Bl-Na Phos-Ph Sal (URIBEL) 118 MG CAPS Take one capsule by mouth 3 times daily 06/06/21  Yes   mirabegron ER (MYRBETRIQ) 25 MG TB24 tablet TAKE 1 TABLET BY MOUTH ONCE A DAY 04/21/21 04/21/22 Yes   Nebivolol HCl 20 MG TABS Take 0.5 tablets (10 mg total) by mouth daily. 05/27/21 05/27/22 Yes Shelly Coss, MD  Omega-3  Fatty Acids (FISH OIL) 1200 MG CAPS Take 1,200 mg by mouth every evening.    Yes [provider]  omeprazole (PRILOSEC) 20 MG capsule Take 1 capsule (20 mg total) by mouth daily. 06/23/21 06/23/22 Yes Alexander, [name], DO  ondansetron (ZOFRAN) 4 MG tablet Take 1 tablet (4 mg total) by mouth every 8 (eight) hours as needed for nausea or vomiting. 04/30/21  Yes Wyatt Portela, MD  oxybutynin (DITROPAN) 5 MG tablet TAKE 1 TABLET BY MOUTH EVERY 8 HOURS AS NEEDED 05/20/21 05/20/22 Yes  Phenazopyridine HCl (AZO URINARY PAIN PO) Take 1 tablet by mouth 3 (three) times daily as needed (bladder spasms).   Yes [provider]  simvastatin (ZOCOR) 20 MG tablet TAKE 1 TABLET (20 MG TOTAL) BY MOUTH DAILY. 07/08/21 07/08/22 Yes [name], DO  tamsulosin (FLOMAX) 0.4 MG CAPS capsule TAKE 1 CAPSULE BY MOUTH AT BEDTIME 04/21/21 04/21/22 Yes   thiamine 100 MG tablet Take 1 tablet (100 mg total) by mouth daily. 10/26/20  Yes Bonnielee Haff, MD  vitamin B-12 (CYANOCOBALAMIN) 1000 MCG tablet Take 2,000 mcg by mouth every evening.    Yes [provider]  Vitamin E 180 MG (400 UNIT) CAPS Take 400 Units by mouth every evening.    Yes [provider]  lidocaine-prilocaine (EMLA) cream Apply 1 application topically as needed. 11/06/20   Wyatt Portela, MD  oxymetazoline (AFRIN) 0.05 % nasal spray Place 1 spray into both nostrils 2 (two) times daily as needed for congestion.    [provider]    Family History Family History  Problem Relation Age of Onset   Ovarian cancer Mother    Cardiomyopathy Father    Valvular heart disease Father    Liver cancer Brother    Protein C deficiency Brother    Protein C deficiency Brother    Stroke Brother     Social History Social History   Tobacco Use   Smoking status: Former    Packs/day: 1.00    Years: 25.00    Pack years: 25.00    Types: Cigarettes    Quit date: 2012    Years since quitting: 10.6   Smokeless tobacco:  Never  Vaping Use   Vaping Use: Every day   Substances: Nicotine  Substance Use Topics   Alcohol use: Yes    Alcohol/week: 24.0 - 36.0 standard drinks    Types: 24 - 36 Cans of beer per week    Comment: occ wine   Drug use: No     Allergies   Metformin and related and Penicillins   Review of Systems Review of Systems See HPI  Physical Exam Triage Vital Signs ED Triage Vitals  Enc Vitals Group     BP 07/20/21 1529 111/73     Pulse Rate 07/20/21 1529 75     Resp 07/20/21 1529 16     Temp 07/20/21 1529 98.8 F (37.1 C)     Temp Source 07/20/21 1529 Oral     SpO2 07/20/21 1529 93 %     Weight 07/20/21 1532 178 lb (80.7 kg)     Height 07/20/21 1532 '5\' 6"'$  (1.676 m)     Head Circumference --      Peak Flow --      Pain Score 07/20/21 1532 0     Pain Loc --      Pain Edu? --      Excl. in [place]? --    No data found.  Updated Vital Signs BP 111/73 (BP Location: Left Arm)   Pulse 75   Temp 98.8 F (37.1 C) (Oral)   Resp 16   Ht '5\' 6"'$  (1.676 m)   Wt 80.7 kg   SpO2 93%   BMI 28.73 kg/m      Physical Exam Constitutional:      General: She is not in acute distress.    Appearance: She is well-developed. She is ill-appearing.     Comments: Appears tired  HENT:     Head: Normocephalic and atraumatic.  Right Ear: Tympanic membrane and ear canal normal.     Left Ear: Tympanic membrane and ear canal normal.     Nose: Nose normal. No congestion.     Mouth/Throat:     Mouth: Mucous membranes are moist.     Pharynx: No posterior oropharyngeal erythema.  Eyes:     Conjunctiva/sclera: Conjunctivae normal.     Pupils: Pupils are equal, round, and reactive to light.  Cardiovascular:     Rate and Rhythm: Normal rate and regular rhythm.     Heart sounds: Normal heart sounds.  Pulmonary:     Effort: Pulmonary effort is normal. No respiratory distress.     Breath sounds: Rales present. No wheezing or rhonchi.     Comments: Scattered rales both bases, right  axilla Abdominal:     General: There is no distension.     Palpations: Abdomen is soft.  Musculoskeletal:        General: Normal range of motion.     Cervical back: Normal range of motion and neck supple.  Lymphadenopathy:     Cervical: No cervical adenopathy.  Skin:    General: Skin is warm and dry.  Neurological:     Mental Status: She is alert.  Psychiatric:        Mood and Affect: Mood normal.        Behavior: Behavior normal.     UC Treatments / Results  Labs (all labs ordered are listed, but only abnormal results are displayed) Labs Reviewed - No data to display  EKG   Radiology DG Chest 2 View  Result Date: 07/20/2021 CLINICAL DATA:  Cough, fever EXAM: CHEST - 2 VIEW COMPARISON:  05/23/2021 FINDINGS: The heart size and mediastinal contours are within normal limits. Right chest port catheter. Irregular heterogeneous airspace opacity peripheral right upper lobe as well the lung bases, new compared to prior examination. The visualized skeletal structures are unremarkable. Partially imaged ureteral stents in the included upper abdomen. IMPRESSION: Irregular heterogeneous airspace opacity peripheral right upper lobe as well as the lung bases, concerning for infection. Electronically Signed   By: Eddie Candle M.D.   On: 07/20/2021 16:31    Procedures Procedures (including critical care time)  Medications Ordered in UC Medications - No data to display  Initial Impression / Assessment and Plan / UC Course  I have reviewed the triage vital signs and the nursing notes.  Pertinent labs & imaging results that were available during my care of the patient were reviewed by me and considered in my medical decision making (see chart for details).     I discussed with patient that she does have pneumonia.  She has a penicillin allergy.  She has taken cephalosporins successfully.  She denies any history of COPD or lung disease.  I did look at her old chest CT from April 2022.  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  I reviewed with her that it is not unreasonable to consider going her to the hospital to have blood work and additional evaluation to determine the safety of treating her at home.  Patient is tired and wishes to go home on antibiotics, and will call her oncologist in the morning to see about additional testing.  It is emphasized to her to go to the emergency room if she feels worse instead of better at any time.  Her vital signs are stable with good oxygenation.  She is not in respiratory distress  at the time of discharge Final Clinical Impressions(s) / UC Diagnoses   Final diagnoses:  Community acquired pneumonia, unspecified laterality  Chemotherapy induced neutropenia ([place])     Discharge Instructions      Home to rest Drink lots of fluids Take 2 antibiotics for your pneumonia.  One of them is a Z-Pak (2 pills today then 1 a day until gone) The other is cefdinir (Omnicef) 300 mg pill, 2 pills a day until gone Take Tessalon 2-3 times a day as needed for cough Call your oncologist tomorrow for additional advice.  They may want blood work  If you are worse instead of better at any time you must go to an emergency room   ED Prescriptions     Medication Sig Dispense Auth. Provider   cefdinir (OMNICEF) 300 MG capsule Take 2 capsules (600 mg total) by mouth daily. 14 capsule Raylene Everts, MD   azithromycin (ZITHROMAX) 250 MG tablet Take 1 tablet (250 mg total) by mouth daily. Take first 2 tablets together, then 1 every day until finished. 6 tablet Raylene Everts, MD   benzonatate (TESSALON) 200 MG capsule Take 1 capsule (200 mg total) by mouth 2 (two) times daily as needed for cough. 20 capsule Raylene Everts, MD      PDMP not reviewed this encounter.   Raylene Everts, MD 07/20/21 [phone]

## 2021-07-20 NOTE — Discharge Instructions (Addendum)
Home to rest Drink lots of fluids Take 2 antibiotics for your pneumonia.  One of them is a Z-Pak (2 pills today then 1 a day until gone) The other is cefdinir (Omnicef) 300 mg pill, 2 pills a day until gone Take Tessalon 2-3 times a day as needed for cough Call your oncologist tomorrow for additional advice.  They may want blood work  If you are worse instead of better at any time you must go to an emergency room

## 2021-07-20 NOTE — ED Triage Notes (Signed)
Patient states she began coughing on Monday, has progressively gotten worse, home COVID test was negative yesterday.  Patient has ran a low grade fever.  Patient has taken Mucinex DM.  Patient is vaccinated for COVID.

## 2021-07-21 ENCOUNTER — Ambulatory Visit: Payer: Self-pay

## 2021-07-21 ENCOUNTER — Telehealth: Payer: No Typology Code available for payment source | Admitting: Family Medicine

## 2021-07-21 ENCOUNTER — Telehealth: Payer: Self-pay | Admitting: *Deleted

## 2021-07-21 NOTE — Telephone Encounter (Signed)
Patient called to report she was diagnosed with pneumonia on August 7th, symptoms started 07/14/2021.  She states she did a home COVID test and it was negative and no other ones done since.    She went to Urgent Care yesterday and was diagnosed with Pneumonia.  She is on antibiotic and tessalon pearles.  She denies having a fever.  Reports severe cough.   Routing to MD to advise if patient is ok to come in on Wednesday for her visit and infusion of Folfox.

## 2021-07-22 ENCOUNTER — Other Ambulatory Visit (HOSPITAL_COMMUNITY): Payer: Self-pay

## 2021-07-22 NOTE — Telephone Encounter (Signed)
Communicated response to patient.

## 2021-07-23 ENCOUNTER — Other Ambulatory Visit: Payer: Self-pay

## 2021-07-23 ENCOUNTER — Inpatient Hospital Stay (HOSPITAL_BASED_OUTPATIENT_CLINIC_OR_DEPARTMENT_OTHER): Payer: No Typology Code available for payment source | Admitting: Oncology

## 2021-07-23 ENCOUNTER — Inpatient Hospital Stay: Payer: No Typology Code available for payment source | Attending: Oncology

## 2021-07-23 ENCOUNTER — Inpatient Hospital Stay: Payer: No Typology Code available for payment source

## 2021-07-23 VITALS — BP 138/84 | HR 72 | Temp 97.7°F | Resp 18 | Wt 174.4 lb

## 2021-07-23 DIAGNOSIS — Z5111 Encounter for antineoplastic chemotherapy: Secondary | ICD-10-CM | POA: Diagnosis not present

## 2021-07-23 DIAGNOSIS — D701 Agranulocytosis secondary to cancer chemotherapy: Secondary | ICD-10-CM | POA: Diagnosis not present

## 2021-07-23 DIAGNOSIS — C801 Malignant (primary) neoplasm, unspecified: Secondary | ICD-10-CM

## 2021-07-23 DIAGNOSIS — C669 Malignant neoplasm of unspecified ureter: Secondary | ICD-10-CM

## 2021-07-23 DIAGNOSIS — Z95828 Presence of other vascular implants and grafts: Secondary | ICD-10-CM

## 2021-07-23 DIAGNOSIS — Z5189 Encounter for other specified aftercare: Secondary | ICD-10-CM | POA: Diagnosis not present

## 2021-07-23 DIAGNOSIS — D630 Anemia in neoplastic disease: Secondary | ICD-10-CM | POA: Insufficient documentation

## 2021-07-23 LAB — CMP (CANCER CENTER ONLY)
ALT: 13 U/L (ref 0–44)
AST: 20 U/L (ref 15–41)
Albumin: 3.1 g/dL — ABNORMAL LOW (ref 3.5–5.0)
Alkaline Phosphatase: 133 U/L — ABNORMAL HIGH (ref 38–126)
Anion gap: 11 (ref 5–15)
BUN: 14 mg/dL (ref 8–23)
CO2: 19 mmol/L — ABNORMAL LOW (ref 22–32)
Calcium: 9.1 mg/dL (ref 8.9–10.3)
Chloride: 105 mmol/L (ref 98–111)
Creatinine: 1.21 mg/dL — ABNORMAL HIGH (ref 0.44–1.00)
GFR, Estimated: 50 mL/min — ABNORMAL LOW (ref >60)
Glucose, Bld: 101 mg/dL — ABNORMAL HIGH (ref 70–99)
Potassium: 3.9 mmol/L (ref 3.5–5.1)
Sodium: 135 mmol/L (ref 135–145)
Total Bilirubin: 0.4 mg/dL (ref 0.3–1.2)
Total Protein: 7.3 g/dL (ref 6.5–8.1)

## 2021-07-23 LAB — CBC WITH DIFFERENTIAL (CANCER CENTER ONLY)
Abs Immature Granulocytes: 2.98 10*3/uL — ABNORMAL HIGH (ref 0.00–0.07)
Basophils Absolute: 0.1 10*3/uL (ref 0.0–0.1)
Basophils Relative: 0 %
Eosinophils Absolute: 0.1 10*3/uL (ref 0.0–0.5)
Eosinophils Relative: 0 %
HCT: 24.4 % — ABNORMAL LOW (ref 36.0–46.0)
Hemoglobin: 7.9 g/dL — ABNORMAL LOW (ref 12.0–15.0)
Immature Granulocytes: 15 %
Lymphocytes Relative: 12 %
Lymphs Abs: 2.5 10*3/uL (ref 0.7–4.0)
MCH: 33.1 pg (ref 26.0–34.0)
MCHC: 32.4 g/dL (ref 30.0–36.0)
MCV: 102.1 fL — ABNORMAL HIGH (ref 80.0–100.0)
Monocytes Absolute: 1.7 10*3/uL — ABNORMAL HIGH (ref 0.1–1.0)
Monocytes Relative: 9 %
Neutro Abs: 12.9 10*3/uL — ABNORMAL HIGH (ref 1.7–7.7)
Neutrophils Relative %: 64 %
Platelet Count: 153 10*3/uL (ref 150–400)
RBC: 2.39 MIL/uL — ABNORMAL LOW (ref 3.87–5.11)
RDW: 18.6 % — ABNORMAL HIGH (ref 11.5–15.5)
WBC Count: 20.2 10*3/uL — ABNORMAL HIGH (ref 4.0–10.5)
nRBC: 1 % — ABNORMAL HIGH (ref 0.0–0.2)

## 2021-07-23 MED ORDER — OXALIPLATIN CHEMO INJECTION 100 MG/20ML
85.0000 mg/m2 | Freq: Once | INTRAVENOUS | Status: AC
  Administered 2021-07-23: 170 mg via INTRAVENOUS
  Filled 2021-07-23: qty 34

## 2021-07-23 MED ORDER — SODIUM CHLORIDE 0.9 % IV SOLN
10.0000 mg | Freq: Once | INTRAVENOUS | Status: AC
  Administered 2021-07-23: 10 mg via INTRAVENOUS
  Filled 2021-07-23: qty 10

## 2021-07-23 MED ORDER — SODIUM CHLORIDE 0.9% FLUSH
10.0000 mL | INTRAVENOUS | Status: DC | PRN
  Filled 2021-07-23: qty 10

## 2021-07-23 MED ORDER — DEXTROSE 5 % IV SOLN
Freq: Once | INTRAVENOUS | Status: AC
  Filled 2021-07-23: qty 250

## 2021-07-23 MED ORDER — FLUOROURACIL CHEMO INJECTION 2.5 GM/50ML
400.0000 mg/m2 | Freq: Once | INTRAVENOUS | Status: AC
  Administered 2021-07-23: 800 mg via INTRAVENOUS
  Filled 2021-07-23: qty 16

## 2021-07-23 MED ORDER — HEPARIN SOD (PORK) LOCK FLUSH 100 UNIT/ML IV SOLN
500.0000 [IU] | Freq: Once | INTRAVENOUS | Status: DC | PRN
  Filled 2021-07-23: qty 5

## 2021-07-23 MED ORDER — SODIUM CHLORIDE 0.9% FLUSH
10.0000 mL | Freq: Once | INTRAVENOUS | Status: AC
  Administered 2021-07-23: 10 mL
  Filled 2021-07-23: qty 10

## 2021-07-23 MED ORDER — PALONOSETRON HCL INJECTION 0.25 MG/5ML
0.2500 mg | Freq: Once | INTRAVENOUS | Status: AC
  Administered 2021-07-23: 0.25 mg via INTRAVENOUS
  Filled 2021-07-23: qty 5

## 2021-07-23 MED ORDER — DEXTROSE 5 % IV SOLN
400.0000 mg/m2 | Freq: Once | INTRAVENOUS | Status: AC
  Administered 2021-07-23: 804 mg via INTRAVENOUS
  Filled 2021-07-23: qty 40.2

## 2021-07-23 MED ORDER — SODIUM CHLORIDE 0.9 % IV SOLN
5000.0000 mg | INTRAVENOUS | Status: DC
  Administered 2021-07-23: 5000 mg via INTRAVENOUS
  Filled 2021-07-23: qty 100

## 2021-07-23 NOTE — Progress Notes (Signed)
Denies anyHematology and Oncology Follow Up Visit  Mary Stark [id number] [birth year] 65 y.o. 07/23/2021 11:25 AM Mary Stark, DOAlexander, [name], DO   [name]: 65 year old woman with stage IV adenocarcinoma of the bladder diagnosed in 2021.  She presented with pelvic adenopathy and advanced disease.   Prior Therapy:  She is status post retroperitoneal lymph node biopsy completed on 10/24/2020 which showed metastatic carcinoma.  She is status post repeat cystoscopy and bilateral ureteral stent exchange as well as resection of a bladder tumor on January 21, 2021.  A final pathology showed an adenocarcinoma.  Chemotherapy utilizing carboplatin and gemcitabine started on November 22, 2020.  She completed 6 cycles of therapy on March 06, 2021.  Current therapy: FOLFOX chemotherapy started on Apr 15, 2021.  He is here for cycle 7 of therapy.   Interim History: [name] presents today for return evaluation.  Since the last visit, she reports feeling better today without any major complaints.  She is recovering from recent bronchitis and possible pneumonia episode and currently on antibiotics.  He does report cough that is becoming less productive.  She denies any fevers, chills or sweats.  Her performance status quality of life remained about the same.  She does report some mild fatigue and tiredness but no shortness of breath or difficulty breathing.  She denies any worsening neuropathy or cold sensitivity.     Medications: Updated on review. Current Outpatient Medications  Medication Sig Dispense Refill   acetaminophen (TYLENOL) 500 MG tablet Take 1,000 mg by mouth every 8 (eight) hours as needed for moderate pain.      albuterol (PROAIR HFA) 108 (90 Base) MCG/ACT inhaler INHALE 1 TO 2 PUFFS BY MOUTH INTO THE LUNGS EVERY 4 HOURS AS NEEDED FOR WHEEZING OR SHORTNESS OF BREATH 8.5 g 1   amLODipine (NORVASC) 5 MG tablet Take 5 mg by mouth daily.     ARIPiprazole  (ABILIFY) 10 MG tablet TAKE 1 TABLET BY MOUTH ONCE DAILY 90 tablet 0   ARIPiprazole 10 MG TABS Take 10 mg by mouth daily. 90 tablet 1   ascorbic acid (VITAMIN C) 500 MG tablet Take 1,000 mg by mouth every evening.      azithromycin (ZITHROMAX) 250 MG tablet Take 1 tablet (250 mg total) by mouth daily. Take first 2 tablets together, then 1 every day until finished. 6 tablet 0   benzonatate (TESSALON) 200 MG capsule Take 1 capsule (200 mg total) by mouth 2 (two) times daily as needed for cough. 20 capsule 0   buPROPion (WELLBUTRIN XL) 300 MG 24 hr tablet TAKE 1 TABLET BY MOUTH EVERY DAY 30 tablet 3   cefdinir (OMNICEF) 300 MG capsule Take 2 capsules (600 mg total) by mouth daily. 14 capsule 0   cephALEXin (KEFLEX) 250 MG capsule TAKE ONE CAPSULE BY MOUTH NIGHTLY 90 capsule 1   Cholecalciferol (VITAMIN D) 125 MCG (5000 UT) CAPS Take 5,000 Units by mouth every evening.      clonazePAM (KLONOPIN) 0.5 MG tablet TAKE 1 TABLET BY MOUTH AT BEDTIME 30 tablet 0   Coenzyme Q10 (CO Q-10) 200 MG CAPS Take 200 mg by mouth every evening.      gabapentin (NEURONTIN) 300 MG capsule TAKE 1 CAPSULE BY MOUTH IN THE MORNING, 1 CAP IN THE AFTERNOON & 4 CAPS AT NIGHT 180 capsule 3   GLUCOSAMINE-CHONDROITIN PO Take 2 tablets by mouth every evening.      lidocaine-prilocaine (EMLA) cream Apply 1 application topically as needed. 30 g 0  megestrol (MEGACE) 40 MG tablet Take 1 tablet (40 mg total) by mouth 2 (two) times daily. 60 tablet 1   Meth-Hyo-M Bl-Na Phos-Ph Sal (URIBEL) 118 MG CAPS Take one capsule by mouth 3 times daily 30 capsule 3   mirabegron ER (MYRBETRIQ) 25 MG TB24 tablet TAKE 1 TABLET BY MOUTH ONCE A DAY 30 tablet 3   Nebivolol HCl 20 MG TABS Take 0.5 tablets (10 mg total) by mouth daily. 90 tablet 2   Omega-3 Fatty Acids (FISH OIL) 1200 MG CAPS Take 1,200 mg by mouth every evening.      omeprazole (PRILOSEC) 20 MG capsule Take 1 capsule (20 mg total) by mouth daily. 90 capsule 3   ondansetron (ZOFRAN) 4 MG  tablet Take 1 tablet (4 mg total) by mouth every 8 (eight) hours as needed for nausea or vomiting. 40 tablet 3   oxybutynin (DITROPAN) 5 MG tablet TAKE 1 TABLET BY MOUTH EVERY 8 HOURS AS NEEDED 90 tablet 3   oxymetazoline (AFRIN) 0.05 % nasal spray Place 1 spray into both nostrils 2 (two) times daily as needed for congestion.     Phenazopyridine HCl (AZO URINARY PAIN PO) Take 1 tablet by mouth 3 (three) times daily as needed (bladder spasms).     simvastatin (ZOCOR) 20 MG tablet TAKE 1 TABLET (20 MG TOTAL) BY MOUTH DAILY. 90 tablet 3   tamsulosin (FLOMAX) 0.4 MG CAPS capsule TAKE 1 CAPSULE BY MOUTH AT BEDTIME 30 capsule 3   thiamine 100 MG tablet Take 1 tablet (100 mg total) by mouth daily. 30 tablet 0   vitamin B-12 (CYANOCOBALAMIN) 1000 MCG tablet Take 2,000 mcg by mouth every evening.      Vitamin E 180 MG (400 UNIT) CAPS Take 400 Units by mouth every evening.      No current facility-administered medications for this visit.     Allergies:  Allergies  Allergen Reactions   Metformin And Related Other (See Comments)    Lactic acidosis    Penicillins Hives and Rash    Reports hives to penicillin and amoxicillin as an adult "a long time ago." Has tolerated cefdinir (09/2020) and cefepime (10/2020) before.      Physical Exam:     Blood pressure 138/84, pulse 72, temperature 97.7 F (36.5 C), temperature source Tympanic, resp. rate 18, weight 174 lb 7 oz (79.1 kg), SpO2 100 %.       ECOG: 1   General appearance: Alert, awake without any distress. Head: Atraumatic without abnormalities Oropharynx: Without any thrush or ulcers. Eyes: No scleral icterus. Lymph nodes: No lymphadenopathy noted in the cervical, supraclavicular, or axillary nodes Heart:regular rate and rhythm, without any murmurs or gallops.   Lung: Clear to auscultation without any rhonchi, wheezes or dullness to percussion. Abdomin: Soft, nontender without any shifting dullness or ascites. Musculoskeletal: No  clubbing or cyanosis. Neurological: No motor or sensory deficits. Skin: No rashes or lesions.               Lab Results: Lab Results  Component Value Date   WBC 14.9 (H) 07/09/2021   HGB 10.0 (L) 07/09/2021   HCT 31.0 (L) 07/09/2021   MCV 100.6 (H) 07/09/2021   PLT 129 (L) 07/09/2021     Chemistry      Component Value Date/Time   NA 140 07/09/2021 1004   NA 142 04/26/2018 1333   K 4.2 07/09/2021 1004   CL 106 07/09/2021 1004   CO2 24 07/09/2021 1004   BUN 18 07/09/2021 1004  BUN 22 04/26/2018 1333   CREATININE 1.15 (H) 07/09/2021 1004   CREATININE 1.32 (H) 06/04/2021 0000   GLU 87 02/07/2013 0000      Component Value Date/Time   CALCIUM 9.3 07/09/2021 1004   ALKPHOS 122 07/09/2021 1004   AST 18 07/09/2021 1004   ALT 9 07/09/2021 1004   BILITOT 0.3 07/09/2021 1004       Impression and Plan:    65 year old woman with:   1.    Bladder cancer diagnosed in November 2021.  She presented with stage IV adenocarcinoma of the bladder with pelvic adenopathy.   Her disease status was updated at this time with treatment choices were discussed.  Continues to tolerate FOLFOX chemotherapy without any major complaints.  Risks and benefits of proceeding with treatment today were discussed.  Patient to include nausea, vomiting, mild suppression, neutropenia and possible sepsis were reviewed.  After discussion today she is agreeable to proceed with plan to update her staging scans after the next cycle of therapy.   2.  IV access: Port-A-Cath remains in use without any issues.   3.  Antiemetics: No nausea or vomiting noted at this time.  Zofran is available to her.   4.  Renal function surveillance: Creatinine clearance remains close to baseline without any decline.   5.  Goals of care: Therapy remains palliative though aggressive measures are warranted given her excellent performance status.   6.  Anemia: Hemoglobin is declining slowly after recent transfusion.   Her anemia is related to malignancy and chemotherapy.  Possible transfusion will be needed for the time being she opted to defer and will continue to evaluate.  7.  Neutropenia: Related to chemotherapy and will receive growth factor support after each cycle of treatment.   8.  Follow-up: She will return in 2 weeks for the next cycle of therapy.   30  minutes were dedicated to this encounter.  Time was spent on reviewing laboratory data, disease status update, addressing complications related to cancer and cancer therapy.   Zola Button, MD 8/10/202211:25 AM

## 2021-07-23 NOTE — Patient Instructions (Signed)
[facility]  Discharge Instructions: Thank you for choosing [facility] to provide your oncology and hematology care.   If you have a lab appointment with the [facility], please go directly to the [facility] and check in at the registration area.   Wear comfortable clothing and clothing appropriate for easy access to any Portacath or PICC line.   We strive to give you quality time with your provider. You may need to reschedule your appointment if you arrive late (15 or more minutes).  Arriving late affects you and other patients whose appointments are after yours.  Also, if you miss three or more appointments without notifying the office, you may be dismissed from the clinic at the provider's discretion.      For prescription refill requests, have your pharmacy contact our office and allow 72 hours for refills to be completed.    Today you received the following chemotherapy and/or immunotherapy agents fluorourcil, oxaliplatin, leucovorin    To help prevent nausea and vomiting after your treatment, we encourage you to take your nausea medication as directed.  BELOW ARE SYMPTOMS THAT SHOULD BE REPORTED IMMEDIATELY: *FEVER GREATER THAN 100.4 F (38 C) OR HIGHER *CHILLS OR SWEATING *NAUSEA AND VOMITING THAT IS NOT CONTROLLED WITH YOUR NAUSEA MEDICATION *UNUSUAL SHORTNESS OF BREATH *UNUSUAL BRUISING OR BLEEDING *URINARY PROBLEMS (pain or burning when urinating, or frequent urination) *BOWEL PROBLEMS (unusual diarrhea, constipation, pain near the anus) TENDERNESS IN MOUTH AND THROAT WITH OR WITHOUT PRESENCE OF ULCERS (sore throat, sores in mouth, or a toothache) UNUSUAL RASH, SWELLING OR PAIN  UNUSUAL VAGINAL DISCHARGE OR ITCHING   Items with * indicate a potential emergency and should be followed up as soon as possible or go to the Emergency Department if any problems should occur.  Please show the CHEMOTHERAPY ALERT CARD or IMMUNOTHERAPY  ALERT CARD at check-in to the Emergency Department and triage nurse.  Should you have questions after your visit or need to cancel or reschedule your appointment, please contact [facility]  Dept: [phone]  and follow the prompts.  Office hours are 8:00 a.m. to 4:30 p.m. Monday - Friday. Please note that voicemails left after 4:00 p.m. may not be returned until the following business day.  We are closed weekends and major holidays. You have access to a nurse at all times for urgent questions. Please call the main number to the clinic Dept: [phone] and follow the prompts.   For any non-urgent questions, you may also contact your provider using MyChart. We now offer e-Visits for anyone 64 and older to request care online for non-urgent symptoms. For details visit [email or web address].   Also download the MyChart app! Go to the app store, search "MyChart", open the app, select [facility], and log in with your MyChart username and password.  Due to Covid, a mask is required upon entering the hospital/clinic. If you do not have a mask, one will be given to you upon arrival. For doctor visits, patients may have 1 support person aged 64 or older with them. For treatment visits, patients cannot have anyone with them due to current Covid guidelines and our immunocompromised population.

## 2021-07-23 NOTE — Addendum Note (Signed)
Addended by: Wyatt Portela on: 07/23/2021 01:03 PM   Modules accepted: Orders

## 2021-07-25 ENCOUNTER — Inpatient Hospital Stay: Payer: No Typology Code available for payment source

## 2021-07-25 ENCOUNTER — Other Ambulatory Visit: Payer: Self-pay

## 2021-07-25 VITALS — BP 129/76 | HR 86 | Temp 97.9°F | Resp 18

## 2021-07-25 DIAGNOSIS — Z5111 Encounter for antineoplastic chemotherapy: Secondary | ICD-10-CM | POA: Diagnosis not present

## 2021-07-25 DIAGNOSIS — Z95828 Presence of other vascular implants and grafts: Secondary | ICD-10-CM

## 2021-07-25 DIAGNOSIS — C669 Malignant neoplasm of unspecified ureter: Secondary | ICD-10-CM

## 2021-07-25 DIAGNOSIS — C801 Malignant (primary) neoplasm, unspecified: Secondary | ICD-10-CM

## 2021-07-25 MED ORDER — PEGFILGRASTIM-JMDB 6 MG/0.6ML ~~LOC~~ SOSY
6.0000 mg | PREFILLED_SYRINGE | Freq: Once | SUBCUTANEOUS | Status: AC
  Administered 2021-07-25: 6 mg via SUBCUTANEOUS
  Filled 2021-07-25: qty 0.6

## 2021-07-25 MED ORDER — HEPARIN SOD (PORK) LOCK FLUSH 100 UNIT/ML IV SOLN
500.0000 [IU] | Freq: Once | INTRAVENOUS | Status: AC
  Administered 2021-07-25: 500 [IU]
  Filled 2021-07-25: qty 5

## 2021-07-25 MED ORDER — SODIUM CHLORIDE 0.9% FLUSH
10.0000 mL | Freq: Once | INTRAVENOUS | Status: AC
  Administered 2021-07-25: 10 mL
  Filled 2021-07-25: qty 10

## 2021-07-28 ENCOUNTER — Inpatient Hospital Stay: Payer: No Typology Code available for payment source

## 2021-07-28 ENCOUNTER — Other Ambulatory Visit: Payer: Self-pay

## 2021-07-28 ENCOUNTER — Other Ambulatory Visit: Payer: Self-pay | Admitting: *Deleted

## 2021-07-28 DIAGNOSIS — C669 Malignant neoplasm of unspecified ureter: Secondary | ICD-10-CM

## 2021-07-28 DIAGNOSIS — Z5111 Encounter for antineoplastic chemotherapy: Secondary | ICD-10-CM | POA: Diagnosis not present

## 2021-07-28 LAB — SAMPLE TO BLOOD BANK

## 2021-07-28 LAB — CMP (CANCER CENTER ONLY)
ALT: 10 U/L (ref 0–44)
AST: 27 U/L (ref 15–41)
Albumin: 3.3 g/dL — ABNORMAL LOW (ref 3.5–5.0)
Alkaline Phosphatase: 183 U/L — ABNORMAL HIGH (ref 38–126)
Anion gap: 9 (ref 5–15)
BUN: 24 mg/dL — ABNORMAL HIGH (ref 8–23)
CO2: 26 mmol/L (ref 22–32)
Calcium: 9.2 mg/dL (ref 8.9–10.3)
Chloride: 105 mmol/L (ref 98–111)
Creatinine: 1.07 mg/dL — ABNORMAL HIGH (ref 0.44–1.00)
GFR, Estimated: 58 mL/min — ABNORMAL LOW (ref >60)
Glucose, Bld: 102 mg/dL — ABNORMAL HIGH (ref 70–99)
Potassium: 4.6 mmol/L (ref 3.5–5.1)
Sodium: 140 mmol/L (ref 135–145)
Total Bilirubin: 0.6 mg/dL (ref 0.3–1.2)
Total Protein: 7.2 g/dL (ref 6.5–8.1)

## 2021-07-28 LAB — CBC WITH DIFFERENTIAL (CANCER CENTER ONLY)
Abs Immature Granulocytes: 2.6 10*3/uL — ABNORMAL HIGH (ref 0.00–0.07)
Basophils Absolute: 0.2 10*3/uL — ABNORMAL HIGH (ref 0.0–0.1)
Basophils Relative: 1 %
Eosinophils Absolute: 0.1 10*3/uL (ref 0.0–0.5)
Eosinophils Relative: 0 %
HCT: 25.5 % — ABNORMAL LOW (ref 36.0–46.0)
Hemoglobin: 8 g/dL — ABNORMAL LOW (ref 12.0–15.0)
Immature Granulocytes: 8 %
Lymphocytes Relative: 6 %
Lymphs Abs: 1.9 10*3/uL (ref 0.7–4.0)
MCH: 32.7 pg (ref 26.0–34.0)
MCHC: 31.4 g/dL (ref 30.0–36.0)
MCV: 104.1 fL — ABNORMAL HIGH (ref 80.0–100.0)
Monocytes Absolute: 0.2 10*3/uL (ref 0.1–1.0)
Monocytes Relative: 1 %
Neutro Abs: 27.1 10*3/uL — ABNORMAL HIGH (ref 1.7–7.7)
Neutrophils Relative %: 84 %
Platelet Count: 95 10*3/uL — ABNORMAL LOW (ref 150–400)
RBC: 2.45 MIL/uL — ABNORMAL LOW (ref 3.87–5.11)
RDW: 18.3 % — ABNORMAL HIGH (ref 11.5–15.5)
WBC Count: 32 10*3/uL — ABNORMAL HIGH (ref 4.0–10.5)
nRBC: 0 % (ref 0.0–0.2)

## 2021-07-28 LAB — PREPARE RBC (CROSSMATCH)

## 2021-07-28 MED ORDER — HEPARIN SOD (PORK) LOCK FLUSH 100 UNIT/ML IV SOLN
500.0000 [IU] | Freq: Every day | INTRAVENOUS | Status: AC | PRN
  Administered 2021-07-28: 500 [IU]

## 2021-07-28 MED ORDER — DIPHENHYDRAMINE HCL 25 MG PO CAPS
25.0000 mg | ORAL_CAPSULE | Freq: Once | ORAL | Status: AC
  Administered 2021-07-28: 25 mg via ORAL
  Filled 2021-07-28: qty 1

## 2021-07-28 MED ORDER — ACETAMINOPHEN 325 MG PO TABS
650.0000 mg | ORAL_TABLET | Freq: Once | ORAL | Status: AC
  Administered 2021-07-28: 650 mg via ORAL
  Filled 2021-07-28: qty 2

## 2021-07-28 MED ORDER — SODIUM CHLORIDE 0.9% IV SOLUTION
250.0000 mL | Freq: Once | INTRAVENOUS | Status: AC
  Administered 2021-07-28: 250 mL via INTRAVENOUS

## 2021-07-28 MED ORDER — SODIUM CHLORIDE 0.9% FLUSH
10.0000 mL | INTRAVENOUS | Status: AC | PRN
  Administered 2021-07-28: 10 mL

## 2021-07-28 NOTE — Patient Instructions (Signed)
Implanted Port Home Guide An implanted port is a device that is placed under the skin. It is usually placed in the chest. The device can be used to give IV medicine, to take blood, or for dialysis. You may have an implanted port if: You need IV medicine that would be irritating to the small veins in your hands or arms. You need IV medicines, such as antibiotics, for a long period of time. You need IV nutrition for a long period of time. You need dialysis. When you have a port, your health care provider can choose to use the port instead of veins in your arms for these procedures. You may have fewer limitations when using a port than you would if you used other types of long-term IVs, and you will likely be able to return to normal activities afteryour incision heals. An implanted port has two main parts: Reservoir. The reservoir is the part where a needle is inserted to give medicines or draw blood. The reservoir is round. After it is placed, it appears as a small, raised area under your skin. Catheter. The catheter is a thin, flexible tube that connects the reservoir to a vein. Medicine that is inserted into the reservoir goes into the catheter and then into the vein. How is my port accessed? To access your port: A numbing cream may be placed on the skin over the port site. Your health care provider will put on a mask and sterile gloves. The skin over your port will be cleaned carefully with a germ-killing soap and allowed to dry. Your health care provider will gently pinch the port and insert a needle into it. Your health care provider will check for a blood return to make sure the port is in the vein and is not clogged. If your port needs to remain accessed to get medicine continuously (constant infusion), your health care provider will place a clear bandage (dressing) over the needle site. The dressing and needle will need to be changed every week, or as told by your health care provider. What  is flushing? Flushing helps keep the port from getting clogged. Follow instructions from your health care provider about how and when to flush the port. Ports are usually flushed with saline solution or a medicine called heparin. The need for flushing will depend on how the port is used: If the port is only used from time to time to give medicines or draw blood, the port may need to be flushed: Before and after medicines have been given. Before and after blood has been drawn. As part of routine maintenance. Flushing may be recommended every 4-6 weeks. If a constant infusion is running, the port may not need to be flushed. Throw away any syringes in a disposal container that is meant for sharp items (sharps container). You can buy a sharps container from a pharmacy, or you can make one by using an empty hard plastic bottle with a cover. How long will my port stay implanted? The port can stay in for as long as your health care provider thinks it is needed. When it is time for the port to come out, a surgery will be done to remove it. The surgery will be similar to the procedure that was done to putthe port in. Follow these instructions at home:  Flush your port as told by your health care provider. If you need an infusion over several days, follow instructions from your health care provider about how to take   care of your port site. Make sure you: Wash your hands with soap and water before you change your dressing. If soap and water are not available, use alcohol-based hand sanitizer. Change your dressing as told by your health care provider. Place any used dressings or infusion bags into a plastic bag. Throw that bag in the trash. Keep the dressing that covers the needle clean and dry. Do not get it wet. Do not use scissors or sharp objects near the tube. Keep the tube clamped, unless it is being used. Check your port site every day for signs of infection. Check for: Redness, swelling, or  pain. Fluid or blood. Pus or a bad smell. Protect the skin around the port site. Avoid wearing bra straps that rub or irritate the site. Protect the skin around your port from seat belts. Place a soft pad over your chest if needed. Bathe or shower as told by your health care provider. The site may get wet as long as you are not actively receiving an infusion. Return to your normal activities as told by your health care provider. Ask your health care provider what activities are safe for you. Carry a medical alert card or wear a medical alert bracelet at all times. This will let health care providers know that you have an implanted port in case of an emergency. Get help right away if: You have redness, swelling, or pain at the port site. You have fluid or blood coming from your port site. You have pus or a bad smell coming from the port site. You have a fever. Summary Implanted ports are usually placed in the chest for long-term IV access. Follow instructions from your health care provider about flushing the port and changing bandages (dressings). Take care of the area around your port by avoiding clothing that puts pressure on the area, and by watching for signs of infection. Protect the skin around your port from seat belts. Place a soft pad over your chest if needed. Get help right away if you have a fever or you have redness, swelling, pain, drainage, or a bad smell at the port site. This information is not intended to replace advice given to you by your health care provider. Make sure you discuss any questions you have with your healthcare provider. Document Revised: 04/15/2020 Document Reviewed: 04/15/2020 Elsevier Patient Education  2022 Elsevier Inc.  

## 2021-07-28 NOTE — Patient Instructions (Signed)
https://www.[facility].org/donate-blood/blood-donation-process/what-happens-to-donated-blood/blood-transfusions/types-of-[email or web address].html"> https://www.[facility].org/donate-blood/blood-donation-process/what-happens-to-donated-blood/blood-transfusions/risks-complications.html">  Blood Transfusion, Adult, Care After This sheet gives you information about how to care for yourself after your procedure. Your health care provider may also give you more specific instructions. If you have problems or questions, contact your health careprovider. What can I expect after the procedure? After the procedure, it is common to have: Bruising and soreness where the IV was inserted. A fever or chills on the day of the procedure. This may be your body's response to the new blood cells received. A headache. Follow these instructions at home: IV insertion site care     Follow instructions from your health care provider about how to take care of your IV insertion site. Make sure you: Wash your hands with soap and water before and after you change your bandage (dressing). If soap and water are not available, use hand sanitizer. Change your dressing as told by your health care provider. Check your IV insertion site every day for signs of infection. Check for: Redness, swelling, or pain. Bleeding from the site. Warmth. Pus or a bad smell. General instructions Take over-the-counter and prescription medicines only as told by your health care provider. Rest as told by your health care provider. Return to your normal activities as told by your health care provider. Keep all follow-up visits as told by your health care provider. This is important. Contact a health care provider if: You have itching or red, swollen areas of skin (hives). You feel anxious. You feel weak after doing your normal activities. You have redness, swelling, warmth, or pain around the IV insertion site. You have blood coming  from the IV insertion site that does not stop with pressure. You have pus or a bad smell coming from your IV insertion site. Get help right away if: You have symptoms of a serious allergic or immune system reaction, including: Trouble breathing or shortness of breath. Swelling of the face or feeling flushed. Fever or chills. Pain in the head, back, or chest. Dark urine or blood in the urine. Widespread rash. Fast heartbeat. Feeling dizzy or light-headed. If you receive your blood transfusion in an outpatient setting, you will betold whom to contact to report any reactions. These symptoms may represent a serious problem that is an emergency. Do not wait to see if the symptoms will go away. Get medical help right away. Call your local emergency services (911 in the [place] Do not drive yourself to the hospital. Summary Bruising and tenderness around the IV insertion site are common. Check your IV insertion site every day for signs of infection. Rest as told by your health care provider. Return to your normal activities as told by your health care provider. Get help right away for symptoms of a serious allergic or immune system reaction to blood transfusion. This information is not intended to replace advice given to you by your health care provider. Make sure you discuss any questions you have with your healthcare provider. Document Revised: 05/25/2019 Document Reviewed: 05/25/2019 Elsevier Patient Education  2022 Elsevier Inc.  

## 2021-07-29 ENCOUNTER — Inpatient Hospital Stay: Payer: No Typology Code available for payment source

## 2021-07-29 ENCOUNTER — Other Ambulatory Visit: Payer: No Typology Code available for payment source

## 2021-07-29 ENCOUNTER — Ambulatory Visit: Payer: No Typology Code available for payment source

## 2021-07-29 ENCOUNTER — Other Ambulatory Visit: Payer: Self-pay | Admitting: *Deleted

## 2021-07-29 DIAGNOSIS — C669 Malignant neoplasm of unspecified ureter: Secondary | ICD-10-CM

## 2021-07-29 DIAGNOSIS — Z5111 Encounter for antineoplastic chemotherapy: Secondary | ICD-10-CM | POA: Diagnosis not present

## 2021-07-29 LAB — PREPARE RBC (CROSSMATCH)

## 2021-07-29 MED ORDER — SODIUM CHLORIDE 0.9% IV SOLUTION
250.0000 mL | Freq: Once | INTRAVENOUS | Status: AC
  Administered 2021-07-29: 250 mL via INTRAVENOUS

## 2021-07-29 MED ORDER — DIPHENHYDRAMINE HCL 25 MG PO CAPS
25.0000 mg | ORAL_CAPSULE | Freq: Once | ORAL | Status: AC
  Administered 2021-07-29: 25 mg via ORAL
  Filled 2021-07-29: qty 1

## 2021-07-29 MED ORDER — HEPARIN SOD (PORK) LOCK FLUSH 100 UNIT/ML IV SOLN: 500.0000 [IU] | Freq: Every day | INTRAVENOUS | Status: DC | PRN

## 2021-07-29 MED ORDER — SODIUM CHLORIDE 0.9% FLUSH: 10.0000 mL | INTRAVENOUS | Status: DC | PRN

## 2021-07-29 MED ORDER — ACETAMINOPHEN 325 MG PO TABS
650.0000 mg | ORAL_TABLET | Freq: Once | ORAL | Status: AC
  Administered 2021-07-29: 650 mg via ORAL
  Filled 2021-07-29: qty 2

## 2021-07-29 NOTE — Patient Instructions (Signed)
Blood Transfusion, Adult, Care After This sheet gives you information about how to care for yourself after your procedure. Your doctor may also give you more specific instructions. If youhave problems or questions, contact your doctor. What can I expect after the procedure? After the procedure, it is common to have: Bruising and soreness at the IV site. A fever or chills on the day of the procedure. This may be your body's response to the new blood cells received. A headache. Follow these instructions at home: Insertion site care     Follow instructions from your doctor about how to take care of your insertion site. This is where an IV tube was put into your vein. Make sure you: Wash your hands with soap and water before and after you change your bandage (dressing). If you cannot use soap and water, use hand sanitizer. Change your bandage as told by your doctor. Check your insertion site every day for signs of infection. Check for: Redness, swelling, or pain. Bleeding from the site. Warmth. Pus or a bad smell. General instructions Take over-the-counter and prescription medicines only as told by your doctor. Rest as told by your doctor. Go back to your normal activities as told by your doctor. Keep all follow-up visits as told by your doctor. This is important. Contact a doctor if: You have itching or red, swollen areas of skin (hives). You feel worried or nervous (anxious). You feel weak after doing your normal activities. You have redness, swelling, warmth, or pain around the insertion site. You have blood coming from the insertion site, and the blood does not stop with pressure. You have pus or a bad smell coming from the insertion site. Get help right away if: You have signs of a serious reaction. This may be coming from an allergy or the body's defense system (immune system). Signs include: Trouble breathing or shortness of breath. Swelling of the face or feeling warm  (flushed). Fever or chills. Head, chest, or back pain. Dark pee (urine) or blood in the pee. Widespread rash. Fast heartbeat. Feeling dizzy or light-headed. You may receive your blood transfusion in an outpatient setting. If so, youwill be told whom to contact to report any reactions. These symptoms may be an emergency. Do not wait to see if the symptoms will go away. Get medical help right away. Call your local emergency services (911 in the [place] Do not drive yourself to the hospital. Summary Bruising and soreness at the IV site are common. Check your insertion site every day for signs of infection. Rest as told by your doctor. Go back to your normal activities as told by your doctor. Get help right away if you have signs of a serious reaction. This information is not intended to replace advice given to you by your health care provider. Make sure you discuss any questions you have with your healthcare provider. Document Revised: 05/25/2019 Document Reviewed: 05/25/2019 Elsevier Patient Education  2022 Elsevier Inc.  

## 2021-07-29 NOTE — Progress Notes (Signed)
Orders placed for 1 unit PRBCs per Dr. Alen Blew to be given today  Premeds: Tylenol 650 mg PO   Benadryl 25 mg PO

## 2021-07-30 LAB — TYPE AND SCREEN
ABO/RH(D): A POS
Antibody Screen: NEGATIVE
Unit division: 0
Unit division: 0

## 2021-07-30 LAB — BPAM RBC
Blood Product Expiration Date: 202209082359
Blood Product Expiration Date: 202209082359
ISSUE DATE / TIME: 202208151441
ISSUE DATE / TIME: 202208161212
Unit Type and Rh: 6200
Unit Type and Rh: 6200

## 2021-08-05 ENCOUNTER — Ambulatory Visit (INDEPENDENT_AMBULATORY_CARE_PROVIDER_SITE_OTHER): Payer: No Typology Code available for payment source | Admitting: Sports Medicine

## 2021-08-05 ENCOUNTER — Ambulatory Visit (INDEPENDENT_AMBULATORY_CARE_PROVIDER_SITE_OTHER): Payer: No Typology Code available for payment source

## 2021-08-05 ENCOUNTER — Other Ambulatory Visit: Payer: Self-pay | Admitting: Osteopathic Medicine

## 2021-08-05 ENCOUNTER — Other Ambulatory Visit (HOSPITAL_COMMUNITY): Payer: Self-pay

## 2021-08-05 ENCOUNTER — Other Ambulatory Visit: Payer: Self-pay

## 2021-08-05 DIAGNOSIS — M170 Bilateral primary osteoarthritis of knee: Secondary | ICD-10-CM

## 2021-08-05 MED ORDER — CLONAZEPAM 0.5 MG PO TABS
ORAL_TABLET | Freq: Every day | ORAL | 0 refills | Status: DC
Start: 2021-08-05 — End: 2021-09-24
  Filled 2021-08-05: qty 30, 30d supply, fill #0

## 2021-08-05 MED FILL — Dexamethasone Sodium Phosphate Inj 100 MG/10ML: INTRAMUSCULAR | Qty: 1 | Status: AC

## 2021-08-05 NOTE — Progress Notes (Signed)
    Procedures performed today:    Procedure: Real-time Ultrasound Guided injection of the left knee Device: Samsung HS60  Verbal informed consent obtained.  Time-out conducted.  Noted no overlying erythema, induration, or other signs of local infection.  Skin prepped in a sterile fashion.  Local anesthesia: Topical Ethyl chloride.  With sterile technique and under real time ultrasound guidance: Only trace effusion noted, 1 cc Kenalog 40, 2 cc lidocaine, 2 cc bupivacaine injected easily Completed without difficulty  Advised to call if fevers/chills, erythema, induration, drainage, or persistent bleeding.  Images permanently stored and available for review in PACS.  Impression: Technically successful ultrasound guided injection.  Procedure: Real-time Ultrasound Guided injection of the right knee Device: Samsung HS60  Verbal informed consent obtained.  Time-out conducted.  Noted no overlying erythema, induration, or other signs of local infection.  Skin prepped in a sterile fashion.  Local anesthesia: Topical Ethyl chloride.  With sterile technique and under real time ultrasound guidance: Only trace effusion noted, 1 cc Kenalog 40, 2 cc lidocaine, 2 cc bupivacaine injected easily Completed without difficulty  Advised to call if fevers/chills, erythema, induration, drainage, or persistent bleeding.  Images permanently stored and available for review in PACS.  Impression: Technically successful ultrasound guided injection.  Independent interpretation of notes and tests performed by another provider:   None.  Brief History, Exam, Impression, and Recommendations:    Primary osteoarthritis of both knees Bilateral knee osteoarthritis, exacerbation of chronic condition, pharmacologic intervention with an injection today, last done in May. Return as needed.    ___________________________________________ Gwen Her. Dianah Field, M.D., ABFM., CAQSM. Primary Care and [place] Instructor of [employer] [facility] [employer]

## 2021-08-05 NOTE — Assessment & Plan Note (Signed)
Bilateral knee osteoarthritis, exacerbation of chronic condition, pharmacologic intervention with an injection today, last done in May. Return as needed.

## 2021-08-06 ENCOUNTER — Inpatient Hospital Stay (HOSPITAL_BASED_OUTPATIENT_CLINIC_OR_DEPARTMENT_OTHER): Payer: No Typology Code available for payment source | Admitting: Oncology

## 2021-08-06 ENCOUNTER — Inpatient Hospital Stay: Payer: No Typology Code available for payment source

## 2021-08-06 VITALS — BP 166/90 | HR 87 | Temp 97.0°F | Resp 18 | Ht 66.0 in | Wt 174.6 lb

## 2021-08-06 DIAGNOSIS — C801 Malignant (primary) neoplasm, unspecified: Secondary | ICD-10-CM

## 2021-08-06 DIAGNOSIS — Z5111 Encounter for antineoplastic chemotherapy: Secondary | ICD-10-CM | POA: Diagnosis not present

## 2021-08-06 DIAGNOSIS — C669 Malignant neoplasm of unspecified ureter: Secondary | ICD-10-CM

## 2021-08-06 DIAGNOSIS — Z95828 Presence of other vascular implants and grafts: Secondary | ICD-10-CM

## 2021-08-06 LAB — CMP (CANCER CENTER ONLY)
ALT: 19 U/L (ref 0–44)
AST: 33 U/L (ref 15–41)
Albumin: 3.6 g/dL (ref 3.5–5.0)
Alkaline Phosphatase: 140 U/L — ABNORMAL HIGH (ref 38–126)
Anion gap: 10 (ref 5–15)
BUN: 20 mg/dL (ref 8–23)
CO2: 21 mmol/L — ABNORMAL LOW (ref 22–32)
Calcium: 9 mg/dL (ref 8.9–10.3)
Chloride: 104 mmol/L (ref 98–111)
Creatinine: 1.36 mg/dL — ABNORMAL HIGH (ref 0.44–1.00)
GFR, Estimated: 44 mL/min — ABNORMAL LOW (ref >60)
Glucose, Bld: 123 mg/dL — ABNORMAL HIGH (ref 70–99)
Potassium: 4.3 mmol/L (ref 3.5–5.1)
Sodium: 135 mmol/L (ref 135–145)
Total Bilirubin: 0.5 mg/dL (ref 0.3–1.2)
Total Protein: 7.5 g/dL (ref 6.5–8.1)

## 2021-08-06 LAB — CBC WITH DIFFERENTIAL (CANCER CENTER ONLY)
Abs Immature Granulocytes: 4.15 10*3/uL — ABNORMAL HIGH (ref 0.00–0.07)
Basophils Absolute: 0 10*3/uL (ref 0.0–0.1)
Basophils Relative: 0 %
Eosinophils Absolute: 0 10*3/uL (ref 0.0–0.5)
Eosinophils Relative: 0 %
HCT: 28.3 % — ABNORMAL LOW (ref 36.0–46.0)
Hemoglobin: 9.4 g/dL — ABNORMAL LOW (ref 12.0–15.0)
Immature Granulocytes: 16 %
Lymphocytes Relative: 9 %
Lymphs Abs: 2.3 10*3/uL (ref 0.7–4.0)
MCH: 33.2 pg (ref 26.0–34.0)
MCHC: 33.2 g/dL (ref 30.0–36.0)
MCV: 100 fL (ref 80.0–100.0)
Monocytes Absolute: 1.7 10*3/uL — ABNORMAL HIGH (ref 0.1–1.0)
Monocytes Relative: 6 %
Neutro Abs: 18.4 10*3/uL — ABNORMAL HIGH (ref 1.7–7.7)
Neutrophils Relative %: 69 %
Platelet Count: 77 10*3/uL — ABNORMAL LOW (ref 150–400)
RBC: 2.83 MIL/uL — ABNORMAL LOW (ref 3.87–5.11)
RDW: 18.6 % — ABNORMAL HIGH (ref 11.5–15.5)
WBC Count: 26.6 10*3/uL — ABNORMAL HIGH (ref 4.0–10.5)
nRBC: 1.3 % — ABNORMAL HIGH (ref 0.0–0.2)

## 2021-08-06 LAB — SAMPLE TO BLOOD BANK

## 2021-08-06 MED ORDER — FLUOROURACIL CHEMO INJECTION 2.5 GM/50ML
400.0000 mg/m2 | Freq: Once | INTRAVENOUS | Status: AC
  Administered 2021-08-06: 800 mg via INTRAVENOUS
  Filled 2021-08-06: qty 16

## 2021-08-06 MED ORDER — SODIUM CHLORIDE 0.9 % IV SOLN
5000.0000 mg | INTRAVENOUS | Status: DC
  Administered 2021-08-06: 5000 mg via INTRAVENOUS
  Filled 2021-08-06: qty 100

## 2021-08-06 MED ORDER — SODIUM CHLORIDE 0.9 % IV SOLN
10.0000 mg | Freq: Once | INTRAVENOUS | Status: AC
  Administered 2021-08-06: 10 mg via INTRAVENOUS
  Filled 2021-08-06: qty 10

## 2021-08-06 MED ORDER — LEUCOVORIN CALCIUM INJECTION 350 MG
400.0000 mg/m2 | Freq: Once | INTRAVENOUS | Status: AC
  Administered 2021-08-06: 804 mg via INTRAVENOUS
  Filled 2021-08-06: qty 40.2

## 2021-08-06 MED ORDER — DEXTROSE 5 % IV SOLN: Freq: Once | INTRAVENOUS | Status: AC

## 2021-08-06 MED ORDER — OXALIPLATIN CHEMO INJECTION 100 MG/20ML
64.0000 mg/m2 | Freq: Once | INTRAVENOUS | Status: AC
  Administered 2021-08-06: 130 mg via INTRAVENOUS
  Filled 2021-08-06: qty 20

## 2021-08-06 MED ORDER — PALONOSETRON HCL INJECTION 0.25 MG/5ML
0.2500 mg | Freq: Once | INTRAVENOUS | Status: AC
  Administered 2021-08-06: 0.25 mg via INTRAVENOUS
  Filled 2021-08-06: qty 5

## 2021-08-06 MED ORDER — SODIUM CHLORIDE 0.9% FLUSH
10.0000 mL | Freq: Once | INTRAVENOUS | Status: AC
  Administered 2021-08-06: 10 mL

## 2021-08-06 NOTE — Patient Instructions (Signed)
[facility]  Discharge Instructions: Thank you for choosing [facility] to provide your oncology and hematology care.   If you have a lab appointment with the [facility], please go directly to the [facility] and check in at the registration area.   Wear comfortable clothing and clothing appropriate for easy access to any Portacath or PICC line.   We strive to give you quality time with your provider. You may need to reschedule your appointment if you arrive late (15 or more minutes).  Arriving late affects you and other patients whose appointments are after yours.  Also, if you miss three or more appointments without notifying the office, you may be dismissed from the clinic at the provider's discretion.      For prescription refill requests, have your pharmacy contact our office and allow 72 hours for refills to be completed.    Today you received the following chemotherapy and/or immunotherapy agents oxaliplatin, leucovorin, fluorourcil.      To help prevent nausea and vomiting after your treatment, we encourage you to take your nausea medication as directed.  BELOW ARE SYMPTOMS THAT SHOULD BE REPORTED IMMEDIATELY: *FEVER GREATER THAN 100.4 F (38 C) OR HIGHER *CHILLS OR SWEATING *NAUSEA AND VOMITING THAT IS NOT CONTROLLED WITH YOUR NAUSEA MEDICATION *UNUSUAL SHORTNESS OF BREATH *UNUSUAL BRUISING OR BLEEDING *URINARY PROBLEMS (pain or burning when urinating, or frequent urination) *BOWEL PROBLEMS (unusual diarrhea, constipation, pain near the anus) TENDERNESS IN MOUTH AND THROAT WITH OR WITHOUT PRESENCE OF ULCERS (sore throat, sores in mouth, or a toothache) UNUSUAL RASH, SWELLING OR PAIN  UNUSUAL VAGINAL DISCHARGE OR ITCHING   Items with * indicate a potential emergency and should be followed up as soon as possible or go to the Emergency Department if any problems should occur.  Please show the CHEMOTHERAPY ALERT CARD or IMMUNOTHERAPY  ALERT CARD at check-in to the Emergency Department and triage nurse.  Should you have questions after your visit or need to cancel or reschedule your appointment, please contact [facility]  Dept: [phone]  and follow the prompts.  Office hours are 8:00 a.m. to 4:30 p.m. Monday - Friday. Please note that voicemails left after 4:00 p.m. may not be returned until the following business day.  We are closed weekends and major holidays. You have access to a nurse at all times for urgent questions. Please call the main number to the clinic Dept: [phone] and follow the prompts.   For any non-urgent questions, you may also contact your provider using MyChart. We now offer e-Visits for anyone 90 and older to request care online for non-urgent symptoms. For details visit [email or web address].   Also download the MyChart app! Go to the app store, search "MyChart", open the app, select [facility], and log in with your MyChart username and password.  Due to Covid, a mask is required upon entering the hospital/clinic. If you do not have a mask, one will be given to you upon arrival. For doctor visits, patients may have 1 support person aged 68 or older with them. For treatment visits, patients cannot have anyone with them due to current Covid guidelines and our immunocompromised population.

## 2021-08-06 NOTE — Progress Notes (Signed)
Denies anyHematology and Oncology Follow Up Visit  Mary Stark PF:[id number] 22-Feb-1956 65 y.o. 08/06/2021 8:17 AM Mary Stark, DOAlexander, [name], DO   [name]: 65 year old woman with adenocarcinoma of the bladder diagnosed in 2021.  She presented with stage IV disease and pelvic adenopathy.   Prior Therapy:  She is status post retroperitoneal lymph node biopsy completed on 10/24/2020 which showed metastatic carcinoma.  She is status post repeat cystoscopy and bilateral ureteral stent exchange as well as resection of a bladder tumor on January 21, 2021.  A final pathology showed an adenocarcinoma.  Chemotherapy utilizing carboplatin and gemcitabine started on November 22, 2020.  She completed 6 cycles of therapy on March 06, 2021.  Current therapy: FOLFOX chemotherapy started on Apr 15, 2021.  He is here for cycle 8 of therapy.   Interim History: Ms. Mary Stark is here for a follow-up visit.  Since her last visit, he reports improvement in her health since her last visit.  She received 2 units of packed red cell transfusion with improvement in her performance status and activity level.  She is no longer reporting shortness of breath, dyspnea exertion or excessive weakness.  She is experiencing more cold sensitivity but no bleeding or difficulty ambulating.  She denies any nausea, vomiting or any GI issues.     Medications: Reviewed without changes. Current Outpatient Medications  Medication Sig Dispense Refill   acetaminophen (TYLENOL) 500 MG tablet Take 1,000 mg by mouth every 8 (eight) hours as needed for moderate pain.      albuterol (PROAIR HFA) 108 (90 Base) MCG/ACT inhaler INHALE 1 TO 2 PUFFS BY MOUTH INTO THE LUNGS EVERY 4 HOURS AS NEEDED FOR WHEEZING OR SHORTNESS OF BREATH 8.5 g 1   amLODipine (NORVASC) 5 MG tablet Take 5 mg by mouth daily.     ARIPiprazole (ABILIFY) 10 MG tablet TAKE 1 TABLET BY MOUTH ONCE DAILY 90 tablet 0   ARIPiprazole 10 MG TABS Take 10  mg by mouth daily. 90 tablet 1   ascorbic acid (VITAMIN C) 500 MG tablet Take 1,000 mg by mouth every evening.      buPROPion (WELLBUTRIN XL) 300 MG 24 hr tablet TAKE 1 TABLET BY MOUTH EVERY DAY 30 tablet 3   cephALEXin (KEFLEX) 250 MG capsule TAKE ONE CAPSULE BY MOUTH NIGHTLY 90 capsule 1   Cholecalciferol (VITAMIN D) 125 MCG (5000 UT) CAPS Take 5,000 Units by mouth every evening.      clonazePAM (KLONOPIN) 0.5 MG tablet TAKE 1 TABLET BY MOUTH AT BEDTIME 30 tablet 0   Coenzyme Q10 (CO Q-10) 200 MG CAPS Take 200 mg by mouth every evening.      gabapentin (NEURONTIN) 300 MG capsule TAKE 1 CAPSULE BY MOUTH IN THE MORNING, 1 CAP IN THE AFTERNOON & 4 CAPS AT NIGHT 180 capsule 3   GLUCOSAMINE-CHONDROITIN PO Take 2 tablets by mouth every evening.      lidocaine-prilocaine (EMLA) cream Apply 1 application topically as needed. 30 g 0   megestrol (MEGACE) 40 MG tablet Take 1 tablet (40 mg total) by mouth 2 (two) times daily. 60 tablet 1   Meth-Hyo-M Bl-Na Phos-Ph Sal (URIBEL) 118 MG CAPS Take one capsule by mouth 3 times daily 30 capsule 3   mirabegron ER (MYRBETRIQ) 25 MG TB24 tablet TAKE 1 TABLET BY MOUTH ONCE A DAY 30 tablet 3   Nebivolol HCl 20 MG TABS Take 0.5 tablets (10 mg total) by mouth daily. 90 tablet 2   Omega-3 Fatty Acids (FISH OIL)  1200 MG CAPS Take 1,200 mg by mouth every evening.      omeprazole (PRILOSEC) 20 MG capsule Take 1 capsule (20 mg total) by mouth daily. 90 capsule 3   ondansetron (ZOFRAN) 4 MG tablet Take 1 tablet (4 mg total) by mouth every 8 (eight) hours as needed for nausea or vomiting. 40 tablet 3   oxybutynin (DITROPAN) 5 MG tablet TAKE 1 TABLET BY MOUTH EVERY 8 HOURS AS NEEDED 90 tablet 3   oxymetazoline (AFRIN) 0.05 % nasal spray Place 1 spray into both nostrils 2 (two) times daily as needed for congestion.     Phenazopyridine HCl (AZO URINARY PAIN PO) Take 1 tablet by mouth 3 (three) times daily as needed (bladder spasms).     simvastatin (ZOCOR) 20 MG tablet TAKE 1  TABLET (20 MG TOTAL) BY MOUTH DAILY. 90 tablet 3   tamsulosin (FLOMAX) 0.4 MG CAPS capsule TAKE 1 CAPSULE BY MOUTH AT BEDTIME 30 capsule 3   thiamine 100 MG tablet Take 1 tablet (100 mg total) by mouth daily. 30 tablet 0   vitamin B-12 (CYANOCOBALAMIN) 1000 MCG tablet Take 2,000 mcg by mouth every evening.      Vitamin E 180 MG (400 UNIT) CAPS Take 400 Units by mouth every evening.      No current facility-administered medications for this visit.     Allergies:  Allergies  Allergen Reactions   Metformin And Related Other (See Comments)    Lactic acidosis    Penicillins Hives and Rash    Reports hives to penicillin and amoxicillin as an adult "a long time ago." Has tolerated cefdinir (09/2020) and cefepime (10/2020) before.      Physical Exam:     Blood pressure (!) 166/90, pulse 87, temperature (!) 97 F (36.1 C), temperature source Tympanic, resp. rate 18, height '5\' 6"'$  (1.676 m), weight 174 lb 9.6 oz (79.2 kg), SpO2 96 %.        ECOG: 1   General appearance: Alert, awake without any distress. Head: Atraumatic without abnormalities Oropharynx: Without any thrush or ulcers. Eyes: No scleral icterus. Lymph nodes: No lymphadenopathy noted in the cervical, supraclavicular, or axillary nodes Heart:regular rate and rhythm, without any murmurs or gallops.   Lung: Clear to auscultation without any rhonchi, wheezes or dullness to percussion. Abdomin: Soft, nontender without any shifting dullness or ascites. Musculoskeletal: No clubbing or cyanosis. Neurological: No motor or sensory deficits. Skin: No rashes or lesions.               Lab Results: Lab Results  Component Value Date   WBC 32.0 (H) 07/28/2021   HGB 8.0 (L) 07/28/2021   HCT 25.5 (L) 07/28/2021   MCV 104.1 (H) 07/28/2021   PLT 95 (L) 07/28/2021     Chemistry      Component Value Date/Time   NA 140 07/28/2021 1233   NA 142 04/26/2018 1333   K 4.6 07/28/2021 1233   CL 105 07/28/2021 1233    CO2 26 07/28/2021 1233   BUN 24 (H) 07/28/2021 1233   BUN 22 04/26/2018 1333   CREATININE 1.07 (H) 07/28/2021 1233   CREATININE 1.32 (H) 06/04/2021 0000   GLU 87 02/07/2013 0000      Component Value Date/Time   CALCIUM 9.2 07/28/2021 1233   ALKPHOS 183 (H) 07/28/2021 1233   AST 27 07/28/2021 1233   ALT 10 07/28/2021 1233   BILITOT 0.6 07/28/2021 1233       Impression and Plan:    65 year old  woman with:   1.    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   She is currently on chemotherapy without any major complications although she has started to experience more immunological toxicity as well as a cold sensitivity.  Risks and benefits of continuing this treatment were reviewed at this time and I recommended continuing this regimen and will reduce the oxaliplatin dosing by 25% given her cytopenia.  The plan is to update her staging scans before the next visit to determine the duration of therapy.  She is agreeable to proceed.   2.  IV access: Port-A-Cath currently in use without any issues.   3.  Antiemetics: Zofran is available to her and has been not successful controlling her nausea.   4.  Renal function surveillance: Her kidney function is close to normal range.  We will continue to monitor on platinum therapy.   5.  Goals of care: Her disease is incurable although aggressive measures are warranted at this time.   6.  Anemia: Related to malignancy and chemotherapy.  Hemoglobin improved today after transfusion.  We will continue to monitor and transfuse as needed.  7.  Neutropenia: Related to chemotherapy and currently on growth factor support.  Neutrophil count is adequate.  8.  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  Dose adjustment will be necessary at this time and reduction by 25% is needed.  We will continue to monitor for bleeding symptoms.   9.  Follow-up: In 2 weeks for the next cycle of therapy.   30   minutes were dedicated to this encounter.  The time spent on reviewing her disease status, addressing complications related to cancer, cancer therapy and future plan of care discussion.   Zola Button, MD 8/24/20228:17 AM

## 2021-08-08 ENCOUNTER — Inpatient Hospital Stay: Payer: No Typology Code available for payment source

## 2021-08-08 ENCOUNTER — Other Ambulatory Visit: Payer: Self-pay

## 2021-08-08 VITALS — BP 117/70 | HR 69 | Temp 98.0°F | Resp 18

## 2021-08-08 DIAGNOSIS — Z5111 Encounter for antineoplastic chemotherapy: Secondary | ICD-10-CM | POA: Diagnosis not present

## 2021-08-08 DIAGNOSIS — C801 Malignant (primary) neoplasm, unspecified: Secondary | ICD-10-CM

## 2021-08-08 DIAGNOSIS — C669 Malignant neoplasm of unspecified ureter: Secondary | ICD-10-CM

## 2021-08-08 MED ORDER — SODIUM CHLORIDE 0.9% FLUSH
10.0000 mL | INTRAVENOUS | Status: DC | PRN
  Administered 2021-08-08: 10 mL

## 2021-08-08 MED ORDER — PEGFILGRASTIM-JMDB 6 MG/0.6ML ~~LOC~~ SOSY
6.0000 mg | PREFILLED_SYRINGE | Freq: Once | SUBCUTANEOUS | Status: AC
  Administered 2021-08-08: 6 mg via SUBCUTANEOUS
  Filled 2021-08-08: qty 0.6

## 2021-08-08 MED ORDER — HEPARIN SOD (PORK) LOCK FLUSH 100 UNIT/ML IV SOLN
500.0000 [IU] | Freq: Once | INTRAVENOUS | Status: AC | PRN
  Administered 2021-08-08: 500 [IU]

## 2021-08-13 ENCOUNTER — Ambulatory Visit (INDEPENDENT_AMBULATORY_CARE_PROVIDER_SITE_OTHER): Payer: No Typology Code available for payment source

## 2021-08-13 ENCOUNTER — Encounter: Payer: Self-pay | Admitting: Osteopathic Medicine

## 2021-08-13 ENCOUNTER — Telehealth (INDEPENDENT_AMBULATORY_CARE_PROVIDER_SITE_OTHER): Payer: No Typology Code available for payment source | Admitting: Osteopathic Medicine

## 2021-08-13 ENCOUNTER — Other Ambulatory Visit: Payer: Self-pay

## 2021-08-13 DIAGNOSIS — R059 Cough, unspecified: Secondary | ICD-10-CM

## 2021-08-13 DIAGNOSIS — Z859 Personal history of malignant neoplasm, unspecified: Secondary | ICD-10-CM

## 2021-08-13 DIAGNOSIS — R519 Headache, unspecified: Secondary | ICD-10-CM | POA: Diagnosis not present

## 2021-08-13 MED ORDER — ADVAIR HFA 115-21 MCG/ACT IN AERO: 2.0000 | INHALATION_SPRAY | Freq: Two times a day (BID) | RESPIRATORY_TRACT | 0 refills | Status: DC

## 2021-08-13 NOTE — Progress Notes (Addendum)
Mary Stark is a 65 y.o. female who presents IN PERSON to [facility] IN-PERSON EVALUATION to  CC/HPI:  Cough ongonig about a month, treated for PNA, improved but then cough doesn't seem to be resolving. NO fever, no SOB, occasional mucus production, reports wheezing at night      ASSESSMENT & PLAN with other pertinent findings:  The encounter diagnosis was Cough.   CXR personally reviewed and radiology over-read as below, sounds more like post-viral cough syndrome. CXR reassuring, I don't see need for ABX at thist time. Advised icnrease albuterol use and I sent ICS/LABA as well for symptom control. Low threshold for PFT. Pt has upcoming CT. No real SOB to concern for PE and wells score 1 w/ cancer history. Would have her consult w/ oncology re: pneumotoxicity of her chemo regimen, I'm not familiar enough witht these meds to comment without further research   There are no Patient Instructions on file for this visit.   Meds ordered this encounter  Medications   fluticasone-salmeterol (ADVAIR HFA) 115-21 MCG/ACT inhaler    Sig: Inhale 2 puffs into the lungs 2 (two) times daily.    Dispense:  1 each    Refill:  0   DG Chest 2 View  Result Date: 08/13/2021 CLINICAL DATA:  History of cancer in a 65 year old female with productive cough and headache for 1 month. EXAM: CHEST - 2 VIEW COMPARISON:  July 20, 2021. FINDINGS: RIGHT-sided Port-A-Cath remains in place, tip at the caval to atrial junction. Cardiomediastinal contours and hilar structures are stable. Lungs are clear aside from mild interstitial prominence with subpleural pattern and RIGHT chest predominance that was seen on previous CT imaging and with decreased conspicuity since July 20, 2021. Subtle changes also noted at the LEFT lung base are slightly improved. There is no evidence of pneumothorax or sign of  pleural effusion. Small nodular density projects over the lateral LEFT eighth rib on the frontal radiograph. On limited assessment there is no acute skeletal process. IMPRESSION: Interstitial changes in the chest with similar distribution when compared to prior imaging and with slight improvement since the study of July 20, 2021. Question confluence of shadows versus small nodule at the LEFT lung base measuring approximately 7 mm. In the context of potential recent infection or inflammation in the chest would suggest short interval follow-up for comparison to ensure resolution. Electronically Signed   By: Zetta Bills M.D.   On: 08/13/2021 13:53     See below for relevant physical exam findings  See below for recent lab and imaging results reviewed  Medications, allergies, PMH, PSH, SocH, [place] reviewed below    Follow-up instructions: Return in about 2 weeks (around 08/27/2021) for recheck CXR, Sooner as needed / to ER if SOB.                                        Exam:  There were no vitals taken for this visit. Constitutional: VS see above. General Appearance: alert, well-developed, well-nourished, NAD Neck: No masses, trachea midline.  Respiratory: Normal respiratory effort. no wheeze, no rhonchi, no rales Cardiovascular: S1/S2 normal, no murmur, no rub/gallop auscultated. RRR.  Musculoskeletal: Gait normal. Symmetric and independent movement of all extremitie Neurological: Normal balance/coordination. No tremor. Skin: warm, dry, intact.  Psychiatric: Normal judgment/insight.  Normal mood and affect. Oriented x3.   Current Meds  Medication Sig   acetaminophen (TYLENOL) 500 MG tablet Take 1,000 mg by mouth every 8 (eight) hours as needed for moderate pain.    albuterol (PROAIR HFA) 108 (90 Base) MCG/ACT inhaler INHALE 1 TO 2 PUFFS BY MOUTH INTO THE LUNGS EVERY 4 HOURS AS NEEDED FOR WHEEZING OR SHORTNESS OF BREATH   amLODipine (NORVASC) 5 MG tablet  Take 5 mg by mouth daily.   ARIPiprazole (ABILIFY) 10 MG tablet TAKE 1 TABLET BY MOUTH ONCE DAILY   ARIPiprazole 10 MG TABS Take 10 mg by mouth daily.   ascorbic acid (VITAMIN C) 500 MG tablet Take 1,000 mg by mouth every evening.    buPROPion (WELLBUTRIN XL) 300 MG 24 hr tablet TAKE 1 TABLET BY MOUTH EVERY DAY   cephALEXin (KEFLEX) 250 MG capsule TAKE ONE CAPSULE BY MOUTH NIGHTLY   Cholecalciferol (VITAMIN D) 125 MCG (5000 UT) CAPS Take 5,000 Units by mouth every evening.    clonazePAM (KLONOPIN) 0.5 MG tablet TAKE 1 TABLET BY MOUTH AT BEDTIME   Coenzyme Q10 (CO Q-10) 200 MG CAPS Take 200 mg by mouth every evening.    fluticasone-salmeterol (ADVAIR HFA) 115-21 MCG/ACT inhaler Inhale 2 puffs into the lungs 2 (two) times daily.   gabapentin (NEURONTIN) 300 MG capsule TAKE 1 CAPSULE BY MOUTH IN THE MORNING, 1 CAP IN THE AFTERNOON & 4 CAPS AT NIGHT   GLUCOSAMINE-CHONDROITIN PO Take 2 tablets by mouth every evening.    lidocaine-prilocaine (EMLA) cream Apply 1 application topically as needed.   megestrol (MEGACE) 40 MG tablet Take 1 tablet (40 mg total) by mouth 2 (two) times daily.   Meth-Hyo-M Bl-Na Phos-Ph Sal (URIBEL) 118 MG CAPS Take one capsule by mouth 3 times daily   mirabegron ER (MYRBETRIQ) 25 MG TB24 tablet TAKE 1 TABLET BY MOUTH ONCE A DAY   Nebivolol HCl 20 MG TABS Take 0.5 tablets (10 mg total) by mouth daily.   Omega-3 Fatty Acids (FISH OIL) 1200 MG CAPS Take 1,200 mg by mouth every evening.    omeprazole (PRILOSEC) 20 MG capsule Take 1 capsule (20 mg total) by mouth daily.   ondansetron (ZOFRAN) 4 MG tablet Take 1 tablet (4 mg total) by mouth every 8 (eight) hours as needed for nausea or vomiting.   oxybutynin (DITROPAN) 5 MG tablet TAKE 1 TABLET BY MOUTH EVERY 8 HOURS AS NEEDED   oxymetazoline (AFRIN) 0.05 % nasal spray Place 1 spray into both nostrils 2 (two) times daily as needed for congestion.   Phenazopyridine HCl (AZO URINARY PAIN PO) Take 1 tablet by mouth 3 (three) times  daily as needed (bladder spasms).   simvastatin (ZOCOR) 20 MG tablet TAKE 1 TABLET (20 MG TOTAL) BY MOUTH DAILY.   tamsulosin (FLOMAX) 0.4 MG CAPS capsule TAKE 1 CAPSULE BY MOUTH AT BEDTIME   thiamine 100 MG tablet Take 1 tablet (100 mg total) by mouth daily.   vitamin B-12 (CYANOCOBALAMIN) 1000 MCG tablet Take 2,000 mcg by mouth every evening.    Vitamin E 180 MG (400 UNIT) CAPS Take 400 Units by mouth every evening.     Allergies  Allergen Reactions   Metformin And Related Other (See Comments)    Lactic acidosis    Penicillins Hives and Rash    Reports hives to penicillin and amoxicillin as an adult "a long time ago." Has tolerated cefdinir (09/2020) and cefepime (10/2020) before.   Metformin     [place] [id number] [place] [id number]  Penicillin G Sodium     NDC [id number] NDC [id number] [place] [id number]    Patient Active Problem List   Diagnosis Date Noted   Encounter for antineoplastic chemotherapy 07/09/2021   UTI (urinary tract infection) 05/23/2021   Right lower quadrant pain 01/01/2021   Incontinence of feces 01/01/2021   Abnormal findings on diagnostic imaging of other abdominal regions, including retroperitoneum 01/01/2021   Change in bowel habit 01/01/2021   Hemorrhoids without complication [id number]   Impingement syndrome, shoulder, left 11/27/2020   Port-A-Cath in place 11/22/2020   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   Malignancy ([place]) 10/31/2020   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   Sepsis secondary to UTI ([place]) 10/22/2020   Normocytic anemia 10/22/2020   Hyperproteinemia 10/22/2020   Hydronephrosis 10/01/2020   Proteinuria 10/01/2020   Abnormal CT of the abdomen 10/01/2020   Bilateral lower extremity edema 09/12/2020   Acute hypoxemic respiratory failure due to COVID-19 ([place]) 08/19/2020   Tear of medial meniscus of left knee, current 07/13/2019   Internal derangement of knee involving posterior horn of lateral meniscus, left  07/13/2019   Primary osteoarthritis of both knees 07/13/2019   Renal insufficiency 07/13/2019   Anxiety about health 03/22/2019   Hypokalemia 03/21/2019   Essential hypertension 02/13/2019   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/2018   Anemia due to blood loss, acute 04/14/2018   Hypomagnesemia 04/14/2018   Hypocalcemia 04/14/2018   Tobacco abuse 04/08/2018   Depression 04/07/2018   AKI (acute kidney injury) ([place]) 04/07/2018   Sepsis ([place]) 04/07/2018   Acute colitis 04/07/2018   Avascular necrosis of humeral head, right ([place]) 03/30/2018   NSAID long-term use 03/30/2018   Goals of care, counseling/discussion 03/30/2018   Seborrheic keratoses 03/15/2018   History of nonmelanoma skin cancer 03/02/2018   Skin lesion of chest wall 03/02/2018   Alcohol dependence with unspecified alcohol-induced disorder ([place]) 02/11/2018   Severe episode of recurrent major depressive disorder, without psychotic features ([place]) 02/11/2018   Transaminitis 01/06/2018   Nicotine dependence 01/06/2018   Heavy alcohol consumption 01/06/2018   Encounter for monitoring statin therapy 01/06/2018   Class 1 obesity due to excess calories with serious comorbidity in adult 01/06/2018   Chronic pain syndrome 04/21/2017   Chronic right shoulder pain 07/03/2015   Vaginismus 04/12/2014   Menopausal state 11/08/2013   Family history of ovarian cancer 09/27/2013   Postmenopausal atrophic vaginitis 09/12/2013   Personal history of colonic polyps 07/18/2013   Dyslipidemia, goal LDL below 100 07/17/2013   Depression with anxiety 07/17/2013   Hx of hepatitis C 07/17/2013   Vitamin D deficiency 07/17/2013   Right lumbar radiculopathy 07/17/2013   History of alcohol dependence ([place]) 07/17/2013   H/O: rheumatic fever 06/30/2012   Insomnia 06/30/2012   Personal history of other infectious and parasitic diseases 06/30/2012    Family History  Problem Relation Age of Onset   Ovarian cancer Mother    Cardiomyopathy Father    Valvular heart  disease Father    Liver cancer Brother    Protein C deficiency Brother    Protein C deficiency Brother    Stroke Brother     Social History   Tobacco Use  Smoking Status Former   Packs/day: 1.00   Years: 25.00   Pack years: 25.00   Types: Cigarettes   Quit date: 2012   Years since quitting: 10.6  Smokeless Tobacco Never    Past Surgical History:  Procedure Laterality Date   ANAL RECTAL MANOMETRY N/A 01/31/2020   Procedure: ANO RECTAL MANOMETRY;  Surgeon: Arta Silence, MD;  Location: [facility];  Service: Endoscopy;  Laterality: N/A;   CESAREAN SECTION  1984   CYSTOSCOPY W/ URETERAL STENT PLACEMENT Bilateral 01/21/2021   Procedure: CYSTOSCOPY WITH STENT REPLACEMENT;  Surgeon: Robley Fries, MD;  Location: [facility];  Service: Urology;  Laterality: Bilateral;  1 HR   CYSTOSCOPY W/ URETERAL STENT PLACEMENT Bilateral 05/20/2021   Procedure: CYSTOSCOPY WITH RETROGRADE PYELOGRAM/URETERAL STENT EXCHANGE;  Surgeon: Robley Fries, MD;  Location: [facility];  Service: Urology;  Laterality: Bilateral;   CYSTOSCOPY WITH RETROGRADE PYELOGRAM, URETEROSCOPY AND STENT PLACEMENT Bilateral 06/11/2020   Procedure: CYSTOSCOPY WITH RETROGRADE PYELOGRAM, URETEROSCOPY AND STENT PLACEMENT, RIGHT URETERAL BIOPSY;  Surgeon: Robley Fries, MD;  Location: [facility];  Service: Urology;  Laterality: Bilateral;  84 MINS   CYSTOSCOPY WITH RETROGRADE PYELOGRAM, URETEROSCOPY AND STENT PLACEMENT Bilateral 07/05/2020   Procedure: CYSTOSCOPY WITH RETROGRADE PYELOGRAM, URETEROSCOPY,  BIOPSIES AND STENT EXCHANGES;  Surgeon: Robley Fries, MD;  Location: [facility];  Service: Urology;  Laterality: Bilateral;   FINGER SURGERY Right    5th   GYNECOLOGIC CRYOSURGERY  YRS AGO   IR IMAGING GUIDED PORT INSERTION  11/18/2020   TONSILLECTOMY  AS CHILD   TRANSURETHRAL RESECTION OF BLADDER TUMOR  01/21/2021   Procedure: TRANSURETHRAL RESECTION OF BLADDER TUMOR (TURBT);   Surgeon: Robley Fries, MD;  Location: [facility];  Service: Urology;;    Immunization History  Administered Date(s) Administered   Influenza,inj,Quad PF,6+ Mos 11/13/2020   Influenza-Unspecified 09/02/2015, 09/13/2018, 09/13/2019   PFIZER Comirnaty(Gray Top)Covid-19 Tri-Sucrose Vaccine 06/27/2021   PFIZER(Purple Top)SARS-COV-2 Vaccination 12/11/2019, 01/01/2020, 12/12/2020   Tdap 10/31/2015    Recent Results (from the past 2160 hour(s))  I-STAT, chem 8     Status: Abnormal   Collection Time: 05/20/21  8:26 AM  Result Value Ref Range   Sodium 141 135 - 145 mmol/L   Potassium 4.1 3.5 - 5.1 mmol/L   Chloride 104 98 - 111 mmol/L   BUN 22 8 - 23 mg/dL   Creatinine, Ser 1.40 (H) 0.44 - 1.00 mg/dL   Glucose, Bld 98 70 - 99 mg/dL    Comment: Glucose reference range applies only to samples taken after fasting for at least 8 hours.   Calcium, Ion 1.26 1.15 - 1.40 mmol/L   TCO2 24 22 - 32 mmol/L   Hemoglobin 9.2 (L) 12.0 - 15.0 g/dL   HCT 27.0 (L) 36.0 - 46.0 %  CBC with Differential/Platelet     Status: Abnormal   Collection Time: 05/22/21 12:00 AM  Result Value Ref Range   WBC 6.3 3.8 - 10.8 Thousand/uL   RBC 2.74 (L) 3.80 - 5.10 Million/uL   Hemoglobin 9.0 (L) 11.7 - 15.5 g/dL   HCT 27.2 (L) 35.0 - 45.0 %   MCV 99.3 80.0 - 100.0 fL   MCH 32.8 27.0 - 33.0 pg   MCHC 33.1 32.0 - 36.0 g/dL   RDW 16.7 (H) 11.0 - 15.0 %   Platelets 149 140 - 400 Thousand/uL   MPV 10.9 7.5 - 12.5 fL   Neutro Abs 3,994 1,500 - 7,800 cells/uL   Lymphs Abs 1,380 850 - 3,900 cells/uL   Absolute Monocytes 907 200 - 950 cells/uL   Eosinophils Absolute 0 (L) 15 - 500 cells/uL   Basophils Absolute 19 0 - 200 cells/uL   Neutrophils Relative % 63.4 %   Total Lymphocyte 21.9 %   Monocytes Relative 14.4 %   Eosinophils Relative  0.0 %   Basophils Relative 0.3 %  COMPLETE METABOLIC PANEL WITH GFR     Status: Abnormal   Collection Time: 05/22/21 12:00 AM  Result Value Ref Range   Glucose,  Bld 108 (H) 65 - 99 mg/dL    Comment: .            Fasting reference interval . For someone without known diabetes, a glucose value between 100 and 125 mg/dL is consistent with prediabetes and should be confirmed with a follow-up test. .    BUN 29 (H) 7 - 25 mg/dL   Creat 1.84 (H) 0.50 - 0.99 mg/dL    Comment: For patients >65 years of age, the reference limit for Creatinine is approximately 13% higher for people identified as African-American. .    GFR, Est Non African American 29 (L) > OR = 60 mL/min/1.45m   GFR, Est African American 33 (L) > OR = 60 mL/min/1.744m  BUN/Creatinine Ratio 16 6 - 22 (calc)   Sodium 134 (L) 135 - 146 mmol/L   Potassium 4.0 3.5 - 5.3 mmol/L   Chloride 101 98 - 110 mmol/L   CO2 23 20 - 32 mmol/L   Calcium 8.1 (L) 8.6 - 10.4 mg/dL   Total Protein 6.5 6.1 - 8.1 g/dL   Albumin 3.7 3.6 - 5.1 g/dL   Globulin 2.8 1.9 - 3.7 g/dL (calc)   AG Ratio 1.3 1.0 - 2.5 (calc)   Total Bilirubin 0.3 0.2 - 1.2 mg/dL   Alkaline phosphatase (APISO) 89 37 - 153 U/L   AST 15 10 - 35 U/L   ALT 9 6 - 29 U/L  Magnesium     Status: Abnormal   Collection Time: 05/22/21 12:00 AM  Result Value Ref Range   Magnesium 1.1 (L) 1.5 - 2.5 mg/dL  Urine Culture     Status: Abnormal   Collection Time: 05/22/21 12:00 AM   Specimen: Urine  Result Value Ref Range   MICRO NUMBER: [id number]  SPECIMEN QUALITY: Adequate    Sample Source URINE    STATUS: FINAL    ISOLATE 1: Escherichia coli (A)     Comment: Greater than 100,000 CFU/mL of Escherichia coli      Susceptibility   Escherichia coli - URINE CULTURE, REFLEX    AMOX/CLAVULANIC 8 Sensitive     AMPICILLIN >=32 Resistant     AMPICILLIN/SULBACTAM 16 Intermediate     CEFAZOLIN* <=4 Not Reportable      * For infections other than uncomplicated UTI caused by E. coli, K. pneumoniae or P. mirabilis: Cefazolin is resistant if MIC > or = 8 mcg/mL. (Distinguishing susceptible versus intermediate for isolates with MIC < or = 4  mcg/mL requires additional testing.) For uncomplicated UTI caused by E. coli, K. pneumoniae or P. mirabilis: Cefazolin is susceptible if MIC <32 mcg/mL and predicts susceptible to the oral agents cefaclor, cefdinir, cefpodoxime, cefprozil, cefuroxime, cephalexin and loracarbef.     CEFEPIME <=1 Sensitive     CEFTRIAXONE <=1 Sensitive     CIPROFLOXACIN >=4 Resistant     LEVOFLOXACIN >=8 Resistant     ERTAPENEM <=0.5 Sensitive     GENTAMICIN <=1 Sensitive     IMIPENEM <=0.25 Sensitive     NITROFURANTOIN <=16 Sensitive     PIP/TAZO <=4 Sensitive     TOBRAMYCIN <=1 Sensitive     TRIMETH/SULFA* <=20 Sensitive      * For infections other than uncomplicated UTI caused by E. coli, K. pneumoniae or P. mirabilis:  Cefazolin is resistant if MIC > or = 8 mcg/mL. (Distinguishing susceptible versus intermediate for isolates with MIC < or = 4 mcg/mL requires additional testing.) For uncomplicated UTI caused by E. coli, K. pneumoniae or P. mirabilis: Cefazolin is susceptible if MIC <32 mcg/mL and predicts susceptible to the oral agents cefaclor, cefdinir, cefpodoxime, cefprozil, cefuroxime, cephalexin and loracarbef. Legend: S = Susceptible  I = Intermediate R = Resistant  NS = Not susceptible * = Not tested  NR = Not reported **NN = See antimicrobic comments   Urinalysis     Status: Abnormal   Collection Time: 05/22/21 12:00 AM  Result Value Ref Range   Color, Urine DARK YELLOW YELLOW   APPearance TURBID (A) CLEAR   Specific Gravity, Urine 1.018 1.001 - 1.035   pH 6.0 5.0 - 8.0   Glucose, UA NEGATIVE NEGATIVE   Bilirubin Urine NEGATIVE NEGATIVE   Ketones, ur NEGATIVE NEGATIVE   Hgb urine dipstick 1+ (A) NEGATIVE   Protein, ur 3+ (A) NEGATIVE   Nitrite POSITIVE (A) NEGATIVE   Leukocytes,Ua 3+ (A) NEGATIVE  Microalbumin / creatinine urine ratio     Status: Abnormal   Collection Time: 05/22/21 12:00 AM  Result Value Ref Range   Creatinine, Urine 127 20 - 275 mg/dL   Microalb, Ur  121.9 mg/dL    Comment: Verified by repeat analysis. Mar[place] Kitchen Reference Range Not established    Microalb Creat Ratio 960 (H) <30 mcg/mg creat    Comment: . The ADA defines abnormalities in albumin excretion as follows: Mar[place] Kitchen Albuminuria Category        Result (mcg/mg creatinine) . Normal to Mildly increased   <30 Moderately increased         30-299  Severely increased           > OR = 300 . The ADA recommends that at least two of three specimens collected within a 3-6 month period be abnormal before considering a patient to be within a diagnostic category.   Comprehensive metabolic panel     Status: Abnormal   Collection Time: 05/23/21  1:00 AM  Result Value Ref Range   Sodium 130 (L) 135 - 145 mmol/L   Potassium 3.3 (L) 3.5 - 5.1 mmol/L   Chloride 100 98 - 111 mmol/L   CO2 19 (L) 22 - 32 mmol/L   Glucose, Bld 103 (H) 70 - 99 mg/dL    Comment: Glucose reference range applies only to samples taken after fasting for at least 8 hours.   BUN 27 (H) 8 - 23 mg/dL   Creatinine, Ser 2.07 (H) 0.44 - 1.00 mg/dL   Calcium 7.9 (L) 8.9 - 10.3 mg/dL   Total Protein 6.9 6.5 - 8.1 g/dL   Albumin 3.4 (L) 3.5 - 5.0 g/dL   AST 24 15 - 41 U/L   ALT 17 0 - 44 U/L   Alkaline Phosphatase 74 38 - 126 U/L   Total Bilirubin 0.7 0.3 - 1.2 mg/dL   GFR, Estimated 26 (L) >60 mL/min    Comment: (NOTE) Calculated using the CKD-EPI Creatinine Equation (2021)    Anion gap 11 5 - 15    Comment: Performed at [facility], [place] [address], [place], [place] [address]  Lactic acid, plasma     Status: None   Collection Time: 05/23/21  1:00 AM  Result Value Ref Range   Lactic Acid, Venous 1.9 0.5 - 1.9 mmol/L    Comment: Performed at [facility], [place]  [address] [place], [place] [address]  CBC with Differential     Status: Abnormal   Collection Time: 05/23/21  1:00 AM  Result Value Ref Range   WBC 5.8 4.0 - 10.5 K/uL   RBC 2.85 (L) 3.87 - 5.11 MIL/uL   Hemoglobin 9.4 (L)  12.0 - 15.0 g/dL   HCT 29.1 (L) 36.0 - 46.0 %   MCV 102.1 (H) 80.0 - 100.0 fL   MCH 33.0 26.0 - 34.0 pg   MCHC 32.3 30.0 - 36.0 g/dL   RDW 17.2 (H) 11.5 - 15.5 %   Platelets 125 (L) 150 - 400 K/uL   nRBC 1.0 (H) 0.0 - 0.2 %   Neutrophils Relative % 58 %   Neutro Abs 3.3 1.7 - 7.7 K/uL   Lymphocytes Relative 19 %   Lymphs Abs 1.1 0.7 - 4.0 K/uL   Monocytes Relative 21 %   Monocytes Absolute 1.2 (H) 0.1 - 1.0 K/uL   Eosinophils Relative 0 %   Eosinophils Absolute 0.0 0.0 - 0.5 K/uL   Basophils Relative 0 %   Basophils Absolute 0.0 0.0 - 0.1 K/uL   Immature Granulocytes 2 %   Abs Immature Granulocytes 0.09 (H) 0.00 - 0.07 K/uL    Comment: Performed at [facility], [place] [address], [place], [place] [address]  Protime-INR     Status: None   Collection Time: 05/23/21  1:00 AM  Result Value Ref Range   Prothrombin Time 14.5 11.4 - 15.2 seconds   INR 1.1 0.8 - 1.2    Comment: (NOTE) INR goal varies based on device and disease states. Performed at [facility], [place] [address] [place], [place] [address]   Culture, blood (Routine x 2)     Status: Abnormal   Collection Time: 05/23/21  1:00 AM   Specimen: BLOOD  Result Value Ref Range   Specimen Description      BLOOD LEFT ANTECUBITAL Performed at [place] [address], [place], [place] [address]    Special Requests      BOTTLES DRAWN AEROBIC AND ANAEROBIC Blood Culture adequate volume Performed at [place] [address] [place], [place] [address]    Culture  Setup Time      GRAM NEGATIVE RODS IN BOTH AEROBIC AND ANAEROBIC BOTTLES CRITICAL VALUE NOTED.  VALUE IS CONSISTENT WITH PREVIOUSLY REPORTED AND CALLED VALUE.    Culture (A)     ESCHERICHIA COLI SUSCEPTIBILITIES PERFORMED ON PREVIOUS CULTURE WITHIN THE LAST 5 DAYS. Performed at [facility], [place] [address], [place], [place] [address]    Report Status 05/26/2021 FINAL   Resp  Panel by RT-PCR (Flu A&B, Covid) Nasopharyngeal Swab     Status: None   Collection Time: 05/23/21  1:00 AM   Specimen: Nasopharyngeal Swab; Nasopharyngeal(NP) swabs in vial transport medium  Result Value Ref Range   SARS Coronavirus 2 by RT PCR NEGATIVE NEGATIVE    Comment: (NOTE) SARS-CoV-2 target nucleic acids are NOT DETECTED.  The SARS-CoV-2 RNA is generally detectable in upper respiratory specimens during the acute phase of infection. The lowest concentration of SARS-CoV-2 viral copies this assay can detect is 138 copies/mL. A negative result does not preclude SARS-Cov-2 infection and should not be used as the sole basis for treatment or other patient management decisions. A negative result may occur with  improper specimen collection/handling, submission of specimen other than nasopharyngeal swab, presence of viral mutation(s) within the areas targeted by this assay,  and inadequate number of viral copies(<138 copies/mL). A negative result must be combined with clinical observations, patient history, and epidemiological information. The expected result is Negative.  Fact Sheet for Patients:  EntrepreneurPulse.com.au  Fact Sheet for Healthcare Providers:  IncredibleEmployment.be  This test is no t yet approved or cleared by the [place] FDA and  has been authorized for detection and/or diagnosis of SARS-CoV-2 by FDA under an Emergency Use Authorization (EUA). This EUA will remain  in effect (meaning this test can be used) for the duration of the COVID-19 declaration under Section 564(b)(1) of the Act, 21 U.S.C.section 360bbb-3(b)(1), unless the authorization is terminated  or revoked sooner.       Influenza A by PCR NEGATIVE NEGATIVE   Influenza B by PCR NEGATIVE NEGATIVE    Comment: (NOTE) The Xpert Xpress SARS-CoV-2/FLU/RSV plus assay is intended as an aid in the diagnosis of influenza from Nasopharyngeal swab specimens  and should not be used as a sole basis for treatment. Nasal washings and aspirates are unacceptable for Xpert Xpress SARS-CoV-2/FLU/RSV testing.  Fact Sheet for Patients: EntrepreneurPulse.com.au  Fact Sheet for Healthcare Providers: IncredibleEmployment.be  This test is not yet approved or cleared by the [place] FDA and has been authorized for detection and/or diagnosis of SARS-CoV-2 by FDA under an Emergency Use Authorization (EUA). This EUA will remain in effect (meaning this test can be used) for the duration of the COVID-19 declaration under Section 564(b)(1) of the Act, 21 U.S.C. section 360bbb-3(b)(1), unless the authorization is terminated or revoked.  Performed at [facility], [place] [address], [place], [place] [address]   APTT     Status: None   Collection Time: 05/23/21  1:00 AM  Result Value Ref Range   aPTT 34 24 - 36 seconds    Comment: Performed at [facility], [place] [address] [place], [place] [address]  Lipase, blood     Status: None   Collection Time: 05/23/21  1:00 AM  Result Value Ref Range   Lipase 19 11 - 51 U/L    Comment: Performed at [facility], [place] [address] [place], [place] [address]  Culture, blood (Routine x 2)     Status: Abnormal   Collection Time: 05/23/21  1:04 AM   Specimen: BLOOD LEFT HAND  Result Value Ref Range   Specimen Description      BLOOD LEFT HAND Performed at [place] [address] [place], [place] [address]    Special Requests      BOTTLES DRAWN AEROBIC AND ANAEROBIC Blood Culture adequate volume Performed at [place] [address], [place], [place] [address]    Culture  Setup Time      GRAM NEGATIVE RODS IN BOTH AEROBIC AND ANAEROBIC BOTTLES CRITICAL RESULT CALLED TO, READ BACK BY AND VERIFIED WITH: TERRI GREEN PHARMD '@1918'$  05/23/21 EB Performed at [facility],  [place] [address] [place], [place] [address]    Culture ESCHERICHIA COLI (A)    Report Status 05/25/2021 FINAL    Organism ID, Bacteria ESCHERICHIA COLI       Susceptibility   Escherichia coli - MIC*    AMPICILLIN >=32 RESISTANT Resistant     CEFAZOLIN <=4 SENSITIVE Sensitive     CEFEPIME <=0.12 SENSITIVE Sensitive     CEFTAZIDIME <=1 SENSITIVE Sensitive     CEFTRIAXONE <=0.25 SENSITIVE Sensitive     CIPROFLOXACIN >=4 RESISTANT Resistant     GENTAMICIN <=1 SENSITIVE Sensitive  IMIPENEM <=0.25 SENSITIVE Sensitive     TRIMETH/SULFA <=20 SENSITIVE Sensitive     AMPICILLIN/SULBACTAM 16 INTERMEDIATE Intermediate     PIP/TAZO <=4 SENSITIVE Sensitive     * ESCHERICHIA COLI  Blood Culture ID Panel (Reflexed)     Status: Abnormal   Collection Time: 05/23/21  1:04 AM  Result Value Ref Range   Enterococcus faecalis NOT DETECTED NOT DETECTED   Enterococcus Faecium NOT DETECTED NOT DETECTED   Listeria monocytogenes NOT DETECTED NOT DETECTED   Staphylococcus species NOT DETECTED NOT DETECTED   Staphylococcus aureus (BCID) NOT DETECTED NOT DETECTED   Staphylococcus epidermidis NOT DETECTED NOT DETECTED   Staphylococcus lugdunensis NOT DETECTED NOT DETECTED   Streptococcus species NOT DETECTED NOT DETECTED   Streptococcus agalactiae NOT DETECTED NOT DETECTED   Streptococcus pneumoniae NOT DETECTED NOT DETECTED   Streptococcus pyogenes NOT DETECTED NOT DETECTED   A.calcoaceticus-baumannii NOT DETECTED NOT DETECTED   Bacteroides fragilis NOT DETECTED NOT DETECTED   Enterobacterales DETECTED (A) NOT DETECTED    Comment: Enterobacterales represent a large order of gram negative bacteria, not a single organism. CRITICAL RESULT CALLED TO, READ BACK BY AND VERIFIED WITH: TERRI GREEN PHARMD '@1917'$  05/23/21 EB    Enterobacter cloacae complex NOT DETECTED NOT DETECTED   Escherichia coli DETECTED (A) NOT DETECTED    Comment: CRITICAL RESULT CALLED TO, READ BACK BY AND VERIFIED WITH: TERRI GREEN PHARMD  '@1918'$  05/23/21 EB    Klebsiella aerogenes NOT DETECTED NOT DETECTED   Klebsiella oxytoca NOT DETECTED NOT DETECTED   Klebsiella pneumoniae NOT DETECTED NOT DETECTED   Proteus species NOT DETECTED NOT DETECTED   Salmonella species NOT DETECTED NOT DETECTED   Serratia marcescens NOT DETECTED NOT DETECTED   Haemophilus influenzae NOT DETECTED NOT DETECTED   Neisseria meningitidis NOT DETECTED NOT DETECTED   Pseudomonas aeruginosa NOT DETECTED NOT DETECTED   Stenotrophomonas maltophilia NOT DETECTED NOT DETECTED   Candida albicans NOT DETECTED NOT DETECTED   Candida auris NOT DETECTED NOT DETECTED   Candida glabrata NOT DETECTED NOT DETECTED   Candida krusei NOT DETECTED NOT DETECTED   Candida parapsilosis NOT DETECTED NOT DETECTED   Candida tropicalis NOT DETECTED NOT DETECTED   Cryptococcus neoformans/gattii NOT DETECTED NOT DETECTED   CTX-M ESBL NOT DETECTED NOT DETECTED   Carbapenem resistance IMP NOT DETECTED NOT DETECTED   Carbapenem resistance KPC NOT DETECTED NOT DETECTED   Carbapenem resistance NDM NOT DETECTED NOT DETECTED   Carbapenem resist OXA 48 LIKE NOT DETECTED NOT DETECTED   Carbapenem resistance VIM NOT DETECTED NOT DETECTED    Comment: Performed at [facility], [address], [place], [place] [address]  Urinalysis, Routine w reflex microscopic Urine, Clean Catch     Status: Abnormal   Collection Time: 05/23/21  2:37 AM  Result Value Ref Range   Color, Urine AMBER (A) YELLOW    Comment: BIOCHEMICALS MAY BE AFFECTED BY COLOR   APPearance HAZY (A) CLEAR   Specific Gravity, Urine 1.009 1.005 - 1.030   pH 6.0 5.0 - 8.0   Glucose, UA NEGATIVE NEGATIVE mg/dL   Hgb urine dipstick SMALL (A) NEGATIVE   Bilirubin Urine NEGATIVE NEGATIVE   Ketones, ur NEGATIVE NEGATIVE mg/dL   Protein, ur 100 (A) NEGATIVE mg/dL   Nitrite POSITIVE (A) NEGATIVE   Leukocytes,Ua LARGE (A) NEGATIVE   RBC / HPF 11-20 0 - 5 RBC/hpf   WBC, UA >50 (H) 0 - 5 WBC/hpf   Bacteria, UA FEW (A)  NONE SEEN   Mucus PRESENT  Hyaline Casts, UA PRESENT     Comment: Performed at [facility], [place] [address] [place], [place] [address]  Urine culture     Status: Abnormal   Collection Time: 05/23/21  2:37 AM   Specimen: In/Out Cath Urine  Result Value Ref Range   Specimen Description      IN/OUT CATH URINE Performed at [facility], [place] [address] [place], [place] [address]    Special Requests      NONE Performed at [facility], [place] [address], [place], [place] [address]    Culture 20,000 COLONIES/mL ESCHERICHIA COLI (A)    Report Status 05/25/2021 FINAL    Organism ID, Bacteria ESCHERICHIA COLI (A)       Susceptibility   Escherichia coli - MIC*    AMPICILLIN >=32 RESISTANT Resistant     CEFAZOLIN <=4 SENSITIVE Sensitive     CEFEPIME <=0.12 SENSITIVE Sensitive     CEFTRIAXONE <=0.25 SENSITIVE Sensitive     CIPROFLOXACIN >=4 RESISTANT Resistant     GENTAMICIN <=1 SENSITIVE Sensitive     IMIPENEM <=0.25 SENSITIVE Sensitive     NITROFURANTOIN <=16 SENSITIVE Sensitive     TRIMETH/SULFA <=20 SENSITIVE Sensitive     AMPICILLIN/SULBACTAM >=32 RESISTANT Resistant     PIP/TAZO <=4 SENSITIVE Sensitive     * 20,000 COLONIES/mL ESCHERICHIA COLI  Magnesium     Status: Abnormal   Collection Time: 05/23/21 10:45 AM  Result Value Ref Range   Magnesium 0.5 (LL) 1.7 - 2.4 mg/dL    Comment: CRITICAL RESULT CALLED TO, READ BACK BY AND VERIFIED WITH: WOODY,A. RN AT 1108 05/23/21 MULLINS,T Performed at [facility], [place] [address], [place], [place] [address]   Renal function panel     Status: Abnormal   Collection Time: 05/23/21  5:15 PM  Result Value Ref Range   Sodium 133 (L) 135 - 145 mmol/L   Potassium 3.4 (L) 3.5 - 5.1 mmol/L   Chloride 106 98 - 111 mmol/L   CO2 15 (L) 22 - 32 mmol/L   Glucose, Bld 112 (H) 70 - 99 mg/dL    Comment: Glucose reference range applies only to samples taken after fasting for at  least 8 hours.   BUN 22 8 - 23 mg/dL   Creatinine, Ser 1.74 (H) 0.44 - 1.00 mg/dL   Calcium 7.8 (L) 8.9 - 10.3 mg/dL   Phosphorus 2.7 2.5 - 4.6 mg/dL   Albumin 3.2 (L) 3.5 - 5.0 g/dL   GFR, Estimated 33 (L) >60 mL/min    Comment: (NOTE) Calculated using the CKD-EPI Creatinine Equation (2021)    Anion gap 12 5 - 15    Comment: Performed at [facility], [place] [address] [place], [place] [address]  Magnesium     Status: Abnormal   Collection Time: 05/23/21  5:15 PM  Result Value Ref Range   Magnesium 1.5 (L) 1.7 - 2.4 mg/dL    Comment: Performed at [facility], [place] [address] [place], [place] [address]  Protime-INR     Status: Abnormal   Collection Time: 05/24/21  5:51 AM  Result Value Ref Range   Prothrombin Time 16.6 (H) 11.4 - 15.2 seconds   INR 1.3 (H) 0.8 - 1.2    Comment: (NOTE) INR goal varies based on device and disease states. Performed at [facility], [place] [address] [place], [place] [address]   Cortisol-am, blood     Status: None   Collection  Time: 05/24/21  5:51 AM  Result Value Ref Range   Cortisol - AM 21.2 6.7 - 22.6 ug/dL    Comment: Performed at [facility], [place] [address] [place], [place] [address]  Procalcitonin     Status: None   Collection Time: 05/24/21  5:51 AM  Result Value Ref Range   Procalcitonin 12.12 ng/mL    Comment:        Interpretation: PCT >= 10 ng/mL: Important systemic inflammatory response, almost exclusively due to severe bacterial sepsis or septic shock. (NOTE)       Sepsis PCT Algorithm           Lower Respiratory Tract                                      Infection PCT Algorithm    ----------------------------     ----------------------------         PCT < 0.25 ng/mL                PCT < 0.10 ng/mL          Strongly encourage             Strongly discourage   discontinuation of antibiotics    initiation of antibiotics    ----------------------------      -----------------------------       PCT 0.25 - 0.50 ng/mL            PCT 0.10 - 0.25 ng/mL               OR       >80% decrease in PCT            Discourage initiation of                                            antibiotics      Encourage discontinuation           of antibiotics    ----------------------------     -----------------------------         PCT >= 0.50 ng/mL              PCT 0.26 - 0.50 ng/mL                AND       <80% decrease in PCT             Encourage initiation of                                             antibiotics       Encourage continuation           of antibiotics    ----------------------------     -----------------------------        PCT >= 0.50 ng/mL                  PCT > 0.50 ng/mL               AND         increase in PCT  Strongly encourage                                      initiation of antibiotics    Strongly encourage escalation           of antibiotics                                     -----------------------------                                           PCT <= 0.25 ng/mL                                                 OR                                        > 80% decrease in PCT                                      Discontinue / Do not initiate                                             antibiotics  Performed at [place] [address], [place], [place] [address]   Comprehensive metabolic panel     Status: Abnormal   Collection Time: 05/24/21  5:51 AM  Result Value Ref Range   Sodium 135 135 - 145 mmol/L   Potassium 3.4 (L) 3.5 - 5.1 mmol/L   Chloride 107 98 - 111 mmol/L   CO2 20 (L) 22 - 32 mmol/L   Glucose, Bld 108 (H) 70 - 99 mg/dL    Comment: Glucose reference range applies only to samples taken after fasting for at least 8 hours.   BUN 15 8 - 23 mg/dL   Creatinine, Ser 1.61 (H) 0.44 - 1.00 mg/dL   Calcium 8.1 (L) 8.9 - 10.3 mg/dL   Total Protein 6.8 6.5 - 8.1 g/dL   Albumin  3.1 (L) 3.5 - 5.0 g/dL   AST 44 (H) 15 - 41 U/L   ALT 22 0 - 44 U/L   Alkaline Phosphatase 72 38 - 126 U/L   Total Bilirubin 0.4 0.3 - 1.2 mg/dL   GFR, Estimated 36 (L) >60 mL/min    Comment: (NOTE) Calculated using the CKD-EPI Creatinine Equation (2021)    Anion gap 8 5 - 15    Comment: Performed at [facility], [place] [address] [place], [place] [address]  CBC     Status: Abnormal   Collection Time: 05/24/21  5:51 AM  Result Value Ref Range   WBC 2.9 (L) 4.0 - 10.5 K/uL   RBC 2.61 (L) 3.87 - 5.11 MIL/uL   Hemoglobin 8.7 (L) 12.0 - 15.0 g/dL  HCT 26.9 (L) 36.0 - 46.0 %   MCV 103.1 (H) 80.0 - 100.0 fL   MCH 33.3 26.0 - 34.0 pg   MCHC 32.3 30.0 - 36.0 g/dL   RDW 17.2 (H) 11.5 - 15.5 %   Platelets 102 (L) 150 - 400 K/uL    Comment: Immature Platelet Fraction may be clinically indicated, consider ordering this additional test [id number]    nRBC 1.7 (H) 0.0 - 0.2 %    Comment: Performed at [facility], [place] [address] [place], [place] [address]  Magnesium     Status: Abnormal   Collection Time: 05/24/21  5:51 AM  Result Value Ref Range   Magnesium 1.3 (L) 1.7 - 2.4 mg/dL    Comment: Performed at [facility], [place] [address], [place], [place] [address]  C Difficile Quick Screen w PCR reflex     Status: None   Collection Time: 05/24/21 11:16 AM   Specimen: STOOL  Result Value Ref Range   C Diff antigen NEGATIVE NEGATIVE   C Diff toxin NEGATIVE NEGATIVE   C Diff interpretation No C. difficile detected.     Comment: NEGATIVE Performed at [facility], [place] [address], [place], [place] [address]   CBC with Differential/Platelet     Status: Abnormal   Collection Time: 05/25/21  5:06 AM  Result Value Ref Range   WBC 3.7 (L) 4.0 - 10.5 K/uL   RBC 2.59 (L) 3.87 - 5.11 MIL/uL   Hemoglobin 8.5 (L) 12.0 - 15.0 g/dL   HCT 26.3 (L) 36.0 - 46.0 %   MCV 101.5 (H) 80.0 - 100.0 fL   MCH 32.8 26.0 - 34.0 pg    MCHC 32.3 30.0 - 36.0 g/dL   RDW 17.2 (H) 11.5 - 15.5 %   Platelets 98 (L) 150 - 400 K/uL    Comment: Immature Platelet Fraction may be clinically indicated, consider ordering this additional test [id number] CONSISTENT WITH PREVIOUS RESULT    nRBC 1.1 (H) 0.0 - 0.2 %   Neutrophils Relative % 36 %   Neutro Abs 1.3 (L) 1.7 - 7.7 K/uL   Lymphocytes Relative 25 %   Lymphs Abs 0.9 0.7 - 4.0 K/uL   Monocytes Relative 36 %   Monocytes Absolute 1.3 (H) 0.1 - 1.0 K/uL   Eosinophils Relative 0 %   Eosinophils Absolute 0.0 0.0 - 0.5 K/uL   Basophils Relative 0 %   Basophils Absolute 0.0 0.0 - 0.1 K/uL   WBC Morphology DOHLE BODIES     Comment: ATYPICAL MONONUCLEAR CELLS MILD LEFT SHIFT (1-5% METAS, OCC MYELO, OCC BANDS) TOXIC GRANULATION    Immature Granulocytes 3 %   Abs Immature Granulocytes 0.12 (H) 0.00 - 0.07 K/uL    Comment: Performed at [facility], [place] [address], [place], [place] [address]  Comprehensive metabolic panel     Status: Abnormal   Collection Time: 05/25/21  5:06 AM  Result Value Ref Range   Sodium 133 (L) 135 - 145 mmol/L   Potassium 3.2 (L) 3.5 - 5.1 mmol/L   Chloride 104 98 - 111 mmol/L   CO2 17 (L) 22 - 32 mmol/L   Glucose, Bld 107 (H) 70 - 99 mg/dL    Comment: Glucose reference range applies only to samples taken after fasting for at least 8 hours.   BUN 16 8 - 23 mg/dL   Creatinine, Ser 1.77 (H) 0.44 - 1.00 mg/dL   Calcium 8.2 (L) 8.9 - 10.3 mg/dL  Total Protein 7.2 6.5 - 8.1 g/dL   Albumin 3.2 (L) 3.5 - 5.0 g/dL   AST 37 15 - 41 U/L   ALT 23 0 - 44 U/L   Alkaline Phosphatase 72 38 - 126 U/L   Total Bilirubin 0.5 0.3 - 1.2 mg/dL   GFR, Estimated 32 (L) >60 mL/min    Comment: (NOTE) Calculated using the CKD-EPI Creatinine Equation (2021)    Anion gap 12 5 - 15    Comment: Performed at [facility], [place] [address], [place], [place] [address]  Magnesium     Status: None   Collection Time: 05/25/21  5:06 AM  Result  Value Ref Range   Magnesium 2.0 1.7 - 2.4 mg/dL    Comment: Performed at [facility], [place] [address] [place], [place] [address]  Gastrointestinal Panel by PCR , Stool     Status: None   Collection Time: 05/25/21  2:35 PM   Specimen: Stool  Result Value Ref Range   Campylobacter species NOT DETECTED NOT DETECTED   Plesimonas shigelloides NOT DETECTED NOT DETECTED   Salmonella species NOT DETECTED NOT DETECTED   Yersinia enterocolitica NOT DETECTED NOT DETECTED   Vibrio species NOT DETECTED NOT DETECTED   Vibrio cholerae NOT DETECTED NOT DETECTED   Enteroaggregative E coli (EAEC) NOT DETECTED NOT DETECTED   Enteropathogenic E coli (EPEC) NOT DETECTED NOT DETECTED   Enterotoxigenic E coli (ETEC) NOT DETECTED NOT DETECTED   Shiga like toxin producing E coli (STEC) NOT DETECTED NOT DETECTED   Shigella/Enteroinvasive E coli (EIEC) NOT DETECTED NOT DETECTED   Cryptosporidium NOT DETECTED NOT DETECTED   Cyclospora cayetanensis NOT DETECTED NOT DETECTED   Entamoeba histolytica NOT DETECTED NOT DETECTED   Giardia lamblia NOT DETECTED NOT DETECTED   Adenovirus F40/41 NOT DETECTED NOT DETECTED   Astrovirus NOT DETECTED NOT DETECTED   Norovirus GI/GII NOT DETECTED NOT DETECTED   Rotavirus A NOT DETECTED NOT DETECTED   Sapovirus (I, II, IV, and V) NOT DETECTED NOT DETECTED    Comment: Performed at [facility], [place]., [place], [place] [address]  CBC with Differential/Platelet     Status: Abnormal   Collection Time: 05/25/21  9:00 PM  Result Value Ref Range   WBC 5.2 4.0 - 10.5 K/uL   RBC 2.38 (L) 3.87 - 5.11 MIL/uL   Hemoglobin 7.7 (L) 12.0 - 15.0 g/dL   HCT 23.9 (L) 36.0 - 46.0 %   MCV 100.4 (H) 80.0 - 100.0 fL   MCH 32.4 26.0 - 34.0 pg   MCHC 32.2 30.0 - 36.0 g/dL   RDW 17.2 (H) 11.5 - 15.5 %   Platelets 114 (L) 150 - 400 K/uL    Comment: Immature Platelet Fraction may be clinically indicated, consider ordering this additional  test [id number] CONSISTENT WITH PREVIOUS RESULT    nRBC 1.2 (H) 0.0 - 0.2 %   Neutrophils Relative % 25 %   Neutro Abs 1.3 (L) 1.7 - 7.7 K/uL   Lymphocytes Relative 26 %   Lymphs Abs 1.3 0.7 - 4.0 K/uL   Monocytes Relative 43 %   Monocytes Absolute 2.2 (H) 0.1 - 1.0 K/uL   Eosinophils Relative 0 %   Eosinophils Absolute 0.0 0.0 - 0.5 K/uL   Basophils Relative 0 %   Basophils Absolute 0.0 0.0 - 0.1 K/uL   WBC Morphology      MODERATE LEFT SHIFT (>5% METAS AND MYELOS,OCC PRO NOTED)    Comment: TOXIC GRANULATION  Immature Granulocytes 6 %   Abs Immature Granulocytes 0.33 (H) 0.00 - 0.07 K/uL    Comment: Performed at [facility], [place] [address] [place], [place] [id number]  Basic metabolic panel     Status: Abnormal   Collection Time: 05/26/21  4:00 AM  Result Value Ref Range   Sodium 134 (L) 135 - 145 mmol/L   Potassium 3.0 (L) 3.5 - 5.1 mmol/L   Chloride 108 98 - 111 mmol/L   CO2 20 (L) 22 - 32 mmol/L   Glucose, Bld 89 70 - 99 mg/dL    Comment: Glucose reference range applies only to samples taken after fasting for at least 8 hours.   BUN 17 8 - 23 mg/dL   Creatinine, Ser 1.43 (H) 0.44 - 1.00 mg/dL   Calcium 8.0 (L) 8.9 - 10.3 mg/dL   GFR, Estimated 41 (L) >60 mL/min    Comment: (NOTE) Calculated using the CKD-EPI Creatinine Equation (2021)    Anion gap 6 5 - 15    Comment: Performed at [facility], [place] [address], [place], [place] [address]  Magnesium     Status: Abnormal   Collection Time: 05/26/21  4:00 AM  Result Value Ref Range   Magnesium 1.6 (L) 1.7 - 2.4 mg/dL    Comment: Performed at [facility], [place] [address], [place], [place] [address]  CBC with Differential/Platelet     Status: Abnormal   Collection Time: 05/27/21  5:22 AM  Result Value Ref Range   WBC 6.8 4.0 - 10.5 K/uL   RBC 2.40 (L) 3.87 - 5.11 MIL/uL   Hemoglobin 7.9 (L) 12.0 - 15.0 g/dL   HCT 24.0 (L) 36.0 - 46.0 %   MCV 100.0 80.0 - 100.0 fL    MCH 32.9 26.0 - 34.0 pg   MCHC 32.9 30.0 - 36.0 g/dL   RDW 17.2 (H) 11.5 - 15.5 %   Platelets 144 (L) 150 - 400 K/uL   nRBC 0.7 (H) 0.0 - 0.2 %   Neutrophils Relative % 34 %   Neutro Abs 2.3 1.7 - 7.7 K/uL   Lymphocytes Relative 23 %   Lymphs Abs 1.6 0.7 - 4.0 K/uL   Monocytes Relative 30 %   Monocytes Absolute 2.0 (H) 0.1 - 1.0 K/uL   Eosinophils Relative 0 %   Eosinophils Absolute 0.0 0.0 - 0.5 K/uL   Basophils Relative 1 %   Basophils Absolute 0.1 0.0 - 0.1 K/uL   Immature Granulocytes 12 %   Abs Immature Granulocytes 0.81 (H) 0.00 - 0.07 K/uL    Comment: Performed at [facility], [place] [address], [place],  [id number]  Basic metabolic panel     Status: Abnormal   Collection Time: 05/27/21  5:22 AM  Result Value Ref Range   Sodium 134 (L) 135 - 145 mmol/L   Potassium 3.6 3.5 - 5.1 mmol/L   Chloride 107 98 - 111 mmol/L   CO2 19 (L) 22 - 32 mmol/L   Glucose, Bld 100 (H) 70 - 99 mg/dL    Comment: Glucose reference range applies only to samples taken after fasting for at least 8 hours.   BUN 16 8 - 23 mg/dL   Creatinine, Ser 1.51 (H) 0.44 - 1.00 mg/dL   Calcium 8.4 (L) 8.9 - 10.3 mg/dL   GFR, Estimated 39 (L) >60 mL/min    Comment: (NOTE) Calculated using the CKD-EPI Creatinine Equation (2021)    Anion gap 8 5 -  15    Comment: Performed at [facility], [place] [address], [place], [place] [address]  Sample to Blood Bank     Status: None   Collection Time: 05/28/21 11:32 AM  Result Value Ref Range   Blood Bank Specimen SAMPLE AVAILABLE FOR TESTING    Sample Expiration      05/31/2021,2359 Performed at [facility], [place] [address], [place], [place] [address]   CMP ([place] only)     Status: Abnormal   Collection Time: 05/28/21 11:32 AM  Result Value Ref Range   Sodium 134 (L) 135 - 145 mmol/L   Potassium 3.7 3.5 - 5.1 mmol/L   Chloride 107 98 - 111 mmol/L   CO2 16 (L) 22 - 32 mmol/L   Glucose, Bld 106  (H) 70 - 99 mg/dL    Comment: Glucose reference range applies only to samples taken after fasting for at least 8 hours.   BUN 19 8 - 23 mg/dL   Creatinine 1.33 (H) 0.44 - 1.00 mg/dL   Calcium 8.5 (L) 8.9 - 10.3 mg/dL   Total Protein 7.2 6.5 - 8.1 g/dL   Albumin 2.8 (L) 3.5 - 5.0 g/dL   AST 18 15 - 41 U/L   ALT 15 0 - 44 U/L   Alkaline Phosphatase 78 38 - 126 U/L   Total Bilirubin 0.3 0.3 - 1.2 mg/dL   GFR, Estimated 45 (L) >60 mL/min    Comment: (NOTE) Calculated using the CKD-EPI Creatinine Equation (2021)    Anion gap 11 5 - 15    Comment: Performed at [facility], [place] [address], [place], [place] [address]  CBC with Differential ([place] Only)     Status: Abnormal   Collection Time: 05/28/21 11:32 AM  Result Value Ref Range   WBC Count 12.1 (H) 4.0 - 10.5 K/uL   RBC 2.45 (L) 3.87 - 5.11 MIL/uL   Hemoglobin 8.0 (L) 12.0 - 15.0 g/dL   HCT 24.4 (L) 36.0 - 46.0 %   MCV 99.6 80.0 - 100.0 fL   MCH 32.7 26.0 - 34.0 pg   MCHC 32.8 30.0 - 36.0 g/dL   RDW 17.4 (H) 11.5 - 15.5 %   Platelet Count 204 150 - 400 K/uL   nRBC 0.7 (H) 0.0 - 0.2 %   Neutrophils Relative % 50 %    Comment: metas and myelos seen   Neutro Abs 6.1 1.7 - 7.7 K/uL   Lymphocytes Relative 15 %   Lymphs Abs 1.8 0.7 - 4.0 K/uL   Monocytes Relative 17 %   Monocytes Absolute 2.0 (H) 0.1 - 1.0 K/uL   Eosinophils Relative 0 %   Eosinophils Absolute 0.0 0.0 - 0.5 K/uL   Basophils Relative 0 %   Basophils Absolute 0.0 0.0 - 0.1 K/uL   Immature Granulocytes 18 %   Abs Immature Granulocytes 2.17 (H) 0.00 - 0.07 K/uL    Comment: Performed at [facility], [address], [place], [place] [address]  CBC with Differential/Platelet     Status: Abnormal   Collection Time: 06/04/21 12:00 AM  Result Value Ref Range   WBC 8.4 3.8 - 10.8 Thousand/uL   RBC 2.56 (L) 3.80 - 5.10 Million/uL   Hemoglobin 8.3 (L) 11.7 - 15.5 g/dL   HCT 24.9 (L) 35.0 - 45.0 %   MCV 97.3 80.0  - 100.0 fL   MCH 32.4 27.0 - 33.0 pg   MCHC 33.3 32.0 -  36.0 g/dL   RDW 16.3 (H) 11.0 - 15.0 %   Platelets 176 140 - 400 Thousand/uL   MPV 10.4 7.5 - 12.5 fL   Neutro Abs 6,737 1,500 - 7,800 cells/uL   Lymphs Abs 1,487 850 - 3,900 cells/uL   Absolute Monocytes 151 (L) 200 - 950 cells/uL   Eosinophils Absolute 8 (L) 15 - 500 cells/uL   Basophils Absolute 17 0 - 200 cells/uL   Neutrophils Relative % 80.2 %   Total Lymphocyte 17.7 %   Monocytes Relative 1.8 %   Eosinophils Relative 0.1 %   Basophils Relative 0.2 %   Smear Review      Comment: Review of peripheral smear confirms automated results.   COMPLETE METABOLIC PANEL WITH GFR     Status: Abnormal   Collection Time: 06/04/21 12:00 AM  Result Value Ref Range   Glucose, Bld 102 (H) 65 - 99 mg/dL    Comment: .            Fasting reference interval . For someone without known diabetes, a glucose value between 100 and 125 mg/dL is consistent with prediabetes and should be confirmed with a follow-up test. .    BUN 25 7 - 25 mg/dL   Creat 1.32 (H) 0.50 - 0.99 mg/dL    Comment: For patients >32 years of age, the reference limit for Creatinine is approximately 13% higher for people identified as African-American. .    GFR, Est Non African American 43 (L) > OR = 60 mL/min/1.20m   GFR, Est African American 50 (L) > OR = 60 mL/min/1.761m  BUN/Creatinine Ratio 19 6 - 22 (calc)   Sodium 136 135 - 146 mmol/L   Potassium 4.6 3.5 - 5.3 mmol/L   Chloride 102 98 - 110 mmol/L   CO2 23 20 - 32 mmol/L   Calcium 8.8 8.6 - 10.4 mg/dL   Total Protein 7.2 6.1 - 8.1 g/dL   Albumin 4.0 3.6 - 5.1 g/dL   Globulin 3.2 1.9 - 3.7 g/dL (calc)   AG Ratio 1.3 1.0 - 2.5 (calc)   Total Bilirubin 0.5 0.2 - 1.2 mg/dL   Alkaline phosphatase (APISO) 75 37 - 153 U/L   AST 12 10 - 35 U/L   ALT 9 6 - 29 U/L  TSH     Status: None   Collection Time: 06/04/21 12:00 AM  Result Value Ref Range   TSH 2.29 0.40 - 4.50 mIU/L  Magnesium     Status:  Abnormal   Collection Time: 06/04/21 12:00 AM  Result Value Ref Range   Magnesium 1.4 (L) 1.5 - 2.5 mg/dL  Phosphorus     Status: None   Collection Time: 06/04/21 12:00 AM  Result Value Ref Range   Phosphorus 3.2 2.5 - 4.5 mg/dL  Sample to Blood Bank     Status: None   Collection Time: 06/11/21  8:47 AM  Result Value Ref Range   Blood Bank Specimen SAMPLE AVAILABLE FOR TESTING    Sample Expiration      06/14/2021,2359 Performed at [id number] [place] [id number] CMP ([place])     Status: Abnormal   Collection Time: 06/11/21  8:47 AM  Result Value Ref Range   Sodium 138 135 - 145 mmol/L   Potassium 4.1 3.5 - 5.1 mmol/L   Chloride 107 98 - 111 mmol/L   CO2 20 (L) 22 - 32 mmol/L   Glucose, Bld 107 (H)  70 - 99 mg/dL    Comment: Glucose reference range applies only to samples taken after fasting for at least 8 hours.   BUN 18 8 - 23 mg/dL   Creatinine 1.62 (H) 0.44 - 1.00 mg/dL   Calcium 8.6 (L) 8.9 - 10.3 mg/dL   Total Protein 6.9 6.5 - 8.1 g/dL   Albumin 3.1 (L) 3.5 - 5.0 g/dL   AST 16 15 - 41 U/L   ALT 8 0 - 44 U/L   Alkaline Phosphatase 90 38 - 126 U/L   Total Bilirubin 0.3 0.3 - 1.2 mg/dL   GFR, Estimated 35 (L) >60 mL/min    Comment: (NOTE) Calculated using the CKD-EPI Creatinine Equation (2021)    Anion gap 11 5 - 15    Comment: Performed at [facility], [place] [address] [place], [place] [address]  CBC with Differential ([place] Only)     Status: Abnormal   Collection Time: 06/11/21  8:47 AM  Result Value Ref Range   WBC Count 2.6 (L) 4.0 - 10.5 K/uL   RBC 2.01 (L) 3.87 - 5.11 MIL/uL   Hemoglobin 6.6 (LL) 12.0 - 15.0 g/dL    Comment: This critical result has verified and been called to [facility], RN by Modena Slater on 06 29 2022 at 0920, and has been read back.    HCT 20.4 (L) 36.0 - 46.0 %   MCV 101.5 (H) 80.0 - 100.0 fL   MCH 33.3 26.0 - 34.0 pg   MCHC 32.8 30.0 - 36.0  g/dL   RDW 18.3 (H) 11.5 - 15.5 %   Platelet Count 108 (L) 150 - 400 K/uL   nRBC 7.4 (H) 0.0 - 0.2 %   Neutrophils Relative % 24 %   Neutro Abs 0.6 (L) 1.7 - 7.7 K/uL   Lymphocytes Relative 52 %   Lymphs Abs 1.4 0.7 - 4.0 K/uL   Monocytes Relative 20 %   Monocytes Absolute 0.5 0.1 - 1.0 K/uL   Eosinophils Relative 1 %   Eosinophils Absolute 0.0 0.0 - 0.5 K/uL   Basophils Relative 1 %   Basophils Absolute 0.0 0.0 - 0.1 K/uL   Immature Granulocytes 2 %   Abs Immature Granulocytes 0.04 0.00 - 0.07 K/uL    Comment: Performed at [facility], [place] [address], [place], [place] [address]  Prepare RBC (crossmatch)     Status: None   Collection Time: 06/11/21  9:30 AM  Result Value Ref Range   Order Confirmation      ORDER PROCESSED BY BLOOD BANK Performed at [facility], [place] [address], [place], [place] [address]   Type and screen     Status: None   Collection Time: 06/11/21  9:37 AM  Result Value Ref Range   ABO/RH(D) A POS    Antibody Screen NEG    Sample Expiration 06/14/2021,2359    Unit Number [id number]    Blood Component Type RED CELLS,LR    Unit division 00    Status of Unit ISSUED,FINAL    Transfusion Status OK TO TRANSFUSE    Crossmatch Result      Compatible Performed at [place] [address], [place], [place] [address]    Unit Number [id number]    Blood Component Type RED CELLS,LR    Unit division 00    Status of Unit ISSUED,FINAL    Transfusion Status OK TO TRANSFUSE    Crossmatch Result Compatible  BPAM RBC     Status: None   Collection Time: 06/11/21  9:37 AM  Result Value Ref Range   ISSUE DATE / TIME [id number]    Blood Product Unit Number [id number]    PRODUCT CODE [id number]    Unit Type and Rh 6200    Blood Product Expiration Date [id number]    ISSUING PHYSICIAN ^VERONESE^[place]    ISSUE DATE / TIME [id number]    Blood Product Unit Number [id number]    PRODUCT  CODE [id number]    Unit Type and Rh 6200    Blood Product Expiration Date [id number]    ISSUING PHYSICIAN ^VERONESE^[place]   CBC with Differential ([facility] Only)     Status: Abnormal   Collection Time: 06/25/21 11:40 AM  Result Value Ref Range   WBC Count 8.5 4.0 - 10.5 K/uL   RBC 3.36 (L) 3.87 - 5.11 MIL/uL   Hemoglobin 10.9 (L) 12.0 - 15.0 g/dL   HCT 33.3 (L) 36.0 - 46.0 %   MCV 99.1 80.0 - 100.0 fL   MCH 32.4 26.0 - 34.0 pg   MCHC 32.7 30.0 - 36.0 g/dL   RDW 17.2 (H) 11.5 - 15.5 %   Platelet Count 180 150 - 400 K/uL   nRBC 0.0 0.0 - 0.2 %   Neutrophils Relative % 54 %   Neutro Abs 4.6 1.7 - 7.7 K/uL   Lymphocytes Relative 26 %   Lymphs Abs 2.2 0.7 - 4.0 K/uL   Monocytes Relative 14 %   Monocytes Absolute 1.2 (H) 0.1 - 1.0 K/uL   Eosinophils Relative 2 %   Eosinophils Absolute 0.2 0.0 - 0.5 K/uL   Basophils Relative 1 %   Basophils Absolute 0.1 0.0 - 0.1 K/uL   Immature Granulocytes 3 %   Abs Immature Granulocytes 0.21 (H) 0.00 - 0.07 K/uL    Comment: Performed at [facility], [address] [place], [place] [address]  CMP ([place] only)     Status: Abnormal   Collection Time: 06/25/21 11:40 AM  Result Value Ref Range   Sodium 142 135 - 145 mmol/L   Potassium 4.7 3.5 - 5.1 mmol/L   Chloride 109 98 - 111 mmol/L   CO2 26 22 - 32 mmol/L   Glucose, Bld 69 (L) 70 - 99 mg/dL    Comment: Glucose reference range applies only to samples taken after fasting for at least 8 hours.   BUN 20 8 - 23 mg/dL   Creatinine 1.08 (H) 0.44 - 1.00 mg/dL   Calcium 9.2 8.9 - 10.3 mg/dL   Total Protein 7.3 6.5 - 8.1 g/dL   Albumin 3.2 (L) 3.5 - 5.0 g/dL   AST 14 (L) 15 - 41 U/L   ALT 9 0 - 44 U/L   Alkaline Phosphatase 92 38 - 126 U/L   Total Bilirubin <0.2 (L) 0.3 - 1.2 mg/dL   GFR, Estimated 58 (L) >60 mL/min    Comment: (NOTE) Calculated using the CKD-EPI Creatinine Equation (2021)    Anion gap 7 5 - 15    Comment: Performed at [facility], [place] [address] [place], [place] [address]  Sample to Blood Bank     Status: None   Collection Time: 06/25/21 11:40 AM  Result Value Ref Range   Blood Bank Specimen SAMPLE AVAILABLE FOR TESTING    Sample Expiration      06/28/2021,2359 Performed at [place] [address], [place], [place] [address]  Sample to Blood Bank     Status: None   Collection Time: 07/09/21 10:04 AM  Result Value Ref Range   Blood Bank Specimen SAMPLE AVAILABLE FOR TESTING    Sample Expiration      07/12/2021,2359 Performed at [facility], [place] [address] [place], [place] [address]   CMP ([place] only)     Status: Abnormal   Collection Time: 07/09/21 10:04 AM  Result Value Ref Range   Sodium 140 135 - 145 mmol/L   Potassium 4.2 3.5 - 5.1 mmol/L   Chloride 106 98 - 111 mmol/L   CO2 24 22 - 32 mmol/L   Glucose, Bld 102 (H) 70 - 99 mg/dL    Comment: Glucose reference range applies only to samples taken after fasting for at least 8 hours.   BUN 18 8 - 23 mg/dL   Creatinine 1.15 (H) 0.44 - 1.00 mg/dL   Calcium 9.3 8.9 - 10.3 mg/dL   Total Protein 7.2 6.5 - 8.1 g/dL   Albumin 3.5 3.5 - 5.0 g/dL   AST 18 15 - 41 U/L   ALT 9 0 - 44 U/L   Alkaline Phosphatase 122 38 - 126 U/L   Total Bilirubin 0.3 0.3 - 1.2 mg/dL   GFR, Estimated 54 (L) >60 mL/min    Comment: (NOTE) Calculated using the CKD-EPI Creatinine Equation (2021)    Anion gap 10 5 - 15    Comment: Performed at [facility], [place] [address] [place], [place] [address]  CBC with Differential ([place] Only)     Status: Abnormal   Collection Time: 07/09/21 10:04 AM  Result Value Ref Range   WBC Count 14.9 (H) 4.0 - 10.5 K/uL   RBC 3.08 (L) 3.87 - 5.11 MIL/uL   Hemoglobin 10.0 (L) 12.0 - 15.0 g/dL   HCT 31.0 (L) 36.0 - 46.0 %   MCV 100.6 (H) 80.0 - 100.0 fL   MCH 32.5 26.0 - 34.0 pg   MCHC 32.3 30.0 - 36.0 g/dL   RDW 17.7 (H) 11.5 - 15.5 %    Platelet Count 129 (L) 150 - 400 K/uL   nRBC 0.4 (H) 0.0 - 0.2 %   Neutrophils Relative % 60 %   Neutro Abs 8.9 (H) 1.7 - 7.7 K/uL   Lymphocytes Relative 19 %   Lymphs Abs 2.9 0.7 - 4.0 K/uL   Monocytes Relative 9 %   Monocytes Absolute 1.4 (H) 0.1 - 1.0 K/uL   Eosinophils Relative 3 %   Eosinophils Absolute 0.5 0.0 - 0.5 K/uL   Basophils Relative 1 %   Basophils Absolute 0.1 0.0 - 0.1 K/uL   WBC Morphology FULPHILA ADMIN ON 7.15.22    Immature Granulocytes 8 %    Comment: Increased IG's, likely caused by Bone Marrow Colony Stimulating Factor received within 30 days.   Abs Immature Granulocytes 1.23 (H) 0.00 - 0.07 K/uL    Comment: Performed at [facility], [place] [address], [place], [place] [address]  CBC with Differential ([place] Only)     Status: Abnormal   Collection Time: 07/23/21 11:36 AM  Result Value Ref Range   WBC Count 20.2 (H) 4.0 - 10.5 K/uL   RBC 2.39 (L) 3.87 - 5.11 MIL/uL   Hemoglobin 7.9 (L) 12.0 - 15.0 g/dL   HCT 24.4 (L) 36.0 - 46.0 %   MCV 102.1 (H) 80.0 - 100.0 fL   MCH 33.1 26.0 - 34.0 pg  MCHC 32.4 30.0 - 36.0 g/dL   RDW 18.6 (H) 11.5 - 15.5 %   Platelet Count 153 150 - 400 K/uL   nRBC 1.0 (H) 0.0 - 0.2 %   Neutrophils Relative % 64 %   Neutro Abs 12.9 (H) 1.7 - 7.7 K/uL   Lymphocytes Relative 12 %   Lymphs Abs 2.5 0.7 - 4.0 K/uL   Monocytes Relative 9 %   Monocytes Absolute 1.7 (H) 0.1 - 1.0 K/uL   Eosinophils Relative 0 %   Eosinophils Absolute 0.1 0.0 - 0.5 K/uL   Basophils Relative 0 %   Basophils Absolute 0.1 0.0 - 0.1 K/uL   WBC Morphology TOXIC GRANULATION    Immature Granulocytes 15 %    Comment: Increased IG's, likely caused by Bone Marrow Colony Stimulating Factor received within 30 days.   Abs Immature Granulocytes 2.98 (H) 0.00 - 0.07 K/uL   Polychromasia PRESENT     Comment: Performed at [facility], [place] [address] [place], [place] [address]  CMP ([place] only)      Status: Abnormal   Collection Time: 07/23/21 11:36 AM  Result Value Ref Range   Sodium 135 135 - 145 mmol/L   Potassium 3.9 3.5 - 5.1 mmol/L   Chloride 105 98 - 111 mmol/L   CO2 19 (L) 22 - 32 mmol/L   Glucose, Bld 101 (H) 70 - 99 mg/dL    Comment: Glucose reference range applies only to samples taken after fasting for at least 8 hours.   BUN 14 8 - 23 mg/dL   Creatinine 1.21 (H) 0.44 - 1.00 mg/dL   Calcium 9.1 8.9 - 10.3 mg/dL   Total Protein 7.3 6.5 - 8.1 g/dL   Albumin 3.1 (L) 3.5 - 5.0 g/dL   AST 20 15 - 41 U/L   ALT 13 0 - 44 U/L   Alkaline Phosphatase 133 (H) 38 - 126 U/L   Total Bilirubin 0.4 0.3 - 1.2 mg/dL   GFR, Estimated 50 (L) >60 mL/min    Comment: (NOTE) Calculated using the CKD-EPI Creatinine Equation (2021)    Anion gap 11 5 - 15    Comment: Performed at [facility], [place] [address], [place], [place] [address]  Sample to Blood Bank     Status: None   Collection Time: 07/28/21 12:33 PM  Result Value Ref Range   Blood Bank Specimen SAMPLE AVAILABLE FOR TESTING    Sample Expiration      07/31/2021,2359 Performed at [facility], [place] [address] [place], [place] [address]   CMP ([place] only)     Status: Abnormal   Collection Time: 07/28/21 12:33 PM  Result Value Ref Range   Sodium 140 135 - 145 mmol/L   Potassium 4.6 3.5 - 5.1 mmol/L   Chloride 105 98 - 111 mmol/L   CO2 26 22 - 32 mmol/L   Glucose, Bld 102 (H) 70 - 99 mg/dL    Comment: Glucose reference range applies only to samples taken after fasting for at least 8 hours.   BUN 24 (H) 8 - 23 mg/dL   Creatinine 1.07 (H) 0.44 - 1.00 mg/dL   Calcium 9.2 8.9 - 10.3 mg/dL   Total Protein 7.2 6.5 - 8.1 g/dL   Albumin 3.3 (L) 3.5 - 5.0 g/dL   AST 27 15 - 41 U/L   ALT 10 0 - 44 U/L   Alkaline Phosphatase 183 (H) 38 - 126 U/L  Total Bilirubin 0.6 0.3 - 1.2 mg/dL   GFR, Estimated 58 (L) >60 mL/min    Comment: (NOTE) Calculated using the CKD-EPI Creatinine  Equation (2021)    Anion gap 9 5 - 15    Comment: Performed at [facility], [address] [place], [place] [address]  CBC with Differential ([place] Only)     Status: Abnormal   Collection Time: 07/28/21 12:33 PM  Result Value Ref Range   WBC Count 32.0 (H) 4.0 - 10.5 K/uL   RBC 2.45 (L) 3.87 - 5.11 MIL/uL   Hemoglobin 8.0 (L) 12.0 - 15.0 g/dL   HCT 25.5 (L) 36.0 - 46.0 %   MCV 104.1 (H) 80.0 - 100.0 fL   MCH 32.7 26.0 - 34.0 pg   MCHC 31.4 30.0 - 36.0 g/dL   RDW 18.3 (H) 11.5 - 15.5 %   Platelet Count 95 (L) 150 - 400 K/uL   nRBC 0.0 0.0 - 0.2 %   Neutrophils Relative % 84 %   Neutro Abs 27.1 (H) 1.7 - 7.7 K/uL   Lymphocytes Relative 6 %   Lymphs Abs 1.9 0.7 - 4.0 K/uL   Monocytes Relative 1 %   Monocytes Absolute 0.2 0.1 - 1.0 K/uL   Eosinophils Relative 0 %   Eosinophils Absolute 0.1 0.0 - 0.5 K/uL   Basophils Relative 1 %   Basophils Absolute 0.2 (H) 0.0 - 0.1 K/uL   WBC Morphology DOHLE BODIES     Comment: HYPERSEGMENTED NEUT TOXIC GRANULATION    Smear Review LARGE PLATELETS FEW PLATELET CLUMPS    Immature Granulocytes 8 %    Comment: Increased IG's, likely caused by Bone Marrow Colony Stimulating Factor received within 30 days.   Abs Immature Granulocytes 2.60 (H) 0.00 - 0.07 K/uL   Schistocytes PRESENT     Comment: FEW   Tear Drop Cells PRESENT     Comment: FEW Performed at [facility], [address], [place], [place] [address]   Type and screen     Status: None   Collection Time: 07/28/21 12:36 PM  Result Value Ref Range   ABO/RH(D) A POS    Antibody Screen NEG    Sample Expiration 07/31/2021,2359    Unit Number [id number]    Blood Component Type RED CELLS,LR    Unit division 00    Status of Unit ISSUED,FINAL    Transfusion Status OK TO TRANSFUSE    Crossmatch Result Compatible    Unit Number [id number]    Blood Component Type RED CELLS,LR    Unit division 00    Status of Unit  ISSUED,FINAL    Transfusion Status OK TO TRANSFUSE    Crossmatch Result      Compatible Performed at [facility], [place] [address] [place], [place] [address]   Prepare RBC (crossmatch)     Status: None   Collection Time: 07/28/21 12:36 PM  Result Value Ref Range   Order Confirmation      ORDER PROCESSED BY BLOOD BANK Performed at [facility], [place] [address] [place], [place] [address]   BPAM RBC     Status: None   Collection Time: 07/28/21 12:36 PM  Result Value Ref Range   ISSUE DATE / TIME [id number]    Blood Product Unit Number [id number]    PRODUCT CODE [id number]    Unit Type and Rh [id number]    Blood Product Expiration Date [id number]    ISSUE DATE /  TIME [id number]    Blood Product Unit Number [id number]    PRODUCT CODE [id number]    Unit Type and Rh 6200    Blood Product Expiration Date [id number]   Prepare RBC (crossmatch)     Status: None   Collection Time: 07/29/21 12:00 PM  Result Value Ref Range   Order Confirmation      BB SAMPLE OR UNITS ALREADY AVAILABLE Performed at [place] [address], [place], [place] [address]   Sample to Blood Bank     Status: None   Collection Time: 08/06/21  8:01 AM  Result Value Ref Range   Blood Bank Specimen SAMPLE AVAILABLE FOR TESTING    Sample Expiration      08/09/2021,2359 Performed at [place] [address] [place], [place] [address]   CMP ([place] only)     Status: Abnormal   Collection Time: 08/06/21  8:01 AM  Result Value Ref Range   Sodium 135 135 - 145 mmol/L   Potassium 4.3 3.5 - 5.1 mmol/L   Chloride 104 98 - 111 mmol/L   CO2 21 (L) 22 - 32 mmol/L   Glucose, Bld 123 (H) 70 - 99 mg/dL    Comment: Glucose reference range applies only to samples taken after fasting for at least 8 hours.   BUN 20 8 - 23 mg/dL   Creatinine 1.36 (H) 0.44 - 1.00 mg/dL   Calcium 9.0 8.9 - 10.3 mg/dL   Total Protein 7.5 6.5 - 8.1  g/dL   Albumin 3.6 3.5 - 5.0 g/dL   AST 33 15 - 41 U/L   ALT 19 0 - 44 U/L   Alkaline Phosphatase 140 (H) 38 - 126 U/L   Total Bilirubin 0.5 0.3 - 1.2 mg/dL   GFR, Estimated 44 (L) >60 mL/min    Comment: (NOTE) Calculated using the CKD-EPI Creatinine Equation (2021)    Anion gap 10 5 - 15    Comment: Performed at [facility], [place] [address] [place] [address], [place], [place] [address]  CBC with Differential ([place] Only)     Status: Abnormal   Collection Time: 08/06/21  8:01 AM  Result Value Ref Range   WBC Count 26.6 (H) 4.0 - 10.5 K/uL   RBC 2.83 (L) 3.87 - 5.11 MIL/uL   Hemoglobin 9.4 (L) 12.0 - 15.0 g/dL   HCT 28.3 (L) 36.0 - 46.0 %   MCV 100.0 80.0 - 100.0 fL   MCH 33.2 26.0 - 34.0 pg   MCHC 33.2 30.0 - 36.0 g/dL   RDW 18.6 (H) 11.5 - 15.5 %   Platelet Count 77 (L) 150 - 400 K/uL   nRBC 1.3 (H) 0.0 - 0.2 %   Neutrophils Relative % 69 %   Neutro Abs 18.4 (H) 1.7 - 7.7 K/uL   Lymphocytes Relative 9 %   Lymphs Abs 2.3 0.7 - 4.0 K/uL   Monocytes Relative 6 %   Monocytes Absolute 1.7 (H) 0.1 - 1.0 K/uL   Eosinophils Relative 0 %   Eosinophils Absolute 0.0 0.0 - 0.5 K/uL   Basophils Relative 0 %   Basophils Absolute 0.0 0.0 - 0.1 K/uL   Smear Review LARGE PLATELETS    Immature Granulocytes 16 %    Comment: Increased IG's, likely caused by Bone Marrow Colony Stimulating Factor received within 30 days.   Abs Immature Granulocytes 4.15 (H) 0.00 - 0.07 K/uL   Polychromasia PRESENT     Comment: Performed at [facility], [place] [address] [place], [place] [address]    DG Chest 2 View  Result Date: 08/13/2021 CLINICAL DATA:  History of cancer in a 65 year old female with productive cough and headache for 1 month. EXAM: CHEST - 2 VIEW COMPARISON:  July 20, 2021. FINDINGS: RIGHT-sided Port-A-Cath remains in place, tip at the caval to atrial junction. Cardiomediastinal contours and hilar structures are stable. Lungs are clear aside from mild  interstitial prominence with subpleural pattern and RIGHT chest predominance that was seen on previous CT imaging and with decreased conspicuity since July 20, 2021. Subtle changes also noted at the LEFT lung base are slightly improved. There is no evidence of pneumothorax or sign of pleural effusion. Small nodular density projects over the lateral LEFT eighth rib on the frontal radiograph. On limited assessment there is no acute skeletal process. IMPRESSION: Interstitial changes in the chest with similar distribution when compared to prior imaging and with slight improvement since the study of July 20, 2021. Question confluence of shadows versus small nodule at the LEFT lung base measuring approximately 7 mm. In the context of potential recent infection or inflammation in the chest would suggest short interval follow-up for comparison to ensure resolution. Electronically Signed   By: Zetta Bills M.D.   On: 08/13/2021 13:53       All questions at time of visit were answered - patient instructed to contact office with any additional concerns or updates. ER/RTC precautions were reviewed with the patient as applicable.   Please note: manual typing as well as voice recognition software may have been used to produce this document - typos may escape review. Please contact Dr. Sheppard Coil for any needed clarifications.

## 2021-08-14 ENCOUNTER — Other Ambulatory Visit (HOSPITAL_COMMUNITY): Payer: Self-pay

## 2021-08-19 MED FILL — Dexamethasone Sodium Phosphate Inj 100 MG/10ML: INTRAMUSCULAR | Qty: 1 | Status: AC

## 2021-08-20 ENCOUNTER — Inpatient Hospital Stay: Payer: No Typology Code available for payment source

## 2021-08-20 ENCOUNTER — Other Ambulatory Visit: Payer: Self-pay

## 2021-08-20 ENCOUNTER — Inpatient Hospital Stay: Payer: No Typology Code available for payment source | Attending: Oncology

## 2021-08-20 ENCOUNTER — Other Ambulatory Visit: Payer: Self-pay | Admitting: *Deleted

## 2021-08-20 ENCOUNTER — Inpatient Hospital Stay (HOSPITAL_BASED_OUTPATIENT_CLINIC_OR_DEPARTMENT_OTHER): Payer: No Typology Code available for payment source | Admitting: Oncology

## 2021-08-20 VITALS — BP 151/85 | HR 89 | Temp 97.8°F | Resp 17 | Ht 66.0 in | Wt 172.5 lb

## 2021-08-20 DIAGNOSIS — D61810 Antineoplastic chemotherapy induced pancytopenia: Secondary | ICD-10-CM | POA: Diagnosis not present

## 2021-08-20 DIAGNOSIS — C669 Malignant neoplasm of unspecified ureter: Secondary | ICD-10-CM | POA: Diagnosis not present

## 2021-08-20 DIAGNOSIS — Z5111 Encounter for antineoplastic chemotherapy: Secondary | ICD-10-CM | POA: Diagnosis not present

## 2021-08-20 DIAGNOSIS — C801 Malignant (primary) neoplasm, unspecified: Secondary | ICD-10-CM

## 2021-08-20 DIAGNOSIS — Z95828 Presence of other vascular implants and grafts: Secondary | ICD-10-CM | POA: Diagnosis not present

## 2021-08-20 DIAGNOSIS — D701 Agranulocytosis secondary to cancer chemotherapy: Secondary | ICD-10-CM | POA: Insufficient documentation

## 2021-08-20 DIAGNOSIS — D630 Anemia in neoplastic disease: Secondary | ICD-10-CM | POA: Diagnosis present

## 2021-08-20 DIAGNOSIS — T451X5D Adverse effect of antineoplastic and immunosuppressive drugs, subsequent encounter: Secondary | ICD-10-CM | POA: Insufficient documentation

## 2021-08-20 DIAGNOSIS — Z5189 Encounter for other specified aftercare: Secondary | ICD-10-CM | POA: Insufficient documentation

## 2021-08-20 LAB — CMP (CANCER CENTER ONLY)
ALT: 13 U/L (ref 0–44)
AST: 20 U/L (ref 15–41)
Albumin: 3.4 g/dL — ABNORMAL LOW (ref 3.5–5.0)
Alkaline Phosphatase: 129 U/L — ABNORMAL HIGH (ref 38–126)
Anion gap: 12 (ref 5–15)
BUN: 15 mg/dL (ref 8–23)
CO2: 19 mmol/L — ABNORMAL LOW (ref 22–32)
Calcium: 8.8 mg/dL — ABNORMAL LOW (ref 8.9–10.3)
Chloride: 108 mmol/L (ref 98–111)
Creatinine: 1.28 mg/dL — ABNORMAL HIGH (ref 0.44–1.00)
GFR, Estimated: 47 mL/min — ABNORMAL LOW (ref >60)
Glucose, Bld: 114 mg/dL — ABNORMAL HIGH (ref 70–99)
Potassium: 4 mmol/L (ref 3.5–5.1)
Sodium: 139 mmol/L (ref 135–145)
Total Bilirubin: 0.3 mg/dL (ref 0.3–1.2)
Total Protein: 6.7 g/dL (ref 6.5–8.1)

## 2021-08-20 LAB — CBC WITH DIFFERENTIAL (CANCER CENTER ONLY)
Abs Immature Granulocytes: 2.26 10*3/uL — ABNORMAL HIGH (ref 0.00–0.07)
Basophils Absolute: 0.1 10*3/uL (ref 0.0–0.1)
Basophils Relative: 1 %
Eosinophils Absolute: 0.1 10*3/uL (ref 0.0–0.5)
Eosinophils Relative: 0 %
HCT: 23.8 % — ABNORMAL LOW (ref 36.0–46.0)
Hemoglobin: 7.7 g/dL — ABNORMAL LOW (ref 12.0–15.0)
Immature Granulocytes: 15 %
Lymphocytes Relative: 16 %
Lymphs Abs: 2.4 10*3/uL (ref 0.7–4.0)
MCH: 33.5 pg (ref 26.0–34.0)
MCHC: 32.4 g/dL (ref 30.0–36.0)
MCV: 103.5 fL — ABNORMAL HIGH (ref 80.0–100.0)
Monocytes Absolute: 1.2 10*3/uL — ABNORMAL HIGH (ref 0.1–1.0)
Monocytes Relative: 8 %
Neutro Abs: 9.2 10*3/uL — ABNORMAL HIGH (ref 1.7–7.7)
Neutrophils Relative %: 60 %
Platelet Count: 58 10*3/uL — ABNORMAL LOW (ref 150–400)
RBC: 2.3 MIL/uL — ABNORMAL LOW (ref 3.87–5.11)
RDW: 20.6 % — ABNORMAL HIGH (ref 11.5–15.5)
WBC Count: 15.1 10*3/uL — ABNORMAL HIGH (ref 4.0–10.5)
nRBC: 0.8 % — ABNORMAL HIGH (ref 0.0–0.2)

## 2021-08-20 LAB — SAMPLE TO BLOOD BANK

## 2021-08-20 LAB — PREPARE RBC (CROSSMATCH)

## 2021-08-20 MED ORDER — DIPHENHYDRAMINE HCL 25 MG PO CAPS
25.0000 mg | ORAL_CAPSULE | Freq: Once | ORAL | Status: AC
  Administered 2021-08-20: 25 mg via ORAL
  Filled 2021-08-20: qty 1

## 2021-08-20 MED ORDER — ACETAMINOPHEN 325 MG PO TABS
650.0000 mg | ORAL_TABLET | Freq: Once | ORAL | Status: AC
  Administered 2021-08-20: 650 mg via ORAL
  Filled 2021-08-20: qty 2

## 2021-08-20 MED ORDER — HEPARIN SOD (PORK) LOCK FLUSH 100 UNIT/ML IV SOLN
500.0000 [IU] | Freq: Every day | INTRAVENOUS | Status: AC | PRN
  Administered 2021-08-20: 500 [IU]

## 2021-08-20 MED ORDER — SODIUM CHLORIDE 0.9% IV SOLUTION
250.0000 mL | Freq: Once | INTRAVENOUS | Status: AC
  Administered 2021-08-20: 250 mL via INTRAVENOUS

## 2021-08-20 MED ORDER — SODIUM CHLORIDE 0.9% FLUSH
10.0000 mL | INTRAVENOUS | Status: AC | PRN
  Administered 2021-08-20: 10 mL

## 2021-08-20 MED ORDER — SODIUM CHLORIDE 0.9% FLUSH
10.0000 mL | Freq: Once | INTRAVENOUS | Status: AC
  Administered 2021-08-20: 10 mL

## 2021-08-20 NOTE — Patient Instructions (Signed)

## 2021-08-20 NOTE — Progress Notes (Signed)
Denies anyHematology and Oncology Follow Up Visit  Mary Stark [id number] 1956/04/11 65 y.o. 08/20/2021 8:38 AM [name], DOAlexander, [name], DO   [name]: 65 year old woman with stage IV adenocarcinoma of the bladder presented with pelvic adenopathy in 2021.     Prior Therapy:  Mary Stark is status post retroperitoneal lymph node biopsy completed on 10/24/2020 which showed metastatic carcinoma.  Mary Stark is status post repeat cystoscopy and bilateral ureteral stent exchange as well as resection of a bladder tumor on January 21, 2021.  A final pathology showed an adenocarcinoma.  Chemotherapy utilizing carboplatin and gemcitabine started on November 22, 2020.  Mary Stark completed 6 cycles of therapy on March 06, 2021.  Current therapy: FOLFOX chemotherapy started on Apr 15, 2021.  Mary Stark is here for cycle 9 of therapy.   Interim History: Mary Stark returns today for repeat evaluation.  Since the last visit, Mary Stark reports feeling reasonably well without any major complaints.  Mary Stark has reported some fatigue and dyspnea on exertion and occasional cough.  Mary Stark denies any nausea, vomiting or abdominal pain.  Mary Stark denies any worsening neuropathy.     Medications: Updated on review. Current Outpatient Medications  Medication Sig Dispense Refill   acetaminophen (TYLENOL) 500 MG tablet Take 1,000 mg by mouth every 8 (eight) hours as needed for moderate pain.      albuterol (PROAIR HFA) 108 (90 Base) MCG/ACT inhaler INHALE 1 TO 2 PUFFS BY MOUTH INTO THE LUNGS EVERY 4 HOURS AS NEEDED FOR WHEEZING OR SHORTNESS OF BREATH 8.5 g 1   amLODipine (NORVASC) 5 MG tablet Take 5 mg by mouth daily.     ARIPiprazole (ABILIFY) 10 MG tablet TAKE 1 TABLET BY MOUTH ONCE DAILY 90 tablet 0   ARIPiprazole 10 MG TABS Take 10 mg by mouth daily. 90 tablet 1   ascorbic acid (VITAMIN C) 500 MG tablet Take 1,000 mg by mouth every evening.      buPROPion (WELLBUTRIN XL) 300 MG 24 hr tablet TAKE 1 TABLET BY MOUTH EVERY DAY  30 tablet 3   cephALEXin (KEFLEX) 250 MG capsule TAKE ONE CAPSULE BY MOUTH NIGHTLY 90 capsule 1   Cholecalciferol (VITAMIN D) 125 MCG (5000 UT) CAPS Take 5,000 Units by mouth every evening.      clonazePAM (KLONOPIN) 0.5 MG tablet TAKE 1 TABLET BY MOUTH AT BEDTIME 30 tablet 0   Coenzyme Q10 (CO Q-10) 200 MG CAPS Take 200 mg by mouth every evening.      fluticasone-salmeterol (ADVAIR HFA) 115-21 MCG/ACT inhaler Inhale 2 puffs into the lungs 2 (two) times daily. 1 each 0   gabapentin (NEURONTIN) 300 MG capsule TAKE 1 CAPSULE BY MOUTH IN THE MORNING, 1 CAP IN THE AFTERNOON & 4 CAPS AT NIGHT 180 capsule 3   GLUCOSAMINE-CHONDROITIN PO Take 2 tablets by mouth every evening.      lidocaine-prilocaine (EMLA) cream Apply 1 application topically as needed. 30 g 0   megestrol (MEGACE) 40 MG tablet Take 1 tablet (40 mg total) by mouth 2 (two) times daily. 60 tablet 1   Meth-Hyo-M Bl-Na Phos-Ph Sal (URIBEL) 118 MG CAPS Take one capsule by mouth 3 times daily 30 capsule 3   mirabegron ER (MYRBETRIQ) 25 MG TB24 tablet TAKE 1 TABLET BY MOUTH ONCE A DAY 30 tablet 3   Nebivolol HCl 20 MG TABS Take 0.5 tablets (10 mg total) by mouth daily. 90 tablet 2   Omega-3 Fatty Acids (FISH OIL) 1200 MG CAPS Take 1,200 mg by mouth every evening.  omeprazole (PRILOSEC) 20 MG capsule Take 1 capsule (20 mg total) by mouth daily. 90 capsule 3   ondansetron (ZOFRAN) 4 MG tablet Take 1 tablet (4 mg total) by mouth every 8 (eight) hours as needed for nausea or vomiting. 40 tablet 3   oxybutynin (DITROPAN) 5 MG tablet TAKE 1 TABLET BY MOUTH EVERY 8 HOURS AS NEEDED 90 tablet 3   oxymetazoline (AFRIN) 0.05 % nasal spray Place 1 spray into both nostrils 2 (two) times daily as needed for congestion.     Phenazopyridine HCl (AZO URINARY PAIN PO) Take 1 tablet by mouth 3 (three) times daily as needed (bladder spasms).     simvastatin (ZOCOR) 20 MG tablet TAKE 1 TABLET (20 MG TOTAL) BY MOUTH DAILY. 90 tablet 3   tamsulosin (FLOMAX) 0.4  MG CAPS capsule TAKE 1 CAPSULE BY MOUTH AT BEDTIME 30 capsule 3   thiamine 100 MG tablet Take 1 tablet (100 mg total) by mouth daily. 30 tablet 0   vitamin B-12 (CYANOCOBALAMIN) 1000 MCG tablet Take 2,000 mcg by mouth every evening.      Vitamin E 180 MG (400 UNIT) CAPS Take 400 Units by mouth every evening.      No current facility-administered medications for this visit.     Allergies:  Allergies  Allergen Reactions   Metformin And Related Other (See Comments)    Lactic acidosis    Penicillins Hives and Rash    Reports hives to penicillin and amoxicillin as an adult "a long time ago." Has tolerated cefdinir (09/2020) and cefepime (10/2020) before.   Metformin     NDC Code:[id number] NDC [id number]   Penicillin G Sodium     NDC WE:[id number] NDC KA:[id number] NDC WE:[id number]      Physical Exam:       Blood pressure (!) 151/85, pulse 89, temperature 97.8 F (36.6 C), temperature source Oral, resp. rate 17, height '5\' 6"'$  (1.676 m), weight 172 lb 8 oz (78.2 kg), SpO2 97 %.       ECOG: 1    General appearance: Comfortable appearing without any discomfort Head: Normocephalic without any trauma Oropharynx: Mucous membranes are moist and pink without any thrush or ulcers. Eyes: Pupils are equal and round reactive to light. Lymph nodes: No cervical, supraclavicular, inguinal or axillary lymphadenopathy.   Heart:regular rate and rhythm.  S1 and S2 without leg edema. Lung: Clear without any rhonchi or wheezes.  No dullness to percussion. Abdomin: Soft, nontender, nondistended with good bowel sounds.  No hepatosplenomegaly. Musculoskeletal: No joint deformity or effusion.  Full range of motion noted. Neurological: No deficits noted on motor, sensory and deep tendon reflex exam. Skin: No petechial rash or dryness.  Appeared moist.                Lab Results: Lab Results  Component Value Date   WBC 26.6 (H) 08/06/2021   HGB 9.4 (L)  08/06/2021   HCT 28.3 (L) 08/06/2021   MCV 100.0 08/06/2021   PLT 77 (L) 08/06/2021     Chemistry      Component Value Date/Time   NA 135 08/06/2021 0801   NA 142 04/26/2018 1333   K 4.3 08/06/2021 0801   CL 104 08/06/2021 0801   CO2 21 (L) 08/06/2021 0801   BUN 20 08/06/2021 0801   BUN 22 04/26/2018 1333   CREATININE 1.36 (H) 08/06/2021 0801   CREATININE 1.32 (H) 06/04/2021 0000   GLU 87 02/07/2013 0000      Component Value Date/Time  CALCIUM 9.0 08/06/2021 0801   ALKPHOS 140 (H) 08/06/2021 0801   AST 33 08/06/2021 0801   ALT 19 08/06/2021 0801   BILITOT 0.5 08/06/2021 0801       Impression and Plan:    65 year old woman with:   1.    Bladder cancer diagnosed in 2021.  Mary Stark presented with stage IV adenocarcinoma and pelvic adenopathy.  Her disease status was updated at this time and treatment choices were reviewed.  Risks and benefits of continuing chemotherapy were discussed.  Complications include nausea, vomiting, mild suppression, neutropenia and possible sepsis were reiterated.  The plan is to update her staging scans before the next visit.  Chemotherapy will be delayed given her anemia and thrombocytopenia.   2.  IV access: Port-A-Cath remains in use without any issues.   3.  Antiemetics: No nausea or vomiting noted at this time.  Mary Stark is currently on Zofran which has been effective.   4.  Renal function surveillance: Kidney function remained stable and will continue to be monitored.   5.  Goals of care: Therapy remains palliative although aggressive measures are warranted at this time.   6.  Anemia: Mary Stark has required supportive transfusions given her symptomatic anemia.  Her anemia is related to malignancy.  Laboratory data from today reviewed and showed a hemoglobin that has dropped further.  Risks and benefits of repeat transfusion were reviewed.  7.  Neutropenia: Mary Stark is receiving growth factor support after each cycle of therapy.  8.  Thrombocytopenia:  Related to oxaliplatin and dose adjustments in oxaliplatin will remedy this issue in the future.  Her platelet count remains low although no active bleeding is noted.   9.  Follow-up: Mary Stark will return in 2 weeks for the next cycle of therapy.   30  minutes were spent on this visit.  The time was dedicated to reviewing laboratory data, disease status update, discussing treatment choices and addressing complication related to cancer and cancer therapy.   Zola Button, MD 9/7/20228:38 AM

## 2021-08-21 ENCOUNTER — Ambulatory Visit: Payer: No Typology Code available for payment source | Admitting: Osteopathic Medicine

## 2021-08-21 LAB — TYPE AND SCREEN
ABO/RH(D): A POS
Antibody Screen: NEGATIVE
Unit division: 0
Unit division: 0

## 2021-08-21 LAB — BPAM RBC
Blood Product Expiration Date: 202210052359
Blood Product Expiration Date: 202210052359
ISSUE DATE / TIME: 202209071119
ISSUE DATE / TIME: 202209071119
Unit Type and Rh: 6200
Unit Type and Rh: 6200

## 2021-08-22 ENCOUNTER — Ambulatory Visit (HOSPITAL_COMMUNITY)
Admission: RE | Admit: 2021-08-22 | Discharge: 2021-08-22 | Disposition: A | Discharge status: Home / Self Care | Payer: No Typology Code available for payment source | Source: Ambulatory Visit | Attending: Oncology | Admitting: Oncology

## 2021-08-22 ENCOUNTER — Telehealth: Payer: Self-pay | Admitting: Oncology

## 2021-08-22 ENCOUNTER — Other Ambulatory Visit: Payer: Self-pay

## 2021-08-22 DIAGNOSIS — C669 Malignant neoplasm of unspecified ureter: Secondary | ICD-10-CM | POA: Diagnosis not present

## 2021-08-22 MED ORDER — IOHEXOL 350 MG/ML SOLN
80.0000 mL | Freq: Once | INTRAVENOUS | Status: AC | PRN
  Administered 2021-08-22: 80 mL via INTRAVENOUS

## 2021-08-22 NOTE — Telephone Encounter (Signed)
Called patient regarding upcoming September and October appointments, patient is notified.  

## 2021-08-26 ENCOUNTER — Other Ambulatory Visit: Payer: Self-pay | Admitting: Osteopathic Medicine

## 2021-08-26 ENCOUNTER — Other Ambulatory Visit (HOSPITAL_COMMUNITY): Payer: Self-pay

## 2021-08-26 MED ORDER — BUPROPION HCL ER (XL) 300 MG PO TB24
ORAL_TABLET | Freq: Every day | ORAL | 3 refills | Status: DC
  Filled 2021-08-26: qty 30, 30d supply, fill #0
  Filled 2021-09-25: qty 30, 30d supply, fill #1
  Filled 2021-10-28: qty 30, 30d supply, fill #2
  Filled 2021-11-27: qty 30, 30d supply, fill #3

## 2021-09-01 ENCOUNTER — Other Ambulatory Visit (HOSPITAL_COMMUNITY): Payer: Self-pay

## 2021-09-02 ENCOUNTER — Telehealth: Payer: Self-pay | Admitting: Osteopathic Medicine

## 2021-09-02 DIAGNOSIS — I10 Essential (primary) hypertension: Secondary | ICD-10-CM

## 2021-09-02 MED FILL — Dexamethasone Sodium Phosphate Inj 100 MG/10ML: INTRAMUSCULAR | Qty: 1 | Status: AC

## 2021-09-02 NOTE — Telephone Encounter (Signed)
This med is managed by Cardiology. Pt aware via VM of this and that she would need to contact their office for refills. Advised her to call back with any further questions or concerns.

## 2021-09-02 NOTE — Telephone Encounter (Signed)
Pt called. She needs a refill on her Nebivol 10 mg.  Thank you.

## 2021-09-03 ENCOUNTER — Inpatient Hospital Stay: Payer: No Typology Code available for payment source

## 2021-09-03 ENCOUNTER — Inpatient Hospital Stay (HOSPITAL_BASED_OUTPATIENT_CLINIC_OR_DEPARTMENT_OTHER): Payer: No Typology Code available for payment source | Admitting: Oncology

## 2021-09-03 ENCOUNTER — Encounter: Payer: Self-pay | Admitting: Osteopathic Medicine

## 2021-09-03 ENCOUNTER — Other Ambulatory Visit: Payer: Self-pay

## 2021-09-03 VITALS — BP 163/90 | HR 63 | Temp 97.9°F | Resp 17 | Ht 66.0 in | Wt 174.3 lb

## 2021-09-03 DIAGNOSIS — C669 Malignant neoplasm of unspecified ureter: Secondary | ICD-10-CM

## 2021-09-03 DIAGNOSIS — C801 Malignant (primary) neoplasm, unspecified: Secondary | ICD-10-CM

## 2021-09-03 DIAGNOSIS — Z95828 Presence of other vascular implants and grafts: Secondary | ICD-10-CM

## 2021-09-03 DIAGNOSIS — Z5111 Encounter for antineoplastic chemotherapy: Secondary | ICD-10-CM | POA: Diagnosis not present

## 2021-09-03 LAB — CBC WITH DIFFERENTIAL (CANCER CENTER ONLY)
Abs Immature Granulocytes: 0.07 10*3/uL (ref 0.00–0.07)
Basophils Absolute: 0.1 10*3/uL (ref 0.0–0.1)
Basophils Relative: 1 %
Eosinophils Absolute: 0.1 10*3/uL (ref 0.0–0.5)
Eosinophils Relative: 2 %
HCT: 33.8 % — ABNORMAL LOW (ref 36.0–46.0)
Hemoglobin: 10.9 g/dL — ABNORMAL LOW (ref 12.0–15.0)
Immature Granulocytes: 1 %
Lymphocytes Relative: 20 %
Lymphs Abs: 1.7 10*3/uL (ref 0.7–4.0)
MCH: 32.6 pg (ref 26.0–34.0)
MCHC: 32.2 g/dL (ref 30.0–36.0)
MCV: 101.2 fL — ABNORMAL HIGH (ref 80.0–100.0)
Monocytes Absolute: 1.1 10*3/uL — ABNORMAL HIGH (ref 0.1–1.0)
Monocytes Relative: 14 %
Neutro Abs: 5.2 10*3/uL (ref 1.7–7.7)
Neutrophils Relative %: 62 %
Platelet Count: 130 10*3/uL — ABNORMAL LOW (ref 150–400)
RBC: 3.34 MIL/uL — ABNORMAL LOW (ref 3.87–5.11)
RDW: 21.2 % — ABNORMAL HIGH (ref 11.5–15.5)
WBC Count: 8.2 10*3/uL (ref 4.0–10.5)
nRBC: 0 % (ref 0.0–0.2)

## 2021-09-03 LAB — CMP (CANCER CENTER ONLY)
ALT: 9 U/L (ref 0–44)
AST: 19 U/L (ref 15–41)
Albumin: 3.7 g/dL (ref 3.5–5.0)
Alkaline Phosphatase: 98 U/L (ref 38–126)
Anion gap: 10 (ref 5–15)
BUN: 25 mg/dL — ABNORMAL HIGH (ref 8–23)
CO2: 22 mmol/L (ref 22–32)
Calcium: 9.2 mg/dL (ref 8.9–10.3)
Chloride: 105 mmol/L (ref 98–111)
Creatinine: 1.18 mg/dL — ABNORMAL HIGH (ref 0.44–1.00)
GFR, Estimated: 52 mL/min — ABNORMAL LOW (ref >60)
Glucose, Bld: 97 mg/dL (ref 70–99)
Potassium: 4.6 mmol/L (ref 3.5–5.1)
Sodium: 137 mmol/L (ref 135–145)
Total Bilirubin: 0.5 mg/dL (ref 0.3–1.2)
Total Protein: 7.3 g/dL (ref 6.5–8.1)

## 2021-09-03 LAB — SAMPLE TO BLOOD BANK

## 2021-09-03 MED ORDER — OXALIPLATIN CHEMO INJECTION 100 MG/20ML
64.0000 mg/m2 | Freq: Once | INTRAVENOUS | Status: AC
  Administered 2021-09-03: 130 mg via INTRAVENOUS
  Filled 2021-09-03: qty 20

## 2021-09-03 MED ORDER — PALONOSETRON HCL INJECTION 0.25 MG/5ML
0.2500 mg | Freq: Once | INTRAVENOUS | Status: AC
  Administered 2021-09-03: 0.25 mg via INTRAVENOUS
  Filled 2021-09-03: qty 5

## 2021-09-03 MED ORDER — HEPARIN SOD (PORK) LOCK FLUSH 100 UNIT/ML IV SOLN
250.0000 [IU] | Freq: Once | INTRAVENOUS | Status: DC | PRN
Start: 2021-09-03 — End: 2021-09-03

## 2021-09-03 MED ORDER — SODIUM CHLORIDE 0.9 % IV SOLN
10.0000 mg | Freq: Once | INTRAVENOUS | Status: AC
  Administered 2021-09-03: 10 mg via INTRAVENOUS
  Filled 2021-09-03: qty 1
  Filled 2021-09-03: qty 10

## 2021-09-03 MED ORDER — HEPARIN SOD (PORK) LOCK FLUSH 100 UNIT/ML IV SOLN
500.0000 [IU] | Freq: Once | INTRAVENOUS | Status: DC | PRN
Start: 2021-09-03 — End: 2021-09-03

## 2021-09-03 MED ORDER — ALTEPLASE 2 MG IJ SOLR: 2.0000 mg | Freq: Once | INTRAMUSCULAR | Status: DC | PRN

## 2021-09-03 MED ORDER — FLUOROURACIL CHEMO INJECTION 2.5 GM/50ML
400.0000 mg/m2 | Freq: Once | INTRAVENOUS | Status: AC
  Administered 2021-09-03: 800 mg via INTRAVENOUS
  Filled 2021-09-03: qty 16

## 2021-09-03 MED ORDER — FLUOROURACIL CHEMO INJECTION 5 GM/100ML
5000.0000 mg | INTRAVENOUS | Status: DC
  Administered 2021-09-03: 5000 mg via INTRAVENOUS
  Filled 2021-09-03: qty 100

## 2021-09-03 MED ORDER — NEBIVOLOL HCL 20 MG PO TABS: 10.0000 mg | ORAL_TABLET | Freq: Every day | ORAL | 3 refills | Status: DC

## 2021-09-03 MED ORDER — SODIUM CHLORIDE 0.9% FLUSH: 10.0000 mL | INTRAVENOUS | Status: DC | PRN

## 2021-09-03 MED ORDER — DEXTROSE 5 % IV SOLN: Freq: Once | INTRAVENOUS | Status: AC

## 2021-09-03 MED ORDER — SODIUM CHLORIDE 0.9% FLUSH: 3.0000 mL | INTRAVENOUS | Status: DC | PRN

## 2021-09-03 MED ORDER — SODIUM CHLORIDE 0.9% FLUSH
10.0000 mL | Freq: Once | INTRAVENOUS | Status: AC
  Administered 2021-09-03: 10 mL

## 2021-09-03 MED ORDER — LEUCOVORIN CALCIUM INJECTION 350 MG
400.0000 mg/m2 | Freq: Once | INTRAVENOUS | Status: AC
  Administered 2021-09-03: 804 mg via INTRAVENOUS
  Filled 2021-09-03: qty 40.2

## 2021-09-03 NOTE — Telephone Encounter (Signed)
Kween called back and left a message stating this was prescribed by the hospital doctor. She needs a refill on Nebivolol. She doesn't have a cardiologist.

## 2021-09-03 NOTE — Progress Notes (Signed)
Denies anyHematology and Oncology Follow Up Visit  Mary Stark [id number] [birth year] 65 y.o. 09/03/2021 11:32 AM [name], DOAlexander, [name], DO   [name]: 65 year old woman with bladder cancer diagnosed in 2021.  She was found to have stage IV adenocarcinoma with pelvic adenopathy.   Prior Therapy:  She is status post retroperitoneal lymph node biopsy completed on 10/24/2020 which showed metastatic carcinoma.  She is status post repeat cystoscopy and bilateral ureteral stent exchange as well as resection of a bladder tumor on January 21, 2021.  A final pathology showed an adenocarcinoma.  Chemotherapy utilizing carboplatin and gemcitabine started on November 22, 2020.  She completed 6 cycles of therapy on March 06, 2021.  Current therapy: FOLFOX chemotherapy started on Apr 15, 2021.  He is here for cycle 9 of therapy.   Interim History: Ms. Test is here for repeat follow-up.  Since the last visit, she has received packed red cell transfusion instead of all treatment and her chemotherapy cycle delayed till today.  Since that time, she reports feeling well without any major complaints at this time.  She denies any nausea or vomiting or abdominal pain.  She denies any worsening neuropathy or cold sensitivity.  He has resumed all activities of daily living.     Medications: Reviewed without changes. Current Outpatient Medications  Medication Sig Dispense Refill   acetaminophen (TYLENOL) 500 MG tablet Take 1,000 mg by mouth every 8 (eight) hours as needed for moderate pain.      albuterol (PROAIR HFA) 108 (90 Base) MCG/ACT inhaler INHALE 1 TO 2 PUFFS BY MOUTH INTO THE LUNGS EVERY 4 HOURS AS NEEDED FOR WHEEZING OR SHORTNESS OF BREATH 8.5 g 1   amLODipine (NORVASC) 5 MG tablet Take 5 mg by mouth daily.     ARIPiprazole (ABILIFY) 10 MG tablet TAKE 1 TABLET BY MOUTH ONCE DAILY 90 tablet 0   ARIPiprazole 10 MG TABS Take 10 mg by mouth daily. 90 tablet 1    ascorbic acid (VITAMIN C) 500 MG tablet Take 1,000 mg by mouth every evening.      buPROPion (WELLBUTRIN XL) 300 MG 24 hr tablet TAKE 1 TABLET BY MOUTH EVERY DAY 30 tablet 3   cephALEXin (KEFLEX) 250 MG capsule TAKE ONE CAPSULE BY MOUTH NIGHTLY 90 capsule 1   Cholecalciferol (VITAMIN D) 125 MCG (5000 UT) CAPS Take 5,000 Units by mouth every evening.      clonazePAM (KLONOPIN) 0.5 MG tablet TAKE 1 TABLET BY MOUTH AT BEDTIME 30 tablet 0   Coenzyme Q10 (CO Q-10) 200 MG CAPS Take 200 mg by mouth every evening.      fluticasone-salmeterol (ADVAIR HFA) 115-21 MCG/ACT inhaler Inhale 2 puffs into the lungs 2 (two) times daily. 1 each 0   gabapentin (NEURONTIN) 300 MG capsule TAKE 1 CAPSULE BY MOUTH IN THE MORNING, 1 CAPSULE IN THE AFTERNOON & 4 CAPSULES AT NIGHT 180 capsule 3   GLUCOSAMINE-CHONDROITIN PO Take 2 tablets by mouth every evening.      lidocaine-prilocaine (EMLA) cream Apply 1 application topically as needed. 30 g 0   megestrol (MEGACE) 40 MG tablet Take 1 tablet (40 mg total) by mouth 2 (two) times daily. 60 tablet 1   Meth-Hyo-M Bl-Na Phos-Ph Sal (URIBEL) 118 MG CAPS Take one capsule by mouth 3 times daily 30 capsule 3   mirabegron ER (MYRBETRIQ) 25 MG TB24 tablet TAKE 1 TABLET BY MOUTH ONCE A DAY 30 tablet 3   Nebivolol HCl 20 MG TABS Take 0.5 tablets (10  mg total) by mouth daily. 90 tablet 2   Omega-3 Fatty Acids (FISH OIL) 1200 MG CAPS Take 1,200 mg by mouth every evening.      omeprazole (PRILOSEC) 20 MG capsule Take 1 capsule (20 mg total) by mouth daily. 90 capsule 3   ondansetron (ZOFRAN) 4 MG tablet Take 1 tablet (4 mg total) by mouth every 8 (eight) hours as needed for nausea or vomiting. 40 tablet 3   oxybutynin (DITROPAN) 5 MG tablet TAKE 1 TABLET BY MOUTH EVERY 8 HOURS AS NEEDED 90 tablet 3   oxymetazoline (AFRIN) 0.05 % nasal spray Place 1 spray into both nostrils 2 (two) times daily as needed for congestion.     Phenazopyridine HCl (AZO URINARY PAIN PO) Take 1 tablet by mouth 3  (three) times daily as needed (bladder spasms).     simvastatin (ZOCOR) 20 MG tablet TAKE 1 TABLET (20 MG TOTAL) BY MOUTH DAILY. 90 tablet 3   tamsulosin (FLOMAX) 0.4 MG CAPS capsule TAKE 1 CAPSULE BY MOUTH AT BEDTIME 30 capsule 3   thiamine 100 MG tablet Take 1 tablet (100 mg total) by mouth daily. 30 tablet 0   vitamin B-12 (CYANOCOBALAMIN) 1000 MCG tablet Take 2,000 mcg by mouth every evening.      Vitamin E 180 MG (400 UNIT) CAPS Take 400 Units by mouth every evening.      No current facility-administered medications for this visit.   Facility-Administered Medications Ordered in Other Visits  Medication Dose Route Frequency Provider Last Rate Last Admin   sodium chloride flush (NS) 0.9 % injection 10 mL  10 mL Intracatheter Once Wyatt Portela, MD         Allergies:  Allergies  Allergen Reactions   Metformin And Related Other (See Comments)    Lactic acidosis    Penicillins Hives and Rash    Reports hives to penicillin and amoxicillin as an adult "a long time ago." Has tolerated cefdinir (09/2020) and cefepime (10/2020) before.   Metformin     NDC Code:[id number] NDC BMWU:[id number]   Penicillin G Sodium     NDC ZDGU:[id number] NDC GLOV:[id number] NDC CZYS:[id number]      Physical Exam:     Blood pressure (!) 163/90, pulse 63, temperature 97.9 F (36.6 C), temperature source Oral, resp. rate 17, height 5\' 6"  (1.676 m), weight 174 lb 4.8 oz (79.1 kg), SpO2 94 %.          ECOG: 1   General appearance: Alert, awake without any distress. Head: Atraumatic without abnormalities Oropharynx: Without any thrush or ulcers. Eyes: No scleral icterus. Lymph nodes: No lymphadenopathy noted in the cervical, supraclavicular, or axillary nodes Heart:regular rate and rhythm, without any murmurs or gallops.   Lung: Clear to auscultation without any rhonchi, wheezes or dullness to percussion. Abdomin: Soft, nontender without any shifting dullness or  ascites. Musculoskeletal: No clubbing or cyanosis. Neurological: No motor or sensory deficits. Skin: No rashes or lesions.                Lab Results: Lab Results  Component Value Date   WBC 15.1 (H) 08/20/2021   HGB 7.7 (L) 08/20/2021   HCT 23.8 (L) 08/20/2021   MCV 103.5 (H) 08/20/2021   PLT 58 (L) 08/20/2021     Chemistry      Component Value Date/Time   NA 139 08/20/2021 0910   NA 142 04/26/2018 1333   K 4.0 08/20/2021 0910   CL 108 08/20/2021 0910   CO2 19 (L) 08/20/2021  0910   BUN 15 08/20/2021 0910   BUN 22 04/26/2018 1333   CREATININE 1.28 (H) 08/20/2021 0910   CREATININE 1.32 (H) 06/04/2021 0000   GLU 87 02/07/2013 0000      Component Value Date/Time   CALCIUM 8.8 (L) 08/20/2021 0910   ALKPHOS 129 (H) 08/20/2021 0910   AST 20 08/20/2021 0910   ALT 13 08/20/2021 0910   BILITOT 0.3 08/20/2021 0910     IMPRESSION: 1. Interval decrease in size of numerous enlarged, stranded appearing retroperitoneal lymph nodes, consistent with treatment response of nodal metastatic disease. 2. Redemonstrated bilateral double-J ureteral stents with formed pigtails in the renal pelves and urinary bladder. Unchanged soft tissue thickening about the right renal pelvis in keeping with urothelial malignancy. 3. Unchanged moderate left hydronephrosis. 4. Unchanged 5 mm nodule of the dependent right lower lobe, which remains nonspecific although most likely benign and incidental sequelae of prior infection or inflammation. Continued attention on follow-up. 5. Redemonstrated peripheral irregular ground-glass opacity throughout the lungs, with a bibasilar predominance, featuring arcing, bandlike subpleural elements and subpleural sparing. As on prior, findings are consistent with mild fibrotic sequelae of prior COVID pneumonia.   Aortic Atherosclerosis (ICD10-I70.0).  Impression and Plan:    65 year old woman with:   1.    Stage IV adenocarcinoma of the bladder  diagnosed in 2021 after presenting with pelvic adenopathy.  She is currently on FOLFOX with reasonable tolerance.  CT scan on August 22, 2021 was personally reviewed and showed excellent response to therapy.  Risks and benefits of resuming chemotherapy were discussed at this time.  Complications that include nausea, vomiting, myelosuppression and worsening neuropathy in addition to cold sensitivity were reviewed.  At this time, she is agreeable to continue the plan is to complete 12 cycles of therapy and consider treatment options at that time.  Active surveillance versus maintenance 5-FU could be considered.   2.  IV access: Port-A-Cath currently in use without any issues.   3.  Antiemetics: Compazine and Zofran available to her without any worsening nausea or vomiting.   4.  Renal function surveillance: Creatinine clearance remains close to baseline at this time.  No worsening disease noted.   5.  Goals of care: Her disease is incurable although aggressive measures are warranted given her reasonable performance status.   6.  Anemia: She received packed red cell transfusion on August 20, 2021.  Her hemoglobin is improved at this time and does not require any intervention.  7.  Neutropenia: She will continue to receive growth factor support after each cycle of therapy.  8.  Thrombocytopenia: Due to oxaliplatin.  Subsequent chemotherapy cycle will be at a reduced dose.  Platelet count has recovered and does not require any intervention or dose interruption.   9.  Follow-up: In 2 weeks for the next cycle of therapy.   30  minutes were dedicated to this encounter.  The time spent on reviewing laboratory data, disease status update, reviewing imaging studies and future plan of care discussion.   Zola Button, MD 9/21/202211:32 AM

## 2021-09-03 NOTE — Addendum Note (Signed)
Addended by: Maryla Morrow on: 09/03/2021 04:24 PM   Modules accepted: Orders

## 2021-09-03 NOTE — Patient Instructions (Signed)
[facility]  Discharge Instructions: Thank you for choosing [facility] to provide your oncology and hematology care.   If you have a lab appointment with the [facility], please go directly to the [facility] and check in at the registration area.   Wear comfortable clothing and clothing appropriate for easy access to any Portacath or PICC line.   We strive to give you quality time with your provider. You may need to reschedule your appointment if you arrive late (15 or more minutes).  Arriving late affects you and other patients whose appointments are after yours.  Also, if you miss three or more appointments without notifying the office, you may be dismissed from the clinic at the provider's discretion.      For prescription refill requests, have your pharmacy contact our office and allow 72 hours for refills to be completed.    Today you received the following chemotherapy and/or immunotherapy agents oxaliplatin, leucovorin, fluorourcil      To help prevent nausea and vomiting after your treatment, we encourage you to take your nausea medication as directed.  BELOW ARE SYMPTOMS THAT SHOULD BE REPORTED IMMEDIATELY: *FEVER GREATER THAN 100.4 F (38 C) OR HIGHER *CHILLS OR SWEATING *NAUSEA AND VOMITING THAT IS NOT CONTROLLED WITH YOUR NAUSEA MEDICATION *UNUSUAL SHORTNESS OF BREATH *UNUSUAL BRUISING OR BLEEDING *URINARY PROBLEMS (pain or burning when urinating, or frequent urination) *BOWEL PROBLEMS (unusual diarrhea, constipation, pain near the anus) TENDERNESS IN MOUTH AND THROAT WITH OR WITHOUT PRESENCE OF ULCERS (sore throat, sores in mouth, or a toothache) UNUSUAL RASH, SWELLING OR PAIN  UNUSUAL VAGINAL DISCHARGE OR ITCHING   Items with * indicate a potential emergency and should be followed up as soon as possible or go to the Emergency Department if any problems should occur.  Please show the CHEMOTHERAPY ALERT CARD or IMMUNOTHERAPY  ALERT CARD at check-in to the Emergency Department and triage nurse.  Should you have questions after your visit or need to cancel or reschedule your appointment, please contact [facility]  Dept: [phone]  and follow the prompts.  Office hours are 8:00 a.m. to 4:30 p.m. Monday - Friday. Please note that voicemails left after 4:00 p.m. may not be returned until the following business day.  We are closed weekends and major holidays. You have access to a nurse at all times for urgent questions. Please call the main number to the clinic Dept: [phone] and follow the prompts.   For any non-urgent questions, you may also contact your provider using MyChart. We now offer e-Visits for anyone 61 and older to request care online for non-urgent symptoms. For details visit [email or web address].   Also download the MyChart app! Go to the app store, search "MyChart", open the app, select [facility], and log in with your MyChart username and password.  Due to Covid, a mask is required upon entering the hospital/clinic. If you do not have a mask, one will be given to you upon arrival. For doctor visits, patients may have 1 support person aged 62 or older with them. For treatment visits, patients cannot have anyone with them due to current Covid guidelines and our immunocompromised population.

## 2021-09-05 ENCOUNTER — Other Ambulatory Visit: Payer: Self-pay

## 2021-09-05 ENCOUNTER — Inpatient Hospital Stay: Payer: No Typology Code available for payment source

## 2021-09-05 VITALS — BP 113/67 | HR 75 | Temp 98.6°F | Resp 15

## 2021-09-05 DIAGNOSIS — Z5111 Encounter for antineoplastic chemotherapy: Secondary | ICD-10-CM | POA: Diagnosis not present

## 2021-09-05 DIAGNOSIS — C669 Malignant neoplasm of unspecified ureter: Secondary | ICD-10-CM

## 2021-09-05 DIAGNOSIS — Z95828 Presence of other vascular implants and grafts: Secondary | ICD-10-CM

## 2021-09-05 DIAGNOSIS — C801 Malignant (primary) neoplasm, unspecified: Secondary | ICD-10-CM

## 2021-09-05 MED ORDER — PEGFILGRASTIM-JMDB 6 MG/0.6ML ~~LOC~~ SOSY
6.0000 mg | PREFILLED_SYRINGE | Freq: Once | SUBCUTANEOUS | Status: AC
  Administered 2021-09-05: 6 mg via SUBCUTANEOUS
  Filled 2021-09-05: qty 0.6

## 2021-09-05 MED ORDER — HEPARIN SOD (PORK) LOCK FLUSH 100 UNIT/ML IV SOLN: 500.0000 [IU] | Freq: Once | INTRAVENOUS | Status: DC

## 2021-09-05 MED ORDER — SODIUM CHLORIDE 0.9% FLUSH: 10.0000 mL | Freq: Once | INTRAVENOUS | Status: DC

## 2021-09-09 ENCOUNTER — Emergency Department (INDEPENDENT_AMBULATORY_CARE_PROVIDER_SITE_OTHER)
Admission: EM | Admit: 2021-09-09 | Discharge: 2021-09-09 | Disposition: A | Discharge status: Home / Self Care | Payer: No Typology Code available for payment source | Source: Home / Self Care | Attending: Family Medicine | Admitting: Family Medicine

## 2021-09-09 ENCOUNTER — Telehealth: Payer: Self-pay

## 2021-09-09 DIAGNOSIS — T50905A Adverse effect of unspecified drugs, medicaments and biological substances, initial encounter: Secondary | ICD-10-CM

## 2021-09-09 DIAGNOSIS — N390 Urinary tract infection, site not specified: Secondary | ICD-10-CM

## 2021-09-09 LAB — POCT URINALYSIS DIP (MANUAL ENTRY)
Bilirubin, UA: NEGATIVE
Glucose, UA: NEGATIVE mg/dL
Ketones, POC UA: NEGATIVE mg/dL
Nitrite, UA: POSITIVE — AB
Protein Ur, POC: 100 mg/dL — AB
Spec Grav, UA: 1.02 (ref 1.010–1.025)
Urobilinogen, UA: 1 E.U./dL
pH, UA: 7 (ref 5.0–8.0)

## 2021-09-09 MED ORDER — CIPROFLOXACIN HCL 500 MG PO TABS: 500.0000 mg | ORAL_TABLET | Freq: Two times a day (BID) | ORAL | 0 refills | Status: DC

## 2021-09-09 MED ORDER — TRAMADOL HCL 50 MG PO TABS: 50.0000 mg | ORAL_TABLET | Freq: Four times a day (QID) | ORAL | 0 refills | Status: DC | PRN

## 2021-09-09 NOTE — ED Triage Notes (Signed)
Pt presents with c/o dizziness, headaches, urinary frequency since 0200. Pt states she had the Flu and COVID vaccines yesterday at 1400. Pt states she had Chemo last Wednesday. Pt states she has stents placed.

## 2021-09-09 NOTE — ED Provider Notes (Signed)
[name]    CSN: [id number] Arrival date & time: 09/09/21  1905      History   Chief Complaint Chief Complaint  Patient presents with   Dizziness   Headache   Urinary Frequency    HPI Mary Stark is a 65 y.o. female.   HPI  Patient is to problems.  1 is dizziness.  She has fatigue and dizziness and a headache since she had her COVID and influenza vaccinations yesterday.  She feels a spinning sensation when she moves her head.  No fever or chills.  No nausea vomiting.  No rash She also states she has dysuria and frequency.  Urinalysis is positive for infection.  Past Medical History:  Diagnosis Date   Adenocarcinoma of bladder, stage 4 ([place]) 10/2020   oncologist--- dr Alen Blew and urologist-- dr pace;  w/ mets , started chemo 12/ 2021 and bilateral ureteral stents to treat hydronephrosis   Alcohol dependence ([place])    Anemia due to chemotherapy    Anxiety    Arthritis    Back and lt knee   CKD (chronic kidney disease), stage III ([place])    Family history of adverse reaction to anesthesia    father had a "tight" airway   GERD (gastroesophageal reflux disease)    History of 2019 novel coronavirus disease (COVID-19) 08/13/2020   positive result in epic 08-19-2020,  hospital admission in epic,  covid pneumonia with hypoxia respiratory failure, discharged with oxygen for 6 weeks   History of chemotherapy last done 05-13-2021   History of colitis 03/2018   acute colitis with sepsis   History of hepatitis C    TX FOR 2012   History of kidney stones    History of rheumatic fever as a child    per pt no valvular issues   History of sepsis 10/2020   hospital admission in epic due to pyelonephritis due to hydronephrosis, resolved   History of skin cancer    excision from nose   Hyperlipidemia    Hypertension    MDD (major depressive disorder)    Seasonal allergies    Vitamin D deficiency     Patient Active Problem List   Diagnosis Date Noted   Encounter  for antineoplastic chemotherapy 07/09/2021   UTI (urinary tract infection) 05/23/2021   Right lower quadrant pain 01/01/2021   Incontinence of feces 01/01/2021   Abnormal findings on diagnostic imaging of other abdominal regions, including retroperitoneum 01/01/2021   Change in bowel habit 01/01/2021   Hemorrhoids without complication 78/67/6720   Impingement syndrome, shoulder, left 11/27/2020   Port-A-Cath in place 11/22/2020   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   Malignancy ([place]) 10/31/2020   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   Sepsis secondary to UTI ([place]) 10/22/2020   Normocytic anemia 10/22/2020   Hyperproteinemia 10/22/2020   Hydronephrosis 10/01/2020   Proteinuria 10/01/2020   Abnormal CT of the abdomen 10/01/2020   Bilateral lower extremity edema 09/12/2020   Acute hypoxemic respiratory failure due to COVID-19 ([place]) 08/19/2020   Tear of medial meniscus of left knee, current 07/13/2019   Internal derangement of knee involving posterior horn of lateral meniscus, left 07/13/2019   Primary osteoarthritis of both knees 07/13/2019   Renal insufficiency 07/13/2019   Anxiety about health 03/22/2019   Hypokalemia 03/21/2019   Essential hypertension 02/13/2019   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/2018   Anemia due to blood loss, acute 04/14/2018   Hypomagnesemia 04/14/2018   Hypocalcemia 04/14/2018   Tobacco abuse 04/08/2018  Depression 04/07/2018   AKI (acute kidney injury) ([place]) 04/07/2018   Sepsis ([place]) 04/07/2018   Acute colitis 04/07/2018   Avascular necrosis of humeral head, right ([place]) 03/30/2018   NSAID long-term use 03/30/2018   Goals of care, counseling/discussion 03/30/2018   Seborrheic keratoses 03/15/2018   History of nonmelanoma skin cancer 03/02/2018   Skin lesion of chest wall 03/02/2018   Alcohol dependence with unspecified alcohol-induced disorder ([place]) 02/11/2018   Severe episode of recurrent major depressive disorder, without psychotic features ([place]) 02/11/2018    Transaminitis 01/06/2018   Nicotine dependence 01/06/2018   Heavy alcohol consumption 01/06/2018   Encounter for monitoring statin therapy 01/06/2018   Class 1 obesity due to excess calories with serious comorbidity in adult 01/06/2018   Chronic pain syndrome 04/21/2017   Chronic right shoulder pain 07/03/2015   Vaginismus 04/12/2014   Menopausal state 11/08/2013   Family history of ovarian cancer 09/27/2013   Postmenopausal atrophic vaginitis 09/12/2013   Personal history of colonic polyps 07/18/2013   Dyslipidemia, goal LDL below 100 07/17/2013   Depression with anxiety 07/17/2013   Hx of hepatitis C 07/17/2013   Vitamin D deficiency 07/17/2013   Right lumbar radiculopathy 07/17/2013   History of alcohol dependence ([place]) 07/17/2013   H/O: rheumatic fever 06/30/2012   Insomnia 06/30/2012   Personal history of other infectious and parasitic diseases 06/30/2012    Past Surgical History:  Procedure Laterality Date   ANAL RECTAL MANOMETRY N/A 01/31/2020   Procedure: ANO RECTAL MANOMETRY;  Surgeon: Arta Silence, MD;  Location: [facility];  Service: Endoscopy;  Laterality: N/A;   CESAREAN SECTION  1984   CYSTOSCOPY W/ URETERAL STENT PLACEMENT Bilateral 01/21/2021   Procedure: CYSTOSCOPY WITH STENT REPLACEMENT;  Surgeon: Robley Fries, MD;  [facility];  Service: Urology;  Laterality: Bilateral;  1 HR   CYSTOSCOPY W/ URETERAL STENT PLACEMENT Bilateral 05/20/2021   Procedure: CYSTOSCOPY WITH RETROGRADE PYELOGRAM/URETERAL STENT EXCHANGE;  Surgeon: Robley Fries, MD;  Location: [facility];  Service: Urology;  Laterality: Bilateral;   CYSTOSCOPY WITH RETROGRADE PYELOGRAM, URETEROSCOPY AND STENT PLACEMENT Bilateral 06/11/2020   Procedure: CYSTOSCOPY WITH RETROGRADE PYELOGRAM, URETEROSCOPY AND STENT PLACEMENT, RIGHT URETERAL BIOPSY;  Surgeon: Robley Fries, MD;  Location: [facility];  Service: Urology;  Laterality: Bilateral;  65 MINS   CYSTOSCOPY  WITH RETROGRADE PYELOGRAM, URETEROSCOPY AND STENT PLACEMENT Bilateral 07/05/2020   Procedure: CYSTOSCOPY WITH RETROGRADE PYELOGRAM, URETEROSCOPY,  BIOPSIES AND STENT EXCHANGES;  Surgeon: Robley Fries, MD;  Location: [facility];  Service: Urology;  Laterality: Bilateral;   FINGER SURGERY Right    5th   GYNECOLOGIC CRYOSURGERY  YRS AGO   IR IMAGING GUIDED PORT INSERTION  11/18/2020   TONSILLECTOMY  AS CHILD   TRANSURETHRAL RESECTION OF BLADDER TUMOR  01/21/2021   Procedure: TRANSURETHRAL RESECTION OF BLADDER TUMOR (TURBT);  Surgeon: Robley Fries, MD;  Location: [facility];  Service: Urology;;    OB History   No obstetric history on file.      Home Medications    Prior to Admission medications   Medication Sig Start Date End Date Taking? Authorizing Provider  acetaminophen (TYLENOL) 500 MG tablet Take 1,000 mg by mouth every 8 (eight) hours as needed for moderate pain.     [provider]  albuterol (PROAIR HFA) 108 (90 Base) MCG/ACT inhaler INHALE 1 TO 2 PUFFS BY MOUTH INTO THE LUNGS EVERY 4 HOURS AS NEEDED FOR WHEEZING OR SHORTNESS OF BREATH 08/28/20  [name], DO  amLODipine (NORVASC) 5 MG tablet Take 5 mg by mouth daily.    [provider]  ARIPiprazole (ABILIFY) 10 MG tablet TAKE 1 TABLET BY MOUTH ONCE DAILY 06/05/21 06/05/22  [name], DO  ARIPiprazole 10 MG TABS Take 10 mg by mouth daily. 11/28/20   [name], DO  ascorbic acid (VITAMIN C) 500 MG tablet Take 1,000 mg by mouth every evening.     [provider]  buPROPion (WELLBUTRIN XL) 300 MG 24 hr tablet TAKE 1 TABLET BY MOUTH EVERY DAY 08/26/21 08/26/22  [name], DO  cephALEXin (KEFLEX) 250 MG capsule TAKE ONE CAPSULE BY MOUTH NIGHTLY 06/06/21     Cholecalciferol (VITAMIN D) 125 MCG (5000 UT) CAPS Take 5,000 Units by mouth every evening.     [provider]  ciprofloxacin (CIPRO) 500 MG tablet Take 1 tablet (500 mg total)  by mouth 2 (two) times daily. 09/09/21  Yes Raylene Everts, MD  clonazePAM (KLONOPIN) 0.5 MG tablet TAKE 1 TABLET BY MOUTH AT BEDTIME 08/05/21 02/01/22  [name], DO  Coenzyme Q10 (CO Q-10) 200 MG CAPS Take 200 mg by mouth every evening.     [provider]  fluticasone-salmeterol (ADVAIR HFA) 115-21 MCG/ACT inhaler Inhale 2 puffs into the lungs 2 (two) times daily. 08/13/21   [name], DO  gabapentin (NEURONTIN) 300 MG capsule TAKE 1 CAPSULE BY MOUTH IN THE MORNING, 1 CAPSULE IN THE AFTERNOON & 4 CAPSULES AT NIGHT 05/07/21 05/07/22  [name], DO  GLUCOSAMINE-CHONDROITIN PO Take 2 tablets by mouth every evening.     [provider]  lidocaine-prilocaine (EMLA) cream Apply 1 application topically as needed. 11/06/20   Wyatt Portela, MD  megestrol (MEGACE) 40 MG tablet Take 1 tablet (40 mg total) by mouth 2 (two) times daily. 06/04/21   [name], DO  Meth-Hyo-M Bl-Na Phos-Ph Sal (URIBEL) 118 MG CAPS Take one capsule by mouth 3 times daily 06/06/21     mirabegron ER (MYRBETRIQ) 25 MG TB24 tablet TAKE 1 TABLET BY MOUTH ONCE A DAY 04/21/21 04/21/22    Nebivolol HCl 20 MG TABS Take 0.5 tablets (10 mg total) by mouth daily. 09/03/21 09/03/22  [name], DO  Omega-3 Fatty Acids (FISH OIL) 1200 MG CAPS Take 1,200 mg by mouth every evening.     [provider]  omeprazole (PRILOSEC) 20 MG capsule Take 1 capsule (20 mg total) by mouth daily. 06/23/21 06/23/22  [name], DO  ondansetron (ZOFRAN) 4 MG tablet Take 1 tablet (4 mg total) by mouth every 8 (eight) hours as needed for nausea or vomiting. 04/30/21   Wyatt Portela, MD  oxybutynin (DITROPAN) 5 MG tablet TAKE 1 TABLET BY MOUTH EVERY 8 HOURS AS NEEDED 05/20/21 05/20/22    oxymetazoline (AFRIN) 0.05 % nasal spray Place 1 spray into both nostrils 2 (two) times daily as needed for congestion.    [provider]  Phenazopyridine HCl (AZO URINARY PAIN PO) Take 1 tablet by mouth 3  (three) times daily as needed (bladder spasms).    [provider]  simvastatin (ZOCOR) 20 MG tablet TAKE 1 TABLET (20 MG TOTAL) BY MOUTH DAILY. 07/08/21 07/08/22  [name], DO  tamsulosin (FLOMAX) 0.4 MG CAPS capsule TAKE 1 CAPSULE BY MOUTH AT BEDTIME 04/21/21 04/21/22    thiamine 100 MG tablet Take 1 tablet (100 mg total) by mouth daily. 10/26/20   Bonnielee Haff, MD  traMADol (ULTRAM) 50 MG tablet Take 1 tablet (50 mg total)  by mouth every 6 (six) hours as needed. 09/09/21  Yes Raylene Everts, MD  vitamin B-12 (CYANOCOBALAMIN) 1000 MCG tablet Take 2,000 mcg by mouth every evening.     [provider]  Vitamin E 180 MG (400 UNIT) CAPS Take 400 Units by mouth every evening.     [provider]    Family History Family History  Problem Relation Age of Onset   Ovarian cancer Mother    Cardiomyopathy Father    Valvular heart disease Father    Liver cancer Brother    Protein C deficiency Brother    Protein C deficiency Brother    Stroke Brother     Social History Social History   Tobacco Use   Smoking status: Former    Packs/day: 1.00    Years: 25.00    Pack years: 25.00    Types: Cigarettes    Quit date: 2012    Years since quitting: 10.7   Smokeless tobacco: Never  Vaping Use   Vaping Use: Every day   Substances: Nicotine  Substance Use Topics   Alcohol use: Yes    Alcohol/week: 24.0 - 36.0 standard drinks    Types: 24 - 36 Cans of beer per week    Comment: occ wine   Drug use: No     Allergies   Metformin and related, Penicillins, Metformin, and Penicillin g sodium   Review of Systems Review of Systems  See HPI Physical Exam Triage Vital Signs ED Triage Vitals  Enc Vitals Group     BP 09/09/21 1923 (!) 139/94     Pulse Rate 09/09/21 1923 96     Resp 09/09/21 1923 16     Temp 09/09/21 1923 98.6 F (37 C)     Temp Source 09/09/21 1923 Oral     SpO2 09/09/21 1923 95 %     Weight --      Height --      Head Circumference  --      Peak Flow --      Pain Score 09/09/21 1921 6     Pain Loc --      Pain Edu? --      Excl. in [place]? --    No data found.  Updated Vital Signs BP (!) 139/94   Pulse 96   Temp 98.6 F (37 C) (Oral)   Resp 16   SpO2 95%      Physical Exam Constitutional:      General: She is not in acute distress.    Appearance: Normal appearance. She is well-developed.  HENT:     Head: Normocephalic and atraumatic.     Right Ear: Tympanic membrane and ear canal normal.     Left Ear: Tympanic membrane and ear canal normal.     Nose: Nose normal.     Mouth/Throat:     Pharynx: No posterior oropharyngeal erythema.  Eyes:     Conjunctiva/sclera: Conjunctivae normal.     Pupils: Pupils are equal, round, and reactive to light.  Cardiovascular:     Rate and Rhythm: Normal rate and regular rhythm.     Heart sounds: Normal heart sounds.  Pulmonary:     Effort: Pulmonary effort is normal. No respiratory distress.     Breath sounds: No wheezing or rales.  Abdominal:     General: There is no distension.     Palpations: Abdomen is soft.     Tenderness: There is no right CVA tenderness or left CVA  tenderness.  Musculoskeletal:        General: Normal range of motion.     Cervical back: Normal range of motion.  Lymphadenopathy:     Cervical: No cervical adenopathy.  Skin:    General: Skin is warm and dry.  Neurological:     General: No focal deficit present.     Mental Status: She is alert.     Gait: Gait normal.  Psychiatric:        Mood and Affect: Mood normal.        Behavior: Behavior normal.     UC Treatments / Results  Labs (all labs ordered are listed, but only abnormal results are displayed) Labs Reviewed  POCT URINALYSIS DIP (MANUAL ENTRY) - Abnormal; Notable for the following components:      Result Value   Color, UA other (*)    Blood, UA moderate (*)    Protein Ur, POC =100 (*)    Nitrite, UA Positive (*)    Leukocytes, UA Small (1+) (*)    All other components  within normal limits    EKG   Radiology No results found.  Procedures Procedures (including critical care time)  Medications Ordered in UC Medications - No data to display  Initial Impression / Assessment and Plan / UC Course  I have reviewed the triage vital signs and the nursing notes.  Pertinent labs & imaging results that were available during my care of the patient were reviewed by me and considered in my medical decision making (see chart for details).     I told her there was nothing acute today for the shot reaction.  She needs to hydrate, use over-the-counter medicines, and rest.  She states the Tylenol is not working for her headache.  I gave her a few tramadol to try. She also has a urinary tract infection.  She has used Cipro successfully for many times before.  We will prescribe Cipro for her at this time.  She is allergic to penicillins. Final Clinical Impressions(s) / UC Diagnoses   Final diagnoses:  Lower urinary tract infectious disease  Reaction to shot, initial encounter     Discharge Instructions       Take the cipro two times a day for a week Take pyridium as needed Push fluids Take tramadol as needed pain  Check my chart for urine test result   ED Prescriptions     Medication Sig Dispense Auth. Provider   ciprofloxacin (CIPRO) 500 MG tablet Take 1 tablet (500 mg total) by mouth 2 (two) times daily. 14 tablet Raylene Everts, MD   traMADol (ULTRAM) 50 MG tablet Take 1 tablet (50 mg total) by mouth every 6 (six) hours as needed. 15 tablet Raylene Everts, MD      I have reviewed the PDMP during this encounter.   Raylene Everts, MD 09/09/21 [phone]

## 2021-09-09 NOTE — Telephone Encounter (Signed)
Notified Patient of completion of Attending Physician's Statement for LTD. Form forwarded to [employer] with signed Release of Information Form and request for records.

## 2021-09-09 NOTE — Discharge Instructions (Addendum)
  Take the cipro two times a day for a week Take pyridium as needed Push fluids Take tramadol as needed pain  Check my chart for urine test result

## 2021-09-15 ENCOUNTER — Ambulatory Visit (INDEPENDENT_AMBULATORY_CARE_PROVIDER_SITE_OTHER): Payer: No Typology Code available for payment source | Admitting: Medical-Surgical

## 2021-09-15 ENCOUNTER — Encounter: Payer: Self-pay | Admitting: Medical-Surgical

## 2021-09-15 ENCOUNTER — Other Ambulatory Visit (HOSPITAL_COMMUNITY): Payer: Self-pay

## 2021-09-15 ENCOUNTER — Other Ambulatory Visit: Payer: Self-pay

## 2021-09-15 VITALS — BP 151/94 | HR 77 | Resp 20 | Ht 66.0 in | Wt 176.0 lb

## 2021-09-15 DIAGNOSIS — Z7689 Persons encountering health services in other specified circumstances: Secondary | ICD-10-CM

## 2021-09-15 DIAGNOSIS — F418 Other specified anxiety disorders: Secondary | ICD-10-CM | POA: Diagnosis not present

## 2021-09-15 DIAGNOSIS — M25561 Pain in right knee: Secondary | ICD-10-CM

## 2021-09-15 DIAGNOSIS — I10 Essential (primary) hypertension: Secondary | ICD-10-CM | POA: Diagnosis not present

## 2021-09-15 DIAGNOSIS — G8929 Other chronic pain: Secondary | ICD-10-CM | POA: Insufficient documentation

## 2021-09-15 DIAGNOSIS — Z23 Encounter for immunization: Secondary | ICD-10-CM | POA: Diagnosis not present

## 2021-09-15 MED ORDER — MIRABEGRON ER 25 MG PO TB24
ORAL_TABLET | Freq: Every day | ORAL | 3 refills | Status: DC
  Filled 2021-09-15: qty 30, 30d supply, fill #0
  Filled 2021-10-16: qty 30, 30d supply, fill #1
  Filled 2021-11-18: qty 30, 30d supply, fill #2
  Filled 2021-12-29: qty 30, 30d supply, fill #3

## 2021-09-15 MED ORDER — TRAMADOL HCL 50 MG PO TABS
50.0000 mg | ORAL_TABLET | Freq: Four times a day (QID) | ORAL | 0 refills | Status: DC | PRN
  Filled 2021-09-15: qty 15, 4d supply, fill #0

## 2021-09-15 MED ORDER — TAMSULOSIN HCL 0.4 MG PO CAPS
ORAL_CAPSULE | Freq: Every day | ORAL | 3 refills | Status: DC
  Filled 2021-09-15: qty 30, 30d supply, fill #0
  Filled 2021-10-16: qty 30, 30d supply, fill #1
  Filled 2021-11-18: qty 30, 30d supply, fill #2
  Filled 2021-12-17: qty 30, 30d supply, fill #3

## 2021-09-15 MED ORDER — NEBIVOLOL HCL 20 MG PO TABS
10.0000 mg | ORAL_TABLET | Freq: Every day | ORAL | 1 refills | Status: DC
  Filled 2021-09-15: qty 45, 90d supply, fill #0
  Filled 2021-12-17: qty 45, 90d supply, fill #1
  Filled 2022-03-19: qty 45, 90d supply, fill #2
  Filled 2022-07-23: qty 45, 90d supply, fill #3

## 2021-09-15 NOTE — Progress Notes (Signed)
  HPI with pertinent ROS:   CC: Establish care, right knee pain, hypertension  HPI: Pleasant 65 year old female presenting today to establish care with a new provider as her PCP has left the practice.  Hypertension-checks her blood pressure regularly at home with readings of 130s/70s-80s.  She is on chemotherapy and receives steroids which causes her blood pressure to rise.  She does have amlodipine 5 mg that she takes on an as-needed basis to help keep her blood pressure in check.  Taking nebivolol 10 mg daily and would like to have a refill.  Tolerating her medications well without side effects. Denies CP, SOB, palpitations, lower extremity edema, dizziness, headaches, or vision changes.  Right knee pain-she has seen Dr. Darene Lamer for this before and this is a well-known chronic issue.  Has been having some difficulty with it lately and would like to have tramadol on hand to use sparingly as needed.  Notes that she has only used it about 3 times in the last week or so.  Mood-she is on several psych medications and wants to make sure that those prescriptions will be continued with transition of care.  Uses clonazepam 0.5 mg nightly and has been on this for years.  When she does not take the medication, she wakes with intense fear and panic that there may be something bad that will happen.  Admits that she is afraid of dying and with her current chemotherapy, she has quite a bit of stress over this.  Denies SI/HI.  I reviewed the past medical history, family history, social history, surgical history, and allergies today and no changes were needed.  Please see the problem list section below in epic for further details.   Physical exam:   General: Well Developed, well nourished, and in no acute distress.  Neuro: Alert and oriented x3.  HEENT: Normocephalic, atraumatic.  Skin: Warm and dry. Cardiac: Regular rate and rhythm, no murmurs rubs or gallops, no lower extremity edema.  Respiratory: Clear to  auscultation bilaterally. Not using accessory muscles, speaking in full sentences.  Impression and Recommendations:    1. Encounter to establish care Reviewed available information and discussed care concerns with patient.   2. Essential hypertension with goal blood pressure less than 130/80 Blood pressure at home well controlled although it is a bit elevated today.  Continue nebivolol 10 mg daily.  Continue amlodipine 5 mg daily as needed when receiving steroids with chemo. - Nebivolol HCl 20 MG TABS; Take 0.5 tablets (10 mg total) by mouth daily.  Dispense: 90 tablet; Refill: 1  3. Anxiety about health Discussed her current mental health regimen.  For now plan to continue Abilify, Wellbutrin, gabapentin, and clonazepam as prescribed.  We will not make any changes at this time.  Advised patient that I will manage clonazepam for her but we will not increase her dose or her frequency.  Patient verbalized understanding and is agreeable to the plan.  4. Chronic pain of right knee Discussed sparing use of tramadol for pain.  Sending in a new prescription.  For further refills or management, recommend she be evaluated by Dr. Darene Lamer  5. Need for pneumococcal vaccination Pneumonia vaccine given in office today. - Pneumococcal conjugate vaccine 20-valent (Prevnar-20)  Return in about 3 months (around 12/16/2021) for chronic disease follow up. ___________________________________________ [name], DNP, APRN, FNP-BC [facility] and [place]

## 2021-09-16 ENCOUNTER — Other Ambulatory Visit: Payer: Self-pay | Admitting: *Deleted

## 2021-09-16 MED FILL — Dexamethasone Sodium Phosphate Inj 100 MG/10ML: INTRAMUSCULAR | Qty: 1 | Status: AC

## 2021-09-17 ENCOUNTER — Inpatient Hospital Stay: Payer: No Typology Code available for payment source | Attending: Oncology

## 2021-09-17 ENCOUNTER — Inpatient Hospital Stay (HOSPITAL_BASED_OUTPATIENT_CLINIC_OR_DEPARTMENT_OTHER): Payer: No Typology Code available for payment source | Admitting: Oncology

## 2021-09-17 ENCOUNTER — Inpatient Hospital Stay: Payer: No Typology Code available for payment source

## 2021-09-17 ENCOUNTER — Other Ambulatory Visit: Payer: Self-pay

## 2021-09-17 VITALS — BP 156/83 | HR 66 | Temp 97.9°F | Resp 18 | Ht 66.0 in | Wt 178.3 lb

## 2021-09-17 DIAGNOSIS — D630 Anemia in neoplastic disease: Secondary | ICD-10-CM | POA: Insufficient documentation

## 2021-09-17 DIAGNOSIS — C669 Malignant neoplasm of unspecified ureter: Secondary | ICD-10-CM | POA: Diagnosis present

## 2021-09-17 DIAGNOSIS — D61810 Antineoplastic chemotherapy induced pancytopenia: Secondary | ICD-10-CM | POA: Diagnosis not present

## 2021-09-17 DIAGNOSIS — T451X5D Adverse effect of antineoplastic and immunosuppressive drugs, subsequent encounter: Secondary | ICD-10-CM | POA: Diagnosis not present

## 2021-09-17 DIAGNOSIS — Z95828 Presence of other vascular implants and grafts: Secondary | ICD-10-CM

## 2021-09-17 DIAGNOSIS — Z5189 Encounter for other specified aftercare: Secondary | ICD-10-CM | POA: Diagnosis not present

## 2021-09-17 DIAGNOSIS — Z5111 Encounter for antineoplastic chemotherapy: Secondary | ICD-10-CM | POA: Diagnosis not present

## 2021-09-17 DIAGNOSIS — C801 Malignant (primary) neoplasm, unspecified: Secondary | ICD-10-CM

## 2021-09-17 DIAGNOSIS — D701 Agranulocytosis secondary to cancer chemotherapy: Secondary | ICD-10-CM | POA: Diagnosis not present

## 2021-09-17 LAB — CMP (CANCER CENTER ONLY)
ALT: 9 U/L (ref 0–44)
AST: 18 U/L (ref 15–41)
Albumin: 3.5 g/dL (ref 3.5–5.0)
Alkaline Phosphatase: 111 U/L (ref 38–126)
Anion gap: 11 (ref 5–15)
BUN: 10 mg/dL (ref 8–23)
CO2: 22 mmol/L (ref 22–32)
Calcium: 9 mg/dL (ref 8.9–10.3)
Chloride: 107 mmol/L (ref 98–111)
Creatinine: 1.11 mg/dL — ABNORMAL HIGH (ref 0.44–1.00)
GFR, Estimated: 56 mL/min — ABNORMAL LOW (ref >60)
Glucose, Bld: 96 mg/dL (ref 70–99)
Potassium: 4 mmol/L (ref 3.5–5.1)
Sodium: 140 mmol/L (ref 135–145)
Total Bilirubin: 0.4 mg/dL (ref 0.3–1.2)
Total Protein: 6.9 g/dL (ref 6.5–8.1)

## 2021-09-17 LAB — SAMPLE TO BLOOD BANK

## 2021-09-17 LAB — CBC WITH DIFFERENTIAL (CANCER CENTER ONLY)
Abs Immature Granulocytes: 0.17 10*3/uL — ABNORMAL HIGH (ref 0.00–0.07)
Basophils Absolute: 0 10*3/uL (ref 0.0–0.1)
Basophils Relative: 1 %
Eosinophils Absolute: 0.3 10*3/uL (ref 0.0–0.5)
Eosinophils Relative: 5 %
HCT: 29.7 % — ABNORMAL LOW (ref 36.0–46.0)
Hemoglobin: 9.7 g/dL — ABNORMAL LOW (ref 12.0–15.0)
Immature Granulocytes: 2 %
Lymphocytes Relative: 24 %
Lymphs Abs: 1.7 10*3/uL (ref 0.7–4.0)
MCH: 33.7 pg (ref 26.0–34.0)
MCHC: 32.7 g/dL (ref 30.0–36.0)
MCV: 103.1 fL — ABNORMAL HIGH (ref 80.0–100.0)
Monocytes Absolute: 0.8 10*3/uL (ref 0.1–1.0)
Monocytes Relative: 11 %
Neutro Abs: 4 10*3/uL (ref 1.7–7.7)
Neutrophils Relative %: 57 %
Platelet Count: 88 10*3/uL — ABNORMAL LOW (ref 150–400)
RBC: 2.88 MIL/uL — ABNORMAL LOW (ref 3.87–5.11)
RDW: 21.8 % — ABNORMAL HIGH (ref 11.5–15.5)
WBC Count: 7 10*3/uL (ref 4.0–10.5)
nRBC: 0 % (ref 0.0–0.2)

## 2021-09-17 MED ORDER — LEUCOVORIN CALCIUM INJECTION 350 MG
400.0000 mg/m2 | Freq: Once | INTRAVENOUS | Status: AC
  Administered 2021-09-17: 804 mg via INTRAVENOUS
  Filled 2021-09-17: qty 40.2

## 2021-09-17 MED ORDER — OXALIPLATIN CHEMO INJECTION 100 MG/20ML
64.0000 mg/m2 | Freq: Once | INTRAVENOUS | Status: AC
  Administered 2021-09-17: 130 mg via INTRAVENOUS
  Filled 2021-09-17: qty 20

## 2021-09-17 MED ORDER — DEXTROSE 5 % IV SOLN: Freq: Once | INTRAVENOUS | Status: AC

## 2021-09-17 MED ORDER — PALONOSETRON HCL INJECTION 0.25 MG/5ML
0.2500 mg | Freq: Once | INTRAVENOUS | Status: AC
Start: 2021-09-17 — End: 2021-09-17
  Administered 2021-09-17: 0.25 mg via INTRAVENOUS
  Filled 2021-09-17: qty 5

## 2021-09-17 MED ORDER — FLUOROURACIL CHEMO INJECTION 2.5 GM/50ML
400.0000 mg/m2 | Freq: Once | INTRAVENOUS | Status: AC
  Administered 2021-09-17: 800 mg via INTRAVENOUS
  Filled 2021-09-17: qty 16

## 2021-09-17 MED ORDER — SODIUM CHLORIDE 0.9 % IV SOLN
2400.0000 mg/m2 | INTRAVENOUS | Status: DC
  Administered 2021-09-17: 4800 mg via INTRAVENOUS
  Filled 2021-09-17: qty 96

## 2021-09-17 MED ORDER — SODIUM CHLORIDE 0.9% FLUSH
10.0000 mL | Freq: Once | INTRAVENOUS | Status: AC
  Administered 2021-09-17: 10 mL

## 2021-09-17 MED ORDER — SODIUM CHLORIDE 0.9 % IV SOLN
10.0000 mg | Freq: Once | INTRAVENOUS | Status: AC
  Administered 2021-09-17: 10 mg via INTRAVENOUS
  Filled 2021-09-17: qty 10

## 2021-09-17 NOTE — Progress Notes (Signed)
Denies anyHematology and Oncology Follow Up Visit  ROREY HODGES [id number] September 02, 1956 65 y.o. 09/17/2021 8:37 AM [name], DOAlexander, [name], DO   [name]: 65 year old woman with stage IV adenocarcinoma of the bladder presented with pelvic adenopathy in 2021.     Prior Therapy:  She is status post retroperitoneal lymph node biopsy completed on 10/24/2020 which showed metastatic carcinoma.  She is status post repeat cystoscopy and bilateral ureteral stent exchange as well as resection of a bladder tumor on January 21, 2021.  A final pathology showed an adenocarcinoma.  Chemotherapy utilizing carboplatin and gemcitabine started on November 22, 2020.  She completed 6 cycles of therapy on March 06, 2021.  Current therapy: FOLFOX chemotherapy started on Apr 15, 2021.  She is here for cycle 10 of therapy.   Interim History: Ms. Costin returns today for repeat evaluation.  Since the last visit, she reports no major changes in her health.  She denies any nausea, vomiting or abdominal pain.  She denies any worsening neuropathy or cold sensitivity.  She continues to tolerate chemotherapy without any other complaints.  She denies any abdominal pain weight loss.  Her performance status quality of life remains unchanged.     Medications: Updated on review. Current Outpatient Medications  Medication Sig Dispense Refill   acetaminophen (TYLENOL) 500 MG tablet Take 1,000 mg by mouth every 8 (eight) hours as needed for moderate pain.      albuterol (PROAIR HFA) 108 (90 Base) MCG/ACT inhaler INHALE 1 TO 2 PUFFS BY MOUTH INTO THE LUNGS EVERY 4 HOURS AS NEEDED FOR WHEEZING OR SHORTNESS OF BREATH 8.5 g 1   amLODipine (NORVASC) 5 MG tablet Take 5 mg by mouth daily.     ARIPiprazole (ABILIFY) 10 MG tablet TAKE 1 TABLET BY MOUTH ONCE DAILY 90 tablet 0   ARIPiprazole 10 MG TABS Take 10 mg by mouth daily. 90 tablet 1   ascorbic acid (VITAMIN C) 500 MG tablet Take 1,000 mg by mouth  every evening.      buPROPion (WELLBUTRIN XL) 300 MG 24 hr tablet TAKE 1 TABLET BY MOUTH EVERY DAY 30 tablet 3   cephALEXin (KEFLEX) 250 MG capsule TAKE ONE CAPSULE BY MOUTH NIGHTLY 90 capsule 1   Cholecalciferol (VITAMIN D) 125 MCG (5000 UT) CAPS Take 5,000 Units by mouth every evening.      ciprofloxacin (CIPRO) 500 MG tablet Take 1 tablet (500 mg total) by mouth 2 (two) times daily. 14 tablet 0   clonazePAM (KLONOPIN) 0.5 MG tablet TAKE 1 TABLET BY MOUTH AT BEDTIME 30 tablet 0   Coenzyme Q10 (CO Q-10) 200 MG CAPS Take 200 mg by mouth every evening.      fluticasone-salmeterol (ADVAIR HFA) 115-21 MCG/ACT inhaler Inhale 2 puffs into the lungs 2 (two) times daily. 1 each 0   gabapentin (NEURONTIN) 300 MG capsule TAKE 1 CAPSULE BY MOUTH IN THE MORNING, 1 CAPSULE IN THE AFTERNOON & 4 CAPSULES AT NIGHT 180 capsule 3   GLUCOSAMINE-CHONDROITIN PO Take 2 tablets by mouth every evening.      lidocaine-prilocaine (EMLA) cream Apply 1 application topically as needed. 30 g 0   megestrol (MEGACE) 40 MG tablet Take 1 tablet (40 mg total) by mouth 2 (two) times daily. 60 tablet 1   Meth-Hyo-M Bl-Na Phos-Ph Sal (URIBEL) 118 MG CAPS Take one capsule by mouth 3 times daily 30 capsule 3   mirabegron ER (MYRBETRIQ) 25 MG TB24 tablet TAKE 1 TABLET BY MOUTH ONCE A DAY 30 tablet 3  Nebivolol HCl 20 MG TABS Take 1/2 tablet (10 mg total) by mouth daily. 90 tablet 1   Omega-3 Fatty Acids (FISH OIL) 1200 MG CAPS Take 1,200 mg by mouth every evening.      omeprazole (PRILOSEC) 20 MG capsule Take 1 capsule (20 mg total) by mouth daily. 90 capsule 3   ondansetron (ZOFRAN) 4 MG tablet Take 1 tablet (4 mg total) by mouth every 8 (eight) hours as needed for nausea or vomiting. 40 tablet 3   oxybutynin (DITROPAN) 5 MG tablet TAKE 1 TABLET BY MOUTH EVERY 8 HOURS AS NEEDED 90 tablet 3   oxymetazoline (AFRIN) 0.05 % nasal spray Place 1 spray into both nostrils 2 (two) times daily as needed for congestion.     Phenazopyridine HCl  (AZO URINARY PAIN PO) Take 1 tablet by mouth 3 (three) times daily as needed (bladder spasms).     simvastatin (ZOCOR) 20 MG tablet TAKE 1 TABLET (20 MG TOTAL) BY MOUTH DAILY. 90 tablet 3   tamsulosin (FLOMAX) 0.4 MG CAPS capsule TAKE 1 CAPSULE BY MOUTH AT BEDTIME 30 capsule 3   thiamine 100 MG tablet Take 1 tablet (100 mg total) by mouth daily. 30 tablet 0   traMADol (ULTRAM) 50 MG tablet Take 1 tablet (50 mg total) by mouth every 6 (six) hours as needed. 15 tablet 0   vitamin B-12 (CYANOCOBALAMIN) 1000 MCG tablet Take 2,000 mcg by mouth every evening.      Vitamin E 180 MG (400 UNIT) CAPS Take 400 Units by mouth every evening.      No current facility-administered medications for this visit.     Allergies:  Allergies  Allergen Reactions   Metformin And Related Other (See Comments)    Lactic acidosis    Penicillins Hives and Rash    Reports hives to penicillin and amoxicillin as an adult "a long time ago." Has tolerated cefdinir (09/2020) and cefepime (10/2020) before.   Metformin     NDC Code:00093721201 NDC ZWCH:[id number]   Penicillin G Sodium     NDC TIRW:[id number] NDC PPJK:[id number] NDC DXIP:[id number]      Physical Exam:          Blood pressure (!) 156/83, pulse 66, temperature 97.9 F (36.6 C), temperature source Oral, resp. rate 18, height 5\' 6"  (1.676 m), weight 178 lb 4.8 oz (80.9 kg), SpO2 98 %.      ECOG: 1    General appearance: Comfortable appearing without any discomfort Head: Normocephalic without any trauma Oropharynx: Mucous membranes are moist and pink without any thrush or ulcers. Eyes: Pupils are equal and round reactive to light. Lymph nodes: No cervical, supraclavicular, inguinal or axillary lymphadenopathy.   Heart:regular rate and rhythm.  S1 and S2 without leg edema. Lung: Clear without any rhonchi or wheezes.  No dullness to percussion. Abdomin: Soft, nontender, nondistended with good bowel sounds.  No  hepatosplenomegaly. Musculoskeletal: No joint deformity or effusion.  Full range of motion noted. Neurological: No deficits noted on motor, sensory and deep tendon reflex exam. Skin: No petechial rash or dryness.  Appeared moist.                 Lab Results: Lab Results  Component Value Date   WBC 8.2 09/03/2021   HGB 10.9 (L) 09/03/2021   HCT 33.8 (L) 09/03/2021   MCV 101.2 (H) 09/03/2021   PLT 130 (L) 09/03/2021     Chemistry      Component Value Date/Time   NA 137 09/03/2021 1131  NA 142 04/26/2018 1333   K 4.6 09/03/2021 1131   CL 105 09/03/2021 1131   CO2 22 09/03/2021 1131   BUN 25 (H) 09/03/2021 1131   BUN 22 04/26/2018 1333   CREATININE 1.18 (H) 09/03/2021 1131   CREATININE 1.32 (H) 06/04/2021 0000   GLU 87 02/07/2013 0000      Component Value Date/Time   CALCIUM 9.2 09/03/2021 1131   ALKPHOS 98 09/03/2021 1131   AST 19 09/03/2021 1131   ALT 9 09/03/2021 1131   BILITOT 0.5 09/03/2021 1131      Impression and Plan:    65 year old woman with:   1.    Bladder cancer diagnosed in 2021.  She was found to have stage IV adenocarcinoma with pelvic adenopathy.  Her disease status was updated at this time and treatment choices were reviewed.  She continues to tolerate chemotherapy without any major complications.  Risks and benefits of continuing this treatment were reviewed.  Complications including nausea, vomiting, myelosuppression, neutropenia and possible sepsis were reiterated.  Worsening neuropathy and cold sensitivity remains the most problematic issue.  The plan is to complete 12 cycles of therapy if possible and switch to maintenance 5-FU potentially.  Laboratory data from today reviewed and she is agreeable to proceed.  2.  IV access: Port-A-Cath remains in use without any issues.   3.  Antiemetics: No nausea or vomiting reported.  Compazine and Zofran is available to her.   4.  Renal function surveillance: Creatinine clearance continues to  be around 50 cc/min.  We will continue to monitor moving forward.   5.  Goals of care: Therapy remains palliative although aggressive measures are warranted given her reasonable performance status.   6.  Anemia: Related to malignancy and chemotherapy.  She has received transfusions in the past.  Hemoglobin is adequate today and does not require transfusion.   7.  Neutropenia: Related to chemotherapy and she will require growth factor support after each cycle.  8.  Thrombocytopenia: Remains mild and asymptomatic.  Her thrombocytopenia is related to chemotherapy.  Her platelet count is lower today although adequate for chemotherapy.   9.  Follow-up: She will return in 2 weeks for the next cycle of therapy.   30  minutes were spent on this visit.  The time was dedicated to reviewing laboratory data, disease status update and outlining future plan of care.   Zola Button, MD 10/5/20228:37 AM

## 2021-09-18 ENCOUNTER — Other Ambulatory Visit (HOSPITAL_COMMUNITY): Payer: Self-pay

## 2021-09-18 ENCOUNTER — Other Ambulatory Visit: Payer: Self-pay | Admitting: Osteopathic Medicine

## 2021-09-18 MED ORDER — ARIPIPRAZOLE 10 MG PO TABS
ORAL_TABLET | Freq: Every day | ORAL | 0 refills | Status: DC
  Filled 2021-09-18: qty 90, 90d supply, fill #0

## 2021-09-19 ENCOUNTER — Other Ambulatory Visit: Payer: Self-pay

## 2021-09-19 ENCOUNTER — Inpatient Hospital Stay: Payer: No Typology Code available for payment source

## 2021-09-19 VITALS — BP 141/81 | HR 66 | Temp 98.0°F | Resp 18

## 2021-09-19 DIAGNOSIS — C669 Malignant neoplasm of unspecified ureter: Secondary | ICD-10-CM

## 2021-09-19 DIAGNOSIS — C801 Malignant (primary) neoplasm, unspecified: Secondary | ICD-10-CM

## 2021-09-19 DIAGNOSIS — Z5111 Encounter for antineoplastic chemotherapy: Secondary | ICD-10-CM | POA: Diagnosis not present

## 2021-09-19 MED ORDER — PEGFILGRASTIM-JMDB 6 MG/0.6ML ~~LOC~~ SOSY
6.0000 mg | PREFILLED_SYRINGE | Freq: Once | SUBCUTANEOUS | Status: AC
  Administered 2021-09-19: 6 mg via SUBCUTANEOUS
  Filled 2021-09-19: qty 0.6

## 2021-09-19 MED ORDER — HEPARIN SOD (PORK) LOCK FLUSH 100 UNIT/ML IV SOLN
500.0000 [IU] | Freq: Once | INTRAVENOUS | Status: AC | PRN
  Administered 2021-09-19: 500 [IU]

## 2021-09-19 MED ORDER — SODIUM CHLORIDE 0.9% FLUSH
10.0000 mL | INTRAVENOUS | Status: DC | PRN
  Administered 2021-09-19: 10 mL

## 2021-09-24 ENCOUNTER — Other Ambulatory Visit (HOSPITAL_COMMUNITY): Payer: Self-pay

## 2021-09-24 ENCOUNTER — Other Ambulatory Visit: Payer: Self-pay | Admitting: Osteopathic Medicine

## 2021-09-24 NOTE — Telephone Encounter (Signed)
Routing to covering provider.  °

## 2021-09-25 ENCOUNTER — Other Ambulatory Visit: Payer: Self-pay | Admitting: Osteopathic Medicine

## 2021-09-25 ENCOUNTER — Other Ambulatory Visit (HOSPITAL_COMMUNITY): Payer: Self-pay

## 2021-09-25 MED ORDER — GABAPENTIN 300 MG PO CAPS
ORAL_CAPSULE | ORAL | 3 refills | Status: DC
  Filled 2021-09-25: qty 180, 30d supply, fill #0
  Filled 2021-11-07: qty 180, 30d supply, fill #1
  Filled 2021-12-17: qty 180, 30d supply, fill #2
  Filled 2022-01-19: qty 180, 30d supply, fill #3

## 2021-09-26 ENCOUNTER — Other Ambulatory Visit (HOSPITAL_COMMUNITY): Payer: Self-pay

## 2021-09-26 MED ORDER — CLONAZEPAM 0.5 MG PO TABS
ORAL_TABLET | Freq: Every day | ORAL | 0 refills | Status: DC
  Filled 2021-09-26: qty 30, 30d supply, fill #0

## 2021-09-30 ENCOUNTER — Ambulatory Visit: Payer: No Typology Code available for payment source | Admitting: Sports Medicine

## 2021-09-30 MED FILL — Dexamethasone Sodium Phosphate Inj 100 MG/10ML: INTRAMUSCULAR | Qty: 1 | Status: AC

## 2021-10-01 ENCOUNTER — Ambulatory Visit: Payer: No Typology Code available for payment source | Admitting: Sports Medicine

## 2021-10-01 ENCOUNTER — Other Ambulatory Visit: Payer: Self-pay

## 2021-10-01 ENCOUNTER — Inpatient Hospital Stay: Payer: No Typology Code available for payment source

## 2021-10-01 ENCOUNTER — Inpatient Hospital Stay (HOSPITAL_BASED_OUTPATIENT_CLINIC_OR_DEPARTMENT_OTHER): Payer: No Typology Code available for payment source | Admitting: Oncology

## 2021-10-01 VITALS — BP 149/84 | HR 78 | Temp 98.2°F | Resp 18 | Wt 177.5 lb

## 2021-10-01 DIAGNOSIS — C669 Malignant neoplasm of unspecified ureter: Secondary | ICD-10-CM

## 2021-10-01 DIAGNOSIS — Z5111 Encounter for antineoplastic chemotherapy: Secondary | ICD-10-CM | POA: Diagnosis not present

## 2021-10-01 DIAGNOSIS — Z95828 Presence of other vascular implants and grafts: Secondary | ICD-10-CM

## 2021-10-01 DIAGNOSIS — C801 Malignant (primary) neoplasm, unspecified: Secondary | ICD-10-CM

## 2021-10-01 LAB — CMP (CANCER CENTER ONLY)
ALT: 10 U/L (ref 0–44)
AST: 21 U/L (ref 15–41)
Albumin: 3.7 g/dL (ref 3.5–5.0)
Alkaline Phosphatase: 125 U/L (ref 38–126)
Anion gap: 12 (ref 5–15)
BUN: 17 mg/dL (ref 8–23)
CO2: 20 mmol/L — ABNORMAL LOW (ref 22–32)
Calcium: 9 mg/dL (ref 8.9–10.3)
Chloride: 106 mmol/L (ref 98–111)
Creatinine: 1.1 mg/dL — ABNORMAL HIGH (ref 0.44–1.00)
GFR, Estimated: 56 mL/min — ABNORMAL LOW (ref >60)
Glucose, Bld: 95 mg/dL (ref 70–99)
Potassium: 4.7 mmol/L (ref 3.5–5.1)
Sodium: 138 mmol/L (ref 135–145)
Total Bilirubin: 0.3 mg/dL (ref 0.3–1.2)
Total Protein: 7.1 g/dL (ref 6.5–8.1)

## 2021-10-01 LAB — CBC WITH DIFFERENTIAL (CANCER CENTER ONLY)
Abs Immature Granulocytes: 0.36 10*3/uL — ABNORMAL HIGH (ref 0.00–0.07)
Basophils Absolute: 0 10*3/uL (ref 0.0–0.1)
Basophils Relative: 0 %
Eosinophils Absolute: 0.1 10*3/uL (ref 0.0–0.5)
Eosinophils Relative: 1 %
HCT: 27.8 % — ABNORMAL LOW (ref 36.0–46.0)
Hemoglobin: 9.2 g/dL — ABNORMAL LOW (ref 12.0–15.0)
Immature Granulocytes: 4 %
Lymphocytes Relative: 20 %
Lymphs Abs: 1.9 10*3/uL (ref 0.7–4.0)
MCH: 35.1 pg — ABNORMAL HIGH (ref 26.0–34.0)
MCHC: 33.1 g/dL (ref 30.0–36.0)
MCV: 106.1 fL — ABNORMAL HIGH (ref 80.0–100.0)
Monocytes Absolute: 1.2 10*3/uL — ABNORMAL HIGH (ref 0.1–1.0)
Monocytes Relative: 12 %
Neutro Abs: 6.3 10*3/uL (ref 1.7–7.7)
Neutrophils Relative %: 63 %
Platelet Count: 70 10*3/uL — ABNORMAL LOW (ref 150–400)
RBC: 2.62 MIL/uL — ABNORMAL LOW (ref 3.87–5.11)
RDW: 22.9 % — ABNORMAL HIGH (ref 11.5–15.5)
WBC Count: 9.8 10*3/uL (ref 4.0–10.5)
nRBC: 0 % (ref 0.0–0.2)

## 2021-10-01 LAB — SAMPLE TO BLOOD BANK

## 2021-10-01 MED ORDER — DEXTROSE 5 % IV SOLN: Freq: Once | INTRAVENOUS | Status: AC

## 2021-10-01 MED ORDER — OXALIPLATIN CHEMO INJECTION 100 MG/20ML
64.0000 mg/m2 | Freq: Once | INTRAVENOUS | Status: AC
  Administered 2021-10-01: 130 mg via INTRAVENOUS
  Filled 2021-10-01: qty 20

## 2021-10-01 MED ORDER — SODIUM CHLORIDE 0.9% FLUSH: 10.0000 mL | INTRAVENOUS | Status: DC | PRN

## 2021-10-01 MED ORDER — SODIUM CHLORIDE 0.9 % IV SOLN
2400.0000 mg/m2 | INTRAVENOUS | Status: DC
  Administered 2021-10-01: 4800 mg via INTRAVENOUS
  Filled 2021-10-01: qty 96

## 2021-10-01 MED ORDER — SODIUM CHLORIDE 0.9 % IV SOLN
10.0000 mg | Freq: Once | INTRAVENOUS | Status: AC
  Administered 2021-10-01: 10 mg via INTRAVENOUS
  Filled 2021-10-01: qty 10

## 2021-10-01 MED ORDER — FLUOROURACIL CHEMO INJECTION 2.5 GM/50ML
400.0000 mg/m2 | Freq: Once | INTRAVENOUS | Status: AC
  Administered 2021-10-01: 800 mg via INTRAVENOUS
  Filled 2021-10-01: qty 16

## 2021-10-01 MED ORDER — PALONOSETRON HCL INJECTION 0.25 MG/5ML
0.2500 mg | Freq: Once | INTRAVENOUS | Status: AC
  Administered 2021-10-01: 0.25 mg via INTRAVENOUS
  Filled 2021-10-01: qty 5

## 2021-10-01 MED ORDER — LEUCOVORIN CALCIUM INJECTION 350 MG
400.0000 mg/m2 | Freq: Once | INTRAVENOUS | Status: AC
  Administered 2021-10-01: 804 mg via INTRAVENOUS
  Filled 2021-10-01: qty 40.2

## 2021-10-01 MED ORDER — HEPARIN SOD (PORK) LOCK FLUSH 100 UNIT/ML IV SOLN: 500.0000 [IU] | Freq: Once | INTRAVENOUS | Status: DC | PRN

## 2021-10-01 MED ORDER — SODIUM CHLORIDE 0.9% FLUSH
10.0000 mL | Freq: Once | INTRAVENOUS | Status: AC
  Administered 2021-10-01: 10 mL

## 2021-10-01 NOTE — Patient Instructions (Signed)
[facility]  Discharge Instructions: Thank you for choosing [facility] to provide your oncology and hematology care.   If you have a lab appointment with the [facility], please go directly to the [facility] and check in at the registration area.   Wear comfortable clothing and clothing appropriate for easy access to any Portacath or PICC line.   We strive to give you quality time with your provider. You may need to reschedule your appointment if you arrive late (15 or more minutes).  Arriving late affects you and other patients whose appointments are after yours.  Also, if you miss three or more appointments without notifying the office, you may be dismissed from the clinic at the provider's discretion.      For prescription refill requests, have your pharmacy contact our office and allow 72 hours for refills to be completed.    Today you received the following chemotherapy and/or immunotherapy agents Oxaliplatin, Leucovorin, and 5 FU      To help prevent nausea and vomiting after your treatment, we encourage you to take your nausea medication as directed.  BELOW ARE SYMPTOMS THAT SHOULD BE REPORTED IMMEDIATELY: *FEVER GREATER THAN 100.4 F (38 C) OR HIGHER *CHILLS OR SWEATING *NAUSEA AND VOMITING THAT IS NOT CONTROLLED WITH YOUR NAUSEA MEDICATION *UNUSUAL SHORTNESS OF BREATH *UNUSUAL BRUISING OR BLEEDING *URINARY PROBLEMS (pain or burning when urinating, or frequent urination) *BOWEL PROBLEMS (unusual diarrhea, constipation, pain near the anus) TENDERNESS IN MOUTH AND THROAT WITH OR WITHOUT PRESENCE OF ULCERS (sore throat, sores in mouth, or a toothache) UNUSUAL RASH, SWELLING OR PAIN  UNUSUAL VAGINAL DISCHARGE OR ITCHING   Items with * indicate a potential emergency and should be followed up as soon as possible or go to the Emergency Department if any problems should occur.  Please show the CHEMOTHERAPY ALERT CARD or IMMUNOTHERAPY  ALERT CARD at check-in to the Emergency Department and triage nurse.  Should you have questions after your visit or need to cancel or reschedule your appointment, please contact [facility]  Dept: [phone]  and follow the prompts.  Office hours are 8:00 a.m. to 4:30 p.m. Monday - Friday. Please note that voicemails left after 4:00 p.m. may not be returned until the following business day.  We are closed weekends and major holidays. You have access to a nurse at all times for urgent questions. Please call the main number to the clinic Dept: [phone] and follow the prompts.   For any non-urgent questions, you may also contact your provider using MyChart. We now offer e-Visits for anyone 34 and older to request care online for non-urgent symptoms. For details visit [email or web address].   Also download the MyChart app! Go to the app store, search "MyChart", open the app, select [facility], and log in with your MyChart username and password.  Due to Covid, a mask is required upon entering the hospital/clinic. If you do not have a mask, one will be given to you upon arrival. For doctor visits, patients may have 1 support person aged 64 or older with them. For treatment visits, patients cannot have anyone with them due to current Covid guidelines and our immunocompromised population.

## 2021-10-01 NOTE — Progress Notes (Signed)
Denies anyHematology and Oncology Follow Up Visit  Mary Stark [id number] 1956-06-22 65 y.o. 10/01/2021 11:28 AM Mary Stark, DOAlexander, Mary Bal, DO   [name]: 65 year old woman with bladder cancer diagnosed in 2021.  She developed stage IV adenocarcinoma with pelvic adenopathy.   Prior Therapy:  She is status post retroperitoneal lymph node biopsy completed on 10/24/2020 which showed metastatic carcinoma.  She is status post repeat cystoscopy and bilateral ureteral stent exchange as well as resection of a bladder tumor on January 21, 2021.  A final pathology showed an adenocarcinoma.  Chemotherapy utilizing carboplatin and gemcitabine started on November 22, 2020.  She completed 6 cycles of therapy on March 06, 2021.  Current therapy: FOLFOX chemotherapy started on Apr 15, 2021.  She is here for cycle 11 of therapy.   Interim History: Mary Stark is here for a follow-up visit.  Since her last visit, she reports no major changes in her health.  She reports no excessive fatigue or tiredness.  She denies any nausea or vomiting or abdominal pain.  She denies any worsening neuropathy.  Continues to improve her performance status and able to ambulate without any major difficulties.     Medications: Reviewed without changes. Current Outpatient Medications  Medication Sig Dispense Refill   acetaminophen (TYLENOL) 500 MG tablet Take 1,000 mg by mouth every 8 (eight) hours as needed for moderate pain.      albuterol (PROAIR HFA) 108 (90 Base) MCG/ACT inhaler INHALE 1 TO 2 PUFFS BY MOUTH INTO THE LUNGS EVERY 4 HOURS AS NEEDED FOR WHEEZING OR SHORTNESS OF BREATH 8.5 g 1   amLODipine (NORVASC) 5 MG tablet Take 5 mg by mouth daily.     ARIPiprazole (ABILIFY) 10 MG tablet TAKE 1 TABLET BY MOUTH ONCE DAILY 90 tablet 0   ARIPiprazole 10 MG TABS Take 10 mg by mouth daily. 90 tablet 1   ascorbic acid (VITAMIN C) 500 MG tablet Take 1,000 mg by mouth every evening.       buPROPion (WELLBUTRIN XL) 300 MG 24 hr tablet TAKE 1 TABLET BY MOUTH EVERY DAY 30 tablet 3   cephALEXin (KEFLEX) 250 MG capsule TAKE ONE CAPSULE BY MOUTH NIGHTLY 90 capsule 1   Cholecalciferol (VITAMIN D) 125 MCG (5000 UT) CAPS Take 5,000 Units by mouth every evening.      ciprofloxacin (CIPRO) 500 MG tablet Take 1 tablet (500 mg total) by mouth 2 (two) times daily. 14 tablet 0   clonazePAM (KLONOPIN) 0.5 MG tablet TAKE 1 TABLET BY MOUTH AT BEDTIME 30 tablet 0   Coenzyme Q10 (CO Q-10) 200 MG CAPS Take 200 mg by mouth every evening.      fluticasone-salmeterol (ADVAIR HFA) 115-21 MCG/ACT inhaler Inhale 2 puffs into the lungs 2 (two) times daily. 1 each 0   gabapentin (NEURONTIN) 300 MG capsule TAKE 1 CAPSULE BY MOUTH IN THE MORNING, 1 CAPSULE IN THE AFTERNOON & 4 CAPSULES AT NIGHT 180 capsule 3   GLUCOSAMINE-CHONDROITIN PO Take 2 tablets by mouth every evening.      lidocaine-prilocaine (EMLA) cream Apply 1 application topically as needed. 30 g 0   megestrol (MEGACE) 40 MG tablet Take 1 tablet (40 mg total) by mouth 2 (two) times daily. 60 tablet 1   Meth-Hyo-M Bl-Na Phos-Ph Sal (URIBEL) 118 MG CAPS Take one capsule by mouth 3 times daily 30 capsule 3   mirabegron ER (MYRBETRIQ) 25 MG TB24 tablet TAKE 1 TABLET BY MOUTH ONCE A DAY 30 tablet 3   Nebivolol HCl 20  MG TABS Take 1/2 tablet (10 mg total) by mouth daily. 90 tablet 1   Omega-3 Fatty Acids (FISH OIL) 1200 MG CAPS Take 1,200 mg by mouth every evening.      omeprazole (PRILOSEC) 20 MG capsule Take 1 capsule (20 mg total) by mouth daily. 90 capsule 3   ondansetron (ZOFRAN) 4 MG tablet Take 1 tablet (4 mg total) by mouth every 8 (eight) hours as needed for nausea or vomiting. 40 tablet 3   oxybutynin (DITROPAN) 5 MG tablet TAKE 1 TABLET BY MOUTH EVERY 8 HOURS AS NEEDED 90 tablet 3   oxymetazoline (AFRIN) 0.05 % nasal spray Place 1 spray into both nostrils 2 (two) times daily as needed for congestion.     Phenazopyridine HCl (AZO URINARY PAIN PO)  Take 1 tablet by mouth 3 (three) times daily as needed (bladder spasms).     simvastatin (ZOCOR) 20 MG tablet TAKE 1 TABLET (20 MG TOTAL) BY MOUTH DAILY. 90 tablet 3   tamsulosin (FLOMAX) 0.4 MG CAPS capsule TAKE 1 CAPSULE BY MOUTH AT BEDTIME 30 capsule 3   thiamine 100 MG tablet Take 1 tablet (100 mg total) by mouth daily. 30 tablet 0   traMADol (ULTRAM) 50 MG tablet Take 1 tablet (50 mg total) by mouth every 6 (six) hours as needed. 15 tablet 0   vitamin B-12 (CYANOCOBALAMIN) 1000 MCG tablet Take 2,000 mcg by mouth every evening.      Vitamin E 180 MG (400 UNIT) CAPS Take 400 Units by mouth every evening.      No current facility-administered medications for this visit.     Allergies:  Allergies  Allergen Reactions   Metformin And Related Other (See Comments)    Lactic acidosis    Penicillins Hives and Rash    Reports hives to penicillin and amoxicillin as an adult "a long time ago." Has tolerated cefdinir (09/2020) and cefepime (10/2020) before.   Metformin     NDC Code:00093721201 NDC NUUV:[id number]   Penicillin G Sodium     NDC QQVZ:[id number] NDC RJJO:[id number] NDC WFUX:[id number]      Physical Exam:         Blood pressure (!) 149/84, pulse 78, temperature 98.2 F (36.8 C), temperature source Oral, resp. rate 18, weight 177 lb 8 oz (80.5 kg), SpO2 97 %.        ECOG: 1    General appearance: Alert, awake without any distress. Head: Atraumatic without abnormalities Oropharynx: Without any thrush or ulcers. Eyes: No scleral icterus. Lymph nodes: No lymphadenopathy noted in the cervical, supraclavicular, or axillary nodes Heart:regular rate and rhythm, without any murmurs or gallops.   Lung: Clear to auscultation without any rhonchi, wheezes or dullness to percussion. Abdomin: Soft, nontender without any shifting dullness or ascites. Musculoskeletal: No clubbing or cyanosis. Neurological: No motor or sensory deficits. Skin: No rashes or  lesions.                Lab Results: Lab Results  Component Value Date   WBC 7.0 09/17/2021   HGB 9.7 (L) 09/17/2021   HCT 29.7 (L) 09/17/2021   MCV 103.1 (H) 09/17/2021   PLT 88 (L) 09/17/2021     Chemistry      Component Value Date/Time   NA 140 09/17/2021 0847   NA 142 04/26/2018 1333   K 4.0 09/17/2021 0847   CL 107 09/17/2021 0847   CO2 22 09/17/2021 0847   BUN 10 09/17/2021 0847   BUN 22 04/26/2018 1333   CREATININE  1.11 (H) 09/17/2021 0847   CREATININE 1.32 (H) 06/04/2021 0000   GLU 87 02/07/2013 0000      Component Value Date/Time   CALCIUM 9.0 09/17/2021 0847   ALKPHOS 111 09/17/2021 0847   AST 18 09/17/2021 0847   ALT 9 09/17/2021 0847   BILITOT 0.4 09/17/2021 0847      Impression and Plan:    65 year old woman with:   1.    Stage IV adenocarcinoma of the bladder with pelvic adenopathy.  Her disease status was updated at this time and treatment choices were reviewed.  Risks and benefits of continuing FOLFOX chemotherapy were discussed.  Complications that include nausea, vomiting and worsening neuropathy were discussed.  Plan is to complete 12 cycles and update her staging scans at that time.  2.  IV access: Port-A-Cath currently in use without any issues.   3.  Antiemetics: Compazine and Zofran are available to her.  No worsening nausea or vomiting reported currently.   4.  Renal function surveillance: Kidney function remained stable with creatinine clearance overall close to baseline.   5.  Goals of care: Her disease is incurable although aggressive measures are warranted given her reasonable performance status.   6.  Anemia: Hemoglobin is adequate and does not require any transfusion.   7.  Neutropenia: She is currently receiving growth factor support after each cycle of therapy given risk of neutropenia.  8.  Thrombocytopenia: No active bleeding noted at this time.  Platelet count today is currently around the threshold of  treatment we will proceed continue to monitor for any issues.   9.  Follow-up: In 2 weeks for the next cycle of therapy.   30  minutes were dedicated to this encounter.  The time was spent on reviewing laboratory data, disease status update, addressing complications related to cancer and cancer therapy.   Zola Button, MD 10/19/202211:28 AM

## 2021-10-01 NOTE — Progress Notes (Signed)
Plt count 70.  Per MD ok to proceed with chemotherapy.

## 2021-10-03 ENCOUNTER — Other Ambulatory Visit: Payer: Self-pay

## 2021-10-03 ENCOUNTER — Inpatient Hospital Stay: Payer: No Typology Code available for payment source

## 2021-10-03 VITALS — BP 134/72 | HR 70 | Temp 97.7°F | Resp 18

## 2021-10-03 DIAGNOSIS — C801 Malignant (primary) neoplasm, unspecified: Secondary | ICD-10-CM

## 2021-10-03 DIAGNOSIS — Z95828 Presence of other vascular implants and grafts: Secondary | ICD-10-CM

## 2021-10-03 DIAGNOSIS — Z5111 Encounter for antineoplastic chemotherapy: Secondary | ICD-10-CM | POA: Diagnosis not present

## 2021-10-03 DIAGNOSIS — C669 Malignant neoplasm of unspecified ureter: Secondary | ICD-10-CM

## 2021-10-03 MED ORDER — HEPARIN SOD (PORK) LOCK FLUSH 100 UNIT/ML IV SOLN
500.0000 [IU] | Freq: Once | INTRAVENOUS | Status: AC
  Administered 2021-10-03: 500 [IU]

## 2021-10-03 MED ORDER — SODIUM CHLORIDE 0.9% FLUSH
10.0000 mL | Freq: Once | INTRAVENOUS | Status: AC
  Administered 2021-10-03: 10 mL

## 2021-10-03 MED ORDER — PEGFILGRASTIM-JMDB 6 MG/0.6ML ~~LOC~~ SOSY
6.0000 mg | PREFILLED_SYRINGE | Freq: Once | SUBCUTANEOUS | Status: AC
  Administered 2021-10-03: 6 mg via SUBCUTANEOUS
  Filled 2021-10-03: qty 0.6

## 2021-10-03 NOTE — Patient Instructions (Signed)

## 2021-10-06 ENCOUNTER — Ambulatory Visit (INDEPENDENT_AMBULATORY_CARE_PROVIDER_SITE_OTHER): Payer: No Typology Code available for payment source | Admitting: Sports Medicine

## 2021-10-06 ENCOUNTER — Other Ambulatory Visit: Payer: Self-pay

## 2021-10-06 ENCOUNTER — Other Ambulatory Visit (HOSPITAL_COMMUNITY): Payer: Self-pay

## 2021-10-06 DIAGNOSIS — M170 Bilateral primary osteoarthritis of knee: Secondary | ICD-10-CM

## 2021-10-06 MED ORDER — TRAMADOL HCL 50 MG PO TABS
50.0000 mg | ORAL_TABLET | Freq: Three times a day (TID) | ORAL | 3 refills | Status: DC | PRN
Start: 2021-10-06 — End: 2022-02-24
  Filled 2021-10-06: qty 30, 10d supply, fill #0
  Filled 2021-11-27: qty 30, 10d supply, fill #1
  Filled 2021-12-25: qty 30, 10d supply, fill #2
  Filled 2022-01-29: qty 30, 10d supply, fill #3

## 2021-10-06 NOTE — Progress Notes (Signed)
    Procedures performed today:    None.  Independent interpretation of notes and tests performed by another provider:   None.  Brief History, Exam, Impression, and Recommendations:    Primary osteoarthritis of both knees Mary Stark returns, she is a very pleasant 65 year old female, known bilateral knee osteoarthritis, known meniscal tear in the left postarthroscopy, she did really well post bilateral injections about 2 months ago, did a hike and had some increasing pain left knee with mechanical symptoms consistent for meniscal root tear. At this point her symptoms have improved to some degree so she will continue with tramadol, occasional ibuprofen and arthritis Tylenol twice daily. If in another month she still having some discomfort we will try another injection, if this still does not work we will proceed with MRI for arthroscopy planning. Currently uses tramadol only as needed.  Chronic process with exacerbation and pharmacologic intervention  ___________________________________________ Gwen Her. Dianah Field, M.D., ABFM., CAQSM. Primary Care and [place] Instructor of [employer] [facility] [employer]

## 2021-10-06 NOTE — Assessment & Plan Note (Signed)
Mary Stark returns, she is a very pleasant 65 year old female, known bilateral knee osteoarthritis, known meniscal tear in the left postarthroscopy, she did really well post bilateral injections about 2 months ago, did a hike and had some increasing pain left knee with mechanical symptoms consistent for meniscal root tear. At this point her symptoms have improved to some degree so she will continue with tramadol, occasional ibuprofen and arthritis Tylenol twice daily. If in another month she still having some discomfort we will try another injection, if this still does not work we will proceed with MRI for arthroscopy planning. Currently uses tramadol only as needed.

## 2021-10-14 ENCOUNTER — Other Ambulatory Visit: Payer: Self-pay | Admitting: Urology

## 2021-10-14 ENCOUNTER — Telehealth: Payer: Self-pay | Admitting: *Deleted

## 2021-10-14 NOTE — Telephone Encounter (Signed)
Returned [employer] Accommodation extension request.  Original at entry registration for pick-up.

## 2021-10-15 MED FILL — Dexamethasone Sodium Phosphate Inj 100 MG/10ML: INTRAMUSCULAR | Qty: 1 | Status: AC

## 2021-10-16 ENCOUNTER — Inpatient Hospital Stay: Payer: No Typology Code available for payment source

## 2021-10-16 ENCOUNTER — Inpatient Hospital Stay: Payer: No Typology Code available for payment source | Attending: Oncology

## 2021-10-16 ENCOUNTER — Inpatient Hospital Stay (HOSPITAL_BASED_OUTPATIENT_CLINIC_OR_DEPARTMENT_OTHER): Payer: No Typology Code available for payment source | Admitting: Oncology

## 2021-10-16 ENCOUNTER — Other Ambulatory Visit: Payer: Self-pay

## 2021-10-16 ENCOUNTER — Other Ambulatory Visit (HOSPITAL_COMMUNITY): Payer: Self-pay

## 2021-10-16 VITALS — BP 137/68 | HR 69 | Temp 97.5°F | Resp 20 | Wt 178.1 lb

## 2021-10-16 DIAGNOSIS — C669 Malignant neoplasm of unspecified ureter: Secondary | ICD-10-CM

## 2021-10-16 DIAGNOSIS — D61810 Antineoplastic chemotherapy induced pancytopenia: Secondary | ICD-10-CM | POA: Insufficient documentation

## 2021-10-16 DIAGNOSIS — D630 Anemia in neoplastic disease: Secondary | ICD-10-CM | POA: Insufficient documentation

## 2021-10-16 DIAGNOSIS — Z5189 Encounter for other specified aftercare: Secondary | ICD-10-CM | POA: Insufficient documentation

## 2021-10-16 DIAGNOSIS — D701 Agranulocytosis secondary to cancer chemotherapy: Secondary | ICD-10-CM | POA: Diagnosis not present

## 2021-10-16 DIAGNOSIS — Z95828 Presence of other vascular implants and grafts: Secondary | ICD-10-CM

## 2021-10-16 DIAGNOSIS — T451X5D Adverse effect of antineoplastic and immunosuppressive drugs, subsequent encounter: Secondary | ICD-10-CM | POA: Insufficient documentation

## 2021-10-16 DIAGNOSIS — C801 Malignant (primary) neoplasm, unspecified: Secondary | ICD-10-CM

## 2021-10-16 DIAGNOSIS — Z5111 Encounter for antineoplastic chemotherapy: Secondary | ICD-10-CM | POA: Insufficient documentation

## 2021-10-16 LAB — CMP (CANCER CENTER ONLY)
ALT: 12 U/L (ref 0–44)
AST: 25 U/L (ref 15–41)
Albumin: 3.7 g/dL (ref 3.5–5.0)
Alkaline Phosphatase: 124 U/L (ref 38–126)
Anion gap: 11 (ref 5–15)
BUN: 16 mg/dL (ref 8–23)
CO2: 20 mmol/L — ABNORMAL LOW (ref 22–32)
Calcium: 8.9 mg/dL (ref 8.9–10.3)
Chloride: 107 mmol/L (ref 98–111)
Creatinine: 1.04 mg/dL — ABNORMAL HIGH (ref 0.44–1.00)
GFR, Estimated: >60 mL/min (ref >60)
Glucose, Bld: 88 mg/dL (ref 70–99)
Potassium: 4.1 mmol/L (ref 3.5–5.1)
Sodium: 138 mmol/L (ref 135–145)
Total Bilirubin: 0.4 mg/dL (ref 0.3–1.2)
Total Protein: 7.2 g/dL (ref 6.5–8.1)

## 2021-10-16 LAB — CBC WITH DIFFERENTIAL (CANCER CENTER ONLY)
Abs Immature Granulocytes: 0.24 10*3/uL — ABNORMAL HIGH (ref 0.00–0.07)
Basophils Absolute: 0 10*3/uL (ref 0.0–0.1)
Basophils Relative: 1 %
Eosinophils Absolute: 0.1 10*3/uL (ref 0.0–0.5)
Eosinophils Relative: 1 %
HCT: 25.5 % — ABNORMAL LOW (ref 36.0–46.0)
Hemoglobin: 8.5 g/dL — ABNORMAL LOW (ref 12.0–15.0)
Immature Granulocytes: 4 %
Lymphocytes Relative: 34 %
Lymphs Abs: 2.1 10*3/uL (ref 0.7–4.0)
MCH: 36.8 pg — ABNORMAL HIGH (ref 26.0–34.0)
MCHC: 33.3 g/dL (ref 30.0–36.0)
MCV: 110.4 fL — ABNORMAL HIGH (ref 80.0–100.0)
Monocytes Absolute: 0.9 10*3/uL (ref 0.1–1.0)
Monocytes Relative: 15 %
Neutro Abs: 2.9 10*3/uL (ref 1.7–7.7)
Neutrophils Relative %: 45 %
Platelet Count: 113 10*3/uL — ABNORMAL LOW (ref 150–400)
RBC: 2.31 MIL/uL — ABNORMAL LOW (ref 3.87–5.11)
RDW: 22.8 % — ABNORMAL HIGH (ref 11.5–15.5)
WBC Count: 6.3 10*3/uL (ref 4.0–10.5)
nRBC: 0 % (ref 0.0–0.2)

## 2021-10-16 LAB — SAMPLE TO BLOOD BANK

## 2021-10-16 MED ORDER — SODIUM CHLORIDE 0.9 % IV SOLN
10.0000 mg | Freq: Once | INTRAVENOUS | Status: AC
  Administered 2021-10-16: 10 mg via INTRAVENOUS
  Filled 2021-10-16: qty 10

## 2021-10-16 MED ORDER — SODIUM CHLORIDE 0.9% FLUSH
10.0000 mL | Freq: Once | INTRAVENOUS | Status: AC
  Administered 2021-10-16: 10 mL

## 2021-10-16 MED ORDER — LIDOCAINE-PRILOCAINE 2.5-2.5 % EX CREA
1.0000 | TOPICAL_CREAM | CUTANEOUS | 0 refills | Status: DC | PRN
  Filled 2021-10-16: qty 30, 30d supply, fill #0

## 2021-10-16 MED ORDER — OXALIPLATIN CHEMO INJECTION 100 MG/20ML
64.0000 mg/m2 | Freq: Once | INTRAVENOUS | Status: AC
  Administered 2021-10-16: 130 mg via INTRAVENOUS
  Filled 2021-10-16: qty 20

## 2021-10-16 MED ORDER — HEPARIN SOD (PORK) LOCK FLUSH 100 UNIT/ML IV SOLN: 500.0000 [IU] | Freq: Once | INTRAVENOUS | Status: DC | PRN

## 2021-10-16 MED ORDER — DEXTROSE 5 % IV SOLN
Freq: Once | INTRAVENOUS | Status: AC
Start: 2021-10-16 — End: 2021-10-16

## 2021-10-16 MED ORDER — FLUOROURACIL CHEMO INJECTION 2.5 GM/50ML
400.0000 mg/m2 | Freq: Once | INTRAVENOUS | Status: AC
  Administered 2021-10-16: 800 mg via INTRAVENOUS
  Filled 2021-10-16: qty 16

## 2021-10-16 MED ORDER — SODIUM CHLORIDE 0.9 % IV SOLN
2400.0000 mg/m2 | INTRAVENOUS | Status: DC
  Administered 2021-10-16: 4800 mg via INTRAVENOUS
  Filled 2021-10-16: qty 96

## 2021-10-16 MED ORDER — PALONOSETRON HCL INJECTION 0.25 MG/5ML
0.2500 mg | Freq: Once | INTRAVENOUS | Status: AC
  Administered 2021-10-16: 0.25 mg via INTRAVENOUS
  Filled 2021-10-16: qty 5

## 2021-10-16 MED ORDER — LEUCOVORIN CALCIUM INJECTION 350 MG
400.0000 mg/m2 | Freq: Once | INTRAVENOUS | Status: AC
  Administered 2021-10-16: 804 mg via INTRAVENOUS
  Filled 2021-10-16: qty 40.2

## 2021-10-16 MED ORDER — SODIUM CHLORIDE 0.9% FLUSH: 10.0000 mL | INTRAVENOUS | Status: DC | PRN

## 2021-10-16 NOTE — Patient Instructions (Signed)
[facility] ONCOLOGY  Discharge Instructions: Thank you for choosing [facility] to provide your oncology and hematology care.   If you have a lab appointment with the [facility], please go directly to the [facility] and check in at the registration area.   Wear comfortable clothing and clothing appropriate for easy access to any Portacath or PICC line.   We strive to give you quality time with your provider. You may need to reschedule your appointment if you arrive late (15 or more minutes).  Arriving late affects you and other patients whose appointments are after yours.  Also, if you miss three or more appointments without notifying the office, you may be dismissed from the clinic at the provider's discretion.      For prescription refill requests, have your pharmacy contact our office and allow 72 hours for refills to be completed.    Today you received the following chemotherapy and/or immunotherapy agents Oxaliplatin, Leucovorin, and 5 FU      To help prevent nausea and vomiting after your treatment, we encourage you to take your nausea medication as directed.  BELOW ARE SYMPTOMS THAT SHOULD BE REPORTED IMMEDIATELY: *FEVER GREATER THAN 100.4 F (38 C) OR HIGHER *CHILLS OR SWEATING *NAUSEA AND VOMITING THAT IS NOT CONTROLLED WITH YOUR NAUSEA MEDICATION *UNUSUAL SHORTNESS OF BREATH *UNUSUAL BRUISING OR BLEEDING *URINARY PROBLEMS (pain or burning when urinating, or frequent urination) *BOWEL PROBLEMS (unusual diarrhea, constipation, pain near the anus) TENDERNESS IN MOUTH AND THROAT WITH OR WITHOUT PRESENCE OF ULCERS (sore throat, sores in mouth, or a toothache) UNUSUAL RASH, SWELLING OR PAIN  UNUSUAL VAGINAL DISCHARGE OR ITCHING   Items with * indicate a potential emergency and should be followed up as soon as possible or go to the Emergency Department if any problems should occur.  Please show the CHEMOTHERAPY ALERT CARD or IMMUNOTHERAPY  ALERT CARD at check-in to the Emergency Department and triage nurse.  Should you have questions after your visit or need to cancel or reschedule your appointment, please contact [facility]  Dept: [phone]  and follow the prompts.  Office hours are 8:00 a.m. to 4:30 p.m. Monday - Friday. Please note that voicemails left after 4:00 p.m. may not be returned until the following business day.  We are closed weekends and major holidays. You have access to a nurse at all times for urgent questions. Please call the main number to the clinic Dept: [phone] and follow the prompts.   For any non-urgent questions, you may also contact your provider using MyChart. We now offer e-Visits for anyone 34 and older to request care online for non-urgent symptoms. For details visit [email or web address].   Also download the MyChart app! Go to the app store, search "MyChart", open the app, select [facility], and log in with your MyChart username and password.  Due to Covid, a mask is required upon entering the hospital/clinic. If you do not have a mask, one will be given to you upon arrival. For doctor visits, patients may have 1 support person aged 64 or older with them. For treatment visits, patients cannot have anyone with them due to current Covid guidelines and our immunocompromised population.

## 2021-10-16 NOTE — Progress Notes (Signed)
Denies anyHematology and Oncology Follow Up Visit  Mary Stark [id number] [birth year] 65 y.o. 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   Prior Therapy:  She is status post retroperitoneal lymph node biopsy completed on 10/24/2020 which showed metastatic carcinoma.  She is status post repeat cystoscopy and bilateral ureteral stent exchange as well as resection of a bladder tumor on January 21, 2021.  A final pathology showed an adenocarcinoma.  Chemotherapy utilizing carboplatin and gemcitabine started on November 22, 2020.  She completed 6 cycles of therapy on March 06, 2021.  Current therapy: FOLFOX chemotherapy started on Apr 15, 2021.  She is here for cycle 12 of therapy.   Interim History: [name] returns today for repeat evaluation.  Since the last visit, she reports no major changes in her health.  She did develop upper respiratory tract infection which has resolved at this time.  She denies any nausea, vomiting or abdominal pain.  She denies any recent hospitalizations or illnesses.  She denies any worsening neuropathy or excessive fatigue.  She denies any hematuria or dysuria.  She has tolerated chemotherapy reasonably well at this time.     Medications: Updated on review. Current Outpatient Medications  Medication Sig Dispense Refill   acetaminophen (TYLENOL) 500 MG tablet Take 1,000 mg by mouth every 8 (eight) hours as needed for moderate pain.      albuterol (PROAIR HFA) 108 (90 Base) MCG/ACT inhaler INHALE 1 TO 2 PUFFS BY MOUTH INTO THE LUNGS EVERY 4 HOURS AS NEEDED FOR WHEEZING OR SHORTNESS OF BREATH 8.5 g 1   amLODipine (NORVASC) 5 MG tablet Take 5 mg by mouth daily.     ARIPiprazole (ABILIFY) 10 MG tablet TAKE 1 TABLET BY MOUTH ONCE DAILY 90 tablet 0   ARIPiprazole 10 MG TABS Take 10 mg by mouth daily. 90  tablet 1   ascorbic acid (VITAMIN C) 500 MG tablet Take 1,000 mg by mouth every evening.      buPROPion (WELLBUTRIN XL) 300 MG 24 hr tablet TAKE 1 TABLET BY MOUTH EVERY DAY 30 tablet 3   cephALEXin (KEFLEX) 250 MG capsule TAKE ONE CAPSULE BY MOUTH NIGHTLY 90 capsule 1   Cholecalciferol (VITAMIN D) 125 MCG (5000 UT) CAPS Take 5,000 Units by mouth every evening.      clonazePAM (KLONOPIN) 0.5 MG tablet TAKE 1 TABLET BY MOUTH AT BEDTIME 30 tablet 0   Coenzyme Q10 (CO Q-10) 200 MG CAPS Take 200 mg by mouth every evening.      fluticasone-salmeterol (ADVAIR HFA) 115-21 MCG/ACT inhaler Inhale 2 puffs into the lungs 2 (two) times daily. 1 each 0   gabapentin (NEURONTIN) 300 MG capsule TAKE 1 CAPSULE BY MOUTH IN THE MORNING, 1 CAPSULE IN THE AFTERNOON & 4 CAPSULES AT NIGHT 180 capsule 3   GLUCOSAMINE-CHONDROITIN PO Take 2 tablets by mouth every evening.      lidocaine-prilocaine (EMLA) cream Apply 1 application topically as needed. 30 g 0   megestrol (MEGACE) 40 MG tablet Take 1 tablet (40 mg total) by mouth 2 (two) times daily. 60 tablet 1   Meth-Hyo-M Bl-Na Phos-Ph Sal (URIBEL) 118 MG CAPS Take one capsule by mouth 3 times daily 30 capsule 3   mirabegron ER (MYRBETRIQ) 25 MG TB24 tablet TAKE 1 TABLET BY MOUTH ONCE A DAY 30 tablet 3   Nebivolol HCl 20 MG TABS Take 1/2 tablet (10  mg total) by mouth daily. 90 tablet 1   Omega-3 Fatty Acids (FISH OIL) 1200 MG CAPS Take 1,200 mg by mouth every evening.      omeprazole (PRILOSEC) 20 MG capsule Take 1 capsule (20 mg total) by mouth daily. 90 capsule 3   ondansetron (ZOFRAN) 4 MG tablet Take 1 tablet (4 mg total) by mouth every 8 (eight) hours as needed for nausea or vomiting. 40 tablet 3   oxybutynin (DITROPAN) 5 MG tablet TAKE 1 TABLET BY MOUTH EVERY 8 HOURS AS NEEDED 90 tablet 3   oxymetazoline (AFRIN) 0.05 % nasal spray Place 1 spray into both nostrils 2 (two) times daily as needed for congestion.     Phenazopyridine HCl (AZO URINARY PAIN PO) Take 1 tablet  by mouth 3 (three) times daily as needed (bladder spasms).     simvastatin (ZOCOR) 20 MG tablet TAKE 1 TABLET (20 MG TOTAL) BY MOUTH DAILY. 90 tablet 3   tamsulosin (FLOMAX) 0.4 MG CAPS capsule TAKE 1 CAPSULE BY MOUTH AT BEDTIME 30 capsule 3   thiamine 100 MG tablet Take 1 tablet (100 mg total) by mouth daily. 30 tablet 0   traMADol (ULTRAM) 50 MG tablet Take 1 tablet (50 mg total) by mouth 3 (three) times daily as needed. 30 tablet 3   vitamin B-12 (CYANOCOBALAMIN) 1000 MCG tablet Take 2,000 mcg by mouth every evening.      Vitamin E 180 MG (400 UNIT) CAPS Take 400 Units by mouth every evening.      No current facility-administered medications for this visit.     Allergies:  Allergies  Allergen Reactions   Metformin And Related Other (See Comments)    Lactic acidosis    Penicillins Hives and Rash    Reports hives to penicillin and amoxicillin as an adult "a long time ago." Has tolerated cefdinir (09/2020) and cefepime (10/2020) before.   Metformin     NDC Code:00093721201 NDC KKXF:[id number]   Penicillin G Sodium     NDC CVEL:38101751025 NDC ENID:[id number] NDC ERXV:[id number]      Physical Exam:     Blood pressure 137/68, pulse 69, temperature (!) 97.5 F (36.4 C), resp. rate 20, weight 178 lb 1.6 oz (80.8 kg), SpO2 100 %.            ECOG: 1   General appearance: Comfortable appearing without any discomfort Head: Normocephalic without any trauma Oropharynx: Mucous membranes are moist and pink without any thrush or ulcers. Eyes: Pupils are equal and round reactive to light. Lymph nodes: No cervical, supraclavicular, inguinal or axillary lymphadenopathy.   Heart:regular rate and rhythm.  S1 and S2 without leg edema. Lung: Clear without any rhonchi or wheezes.  No dullness to percussion. Abdomin: Soft, nontender, nondistended with good bowel sounds.  No hepatosplenomegaly. Musculoskeletal: No joint deformity or effusion.  Full range of motion  noted. Neurological: No deficits noted on motor, sensory and deep tendon reflex exam. Skin: No petechial rash or dryness.  Appeared moist.                  Lab Results: Lab Results  Component Value Date   WBC 9.8 10/01/2021   HGB 9.2 (L) 10/01/2021   HCT 27.8 (L) 10/01/2021   MCV 106.1 (H) 10/01/2021   PLT 70 (L) 10/01/2021     Chemistry      Component Value Date/Time   NA 138 10/01/2021 1117   NA 142 04/26/2018 1333   K 4.7 10/01/2021 1117   CL 106 10/01/2021  1117   CO2 20 (L) 10/01/2021 1117   BUN 17 10/01/2021 1117   BUN 22 04/26/2018 1333   CREATININE 1.10 (H) 10/01/2021 1117   CREATININE 1.32 (H) 06/04/2021 0000   GLU 87 02/07/2013 0000      Component Value Date/Time   CALCIUM 9.0 10/01/2021 1117   ALKPHOS 125 10/01/2021 1117   AST 21 10/01/2021 1117   ALT 10 10/01/2021 1117   BILITOT 0.3 10/01/2021 1117         Impression and Plan:    65 year old woman with:   1.    Bladder cancer diagnosed in November 2021.  She presented with stage IV adenocarcinoma with pelvic lymph node involvement.  She continues to tolerate FOLFOX without any major complaints at this time.  Risks and benefits of proceeding with treatment today were discussed.  Complications that include nausea, vomiting and peripheral neuropathy were reiterated.  Alternative treatment options will be maintenance 5-FU and leucovorin without oxaliplatin.  The plan is to update her staging scans after this current cycle and will determine best course of action accordingly.  She is agreeable with this plan.  2.  IV access: Port-A-Cath remains in place and will be in use for future treatments.   3.  Antiemetics: She is currently on Zofran and Compazine with symptoms are reasonably controlled.   4.  Renal function surveillance: Creatinine clearance remained at baseline without any further decline.   5.  Goals of care: Her disease remains incurable although aggressive measures are warranted  given her excellent performance status.   6.  Anemia: Related to malignancy and chemotherapy.  She has received transfusions in the past.  Hemoglobin is adequate and does not require transfusion.   7.  Neutropenia: She is currently receiving growth factor support after each cycle of therapy because of risk of neutropenic sepsis and infection.  8.  Thrombocytopenia: Related to oxaliplatin chemotherapy without any active bleeding.  Platelet count today is adequate and does not require any intervention.   9.  Follow-up: In 2 weeks for the next cycle of therapy.   30  minutes were spent on this visit.  The time was dedicated to reviewing laboratory data, disease status update, treatment choices and future plan of care discussion.   Zola Button, MD 11/3/20228:41 AM

## 2021-10-18 ENCOUNTER — Other Ambulatory Visit: Payer: Self-pay

## 2021-10-18 ENCOUNTER — Inpatient Hospital Stay: Payer: No Typology Code available for payment source

## 2021-10-18 VITALS — BP 134/84 | HR 78 | Temp 97.9°F | Resp 19

## 2021-10-18 DIAGNOSIS — C801 Malignant (primary) neoplasm, unspecified: Secondary | ICD-10-CM

## 2021-10-18 DIAGNOSIS — C669 Malignant neoplasm of unspecified ureter: Secondary | ICD-10-CM

## 2021-10-18 DIAGNOSIS — Z5111 Encounter for antineoplastic chemotherapy: Secondary | ICD-10-CM | POA: Diagnosis not present

## 2021-10-18 MED ORDER — SODIUM CHLORIDE 0.9% FLUSH
10.0000 mL | INTRAVENOUS | Status: DC | PRN
  Administered 2021-10-18: 10 mL

## 2021-10-18 MED ORDER — PEGFILGRASTIM-JMDB 6 MG/0.6ML ~~LOC~~ SOSY
6.0000 mg | PREFILLED_SYRINGE | Freq: Once | SUBCUTANEOUS | Status: AC
  Administered 2021-10-18: 6 mg via SUBCUTANEOUS
  Filled 2021-10-18: qty 0.6

## 2021-10-18 MED ORDER — HEPARIN SOD (PORK) LOCK FLUSH 100 UNIT/ML IV SOLN
500.0000 [IU] | Freq: Once | INTRAVENOUS | Status: AC | PRN
  Administered 2021-10-18: 500 [IU]

## 2021-10-21 ENCOUNTER — Telehealth (INDEPENDENT_AMBULATORY_CARE_PROVIDER_SITE_OTHER): Payer: No Typology Code available for payment source | Admitting: Family Medicine

## 2021-10-21 ENCOUNTER — Encounter: Payer: Self-pay | Admitting: Family Medicine

## 2021-10-21 DIAGNOSIS — J22 Unspecified acute lower respiratory infection: Secondary | ICD-10-CM | POA: Diagnosis not present

## 2021-10-21 MED ORDER — AZITHROMYCIN 250 MG PO TABS: ORAL_TABLET | ORAL | 0 refills | Status: AC

## 2021-10-21 MED ORDER — ALBUTEROL SULFATE HFA 108 (90 BASE) MCG/ACT IN AERS: INHALATION_SPRAY | RESPIRATORY_TRACT | 1 refills | Status: DC

## 2021-10-21 MED ORDER — BENZONATATE 200 MG PO CAPS: 200.0000 mg | ORAL_CAPSULE | Freq: Three times a day (TID) | ORAL | 0 refills | Status: DC | PRN

## 2021-10-21 NOTE — Progress Notes (Signed)
Cough x 2 wks. Started to go away and came back. No f/s/c/n/v/d. No headaches,loss of taste or smell.   No COVID test taken.  Taken Mucinex for her cough. It has made the

## 2021-10-21 NOTE — Progress Notes (Signed)
Virtual Visit via Video Note  I connected with [name] on 10/21/21 at 11:10 AM EST by a video enabled telemedicine application and verified that I am speaking with the correct person using two identifiers.   I discussed the limitations of evaluation and management by telemedicine and the availability of in person appointments. The patient expressed understanding and agreed to proceed.  Patient location: at home Provider location: in office  Subjective:    CC:   HPI: Cough x 2 wks. Started to go away and came back. No f/s/c/n/v/d. No headaches,loss of taste or smell.  Also has runny nose. No temp.  Had sinus and ear pain initially but that is better.  No GI sxs.  Says out of her inhaler.  No COVID test taken. Taken Mucinex for her cough. Yolanda Bonine was sick. Cough his waking her up at night.  Had to sleep in her recliner.  + wheezing. No chest pain.  Maybe a little SOB, but she anemic as well.    Past medical history, Surgical history, Family history not pertinant except as noted below, Social history, Allergies, and medications have been entered into the medical record, reviewed, and corrections made.    Objective:    General: Speaking clearly in complete sentences without any shortness of breath.  Alert and oriented x3.  Normal judgment. No apparent acute distress.    Impression and Recommendations:    No problem-specific Assessment & Plan notes found for this encounter.  Acute lower respiratory tract infection-she is actively immunosuppressed and on chemotherapy.  She started out with what sounds like a viral upper respiratory infection about 2 weeks ago.  And started to improve but in the last few days is actually started to feel worse and her cough is increased.  We discussed treating for secondary bacterial infection at this point.  We will treat with azithromycin based on penicillin allergy list.  Call if not better towards the end of the week or if she develops new or  worsening symptoms such as increased shortness of breath.  Also sent over a new updated albuterol inhaler since she is currently out of her inhaler which she has been using for some wheezing.  Tessalon pearls sent for symptomatic relief.  No orders of the defined types were placed in this encounter.   Meds ordered this encounter  Medications   albuterol (PROAIR HFA) 108 (90 Base) MCG/ACT inhaler    Sig: INHALE 1 TO 2 PUFFS BY MOUTH INTO THE LUNGS EVERY 4 HOURS AS NEEDED FOR WHEEZING OR SHORTNESS OF BREATH    Dispense:  8 g    Refill:  1   azithromycin (ZITHROMAX) 250 MG tablet    Sig: 2 Ttabs PO on Day 1, then one a day x 4 days.    Dispense:  6 tablet    Refill:  0   benzonatate (TESSALON) 200 MG capsule    Sig: Take 1 capsule (200 mg total) by mouth 3 (three) times daily as needed for cough.    Dispense:  30 capsule    Refill:  0     I discussed the assessment and treatment plan with the patient. The patient was provided an opportunity to ask questions and all were answered. The patient agreed with the plan and demonstrated an understanding of the instructions.   The patient was advised to call back or seek an in-person evaluation if the symptoms worsen or if the condition fails to improve as anticipated.   Beatrice Lecher,  MD

## 2021-10-22 ENCOUNTER — Encounter (HOSPITAL_BASED_OUTPATIENT_CLINIC_OR_DEPARTMENT_OTHER): Payer: Self-pay | Admitting: Urology

## 2021-10-22 ENCOUNTER — Other Ambulatory Visit: Payer: Self-pay

## 2021-10-22 NOTE — Progress Notes (Signed)
Spoke w/ via phone for pre-op interview---pt  Lab needs dos---- Istat,              Lab results------EKG in epic 05/23/2021 COVID test -----patient states asymptomatic no test needed Arrive at -------0715 on 10/28/2021 NPO after MN NO Solid Food.  Clear liquids from MN until--- Med rec completed Medications to take morning of surgery -----Ultram,Tylenol, albuterol inhaler, nuerontin, wellbutrin, Zofran, Norvasc  Diabetic medication -----n/a Patient instructed no nail polish to be worn day of surgery Patient instructed to bring photo id and insurance card day of surgery Patient aware to have Driver (ride ) / caregiver Larey Days   for 24 hours after surgery  Patient Special Instructions -----none Pre-Op special Istructions -----none Patient verbalized understanding of instructions that were given at this phone interview. Patient denies shortness of breath, chest pain, fever, cough at this phone interview.  Pt has a cough and having covid test on Friday November 11,2022.  Pt has nose piercing and she cant removed and has 2 ears in left ear that are new and can't remove.

## 2021-10-23 ENCOUNTER — Ambulatory Visit (HOSPITAL_COMMUNITY)
Admission: RE | Admit: 2021-10-23 | Discharge: 2021-10-23 | Disposition: A | Discharge status: Home / Self Care | Payer: No Typology Code available for payment source | Source: Ambulatory Visit | Attending: Oncology | Admitting: Oncology

## 2021-10-23 DIAGNOSIS — C669 Malignant neoplasm of unspecified ureter: Secondary | ICD-10-CM | POA: Insufficient documentation

## 2021-10-23 MED ORDER — IOHEXOL 350 MG/ML SOLN
80.0000 mL | Freq: Once | INTRAVENOUS | Status: AC | PRN
  Administered 2021-10-23: 80 mL via INTRAVENOUS

## 2021-10-24 ENCOUNTER — Other Ambulatory Visit: Payer: Self-pay | Admitting: Urology

## 2021-10-24 LAB — SARS CORONAVIRUS 2 (TAT 6-24 HRS): SARS Coronavirus 2: NEGATIVE

## 2021-10-27 NOTE — H&P (Signed)
CC/HPI: cc: bilateral hydronephrosis, metastatic squamous cell carcinoma    10/09/21: 65 year old woman initially seen for incidental finding of asymptomatic right hydronephrosis which then progressed to bilateral hydronephrosis and underwent diagnostic ureteroscopy x2 the last being 07/05/2020. Biopsies will also ultimately negative for malignancy and we discussed a trial of steroids to see if this was some usual inflammatory process. Repeat CT scan of the abdomen pelvis were performed today to reassess hydronephrosis and inflammation. She then underwent retroperitoneal lymph node biopsy by IR and was found to have metastatic cancer either GU or gyn primary. She continues chemotherapy under the management of Dr. Alen Blew. During her last stent exchange she was noted to have a mass at the left ureteral orifice and biopsy showed squamous cell carcinoma. Patient's last stent exchange June 2022. She has been maintained on daily cephalexin for UTI prophylaxis. Following patient's last exchange she was hospitalized for 4 days for urosepsis.     ALLERGIES: Metformin penicillin    MEDICATIONS: Myrbetriq 25 mg tablet, extended release 24 hr TAKE 1 TABLET BY MOUTH ONCE A DAY  Myrbetriq 25 mg tablet, extended release 24 hr TAKE 1 TABLET BY MOUTH ONCE A DAY  Oxybutynin Chloride 5 mg tablet 1 tablet PO Q 8 H PRN  Oxybutynin Chloride 5 mg tablet TAKE 1 TABLET BY MOUTH EVERY 8 HOURS AS NEEDED  Tamsulosin Hcl 0.4 mg capsule TAKE 1 CAPSULE BY MOUTH AT BEDTIME  Tamsulosin Hcl 0.4 mg capsule TAKE 1 CAPSULE BY MOUTH AT BEDTIME  Abilify  Bystolic  Cephalexin 161 mg tablet 1 tablet PO Daily take one tablet nightly  Hydrocodone-Acetaminophen 5 mg-325 mg tablet 1 tablet PO Q 6 H PRN flank pain  Klonopin  Neurontin  Norvasc  Wellbutrin Sr     GU PSH: Cysto Uretero Biopsy Fulgura, Bilateral - 07/05/2020, Right - 06/11/2020 Cystoscopy Insert Stent, Bilateral - 05/20/2021, Bilateral - 01/21/2021, Bilateral - 07/05/2020,  Bilateral - 06/11/2020 Cystoscopy TURBT <2 cm - 01/21/2021 Locm 300-399Mg /Ml Iodine,1Ml - 09/09/2020, 05/02/2020     NON-GU PSH: Hand/finger Surgery Tonsillectomy     GU PMH: Acute Cystitis/UTI - 06/06/2021 Hydronephrosis - 06/06/2021, - 04/14/2021, - 02/18/2021, CT reveals worsening left hydro and enhancing material in right renal pelvis concerning for malignancy. Overall she also has increasing lymphadenopathy in retroperitoneum as well as edema in sacral/peri-rectal regions. The patient recently completed steroids with taper and also had COVID still on O2. We discussed the findings and lack of certainty if this is malignancy vs inflammatory. Will see if IR can biopsy retroperitoneal LAD. Called patient and discussed this In detail. Stents to remain in place for now. , - 09/09/2020, Based on noncontrast CT the abdomen pelvis is difficult to further evaluate ureters. We discussed obtaining a BMP to look at renal function and proceeding with a CT urogram. If hydronephrosis has resolved then no further investigating will be done however if hydronephrosis is still present we did discuss proceeding with cystoscopy, retrograde pyelogram and diagnostic ureteroscopy. I will follow-up the patient after the CT scan., - 0/96/0454 Right uncertain neoplasm of kidney - 09/09/2020 Flank Pain - 06/04/2020    NON-GU PMH: Metastatic lymphadenopathy of multiple regions - 04/14/2021 Anxiety Arthritis Depression Encounter for general adult medical examination without abnormal findings, Encounter for preventive health examination Hepatitis C Hypercholesterolemia Hypertension    FAMILY HISTORY: Kidney Stones - Grandfather Protein S Deficiency - Brother   SOCIAL HISTORY: Marital Status: Married Preferred Language: English; Ethnicity: Not Hispanic Or Latino; Race: White Current Smoking Status: Patient does not smoke  anymore.   Tobacco Use Assessment Completed: Used Tobacco in last 30 days? Social Drinker.  Drinks 4+  caffeinated drinks per day.    REVIEW OF SYSTEMS:    GU Review Female:   Patient reports frequent urination, hard to postpone urination, burning /pain with urination, and get up at night to urinate. Patient denies leakage of urine, stream starts and stops, trouble starting your stream, have to strain to urinate, and being pregnant.  Gastrointestinal (Upper):   Patient denies nausea, vomiting, and indigestion/ heartburn.  Gastrointestinal (Lower):   Patient reports diarrhea. Patient denies constipation.  Constitutional:   Patient reports fatigue. Patient denies fever, night sweats, and weight loss.  Skin:   Patient denies skin rash/ lesion and itching.  Eyes:   Patient denies blurred vision and double vision.  Ears/ Nose/ Throat:   Patient denies sore throat and sinus problems.  Hematologic/Lymphatic:   Patient reports easy bruising. Patient denies swollen glands.  Cardiovascular:   Patient denies leg swelling and chest pains.  Respiratory:   Patient denies cough and shortness of breath.  Endocrine:   Patient denies excessive thirst.  Musculoskeletal:   Patient denies back pain and joint pain.  Neurological:   Patient denies headaches and dizziness.  Psychologic:   Patient reports depression and anxiety.    Notes: pain with urinating     VITAL SIGNS:      10/09/2021 09:08 AM  BP 132/82 mmHg  Heart Rate 73 /min  Temperature 98.2 F / 36.7 C   MULTI-SYSTEM PHYSICAL EXAMINATION:    Constitutional: Well-nourished. No physical deformities. Normally developed. Good grooming.  Neck: Neck symmetrical, not swollen. Normal tracheal position.  Respiratory: No labored breathing, no use of accessory muscles.   Skin: No paleness, no jaundice, no cyanosis. No lesion, no ulcer, no rash.  Neurologic / Psychiatric: Oriented to time, oriented to place, oriented to person. No depression, no anxiety, no agitation.  Gastrointestinal: No rigidity, non obese abdomen.   Eyes: Normal conjunctivae. Normal  eyelids.  Ears, Nose, Mouth, and Throat: Left ear no scars, no lesions, no masses. Right ear no scars, no lesions, no masses. Nose no scars, no lesions, no masses. Normal hearing. Normal lips.  Musculoskeletal: Normal gait and station of head and neck.     Complexity of Data:  Records Review:   Previous Patient Records, POC Tool  Urine Test Review:   Urinalysis  X-Ray Review: C.T. Abdomen/Pelvis: Reviewed Films. Reviewed Report. Discussed With Patient. IMPRESSION: 1. Interval decrease in size of numerous enlarged, stranded appearing retroperitoneal lymph nodes, consistent with treatment response of nodal metastatic disease. 2. Redemonstrated bilateral double-J ureteral stents with formed pigtails in the renal pelves and urinary bladder. Unchanged soft tissue thickening about the right renal pelvis in keeping with urothelial malignancy. 3. Unchanged moderate left hydronephrosis. 4. Unchanged 5 mm nodule of the dependent right lower lobe, which remains nonspecific although most likely benign and incidental sequelae of prior infection or inflammation. Continued attention on follow-up. 5. Redemonstrated peripheral irregular ground-glass opacity throughout the lungs, with a bibasilar predominance, featuring arcing, bandlike subpleural elements and subpleural sparing. As on prior, findings are consistent with mild fibrotic sequelae of prior COVID pneumonia. Aortic Atherosclerosis (ICD10-I70.0). Electronically Signed By: Eddie Candle M.D. On: 08/25/2021 11:32   Notes:                     10/01/2021: BUN 17, creatinine 1.1   PROCEDURES:         PVR Ultrasound - 55374  Scanned Volume: 00 cc         Urinalysis w/Scope Dipstick Dipstick Cont'd Micro  Color: Amber Bilirubin: Neg mg/dL WBC/hpf: 10 - 20/hpf  Appearance: Slightly Cloudy Ketones: Neg mg/dL RBC/hpf: 20 - 40/hpf  Specific Gravity: 1.020 Blood: 3+ ery/uL Bacteria: Few (10-25/hpf)  pH: 6.5 Protein: 2+ mg/dL Cystals: NS (Not Seen)  Glucose: Neg  mg/dL Urobilinogen: 0.2 mg/dL Casts: NS (Not Seen)    Nitrites: Neg Trichomonas: Not Present    Leukocyte Esterase: 1+ leu/uL Mucous: Present      Epithelial Cells: 0 - 5/hpf      Yeast: NS (Not Seen)      Sperm: Not Present    ASSESSMENT:      ICD-10 Details  1 GU:   Hydronephrosis - N13.0 Chronic, Stable  2 NON-GU:   Metastatic lymphadenopathy of multiple regions - C77.8 Chronic, Stable   PLAN:           Document Letter(s):  Created for Patient: Clinical Summary         Notes:   Hydronephrosis: Patient is due for bilateral ureteral stent exchange. Again we discussed continuing daily cephalexin for prophylaxis. We also talked about the potential need for PCN tubes if stents are unable to be exchanged. Patient understands this. Risks and benefits of stent exchange were discussed in detail the patient. She is elected to proceed. She will be scheduled for surgery.

## 2021-10-28 ENCOUNTER — Encounter (HOSPITAL_BASED_OUTPATIENT_CLINIC_OR_DEPARTMENT_OTHER): Payer: Self-pay | Admitting: Urology

## 2021-10-28 ENCOUNTER — Other Ambulatory Visit (HOSPITAL_COMMUNITY): Payer: Self-pay

## 2021-10-28 ENCOUNTER — Ambulatory Visit (HOSPITAL_BASED_OUTPATIENT_CLINIC_OR_DEPARTMENT_OTHER): Payer: No Typology Code available for payment source | Admitting: Anesthesiology

## 2021-10-28 ENCOUNTER — Other Ambulatory Visit: Payer: Self-pay | Admitting: Family Medicine

## 2021-10-28 ENCOUNTER — Encounter (HOSPITAL_BASED_OUTPATIENT_CLINIC_OR_DEPARTMENT_OTHER)
Admission: RE | Disposition: A | Discharge status: Home / Self Care | Payer: Self-pay | Source: Ambulatory Visit | Attending: Urology

## 2021-10-28 ENCOUNTER — Ambulatory Visit (HOSPITAL_BASED_OUTPATIENT_CLINIC_OR_DEPARTMENT_OTHER)
Admission: RE | Admit: 2021-10-28 | Discharge: 2021-10-28 | Disposition: A | Discharge status: Home / Self Care | Payer: No Typology Code available for payment source | Source: Ambulatory Visit | Attending: Urology | Admitting: Urology

## 2021-10-28 ENCOUNTER — Other Ambulatory Visit: Payer: Self-pay

## 2021-10-28 DIAGNOSIS — D759 Disease of blood and blood-forming organs, unspecified: Secondary | ICD-10-CM | POA: Diagnosis not present

## 2021-10-28 DIAGNOSIS — C679 Malignant neoplasm of bladder, unspecified: Secondary | ICD-10-CM | POA: Diagnosis not present

## 2021-10-28 DIAGNOSIS — N131 Hydronephrosis with ureteral stricture, not elsewhere classified: Secondary | ICD-10-CM | POA: Insufficient documentation

## 2021-10-28 DIAGNOSIS — K219 Gastro-esophageal reflux disease without esophagitis: Secondary | ICD-10-CM | POA: Insufficient documentation

## 2021-10-28 DIAGNOSIS — Z79899 Other long term (current) drug therapy: Secondary | ICD-10-CM | POA: Insufficient documentation

## 2021-10-28 DIAGNOSIS — I10 Essential (primary) hypertension: Secondary | ICD-10-CM | POA: Insufficient documentation

## 2021-10-28 DIAGNOSIS — Z87891 Personal history of nicotine dependence: Secondary | ICD-10-CM | POA: Diagnosis not present

## 2021-10-28 DIAGNOSIS — C778 Secondary and unspecified malignant neoplasm of lymph nodes of multiple regions: Secondary | ICD-10-CM | POA: Insufficient documentation

## 2021-10-28 DIAGNOSIS — Z792 Long term (current) use of antibiotics: Secondary | ICD-10-CM | POA: Insufficient documentation

## 2021-10-28 DIAGNOSIS — D649 Anemia, unspecified: Secondary | ICD-10-CM | POA: Diagnosis not present

## 2021-10-28 HISTORY — PX: CYSTOSCOPY W/ URETERAL STENT PLACEMENT: SHX1429

## 2021-10-28 HISTORY — DX: Pneumonia, unspecified organism: J18.9

## 2021-10-28 LAB — POCT I-STAT, CHEM 8
BUN: 23 mg/dL (ref 8–23)
Calcium, Ion: 1.23 mmol/L (ref 1.15–1.40)
Chloride: 106 mmol/L (ref 98–111)
Creatinine, Ser: 1.6 mg/dL — ABNORMAL HIGH (ref 0.44–1.00)
Glucose, Bld: 96 mg/dL (ref 70–99)
HCT: 27 % — ABNORMAL LOW (ref 36.0–46.0)
Hemoglobin: 9.2 g/dL — ABNORMAL LOW (ref 12.0–15.0)
Potassium: 4.2 mmol/L (ref 3.5–5.1)
Sodium: 140 mmol/L (ref 135–145)
TCO2: 22 mmol/L (ref 22–32)

## 2021-10-28 SURGERY — CYSTOSCOPY, WITH RETROGRADE PYELOGRAM AND URETERAL STENT INSERTION
Anesthesia: General | Site: Ureter | Laterality: Bilateral

## 2021-10-28 MED ORDER — ONDANSETRON HCL 4 MG/2ML IJ SOLN
INTRAMUSCULAR | Status: AC
  Filled 2021-10-28: qty 2

## 2021-10-28 MED ORDER — PHENYLEPHRINE 40 MCG/ML (10ML) SYRINGE FOR IV PUSH (FOR BLOOD PRESSURE SUPPORT)
PREFILLED_SYRINGE | INTRAVENOUS | Status: DC | PRN
  Administered 2021-10-28: 120 ug via INTRAVENOUS

## 2021-10-28 MED ORDER — SODIUM CHLORIDE 0.9 % IV SOLN: INTRAVENOUS | Status: DC

## 2021-10-28 MED ORDER — PHENYLEPHRINE 40 MCG/ML (10ML) SYRINGE FOR IV PUSH (FOR BLOOD PRESSURE SUPPORT)
PREFILLED_SYRINGE | INTRAVENOUS | Status: AC
  Filled 2021-10-28: qty 10

## 2021-10-28 MED ORDER — ACETAMINOPHEN 500 MG PO TABS
ORAL_TABLET | ORAL | Status: AC
  Filled 2021-10-28: qty 2

## 2021-10-28 MED ORDER — MIDAZOLAM HCL 2 MG/2ML IJ SOLN
INTRAMUSCULAR | Status: AC
  Filled 2021-10-28: qty 2

## 2021-10-28 MED ORDER — FENTANYL CITRATE (PF) 100 MCG/2ML IJ SOLN: 25.0000 ug | INTRAMUSCULAR | Status: DC | PRN

## 2021-10-28 MED ORDER — DEXAMETHASONE SODIUM PHOSPHATE 10 MG/ML IJ SOLN
INTRAMUSCULAR | Status: DC | PRN
  Administered 2021-10-28: 5 mg via INTRAVENOUS

## 2021-10-28 MED ORDER — IOHEXOL 300 MG/ML  SOLN
INTRAMUSCULAR | Status: DC | PRN
  Administered 2021-10-28: 10 mL

## 2021-10-28 MED ORDER — STERILE WATER FOR IRRIGATION IR SOLN
Status: DC | PRN
  Administered 2021-10-28: 3000 mL

## 2021-10-28 MED ORDER — CIPROFLOXACIN HCL 250 MG PO TABS
250.0000 mg | ORAL_TABLET | Freq: Every day | ORAL | 0 refills | Status: DC
Start: 2021-10-28 — End: 2021-11-03
  Filled 2021-10-28: qty 20, 20d supply, fill #0

## 2021-10-28 MED ORDER — LIDOCAINE 2% (20 MG/ML) 5 ML SYRINGE
INTRAMUSCULAR | Status: DC | PRN
  Administered 2021-10-28: 60 mg via INTRAVENOUS

## 2021-10-28 MED ORDER — CIPROFLOXACIN IN D5W 400 MG/200ML IV SOLN
INTRAVENOUS | Status: AC
  Filled 2021-10-28: qty 200

## 2021-10-28 MED ORDER — KETOROLAC TROMETHAMINE 30 MG/ML IJ SOLN
INTRAMUSCULAR | Status: AC
  Filled 2021-10-28: qty 1

## 2021-10-28 MED ORDER — FENTANYL CITRATE (PF) 100 MCG/2ML IJ SOLN
INTRAMUSCULAR | Status: DC | PRN
  Administered 2021-10-28: 25 ug via INTRAVENOUS

## 2021-10-28 MED ORDER — ONDANSETRON HCL 4 MG/2ML IJ SOLN
INTRAMUSCULAR | Status: DC | PRN
  Administered 2021-10-28: 4 mg via INTRAVENOUS

## 2021-10-28 MED ORDER — FENTANYL CITRATE (PF) 100 MCG/2ML IJ SOLN
INTRAMUSCULAR | Status: AC
  Filled 2021-10-28: qty 2

## 2021-10-28 MED ORDER — ACETAMINOPHEN 500 MG PO TABS: 1000.0000 mg | ORAL_TABLET | Freq: Once | ORAL | Status: DC

## 2021-10-28 MED ORDER — PROPOFOL 10 MG/ML IV BOLUS
INTRAVENOUS | Status: DC | PRN
  Administered 2021-10-28: 150 mg via INTRAVENOUS

## 2021-10-28 MED ORDER — CIPROFLOXACIN IN D5W 400 MG/200ML IV SOLN
400.0000 mg | INTRAVENOUS | Status: AC
  Administered 2021-10-28: 400 mg via INTRAVENOUS

## 2021-10-28 MED ORDER — MIDAZOLAM HCL 2 MG/2ML IJ SOLN
INTRAMUSCULAR | Status: DC | PRN
  Administered 2021-10-28: 2 mg via INTRAVENOUS

## 2021-10-28 MED ORDER — PROPOFOL 10 MG/ML IV BOLUS
INTRAVENOUS | Status: AC
  Filled 2021-10-28: qty 20

## 2021-10-28 MED ORDER — LIDOCAINE 2% (20 MG/ML) 5 ML SYRINGE
INTRAMUSCULAR | Status: AC
  Filled 2021-10-28: qty 5

## 2021-10-28 MED ORDER — KETOROLAC TROMETHAMINE 30 MG/ML IJ SOLN
INTRAMUSCULAR | Status: DC | PRN
  Administered 2021-10-28: 30 mg via INTRAVENOUS

## 2021-10-28 MED ORDER — DEXAMETHASONE SODIUM PHOSPHATE 10 MG/ML IJ SOLN
INTRAMUSCULAR | Status: AC
  Filled 2021-10-28: qty 1

## 2021-10-28 MED FILL — Dexamethasone Sodium Phosphate Inj 100 MG/10ML: INTRAMUSCULAR | Qty: 1 | Status: AC

## 2021-10-28 SURGICAL SUPPLY — 21 items
BAG DRAIN URO-CYSTO SKYTR STRL (DRAIN) ×3 IMPLANT
BAG DRN UROCATH (DRAIN) ×1
BASKET ZERO TIP NITINOL 2.4FR (BASKET) IMPLANT
BSKT STON RTRVL ZERO TP 2.4FR (BASKET)
CATH URET 5FR 28IN OPEN ENDED (CATHETERS) ×3 IMPLANT
CLOTH BEACON ORANGE TIMEOUT ST (SAFETY) ×3 IMPLANT
DRSG TEGADERM 4X4.75 (GAUZE/BANDAGES/DRESSINGS) IMPLANT
EXTRACTOR STONE 1.7FRX115CM (UROLOGICAL SUPPLIES) IMPLANT
GLOVE SURG ENC MOIS LTX SZ6.5 (GLOVE) ×3 IMPLANT
GOWN STRL REUS W/TWL LRG LVL3 (GOWN DISPOSABLE) ×3 IMPLANT
GUIDEWIRE STR DUAL SENSOR (WIRE) ×3 IMPLANT
IV NS IRRIG 3000ML ARTHROMATIC (IV SOLUTION) ×3 IMPLANT
KIT TURNOVER CYSTO (KITS) ×3 IMPLANT
MANIFOLD NEPTUNE II (INSTRUMENTS) ×3 IMPLANT
PACK CYSTO (CUSTOM PROCEDURE TRAY) ×3 IMPLANT
STENT URET 6FRX24 CONTOUR (STENTS) ×6 IMPLANT
TRACTIP FLEXIVA PULS ID 200XHI (Laser) IMPLANT
TRACTIP FLEXIVA PULSE ID 200 (Laser)
TUBE CONNECTING 12'X1/4 (SUCTIONS) ×1
TUBE CONNECTING 12X1/4 (SUCTIONS) ×2 IMPLANT
TUBING UROLOGY SET (TUBING) ×3 IMPLANT

## 2021-10-28 NOTE — Interval H&P Note (Signed)
History and Physical Interval Note:  10/28/2021 7:26 AM  Mary Stark  has presented today for surgery, with the diagnosis of HYDRONEPHROSIS.  The various methods of treatment have been discussed with the patient and family. After consideration of risks, benefits and other options for treatment, the patient has consented to  Procedure(s): CYSTOSCOPY WITH RETROGRADE PYELOGRAM/URETERAL STENT EXCHANGE (Bilateral) as a surgical intervention.  The patient's history has been reviewed, patient examined, no change in status, stable for surgery.  I have reviewed the patient's chart and labs.  Questions were answered to the patient's satisfaction.     Rogan Ecklund D Persis Graffius

## 2021-10-28 NOTE — Anesthesia Postprocedure Evaluation (Signed)
Anesthesia Post Note  Patient: KELLEIGH SKERRITT  Procedure(s) Performed: CYSTOSCOPY WITH RETROGRADE PYELOGRAM/URETERAL STENT EXCHANGE (Bilateral: Ureter)     Patient location during evaluation: PACU Anesthesia Type: General Level of consciousness: awake and alert Pain management: pain level controlled Vital Signs Assessment: post-procedure vital signs reviewed and stable Respiratory status: spontaneous breathing, nonlabored ventilation, respiratory function stable and patient connected to nasal cannula oxygen Cardiovascular status: blood pressure returned to baseline and stable Postop Assessment: no apparent nausea or vomiting Anesthetic complications: no   No notable events documented.  Last Vitals:  Vitals:   10/28/21 1001 10/28/21 1027  BP: (!) 132/96 (!) 148/77  Pulse:  (!) 58  Resp:  16  Temp: (!) 36.4 C (!) 36.3 C  SpO2:  100%    Last Pain:  Vitals:   10/28/21 1027  TempSrc:   PainSc: 2                  Ellionna Buckbee L Ajia Chadderdon

## 2021-10-28 NOTE — Anesthesia Procedure Notes (Signed)
Procedure Name: LMA Insertion Date/Time: 10/28/2021 9:08 AM Performed by: Suan Halter, CRNA Pre-anesthesia Checklist: Patient identified, Emergency Drugs available, Suction available and Patient being monitored Patient Re-evaluated:Patient Re-evaluated prior to induction Oxygen Delivery Method: Circle system utilized Preoxygenation: Pre-oxygenation with 100% oxygen Induction Type: IV induction Ventilation: Mask ventilation without difficulty LMA: LMA inserted LMA Size: 4.0 Number of attempts: 1 Airway Equipment and Method: Bite block Placement Confirmation: positive ETCO2 Tube secured with: Tape Dental Injury: Teeth and Oropharynx as per pre-operative assessment

## 2021-10-28 NOTE — Transfer of Care (Signed)
Immediate Anesthesia Transfer of Care Note  Patient: Mary Stark  Procedure(s) Performed: Procedure(s) (LRB): CYSTOSCOPY WITH RETROGRADE PYELOGRAM/URETERAL STENT EXCHANGE (Bilateral)  Patient Location: PACU  Anesthesia Type: General  Level of Consciousness: awake, oriented, sedated and patient cooperative  Airway & Oxygen Therapy: Patient Spontanous Breathing and Patient connected to face mask oxygen  Post-op Assessment: Report given to PACU RN and Post -op Vital signs reviewed and stable  Post vital signs: Reviewed and stable  Complications: No apparent anesthesia complications  Last Vitals:  Vitals Value Taken Time  BP 118/69 10/28/21 0933  Temp    Pulse 62 10/28/21 0939  Resp 16 10/28/21 0939  SpO2 95 % 10/28/21 0939  Vitals shown include unvalidated device data.  Last Pain:  Vitals:   10/28/21 0736  TempSrc: Oral      Patients Stated Pain Goal: 4 (63/84/66 5993)  Complications: No notable events documented.

## 2021-10-28 NOTE — Anesthesia Preprocedure Evaluation (Addendum)
Anesthesia Evaluation  Patient identified by MRN, date of birth, ID band Patient awake    Reviewed: Allergy & Precautions, NPO status , Patient's Chart, lab work & pertinent test results, reviewed documented beta blocker date and time   Airway Mallampati: II  TM Distance: >3 FB Neck ROM: Full    Dental no notable dental hx. (+) Teeth Intact, Dental Advisory Given   Pulmonary neg pulmonary ROS, former smoker,    Pulmonary exam normal breath sounds clear to auscultation       Cardiovascular hypertension, Pt. on medications and Pt. on home beta blockers Normal cardiovascular exam Rhythm:Regular Rate:Normal     Neuro/Psych PSYCHIATRIC DISORDERS Anxiety Depression negative neurological ROS     GI/Hepatic GERD  Controlled and Medicated,(+)     substance abuse (remote history EtOH)  alcohol use, Hepatitis -, C  Endo/Other  negative endocrine ROS  Renal/GU Renal InsufficiencyRenal diseaseLab Results      Component                Value               Date                      CREATININE               1.04 (H)            10/16/2021                BUN                      16                  10/16/2021                NA                       138                 10/16/2021                K                        4.1                 10/16/2021                CL                       107                 10/16/2021                CO2                      20 (L)              10/16/2021             negative genitourinary   Musculoskeletal negative musculoskeletal ROS (+)   Abdominal   Peds  Hematology  (+) Blood dyscrasia (Hgb 8.5), anemia , Lab Results      Component                Value               Date  WBC                      6.3                 10/16/2021                HGB                      8.5 (L)             10/16/2021                HCT                      25.5 (L)            10/16/2021                 MCV                      110.4 (H)           10/16/2021                PLT                      113 (L)             10/16/2021              Anesthesia Other Findings Stage 4 bladder CA  Reproductive/Obstetrics                            Anesthesia Physical Anesthesia Plan  ASA: 3  Anesthesia Plan: General   Post-op Pain Management:    Induction: Intravenous  PONV Risk Score and Plan: 3 and Ondansetron, Dexamethasone and Midazolam  Airway Management Planned: LMA  Additional Equipment:   Intra-op Plan:   Post-operative Plan: Extubation in OR  Informed Consent: I have reviewed the patients History and Physical, chart, labs and discussed the procedure including the risks, benefits and alternatives for the proposed anesthesia with the patient or authorized representative who has indicated his/her understanding and acceptance.     Dental advisory given  Plan Discussed with: CRNA  Anesthesia Plan Comments:         Anesthesia Quick Evaluation

## 2021-10-28 NOTE — Discharge Instructions (Addendum)
Post stent placement instructions   Definitions:  Ureter: The duct that transports urine from the kidney to the bladder. Stent: A plastic hollow tube that is placed into the ureter, from the kidney to the bladder to prevent the ureter from swelling shut.  General instructions:  Despite the fact that no skin incisions were used, the area around the ureter and bladder is raw and irritated. The stent is a foreign body which can further irritate the bladder wall. This irritation is manifested by increased frequency of urination, both day and night, and by an increase in the urge to urinate. In some, the urge to urinate is present almost always. Sometimes the urge is strong enough that you may not be able to stop your self from urinating. This can often be controlled with medication but does not occur in everyone. A stent can safely be left in place for 3 months or greater.  You may see some blood in your urine while the stent is in place and a few days afterward. Do not be alarmed, even if the urine is clear for a while. Get off your feet and drink lots of fluids until clearing occurs. If you start to pass clots or don't improve, call us.  Diet:  You may return to your normal diet immediately. Because of the raw surface of your bladder, alcohol, spicy foods, foods high in acid and drinks with caffeine may cause irritation or frequency and should be used in moderation. To keep your urine flowing freely and avoid constipation, drink plenty of fluids during the day (8-10 glasses). Tip: Avoid cranberry juice because it is very acidic.  Activity:  Your physical activity doesn't need to be restricted. However, if you are very active, you may see some blood in the urine. We suggest that you reduce your activity under the circumstances until the bleeding has stopped.  Bowels:  It is important to keep your bowels regular during the postoperative period. Straining with bowel movements can cause bleeding. A  bowel movement every other day is reasonable. Use a mild laxative if needed, such as milk of magnesia 2-3 tablespoons, or 2 Dulcolax tablets. Call if you continue to have problems. If you had been taking narcotics for pain, before, during or after your surgery, you may be constipated. Take a laxative if necessary.  Medication:  You should resume your pre-surgery medications unless told not to. In addition you may be given an antibiotic to prevent or treat infection. Antibiotics are not always necessary. All medication should be taken as prescribed until the bottles are finished unless you are having an unusual reaction to one of the drugs.  Take daily Cipro 250mg  for next 7 days.  Do not take cephalexin while taking the Cipro.   Problems you should report to [place]:  a. Fever greater than 101F. b. Heavy bleeding, or clots (see notes above about blood in urine). c. Inability to urinate. d. Drug reactions (hives, rash, nausea, vomiting, diarrhea). e. Severe burning or pain with urination that is not improving.    No acetaminophen/Tylenol until after 12:15pm today if needed today for pain.   No ibuprofen, Advil, Aleve, Motrin, ketorolac, meloxicam, naproxen, or other NSAIDS until after 3:25pm today if needed for pain.     Post Anesthesia Home Care Instructions  Activity: Get plenty of rest for the remainder of the day. A responsible individual must stay with you for 24 hours following the procedure.  For the next 24 hours, DO NOT: -Drive a  car -Paediatric nurse -Drink alcoholic beverages -Take any medication unless instructed by your physician -Make any legal decisions or sign important papers.  Meals: Start with liquid foods such as gelatin or soup. Progress to regular foods as tolerated. Avoid greasy, spicy, heavy foods. If nausea and/or vomiting occur, drink only clear liquids until the nausea and/or vomiting subsides. Call your physician if vomiting continues.  Special  Instructions/Symptoms: Your throat may feel dry or sore from the anesthesia or the breathing tube placed in your throat during surgery. If this causes discomfort, gargle with warm salt water. The discomfort should disappear within 24 hours.

## 2021-10-28 NOTE — Op Note (Signed)
Preoperative diagnosis:  Bilateral hydronephrosis   Postoperative diagnosis:  Same   Procedure:  Cystoscopy bilateral ureteral stent xchange (6Fr x 24 cm no tether) bilateral retrograde pyelography with interpretation   Surgeon: Jacalyn Lefevre, MD  Anesthesia: General  Complications: None  Intraoperative findings:   Normal urethra Bilateral orthotopic ureteral orifices with stent seen emanating from them Bladder mucosa normal without masses or irregularity Bilateral retrograde pyelograms demonstrated persistent hydronephrosis or renal pelvis  (L > R)  EBL: Minimal  Specimens: None  Indication: Mary Stark is a 65 y.o. patient with metastatic malignancy causing bilateral ureteral obstruction and hydronephrosis managed with indwelling ureteral stents.  She is here today for stent exchange.  After reviewing the management options for treatment, he elected to proceed with the above surgical procedure(s). We have discussed the potential benefits and risks of the procedure, side effects of the proposed treatment, the likelihood of the patient achieving the goals of the procedure, and any potential problems that might occur during the procedure or recuperation. Informed consent has been obtained.  Description of procedure:  The patient was taken to the operating room and general anesthesia was induced.  The patient was placed in the dorsal lithotomy position, prepped and draped in the usual sterile fashion, and preoperative antibiotics were administered. A preoperative time-out was performed.   Cystourethroscopy was performed.  The patient's urethra was examined and was normal.The bladder was then systematically examined in its entirety. There was no evidence for any bladder tumors, stones, or other mucosal pathology.    Attention then turned to the left ureteral orifice and graspers were used to bring the existing ureteral stent to the urethral meatus.  A sensor wire was then placed  through the ureteral stent and the stent was discarded.  Next an open-ended ureteral catheter was placed over the wire and the wire was removed.  A retrograde pyelogram was then performed with Omnipaque contrast.  Findings are dictated above.   A 0.38 sensor wire was then placed through the ureteral catheter and the catheter was removed.   The wire was then backloaded through the cystoscope and a ureteral stent was advance over the wire using Seldinger technique.  The stent was positioned appropriately under fluoroscopic and cystoscopic guidance.  The wire was then removed with an adequate stent curl noted in the renal pelvis as well as in the bladder.  This entire process was then repeated on the right-hand side.  Shorter stents were used this time and were easily placed.  Hydronephrosis appears stable or even improved.  The bladder was then emptied and the procedure ended.  The patient appeared to tolerate the procedure well and without complications.  The patient was able to be awakened and transferred to the recovery unit in satisfactory condition.   Plan: Patient will have follow-up appointment approximately 4 months to schedule repeat stent exchange.  She will be kept on prophylactic daily antibiotic for the next week given history of urosepsis following stent exchange.  Jacalyn Lefevre, M.D.

## 2021-10-29 ENCOUNTER — Inpatient Hospital Stay: Payer: No Typology Code available for payment source

## 2021-10-29 ENCOUNTER — Inpatient Hospital Stay (HOSPITAL_BASED_OUTPATIENT_CLINIC_OR_DEPARTMENT_OTHER): Payer: No Typology Code available for payment source | Admitting: Oncology

## 2021-10-29 ENCOUNTER — Other Ambulatory Visit (HOSPITAL_COMMUNITY): Payer: Self-pay

## 2021-10-29 ENCOUNTER — Encounter (HOSPITAL_BASED_OUTPATIENT_CLINIC_OR_DEPARTMENT_OTHER): Payer: Self-pay | Admitting: Urology

## 2021-10-29 VITALS — BP 144/82 | HR 85 | Temp 98.0°F | Resp 16 | Ht 66.0 in | Wt 178.2 lb

## 2021-10-29 DIAGNOSIS — C669 Malignant neoplasm of unspecified ureter: Secondary | ICD-10-CM

## 2021-10-29 DIAGNOSIS — Z95828 Presence of other vascular implants and grafts: Secondary | ICD-10-CM

## 2021-10-29 DIAGNOSIS — C801 Malignant (primary) neoplasm, unspecified: Secondary | ICD-10-CM

## 2021-10-29 DIAGNOSIS — Z5111 Encounter for antineoplastic chemotherapy: Secondary | ICD-10-CM | POA: Diagnosis not present

## 2021-10-29 LAB — CBC WITH DIFFERENTIAL (CANCER CENTER ONLY)
Abs Immature Granulocytes: 0.14 10*3/uL — ABNORMAL HIGH (ref 0.00–0.07)
Basophils Absolute: 0 10*3/uL (ref 0.0–0.1)
Basophils Relative: 0 %
Eosinophils Absolute: 0 10*3/uL (ref 0.0–0.5)
Eosinophils Relative: 0 %
HCT: 23.6 % — ABNORMAL LOW (ref 36.0–46.0)
Hemoglobin: 7.9 g/dL — ABNORMAL LOW (ref 12.0–15.0)
Immature Granulocytes: 3 %
Lymphocytes Relative: 27 %
Lymphs Abs: 1.5 10*3/uL (ref 0.7–4.0)
MCH: 37.4 pg — ABNORMAL HIGH (ref 26.0–34.0)
MCHC: 33.5 g/dL (ref 30.0–36.0)
MCV: 111.8 fL — ABNORMAL HIGH (ref 80.0–100.0)
Monocytes Absolute: 1.3 10*3/uL — ABNORMAL HIGH (ref 0.1–1.0)
Monocytes Relative: 24 %
Neutro Abs: 2.5 10*3/uL (ref 1.7–7.7)
Neutrophils Relative %: 46 %
Platelet Count: 56 10*3/uL — ABNORMAL LOW (ref 150–400)
RBC: 2.11 MIL/uL — ABNORMAL LOW (ref 3.87–5.11)
RDW: 20.8 % — ABNORMAL HIGH (ref 11.5–15.5)
WBC Count: 5.5 10*3/uL (ref 4.0–10.5)
nRBC: 0.4 % — ABNORMAL HIGH (ref 0.0–0.2)

## 2021-10-29 LAB — CMP (CANCER CENTER ONLY)
ALT: 12 U/L (ref 0–44)
AST: 21 U/L (ref 15–41)
Albumin: 3.8 g/dL (ref 3.5–5.0)
Alkaline Phosphatase: 106 U/L (ref 38–126)
Anion gap: 9 (ref 5–15)
BUN: 24 mg/dL — ABNORMAL HIGH (ref 8–23)
CO2: 21 mmol/L — ABNORMAL LOW (ref 22–32)
Calcium: 8.7 mg/dL — ABNORMAL LOW (ref 8.9–10.3)
Chloride: 108 mmol/L (ref 98–111)
Creatinine: 1.28 mg/dL — ABNORMAL HIGH (ref 0.44–1.00)
GFR, Estimated: 47 mL/min — ABNORMAL LOW (ref >60)
Glucose, Bld: 93 mg/dL (ref 70–99)
Potassium: 4 mmol/L (ref 3.5–5.1)
Sodium: 138 mmol/L (ref 135–145)
Total Bilirubin: 0.2 mg/dL — ABNORMAL LOW (ref 0.3–1.2)
Total Protein: 7.1 g/dL (ref 6.5–8.1)

## 2021-10-29 LAB — SAMPLE TO BLOOD BANK

## 2021-10-29 MED ORDER — PROCHLORPERAZINE MALEATE 10 MG PO TABS: 10.0000 mg | ORAL_TABLET | Freq: Four times a day (QID) | ORAL | Status: DC | PRN

## 2021-10-29 MED ORDER — HEPARIN SOD (PORK) LOCK FLUSH 100 UNIT/ML IV SOLN: 500.0000 [IU] | Freq: Once | INTRAVENOUS | Status: DC | PRN

## 2021-10-29 MED ORDER — CLONAZEPAM 0.5 MG PO TABS
ORAL_TABLET | Freq: Every day | ORAL | 0 refills | Status: DC
  Filled 2021-10-29: qty 30, 30d supply, fill #0

## 2021-10-29 MED ORDER — SODIUM CHLORIDE 0.9% FLUSH
10.0000 mL | Freq: Once | INTRAVENOUS | Status: AC
  Administered 2021-10-29: 10 mL

## 2021-10-29 MED ORDER — DEXTROSE 5 % IV SOLN: Freq: Once | INTRAVENOUS | Status: AC

## 2021-10-29 MED ORDER — SODIUM CHLORIDE 0.9 % IV SOLN
2400.0000 mg/m2 | INTRAVENOUS | Status: DC
  Administered 2021-10-29: 4800 mg via INTRAVENOUS
  Filled 2021-10-29: qty 96

## 2021-10-29 MED ORDER — SODIUM CHLORIDE 0.9% FLUSH: 10.0000 mL | INTRAVENOUS | Status: DC | PRN

## 2021-10-29 MED ORDER — SODIUM CHLORIDE 0.9 % IV SOLN
400.0000 mg/m2 | Freq: Once | INTRAVENOUS | Status: AC
  Administered 2021-10-29: 804 mg via INTRAVENOUS
  Filled 2021-10-29: qty 40.2

## 2021-10-29 MED ORDER — ONDANSETRON HCL 8 MG PO TABS
8.0000 mg | ORAL_TABLET | Freq: Once | ORAL | Status: AC
  Administered 2021-10-29: 8 mg via ORAL
  Filled 2021-10-29: qty 1

## 2021-10-29 MED ORDER — FLUOROURACIL CHEMO INJECTION 2.5 GM/50ML
400.0000 mg/m2 | Freq: Once | INTRAVENOUS | Status: AC
  Administered 2021-10-29: 800 mg via INTRAVENOUS
  Filled 2021-10-29: qty 16

## 2021-10-29 NOTE — Patient Instructions (Signed)
[facility]  Discharge Instructions: Thank you for choosing [facility] to provide your oncology and hematology care.   If you have a lab appointment with the [facility], please go directly to the [facility] and check in at the registration area.   Wear comfortable clothing and clothing appropriate for easy access to any Portacath or PICC line.   We strive to give you quality time with your provider. You may need to reschedule your appointment if you arrive late (15 or more minutes).  Arriving late affects you and other patients whose appointments are after yours.  Also, if you miss three or more appointments without notifying the office, you may be dismissed from the clinic at the provider's discretion.      For prescription refill requests, have your pharmacy contact our office and allow 72 hours for refills to be completed.    Today you received the following chemotherapy and/or immunotherapy agents: Leucovorin/ 5FU      To help prevent nausea and vomiting after your treatment, we encourage you to take your nausea medication as directed.  BELOW ARE SYMPTOMS THAT SHOULD BE REPORTED IMMEDIATELY: *FEVER GREATER THAN 100.4 F (38 C) OR HIGHER *CHILLS OR SWEATING *NAUSEA AND VOMITING THAT IS NOT CONTROLLED WITH YOUR NAUSEA MEDICATION *UNUSUAL SHORTNESS OF BREATH *UNUSUAL BRUISING OR BLEEDING *URINARY PROBLEMS (pain or burning when urinating, or frequent urination) *BOWEL PROBLEMS (unusual diarrhea, constipation, pain near the anus) TENDERNESS IN MOUTH AND THROAT WITH OR WITHOUT PRESENCE OF ULCERS (sore throat, sores in mouth, or a toothache) UNUSUAL RASH, SWELLING OR PAIN  UNUSUAL VAGINAL DISCHARGE OR ITCHING   Items with * indicate a potential emergency and should be followed up as soon as possible or go to the Emergency Department if any problems should occur.  Please show the CHEMOTHERAPY ALERT CARD or IMMUNOTHERAPY ALERT CARD at  check-in to the Emergency Department and triage nurse.  Should you have questions after your visit or need to cancel or reschedule your appointment, please contact [facility]  Dept: [phone]  and follow the prompts.  Office hours are 8:00 a.m. to 4:30 p.m. Monday - Friday. Please note that voicemails left after 4:00 p.m. may not be returned until the following business day.  We are closed weekends and major holidays. You have access to a nurse at all times for urgent questions. Please call the main number to the clinic Dept: [phone] and follow the prompts.   For any non-urgent questions, you may also contact your provider using MyChart. We now offer e-Visits for anyone 58 and older to request care online for non-urgent symptoms. For details visit [email or web address].   Also download the MyChart app! Go to the app store, search "MyChart", open the app, select [facility], and log in with your MyChart username and password.  Due to Covid, a mask is required upon entering the hospital/clinic. If you do not have a mask, one will be given to you upon arrival. For doctor visits, patients may have 1 support person aged 28 or older with them. For treatment visits, patients cannot have anyone with them due to current Covid guidelines and our immunocompromised population.

## 2021-10-29 NOTE — Progress Notes (Signed)
Patient reports she does not tolerate compazine well and prefers zofran PO instead. Dr. Alen Blew notified and verbal order for zofran 8mg  po given.  Per Dr. Hazeline Junker note from today, proceed with leucovorin and 5FU push and infusion and discontinue oxaliplatin.  Patient requested covid booster, Dr. Alen Blew notified. Per Bethena Roys, RN, schedule message was sent for patient to receive covid booster after her pump D/C appointment on Friday.

## 2021-10-29 NOTE — Progress Notes (Signed)
Denies anyHematology and Oncology Follow Up Visit  Mary Stark [id number] 08-Jul-1956 65 y.o. 10/29/2021 8:47 AM Mary Stark, DOAlexander, [place], DO   [name]: 65 year old woman with bladder cancer diagnosed in November 2021.  She presented with stage IV adenocarcinoma and pelvic lymphadenopathy.  Prior Therapy:  She is status post retroperitoneal lymph node biopsy completed on 10/24/2020 which showed metastatic carcinoma.  She is status post repeat cystoscopy and bilateral ureteral stent exchange as well as resection of a bladder tumor on January 21, 2021.  A final pathology showed an adenocarcinoma.  Chemotherapy utilizing carboplatin and gemcitabine started on November 22, 2020.  She completed 6 cycles of therapy on March 06, 2021.  FOLFOX chemotherapy started on Apr 15, 2021.  She completed 12 cycles of therapy.  Current therapy: Under evaluation for maintenance 5-FU leucovorin.   Interim History: Ms. Mary Stark is here for a follow-up visit.  Since the last visit, she reports no major changes in her health.  She has reported some mild fatigue and tiredness but no hospitalizations or illnesses.  She underwent cystoscopy and bilateral stent replacement by Dr. Claudia Stark which was successful at this time.  She denies any hematuria or dysuria.  Her performance status and quality of life remained excellent.     Medications: Reviewed without changes. Current Outpatient Medications  Medication Sig Dispense Refill   acetaminophen (TYLENOL) 500 MG tablet Take 625 mg by mouth every 8 (eight) hours as needed for moderate pain.     albuterol (PROAIR HFA) 108 (90 Base) MCG/ACT inhaler INHALE 1 TO 2 PUFFS BY MOUTH INTO THE LUNGS EVERY 4 HOURS AS NEEDED FOR WHEEZING OR SHORTNESS OF BREATH 8 g 1   amLODipine (NORVASC) 5 MG tablet Take 5 mg by mouth as needed.     ARIPiprazole (ABILIFY) 10 MG tablet TAKE 1 TABLET BY MOUTH ONCE DAILY (Patient taking differently: Take 10 mg by mouth  daily.) 90 tablet 0   ascorbic acid (VITAMIN C) 500 MG tablet Take 1,000 mg by mouth every evening.      benzonatate (TESSALON) 200 MG capsule Take 1 capsule (200 mg total) by mouth 3 (three) times daily as needed for cough. 30 capsule 0   buPROPion (WELLBUTRIN XL) 300 MG 24 hr tablet TAKE 1 TABLET BY MOUTH EVERY DAY (Patient taking differently: Take 300 mg by mouth daily.) 30 tablet 3   cephALEXin (KEFLEX) 250 MG capsule TAKE ONE CAPSULE BY MOUTH NIGHTLY (Patient taking differently: Take 250 mg by mouth at bedtime.) 90 capsule 1   Cholecalciferol (VITAMIN D) 125 MCG (5000 UT) CAPS Take 5,000 Units by mouth every evening.      ciprofloxacin (CIPRO) 250 MG tablet Take 1 tablet by mouth daily for 7 days. 20 tablet 0   clonazePAM (KLONOPIN) 0.5 MG tablet TAKE 1 TABLET BY MOUTH AT BEDTIME (Patient taking differently: Take 0.5 mg by mouth at bedtime.) 30 tablet 0   Coenzyme Q10 (CO Q-10) 200 MG CAPS Take 200 mg by mouth every evening.      fluticasone-salmeterol (ADVAIR HFA) 115-21 MCG/ACT inhaler Inhale 2 puffs into the lungs 2 (two) times daily. (Patient taking differently: Inhale 2 puffs into the lungs 2 (two) times daily. Havent had it filled) 1 each 0   gabapentin (NEURONTIN) 300 MG capsule TAKE 1 CAPSULE BY MOUTH IN THE MORNING, 1 CAPSULE IN THE AFTERNOON & 4 CAPSULES AT NIGHT (Patient taking differently: Take 300 mg by mouth 4 (four) times daily.) 180 capsule 3   GLUCOSAMINE-CHONDROITIN PO Take 2  tablets by mouth every evening.      lidocaine-prilocaine (EMLA) cream Apply 1 application topically as needed. 30 g 0   Meth-Hyo-M Bl-Na Phos-Ph Sal (URIBEL) 118 MG CAPS Take one capsule by mouth 3 times daily (Patient taking differently: as needed.) 30 capsule 3   mirabegron ER (MYRBETRIQ) 25 MG TB24 tablet TAKE 1 TABLET BY MOUTH ONCE A DAY 30 tablet 3   Nebivolol HCl 20 MG TABS Take 1/2 tablet (10 mg total) by mouth daily. 90 tablet 1   Omega-3 Fatty Acids (FISH OIL) 1200 MG CAPS Take 1,200 mg by mouth  every evening.      omeprazole (PRILOSEC) 20 MG capsule Take 1 capsule (20 mg total) by mouth daily. 90 capsule 3   ondansetron (ZOFRAN) 4 MG tablet Take 1 tablet (4 mg total) by mouth every 8 (eight) hours as needed for nausea or vomiting. 40 tablet 3   oxybutynin (DITROPAN) 5 MG tablet TAKE 1 TABLET BY MOUTH EVERY 8 HOURS AS NEEDED 90 tablet 3   oxymetazoline (AFRIN) 0.05 % nasal spray Place 1 spray into both nostrils 2 (two) times daily as needed for congestion. For nose bleeds     Phenazopyridine HCl (AZO URINARY PAIN PO) Take 2 tablets by mouth 3 (three) times daily as needed (bladder spasms).     simvastatin (ZOCOR) 20 MG tablet TAKE 1 TABLET (20 MG TOTAL) BY MOUTH DAILY. (Patient taking differently: Take 20 mg by mouth at bedtime.) 90 tablet 3   tamsulosin (FLOMAX) 0.4 MG CAPS capsule TAKE 1 CAPSULE BY MOUTH AT BEDTIME 30 capsule 3   thiamine 100 MG tablet Take 1 tablet (100 mg total) by mouth daily. 30 tablet 0   traMADol (ULTRAM) 50 MG tablet Take 1 tablet (50 mg total) by mouth 3 (three) times daily as needed. 30 tablet 3   vitamin B-12 (CYANOCOBALAMIN) 1000 MCG tablet Take 2,000 mcg by mouth every evening.      Vitamin E 180 MG (400 UNIT) CAPS Take 400 Units by mouth every evening.      No current facility-administered medications for this visit.   Facility-Administered Medications Ordered in Other Visits  Medication Dose Route Frequency Provider Last Rate Last Admin   sodium chloride flush (NS) 0.9 % injection 10 mL  10 mL Intracatheter Once Wyatt Portela, MD         Allergies:  Allergies  Allergen Reactions   Metformin And Related Other (See Comments)    Lactic acidosis    Penicillins Hives and Rash    Reports hives to penicillin and amoxicillin as an adult "a long time ago." Has tolerated cefdinir (09/2020) and cefepime (10/2020) before.   Metformin     NDC Code:00093721201 NDC HWEX:[id number]   Penicillin G Sodium     NDC YBOF:[id number] NDC OEUM:[id number] NDC  MGQQ:[id number]      Physical Exam:  Blood pressure (!) 144/82, pulse 85, temperature 98 F (36.7 C), temperature source Temporal, resp. rate 16, height 5\' 6"  (1.676 m), weight 178 lb 3.2 oz (80.8 kg), SpO2 99 %.   ECOG: 1   General appearance: Alert, awake without any distress. Head: Atraumatic without abnormalities Oropharynx: Without any thrush or ulcers. Eyes: No scleral icterus. Lymph nodes: No lymphadenopathy noted in the cervical, supraclavicular, or axillary nodes Heart:regular rate and rhythm, without any murmurs or gallops.   Lung: Clear to auscultation without any rhonchi, wheezes or dullness to percussion. Abdomin: Soft, nontender without any shifting dullness or ascites. Musculoskeletal: No clubbing or cyanosis. Neurological:  No motor or sensory deficits. Skin: No rashes or lesions.                 Lab Results: Lab Results  Component Value Date   WBC 6.3 10/16/2021   HGB 9.2 (L) 10/28/2021   HCT 27.0 (L) 10/28/2021   MCV 110.4 (H) 10/16/2021   PLT 113 (L) 10/16/2021     Chemistry      Component Value Date/Time   NA 140 10/28/2021 0753   NA 142 04/26/2018 1333   K 4.2 10/28/2021 0753   CL 106 10/28/2021 0753   CO2 20 (L) 10/16/2021 0917   BUN 23 10/28/2021 0753   BUN 22 04/26/2018 1333   CREATININE 1.60 (H) 10/28/2021 0753   CREATININE 1.04 (H) 10/16/2021 0917   CREATININE 1.32 (H) 06/04/2021 0000   GLU 87 02/07/2013 0000      Component Value Date/Time   CALCIUM 8.9 10/16/2021 0917   ALKPHOS 124 10/16/2021 0917   AST 25 10/16/2021 0917   ALT 12 10/16/2021 0917   BILITOT 0.4 10/16/2021 0917      IMPRESSION: 1. Stable bilateral double-J ureteral stents in good position without complicating features. Persistent significant left and moderate right hydronephrosis. 2. Stable small scattered treated retroperitoneal lymph nodes. No change since the most recent prior CT. No new or progressive findings. 3. No findings suspicious for  residual or recurrent bladder mass. 4. Chronic lung changes, likely interstitial lung disease. 5. Aortic atherosclerosis.   Aortic Atherosclerosis (ICD10-I70.0).   Impression and Plan:    65 year old woman with:   1.   Stage IV bladder cancer diagnosed in 2021.  She was found to have adenocarcinoma with pelvic lymphadenopathy.  The natural course of this disease was reviewed at this time and imaging studies from a November 10 were personally reviewed.  She continues to have reasonable response to therapy with very little residual disease and pelvic adenopathy.  Risks and benefits of proceeding with maintenance 5-FU leucovorin versus therapy discontinuation were discussed.  After discussion today, she is agreeable to continue with that 5-FU leucovorin maintenance without oxaliplatin.  Laboratory data from today showed thrombocytopenia but adequate hematological parameters.    2.  IV access: Port-A-Cath currently in use without any issues.   3.  Antiemetics: No nausea or vomiting reported at this time.   4.  Renal function surveillance: She has chronic renal insufficiency related to hydronephrosis with stent exchange by Dr. Claudia Stark on October 28, 2021.  We will continue to monitor kidney function at this time.   5.  Goals of care: Therapy remains palliative although aggressive measures are warranted at this time.   6.  Anemia: Related to malignancy and chemotherapy.  Hemoglobin is 7.9 and asymptomatic.  We will continue to monitor and transfuse as needed.  7.  Neutropenia: She will not require any growth factor support presented with 5-FU leucovorin maintenance.  8.  Thrombocytopenia: Due to chemotherapy which will likely improve with oxaliplatin discontinuation.  No active bleeding noted at this time.   9.  Follow-up: She will return in 10 days for repeat laboratory testing and possible transfusion.   30  minutes were dedicated to this encounter.  Time spent on reviewing imaging  studies, discussing treatment choices and addressing complication related to cancer and cancer therapy.   Zola Button, MD 11/16/20228:47 AM

## 2021-10-29 NOTE — Telephone Encounter (Signed)
Forwarding to covering provider.

## 2021-10-31 ENCOUNTER — Other Ambulatory Visit: Payer: Self-pay

## 2021-10-31 ENCOUNTER — Inpatient Hospital Stay (HOSPITAL_BASED_OUTPATIENT_CLINIC_OR_DEPARTMENT_OTHER): Payer: No Typology Code available for payment source

## 2021-10-31 ENCOUNTER — Inpatient Hospital Stay: Payer: No Typology Code available for payment source

## 2021-10-31 ENCOUNTER — Telehealth: Payer: Self-pay | Admitting: Oncology

## 2021-10-31 VITALS — BP 166/80 | HR 80 | Resp 20

## 2021-10-31 DIAGNOSIS — Z5111 Encounter for antineoplastic chemotherapy: Secondary | ICD-10-CM | POA: Diagnosis not present

## 2021-10-31 DIAGNOSIS — Z23 Encounter for immunization: Secondary | ICD-10-CM

## 2021-10-31 DIAGNOSIS — C801 Malignant (primary) neoplasm, unspecified: Secondary | ICD-10-CM

## 2021-10-31 DIAGNOSIS — C669 Malignant neoplasm of unspecified ureter: Secondary | ICD-10-CM

## 2021-10-31 MED ORDER — SODIUM CHLORIDE 0.9% FLUSH
10.0000 mL | INTRAVENOUS | Status: DC | PRN
  Administered 2021-10-31: 10 mL

## 2021-10-31 MED ORDER — HEPARIN SOD (PORK) LOCK FLUSH 100 UNIT/ML IV SOLN
500.0000 [IU] | Freq: Once | INTRAVENOUS | Status: AC | PRN
  Administered 2021-10-31: 500 [IU]

## 2021-10-31 NOTE — Telephone Encounter (Signed)
Scheduled per 11/16 los, patient has been called and notified.  

## 2021-11-01 ENCOUNTER — Emergency Department
Admission: EM | Admit: 2021-11-01 | Discharge: 2021-11-01 | Disposition: A | Discharge status: Home / Self Care | Payer: No Typology Code available for payment source | Source: Home / Self Care

## 2021-11-01 DIAGNOSIS — R52 Pain, unspecified: Secondary | ICD-10-CM

## 2021-11-01 DIAGNOSIS — R350 Frequency of micturition: Secondary | ICD-10-CM | POA: Diagnosis not present

## 2021-11-01 DIAGNOSIS — N3001 Acute cystitis with hematuria: Secondary | ICD-10-CM

## 2021-11-01 LAB — POCT URINALYSIS DIP (MANUAL ENTRY)
Glucose, UA: 100 mg/dL — AB
Nitrite, UA: POSITIVE — AB
Protein Ur, POC: >=300 mg/dL — AB
Spec Grav, UA: 1.015 (ref 1.010–1.025)
Urobilinogen, UA: 2 E.U./dL — AB
pH, UA: 6 (ref 5.0–8.0)

## 2021-11-01 LAB — POCT INFLUENZA A/B
Influenza A, POC: NEGATIVE
Influenza B, POC: NEGATIVE

## 2021-11-01 MED ORDER — OSELTAMIVIR PHOSPHATE 75 MG PO CAPS: 75.0000 mg | ORAL_CAPSULE | Freq: Two times a day (BID) | ORAL | 0 refills | Status: DC

## 2021-11-01 NOTE — ED Triage Notes (Signed)
Pt presents to Urgent Care with c/o fever and body aches since last night. Reports cough for approx one month. Pt also having urinary frequency this morning and reports having renal stents "changed" 4 days ago and is concerned she is septic. States she is taking Cipro daily. Pt also reports being exposed to the flu.

## 2021-11-01 NOTE — ED Provider Notes (Signed)
[name]    CSN: [id number] Arrival date & time: 11/01/21  0807      History   Chief Complaint Chief Complaint  Patient presents with   Urinary Frequency   Fever   Generalized Body Aches    HPI Mary Stark is a 65 y.o. female.   HPI 65 year old female presents with fever and body aches since last night and was recently exposed to flu.  Patient also reports having urinary frequency this morning and recently having renal stents changed 4 days ago and is concerned she is septic.  Patient is currently on Cipro by her Urologist.  PMH significant for adenocarcinoma of bladder stage IV, CKD stage III, UTI, and sepsis.  Past Medical History:  Diagnosis Date   Adenocarcinoma of bladder, stage 4 ([place]) 10/2020   oncologist--- dr Alen Blew and urologist-- dr pace;  w/ mets , started chemo 12/ 2021 and bilateral ureteral stents to treat hydronephrosis   Alcohol dependence ([place])    drank heavily years ago doesn't now 10/22/2021   Anemia due to chemotherapy    Anxiety    Arthritis    Back and lt knee   CKD (chronic kidney disease), stage III [facility])    COVID    August 2021, was in hospital and was on Oxygen for 11 weeks lungs still look like glass. 10/22/2021   Family history of adverse reaction to anesthesia    father had a "tight" airway   GERD (gastroesophageal reflux disease)    History of 2019 novel coronavirus disease (COVID-19) 08/13/2020   positive result in epic 08-19-2020,  hospital admission in epic,  covid pneumonia with hypoxia respiratory failure, discharged with oxygen for 6 weeks   History of chemotherapy last done 05-13-2021   History of colitis 03/2018   acute colitis with sepsis   History of hepatitis C    TX FOR 2012   History of kidney stones    History of rheumatic fever as a child    per pt no valvular issues   History of sepsis 10/2020   hospital admission in epic due to pyelonephritis due to hydronephrosis, resolved   History of skin cancer     excision from nose   Hyperlipidemia    Hypertension    MDD (major depressive disorder)    Pneumonia    august 2022   Seasonal allergies    Sepsis ([place])    uro sepsis June 2022   Vitamin D deficiency     Patient Active Problem List   Diagnosis Date Noted   Chronic pain of right knee 09/15/2021   Encounter for antineoplastic chemotherapy 07/09/2021   UTI (urinary tract infection) 05/23/2021   Right lower quadrant pain 01/01/2021   Incontinence of feces 01/01/2021   Abnormal findings on diagnostic imaging of other abdominal regions, including retroperitoneum 01/01/2021   Change in bowel habit 01/01/2021   Hemorrhoids without complication 95/08/3266   Impingement syndrome, shoulder, left 11/27/2020   Port-A-Cath in place 11/22/2020   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   Malignancy ([place]) 10/31/2020   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   Sepsis secondary to UTI ([place]) 10/22/2020   Normocytic anemia 10/22/2020   Hyperproteinemia 10/22/2020   Hydronephrosis 10/01/2020   Proteinuria 10/01/2020   Abnormal CT of the abdomen 10/01/2020   Bilateral lower extremity edema 09/12/2020   Acute hypoxemic respiratory failure due to COVID-19 ([place]) 08/19/2020   Tear of medial meniscus of left knee, current 07/13/2019   Internal derangement of knee involving posterior  horn of lateral meniscus, left 07/13/2019   Primary osteoarthritis of both knees 07/13/2019   Renal insufficiency 07/13/2019   Anxiety about health 03/22/2019   Hypokalemia 03/21/2019   Essential hypertension with goal blood pressure less than 130/80 02/13/2019   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/2018   Anemia due to blood loss, acute 04/14/2018   Hypomagnesemia 04/14/2018   Hypocalcemia 04/14/2018   Tobacco abuse 04/08/2018   Depression 04/07/2018   AKI (acute kidney injury) ([place]) 04/07/2018   Sepsis ([place]) 04/07/2018   Acute colitis 04/07/2018   Avascular necrosis of humeral head, right ([place]) 03/30/2018   NSAID long-term use  03/30/2018   Goals of care, counseling/discussion 03/30/2018   Seborrheic keratoses 03/15/2018   History of nonmelanoma skin cancer 03/02/2018   Skin lesion of chest wall 03/02/2018   Alcohol dependence with unspecified alcohol-induced disorder ([place]) 02/11/2018   Severe episode of recurrent major depressive disorder, without psychotic features ([place]) 02/11/2018   Transaminitis 01/06/2018   Nicotine dependence 01/06/2018   Heavy alcohol consumption 01/06/2018   Encounter for monitoring statin therapy 01/06/2018   Class 1 obesity due to excess calories with serious comorbidity in adult 01/06/2018   Chronic pain syndrome 04/21/2017   Chronic right shoulder pain 07/03/2015   Vaginismus 04/12/2014   Menopausal state 11/08/2013   Family history of ovarian cancer 09/27/2013   Postmenopausal atrophic vaginitis 09/12/2013   Personal history of colonic polyps 07/18/2013   Dyslipidemia, goal LDL below 100 07/17/2013   Depression with anxiety 07/17/2013   Hx of hepatitis C 07/17/2013   Vitamin D deficiency 07/17/2013   Right lumbar radiculopathy 07/17/2013   History of alcohol dependence ([place]) 07/17/2013   H/O: rheumatic fever 06/30/2012   Insomnia 06/30/2012   Personal history of other infectious and parasitic diseases 06/30/2012    Past Surgical History:  Procedure Laterality Date   ANAL RECTAL MANOMETRY N/A 01/31/2020   Procedure: ANO RECTAL MANOMETRY;  Surgeon: Arta Silence, MD;  Location: [facility];  Service: Endoscopy;  Laterality: N/A;   CESAREAN SECTION  1984   CYSTOSCOPY W/ URETERAL STENT PLACEMENT Bilateral 01/21/2021   Procedure: CYSTOSCOPY WITH STENT REPLACEMENT;  Surgeon: Robley Fries, MD;  Location: [facility];  Service: Urology;  Laterality: Bilateral;  1 HR   CYSTOSCOPY W/ URETERAL STENT PLACEMENT Bilateral 05/20/2021   Procedure: CYSTOSCOPY WITH RETROGRADE PYELOGRAM/URETERAL STENT EXCHANGE;  Surgeon: Robley Fries, MD;  Location: [facility];  Service: Urology;  Laterality: Bilateral;   CYSTOSCOPY W/ URETERAL STENT PLACEMENT Bilateral 10/28/2021   Procedure: CYSTOSCOPY WITH RETROGRADE PYELOGRAM/URETERAL STENT EXCHANGE;  Surgeon: Robley Fries, MD;  Location: [facility];  Service: Urology;  Laterality: Bilateral;   CYSTOSCOPY WITH RETROGRADE PYELOGRAM, URETEROSCOPY AND STENT PLACEMENT Bilateral 06/11/2020   Procedure: CYSTOSCOPY WITH RETROGRADE PYELOGRAM, URETEROSCOPY AND STENT PLACEMENT, RIGHT URETERAL BIOPSY;  Surgeon: Robley Fries, MD;  Location: [facility];  Service: Urology;  Laterality: Bilateral;  64 MINS   CYSTOSCOPY WITH RETROGRADE PYELOGRAM, URETEROSCOPY AND STENT PLACEMENT Bilateral 07/05/2020   Procedure: CYSTOSCOPY WITH RETROGRADE PYELOGRAM, URETEROSCOPY,  BIOPSIES AND STENT EXCHANGES;  Surgeon: Robley Fries, MD;  Location[facility];  Service: Urology;  Laterality: Bilateral;   FINGER SURGERY Right    5th   GYNECOLOGIC CRYOSURGERY  YRS AGO   IR IMAGING GUIDED PORT INSERTION  11/18/2020   TONSILLECTOMY  AS CHILD   TRANSURETHRAL RESECTION OF BLADDER TUMOR  01/21/2021   Procedure: TRANSURETHRAL RESECTION OF BLADDER TUMOR (TURBT);  Surgeon: Robley Fries, MD;  Location: [place];  Service: Urology;;    OB History   No obstetric history on file.      Home Medications    Prior to Admission medications   Medication Sig Start Date End Date Taking? Authorizing Provider  oseltamivir (TAMIFLU) 75 MG capsule Take 1 capsule (75 mg total) by mouth every 12 (twelve) hours. 11/01/21  Yes Eliezer Lofts, FNP  acetaminophen (TYLENOL) 500 MG tablet Take 625 mg by mouth every 8 (eight) hours as needed for moderate pain.    [provider]  albuterol (PROAIR HFA) 108 (90 Base) MCG/ACT inhaler INHALE 1 TO 2 PUFFS BY MOUTH INTO THE LUNGS EVERY 4 HOURS AS NEEDED FOR WHEEZING OR SHORTNESS OF BREATH 10/21/21   Hali Marry, MD  amLODipine (NORVASC) 5 MG  tablet Take 5 mg by mouth as needed.    [provider]  ARIPiprazole (ABILIFY) 10 MG tablet TAKE 1 TABLET BY MOUTH ONCE DAILY Patient taking differently: Take 10 mg by mouth daily. 09/18/21 09/18/22  Donella Stade, PA-C  ascorbic acid (VITAMIN C) 500 MG tablet Take 1,000 mg by mouth every evening.     [provider]  benzonatate (TESSALON) 200 MG capsule Take 1 capsule (200 mg total) by mouth 3 (three) times daily as needed for cough. 10/21/21   Hali Marry, MD  buPROPion (WELLBUTRIN XL) 300 MG 24 hr tablet TAKE 1 TABLET BY MOUTH EVERY DAY Patient taking differently: Take 300 mg by mouth daily. 08/26/21 08/26/22  [name], DO  cephALEXin (KEFLEX) 250 MG capsule TAKE ONE CAPSULE BY MOUTH NIGHTLY Patient not taking: Reported on 10/29/2021 06/06/21     Cholecalciferol (VITAMIN D) 125 MCG (5000 UT) CAPS Take 5,000 Units by mouth every evening.     [provider]  ciprofloxacin (CIPRO) 250 MG tablet Take 1 tablet by mouth daily for 7 days. 10/28/21 11/17/21  Robley Fries, MD  clonazePAM (KLONOPIN) 0.5 MG tablet TAKE 1 TABLET BY MOUTH AT BEDTIME 10/29/21 04/27/22  Breeback, Jade L, PA-C  Coenzyme Q10 (CO Q-10) 200 MG CAPS Take 200 mg by mouth every evening.     [provider]  fluticasone-salmeterol (ADVAIR HFA) 115-21 MCG/ACT inhaler Inhale 2 puffs into the lungs 2 (two) times daily. Patient taking differently: Inhale 2 puffs into the lungs 2 (two) times daily. Havent had it filled 08/13/21   [name], DO  gabapentin (NEURONTIN) 300 MG capsule TAKE 1 CAPSULE BY MOUTH IN THE MORNING, 1 CAPSULE IN THE AFTERNOON & 4 CAPSULES AT NIGHT Patient taking differently: Take 300 mg by mouth 4 (four) times daily. 09/25/21 09/25/22  Luetta Nutting, DO  GLUCOSAMINE-CHONDROITIN PO Take 2 tablets by mouth every evening.     [provider]  lidocaine-prilocaine (EMLA) cream Apply 1 application topically as needed. 10/16/21   Wyatt Portela, MD   Meth-Hyo-M Bl-Na Phos-Ph Sal (URIBEL) 118 MG CAPS Take one capsule by mouth 3 times daily Patient taking differently: as needed. 06/06/21     mirabegron ER (MYRBETRIQ) 25 MG TB24 tablet TAKE 1 TABLET BY MOUTH ONCE A DAY 09/15/21 09/15/22    Nebivolol HCl 20 MG TABS Take 1/2 tablet (10 mg total) by mouth daily. 09/15/21 09/15/22  [name], NP  Omega-3 Fatty Acids (FISH OIL) 1200 MG CAPS Take 1,200 mg by mouth every evening.     [provider]  omeprazole (PRILOSEC) 20 MG capsule Take 1 capsule (20 mg total) by mouth daily. 06/23/21 06/23/22  [name], DO  ondansetron (ZOFRAN) 4 MG tablet Take 1 tablet (4 mg total) by mouth every 8 (eight) hours as needed for nausea or vomiting. 04/30/21   Wyatt Portela, MD  oxybutynin (DITROPAN) 5 MG tablet TAKE 1 TABLET BY MOUTH EVERY 8 HOURS AS NEEDED 05/20/21 05/20/22    oxymetazoline (AFRIN) 0.05 % nasal spray Place 1 spray into both nostrils 2 (two) times daily as needed for congestion. For nose bleeds    [provider]  Phenazopyridine HCl (AZO URINARY PAIN PO) Take 2 tablets by mouth 3 (three) times daily as needed (bladder spasms).    [provider]  simvastatin (ZOCOR) 20 MG tablet TAKE 1 TABLET (20 MG TOTAL) BY MOUTH DAILY. Patient taking differently: Take 20 mg by mouth at bedtime. 07/08/21 07/08/22  [name], DO  tamsulosin (FLOMAX) 0.4 MG CAPS capsule TAKE 1 CAPSULE BY MOUTH AT BEDTIME 09/15/21 09/15/22    thiamine 100 MG tablet Take 1 tablet (100 mg total) by mouth daily. 10/26/20   Bonnielee Haff, MD  traMADol (ULTRAM) 50 MG tablet Take 1 tablet (50 mg total) by mouth 3 (three) times daily as needed. 10/06/21   Silverio Decamp, MD  vitamin B-12 (CYANOCOBALAMIN) 1000 MCG tablet Take 2,000 mcg by mouth every evening.     [provider]  Vitamin E 180 MG (400 UNIT) CAPS Take 400 Units by mouth every evening.     [provider]    Family History Family History  Problem Relation Age of  Onset   Ovarian cancer Mother    Cardiomyopathy Father    Valvular heart disease Father    Liver cancer Brother    Protein C deficiency Brother    Protein C deficiency Brother    Stroke Brother     Social History Social History   Tobacco Use   Smoking status: Former    Packs/day: 1.00    Years: 25.00    Pack years: 25.00    Types: Cigarettes    Quit date: 2012    Years since quitting: 10.8   Smokeless tobacco: Never  Vaping Use   Vaping Use: Every day   Substances: Nicotine  Substance Use Topics   Alcohol use: Yes    Comment: occ wine   Drug use: No     Allergies   Metformin and related, Penicillins, Metformin, and Penicillin g sodium   Review of Systems Review of Systems  Genitourinary:  Positive for frequency and urgency.  Musculoskeletal:  Positive for myalgias.  All other systems reviewed and are negative.   Physical Exam Triage Vital Signs ED Triage Vitals [11/01/21 0832]  Enc Vitals Group     BP      Pulse      Resp      Temp      Temp src      SpO2      Weight 175 lb (79.4 kg)     Height 5\' 6"  (1.676 m)     Head Circumference      Peak Flow      Pain Score 3     Pain Loc      Pain Edu?      Excl. in [place]?    No data found.  Updated Vital Signs BP 118/76   Pulse 91   Temp 98.3 F (36.8 C) (Oral)   Resp 20   Ht 5\' 6"  (1.676 m)   Wt 175 lb (79.4 kg)   SpO2 96%   BMI 28.25 kg/m  Physical Exam Vitals and nursing note reviewed.  Constitutional:      General: She is not in acute distress.    Appearance: Normal appearance. She is normal weight. She is ill-appearing.  HENT:     Head: Normocephalic and atraumatic.     Right Ear: Tympanic membrane, ear canal and external ear normal.     Left Ear: Tympanic membrane, ear canal and external ear normal.     Mouth/Throat:     Mouth: Mucous membranes are moist.     Pharynx: Oropharynx is clear.  Eyes:     Extraocular Movements: Extraocular movements intact.     Conjunctiva/sclera:  Conjunctivae normal.     Pupils: Pupils are equal, round, and reactive to light.  Cardiovascular:     Rate and Rhythm: Normal rate and regular rhythm.     Pulses: Normal pulses.     Heart sounds: Normal heart sounds.  Pulmonary:     Effort: Pulmonary effort is normal.     Breath sounds: Normal breath sounds.  Abdominal:     Tenderness: There is no right CVA tenderness or left CVA tenderness.  Musculoskeletal:     Cervical back: Normal range of motion and neck supple.  Skin:    General: Skin is warm and dry.  Neurological:     General: No focal deficit present.     Mental Status: She is alert and oriented to person, place, and time.     UC Treatments / Results  Labs (all labs ordered are listed, but only abnormal results are displayed) Labs Reviewed  POCT URINALYSIS DIP (MANUAL ENTRY) - Abnormal; Notable for the following components:      Result Value   Color, UA orange (*)    Glucose, UA =100 (*)    Bilirubin, UA small (*)    Ketones, POC UA trace (5) (*)    Blood, UA small (*)    Protein Ur, POC >=300 (*)    Urobilinogen, UA 2.0 (*)    Nitrite, UA Positive (*)    Leukocytes, UA Large (3+) (*)    All other components within normal limits  URINE CULTURE  POCT INFLUENZA A/B    EKG   Radiology No results found.  Procedures Procedures (including critical care time)  Medications Ordered in UC Medications - No data to display  Initial Impression / Assessment and Plan / UC Course  I have reviewed the triage vital signs and the nursing notes.  Pertinent labs & imaging results that were available during my care of the patient were reviewed by me and considered in my medical decision making (see chart for details).     MDM: 1.  Generalized body aches-influenza was negative this morning; however, we will treat her empirically for influenza-like illness with Tamiflu; 2.  Acute cystitis with hematuria-patient is currently on Cipro we have ordered urine culture and we  will follow-up with her once returned.  Advised patient to take medication as directed with food to completion.  Encouraged patient to increase daily water intake while taking this medication.  Final Clinical Impressions(s) / UC Diagnoses   Final diagnoses:  Generalized body aches  Urinary frequency  Acute cystitis with hematuria     Discharge Instructions      Advised/instructed patient that she is positive for urinary tract infection and that we will follow-up with her once urinary culture has returned.  Advised patient influenza was negative this morning; however, we will treat her empirically for influenza-like illness with Tamiflu. Advised  patient to take medication as directed with food to completion.  Encouraged patient to increase daily water intake while taking this medication.     ED Prescriptions     Medication Sig Dispense Auth. Provider   oseltamivir (TAMIFLU) 75 MG capsule Take 1 capsule (75 mg total) by mouth every 12 (twelve) hours. 10 capsule Eliezer Lofts, FNP      PDMP not reviewed this encounter.   Eliezer Lofts, [place] 11/01/21 [phone]

## 2021-11-01 NOTE — Discharge Instructions (Addendum)
Advised/instructed patient that she is positive for urinary tract infection and that we will follow-up with her once urinary culture has returned.  Advised patient influenza was negative this morning; however, we will treat her empirically for influenza-like illness with Tamiflu. Advised patient to take medication as directed with food to completion.  Encouraged patient to increase daily water intake while taking this medication.

## 2021-11-03 ENCOUNTER — Telehealth (HOSPITAL_COMMUNITY): Payer: Self-pay | Admitting: Emergency Medicine

## 2021-11-03 LAB — URINE CULTURE
MICRO NUMBER:: 12661929
SPECIMEN QUALITY:: ADEQUATE

## 2021-11-03 MED ORDER — NITROFURANTOIN MONOHYD MACRO 100 MG PO CAPS: 100.0000 mg | ORAL_CAPSULE | Freq: Two times a day (BID) | ORAL | 0 refills | Status: DC

## 2021-11-04 ENCOUNTER — Ambulatory Visit: Payer: No Typology Code available for payment source | Admitting: Sports Medicine

## 2021-11-04 ENCOUNTER — Other Ambulatory Visit (HOSPITAL_COMMUNITY): Payer: Self-pay

## 2021-11-04 ENCOUNTER — Ambulatory Visit (INDEPENDENT_AMBULATORY_CARE_PROVIDER_SITE_OTHER): Payer: No Typology Code available for payment source

## 2021-11-04 ENCOUNTER — Ambulatory Visit (INDEPENDENT_AMBULATORY_CARE_PROVIDER_SITE_OTHER): Payer: No Typology Code available for payment source | Admitting: Sports Medicine

## 2021-11-04 ENCOUNTER — Other Ambulatory Visit: Payer: Self-pay

## 2021-11-04 DIAGNOSIS — M170 Bilateral primary osteoarthritis of knee: Secondary | ICD-10-CM

## 2021-11-04 MED ORDER — CYCLOSPORINE 0.05 % OP EMUL
OPHTHALMIC | 3 refills | Status: DC
  Filled 2021-11-04: qty 180, 90d supply, fill #0
  Filled 2022-07-13: qty 180, 90d supply, fill #1

## 2021-11-04 NOTE — Assessment & Plan Note (Signed)
Bilateral knee osteoarthritis, recurrence of pain, exacerbation of chronic process with pharmacologic intervention, last injections were 3 months ago, repeat bilateral knee injections today, return to see me as needed.

## 2021-11-04 NOTE — Progress Notes (Signed)
    Procedures performed today:    Procedure: Real-time Ultrasound Guided injection of the left knee Device: Samsung HS60  Verbal informed consent obtained.  Time-out conducted.  Noted no overlying erythema, induration, or other signs of local infection.  Skin prepped in a sterile fashion.  Local anesthesia: Topical Ethyl chloride.  With sterile technique and under real time ultrasound guidance: Noted mild effusion, 1 cc Kenalog 40, 2 cc lidocaine, 2 cc bupivacaine injected easily Completed without difficulty  Advised to call if fevers/chills, erythema, induration, drainage, or persistent bleeding.  Images permanently stored and available for review in PACS.  Impression: Technically successful ultrasound guided injection.  Procedure: Real-time Ultrasound Guided injection of the right knee Device: Samsung HS60  Verbal informed consent obtained.  Time-out conducted.  Noted no overlying erythema, induration, or other signs of local infection.  Skin prepped in a sterile fashion.  Local anesthesia: Topical Ethyl chloride.  With sterile technique and under real time ultrasound guidance: Noted mild effusion, 1 cc Kenalog 40, 2 cc lidocaine, 2 cc bupivacaine injected easily Completed without difficulty  Advised to call if fevers/chills, erythema, induration, drainage, or persistent bleeding.  Images permanently stored and available for review in PACS.  Impression: Technically successful ultrasound guided injection.  Independent interpretation of notes and tests performed by another provider:   None.  Brief History, Exam, Impression, and Recommendations:    Primary osteoarthritis of both knees Bilateral knee osteoarthritis, recurrence of pain, exacerbation of chronic process with pharmacologic intervention, last injections were 3 months ago, repeat bilateral knee injections today, return to see me as needed.    ___________________________________________ Gwen Her. Dianah Field, M.D.,  ABFM., CAQSM. Primary Care and [place] Instructor of [employer] Medicine

## 2021-11-07 ENCOUNTER — Other Ambulatory Visit (HOSPITAL_COMMUNITY): Payer: Self-pay

## 2021-11-12 ENCOUNTER — Other Ambulatory Visit: Payer: Self-pay | Admitting: *Deleted

## 2021-11-12 ENCOUNTER — Inpatient Hospital Stay: Payer: No Typology Code available for payment source

## 2021-11-12 ENCOUNTER — Other Ambulatory Visit: Payer: Self-pay

## 2021-11-12 DIAGNOSIS — C669 Malignant neoplasm of unspecified ureter: Secondary | ICD-10-CM

## 2021-11-12 DIAGNOSIS — Z95828 Presence of other vascular implants and grafts: Secondary | ICD-10-CM

## 2021-11-12 DIAGNOSIS — Z5111 Encounter for antineoplastic chemotherapy: Secondary | ICD-10-CM | POA: Diagnosis not present

## 2021-11-12 DIAGNOSIS — C801 Malignant (primary) neoplasm, unspecified: Secondary | ICD-10-CM

## 2021-11-12 LAB — CMP (CANCER CENTER ONLY)
ALT: 13 U/L (ref 0–44)
AST: 18 U/L (ref 15–41)
Albumin: 3.7 g/dL (ref 3.5–5.0)
Alkaline Phosphatase: 86 U/L (ref 38–126)
Anion gap: 7 (ref 5–15)
BUN: 25 mg/dL — ABNORMAL HIGH (ref 8–23)
CO2: 24 mmol/L (ref 22–32)
Calcium: 8.7 mg/dL — ABNORMAL LOW (ref 8.9–10.3)
Chloride: 106 mmol/L (ref 98–111)
Creatinine: 1.07 mg/dL — ABNORMAL HIGH (ref 0.44–1.00)
GFR, Estimated: 58 mL/min — ABNORMAL LOW (ref >60)
Glucose, Bld: 94 mg/dL (ref 70–99)
Potassium: 4.2 mmol/L (ref 3.5–5.1)
Sodium: 137 mmol/L (ref 135–145)
Total Bilirubin: 0.6 mg/dL (ref 0.3–1.2)
Total Protein: 7.1 g/dL (ref 6.5–8.1)

## 2021-11-12 LAB — CBC WITH DIFFERENTIAL (CANCER CENTER ONLY)
Abs Immature Granulocytes: 0.01 10*3/uL (ref 0.00–0.07)
Basophils Absolute: 0 10*3/uL (ref 0.0–0.1)
Basophils Relative: 0 %
Eosinophils Absolute: 0 10*3/uL (ref 0.0–0.5)
Eosinophils Relative: 1 %
HCT: 25.1 % — ABNORMAL LOW (ref 36.0–46.0)
Hemoglobin: 8.2 g/dL — ABNORMAL LOW (ref 12.0–15.0)
Immature Granulocytes: 0 %
Lymphocytes Relative: 38 %
Lymphs Abs: 1.3 10*3/uL (ref 0.7–4.0)
MCH: 38.7 pg — ABNORMAL HIGH (ref 26.0–34.0)
MCHC: 32.7 g/dL (ref 30.0–36.0)
MCV: 118.4 fL — ABNORMAL HIGH (ref 80.0–100.0)
Monocytes Absolute: 0.6 10*3/uL (ref 0.1–1.0)
Monocytes Relative: 18 %
Neutro Abs: 1.4 10*3/uL — ABNORMAL LOW (ref 1.7–7.7)
Neutrophils Relative %: 43 %
Platelet Count: 101 10*3/uL — ABNORMAL LOW (ref 150–400)
RBC: 2.12 MIL/uL — ABNORMAL LOW (ref 3.87–5.11)
RDW: 20.6 % — ABNORMAL HIGH (ref 11.5–15.5)
WBC Count: 3.4 10*3/uL — ABNORMAL LOW (ref 4.0–10.5)
nRBC: 0 % (ref 0.0–0.2)

## 2021-11-12 LAB — SAMPLE TO BLOOD BANK

## 2021-11-12 LAB — PREPARE RBC (CROSSMATCH)

## 2021-11-12 MED ORDER — SODIUM CHLORIDE 0.9% FLUSH
10.0000 mL | INTRAVENOUS | Status: AC | PRN
  Administered 2021-11-12: 10 mL

## 2021-11-12 MED ORDER — SODIUM CHLORIDE 0.9% IV SOLUTION
250.0000 mL | Freq: Once | INTRAVENOUS | Status: AC
  Administered 2021-11-12: 250 mL via INTRAVENOUS

## 2021-11-12 MED ORDER — ACETAMINOPHEN 325 MG PO TABS
650.0000 mg | ORAL_TABLET | Freq: Once | ORAL | Status: AC
  Administered 2021-11-12: 650 mg via ORAL
  Filled 2021-11-12: qty 2

## 2021-11-12 MED ORDER — DIPHENHYDRAMINE HCL 25 MG PO CAPS
25.0000 mg | ORAL_CAPSULE | Freq: Once | ORAL | Status: AC
  Administered 2021-11-12: 25 mg via ORAL
  Filled 2021-11-12: qty 1

## 2021-11-12 MED ORDER — HEPARIN SOD (PORK) LOCK FLUSH 100 UNIT/ML IV SOLN
500.0000 [IU] | Freq: Every day | INTRAVENOUS | Status: AC | PRN
  Administered 2021-11-12: 500 [IU]

## 2021-11-12 MED ORDER — SODIUM CHLORIDE 0.9% FLUSH
10.0000 mL | Freq: Once | INTRAVENOUS | Status: AC
  Administered 2021-11-12: 10 mL

## 2021-11-12 NOTE — Progress Notes (Signed)
HGB 8.2 today. Per Alen Blew MD, transfuse 1 unit of blood.

## 2021-11-12 NOTE — Patient Instructions (Signed)
Blood Transfusion, Adult, Care After This sheet gives you information about how to care for yourself after your procedure. Your doctor may also give you more specific instructions. If you have problems or questions, contact your doctor. What can I expect after the procedure? After the procedure, it is common to have: Bruising and soreness at the IV site. A headache. Follow these instructions at home: Insertion site care   Follow instructions from your doctor about how to take care of your insertion site. This is where an IV tube was put into your vein. Make sure you: Wash your hands with soap and water before and after you change your bandage (dressing). If you cannot use soap and water, use hand sanitizer. Change your bandage as told by your doctor. Check your insertion site every day for signs of infection. Check for: Redness, swelling, or pain. Bleeding from the site. Warmth. Pus or a bad smell. General instructions Take over-the-counter and prescription medicines only as told by your doctor. Rest as told by your doctor. Go back to your normal activities as told by your doctor. Keep all follow-up visits as told by your doctor. This is important. Contact a doctor if: You have itching or red, swollen areas of skin (hives). You feel worried or nervous (anxious). You feel weak after doing your normal activities. You have redness, swelling, warmth, or pain around the insertion site. You have blood coming from the insertion site, and the blood does not stop with pressure. You have pus or a bad smell coming from the insertion site. Get help right away if: You have signs of a serious reaction. This may be coming from an allergy or the body's defense system (immune system). Signs include: Trouble breathing or shortness of breath. Swelling of the face or feeling warm (flushed). Fever or chills. Head, chest, or back pain. Dark pee (urine) or blood in the pee. Widespread rash. Fast  heartbeat. Feeling dizzy or light-headed. You may receive your blood transfusion in an outpatient setting. If so, you will be told whom to contact to report any reactions. These symptoms may be an emergency. Do not wait to see if the symptoms will go away. Get medical help right away. Call your local emergency services (911 in the [place] Do not drive yourself to the hospital. Summary Bruising and soreness at the IV site are common. Check your insertion site every day for signs of infection. Rest as told by your doctor. Go back to your normal activities as told by your doctor. Get help right away if you have signs of a serious reaction. This information is not intended to replace advice given to you by your health care provider. Make sure you discuss any questions you have with your health care provider. Document Revised: 03/27/2021 Document Reviewed: 05/25/2019 Elsevier Patient Education  2022 [employer] Inc.  

## 2021-11-13 LAB — TYPE AND SCREEN
ABO/RH(D): A POS
Antibody Screen: NEGATIVE
Unit division: 0

## 2021-11-13 LAB — BPAM RBC
Blood Product Expiration Date: 202212142359
ISSUE DATE / TIME: 202211301316
Unit Type and Rh: 6200

## 2021-11-18 ENCOUNTER — Other Ambulatory Visit (HOSPITAL_COMMUNITY): Payer: Self-pay

## 2021-11-20 ENCOUNTER — Inpatient Hospital Stay (HOSPITAL_BASED_OUTPATIENT_CLINIC_OR_DEPARTMENT_OTHER): Payer: No Typology Code available for payment source | Admitting: Oncology

## 2021-11-20 ENCOUNTER — Other Ambulatory Visit: Payer: Self-pay

## 2021-11-20 ENCOUNTER — Inpatient Hospital Stay: Payer: No Typology Code available for payment source | Attending: Oncology

## 2021-11-20 ENCOUNTER — Inpatient Hospital Stay: Payer: No Typology Code available for payment source

## 2021-11-20 VITALS — BP 139/71 | HR 79 | Temp 97.7°F | Resp 18 | Ht 66.0 in | Wt 178.1 lb

## 2021-11-20 DIAGNOSIS — C772 Secondary and unspecified malignant neoplasm of intra-abdominal lymph nodes: Secondary | ICD-10-CM | POA: Diagnosis not present

## 2021-11-20 DIAGNOSIS — D696 Thrombocytopenia, unspecified: Secondary | ICD-10-CM | POA: Insufficient documentation

## 2021-11-20 DIAGNOSIS — G629 Polyneuropathy, unspecified: Secondary | ICD-10-CM | POA: Diagnosis not present

## 2021-11-20 DIAGNOSIS — Z5111 Encounter for antineoplastic chemotherapy: Secondary | ICD-10-CM | POA: Diagnosis not present

## 2021-11-20 DIAGNOSIS — C669 Malignant neoplasm of unspecified ureter: Secondary | ICD-10-CM

## 2021-11-20 DIAGNOSIS — D709 Neutropenia, unspecified: Secondary | ICD-10-CM | POA: Insufficient documentation

## 2021-11-20 DIAGNOSIS — R5383 Other fatigue: Secondary | ICD-10-CM | POA: Diagnosis not present

## 2021-11-20 DIAGNOSIS — Z79899 Other long term (current) drug therapy: Secondary | ICD-10-CM | POA: Diagnosis not present

## 2021-11-20 DIAGNOSIS — R0609 Other forms of dyspnea: Secondary | ICD-10-CM | POA: Diagnosis not present

## 2021-11-20 DIAGNOSIS — C801 Malignant (primary) neoplasm, unspecified: Secondary | ICD-10-CM

## 2021-11-20 DIAGNOSIS — D630 Anemia in neoplastic disease: Secondary | ICD-10-CM | POA: Diagnosis present

## 2021-11-20 LAB — CMP (CANCER CENTER ONLY)
ALT: 11 U/L (ref 0–44)
AST: 14 U/L — ABNORMAL LOW (ref 15–41)
Albumin: 3.5 g/dL (ref 3.5–5.0)
Alkaline Phosphatase: 100 U/L (ref 38–126)
Anion gap: 10 (ref 5–15)
BUN: 26 mg/dL — ABNORMAL HIGH (ref 8–23)
CO2: 22 mmol/L (ref 22–32)
Calcium: 9 mg/dL (ref 8.9–10.3)
Chloride: 109 mmol/L (ref 98–111)
Creatinine: 1.25 mg/dL — ABNORMAL HIGH (ref 0.44–1.00)
GFR, Estimated: 48 mL/min — ABNORMAL LOW (ref >60)
Glucose, Bld: 121 mg/dL — ABNORMAL HIGH (ref 70–99)
Potassium: 4.2 mmol/L (ref 3.5–5.1)
Sodium: 141 mmol/L (ref 135–145)
Total Bilirubin: 0.2 mg/dL — ABNORMAL LOW (ref 0.3–1.2)
Total Protein: 7 g/dL (ref 6.5–8.1)

## 2021-11-20 LAB — CBC WITH DIFFERENTIAL (CANCER CENTER ONLY)
Abs Immature Granulocytes: 0.01 10*3/uL (ref 0.00–0.07)
Basophils Absolute: 0 10*3/uL (ref 0.0–0.1)
Basophils Relative: 1 %
Eosinophils Absolute: 0.1 10*3/uL (ref 0.0–0.5)
Eosinophils Relative: 2 %
HCT: 30.1 % — ABNORMAL LOW (ref 36.0–46.0)
Hemoglobin: 9.9 g/dL — ABNORMAL LOW (ref 12.0–15.0)
Immature Granulocytes: 0 %
Lymphocytes Relative: 36 %
Lymphs Abs: 1.6 10*3/uL (ref 0.7–4.0)
MCH: 37.8 pg — ABNORMAL HIGH (ref 26.0–34.0)
MCHC: 32.9 g/dL (ref 30.0–36.0)
MCV: 114.9 fL — ABNORMAL HIGH (ref 80.0–100.0)
Monocytes Absolute: 0.9 10*3/uL (ref 0.1–1.0)
Monocytes Relative: 21 %
Neutro Abs: 1.9 10*3/uL (ref 1.7–7.7)
Neutrophils Relative %: 40 %
Platelet Count: 129 10*3/uL — ABNORMAL LOW (ref 150–400)
RBC: 2.62 MIL/uL — ABNORMAL LOW (ref 3.87–5.11)
RDW: 18.8 % — ABNORMAL HIGH (ref 11.5–15.5)
WBC Count: 4.5 10*3/uL (ref 4.0–10.5)
nRBC: 0 % (ref 0.0–0.2)

## 2021-11-20 MED ORDER — FLUOROURACIL CHEMO INJECTION 2.5 GM/50ML
400.0000 mg/m2 | Freq: Once | INTRAVENOUS | Status: AC
  Administered 2021-11-20: 800 mg via INTRAVENOUS
  Filled 2021-11-20: qty 16

## 2021-11-20 MED ORDER — ONDANSETRON HCL 4 MG/2ML IJ SOLN
8.0000 mg | Freq: Once | INTRAMUSCULAR | Status: AC
  Administered 2021-11-20: 8 mg via INTRAVENOUS
  Filled 2021-11-20: qty 4

## 2021-11-20 MED ORDER — SODIUM CHLORIDE 0.9 % IV SOLN
2400.0000 mg/m2 | INTRAVENOUS | Status: DC
  Administered 2021-11-20: 4800 mg via INTRAVENOUS
  Filled 2021-11-20: qty 96

## 2021-11-20 MED ORDER — SODIUM CHLORIDE 0.9% FLUSH: 10.0000 mL | INTRAVENOUS | Status: DC | PRN

## 2021-11-20 MED ORDER — LEUCOVORIN CALCIUM INJECTION 350 MG
400.0000 mg/m2 | Freq: Once | INTRAVENOUS | Status: AC
  Administered 2021-11-20: 804 mg via INTRAVENOUS
  Filled 2021-11-20: qty 25

## 2021-11-20 MED ORDER — HEPARIN SOD (PORK) LOCK FLUSH 100 UNIT/ML IV SOLN: 500.0000 [IU] | Freq: Once | INTRAVENOUS | Status: DC | PRN

## 2021-11-20 MED ORDER — DEXTROSE 5 % IV SOLN
Freq: Once | INTRAVENOUS | Status: AC
Start: 2021-11-20 — End: 2021-11-20

## 2021-11-20 NOTE — Progress Notes (Signed)
Denies anyHematology and Oncology Follow Up Visit  Mary Stark [id number] 1956/06/15 65 y.o. 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  She presented with pelvic lymphadenopathy but no visceral metastasis.  Prior Therapy:  She is status post retroperitoneal lymph node biopsy completed on 10/24/2020 which showed metastatic carcinoma.  She is status post repeat cystoscopy and bilateral ureteral stent exchange as well as resection of a bladder tumor on January 21, 2021.  A final pathology showed an adenocarcinoma.  Chemotherapy utilizing carboplatin and gemcitabine started on November 22, 2020.  She completed 6 cycles of therapy on March 06, 2021.  FOLFOX chemotherapy started on Apr 15, 2021.  She completed 12 cycles of therapy.  Current therapy: 5-FU and leucovorin chemotherapy every 2 weeks started in November 2022.   Interim History: Mary Stark presents today for repeat evaluation.  Since her last visit, she did require 2 units of packed red cell transfusion because of anemia.  She felt better after that and feeling well overall.  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  She has reported some fatigue, tiredness and dyspnea on exertion with low hemoglobin.  He does report mild fatigue overall.  Performance status quality of life remained stable.     Medications: Updated on review. Current Outpatient Medications  Medication Sig Dispense Refill   acetaminophen (TYLENOL) 500 MG tablet Take 625 mg by mouth every 8 (eight) hours as needed for moderate pain.     albuterol (PROAIR HFA) 108 (90 Base) MCG/ACT inhaler INHALE 1 TO 2 PUFFS BY MOUTH INTO THE LUNGS EVERY 4 HOURS AS NEEDED FOR WHEEZING OR SHORTNESS OF BREATH 8 g 1   amLODipine (NORVASC) 5 MG tablet Take 5 mg by mouth as  needed.     ARIPiprazole (ABILIFY) 10 MG tablet TAKE 1 TABLET BY MOUTH ONCE DAILY (Patient taking differently: Take 10 mg by mouth daily.) 90 tablet 0   ascorbic acid (VITAMIN C) 500 MG tablet Take 1,000 mg by mouth every evening.      benzonatate (TESSALON) 200 MG capsule Take 1 capsule (200 mg total) by mouth 3 (three) times daily as needed for cough. 30 capsule 0   buPROPion (WELLBUTRIN XL) 300 MG 24 hr tablet TAKE 1 TABLET BY MOUTH EVERY DAY (Patient taking differently: Take 300 mg by mouth daily.) 30 tablet 3   cephALEXin (KEFLEX) 250 MG capsule TAKE ONE CAPSULE BY MOUTH NIGHTLY 90 capsule 1   Cholecalciferol (VITAMIN D) 125 MCG (5000 UT) CAPS Take 5,000 Units by mouth every evening.      clonazePAM (KLONOPIN) 0.5 MG tablet TAKE 1 TABLET BY MOUTH AT BEDTIME 30 tablet 0   Coenzyme Q10 (CO Q-10) 200 MG CAPS Take 200 mg by mouth every evening.      cycloSPORINE (RESTASIS) 0.05 % ophthalmic emulsion Place 1 drop both eyes twice a day 180 each 3   fluticasone-salmeterol (ADVAIR HFA) 115-21 MCG/ACT inhaler Inhale 2 puffs into the lungs 2 (two) times daily. (Patient taking differently: Inhale 2 puffs into the lungs 2 (two) times daily. Havent had it filled) 1 each 0   gabapentin (NEURONTIN) 300 MG capsule TAKE 1 CAPSULE BY MOUTH IN THE MORNING, 1 CAPSULE IN THE AFTERNOON & 4 CAPSULES AT NIGHT (Patient taking differently: Take 300 mg by mouth 4 (four) times daily.) 180 capsule 3  GLUCOSAMINE-CHONDROITIN PO Take 2 tablets by mouth every evening.      lidocaine-prilocaine (EMLA) cream Apply 1 application topically as needed. 30 g 0   Meth-Hyo-M Bl-Na Phos-Ph Sal (URIBEL) 118 MG CAPS Take one capsule by mouth 3 times daily (Patient taking differently: as needed.) 30 capsule 3   mirabegron ER (MYRBETRIQ) 25 MG TB24 tablet TAKE 1 TABLET BY MOUTH ONCE A DAY 30 tablet 3   Nebivolol HCl 20 MG TABS Take 1/2 tablet (10 mg total) by mouth daily. 90 tablet 1   nitrofurantoin, macrocrystal-monohydrate, (MACROBID)  100 MG capsule Take 1 capsule (100 mg total) by mouth 2 (two) times daily. 10 capsule 0   Omega-3 Fatty Acids (FISH OIL) 1200 MG CAPS Take 1,200 mg by mouth every evening.      omeprazole (PRILOSEC) 20 MG capsule Take 1 capsule (20 mg total) by mouth daily. 90 capsule 3   ondansetron (ZOFRAN) 4 MG tablet Take 1 tablet (4 mg total) by mouth every 8 (eight) hours as needed for nausea or vomiting. 40 tablet 3   oseltamivir (TAMIFLU) 75 MG capsule Take 1 capsule (75 mg total) by mouth every 12 (twelve) hours. 10 capsule 0   oxybutynin (DITROPAN) 5 MG tablet TAKE 1 TABLET BY MOUTH EVERY 8 HOURS AS NEEDED 90 tablet 3   oxymetazoline (AFRIN) 0.05 % nasal spray Place 1 spray into both nostrils 2 (two) times daily as needed for congestion. For nose bleeds     Phenazopyridine HCl (AZO URINARY PAIN PO) Take 2 tablets by mouth 3 (three) times daily as needed (bladder spasms).     simvastatin (ZOCOR) 20 MG tablet TAKE 1 TABLET (20 MG TOTAL) BY MOUTH DAILY. (Patient taking differently: Take 20 mg by mouth at bedtime.) 90 tablet 3   tamsulosin (FLOMAX) 0.4 MG CAPS capsule TAKE 1 CAPSULE BY MOUTH AT BEDTIME 30 capsule 3   thiamine 100 MG tablet Take 1 tablet (100 mg total) by mouth daily. 30 tablet 0   traMADol (ULTRAM) 50 MG tablet Take 1 tablet (50 mg total) by mouth 3 (three) times daily as needed. 30 tablet 3   vitamin B-12 (CYANOCOBALAMIN) 1000 MCG tablet Take 2,000 mcg by mouth every evening.      Vitamin E 180 MG (400 UNIT) CAPS Take 400 Units by mouth every evening.      No current facility-administered medications for this visit.     Allergies:  Allergies  Allergen Reactions   Metformin And Related Other (See Comments)    Lactic acidosis    Penicillins Hives and Rash    Reports hives to penicillin and amoxicillin as an adult "a long time ago." Has tolerated cefdinir (09/2020) and cefepime (10/2020) before.   Metformin     NDC Code:00093721201 NDC MVEH:[id number]   Penicillin G Sodium     NDC  MOQH:[id number] NDC SFKC:[id number] NDC BSWH:[id number]      Physical Exam:  Blood pressure 139/71, pulse 79, temperature 97.7 F (36.5 C), temperature source Temporal, resp. rate 18, height 5\' 6"  (1.676 m), weight 178 lb 1.6 oz (80.8 kg), SpO2 97 %.    ECOG: 1     General appearance: Comfortable appearing without any discomfort Head: Normocephalic without any trauma Oropharynx: Mucous membranes are moist and pink without any thrush or ulcers. Eyes: Pupils are equal and round reactive to light. Lymph nodes: No cervical, supraclavicular, inguinal or axillary lymphadenopathy.   Heart:regular rate and rhythm.  S1 and S2 without leg edema. Lung: Clear without any rhonchi or wheezes.  No dullness to percussion. Abdomin: Soft, nontender, nondistended with good bowel sounds.  No hepatosplenomegaly. Musculoskeletal: No joint deformity or effusion.  Full range of motion noted. Neurological: No deficits noted on motor, sensory and deep tendon reflex exam. Skin: No petechial rash or dryness.  Appeared moist.                   Lab Results: Lab Results  Component Value Date   WBC 3.4 (L) 11/12/2021   HGB 8.2 (L) 11/12/2021   HCT 25.1 (L) 11/12/2021   MCV 118.4 (H) 11/12/2021   PLT 101 (L) 11/12/2021     Chemistry      Component Value Date/Time   NA 137 11/12/2021 1104   NA 142 04/26/2018 1333   K 4.2 11/12/2021 1104   CL 106 11/12/2021 1104   CO2 24 11/12/2021 1104   BUN 25 (H) 11/12/2021 1104   BUN 22 04/26/2018 1333   CREATININE 1.07 (H) 11/12/2021 1104   CREATININE 1.32 (H) 06/04/2021 0000   GLU 87 02/07/2013 0000      Component Value Date/Time   CALCIUM 8.7 (L) 11/12/2021 1104   ALKPHOS 86 11/12/2021 1104   AST 18 11/12/2021 1104   ALT 13 11/12/2021 1104   BILITOT 0.6 11/12/2021 1104       Impression and Plan:    65 year old woman with:   1.   Bladder cancer diagnosed in 2021.  She was found to have stage IV disease with pelvic  adenopathy.  She is currently receiving 5-FU and leucovorin chemotherapy for maintenance purposes after completing 12 cycles of FOLFOX.  Complication associated with this treatment including nausea, fatigue, infusion related complications among others.  She is agreeable to continue at this time and we will update her staging scans in 2 months.    2.  IV access: Port-A-Cath continues to be in use without any issues.   3.  Antiemetics: Compazine is available to her without any nausea or vomiting.   4.  Renal function surveillance: Kidney function remained stable at this time with bilateral ureteral stents are in place.  Managed by Dr. Claudia Desanctis.   5.  Goals of care: Therapy remains palliative although aggressive measures are warranted at this time.   6.  Anemia: Hemoglobin is adequate today at 9.9 and does not require any further intervention.  7.  Neutropenia: She no longer requires growth factor support at this time.  Neutropenia is related to oxaliplatin.  8.  Thrombocytopenia: Improved upon discontinuation of oxaliplatin.   9.  Follow-up: In 2 weeks for repeat evaluation and next cycle of therapy.   30  minutes were d spent on this visit.  The time was dedicated to reviewing laboratory data, disease status update, treatment choices and future plan of care discussion.   Zola Button, MD 12/8/20228:32 AM

## 2021-11-20 NOTE — Patient Instructions (Signed)
[facility]  Discharge Instructions: Thank you for choosing [facility] to provide your oncology and hematology care.   If you have a lab appointment with the [facility], please go directly to the [facility] and check in at the registration area.   Wear comfortable clothing and clothing appropriate for easy access to any Portacath or PICC line.   We strive to give you quality time with your provider. You may need to reschedule your appointment if you arrive late (15 or more minutes).  Arriving late affects you and other patients whose appointments are after yours.  Also, if you miss three or more appointments without notifying the office, you may be dismissed from the clinic at the provider's discretion.      For prescription refill requests, have your pharmacy contact our office and allow 72 hours for refills to be completed.    Today you received the following chemotherapy and/or immunotherapy agents: Leucovorin/ 5FU      To help prevent nausea and vomiting after your treatment, we encourage you to take your nausea medication as directed.  BELOW ARE SYMPTOMS THAT SHOULD BE REPORTED IMMEDIATELY: *FEVER GREATER THAN 100.4 F (38 C) OR HIGHER *CHILLS OR SWEATING *NAUSEA AND VOMITING THAT IS NOT CONTROLLED WITH YOUR NAUSEA MEDICATION *UNUSUAL SHORTNESS OF BREATH *UNUSUAL BRUISING OR BLEEDING *URINARY PROBLEMS (pain or burning when urinating, or frequent urination) *BOWEL PROBLEMS (unusual diarrhea, constipation, pain near the anus) TENDERNESS IN MOUTH AND THROAT WITH OR WITHOUT PRESENCE OF ULCERS (sore throat, sores in mouth, or a toothache) UNUSUAL RASH, SWELLING OR PAIN  UNUSUAL VAGINAL DISCHARGE OR ITCHING   Items with * indicate a potential emergency and should be followed up as soon as possible or go to the Emergency Department if any problems should occur.  Please show the CHEMOTHERAPY ALERT CARD or IMMUNOTHERAPY ALERT CARD at  check-in to the Emergency Department and triage nurse.  Should you have questions after your visit or need to cancel or reschedule your appointment, please contact [facility]  Dept: [phone]  and follow the prompts.  Office hours are 8:00 a.m. to 4:30 p.m. Monday - Friday. Please note that voicemails left after 4:00 p.m. may not be returned until the following business day.  We are closed weekends and major holidays. You have access to a nurse at all times for urgent questions. Please call the main number to the clinic Dept: [phone] and follow the prompts.   For any non-urgent questions, you may also contact your provider using MyChart. We now offer e-Visits for anyone 68 and older to request care online for non-urgent symptoms. For details visit [email or web address].   Also download the MyChart app! Go to the app store, search "MyChart", open the app, select [facility], and log in with your MyChart username and password.  Due to Covid, a mask is required upon entering the hospital/clinic. If you do not have a mask, one will be given to you upon arrival. For doctor visits, patients may have 1 support person aged 53 or older with them. For treatment visits, patients cannot have anyone with them due to current Covid guidelines and our immunocompromised population.

## 2021-11-22 ENCOUNTER — Other Ambulatory Visit: Payer: Self-pay

## 2021-11-22 ENCOUNTER — Inpatient Hospital Stay: Payer: No Typology Code available for payment source

## 2021-11-22 VITALS — BP 141/77 | HR 71 | Temp 97.6°F | Resp 20

## 2021-11-22 DIAGNOSIS — Z5111 Encounter for antineoplastic chemotherapy: Secondary | ICD-10-CM | POA: Diagnosis not present

## 2021-11-22 DIAGNOSIS — C669 Malignant neoplasm of unspecified ureter: Secondary | ICD-10-CM

## 2021-11-22 DIAGNOSIS — C801 Malignant (primary) neoplasm, unspecified: Secondary | ICD-10-CM

## 2021-11-22 MED ORDER — SODIUM CHLORIDE 0.9% FLUSH
10.0000 mL | INTRAVENOUS | Status: DC | PRN
  Administered 2021-11-22: 10 mL

## 2021-11-22 MED ORDER — HEPARIN SOD (PORK) LOCK FLUSH 100 UNIT/ML IV SOLN
500.0000 [IU] | Freq: Once | INTRAVENOUS | Status: AC | PRN
  Administered 2021-11-22: 500 [IU]

## 2021-11-27 ENCOUNTER — Other Ambulatory Visit: Payer: Self-pay | Admitting: Physician Assistant

## 2021-11-27 ENCOUNTER — Other Ambulatory Visit (HOSPITAL_COMMUNITY): Payer: Self-pay

## 2021-11-28 ENCOUNTER — Other Ambulatory Visit (HOSPITAL_COMMUNITY): Payer: Self-pay

## 2021-11-28 MED ORDER — CLONAZEPAM 0.5 MG PO TABS
ORAL_TABLET | Freq: Every day | ORAL | 1 refills | Status: DC
  Filled 2021-11-28: qty 30, 30d supply, fill #0
  Filled 2021-12-25: qty 30, 30d supply, fill #1

## 2021-12-04 ENCOUNTER — Inpatient Hospital Stay (HOSPITAL_BASED_OUTPATIENT_CLINIC_OR_DEPARTMENT_OTHER): Payer: No Typology Code available for payment source | Admitting: Oncology

## 2021-12-04 ENCOUNTER — Inpatient Hospital Stay: Payer: No Typology Code available for payment source

## 2021-12-04 ENCOUNTER — Other Ambulatory Visit: Payer: Self-pay

## 2021-12-04 VITALS — BP 159/89 | HR 72 | Temp 97.7°F | Resp 18 | Ht 66.0 in | Wt 179.4 lb

## 2021-12-04 DIAGNOSIS — Z5111 Encounter for antineoplastic chemotherapy: Secondary | ICD-10-CM | POA: Diagnosis not present

## 2021-12-04 DIAGNOSIS — C669 Malignant neoplasm of unspecified ureter: Secondary | ICD-10-CM

## 2021-12-04 DIAGNOSIS — C801 Malignant (primary) neoplasm, unspecified: Secondary | ICD-10-CM

## 2021-12-04 DIAGNOSIS — Z95828 Presence of other vascular implants and grafts: Secondary | ICD-10-CM

## 2021-12-04 LAB — CBC WITH DIFFERENTIAL (CANCER CENTER ONLY)
Abs Immature Granulocytes: 0.01 10*3/uL (ref 0.00–0.07)
Basophils Absolute: 0 10*3/uL (ref 0.0–0.1)
Basophils Relative: 1 %
Eosinophils Absolute: 0.1 10*3/uL (ref 0.0–0.5)
Eosinophils Relative: 2 %
HCT: 28.7 % — ABNORMAL LOW (ref 36.0–46.0)
Hemoglobin: 9.6 g/dL — ABNORMAL LOW (ref 12.0–15.0)
Immature Granulocytes: 0 %
Lymphocytes Relative: 30 %
Lymphs Abs: 1.5 10*3/uL (ref 0.7–4.0)
MCH: 37.6 pg — ABNORMAL HIGH (ref 26.0–34.0)
MCHC: 33.4 g/dL (ref 30.0–36.0)
MCV: 112.5 fL — ABNORMAL HIGH (ref 80.0–100.0)
Monocytes Absolute: 0.7 10*3/uL (ref 0.1–1.0)
Monocytes Relative: 14 %
Neutro Abs: 2.7 10*3/uL (ref 1.7–7.7)
Neutrophils Relative %: 53 %
Platelet Count: 129 10*3/uL — ABNORMAL LOW (ref 150–400)
RBC: 2.55 MIL/uL — ABNORMAL LOW (ref 3.87–5.11)
RDW: 16.2 % — ABNORMAL HIGH (ref 11.5–15.5)
WBC Count: 5 10*3/uL (ref 4.0–10.5)
nRBC: 0 % (ref 0.0–0.2)

## 2021-12-04 LAB — CMP (CANCER CENTER ONLY)
ALT: 8 U/L (ref 0–44)
AST: 13 U/L — ABNORMAL LOW (ref 15–41)
Albumin: 4 g/dL (ref 3.5–5.0)
Alkaline Phosphatase: 97 U/L (ref 38–126)
Anion gap: 8 (ref 5–15)
BUN: 21 mg/dL (ref 8–23)
CO2: 25 mmol/L (ref 22–32)
Calcium: 8.9 mg/dL (ref 8.9–10.3)
Chloride: 103 mmol/L (ref 98–111)
Creatinine: 1.27 mg/dL — ABNORMAL HIGH (ref 0.44–1.00)
GFR, Estimated: 47 mL/min — ABNORMAL LOW (ref >60)
Glucose, Bld: 97 mg/dL (ref 70–99)
Potassium: 4.7 mmol/L (ref 3.5–5.1)
Sodium: 136 mmol/L (ref 135–145)
Total Bilirubin: 0.5 mg/dL (ref 0.3–1.2)
Total Protein: 7.2 g/dL (ref 6.5–8.1)

## 2021-12-04 LAB — SAMPLE TO BLOOD BANK

## 2021-12-04 MED ORDER — DEXTROSE 5 % IV SOLN: Freq: Once | INTRAVENOUS | Status: AC

## 2021-12-04 MED ORDER — ONDANSETRON HCL 4 MG/2ML IJ SOLN
8.0000 mg | Freq: Once | INTRAMUSCULAR | Status: AC
  Administered 2021-12-04: 13:00:00 8 mg via INTRAVENOUS
  Filled 2021-12-04: qty 4

## 2021-12-04 MED ORDER — SODIUM CHLORIDE 0.9% FLUSH
10.0000 mL | Freq: Once | INTRAVENOUS | Status: AC
  Administered 2021-12-04: 11:00:00 10 mL

## 2021-12-04 MED ORDER — LEUCOVORIN CALCIUM INJECTION 350 MG
400.0000 mg/m2 | Freq: Once | INTRAVENOUS | Status: AC
  Administered 2021-12-04: 13:00:00 804 mg via INTRAVENOUS
  Filled 2021-12-04: qty 25

## 2021-12-04 MED ORDER — FLUOROURACIL CHEMO INJECTION 2.5 GM/50ML
400.0000 mg/m2 | Freq: Once | INTRAVENOUS | Status: AC
  Administered 2021-12-04: 14:00:00 800 mg via INTRAVENOUS
  Filled 2021-12-04: qty 16

## 2021-12-04 MED ORDER — SODIUM CHLORIDE 0.9 % IV SOLN
2400.0000 mg/m2 | INTRAVENOUS | Status: DC
  Administered 2021-12-04: 14:00:00 4800 mg via INTRAVENOUS
  Filled 2021-12-04: qty 96

## 2021-12-04 NOTE — Progress Notes (Signed)
Denies anyHematology and Oncology Follow Up Visit  Mary Stark [id number] [birth year] 65 y.o. 12/04/2021 11:19 AM Mary Stark, NPAlexander, Mary [place], DO   [name]: 65 year old woman with bladder cancer diagnosed in November 2021.  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     Prior Therapy:  She is status post retroperitoneal lymph node biopsy completed on 10/24/2020 which showed metastatic carcinoma.  She is status post repeat cystoscopy and bilateral ureteral stent exchange as well as resection of a bladder tumor on January 21, 2021.  A final pathology showed an adenocarcinoma.  Chemotherapy utilizing carboplatin and gemcitabine started on November 22, 2020.  She completed 6 cycles of therapy on March 06, 2021.  FOLFOX chemotherapy started on Apr 15, 2021.  She completed 12 cycles of therapy.  Current therapy: 5-FU and leucovorin chemotherapy every 2 weeks started in November 2022.  She is here for the next cycle of therapy.   Interim History: Mary Stark returns today for repeat evaluation.  Since her last visit, she reports no major changes in her health.  She has tolerated chemotherapy without any major issues or complaints.  She denies any nausea, vomiting or abdominal pain.  She does report some mild fatigue and tiredness.  She denies recent hospitalizations or illnesses.     Medications: Reviewed without changes. Current Outpatient Medications  Medication Sig Dispense Refill   acetaminophen (TYLENOL) 500 MG tablet Take 625 mg by mouth every 8 (eight) hours as needed for moderate pain.     albuterol (PROAIR HFA) 108 (90 Base) MCG/ACT inhaler INHALE 1 TO 2 PUFFS BY MOUTH INTO THE LUNGS EVERY 4 HOURS AS NEEDED FOR WHEEZING OR SHORTNESS OF BREATH 8 g 1   amLODipine (NORVASC) 5 MG tablet Take 5 mg by mouth as needed.     ARIPiprazole (ABILIFY) 10 MG tablet TAKE 1 TABLET BY MOUTH ONCE DAILY (Patient taking differently: Take 10  mg by mouth daily.) 90 tablet 0   ascorbic acid (VITAMIN C) 500 MG tablet Take 1,000 mg by mouth every evening.      benzonatate (TESSALON) 200 MG capsule Take 1 capsule (200 mg total) by mouth 3 (three) times daily as needed for cough. 30 capsule 0   buPROPion (WELLBUTRIN XL) 300 MG 24 hr tablet TAKE 1 TABLET BY MOUTH EVERY DAY (Patient taking differently: Take 300 mg by mouth daily.) 30 tablet 3   cephALEXin (KEFLEX) 250 MG capsule TAKE ONE CAPSULE BY MOUTH NIGHTLY 90 capsule 1   Cholecalciferol (VITAMIN D) 125 MCG (5000 UT) CAPS Take 5,000 Units by mouth every evening.      clonazePAM (KLONOPIN) 0.5 MG tablet TAKE 1 TABLET BY MOUTH AT BEDTIME 30 tablet 1   Coenzyme Q10 (CO Q-10) 200 MG CAPS Take 200 mg by mouth every evening.      cycloSPORINE (RESTASIS) 0.05 % ophthalmic emulsion Place 1 drop both eyes twice a day 180 each 3   fluticasone-salmeterol (ADVAIR HFA) 115-21 MCG/ACT inhaler Inhale 2 puffs into the lungs 2 (two) times daily. (Patient taking differently: Inhale 2 puffs into the lungs 2 (two) times daily. Havent had it filled) 1 each 0   gabapentin (NEURONTIN) 300 MG capsule TAKE 1 CAPSULE BY MOUTH IN THE MORNING, 1 CAPSULE IN THE AFTERNOON & 4 CAPSULES AT NIGHT (Patient taking differently: Take 300 mg by mouth 4 (four) times daily.) 180 capsule 3   GLUCOSAMINE-CHONDROITIN PO Take 2 tablets by mouth every evening.      lidocaine-prilocaine (  EMLA) cream Apply 1 application topically as needed. 30 g 0   Meth-Hyo-M Bl-Na Phos-Ph Sal (URIBEL) 118 MG CAPS Take one capsule by mouth 3 times daily (Patient taking differently: as needed.) 30 capsule 3   mirabegron ER (MYRBETRIQ) 25 MG TB24 tablet TAKE 1 TABLET BY MOUTH ONCE A DAY 30 tablet 3   Nebivolol HCl 20 MG TABS Take 1/2 tablet (10 mg total) by mouth daily. 90 tablet 1   nitrofurantoin, macrocrystal-monohydrate, (MACROBID) 100 MG capsule Take 1 capsule (100 mg total) by mouth 2 (two) times daily. 10 capsule 0   Omega-3 Fatty Acids (FISH  OIL) 1200 MG CAPS Take 1,200 mg by mouth every evening.      omeprazole (PRILOSEC) 20 MG capsule Take 1 capsule (20 mg total) by mouth daily. 90 capsule 3   ondansetron (ZOFRAN) 4 MG tablet Take 1 tablet (4 mg total) by mouth every 8 (eight) hours as needed for nausea or vomiting. 40 tablet 3   oseltamivir (TAMIFLU) 75 MG capsule Take 1 capsule (75 mg total) by mouth every 12 (twelve) hours. 10 capsule 0   oxybutynin (DITROPAN) 5 MG tablet TAKE 1 TABLET BY MOUTH EVERY 8 HOURS AS NEEDED 90 tablet 3   oxymetazoline (AFRIN) 0.05 % nasal spray Place 1 spray into both nostrils 2 (two) times daily as needed for congestion. For nose bleeds     Phenazopyridine HCl (AZO URINARY PAIN PO) Take 2 tablets by mouth 3 (three) times daily as needed (bladder spasms).     simvastatin (ZOCOR) 20 MG tablet TAKE 1 TABLET (20 MG TOTAL) BY MOUTH DAILY. (Patient taking differently: Take 20 mg by mouth at bedtime.) 90 tablet 3   tamsulosin (FLOMAX) 0.4 MG CAPS capsule TAKE 1 CAPSULE BY MOUTH AT BEDTIME 30 capsule 3   thiamine 100 MG tablet Take 1 tablet (100 mg total) by mouth daily. 30 tablet 0   traMADol (ULTRAM) 50 MG tablet Take 1 tablet (50 mg total) by mouth 3 (three) times daily as needed. 30 tablet 3   vitamin B-12 (CYANOCOBALAMIN) 1000 MCG tablet Take 2,000 mcg by mouth every evening.      Vitamin E 180 MG (400 UNIT) CAPS Take 400 Units by mouth every evening.      No current facility-administered medications for this visit.     Allergies:  Allergies  Allergen Reactions   Metformin And Related Other (See Comments)    Lactic acidosis    Penicillins Hives and Rash    Reports hives to penicillin and amoxicillin as an adult "a long time ago." Has tolerated cefdinir (09/2020) and cefepime (10/2020) before.   Metformin     NDC Code:00093721201 NDC BLTJ:[id number]   Penicillin G Sodium     NDC MAUQ:[id number] NDC SLHT:[id number] NDC WIOM:[id number]      Physical Exam:   Blood pressure (!) 159/89,  pulse 72, temperature 97.7 F (36.5 C), temperature source Temporal, resp. rate 18, height 5\' 6"  (1.676 m), weight 179 lb 6.4 oz (81.4 kg), SpO2 97 %.    ECOG: 1   General appearance: Alert, awake without any distress. Head: Atraumatic without abnormalities Oropharynx: Without any thrush or ulcers. Eyes: No scleral icterus. Lymph nodes: No lymphadenopathy noted in the cervical, supraclavicular, or axillary nodes Heart:regular rate and rhythm, without any murmurs or gallops.   Lung: Clear to auscultation without any rhonchi, wheezes or dullness to percussion. Abdomin: Soft, nontender without any shifting dullness or ascites. Musculoskeletal: No clubbing or cyanosis. Neurological: No motor or sensory deficits. Skin:  No rashes or lesions.                  Lab Results: Lab Results  Component Value Date   WBC 4.5 11/20/2021   HGB 9.9 (L) 11/20/2021   HCT 30.1 (L) 11/20/2021   MCV 114.9 (H) 11/20/2021   PLT 129 (L) 11/20/2021     Chemistry      Component Value Date/Time   NA 141 11/20/2021 0805   NA 142 04/26/2018 1333   K 4.2 11/20/2021 0805   CL 109 11/20/2021 0805   CO2 22 11/20/2021 0805   BUN 26 (H) 11/20/2021 0805   BUN 22 04/26/2018 1333   CREATININE 1.25 (H) 11/20/2021 0805   CREATININE 1.32 (H) 06/04/2021 0000   GLU 87 02/07/2013 0000      Component Value Date/Time   CALCIUM 9.0 11/20/2021 0805   ALKPHOS 100 11/20/2021 0805   AST 14 (L) 11/20/2021 0805   ALT 11 11/20/2021 0805   BILITOT 0.2 (L) 11/20/2021 0805       Impression and Plan:    65 year old woman with:   1.   Stage IV bladder cancer with pelvic adenopathy and adenocarcinoma histology diagnosed in 2021.    She continues to tolerate a maintenance chemotherapy without any major complaints.  Risks and benefits of continuing this treatment were discussed.  Complications that include nausea, vomiting, myelosuppression and infusion related issues were reviewed.  The plan is to update  her staging scans in the next 6 weeks.  She is agreeable to proceed at this time.    2.  IV access: Port-A-Cath remains in use without any issues.   3.  Antiemetics: No nausea or vomiting reported at this time.  Compazine is available to her   4.  Renal function surveillance: She has hydronephrosis and chronic renal insufficiency with kidney function remained stable.   5.  Goals of care: Her disease is incurable although aggressive measures are warranted.   6.  Anemia: Related to malignancy and chemotherapy.  She will be transfused as needed.  Her hemoglobin is adequate and does not require any intervention.  7.  Neutropenia: Resolved at this time after discontinuation of oxaliplatin.  8.  Thrombocytopenia: Continues to be mild and asymptomatic.  This is related to chemotherapy.   9.  Follow-up: She will return in 2 weeks for the next cycle of therapy.   30  minutes were dedicated to this encounter.  The time was spent on reviewing laboratory data, disease status update and outlining future plan of care.   Zola Button, MD 12/22/202211:19 AM

## 2021-12-04 NOTE — Patient Instructions (Signed)
[facility] ONCOLOGY  Discharge Instructions: Thank you for choosing [facility] to provide your oncology and hematology care.   If you have a lab appointment with the [facility], please go directly to the [facility] and check in at the registration area.   Wear comfortable clothing and clothing appropriate for easy access to any Portacath or PICC line.   We strive to give you quality time with your provider. You may need to reschedule your appointment if you arrive late (15 or more minutes).  Arriving late affects you and other patients whose appointments are after yours.  Also, if you miss three or more appointments without notifying the office, you may be dismissed from the clinic at the providers discretion.      For prescription refill requests, have your pharmacy contact our office and allow 72 hours for refills to be completed.    Today you received the following chemotherapy and/or immunotherapy agents: Leucovorin/ 5FU      To help prevent nausea and vomiting after your treatment, we encourage you to take your nausea medication as directed.  BELOW ARE SYMPTOMS THAT SHOULD BE REPORTED IMMEDIATELY: *FEVER GREATER THAN 100.4 F (38 C) OR HIGHER *CHILLS OR SWEATING *NAUSEA AND VOMITING THAT IS NOT CONTROLLED WITH YOUR NAUSEA MEDICATION *UNUSUAL SHORTNESS OF BREATH *UNUSUAL BRUISING OR BLEEDING *URINARY PROBLEMS (pain or burning when urinating, or frequent urination) *BOWEL PROBLEMS (unusual diarrhea, constipation, pain near the anus) TENDERNESS IN MOUTH AND THROAT WITH OR WITHOUT PRESENCE OF ULCERS (sore throat, sores in mouth, or a toothache) UNUSUAL RASH, SWELLING OR PAIN  UNUSUAL VAGINAL DISCHARGE OR ITCHING   Items with * indicate a potential emergency and should be followed up as soon as possible or go to the Emergency Department if any problems should occur.  Please show the CHEMOTHERAPY ALERT CARD or IMMUNOTHERAPY ALERT CARD at  check-in to the Emergency Department and triage nurse.  Should you have questions after your visit or need to cancel or reschedule your appointment, please contact [facility]  Dept: [phone]  and follow the prompts.  Office hours are 8:00 a.m. to 4:30 p.m. Monday - Friday. Please note that voicemails left after 4:00 p.m. may not be returned until the following business day.  We are closed weekends and major holidays. You have access to a nurse at all times for urgent questions. Please call the main number to the clinic Dept: [phone] and follow the prompts.   For any non-urgent questions, you may also contact your provider using MyChart. We now offer e-Visits for anyone 67 and older to request care online for non-urgent symptoms. For details visit [email or web address].   Also download the MyChart app! Go to the app store, search "MyChart", open the app, select [facility], and log in with your MyChart username and password.  Due to Covid, a mask is required upon entering the hospital/clinic. If you do not have a mask, one will be given to you upon arrival. For doctor visits, patients may have 1 support person aged 89 or older with them. For treatment visits, patients cannot have anyone with them due to current Covid guidelines and our immunocompromised population.

## 2021-12-06 ENCOUNTER — Inpatient Hospital Stay: Payer: No Typology Code available for payment source

## 2021-12-06 ENCOUNTER — Other Ambulatory Visit: Payer: Self-pay

## 2021-12-06 VITALS — BP 146/76 | HR 69 | Temp 97.5°F

## 2021-12-06 DIAGNOSIS — Z95828 Presence of other vascular implants and grafts: Secondary | ICD-10-CM

## 2021-12-06 DIAGNOSIS — Z5111 Encounter for antineoplastic chemotherapy: Secondary | ICD-10-CM | POA: Diagnosis not present

## 2021-12-06 DIAGNOSIS — C801 Malignant (primary) neoplasm, unspecified: Secondary | ICD-10-CM

## 2021-12-06 MED ORDER — HEPARIN SOD (PORK) LOCK FLUSH 100 UNIT/ML IV SOLN
500.0000 [IU] | Freq: Once | INTRAVENOUS | Status: AC
  Administered 2021-12-06: 12:00:00 500 [IU]

## 2021-12-06 MED ORDER — SODIUM CHLORIDE 0.9% FLUSH
10.0000 mL | Freq: Once | INTRAVENOUS | Status: AC
  Administered 2021-12-06: 12:00:00 10 mL

## 2021-12-16 ENCOUNTER — Other Ambulatory Visit (HOSPITAL_COMMUNITY): Payer: Self-pay

## 2021-12-16 ENCOUNTER — Other Ambulatory Visit: Payer: Self-pay

## 2021-12-16 ENCOUNTER — Ambulatory Visit: Payer: No Typology Code available for payment source | Admitting: Medical-Surgical

## 2021-12-16 ENCOUNTER — Encounter: Payer: Self-pay | Admitting: Medical-Surgical

## 2021-12-16 ENCOUNTER — Ambulatory Visit (INDEPENDENT_AMBULATORY_CARE_PROVIDER_SITE_OTHER): Payer: No Typology Code available for payment source | Admitting: Medical-Surgical

## 2021-12-16 VITALS — BP 147/90 | HR 69 | Resp 20 | Ht 66.0 in | Wt 171.0 lb

## 2021-12-16 DIAGNOSIS — F410 Panic disorder [episodic paroxysmal anxiety] without agoraphobia: Secondary | ICD-10-CM | POA: Diagnosis not present

## 2021-12-16 DIAGNOSIS — I10 Essential (primary) hypertension: Secondary | ICD-10-CM

## 2021-12-16 DIAGNOSIS — E785 Hyperlipidemia, unspecified: Secondary | ICD-10-CM | POA: Diagnosis not present

## 2021-12-16 DIAGNOSIS — F418 Other specified anxiety disorders: Secondary | ICD-10-CM | POA: Diagnosis not present

## 2021-12-16 MED ORDER — ESCITALOPRAM OXALATE 5 MG PO TABS
5.0000 mg | ORAL_TABLET | Freq: Every day | ORAL | 3 refills | Status: DC
  Filled 2021-12-16: qty 30, 30d supply, fill #0

## 2021-12-16 NOTE — Progress Notes (Signed)
°  HPI with pertinent ROS:   CC: 3 month follow up  HPI: Pleasant 66 year old female presenting today for 3 month follow up on:  HTN- taking Nebivolol 10mg  daily and amlodipine 5mg  daily prn. Notes that her BP was elevated this morning due to anxiety about having a pap smear today. Took an amlodipine prior to her arrival. Denies CP, SOB, palpitations, lower extremity edema, dizziness, headaches, or vision changes.   Mood- having a really hard time these days with anxiety, especially regarding health. Has started having panic attacks, often while driving, without an obvious trigger. Finds herself almost frozen, unable to turn the wheel and holding the wheel in a white-knuckled grip. Has pulled out in front of a dark colored car in the rain a couple of times. Is so scared at this point that she has stopped driving unless absolutely necessary. Taking Abilify 10mg  daily and has been on this about 10 years. Also taking Wellbutrin XL 300mg  daily. Using Klonopin 0.5mg  at bedtime but has had to take this a few times during the day to manage panic. Not currently taking an SSRI/SNRI or TCA. Denies SI/HI.   GERD- managed by omeprazole 20mg  daily.   HLD- taking Zocor 20mg  daily, tolerating well without side effects.   I reviewed the past medical history, family history, social history, surgical history, and allergies today and no changes were needed.  Please see the problem list section below in epic for further details.  Physical exam:   General: Well Developed, well nourished, and in no acute distress.  Neuro: Alert and oriented x3.  HEENT: Normocephalic, atraumatic.  Skin: Warm and dry. Cardiac: Regular rate and rhythm, no murmurs rubs or gallops, no lower extremity edema.  Respiratory: Clear to auscultation bilaterally. Not using accessory muscles, speaking in full sentences.  Impression and Recommendations:    1. Anxiety about health 2. Panic attack Starting Lexapro 5mg  daily. Continue  Wellbutrin 300mg  daily. Continue prn Klonopin but aim to use only for severe anxiety rather than sleep.  - escitalopram (LEXAPRO) 5 MG tablet; Take 1 tablet by mouth daily.  Dispense: 30 tablet; Refill: 3  3. Essential hypertension with goal blood pressure less than 130/80 BP slightly above goal but feel this has more to do with anxiety and overall stress. Working to get better control of mental health concerns. Continue Nebivolol daily. Continue Amlodipine daily prn. Monitor BP at home with goal of 130/80 or less. If consistently higher, may need to consider using amlodipine daily rather than as needed.   4. Dyslipidemia, goal LDL below 100 Continue Zocor. Plan to check lipid panel if it can be added to blood work done at the cancer center.   Return in about 4 weeks (around 01/13/2022) for mood follow up. ___________________________________________ [name], DNP, APRN, FNP-BC [facility]

## 2021-12-17 ENCOUNTER — Other Ambulatory Visit (HOSPITAL_COMMUNITY): Payer: Self-pay

## 2021-12-17 ENCOUNTER — Other Ambulatory Visit: Payer: Self-pay | Admitting: Physician Assistant

## 2021-12-18 ENCOUNTER — Inpatient Hospital Stay: Payer: No Typology Code available for payment source | Attending: Oncology

## 2021-12-18 ENCOUNTER — Other Ambulatory Visit: Payer: Self-pay

## 2021-12-18 ENCOUNTER — Inpatient Hospital Stay (HOSPITAL_BASED_OUTPATIENT_CLINIC_OR_DEPARTMENT_OTHER): Payer: No Typology Code available for payment source | Admitting: Oncology

## 2021-12-18 ENCOUNTER — Inpatient Hospital Stay: Payer: No Typology Code available for payment source

## 2021-12-18 ENCOUNTER — Other Ambulatory Visit (HOSPITAL_COMMUNITY): Payer: Self-pay

## 2021-12-18 VITALS — BP 140/90 | HR 70 | Temp 97.8°F | Resp 17 | Wt 178.5 lb

## 2021-12-18 DIAGNOSIS — C801 Malignant (primary) neoplasm, unspecified: Secondary | ICD-10-CM | POA: Diagnosis not present

## 2021-12-18 DIAGNOSIS — Z5111 Encounter for antineoplastic chemotherapy: Secondary | ICD-10-CM | POA: Insufficient documentation

## 2021-12-18 DIAGNOSIS — R5383 Other fatigue: Secondary | ICD-10-CM | POA: Diagnosis not present

## 2021-12-18 DIAGNOSIS — C772 Secondary and unspecified malignant neoplasm of intra-abdominal lymph nodes: Secondary | ICD-10-CM | POA: Diagnosis not present

## 2021-12-18 DIAGNOSIS — T451X5D Adverse effect of antineoplastic and immunosuppressive drugs, subsequent encounter: Secondary | ICD-10-CM | POA: Insufficient documentation

## 2021-12-18 DIAGNOSIS — C669 Malignant neoplasm of unspecified ureter: Secondary | ICD-10-CM

## 2021-12-18 DIAGNOSIS — D61810 Antineoplastic chemotherapy induced pancytopenia: Secondary | ICD-10-CM | POA: Diagnosis not present

## 2021-12-18 DIAGNOSIS — I10 Essential (primary) hypertension: Secondary | ICD-10-CM

## 2021-12-18 DIAGNOSIS — D630 Anemia in neoplastic disease: Secondary | ICD-10-CM | POA: Diagnosis present

## 2021-12-18 DIAGNOSIS — F418 Other specified anxiety disorders: Secondary | ICD-10-CM

## 2021-12-18 DIAGNOSIS — Z95828 Presence of other vascular implants and grafts: Secondary | ICD-10-CM

## 2021-12-18 DIAGNOSIS — Z79899 Other long term (current) drug therapy: Secondary | ICD-10-CM | POA: Diagnosis not present

## 2021-12-18 DIAGNOSIS — G629 Polyneuropathy, unspecified: Secondary | ICD-10-CM | POA: Insufficient documentation

## 2021-12-18 DIAGNOSIS — F410 Panic disorder [episodic paroxysmal anxiety] without agoraphobia: Secondary | ICD-10-CM

## 2021-12-18 LAB — CMP (CANCER CENTER ONLY)
ALT: 9 U/L (ref 0–44)
AST: 14 U/L — ABNORMAL LOW (ref 15–41)
Albumin: 4 g/dL (ref 3.5–5.0)
Alkaline Phosphatase: 102 U/L (ref 38–126)
Anion gap: 8 (ref 5–15)
BUN: 18 mg/dL (ref 8–23)
CO2: 25 mmol/L (ref 22–32)
Calcium: 9.1 mg/dL (ref 8.9–10.3)
Chloride: 104 mmol/L (ref 98–111)
Creatinine: 1.2 mg/dL — ABNORMAL HIGH (ref 0.44–1.00)
GFR, Estimated: 51 mL/min — ABNORMAL LOW (ref >60)
Glucose, Bld: 103 mg/dL — ABNORMAL HIGH (ref 70–99)
Potassium: 4.2 mmol/L (ref 3.5–5.1)
Sodium: 137 mmol/L (ref 135–145)
Total Bilirubin: 0.3 mg/dL (ref 0.3–1.2)
Total Protein: 7.3 g/dL (ref 6.5–8.1)

## 2021-12-18 LAB — CBC WITH DIFFERENTIAL (CANCER CENTER ONLY)
Abs Immature Granulocytes: 0.01 10*3/uL (ref 0.00–0.07)
Basophils Absolute: 0 10*3/uL (ref 0.0–0.1)
Basophils Relative: 1 %
Eosinophils Absolute: 0.2 10*3/uL (ref 0.0–0.5)
Eosinophils Relative: 3 %
HCT: 28.9 % — ABNORMAL LOW (ref 36.0–46.0)
Hemoglobin: 9.8 g/dL — ABNORMAL LOW (ref 12.0–15.0)
Immature Granulocytes: 0 %
Lymphocytes Relative: 31 %
Lymphs Abs: 1.7 10*3/uL (ref 0.7–4.0)
MCH: 38.1 pg — ABNORMAL HIGH (ref 26.0–34.0)
MCHC: 33.9 g/dL (ref 30.0–36.0)
MCV: 112.5 fL — ABNORMAL HIGH (ref 80.0–100.0)
Monocytes Absolute: 0.9 10*3/uL (ref 0.1–1.0)
Monocytes Relative: 17 %
Neutro Abs: 2.7 10*3/uL (ref 1.7–7.7)
Neutrophils Relative %: 48 %
Platelet Count: 114 10*3/uL — ABNORMAL LOW (ref 150–400)
RBC: 2.57 MIL/uL — ABNORMAL LOW (ref 3.87–5.11)
RDW: 15.7 % — ABNORMAL HIGH (ref 11.5–15.5)
WBC Count: 5.5 10*3/uL (ref 4.0–10.5)
nRBC: 0 % (ref 0.0–0.2)

## 2021-12-18 LAB — SAMPLE TO BLOOD BANK

## 2021-12-18 MED ORDER — FLUOROURACIL CHEMO INJECTION 2.5 GM/50ML
400.0000 mg/m2 | Freq: Once | INTRAVENOUS | Status: AC
  Administered 2021-12-18: 800 mg via INTRAVENOUS
  Filled 2021-12-18: qty 16

## 2021-12-18 MED ORDER — SODIUM CHLORIDE 0.9 % IV SOLN
2400.0000 mg/m2 | INTRAVENOUS | Status: DC
  Administered 2021-12-18: 4800 mg via INTRAVENOUS
  Filled 2021-12-18: qty 96

## 2021-12-18 MED ORDER — CEPHALEXIN 250 MG PO CAPS
ORAL_CAPSULE | ORAL | 1 refills | Status: DC
  Filled 2021-12-18: qty 5, 5d supply, fill #0
  Filled 2021-12-18: qty 85, 85d supply, fill #0
  Filled 2022-04-10: qty 90, 90d supply, fill #1

## 2021-12-18 MED ORDER — LEUCOVORIN CALCIUM INJECTION 350 MG
400.0000 mg/m2 | Freq: Once | INTRAVENOUS | Status: AC
  Administered 2021-12-18: 804 mg via INTRAVENOUS
  Filled 2021-12-18: qty 25

## 2021-12-18 MED ORDER — ONDANSETRON HCL 4 MG/2ML IJ SOLN
8.0000 mg | Freq: Once | INTRAMUSCULAR | Status: AC
  Administered 2021-12-18: 8 mg via INTRAVENOUS
  Filled 2021-12-18: qty 4

## 2021-12-18 MED ORDER — ARIPIPRAZOLE 10 MG PO TABS
ORAL_TABLET | Freq: Every day | ORAL | 0 refills | Status: DC
  Filled 2021-12-18: qty 90, 90d supply, fill #0

## 2021-12-18 MED ORDER — DEXTROSE 5 % IV SOLN: Freq: Once | INTRAVENOUS | Status: AC

## 2021-12-18 MED ORDER — SODIUM CHLORIDE 0.9% FLUSH
10.0000 mL | Freq: Once | INTRAVENOUS | Status: AC
  Administered 2021-12-18: 10 mL

## 2021-12-18 MED ORDER — ESCITALOPRAM OXALATE 5 MG PO TABS
5.0000 mg | ORAL_TABLET | Freq: Every day | ORAL | 3 refills | Status: DC
  Filled 2021-12-18 – 2022-01-14 (×2): qty 30, 30d supply, fill #0
  Filled 2022-02-10: qty 30, 30d supply, fill #1
  Filled 2022-03-19: qty 30, 30d supply, fill #2

## 2021-12-18 NOTE — Progress Notes (Signed)
Denies anyHematology and Oncology Follow Up Visit  Mary Stark [id number] 1956-06-24 66 y.o. 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     Prior Therapy:  She is status post retroperitoneal lymph node biopsy completed on 10/24/2020 which showed metastatic carcinoma.  She is status post repeat cystoscopy and bilateral ureteral stent exchange as well as resection of a bladder tumor on January 21, 2021.  A final pathology showed an adenocarcinoma.  Chemotherapy utilizing carboplatin and gemcitabine started on November 22, 2020.  She completed 6 cycles of therapy on March 06, 2021.  FOLFOX chemotherapy started on Apr 15, 2021.  She completed 12 cycles of therapy.  Current therapy: 5-FU and leucovorin chemotherapy every 2 weeks started in November 2022.  She is here for the next cycle of therapy.   Interim History: Ms. Kube is here for a follow-up visit.  Since her last visit, she reports feeling well without any major complaints.  She denies any nausea, vomiting or abdominal pain.  She denies any worsening neuropathy or excessive fatigue.  She does have an element of anxiety associated with driving and currently on Zoloft.  She denies any worsening shortness of breath or dyspnea on exertion.     Medications: Updated on review. Current Outpatient Medications  Medication Sig Dispense Refill   acetaminophen (TYLENOL) 500 MG tablet Take 625 mg by mouth every 8 (eight) hours as needed for moderate pain.     albuterol (PROAIR HFA) 108 (90 Base) MCG/ACT inhaler INHALE 1 TO 2 PUFFS BY MOUTH INTO THE LUNGS EVERY 4 HOURS AS NEEDED FOR WHEEZING OR SHORTNESS OF BREATH 8 g 1   amLODipine (NORVASC) 5 MG tablet Take 5 mg by mouth as needed.     ARIPiprazole (ABILIFY) 10 MG tablet TAKE 1 TABLET BY MOUTH ONCE DAILY (Patient taking  differently: Take 10 mg by mouth daily.) 90 tablet 0   ascorbic acid (VITAMIN C) 500 MG tablet Take 1,000 mg by mouth every evening.      benzonatate (TESSALON) 200 MG capsule Take 1 capsule (200 mg total) by mouth 3 (three) times daily as needed for cough. 30 capsule 0   buPROPion (WELLBUTRIN XL) 300 MG 24 hr tablet TAKE 1 TABLET BY MOUTH EVERY DAY (Patient taking differently: Take 300 mg by mouth daily.) 30 tablet 3   cephALEXin (KEFLEX) 250 MG capsule TAKE ONE CAPSULE BY MOUTH NIGHTLY 90 capsule 1   Cholecalciferol (VITAMIN D) 125 MCG (5000 UT) CAPS Take 5,000 Units by mouth every evening.      clonazePAM (KLONOPIN) 0.5 MG tablet TAKE 1 TABLET BY MOUTH AT BEDTIME 30 tablet 1   Coenzyme Q10 (CO Q-10) 200 MG CAPS Take 200 mg by mouth every evening.      cycloSPORINE (RESTASIS) 0.05 % ophthalmic emulsion Place 1 drop both eyes twice a day 180 each 3   escitalopram (LEXAPRO) 5 MG tablet Take 1 tablet by mouth daily. 30 tablet 3   fluticasone-salmeterol (ADVAIR HFA) 115-21 MCG/ACT inhaler Inhale 2 puffs into the lungs 2 (two) times daily. (Patient taking differently: Inhale 2 puffs into the lungs 2 (two) times daily. Havent had it filled) 1 each 0   gabapentin (NEURONTIN) 300 MG capsule TAKE 1 CAPSULE BY MOUTH IN THE MORNING, 1 CAPSULE IN THE AFTERNOON & 4 CAPSULES AT NIGHT (Patient taking differently: Take 300 mg by mouth 4 (four) times  daily.) 180 capsule 3   GLUCOSAMINE-CHONDROITIN PO Take 2 tablets by mouth every evening.      lidocaine-prilocaine (EMLA) cream Apply 1 application topically as needed. 30 g 0   Meth-Hyo-M Bl-Na Phos-Ph Sal (URIBEL) 118 MG CAPS Take one capsule by mouth 3 times daily (Patient taking differently: as needed.) 30 capsule 3   mirabegron ER (MYRBETRIQ) 25 MG TB24 tablet TAKE 1 TABLET BY MOUTH ONCE A DAY 30 tablet 3   Nebivolol HCl 20 MG TABS Take 1/2 tablet (10 mg total) by mouth daily. 90 tablet 1   nitrofurantoin, macrocrystal-monohydrate, (MACROBID) 100 MG capsule Take  1 capsule (100 mg total) by mouth 2 (two) times daily. 10 capsule 0   Omega-3 Fatty Acids (FISH OIL) 1200 MG CAPS Take 1,200 mg by mouth every evening.      omeprazole (PRILOSEC) 20 MG capsule Take 1 capsule (20 mg total) by mouth daily. 90 capsule 3   ondansetron (ZOFRAN) 4 MG tablet Take 1 tablet (4 mg total) by mouth every 8 (eight) hours as needed for nausea or vomiting. 40 tablet 3   oxybutynin (DITROPAN) 5 MG tablet TAKE 1 TABLET BY MOUTH EVERY 8 HOURS AS NEEDED 90 tablet 3   oxymetazoline (AFRIN) 0.05 % nasal spray Place 1 spray into both nostrils 2 (two) times daily as needed for congestion. For nose bleeds     Phenazopyridine HCl (AZO URINARY PAIN PO) Take 2 tablets by mouth 3 (three) times daily as needed (bladder spasms).     simvastatin (ZOCOR) 20 MG tablet TAKE 1 TABLET (20 MG TOTAL) BY MOUTH DAILY. (Patient taking differently: Take 20 mg by mouth at bedtime.) 90 tablet 3   tamsulosin (FLOMAX) 0.4 MG CAPS capsule TAKE 1 CAPSULE BY MOUTH AT BEDTIME 30 capsule 3   thiamine 100 MG tablet Take 1 tablet (100 mg total) by mouth daily. 30 tablet 0   traMADol (ULTRAM) 50 MG tablet Take 1 tablet (50 mg total) by mouth 3 (three) times daily as needed. 30 tablet 3   vitamin B-12 (CYANOCOBALAMIN) 1000 MCG tablet Take 2,000 mcg by mouth every evening.      Vitamin E 180 MG (400 UNIT) CAPS Take 400 Units by mouth every evening.      No current facility-administered medications for this visit.     Allergies:  Allergies  Allergen Reactions   Metformin And Related Other (See Comments)    Lactic acidosis    Penicillins Hives and Rash    Reports hives to penicillin and amoxicillin as an adult "a long time ago." Has tolerated cefdinir (09/2020) and cefepime (10/2020) before.   Metformin     NDC Code:00093721201 NDC FMBW:[id number]   Penicillin G Sodium     NDC XBLT:90300923300 NDC TMAU:[id number] NDC LSLH:[id number]      Physical Exam:   Blood pressure 140/90, pulse 70, temperature  97.8 F (36.6 C), resp. rate 17, weight 178 lb 8 oz (81 kg), SpO2 100 %.     ECOG: 1    General appearance: Comfortable appearing without any discomfort Head: Normocephalic without any trauma Oropharynx: Mucous membranes are moist and pink without any thrush or ulcers. Eyes: Pupils are equal and round reactive to light. Lymph nodes: No cervical, supraclavicular, inguinal or axillary lymphadenopathy.   Heart:regular rate and rhythm.  S1 and S2 without leg edema. Lung: Clear without any rhonchi or wheezes.  No dullness to percussion. Abdomin: Soft, nontender, nondistended with good bowel sounds.  No hepatosplenomegaly. Musculoskeletal: No joint deformity or effusion.  Full range of motion noted. Neurological: No deficits noted on motor, sensory and deep tendon reflex exam. Skin: No petechial rash or dryness.  Appeared moist.                    Lab Results: Lab Results  Component Value Date   WBC 5.0 12/04/2021   HGB 9.6 (L) 12/04/2021   HCT 28.7 (L) 12/04/2021   MCV 112.5 (H) 12/04/2021   PLT 129 (L) 12/04/2021     Chemistry      Component Value Date/Time   NA 136 12/04/2021 1124   NA 142 04/26/2018 1333   K 4.7 12/04/2021 1124   CL 103 12/04/2021 1124   CO2 25 12/04/2021 1124   BUN 21 12/04/2021 1124   BUN 22 04/26/2018 1333   CREATININE 1.27 (H) 12/04/2021 1124   CREATININE 1.32 (H) 06/04/2021 0000   GLU 87 02/07/2013 0000      Component Value Date/Time   CALCIUM 8.9 12/04/2021 1124   ALKPHOS 97 12/04/2021 1124   AST 13 (L) 12/04/2021 1124   ALT 8 12/04/2021 1124   BILITOT 0.5 12/04/2021 1124       Impression and Plan:    66 year old woman with:   1.   Bladder cancer diagnosed in 2021.  She presented with stage IV adenocarcinoma and pelvic adenopathy.  She had an excellent response to FOLFOX chemotherapy and currently on maintenance 5-FU leucovorin.  Risks and benefits of continuing this treatment were reviewed at this time.  Potential  complications that include nausea, fatigue and neuropathy were reiterated.  She is agreeable to continue and we will update her staging scan in 4 weeks.  Different salvage therapy options will be considered at that time if she has progression of disease.  She is agreeable to continue at this time.    2.  IV access: Port-A-Cath continues to be in place without any issues.   3.  Antiemetics: Compazine is available to her without any nausea or vomiting.   4.  Renal function surveillance: She has bilateral ureteral stents related to malignancy.  Kidney function remained stable.   5.  Goals of care: Her disease is incurable although aggressive measures are warranted.   6.  Anemia: Her hemoglobin was 9.6 on December 22 and will continue to monitor and transfuse as needed.  Her anemia is related to malignancy and chemotherapy.  7.  Thrombocytopenia: Related to oxaliplatin chemotherapy and continues to improve off treatment.   8.  Follow-up: In 2 weeks for repeat evaluation of the next cycle of treatment.   30  minutes were spent on this visit.  The time was dedicated to updating her disease status, reviewing laboratory data, assessing complications related to cancer and cancer therapy and determine best course of action for the future.   Zola Button, MD 1/5/20238:24 AM

## 2021-12-18 NOTE — Patient Instructions (Signed)
[facility] ONCOLOGY  Discharge Instructions: Thank you for choosing [facility] to provide your oncology and hematology care.   If you have a lab appointment with the [facility], please go directly to the [facility] and check in at the registration area.   Wear comfortable clothing and clothing appropriate for easy access to any Portacath or PICC line.   We strive to give you quality time with your provider. You may need to reschedule your appointment if you arrive late (15 or more minutes).  Arriving late affects you and other patients whose appointments are after yours.  Also, if you miss three or more appointments without notifying the office, you may be dismissed from the clinic at the providers discretion.      For prescription refill requests, have your pharmacy contact our office and allow 72 hours for refills to be completed.    Today you received the following chemotherapy and/or immunotherapy agents: Fluorouracil      To help prevent nausea and vomiting after your treatment, we encourage you to take your nausea medication as directed.  BELOW ARE SYMPTOMS THAT SHOULD BE REPORTED IMMEDIATELY: *FEVER GREATER THAN 100.4 F (38 C) OR HIGHER *CHILLS OR SWEATING *NAUSEA AND VOMITING THAT IS NOT CONTROLLED WITH YOUR NAUSEA MEDICATION *UNUSUAL SHORTNESS OF BREATH *UNUSUAL BRUISING OR BLEEDING *URINARY PROBLEMS (pain or burning when urinating, or frequent urination) *BOWEL PROBLEMS (unusual diarrhea, constipation, pain near the anus) TENDERNESS IN MOUTH AND THROAT WITH OR WITHOUT PRESENCE OF ULCERS (sore throat, sores in mouth, or a toothache) UNUSUAL RASH, SWELLING OR PAIN  UNUSUAL VAGINAL DISCHARGE OR ITCHING   Items with * indicate a potential emergency and should be followed up as soon as possible or go to the Emergency Department if any problems should occur.  Please show the CHEMOTHERAPY ALERT CARD or IMMUNOTHERAPY ALERT CARD at check-in  to the Emergency Department and triage nurse.  Should you have questions after your visit or need to cancel or reschedule your appointment, please contact [facility]  Dept: [phone]  and follow the prompts.  Office hours are 8:00 a.m. to 4:30 p.m. Monday - Friday. Please note that voicemails left after 4:00 p.m. may not be returned until the following business day.  We are closed weekends and major holidays. You have access to a nurse at all times for urgent questions. Please call the main number to the clinic Dept: [phone] and follow the prompts.   For any non-urgent questions, you may also contact your provider using MyChart. We now offer e-Visits for anyone 63 and older to request care online for non-urgent symptoms. For details visit [email or web address].   Also download the MyChart app! Go to the app store, search "MyChart", open the app, select [facility], and log in with your MyChart username and password.  Due to Covid, a mask is required upon entering the hospital/clinic. If you do not have a mask, one will be given to you upon arrival. For doctor visits, patients may have 1 support person aged 28 or older with them. For treatment visits, patients cannot have anyone with them due to current Covid guidelines and our immunocompromised population.

## 2021-12-19 ENCOUNTER — Other Ambulatory Visit (HOSPITAL_COMMUNITY): Payer: Self-pay

## 2021-12-19 ENCOUNTER — Telehealth: Payer: Self-pay | Admitting: Oncology

## 2021-12-19 NOTE — Telephone Encounter (Signed)
Scheduled per 01/05 los, patient has been called and voicemail was left.

## 2021-12-20 ENCOUNTER — Inpatient Hospital Stay: Payer: No Typology Code available for payment source

## 2021-12-20 ENCOUNTER — Other Ambulatory Visit: Payer: Self-pay

## 2021-12-20 VITALS — BP 142/91 | HR 74 | Temp 97.5°F | Resp 16

## 2021-12-20 DIAGNOSIS — Z5111 Encounter for antineoplastic chemotherapy: Secondary | ICD-10-CM | POA: Diagnosis not present

## 2021-12-20 DIAGNOSIS — C669 Malignant neoplasm of unspecified ureter: Secondary | ICD-10-CM

## 2021-12-20 DIAGNOSIS — C801 Malignant (primary) neoplasm, unspecified: Secondary | ICD-10-CM

## 2021-12-20 MED ORDER — SODIUM CHLORIDE 0.9% FLUSH
10.0000 mL | INTRAVENOUS | Status: DC | PRN
  Administered 2021-12-20: 10 mL

## 2021-12-20 MED ORDER — HEPARIN SOD (PORK) LOCK FLUSH 100 UNIT/ML IV SOLN
500.0000 [IU] | Freq: Once | INTRAVENOUS | Status: AC | PRN
  Administered 2021-12-20: 500 [IU]

## 2021-12-25 ENCOUNTER — Other Ambulatory Visit: Payer: Self-pay | Admitting: Osteopathic Medicine

## 2021-12-25 ENCOUNTER — Other Ambulatory Visit (HOSPITAL_COMMUNITY): Payer: Self-pay

## 2021-12-26 ENCOUNTER — Other Ambulatory Visit (HOSPITAL_COMMUNITY): Payer: Self-pay

## 2021-12-26 MED ORDER — BUPROPION HCL ER (XL) 300 MG PO TB24
ORAL_TABLET | Freq: Every day | ORAL | 3 refills | Status: DC
  Filled 2021-12-26: qty 30, 30d supply, fill #0
  Filled 2022-01-26: qty 30, 30d supply, fill #1
  Filled 2022-02-27: qty 30, 30d supply, fill #2
  Filled 2022-03-30: qty 30, 30d supply, fill #3

## 2021-12-29 ENCOUNTER — Other Ambulatory Visit (HOSPITAL_COMMUNITY): Payer: Self-pay

## 2022-01-01 ENCOUNTER — Inpatient Hospital Stay: Payer: No Typology Code available for payment source

## 2022-01-01 ENCOUNTER — Inpatient Hospital Stay (HOSPITAL_BASED_OUTPATIENT_CLINIC_OR_DEPARTMENT_OTHER): Payer: No Typology Code available for payment source | Admitting: Oncology

## 2022-01-01 ENCOUNTER — Other Ambulatory Visit: Payer: Self-pay

## 2022-01-01 VITALS — BP 131/88 | HR 79 | Temp 97.6°F | Resp 17 | Ht 66.0 in | Wt 176.8 lb

## 2022-01-01 DIAGNOSIS — C669 Malignant neoplasm of unspecified ureter: Secondary | ICD-10-CM

## 2022-01-01 DIAGNOSIS — Z5111 Encounter for antineoplastic chemotherapy: Secondary | ICD-10-CM | POA: Diagnosis not present

## 2022-01-01 DIAGNOSIS — C801 Malignant (primary) neoplasm, unspecified: Secondary | ICD-10-CM

## 2022-01-01 DIAGNOSIS — Z95828 Presence of other vascular implants and grafts: Secondary | ICD-10-CM

## 2022-01-01 LAB — CBC WITH DIFFERENTIAL (CANCER CENTER ONLY)
Abs Immature Granulocytes: 0.02 10*3/uL (ref 0.00–0.07)
Basophils Absolute: 0 10*3/uL (ref 0.0–0.1)
Basophils Relative: 0 %
Eosinophils Absolute: 0.1 10*3/uL (ref 0.0–0.5)
Eosinophils Relative: 1 %
HCT: 28.2 % — ABNORMAL LOW (ref 36.0–46.0)
Hemoglobin: 9.4 g/dL — ABNORMAL LOW (ref 12.0–15.0)
Immature Granulocytes: 0 %
Lymphocytes Relative: 19 %
Lymphs Abs: 1.4 10*3/uL (ref 0.7–4.0)
MCH: 37.9 pg — ABNORMAL HIGH (ref 26.0–34.0)
MCHC: 33.3 g/dL (ref 30.0–36.0)
MCV: 113.7 fL — ABNORMAL HIGH (ref 80.0–100.0)
Monocytes Absolute: 1 10*3/uL (ref 0.1–1.0)
Monocytes Relative: 14 %
Neutro Abs: 4.6 10*3/uL (ref 1.7–7.7)
Neutrophils Relative %: 66 %
Platelet Count: 135 10*3/uL — ABNORMAL LOW (ref 150–400)
RBC: 2.48 MIL/uL — ABNORMAL LOW (ref 3.87–5.11)
RDW: 15.6 % — ABNORMAL HIGH (ref 11.5–15.5)
WBC Count: 7.1 10*3/uL (ref 4.0–10.5)
nRBC: 0 % (ref 0.0–0.2)

## 2022-01-01 LAB — CMP (CANCER CENTER ONLY)
ALT: 7 U/L (ref 0–44)
AST: 11 U/L — ABNORMAL LOW (ref 15–41)
Albumin: 4.1 g/dL (ref 3.5–5.0)
Alkaline Phosphatase: 99 U/L (ref 38–126)
Anion gap: 7 (ref 5–15)
BUN: 20 mg/dL (ref 8–23)
CO2: 25 mmol/L (ref 22–32)
Calcium: 9 mg/dL (ref 8.9–10.3)
Chloride: 104 mmol/L (ref 98–111)
Creatinine: 1.22 mg/dL — ABNORMAL HIGH (ref 0.44–1.00)
GFR, Estimated: 50 mL/min — ABNORMAL LOW (ref >60)
Glucose, Bld: 107 mg/dL — ABNORMAL HIGH (ref 70–99)
Potassium: 4.1 mmol/L (ref 3.5–5.1)
Sodium: 136 mmol/L (ref 135–145)
Total Bilirubin: 0.5 mg/dL (ref 0.3–1.2)
Total Protein: 7.3 g/dL (ref 6.5–8.1)

## 2022-01-01 LAB — SAMPLE TO BLOOD BANK

## 2022-01-01 MED ORDER — LEUCOVORIN CALCIUM INJECTION 350 MG
400.0000 mg/m2 | Freq: Once | INTRAVENOUS | Status: AC
  Administered 2022-01-01: 804 mg via INTRAVENOUS
  Filled 2022-01-01: qty 25

## 2022-01-01 MED ORDER — ONDANSETRON HCL 4 MG/2ML IJ SOLN
8.0000 mg | Freq: Once | INTRAMUSCULAR | Status: AC
  Administered 2022-01-01: 8 mg via INTRAVENOUS
  Filled 2022-01-01: qty 4

## 2022-01-01 MED ORDER — SODIUM CHLORIDE 0.9% FLUSH
10.0000 mL | Freq: Once | INTRAVENOUS | Status: AC
  Administered 2022-01-01: 10 mL

## 2022-01-01 MED ORDER — SODIUM CHLORIDE 0.9% FLUSH: 10.0000 mL | INTRAVENOUS | Status: DC | PRN

## 2022-01-01 MED ORDER — FLUOROURACIL CHEMO INJECTION 2.5 GM/50ML
400.0000 mg/m2 | Freq: Once | INTRAVENOUS | Status: AC
  Administered 2022-01-01: 800 mg via INTRAVENOUS
  Filled 2022-01-01: qty 16

## 2022-01-01 MED ORDER — SODIUM CHLORIDE 0.9 % IV SOLN
2400.0000 mg/m2 | INTRAVENOUS | Status: DC
  Administered 2022-01-01: 4800 mg via INTRAVENOUS
  Filled 2022-01-01: qty 96

## 2022-01-01 MED ORDER — DEXTROSE 5 % IV SOLN: Freq: Once | INTRAVENOUS | Status: AC

## 2022-01-01 NOTE — Patient Instructions (Signed)
[facility] ONCOLOGY  Discharge Instructions: Thank you for choosing [facility] to provide your oncology and hematology care.   If you have a lab appointment with the [facility], please go directly to the [facility] and check in at the registration area.   Wear comfortable clothing and clothing appropriate for easy access to any Portacath or PICC line.   We strive to give you quality time with your provider. You may need to reschedule your appointment if you arrive late (15 or more minutes).  Arriving late affects you and other patients whose appointments are after yours.  Also, if you miss three or more appointments without notifying the office, you may be dismissed from the clinic at the providers discretion.      For prescription refill requests, have your pharmacy contact our office and allow 72 hours for refills to be completed.    Today you received the following chemotherapy and/or immunotherapy agents: Fluorouracil      To help prevent nausea and vomiting after your treatment, we encourage you to take your nausea medication as directed.  BELOW ARE SYMPTOMS THAT SHOULD BE REPORTED IMMEDIATELY: *FEVER GREATER THAN 100.4 F (38 C) OR HIGHER *CHILLS OR SWEATING *NAUSEA AND VOMITING THAT IS NOT CONTROLLED WITH YOUR NAUSEA MEDICATION *UNUSUAL SHORTNESS OF BREATH *UNUSUAL BRUISING OR BLEEDING *URINARY PROBLEMS (pain or burning when urinating, or frequent urination) *BOWEL PROBLEMS (unusual diarrhea, constipation, pain near the anus) TENDERNESS IN MOUTH AND THROAT WITH OR WITHOUT PRESENCE OF ULCERS (sore throat, sores in mouth, or a toothache) UNUSUAL RASH, SWELLING OR PAIN  UNUSUAL VAGINAL DISCHARGE OR ITCHING   Items with * indicate a potential emergency and should be followed up as soon as possible or go to the Emergency Department if any problems should occur.  Please show the CHEMOTHERAPY ALERT CARD or IMMUNOTHERAPY ALERT CARD at check-in  to the Emergency Department and triage nurse.  Should you have questions after your visit or need to cancel or reschedule your appointment, please contact [facility]  Dept: [phone]  and follow the prompts.  Office hours are 8:00 a.m. to 4:30 p.m. Monday - Friday. Please note that voicemails left after 4:00 p.m. may not be returned until the following business day.  We are closed weekends and major holidays. You have access to a nurse at all times for urgent questions. Please call the main number to the clinic Dept: [phone] and follow the prompts.   For any non-urgent questions, you may also contact your provider using MyChart. We now offer e-Visits for anyone 76 and older to request care online for non-urgent symptoms. For details visit [email or web address].   Also download the MyChart app! Go to the app store, search "MyChart", open the app, select [facility], and log in with your MyChart username and password.  Due to Covid, a mask is required upon entering the hospital/clinic. If you do not have a mask, one will be given to you upon arrival. For doctor visits, patients may have 1 support person aged 81 or older with them. For treatment visits, patients cannot have anyone with them due to current Covid guidelines and our immunocompromised population.

## 2022-01-01 NOTE — Progress Notes (Signed)
Denies anyHematology and Oncology Follow Up Visit  Mary Stark [id number] 12/01/1956 66 y.o. 01/01/2022 7:55 AM Mary Stark, NPAlexander, [place], DO   [name]: 66 year old woman with bladder cancer diagnosed in November 2021.  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     Prior Therapy:  She is status post retroperitoneal lymph node biopsy completed on 10/24/2020 which showed metastatic carcinoma.  She is status post repeat cystoscopy and bilateral ureteral stent exchange as well as resection of a bladder tumor on January 21, 2021.  A final pathology showed an adenocarcinoma.  Chemotherapy utilizing carboplatin and gemcitabine started on November 22, 2020.  She completed 6 cycles of therapy on March 06, 2021.  FOLFOX chemotherapy started on Apr 15, 2021.  She completed 12 cycles of therapy.  Current therapy: 5-FU and leucovorin chemotherapy every 2 weeks started in November 2022.  She she returns today for the subsequent cycle of therapy.   Interim History: [name] presents today for a repeat evaluation.  Since the last visit, she reports no major changes in her health.  She continues to tolerate current chemotherapy without any major complaints.  She denies any nausea, vomiting or abdominal pain.  She denies any recent hospitalizations or illnesses.  Her neuropathy continues to improve.  She has reported some mild fatigue and tiredness.     Medications: Reviewed without changes. Current Outpatient Medications  Medication Sig Dispense Refill   ARIPiprazole (ABILIFY) 10 MG tablet TAKE 1 TABLET BY MOUTH ONCE DAILY 90 tablet 0   acetaminophen (TYLENOL) 500 MG tablet Take 625 mg by mouth every 8 (eight) hours as needed for moderate pain.     albuterol (PROAIR HFA) 108 (90 Base) MCG/ACT inhaler INHALE 1 TO 2 PUFFS BY MOUTH INTO THE LUNGS EVERY 4 HOURS AS NEEDED FOR WHEEZING OR SHORTNESS OF BREATH 8 g 1   amLODipine (NORVASC) 5  MG tablet Take 5 mg by mouth as needed.     ascorbic acid (VITAMIN C) 500 MG tablet Take 1,000 mg by mouth every evening.      benzonatate (TESSALON) 200 MG capsule Take 1 capsule (200 mg total) by mouth 3 (three) times daily as needed for cough. 30 capsule 0   buPROPion (WELLBUTRIN XL) 300 MG 24 hr tablet TAKE 1 TABLET BY MOUTH EVERY DAY 30 tablet 3   cephALEXin (KEFLEX) 250 MG capsule Take 1 capsule by mouth nightly as directed 90 capsule 1   Cholecalciferol (VITAMIN D) 125 MCG (5000 UT) CAPS Take 5,000 Units by mouth every evening.      clonazePAM (KLONOPIN) 0.5 MG tablet TAKE 1 TABLET BY MOUTH AT BEDTIME 30 tablet 1   Coenzyme Q10 (CO Q-10) 200 MG CAPS Take 200 mg by mouth every evening.      cycloSPORINE (RESTASIS) 0.05 % ophthalmic emulsion Place 1 drop both eyes twice a day 180 each 3   escitalopram (LEXAPRO) 5 MG tablet Take 1 tablet by mouth daily. 30 tablet 3   fluticasone-salmeterol (ADVAIR HFA) 115-21 MCG/ACT inhaler Inhale 2 puffs into the lungs 2 (two) times daily. (Patient taking differently: Inhale 2 puffs into the lungs 2 (two) times daily. Havent had it filled) 1 each 0   gabapentin (NEURONTIN) 300 MG capsule TAKE 1 CAPSULE BY MOUTH IN THE MORNING, 1 CAPSULE IN THE AFTERNOON & 4 CAPSULES AT NIGHT (Patient taking differently: Take 300 mg by mouth 4 (four) times daily.) 180 capsule 3   GLUCOSAMINE-CHONDROITIN PO Take 2 tablets by  mouth every evening.      lidocaine-prilocaine (EMLA) cream Apply 1 application topically as needed. 30 g 0   Meth-Hyo-M Bl-Na Phos-Ph Sal (URIBEL) 118 MG CAPS Take one capsule by mouth 3 times daily (Patient taking differently: as needed.) 30 capsule 3   mirabegron ER (MYRBETRIQ) 25 MG TB24 tablet TAKE 1 TABLET BY MOUTH ONCE A DAY 30 tablet 3   Nebivolol HCl 20 MG TABS Take 1/2 tablet (10 mg total) by mouth daily. 90 tablet 1   nitrofurantoin, macrocrystal-monohydrate, (MACROBID) 100 MG capsule Take 1 capsule (100 mg total) by mouth 2 (two) times daily. 10  capsule 0   Omega-3 Fatty Acids (FISH OIL) 1200 MG CAPS Take 1,200 mg by mouth every evening.      omeprazole (PRILOSEC) 20 MG capsule Take 1 capsule (20 mg total) by mouth daily. 90 capsule 3   ondansetron (ZOFRAN) 4 MG tablet Take 1 tablet (4 mg total) by mouth every 8 (eight) hours as needed for nausea or vomiting. 40 tablet 3   oxybutynin (DITROPAN) 5 MG tablet TAKE 1 TABLET BY MOUTH EVERY 8 HOURS AS NEEDED 90 tablet 3   oxymetazoline (AFRIN) 0.05 % nasal spray Place 1 spray into both nostrils 2 (two) times daily as needed for congestion. For nose bleeds     Phenazopyridine HCl (AZO URINARY PAIN PO) Take 2 tablets by mouth 3 (three) times daily as needed (bladder spasms).     simvastatin (ZOCOR) 20 MG tablet TAKE 1 TABLET (20 MG TOTAL) BY MOUTH DAILY. (Patient taking differently: Take 20 mg by mouth at bedtime.) 90 tablet 3   tamsulosin (FLOMAX) 0.4 MG CAPS capsule TAKE 1 CAPSULE BY MOUTH AT BEDTIME 30 capsule 3   thiamine 100 MG tablet Take 1 tablet (100 mg total) by mouth daily. 30 tablet 0   traMADol (ULTRAM) 50 MG tablet Take 1 tablet (50 mg total) by mouth 3 (three) times daily as needed. 30 tablet 3   vitamin B-12 (CYANOCOBALAMIN) 1000 MCG tablet Take 2,000 mcg by mouth every evening.      Vitamin E 180 MG (400 UNIT) CAPS Take 400 Units by mouth every evening.      No current facility-administered medications for this visit.     Allergies:  Allergies  Allergen Reactions   Metformin And Related Other (See Comments)    Lactic acidosis    Penicillins Hives and Rash    Reports hives to penicillin and amoxicillin as an adult "a long time ago." Has tolerated cefdinir (09/2020) and cefepime (10/2020) before.   Metformin     NDC Code:00093721201 NDC WRUE:[id number]   Penicillin G Sodium     NDC NWGN:[id number] NDC IONG:[id number] NDC WNUU:[id number]      Physical Exam:   Blood pressure 131/88, pulse 79, temperature 97.6 F (36.4 C), temperature source Temporal, resp.  rate 17, height 5\' 6"  (1.676 m), weight 176 lb 12.8 oz (80.2 kg), SpO2 97 %.      ECOG: 1   General appearance: Alert, awake without any distress. Head: Atraumatic without abnormalities Oropharynx: Without any thrush or ulcers. Eyes: No scleral icterus. Lymph nodes: No lymphadenopathy noted in the cervical, supraclavicular, or axillary nodes Heart:regular rate and rhythm, without any murmurs or gallops.   Lung: Clear to auscultation without any rhonchi, wheezes or dullness to percussion. Abdomin: Soft, nontender without any shifting dullness or ascites. Musculoskeletal: No clubbing or cyanosis. Neurological: No motor or sensory deficits. Skin: No rashes or lesions.  Lab Results: Lab Results  Component Value Date   WBC 5.5 12/18/2021   HGB 9.8 (L) 12/18/2021   HCT 28.9 (L) 12/18/2021   MCV 112.5 (H) 12/18/2021   PLT 114 (L) 12/18/2021     Chemistry      Component Value Date/Time   NA 137 12/18/2021 0814   NA 142 04/26/2018 1333   K 4.2 12/18/2021 0814   CL 104 12/18/2021 0814   CO2 25 12/18/2021 0814   BUN 18 12/18/2021 0814   BUN 22 04/26/2018 1333   CREATININE 1.20 (H) 12/18/2021 0814   CREATININE 1.32 (H) 06/04/2021 0000   GLU 87 02/07/2013 0000      Component Value Date/Time   CALCIUM 9.1 12/18/2021 0814   ALKPHOS 102 12/18/2021 0814   AST 14 (L) 12/18/2021 0814   ALT 9 12/18/2021 0814   BILITOT 0.3 12/18/2021 0814       Impression and Plan:    66 year old woman with:   1.   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  She continues to receive maintenance therapy without any major complications.  Risks and benefits of this treatment were discussed.  Different salvage therapy option will be utilized if she has progression of disease.  We will update her staging scan before the next visit.  ns    2.  IV access: Port-A-Cath remains in use without any issues.   3.   Antiemetics: No nausea or vomiting reported.  Compazine is available to her.   4.  Renal function surveillance: Creatinine clearance continues to be consistent at baseline.  Bilateral stents are in place and managed by Dr. Claudia Desanctis.   5.  Goals of care: Therapy remains palliative although aggressive measures are warranted given her excellent performance status.  Life expectancy of was discussed today based on her request.  It is difficult to predict at this time and will have a better assessment based on her upcoming studies.   6.  Anemia: Related to malignancy and chemotherapy.  We will continue to monitor and transfuse as needed.  7.  Thrombocytopenia: Remains mild and asymptomatic.  This is related to chemotherapy.  8.  Mood disorder: She is currently on antidepressant medication including Lexapro, Wellbutrin and Abilify.   9.  Follow-up: She will return in 2 weeks for repeat follow-up.   30  minutes were dedicated to this encounter.  The time was spent on reviewing laboratory data, disease status update in answering questions regarding future salvage therapy options.   Zola Button, MD 1/19/20237:55 AM

## 2022-01-02 ENCOUNTER — Other Ambulatory Visit (HOSPITAL_COMMUNITY): Payer: Self-pay

## 2022-01-03 ENCOUNTER — Inpatient Hospital Stay: Payer: No Typology Code available for payment source

## 2022-01-03 ENCOUNTER — Other Ambulatory Visit: Payer: Self-pay

## 2022-01-03 VITALS — BP 138/85 | HR 69 | Temp 98.1°F | Resp 16

## 2022-01-03 DIAGNOSIS — C669 Malignant neoplasm of unspecified ureter: Secondary | ICD-10-CM

## 2022-01-03 DIAGNOSIS — C801 Malignant (primary) neoplasm, unspecified: Secondary | ICD-10-CM

## 2022-01-03 DIAGNOSIS — Z5111 Encounter for antineoplastic chemotherapy: Secondary | ICD-10-CM | POA: Diagnosis not present

## 2022-01-03 MED ORDER — HEPARIN SOD (PORK) LOCK FLUSH 100 UNIT/ML IV SOLN
500.0000 [IU] | Freq: Once | INTRAVENOUS | Status: AC | PRN
  Administered 2022-01-03: 500 [IU]

## 2022-01-03 MED ORDER — SODIUM CHLORIDE 0.9% FLUSH
10.0000 mL | INTRAVENOUS | Status: DC | PRN
  Administered 2022-01-03: 10 mL

## 2022-01-07 ENCOUNTER — Telehealth (INDEPENDENT_AMBULATORY_CARE_PROVIDER_SITE_OTHER): Payer: No Typology Code available for payment source | Admitting: Medical-Surgical

## 2022-01-07 ENCOUNTER — Encounter: Payer: Self-pay | Admitting: Medical-Surgical

## 2022-01-07 VITALS — BP 128/76 | HR 75 | Temp 97.8°F | Wt 172.0 lb

## 2022-01-07 DIAGNOSIS — J0140 Acute pansinusitis, unspecified: Secondary | ICD-10-CM

## 2022-01-07 MED ORDER — AZITHROMYCIN 250 MG PO TABS: ORAL_TABLET | ORAL | 0 refills | Status: AC

## 2022-01-07 MED ORDER — AZELASTINE HCL 0.1 % NA SOLN: 2.0000 | Freq: Two times a day (BID) | NASAL | 1 refills | Status: DC

## 2022-01-07 NOTE — Progress Notes (Signed)
Congestion, sinus pain, headache, no fever About a week

## 2022-01-07 NOTE — Progress Notes (Signed)
Virtual Visit via Video Note  I connected with Mary Stark on 01/07/22 at 10:30 AM EST by a video enabled telemedicine application and verified that I am speaking with the correct person using two identifiers.   I discussed the limitations of evaluation and management by telemedicine and the availability of in person appointments. The patient expressed understanding and agreed to proceed.  Patient location: home Provider locations: office  Subjective:    CC: Sinus symptoms  HPI: Very pleasant 66 year old female presenting via MyChart video visit with reports of 1 week of upper respiratory symptoms including sinus congestion, sinus pain, facial pain, and headache.  She did originally have a cough but this has resolved.  Notes that her grandson had an upper respiratory infection that has gone through the family.  Everyone else has gotten well but she has continued to struggle with the symptoms.  She has been using Tylenol, Mucinex, and Afrin as needed.  Currently doing chemotherapy and had her last treatment on Thursday.  Has an upcoming CT of the abdomen, pelvis, and chest.  Past medical history, Surgical history, Family history not pertinant except as noted below, Social history, Allergies, and medications have been entered into the medical record, reviewed, and corrections made.   Review of Systems: See HPI for pertinent positives and negatives.   Objective:    General: Speaking clearly in complete sentences without any shortness of breath.  Alert and oriented x3.  Normal judgment. No apparent acute distress.  Impression and Recommendations:    1. Acute non-recurrent pansinusitis Adding azithromycin.  Sending in Astelin nasal spray to replace Afrin to prevent rebound congestion.  Okay to continue using Tylenol and Mucinex as needed.  Discussed home remedies and other conservative measures for symptoms.  Push fluids.  Increase rest.  Consider a coolmist humidifier.  I discussed the  assessment and treatment plan with the patient. The patient was provided an opportunity to ask questions and all were answered. The patient agreed with the plan and demonstrated an understanding of the instructions.   The patient was advised to call back or seek an in-person evaluation if the symptoms worsen or if the condition fails to improve as anticipated.  20 minutes of non-face-to-face time was provided during this encounter.  Return if symptoms worsen or fail to improve.  [name], DNP, APRN, FNP-BC [facility]

## 2022-01-08 ENCOUNTER — Encounter (HOSPITAL_COMMUNITY): Payer: Self-pay

## 2022-01-08 ENCOUNTER — Ambulatory Visit (HOSPITAL_COMMUNITY)
Admission: RE | Admit: 2022-01-08 | Discharge: 2022-01-08 | Disposition: A | Discharge status: Home / Self Care | Payer: No Typology Code available for payment source | Source: Ambulatory Visit | Attending: Oncology | Admitting: Oncology

## 2022-01-08 DIAGNOSIS — C669 Malignant neoplasm of unspecified ureter: Secondary | ICD-10-CM | POA: Insufficient documentation

## 2022-01-08 MED ORDER — IOHEXOL 300 MG/ML  SOLN
100.0000 mL | Freq: Once | INTRAMUSCULAR | Status: AC | PRN
  Administered 2022-01-08: 100 mL via INTRAVENOUS

## 2022-01-08 MED ORDER — SODIUM CHLORIDE (PF) 0.9 % IJ SOLN
INTRAMUSCULAR | Status: AC
  Filled 2022-01-08: qty 50

## 2022-01-08 MED ORDER — SODIUM CHLORIDE 0.9 % IV SOLN
INTRAVENOUS | Status: AC
  Filled 2022-01-08: qty 250

## 2022-01-14 ENCOUNTER — Other Ambulatory Visit (HOSPITAL_COMMUNITY): Payer: Self-pay

## 2022-01-15 ENCOUNTER — Inpatient Hospital Stay: Payer: No Typology Code available for payment source | Attending: Oncology

## 2022-01-15 ENCOUNTER — Inpatient Hospital Stay (HOSPITAL_BASED_OUTPATIENT_CLINIC_OR_DEPARTMENT_OTHER): Payer: No Typology Code available for payment source | Admitting: Oncology

## 2022-01-15 ENCOUNTER — Other Ambulatory Visit: Payer: Self-pay

## 2022-01-15 ENCOUNTER — Inpatient Hospital Stay: Payer: No Typology Code available for payment source

## 2022-01-15 VITALS — BP 133/71 | HR 73 | Temp 98.0°F | Resp 16 | Ht 66.0 in | Wt 177.8 lb

## 2022-01-15 DIAGNOSIS — D630 Anemia in neoplastic disease: Secondary | ICD-10-CM | POA: Diagnosis not present

## 2022-01-15 DIAGNOSIS — C669 Malignant neoplasm of unspecified ureter: Secondary | ICD-10-CM | POA: Insufficient documentation

## 2022-01-15 DIAGNOSIS — Z95828 Presence of other vascular implants and grafts: Secondary | ICD-10-CM | POA: Diagnosis not present

## 2022-01-15 DIAGNOSIS — C772 Secondary and unspecified malignant neoplasm of intra-abdominal lymph nodes: Secondary | ICD-10-CM | POA: Insufficient documentation

## 2022-01-15 DIAGNOSIS — C801 Malignant (primary) neoplasm, unspecified: Secondary | ICD-10-CM

## 2022-01-15 DIAGNOSIS — Z452 Encounter for adjustment and management of vascular access device: Secondary | ICD-10-CM | POA: Diagnosis not present

## 2022-01-15 DIAGNOSIS — Z79899 Other long term (current) drug therapy: Secondary | ICD-10-CM | POA: Diagnosis not present

## 2022-01-15 DIAGNOSIS — Z5111 Encounter for antineoplastic chemotherapy: Secondary | ICD-10-CM | POA: Diagnosis not present

## 2022-01-15 DIAGNOSIS — R5383 Other fatigue: Secondary | ICD-10-CM | POA: Insufficient documentation

## 2022-01-15 LAB — CBC WITH DIFFERENTIAL (CANCER CENTER ONLY)
Abs Immature Granulocytes: 0.08 10*3/uL — ABNORMAL HIGH (ref 0.00–0.07)
Basophils Absolute: 0 10*3/uL (ref 0.0–0.1)
Basophils Relative: 0 %
Eosinophils Absolute: 0.1 10*3/uL (ref 0.0–0.5)
Eosinophils Relative: 1 %
HCT: 26.5 % — ABNORMAL LOW (ref 36.0–46.0)
Hemoglobin: 9 g/dL — ABNORMAL LOW (ref 12.0–15.0)
Immature Granulocytes: 1 %
Lymphocytes Relative: 23 %
Lymphs Abs: 1.8 10*3/uL (ref 0.7–4.0)
MCH: 38 pg — ABNORMAL HIGH (ref 26.0–34.0)
MCHC: 34 g/dL (ref 30.0–36.0)
MCV: 111.8 fL — ABNORMAL HIGH (ref 80.0–100.0)
Monocytes Absolute: 1 10*3/uL (ref 0.1–1.0)
Monocytes Relative: 12 %
Neutro Abs: 5 10*3/uL (ref 1.7–7.7)
Neutrophils Relative %: 63 %
Platelet Count: 145 10*3/uL — ABNORMAL LOW (ref 150–400)
RBC: 2.37 MIL/uL — ABNORMAL LOW (ref 3.87–5.11)
RDW: 15 % (ref 11.5–15.5)
WBC Count: 7.9 10*3/uL (ref 4.0–10.5)
nRBC: 0 % (ref 0.0–0.2)

## 2022-01-15 LAB — CMP (CANCER CENTER ONLY)
ALT: 6 U/L (ref 0–44)
AST: 10 U/L — ABNORMAL LOW (ref 15–41)
Albumin: 4 g/dL (ref 3.5–5.0)
Alkaline Phosphatase: 87 U/L (ref 38–126)
Anion gap: 7 (ref 5–15)
BUN: 22 mg/dL (ref 8–23)
CO2: 26 mmol/L (ref 22–32)
Calcium: 9.1 mg/dL (ref 8.9–10.3)
Chloride: 102 mmol/L (ref 98–111)
Creatinine: 1.21 mg/dL — ABNORMAL HIGH (ref 0.44–1.00)
GFR, Estimated: 50 mL/min — ABNORMAL LOW (ref >60)
Glucose, Bld: 98 mg/dL (ref 70–99)
Potassium: 4.4 mmol/L (ref 3.5–5.1)
Sodium: 135 mmol/L (ref 135–145)
Total Bilirubin: 0.5 mg/dL (ref 0.3–1.2)
Total Protein: 7.4 g/dL (ref 6.5–8.1)

## 2022-01-15 LAB — SAMPLE TO BLOOD BANK

## 2022-01-15 MED ORDER — ONDANSETRON HCL 4 MG/2ML IJ SOLN
8.0000 mg | Freq: Once | INTRAMUSCULAR | Status: AC
  Administered 2022-01-15: 8 mg via INTRAVENOUS
  Filled 2022-01-15: qty 4

## 2022-01-15 MED ORDER — LEUCOVORIN CALCIUM INJECTION 350 MG
400.0000 mg/m2 | Freq: Once | INTRAVENOUS | Status: AC
  Administered 2022-01-15: 804 mg via INTRAVENOUS
  Filled 2022-01-15: qty 40.2

## 2022-01-15 MED ORDER — SODIUM CHLORIDE 0.9 % IV SOLN: Freq: Once | INTRAVENOUS | Status: AC

## 2022-01-15 MED ORDER — SODIUM CHLORIDE 0.9 % IV SOLN
2400.0000 mg/m2 | INTRAVENOUS | Status: DC
  Administered 2022-01-15: 4800 mg via INTRAVENOUS
  Filled 2022-01-15: qty 96

## 2022-01-15 MED ORDER — SODIUM CHLORIDE 0.9% FLUSH
10.0000 mL | Freq: Once | INTRAVENOUS | Status: AC
  Administered 2022-01-15: 10 mL

## 2022-01-15 MED ORDER — FLUOROURACIL CHEMO INJECTION 2.5 GM/50ML
400.0000 mg/m2 | Freq: Once | INTRAVENOUS | Status: AC
  Administered 2022-01-15: 800 mg via INTRAVENOUS
  Filled 2022-01-15: qty 16

## 2022-01-15 NOTE — Progress Notes (Signed)
Denies anyHematology and Oncology Follow Up Visit  Mary Stark [id number] 1956/02/03 66 y.o. 01/15/2022 9:04 AM Mary Fiscal, NP   [name]: 66 year old woman with stage IV bladder cancer diagnosed in November 2021.  She was found to have 2 IV adenocarcinoma with abdominal and pelvic adenopathy.     Prior Therapy:  She is status post retroperitoneal lymph node biopsy completed on 10/24/2020 which showed metastatic carcinoma.  She is status post repeat cystoscopy and bilateral ureteral stent exchange as well as resection of a bladder tumor on January 21, 2021.  A final pathology showed an adenocarcinoma.  Chemotherapy utilizing carboplatin and gemcitabine started on November 22, 2020.  She completed 6 cycles of therapy on March 06, 2021.  FOLFOX chemotherapy started on Apr 15, 2021.  She completed 12 cycles of therapy.  Current therapy: 5-FU and leucovorin chemotherapy every 2 weeks started in November 2022.  She returns for the next cycle of therapy.   Interim History: Mary Stark returns today for repeat follow-up.  Since her last visit, she reports no major changes in her health.  She continues to tolerate chemotherapy without any major complaints.  She denies any nausea, vomiting or abdominal pain.  She denies any excessive fatigue or tiredness.  She denies any hematuria or dysuria.     Medications: Updated on review. Current Outpatient Medications  Medication Sig Dispense Refill   acetaminophen (TYLENOL) 500 MG tablet Take 625 mg by mouth every 8 (eight) hours as needed for moderate pain.     albuterol (PROAIR HFA) 108 (90 Base) MCG/ACT inhaler INHALE 1 TO 2 PUFFS BY MOUTH INTO THE LUNGS EVERY 4 HOURS AS NEEDED FOR WHEEZING OR SHORTNESS OF BREATH 8 g 1   amLODipine (NORVASC) 5 MG tablet Take 5 mg by mouth as needed.     ARIPiprazole (ABILIFY) 10 MG tablet TAKE 1 TABLET BY MOUTH ONCE DAILY 90 tablet 0   ascorbic acid (VITAMIN C) 500 MG tablet Take 1,000  mg by mouth every evening.      azelastine (ASTELIN) 0.1 % nasal spray Place 2 sprays into both nostrils 2 (two) times daily. Use in each nostril as directed 301 mL 1   benzonatate (TESSALON) 200 MG capsule Take 1 capsule (200 mg total) by mouth 3 (three) times daily as needed for cough. 30 capsule 0   buPROPion (WELLBUTRIN XL) 300 MG 24 hr tablet TAKE 1 TABLET BY MOUTH EVERY DAY 30 tablet 3   cephALEXin (KEFLEX) 250 MG capsule Take 1 capsule by mouth nightly as directed 90 capsule 1   Cholecalciferol (VITAMIN D) 125 MCG (5000 UT) CAPS Take 5,000 Units by mouth every evening.      clonazePAM (KLONOPIN) 0.5 MG tablet TAKE 1 TABLET BY MOUTH AT BEDTIME 30 tablet 1   Coenzyme Q10 (CO Q-10) 200 MG CAPS Take 200 mg by mouth every evening.      cycloSPORINE (RESTASIS) 0.05 % ophthalmic emulsion Place 1 drop both eyes twice a day 180 each 3   escitalopram (LEXAPRO) 5 MG tablet Take 1 tablet by mouth daily. 30 tablet 3   fluticasone-salmeterol (ADVAIR HFA) 115-21 MCG/ACT inhaler Inhale 2 puffs into the lungs 2 (two) times daily. (Patient taking differently: Inhale 2 puffs into the lungs 2 (two) times daily. Havent had it filled) 1 each 0   gabapentin (NEURONTIN) 300 MG capsule TAKE 1 CAPSULE BY MOUTH IN THE MORNING, 1 CAPSULE IN THE AFTERNOON & 4 CAPSULES AT NIGHT (Patient taking differently: Take 300 mg  by mouth 4 (four) times daily.) 180 capsule 3   GLUCOSAMINE-CHONDROITIN PO Take 2 tablets by mouth every evening.      lidocaine-prilocaine (EMLA) cream Apply 1 application topically as needed. 30 g 0   Meth-Hyo-M Bl-Na Phos-Ph Sal (URIBEL) 118 MG CAPS Take one capsule by mouth 3 times daily (Patient taking differently: as needed.) 30 capsule 3   mirabegron ER (MYRBETRIQ) 25 MG TB24 tablet TAKE 1 TABLET BY MOUTH ONCE A DAY 30 tablet 3   Nebivolol HCl 20 MG TABS Take 1/2 tablet (10 mg total) by mouth daily. 90 tablet 1   nitrofurantoin, macrocrystal-monohydrate, (MACROBID) 100 MG capsule Take 1 capsule (100  mg total) by mouth 2 (two) times daily. 10 capsule 0   Omega-3 Fatty Acids (FISH OIL) 1200 MG CAPS Take 1,200 mg by mouth every evening.      omeprazole (PRILOSEC) 20 MG capsule Take 1 capsule (20 mg total) by mouth daily. 90 capsule 3   ondansetron (ZOFRAN) 4 MG tablet Take 1 tablet (4 mg total) by mouth every 8 (eight) hours as needed for nausea or vomiting. 40 tablet 3   oxybutynin (DITROPAN) 5 MG tablet TAKE 1 TABLET BY MOUTH EVERY 8 HOURS AS NEEDED 90 tablet 3   oxymetazoline (AFRIN) 0.05 % nasal spray Place 1 spray into both nostrils 2 (two) times daily as needed for congestion. For nose bleeds     Phenazopyridine HCl (AZO URINARY PAIN PO) Take 2 tablets by mouth 3 (three) times daily as needed (bladder spasms).     simvastatin (ZOCOR) 20 MG tablet TAKE 1 TABLET (20 MG TOTAL) BY MOUTH DAILY. (Patient taking differently: Take 20 mg by mouth at bedtime.) 90 tablet 3   tamsulosin (FLOMAX) 0.4 MG CAPS capsule TAKE 1 CAPSULE BY MOUTH AT BEDTIME 30 capsule 3   thiamine 100 MG tablet Take 1 tablet (100 mg total) by mouth daily. 30 tablet 0   traMADol (ULTRAM) 50 MG tablet Take 1 tablet (50 mg total) by mouth 3 (three) times daily as needed. 30 tablet 3   vitamin B-12 (CYANOCOBALAMIN) 1000 MCG tablet Take 2,000 mcg by mouth every evening.      Vitamin E 180 MG (400 UNIT) CAPS Take 400 Units by mouth every evening.      No current facility-administered medications for this visit.     Allergies:  Allergies  Allergen Reactions   Metformin And Related Other (See Comments)    Lactic acidosis    Penicillins Hives and Rash    Reports hives to penicillin and amoxicillin as an adult "a long time ago." Has tolerated cefdinir (09/2020) and cefepime (10/2020) before.   Metformin     NDC Code:00093721201 NDC UJWJ:[id number]   Penicillin G Sodium     NDC ZHYQ:[id number] NDC MWUX:[id number] NDC DGUY:[id number]      Physical Exam:    Blood pressure 133/71, pulse 73, temperature 98 F (36.7  C), temperature source Axillary, resp. rate 16, height 5\' 6"  (1.676 m), weight 177 lb 12.8 oz (80.6 kg), SpO2 98 %.      ECOG: 1    General appearance: Comfortable appearing without any discomfort Head: Normocephalic without any trauma Oropharynx: Mucous membranes are moist and pink without any thrush or ulcers. Eyes: Pupils are equal and round reactive to light. Lymph nodes: No cervical, supraclavicular, inguinal or axillary lymphadenopathy.   Heart:regular rate and rhythm.  S1 and S2 without leg edema. Lung: Clear without any rhonchi or wheezes.  No dullness to percussion. Abdomin: Soft, nontender,  nondistended with good bowel sounds.  No hepatosplenomegaly. Musculoskeletal: No joint deformity or effusion.  Full range of motion noted. Neurological: No deficits noted on motor, sensory and deep tendon reflex exam. Skin: No petechial rash or dryness.  Appeared moist.                     Lab Results: Lab Results  Component Value Date   WBC 7.1 01/01/2022   HGB 9.4 (L) 01/01/2022   HCT 28.2 (L) 01/01/2022   MCV 113.7 (H) 01/01/2022   PLT 135 (L) 01/01/2022     Chemistry      Component Value Date/Time   NA 136 01/01/2022 0817   NA 142 04/26/2018 1333   K 4.1 01/01/2022 0817   CL 104 01/01/2022 0817   CO2 25 01/01/2022 0817   BUN 20 01/01/2022 0817   BUN 22 04/26/2018 1333   CREATININE 1.22 (H) 01/01/2022 0817   CREATININE 1.32 (H) 06/04/2021 0000   GLU 87 02/07/2013 0000      Component Value Date/Time   CALCIUM 9.0 01/01/2022 0817   ALKPHOS 99 01/01/2022 0817   AST 11 (L) 01/01/2022 0817   ALT 7 01/01/2022 0817   BILITOT 0.5 01/01/2022 0817       IMPRESSION: 1. Bilateral double-J ureteral stents remain in position, with formed pigtails in the renal pelves and bladder. Unchanged, moderate bilateral hydronephrosis. 2. There has been slight interval enlargement of a hypodense soft tissue nodule or lymph node overlying the right psoas, measuring  1.4 x 1.3 cm, previously 1.0 x 1.0 cm. This is worrisome for metastatic disease. 3. Multiple additional small, matted retroperitoneal and iliac nodes are unchanged, consistent with treated nodal metastases. 4. No other evidence of new metastatic disease in the chest, abdomen, or pelvis. 5. Unchanged 0.4 cm nodule of the dependent right lower lobe, as on prior examination almost certainly benign and incidental. Attention on follow-up. 6. Unchanged, arcing, bandlike irregular peripheral interstitial opacity with subpleural sparing throughout the lungs, consistent with sequelae of prior COVID    Impression and Plan:    66 year old woman with:   1.   Bladder cancer diagnosed in November 2021.  She developed stage IV adenocarcinoma with adenopathy.  CT scan obtained on January 08, 2022 was personally reviewed and showed overall stable disease except for enlargement of a soft tissue nodule of the right psoas muscle.  No other areas of disease progression noted at this time.  Treatment options at this time were discussed.  I recommended continuing maintenance chemotherapy with 5-FU leucovorin and to address the oligoprogression with radiation therapy.  Switching systemic therapy could be considered if she has more systemic disease with the next scan.  I anticipate the next scan will be in 2 to 3 months.    2.  IV access: Port-A-Cath continues to be in use without any issues.   3.  Antiemetics: Compazine is available to her without any nausea or vomiting.   4.  Renal function surveillance: Renal failure was related to bilateral hydronephrosis with stents in place.  Kidney function remained stable with creatinine clearance around 50 cc/min.   5.  Goals of care: Her disease is palliative although aggressive measures are warranted.   6.  Anemia: Her hemoglobin is 9.0 and does not require transfusion.  Her anemia is related to malignancy and chemotherapy.  7.  Thrombocytopenia: Resolved at  this time and is predominantly related to oxaliplatin.  8.  Follow-up: In 2 weeks for the next  cycle of therapy.   30  minutes were spent on this visit.  The time was dedicated to reviewing laboratory data, disease status update, and future plan of care review.   Zola Button, MD 2/2/20239:04 AM

## 2022-01-16 ENCOUNTER — Other Ambulatory Visit (HOSPITAL_COMMUNITY): Payer: Self-pay

## 2022-01-16 ENCOUNTER — Encounter: Payer: Self-pay | Admitting: Medical-Surgical

## 2022-01-16 ENCOUNTER — Ambulatory Visit (INDEPENDENT_AMBULATORY_CARE_PROVIDER_SITE_OTHER): Payer: No Typology Code available for payment source | Admitting: Medical-Surgical

## 2022-01-16 VITALS — BP 143/83 | HR 69 | Resp 20 | Ht 66.0 in | Wt 177.0 lb

## 2022-01-16 DIAGNOSIS — G4701 Insomnia due to medical condition: Secondary | ICD-10-CM

## 2022-01-16 DIAGNOSIS — F418 Other specified anxiety disorders: Secondary | ICD-10-CM | POA: Diagnosis not present

## 2022-01-16 MED ORDER — CLONAZEPAM 1 MG PO TABS
1.0000 mg | ORAL_TABLET | Freq: Every day | ORAL | 2 refills | Status: DC
Start: 2022-01-16 — End: 2022-04-17
  Filled 2022-01-16: qty 30, 30d supply, fill #0
  Filled 2022-02-18: qty 30, 30d supply, fill #1
  Filled 2022-03-19: qty 30, 30d supply, fill #2

## 2022-01-16 NOTE — Progress Notes (Signed)
°  HPI with pertinent ROS:   CC: Mood/insomnia follow-up  HPI: Pleasant 66 year old female presenting today for follow-up on mood and insomnia.  Since her last visit, she has started Lexapro 5 mg daily and feels like it may be helping a little.  Notes that she had previously had no thoughts of breaking out her sewing machine to do quilting but she has had several instances in the past couple of weeks where she is given thought to it although she has not done so yet.  She is still using Wellbutrin 300 mg daily, tolerating well without side effects.  She is using clonazepam 0.5 mg at bedtime but notes it is not working as well as it did previously.  Over the last couple of days that she has had some significantly disturbing news.  She has another lymph node that is swollen and they are planning to do radiation treatments there.  She also was notified today that her position with [employer] has been terminated.  She will have insurance through the end of the month and then will have to go on COBRA until she qualifies for Medicare in June.  Denies SI/HI.  I reviewed the past medical history, family history, social history, surgical history, and allergies today and no changes were needed.  Please see the problem list section below in epic for further details.   Physical exam:   General: Well Developed, well nourished, and in no acute distress.  Neuro: Alert and oriented x3.  HEENT: Normocephalic, atraumatic.  Skin: Warm and dry. Cardiac: Regular rate and rhythm.  Respiratory: Not using accessory muscles, speaking in full sentences.  Impression and Recommendations:    1. Depression with anxiety Continue Lexapro 5 mg daily and Wellbutrin 300 mg daily.  Discussed possibly increasing the dose of Lexapro but would like to hold steady for a bit longer.  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  Ultimately, do  not like using benzodiazepines to help with sleep but in her case, she has been treated with this long-term and has some significant stressors going on.  I will send her a MyChart message in a couple of weeks to see she how she is doing with this increased dose.  Return in about 3 months (around 04/15/2022) for chronic disease follow up. ___________________________________________ [name], DNP, APRN, FNP-BC [facility]

## 2022-01-17 ENCOUNTER — Other Ambulatory Visit: Payer: Self-pay

## 2022-01-17 ENCOUNTER — Other Ambulatory Visit (HOSPITAL_COMMUNITY): Payer: Self-pay

## 2022-01-17 ENCOUNTER — Inpatient Hospital Stay: Payer: No Typology Code available for payment source

## 2022-01-17 VITALS — BP 133/83 | HR 96 | Temp 97.9°F | Resp 18 | Ht 66.0 in

## 2022-01-17 DIAGNOSIS — C669 Malignant neoplasm of unspecified ureter: Secondary | ICD-10-CM

## 2022-01-17 DIAGNOSIS — C801 Malignant (primary) neoplasm, unspecified: Secondary | ICD-10-CM

## 2022-01-17 DIAGNOSIS — Z5111 Encounter for antineoplastic chemotherapy: Secondary | ICD-10-CM | POA: Diagnosis not present

## 2022-01-17 DIAGNOSIS — Z95828 Presence of other vascular implants and grafts: Secondary | ICD-10-CM

## 2022-01-17 MED ORDER — SODIUM CHLORIDE 0.9% FLUSH
10.0000 mL | Freq: Once | INTRAVENOUS | Status: AC
  Administered 2022-01-17: 10 mL

## 2022-01-17 MED ORDER — HEPARIN SOD (PORK) LOCK FLUSH 100 UNIT/ML IV SOLN
500.0000 [IU] | Freq: Once | INTRAVENOUS | Status: AC
  Administered 2022-01-17: 500 [IU]

## 2022-01-19 ENCOUNTER — Other Ambulatory Visit (HOSPITAL_COMMUNITY): Payer: Self-pay

## 2022-01-19 MED ORDER — TAMSULOSIN HCL 0.4 MG PO CAPS
ORAL_CAPSULE | Freq: Every day | ORAL | 3 refills | Status: DC
  Filled 2022-01-19: qty 30, 30d supply, fill #0
  Filled 2022-02-24: qty 30, 30d supply, fill #1
  Filled 2022-03-30: qty 30, 30d supply, fill #2
  Filled 2022-04-28: qty 30, 30d supply, fill #3

## 2022-01-19 MED ORDER — MIRABEGRON ER 25 MG PO TB24
ORAL_TABLET | Freq: Every day | ORAL | 3 refills | Status: DC
  Filled 2022-01-19: qty 30, 30d supply, fill #0
  Filled 2022-02-24: qty 30, 30d supply, fill #1
  Filled 2022-03-30: qty 30, 30d supply, fill #2
  Filled 2022-04-28: qty 30, 30d supply, fill #3

## 2022-01-20 ENCOUNTER — Telehealth: Payer: Self-pay | Admitting: Physician Assistant

## 2022-01-20 NOTE — Telephone Encounter (Signed)
Scheduled per 01/05 los, patient has been called and notified of upcoming appointments.

## 2022-01-22 ENCOUNTER — Other Ambulatory Visit (HOSPITAL_COMMUNITY): Payer: Self-pay

## 2022-01-22 ENCOUNTER — Encounter: Payer: Self-pay | Admitting: Oncology

## 2022-01-26 NOTE — Progress Notes (Signed)
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   Biopsies:   Tissue biopsy obtained on October 24, 2020 which showed metastatic carcinoma with immunohistochemical stains showed positive cytokeratin AE1/3, cytokeratin 7 with patchy 2-week positive for cytokeratin 20, GATA3 and PAX 8.  The cells are negative for TTF-1, CDX2, CD3 and CD20.  The differential diagnosis of these findings include genitourinary versus gynecological primary  Past/Anticipated interventions by urology, if any:   Past/Anticipated interventions by medical oncology, if any:  Dr. Alen Blew  1.   Bladder cancer diagnosed in November 2021.  She developed stage IV adenocarcinoma with adenopathy.   CT scan obtained on January 08, 2022 was personally reviewed and showed overall stable disease except for enlargement of a soft tissue nodule of the right psoas muscle.  No other areas of disease progression noted at this time.  Treatment options at this time were discussed.  I recommended continuing maintenance chemotherapy with 5-FU leucovorin and to address the oligoprogression with radiation therapy.  Switching systemic therapy could be considered if she has more systemic disease with the next scan.  I anticipate the next scan will be in 2 to 3 months.    2.  IV access: Port-A-Cath continues to be in use without any issues.   3.  Antiemetics: Compazine is available to her without any nausea or vomiting.   4.  Renal function surveillance: Renal failure was related to bilateral hydronephrosis with stents in place.  Kidney function remained stable with creatinine clearance around 50 cc/min.   5.  Goals of care: Her disease is palliative although aggressive measures are warranted.    6.  Anemia: Her hemoglobin is 9.0 and does not require transfusion.  Her anemia is related to malignancy and chemotherapy.   7.  Thrombocytopenia: Resolved at this time and is predominantly  related to oxaliplatin.   8.  Follow-up: In 2 weeks for the next cycle of therapy.   Weight changes, if any: No  Bowel/Bladder complaints, if any: No bowel and bladder frequency has stents   Nausea/Vomiting, if any: Occasional nausea no vomiting  Pain issues, if any:  2/10 hip  SAFETY ISSUES: Prior radiation? No Pacemaker/ICD?  No Possible current pregnancy? No Is the patient on methotrexate?  No  Current Complaints / other details:  Bilateral ureteral stent exchange.

## 2022-01-27 ENCOUNTER — Other Ambulatory Visit: Payer: Self-pay

## 2022-01-27 ENCOUNTER — Ambulatory Visit
Admission: RE | Admit: 2022-01-27 | Discharge: 2022-01-27 | Disposition: A | Discharge status: Home / Self Care | Payer: No Typology Code available for payment source | Source: Ambulatory Visit | Attending: Radiation Oncology | Admitting: Radiation Oncology

## 2022-01-27 ENCOUNTER — Other Ambulatory Visit (HOSPITAL_COMMUNITY): Payer: Self-pay

## 2022-01-27 VITALS — BP 128/76 | HR 68 | Temp 98.0°F | Resp 20 | Ht 66.0 in | Wt 179.0 lb

## 2022-01-27 DIAGNOSIS — Z9221 Personal history of antineoplastic chemotherapy: Secondary | ICD-10-CM | POA: Insufficient documentation

## 2022-01-27 DIAGNOSIS — D6481 Anemia due to antineoplastic chemotherapy: Secondary | ICD-10-CM | POA: Insufficient documentation

## 2022-01-27 DIAGNOSIS — Z8041 Family history of malignant neoplasm of ovary: Secondary | ICD-10-CM | POA: Insufficient documentation

## 2022-01-27 DIAGNOSIS — E559 Vitamin D deficiency, unspecified: Secondary | ICD-10-CM | POA: Insufficient documentation

## 2022-01-27 DIAGNOSIS — K219 Gastro-esophageal reflux disease without esophagitis: Secondary | ICD-10-CM | POA: Diagnosis not present

## 2022-01-27 DIAGNOSIS — E785 Hyperlipidemia, unspecified: Secondary | ICD-10-CM | POA: Diagnosis not present

## 2022-01-27 DIAGNOSIS — C772 Secondary and unspecified malignant neoplasm of intra-abdominal lymph nodes: Secondary | ICD-10-CM | POA: Insufficient documentation

## 2022-01-27 DIAGNOSIS — Z7951 Long term (current) use of inhaled steroids: Secondary | ICD-10-CM | POA: Insufficient documentation

## 2022-01-27 DIAGNOSIS — Z87442 Personal history of urinary calculi: Secondary | ICD-10-CM | POA: Insufficient documentation

## 2022-01-27 DIAGNOSIS — C669 Malignant neoplasm of unspecified ureter: Secondary | ICD-10-CM

## 2022-01-27 DIAGNOSIS — C679 Malignant neoplasm of bladder, unspecified: Secondary | ICD-10-CM | POA: Insufficient documentation

## 2022-01-27 DIAGNOSIS — M129 Arthropathy, unspecified: Secondary | ICD-10-CM | POA: Insufficient documentation

## 2022-01-27 DIAGNOSIS — N1330 Unspecified hydronephrosis: Secondary | ICD-10-CM | POA: Insufficient documentation

## 2022-01-27 DIAGNOSIS — T451X5A Adverse effect of antineoplastic and immunosuppressive drugs, initial encounter: Secondary | ICD-10-CM | POA: Diagnosis not present

## 2022-01-27 DIAGNOSIS — Z85828 Personal history of other malignant neoplasm of skin: Secondary | ICD-10-CM | POA: Insufficient documentation

## 2022-01-27 DIAGNOSIS — Z87891 Personal history of nicotine dependence: Secondary | ICD-10-CM | POA: Insufficient documentation

## 2022-01-27 DIAGNOSIS — N183 Chronic kidney disease, stage 3 unspecified: Secondary | ICD-10-CM | POA: Diagnosis not present

## 2022-01-27 DIAGNOSIS — I10 Essential (primary) hypertension: Secondary | ICD-10-CM | POA: Diagnosis not present

## 2022-01-27 DIAGNOSIS — I700 Atherosclerosis of aorta: Secondary | ICD-10-CM | POA: Diagnosis not present

## 2022-01-27 DIAGNOSIS — Z79899 Other long term (current) drug therapy: Secondary | ICD-10-CM | POA: Diagnosis not present

## 2022-01-27 DIAGNOSIS — J432 Centrilobular emphysema: Secondary | ICD-10-CM | POA: Insufficient documentation

## 2022-01-27 DIAGNOSIS — Z8616 Personal history of COVID-19: Secondary | ICD-10-CM | POA: Diagnosis not present

## 2022-01-27 NOTE — Progress Notes (Signed)
Radiation Oncology         ([phone]) [phone] ________________________________  Initial outpatient Consultation  Name: Mary Stark MRN: [id number]  Date of Service: 01/27/2022 DOB: 10/27/1956  W[place]:OMBTDH, Caryl Asp, NP  Wyatt Portela, MD   REFERRING PHYSICIAN: Wyatt Portela, MD  DIAGNOSIS: 66 y/o female with an enlarging abdominal lymph node along the right psoas muscle secondary to oligoprogression of metastatic adenocarcinoma of the bladder.    ICD-10-CM   1. Malignant neoplasm of ureter, unspecified laterality (HCC)  C66.9       HISTORY OF PRESENT ILLNESS: Mary Stark is a 66 y.o. female seen at the request of Dr. Alen Blew. In summary, she initially presented to urology with asymptomatic right hydronephrosis in 04/2020 and underwent cystoscopy with ureteroscopy and biopsies on 06/11/20 and 07/05/20. Pathology and cytology from the procedures showed atypical cells, suspicious for malignancy but nondiagnostic. CT A/P performed at [facility] in 08/2020 showed progressive findings in the right intrarenal collecting system and retroperitoneal lymph nodes with confluent soft tissue and was treated for suspected retroperitoneal fibrosis. She presented to the ED on 10/22/20 with fever, confusion, and sepsis. CT scans performed on admission for work up of her symptoms showed extensive adenopathy and stable abnormal hyperdensity in the right intrarenal collecting system. Biopsy of a left retroperitoneal lymph node was performed on 10/24/20 and revealed metastatic carcinoma consistent with either GU or Gyn malignancy. She was referred to Dr. Alen Blew and started chemotherapy with carboplatin/gemcitabine on 11/22/20. After several cycles of treatment, she underwent repeat cystoscopy on 01/21/21 with TURBT of a bladder lesion noted on repeat in office cystoscopy as well. Pathology from the bladder neck and left ureteral oriface biopsies confirmed adenocarcinoma of the bladder, with signet ring and poorly  differentiated plasmacytoid cells. She completed 6 cycles of systemic therapy with carboplatin/gemcitabine on 03/06/21 but follow up CT scans noted a mixed response overall. Therefore, she was switched to FOLFOX chemotherapy on 04/14/21. Follow up scans confirmed a good response to treatment so she completed 12 cycles prior to transitioning to 5-FU leucovorin maintenance therapy in 10/2021.  She recently underwent restaging CT C/A/P on 01/08/22 showing stable, moderate bilateral hydronephrosis with slight interval enlargement of a hypodense soft tissue nodule or lymph node, overlying the right psoas, measuring 1.4 cm (previously 1 cm) and stable appearance of the retroperitoneal and iliac nodes. There was no other evidence of new or progressive metastatic disease.  The patient has reviewed the scan results with her oncologist and has been kindly referred to [place] for discussion of potential radiation treatment options.  PREVIOUS RADIATION THERAPY: No  PAST MEDICAL HISTORY:  Past Medical History:  Diagnosis Date   Adenocarcinoma of bladder, stage 4 ([place]) 10/2020   oncologist--- dr Alen Blew and urologist-- dr pace;  w/ mets , started chemo 12/ 2021 and bilateral ureteral stents to treat hydronephrosis   Alcohol dependence ([place])    drank heavily years ago doesn't now 10/22/2021   Anemia due to chemotherapy    Anxiety    Arthritis    Back and lt knee   CKD (chronic kidney disease), stage III [facility])    COVID    August 2021, was in hospital and was on Oxygen for 11 weeks lungs still look like glass. 10/22/2021   Family history of adverse reaction to anesthesia    father had a "tight" airway   GERD (gastroesophageal reflux disease)    History of 2019 novel coronavirus disease (COVID-19) 08/13/2020   positive result in epic 08-19-2020,  hospital admission in epic,  covid pneumonia with hypoxia respiratory failure, discharged with oxygen for 6 weeks   History of chemotherapy last done 05-13-2021   History of  colitis 03/2018   acute colitis with sepsis   History of hepatitis C    TX FOR 2012   History of kidney stones    History of rheumatic fever as a child    per pt no valvular issues   History of sepsis 10/2020   hospital admission in epic due to pyelonephritis due to hydronephrosis, resolved   History of skin cancer    excision from nose   Hyperlipidemia    Hypertension    MDD (major depressive disorder)    Pneumonia    august 2022   Seasonal allergies    Sepsis ([place])    uro sepsis June 2022   Vitamin D deficiency       PAST SURGICAL HISTORY: Past Surgical History:  Procedure Laterality Date   ANAL RECTAL MANOMETRY N/A 01/31/2020   Procedure: ANO RECTAL MANOMETRY;  Surgeon: Arta Silence, MD;  Location: [facility];  Service: Endoscopy;  Laterality: N/A;   CESAREAN SECTION  1984   CYSTOSCOPY W/ URETERAL STENT PLACEMENT Bilateral 01/21/2021   Procedure: CYSTOSCOPY WITH STENT REPLACEMENT;  Surgeon: Robley Fries, MD;  Location: [facility];  Service: Urology;  Laterality: Bilateral;  1 HR   CYSTOSCOPY W/ URETERAL STENT PLACEMENT Bilateral 05/20/2021   Procedure: CYSTOSCOPY WITH RETROGRADE PYELOGRAM/URETERAL STENT EXCHANGE;  Surgeon: Robley Fries, MD;  Location: [facility];  Service: Urology;  Laterality: Bilateral;   CYSTOSCOPY W/ URETERAL STENT PLACEMENT Bilateral 10/28/2021   Procedure: CYSTOSCOPY WITH RETROGRADE PYELOGRAM/URETERAL STENT EXCHANGE;  Surgeon: Robley Fries, MD;  Location[facility];  Service: Urology;  Laterality: Bilateral;   CYSTOSCOPY WITH RETROGRADE PYELOGRAM, URETEROSCOPY AND STENT PLACEMENT Bilateral 06/11/2020   Procedure: CYSTOSCOPY WITH RETROGRADE PYELOGRAM, URETEROSCOPY AND STENT PLACEMENT, RIGHT URETERAL BIOPSY;  Surgeon: Robley Fries, MD;  Location: [facility];  Service: Urology;  Laterality: Bilateral;  71 MINS   CYSTOSCOPY WITH RETROGRADE PYELOGRAM, URETEROSCOPY AND STENT PLACEMENT Bilateral  07/05/2020   Procedure: CYSTOSCOPY WITH RETROGRADE PYELOGRAM, URETEROSCOPY,  BIOPSIES AND STENT EXCHANGES;  Surgeon: Robley Fries, MD;  Location: [facility];  Service: Urology;  Laterality: Bilateral;   FINGER SURGERY Right    5th   GYNECOLOGIC CRYOSURGERY  YRS AGO   IR IMAGING GUIDED PORT INSERTION  11/18/2020   TONSILLECTOMY  AS CHILD   TRANSURETHRAL RESECTION OF BLADDER TUMOR  01/21/2021   Procedure: TRANSURETHRAL RESECTION OF BLADDER TUMOR (TURBT);  Surgeon: Robley Fries, MD;  Location: [facility];  Service: Urology;;    FAMILY HISTORY:  Family History  Problem Relation Age of Onset   Ovarian cancer Mother    Cardiomyopathy Father    Valvular heart disease Father    Liver cancer Brother    Protein C deficiency Brother    Protein C deficiency Brother    Stroke Brother     SOCIAL HISTORY:  Social History   Socioeconomic History   Marital status: Married    Spouse name: Not on file   Number of children: Not on file   Years of education: Not on file   Highest education level: Not on file  Occupational History   Not on file  Tobacco Use   Smoking status: Former    Packs/day: 1.00    Years: 25.00    Pack years: 25.00  Types: Cigarettes    Quit date: 2012    Years since quitting: 11.1   Smokeless tobacco: Never  Vaping Use   Vaping Use: Every day   Substances: Nicotine  Substance and Sexual Activity   Alcohol use: Yes    Comment: occ wine   Drug use: No   Sexual activity: Not on file  Other Topics Concern   Not on file  Social History Narrative   Not on file   Social Determinants of Health   Financial Resource Strain: Medium Risk   Difficulty of Paying Living Expenses: Somewhat hard  Food Insecurity: No Food Insecurity   Worried About Running Out of Food in the Last Year: Never true   Ran Out of Food in the Last Year: Never true  Transportation Needs: No Transportation Needs   Lack of Transportation (Medical): No    Lack of Transportation (Non-Medical): No  Physical Activity: Not on file  Stress: No Stress Concern Present   Feeling of Stress : Not at all  Social Connections: Moderately Integrated   Frequency of Communication with Friends and Family: More than three times a week   Frequency of Social Gatherings with Friends and Family: Never   Attends Religious Services: Never   Marine scientist or Organizations: No   Attends Music therapist: 1 to 4 times per year   Marital Status: Married  Human resources officer Violence: Not on file    ALLERGIES: Metformin and related, Penicillins, Metformin, and Penicillin g sodium  MEDICATIONS:  Current Outpatient Medications  Medication Sig Dispense Refill   acetaminophen (TYLENOL) 500 MG tablet Take 625 mg by mouth every 8 (eight) hours as needed for moderate pain.     albuterol (PROAIR HFA) 108 (90 Base) MCG/ACT inhaler INHALE 1 TO 2 PUFFS BY MOUTH INTO THE LUNGS EVERY 4 HOURS AS NEEDED FOR WHEEZING OR SHORTNESS OF BREATH 8 g 1   amLODipine (NORVASC) 5 MG tablet Take 5 mg by mouth as needed.     ARIPiprazole (ABILIFY) 10 MG tablet TAKE 1 TABLET BY MOUTH ONCE DAILY 90 tablet 0   ascorbic acid (VITAMIN C) 500 MG tablet Take 1,000 mg by mouth every evening.      azelastine (ASTELIN) 0.1 % nasal spray Place 2 sprays into both nostrils 2 (two) times daily. Use in each nostril as directed 301 mL 1   benzonatate (TESSALON) 200 MG capsule Take 1 capsule (200 mg total) by mouth 3 (three) times daily as needed for cough. 30 capsule 0   buPROPion (WELLBUTRIN XL) 300 MG 24 hr tablet TAKE 1 TABLET BY MOUTH EVERY DAY 30 tablet 3   cephALEXin (KEFLEX) 250 MG capsule Take 1 capsule by mouth nightly as directed 90 capsule 1   Cholecalciferol (VITAMIN D) 125 MCG (5000 UT) CAPS Take 5,000 Units by mouth every evening.      clonazePAM (KLONOPIN) 1 MG tablet Take 1 tablet (1 mg total) by mouth at bedtime. 30 tablet 2   Coenzyme Q10 (CO Q-10) 200 MG CAPS Take 200 mg  by mouth every evening.      cycloSPORINE (RESTASIS) 0.05 % ophthalmic emulsion Place 1 drop both eyes twice a day 180 each 3   escitalopram (LEXAPRO) 5 MG tablet Take 1 tablet by mouth daily. 30 tablet 3   fluticasone-salmeterol (ADVAIR HFA) 115-21 MCG/ACT inhaler Inhale 2 puffs into the lungs 2 (two) times daily. (Patient taking differently: Inhale 2 puffs into the lungs 2 (two) times daily. Havent had it filled)  1 each 0   gabapentin (NEURONTIN) 300 MG capsule TAKE 1 CAPSULE BY MOUTH IN THE MORNING, 1 CAPSULE IN THE AFTERNOON & 4 CAPSULES AT NIGHT (Patient taking differently: Take 300 mg by mouth 4 (four) times daily.) 180 capsule 3   GLUCOSAMINE-CHONDROITIN PO Take 2 tablets by mouth every evening.      lidocaine-prilocaine (EMLA) cream Apply 1 application topically as needed. 30 g 0   Meth-Hyo-M Bl-Na Phos-Ph Sal (URIBEL) 118 MG CAPS Take one capsule by mouth 3 times daily (Patient taking differently: as needed.) 30 capsule 3   mirabegron ER (MYRBETRIQ) 25 MG TB24 tablet TAKE 1 TABLET BY MOUTH ONCE A DAY 30 tablet 3   Nebivolol HCl 20 MG TABS Take 1/2 tablet (10 mg total) by mouth daily. 90 tablet 1   nitrofurantoin, macrocrystal-monohydrate, (MACROBID) 100 MG capsule Take 1 capsule (100 mg total) by mouth 2 (two) times daily. 10 capsule 0   Omega-3 Fatty Acids (FISH OIL) 1200 MG CAPS Take 1,200 mg by mouth every evening.      omeprazole (PRILOSEC) 20 MG capsule Take 1 capsule (20 mg total) by mouth daily. 90 capsule 3   ondansetron (ZOFRAN) 4 MG tablet Take 1 tablet (4 mg total) by mouth every 8 (eight) hours as needed for nausea or vomiting. 40 tablet 3   oxybutynin (DITROPAN) 5 MG tablet TAKE 1 TABLET BY MOUTH EVERY 8 HOURS AS NEEDED 90 tablet 3   oxymetazoline (AFRIN) 0.05 % nasal spray Place 1 spray into both nostrils 2 (two) times daily as needed for congestion. For nose bleeds     Phenazopyridine HCl (AZO URINARY PAIN PO) Take 2 tablets by mouth 3 (three) times daily as needed (bladder  spasms).     simvastatin (ZOCOR) 20 MG tablet TAKE 1 TABLET (20 MG TOTAL) BY MOUTH DAILY. (Patient taking differently: Take 20 mg by mouth at bedtime.) 90 tablet 3   tamsulosin (FLOMAX) 0.4 MG CAPS capsule TAKE 1 CAPSULE BY MOUTH AT BEDTIME 30 capsule 3   thiamine 100 MG tablet Take 1 tablet (100 mg total) by mouth daily. 30 tablet 0   traMADol (ULTRAM) 50 MG tablet Take 1 tablet (50 mg total) by mouth 3 (three) times daily as needed. 30 tablet 3   vitamin B-12 (CYANOCOBALAMIN) 1000 MCG tablet Take 2,000 mcg by mouth every evening.      Vitamin E 180 MG (400 UNIT) CAPS Take 400 Units by mouth every evening.      No current facility-administered medications for this encounter.    REVIEW OF SYSTEMS:  On review of systems, the patient reports that she is doing well overall. She denies any chest pain, shortness of breath, cough, fevers, chills, night sweats, unintended weight changes. She denies any bowel or bladder disturbances, and denies abdominal pain, nausea or vomiting. She denies any new musculoskeletal or joint aches or pains. A complete review of systems is obtained and is otherwise negative.  PHYSICAL EXAM:  Wt Readings from Last 3 Encounters:  01/27/22 179 lb (81.2 kg)  01/16/22 177 lb (80.3 kg)  01/15/22 177 lb 12.8 oz (80.6 kg)   Temp Readings from Last 3 Encounters:  01/27/22 98 F (36.7 C)  01/17/22 97.9 F (36.6 C) (Temporal)  01/15/22 98 F (36.7 C) (Axillary)   BP Readings from Last 3 Encounters:  01/27/22 128/76  01/17/22 133/83  01/16/22 (!) 143/83   Pulse Readings from Last 3 Encounters:  01/27/22 68  01/17/22 96  01/16/22 69    /10  In general this is a well appearing Caucasian female in no acute distress. She 's alert and oriented x4 and appropriate throughout the examination. Cardiopulmonary assessment is negative for acute distress and she exhibits normal effort.   KPS = 100  100 - Normal; no complaints; no evidence of disease. 90   - Able to carry on  normal activity; minor signs or symptoms of disease. 80   - Normal activity with effort; some signs or symptoms of disease. 53   - Cares for self; unable to carry on normal activity or to do active work. 60   - Requires occasional assistance, but is able to care for most of his personal needs. 50   - Requires considerable assistance and frequent medical care. 58   - Disabled; requires special care and assistance. 72   - Severely disabled; hospital admission is indicated although death not imminent. 44   - Very sick; hospital admission necessary; active supportive treatment necessary. 10   - Moribund; fatal processes progressing rapidly. 0     - Dead  Karnofsky DA, Abelmann De [place]ff, Craver LS and Burchenal [facility] [phone]) The use of the nitrogen mustards in the palliative treatment of carcinoma: with particular reference to bronchogenic carcinoma Cancer 1 [id number]  LABORATORY DATA:  Lab Results  Component Value Date   WBC 7.9 01/15/2022   HGB 9.0 (L) 01/15/2022   HCT 26.5 (L) 01/15/2022   MCV 111.8 (H) 01/15/2022   PLT 145 (L) 01/15/2022   Lab Results  Component Value Date   NA 135 01/15/2022   K 4.4 01/15/2022   CL 102 01/15/2022   CO2 26 01/15/2022   Lab Results  Component Value Date   ALT 6 01/15/2022   AST 10 (L) 01/15/2022   ALKPHOS 87 01/15/2022   BILITOT 0.5 01/15/2022     RADIOGRAPHY: CT CHEST ABDOMEN PELVIS W CONTRAST  Result Date: 01/09/2022 CLINICAL DATA:  Bladder cancer restaging EXAM: CT CHEST, ABDOMEN, AND PELVIS WITH CONTRAST TECHNIQUE: Multidetector CT imaging of the chest, abdomen and pelvis was performed following the standard protocol during bolus administration of intravenous contrast. RADIATION DOSE REDUCTION: This exam was performed according to the departmental dose-optimization program which includes automated exposure control, adjustment of the mA and/or kV according to patient size and/or use of iterative reconstruction technique. CONTRAST:  174mL OMNIPAQUE IOHEXOL  300 MG/ML SOLN, additional oral enteric contrast COMPARISON:  10/23/2021, 08/22/2021 FINDINGS: CT CHEST FINDINGS Cardiovascular: Right chest port catheter. Scattered aortic atherosclerosis. Normal heart size. No pericardial effusion. Mediastinum/Nodes: No enlarged mediastinal, hilar, or axillary lymph nodes. Thyroid gland, trachea, and esophagus demonstrate no significant findings. Lungs/Pleura: Unchanged, arcing, bandlike irregular peripheral interstitial opacity with subpleural sparing. Unchanged 0.4 cm nodule of the dependent right lower lobe (series 6, image 85). Minimal centrilobular emphysema. No pleural effusion or pneumothorax. Musculoskeletal: No chest wall mass or suspicious osseous lesions identified. CT ABDOMEN PELVIS FINDINGS Hepatobiliary: No solid liver abnormality is seen. No gallstones, gallbladder wall thickening, or biliary dilatation. Pancreas: Unremarkable. No pancreatic ductal dilatation or surrounding inflammatory changes. Spleen: Normal in size without significant abnormality. Adrenals/Urinary Tract: Adrenal glands are unremarkable. Bilateral double-J ureteral stents remain in position, with formed pigtails in the renal pelves and bladder. Unchanged, moderate bilateral hydronephrosis. Stomach/Bowel: Stomach is within normal limits. Appendix appears normal. No evidence of bowel wall thickening, distention, or inflammatory changes. Vascular/Lymphatic: Aortic atherosclerosis. Multiple small matted retroperitoneal and iliac nodes are unchanged, for example a left retroperitoneal node measuring 1.1 x 0.9 cm (series 2, image 68) and  a left external iliac node measuring 0.6 cm (series 2, image 81). There has been slight interval enlargement a hypodense soft tissue nodule or lymph node overlying the right psoas, measuring 1.4 x 1.3 cm, previously 1.0 x 1.0 cm (series 2, image 83). Reproductive: No mass or other abnormality. Other: No abdominal wall hernia or abnormality. No ascites. Musculoskeletal:  No acute osseous findings. IMPRESSION: 1. Bilateral double-J ureteral stents remain in position, with formed pigtails in the renal pelves and bladder. Unchanged, moderate bilateral hydronephrosis. 2. There has been slight interval enlargement of a hypodense soft tissue nodule or lymph node overlying the right psoas, measuring 1.4 x 1.3 cm, previously 1.0 x 1.0 cm. This is worrisome for metastatic disease. 3. Multiple additional small, matted retroperitoneal and iliac nodes are unchanged, consistent with treated nodal metastases. 4. No other evidence of new metastatic disease in the chest, abdomen, or pelvis. 5. Unchanged 0.4 cm nodule of the dependent right lower lobe, as on prior examination almost certainly benign and incidental. Attention on follow-up. 6. Unchanged, arcing, bandlike irregular peripheral interstitial opacity with subpleural sparing throughout the lungs, consistent with sequelae of prior COVID pneumonia. 7. Emphysema. Aortic Atherosclerosis (ICD10-I70.0) and Emphysema (ICD10-J43.9). Electronically Signed   By: Delanna Ahmadi M.D.   On: 01/09/2022 15:15      IMPRESSION/PLAN: 1. 66 y.o. female with an enlarging abdominal lymph node along the right psoas muscle secondary to oligoprogression of metastatic adenocarcinoma of the bladder. Today, we talked to the patient and family about the findings and workup thus far. We discussed the natural history of metastatic bladder adenocarcinoma and general treatment, highlighting the role of radiotherapy in the management of oligoprogression. We discussed the available radiation techniques, and focused on the details and logistics of delivery. Since her disease appears to be stable aside from this enlarging lymph node along the right psoas, this is consistent with oligo-progression and therefore the recommendation is to proceed with a 5 fraction course of SBRT targeting the enlarging node. We reviewed the anticipated acute and late sequelae associated with  radiation in this setting. The patient was encouraged to ask questions that were answered to her stated satisfaction.  At the end of our discussion, she is in agreement to proceed with the recommended 5 fraction course of SBRT targeting the enlarging node. She has provided verbal consent today and will be scheduled for CT SIM on 02/03/22 in anticipation of beginning her treatments in the near future. She appears to have a good understanding of her disease and our treatment recommendations so we will share our discussion with Dr. Alen Blew and move forward with treatment planning accordingly. She will continue her current systemic therapy and has our contact information and knows that she is welcome to call with any questions or concerns in the interim. We enjoyed meeting her today and look forward to continuing to participate in her care.  We personally spent 70 minutes in this encounter including chart review, reviewing radiological studies, meeting face-to-face with the patient, entering orders and completing documentation.    Nicholos Johns, PA-C    Tyler Pita, MD  [facility] Direct Dial: [phone]   Fax: [phone] [email or web address]   Skype   [email or web address]   This document serves as a record of services personally performed by Tyler Pita, MD and Freeman Caldron, PA-C. It was created on their behalf by Wilburn Mylar, a trained medical scribe. The creation of this record is based on the scribe's personal observations and the provider's  statements to them. This document has been checked and approved by the attending provider.

## 2022-01-27 NOTE — Progress Notes (Signed)
[name] OFFICE PROGRESS NOTE  Mary Bouche, NP [address] [place] [address]  DIAGNOSIS: 66 year old woman with stage IV bladder cancer diagnosed in November 2021.  She was found to have 2 IV adenocarcinoma with abdominal and pelvic adenopathy.    PRIOR THERAPY:  1) She is status post retroperitoneal lymph node biopsy completed on 10/24/2020 which showed metastatic carcinoma.   2) She is status post repeat cystoscopy and bilateral ureteral stent exchange as well as resection of a bladder tumor on January 21, 2021.  A final pathology showed an adenocarcinoma.   3) Chemotherapy utilizing carboplatin and gemcitabine started on November 22, 2020.  She completed 6 cycles of therapy on March 06, 2021.   4) FOLFOX chemotherapy started on Apr 15, 2021.  She completed 12 cycles of therapy.  CURRENT THERAPY:  1) 5-FU and leucovorin chemotherapy every 2 weeks started in November 2022.  She returns for the next cycle of therapy. 2) radiation to the enlarging soft tissue nodule in the right psoas muscle under the care of Mary Stark.  Expected to start on 02/03/22.   INTERVAL HISTORY: Mary Stark 66 y.o. female returns to the clinic today for a follow-up visit.  The patient was last seen by Mary Stark on 01/15/2022.  At that point in time, the patient had a restaging scan which showed a new enlarging soft tissue nodule in the right psoas muscle.  The patient saw radiation oncology for consultation on 01/27/2022 and they are planning on starting SBRT on 02/03/22.    Otherwise the patient denies any new concerning complaints today.  She denies any fever, chills, night sweats, or unexplained weight loss.  She denies any chest pain, shortness of breath, cough, or hemoptysis.  Denies any dysuria, hematuria, or incontinence. She had a UA performed yesterday by urology which was reportedly good. She reports the new stents she has in are working great. Denies any vomiting.  She reports she has softer stools but denies loose stools. Denies any abdominal pain. She has some fatigue a few days following treatment and mild nausea which is controlled with her anti-emetic. The patient is here today for evaluation and repeat blood work before starting her next cycle of treatment.     MEDICAL HISTORY: Past Medical History:  Diagnosis Date   Adenocarcinoma of bladder, stage 4 ([place]) 10/2020   oncologist--- Mary Alen Stark and urologist-- Mary Stark;  w/ mets , started chemo 12/ 2021 and bilateral ureteral stents to treat hydronephrosis   Alcohol dependence ([place])    drank heavily years ago doesn't now 10/22/2021   Anemia due to chemotherapy    Anxiety    Arthritis    Back and lt knee   CKD (chronic kidney disease), stage III [facility])    COVID    August 2021, was in hospital and was on Oxygen for 11 weeks lungs still look like glass. 10/22/2021   Family history of adverse reaction to anesthesia    father had a "tight" airway   GERD (gastroesophageal reflux disease)    History of 2019 novel coronavirus disease (COVID-19) 08/13/2020   positive result in epic 08-19-2020,  hospital admission in epic,  covid pneumonia with hypoxia respiratory failure, discharged with oxygen for 6 weeks   History of chemotherapy last done 05-13-2021   History of colitis 03/2018   acute colitis with sepsis   History of hepatitis C    TX FOR 2012   History of kidney stones  History of rheumatic fever as a child    per pt no valvular issues   History of sepsis 10/2020   hospital admission in epic due to pyelonephritis due to hydronephrosis, resolved   History of skin cancer    excision from nose   Hyperlipidemia    Hypertension    MDD (major depressive disorder)    Pneumonia    august 2022   Seasonal allergies    Sepsis ([place])    uro sepsis June 2022   Vitamin D deficiency     ALLERGIES:  is allergic to metformin and related, penicillins, metformin, and penicillin g sodium.  MEDICATIONS:   Current Outpatient Medications  Medication Sig Dispense Refill   acetaminophen (TYLENOL) 500 MG tablet Take 625 mg by mouth every 8 (eight) hours as needed for moderate pain.     albuterol (PROAIR HFA) 108 (90 Base) MCG/ACT inhaler INHALE 1 TO 2 PUFFS BY MOUTH INTO THE LUNGS EVERY 4 HOURS AS NEEDED FOR WHEEZING OR SHORTNESS OF BREATH 8 g 1   amLODipine (NORVASC) 5 MG tablet Take 5 mg by mouth as needed.     ARIPiprazole (ABILIFY) 10 MG tablet TAKE 1 TABLET BY MOUTH ONCE DAILY 90 tablet 0   ascorbic acid (VITAMIN C) 500 MG tablet Take 1,000 mg by mouth every evening.      azelastine (ASTELIN) 0.1 % nasal spray Place 2 sprays into both nostrils 2 (two) times daily. Use in each nostril as directed 301 mL 1   benzonatate (TESSALON) 200 MG capsule Take 1 capsule (200 mg total) by mouth 3 (three) times daily as needed for cough. 30 capsule 0   buPROPion (WELLBUTRIN XL) 300 MG 24 hr tablet TAKE 1 TABLET BY MOUTH EVERY DAY 30 tablet 3   cephALEXin (KEFLEX) 250 MG capsule Take 1 capsule by mouth nightly as directed 90 capsule 1   Cholecalciferol (VITAMIN D) 125 MCG (5000 UT) CAPS Take 5,000 Units by mouth every evening.      clonazePAM (KLONOPIN) 1 MG tablet Take 1 tablet (1 mg total) by mouth at bedtime. 30 tablet 2   Coenzyme Q10 (CO Q-10) 200 MG CAPS Take 200 mg by mouth every evening.      cycloSPORINE (RESTASIS) 0.05 % ophthalmic emulsion Place 1 drop both eyes twice a day 180 each 3   escitalopram (LEXAPRO) 5 MG tablet Take 1 tablet by mouth daily. 30 tablet 3   fluticasone-salmeterol (ADVAIR HFA) 115-21 MCG/ACT inhaler Inhale 2 puffs into the lungs 2 (two) times daily. (Patient taking differently: Inhale 2 puffs into the lungs 2 (two) times daily. Havent had it filled) 1 each 0   gabapentin (NEURONTIN) 300 MG capsule TAKE 1 CAPSULE BY MOUTH IN THE MORNING, 1 CAPSULE IN THE AFTERNOON & 4 CAPSULES AT NIGHT (Patient taking differently: Take 300 mg by mouth 4 (four) times daily.) 180 capsule 3    GLUCOSAMINE-CHONDROITIN PO Take 2 tablets by mouth every evening.      lidocaine-prilocaine (EMLA) cream Apply 1 application topically as needed. 30 g 0   Meth-Hyo-M Bl-Na Phos-Ph Sal (URIBEL) 118 MG CAPS Take one capsule by mouth 3 times daily (Patient taking differently: as needed.) 30 capsule 3   mirabegron ER (MYRBETRIQ) 25 MG TB24 tablet TAKE 1 TABLET BY MOUTH ONCE A DAY 30 tablet 3   Nebivolol HCl 20 MG TABS Take 1/2 tablet (10 mg total) by mouth daily. 90 tablet 1   nitrofurantoin, macrocrystal-monohydrate, (MACROBID) 100 MG capsule Take 1 capsule (100 mg total)  by mouth 2 (two) times daily. 10 capsule 0   Omega-3 Fatty Acids (FISH OIL) 1200 MG CAPS Take 1,200 mg by mouth every evening.      omeprazole (PRILOSEC) 20 MG capsule Take 1 capsule (20 mg total) by mouth daily. 90 capsule 3   ondansetron (ZOFRAN) 4 MG tablet Take 1 tablet (4 mg total) by mouth every 8 (eight) hours as needed for nausea or vomiting. 40 tablet 3   oxybutynin (DITROPAN) 5 MG tablet TAKE 1 TABLET BY MOUTH EVERY 8 HOURS AS NEEDED 90 tablet 3   oxymetazoline (AFRIN) 0.05 % nasal spray Place 1 spray into both nostrils 2 (two) times daily as needed for congestion. For nose bleeds     Phenazopyridine HCl (AZO URINARY PAIN PO) Take 2 tablets by mouth 3 (three) times daily as needed (bladder spasms).     simvastatin (ZOCOR) 20 MG tablet TAKE 1 TABLET (20 MG TOTAL) BY MOUTH DAILY. (Patient taking differently: Take 20 mg by mouth at bedtime.) 90 tablet 3   tamsulosin (FLOMAX) 0.4 MG CAPS capsule TAKE 1 CAPSULE BY MOUTH AT BEDTIME 30 capsule 3   thiamine 100 MG tablet Take 1 tablet (100 mg total) by mouth daily. 30 tablet 0   traMADol (ULTRAM) 50 MG tablet Take 1 tablet (50 mg total) by mouth 3 (three) times daily as needed. 30 tablet 3   vitamin B-12 (CYANOCOBALAMIN) 1000 MCG tablet Take 2,000 mcg by mouth every evening.      Vitamin E 180 MG (400 UNIT) CAPS Take 400 Units by mouth every evening.      No current  facility-administered medications for this visit.    SURGICAL HISTORY:  Past Surgical History:  Procedure Laterality Date   ANAL RECTAL MANOMETRY N/A 01/31/2020   Procedure: ANO RECTAL MANOMETRY;  Surgeon: Arta Silence, MD;  Location: [facility];  Service: Endoscopy;  Laterality: N/A;   CESAREAN SECTION  1984   CYSTOSCOPY W/ URETERAL STENT PLACEMENT Bilateral 01/21/2021   Procedure: CYSTOSCOPY WITH STENT REPLACEMENT;  Surgeon: Robley Fries, MD;  Location[facility];  Service: Urology;  Laterality: Bilateral;  1 HR   CYSTOSCOPY W/ URETERAL STENT PLACEMENT Bilateral 05/20/2021   Procedure: CYSTOSCOPY WITH RETROGRADE PYELOGRAM/URETERAL STENT EXCHANGE;  Surgeon: Robley Fries, MD;  Location: [facility];  Service: Urology;  Laterality: Bilateral;   CYSTOSCOPY W/ URETERAL STENT PLACEMENT Bilateral 10/28/2021   Procedure: CYSTOSCOPY WITH RETROGRADE PYELOGRAM/URETERAL STENT EXCHANGE;  Surgeon: Robley Fries, MD;  Location: [facility];  Service: Urology;  Laterality: Bilateral;   CYSTOSCOPY WITH RETROGRADE PYELOGRAM, URETEROSCOPY AND STENT PLACEMENT Bilateral 06/11/2020   Procedure: CYSTOSCOPY WITH RETROGRADE PYELOGRAM, URETEROSCOPY AND STENT PLACEMENT, RIGHT URETERAL BIOPSY;  Surgeon: Robley Fries, MD;  Location: [facility];  Service: Urology;  Laterality: Bilateral;  63 MINS   CYSTOSCOPY WITH RETROGRADE PYELOGRAM, URETEROSCOPY AND STENT PLACEMENT Bilateral 07/05/2020   Procedure: CYSTOSCOPY WITH RETROGRADE PYELOGRAM, URETEROSCOPY,  BIOPSIES AND STENT EXCHANGES;  Surgeon: Robley Fries, MD;  Location: [facility];  Service: Urology;  Laterality: Bilateral;   FINGER SURGERY Right    5th   GYNECOLOGIC CRYOSURGERY  YRS AGO   IR IMAGING GUIDED PORT INSERTION  11/18/2020   TONSILLECTOMY  AS CHILD   TRANSURETHRAL RESECTION OF BLADDER TUMOR  01/21/2021   Procedure: TRANSURETHRAL RESECTION OF BLADDER TUMOR (TURBT);  Surgeon: Robley Fries, MD;  Location: [facility];  Service: Urology;;    REVIEW OF SYSTEMS:   Review  of Systems  Constitutional: Positive for fatigue. Negative for appetite change, chills, fever and unexpected weight change.  HENT: Negative for mouth sores, nosebleeds, sore throat and trouble swallowing.   Eyes: Negative for eye problems and icterus.  Respiratory: Negative for cough, hemoptysis, shortness of breath and wheezing.   Cardiovascular: Negative for chest pain and leg swelling.  Gastrointestinal: Positive for mild nausea following treatment. Negative for abdominal pain, constipation, diarrhea, and vomiting.  Genitourinary: Negative for bladder incontinence, difficulty urinating, dysuria, frequency and hematuria.   Musculoskeletal: Negative for back pain, gait problem, neck pain and neck stiffness.  Skin: Negative for itching and rash.  Neurological: Negative for dizziness, extremity weakness, gait problem, headaches, light-headedness and seizures.  Hematological: Negative for adenopathy. Does not bruise/bleed easily.  Psychiatric/Behavioral: Negative for confusion, depression and sleep disturbance. The patient is not nervous/anxious.     PHYSICAL EXAMINATION:  Blood pressure 121/69, pulse 73, temperature (!) 97.4 F (36.3 C), temperature source Tympanic, resp. rate 18, weight 178 lb 8 oz (81 kg), SpO2 97 %.  ECOG PERFORMANCE STATUS: 1  Physical Exam  Constitutional: Oriented to person, place, and time and well-developed, well-nourished, and in no distress.  HENT:  Head: Normocephalic and atraumatic.  Mouth/Throat: Oropharynx is clear and moist. No oropharyngeal exudate.  Eyes: Conjunctivae are normal. Right eye exhibits no discharge. Left eye exhibits no discharge. No scleral icterus.  Neck: Normal range of motion. Neck supple.  Cardiovascular: Normal rate, regular rhythm, normal heart sounds and intact distal pulses.   Pulmonary/Chest: Effort normal and breath sounds  normal. No respiratory distress. No wheezes. No rales.  Abdominal: Soft. Bowel sounds are normal. Exhibits no distension and no mass. There is no tenderness.  Musculoskeletal: Normal range of motion. Exhibits no edema.  Lymphadenopathy:    No cervical adenopathy.  Neurological: Alert and oriented to person, place, and time. Exhibits normal muscle tone. Gait normal. Coordination normal.  Skin: Skin is warm and dry. No rash noted. Not diaphoretic. No erythema. No pallor.  Psychiatric: Mood, memory and judgment normal.  Vitals reviewed.  LABORATORY DATA: Lab Results  Component Value Date   WBC 6.7 01/29/2022   HGB 9.1 (L) 01/29/2022   HCT 27.5 (L) 01/29/2022   MCV 112.7 (H) 01/29/2022   PLT 160 01/29/2022      Chemistry      Component Value Date/Time   NA 135 01/29/2022 0932   NA 142 04/26/2018 1333   K 4.6 01/29/2022 0932   CL 103 01/29/2022 0932   CO2 25 01/29/2022 0932   BUN 22 01/29/2022 0932   BUN 22 04/26/2018 1333   CREATININE 1.28 (H) 01/29/2022 0932   CREATININE 1.32 (H) 06/04/2021 0000   GLU 87 02/07/2013 0000      Component Value Date/Time   CALCIUM 9.1 01/29/2022 0932   ALKPHOS 90 01/29/2022 0932   AST 11 (L) 01/29/2022 0932   ALT 6 01/29/2022 0932   BILITOT 0.4 01/29/2022 0932       RADIOGRAPHIC STUDIES:  CT CHEST ABDOMEN PELVIS W CONTRAST  Result Date: 01/09/2022 CLINICAL DATA:  Bladder cancer restaging EXAM: CT CHEST, ABDOMEN, AND PELVIS WITH CONTRAST TECHNIQUE: Multidetector CT imaging of the chest, abdomen and pelvis was performed following the standard protocol during bolus administration of intravenous contrast. RADIATION DOSE REDUCTION: This exam was performed according to the departmental dose-optimization program which includes automated exposure control, adjustment of the mA and/or kV according to patient size and/or use of iterative reconstruction technique. CONTRAST:  140mL OMNIPAQUE IOHEXOL 300 MG/ML  SOLN, additional oral enteric contrast  COMPARISON:  10/23/2021, 08/22/2021 FINDINGS: CT CHEST FINDINGS Cardiovascular: Right chest port catheter. Scattered aortic atherosclerosis. Normal heart size. No pericardial effusion. Mediastinum/Nodes: No enlarged mediastinal, hilar, or axillary lymph nodes. Thyroid gland, trachea, and esophagus demonstrate no significant findings. Lungs/Pleura: Unchanged, arcing, bandlike irregular peripheral interstitial opacity with subpleural sparing. Unchanged 0.4 cm nodule of the dependent right lower lobe (series 6, image 85). Minimal centrilobular emphysema. No pleural effusion or pneumothorax. Musculoskeletal: No chest wall mass or suspicious osseous lesions identified. CT ABDOMEN PELVIS FINDINGS Hepatobiliary: No solid liver abnormality is seen. No gallstones, gallbladder wall thickening, or biliary dilatation. Pancreas: Unremarkable. No pancreatic ductal dilatation or surrounding inflammatory changes. Spleen: Normal in size without significant abnormality. Adrenals/Urinary Tract: Adrenal glands are unremarkable. Bilateral double-J ureteral stents remain in position, with formed pigtails in the renal pelves and bladder. Unchanged, moderate bilateral hydronephrosis. Stomach/Bowel: Stomach is within normal limits. Appendix appears normal. No evidence of bowel wall thickening, distention, or inflammatory changes. Vascular/Lymphatic: Aortic atherosclerosis. Multiple small matted retroperitoneal and iliac nodes are unchanged, for example a left retroperitoneal node measuring 1.1 x 0.9 cm (series 2, image 68) and a left external iliac node measuring 0.6 cm (series 2, image 81). There has been slight interval enlargement a hypodense soft tissue nodule or lymph node overlying the right psoas, measuring 1.4 x 1.3 cm, previously 1.0 x 1.0 cm (series 2, image 83). Reproductive: No mass or other abnormality. Other: No abdominal wall hernia or abnormality. No ascites. Musculoskeletal: No acute osseous findings. IMPRESSION: 1.  Bilateral double-J ureteral stents remain in position, with formed pigtails in the renal pelves and bladder. Unchanged, moderate bilateral hydronephrosis. 2. There has been slight interval enlargement of a hypodense soft tissue nodule or lymph node overlying the right psoas, measuring 1.4 x 1.3 cm, previously 1.0 x 1.0 cm. This is worrisome for metastatic disease. 3. Multiple additional small, matted retroperitoneal and iliac nodes are unchanged, consistent with treated nodal metastases. 4. No other evidence of new metastatic disease in the chest, abdomen, or pelvis. 5. Unchanged 0.4 cm nodule of the dependent right lower lobe, as on prior examination almost certainly benign and incidental. Attention on follow-up. 6. Unchanged, arcing, bandlike irregular peripheral interstitial opacity with subpleural sparing throughout the lungs, consistent with sequelae of prior COVID pneumonia. 7. Emphysema. Aortic Atherosclerosis (ICD10-I70.0) and Emphysema (ICD10-J43.9). Electronically Signed   By: Delanna Ahmadi M.D.   On: 01/09/2022 15:15     ASSESSMENT/PLAN:  66 year old woman with:   1.   Bladder cancer diagnosed in November 2021.  She developed stage IV adenocarcinoma with adenopathy.   The patient was last seen by Mary Stark to review the CT scan from January 08, 2022. Mary Stark had previously discussed it was stable except for enlargement of a soft tissue nodule of the right psoas muscle.  Mary Stark, recommended continuing maintenance chemotherapy with 5-FU leucovorin and to address the oligoprogression with radiation therapy. She was seen by Mary Stark who is planning to perform SBRT starting on 02/03/22. Mary. Hazeline Junker last note mentioned that switching systemic therapy could be considered if she has more systemic disease with the next scan.  He anticipates the next scan will be in 2 to 3 months. Labs were reviewed today which are acceptable for treatment. She will proceed today as scheduled.      2.  IV  access: Port-A-Cath continues to be in use without any issues.   3.  Antiemetics: Compazine is available to her and effective for  her mild nausea.    4.  Renal function surveillance: Renal failure was related to bilateral hydronephrosis with stents in place.  Kidney function remained stable with creatinine clearance around 50 cc/min.   5.  Goals of care: Her disease is palliative although aggressive measures are warranted.   6.  Anemia: Her hemoglobin is stable ~9 today  and does not require transfusion.  Her anemia is related to malignancy and chemotherapy.   7.  Thrombocytopenia: Resolved at this time and is predominantly related to oxaliplatin.  8. COVID-19 booster. She is inquiring if she can have her next COVID-19 booster. Recommend getting this on her off week of treatment. I would be happy to make an appointment here or she can go to her local pharmacy. She will get this at her local pharmacy.    8.  Follow-up: In 2 weeks for the next cycle of therapy.     The patient was advised to call immediately if she has any concerning symptoms in the interval. The patient voices understanding of current disease status and treatment options and is in agreement with the current care plan. All questions were answered. The patient knows to call the clinic with any problems, questions or concerns. We can certainly see the patient much sooner if necessary     No orders of the defined types were placed in this encounter.    The total time spent in the appointment was 20-29 minutes.   Sidney Silberman L Lean Fayson, PA-C 01/29/22

## 2022-01-28 DIAGNOSIS — C779 Secondary and unspecified malignant neoplasm of lymph node, unspecified: Secondary | ICD-10-CM | POA: Insufficient documentation

## 2022-01-29 ENCOUNTER — Other Ambulatory Visit: Payer: Self-pay

## 2022-01-29 ENCOUNTER — Inpatient Hospital Stay: Payer: No Typology Code available for payment source

## 2022-01-29 ENCOUNTER — Other Ambulatory Visit (HOSPITAL_COMMUNITY): Payer: Self-pay

## 2022-01-29 ENCOUNTER — Encounter: Payer: Self-pay | Admitting: Physician Assistant

## 2022-01-29 ENCOUNTER — Inpatient Hospital Stay (HOSPITAL_BASED_OUTPATIENT_CLINIC_OR_DEPARTMENT_OTHER): Payer: No Typology Code available for payment source | Admitting: Physician Assistant

## 2022-01-29 VITALS — BP 121/69 | HR 73 | Temp 97.4°F | Resp 18 | Wt 178.5 lb

## 2022-01-29 DIAGNOSIS — Z95828 Presence of other vascular implants and grafts: Secondary | ICD-10-CM

## 2022-01-29 DIAGNOSIS — C669 Malignant neoplasm of unspecified ureter: Secondary | ICD-10-CM | POA: Diagnosis not present

## 2022-01-29 DIAGNOSIS — Z5111 Encounter for antineoplastic chemotherapy: Secondary | ICD-10-CM

## 2022-01-29 DIAGNOSIS — C801 Malignant (primary) neoplasm, unspecified: Secondary | ICD-10-CM

## 2022-01-29 LAB — CBC WITH DIFFERENTIAL (CANCER CENTER ONLY)
Abs Immature Granulocytes: 0.03 10*3/uL (ref 0.00–0.07)
Basophils Absolute: 0 10*3/uL (ref 0.0–0.1)
Basophils Relative: 0 %
Eosinophils Absolute: 0.1 10*3/uL (ref 0.0–0.5)
Eosinophils Relative: 2 %
HCT: 27.5 % — ABNORMAL LOW (ref 36.0–46.0)
Hemoglobin: 9.1 g/dL — ABNORMAL LOW (ref 12.0–15.0)
Immature Granulocytes: 0 %
Lymphocytes Relative: 25 %
Lymphs Abs: 1.7 10*3/uL (ref 0.7–4.0)
MCH: 37.3 pg — ABNORMAL HIGH (ref 26.0–34.0)
MCHC: 33.1 g/dL (ref 30.0–36.0)
MCV: 112.7 fL — ABNORMAL HIGH (ref 80.0–100.0)
Monocytes Absolute: 1 10*3/uL (ref 0.1–1.0)
Monocytes Relative: 15 %
Neutro Abs: 3.8 10*3/uL (ref 1.7–7.7)
Neutrophils Relative %: 58 %
Platelet Count: 160 10*3/uL (ref 150–400)
RBC: 2.44 MIL/uL — ABNORMAL LOW (ref 3.87–5.11)
RDW: 15.1 % (ref 11.5–15.5)
WBC Count: 6.7 10*3/uL (ref 4.0–10.5)
nRBC: 0 % (ref 0.0–0.2)

## 2022-01-29 LAB — CMP (CANCER CENTER ONLY)
ALT: 6 U/L (ref 0–44)
AST: 11 U/L — ABNORMAL LOW (ref 15–41)
Albumin: 4 g/dL (ref 3.5–5.0)
Alkaline Phosphatase: 90 U/L (ref 38–126)
Anion gap: 7 (ref 5–15)
BUN: 22 mg/dL (ref 8–23)
CO2: 25 mmol/L (ref 22–32)
Calcium: 9.1 mg/dL (ref 8.9–10.3)
Chloride: 103 mmol/L (ref 98–111)
Creatinine: 1.28 mg/dL — ABNORMAL HIGH (ref 0.44–1.00)
GFR, Estimated: 47 mL/min — ABNORMAL LOW (ref >60)
Glucose, Bld: 112 mg/dL — ABNORMAL HIGH (ref 70–99)
Potassium: 4.6 mmol/L (ref 3.5–5.1)
Sodium: 135 mmol/L (ref 135–145)
Total Bilirubin: 0.4 mg/dL (ref 0.3–1.2)
Total Protein: 7.3 g/dL (ref 6.5–8.1)

## 2022-01-29 LAB — SAMPLE TO BLOOD BANK

## 2022-01-29 MED ORDER — ONDANSETRON HCL 4 MG/2ML IJ SOLN
8.0000 mg | Freq: Once | INTRAMUSCULAR | Status: AC
  Administered 2022-01-29: 8 mg via INTRAVENOUS
  Filled 2022-01-29: qty 4

## 2022-01-29 MED ORDER — DEXTROSE 5 % IV SOLN: Freq: Once | INTRAVENOUS | Status: AC

## 2022-01-29 MED ORDER — SODIUM CHLORIDE 0.9 % IV SOLN
2400.0000 mg/m2 | INTRAVENOUS | Status: DC
  Administered 2022-01-29: 4800 mg via INTRAVENOUS
  Filled 2022-01-29: qty 96

## 2022-01-29 MED ORDER — FLUOROURACIL CHEMO INJECTION 2.5 GM/50ML
400.0000 mg/m2 | Freq: Once | INTRAVENOUS | Status: AC
  Administered 2022-01-29: 800 mg via INTRAVENOUS
  Filled 2022-01-29: qty 16

## 2022-01-29 MED ORDER — LEUCOVORIN CALCIUM INJECTION 350 MG
400.0000 mg/m2 | Freq: Once | INTRAVENOUS | Status: AC
  Administered 2022-01-29: 804 mg via INTRAVENOUS
  Filled 2022-01-29: qty 40.2

## 2022-01-29 MED ORDER — SODIUM CHLORIDE 0.9% FLUSH
10.0000 mL | Freq: Once | INTRAVENOUS | Status: AC
  Administered 2022-01-29: 10 mL

## 2022-01-29 NOTE — Patient Instructions (Signed)
[facility]  Discharge Instructions: Thank you for choosing [facility] to provide your oncology and hematology care.   If you have a lab appointment with the [facility], please go directly to the [facility] and check in at the registration area.   Wear comfortable clothing and clothing appropriate for easy access to any Portacath or PICC line.   We strive to give you quality time with your provider. You may need to reschedule your appointment if you arrive late (15 or more minutes).  Arriving late affects you and other patients whose appointments are after yours.  Also, if you miss three or more appointments without notifying the office, you may be dismissed from the clinic at the providers discretion.      For prescription refill requests, have your pharmacy contact our office and allow 72 hours for refills to be completed.    Today you received the following chemotherapy and/or immunotherapy agents: Leucovorin/ 5FU      To help prevent nausea and vomiting after your treatment, we encourage you to take your nausea medication as directed.  BELOW ARE SYMPTOMS THAT SHOULD BE REPORTED IMMEDIATELY: *FEVER GREATER THAN 100.4 F (38 C) OR HIGHER *CHILLS OR SWEATING *NAUSEA AND VOMITING THAT IS NOT CONTROLLED WITH YOUR NAUSEA MEDICATION *UNUSUAL SHORTNESS OF BREATH *UNUSUAL BRUISING OR BLEEDING *URINARY PROBLEMS (pain or burning when urinating, or frequent urination) *BOWEL PROBLEMS (unusual diarrhea, constipation, pain near the anus) TENDERNESS IN MOUTH AND THROAT WITH OR WITHOUT PRESENCE OF ULCERS (sore throat, sores in mouth, or a toothache) UNUSUAL RASH, SWELLING OR PAIN  UNUSUAL VAGINAL DISCHARGE OR ITCHING   Items with * indicate a potential emergency and should be followed up as soon as possible or go to the Emergency Department if any problems should occur.  Please show the CHEMOTHERAPY ALERT CARD or IMMUNOTHERAPY ALERT CARD at  check-in to the Emergency Department and triage nurse.  Should you have questions after your visit or need to cancel or reschedule your appointment, please contact [facility]  Dept: [phone]  and follow the prompts.  Office hours are 8:00 a.m. to 4:30 p.m. Monday - Friday. Please note that voicemails left after 4:00 p.m. may not be returned until the following business day.  We are closed weekends and major holidays. You have access to a nurse at all times for urgent questions. Please call the main number to the clinic Dept: [phone] and follow the prompts.   For any non-urgent questions, you may also contact your provider using MyChart. We now offer e-Visits for anyone 9 and older to request care online for non-urgent symptoms. For details visit [email or web address].   Also download the MyChart app! Go to the app store, search "MyChart", open the app, select [facility], and log in with your MyChart username and password.  Due to Covid, a mask is required upon entering the hospital/clinic. If you do not have a mask, one will be given to you upon arrival. For doctor visits, patients may have 1 support person aged 63 or older with them. For treatment visits, patients cannot have anyone with them due to current Covid guidelines and our immunocompromised population.

## 2022-01-30 ENCOUNTER — Encounter: Payer: Self-pay | Admitting: Medical-Surgical

## 2022-01-30 ENCOUNTER — Telehealth: Payer: Self-pay | Admitting: *Deleted

## 2022-01-30 NOTE — Telephone Encounter (Signed)
Pt called requesting jury duty letter beginning March 15 as pt will be receiving chemotherapy on that date. Letter was printed and put on Dr.Shadad desk for signature. Pt made aware.

## 2022-01-31 ENCOUNTER — Other Ambulatory Visit: Payer: Self-pay

## 2022-01-31 ENCOUNTER — Inpatient Hospital Stay: Payer: No Typology Code available for payment source

## 2022-01-31 VITALS — BP 128/77 | HR 71 | Temp 97.5°F | Resp 16

## 2022-01-31 DIAGNOSIS — Z5111 Encounter for antineoplastic chemotherapy: Secondary | ICD-10-CM | POA: Diagnosis not present

## 2022-01-31 DIAGNOSIS — C669 Malignant neoplasm of unspecified ureter: Secondary | ICD-10-CM

## 2022-01-31 DIAGNOSIS — C801 Malignant (primary) neoplasm, unspecified: Secondary | ICD-10-CM

## 2022-01-31 MED ORDER — HEPARIN SOD (PORK) LOCK FLUSH 100 UNIT/ML IV SOLN
500.0000 [IU] | Freq: Once | INTRAVENOUS | Status: AC | PRN
  Administered 2022-01-31: 500 [IU]

## 2022-01-31 MED ORDER — SODIUM CHLORIDE 0.9% FLUSH
10.0000 mL | INTRAVENOUS | Status: DC | PRN
  Administered 2022-01-31: 10 mL

## 2022-02-03 ENCOUNTER — Telehealth: Payer: Self-pay | Admitting: *Deleted

## 2022-02-03 ENCOUNTER — Ambulatory Visit
Admission: RE | Admit: 2022-02-03 | Discharge: 2022-02-03 | Disposition: A | Discharge status: Home / Self Care | Payer: No Typology Code available for payment source | Source: Ambulatory Visit | Attending: Radiation Oncology | Admitting: Radiation Oncology

## 2022-02-03 DIAGNOSIS — C772 Secondary and unspecified malignant neoplasm of intra-abdominal lymph nodes: Secondary | ICD-10-CM | POA: Insufficient documentation

## 2022-02-03 DIAGNOSIS — C669 Malignant neoplasm of unspecified ureter: Secondary | ICD-10-CM | POA: Insufficient documentation

## 2022-02-03 NOTE — Progress Notes (Signed)
°  Radiation Oncology         [phone] ________________________________  Name: JOANNY DUPREE MRN: [id number]  Date: 02/03/2022  DOB: 10/07/1956  STEREOTACTIC BODY RADIOTHERAPY SIMULATION AND TREATMENT PLANNING NOTE    ICD-10-CM   1. 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  NARRATIVE:  The patient was brought to the [facility].  Identity was confirmed.  All relevant records and images related to the planned course of therapy were reviewed.  The patient freely provided informed written consent to proceed with treatment after reviewing the details related to the planned course of therapy. The consent form was witnessed and verified by the simulation staff.  Then, the patient was set-up in a stable reproducible  supine position for radiation therapy.  A BodyFix immobilization pillow was fabricated for reproducible positioning.  Surface markings were placed.  The CT images were loaded into the planning software.  The gross target volumes (GTV) and planning target volumes (PTV) were delinieated, and avoidance structures were contoured.  Treatment planning then occurred.  The radiation prescription was entered and confirmed.  A total of two complex treatment devices were fabricated in the form of the BodyFix immobilization pillow and a neck accuform cushion.  I have requested : 3D Simulation  I have requested a DVH of the following structures: targets and all normal structures near the target including kidneys, bowel and skin as noted on the radiation plan to maintain doses in adherence with established limits  SPECIAL TREATMENT PROCEDURE:  The planned course of therapy using radiation constitutes a special  treatment procedure. Special care is required in the management of this patient for the following reasons. High dose per fraction requiring special monitoring for increased toxicities of treatment including daily imaging..  The special nature of the planned course of radiotherapy will require increased physician supervision and oversight to ensure patient's safety with optimal treatment outcomes.    This requires extended time and effort.    PLAN:  The patient will receive 50 Gy in 5 fractions.  ________________________________  Sheral Apley Tammi Klippel, M.D.

## 2022-02-03 NOTE — Telephone Encounter (Signed)
PC to patient, informed her of Dr. Hazeline Junker message below, appointments on 02/12/22 canceled.  Patient verbalizes understanding.

## 2022-02-03 NOTE — Telephone Encounter (Signed)
-----   Message from Wyatt Portela, MD sent at 02/03/2022  4:09 PM EST ----- Just cancel 3/2 appointments. She is already scheduled 2 weeks later. Thanks ----- Message ----- From: Rolene Course, RN Sent: 02/03/2022   3:55 PM EST To: Wyatt Portela, MD  Ms Lacour called to say she has been terminated from her job & that she is in the process of getting [employer] insurance, she should have it by the first of March.  She has appointments scheduled for 02/12/22 and she thinks they should be moved out by a week or so due to the insurance change. Is it ok to reschedule this for her?  Please advise.  Thanks, Bethena Roys

## 2022-02-09 IMAGING — DX DG CHEST 2V
2 series · 2 of 2 positions shown · non-contrast
Comparison: July 20, 2021.

CLINICAL DATA: History of cancer in a 69-year-old female with
productive cough and headache for 1 month.

EXAM:
CHEST - 2 VIEW

[chest pa]
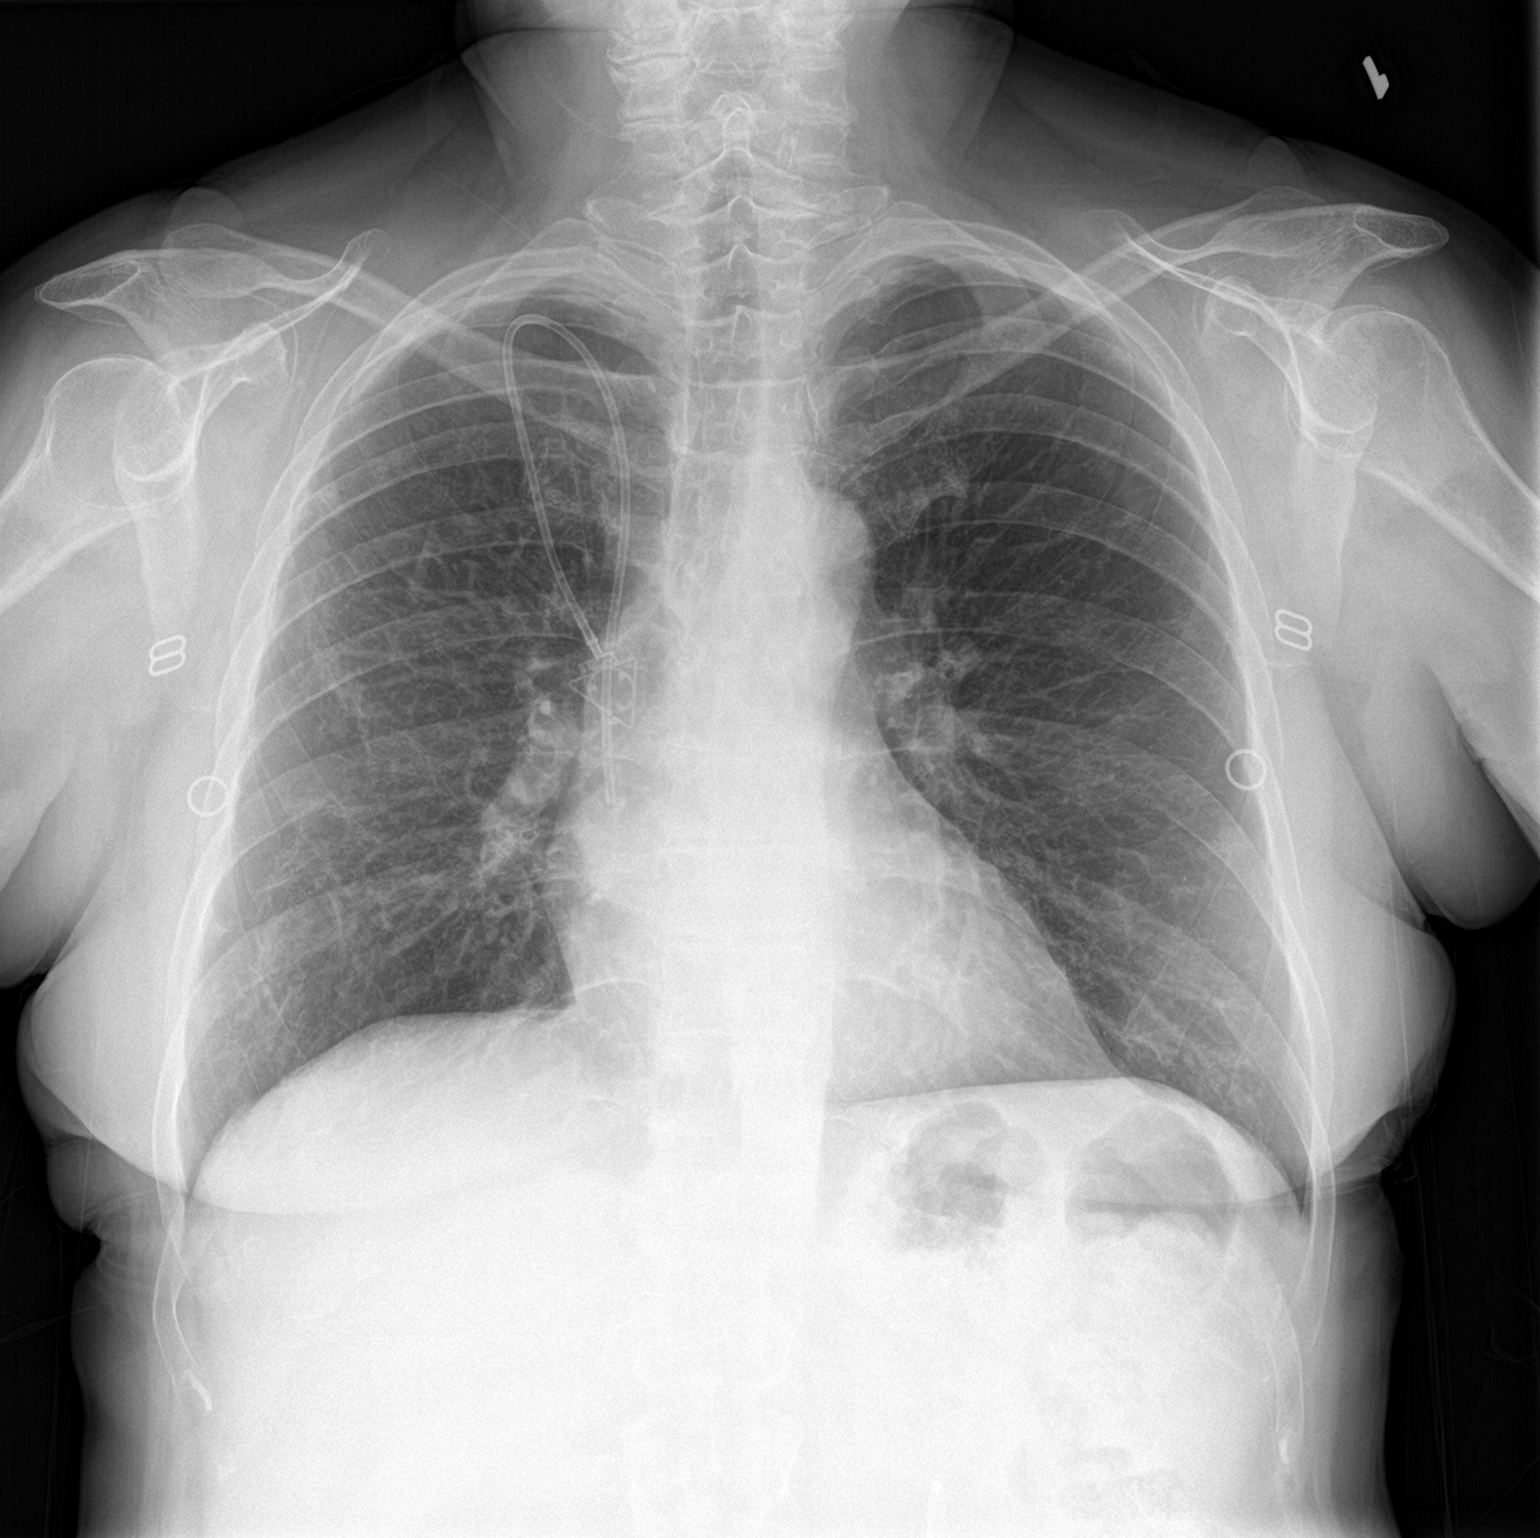

[chest lat]
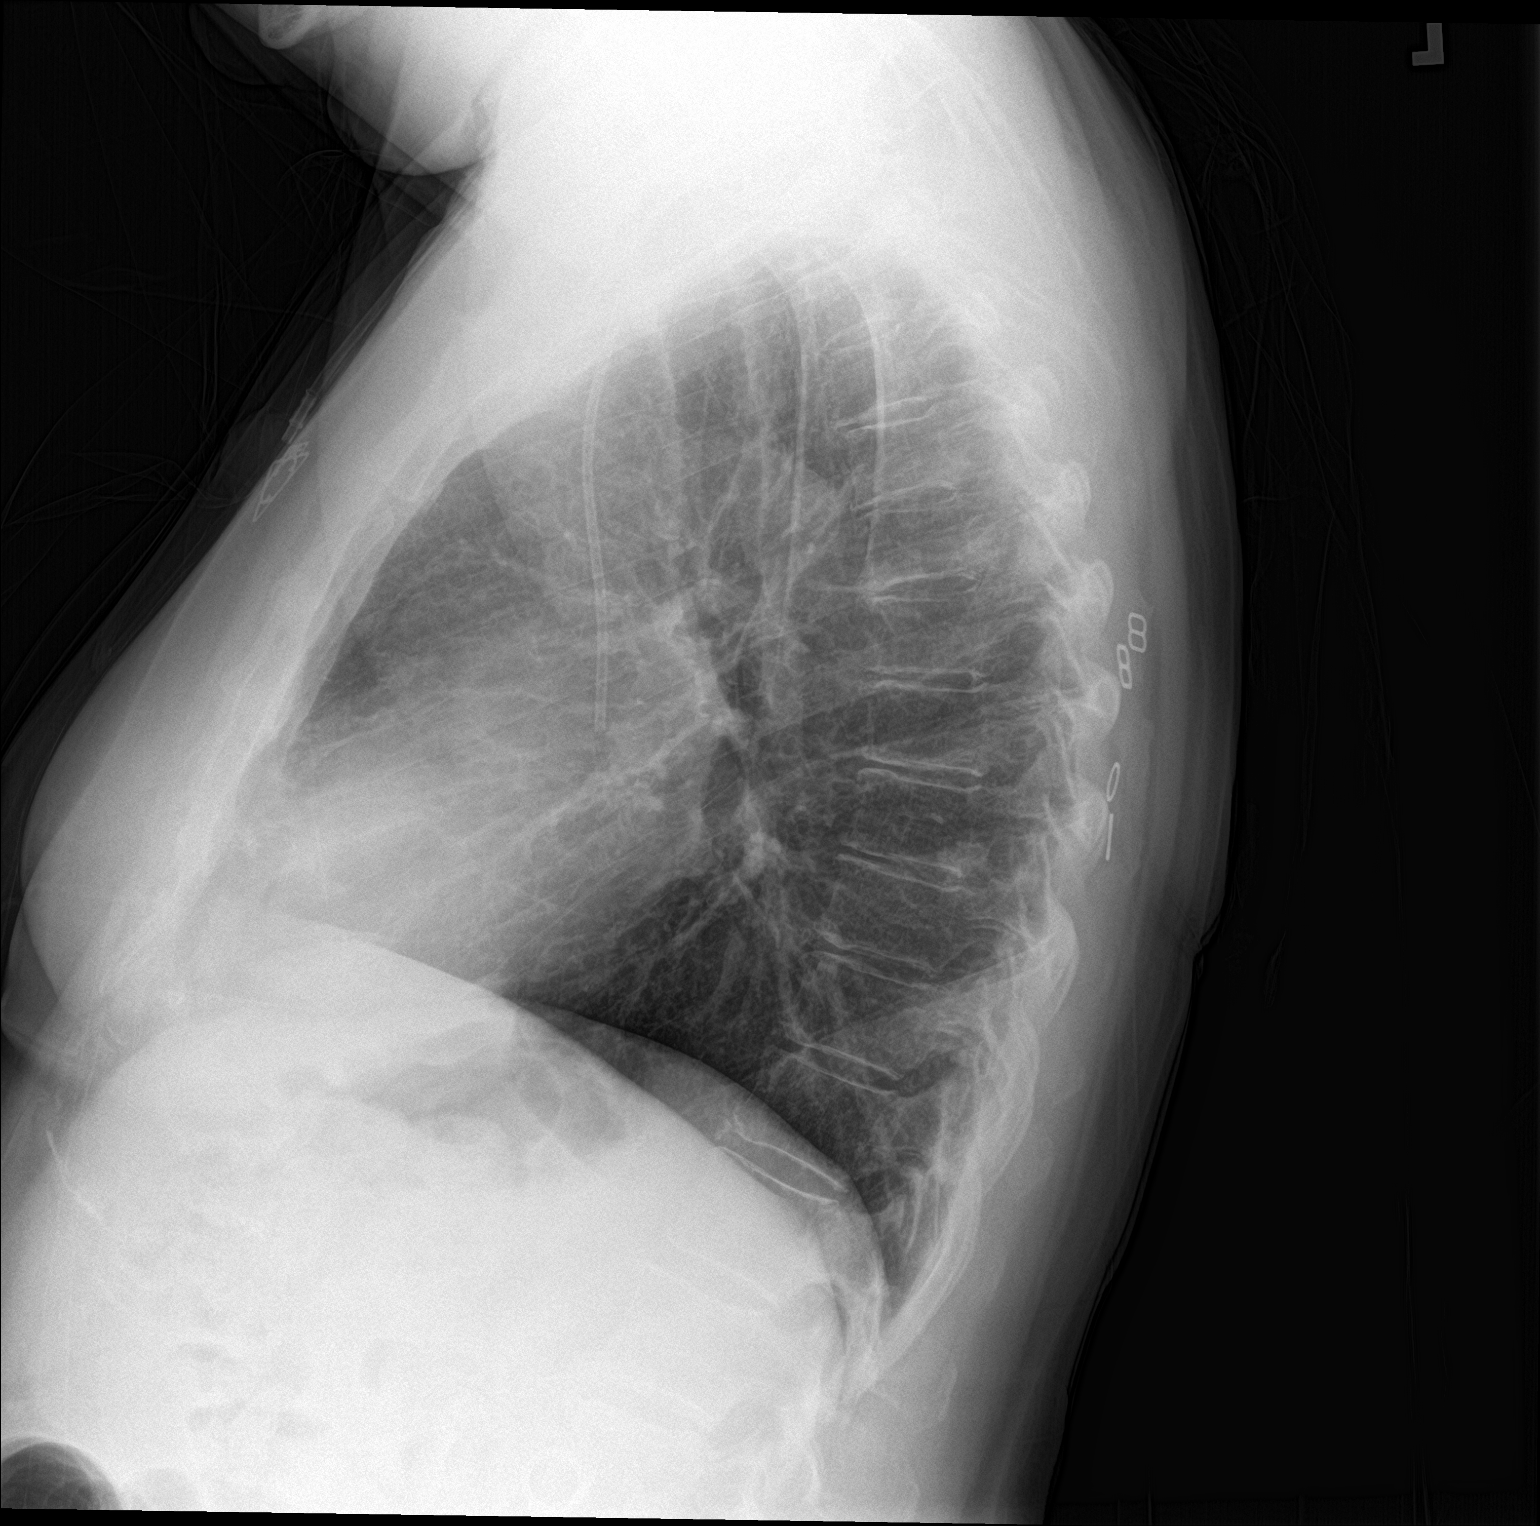

[2 of 2 positions shown; findings below may reference images not displayed]

FINDINGS: RIGHT-sided Port-A-Cath remains in place, tip at the caval to atrial
junction.

Cardiomediastinal contours and hilar structures are stable.

Lungs are clear aside from mild interstitial prominence with
subpleural pattern and RIGHT chest predominance that was seen on
previous CT imaging and with decreased conspicuity since July 20, 2021. Subtle changes also noted at the LEFT lung base are slightly
improved. There is no evidence of pneumothorax or sign of pleural
effusion.

Small nodular density projects over the lateral LEFT eighth rib on
the frontal radiograph.

On limited assessment there is no acute skeletal process.
IMPRESSION: Interstitial changes in the chest with similar distribution when
compared to prior imaging and with slight improvement since the
study July 20, 2021.

Question confluence of shadows versus small nodule at the LEFT lung
base measuring approximately 7 mm. In the context of potential
recent infection or inflammation in the chest would suggest short
interval follow-up for comparison to ensure resolution.

## 2022-02-10 ENCOUNTER — Other Ambulatory Visit: Payer: Self-pay | Admitting: Urology

## 2022-02-10 ENCOUNTER — Other Ambulatory Visit (HOSPITAL_COMMUNITY): Payer: Self-pay

## 2022-02-12 ENCOUNTER — Ambulatory Visit: Payer: No Typology Code available for payment source

## 2022-02-12 ENCOUNTER — Ambulatory Visit: Payer: No Typology Code available for payment source | Admitting: Oncology

## 2022-02-12 ENCOUNTER — Other Ambulatory Visit: Payer: No Typology Code available for payment source

## 2022-02-12 DIAGNOSIS — C669 Malignant neoplasm of unspecified ureter: Secondary | ICD-10-CM | POA: Insufficient documentation

## 2022-02-12 DIAGNOSIS — C772 Secondary and unspecified malignant neoplasm of intra-abdominal lymph nodes: Secondary | ICD-10-CM | POA: Insufficient documentation

## 2022-02-17 ENCOUNTER — Ambulatory Visit
Admission: RE | Admit: 2022-02-17 | Discharge: 2022-02-17 | Disposition: A | Discharge status: Home / Self Care | Payer: No Typology Code available for payment source | Source: Ambulatory Visit | Attending: Radiation Oncology | Admitting: Radiation Oncology

## 2022-02-17 ENCOUNTER — Other Ambulatory Visit: Payer: Self-pay

## 2022-02-17 DIAGNOSIS — C772 Secondary and unspecified malignant neoplasm of intra-abdominal lymph nodes: Secondary | ICD-10-CM | POA: Diagnosis not present

## 2022-02-18 ENCOUNTER — Other Ambulatory Visit (HOSPITAL_COMMUNITY): Payer: Self-pay

## 2022-02-18 ENCOUNTER — Ambulatory Visit: Payer: No Typology Code available for payment source

## 2022-02-19 ENCOUNTER — Ambulatory Visit
Admission: RE | Admit: 2022-02-19 | Discharge: 2022-02-19 | Disposition: A | Discharge status: Home / Self Care | Payer: No Typology Code available for payment source | Source: Ambulatory Visit | Attending: Radiation Oncology | Admitting: Radiation Oncology

## 2022-02-19 ENCOUNTER — Other Ambulatory Visit: Payer: Self-pay

## 2022-02-19 DIAGNOSIS — C779 Secondary and unspecified malignant neoplasm of lymph node, unspecified: Secondary | ICD-10-CM

## 2022-02-19 DIAGNOSIS — C772 Secondary and unspecified malignant neoplasm of intra-abdominal lymph nodes: Secondary | ICD-10-CM | POA: Diagnosis not present

## 2022-02-20 ENCOUNTER — Ambulatory Visit: Payer: No Typology Code available for payment source

## 2022-02-23 ENCOUNTER — Other Ambulatory Visit: Payer: Self-pay | Admitting: Oncology

## 2022-02-23 ENCOUNTER — Ambulatory Visit
Admission: RE | Admit: 2022-02-23 | Discharge: 2022-02-23 | Disposition: A | Discharge status: Home / Self Care | Payer: No Typology Code available for payment source | Source: Ambulatory Visit | Attending: Radiation Oncology | Admitting: Radiation Oncology

## 2022-02-23 ENCOUNTER — Telehealth: Payer: Self-pay

## 2022-02-23 ENCOUNTER — Other Ambulatory Visit: Payer: Self-pay

## 2022-02-23 DIAGNOSIS — C772 Secondary and unspecified malignant neoplasm of intra-abdominal lymph nodes: Secondary | ICD-10-CM | POA: Diagnosis not present

## 2022-02-23 MED ORDER — HYDROCODONE-ACETAMINOPHEN 5-325 MG PO TABS: 1.0000 | ORAL_TABLET | Freq: Four times a day (QID) | ORAL | 0 refills | Status: DC | PRN

## 2022-02-23 NOTE — Telephone Encounter (Signed)
Called pt to advise that Hydrocodone prescription sent to [facility] in [place]. Dr. Alen Blew will address concerns at apt on 3/16. ?

## 2022-02-23 NOTE — Telephone Encounter (Signed)
Pt called stating she has had left groin pain for three days described as "crampy" rated at 8/10 unrelieved by Ultram and Tylenol. She described this as same pain she felt in right groin when she was diagnosed with kidney cancer. She is asking who she should contact to follow-up on this pain. ? ?Routed to Provider. ?

## 2022-02-24 ENCOUNTER — Encounter: Payer: Self-pay | Admitting: Oncology

## 2022-02-24 ENCOUNTER — Other Ambulatory Visit: Payer: Self-pay | Admitting: Family Medicine

## 2022-02-24 ENCOUNTER — Other Ambulatory Visit (HOSPITAL_COMMUNITY): Payer: Self-pay

## 2022-02-24 ENCOUNTER — Other Ambulatory Visit: Payer: Self-pay | Admitting: Sports Medicine

## 2022-02-24 DIAGNOSIS — M170 Bilateral primary osteoarthritis of knee: Secondary | ICD-10-CM

## 2022-02-24 MED ORDER — GABAPENTIN 300 MG PO CAPS
300.0000 mg | ORAL_CAPSULE | Freq: Four times a day (QID) | ORAL | 1 refills | Status: DC
Start: 2022-02-24 — End: 2022-05-07
  Filled 2022-02-24: qty 360, 90d supply, fill #0

## 2022-02-24 NOTE — Telephone Encounter (Signed)
Forwarding to PCP.

## 2022-02-25 ENCOUNTER — Other Ambulatory Visit (HOSPITAL_COMMUNITY): Payer: Self-pay

## 2022-02-25 ENCOUNTER — Telehealth: Payer: Self-pay | Admitting: Oncology

## 2022-02-25 ENCOUNTER — Ambulatory Visit
Admission: RE | Admit: 2022-02-25 | Discharge: 2022-02-25 | Disposition: A | Discharge status: Home / Self Care | Payer: No Typology Code available for payment source | Source: Ambulatory Visit | Attending: Radiation Oncology | Admitting: Radiation Oncology

## 2022-02-25 ENCOUNTER — Other Ambulatory Visit: Payer: Self-pay

## 2022-02-25 DIAGNOSIS — C772 Secondary and unspecified malignant neoplasm of intra-abdominal lymph nodes: Secondary | ICD-10-CM | POA: Diagnosis not present

## 2022-02-25 MED ORDER — TRAMADOL HCL 50 MG PO TABS
50.0000 mg | ORAL_TABLET | Freq: Three times a day (TID) | ORAL | 3 refills | Status: DC | PRN
  Filled 2022-02-25: qty 30, 10d supply, fill #0
  Filled 2022-04-28: qty 30, 10d supply, fill #1

## 2022-02-25 NOTE — Telephone Encounter (Signed)
Called patient regarding 03/16 appointment, patient is notified. ?

## 2022-02-26 ENCOUNTER — Inpatient Hospital Stay: Payer: No Typology Code available for payment source

## 2022-02-26 ENCOUNTER — Other Ambulatory Visit: Payer: Self-pay

## 2022-02-26 ENCOUNTER — Inpatient Hospital Stay: Payer: No Typology Code available for payment source | Attending: Oncology

## 2022-02-26 ENCOUNTER — Inpatient Hospital Stay (HOSPITAL_BASED_OUTPATIENT_CLINIC_OR_DEPARTMENT_OTHER): Payer: No Typology Code available for payment source | Admitting: Oncology

## 2022-02-26 ENCOUNTER — Other Ambulatory Visit (HOSPITAL_COMMUNITY): Payer: Self-pay

## 2022-02-26 VITALS — BP 153/82 | HR 70 | Temp 97.7°F | Resp 17 | Ht 66.0 in | Wt 185.1 lb

## 2022-02-26 DIAGNOSIS — Z5111 Encounter for antineoplastic chemotherapy: Secondary | ICD-10-CM | POA: Diagnosis not present

## 2022-02-26 DIAGNOSIS — D630 Anemia in neoplastic disease: Secondary | ICD-10-CM | POA: Insufficient documentation

## 2022-02-26 DIAGNOSIS — C772 Secondary and unspecified malignant neoplasm of intra-abdominal lymph nodes: Secondary | ICD-10-CM | POA: Insufficient documentation

## 2022-02-26 DIAGNOSIS — C669 Malignant neoplasm of unspecified ureter: Secondary | ICD-10-CM | POA: Diagnosis not present

## 2022-02-26 DIAGNOSIS — Z79899 Other long term (current) drug therapy: Secondary | ICD-10-CM | POA: Insufficient documentation

## 2022-02-26 LAB — CMP (CANCER CENTER ONLY)
ALT: 8 U/L (ref 0–44)
AST: 13 U/L — ABNORMAL LOW (ref 15–41)
Albumin: 4 g/dL (ref 3.5–5.0)
Alkaline Phosphatase: 94 U/L (ref 38–126)
Anion gap: 8 (ref 5–15)
BUN: 27 mg/dL — ABNORMAL HIGH (ref 8–23)
CO2: 25 mmol/L (ref 22–32)
Calcium: 9 mg/dL (ref 8.9–10.3)
Chloride: 104 mmol/L (ref 98–111)
Creatinine: 1.19 mg/dL — ABNORMAL HIGH (ref 0.44–1.00)
GFR, Estimated: 51 mL/min — ABNORMAL LOW (ref >60)
Glucose, Bld: 89 mg/dL (ref 70–99)
Potassium: 4.6 mmol/L (ref 3.5–5.1)
Sodium: 137 mmol/L (ref 135–145)
Total Bilirubin: 0.4 mg/dL (ref 0.3–1.2)
Total Protein: 7.4 g/dL (ref 6.5–8.1)

## 2022-02-26 LAB — CBC WITH DIFFERENTIAL (CANCER CENTER ONLY)
Abs Immature Granulocytes: 0.04 10*3/uL (ref 0.00–0.07)
Basophils Absolute: 0.1 10*3/uL (ref 0.0–0.1)
Basophils Relative: 1 %
Eosinophils Absolute: 0.3 10*3/uL (ref 0.0–0.5)
Eosinophils Relative: 4 %
HCT: 30.2 % — ABNORMAL LOW (ref 36.0–46.0)
Hemoglobin: 9.7 g/dL — ABNORMAL LOW (ref 12.0–15.0)
Immature Granulocytes: 1 %
Lymphocytes Relative: 23 %
Lymphs Abs: 1.7 10*3/uL (ref 0.7–4.0)
MCH: 36.3 pg — ABNORMAL HIGH (ref 26.0–34.0)
MCHC: 32.1 g/dL (ref 30.0–36.0)
MCV: 113.1 fL — ABNORMAL HIGH (ref 80.0–100.0)
Monocytes Absolute: 1 10*3/uL (ref 0.1–1.0)
Monocytes Relative: 14 %
Neutro Abs: 4.2 10*3/uL (ref 1.7–7.7)
Neutrophils Relative %: 57 %
Platelet Count: 170 10*3/uL (ref 150–400)
RBC: 2.67 MIL/uL — ABNORMAL LOW (ref 3.87–5.11)
RDW: 14.6 % (ref 11.5–15.5)
WBC Count: 7.4 10*3/uL (ref 4.0–10.5)
nRBC: 0 % (ref 0.0–0.2)

## 2022-02-26 LAB — SAMPLE TO BLOOD BANK

## 2022-02-26 MED ORDER — ONDANSETRON HCL 4 MG/2ML IJ SOLN
8.0000 mg | Freq: Once | INTRAMUSCULAR | Status: AC
  Administered 2022-02-26: 8 mg via INTRAVENOUS
  Filled 2022-02-26: qty 4

## 2022-02-26 MED ORDER — FLUOROURACIL CHEMO INJECTION 2.5 GM/50ML
400.0000 mg/m2 | Freq: Once | INTRAVENOUS | Status: AC
  Administered 2022-02-26: 800 mg via INTRAVENOUS
  Filled 2022-02-26: qty 16

## 2022-02-26 MED ORDER — LEUCOVORIN CALCIUM INJECTION 350 MG
400.0000 mg/m2 | Freq: Once | INTRAVENOUS | Status: AC
  Administered 2022-02-26: 804 mg via INTRAVENOUS
  Filled 2022-02-26: qty 40.2

## 2022-02-26 MED ORDER — DEXTROSE 5 % IV SOLN: Freq: Once | INTRAVENOUS | Status: AC

## 2022-02-26 MED ORDER — SODIUM CHLORIDE 0.9 % IV SOLN
5000.0000 mg | INTRAVENOUS | Status: DC
  Administered 2022-02-26: 5000 mg via INTRAVENOUS
  Filled 2022-02-26: qty 100

## 2022-02-26 MED ORDER — SODIUM CHLORIDE 0.9% FLUSH
10.0000 mL | Freq: Once | INTRAVENOUS | Status: AC
  Administered 2022-02-26: 10 mL

## 2022-02-26 NOTE — Progress Notes (Signed)
Denies anyHematology and Oncology Follow Up Visit ? ?Mary Stark ?[id number] ?[birth year] 66 y.o. ?02/26/2022 10:09 AM ?Mary Fiscal, NP  ? ?[name]: 65 year old woman with adenocarcinoma of the bladder diagnosed in November 2021.  She was found to have stage IV disease with abdominal and pelvic adenopathy at that time. ? ? ?Prior Therapy: ? ?She is status post retroperitoneal lymph node biopsy completed on 10/24/2020 which showed metastatic carcinoma. ? ?She is status post repeat cystoscopy and bilateral ureteral stent exchange as well as resection of a bladder tumor on January 21, 2021.  A final pathology showed an adenocarcinoma. ? ?Chemotherapy utilizing carboplatin and gemcitabine started on November 22, 2020.  She completed 6 cycles of therapy on March 06, 2021. ? ?FOLFOX chemotherapy started on Apr 15, 2021.  She completed 12 cycles of therapy. ? ? ? ?Current therapy: 5-FU and leucovorin chemotherapy every 2 weeks started in November 2022.  She she is here for repeat evaluation. ? ?She is receiving radiation therapy to a right psoas metastatic lesion. She will complete 50 Gray in 5 fractions on February 27, 2022. ? ? ? ?Interim History: Mary Stark is here for a follow-up evaluation.  Since last visit, she completed radiation therapy to the right psoas muscle without any major complications.  She has reported pain in her left groin which has intermittent and manageable at this time.  It is not interfering with her ability to function and perform activities of daily living.  She uses tramadol with controlling her pain. ? ? ? ? ?Medications: Reviewed without changes. ?Current Outpatient Medications  ?Medication Sig Dispense Refill  ? acetaminophen (TYLENOL) 500 MG tablet Take 625 mg by mouth every 8 (eight) hours as needed for moderate pain.    ? albuterol (PROAIR HFA) 108 (90 Base) MCG/ACT inhaler INHALE 1 TO 2 PUFFS BY MOUTH INTO THE LUNGS EVERY 4 HOURS AS NEEDED FOR WHEEZING OR  SHORTNESS OF BREATH 8 g 1  ? amLODipine (NORVASC) 5 MG tablet Take 5 mg by mouth as needed.    ? ARIPiprazole (ABILIFY) 10 MG tablet TAKE 1 TABLET BY MOUTH ONCE DAILY 90 tablet 0  ? ascorbic acid (VITAMIN C) 500 MG tablet Take 1,000 mg by mouth every evening.     ? azelastine (ASTELIN) 0.1 % nasal spray Place 2 sprays into both nostrils 2 (two) times daily. Use in each nostril as directed 301 mL 1  ? benzonatate (TESSALON) 200 MG capsule Take 1 capsule (200 mg total) by mouth 3 (three) times daily as needed for cough. 30 capsule 0  ? buPROPion (WELLBUTRIN XL) 300 MG 24 hr tablet TAKE 1 TABLET BY MOUTH EVERY DAY 30 tablet 3  ? cephALEXin (KEFLEX) 250 MG capsule Take 1 capsule by mouth nightly as directed 90 capsule 1  ? Cholecalciferol (VITAMIN D) 125 MCG (5000 UT) CAPS Take 5,000 Units by mouth every evening.     ? clonazePAM (KLONOPIN) 1 MG tablet Take 1 tablet (1 mg total) by mouth at bedtime. 30 tablet 2  ? Coenzyme Q10 (CO Q-10) 200 MG CAPS Take 200 mg by mouth every evening.     ? cycloSPORINE (RESTASIS) 0.05 % ophthalmic emulsion Place 1 drop both eyes twice a day 180 each 3  ? escitalopram (LEXAPRO) 5 MG tablet Take 1 tablet by mouth daily. 30 tablet 3  ? fluticasone-salmeterol (ADVAIR HFA) 115-21 MCG/ACT inhaler Inhale 2 puffs into the lungs 2 (two) times daily. (Patient taking differently: Inhale 2 puffs into the lungs  2 (two) times daily. Havent had it filled) 1 each 0  ? gabapentin (NEURONTIN) 300 MG capsule Take 1 capsule (300 mg total) by mouth 4 (four) times daily. 360 capsule 1  ? GLUCOSAMINE-CHONDROITIN PO Take 2 tablets by mouth every evening.     ? lidocaine-prilocaine (EMLA) cream Apply 1 application topically as needed. 30 g 0  ? Meth-Hyo-M Bl-Na Phos-Ph Sal (URIBEL) 118 MG CAPS Take one capsule by mouth 3 times daily (Patient taking differently: as needed.) 30 capsule 3  ? mirabegron ER (MYRBETRIQ) 25 MG TB24 tablet TAKE 1 TABLET BY MOUTH ONCE A DAY 30 tablet 3  ? Nebivolol HCl 20 MG TABS Take  1/2 tablet (10 mg total) by mouth daily. 90 tablet 1  ? nitrofurantoin, macrocrystal-monohydrate, (MACROBID) 100 MG capsule Take 1 capsule (100 mg total) by mouth 2 (two) times daily. 10 capsule 0  ? Omega-3 Fatty Acids (FISH OIL) 1200 MG CAPS Take 1,200 mg by mouth every evening.     ? omeprazole (PRILOSEC) 20 MG capsule Take 1 capsule (20 mg total) by mouth daily. 90 capsule 3  ? ondansetron (ZOFRAN) 4 MG tablet Take 1 tablet (4 mg total) by mouth every 8 (eight) hours as needed for nausea or vomiting. 40 tablet 3  ? oxybutynin (DITROPAN) 5 MG tablet TAKE 1 TABLET BY MOUTH EVERY 8 HOURS AS NEEDED 90 tablet 3  ? oxymetazoline (AFRIN) 0.05 % nasal spray Place 1 spray into both nostrils 2 (two) times daily as needed for congestion. For nose bleeds    ? Phenazopyridine HCl (AZO URINARY PAIN PO) Take 2 tablets by mouth 3 (three) times daily as needed (bladder spasms).    ? simvastatin (ZOCOR) 20 MG tablet TAKE 1 TABLET (20 MG TOTAL) BY MOUTH DAILY. (Patient taking differently: Take 20 mg by mouth at bedtime.) 90 tablet 3  ? tamsulosin (FLOMAX) 0.4 MG CAPS capsule TAKE 1 CAPSULE BY MOUTH AT BEDTIME 30 capsule 3  ? thiamine 100 MG tablet Take 1 tablet (100 mg total) by mouth daily. 30 tablet 0  ? traMADol (ULTRAM) 50 MG tablet Take 1 tablet (50 mg total) by mouth 3 (three) times daily as needed. 30 tablet 3  ? vitamin B-12 (CYANOCOBALAMIN) 1000 MCG tablet Take 2,000 mcg by mouth every evening.     ? Vitamin E 180 MG (400 UNIT) CAPS Take 400 Units by mouth every evening.     ? ?No current facility-administered medications for this visit.  ? ? ? ?Allergies:  ?Allergies  ?Allergen Reactions  ? Metformin And Related Other (See Comments)  ?  Lactic acidosis   ? Penicillins Hives and Rash  ?  Reports hives to penicillin and amoxicillin as an adult "a long time ago." Has tolerated cefdinir (09/2020) and cefepime (10/2020) before.  ? Metformin   ?  [place] HYWV:[id number] ?[place] IOEV:[id number]  ? Penicillin G Sodium   ?  [place]  XHBZ:[id number] ?[place] FBPZ:[id number] ?[place] MPNT:[id number]  ? ? ? ? ?Physical Exam: ? ? ? ? ? ? ?Blood pressure (!) 153/82, pulse 70, temperature 97.7 ?F (36.5 ?C), temperature source Temporal, resp. rate 17, height '5\' 6"'$  (1.676 m), weight 185 lb 1.6 oz (84 kg), SpO2 98 %. ? ? ? ?ECOG: 1 ? ? ?General appearance: Alert, awake without any distress. ?Head: Atraumatic without abnormalities ?Oropharynx: Without any thrush or ulcers. ?Eyes: No scleral icterus. ?Lymph nodes: No lymphadenopathy noted in the cervical, supraclavicular, or axillary nodes ?Heart:regular rate and rhythm, without any murmurs or gallops.   ?Lung:  Clear to auscultation without any rhonchi, wheezes or dullness to percussion. ?Abdomin: Soft, nontender without any shifting dullness or ascites. ?Musculoskeletal: No clubbing or cyanosis. ?Neurological: No motor or sensory deficits. ?Skin: No rashes or lesions. ? ? ? ? ? ? ? ? ? ? ? ? ? ? ? ? ? ? ? ?Lab Results: ?Lab Results  ?Component Value Date  ? WBC 6.7 01/29/2022  ? HGB 9.1 (L) 01/29/2022  ? HCT 27.5 (L) 01/29/2022  ? MCV 112.7 (H) 01/29/2022  ? PLT 160 01/29/2022  ? ?  Chemistry   ?   ?Component Value Date/Time  ? NA 135 01/29/2022 0932  ? NA 142 04/26/2018 1333  ? K 4.6 01/29/2022 0932  ? CL 103 01/29/2022 0932  ? CO2 25 01/29/2022 0932  ? BUN 22 01/29/2022 0932  ? BUN 22 04/26/2018 1333  ? CREATININE 1.28 (H) 01/29/2022 0932  ? CREATININE 1.32 (H) 06/04/2021 0000  ? GLU 87 02/07/2013 0000  ?    ?Component Value Date/Time  ? CALCIUM 9.1 01/29/2022 0932  ? ALKPHOS 90 01/29/2022 0932  ? AST 11 (L) 01/29/2022 0932  ? ALT 6 01/29/2022 0932  ? BILITOT 0.4 01/29/2022 0932  ?  ? ? ? ? ? ?Impression and Plan: ? ? ? ?66 year old woman with: ?  ?1.   Stage IV adenocarcinoma of the bladder diagnosed in 2021 with pelvic adenopathy. ? ?She is currently receiving maintenance chemotherapy with 5-FU leucovorin after she developed oligo progression and received radiation therapy.  Risks and benefits of  proceeding with treatment at this time were discussed.  Different salvage therapy options including different systemic chemotherapy regimen were reviewed and will be deferred at this time unless he has progression of diseas

## 2022-02-27 ENCOUNTER — Other Ambulatory Visit (HOSPITAL_COMMUNITY): Payer: Self-pay

## 2022-02-27 ENCOUNTER — Encounter: Payer: Self-pay | Admitting: Urology

## 2022-02-27 ENCOUNTER — Ambulatory Visit
Admission: RE | Admit: 2022-02-27 | Discharge: 2022-02-27 | Disposition: A | Discharge status: Home / Self Care | Payer: No Typology Code available for payment source | Source: Ambulatory Visit | Attending: Radiation Oncology | Admitting: Radiation Oncology

## 2022-02-27 DIAGNOSIS — C779 Secondary and unspecified malignant neoplasm of lymph node, unspecified: Secondary | ICD-10-CM

## 2022-02-27 DIAGNOSIS — C772 Secondary and unspecified malignant neoplasm of intra-abdominal lymph nodes: Secondary | ICD-10-CM | POA: Diagnosis not present

## 2022-02-28 ENCOUNTER — Inpatient Hospital Stay: Payer: No Typology Code available for payment source

## 2022-02-28 ENCOUNTER — Other Ambulatory Visit: Payer: Self-pay

## 2022-02-28 VITALS — BP 121/82 | HR 97 | Temp 100.4°F | Resp 19

## 2022-02-28 DIAGNOSIS — C669 Malignant neoplasm of unspecified ureter: Secondary | ICD-10-CM

## 2022-02-28 DIAGNOSIS — C801 Malignant (primary) neoplasm, unspecified: Secondary | ICD-10-CM

## 2022-02-28 DIAGNOSIS — Z5111 Encounter for antineoplastic chemotherapy: Secondary | ICD-10-CM | POA: Diagnosis not present

## 2022-02-28 MED ORDER — SODIUM CHLORIDE 0.9% FLUSH
10.0000 mL | INTRAVENOUS | Status: DC | PRN
  Administered 2022-02-28: 10 mL

## 2022-02-28 MED ORDER — HEPARIN SOD (PORK) LOCK FLUSH 100 UNIT/ML IV SOLN
500.0000 [IU] | Freq: Once | INTRAVENOUS | Status: AC | PRN
  Administered 2022-02-28: 500 [IU]

## 2022-03-02 ENCOUNTER — Other Ambulatory Visit: Payer: Self-pay

## 2022-03-02 ENCOUNTER — Ambulatory Visit (INDEPENDENT_AMBULATORY_CARE_PROVIDER_SITE_OTHER): Payer: No Typology Code available for payment source

## 2022-03-02 ENCOUNTER — Ambulatory Visit (INDEPENDENT_AMBULATORY_CARE_PROVIDER_SITE_OTHER): Payer: No Typology Code available for payment source | Admitting: Sports Medicine

## 2022-03-02 DIAGNOSIS — M170 Bilateral primary osteoarthritis of knee: Secondary | ICD-10-CM

## 2022-03-02 DIAGNOSIS — M5416 Radiculopathy, lumbar region: Secondary | ICD-10-CM

## 2022-03-02 NOTE — Progress Notes (Signed)
? ? ?  Procedures performed today:   ? ?Procedure: Real-time Ultrasound Guided injection of the left knee ?Device: Samsung HS60  ?Verbal informed consent obtained.  ?Time-out conducted.  ?Noted no overlying erythema, induration, or other signs of local infection.  ?Skin prepped in a sterile fashion.  ?Local anesthesia: Topical Ethyl chloride.  ?With sterile technique and under real time ultrasound guidance: Trace effusion noted, 1 cc Kenalog 40, 2 cc lidocaine, 2 cc bupivacaine injected easily ?Completed without difficulty  ?Advised to call if fevers/chills, erythema, induration, drainage, or persistent bleeding.  ?Images permanently stored and available for review in PACS.  ?Impression: Technically successful ultrasound guided injection. ? ?Procedure: Real-time Ultrasound Guided injection of the right knee ?Device: Samsung HS60  ?Verbal informed consent obtained.  ?Time-out conducted.  ?Noted no overlying erythema, induration, or other signs of local infection.  ?Skin prepped in a sterile fashion.  ?Local anesthesia: Topical Ethyl chloride.  ?With sterile technique and under real time ultrasound guidance: Trace effusion noted, 1 cc Kenalog 40, 2 cc lidocaine, 2 cc bupivacaine injected easily ?Completed without difficulty  ?Advised to call if fevers/chills, erythema, induration, drainage, or persistent bleeding.  ?Images permanently stored and available for review in PACS.  ?Impression: Technically successful ultrasound guided injection. ? ?Independent interpretation of notes and tests performed by another provider:  ? ?None. ? ?Brief History, Exam, Impression, and Recommendations:   ? ?Primary osteoarthritis of both knees ?Pleasant 66 year old female, increasing bilateral knee pain, known knee osteoarthritis, last injections were in November 2022, repeat bilateral injections today, return to see me as needed. ? ?Right lumbar radiculopathy ?Increasing instability, weakness in the legs. ?Currently on chemotherapy. ?If  insufficient improvement in this we will do an additional work-up for peripheral neuropathies and central canal stenosis with labs, as well as nerve conduction/EMG. ?Stark goal is to get back hiking the trails with the years she has left. ? ? ? ?___________________________________________ ?Mary Stark. Dianah Field, M.D., ABFM., CAQSM. ?Primary Care and Sports Medicine ?[facility] ? ?Adjunct Instructor of Family Medicine  ?[employer] ?

## 2022-03-02 NOTE — Assessment & Plan Note (Addendum)
Increasing instability, weakness in the legs. ?Currently on chemotherapy. ?If insufficient improvement in this we will do an additional work-up for peripheral neuropathies and central canal stenosis with labs, as well as nerve conduction/EMG. ?Her goal is to get back hiking the trails with the years she has left. ?

## 2022-03-02 NOTE — Assessment & Plan Note (Signed)
Pleasant 66 year old female, increasing bilateral knee pain, known knee osteoarthritis, last injections were in November 2022, repeat bilateral injections today, return to see me as needed. ?

## 2022-03-03 ENCOUNTER — Telehealth: Payer: Self-pay | Admitting: Oncology

## 2022-03-03 NOTE — Telephone Encounter (Signed)
Scheduled per 03/17 los, patient has been called and notified.  

## 2022-03-11 ENCOUNTER — Other Ambulatory Visit: Payer: Self-pay

## 2022-03-11 ENCOUNTER — Inpatient Hospital Stay: Payer: No Typology Code available for payment source

## 2022-03-11 ENCOUNTER — Inpatient Hospital Stay (HOSPITAL_BASED_OUTPATIENT_CLINIC_OR_DEPARTMENT_OTHER): Payer: No Typology Code available for payment source | Admitting: Oncology

## 2022-03-11 VITALS — BP 139/82 | HR 81 | Temp 98.0°F | Resp 16 | Ht 66.0 in | Wt 177.5 lb

## 2022-03-11 DIAGNOSIS — C669 Malignant neoplasm of unspecified ureter: Secondary | ICD-10-CM

## 2022-03-11 DIAGNOSIS — C801 Malignant (primary) neoplasm, unspecified: Secondary | ICD-10-CM

## 2022-03-11 DIAGNOSIS — Z95828 Presence of other vascular implants and grafts: Secondary | ICD-10-CM

## 2022-03-11 DIAGNOSIS — Z5111 Encounter for antineoplastic chemotherapy: Secondary | ICD-10-CM | POA: Diagnosis not present

## 2022-03-11 LAB — CMP (CANCER CENTER ONLY)
ALT: 9 U/L (ref 0–44)
AST: 13 U/L — ABNORMAL LOW (ref 15–41)
Albumin: 4 g/dL (ref 3.5–5.0)
Alkaline Phosphatase: 83 U/L (ref 38–126)
Anion gap: 9 (ref 5–15)
BUN: 24 mg/dL — ABNORMAL HIGH (ref 8–23)
CO2: 24 mmol/L (ref 22–32)
Calcium: 9 mg/dL (ref 8.9–10.3)
Chloride: 102 mmol/L (ref 98–111)
Creatinine: 1.07 mg/dL — ABNORMAL HIGH (ref 0.44–1.00)
GFR, Estimated: 58 mL/min — ABNORMAL LOW (ref >60)
Glucose, Bld: 101 mg/dL — ABNORMAL HIGH (ref 70–99)
Potassium: 3.9 mmol/L (ref 3.5–5.1)
Sodium: 135 mmol/L (ref 135–145)
Total Bilirubin: 0.4 mg/dL (ref 0.3–1.2)
Total Protein: 7 g/dL (ref 6.5–8.1)

## 2022-03-11 LAB — CBC WITH DIFFERENTIAL (CANCER CENTER ONLY)
Abs Immature Granulocytes: 0.06 10*3/uL (ref 0.00–0.07)
Basophils Absolute: 0 10*3/uL (ref 0.0–0.1)
Basophils Relative: 1 %
Eosinophils Absolute: 0.3 10*3/uL (ref 0.0–0.5)
Eosinophils Relative: 4 %
HCT: 28.7 % — ABNORMAL LOW (ref 36.0–46.0)
Hemoglobin: 9.5 g/dL — ABNORMAL LOW (ref 12.0–15.0)
Immature Granulocytes: 1 %
Lymphocytes Relative: 23 %
Lymphs Abs: 1.4 10*3/uL (ref 0.7–4.0)
MCH: 35.8 pg — ABNORMAL HIGH (ref 26.0–34.0)
MCHC: 33.1 g/dL (ref 30.0–36.0)
MCV: 108.3 fL — ABNORMAL HIGH (ref 80.0–100.0)
Monocytes Absolute: 0.8 10*3/uL (ref 0.1–1.0)
Monocytes Relative: 13 %
Neutro Abs: 3.5 10*3/uL (ref 1.7–7.7)
Neutrophils Relative %: 58 %
Platelet Count: 113 10*3/uL — ABNORMAL LOW (ref 150–400)
RBC: 2.65 MIL/uL — ABNORMAL LOW (ref 3.87–5.11)
RDW: 14.4 % (ref 11.5–15.5)
WBC Count: 6 10*3/uL (ref 4.0–10.5)
nRBC: 0 % (ref 0.0–0.2)

## 2022-03-11 LAB — SAMPLE TO BLOOD BANK

## 2022-03-11 MED ORDER — SODIUM CHLORIDE 0.9% FLUSH: 10.0000 mL | INTRAVENOUS | Status: DC | PRN

## 2022-03-11 MED ORDER — SODIUM CHLORIDE 0.9% FLUSH
10.0000 mL | Freq: Once | INTRAVENOUS | Status: AC
  Administered 2022-03-11: 10 mL

## 2022-03-11 MED ORDER — HEPARIN SOD (PORK) LOCK FLUSH 100 UNIT/ML IV SOLN: 500.0000 [IU] | Freq: Once | INTRAVENOUS | Status: DC | PRN

## 2022-03-11 MED ORDER — ONDANSETRON HCL 4 MG/2ML IJ SOLN
8.0000 mg | Freq: Once | INTRAMUSCULAR | Status: AC
  Administered 2022-03-11: 8 mg via INTRAVENOUS
  Filled 2022-03-11: qty 4

## 2022-03-11 MED ORDER — LEUCOVORIN CALCIUM INJECTION 350 MG
400.0000 mg/m2 | Freq: Once | INTRAVENOUS | Status: AC
  Administered 2022-03-11: 804 mg via INTRAVENOUS
  Filled 2022-03-11: qty 40.2

## 2022-03-11 MED ORDER — SODIUM CHLORIDE 0.9 % IV SOLN
5000.0000 mg | INTRAVENOUS | Status: DC
  Administered 2022-03-11: 5000 mg via INTRAVENOUS
  Filled 2022-03-11: qty 100

## 2022-03-11 MED ORDER — DEXTROSE 5 % IV SOLN: Freq: Once | INTRAVENOUS | Status: AC

## 2022-03-11 MED ORDER — FLUOROURACIL CHEMO INJECTION 2.5 GM/50ML
400.0000 mg/m2 | Freq: Once | INTRAVENOUS | Status: AC
  Administered 2022-03-11: 800 mg via INTRAVENOUS
  Filled 2022-03-11: qty 16

## 2022-03-11 NOTE — Patient Instructions (Signed)
[facility]  Discharge Instructions: ?Thank you for choosing [facility] to provide your oncology and hematology care.  ? ?If you have a lab appointment with the [facility], please go directly to the [facility] and check in at the registration area. ?  ?Wear comfortable clothing and clothing appropriate for easy access to any Portacath or PICC line.  ? ?We strive to give you quality time with your provider. You may need to reschedule your appointment if you arrive late (15 or more minutes).  Arriving late affects you and other patients whose appointments are after yours.  Also, if you miss three or more appointments without notifying the office, you may be dismissed from the clinic at the provider?s discretion.    ?  ?For prescription refill requests, have your pharmacy contact our office and allow 72 hours for refills to be completed.   ? ?Today you received the following chemotherapy and/or immunotherapy agents: Leucovorin/ 5FU    ?  ?To help prevent nausea and vomiting after your treatment, we encourage you to take your nausea medication as directed. ? ?BELOW ARE SYMPTOMS THAT SHOULD BE REPORTED IMMEDIATELY: ?*FEVER GREATER THAN 100.4 F (38 ?C) OR HIGHER ?*CHILLS OR SWEATING ?*NAUSEA AND VOMITING THAT IS NOT CONTROLLED WITH YOUR NAUSEA MEDICATION ?*UNUSUAL SHORTNESS OF BREATH ?*UNUSUAL BRUISING OR BLEEDING ?*URINARY PROBLEMS (pain or burning when urinating, or frequent urination) ?*BOWEL PROBLEMS (unusual diarrhea, constipation, pain near the anus) ?TENDERNESS IN MOUTH AND THROAT WITH OR WITHOUT PRESENCE OF ULCERS (sore throat, sores in mouth, or a toothache) ?UNUSUAL RASH, SWELLING OR PAIN  ?UNUSUAL VAGINAL DISCHARGE OR ITCHING  ? ?Items with * indicate a potential emergency and should be followed up as soon as possible or go to the Emergency Department if any problems should occur. ? ?Please show the CHEMOTHERAPY ALERT CARD or IMMUNOTHERAPY ALERT CARD at  check-in to the Emergency Department and triage nurse. ? ?Should you have questions after your visit or need to cancel or reschedule your appointment, please contact [facility]  Dept: [phone]  and follow the prompts.  Office hours are 8:00 a.m. to 4:30 p.m. Monday - Friday. Please note that voicemails left after 4:00 p.m. may not be returned until the following business day.  We are closed weekends and major holidays. You have access to a nurse at all times for urgent questions. Please call the main number to the clinic Dept: [phone] and follow the prompts. ? ? ?For any non-urgent questions, you may also contact your provider using MyChart. We now offer e-Visits for anyone 2 and older to request care online for non-urgent symptoms. For details visit [email or web address]. ?  ?Also download the MyChart app! Go to the app store, search "MyChart", open the app, select [facility], and log in with your MyChart username and password. ? ?Due to Covid, a mask is required upon entering the hospital/clinic. If you do not have a mask, one will be given to you upon arrival. For doctor visits, patients may have 1 support person aged 42 or older with them. For treatment visits, patients cannot have anyone with them due to current Covid guidelines and our immunocompromised population.  ? ?

## 2022-03-11 NOTE — Progress Notes (Signed)
Denies anyHematology and Oncology Follow Up Visit ? ?Mary Stark ?[id number] ?[birth year] 66 y.o. ?03/11/2022 8:15 AM ?[name], NP  ? 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  ? ? ?Prior Therapy: ? ?She is status post retroperitoneal lymph node biopsy completed on 10/24/2020 which showed metastatic carcinoma. ? ?She is status post repeat cystoscopy and bilateral ureteral stent exchange as well as resection of a bladder tumor on January 21, 2021.  A final pathology showed an adenocarcinoma. ? ?Chemotherapy utilizing carboplatin and gemcitabine started on November 22, 2020.  She completed 6 cycles of therapy on March 06, 2021. ? ?FOLFOX chemotherapy started on Apr 15, 2021.  She completed 12 cycles of therapy. ? ? ? ?Current therapy: 5-FU and leucovorin chemotherapy every 2 weeks started in November 2022.  She returns today for a follow-up visit in the next treatment. ? ?She is receiving radiation therapy to a right psoas metastatic lesion. She will complete 50 Gray in 5 fractions on February 27, 2022. ? ? ? ?Interim History: [name] returns today for a follow-up visit.  Since the last visit, she reports no major changes in her health.  She continues to tolerate current therapy without any complaints.  She denies any nausea, vomiting or abdominal pain.  She denies any worsening fatigue or tiredness.  She denies any neuropathy or bleeding. ? ? ? ? ?Medications: Updated on review. ?Current Outpatient Medications  ?Medication Sig Dispense Refill  ? acetaminophen (TYLENOL) 500 MG tablet Take 625 mg by mouth every 8 (eight) hours as needed for moderate pain.    ? albuterol (PROAIR HFA) 108 (90 Base) MCG/ACT inhaler INHALE 1 TO 2 PUFFS BY MOUTH INTO THE LUNGS EVERY 4 HOURS AS NEEDED FOR WHEEZING OR SHORTNESS OF BREATH 8 g 1  ? amLODipine (NORVASC) 5 MG tablet Take 5 mg by mouth as needed.    ? ARIPiprazole  (ABILIFY) 10 MG tablet TAKE 1 TABLET BY MOUTH ONCE DAILY 90 tablet 0  ? ascorbic acid (VITAMIN C) 500 MG tablet Take 1,000 mg by mouth every evening.     ? azelastine (ASTELIN) 0.1 % nasal spray Place 2 sprays into both nostrils 2 (two) times daily. Use in each nostril as directed 301 mL 1  ? benzonatate (TESSALON) 200 MG capsule Take 1 capsule (200 mg total) by mouth 3 (three) times daily as needed for cough. 30 capsule 0  ? buPROPion (WELLBUTRIN XL) 300 MG 24 hr tablet TAKE 1 TABLET BY MOUTH EVERY DAY 30 tablet 3  ? cephALEXin (KEFLEX) 250 MG capsule Take 1 capsule by mouth nightly as directed 90 capsule 1  ? Cholecalciferol (VITAMIN D) 125 MCG (5000 UT) CAPS Take 5,000 Units by mouth every evening.     ? clonazePAM (KLONOPIN) 1 MG tablet Take 1 tablet (1 mg total) by mouth at bedtime. 30 tablet 2  ? Coenzyme Q10 (CO Q-10) 200 MG CAPS Take 200 mg by mouth every evening.     ? cycloSPORINE (RESTASIS) 0.05 % ophthalmic emulsion Place 1 drop both eyes twice a day 180 each 3  ? escitalopram (LEXAPRO) 5 MG tablet Take 1 tablet by mouth daily. 30 tablet 3  ? fluticasone-salmeterol (ADVAIR HFA) 115-21 MCG/ACT inhaler Inhale 2 puffs into the lungs 2 (two) times daily. (Patient taking differently: Inhale 2 puffs into the lungs 2 (two) times daily. Havent had it filled) 1 each 0  ? gabapentin (NEURONTIN) 300 MG capsule  Take 1 capsule (300 mg total) by mouth 4 (four) times daily. 360 capsule 1  ? GLUCOSAMINE-CHONDROITIN PO Take 2 tablets by mouth every evening.     ? lidocaine-prilocaine (EMLA) cream Apply 1 application topically as needed. 30 g 0  ? Meth-Hyo-M Bl-Na Phos-Ph Sal (URIBEL) 118 MG CAPS Take one capsule by mouth 3 times daily (Patient taking differently: as needed.) 30 capsule 3  ? mirabegron ER (MYRBETRIQ) 25 MG TB24 tablet TAKE 1 TABLET BY MOUTH ONCE A DAY 30 tablet 3  ? Nebivolol HCl 20 MG TABS Take 1/2 tablet (10 mg total) by mouth daily. 90 tablet 1  ? nitrofurantoin, macrocrystal-monohydrate, (MACROBID)  100 MG capsule Take 1 capsule (100 mg total) by mouth 2 (two) times daily. 10 capsule 0  ? Omega-3 Fatty Acids (FISH OIL) 1200 MG CAPS Take 1,200 mg by mouth every evening.     ? omeprazole (PRILOSEC) 20 MG capsule Take 1 capsule (20 mg total) by mouth daily. 90 capsule 3  ? ondansetron (ZOFRAN) 4 MG tablet Take 1 tablet (4 mg total) by mouth every 8 (eight) hours as needed for nausea or vomiting. 40 tablet 3  ? oxybutynin (DITROPAN) 5 MG tablet TAKE 1 TABLET BY MOUTH EVERY 8 HOURS AS NEEDED 90 tablet 3  ? oxymetazoline (AFRIN) 0.05 % nasal spray Place 1 spray into both nostrils 2 (two) times daily as needed for congestion. For nose bleeds    ? Phenazopyridine HCl (AZO URINARY PAIN PO) Take 2 tablets by mouth 3 (three) times daily as needed (bladder spasms).    ? simvastatin (ZOCOR) 20 MG tablet TAKE 1 TABLET (20 MG TOTAL) BY MOUTH DAILY. (Patient taking differently: Take 20 mg by mouth at bedtime.) 90 tablet 3  ? tamsulosin (FLOMAX) 0.4 MG CAPS capsule TAKE 1 CAPSULE BY MOUTH AT BEDTIME 30 capsule 3  ? thiamine 100 MG tablet Take 1 tablet (100 mg total) by mouth daily. 30 tablet 0  ? traMADol (ULTRAM) 50 MG tablet Take 1 tablet (50 mg total) by mouth 3 (three) times daily as needed. 30 tablet 3  ? vitamin B-12 (CYANOCOBALAMIN) 1000 MCG tablet Take 2,000 mcg by mouth every evening.     ? Vitamin E 180 MG (400 UNIT) CAPS Take 400 Units by mouth every evening.     ? ?No current facility-administered medications for this visit.  ? ? ? ?Allergies:  ?Allergies  ?Allergen Reactions  ? Metformin And Related Other (See Comments)  ?  Lactic acidosis   ? Penicillins Hives and Rash  ?  Reports hives to penicillin and amoxicillin as an adult "a long time ago." Has tolerated cefdinir (09/2020) and cefepime (10/2020) before.  ? Metformin   ?  [place] MVHQ:[id number] ?[place] LKGM:[id number]  ? Penicillin G Sodium   ?  [place] QIHK:[id number] ?[place] IEPP:[id number] ?[place] YTKZ:[id number]  ? ? ? ? ?Physical Exam: ? ? ? ? ? ?Blood pressure  139/82, pulse 81, temperature 98 ?F (36.7 ?C), temperature source Temporal, resp. rate 16, height '5\' 6"'$  (1.676 m), weight 177 lb 8 oz (80.5 kg), SpO2 97 %. ? ? ? ? ? ?ECOG: 1 ? ? ? ?General appearance: Comfortable appearing without any discomfort ?Head: Normocephalic without any trauma ?Oropharynx: Mucous membranes are moist and pink without any thrush or ulcers. ?Eyes: Pupils are equal and round reactive to light. ?Lymph nodes: No cervical, supraclavicular, inguinal or axillary lymphadenopathy.   ?Heart:regular rate and rhythm.  S1 and S2 without leg edema. ?Lung: Clear without any rhonchi or wheezes.  No dullness to percussion. ?Abdomin: Soft, nontender, nondistended with good bowel sounds.  No hepatosplenomegaly. ?Musculoskeletal: No joint deformity or effusion.  Full range of motion noted. ?Neurological: No deficits noted on motor, sensory and deep tendon reflex exam. ?Skin: No petechial rash or dryness.  Appeared moist.  ? ? ? ? ? ? ? ? ? ? ? ? ? ? ? ? ? ? ? ? ?Lab Results: ?Lab Results  ?Component Value Date  ? WBC 7.4 02/26/2022  ? HGB 9.7 (L) 02/26/2022  ? HCT 30.2 (L) 02/26/2022  ? MCV 113.1 (H) 02/26/2022  ? PLT 170 02/26/2022  ? ?  Chemistry   ?   ?Component Value Date/Time  ? NA 137 02/26/2022 0955  ? NA 142 04/26/2018 1333  ? K 4.6 02/26/2022 0955  ? CL 104 02/26/2022 0955  ? CO2 25 02/26/2022 0955  ? BUN 27 (H) 02/26/2022 0955  ? BUN 22 04/26/2018 1333  ? CREATININE 1.19 (H) 02/26/2022 0955  ? CREATININE 1.32 (H) 06/04/2021 0000  ? GLU 87 02/07/2013 0000  ?    ?Component Value Date/Time  ? CALCIUM 9.0 02/26/2022 0955  ? ALKPHOS 94 02/26/2022 0955  ? AST 13 (L) 02/26/2022 0955  ? ALT 8 02/26/2022 0955  ? BILITOT 0.4 02/26/2022 0955  ?  ? ? ? ? ? ?Impression and Plan: ? ? ? ?65 year old woman with: ?  ?1.   Bladder cancer diagnosed in 2021.  She developed stage IV adenocarcinoma with pelvic lymph node involvement. ? ?The natural course of her disease was reviewed today and treatment choices were  reiterated.  She continues to be on maintenance 5-FU with leucovorin.  Restarting treatment with FOLFOX could be considered if she developed relapsed disease.  Plan is to repeat imaging studies in May 2023. ? ? ? ?2.  I

## 2022-03-13 ENCOUNTER — Inpatient Hospital Stay: Payer: No Typology Code available for payment source

## 2022-03-13 ENCOUNTER — Other Ambulatory Visit: Payer: Self-pay

## 2022-03-13 DIAGNOSIS — C801 Malignant (primary) neoplasm, unspecified: Secondary | ICD-10-CM

## 2022-03-13 DIAGNOSIS — Z5111 Encounter for antineoplastic chemotherapy: Secondary | ICD-10-CM | POA: Diagnosis not present

## 2022-03-13 DIAGNOSIS — Z95828 Presence of other vascular implants and grafts: Secondary | ICD-10-CM

## 2022-03-13 MED ORDER — HEPARIN SOD (PORK) LOCK FLUSH 100 UNIT/ML IV SOLN
500.0000 [IU] | Freq: Once | INTRAVENOUS | Status: AC
  Administered 2022-03-13: 500 [IU]

## 2022-03-13 MED ORDER — SODIUM CHLORIDE 0.9% FLUSH
10.0000 mL | Freq: Once | INTRAVENOUS | Status: AC
  Administered 2022-03-13: 10 mL

## 2022-03-19 ENCOUNTER — Other Ambulatory Visit (HOSPITAL_COMMUNITY): Payer: Self-pay

## 2022-03-19 ENCOUNTER — Other Ambulatory Visit: Payer: Self-pay | Admitting: Physician Assistant

## 2022-03-19 MED ORDER — ARIPIPRAZOLE 10 MG PO TABS
ORAL_TABLET | Freq: Every day | ORAL | 0 refills | Status: DC
  Filled 2022-03-19: qty 90, 90d supply, fill #0

## 2022-03-26 ENCOUNTER — Inpatient Hospital Stay: Payer: No Typology Code available for payment source

## 2022-03-26 ENCOUNTER — Inpatient Hospital Stay: Payer: No Typology Code available for payment source | Attending: Oncology | Admitting: Oncology

## 2022-03-26 ENCOUNTER — Other Ambulatory Visit: Payer: Self-pay

## 2022-03-26 VITALS — BP 135/73 | HR 73 | Temp 97.2°F | Resp 20 | Wt 176.5 lb

## 2022-03-26 VITALS — BP 124/61

## 2022-03-26 DIAGNOSIS — M25552 Pain in left hip: Secondary | ICD-10-CM | POA: Diagnosis not present

## 2022-03-26 DIAGNOSIS — Z95828 Presence of other vascular implants and grafts: Secondary | ICD-10-CM

## 2022-03-26 DIAGNOSIS — Z79899 Other long term (current) drug therapy: Secondary | ICD-10-CM | POA: Diagnosis not present

## 2022-03-26 DIAGNOSIS — Z5111 Encounter for antineoplastic chemotherapy: Secondary | ICD-10-CM | POA: Diagnosis present

## 2022-03-26 DIAGNOSIS — Z452 Encounter for adjustment and management of vascular access device: Secondary | ICD-10-CM | POA: Diagnosis not present

## 2022-03-26 DIAGNOSIS — C801 Malignant (primary) neoplasm, unspecified: Secondary | ICD-10-CM

## 2022-03-26 DIAGNOSIS — C669 Malignant neoplasm of unspecified ureter: Secondary | ICD-10-CM

## 2022-03-26 DIAGNOSIS — C772 Secondary and unspecified malignant neoplasm of intra-abdominal lymph nodes: Secondary | ICD-10-CM | POA: Diagnosis not present

## 2022-03-26 DIAGNOSIS — D630 Anemia in neoplastic disease: Secondary | ICD-10-CM | POA: Insufficient documentation

## 2022-03-26 LAB — CMP (CANCER CENTER ONLY)
ALT: 9 U/L (ref 0–44)
AST: 11 U/L — ABNORMAL LOW (ref 15–41)
Albumin: 3.9 g/dL (ref 3.5–5.0)
Alkaline Phosphatase: 83 U/L (ref 38–126)
Anion gap: 8 (ref 5–15)
BUN: 24 mg/dL — ABNORMAL HIGH (ref 8–23)
CO2: 24 mmol/L (ref 22–32)
Calcium: 9.2 mg/dL (ref 8.9–10.3)
Chloride: 99 mmol/L (ref 98–111)
Creatinine: 1.24 mg/dL — ABNORMAL HIGH (ref 0.44–1.00)
GFR, Estimated: 49 mL/min — ABNORMAL LOW (ref >60)
Glucose, Bld: 101 mg/dL — ABNORMAL HIGH (ref 70–99)
Potassium: 4.5 mmol/L (ref 3.5–5.1)
Sodium: 131 mmol/L — ABNORMAL LOW (ref 135–145)
Total Bilirubin: 0.4 mg/dL (ref 0.3–1.2)
Total Protein: 7.2 g/dL (ref 6.5–8.1)

## 2022-03-26 LAB — CBC WITH DIFFERENTIAL (CANCER CENTER ONLY)
Abs Immature Granulocytes: 0.03 10*3/uL (ref 0.00–0.07)
Basophils Absolute: 0 10*3/uL (ref 0.0–0.1)
Basophils Relative: 0 %
Eosinophils Absolute: 0.1 10*3/uL (ref 0.0–0.5)
Eosinophils Relative: 2 %
HCT: 26.3 % — ABNORMAL LOW (ref 36.0–46.0)
Hemoglobin: 8.8 g/dL — ABNORMAL LOW (ref 12.0–15.0)
Immature Granulocytes: 1 %
Lymphocytes Relative: 19 %
Lymphs Abs: 1 10*3/uL (ref 0.7–4.0)
MCH: 35.5 pg — ABNORMAL HIGH (ref 26.0–34.0)
MCHC: 33.5 g/dL (ref 30.0–36.0)
MCV: 106 fL — ABNORMAL HIGH (ref 80.0–100.0)
Monocytes Absolute: 0.7 10*3/uL (ref 0.1–1.0)
Monocytes Relative: 13 %
Neutro Abs: 3.6 10*3/uL (ref 1.7–7.7)
Neutrophils Relative %: 65 %
Platelet Count: 178 10*3/uL (ref 150–400)
RBC: 2.48 MIL/uL — ABNORMAL LOW (ref 3.87–5.11)
RDW: 14.2 % (ref 11.5–15.5)
WBC Count: 5.5 10*3/uL (ref 4.0–10.5)
nRBC: 0 % (ref 0.0–0.2)

## 2022-03-26 LAB — SAMPLE TO BLOOD BANK

## 2022-03-26 MED ORDER — LEUCOVORIN CALCIUM INJECTION 350 MG
400.0000 mg/m2 | Freq: Once | INTRAVENOUS | Status: AC
  Administered 2022-03-26: 804 mg via INTRAVENOUS
  Filled 2022-03-26: qty 40.2

## 2022-03-26 MED ORDER — SODIUM CHLORIDE 0.9% FLUSH
10.0000 mL | Freq: Once | INTRAVENOUS | Status: AC
  Administered 2022-03-26: 10 mL

## 2022-03-26 MED ORDER — ONDANSETRON HCL 4 MG/2ML IJ SOLN
8.0000 mg | Freq: Once | INTRAMUSCULAR | Status: AC
  Administered 2022-03-26: 8 mg via INTRAVENOUS
  Filled 2022-03-26: qty 4

## 2022-03-26 MED ORDER — SODIUM CHLORIDE 0.9 % IV SOLN: INTRAVENOUS | Status: DC

## 2022-03-26 MED ORDER — SODIUM CHLORIDE 0.9 % IV SOLN
2490.0000 mg/m2 | INTRAVENOUS | Status: DC
  Administered 2022-03-26: 5000 mg via INTRAVENOUS
  Filled 2022-03-26: qty 100

## 2022-03-26 MED ORDER — FLUOROURACIL CHEMO INJECTION 2.5 GM/50ML
400.0000 mg/m2 | Freq: Once | INTRAVENOUS | Status: AC
  Administered 2022-03-26: 800 mg via INTRAVENOUS
  Filled 2022-03-26: qty 16

## 2022-03-26 NOTE — Progress Notes (Signed)
Denies anyHematology and Oncology Follow Up Visit ? ?Mary Stark ?[id number] ?1956-08-07 66 y.o. ?03/26/2022 11:16 AM ?Mary Fiscal, NP  ? ?[name]: 66 year old woman with bladder cancer presented in November 2021.  She was found to have stage IV adenocarcinoma with pelvic adenopathy. ? ? ?Prior Therapy: ? ?She is status post retroperitoneal lymph node biopsy completed on 10/24/2020 which showed metastatic carcinoma. ? ?She is status post repeat cystoscopy and bilateral ureteral stent exchange as well as resection of a bladder tumor on January 21, 2021.  A final pathology showed an adenocarcinoma. ? ?Chemotherapy utilizing carboplatin and gemcitabine started on November 22, 2020.  She completed 6 cycles of therapy on March 06, 2021. ? ?FOLFOX chemotherapy started on Apr 15, 2021.  She completed 12 cycles of therapy. ? ?She is status post radiation therapy to a right psoas metastatic lesion. She will complete 50 Gray in 5 fractions on February 27, 2022. ? ?Current therapy: 5-FU and leucovorin chemotherapy every 2 weeks started in November 2022.  She presents today for the next cycle of therapy. ? ? ? ? ? ?Interim History: Mary Stark is here for a follow-up evaluation.  Since the last visit, she reports of feeling well without any major complaints.  She tolerated the last cycle of chemotherapy without any issues.  She has continued to increase her exercise tolerance including exercise on a recumbent bike.  She denies any chest pain or shortness of breath.  She denies any difficulty breathing.  She does report left-sided groin and inguinal discomfort which has been overall minor and not changed dramatically. ? ? ? ? ?Medications: Reviewed without any changes. ?Current Outpatient Medications  ?Medication Sig Dispense Refill  ? ARIPiprazole (ABILIFY) 10 MG tablet TAKE 1 TABLET BY MOUTH ONCE DAILY 90 tablet 0  ? acetaminophen (TYLENOL) 500 MG tablet Take 625 mg by mouth every 8 (eight) hours as  needed for moderate pain.    ? albuterol (PROAIR HFA) 108 (90 Base) MCG/ACT inhaler INHALE 1 TO 2 PUFFS BY MOUTH INTO THE LUNGS EVERY 4 HOURS AS NEEDED FOR WHEEZING OR SHORTNESS OF BREATH 8 g 1  ? amLODipine (NORVASC) 5 MG tablet Take 5 mg by mouth as needed.    ? ascorbic acid (VITAMIN C) 500 MG tablet Take 1,000 mg by mouth every evening.     ? azelastine (ASTELIN) 0.1 % nasal spray Place 2 sprays into both nostrils 2 (two) times daily. Use in each nostril as directed 301 mL 1  ? benzonatate (TESSALON) 200 MG capsule Take 1 capsule (200 mg total) by mouth 3 (three) times daily as needed for cough. 30 capsule 0  ? buPROPion (WELLBUTRIN XL) 300 MG 24 hr tablet TAKE 1 TABLET BY MOUTH EVERY DAY 30 tablet 3  ? cephALEXin (KEFLEX) 250 MG capsule Take 1 capsule by mouth nightly as directed 90 capsule 1  ? Cholecalciferol (VITAMIN D) 125 MCG (5000 UT) CAPS Take 5,000 Units by mouth every evening.     ? clonazePAM (KLONOPIN) 1 MG tablet Take 1 tablet (1 mg total) by mouth at bedtime. 30 tablet 2  ? Coenzyme Q10 (CO Q-10) 200 MG CAPS Take 200 mg by mouth every evening.     ? cycloSPORINE (RESTASIS) 0.05 % ophthalmic emulsion Place 1 drop both eyes twice a day 180 each 3  ? escitalopram (LEXAPRO) 5 MG tablet Take 1 tablet by mouth daily. 30 tablet 3  ? fluticasone-salmeterol (ADVAIR HFA) 115-21 MCG/ACT inhaler Inhale 2 puffs into the lungs  2 (two) times daily. (Patient taking differently: Inhale 2 puffs into the lungs 2 (two) times daily. Havent had it filled) 1 each 0  ? gabapentin (NEURONTIN) 300 MG capsule Take 1 capsule (300 mg total) by mouth 4 (four) times daily. 360 capsule 1  ? GLUCOSAMINE-CHONDROITIN PO Take 2 tablets by mouth every evening.     ? lidocaine-prilocaine (EMLA) cream Apply 1 application topically as needed. 30 g 0  ? Meth-Hyo-M Bl-Na Phos-Ph Sal (URIBEL) 118 MG CAPS Take one capsule by mouth 3 times daily (Patient taking differently: as needed.) 30 capsule 3  ? mirabegron ER (MYRBETRIQ) 25 MG TB24  tablet TAKE 1 TABLET BY MOUTH ONCE A DAY 30 tablet 3  ? Nebivolol HCl 20 MG TABS Take 1/2 tablet (10 mg total) by mouth daily. 90 tablet 1  ? nitrofurantoin, macrocrystal-monohydrate, (MACROBID) 100 MG capsule Take 1 capsule (100 mg total) by mouth 2 (two) times daily. 10 capsule 0  ? Omega-3 Fatty Acids (FISH OIL) 1200 MG CAPS Take 1,200 mg by mouth every evening.     ? omeprazole (PRILOSEC) 20 MG capsule Take 1 capsule (20 mg total) by mouth daily. 90 capsule 3  ? ondansetron (ZOFRAN) 4 MG tablet Take 1 tablet (4 mg total) by mouth every 8 (eight) hours as needed for nausea or vomiting. 40 tablet 3  ? oxybutynin (DITROPAN) 5 MG tablet TAKE 1 TABLET BY MOUTH EVERY 8 HOURS AS NEEDED 90 tablet 3  ? oxymetazoline (AFRIN) 0.05 % nasal spray Place 1 spray into both nostrils 2 (two) times daily as needed for congestion. For nose bleeds    ? Phenazopyridine HCl (AZO URINARY PAIN PO) Take 2 tablets by mouth 3 (three) times daily as needed (bladder spasms).    ? simvastatin (ZOCOR) 20 MG tablet TAKE 1 TABLET (20 MG TOTAL) BY MOUTH DAILY. (Patient taking differently: Take 20 mg by mouth at bedtime.) 90 tablet 3  ? tamsulosin (FLOMAX) 0.4 MG CAPS capsule TAKE 1 CAPSULE BY MOUTH AT BEDTIME 30 capsule 3  ? thiamine 100 MG tablet Take 1 tablet (100 mg total) by mouth daily. 30 tablet 0  ? traMADol (ULTRAM) 50 MG tablet Take 1 tablet (50 mg total) by mouth 3 (three) times daily as needed. 30 tablet 3  ? vitamin B-12 (CYANOCOBALAMIN) 1000 MCG tablet Take 2,000 mcg by mouth every evening.     ? Vitamin E 180 MG (400 UNIT) CAPS Take 400 Units by mouth every evening.     ? ?No current facility-administered medications for this visit.  ? ?Facility-Administered Medications Ordered in Other Visits  ?Medication Dose Route Frequency Provider Last Rate Last Admin  ? sodium chloride flush (NS) 0.9 % injection 10 mL  10 mL Intracatheter Once Wyatt Portela, MD      ? ? ? ?Allergies:  ?Allergies  ?Allergen Reactions  ? Metformin And Related  Other (See Comments)  ?  Lactic acidosis   ? Penicillins Hives and Rash  ?  Reports hives to penicillin and amoxicillin as an adult "a long time ago." Has tolerated cefdinir (09/2020) and cefepime (10/2020) before.  ? Metformin   ?  [place] MCNO:[id number] ?[place] UTML:[id number]  ? Penicillin G Sodium   ?  [place] LEXN:[id number] ?[place] QPRF:[id number] ?[place] TTSV:[id number]  ? ? ? ? ?Physical Exam: ? ? ? ? ?Blood pressure 135/73, pulse 73, temperature (!) 97.2 ?F (36.2 ?C), resp. rate 20, weight 176 lb 8 oz (80.1 kg), SpO2 100 %. ? ? ? ? ? ? ? ?  ECOG: 1 ? ? ? ?General appearance: Alert, awake without any distress. ?Head: Atraumatic without abnormalities ?Oropharynx: Without any thrush or ulcers. ?Eyes: No scleral icterus. ?Lymph nodes: No lymphadenopathy noted in the cervical, supraclavicular, or axillary nodes ?Heart:regular rate and rhythm, without any murmurs or gallops.   ?Lung: Clear to auscultation without any rhonchi, wheezes or dullness to percussion. ?Abdomin: Soft, nontender without any shifting dullness or ascites. ?Musculoskeletal: No clubbing or cyanosis. ?Neurological: No motor or sensory deficits. ?Skin: No rashes or lesions. ? ? ? ? ? ? ? ? ? ? ? ? ? ? ? ? ? ? ? ? ?Lab Results: ?Lab Results  ?Component Value Date  ? WBC 6.0 03/11/2022  ? HGB 9.5 (L) 03/11/2022  ? HCT 28.7 (L) 03/11/2022  ? MCV 108.3 (H) 03/11/2022  ? PLT 113 (L) 03/11/2022  ? ?  Chemistry   ?   ?Component Value Date/Time  ? NA 135 03/11/2022 0823  ? NA 142 04/26/2018 1333  ? K 3.9 03/11/2022 0823  ? CL 102 03/11/2022 0823  ? CO2 24 03/11/2022 0823  ? BUN 24 (H) 03/11/2022 2409  ? BUN 22 04/26/2018 1333  ? CREATININE 1.07 (H) 03/11/2022 7353  ? CREATININE 1.32 (H) 06/04/2021 0000  ? GLU 87 02/07/2013 0000  ?    ?Component Value Date/Time  ? CALCIUM 9.0 03/11/2022 0823  ? ALKPHOS 83 03/11/2022 0823  ? AST 13 (L) 03/11/2022 2992  ? ALT 9 03/11/2022 0823  ? BILITOT 0.4 03/11/2022 4268  ?  ? ? ? ? ? ?Impression and Plan: ? ? ? ?66 year old woman  with: ?  ?1.   Stage IV adenocarcinoma of the bladder diagnosed in November 2021.  She presented with pelvic lymphadenopathy. ? ?She continues to be on maintenance 5-FU and leucovorin without any major compli

## 2022-03-28 ENCOUNTER — Inpatient Hospital Stay: Payer: No Typology Code available for payment source

## 2022-03-28 VITALS — BP 134/84 | HR 72 | Temp 98.1°F | Resp 15

## 2022-03-28 DIAGNOSIS — C801 Malignant (primary) neoplasm, unspecified: Secondary | ICD-10-CM

## 2022-03-28 DIAGNOSIS — C669 Malignant neoplasm of unspecified ureter: Secondary | ICD-10-CM

## 2022-03-28 DIAGNOSIS — Z5111 Encounter for antineoplastic chemotherapy: Secondary | ICD-10-CM | POA: Diagnosis not present

## 2022-03-28 MED ORDER — SODIUM CHLORIDE 0.9% FLUSH
10.0000 mL | INTRAVENOUS | Status: DC | PRN
  Administered 2022-03-28: 10 mL

## 2022-03-28 MED ORDER — HEPARIN SOD (PORK) LOCK FLUSH 100 UNIT/ML IV SOLN
500.0000 [IU] | Freq: Once | INTRAVENOUS | Status: AC | PRN
  Administered 2022-03-28: 500 [IU]

## 2022-03-30 ENCOUNTER — Other Ambulatory Visit (HOSPITAL_COMMUNITY): Payer: Self-pay

## 2022-04-01 ENCOUNTER — Encounter: Payer: Self-pay | Admitting: Urology

## 2022-04-01 NOTE — Progress Notes (Addendum)
Telephone appointment. I verified patient identity and began nursing interview. Patient reports LT groin pain 4/10 on occasion. No other issues reported at this time. ? ?Meaningful use complete. ?Postmenopausal- No chances of pregnancy. ? ?Patient reminded of her 9:00am-04/02/22 telephone appointment w/ Ashlyn Bruning PA-C. I left my extension [phone] in case patient needs anything. Patient verbalized understanding of information. ? ?Patient contact [phone] ?

## 2022-04-02 ENCOUNTER — Ambulatory Visit
Admission: RE | Admit: 2022-04-02 | Discharge: 2022-04-02 | Disposition: A | Discharge status: Home / Self Care | Payer: No Typology Code available for payment source | Source: Ambulatory Visit | Attending: Urology | Admitting: Urology

## 2022-04-02 NOTE — Progress Notes (Signed)
?Radiation Oncology         [phone] ?________________________________ ? ?Name: Mary Stark MRN: [id number]  ?Date: 02/19/2022  DOB: 1956/03/28 ? ?Post Treatment Note ? ?CC: [name], NP  Wyatt Portela, MD ? ?Diagnosis:    66 yo woman with an enlarging abdominal lymph node along the right psoas muscle secondary to oligoprogression of metastatic adenocarcinoma of the bladder.  ? ?Interval Since Last Radiation:  5 weeks  ? 02/17/22 - 02/27/22:  The enlarging abdominal lymph node along the right psoas muscle was treated to 50 Gy in 5 fractions of 10 Gy ? ?Narrative:  I spoke with the patient to conduct her routine scheduled 1 month follow up visit via telephone to spare the patient unnecessary potential exposure in the healthcare setting during the current COVID-19 pandemic.  The patient was notified in advance and gave permission to proceed with this visit format. ? ?She tolerated radiation treatment relatively well without any ill side effects.                              ? ?On review of systems, the patient states that she is doing very well in general.  She continues with decreased hemoglobin which makes her tired, secondary to her chemotherapy, but otherwise, is without complaints.  She specifically denies abdominal pain, nausea, vomiting, diarrhea or constipation.  She denies any skin irritation and overall, is quite pleased with her progress to date.  She had a recent follow-up visit with Dr. Alen Blew on 03/26/2022 and the current plan is to continue on maintenance 5-FU and leucovorin with disease restaging imaging in May 2023 to determine further treatment recommendations. ? ?ALLERGIES:  is allergic to metformin and related, penicillins, metformin, and penicillin g sodium. ? ?Meds: ?Current Outpatient Medications  ?Medication Sig Dispense Refill  ? ARIPiprazole (ABILIFY) 10 MG tablet TAKE 1 TABLET BY MOUTH ONCE DAILY 90 tablet 0  ? acetaminophen (TYLENOL) 500 MG tablet Take 625 mg by mouth every 8  (eight) hours as needed for moderate pain.    ? albuterol (PROAIR HFA) 108 (90 Base) MCG/ACT inhaler INHALE 1 TO 2 PUFFS BY MOUTH INTO THE LUNGS EVERY 4 HOURS AS NEEDED FOR WHEEZING OR SHORTNESS OF BREATH 8 g 1  ? amLODipine (NORVASC) 5 MG tablet Take 5 mg by mouth as needed.    ? ascorbic acid (VITAMIN C) 500 MG tablet Take 1,000 mg by mouth every evening.     ? azelastine (ASTELIN) 0.1 % nasal spray Place 2 sprays into both nostrils 2 (two) times daily. Use in each nostril as directed 301 mL 1  ? benzonatate (TESSALON) 200 MG capsule Take 1 capsule (200 mg total) by mouth 3 (three) times daily as needed for cough. 30 capsule 0  ? buPROPion (WELLBUTRIN XL) 300 MG 24 hr tablet TAKE 1 TABLET BY MOUTH EVERY DAY 30 tablet 3  ? cephALEXin (KEFLEX) 250 MG capsule Take 1 capsule by mouth nightly as directed 90 capsule 1  ? Cholecalciferol (VITAMIN D) 125 MCG (5000 UT) CAPS Take 5,000 Units by mouth every evening.     ? clonazePAM (KLONOPIN) 1 MG tablet Take 1 tablet (1 mg total) by mouth at bedtime. 30 tablet 2  ? Coenzyme Q10 (CO Q-10) 200 MG CAPS Take 200 mg by mouth every evening.     ? cycloSPORINE (RESTASIS) 0.05 % ophthalmic emulsion Place 1 drop both eyes twice a day 180 each 3  ?  escitalopram (LEXAPRO) 5 MG tablet Take 1 tablet by mouth daily. 30 tablet 3  ? fluticasone-salmeterol (ADVAIR HFA) 115-21 MCG/ACT inhaler Inhale 2 puffs into the lungs 2 (two) times daily. (Patient taking differently: Inhale 2 puffs into the lungs 2 (two) times daily. Havent had it filled) 1 each 0  ? gabapentin (NEURONTIN) 300 MG capsule Take 1 capsule (300 mg total) by mouth 4 (four) times daily. 360 capsule 1  ? GLUCOSAMINE-CHONDROITIN PO Take 2 tablets by mouth every evening.     ? lidocaine-prilocaine (EMLA) cream Apply 1 application topically as needed. 30 g 0  ? Meth-Hyo-M Bl-Na Phos-Ph Sal (URIBEL) 118 MG CAPS Take one capsule by mouth 3 times daily (Patient taking differently: as needed.) 30 capsule 3  ? mirabegron ER  (MYRBETRIQ) 25 MG TB24 tablet TAKE 1 TABLET BY MOUTH ONCE A DAY 30 tablet 3  ? Nebivolol HCl 20 MG TABS Take 1/2 tablet (10 mg total) by mouth daily. 90 tablet 1  ? nitrofurantoin, macrocrystal-monohydrate, (MACROBID) 100 MG capsule Take 1 capsule (100 mg total) by mouth 2 (two) times daily. 10 capsule 0  ? Omega-3 Fatty Acids (FISH OIL) 1200 MG CAPS Take 1,200 mg by mouth every evening.     ? omeprazole (PRILOSEC) 20 MG capsule Take 1 capsule (20 mg total) by mouth daily. 90 capsule 3  ? ondansetron (ZOFRAN) 4 MG tablet Take 1 tablet (4 mg total) by mouth every 8 (eight) hours as needed for nausea or vomiting. 40 tablet 3  ? oxybutynin (DITROPAN) 5 MG tablet TAKE 1 TABLET BY MOUTH EVERY 8 HOURS AS NEEDED 90 tablet 3  ? oxymetazoline (AFRIN) 0.05 % nasal spray Place 1 spray into both nostrils 2 (two) times daily as needed for congestion. For nose bleeds    ? Phenazopyridine HCl (AZO URINARY PAIN PO) Take 2 tablets by mouth 3 (three) times daily as needed (bladder spasms).    ? simvastatin (ZOCOR) 20 MG tablet TAKE 1 TABLET (20 MG TOTAL) BY MOUTH DAILY. (Patient taking differently: Take 20 mg by mouth at bedtime.) 90 tablet 3  ? tamsulosin (FLOMAX) 0.4 MG CAPS capsule TAKE 1 CAPSULE BY MOUTH AT BEDTIME 30 capsule 3  ? thiamine 100 MG tablet Take 1 tablet (100 mg total) by mouth daily. 30 tablet 0  ? traMADol (ULTRAM) 50 MG tablet Take 1 tablet (50 mg total) by mouth 3 (three) times daily as needed. 30 tablet 3  ? vitamin B-12 (CYANOCOBALAMIN) 1000 MCG tablet Take 2,000 mcg by mouth every evening.     ? Vitamin E 180 MG (400 UNIT) CAPS Take 400 Units by mouth every evening.     ? ?No current facility-administered medications for this encounter.  ? ? ?Physical Findings: ? vitals were not taken for this visit.  ? /10 ?Unable to assess due to telephone follow-up visit format..  ? ?Lab Findings: ?Lab Results  ?Component Value Date  ? WBC 5.5 03/26/2022  ? HGB 8.8 (L) 03/26/2022  ? HCT 26.3 (L) 03/26/2022  ? MCV 106.0 (H)  03/26/2022  ? PLT 178 03/26/2022  ? ? ? ?Radiographic Findings: ?No results found. ? ?Impression/Plan: ?85.  66 yo woman with an enlarging abdominal lymph node along the right psoas muscle secondary to oligoprogression of metastatic adenocarcinoma of the bladder.  ?She appears to have recovered well from the effects of her recent SBRT and is currently without complaints.  We discussed that while we are happy to continue to participate in her care if clinically indicated,  at this point, we will plan to see her back on an as-needed basis.  She will continue in routine follow-up under the care and direction of Dr. Alen Blew for continued management of her systemic disease. The current plan is to continue on maintenance 5-FU and leucovorin with disease restaging imaging in May 2023 to determine further treatment recommendations.  We have enjoyed taking care of her and look forward to continuing to follow her progress via correspondence.  She knows that she is welcome to call at anytime with any questions or concerns related to her radiation treatment. ? ? ? ?Nicholos Johns, PA-C  ?

## 2022-04-02 NOTE — Progress Notes (Signed)
?  Radiation Oncology         [phone] ?________________________________ ? ?Name: Mary Stark MRN: [id number]  ?Date: 02/27/2022  DOB: [birth year] ? ?End of Treatment Note ? 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    ? ?Indication for treatment:  Curative, Definitive SBRT      ? ?Radiation treatment dates:   02/17/22 - 02/27/22 ? ?Site/dose:   The enlarging abdominal lymph node along the right psoas muscle was treated to 50 Gy in 5 fractions of 10 Gy ? ?Beams/energy:   The patient was treated using stereotactic body radiotherapy according to a 3D conformal radiotherapy plan.  Volumetric arc fields were employed to deliver 6 MV X-rays.  Image guidance was performed with per fraction cone beam CT prior to treatment under personal MD supervision.  Immobilization was achieved using BodyFix Pillow. ? ?Narrative: The patient tolerated radiation treatment relatively well.    ? ?Plan: The patient has completed radiation treatment. The patient will return to radiation oncology clinic for routine followup in one month. I advised them to call or return sooner if they have any questions or concerns related to their recovery or treatment. ?________________________________ ? ?Sheral Apley Tammi Klippel, M.D. ? ?  ?

## 2022-04-07 ENCOUNTER — Inpatient Hospital Stay (HOSPITAL_BASED_OUTPATIENT_CLINIC_OR_DEPARTMENT_OTHER): Payer: No Typology Code available for payment source | Admitting: Oncology

## 2022-04-07 ENCOUNTER — Other Ambulatory Visit: Payer: Self-pay

## 2022-04-07 ENCOUNTER — Inpatient Hospital Stay: Payer: No Typology Code available for payment source

## 2022-04-07 VITALS — BP 113/76 | HR 80 | Temp 97.5°F | Resp 17 | Wt 172.9 lb

## 2022-04-07 DIAGNOSIS — C801 Malignant (primary) neoplasm, unspecified: Secondary | ICD-10-CM | POA: Diagnosis not present

## 2022-04-07 DIAGNOSIS — C669 Malignant neoplasm of unspecified ureter: Secondary | ICD-10-CM

## 2022-04-07 DIAGNOSIS — Z95828 Presence of other vascular implants and grafts: Secondary | ICD-10-CM | POA: Diagnosis not present

## 2022-04-07 DIAGNOSIS — Z5111 Encounter for antineoplastic chemotherapy: Secondary | ICD-10-CM | POA: Diagnosis not present

## 2022-04-07 LAB — CBC WITH DIFFERENTIAL (CANCER CENTER ONLY)
Abs Immature Granulocytes: 0.02 10*3/uL (ref 0.00–0.07)
Basophils Absolute: 0 10*3/uL (ref 0.0–0.1)
Basophils Relative: 1 %
Eosinophils Absolute: 0.1 10*3/uL (ref 0.0–0.5)
Eosinophils Relative: 1 %
HCT: 28.2 % — ABNORMAL LOW (ref 36.0–46.0)
Hemoglobin: 9.4 g/dL — ABNORMAL LOW (ref 12.0–15.0)
Immature Granulocytes: 0 %
Lymphocytes Relative: 20 %
Lymphs Abs: 1.3 10*3/uL (ref 0.7–4.0)
MCH: 35.7 pg — ABNORMAL HIGH (ref 26.0–34.0)
MCHC: 33.3 g/dL (ref 30.0–36.0)
MCV: 107.2 fL — ABNORMAL HIGH (ref 80.0–100.0)
Monocytes Absolute: 0.8 10*3/uL (ref 0.1–1.0)
Monocytes Relative: 12 %
Neutro Abs: 4.4 10*3/uL (ref 1.7–7.7)
Neutrophils Relative %: 66 %
Platelet Count: 142 10*3/uL — ABNORMAL LOW (ref 150–400)
RBC: 2.63 MIL/uL — ABNORMAL LOW (ref 3.87–5.11)
RDW: 15 % (ref 11.5–15.5)
WBC Count: 6.6 10*3/uL (ref 4.0–10.5)
nRBC: 0 % (ref 0.0–0.2)

## 2022-04-07 LAB — CMP (CANCER CENTER ONLY)
ALT: 9 U/L (ref 0–44)
AST: 13 U/L — ABNORMAL LOW (ref 15–41)
Albumin: 4 g/dL (ref 3.5–5.0)
Alkaline Phosphatase: 87 U/L (ref 38–126)
Anion gap: 8 (ref 5–15)
BUN: 26 mg/dL — ABNORMAL HIGH (ref 8–23)
CO2: 26 mmol/L (ref 22–32)
Calcium: 9.3 mg/dL (ref 8.9–10.3)
Chloride: 99 mmol/L (ref 98–111)
Creatinine: 1.4 mg/dL — ABNORMAL HIGH (ref 0.44–1.00)
GFR, Estimated: 42 mL/min — ABNORMAL LOW (ref >60)
Glucose, Bld: 110 mg/dL — ABNORMAL HIGH (ref 70–99)
Potassium: 4.5 mmol/L (ref 3.5–5.1)
Sodium: 133 mmol/L — ABNORMAL LOW (ref 135–145)
Total Bilirubin: 0.3 mg/dL (ref 0.3–1.2)
Total Protein: 7.2 g/dL (ref 6.5–8.1)

## 2022-04-07 LAB — SAMPLE TO BLOOD BANK

## 2022-04-07 MED ORDER — FLUOROURACIL CHEMO INJECTION 2.5 GM/50ML
400.0000 mg/m2 | Freq: Once | INTRAVENOUS | Status: AC
  Administered 2022-04-07: 800 mg via INTRAVENOUS
  Filled 2022-04-07: qty 16

## 2022-04-07 MED ORDER — SODIUM CHLORIDE 0.9 % IV SOLN
2490.0000 mg/m2 | INTRAVENOUS | Status: DC
  Administered 2022-04-07: 5000 mg via INTRAVENOUS
  Filled 2022-04-07: qty 100

## 2022-04-07 MED ORDER — SODIUM CHLORIDE 0.9% FLUSH: 10.0000 mL | INTRAVENOUS | Status: DC | PRN

## 2022-04-07 MED ORDER — LEUCOVORIN CALCIUM INJECTION 350 MG
400.0000 mg/m2 | Freq: Once | INTRAVENOUS | Status: AC
  Administered 2022-04-07: 804 mg via INTRAVENOUS
  Filled 2022-04-07: qty 25

## 2022-04-07 MED ORDER — ONDANSETRON HCL 4 MG/2ML IJ SOLN
8.0000 mg | Freq: Once | INTRAMUSCULAR | Status: AC
  Administered 2022-04-07: 8 mg via INTRAVENOUS
  Filled 2022-04-07: qty 4

## 2022-04-07 MED ORDER — SODIUM CHLORIDE 0.9% FLUSH
10.0000 mL | Freq: Once | INTRAVENOUS | Status: AC
  Administered 2022-04-07: 10 mL

## 2022-04-07 MED ORDER — DEXTROSE 5 % IV SOLN: Freq: Once | INTRAVENOUS | Status: AC

## 2022-04-07 NOTE — Patient Instructions (Signed)
[facility]  Discharge Instructions: ?Thank you for choosing [facility] to provide your oncology and hematology care.  ? ?If you have a lab appointment with the [facility], please go directly to the [facility] and check in at the registration area. ?  ?Wear comfortable clothing and clothing appropriate for easy access to any Portacath or PICC line.  ? ?We strive to give you quality time with your provider. You may need to reschedule your appointment if you arrive late (15 or more minutes).  Arriving late affects you and other patients whose appointments are after yours.  Also, if you miss three or more appointments without notifying the office, you may be dismissed from the clinic at the provider?s discretion.    ?  ?For prescription refill requests, have your pharmacy contact our office and allow 72 hours for refills to be completed.   ? ?Today you received the following chemotherapy and/or immunotherapy agents: Leucovorin/ 5FU    ?  ?To help prevent nausea and vomiting after your treatment, we encourage you to take your nausea medication as directed. ? ?BELOW ARE SYMPTOMS THAT SHOULD BE REPORTED IMMEDIATELY: ?*FEVER GREATER THAN 100.4 F (38 ?C) OR HIGHER ?*CHILLS OR SWEATING ?*NAUSEA AND VOMITING THAT IS NOT CONTROLLED WITH YOUR NAUSEA MEDICATION ?*UNUSUAL SHORTNESS OF BREATH ?*UNUSUAL BRUISING OR BLEEDING ?*URINARY PROBLEMS (pain or burning when urinating, or frequent urination) ?*BOWEL PROBLEMS (unusual diarrhea, constipation, pain near the anus) ?TENDERNESS IN MOUTH AND THROAT WITH OR WITHOUT PRESENCE OF ULCERS (sore throat, sores in mouth, or a toothache) ?UNUSUAL RASH, SWELLING OR PAIN  ?UNUSUAL VAGINAL DISCHARGE OR ITCHING  ? ?Items with * indicate a potential emergency and should be followed up as soon as possible or go to the Emergency Department if any problems should occur. ? ?Please show the CHEMOTHERAPY ALERT CARD or IMMUNOTHERAPY ALERT CARD at  check-in to the Emergency Department and triage nurse. ? ?Should you have questions after your visit or need to cancel or reschedule your appointment, please contact [facility]  Dept: [phone]  and follow the prompts.  Office hours are 8:00 a.m. to 4:30 p.m. Monday - Friday. Please note that voicemails left after 4:00 p.m. may not be returned until the following business day.  We are closed weekends and major holidays. You have access to a nurse at all times for urgent questions. Please call the main number to the clinic Dept: [phone] and follow the prompts. ? ? ?For any non-urgent questions, you may also contact your provider using MyChart. We now offer e-Visits for anyone 43 and older to request care online for non-urgent symptoms. For details visit [email or web address]. ?  ?Also download the MyChart app! Go to the app store, search "MyChart", open the app, select [facility], and log in with your MyChart username and password. ? ?Due to Covid, a mask is required upon entering the hospital/clinic. If you do not have a mask, one will be given to you upon arrival. For doctor visits, patients may have 1 support person aged 94 or older with them. For treatment visits, patients cannot have anyone with them due to current Covid guidelines and our immunocompromised population.  ? ?

## 2022-04-07 NOTE — Progress Notes (Signed)
Denies anyHematology and Oncology Follow Up Visit ? ?Mary Stark ?[id number] ?[birth year] 66 y.o. ?04/07/2022 8:36 AM ?Mary Fiscal, NP  ? 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 ? ? ?Prior Therapy: ? ?She is status post retroperitoneal lymph node biopsy completed on 10/24/2020 which showed metastatic carcinoma. ? ?She is status post repeat cystoscopy and bilateral ureteral stent exchange as well as resection of a bladder tumor on January 21, 2021.  A final pathology showed an adenocarcinoma. ? ?Chemotherapy utilizing carboplatin and gemcitabine started on November 22, 2020.  She completed 6 cycles of therapy on March 06, 2021. ? ?FOLFOX chemotherapy started on Apr 15, 2021.  She completed 12 cycles of therapy. ? ?She is status post radiation therapy to a right psoas metastatic lesion. She will complete 50 Gray in 5 fractions on February 27, 2022. ? ?Current therapy: 5-FU and leucovorin chemotherapy every 2 weeks started in November 2022.  She is here for the subsequent cycle of treatment. ? ? ? ? ? ?Interim History: Mary Stark returns today for a follow-up visit.  Since last visit, she reports a few complaints.  She has reported some dizziness and lightheadedness and occasional fatigue.  She is currently dieting and taking an appetite suppressing medication.  She is also restricting her diet due to protein supplements and 1 full meal at nighttime.  She has lost close to 5 pounds in the last month. ? ? ? ? ?Medications: Updated on review. ?Current Outpatient Medications  ?Medication Sig Dispense Refill  ? ARIPiprazole (ABILIFY) 10 MG tablet TAKE 1 TABLET BY MOUTH ONCE DAILY 90 tablet 0  ? acetaminophen (TYLENOL) 500 MG tablet Take 625 mg by mouth every 8 (eight) hours as needed for moderate pain.    ? albuterol (PROAIR HFA) 108 (90 Base) MCG/ACT inhaler INHALE 1 TO 2 PUFFS BY MOUTH INTO THE LUNGS EVERY 4 HOURS  AS NEEDED FOR WHEEZING OR SHORTNESS OF BREATH 8 g 1  ? amLODipine (NORVASC) 5 MG tablet Take 5 mg by mouth as needed.    ? ascorbic acid (VITAMIN C) 500 MG tablet Take 1,000 mg by mouth every evening.     ? azelastine (ASTELIN) 0.1 % nasal spray Place 2 sprays into both nostrils 2 (two) times daily. Use in each nostril as directed 301 mL 1  ? benzonatate (TESSALON) 200 MG capsule Take 1 capsule (200 mg total) by mouth 3 (three) times daily as needed for cough. 30 capsule 0  ? buPROPion (WELLBUTRIN XL) 300 MG 24 hr tablet TAKE 1 TABLET BY MOUTH EVERY DAY 30 tablet 3  ? cephALEXin (KEFLEX) 250 MG capsule Take 1 capsule by mouth nightly as directed 90 capsule 1  ? Cholecalciferol (VITAMIN D) 125 MCG (5000 UT) CAPS Take 5,000 Units by mouth every evening.     ? clonazePAM (KLONOPIN) 1 MG tablet Take 1 tablet (1 mg total) by mouth at bedtime. 30 tablet 2  ? Coenzyme Q10 (CO Q-10) 200 MG CAPS Take 200 mg by mouth every evening.     ? cycloSPORINE (RESTASIS) 0.05 % ophthalmic emulsion Place 1 drop both eyes twice a day 180 each 3  ? escitalopram (LEXAPRO) 5 MG tablet Take 1 tablet by mouth daily. 30 tablet 3  ? fluticasone-salmeterol (ADVAIR HFA) 115-21 MCG/ACT inhaler Inhale 2 puffs into the lungs 2 (two) times daily. (Patient taking differently: Inhale 2 puffs into the lungs 2 (two) times daily. Havent had it  filled) 1 each 0  ? gabapentin (NEURONTIN) 300 MG capsule Take 1 capsule (300 mg total) by mouth 4 (four) times daily. 360 capsule 1  ? GLUCOSAMINE-CHONDROITIN PO Take 2 tablets by mouth every evening.     ? lidocaine-prilocaine (EMLA) cream Apply 1 application topically as needed. 30 g 0  ? Meth-Hyo-M Bl-Na Phos-Ph Sal (URIBEL) 118 MG CAPS Take one capsule by mouth 3 times daily (Patient taking differently: as needed.) 30 capsule 3  ? mirabegron ER (MYRBETRIQ) 25 MG TB24 tablet TAKE 1 TABLET BY MOUTH ONCE A DAY 30 tablet 3  ? Nebivolol HCl 20 MG TABS Take 1/2 tablet (10 mg total) by mouth daily. 90 tablet 1  ?  nitrofurantoin, macrocrystal-monohydrate, (MACROBID) 100 MG capsule Take 1 capsule (100 mg total) by mouth 2 (two) times daily. 10 capsule 0  ? Omega-3 Fatty Acids (FISH OIL) 1200 MG CAPS Take 1,200 mg by mouth every evening.     ? omeprazole (PRILOSEC) 20 MG capsule Take 1 capsule (20 mg total) by mouth daily. 90 capsule 3  ? ondansetron (ZOFRAN) 4 MG tablet Take 1 tablet (4 mg total) by mouth every 8 (eight) hours as needed for nausea or vomiting. 40 tablet 3  ? oxybutynin (DITROPAN) 5 MG tablet TAKE 1 TABLET BY MOUTH EVERY 8 HOURS AS NEEDED 90 tablet 3  ? oxymetazoline (AFRIN) 0.05 % nasal spray Place 1 spray into both nostrils 2 (two) times daily as needed for congestion. For nose bleeds    ? Phenazopyridine HCl (AZO URINARY PAIN PO) Take 2 tablets by mouth 3 (three) times daily as needed (bladder spasms).    ? simvastatin (ZOCOR) 20 MG tablet TAKE 1 TABLET (20 MG TOTAL) BY MOUTH DAILY. (Patient taking differently: Take 20 mg by mouth at bedtime.) 90 tablet 3  ? tamsulosin (FLOMAX) 0.4 MG CAPS capsule TAKE 1 CAPSULE BY MOUTH AT BEDTIME 30 capsule 3  ? thiamine 100 MG tablet Take 1 tablet (100 mg total) by mouth daily. 30 tablet 0  ? traMADol (ULTRAM) 50 MG tablet Take 1 tablet (50 mg total) by mouth 3 (three) times daily as needed. 30 tablet 3  ? vitamin B-12 (CYANOCOBALAMIN) 1000 MCG tablet Take 2,000 mcg by mouth every evening.     ? Vitamin E 180 MG (400 UNIT) CAPS Take 400 Units by mouth every evening.     ? ?No current facility-administered medications for this visit.  ? ?Facility-Administered Medications Ordered in Other Visits  ?Medication Dose Route Frequency Provider Last Rate Last Admin  ? sodium chloride flush (NS) 0.9 % injection 10 mL  10 mL Intracatheter Once Wyatt Portela, MD      ? ? ? ?Allergies:  ?Allergies  ?Allergen Reactions  ? Metformin And Related Other (See Comments)  ?  Lactic acidosis   ? Penicillins Hives and Rash  ?  Reports hives to penicillin and amoxicillin as an adult "a long  time ago." Has tolerated cefdinir (09/2020) and cefepime (10/2020) before.  ? Metformin   ?  [place] LMBE:[id number] ?[place] FHQR:[id number]  ? Penicillin G Sodium   ?  [place] YMEB:[id number] ?[place] SUPJ:[id number] ?[place] TWKM:[id number]  ? ? ? ? ?Physical Exam: ? ? ? ? ? ?Blood pressure 113/76, pulse 80, temperature (!) 97.5 ?F (36.4 ?C), temperature source Tympanic, resp. rate 17, weight 172 lb 14.4 oz (78.4 kg), SpO2 98 %. ? ? ? ? ? ? ? ?ECOG: 1 ? ? ?General appearance: Comfortable appearing without any discomfort ?Head: Normocephalic without any trauma ?  Oropharynx: Mucous membranes are moist and pink without any thrush or ulcers. ?Eyes: Pupils are equal and round reactive to light. ?Lymph nodes: No cervical, supraclavicular, inguinal or axillary lymphadenopathy.   ?Heart:regular rate and rhythm.  S1 and S2 without leg edema. ?Lung: Clear without any rhonchi or wheezes.  No dullness to percussion. ?Abdomin: Soft, nontender, nondistended with good bowel sounds.  No hepatosplenomegaly. ?Musculoskeletal: No joint deformity or effusion.  Full range of motion noted. ?Neurological: No deficits noted on motor, sensory and deep tendon reflex exam. ?Skin: No petechial rash or dryness.  Appeared moist.  ? ? ? ? ? ? ? ? ? ? ? ? ? ? ? ? ? ? ? ? ?Lab Results: ?Lab Results  ?Component Value Date  ? WBC 5.5 03/26/2022  ? HGB 8.8 (L) 03/26/2022  ? HCT 26.3 (L) 03/26/2022  ? MCV 106.0 (H) 03/26/2022  ? PLT 178 03/26/2022  ? ?  Chemistry   ?   ?Component Value Date/Time  ? NA 131 (L) 03/26/2022 1129  ? NA 142 04/26/2018 1333  ? K 4.5 03/26/2022 1129  ? CL 99 03/26/2022 1129  ? CO2 24 03/26/2022 1129  ? BUN 24 (H) 03/26/2022 1129  ? BUN 22 04/26/2018 1333  ? CREATININE 1.24 (H) 03/26/2022 1129  ? CREATININE 1.32 (H) 06/04/2021 0000  ? GLU 87 02/07/2013 0000  ?    ?Component Value Date/Time  ? CALCIUM 9.2 03/26/2022 1129  ? ALKPHOS 83 03/26/2022 1129  ? AST 11 (L) 03/26/2022 1129  ? ALT 9 03/26/2022 1129  ? BILITOT 0.4 03/26/2022 1129  ?   ? ? ? ? ? ?Impression and Plan: ? ? ? ?66 year old woman with: ?  ?1.   Bladder cancer diagnosed in November 2021.  He was found to have stage IV tumor with pelvic adenopathy. ? ?Treatment options moving forw

## 2022-04-09 ENCOUNTER — Inpatient Hospital Stay: Payer: No Typology Code available for payment source

## 2022-04-09 ENCOUNTER — Other Ambulatory Visit: Payer: Self-pay

## 2022-04-09 VITALS — BP 106/70 | HR 83 | Temp 97.9°F | Resp 17

## 2022-04-09 DIAGNOSIS — C801 Malignant (primary) neoplasm, unspecified: Secondary | ICD-10-CM

## 2022-04-09 DIAGNOSIS — Z95828 Presence of other vascular implants and grafts: Secondary | ICD-10-CM

## 2022-04-09 DIAGNOSIS — Z5111 Encounter for antineoplastic chemotherapy: Secondary | ICD-10-CM | POA: Diagnosis not present

## 2022-04-09 MED ORDER — HEPARIN SOD (PORK) LOCK FLUSH 100 UNIT/ML IV SOLN
500.0000 [IU] | Freq: Once | INTRAVENOUS | Status: AC
  Administered 2022-04-09: 500 [IU]

## 2022-04-09 MED ORDER — SODIUM CHLORIDE 0.9% FLUSH
10.0000 mL | Freq: Once | INTRAVENOUS | Status: AC
  Administered 2022-04-09: 10 mL

## 2022-04-10 ENCOUNTER — Inpatient Hospital Stay: Payer: No Typology Code available for payment source

## 2022-04-10 ENCOUNTER — Other Ambulatory Visit (HOSPITAL_COMMUNITY): Payer: Self-pay

## 2022-04-10 ENCOUNTER — Other Ambulatory Visit: Payer: Self-pay | Admitting: *Deleted

## 2022-04-10 ENCOUNTER — Telehealth: Payer: Self-pay | Admitting: *Deleted

## 2022-04-10 ENCOUNTER — Telehealth: Payer: Self-pay | Admitting: Oncology

## 2022-04-10 VITALS — BP 113/70 | HR 70 | Temp 98.0°F | Resp 18

## 2022-04-10 DIAGNOSIS — C669 Malignant neoplasm of unspecified ureter: Secondary | ICD-10-CM

## 2022-04-10 DIAGNOSIS — Z5111 Encounter for antineoplastic chemotherapy: Secondary | ICD-10-CM | POA: Diagnosis not present

## 2022-04-10 DIAGNOSIS — C801 Malignant (primary) neoplasm, unspecified: Secondary | ICD-10-CM

## 2022-04-10 DIAGNOSIS — Z95828 Presence of other vascular implants and grafts: Secondary | ICD-10-CM

## 2022-04-10 MED ORDER — SODIUM CHLORIDE 0.9% FLUSH
10.0000 mL | Freq: Once | INTRAVENOUS | Status: AC
  Administered 2022-04-10: 10 mL

## 2022-04-10 MED ORDER — HEPARIN SOD (PORK) LOCK FLUSH 100 UNIT/ML IV SOLN
500.0000 [IU] | Freq: Once | INTRAVENOUS | Status: AC
  Administered 2022-04-10: 500 [IU]

## 2022-04-10 MED ORDER — SODIUM CHLORIDE 0.9 % IV SOLN: Freq: Once | INTRAVENOUS | Status: AC

## 2022-04-10 NOTE — Patient Instructions (Signed)

## 2022-04-10 NOTE — Telephone Encounter (Signed)
Judyth states she is weak, dizzy and cannot get out of bed. BP 88/63. Takes Klonopin, but states she cannot sleep without it. Fell twice last night. Feels like she might be dehydrated. Stopped diet pills as advised.  ?

## 2022-04-10 NOTE — Telephone Encounter (Signed)
We will give Mary Stark one liter of IVF. She can come at 1200.  ?

## 2022-04-10 NOTE — Telephone Encounter (Signed)
Called patient regarding upcoming appointments, left a voicemail. 

## 2022-04-13 ENCOUNTER — Other Ambulatory Visit (HOSPITAL_COMMUNITY): Payer: Self-pay

## 2022-04-13 ENCOUNTER — Ambulatory Visit (INDEPENDENT_AMBULATORY_CARE_PROVIDER_SITE_OTHER): Payer: No Typology Code available for payment source | Admitting: Sports Medicine

## 2022-04-13 DIAGNOSIS — M170 Bilateral primary osteoarthritis of knee: Secondary | ICD-10-CM | POA: Diagnosis not present

## 2022-04-13 NOTE — Progress Notes (Signed)
? ? ?  Procedures performed today:   ? ?None. ? ?Independent interpretation of notes and tests performed by another provider:  ? ?None. ? ?Brief History, Exam, Impression, and Recommendations:   ? ?Primary osteoarthritis of both knees ?Pleasant 66 year old female, known bilateral knee osteoarthritis, last injected March 20 of this year, previous injection was in November 2022. ?Overall doing really well, no pain, she is having a bit of weakness here and there but she has been on her chemotherapy, and has not been able to eat in a while, losing significant weight. ?I did ask her to discuss Remeron and Marinol with her oncologist. ?From an orthopedic standpoint she can see me as needed. ? ? ? ?___________________________________________ ?Gwen Her. Dianah Field, M.D., ABFM., CAQSM. ?Primary Care and Sports Medicine ?[facility] ? ?Adjunct Instructor of Family Medicine  ?[employer] ?

## 2022-04-13 NOTE — Assessment & Plan Note (Addendum)
Pleasant 66 year old female, known bilateral knee osteoarthritis, last injected March 20 of this year, previous injection was in November 2022. ?Overall doing really well, no pain, she is having a bit of weakness here and there but she has been on her chemotherapy, and has not been able to eat in a while, losing significant weight. ?I did ask her to discuss Remeron and Marinol with her oncologist. ?From an orthopedic standpoint she can see me as needed. ?

## 2022-04-13 NOTE — Patient Instructions (Signed)
Please discuss Remeron and Marinol with your oncologist with regards to your lack of appetite and weight loss during chemotherapy. ?

## 2022-04-17 ENCOUNTER — Other Ambulatory Visit: Payer: Self-pay | Admitting: Medical-Surgical

## 2022-04-17 ENCOUNTER — Other Ambulatory Visit (HOSPITAL_COMMUNITY): Payer: Self-pay

## 2022-04-17 ENCOUNTER — Encounter: Payer: Self-pay | Admitting: Medical-Surgical

## 2022-04-17 ENCOUNTER — Ambulatory Visit (INDEPENDENT_AMBULATORY_CARE_PROVIDER_SITE_OTHER): Payer: No Typology Code available for payment source | Admitting: Medical-Surgical

## 2022-04-17 VITALS — BP 113/72 | HR 63 | Resp 20 | Ht 66.0 in | Wt 173.3 lb

## 2022-04-17 DIAGNOSIS — F1090 Alcohol use, unspecified, uncomplicated: Secondary | ICD-10-CM

## 2022-04-17 DIAGNOSIS — F410 Panic disorder [episodic paroxysmal anxiety] without agoraphobia: Secondary | ICD-10-CM

## 2022-04-17 DIAGNOSIS — I10 Essential (primary) hypertension: Secondary | ICD-10-CM | POA: Diagnosis not present

## 2022-04-17 DIAGNOSIS — F418 Other specified anxiety disorders: Secondary | ICD-10-CM

## 2022-04-17 MED ORDER — SERTRALINE HCL 25 MG PO TABS
25.0000 mg | ORAL_TABLET | Freq: Every day | ORAL | 1 refills | Status: DC
  Filled 2022-04-17: qty 90, 90d supply, fill #0
  Filled 2022-08-04: qty 90, 90d supply, fill #1

## 2022-04-17 MED ORDER — CLONAZEPAM 1 MG PO TABS
1.0000 mg | ORAL_TABLET | Freq: Every day | ORAL | 2 refills | Status: DC
  Filled 2022-04-17: qty 30, 30d supply, fill #0
  Filled 2022-05-20: qty 30, 30d supply, fill #1
  Filled 2022-06-22: qty 30, 30d supply, fill #2

## 2022-04-17 NOTE — Progress Notes (Signed)
?  HPI with pertinent ROS:  ? ?CC: 79-monthfollow-up ? ?HPI: ?Pleasant 66year old female presenting today for a 360-monthollow-up.  She has been doing fairly well since her last visit.  She is being followed closely by oncology and receiving urological interventions as needed.  She has an upcoming surgery to replace her stents and also has a CT next week.  She is worried that she has ovarian cancer at this point.  Has noted that she has gained quite a bit of weight since she started Lexapro.  She has been doing well on the Lexapro mood wise and feels that it is doing very well to help control her overall anxiety.  She is very concerned about the weight gain and would like to try something else at this point.  Reports that she took sertraline in the past but this has been many years ago and she cannot remember whether it was significantly helpful or not.  Does note that she is still taking Wellbutrin 300 mg daily as well as Abilify 10 mg daily.  She uses clonazepam 1 mg at night which helps her sleep through the night. ? ?Does note that she continues to drink alcohol on a daily basis.  She prefers beer and drinks approximately 3-4 every day.  She has been told that she should cut back and she knows that this would probably be beneficial but she has not done so yet.  Notes that when she drinks she does not get drunk and there is no alteration in judgment.  Has not noted an interaction between the alcohol and her medications. ? ?I reviewed the past medical history, family history, social history, surgical history, and allergies today and no changes were needed.  Please see the problem list section below in epic for further details. ? ? ?Physical exam:  ? ?General: Well Developed, well nourished, and in no acute distress.  ?Neuro: Alert and oriented x3.  ?HEENT: Normocephalic, atraumatic.  ?Skin: Warm and dry. ?Cardiac: Regular rate and rhythm, no murmurs rubs or gallops, no lower extremity edema.  ?Respiratory: Clear  to auscultation bilaterally. Not using accessory muscles, speaking in full sentences. ? ?Impression and Recommendations:   ? ?1. Anxiety about health ?2. Panic attack ?Switching from Lexapro to sertraline 25 mg daily.  Continue Wellbutrin and Abilify as prescribed.  Refilling clonazepam for use at bedtime. ? ?3. Essential hypertension with goal blood pressure less than 130/80 ?Blood pressure at goal today.  Continue as needed use of amlodipine. ? ?4. Heavy alcohol consumption ?Discussed alcohol use and recommendations for healthy alcohol intake.  Would like for her to drink no more than 1-2 beers per night so recommend cutting back for now.  Patient verbalized understanding and will work on this. ? ?Return for Chronic disease follow-up in 3 to 4 months. ?___________________________________________ ?JoClearnce SorrelDNP, APRN, FNP-BC ?Primary Care and Sports Medicine ?[place]

## 2022-04-21 ENCOUNTER — Ambulatory Visit (HOSPITAL_COMMUNITY)
Admission: RE | Admit: 2022-04-21 | Discharge: 2022-04-21 | Disposition: A | Discharge status: Home / Self Care | Payer: No Typology Code available for payment source | Source: Ambulatory Visit | Attending: Oncology | Admitting: Oncology

## 2022-04-21 ENCOUNTER — Encounter (HOSPITAL_COMMUNITY): Payer: Self-pay

## 2022-04-21 DIAGNOSIS — C669 Malignant neoplasm of unspecified ureter: Secondary | ICD-10-CM | POA: Insufficient documentation

## 2022-04-21 MED ORDER — IOHEXOL 300 MG/ML  SOLN
100.0000 mL | Freq: Once | INTRAMUSCULAR | Status: AC | PRN
  Administered 2022-04-21: 75 mL via INTRAVENOUS

## 2022-04-21 MED ORDER — SODIUM CHLORIDE 0.9 % IV SOLN
INTRAVENOUS | Status: AC
  Filled 2022-04-21: qty 250

## 2022-04-21 MED ORDER — SODIUM CHLORIDE (PF) 0.9 % IJ SOLN
INTRAMUSCULAR | Status: AC
  Filled 2022-04-21: qty 50

## 2022-04-22 ENCOUNTER — Inpatient Hospital Stay (HOSPITAL_BASED_OUTPATIENT_CLINIC_OR_DEPARTMENT_OTHER): Payer: No Typology Code available for payment source | Admitting: Oncology

## 2022-04-22 ENCOUNTER — Inpatient Hospital Stay: Payer: No Typology Code available for payment source | Attending: Oncology

## 2022-04-22 ENCOUNTER — Other Ambulatory Visit: Payer: Self-pay

## 2022-04-22 ENCOUNTER — Other Ambulatory Visit (HOSPITAL_COMMUNITY): Payer: Self-pay

## 2022-04-22 ENCOUNTER — Inpatient Hospital Stay: Payer: No Typology Code available for payment source

## 2022-04-22 ENCOUNTER — Telehealth: Payer: Self-pay | Admitting: *Deleted

## 2022-04-22 VITALS — BP 126/79 | HR 67 | Temp 97.6°F | Resp 18 | Ht 66.0 in | Wt 176.0 lb

## 2022-04-22 DIAGNOSIS — C801 Malignant (primary) neoplasm, unspecified: Secondary | ICD-10-CM | POA: Diagnosis not present

## 2022-04-22 DIAGNOSIS — C772 Secondary and unspecified malignant neoplasm of intra-abdominal lymph nodes: Secondary | ICD-10-CM | POA: Diagnosis not present

## 2022-04-22 DIAGNOSIS — D630 Anemia in neoplastic disease: Secondary | ICD-10-CM | POA: Insufficient documentation

## 2022-04-22 DIAGNOSIS — Z5111 Encounter for antineoplastic chemotherapy: Secondary | ICD-10-CM | POA: Diagnosis not present

## 2022-04-22 DIAGNOSIS — R1031 Right lower quadrant pain: Secondary | ICD-10-CM | POA: Insufficient documentation

## 2022-04-22 DIAGNOSIS — Z452 Encounter for adjustment and management of vascular access device: Secondary | ICD-10-CM | POA: Diagnosis not present

## 2022-04-22 DIAGNOSIS — C669 Malignant neoplasm of unspecified ureter: Secondary | ICD-10-CM | POA: Insufficient documentation

## 2022-04-22 DIAGNOSIS — Z95828 Presence of other vascular implants and grafts: Secondary | ICD-10-CM

## 2022-04-22 DIAGNOSIS — Z79899 Other long term (current) drug therapy: Secondary | ICD-10-CM | POA: Insufficient documentation

## 2022-04-22 LAB — CBC WITH DIFFERENTIAL (CANCER CENTER ONLY)
Abs Immature Granulocytes: 0.04 10*3/uL (ref 0.00–0.07)
Basophils Absolute: 0 10*3/uL (ref 0.0–0.1)
Basophils Relative: 1 %
Eosinophils Absolute: 0.2 10*3/uL (ref 0.0–0.5)
Eosinophils Relative: 3 %
HCT: 27.3 % — ABNORMAL LOW (ref 36.0–46.0)
Hemoglobin: 8.8 g/dL — ABNORMAL LOW (ref 12.0–15.0)
Immature Granulocytes: 1 %
Lymphocytes Relative: 23 %
Lymphs Abs: 1.2 10*3/uL (ref 0.7–4.0)
MCH: 35.9 pg — ABNORMAL HIGH (ref 26.0–34.0)
MCHC: 32.2 g/dL (ref 30.0–36.0)
MCV: 111.4 fL — ABNORMAL HIGH (ref 80.0–100.0)
Monocytes Absolute: 0.8 10*3/uL (ref 0.1–1.0)
Monocytes Relative: 16 %
Neutro Abs: 2.9 10*3/uL (ref 1.7–7.7)
Neutrophils Relative %: 56 %
Platelet Count: 165 10*3/uL (ref 150–400)
RBC: 2.45 MIL/uL — ABNORMAL LOW (ref 3.87–5.11)
RDW: 16.7 % — ABNORMAL HIGH (ref 11.5–15.5)
WBC Count: 5.1 10*3/uL (ref 4.0–10.5)
nRBC: 0 % (ref 0.0–0.2)

## 2022-04-22 LAB — CMP (CANCER CENTER ONLY)
ALT: 9 U/L (ref 0–44)
AST: 13 U/L — ABNORMAL LOW (ref 15–41)
Albumin: 3.9 g/dL (ref 3.5–5.0)
Alkaline Phosphatase: 80 U/L (ref 38–126)
Anion gap: 6 (ref 5–15)
BUN: 23 mg/dL (ref 8–23)
CO2: 26 mmol/L (ref 22–32)
Calcium: 9 mg/dL (ref 8.9–10.3)
Chloride: 103 mmol/L (ref 98–111)
Creatinine: 1.16 mg/dL — ABNORMAL HIGH (ref 0.44–1.00)
GFR, Estimated: 53 mL/min — ABNORMAL LOW (ref >60)
Glucose, Bld: 107 mg/dL — ABNORMAL HIGH (ref 70–99)
Potassium: 4.2 mmol/L (ref 3.5–5.1)
Sodium: 135 mmol/L (ref 135–145)
Total Bilirubin: 0.3 mg/dL (ref 0.3–1.2)
Total Protein: 7.1 g/dL (ref 6.5–8.1)

## 2022-04-22 LAB — SAMPLE TO BLOOD BANK

## 2022-04-22 MED ORDER — ONDANSETRON HCL 4 MG/2ML IJ SOLN
8.0000 mg | Freq: Once | INTRAMUSCULAR | Status: AC
  Administered 2022-04-22: 8 mg via INTRAVENOUS
  Filled 2022-04-22: qty 4

## 2022-04-22 MED ORDER — LEUCOVORIN CALCIUM INJECTION 350 MG
400.0000 mg/m2 | Freq: Once | INTRAVENOUS | Status: AC
  Administered 2022-04-22: 804 mg via INTRAVENOUS
  Filled 2022-04-22: qty 25

## 2022-04-22 MED ORDER — FLUOROURACIL CHEMO INJECTION 2.5 GM/50ML
400.0000 mg/m2 | Freq: Once | INTRAVENOUS | Status: AC
  Administered 2022-04-22: 800 mg via INTRAVENOUS
  Filled 2022-04-22: qty 16

## 2022-04-22 MED ORDER — SODIUM CHLORIDE 0.9 % IV SOLN: Freq: Once | INTRAVENOUS | Status: AC

## 2022-04-22 MED ORDER — SODIUM CHLORIDE 0.9% FLUSH
10.0000 mL | Freq: Once | INTRAVENOUS | Status: AC
  Administered 2022-04-22: 10 mL

## 2022-04-22 MED ORDER — SODIUM CHLORIDE 0.9 % IV SOLN
2490.0000 mg/m2 | INTRAVENOUS | Status: DC
  Administered 2022-04-22: 5000 mg via INTRAVENOUS
  Filled 2022-04-22: qty 100

## 2022-04-22 MED ORDER — HYDROCODONE-ACETAMINOPHEN 5-325 MG PO TABS
1.0000 | ORAL_TABLET | Freq: Four times a day (QID) | ORAL | 0 refills | Status: DC | PRN
  Filled 2022-04-22: qty 30, 8d supply, fill #0

## 2022-04-22 NOTE — Progress Notes (Signed)
Denies anyHematology and Oncology Follow Up Visit ? ?Mary Stark ?[id number] ?[birth year] 66 y.o. ?04/22/2022 9:45 AM ?[name], NP  ? ?[name]: 66 year old woman with bladder cancer diagnosed in November 2021.  She was found to have stage IV adenocarcinoma with pelvic adenopathy. ? ? ?Prior Therapy: ? ?She is status post retroperitoneal lymph node biopsy completed on 10/24/2020 which showed metastatic carcinoma. ? ?She is status post repeat cystoscopy and bilateral ureteral stent exchange as well as resection of a bladder tumor on January 21, 2021.  A final pathology showed an adenocarcinoma. ? ?Chemotherapy utilizing carboplatin and gemcitabine started on November 22, 2020.  She completed 6 cycles of therapy on March 06, 2021. ? ?FOLFOX chemotherapy started on Apr 15, 2021.  She completed 12 cycles of therapy. ? ?She is status post radiation therapy to a right psoas metastatic lesion. She will complete 50 Gray in 5 fractions on February 27, 2022. ? ?Current therapy: 5-FU and leucovorin chemotherapy every 2 weeks started in November 2022.  She is here for the next cycle of therapy. ? ?Interim History: Mary Stark presents today for a follow-up visit.  Since last visit, she reports no major changes in her health.  She denies any chest pain or shortness of breath.  She denies any difficulty breathing.  He still has periodic dizziness but no unsteadiness or weakness.  He denies any hematochezia, melena or hemoptysis.  She denies any changes in her bowels. ? ? ? ? ?Medications: Reviewed without changes. ?Current Outpatient Medications  ?Medication Sig Dispense Refill  ? albuterol (PROAIR HFA) 108 (90 Base) MCG/ACT inhaler INHALE 1 TO 2 PUFFS BY MOUTH INTO THE LUNGS EVERY 4 HOURS AS NEEDED FOR WHEEZING OR SHORTNESS OF BREATH 8 g 1  ? amLODipine (NORVASC) 5 MG tablet Take 5 mg by mouth as needed.    ? ARIPiprazole (ABILIFY) 10 MG tablet TAKE 1 TABLET BY MOUTH ONCE DAILY 90 tablet 0  ? azelastine  (ASTELIN) 0.1 % nasal spray Place 2 sprays into both nostrils 2 (two) times daily. Use in each nostril as directed 301 mL 1  ? buPROPion (WELLBUTRIN XL) 300 MG 24 hr tablet TAKE 1 TABLET BY MOUTH EVERY DAY 30 tablet 3  ? cephALEXin (KEFLEX) 250 MG capsule Take 1 capsule by mouth nightly as directed 90 capsule 1  ? clonazePAM (KLONOPIN) 1 MG tablet Take 1 tablet (1 mg total) by mouth at bedtime. 30 tablet 2  ? cycloSPORINE (RESTASIS) 0.05 % ophthalmic emulsion Place 1 drop both eyes twice a day 180 each 3  ? fluticasone-salmeterol (ADVAIR HFA) 115-21 MCG/ACT inhaler Inhale 2 puffs into the lungs 2 (two) times daily. (Patient taking differently: Inhale 2 puffs into the lungs 2 (two) times daily. Havent had it filled) 1 each 0  ? gabapentin (NEURONTIN) 300 MG capsule Take 1 capsule (300 mg total) by mouth 4 (four) times daily. 360 capsule 1  ? lidocaine-prilocaine (EMLA) cream Apply 1 application topically as needed. 30 g 0  ? Meth-Hyo-M Bl-Na Phos-Ph Sal (URIBEL) 118 MG CAPS Take one capsule by mouth 3 times daily (Patient taking differently: as needed.) 30 capsule 3  ? mirabegron ER (MYRBETRIQ) 25 MG TB24 tablet TAKE 1 TABLET BY MOUTH ONCE A DAY 30 tablet 3  ? Nebivolol HCl 20 MG TABS Take 1/2 tablet (10 mg total) by mouth daily. 90 tablet 1  ? nitrofurantoin, macrocrystal-monohydrate, (MACROBID) 100 MG capsule Take 1 capsule (100 mg total) by mouth 2 (two) times daily. 10 capsule  0  ? omeprazole (PRILOSEC) 20 MG capsule Take 1 capsule (20 mg total) by mouth daily. 90 capsule 3  ? ondansetron (ZOFRAN) 4 MG tablet Take 1 tablet (4 mg total) by mouth every 8 (eight) hours as needed for nausea or vomiting. 40 tablet 3  ? oxybutynin (DITROPAN) 5 MG tablet TAKE 1 TABLET BY MOUTH EVERY 8 HOURS AS NEEDED 90 tablet 3  ? oxymetazoline (AFRIN) 0.05 % nasal spray Place 1 spray into both nostrils 2 (two) times daily as needed for congestion. For nose bleeds    ? Phenazopyridine HCl (AZO URINARY PAIN PO) Take 2 tablets by mouth 3  (three) times daily as needed (bladder spasms).    ? sertraline (ZOLOFT) 25 MG tablet Take 1 tablet (25 mg total) by mouth daily. 90 tablet 1  ? simvastatin (ZOCOR) 20 MG tablet TAKE 1 TABLET (20 MG TOTAL) BY MOUTH DAILY. (Patient taking differently: Take 20 mg by mouth at bedtime.) 90 tablet 3  ? tamsulosin (FLOMAX) 0.4 MG CAPS capsule TAKE 1 CAPSULE BY MOUTH AT BEDTIME 30 capsule 3  ? traMADol (ULTRAM) 50 MG tablet Take 1 tablet (50 mg total) by mouth 3 (three) times daily as needed. 30 tablet 3  ? ?No current facility-administered medications for this visit.  ? ? ? ?Allergies:  ?Allergies  ?Allergen Reactions  ? Metformin And Related Other (See Comments)  ?  Lactic acidosis   ? Penicillins Hives and Rash  ?  Reports hives to penicillin and amoxicillin as an adult "a long time ago." Has tolerated cefdinir (09/2020) and cefepime (10/2020) before.  ? Metformin   ?  [place] WUJW:[id number] ?[place] OZHY:[id number]  ? Penicillin G Sodium   ?  [place] BMWU:[id number] ?[place] ZDGU:[id number] ?[place] GLOV:[id number]  ? ? ? ? ?Physical Exam: ? ? ? ? ?Blood pressure 126/79, pulse 67, temperature 97.6 ?F (36.4 ?C), temperature source Temporal, resp. rate 18, height '5\' 6"'$  (1.676 m), weight 176 lb (79.8 kg), SpO2 98 %. ? ? ? ? ? ? ? ? ? ?ECOG: 1 ? ? ? ? ?General appearance: Alert, awake without any distress. ?Head: Atraumatic without abnormalities ?Oropharynx: Without any thrush or ulcers. ?Eyes: No scleral icterus. ?Lymph nodes: No lymphadenopathy noted in the cervical, supraclavicular, or axillary nodes ?Heart:regular rate and rhythm, without any murmurs or gallops.   ?Lung: Clear to auscultation without any rhonchi, wheezes or dullness to percussion. ?Abdomin: Soft, nontender without any shifting dullness or ascites. ?Musculoskeletal: No clubbing or cyanosis. ?Neurological: No motor or sensory deficits. ?Skin: No rashes or lesions. ? ? ? ? ? ? ? ? ? ? ? ? ? ? ? ? ? ? ? ? ?Lab Results: ?Lab Results  ?Component Value Date  ? WBC 6.6  04/07/2022  ? HGB 9.4 (L) 04/07/2022  ? HCT 28.2 (L) 04/07/2022  ? MCV 107.2 (H) 04/07/2022  ? PLT 142 (L) 04/07/2022  ? ?  Chemistry   ?   ?Component Value Date/Time  ? NA 133 (L) 04/07/2022 0836  ? NA 142 04/26/2018 1333  ? K 4.5 04/07/2022 0836  ? CL 99 04/07/2022 0836  ? CO2 26 04/07/2022 0836  ? BUN 26 (H) 04/07/2022 0836  ? BUN 22 04/26/2018 1333  ? CREATININE 1.40 (H) 04/07/2022 0836  ? CREATININE 1.32 (H) 06/04/2021 0000  ? GLU 87 02/07/2013 0000  ?    ?Component Value Date/Time  ? CALCIUM 9.3 04/07/2022 0836  ? ALKPHOS 87 04/07/2022 0836  ? AST 13 (L) 04/07/2022 0836  ? ALT 9  04/07/2022 0836  ? BILITOT 0.3 04/07/2022 0836  ?  ? ? ?IMPRESSION: ?1. Interval decrease in size of a soft tissue nodule overlying the ?right psoas, consistent with treatment response. ?2. Numerous additional matted appearing retroperitoneal lymph nodes ?are unchanged. ?3. No evidence of new metastatic disease in the chest, abdomen, or ?pelvis. ?4. Incidental note of circumferential wall thickening of the distal ?ascending colon near the hepatic flexure, significantly increased ?compared to prior examination and developing over multiple ?sequential prior examinations. Findings are highly concerning for ?metachronous primary colon malignancy. ?5. Bilateral double-J ureteral stent catheters remain in position ?with formed pigtails in the renal pelves and urinary bladder. ?Unchanged, moderate bilateral hydronephrosis. ?6. Mild scarring of the lungs, in a pattern consistent with sequelae ?of prior COVID pneumonia. ?7. Emphysema. ? ? ?Impression and Plan: ? ? ? ?66 year old woman with: ?  ?1.   Stage IV adenocarcinoma of the bladder cancer in November 2021 with pelvic adenopathy. ? ?She continues to be on maintenance 5-FU leucovorin without any major complications.  CT scan obtained on Apr 21, 2022 showed overall disease stability with increased soft tissue nodule in the right psoas indicating treatment response.  I recommended continuing  treatment at this time without any additional changes.  Different salvage therapy options will be deferred unless he has disease progression. ? ? ?2.  IV access: Port-A-Cath remains in use without any is

## 2022-04-22 NOTE — Telephone Encounter (Signed)
Urgent referral faxed to [facility], [phone].  Patient's demographic information, copy of insurance card, today's office note & CT results from 04/21/22 faxed.  Fax confirmation received. ?

## 2022-04-23 ENCOUNTER — Other Ambulatory Visit: Payer: Self-pay

## 2022-04-23 ENCOUNTER — Encounter (HOSPITAL_BASED_OUTPATIENT_CLINIC_OR_DEPARTMENT_OTHER): Payer: Self-pay | Admitting: Urology

## 2022-04-23 NOTE — Progress Notes (Signed)
Spoke w/ via phone for pre-op interview---pt  ?Lab needs dos---- I stat              ?Lab results------EKG on chart ?COVID test -----patient states asymptomatic no test needed ?Arrive at -------04/28/2022 0800 ?NPO after MN NO Solid Food.  Clear liquids from MN until---0700 04/28/2022 ?Med rec completed ?Medications to take morning of surgery -----neurontin, wellbutrin, Zofran,  ?Diabetic medication -----n/a ?Patient instructed no nail polish to be worn day of surgery ?Patient instructed to bring photo id and insurance card day of surgery ?Patient aware to have Driver (ride ) / caregiver  husband Larey Days   for 24 hours after surgery  ?Patient Special Instructions ----- ?Pre-Op special Istructions -----no vaping 24 hrs before surgery ?Patient verbalized understanding of instructions that were given at this phone interview. ?Patient denies shortness of breath, chest pain, fever, cough at this phone interview.   ? ?Had last chemo on Apr 22, 2022, needs I stat DOS  ?

## 2022-04-24 ENCOUNTER — Inpatient Hospital Stay: Payer: No Typology Code available for payment source

## 2022-04-24 VITALS — BP 138/83 | HR 63 | Temp 98.4°F | Resp 18

## 2022-04-24 DIAGNOSIS — Z5111 Encounter for antineoplastic chemotherapy: Secondary | ICD-10-CM | POA: Diagnosis not present

## 2022-04-24 DIAGNOSIS — C801 Malignant (primary) neoplasm, unspecified: Secondary | ICD-10-CM

## 2022-04-24 DIAGNOSIS — Z95828 Presence of other vascular implants and grafts: Secondary | ICD-10-CM

## 2022-04-24 MED ORDER — SODIUM CHLORIDE 0.9% FLUSH
10.0000 mL | Freq: Once | INTRAVENOUS | Status: AC
  Administered 2022-04-24: 10 mL

## 2022-04-24 MED ORDER — HEPARIN SOD (PORK) LOCK FLUSH 100 UNIT/ML IV SOLN
500.0000 [IU] | Freq: Once | INTRAVENOUS | Status: AC
  Administered 2022-04-24: 500 [IU]

## 2022-04-27 ENCOUNTER — Other Ambulatory Visit (HOSPITAL_COMMUNITY): Payer: Self-pay

## 2022-04-27 MED ORDER — PEG 3350-KCL-NA BICARB-NACL 420 G PO SOLR
ORAL | 0 refills | Status: DC
  Filled 2022-04-27: qty 4000, 1d supply, fill #0

## 2022-04-27 MED ORDER — BISACODYL 5 MG PO TBEC
10.0000 mg | DELAYED_RELEASE_TABLET | Freq: Two times a day (BID) | ORAL | 0 refills | Status: DC
Start: 2022-04-27 — End: 2022-08-18
  Filled 2022-04-27: qty 4, 1d supply, fill #0

## 2022-04-27 MED ORDER — PEG 3350-KCL-NA BICARB-NACL 420 G PO SOLR
ORAL | 0 refills | Status: DC
  Filled 2022-04-27 – 2022-05-06 (×2): qty 4000, 1d supply, fill #0

## 2022-04-27 MED ORDER — BISACODYL 5 MG PO TBEC: DELAYED_RELEASE_TABLET | ORAL | 0 refills | Status: DC

## 2022-04-27 NOTE — H&P (Signed)
CC/HPI: cc: bilateral hydronephrosis, metastatic squamous cell carcinoma  ? ? ?10/09/21: 66 year old woman initially seen for incidental finding of asymptomatic right hydronephrosis which then progressed to bilateral hydronephrosis and underwent diagnostic ureteroscopy x2 the last being 07/05/2020. Biopsies will also ultimately negative for malignancy and we discussed a trial of steroids to see if this was some usual inflammatory process. Repeat CT scan of the abdomen pelvis were performed today to reassess hydronephrosis and inflammation. She then underwent retroperitoneal lymph node biopsy by IR and was found to have metastatic cancer either GU or gyn primary. She continues chemotherapy under the management of Dr. Alen Blew. During her last stent exchange she was noted to have a mass at the left ureteral orifice and biopsy showed squamous cell carcinoma. Patient's last stent exchange June 2022. She has been maintained on daily cephalexin for UTI prophylaxis. Following patient's last exchange she was hospitalized for 4 days for urosepsis.  ? ?01/28/22: Patient is doing wonderfully after her last stent exchange and has had very minimal voiding complaints. She would like to see if she can keep her current stents for a longer period of time.  ? ?04/15/2022: 66 year old woman with the above-noted medical history who presents for preoperative appointment prior to undergoing stent exchange. She is currently doing well. She denies fevers, chills, urinary tract infection. Denies chest pain, shortness of breath. Denies recent changes or medication.  ? ?  ?ALLERGIES: Metformin ?penicillin ?  ? ?MEDICATIONS: Cephalexin 250 mg capsule 1 tablet PO Daily take one tablet nightly  ?Myrbetriq 25 mg tablet, extended release 24 hr TAKE 1 TABLET BY MOUTH ONCE A DAY  ?Oxybutynin Chloride 5 mg tablet TAKE 1 TABLET BY MOUTH EVERY 8 HOURS AS NEEDED  ?Tamsulosin Hcl 0.4 mg capsule TAKE 1 CAPSULE BY MOUTH AT BEDTIME  ?Abilify  ?Bystolic   ?Klonopin  ?Lexapro 5 mg tablet  ?Neurontin  ?Wellbutrin Sr  ?  ? ?GU PSH: Cysto Uretero Biopsy Fulgura, Bilateral - 07/05/2020, Right - 06/11/2020 ?Cystoscopy Insert Stent - 10/28/2021, Bilateral - 05/20/2021, Bilateral - 01/21/2021, Bilateral - 07/05/2020, Bilateral - 06/11/2020 ?Cystoscopy TURBT <2 cm - 01/21/2021 ?Locm 300-'399Mg'$ /Ml Iodine,1Ml - 09/09/2020, 05/02/2020 ? ?  ? ?NON-GU PSH: Hand/finger Surgery ?Tonsillectomy ? ?  ? ?GU PMH: Hydronephrosis - 01/28/2022, - 10/09/2021, - 06/06/2021, - 04/14/2021, - 02/18/2021, CT reveals worsening left hydro and enhancing material in right renal pelvis concerning for malignancy. Overall she also has increasing lymphadenopathy in retroperitoneum as well as edema in sacral/peri-rectal regions. The patient recently completed steroids with taper and also had COVID still on O2. We discussed the findings and lack of certainty if this is malignancy vs inflammatory. Will see if IR can biopsy retroperitoneal LAD. Called patient and discussed this In detail. Stents to remain in place for now. , - 09/09/2020, Based on noncontrast CT the abdomen pelvis is difficult to further evaluate ureters. We discussed obtaining a BMP to look at renal function and proceeding with a CT urogram. If hydronephrosis has resolved then no further investigating will be done however if hydronephrosis is still present we did discuss proceeding with cystoscopy, retrograde pyelogram and diagnostic ureteroscopy. 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  ? ?NON-GU PMH: Metastatic lymphadenopathy of multiple regions (Stable) - 10/09/2021, - 04/14/2021 ?Anxiety ?Arthritis ?Depression ?Encounter for general adult medical examination without abnormal findings, Encounter for preventive health examination ?Hepatitis C ?Hypercholesterolemia ?Hypertension ?  ? ?FAMILY  HISTORY: Kidney Stones -  Grandfather ?Protein S Deficiency - Brother  ? ?SOCIAL HISTORY: Marital Status: Married ?Preferred Language: [place]; Ethnicity: Not Hispanic Or Latino; Race: White ?Current Smoking Status: Patient does not smoke anymore.  ? ?Tobacco Use Assessment Completed: Used Tobacco in last 30 days? ?Social Drinker.  ?Drinks 4+ caffeinated drinks per day. ?  ? ?REVIEW OF SYSTEMS:    ?GU Review Female:   Patient reports frequent urination. Patient denies hard to postpone urination, burning /pain with urination, get up at night to urinate, leakage of urine, stream starts and stops, trouble starting your stream, have to strain to urinate, and being pregnant.  ?Gastrointestinal (Upper):   Patient denies nausea, vomiting, and indigestion/ heartburn.  ?Gastrointestinal (Lower):   Patient denies diarrhea and constipation.  ?Constitutional:   Patient reports fatigue. Patient denies fever, night sweats, and weight loss.  ?Skin:   Patient denies skin rash/ lesion and itching.  ?Ears/ Nose/ Throat:   Patient denies sore throat and sinus problems.  ?Musculoskeletal:   Patient denies back pain and joint pain.  ?Neurological:   Patient denies headaches and dizziness.  ?Psychologic:   Patient denies anxiety and depression.  ? ?VITAL SIGNS:    ?  04/15/2022 11:05 AM  ?Weight 170 lb / 77.11 kg  ?Height 66 in / 167.64 cm  ?BP 92/60 mmHg  ?Pulse 64 /min  ?Temperature 96.4 F / 35.7 C  ?BMI 27.4 kg/m?  ? ?MULTI-SYSTEM PHYSICAL EXAMINATION:    ?Constitutional: Well-nourished. No physical deformities. Normally developed. Good grooming.  ?Neck: Neck symmetrical, not swollen. Normal tracheal position.  ?Respiratory: No labored breathing, no use of accessory muscles.   ?Cardiovascular: Normal temperature, normal extremity pulses, no swelling, no varicosities.  ?Lymphatic: No enlargement of neck, axillae, groin.  ?Skin: No paleness, no jaundice, no cyanosis. No lesion, no ulcer, no rash.  ?Neurologic / Psychiatric: Oriented to time, oriented to place,  oriented to person. No depression, no anxiety, no agitation.  ?Gastrointestinal: No mass, no tenderness, no rigidity, non obese abdomen.  ?Eyes: Normal conjunctivae. Normal eyelids.  ?Ears, Nose, Mouth, and Throat: Left ear no scars, no lesions, no masses. Right ear no scars, no lesions, no masses. Nose no scars, no lesions, no masses. Normal hearing. Normal lips.  ?Musculoskeletal: Normal gait and station of head and neck.  ? ?  ?Complexity of Data:  ?Source Of History:  Patient  ?Records Review:   Previous Doctor Records, Previous Patient Records  ? ?PROCEDURES: None  ? ?ASSESSMENT:  ?    ICD-10 Details  ?1 GU:   Hydronephrosis - N13.0 Chronic, Stable  ? ?PLAN:    ? ?      Document ?Letter(s):  Created for Patient: Clinical Summary  ? ? ?     Notes:   She will notify the office with any changes to her medications, allergies or health history prior to her upcoming surgical appointment. All of her questions were answered to the best of my ability. She will keep her surgery as scheduled on 04/28/2022. She will then need to be scheduled for another 12-monthbilateral stent exchange.  ? ? ?

## 2022-04-28 ENCOUNTER — Encounter (HOSPITAL_BASED_OUTPATIENT_CLINIC_OR_DEPARTMENT_OTHER)
Admission: RE | Disposition: A | Discharge status: Home / Self Care | Payer: Self-pay | Source: Home / Self Care | Attending: Urology

## 2022-04-28 ENCOUNTER — Ambulatory Visit (HOSPITAL_BASED_OUTPATIENT_CLINIC_OR_DEPARTMENT_OTHER)
Admission: RE | Admit: 2022-04-28 | Discharge: 2022-04-28 | Disposition: A | Discharge status: Home / Self Care | Payer: No Typology Code available for payment source | Attending: Urology | Admitting: Urology

## 2022-04-28 ENCOUNTER — Encounter (HOSPITAL_BASED_OUTPATIENT_CLINIC_OR_DEPARTMENT_OTHER): Payer: Self-pay | Admitting: Urology

## 2022-04-28 ENCOUNTER — Ambulatory Visit (HOSPITAL_BASED_OUTPATIENT_CLINIC_OR_DEPARTMENT_OTHER): Payer: No Typology Code available for payment source | Admitting: Anesthesiology

## 2022-04-28 ENCOUNTER — Other Ambulatory Visit (HOSPITAL_COMMUNITY): Payer: Self-pay

## 2022-04-28 DIAGNOSIS — D630 Anemia in neoplastic disease: Secondary | ICD-10-CM | POA: Diagnosis not present

## 2022-04-28 DIAGNOSIS — I10 Essential (primary) hypertension: Secondary | ICD-10-CM | POA: Insufficient documentation

## 2022-04-28 DIAGNOSIS — N1339 Other hydronephrosis: Secondary | ICD-10-CM | POA: Diagnosis present

## 2022-04-28 DIAGNOSIS — C801 Malignant (primary) neoplasm, unspecified: Secondary | ICD-10-CM | POA: Diagnosis not present

## 2022-04-28 DIAGNOSIS — C779 Secondary and unspecified malignant neoplasm of lymph node, unspecified: Secondary | ICD-10-CM

## 2022-04-28 DIAGNOSIS — K219 Gastro-esophageal reflux disease without esophagitis: Secondary | ICD-10-CM | POA: Diagnosis not present

## 2022-04-28 DIAGNOSIS — C7911 Secondary malignant neoplasm of bladder: Secondary | ICD-10-CM | POA: Insufficient documentation

## 2022-04-28 DIAGNOSIS — F418 Other specified anxiety disorders: Secondary | ICD-10-CM | POA: Diagnosis not present

## 2022-04-28 DIAGNOSIS — C679 Malignant neoplasm of bladder, unspecified: Secondary | ICD-10-CM

## 2022-04-28 DIAGNOSIS — F32A Depression, unspecified: Secondary | ICD-10-CM | POA: Insufficient documentation

## 2022-04-28 DIAGNOSIS — Z87891 Personal history of nicotine dependence: Secondary | ICD-10-CM | POA: Diagnosis not present

## 2022-04-28 DIAGNOSIS — F419 Anxiety disorder, unspecified: Secondary | ICD-10-CM | POA: Insufficient documentation

## 2022-04-28 DIAGNOSIS — Z79899 Other long term (current) drug therapy: Secondary | ICD-10-CM | POA: Diagnosis not present

## 2022-04-28 HISTORY — PX: CYSTOSCOPY W/ URETERAL STENT PLACEMENT: SHX1429

## 2022-04-28 LAB — POCT I-STAT, CHEM 8
BUN: 29 mg/dL — ABNORMAL HIGH (ref 8–23)
Calcium, Ion: 1.2 mmol/L (ref 1.15–1.40)
Chloride: 100 mmol/L (ref 98–111)
Creatinine, Ser: 1.4 mg/dL — ABNORMAL HIGH (ref 0.44–1.00)
Glucose, Bld: 106 mg/dL — ABNORMAL HIGH (ref 70–99)
HCT: 31 % — ABNORMAL LOW (ref 36.0–46.0)
Hemoglobin: 10.5 g/dL — ABNORMAL LOW (ref 12.0–15.0)
Potassium: 4.4 mmol/L (ref 3.5–5.1)
Sodium: 135 mmol/L (ref 135–145)
TCO2: 24 mmol/L (ref 22–32)

## 2022-04-28 SURGERY — CYSTOSCOPY, WITH RETROGRADE PYELOGRAM AND URETERAL STENT INSERTION
Anesthesia: General | Site: Bladder | Laterality: Bilateral

## 2022-04-28 MED ORDER — ONDANSETRON HCL 4 MG/2ML IJ SOLN
INTRAMUSCULAR | Status: DC | PRN
Start: 2022-04-28 — End: 2022-04-28
  Administered 2022-04-28: 4 mg via INTRAVENOUS

## 2022-04-28 MED ORDER — STERILE WATER FOR IRRIGATION IR SOLN
Status: DC | PRN
  Administered 2022-04-28: 3000 mL

## 2022-04-28 MED ORDER — CEFAZOLIN SODIUM-DEXTROSE 2-4 GM/100ML-% IV SOLN
INTRAVENOUS | Status: AC
  Filled 2022-04-28: qty 100

## 2022-04-28 MED ORDER — LIDOCAINE 2% (20 MG/ML) 5 ML SYRINGE
INTRAMUSCULAR | Status: DC | PRN
  Administered 2022-04-28: 60 mg via INTRAVENOUS

## 2022-04-28 MED ORDER — DEXAMETHASONE SODIUM PHOSPHATE 10 MG/ML IJ SOLN
INTRAMUSCULAR | Status: AC
  Filled 2022-04-28: qty 1

## 2022-04-28 MED ORDER — AMISULPRIDE (ANTIEMETIC) 5 MG/2ML IV SOLN: 10.0000 mg | Freq: Once | INTRAVENOUS | Status: DC | PRN

## 2022-04-28 MED ORDER — LIDOCAINE HCL (PF) 2 % IJ SOLN
INTRAMUSCULAR | Status: AC
  Filled 2022-04-28: qty 5

## 2022-04-28 MED ORDER — CEFAZOLIN SODIUM-DEXTROSE 2-4 GM/100ML-% IV SOLN
2.0000 g | INTRAVENOUS | Status: AC
  Administered 2022-04-28: 2 g via INTRAVENOUS

## 2022-04-28 MED ORDER — DEXAMETHASONE SODIUM PHOSPHATE 10 MG/ML IJ SOLN
INTRAMUSCULAR | Status: DC | PRN
Start: 2022-04-28 — End: 2022-04-28
  Administered 2022-04-28: 10 mg via INTRAVENOUS

## 2022-04-28 MED ORDER — FENTANYL CITRATE (PF) 100 MCG/2ML IJ SOLN: 25.0000 ug | INTRAMUSCULAR | Status: DC | PRN

## 2022-04-28 MED ORDER — MIDAZOLAM HCL 2 MG/2ML IJ SOLN
INTRAMUSCULAR | Status: AC
  Filled 2022-04-28: qty 2

## 2022-04-28 MED ORDER — KETOROLAC TROMETHAMINE 15 MG/ML IJ SOLN: 15.0000 mg | Freq: Once | INTRAMUSCULAR | Status: DC | PRN

## 2022-04-28 MED ORDER — MIDAZOLAM HCL 5 MG/5ML IJ SOLN
INTRAMUSCULAR | Status: DC | PRN
  Administered 2022-04-28: 2 mg via INTRAVENOUS

## 2022-04-28 MED ORDER — ACETAMINOPHEN 10 MG/ML IV SOLN: 1000.0000 mg | Freq: Once | INTRAVENOUS | Status: DC | PRN

## 2022-04-28 MED ORDER — PROPOFOL 10 MG/ML IV BOLUS
INTRAVENOUS | Status: DC | PRN
  Administered 2022-04-28: 200 mg via INTRAVENOUS

## 2022-04-28 MED ORDER — ONDANSETRON HCL 4 MG/2ML IJ SOLN
INTRAMUSCULAR | Status: AC
  Filled 2022-04-28: qty 2

## 2022-04-28 MED ORDER — SODIUM CHLORIDE 0.9 % IV SOLN
INTRAVENOUS | Status: DC
Start: 2022-04-28 — End: 2022-04-28

## 2022-04-28 MED ORDER — PROPOFOL 10 MG/ML IV BOLUS
INTRAVENOUS | Status: AC
  Filled 2022-04-28: qty 20

## 2022-04-28 MED ORDER — FENTANYL CITRATE (PF) 100 MCG/2ML IJ SOLN
INTRAMUSCULAR | Status: AC
Start: 2022-04-28 — End: ?
  Filled 2022-04-28: qty 2

## 2022-04-28 MED ORDER — ONDANSETRON HCL 4 MG/2ML IJ SOLN: 4.0000 mg | Freq: Once | INTRAMUSCULAR | Status: DC | PRN

## 2022-04-28 MED ORDER — LACTATED RINGERS IV SOLN: INTRAVENOUS | Status: DC

## 2022-04-28 SURGICAL SUPPLY — 20 items
BAG DRAIN URO-CYSTO SKYTR STRL (DRAIN) ×2 IMPLANT
BAG DRN UROCATH (DRAIN) ×1
BASKET ZERO TIP NITINOL 2.4FR (BASKET) IMPLANT
BSKT STON RTRVL ZERO TP 2.4FR (BASKET)
CATH URET 5FR 28IN OPEN ENDED (CATHETERS) ×2 IMPLANT
CLOTH BEACON ORANGE TIMEOUT ST (SAFETY) ×2 IMPLANT
DRSG TEGADERM 4X4.75 (GAUZE/BANDAGES/DRESSINGS) IMPLANT
EXTRACTOR STONE 1.7FRX115CM (UROLOGICAL SUPPLIES) IMPLANT
GLOVE BIO SURGEON STRL SZ 6.5 (GLOVE) ×2 IMPLANT
GOWN STRL REUS W/TWL LRG LVL3 (GOWN DISPOSABLE) ×2 IMPLANT
GUIDEWIRE STR DUAL SENSOR (WIRE) ×2 IMPLANT
IV NS IRRIG 3000ML ARTHROMATIC (IV SOLUTION) ×2 IMPLANT
KIT TURNOVER CYSTO (KITS) ×2 IMPLANT
MANIFOLD NEPTUNE II (INSTRUMENTS) ×2 IMPLANT
PACK CYSTO (CUSTOM PROCEDURE TRAY) ×2 IMPLANT
STENT URET 6FRX24 CONTOUR (STENTS) ×2 IMPLANT
TRACTIP FLEXIVA PULS ID 200XHI (Laser) IMPLANT
TRACTIP FLEXIVA PULSE ID 200 (Laser)
TUBE CONNECTING 12X1/4 (SUCTIONS) ×2 IMPLANT
TUBING UROLOGY SET (TUBING) ×2 IMPLANT

## 2022-04-28 NOTE — Op Note (Signed)
PATIENT:  Mary Stark ? ?Preoperative diagnosis:  ?Malignant bilateral hydronephrosis ? ?Postoperative diagnosis:  ?Malignant bilateral hydronephrosis ? ?Procedure: ? ?Cystoscopy ?bilateral ureteral stent exchange (6Fr x 24cm) -no tether ? ?Surgeon: Jacalyn Lefevre, MD ? ?Anesthesia: General ? ?Complications: None ? ?EBL: Minimal ? ?Specimens: None ? ?Findings: ?1.  Normal urethra ?2.  No evidence of bladder masses, stones or abnormal mucosa; well-healed prior TUR scar adjacent to left ureteral orifice ?3.  Bilateral 6 [place] by 24 cm ureteral stents exchange ?4.  No resistance met on left side during stent exchange, mild resistance on right-hand side ? ?Indication: 66 year old woman with a history of metastatic adenocarcinoma resulting in bilateral malignant hydronephrosis that is managed with chronic indwelling ureteral stents. ? ?Description of procedure: ? ?The patient was taken to the operating room and general anesthesia was induced.  The patient was placed in the dorsal lithotomy position, prepped and draped in the usual sterile fashion, and preoperative antibiotics were administered. A preoperative time-out was performed.  ? ?Cystourethroscopy was performed.  The patient?s urethra was examined and was normal. The bladder was then systematically examined in its entirety. There was no evidence for any bladder tumors, stones, or other mucosal pathology.   ? ?Attention then turned to the left ureteral orifice and graspers were used to bring the existing ureteral stent to the urethral meatus.  A 0.38 sensor wire was then advanced through the ureteral stent and the ureteral stent was discarded.  The stent was examined and deemed to be intact.  Next a 6 [place] by 24 cm double-J ureteral stent was advanced over the wire and advanced to the kidney with fluoroscopic guidance.  The wire was removed and proximal curl seen in the kidney with fluoroscopy and a distal curl seen in the bladder under direct visualization.   Attention then turned to the right-hand side where the same procedure was repeated.   ? ? ?The bladder was then emptied and the procedure ended.  The patient appeared to tolerate the procedure well and without complications.  The patient was able to be awakened and transferred to the recovery unit in satisfactory condition.   ?

## 2022-04-28 NOTE — Discharge Instructions (Addendum)
Post stent exchange instructions ? ? ?Definitions: ? ?Ureter: The duct that transports urine from the kidney to the bladder. ?Stent: A plastic hollow tube that is placed into the ureter, from the kidney to the bladder to prevent the ureter from swelling shut. ? ?General instructions: ? ?Despite the fact that no skin incisions were used, the area around the ureter and bladder is raw and irritated. The stent is a foreign body which can further irritate the bladder wall. This irritation is manifested by increased frequency of urination, both day and night, and by an increase in the urge to urinate. In some, the urge to urinate is present almost always. Sometimes the urge is strong enough that you may not be able to stop your self from urinating. This can often be controlled with medication but does not occur in everyone. A stent can safely be left in place for 3 months or greater. ? ?You may see some blood in your urine while the stent is in place and a few days afterward. Do not be alarmed, even if the urine is clear for a while. Get off your feet and drink lots of fluids until clearing occurs. If you start to pass clots or don't improve, call us. ? ?Diet: ? ?You may return to your normal diet immediately. Because of the raw surface of your bladder, alcohol, spicy foods, foods high in acid and drinks with caffeine may cause irritation or frequency and should be used in moderation. To keep your urine flowing freely and avoid constipation, drink plenty of fluids during the day (8-10 glasses). Tip: Avoid cranberry juice because it is very acidic. ? ?Activity: ? ?Your physical activity doesn't need to be restricted. However, if you are very active, you may see some blood in the urine. We suggest that you reduce your activity under the circumstances until the bleeding has stopped. ? ?Bowels: ? ?It is important to keep your bowels regular during the postoperative period. Straining with bowel movements can cause bleeding. A  bowel movement every other day is reasonable. Use a mild laxative if needed, such as milk of magnesia 2-3 tablespoons, or 2 Dulcolax tablets. Call if you continue to have problems. If you had been taking narcotics for pain, before, during or after your surgery, you may be constipated. Take a laxative if necessary. ? ?Medication: ? ?You should resume your pre-surgery medications unless told not to. In addition you may be given an antibiotic to prevent or treat infection. Antibiotics are not always necessary. All medication should be taken as prescribed until the bottles are finished unless you are having an unusual reaction to one of the drugs. ? ?Problems you should report to [place]: ? ?a. Fever greater than 101?F. ?b. Heavy bleeding, or clots (see notes above about blood in urine). ?c. Inability to urinate. ?d. Drug reactions (hives, rash, nausea, vomiting, diarrhea). ?e. Severe burning or pain with urination that is not improving. ? ? ?Post Anesthesia Home Care Instructions ? ?Activity: ?Get plenty of rest for the remainder of the day. A responsible individual must stay with you for 24 hours following the procedure.  ?For the next 24 hours, DO NOT: ?-Drive a car ?-Paediatric nurse ?-Drink alcoholic beverages ?-Take any medication unless instructed by your physician ?-Make any legal decisions or sign important papers. ? ?Meals: ?Start with liquid foods such as gelatin or soup. Progress to regular foods as tolerated. Avoid greasy, spicy, heavy foods. If nausea and/or vomiting occur, drink only clear liquids until the nausea  and/or vomiting subsides. Call your physician if vomiting continues. ? ?Special Instructions/Symptoms: ?Your throat may feel dry or sore from the anesthesia or the breathing tube placed in your throat during surgery. If this causes discomfort, gargle with warm salt water. The discomfort should disappear within 24 hours. ? ?If you had a scopolamine patch placed behind your ear for the management of  post- operative nausea and/or vomiting: ? ?1. The medication in the patch is effective for 72 hours, after which it should be removed.  Wrap patch in a tissue and discard in the trash. Wash hands thoroughly with soap and water. ?2. You may remove the patch earlier than 72 hours if you experience unpleasant side effects which may include dry mouth, dizziness or visual disturbances. ?3. Avoid touching the patch. Wash your hands with soap and water after contact with the patch. ?    ?

## 2022-04-28 NOTE — Transfer of Care (Signed)
Immediate Anesthesia Transfer of Care Note ? ?Patient: Mary Stark ? ?Procedure(s) Performed: Consuela Mimes Wyvonnia Dusky STENT EXCHANGE (Bilateral: Bladder) ? ?Patient Location: PACU ? ?Anesthesia Type:General ? ?Level of Consciousness: drowsy and patient cooperative ? ?Airway & Oxygen Therapy: Patient Spontanous Breathing ? ?Post-op Assessment: Report given to RN and Post -op Vital signs reviewed and stable ? ?Post vital signs: Reviewed and stable ? ?Last Vitals:  ?Vitals Value Taken Time  ?BP 115/66 04/28/22 1023  ?Temp    ?Pulse 63 04/28/22 1025  ?Resp 23 04/28/22 1024  ?SpO2 95 % 04/28/22 1025  ?Vitals shown include unvalidated device data. ? ?Last Pain:  ?Vitals:  ? 04/28/22 0825  ?TempSrc: Oral  ?PainSc: 2   ?   ? ?Patients Stated Pain Goal: 3 (04/28/22 0825) ? ?Complications: No notable events documented. ?

## 2022-04-28 NOTE — Anesthesia Postprocedure Evaluation (Signed)
Anesthesia Post Note ? ?Patient: Mary Stark ? ?Procedure(s) Performed: Consuela Mimes Wyvonnia Dusky STENT EXCHANGE (Bilateral: Bladder) ? ?  ? ?Patient location during evaluation: PACU ?Anesthesia Type: General ?Level of consciousness: awake ?Pain management: pain level controlled ?Vital Signs Assessment: post-procedure vital signs reviewed and stable ?Respiratory status: spontaneous breathing, nonlabored ventilation, respiratory function stable and patient connected to nasal cannula oxygen ?Cardiovascular status: blood pressure returned to baseline and stable ?Postop Assessment: no apparent nausea or vomiting ?Anesthetic complications: no ? ? ?No notable events documented. ? ?Last Vitals:  ?Vitals:  ? 04/28/22 1045 04/28/22 1110  ?BP: 137/82 (!) 134/91  ?Pulse: 63 62  ?Resp: 20 16  ?Temp:  36.7 ?C  ?SpO2: 99% 97%  ?  ?Last Pain:  ?Vitals:  ? 04/28/22 1110  ?TempSrc:   ?PainSc: 2   ? ? ?  ?  ?  ?  ?  ?  ? ?Ryelynn Guedea P Arcadio Cope ? ? ? ? ?

## 2022-04-28 NOTE — Anesthesia Procedure Notes (Signed)
Procedure Name: LMA Insertion ?Date/Time: 04/28/2022 10:01 AM ?Performed by: [facility] Blocker, CRNA ?Pre-anesthesia Checklist: Patient identified, Emergency Drugs available, Suction available and Patient being monitored ?Patient Re-evaluated:Patient Re-evaluated prior to induction ?Oxygen Delivery Method: Circle System Utilized ?Preoxygenation: Pre-oxygenation with 100% oxygen ?Induction Type: IV induction ?Ventilation: Mask ventilation without difficulty ?LMA: LMA inserted ?LMA Size: 4.0 ?Number of attempts: 1 ?Placement Confirmation: positive ETCO2 ?Tube secured with: Tape ?Dental Injury: Teeth and Oropharynx as per pre-operative assessment  ? ? ? ? ?

## 2022-04-28 NOTE — Interval H&P Note (Signed)
History and Physical Interval Note: ? ?04/28/2022 ?9:32 AM ? ?Mary Stark  has presented today for surgery, with the diagnosis of BILATERAL MALIGNANT HYDRONEPHROSIS.  The various methods of treatment have been discussed with the patient and family. After consideration of risks, benefits and other options for treatment, the patient has consented to  Procedure(s) with comments: ?[place] (Bilateral) - 1 HR as a surgical intervention.  The patient's history has been reviewed, patient examined, no change in status, stable for surgery.  I have reviewed the patient's chart and labs.  Questions were answered to the patient's satisfaction.   ? ? ?[name] D [name] ? ? ?

## 2022-04-28 NOTE — Anesthesia Preprocedure Evaluation (Addendum)
Anesthesia Evaluation  ?Patient identified by MRN, date of birth, ID band ?Patient awake ? ? ? ?Reviewed: ?Allergy & Precautions, NPO status , Patient's Chart, lab work & pertinent test results ? ?Airway ?Mallampati: III ? ?TM Distance: >3 FB ?Neck ROM: Full ? ? ? Dental ?no notable dental hx. ? ?  ?Pulmonary ?Patient abstained from smoking., former smoker,  ?  ?Pulmonary exam normal ? ? ? ? ? ? ? Cardiovascular ?hypertension, Pt. on medications ?Normal cardiovascular exam ? ? ?  ?Neuro/Psych ?PSYCHIATRIC DISORDERS Anxiety Depression  Neuromuscular disease   ? GI/Hepatic ?GERD  Medicated and Controlled,(+) Hepatitis -  ?Endo/Other  ?negative endocrine ROS ? Renal/GU ?Renal diseaseAdenocarcinoma of bladder, stage 4   ? ?  ?Musculoskeletal ? ?(+) Arthritis ,  ? Abdominal ?  ?Peds ? Hematology ? ?(+) Blood dyscrasia, anemia ,   ?Anesthesia Other Findings ?BILATERAL MALIGNANT HYDRONEPHROSIS ? Reproductive/Obstetrics ? ?  ? ? ? ? ? ? ? ? ? ? ? ? ? ?  ?  ? ? ? ? ? ? ? ?Anesthesia Physical ?Anesthesia Plan ? ?ASA: 3 ? ?Anesthesia Plan: General  ? ?Post-op Pain Management:   ? ?Induction: Intravenous ? ?PONV Risk Score and Plan: 3 and Ondansetron, Dexamethasone, Midazolam and Treatment may vary due to age or medical condition ? ?Airway Management Planned: LMA ? ?Additional Equipment:  ? ?Intra-op Plan:  ? ?Post-operative Plan: Extubation in OR ? ?Informed Consent: I have reviewed the patients History and Physical, chart, labs and discussed the procedure including the risks, benefits and alternatives for the proposed anesthesia with the patient or authorized representative who has indicated his/her understanding and acceptance.  ? ? ? ?Dental advisory given ? ?Plan Discussed with: CRNA ? ?Anesthesia Plan Comments:   ? ? ? ? ? ?Anesthesia Quick Evaluation ? ?

## 2022-04-29 ENCOUNTER — Encounter (HOSPITAL_BASED_OUTPATIENT_CLINIC_OR_DEPARTMENT_OTHER): Payer: Self-pay | Admitting: Urology

## 2022-05-01 ENCOUNTER — Telehealth: Payer: Self-pay | Admitting: *Deleted

## 2022-05-01 NOTE — Telephone Encounter (Signed)
Chart opened in error

## 2022-05-01 NOTE — Telephone Encounter (Signed)
Returned PC to patient, informed her of Dr. Hazeline Junker recommendation - take Maalox along with her prilosec, this should help with the indigestion & nausea.  Instructed patient to call this office back if this is not helping, she verbalizes understanding.

## 2022-05-01 NOTE — Telephone Encounter (Signed)
-----   Message from Wyatt Portela, MD sent at 05/01/2022 11:54 AM EDT ----- Regarding: RE: Pain & indigestion She can use Prilosec OTC in addition to Maalox as needed. ----- Message ----- From: Rolene Course, RN Sent: 05/01/2022  11:24 AM EDT To: Wyatt Portela, MD Subject: Pain & indigestion                             Mary Stark called & said she is having severe indigestion & nausea (states her zofran is not helping).  She is also having lower abdominal pain that is radiating to her chest, back & R shoulder - she says her pain medication is helping some. She had a stent exchange on 5/15 but says these symptoms were already present before the procedure.  The thing bothering her the most is the indigestion.  Do you have any recommendations?  Thanks, Bethena Roys

## 2022-05-06 ENCOUNTER — Other Ambulatory Visit: Payer: Self-pay

## 2022-05-06 ENCOUNTER — Inpatient Hospital Stay: Payer: No Typology Code available for payment source

## 2022-05-06 ENCOUNTER — Other Ambulatory Visit (HOSPITAL_COMMUNITY): Payer: Self-pay

## 2022-05-06 ENCOUNTER — Inpatient Hospital Stay (HOSPITAL_BASED_OUTPATIENT_CLINIC_OR_DEPARTMENT_OTHER): Payer: No Typology Code available for payment source | Admitting: Oncology

## 2022-05-06 VITALS — BP 119/79 | HR 72 | Temp 97.3°F | Resp 16 | Wt 173.3 lb

## 2022-05-06 DIAGNOSIS — Z95828 Presence of other vascular implants and grafts: Secondary | ICD-10-CM

## 2022-05-06 DIAGNOSIS — Z5111 Encounter for antineoplastic chemotherapy: Secondary | ICD-10-CM | POA: Diagnosis not present

## 2022-05-06 DIAGNOSIS — C801 Malignant (primary) neoplasm, unspecified: Secondary | ICD-10-CM

## 2022-05-06 DIAGNOSIS — C669 Malignant neoplasm of unspecified ureter: Secondary | ICD-10-CM

## 2022-05-06 LAB — CBC WITH DIFFERENTIAL (CANCER CENTER ONLY)
Abs Immature Granulocytes: 0.02 10*3/uL (ref 0.00–0.07)
Basophils Absolute: 0 10*3/uL (ref 0.0–0.1)
Basophils Relative: 0 %
Eosinophils Absolute: 0.2 10*3/uL (ref 0.0–0.5)
Eosinophils Relative: 3 %
HCT: 27.6 % — ABNORMAL LOW (ref 36.0–46.0)
Hemoglobin: 9.1 g/dL — ABNORMAL LOW (ref 12.0–15.0)
Immature Granulocytes: 0 %
Lymphocytes Relative: 22 %
Lymphs Abs: 1.3 10*3/uL (ref 0.7–4.0)
MCH: 35.8 pg — ABNORMAL HIGH (ref 26.0–34.0)
MCHC: 33 g/dL (ref 30.0–36.0)
MCV: 108.7 fL — ABNORMAL HIGH (ref 80.0–100.0)
Monocytes Absolute: 0.8 10*3/uL (ref 0.1–1.0)
Monocytes Relative: 14 %
Neutro Abs: 3.5 10*3/uL (ref 1.7–7.7)
Neutrophils Relative %: 61 %
Platelet Count: 145 10*3/uL — ABNORMAL LOW (ref 150–400)
RBC: 2.54 MIL/uL — ABNORMAL LOW (ref 3.87–5.11)
RDW: 16.1 % — ABNORMAL HIGH (ref 11.5–15.5)
WBC Count: 5.8 10*3/uL (ref 4.0–10.5)
nRBC: 0 % (ref 0.0–0.2)

## 2022-05-06 LAB — CMP (CANCER CENTER ONLY)
ALT: 7 U/L (ref 0–44)
AST: 12 U/L — ABNORMAL LOW (ref 15–41)
Albumin: 4 g/dL (ref 3.5–5.0)
Alkaline Phosphatase: 88 U/L (ref 38–126)
Anion gap: 9 (ref 5–15)
BUN: 22 mg/dL (ref 8–23)
CO2: 25 mmol/L (ref 22–32)
Calcium: 9.1 mg/dL (ref 8.9–10.3)
Chloride: 100 mmol/L (ref 98–111)
Creatinine: 1.4 mg/dL — ABNORMAL HIGH (ref 0.44–1.00)
GFR, Estimated: 42 mL/min — ABNORMAL LOW (ref >60)
Glucose, Bld: 105 mg/dL — ABNORMAL HIGH (ref 70–99)
Potassium: 4.7 mmol/L (ref 3.5–5.1)
Sodium: 134 mmol/L — ABNORMAL LOW (ref 135–145)
Total Bilirubin: 0.4 mg/dL (ref 0.3–1.2)
Total Protein: 7.2 g/dL (ref 6.5–8.1)

## 2022-05-06 LAB — SAMPLE TO BLOOD BANK

## 2022-05-06 MED ORDER — ONDANSETRON HCL 4 MG/2ML IJ SOLN
8.0000 mg | Freq: Once | INTRAMUSCULAR | Status: AC
  Administered 2022-05-06: 8 mg via INTRAVENOUS
  Filled 2022-05-06: qty 4

## 2022-05-06 MED ORDER — SODIUM CHLORIDE 0.9 % IV SOLN
2490.0000 mg/m2 | INTRAVENOUS | Status: DC
  Administered 2022-05-06: 5000 mg via INTRAVENOUS
  Filled 2022-05-06: qty 100

## 2022-05-06 MED ORDER — SODIUM CHLORIDE 0.9 % IV SOLN
400.0000 mg/m2 | Freq: Once | INTRAVENOUS | Status: AC
  Administered 2022-05-06: 804 mg via INTRAVENOUS
  Filled 2022-05-06: qty 25

## 2022-05-06 MED ORDER — HYDROCODONE-ACETAMINOPHEN 5-325 MG PO TABS
1.0000 | ORAL_TABLET | Freq: Once | ORAL | Status: AC
  Administered 2022-05-06: 1 via ORAL
  Filled 2022-05-06: qty 1

## 2022-05-06 MED ORDER — DEXTROSE 5 % IV SOLN: Freq: Once | INTRAVENOUS | Status: AC

## 2022-05-06 MED ORDER — FLUOROURACIL CHEMO INJECTION 2.5 GM/50ML
400.0000 mg/m2 | Freq: Once | INTRAVENOUS | Status: AC
  Administered 2022-05-06: 800 mg via INTRAVENOUS
  Filled 2022-05-06: qty 16

## 2022-05-06 MED ORDER — SODIUM CHLORIDE 0.9% FLUSH
10.0000 mL | Freq: Once | INTRAVENOUS | Status: AC
  Administered 2022-05-06: 10 mL

## 2022-05-06 NOTE — Patient Instructions (Signed)
[facility]  Discharge Instructions: Thank you for choosing [facility] to provide your oncology and hematology care.   If you have a lab appointment with the [facility], please go directly to the [facility] and check in at the registration area.   Wear comfortable clothing and clothing appropriate for easy access to any Portacath or PICC line.   We strive to give you quality time with your provider. You may need to reschedule your appointment if you arrive late (15 or more minutes).  Arriving late affects you and other patients whose appointments are after yours.  Also, if you miss three or more appointments without notifying the office, you may be dismissed from the clinic at the provider's discretion.      For prescription refill requests, have your pharmacy contact our office and allow 72 hours for refills to be completed.    Today you received the following chemotherapy and/or immunotherapy agents leucovorin, fluorourcil      To help prevent nausea and vomiting after your treatment, we encourage you to take your nausea medication as directed.  BELOW ARE SYMPTOMS THAT SHOULD BE REPORTED IMMEDIATELY: *FEVER GREATER THAN 100.4 F (38 C) OR HIGHER *CHILLS OR SWEATING *NAUSEA AND VOMITING THAT IS NOT CONTROLLED WITH YOUR NAUSEA MEDICATION *UNUSUAL SHORTNESS OF BREATH *UNUSUAL BRUISING OR BLEEDING *URINARY PROBLEMS (pain or burning when urinating, or frequent urination) *BOWEL PROBLEMS (unusual diarrhea, constipation, pain near the anus) TENDERNESS IN MOUTH AND THROAT WITH OR WITHOUT PRESENCE OF ULCERS (sore throat, sores in mouth, or a toothache) UNUSUAL RASH, SWELLING OR PAIN  UNUSUAL VAGINAL DISCHARGE OR ITCHING   Items with * indicate a potential emergency and should be followed up as soon as possible or go to the Emergency Department if any problems should occur.  Please show the CHEMOTHERAPY ALERT CARD or IMMUNOTHERAPY ALERT CARD at  check-in to the Emergency Department and triage nurse.  Should you have questions after your visit or need to cancel or reschedule your appointment, please contact [facility]  Dept: [phone]  and follow the prompts.  Office hours are 8:00 a.m. to 4:30 p.m. Monday - Friday. Please note that voicemails left after 4:00 p.m. may not be returned until the following business day.  We are closed weekends and major holidays. You have access to a nurse at all times for urgent questions. Please call the main number to the clinic Dept: [phone] and follow the prompts.   For any non-urgent questions, you may also contact your provider using MyChart. We now offer e-Visits for anyone 84 and older to request care online for non-urgent symptoms. For details visit [email or web address].   Also download the MyChart app! Go to the app store, search "MyChart", open the app, select [facility], and log in with your MyChart username and password.  Due to Covid, a mask is required upon entering the hospital/clinic. If you do not have a mask, one will be given to you upon arrival. For doctor visits, patients may have 1 support person aged 34 or older with them. For treatment visits, patients cannot have anyone with them due to current Covid guidelines and our immunocompromised population.

## 2022-05-06 NOTE — Progress Notes (Signed)
Denies anyHematology and Oncology Follow Up Visit  Mary Stark [id number] 1956-12-07 66 y.o. 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    Prior Therapy:  She is status post retroperitoneal lymph node biopsy completed on 10/24/2020 which showed metastatic carcinoma.  She is status post repeat cystoscopy and bilateral ureteral stent exchange as well as resection of a bladder tumor on January 21, 2021.  A final pathology showed an adenocarcinoma.  Chemotherapy utilizing carboplatin and gemcitabine started on November 22, 2020.  She completed 6 cycles of therapy on March 06, 2021.  FOLFOX chemotherapy started on Apr 15, 2021.  She completed 12 cycles of therapy.  She is status post radiation therapy to a right psoas metastatic lesion. She will complete 50 Gray in 5 fractions on February 27, 2022.  Current therapy: 5-FU and leucovorin chemotherapy every 2 weeks started in November 2022.  She returns for the next maintenance therapy cycle.  Interim History: Mary Stark is here for return evaluation.  Since last visit, she reports right lower quadrant pain that has been intermittent in the last 2 weeks.  She is still using hydrocodone with improvement in her pain.  She denies any vomiting, fevers or changes in her bowel habits.  She is currently undergoing bowel prep for upcoming colonoscopy.    Medications: Updated with review. Current Outpatient Medications  Medication Sig Dispense Refill   albuterol (PROAIR HFA) 108 (90 Base) MCG/ACT inhaler INHALE 1 TO 2 PUFFS BY MOUTH INTO THE LUNGS EVERY 4 HOURS AS NEEDED FOR WHEEZING OR SHORTNESS OF BREATH 8 g 1   amLODipine (NORVASC) 5 MG tablet Take 5 mg by mouth as needed. Only     ARIPiprazole (ABILIFY) 10 MG tablet TAKE 1 TABLET BY MOUTH ONCE DAILY (Patient taking differently: Take 10 mg by mouth  daily. At night) 90 tablet 0   azelastine (ASTELIN) 0.1 % nasal spray Place 2 sprays into both nostrils 2 (two) times daily. Use in each nostril as directed (Patient taking differently: Place 2 sprays into both nostrils as needed. Use in each nostril as directed) 301 mL 1   bisacodyl (DULCOLAX) 5 MG EC tablet Take 2 tablets by mouth twice a day. 4 tablet 0   bisacodyl (DULCOLAX) 5 MG EC tablet Take 2 tablets (10 mg total) by mouth 2 (two) times daily. 4 tablet 0   buPROPion (WELLBUTRIN XL) 300 MG 24 hr tablet TAKE 1 TABLET BY MOUTH EVERY DAY (Patient taking differently: Take 300 mg by mouth daily.) 30 tablet 3   cephALEXin (KEFLEX) 250 MG capsule Take 1 capsule by mouth nightly as directed (Patient taking differently: Take 250 mg by mouth daily.) 90 capsule 1   clonazePAM (KLONOPIN) 1 MG tablet Take 1 tablet (1 mg total) by mouth at bedtime. 30 tablet 2   cycloSPORINE (RESTASIS) 0.05 % ophthalmic emulsion Place 1 drop both eyes twice a day (Patient taking differently: Place 1 drop into both eyes.) 180 each 3   gabapentin (NEURONTIN) 300 MG capsule Take 1 capsule (300 mg total) by mouth 4 (four) times daily. (Patient taking differently: Take 300 mg by mouth 2 (two) times daily. 1 cap in am and 4 at night) 360 capsule 1   HYDROcodone-acetaminophen (NORCO) 5-325 MG tablet Take 1 tablet by mouth every 6 (six) hours as needed for moderate pain. 30 tablet 0   lidocaine-prilocaine (EMLA) cream Apply  1 application topically as needed. 30 g 0   Meth-Hyo-M Bl-Na Phos-Ph Sal (URIBEL) 118 MG CAPS Take one capsule by mouth 3 times daily (Patient taking differently: as needed.) 30 capsule 3   mirabegron ER (MYRBETRIQ) 25 MG TB24 tablet TAKE 1 TABLET BY MOUTH ONCE A DAY (Patient taking differently: Take 25 mg by mouth daily.) 30 tablet 3   Nebivolol HCl 20 MG TABS Take 1/2 tablet (10 mg total) by mouth daily. (Patient taking differently: Take 10 mg by mouth daily. In am) 90 tablet 1   omeprazole (PRILOSEC) 20 MG  capsule Take 1 capsule (20 mg total) by mouth daily. 90 capsule 3   ondansetron (ZOFRAN) 4 MG tablet Take 1 tablet (4 mg total) by mouth every 8 (eight) hours as needed for nausea or vomiting. 40 tablet 3   oxybutynin (DITROPAN) 5 MG tablet TAKE 1 TABLET BY MOUTH EVERY 8 HOURS AS NEEDED (Patient taking differently: Take 5 mg by mouth every 8 (eight) hours as needed.) 90 tablet 3   oxymetazoline (AFRIN) 0.05 % nasal spray Place 1 spray into both nostrils 2 (two) times daily as needed for congestion. For nose bleeds     Phenazopyridine HCl (AZO URINARY PAIN PO) Take 2 tablets by mouth 3 (three) times daily as needed (bladder spasms).     polyethylene glycol-electrolytes (NULYTELY) 420 g solution Use as directed 4000 mL 0   polyethylene glycol-electrolytes (NULYTELY) 420 g solution Take as directed over 2 days. 4000 mL 0   sertraline (ZOLOFT) 25 MG tablet Take 1 tablet (25 mg total) by mouth daily. 90 tablet 1   simvastatin (ZOCOR) 20 MG tablet TAKE 1 TABLET (20 MG TOTAL) BY MOUTH DAILY. (Patient taking differently: Take 20 mg by mouth at bedtime.) 90 tablet 3   tamsulosin (FLOMAX) 0.4 MG CAPS capsule TAKE 1 CAPSULE BY MOUTH AT BEDTIME (Patient taking differently: Take 0.4 mg by mouth at bedtime.) 30 capsule 3   traMADol (ULTRAM) 50 MG tablet Take 1 tablet (50 mg total) by mouth 3 (three) times daily as needed. 30 tablet 3   No current facility-administered medications for this visit.     Allergies:  Allergies  Allergen Reactions   Metformin And Related Other (See Comments)    Lactic acidosis    Penicillins Hives and Rash    Reports hives to penicillin and amoxicillin as an adult "a long time ago." Has tolerated cefdinir (09/2020) and cefepime (10/2020) before.   Metformin    Penicillin G Sodium       Physical Exam:          Blood pressure 119/79, pulse 72, temperature (!) 97.3 F (36.3 C), temperature source Tympanic, resp. rate 16, weight 173 lb 4.8 oz (78.6 kg), SpO2 97  %.      ECOG: 1   General appearance: Comfortable appearing without any discomfort Head: Normocephalic without any trauma Oropharynx: Mucous membranes are moist and pink without any thrush or ulcers. Eyes: Pupils are equal and round reactive to light. Lymph nodes: No cervical, supraclavicular, inguinal or axillary lymphadenopathy.   Heart:regular rate and rhythm.  S1 and S2 without leg edema. Lung: Clear without any rhonchi or wheezes.  No dullness to percussion. Abdomin: Soft, nontender, nondistended without any rebound or guarding.  No shifting dullness. Musculoskeletal: No joint deformity or effusion.  Full range of motion noted. Neurological: No deficits noted on motor, sensory and deep tendon reflex exam. Skin: No petechial rash or dryness.  Appeared moist.  Lab Results: Lab Results  Component Value Date   WBC 5.1 04/22/2022   HGB 10.5 (L) 04/28/2022   HCT 31.0 (L) 04/28/2022   MCV 111.4 (H) 04/22/2022   PLT 165 04/22/2022     Chemistry      Component Value Date/Time   NA 135 04/28/2022 0848   NA 142 04/26/2018 1333   K 4.4 04/28/2022 0848   CL 100 04/28/2022 0848   CO2 26 04/22/2022 1036   BUN 29 (H) 04/28/2022 0848   BUN 22 04/26/2018 1333   CREATININE 1.40 (H) 04/28/2022 0848   CREATININE 1.16 (H) 04/22/2022 1036   CREATININE 1.32 (H) 06/04/2021 0000   GLU 87 02/07/2013 0000      Component Value Date/Time   CALCIUM 9.0 04/22/2022 1036   ALKPHOS 80 04/22/2022 1036   AST 13 (L) 04/22/2022 1036   ALT 9 04/22/2022 1036   BILITOT 0.3 04/22/2022 1036        Impression and Plan:    66 year old woman with:   1.   Bladder cancer diagnosed in 2021.  She developed stage IV adenocarcinoma with pelvic adenopathy.  Risks and benefits of continuing maintenance chemotherapy were reiterated at this time.  Complications include nausea, vomiting, myelosuppression, neutropenia, and others were reiterated.  Different  salvage therapy options will be deferred unless she has disease progression.  She is agreeable to proceed with treatment at this time.  Therapy could be interrupted and withheld if she is diagnosed with: Cancer.   2.  IV access: Port-A-Cath currently in use without any complications..  3.  Antiemetics: Compazine is available without any nausea or vomiting at this time.   4.  Renal function surveillance: Creatinine clearance remains relatively stable around 50 cc/min.   5.  Goals of care: Her disease is incurable although aggressive measures are warranted.  Her performance status and overall response warrants continued aggressive measures.   6.  Anemia: Hemoglobin is adequate on Apr 28, 2022 was 10.5.  We will continue to monitor.  7.  Colon mass: She is under evaluation by Dr. Paulita Fujita for colonoscopy.  This is suspicious for colon malignancy and she might require surgical intervention.  She is experiencing some abdominal discomfort could be related to constipation versus colon tumor.  Scription for hydrocodone will be available to her.   8.  Follow-up: In 2 weeks for the next cycle of therapy.   30  minutes were spent on this visit.  The time was dedicated to reviewing laboratory data, disease status update, treatment choices and future plan of care discussion.  Zola Button, MD 5/24/20238:06 AM

## 2022-05-07 ENCOUNTER — Other Ambulatory Visit (HOSPITAL_COMMUNITY): Payer: Self-pay

## 2022-05-07 ENCOUNTER — Other Ambulatory Visit: Payer: Self-pay | Admitting: Medical-Surgical

## 2022-05-07 ENCOUNTER — Telehealth: Payer: Self-pay | Admitting: Neurology

## 2022-05-07 HISTORY — PX: COLONOSCOPY WITH PROPOFOL: SHX5780

## 2022-05-07 MED ORDER — GABAPENTIN 300 MG PO CAPS
ORAL_CAPSULE | ORAL | 1 refills | Status: DC
Start: 2022-05-07 — End: 2022-05-07
  Filled 2022-05-07: qty 360, 90d supply, fill #0

## 2022-05-07 MED ORDER — BUPROPION HCL ER (XL) 300 MG PO TB24
ORAL_TABLET | Freq: Every day | ORAL | 1 refills | Status: DC
  Filled 2022-05-07: qty 90, 90d supply, fill #0
  Filled 2022-08-24: qty 90, 90d supply, fill #1

## 2022-05-07 MED ORDER — GABAPENTIN 300 MG PO CAPS
ORAL_CAPSULE | ORAL | 1 refills | Status: DC
  Filled 2022-05-07 – 2022-05-08 (×3): qty 450, 90d supply, fill #0
  Filled 2022-08-28: qty 450, 90d supply, fill #1

## 2022-05-07 NOTE — Telephone Encounter (Signed)
Mary Stark left vm stating patient is taking Gabapentin 300 mg - 1 in the morning and 4 at night. They are asking if they can get a prescription with these directions? Last RX said 1 in the AM, 3 at night. Please advise.

## 2022-05-08 ENCOUNTER — Other Ambulatory Visit (HOSPITAL_COMMUNITY): Payer: Self-pay

## 2022-05-08 ENCOUNTER — Other Ambulatory Visit: Payer: Self-pay

## 2022-05-08 ENCOUNTER — Inpatient Hospital Stay: Payer: No Typology Code available for payment source

## 2022-05-08 ENCOUNTER — Encounter: Payer: Self-pay | Admitting: Medical-Surgical

## 2022-05-08 DIAGNOSIS — C801 Malignant (primary) neoplasm, unspecified: Secondary | ICD-10-CM

## 2022-05-08 DIAGNOSIS — C669 Malignant neoplasm of unspecified ureter: Secondary | ICD-10-CM

## 2022-05-08 DIAGNOSIS — Z5111 Encounter for antineoplastic chemotherapy: Secondary | ICD-10-CM | POA: Diagnosis not present

## 2022-05-08 MED ORDER — HEPARIN SOD (PORK) LOCK FLUSH 100 UNIT/ML IV SOLN
500.0000 [IU] | Freq: Once | INTRAVENOUS | Status: AC | PRN
  Administered 2022-05-08: 500 [IU]

## 2022-05-08 MED ORDER — SODIUM CHLORIDE 0.9% FLUSH
10.0000 mL | INTRAVENOUS | Status: DC | PRN
  Administered 2022-05-08: 10 mL

## 2022-05-12 ENCOUNTER — Other Ambulatory Visit (HOSPITAL_COMMUNITY): Payer: Self-pay

## 2022-05-12 NOTE — Addendum Note (Signed)
Addended by: Annette Stable L on: 05/12/2022 10:50 AM   Modules accepted: Orders

## 2022-05-14 ENCOUNTER — Telehealth: Payer: Self-pay | Admitting: Oncology

## 2022-05-14 NOTE — Telephone Encounter (Signed)
Scheduled per 05/25 los, patient has been called and and notified.

## 2022-05-18 ENCOUNTER — Telehealth: Payer: Self-pay

## 2022-05-18 NOTE — Progress Notes (Unsigned)
   Established Patient Office Visit  Subjective   Patient ID: Mary Stark, female   DOB: [birth year] Age: 66 y.o. MRN: [id number]   No chief complaint on file.   HPI Pleasant 66 year old female presenting today for evaluation of a potential blocked bowel.  ROS    Objective:    There were no vitals filed for this visit.   Physical Exam    No results found for this or any previous visit (from the past 24 hour(s)).   {Labs (Optional):23779}  The ASCVD Risk score (Arnett DK, et al., 2019) failed to calculate for the following reasons:   Cannot find a previous HDL lab   Cannot find a previous total cholesterol lab   Assessment & Plan:   No problem-specific Assessment & Plan notes found for this encounter.   No follow-ups on file.  ___________________________________________ [name], DNP, APRN, FNP-BC [facility] and [facility]

## 2022-05-18 NOTE — Telephone Encounter (Signed)
Pt called stating she has diarrhea x3 days with up to 6 bowel movements per day.  Pt stated her stomach is sore from all the BM.  Pt stated she's drinking up to 6 bottles of flavored water.  Pt denied taking any medications for diarrhea and denied n/v.  Instructed pt to take some Imodium AD for the diarrhea and to drink some Liquid IV or Gatorade Rehydration to replace the electrolytes she's losing in the diarrhea.  Instructed pt that if the diarrhea does not stop or decrease within the next 2 days to please contact Dr. Hazeline Junker office for further assessment.  Pt verbalized understanding of instructions.  Notified Dr. Alen Blew of pt's call.

## 2022-05-19 ENCOUNTER — Encounter: Payer: Self-pay | Admitting: Medical-Surgical

## 2022-05-19 ENCOUNTER — Ambulatory Visit (INDEPENDENT_AMBULATORY_CARE_PROVIDER_SITE_OTHER): Payer: No Typology Code available for payment source | Admitting: Medical-Surgical

## 2022-05-19 VITALS — BP 105/69 | HR 60 | Resp 20 | Ht 66.0 in | Wt 169.3 lb

## 2022-05-19 DIAGNOSIS — I9589 Other hypotension: Secondary | ICD-10-CM

## 2022-05-19 DIAGNOSIS — E861 Hypovolemia: Secondary | ICD-10-CM | POA: Diagnosis not present

## 2022-05-19 DIAGNOSIS — R197 Diarrhea, unspecified: Secondary | ICD-10-CM

## 2022-05-20 ENCOUNTER — Telehealth: Payer: Self-pay

## 2022-05-20 ENCOUNTER — Encounter: Payer: No Typology Code available for payment source | Admitting: Physician Assistant

## 2022-05-20 ENCOUNTER — Other Ambulatory Visit: Payer: No Typology Code available for payment source

## 2022-05-20 ENCOUNTER — Ambulatory Visit: Payer: No Typology Code available for payment source

## 2022-05-20 NOTE — Telephone Encounter (Signed)
RN called patient and left voicemail regarding [facility] appointment scheduled for today. Patient was encouraged to call [facility] back in order to confirm/cancel appointment for today.

## 2022-05-21 ENCOUNTER — Other Ambulatory Visit: Payer: Self-pay

## 2022-05-21 ENCOUNTER — Telehealth: Payer: Self-pay

## 2022-05-21 ENCOUNTER — Other Ambulatory Visit (HOSPITAL_COMMUNITY): Payer: Self-pay

## 2022-05-21 DIAGNOSIS — Z5111 Encounter for antineoplastic chemotherapy: Secondary | ICD-10-CM

## 2022-05-21 DIAGNOSIS — Z95828 Presence of other vascular implants and grafts: Secondary | ICD-10-CM

## 2022-05-21 DIAGNOSIS — C801 Malignant (primary) neoplasm, unspecified: Secondary | ICD-10-CM

## 2022-05-21 DIAGNOSIS — C669 Malignant neoplasm of unspecified ureter: Secondary | ICD-10-CM

## 2022-05-21 NOTE — Telephone Encounter (Signed)
Returned patient's call regarding request for IVF. Patient is confirmed to come in to [facility] for IVF infusion tomorrow, June 9th at 1:00 PM. Patient aware of appointments.  Patient declines to be seen by provider at this time.  Orders placed under sign and held.

## 2022-05-22 ENCOUNTER — Other Ambulatory Visit (HOSPITAL_COMMUNITY): Payer: Self-pay

## 2022-05-22 ENCOUNTER — Inpatient Hospital Stay (HOSPITAL_BASED_OUTPATIENT_CLINIC_OR_DEPARTMENT_OTHER): Payer: No Typology Code available for payment source | Admitting: Physician Assistant

## 2022-05-22 ENCOUNTER — Other Ambulatory Visit: Payer: Self-pay

## 2022-05-22 ENCOUNTER — Other Ambulatory Visit: Payer: Self-pay | Admitting: Oncology

## 2022-05-22 ENCOUNTER — Inpatient Hospital Stay: Payer: No Typology Code available for payment source | Attending: Oncology

## 2022-05-22 VITALS — BP 122/65 | HR 58 | Temp 97.7°F | Resp 16 | Wt 173.4 lb

## 2022-05-22 DIAGNOSIS — E860 Dehydration: Secondary | ICD-10-CM | POA: Insufficient documentation

## 2022-05-22 DIAGNOSIS — Z5111 Encounter for antineoplastic chemotherapy: Secondary | ICD-10-CM

## 2022-05-22 DIAGNOSIS — C669 Malignant neoplasm of unspecified ureter: Secondary | ICD-10-CM

## 2022-05-22 DIAGNOSIS — E875 Hyperkalemia: Secondary | ICD-10-CM | POA: Insufficient documentation

## 2022-05-22 DIAGNOSIS — C801 Malignant (primary) neoplasm, unspecified: Secondary | ICD-10-CM

## 2022-05-22 DIAGNOSIS — R109 Unspecified abdominal pain: Secondary | ICD-10-CM | POA: Diagnosis not present

## 2022-05-22 DIAGNOSIS — Z95828 Presence of other vascular implants and grafts: Secondary | ICD-10-CM

## 2022-05-22 DIAGNOSIS — D630 Anemia in neoplastic disease: Secondary | ICD-10-CM | POA: Insufficient documentation

## 2022-05-22 DIAGNOSIS — G893 Neoplasm related pain (acute) (chronic): Secondary | ICD-10-CM | POA: Insufficient documentation

## 2022-05-22 DIAGNOSIS — C772 Secondary and unspecified malignant neoplasm of intra-abdominal lymph nodes: Secondary | ICD-10-CM | POA: Insufficient documentation

## 2022-05-22 DIAGNOSIS — R143 Flatulence: Secondary | ICD-10-CM | POA: Insufficient documentation

## 2022-05-22 DIAGNOSIS — Z5189 Encounter for other specified aftercare: Secondary | ICD-10-CM | POA: Insufficient documentation

## 2022-05-22 DIAGNOSIS — Z79899 Other long term (current) drug therapy: Secondary | ICD-10-CM | POA: Insufficient documentation

## 2022-05-22 DIAGNOSIS — Z515 Encounter for palliative care: Secondary | ICD-10-CM | POA: Insufficient documentation

## 2022-05-22 DIAGNOSIS — C785 Secondary malignant neoplasm of large intestine and rectum: Secondary | ICD-10-CM | POA: Diagnosis not present

## 2022-05-22 DIAGNOSIS — K56609 Unspecified intestinal obstruction, unspecified as to partial versus complete obstruction: Secondary | ICD-10-CM | POA: Diagnosis not present

## 2022-05-22 DIAGNOSIS — F17290 Nicotine dependence, other tobacco product, uncomplicated: Secondary | ICD-10-CM | POA: Insufficient documentation

## 2022-05-22 LAB — CBC WITH DIFFERENTIAL (CANCER CENTER ONLY)
Abs Immature Granulocytes: 0.03 10*3/uL (ref 0.00–0.07)
Basophils Absolute: 0 10*3/uL (ref 0.0–0.1)
Basophils Relative: 1 %
Eosinophils Absolute: 0.1 10*3/uL (ref 0.0–0.5)
Eosinophils Relative: 2 %
HCT: 26.7 % — ABNORMAL LOW (ref 36.0–46.0)
Hemoglobin: 8.6 g/dL — ABNORMAL LOW (ref 12.0–15.0)
Immature Granulocytes: 1 %
Lymphocytes Relative: 22 %
Lymphs Abs: 1.3 10*3/uL (ref 0.7–4.0)
MCH: 35.8 pg — ABNORMAL HIGH (ref 26.0–34.0)
MCHC: 32.2 g/dL (ref 30.0–36.0)
MCV: 111.3 fL — ABNORMAL HIGH (ref 80.0–100.0)
Monocytes Absolute: 0.8 10*3/uL (ref 0.1–1.0)
Monocytes Relative: 14 %
Neutro Abs: 3.5 10*3/uL (ref 1.7–7.7)
Neutrophils Relative %: 60 %
Platelet Count: 158 10*3/uL (ref 150–400)
RBC: 2.4 MIL/uL — ABNORMAL LOW (ref 3.87–5.11)
RDW: 15.7 % — ABNORMAL HIGH (ref 11.5–15.5)
WBC Count: 5.7 10*3/uL (ref 4.0–10.5)
nRBC: 0 % (ref 0.0–0.2)

## 2022-05-22 LAB — SAMPLE TO BLOOD BANK

## 2022-05-22 LAB — CMP (CANCER CENTER ONLY)
ALT: 5 U/L (ref 0–44)
AST: 10 U/L — ABNORMAL LOW (ref 15–41)
Albumin: 3.5 g/dL (ref 3.5–5.0)
Alkaline Phosphatase: 77 U/L (ref 38–126)
Anion gap: 5 (ref 5–15)
BUN: 15 mg/dL (ref 8–23)
CO2: 26 mmol/L (ref 22–32)
Calcium: 8.7 mg/dL — ABNORMAL LOW (ref 8.9–10.3)
Chloride: 102 mmol/L (ref 98–111)
Creatinine: 1.31 mg/dL — ABNORMAL HIGH (ref 0.44–1.00)
GFR, Estimated: 45 mL/min — ABNORMAL LOW (ref >60)
Glucose, Bld: 90 mg/dL (ref 70–99)
Potassium: 5.4 mmol/L — ABNORMAL HIGH (ref 3.5–5.1)
Sodium: 133 mmol/L — ABNORMAL LOW (ref 135–145)
Total Bilirubin: 0.4 mg/dL (ref 0.3–1.2)
Total Protein: 6.3 g/dL — ABNORMAL LOW (ref 6.5–8.1)

## 2022-05-22 MED ORDER — SODIUM CHLORIDE 0.9% FLUSH
10.0000 mL | Freq: Once | INTRAVENOUS | Status: AC
  Administered 2022-05-22: 10 mL via INTRAVENOUS

## 2022-05-22 MED ORDER — SODIUM CHLORIDE 0.9 % IV SOLN: Freq: Once | INTRAVENOUS | Status: AC

## 2022-05-22 MED ORDER — MORPHINE SULFATE (PF) 2 MG/ML IV SOLN
2.0000 mg | Freq: Once | INTRAVENOUS | Status: AC
  Administered 2022-05-22: 2 mg via INTRAVENOUS
  Filled 2022-05-22: qty 1

## 2022-05-22 MED ORDER — ONDANSETRON HCL 4 MG/2ML IJ SOLN
4.0000 mg | Freq: Once | INTRAMUSCULAR | Status: AC
  Administered 2022-05-22: 4 mg via INTRAVENOUS
  Filled 2022-05-22: qty 2

## 2022-05-22 MED ORDER — HYDROCODONE-ACETAMINOPHEN 5-325 MG PO TABS
1.0000 | ORAL_TABLET | Freq: Four times a day (QID) | ORAL | 0 refills | Status: DC | PRN
  Filled 2022-05-22: qty 30, 8d supply, fill #0

## 2022-05-22 MED ORDER — HEPARIN SOD (PORK) LOCK FLUSH 100 UNIT/ML IV SOLN
500.0000 [IU] | Freq: Once | INTRAVENOUS | Status: AC
  Administered 2022-05-22: 500 [IU]

## 2022-05-22 NOTE — Progress Notes (Signed)
Symptom Management Consult note [place]    Patient Care Team: [name], NP as PCP - General (Nurse Practitioner) Norma Fredrickson, MD as Consulting Physician (Psychiatry) Gregor Hams, MD as Consulting Physician (Sports Medicine)    Name of the patient: Mary Stark  [id number]  Mar 19, 1956   Date of visit: 05/22/2022    Chief complaint/ Reason for visit- abdominal pain  Oncology History  Malignancy ([place])  10/31/2020 Initial Diagnosis   Malignancy ([place])   11/22/2020 - 03/06/2021 Chemotherapy         04/15/2021 - 05/08/2022 Chemotherapy   Patient is on Treatment Plan : COLORECTAL FOLFOX q14d     05/27/2022 -  Chemotherapy   Patient is on Treatment Plan : COLORECTAL FOLFIRI q14d     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[place])  11/06/2020 Initial Diagnosis   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 with oncologic history as above presenting to [facility] today with chief complaint of abdominal pain x 2 weeks. Pain is intermittent. Pain is located across her lower abdomen and occasionally radiates around to her lower back. She describes pain as an aching sensation. Pain waxes and wanes, this morning was 8/10 in severity. She took a Vicodin and pain improved. She states since her colonoscopy on 05/07/22 she has had irregular bowel habits. For the last 5 days she has been constipated. She admits to frequent flatulence. She has been sporadically taking Metamucil.  She  does admit to poor p.o. intake over the last several days stating she only drinks 2 bottles of water daily.  She denies fever, chills, chest pain, shortness of breath, nausea, emesis, urinary symptoms.      ROS  All other systems are reviewed and are negative for acute change except as noted in the HPI.    Allergies  Allergen Reactions   Metformin And Related Other (See Comments)    Lactic acidosis    Penicillins Hives and Rash    Reports hives to penicillin and amoxicillin as an adult "a long time ago." Has tolerated cefdinir (09/2020) and cefepime (10/2020) before.   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 10/22/2021   Family history of adverse reaction to anesthesia  father had a "tight" airway   GERD (gastroesophageal reflux disease)    History of 2019 novel coronavirus disease (COVID-19) 08/13/2020   positive result in epic 08-19-2020,  hospital admission in epic,  covid pneumonia with hypoxia respiratory failure, discharged with oxygen for 6 weeks   History of chemotherapy last done 05-13-2021   History of colitis 03/2018   acute colitis with sepsis   History of hepatitis C    TX FOR 2012   History of kidney stones    History of rheumatic fever as a child    per pt no valvular issues   History of sepsis 10/2020   hospital admission in epic due to pyelonephritis due to hydronephrosis, resolved   History of skin cancer    excision from nose    Hyperlipidemia    Hypertension    MDD (major depressive disorder)    Pneumonia    august 2022   Seasonal allergies    Sepsis ([place])    uro sepsis June 2022   Vitamin D deficiency      Past Surgical History:  Procedure Laterality Date   ANAL RECTAL MANOMETRY N/A 01/31/2020   Procedure: ANO RECTAL MANOMETRY;  Surgeon: Arta Silence, MD;  Location: [facility];  Service: Endoscopy;  Laterality: N/A;   CESAREAN SECTION  1984   CYSTOSCOPY W/ URETERAL STENT PLACEMENT Bilateral 01/21/2021   Procedure: CYSTOSCOPY WITH STENT REPLACEMENT;  Surgeon: Robley Fries, MD;  Location: [facility];  Service: Urology;  Laterality: Bilateral;  1 HR   CYSTOSCOPY W/ URETERAL STENT PLACEMENT Bilateral 05/20/2021   Procedure: CYSTOSCOPY WITH RETROGRADE PYELOGRAM/URETERAL STENT EXCHANGE;  Surgeon: Robley Fries, MD;  Location: [facility];  Service: Urology;  Laterality: Bilateral;   CYSTOSCOPY W/ URETERAL STENT PLACEMENT Bilateral 10/28/2021   Procedure: CYSTOSCOPY WITH RETROGRADE PYELOGRAM/URETERAL STENT EXCHANGE;  Surgeon: Robley Fries, MD;  Location: [facility];  Service: Urology;  Laterality: Bilateral;   CYSTOSCOPY W/ URETERAL STENT PLACEMENT Bilateral 04/28/2022   Procedure: CYSTOSCOPY Wyvonnia Dusky STENT EXCHANGE;  Surgeon: Robley Fries, MD;  Location: [facility];  Service: Urology;  Laterality: Bilateral;  1 HR   CYSTOSCOPY WITH RETROGRADE PYELOGRAM, URETEROSCOPY AND STENT PLACEMENT Bilateral 06/11/2020   Procedure: CYSTOSCOPY WITH RETROGRADE PYELOGRAM, URETEROSCOPY AND STENT PLACEMENT, RIGHT URETERAL BIOPSY;  Surgeon: Robley Fries, MD;  Location: [facility];  Service: Urology;  Laterality: Bilateral;  76 MINS   CYSTOSCOPY WITH RETROGRADE PYELOGRAM, URETEROSCOPY AND STENT PLACEMENT Bilateral 07/05/2020   Procedure: CYSTOSCOPY WITH RETROGRADE PYELOGRAM, URETEROSCOPY,  BIOPSIES AND STENT EXCHANGES;  Surgeon: Robley Fries, MD;   Location: [facility];  Service: Urology;  Laterality: Bilateral;   FINGER SURGERY Right    5th   GYNECOLOGIC CRYOSURGERY  YRS AGO   IR IMAGING GUIDED PORT INSERTION  11/18/2020   TONSILLECTOMY  AS CHILD   TRANSURETHRAL RESECTION OF BLADDER TUMOR  01/21/2021   Procedure: TRANSURETHRAL RESECTION OF BLADDER TUMOR (TURBT);  Surgeon: Robley Fries, MD;  Location: [facility];  Service: Urology;;    Social History   Socioeconomic History   Marital status: Married    Spouse name: Not on file   Number of children: Not on file   Years of education: Not on file   Highest education level: Not on file  Occupational History   Not on file  Tobacco Use   Smoking status: Former    Packs/day: 1.00    Years: 25.00    Total  pack years: 25.00    Types: Cigarettes    Quit date: 2012    Years since quitting: 11.4   Smokeless tobacco: Never  Vaping Use   Vaping Use: Every day   Substances: Nicotine  Substance and Sexual Activity   Alcohol use: Yes    Comment: occas beer   Drug use: No   Sexual activity: Not on file  Other Topics Concern   Not on file  Social History Narrative   Not on file   Social Determinants of Health   Financial Resource Strain: Medium Risk (06/06/2021)   Overall Financial Resource Strain (CARDIA)    Difficulty of Paying Living Expenses: Somewhat hard  Food Insecurity: No Food Insecurity (06/06/2021)   Hunger Vital Sign    Worried About Running Out of Food in the Last Year: Never true    Ran Out of Food in the Last Year: Never true  Transportation Needs: No Transportation Needs (06/06/2021)   PRAPARE - Hydrologist (Medical): No    Lack of Transportation (Non-Medical): No  Physical Activity: Not on file  Stress: No Stress Concern Present (06/06/2021)   Wing    Feeling of Stress : Not at all  Social Connections: Moderately Integrated  (06/06/2021)   Social Connection and Isolation Panel [NHANES]    Frequency of Communication with Friends and Family: More than three times a week    Frequency of Social Gatherings with Friends and Family: Never    Attends Religious Services: Never    Marine scientist or Organizations: No    Attends Music therapist: 1 to 4 times per year    Marital Status: Married  Human resources officer Violence: Not on file    Family History  Problem Relation Age of Onset   Ovarian cancer Mother    Cardiomyopathy Father    Valvular heart disease Father    Liver cancer Brother    Protein C deficiency Brother    Protein C deficiency Brother    Stroke Brother      Current Outpatient Medications:    HYDROcodone-acetaminophen (NORCO) 5-325 MG tablet, Take 1 tablet by mouth every 6 (six) hours as needed for severe pain., Disp: 30 tablet, Rfl: 0   albuterol (PROAIR HFA) 108 (90 Base) MCG/ACT inhaler, INHALE 1 TO 2 PUFFS BY MOUTH INTO THE LUNGS EVERY 4 HOURS AS NEEDED FOR WHEEZING OR SHORTNESS OF BREATH, Disp: 8 g, Rfl: 1   ARIPiprazole (ABILIFY) 10 MG tablet, TAKE 1 TABLET BY MOUTH ONCE DAILY (Patient taking differently: Take 10 mg by mouth daily. At night), Disp: 90 tablet, Rfl: 0   azelastine (ASTELIN) 0.1 % nasal spray, Place 2 sprays into both nostrils 2 (two) times daily. Use in each nostril as directed (Patient taking differently: Place 2 sprays into both nostrils as needed. Use in each nostril as directed), Disp: 301 mL, Rfl: 1   bisacodyl (DULCOLAX) 5 MG EC tablet, Take 2 tablets by mouth twice a day., Disp: 4 tablet, Rfl: 0   bisacodyl (DULCOLAX) 5 MG EC tablet, Take 2 tablets (10 mg total) by mouth 2 (two) times daily., Disp: 4 tablet, Rfl: 0   buPROPion (WELLBUTRIN XL) 300 MG 24 hr tablet, TAKE 1 TABLET BY MOUTH EVERY DAY, Disp: 90 tablet, Rfl: 1   cephALEXin (KEFLEX) 250 MG capsule, Take 1 capsule by mouth nightly as directed (Patient taking differently: Take 250 mg by mouth daily.),  Disp: 90  capsule, Rfl: 1   clonazePAM (KLONOPIN) 1 MG tablet, Take 1 tablet (1 mg total) by mouth at bedtime., Disp: 30 tablet, Rfl: 2   cycloSPORINE (RESTASIS) 0.05 % ophthalmic emulsion, Place 1 drop both eyes twice a day (Patient taking differently: Place 1 drop into both eyes.), Disp: 180 each, Rfl: 3   gabapentin (NEURONTIN) 300 MG capsule, Take 1 capsule by mouth daily and 4 capsules at bedtime as directed., Disp: 450 capsule, Rfl: 1   lidocaine-prilocaine (EMLA) cream, Apply 1 application topically as needed., Disp: 30 g, Rfl: 0   Meth-Hyo-M Bl-Na Phos-Ph Sal (URIBEL) 118 MG CAPS, Take one capsule by mouth 3 times daily (Patient taking differently: as needed.), Disp: 30 capsule, Rfl: 3   mirabegron ER (MYRBETRIQ) 25 MG TB24 tablet, TAKE 1 TABLET BY MOUTH ONCE A DAY (Patient taking differently: Take 25 mg by mouth daily.), Disp: 30 tablet, Rfl: 3   Nebivolol HCl 20 MG TABS, Take 1/2 tablet (10 mg total) by mouth daily. (Patient taking differently: Take 10 mg by mouth daily. In am), Disp: 90 tablet, Rfl: 1   omeprazole (PRILOSEC) 20 MG capsule, Take 1 capsule (20 mg total) by mouth daily., Disp: 90 capsule, Rfl: 3   ondansetron (ZOFRAN) 4 MG tablet, Take 1 tablet (4 mg total) by mouth every 8 (eight) hours as needed for nausea or vomiting., Disp: 40 tablet, Rfl: 3   oxymetazoline (AFRIN) 0.05 % nasal spray, Place 1 spray into both nostrils 2 (two) times daily as needed for congestion. For nose bleeds, Disp: , Rfl:    Phenazopyridine HCl (AZO URINARY PAIN PO), Take 2 tablets by mouth 3 (three) times daily as needed (bladder spasms)., Disp: , Rfl:    polyethylene glycol-electrolytes (NULYTELY) 420 g solution, Use as directed, Disp: 4000 mL, Rfl: 0   polyethylene glycol-electrolytes (NULYTELY) 420 g solution, Take as directed over 2 days., Disp: 4000 mL, Rfl: 0   sertraline (ZOLOFT) 25 MG tablet, Take 1 tablet (25 mg total) by mouth daily., Disp: 90 tablet, Rfl: 1   simvastatin (ZOCOR) 20 MG tablet,  TAKE 1 TABLET (20 MG TOTAL) BY MOUTH DAILY. (Patient taking differently: Take 20 mg by mouth at bedtime.), Disp: 90 tablet, Rfl: 3   tamsulosin (FLOMAX) 0.4 MG CAPS capsule, TAKE 1 CAPSULE BY MOUTH AT BEDTIME (Patient taking differently: Take 0.4 mg by mouth at bedtime.), Disp: 30 capsule, Rfl: 3  PHYSICAL EXAM: ECOG FS:1 - Symptomatic but completely ambulatory  T: 97.7   BP: 105/73    HR: 66   Resp: 18  O2: 98% Physical Exam Vitals and nursing note reviewed.  Constitutional:      Appearance: She is well-developed. She is not ill-appearing or toxic-appearing.  HENT:     Head: Normocephalic and atraumatic.     Nose: Nose normal.     Mouth/Throat:     Mouth: Mucous membranes are dry.  Eyes:     General: No scleral icterus.       Right eye: No discharge.        Left eye: No discharge.     Conjunctiva/sclera: Conjunctivae normal.  Neck:     Vascular: No JVD.  Cardiovascular:     Rate and Rhythm: Normal rate and regular rhythm.     Pulses: Normal pulses.     Heart sounds: Normal heart sounds.  Pulmonary:     Effort: Pulmonary effort is normal.     Breath sounds: Normal breath sounds.  Abdominal:     General: Bowel sounds  are normal. There is no distension.     Palpations: Abdomen is soft. There is no mass.     Tenderness: There is no right CVA tenderness, left CVA tenderness, guarding or rebound.     Hernia: No hernia is present.     Comments: Tenderness to palpation of RLQ and LLQ without peritoneal signs  Musculoskeletal:        General: Normal range of motion.     Cervical back: Normal range of motion.  Skin:    General: Skin is warm and dry.  Neurological:     Mental Status: She is oriented to person, place, and time.     GCS: GCS eye subscore is 4. GCS verbal subscore is 5. GCS motor subscore is 6.     Comments: Fluent speech, no facial droop.  Psychiatric:        Behavior: Behavior normal.        LABORATORY DATA: I have reviewed the data as listed    Latest Ref  Rng & Units 05/22/2022   12:55 PM 05/06/2022    8:15 AM 04/28/2022    8:48 AM  CBC  WBC 4.0 - 10.5 K/uL 5.7  5.8    Hemoglobin 12.0 - 15.0 g/dL 8.6  9.1  10.5   Hematocrit 36.0 - 46.0 % 26.7  27.6  31.0   Platelets 150 - 400 K/uL 158  145          Latest Ref Rng & Units 05/22/2022   12:55 PM 05/06/2022    8:15 AM 04/28/2022    8:48 AM  CMP  Glucose 70 - 99 mg/dL 90  105  106   BUN 8 - 23 mg/dL '15  22  29   '$ Creatinine 0.44 - 1.00 mg/dL 1.31  1.40  1.40   Sodium 135 - 145 mmol/L 133  134  135   Potassium 3.5 - 5.1 mmol/L 5.4  4.7  4.4   Chloride 98 - 111 mmol/L 102  100  100   CO2 22 - 32 mmol/L 26  25    Calcium 8.9 - 10.3 mg/dL 8.7  9.1    Total Protein 6.5 - 8.1 g/dL 6.3  7.2    Total Bilirubin 0.3 - 1.2 mg/dL 0.4  0.4    Alkaline Phos 38 - 126 U/L 77  88    AST 15 - 41 U/L 10  12    ALT 0 - 44 U/L 5  7         RADIOGRAPHIC STUDIES: I have personally reviewed the radiological images as listed and agreed with the findings in the report. No images are attached to the encounter. No results found.   ASSESSMENT & PLAN: Patient is a 66 y.o. female  with oncologic history of stage IV adenocarcinoma of bladder followed by Dr. Alen Blew.  I have viewed most recent oncology and GI note and lab work.   #)Dehydration- Patient clinically appears dehydrated with dry mucus membranes.  Patient given a liter of IV fluids here in clinic.  CMP shows kidney function is improved compared to labs 2 weeks ago.  She does have mild hyperkalemia with a potassium of 5.4, likely related to dehydration in the setting of decreased p.o. intake  #)Abdominal pain- patient is nontoxic-appearing.  On exam she has mild tenderness to palpation of bilateral lower quadrants without peritoneal signs.  Symptoms are suggestive of constipation.  Ddx includes small bowel obstruction however feel this is less likely as flatus.  Engaged in  shared decision making with patient and she does not wish to have any imaging at this  time. She is agreeable with plan for symptomatic treatment of constipation to include stool softener, increase fluid intake and MiraLAX.  Patient given pain medicine while here in clinic.  Patient voiced concern about pain medication at home contributing constipation.  She admits to having difficulty managing her symptoms and is interested in palliative care for additional support.  Referral sent.  Refill sent for her hydrocodone for pain.   #) Anemia-hemoglobin today is 8.6 which is down from 9.1 x 2 weeks ago.  No indications for urgent transfusion.  Hemoglobin will continue to be trended at upcoming oncology appointments.   Shared visit with oncologist Dr. Alen Blew. See addendum below.  Visit Diagnosis: 1. Abdominal pain, unspecified abdominal location   2. Malignancy ([place])      Orders Placed This Encounter  Procedures   Amb Referral to Palliative Care    Referral Priority:   Routine    Referral Type:   Consultation    Referred to Provider:   Jimmy Footman, NP    Number of Visits Requested:   1    All questions were answered. The patient knows to call the clinic with any problems, questions or concerns. No barriers to learning was detected.  I have spent a total of 30 minutes minutes of face-to-face and non-face-to-face time, preparing to see the patient, obtaining and/or reviewing separately obtained history, performing a medically appropriate examination, counseling and educating the patient, ordering tests,  documenting clinical information in the electronic health record, and care coordination.     Thank you for allowing me to participate in the care of this patient.    Barrie Folk, PA-C Department of Hematology/Oncology [facility] Phone: [phone]  [phone]    05/22/2022 2:58 PM   Patient seen and examined today personally.  She has reported abdominal pain and changes in her bowel habits since  her colonoscopy.  She denies any nausea or vomiting.  The results of her pathology were personally reviewed and discussed with the patient.  I recommended supportive management at this time with hydration and adequate bowel regimen to prevent constipation.  From my cancer management standpoint, I recommended switching her to FOLFIRI instead of FOLFOX given her disease progression.  Surgical resection of her overall tumor will be considered if she has large bowel obstruction.  This was discussed with the patient today in detail and she is agreeable with this plan.  Zola Button MD 05/22/2022

## 2022-05-22 NOTE — Progress Notes (Signed)
DISCONTINUE OFF PATHWAY REGIMEN - Bladder   [id number] (Leucovorin IV D1 + Fluorouracil IV D1/CIV D1,2 + Oxaliplatin IV D1) q14 Days:   A cycle is every 14 days:     Oxaliplatin      Leucovorin      Fluorouracil      Fluorouracil   **Always confirm dose/schedule in your pharmacy ordering system**  REASON: Disease Progression PRIOR TREATMENT: Off Pathway: [id number] (Leucovorin IV D1 + Fluorouracil IV D1/CIV D1,2 + Oxaliplatin IV D1) q14 Days TREATMENT RESPONSE: Partial Response (PR)  START OFF PATHWAY REGIMEN - Bladder   [id number] (Leucovorin IV D1 + Fluorouracil IV D1/CIV D1,2 + Irinotecan IV D1) q14 Days:   A cycle is every 14 days:     Irinotecan      Leucovorin      Fluorouracil      Fluorouracil   **Always confirm dose/schedule in your pharmacy ordering system**  Patient Characteristics: Advanced/Metastatic Disease, Third Line and Beyond Therapeutic Status: Advanced/Metastatic Disease Line of Therapy: Third Line and Beyond  Intent of Therapy: Non-Curative / Palliative Intent, Discussed with Patient

## 2022-05-22 NOTE — Patient Instructions (Signed)
Rehydration, Adult Rehydration is the replacement of body fluids, salts, and minerals (electrolytes) that are lost during dehydration. Dehydration is when there is not enough water or other fluids in the body. This happens when you lose more fluids than you take in. Common causes of dehydration include: Not drinking enough fluids. This can occur when you are ill or doing activities that require a lot of energy, especially in hot weather. Conditions that cause loss of water or other fluids, such as diarrhea, vomiting, sweating, or urinating a lot. Other illnesses, such as fever or infection. Certain medicines, such as those that remove excess fluid from the body (diuretics). Symptoms of mild or moderate dehydration may include thirst, dry lips and mouth, and dizziness. Symptoms of severe dehydration may include increased heart rate, confusion, fainting, and not urinating. For severe dehydration, you may need to get fluids through an IV at the hospital. For mild or moderate dehydration, you can usually rehydrate at home by drinking certain fluids as told by your health care provider. What are the risks? Generally, rehydration is safe. However, taking in too much fluid (overhydration) can be a problem. This is rare. Overhydration can cause an electrolyte imbalance, kidney failure, or a decrease in salt (sodium) levels in the body. Supplies needed You will need an oral rehydration solution (ORS) if your health care provider tells you to use one. This is a drink to treat dehydration. It can be found in pharmacies and retail stores. How to rehydrate Fluids Follow instructions from your health care provider for rehydration. The kind of fluid and the amount you should drink depend on your condition. In general, you should choose drinks that you prefer. If told by your health care provider, drink an ORS. Make an ORS by following instructions on the package. Start by drinking small amounts, about  cup (120  mL) every 5-10 minutes. Slowly increase how much you drink until you have taken the amount recommended by your health care provider. Drink enough clear fluids to keep your urine pale yellow. If you were told to drink an ORS, finish it first, then start slowly drinking other clear fluids. Drink fluids such as: Water. This includes sparkling water and flavored water. Drinking only water can lead to having too little sodium in your body (hyponatremia). Follow the advice of your health care provider. Water from ice chips you suck on. Fruit juice with water you add to it (diluted). Sports drinks. Hot or cold herbal teas. Broth-based soups. Milk or milk products. Food Follow instructions from your health care provider about what to eat while you rehydrate. Your health care provider may recommend that you slowly begin eating regular foods in small amounts. Eat foods that contain a healthy balance of electrolytes, such as bananas, oranges, potatoes, tomatoes, and spinach. Avoid foods that are greasy or contain a lot of sugar. In some cases, you may get nutrition through a feeding tube that is passed through your nose and into your stomach (nasogastric tube, or NG tube). This may be done if you have uncontrolled vomiting or diarrhea. Beverages to avoid  Certain beverages may make dehydration worse. While you rehydrate, avoid drinking alcohol. How to tell if you are recovering from dehydration You may be recovering from dehydration if: You are urinating more often than before you started rehydrating. Your urine is pale yellow. Your energy level improves. You vomit less frequently. You have diarrhea less frequently. Your appetite improves or returns to normal. You feel less dizzy or less light-headed.   Your skin tone and color start to look more normal. Follow these instructions at home: Take over-the-counter and prescription medicines only as told by your health care provider. Do not take sodium  tablets. Doing this can lead to having too much sodium in your body (hypernatremia). Contact a health care provider if: You continue to have symptoms of mild or moderate dehydration, such as: Thirst. Dry lips. Slightly dry mouth. Dizziness. Dark urine or less urine than normal. Muscle cramps. You continue to vomit or have diarrhea. Get help right away if you: Have symptoms of dehydration that get worse. Have a fever. Have a severe headache. Have been vomiting and the following happens: Your vomiting gets worse or does not go away. Your vomit includes blood or green matter (bile). You cannot eat or drink without vomiting. Have problems with urination or bowel movements, such as: Diarrhea that gets worse or does not go away. Blood in your stool (feces). This may cause stool to look black and tarry. Not urinating, or urinating only a small amount of very dark urine, within 6-8 hours. Have trouble breathing. Have symptoms that get worse with treatment. These symptoms may represent a serious problem that is an emergency. Do not wait to see if the symptoms will go away. Get medical help right away. Call your local emergency services (911 in the [place] Do not drive yourself to the hospital. Summary Rehydration is the replacement of body fluids and minerals (electrolytes) that are lost during dehydration. Follow instructions from your health care provider for rehydration. The kind of fluid and amount you should drink depend on your condition. Slowly increase how much you drink until you have taken the amount recommended by your health care provider. Contact your health care provider if you continue to show signs of mild or moderate dehydration. This information is not intended to replace advice given to you by your health care provider. Make sure you discuss any questions you have with your health care provider. Document Revised: 01/31/2020 Document Reviewed: 12/11/2019 [employer] Patient  Education  2023 [employer]  

## 2022-05-23 ENCOUNTER — Emergency Department (HOSPITAL_COMMUNITY): Payer: No Typology Code available for payment source

## 2022-05-23 ENCOUNTER — Inpatient Hospital Stay (HOSPITAL_COMMUNITY)
Admission: EM | Admit: 2022-05-23 | Discharge: 2022-05-30 | DRG: 329 | Disposition: A | Discharge status: Home / Self Care | Payer: No Typology Code available for payment source | Attending: Internal Medicine | Admitting: Internal Medicine

## 2022-05-23 ENCOUNTER — Other Ambulatory Visit: Payer: Self-pay

## 2022-05-23 ENCOUNTER — Encounter (HOSPITAL_COMMUNITY): Payer: Self-pay

## 2022-05-23 DIAGNOSIS — C785 Secondary malignant neoplasm of large intestine and rectum: Principal | ICD-10-CM | POA: Diagnosis present

## 2022-05-23 DIAGNOSIS — E785 Hyperlipidemia, unspecified: Secondary | ICD-10-CM | POA: Diagnosis present

## 2022-05-23 DIAGNOSIS — F1090 Alcohol use, unspecified, uncomplicated: Secondary | ICD-10-CM | POA: Diagnosis present

## 2022-05-23 DIAGNOSIS — N1330 Unspecified hydronephrosis: Secondary | ICD-10-CM | POA: Diagnosis present

## 2022-05-23 DIAGNOSIS — F1021 Alcohol dependence, in remission: Secondary | ICD-10-CM | POA: Diagnosis present

## 2022-05-23 DIAGNOSIS — Z800 Family history of malignant neoplasm of digestive organs: Secondary | ICD-10-CM

## 2022-05-23 DIAGNOSIS — C786 Secondary malignant neoplasm of retroperitoneum and peritoneum: Secondary | ICD-10-CM

## 2022-05-23 DIAGNOSIS — Z888 Allergy status to other drugs, medicaments and biological substances status: Secondary | ICD-10-CM

## 2022-05-23 DIAGNOSIS — F32A Depression, unspecified: Secondary | ICD-10-CM | POA: Diagnosis present

## 2022-05-23 DIAGNOSIS — G9341 Metabolic encephalopathy: Secondary | ICD-10-CM | POA: Diagnosis not present

## 2022-05-23 DIAGNOSIS — Z87891 Personal history of nicotine dependence: Secondary | ICD-10-CM

## 2022-05-23 DIAGNOSIS — C679 Malignant neoplasm of bladder, unspecified: Secondary | ICD-10-CM | POA: Diagnosis present

## 2022-05-23 DIAGNOSIS — D638 Anemia in other chronic diseases classified elsewhere: Secondary | ICD-10-CM | POA: Diagnosis present

## 2022-05-23 DIAGNOSIS — Z8041 Family history of malignant neoplasm of ovary: Secondary | ICD-10-CM

## 2022-05-23 DIAGNOSIS — N183 Chronic kidney disease, stage 3 unspecified: Secondary | ICD-10-CM | POA: Insufficient documentation

## 2022-05-23 DIAGNOSIS — Z85828 Personal history of other malignant neoplasm of skin: Secondary | ICD-10-CM

## 2022-05-23 DIAGNOSIS — N179 Acute kidney failure, unspecified: Secondary | ICD-10-CM | POA: Diagnosis not present

## 2022-05-23 DIAGNOSIS — K660 Peritoneal adhesions (postprocedural) (postinfection): Secondary | ICD-10-CM | POA: Diagnosis present

## 2022-05-23 DIAGNOSIS — Z880 Allergy status to penicillin: Secondary | ICD-10-CM

## 2022-05-23 DIAGNOSIS — Z8249 Family history of ischemic heart disease and other diseases of the circulatory system: Secondary | ICD-10-CM

## 2022-05-23 DIAGNOSIS — Z8616 Personal history of COVID-19: Secondary | ICD-10-CM

## 2022-05-23 DIAGNOSIS — K56609 Unspecified intestinal obstruction, unspecified as to partial versus complete obstruction: Secondary | ICD-10-CM

## 2022-05-23 DIAGNOSIS — Z79899 Other long term (current) drug therapy: Secondary | ICD-10-CM

## 2022-05-23 DIAGNOSIS — K219 Gastro-esophageal reflux disease without esophagitis: Secondary | ICD-10-CM | POA: Diagnosis present

## 2022-05-23 DIAGNOSIS — F419 Anxiety disorder, unspecified: Secondary | ICD-10-CM | POA: Diagnosis present

## 2022-05-23 DIAGNOSIS — K567 Ileus, unspecified: Secondary | ICD-10-CM | POA: Diagnosis not present

## 2022-05-23 DIAGNOSIS — I129 Hypertensive chronic kidney disease with stage 1 through stage 4 chronic kidney disease, or unspecified chronic kidney disease: Secondary | ICD-10-CM | POA: Diagnosis present

## 2022-05-23 DIAGNOSIS — Z823 Family history of stroke: Secondary | ICD-10-CM

## 2022-05-23 DIAGNOSIS — E871 Hypo-osmolality and hyponatremia: Secondary | ICD-10-CM | POA: Diagnosis present

## 2022-05-23 DIAGNOSIS — N1831 Chronic kidney disease, stage 3a: Secondary | ICD-10-CM | POA: Diagnosis present

## 2022-05-23 LAB — COMPREHENSIVE METABOLIC PANEL
ALT: 9 U/L (ref 0–44)
AST: 17 U/L (ref 15–41)
Albumin: 3.4 g/dL — ABNORMAL LOW (ref 3.5–5.0)
Alkaline Phosphatase: 80 U/L (ref 38–126)
Anion gap: 12 (ref 5–15)
BUN: 17 mg/dL (ref 8–23)
CO2: 28 mmol/L (ref 22–32)
Calcium: 8.7 mg/dL — ABNORMAL LOW (ref 8.9–10.3)
Chloride: 94 mmol/L — ABNORMAL LOW (ref 98–111)
Creatinine, Ser: 1.41 mg/dL — ABNORMAL HIGH (ref 0.44–1.00)
GFR, Estimated: 42 mL/min — ABNORMAL LOW (ref >60)
Glucose, Bld: 135 mg/dL — ABNORMAL HIGH (ref 70–99)
Potassium: 4.1 mmol/L (ref 3.5–5.1)
Sodium: 134 mmol/L — ABNORMAL LOW (ref 135–145)
Total Bilirubin: 0.6 mg/dL (ref 0.3–1.2)
Total Protein: 6.9 g/dL (ref 6.5–8.1)

## 2022-05-23 LAB — CBC WITH DIFFERENTIAL/PLATELET
Abs Immature Granulocytes: 0.03 10*3/uL (ref 0.00–0.07)
Basophils Absolute: 0 10*3/uL (ref 0.0–0.1)
Basophils Relative: 0 %
Eosinophils Absolute: 0 10*3/uL (ref 0.0–0.5)
Eosinophils Relative: 1 %
HCT: 30.7 % — ABNORMAL LOW (ref 36.0–46.0)
Hemoglobin: 9.8 g/dL — ABNORMAL LOW (ref 12.0–15.0)
Immature Granulocytes: 1 %
Lymphocytes Relative: 12 %
Lymphs Abs: 0.8 10*3/uL (ref 0.7–4.0)
MCH: 34.9 pg — ABNORMAL HIGH (ref 26.0–34.0)
MCHC: 31.9 g/dL (ref 30.0–36.0)
MCV: 109.3 fL — ABNORMAL HIGH (ref 80.0–100.0)
Monocytes Absolute: 0.9 10*3/uL (ref 0.1–1.0)
Monocytes Relative: 14 %
Neutro Abs: 4.6 10*3/uL (ref 1.7–7.7)
Neutrophils Relative %: 72 %
Platelets: 228 10*3/uL (ref 150–400)
RBC: 2.81 MIL/uL — ABNORMAL LOW (ref 3.87–5.11)
RDW: 15.5 % (ref 11.5–15.5)
WBC: 6.4 10*3/uL (ref 4.0–10.5)
nRBC: 0 % (ref 0.0–0.2)

## 2022-05-23 LAB — LIPASE, BLOOD: Lipase: 19 U/L (ref 11–51)

## 2022-05-23 MED ORDER — IOHEXOL 300 MG/ML  SOLN
100.0000 mL | Freq: Once | INTRAMUSCULAR | Status: AC | PRN
  Administered 2022-05-23: 100 mL via INTRAVENOUS

## 2022-05-23 MED ORDER — ONDANSETRON HCL 4 MG/2ML IJ SOLN
4.0000 mg | Freq: Once | INTRAMUSCULAR | Status: AC
  Administered 2022-05-24: 4 mg via INTRAVENOUS
  Filled 2022-05-23: qty 2

## 2022-05-23 MED ORDER — MORPHINE SULFATE (PF) 4 MG/ML IV SOLN
4.0000 mg | Freq: Once | INTRAVENOUS | Status: AC
  Administered 2022-05-23: 4 mg via INTRAVENOUS
  Filled 2022-05-23: qty 1

## 2022-05-23 NOTE — ED Provider Notes (Signed)
[facility] Emergency Department Provider Note MRN:  [id number]  Arrival date & time: 05/24/22     Chief Complaint   Abdominal Pain and Constipation   History of Present Illness   Mary Stark is a 66 y.o. year-old female presents to the ED with chief complaint of abdominal pain.  She states that she has been vomiting for the past 3-4 days.  States that she has been constipated for about a week.  She states that today she began having worsening abdominal pain today.  She states that she has bladder cancer with probable mets to the colon.  She denies fever.  She reports moderate pain.  History provided by patient.   Review of Systems  Pertinent review of systems noted in HPI.    Physical Exam   Vitals:   05/24/22 0100 05/24/22 0200  BP: 121/75 113/74  Pulse: 73 65  Resp: (!) 23 12  Temp:    SpO2: 94% 94%    CONSTITUTIONAL:  uncomfortable, but nontoxic-appearing, NAD NEURO:  Alert and oriented x 3, CN 3-12 grossly intact EYES:  eyes equal and reactive ENT/NECK:  Supple, no stridor  CARDIO:  normal rate, regular rhythm, appears well-perfused  PULM:  No respiratory distress, CTAB GI/GU:  non-distended, TTP of the LLQ MSK/SPINE:  No gross deformities, no edema, moves all extremities  SKIN:  no rash, atraumatic   *Additional and/or pertinent findings included in MDM below  Diagnostic and Interventional Summary    EKG Interpretation  Date/Time:    Ventricular Rate:    PR Interval:    QRS Duration:   QT Interval:    QTC Calculation:   R Axis:     Text Interpretation:         Labs Reviewed  COMPREHENSIVE METABOLIC PANEL - Abnormal; Notable for the following components:      Result Value   Sodium 134 (*)    Chloride 94 (*)    Glucose, Bld 135 (*)    Creatinine, Ser 1.41 (*)    Calcium 8.7 (*)    Albumin 3.4 (*)    GFR, Estimated 42 (*)    All other components within normal limits  CBC WITH DIFFERENTIAL/PLATELET - Abnormal;  Notable for the following components:   RBC 2.81 (*)    Hemoglobin 9.8 (*)    HCT 30.7 (*)    MCV 109.3 (*)    MCH 34.9 (*)    All other components within normal limits  LIPASE, BLOOD  URINALYSIS, ROUTINE W REFLEX MICROSCOPIC    CT ABDOMEN PELVIS W CONTRAST  Final Result      Medications  ondansetron (ZOFRAN) injection 4 mg (has no administration in time range)  morphine (PF) 4 MG/ML injection 4 mg (4 mg Intravenous Given 05/23/22 2245)  ondansetron (ZOFRAN) injection 4 mg (4 mg Intravenous Given 05/24/22 0110)  iohexol (OMNIPAQUE) 300 MG/ML solution 100 mL (100 mLs Intravenous Contrast Given 05/23/22 2327)  morphine (PF) 4 MG/ML injection 4 mg (4 mg Intravenous Given 05/24/22 0110)     Procedures  /  Critical Care Procedures  ED Course and Medical Decision Making  I have reviewed the triage vital signs, the nursing notes, and pertinent available records from the EMR.  Social Determinants Affecting Complexity of Care: Patient has no clinically significant social determinants affecting this chief complaint..   ED Course:   Patient here with abdominal pain.  Top differential diagnoses include SBO, constipation, chronic pain from metastatic disease. Medical Decision Making Amount and/or  Complexity of Data Reviewed Labs: ordered.    Details: no leukocytosis or significant electrolyte derangement Radiology: ordered.    Details: CT concerning for obstruction 2/2 colon cancer  Risk Prescription drug management. Decision regarding hospitalization.     Consultants: I discussed the case with Hospitalist, Dr. Marcello Moores, who is appreciated for admitting. I consulted with Dr. Harlow Asa, who recommends hospitalist admission and will consult in the morning.  Treatment and Plan: Patient's exam and diagnostic results are concerning for SBO.  Feel that patient will need admission to the hospital for further treatment and evaluation.    Final Clinical Impressions(s) / ED Diagnoses      ICD-10-CM   1. SBO (small bowel obstruction) ([place])  K56.609       ED Discharge Orders     None         Discharge Instructions Discussed with and Provided to Patient:   Discharge Instructions   None      Montine Circle, PA-C 05/24/22 [id number]    Charlesetta Shanks, MD 05/24/22 1635

## 2022-05-23 NOTE — ED Triage Notes (Signed)
Ambulatory to ED with c/o abd pain, emesis, and constipation.  States last weekend, she started vomiting for 3-4 days then stopped until yesterday when she started vomiting again. Also has not had bowel movement since 6/4 despite taking multiple OTC medications. Hx of colon cancer.

## 2022-05-24 ENCOUNTER — Inpatient Hospital Stay (HOSPITAL_COMMUNITY): Payer: No Typology Code available for payment source

## 2022-05-24 ENCOUNTER — Encounter (HOSPITAL_COMMUNITY): Payer: Self-pay | Admitting: Internal Medicine

## 2022-05-24 DIAGNOSIS — N1831 Chronic kidney disease, stage 3a: Secondary | ICD-10-CM | POA: Diagnosis present

## 2022-05-24 DIAGNOSIS — I129 Hypertensive chronic kidney disease with stage 1 through stage 4 chronic kidney disease, or unspecified chronic kidney disease: Secondary | ICD-10-CM | POA: Diagnosis present

## 2022-05-24 DIAGNOSIS — Z8616 Personal history of COVID-19: Secondary | ICD-10-CM | POA: Diagnosis not present

## 2022-05-24 DIAGNOSIS — R278 Other lack of coordination: Secondary | ICD-10-CM | POA: Diagnosis not present

## 2022-05-24 DIAGNOSIS — Z888 Allergy status to other drugs, medicaments and biological substances status: Secondary | ICD-10-CM | POA: Diagnosis not present

## 2022-05-24 DIAGNOSIS — G9341 Metabolic encephalopathy: Secondary | ICD-10-CM | POA: Diagnosis not present

## 2022-05-24 DIAGNOSIS — D638 Anemia in other chronic diseases classified elsewhere: Secondary | ICD-10-CM | POA: Diagnosis present

## 2022-05-24 DIAGNOSIS — N289 Disorder of kidney and ureter, unspecified: Secondary | ICD-10-CM | POA: Diagnosis not present

## 2022-05-24 DIAGNOSIS — C786 Secondary malignant neoplasm of retroperitoneum and peritoneum: Secondary | ICD-10-CM

## 2022-05-24 DIAGNOSIS — K56609 Unspecified intestinal obstruction, unspecified as to partial versus complete obstruction: Secondary | ICD-10-CM | POA: Diagnosis present

## 2022-05-24 DIAGNOSIS — R531 Weakness: Secondary | ICD-10-CM | POA: Diagnosis not present

## 2022-05-24 DIAGNOSIS — C799 Secondary malignant neoplasm of unspecified site: Secondary | ICD-10-CM | POA: Diagnosis not present

## 2022-05-24 DIAGNOSIS — C679 Malignant neoplasm of bladder, unspecified: Secondary | ICD-10-CM

## 2022-05-24 DIAGNOSIS — F419 Anxiety disorder, unspecified: Secondary | ICD-10-CM | POA: Diagnosis present

## 2022-05-24 DIAGNOSIS — F32A Depression, unspecified: Secondary | ICD-10-CM | POA: Diagnosis present

## 2022-05-24 DIAGNOSIS — N183 Chronic kidney disease, stage 3 unspecified: Secondary | ICD-10-CM | POA: Insufficient documentation

## 2022-05-24 DIAGNOSIS — Z800 Family history of malignant neoplasm of digestive organs: Secondary | ICD-10-CM | POA: Diagnosis not present

## 2022-05-24 DIAGNOSIS — Z8249 Family history of ischemic heart disease and other diseases of the circulatory system: Secondary | ICD-10-CM | POA: Diagnosis not present

## 2022-05-24 DIAGNOSIS — Z87891 Personal history of nicotine dependence: Secondary | ICD-10-CM | POA: Diagnosis not present

## 2022-05-24 DIAGNOSIS — N1330 Unspecified hydronephrosis: Secondary | ICD-10-CM | POA: Diagnosis present

## 2022-05-24 DIAGNOSIS — Z880 Allergy status to penicillin: Secondary | ICD-10-CM | POA: Diagnosis not present

## 2022-05-24 DIAGNOSIS — Z85828 Personal history of other malignant neoplasm of skin: Secondary | ICD-10-CM | POA: Diagnosis not present

## 2022-05-24 DIAGNOSIS — N179 Acute kidney failure, unspecified: Secondary | ICD-10-CM | POA: Diagnosis not present

## 2022-05-24 DIAGNOSIS — Z8041 Family history of malignant neoplasm of ovary: Secondary | ICD-10-CM | POA: Diagnosis not present

## 2022-05-24 DIAGNOSIS — K660 Peritoneal adhesions (postprocedural) (postinfection): Secondary | ICD-10-CM | POA: Diagnosis present

## 2022-05-24 DIAGNOSIS — I10 Essential (primary) hypertension: Secondary | ICD-10-CM | POA: Diagnosis not present

## 2022-05-24 DIAGNOSIS — E785 Hyperlipidemia, unspecified: Secondary | ICD-10-CM | POA: Diagnosis present

## 2022-05-24 DIAGNOSIS — E871 Hypo-osmolality and hyponatremia: Secondary | ICD-10-CM | POA: Diagnosis present

## 2022-05-24 DIAGNOSIS — K219 Gastro-esophageal reflux disease without esophagitis: Secondary | ICD-10-CM | POA: Diagnosis present

## 2022-05-24 DIAGNOSIS — K759 Inflammatory liver disease, unspecified: Secondary | ICD-10-CM | POA: Diagnosis not present

## 2022-05-24 DIAGNOSIS — K6389 Other specified diseases of intestine: Secondary | ICD-10-CM | POA: Diagnosis not present

## 2022-05-24 DIAGNOSIS — K567 Ileus, unspecified: Secondary | ICD-10-CM | POA: Diagnosis not present

## 2022-05-24 DIAGNOSIS — C785 Secondary malignant neoplasm of large intestine and rectum: Secondary | ICD-10-CM | POA: Diagnosis present

## 2022-05-24 DIAGNOSIS — F1021 Alcohol dependence, in remission: Secondary | ICD-10-CM | POA: Diagnosis present

## 2022-05-24 LAB — CREATININE, SERUM
Creatinine, Ser: 1.18 mg/dL — ABNORMAL HIGH (ref 0.44–1.00)
GFR, Estimated: 52 mL/min — ABNORMAL LOW (ref >60)

## 2022-05-24 LAB — COMPREHENSIVE METABOLIC PANEL
ALT: 10 U/L (ref 0–44)
AST: 16 U/L (ref 15–41)
Albumin: 3.4 g/dL — ABNORMAL LOW (ref 3.5–5.0)
Alkaline Phosphatase: 80 U/L (ref 38–126)
Anion gap: 12 (ref 5–15)
BUN: 18 mg/dL (ref 8–23)
CO2: 30 mmol/L (ref 22–32)
Calcium: 8.7 mg/dL — ABNORMAL LOW (ref 8.9–10.3)
Chloride: 92 mmol/L — ABNORMAL LOW (ref 98–111)
Creatinine, Ser: 1.32 mg/dL — ABNORMAL HIGH (ref 0.44–1.00)
GFR, Estimated: 45 mL/min — ABNORMAL LOW (ref >60)
Glucose, Bld: 121 mg/dL — ABNORMAL HIGH (ref 70–99)
Potassium: 3.9 mmol/L (ref 3.5–5.1)
Sodium: 134 mmol/L — ABNORMAL LOW (ref 135–145)
Total Bilirubin: 0.7 mg/dL (ref 0.3–1.2)
Total Protein: 6.7 g/dL (ref 6.5–8.1)

## 2022-05-24 LAB — URINALYSIS, ROUTINE W REFLEX MICROSCOPIC
Bacteria, UA: NONE SEEN
Bilirubin Urine: NEGATIVE
Glucose, UA: NEGATIVE mg/dL
Ketones, ur: NEGATIVE mg/dL
Nitrite: NEGATIVE
Protein, ur: NEGATIVE mg/dL
Specific Gravity, Urine: 1.04 — ABNORMAL HIGH (ref 1.005–1.030)
pH: 6 (ref 5.0–8.0)

## 2022-05-24 LAB — CBC WITH DIFFERENTIAL/PLATELET
Abs Immature Granulocytes: 0.04 K/uL (ref 0.00–0.07)
Basophils Absolute: 0 K/uL (ref 0.0–0.1)
Basophils Relative: 0 %
Eosinophils Absolute: 0 K/uL (ref 0.0–0.5)
Eosinophils Relative: 1 %
HCT: 29.1 % — ABNORMAL LOW (ref 36.0–46.0)
Hemoglobin: 9.5 g/dL — ABNORMAL LOW (ref 12.0–15.0)
Immature Granulocytes: 1 %
Lymphocytes Relative: 15 %
Lymphs Abs: 0.9 K/uL (ref 0.7–4.0)
MCH: 35.7 pg — ABNORMAL HIGH (ref 26.0–34.0)
MCHC: 32.6 g/dL (ref 30.0–36.0)
MCV: 109.4 fL — ABNORMAL HIGH (ref 80.0–100.0)
Monocytes Absolute: 1.1 K/uL — ABNORMAL HIGH (ref 0.1–1.0)
Monocytes Relative: 19 %
Neutro Abs: 3.9 K/uL (ref 1.7–7.7)
Neutrophils Relative %: 64 %
Platelets: 214 K/uL (ref 150–400)
RBC: 2.66 MIL/uL — ABNORMAL LOW (ref 3.87–5.11)
RDW: 15.4 % (ref 11.5–15.5)
WBC: 6 K/uL (ref 4.0–10.5)
nRBC: 0 % (ref 0.0–0.2)

## 2022-05-24 LAB — HIV ANTIBODY (ROUTINE TESTING W REFLEX): HIV Screen 4th Generation wRfx: NONREACTIVE

## 2022-05-24 LAB — TSH: TSH: 3.336 u[IU]/mL (ref 0.350–4.500)

## 2022-05-24 LAB — GLUCOSE, CAPILLARY: Glucose-Capillary: 117 mg/dL — ABNORMAL HIGH (ref 70–99)

## 2022-05-24 MED ORDER — ACETAMINOPHEN 500 MG PO TABS
1000.0000 mg | ORAL_TABLET | ORAL | Status: AC
  Administered 2022-05-25: 1000 mg via ORAL
  Filled 2022-05-24: qty 2

## 2022-05-24 MED ORDER — ACETAMINOPHEN 10 MG/ML IV SOLN: 1000.0000 mg | Freq: Three times a day (TID) | INTRAVENOUS | Status: AC | PRN

## 2022-05-24 MED ORDER — HEPARIN SODIUM (PORCINE) 5000 UNIT/ML IJ SOLN
5000.0000 [IU] | Freq: Three times a day (TID) | INTRAMUSCULAR | Status: DC
  Administered 2022-05-24 (×2): 5000 [IU] via SUBCUTANEOUS
  Filled 2022-05-24 (×2): qty 1

## 2022-05-24 MED ORDER — SODIUM CHLORIDE 0.9 % IV SOLN: INTRAVENOUS | Status: DC

## 2022-05-24 MED ORDER — MORPHINE SULFATE (PF) 4 MG/ML IV SOLN
4.0000 mg | Freq: Once | INTRAVENOUS | Status: AC
  Administered 2022-05-24: 4 mg via INTRAVENOUS
  Filled 2022-05-24: qty 1

## 2022-05-24 MED ORDER — BUPIVACAINE LIPOSOME 1.3 % IJ SUSP: 20.0000 mL | INTRAMUSCULAR | Status: DC | PRN

## 2022-05-24 MED ORDER — SODIUM CHLORIDE 0.9 % IV SOLN
2.0000 g | INTRAVENOUS | Status: AC
  Administered 2022-05-25: 2 g via INTRAVENOUS
  Filled 2022-05-24 (×2): qty 2

## 2022-05-24 MED ORDER — ALBUTEROL SULFATE (2.5 MG/3ML) 0.083% IN NEBU
2.5000 mg | INHALATION_SOLUTION | Freq: Four times a day (QID) | RESPIRATORY_TRACT | Status: DC | PRN
Start: 2022-05-24 — End: 2022-05-30
  Administered 2022-05-25: 2.5 mg via RESPIRATORY_TRACT
  Filled 2022-05-24: qty 3

## 2022-05-24 MED ORDER — PROCHLORPERAZINE EDISYLATE 10 MG/2ML IJ SOLN
10.0000 mg | Freq: Four times a day (QID) | INTRAMUSCULAR | Status: DC | PRN
  Administered 2022-05-24: 10 mg via INTRAVENOUS
  Filled 2022-05-24: qty 2

## 2022-05-24 MED ORDER — ENSURE PRE-SURGERY PO LIQD
296.0000 mL | Freq: Once | ORAL | Status: AC
  Administered 2022-05-25: 296 mL via ORAL
  Filled 2022-05-24: qty 296

## 2022-05-24 MED ORDER — METRONIDAZOLE 500 MG PO TABS
1000.0000 mg | ORAL_TABLET | ORAL | Status: DC
  Administered 2022-05-24: 1000 mg via ORAL
  Filled 2022-05-24: qty 2

## 2022-05-24 MED ORDER — NEOMYCIN SULFATE 500 MG PO TABS
1000.0000 mg | ORAL_TABLET | ORAL | Status: DC
  Administered 2022-05-24: 1000 mg via ORAL
  Filled 2022-05-24 (×2): qty 2

## 2022-05-24 MED ORDER — PANTOPRAZOLE SODIUM 40 MG IV SOLR
40.0000 mg | INTRAVENOUS | Status: DC
  Administered 2022-05-24 – 2022-05-28 (×5): 40 mg via INTRAVENOUS
  Filled 2022-05-24 (×5): qty 10

## 2022-05-24 MED ORDER — ALVIMOPAN 12 MG PO CAPS
12.0000 mg | ORAL_CAPSULE | ORAL | Status: AC
  Administered 2022-05-25: 12 mg via ORAL
  Filled 2022-05-24 (×3): qty 1

## 2022-05-24 MED ORDER — CYCLOSPORINE 0.05 % OP EMUL
1.0000 [drp] | Freq: Every day | OPHTHALMIC | Status: DC
  Administered 2022-05-24 – 2022-05-29 (×5): 1 [drp] via OPHTHALMIC
  Filled 2022-05-24 (×7): qty 30

## 2022-05-24 MED ORDER — BISACODYL 10 MG RE SUPP: 10.0000 mg | Freq: Every day | RECTAL | Status: DC

## 2022-05-24 MED ORDER — ALBUTEROL SULFATE HFA 108 (90 BASE) MCG/ACT IN AERS: 2.0000 | INHALATION_SPRAY | Freq: Four times a day (QID) | RESPIRATORY_TRACT | Status: DC | PRN

## 2022-05-24 MED ORDER — CHLORHEXIDINE GLUCONATE CLOTH 2 % EX PADS
6.0000 | MEDICATED_PAD | Freq: Every day | CUTANEOUS | Status: DC
  Administered 2022-05-24: 6 via TOPICAL

## 2022-05-24 MED ORDER — POLYETHYLENE GLYCOL 3350 17 G PO PACK: 17.0000 g | PACK | Freq: Once | ORAL | Status: DC

## 2022-05-24 MED ORDER — ENSURE PRE-SURGERY PO LIQD
592.0000 mL | Freq: Once | ORAL | Status: AC
  Administered 2022-05-24: 592 mL via ORAL
  Filled 2022-05-24 (×2): qty 592

## 2022-05-24 MED ORDER — HYDROMORPHONE HCL 1 MG/ML IJ SOLN
1.0000 mg | INTRAMUSCULAR | Status: DC | PRN
  Administered 2022-05-25: 1 mg via INTRAVENOUS
  Administered 2022-05-25: .4 mg via INTRAVENOUS
  Administered 2022-05-25: 1 mg via INTRAVENOUS
  Filled 2022-05-24 (×3): qty 1

## 2022-05-24 MED ORDER — PROMETHAZINE HCL 25 MG PO TABS
25.0000 mg | ORAL_TABLET | Freq: Four times a day (QID) | ORAL | Status: DC | PRN
Start: 2022-05-24 — End: 2022-05-25
  Administered 2022-05-24: 25 mg via ORAL
  Filled 2022-05-24: qty 1

## 2022-05-24 MED ORDER — HYDROCODONE-ACETAMINOPHEN 5-325 MG PO TABS: 1.0000 | ORAL_TABLET | ORAL | Status: DC | PRN

## 2022-05-24 MED ORDER — ONDANSETRON HCL 4 MG/2ML IJ SOLN
4.0000 mg | Freq: Once | INTRAMUSCULAR | Status: DC
  Filled 2022-05-24: qty 2

## 2022-05-24 MED ORDER — METRONIDAZOLE 500 MG PO TABS: 1000.0000 mg | ORAL_TABLET | ORAL | Status: DC

## 2022-05-24 MED ORDER — NEOMYCIN SULFATE 500 MG PO TABS: 1000.0000 mg | ORAL_TABLET | ORAL | Status: DC

## 2022-05-24 MED ORDER — LORAZEPAM 2 MG/ML IJ SOLN
0.5000 mg | Freq: Three times a day (TID) | INTRAMUSCULAR | Status: DC | PRN
Start: 2022-05-24 — End: 2022-05-30
  Administered 2022-05-27: 0.5 mg via INTRAVENOUS
  Filled 2022-05-24: qty 1

## 2022-05-24 MED ORDER — ONDANSETRON HCL 4 MG PO TABS: 4.0000 mg | ORAL_TABLET | Freq: Four times a day (QID) | ORAL | Status: DC | PRN

## 2022-05-24 MED ORDER — ONDANSETRON HCL 4 MG/2ML IJ SOLN
4.0000 mg | Freq: Four times a day (QID) | INTRAMUSCULAR | Status: DC | PRN
  Administered 2022-05-24: 4 mg via INTRAVENOUS

## 2022-05-24 MED ORDER — ENOXAPARIN SODIUM 40 MG/0.4ML IJ SOSY
40.0000 mg | PREFILLED_SYRINGE | Freq: Once | INTRAMUSCULAR | Status: AC
  Administered 2022-05-25: 40 mg via SUBCUTANEOUS
  Filled 2022-05-24: qty 0.4

## 2022-05-24 MED ORDER — HEPARIN SODIUM (PORCINE) 5000 UNIT/ML IJ SOLN
5000.0000 [IU] | Freq: Three times a day (TID) | INTRAMUSCULAR | Status: AC
  Administered 2022-05-24: 5000 [IU] via SUBCUTANEOUS
  Filled 2022-05-24: qty 1

## 2022-05-24 MED ORDER — CEFTRIAXONE SODIUM 1 G IJ SOLR
1.0000 g | INTRAMUSCULAR | Status: DC
  Administered 2022-05-24 – 2022-05-28 (×5): 1 g via INTRAVENOUS
  Filled 2022-05-24 (×5): qty 10

## 2022-05-24 MED ORDER — HEPARIN SODIUM (PORCINE) 5000 UNIT/ML IJ SOLN
5000.0000 [IU] | Freq: Three times a day (TID) | INTRAMUSCULAR | Status: DC
Start: 2022-05-26 — End: 2022-05-30
  Administered 2022-05-26 – 2022-05-30 (×13): 5000 [IU] via SUBCUTANEOUS
  Filled 2022-05-24 (×12): qty 1

## 2022-05-24 MED ORDER — GABAPENTIN 300 MG PO CAPS
300.0000 mg | ORAL_CAPSULE | ORAL | Status: AC
  Administered 2022-05-25: 300 mg via ORAL
  Filled 2022-05-24: qty 1

## 2022-05-24 NOTE — Progress Notes (Signed)
Patient's office computer chart and hospital computer chart reviewed and case discussed with Dr. 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

## 2022-05-24 NOTE — Consult Note (Addendum)
Reason for Consult:  colonic obstruction  Referring Physician: Dr. Marshia Ly [facility]  Mary Stark is an 66 y.o. female.  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  Patient's urologist is Dr. Claudia Desanctis.  Patient's medical oncologist is Dr. Alen Blew.  Patient has had several weeks of progressive abdominal discomfort, abdominal distention, and decreased bowel movements.  About 2 weeks ago she underwent colonoscopy at [facility] gastroenterology by Dr. Wonda Horner.  She was found to have a circumferential nearly obstructive lesion in the distal ascending colon.  Biopsies were taken but results are not available through [facility] epic.  Based on the description, this appeared to be an extrinsic lesion which was submucosal and did not appear to be a colonic primary.  Patient had a small bowel movement yesterday but had not had a bowel movement for a week prior to that.  Patient developed nausea and emesis.  She presented to the emergency room for evaluation last night.  CT scan shows an obstructing circumferential lesion in the distal ascending colon.  Patient was admitted to the medical service and general surgery was consulted for management.  Prior abdominal surgery includes cesarean section.  Patient is on chemotherapy.  Her last chemotherapy cycle was 3 weeks ago.  Past Medical History:  Diagnosis Date   Adenocarcinoma of bladder, stage 4 ([place]) 10/2020   oncologist--- dr Alen Blew and urologist-- dr pace;  w/ mets , started chemo 12/ 2021 and bilateral ureteral stents to treat hydronephrosis   Alcohol dependence ([place])    drank heavily years ago doesn't now 10/22/2021   Anemia due to chemotherapy    Anxiety    Arthritis    Back and lt knee   CKD (chronic kidney disease), stage III [facility])    COVID    August 2021, was in hospital and was on Oxygen for 11 weeks lungs still look like glass. 10/22/2021   Family history of  adverse reaction to anesthesia    father had a "tight" airway   GERD (gastroesophageal reflux disease)    History of 2019 novel coronavirus disease (COVID-19) 08/13/2020   positive result in epic 08-19-2020,  hospital admission in epic,  covid pneumonia with hypoxia respiratory failure, discharged with oxygen for 6 weeks   History of chemotherapy last done 05-13-2021   History of colitis 03/2018   acute colitis with sepsis   History of hepatitis C    TX FOR 2012   History of kidney stones    History of rheumatic fever as a child    per pt no valvular issues   History of sepsis 10/2020   hospital admission in epic due to pyelonephritis due to hydronephrosis, resolved   History of skin cancer    excision from nose   Hyperlipidemia    Hypertension    MDD (major depressive disorder)    Pneumonia    august 2022   Seasonal allergies    Sepsis ([place])    uro sepsis June 2022   Vitamin D deficiency     Past Surgical History:  Procedure Laterality Date   ANAL RECTAL MANOMETRY N/A 01/31/2020   Procedure: ANO RECTAL MANOMETRY;  Surgeon: Arta Silence, MD;  Location: [facility];  Service: Endoscopy;  Laterality: N/A;   CESAREAN SECTION  1984   CYSTOSCOPY W/ URETERAL STENT PLACEMENT Bilateral 01/21/2021   Procedure: CYSTOSCOPY WITH STENT REPLACEMENT;  Surgeon: Robley Fries, MD;  Location: [place];  Service: Urology;  Laterality: Bilateral;  1 HR   CYSTOSCOPY W/ URETERAL STENT PLACEMENT Bilateral 05/20/2021   Procedure: CYSTOSCOPY WITH RETROGRADE PYELOGRAM/URETERAL STENT EXCHANGE;  Surgeon: Robley Fries, MD;  Location: [facility];  Service: Urology;  Laterality: Bilateral;   CYSTOSCOPY W/ URETERAL STENT PLACEMENT Bilateral 10/28/2021   Procedure: CYSTOSCOPY WITH RETROGRADE PYELOGRAM/URETERAL STENT EXCHANGE;  Surgeon: Robley Fries, MD;  Location: [facility];  Service: Urology;  Laterality: Bilateral;   CYSTOSCOPY W/ URETERAL STENT  PLACEMENT Bilateral 04/28/2022   Procedure: CYSTOSCOPY Wyvonnia Dusky STENT EXCHANGE;  Surgeon: Robley Fries, MD;  Location[facility];  Service: Urology;  Laterality: Bilateral;  1 HR   CYSTOSCOPY WITH RETROGRADE PYELOGRAM, URETEROSCOPY AND STENT PLACEMENT Bilateral 06/11/2020   Procedure: CYSTOSCOPY WITH RETROGRADE PYELOGRAM, URETEROSCOPY AND STENT PLACEMENT, RIGHT URETERAL BIOPSY;  Surgeon: Robley Fries, MD;  Location: [facility];  Service: Urology;  Laterality: Bilateral;  53 MINS   CYSTOSCOPY WITH RETROGRADE PYELOGRAM, URETEROSCOPY AND STENT PLACEMENT Bilateral 07/05/2020   Procedure: CYSTOSCOPY WITH RETROGRADE PYELOGRAM, URETEROSCOPY,  BIOPSIES AND STENT EXCHANGES;  Surgeon: Robley Fries, MD;  Location: [facility];  Service: Urology;  Laterality: Bilateral;   FINGER SURGERY Right    5th   GYNECOLOGIC CRYOSURGERY  YRS AGO   IR IMAGING GUIDED PORT INSERTION  11/18/2020   TONSILLECTOMY  AS CHILD   TRANSURETHRAL RESECTION OF BLADDER TUMOR  01/21/2021   Procedure: TRANSURETHRAL RESECTION OF BLADDER TUMOR (TURBT);  Surgeon: Robley Fries, MD;  Location: [facility];  Service: Urology;;    Family History  Problem Relation Age of Onset   Ovarian cancer Mother    Cardiomyopathy Father    Valvular heart disease Father    Liver cancer Brother    Protein C deficiency Brother    Protein C deficiency Brother    Stroke Brother     Social History:  reports that she quit smoking about 11 years ago. Her smoking use included cigarettes. She has a 25.00 pack-year smoking history. She has never used smokeless tobacco. She reports current alcohol use. She reports that she does not use drugs.  Allergies:  Allergies  Allergen Reactions   Metformin And Related Other (See Comments)    Lactic acidosis    Penicillins Hives and Rash    Reports hives to penicillin and amoxicillin as an adult "a long time ago." Has tolerated cefdinir (09/2020) and cefepime  (10/2020) before.   Metformin Diarrhea    Extreme diarrhea and lactic acidosis   Penicillin G Sodium Hives and Rash    Medications: I have reviewed the patient's current medications.  Results for orders placed or performed during the hospital encounter of 05/23/22 (from the past 48 hour(s))  Lipase, blood     Status: None   Collection Time: 05/23/22 10:50 PM  Result Value Ref Range   Lipase 19 11 - 51 U/L    Comment: Performed at [facility], [place] [address] [place], [place] [address]  Comprehensive metabolic panel     Status: Abnormal   Collection Time: 05/23/22 10:50 PM  Result Value Ref Range   Sodium 134 (L) 135 - 145 mmol/L   Potassium 4.1 3.5 - 5.1 mmol/L   Chloride 94 (L) 98 - 111 mmol/L   CO2 28 22 - 32 mmol/L   Glucose, Bld 135 (H) 70 - 99 mg/dL    Comment: Glucose reference range applies only to samples  taken after fasting for at least 8 hours.   BUN 17 8 - 23 mg/dL   Creatinine, Ser 1.41 (H) 0.44 - 1.00 mg/dL   Calcium 8.7 (L) 8.9 - 10.3 mg/dL   Total Protein 6.9 6.5 - 8.1 g/dL   Albumin 3.4 (L) 3.5 - 5.0 g/dL   AST 17 15 - 41 U/L   ALT 9 0 - 44 U/L   Alkaline Phosphatase 80 38 - 126 U/L   Total Bilirubin 0.6 0.3 - 1.2 mg/dL   GFR, Estimated 42 (L) >60 mL/min    Comment: (NOTE) Calculated using the CKD-EPI Creatinine Equation (2021)    Anion gap 12 5 - 15    Comment: Performed at [facility], [place] [address], [place], [place] [address]  CBC with Differential     Status: Abnormal   Collection Time: 05/23/22 10:50 PM  Result Value Ref Range   WBC 6.4 4.0 - 10.5 K/uL   RBC 2.81 (L) 3.87 - 5.11 MIL/uL   Hemoglobin 9.8 (L) 12.0 - 15.0 g/dL   HCT 30.7 (L) 36.0 - 46.0 %   MCV 109.3 (H) 80.0 - 100.0 fL   MCH 34.9 (H) 26.0 - 34.0 pg   MCHC 31.9 30.0 - 36.0 g/dL   RDW 15.5 11.5 - 15.5 %   Platelets 228 150 - 400 K/uL   nRBC 0.0 0.0 - 0.2 %   Neutrophils Relative % 72 %   Neutro Abs 4.6 1.7 - 7.7 K/uL   Lymphocytes Relative  12 %   Lymphs Abs 0.8 0.7 - 4.0 K/uL   Monocytes Relative 14 %   Monocytes Absolute 0.9 0.1 - 1.0 K/uL   Eosinophils Relative 1 %   Eosinophils Absolute 0.0 0.0 - 0.5 K/uL   Basophils Relative 0 %   Basophils Absolute 0.0 0.0 - 0.1 K/uL   Immature Granulocytes 1 %   Abs Immature Granulocytes 0.03 0.00 - 0.07 K/uL    Comment: Performed at [facility], [place] [address] [place],  [address]  Urinalysis, Routine w reflex microscopic     Status: Abnormal   Collection Time: 05/24/22  1:24 AM  Result Value Ref Range   Color, Urine YELLOW YELLOW   APPearance CLEAR CLEAR   Specific Gravity, Urine 1.040 (H) 1.005 - 1.030   pH 6.0 5.0 - 8.0   Glucose, UA NEGATIVE NEGATIVE mg/dL   Hgb urine dipstick SMALL (A) NEGATIVE   Bilirubin Urine NEGATIVE NEGATIVE   Ketones, ur NEGATIVE NEGATIVE mg/dL   Protein, ur NEGATIVE NEGATIVE mg/dL   Nitrite NEGATIVE NEGATIVE   Leukocytes,Ua LARGE (A) NEGATIVE   RBC / HPF 11-20 0 - 5 RBC/hpf   WBC, UA 21-50 0 - 5 WBC/hpf   Bacteria, UA NONE SEEN NONE SEEN   Squamous Epithelial / LPF 0-5 0 - 5    Comment: Performed at [facility], [place] [address] [place],  [address]  CBC with Differential/Platelet     Status: Abnormal   Collection Time: 05/24/22  6:59 AM  Result Value Ref Range   WBC 6.0 4.0 - 10.5 K/uL   RBC 2.66 (L) 3.87 - 5.11 MIL/uL   Hemoglobin 9.5 (L) 12.0 - 15.0 g/dL   HCT 29.1 (L) 36.0 - 46.0 %   MCV 109.4 (H) 80.0 - 100.0 fL   MCH 35.7 (H) 26.0 - 34.0 pg   MCHC 32.6 30.0 - 36.0 g/dL   RDW 15.4 11.5 - 15.5 %   Platelets 214 150 -  400 K/uL   nRBC 0.0 0.0 - 0.2 %   Neutrophils Relative % 64 %   Neutro Abs 3.9 1.7 - 7.7 K/uL   Lymphocytes Relative 15 %   Lymphs Abs 0.9 0.7 - 4.0 K/uL   Monocytes Relative 19 %   Monocytes Absolute 1.1 (H) 0.1 - 1.0 K/uL   Eosinophils Relative 1 %   Eosinophils Absolute 0.0 0.0 - 0.5 K/uL   Basophils Relative 0 %   Basophils Absolute 0.0 0.0 - 0.1 K/uL    Immature Granulocytes 1 %   Abs Immature Granulocytes 0.04 0.00 - 0.07 K/uL    Comment: Performed at [facility], [place] [address] [place], [place] [address]    CT ABDOMEN PELVIS W CONTRAST  Result Date: 05/23/2022 CLINICAL DATA:  Abdominal pain, acute, nonlocalized EXAM: CT ABDOMEN AND PELVIS WITH CONTRAST TECHNIQUE: Multidetector CT imaging of the abdomen and pelvis was performed using the standard protocol following bolus administration of intravenous contrast. RADIATION DOSE REDUCTION: This exam was performed according to the departmental dose-optimization program which includes automated exposure control, adjustment of the mA and/or kV according to patient size and/or use of iterative reconstruction technique. CONTRAST:  129m OMNIPAQUE IOHEXOL 300 MG/ML  SOLN COMPARISON:  04/21/2022 FINDINGS: Lower chest: No acute abnormality Hepatobiliary: No focal hepatic abnormality. Gallbladder unremarkable. Pancreas: No focal abnormality or ductal dilatation. Spleen: No focal abnormality.  Normal size. Adrenals/Urinary Tract: Bilateral ureteral stents are in place, as seen on prior study. Moderate left hydronephrosis is stable. No hydronephrosis on the right. Scarring and cortical thinning in the upper pole of the left kidney. No adrenal mass. Urinary bladder unremarkable. Stomach/Bowel: Again noted is the area of circumferential wall thickening in the distal ascending colon near the hepatic flexure as seen on prior imaging. This appears to have worsened since prior study. Now, there is significant distention of the cecum and ascending colon proximal to this lesion, and small bowel loops are dilated and fluid-filled. Findings are suspicious for obstructing colon cancer. Vascular/Lymphatic: Aortic atherosclerosis. No evidence of aneurysm or adenopathy. Small scattered retroperitoneal lymph nodes again noted, slightly improved since prior study. Reproductive: Uterus and adnexa unremarkable.  No  mass. Other: Mild ascites, best seen adjacent to the liver and in the right paracolic gutter. Musculoskeletal: No acute bony abnormality. IMPRESSION: Worsening circumferential irregular wall thickening in the distal ascending colon near the hepatic flexure which is now causing obstruction with dilated cecum and ascending colon proximal to this lesion as well as dilated fluid-filled small bowel loops. 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 Bilateral ureteral stents remain in place with moderate left hydronephrosis, unchanged since prior study. Improving right hydronephrosis. Mild ascites. Electronically Signed   By: KRolm BaptiseM.D.   On: 05/23/2022 23:43    Review of Systems  Constitutional:  Positive for appetite change.  HENT: Negative.    Eyes: Negative.   Respiratory:  Positive for chest tightness and shortness of breath.   Cardiovascular: Negative.   Gastrointestinal:  Positive for abdominal distention, abdominal pain, constipation, nausea and vomiting.  Endocrine: Negative.   Genitourinary: Negative.   Musculoskeletal: Negative.   Skin: Negative.   Allergic/Immunologic: Negative.   Neurological: Negative.   Hematological: Negative.   Psychiatric/Behavioral: Negative.     Physical Exam  Blood pressure 128/78, pulse 69, temperature 98.1 F (36.7 C), temperature source Oral, resp. rate 18, height '5\' 6"'$  (1.676 m), weight 77.1 kg, SpO2 98 %.  CONSTITUTIONAL: no acute distress; conversant; no obvious deformities  EYES: Conjunctiva clear  and moist; pupils equal bilaterally  NECK: trachea midline; no thyroid nodularity; infusion port right upper chest wall with catheter into the right internal jugular vein  LUNGS: respiratory effort normal & unlabored; no wheeze; no rales; breath sounds clear bilaterally  CV: rate and rhythm regular; no significant murmur; no edema bilat lower extremities  GI: abdomen is protuberant, mildly tense, mild diffuse tenderness to palpation,  no masses; normal bowel sounds are present  MSK: normal range of motion of extremities; no clubbing; no cyanosis  PSYCH: appropriate affect for situation; alert and oriented to person, place, & time  LYMPHATIC: no palpable cervical lymphadenopathy    Assessment/Plan:  Malignant neoplasm of the ureter, with metastasis  No surgery, bilateral stents in place - Dr. Claudia Desanctis  On chemotherapy - Dr. Alen Blew Colonic obstruction, likely from extrinsic tumor  Colonoscopy by Dr. Wonda Horner  Biopsies taken 5/25 - at [facility] - will need to obtain  Allow clear liquid diet for now  Patient is admitted to the medical service with colonic obstruction secondary to likely metastatic urologic malignancy involving the distal ascending colon.  Patient will likely require resection during this admission.  Our hospital physician, Dr. Donnie Mesa, will evaluate the patient tomorrow.  Patient will need to be seen by her medical oncologist, Dr. Alen Blew, and her urologist, Dr. Claudia Desanctis, in order to plan surgical intervention.  We also need to obtain the results of the biopsies which were taken at the time of her colonoscopy 2 weeks ago.  Patient inquired about the possibility of stent placement.  Based on the colonoscopy report, I think this is unlikely but we will asked Dr. Paulita Fujita to comment.  All of the above was discussed with the patient who understands and agrees to proceed.  Armandina Gemma, [facility] [place] [facility] Office: [phone]   Armandina Gemma 05/24/2022, 7:50 AM

## 2022-05-24 NOTE — Progress Notes (Addendum)
Ysidro Evert the night Tech was asked to do a EKG and CBG on [place]. SCD's ordered.

## 2022-05-24 NOTE — Consult Note (Signed)
[place] Nurse ostomy consult note [place] Nurse requested for preoperative stoma site marking  Noted is order for preoperative stoma site selection and marking by Dr. Johney Maine.  Patient is tentatively  posted for proximal right colectomy with probable need for end ileostomy versus mediated ileocolonic anastomosis by Dr. Georgette Dover tomorrow.   My associate, Jerilynn Mages. Liane Comber is notified this evening of the marking and will be in to see the patient in the morning.  Thank you for inviting [place] to participate in this patient's Plan of Care.  Maudie Flakes, MSN, RN, CNS, [place], Serita Grammes, [employer], [employer] phone:  [phone]

## 2022-05-24 NOTE — Progress Notes (Signed)
Patient arrived from ED via wheelchair. Patient ambulated from chair to bed with minimal assistance. Patient complains of nausea. Patient settled into room, Assessment completed, will continue to monitor according to orders and plan of care.

## 2022-05-24 NOTE — Progress Notes (Addendum)
We will tentatively post for proximal right colectomy with probable need for end ileostomy versus ileocolonic anastomosis by Dr. Georgette Dover tomorrow.    Not a candidate for stenting.    Not a candidate for bowel prep.  Try and give oral antibiotics just in case.    See if wound ostomy nurses can mark the patient tomorrow morning preoperatively just in case   May need to postpone or alter the plan depending on multidisciplinary discussion Monday AM

## 2022-05-24 NOTE — Significant Event (Signed)
Rapid Response Event Note   Reason for Call :  Mental status change  Initial Focused Assessment:  Patient upright in bed on 2L [place]. Patient RN reported neuro status change; husband bedside stated patient was unable to hold a cup of ice water at about 1700. Patient able to correctly answer name and date of birth. At time of initial assessment time last known well was not able to be determined. NIH score 7; see stroke assessment documentation. Code Stroke called, patient transported to CT. Patient assessed by tele-neurologist upon return to room. Encephalopathy suspected, patient stable upon rapid response departure. Advised bedside RN to call with any additional concerns   Interventions:  Code Stroke called, CT scan completed, tele-neuro consult completed  Plan of Care:  Encephalopathy suspected, patient to remain at current level of care.    Event Summary:   MD Notified: Pahwani Call Time: 2761 Arrival Time: 1815 End Time: 1910  Cyndie Chime, RN

## 2022-05-24 NOTE — Progress Notes (Signed)
IP PROGRESS NOTE  Subjective:   Patient known to me with advanced adenocarcinoma of the bladder with metastatic disease to pelvic lymph nodes.  She developed a colonic mass with biopsy-proven to be likely consistent with metastatic disease rather than a primary colon malignancy.  Patient hospitalized overnight with colonic obstruction symptoms of nausea and vomiting and abdominal pain.  Clinically this morning she still uncomfortable although it does not report any vomiting.  She denies any fevers or chills or sweats.  Objective:  Vital signs in last 24 hours: Temp:  [97.8 F (36.6 C)-98.4 F (36.9 C)] 98.4 F (36.9 C) (06/11 0815) Pulse Rate:  [65-82] 72 (06/11 0815) Resp:  [12-23] 16 (06/11 0815) BP: (113-130)/(62-79) 115/62 (06/11 0815) SpO2:  [87 %-98 %] 89 % (06/11 0815) Weight:  [170 lb (77.1 kg)] 170 lb (77.1 kg) (06/10 2139) Weight change:  Last BM Date : 05/23/22  Intake/Output from previous day:   General appearance: Comfortable appearing without any discomfort Head: Normocephalic without any trauma Oropharynx: Mucous membranes are moist and pink without any thrush or ulcers. Eyes: Pupils are equal and round reactive to light. Lymph nodes: No cervical, supraclavicular, inguinal or axillary lymphadenopathy.   Heart:regular rate and rhythm.  S1 and S2 without leg edema. Lung: Clear without any rhonchi or wheezes.  No dullness to percussion. Abdomin: Soft, nontender, nondistended with good bowel sounds.  No hepatosplenomegaly. Musculoskeletal: No joint deformity or effusion.  Full range of motion noted. Neurological: No deficits noted on motor, sensory and deep tendon reflex exam. Skin: No petechial rash or dryness.  Appeared moist.  Psychiatric: Mood and affect appeared appropriate. Portacath/PICC-without erythema  Lab Results: Recent Labs    05/23/22 2250 05/24/22 0659  WBC 6.4 6.0  HGB 9.8* 9.5*  HCT 30.7* 29.1*  PLT 228 214    BMET Recent Labs     05/23/22 2250 05/24/22 0659  NA 134* 134*  K 4.1 3.9  CL 94* 92*  CO2 28 30  GLUCOSE 135* 121*  BUN 17 18  CREATININE 1.41* 1.32*  CALCIUM 8.7* 8.7*    Studies/Results: CT ABDOMEN PELVIS W CONTRAST  Result Date: 05/23/2022 CLINICAL DATA:  Abdominal pain, acute, nonlocalized EXAM: CT ABDOMEN AND PELVIS WITH CONTRAST TECHNIQUE: Multidetector CT imaging of the abdomen and pelvis was performed using the standard protocol following bolus administration of intravenous contrast. RADIATION DOSE REDUCTION: This exam was performed according to the departmental dose-optimization program which includes automated exposure control, adjustment of the mA and/or kV according to patient size and/or use of iterative reconstruction technique. CONTRAST:  138m OMNIPAQUE IOHEXOL 300 MG/ML  SOLN COMPARISON:  04/21/2022 FINDINGS: Lower chest: No acute abnormality Hepatobiliary: No focal hepatic abnormality. Gallbladder unremarkable. Pancreas: No focal abnormality or ductal dilatation. Spleen: No focal abnormality.  Normal size. Adrenals/Urinary Tract: Bilateral ureteral stents are in place, as seen on prior study. Moderate left hydronephrosis is stable. No hydronephrosis on the right. Scarring and cortical thinning in the upper pole of the left kidney. No adrenal mass. Urinary bladder unremarkable. Stomach/Bowel: Again noted is the area of circumferential wall thickening in the distal ascending colon near the hepatic flexure as seen on prior imaging. This appears to have worsened since prior study. Now, there is significant distention of the cecum and ascending colon proximal to this lesion, and small bowel loops are dilated and fluid-filled. Findings are suspicious for obstructing colon cancer. Vascular/Lymphatic: Aortic atherosclerosis. No evidence of aneurysm or adenopathy. Small scattered retroperitoneal lymph nodes again noted, slightly improved since prior study.  Reproductive: Uterus and adnexa unremarkable.  No mass.  Other: Mild ascites, best seen adjacent to the liver and in the right paracolic gutter. Musculoskeletal: No acute bony abnormality. IMPRESSION: Worsening circumferential irregular wall thickening in the distal ascending colon near the hepatic flexure which is now causing obstruction with dilated cecum and ascending colon proximal to this lesion as well as dilated fluid-filled small bowel loops. 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 Bilateral ureteral stents remain in place with moderate left hydronephrosis, unchanged since prior study. Improving right hydronephrosis. Mild ascites. Electronically Signed   By: Rolm Baptise M.D.   On: 05/23/2022 23:43    Medications: I have reviewed the patient's current medications.  Assessment/Plan:  66 year old with:  1.  Advanced bladder cancer with adenocarcinoma primary histology.  She has received multiple therapies in the past and currently on 5-FU leucovorin maintenance.  She developed recurrent disease presumably with intracolonic metastasis.  She was scheduled to start different salvage therapy option utilizing FOLFIRI which will be withheld for the time being given the impending surgery.  Her performance status remains excellent and aggressive measures remains warranted at this time.  After her surgery is completed and the final pathology is available, we will resume systemic chemotherapy upon her recovery.  2.  Colonic obstruction: She is currently under evaluation by general surgery for surgical resection.  I agree with this approach given the limited metastatic disease outside of her colon and overall good performance status.  I do not think a systemic chemotherapy will be as effective in reducing her mass.  Surgery is her best immediate option.  3.  Cytopenias: Her counts are adequate at this time.  She does not have any neutropenia or severe anemia.  She should have low risk from surgical complications given her previous chemotherapy  exposure.  4.  Abdominal pain: Related to her colonic obstruction.  Supportive management as per the primary team.  This was discussed with the patient and her husband in detail.  All her questions were answered to her satisfaction.  35  minutes were dedicated to this visit.  50% of the time was face-to-face.  The time was spent on reviewing laboratory data, imaging studies, discussing treatment options,  and answering questions regarding future plan.    LOS: 0 days   Zola Button 05/24/2022, 9:39 AM

## 2022-05-24 NOTE — Progress Notes (Signed)
Mary Stark ambulated to the bathroom with her husband and had a second episode of diarrhea. Her husband was helping to clean her up, while I changed the sheets. Our secretary brought her a glass of ice and her husband stated her hand was shaking and then she dropped the glass. Mary Stark stated he notice she was not acting herself. Neuro checks started and Rapid response called. Patient was slow to respond to instructions at times. She wasn't tracking my finger.Hand strength was moderately strong and equal  The ICU nurse continued with the neuro checks. Mary Stark was brought down to CT for a scan and Dr. Quinn Axe was contacted. Vital signs remained stable.

## 2022-05-24 NOTE — H&P (Addendum)
History and Physical    Mary Stark NOI:[id number] DOB: 12-Nov-1956 DOA: 05/23/2022  PCP: [name], NP  Patient coming from: home I have personally briefly reviewed patient's old medical records in [place]  Chief Complaint: abdominal pain x 3 weeks progressive, n/v x 1 day   HPI: Mary Stark is a 66 y.o. 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 Per patient she states over the last three days she has had decrease appetite and nausea but was able to keep down soup until today. She notes no associated fever/chills/ blood in stools, or hematemesis. She states her last bowel movement was small and loose which occurred prior to onset of n/v. She states over the last few weeks she has had significant constipation despite being on bowel regimen. She also noted increase abdominal distention as well. She currently states s/p treatment in ED she feels improved  ED Course she feels improved Vitals: afeb, bp 124/77, hr 65, rr 16sat 94% on ra Labs: UA:wbc 21-50 large LE, no bacteria  NA:134, K 4.1, cr 1.41 at baseline, albumin 3.4 Wbc:6.4, hgb 9.8, mcv 109, plt 228  Ct abd/pelvis: Worsening circumferential irregular wall thickening in the distal ascending colon near the hepatic flexure which is now causing obstruction with dilated cecum and ascending colon proximal to this lesion as well as dilated fluid-filled small bowel loops. 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   Bilateral ureteral stents remain in place with moderate left hydronephrosis, unchanged since prior study. Improving right hydronephrosis.   Mild ascites.  Tx: morphine '4mg'$  , zofran '4mg'$    Review of Systems: As per HPI otherwise 10 point review of systems negative.    Past Medical History:  Diagnosis Date   Adenocarcinoma of bladder, stage 4 ([place]) 10/2020   oncologist--- dr Alen Blew and urologist-- dr pace;  w/ mets , started chemo 12/ 2021 and bilateral ureteral stents to treat hydronephrosis   Alcohol dependence ([place])    drank heavily years ago doesn't now 10/22/2021   Anemia due to chemotherapy    Anxiety    Arthritis    Back and lt knee   CKD (chronic kidney disease), stage III [facility])    COVID    August 2021, was in hospital and was on Oxygen for 11 weeks lungs still look like glass. 10/22/2021   Family history of adverse reaction to anesthesia    father had a "tight" airway   GERD (gastroesophageal reflux disease)    History of 2019 novel coronavirus disease (COVID-19) 08/13/2020   positive result in epic 08-19-2020,  hospital admission in epic,  covid pneumonia with hypoxia respiratory failure, discharged with oxygen for 6 weeks   History of chemotherapy last done 05-13-2021   History of colitis 03/2018   acute colitis with sepsis   History of hepatitis C    TX FOR 2012   History of kidney stones    History of rheumatic fever as a child    per pt no valvular issues   History of sepsis 10/2020   hospital admission in epic due to pyelonephritis due to hydronephrosis, resolved   History of skin cancer    excision from nose   Hyperlipidemia    Hypertension    MDD (major depressive disorder)    Pneumonia  august 2022   Seasonal allergies    Sepsis [facility])    uro sepsis June 2022   Vitamin D deficiency     Past Surgical History:  Procedure Laterality Date   ANAL RECTAL MANOMETRY N/A 01/31/2020   Procedure: ANO RECTAL MANOMETRY;  Surgeon: Arta Silence, MD;  Location: [facility];  Service: Endoscopy;  Laterality: N/A;   CESAREAN SECTION  1984   CYSTOSCOPY W/ URETERAL STENT PLACEMENT Bilateral 01/21/2021   Procedure: CYSTOSCOPY WITH STENT REPLACEMENT;  Surgeon: Robley Fries, MD;  Location: [facility];  Service:  Urology;  Laterality: Bilateral;  1 HR   CYSTOSCOPY W/ URETERAL STENT PLACEMENT Bilateral 05/20/2021   Procedure: CYSTOSCOPY WITH RETROGRADE PYELOGRAM/URETERAL STENT EXCHANGE;  Surgeon: Robley Fries, MD;  Location[facility];  Service: Urology;  Laterality: Bilateral;   CYSTOSCOPY W/ URETERAL STENT PLACEMENT Bilateral 10/28/2021   Procedure: CYSTOSCOPY WITH RETROGRADE PYELOGRAM/URETERAL STENT EXCHANGE;  Surgeon: Robley Fries, MD;  Location: [facility];  Service: Urology;  Laterality: Bilateral;   CYSTOSCOPY W/ URETERAL STENT PLACEMENT Bilateral 04/28/2022   Procedure: CYSTOSCOPY Wyvonnia Dusky STENT EXCHANGE;  Surgeon: Robley Fries, MD;  Location: [facility];  Service: Urology;  Laterality: Bilateral;  1 HR   CYSTOSCOPY WITH RETROGRADE PYELOGRAM, URETEROSCOPY AND STENT PLACEMENT Bilateral 06/11/2020   Procedure: CYSTOSCOPY WITH RETROGRADE PYELOGRAM, URETEROSCOPY AND STENT PLACEMENT, RIGHT URETERAL BIOPSY;  Surgeon: Robley Fries, MD;  Location: [facility];  Service: Urology;  Laterality: Bilateral;  30 MINS   CYSTOSCOPY WITH RETROGRADE PYELOGRAM, URETEROSCOPY AND STENT PLACEMENT Bilateral 07/05/2020   Procedure: CYSTOSCOPY WITH RETROGRADE PYELOGRAM, URETEROSCOPY,  BIOPSIES AND STENT EXCHANGES;  Surgeon: Robley Fries, MD;  Location: [facility];  Service: Urology;  Laterality: Bilateral;   FINGER SURGERY Right    5th   GYNECOLOGIC CRYOSURGERY  YRS AGO   IR IMAGING GUIDED PORT INSERTION  11/18/2020   TONSILLECTOMY  AS CHILD   TRANSURETHRAL RESECTION OF BLADDER TUMOR  01/21/2021   Procedure: TRANSURETHRAL RESECTION OF BLADDER TUMOR (TURBT);  Surgeon: Robley Fries, MD;  Location: [facility];  Service: Urology;;     reports that she quit smoking about 11 years ago. Her smoking use included cigarettes. She has a 25.00 pack-year smoking history. She has never used smokeless tobacco. She reports current alcohol  use. She reports that she does not use drugs.  Allergies  Allergen Reactions   Metformin And Related Other (See Comments)    Lactic acidosis    Penicillins Hives and Rash    Reports hives to penicillin and amoxicillin as an adult "a long time ago." Has tolerated cefdinir (09/2020) and cefepime (10/2020) before.   Metformin Diarrhea    Extreme diarrhea and lactic acidosis   Penicillin G Sodium Hives and Rash    Family History  Problem Relation Age of Onset   Ovarian cancer Mother    Cardiomyopathy Father    Valvular heart disease Father    Liver cancer Brother    Protein C deficiency Brother    Protein C deficiency Brother    Stroke Brother     Prior to Admission medications   Medication Sig Start Date End Date Taking? Authorizing Provider  albuterol (PROAIR HFA) 108 (90 Base) MCG/ACT inhaler INHALE 1 TO 2 PUFFS BY MOUTH INTO THE LUNGS EVERY 4 HOURS AS NEEDED FOR WHEEZING OR SHORTNESS OF BREATH 10/21/21  Yes Hali Marry, MD  ARIPiprazole (ABILIFY) 10 MG tablet TAKE 1 TABLET BY  MOUTH ONCE DAILY Patient taking differently: Take 10 mg by mouth at bedtime. 03/19/22 03/19/23 Yes [name], NP  azelastine (ASTELIN) 0.1 % nasal spray Place 2 sprays into both nostrils 2 (two) times daily. Use in each nostril as directed Patient taking differently: Place 2 sprays into both nostrils as needed for allergies. Use in each nostril as directed 01/07/22  Yes [name], NP  bisacodyl (DULCOLAX) 5 MG EC tablet Take 2 tablets (10 mg total) by mouth 2 (two) times daily. 04/27/22  Yes   buPROPion (WELLBUTRIN XL) 300 MG 24 hr tablet TAKE 1 TABLET BY MOUTH EVERY DAY 05/07/22 05/07/23 Yes Jessup, Joy, NP  cephALEXin (KEFLEX) 250 MG capsule Take 1 capsule by mouth nightly as directed Patient taking differently: Take 250 mg by mouth at bedtime. Continuous therapy due to 2 urinary stents 12/18/21  Yes   clonazePAM (KLONOPIN) 1 MG tablet Take 1 tablet (1 mg total) by mouth at bedtime. 04/17/22  Yes [name], NP  cycloSPORINE (RESTASIS) 0.05 % ophthalmic emulsion Place 1 drop both eyes twice a day Patient taking differently: Place 1 drop into both eyes daily. 11/04/21  Yes   gabapentin (NEURONTIN) 300 MG capsule Take 1 capsule by mouth daily and 4 capsules at bedtime as directed. Patient taking differently: Take 300-1,200 mg by mouth See admin instructions. Take 1 capsule by mouth in the morning then take 3 capsules at night 05/07/22  Yes Jessup, Caryl Asp, NP  HYDROcodone-acetaminophen (NORCO) 5-325 MG tablet Take 1 tablet by mouth every 6 (six) hours as needed for severe pain. 05/22/22  Yes Walisiewicz, Kaitlyn E, PA-C  lidocaine-prilocaine (EMLA) cream Apply 1 application topically as needed. Patient taking differently: Apply 1 application  topically as needed (for port access). 10/16/21  Yes Wyatt Portela, MD  mirabegron ER (MYRBETRIQ) 25 MG TB24 tablet TAKE 1 TABLET BY MOUTH ONCE A DAY Patient taking differently: Take 25 mg by mouth daily. 01/19/22 01/19/23 Yes   omeprazole (PRILOSEC) 20 MG capsule Take 1 capsule (20 mg total) by mouth daily. 06/23/21 06/28/22 Yes Alexander, [name], DO  ondansetron (ZOFRAN) 4 MG tablet Take 1 tablet (4 mg total) by mouth every 8 (eight) hours as needed for nausea or vomiting. 04/30/21  Yes Shadad, Mathis Dad, MD  oxymetazoline (AFRIN) 0.05 % nasal spray Place 1 spray into both nostrils 2 (two) times daily as needed for congestion. For nose bleeds   Yes [provider]  sertraline (ZOLOFT) 25 MG tablet Take 1 tablet (25 mg total) by mouth daily. 04/17/22  Yes Jessup, Joy, NP  simvastatin (ZOCOR) 20 MG tablet TAKE 1 TABLET (20 MG TOTAL) BY MOUTH DAILY. Patient taking differently: Take 20 mg by mouth at bedtime. 07/08/21 08/10/22 Yes [name], DO  tamsulosin (FLOMAX) 0.4 MG CAPS capsule TAKE 1 CAPSULE BY MOUTH AT BEDTIME Patient taking differently: Take 0.4 mg by mouth at bedtime. 01/19/22 01/19/23 Yes   bisacodyl (DULCOLAX) 5 MG EC tablet Take 2 tablets by mouth twice  a day. Patient taking differently: Take 10 mg by mouth 2 (two) times daily. 04/27/22     Meth-Hyo-M Bl-Na Phos-Ph Sal (URIBEL) 118 MG CAPS Take one capsule by mouth 3 times daily Patient not taking: Reported on 05/23/2022 06/06/21     Nebivolol HCl 20 MG TABS Take 1/2 tablet (10 mg total) by mouth daily. Patient not taking: Reported on 05/23/2022 09/15/21 09/15/22  [name], NP  polyethylene glycol-electrolytes (NULYTELY) 420 g solution Use as directed 04/27/22     polyethylene glycol-electrolytes (  NULYTELY) 420 g solution Take as directed over 2 days. 04/27/22       Physical Exam: Vitals:   05/24/22 0000 05/24/22 0021 05/24/22 0100 05/24/22 0200  BP:  124/77 121/75 113/74  Pulse: 66 65 73 65  Resp: 12 13 (!) 23 12  Temp:  98.1 F (36.7 C)    TempSrc:      SpO2: (!) 88% (!) 87% 94% 94%  Weight:      Height:         Vitals:   05/24/22 0000 05/24/22 0021 05/24/22 0100 05/24/22 0200  BP:  124/77 121/75 113/74  Pulse: 66 65 73 65  Resp: 12 13 (!) 23 12  Temp:  98.1 F (36.7 C)    TempSrc:      SpO2: (!) 88% (!) 87% 94% 94%  Weight:      Height:      Constitutional: NAD, calm, comfortable Eyes: PERRL, lids and conjunctivae normal ENMT: Mucous membranes are dry Posterior pharynx clear of any exudate or lesions.Normal dentition.  Neck: normal, supple, no masses, no thyromegaly Respiratory: clear to auscultation bilaterally, no wheezing, no crackles. Normal respiratory effort. No accessory muscle use.  Cardiovascular: Regular rate and rhythm, no murmurs / rubs / gallops. No extremity edema. 2+ pedal pulses.  Abdomen: mild diffuse tenderness, no masses palpated. No hepatosplenomegaly. Bowel sounds hypoactive, abdomen distended  Musculoskeletal: no clubbing / cyanosis. No joint deformity upper and lower extremities. Good ROM, no contractures. Normal muscle tone.  Skin: no rashes, lesions, ulcers. No induration Neurologic: CN 2-12 grossly intact. Sensation intact,  Strength 5/5 in all 4.   Psychiatric: Normal judgment and insight. Alert and oriented x 3. Normal mood.    Labs on Admission: I have personally reviewed following labs and imaging studies  CBC: Recent Labs  Lab 05/22/22 1255 05/23/22 2250  WBC 5.7 6.4  NEUTROABS 3.5 4.6  HGB 8.6* 9.8*  HCT 26.7* 30.7*  MCV 111.3* 109.3*  PLT 158 938   Basic Metabolic Panel: Recent Labs  Lab 05/22/22 1255 05/23/22 2250  NA 133* 134*  K 5.4* 4.1  CL 102 94*  CO2 26 28  GLUCOSE 90 135*  BUN 15 17  CREATININE 1.31* 1.41*  CALCIUM 8.7* 8.7*   GFR: Estimated Creatinine Clearance: 42.3 mL/min (A) (by C-G formula based on SCr of 1.41 mg/dL (H)). Liver Function Tests: Recent Labs  Lab 05/22/22 1255 05/23/22 2250  AST 10* 17  ALT 5 9  ALKPHOS 77 80  BILITOT 0.4 0.6  PROT 6.3* 6.9  ALBUMIN 3.5 3.4*   Recent Labs  Lab 05/23/22 2250  LIPASE 19   No results for input(s): "AMMONIA" in the last 168 hours. Coagulation Profile: No results for input(s): "INR", "PROTIME" in the last 168 hours. Cardiac Enzymes: No results for input(s): "CKTOTAL", "CKMB", "CKMBINDEX", "TROPONINI" in the last 168 hours. BNP (last 3 results) No results for input(s): "PROBNP" in the last 8760 hours. HbA1C: No results for input(s): "HGBA1C" in the last 72 hours. CBG: No results for input(s): "GLUCAP" in the last 168 hours. Lipid Profile: No results for input(s): "CHOL", "HDL", "LDLCALC", "TRIG", "CHOLHDL", "LDLDIRECT" in the last 72 hours. Thyroid Function Tests: No results for input(s): "TSH", "T4TOTAL", "FREET4", "T3FREE", "THYROIDAB" in the last 72 hours. Anemia Panel: No results for input(s): "VITAMINB12", "FOLATE", "FERRITIN", "TIBC", "IRON", "RETICCTPCT" in the last 72 hours. Urine analysis:    Component Value Date/Time   COLORURINE AMBER (A) 05/23/2021 0237   APPEARANCEUR HAZY (A) 05/23/2021 1829  LABSPEC 1.009 05/23/2021 0237   PHURINE 6.0 05/23/2021 0237   GLUCOSEU NEGATIVE 05/23/2021 0237   HGBUR SMALL (A)  05/23/2021 0237   BILIRUBINUR small (A) 11/01/2021 0850   KETONESUR trace (5) (A) 11/01/2021 0850   KETONESUR NEGATIVE 05/23/2021 0237   PROTEINUR >=300 (A) 11/01/2021 0850   PROTEINUR 100 (A) 05/23/2021 0237   UROBILINOGEN 2.0 (A) 11/01/2021 0850   NITRITE Positive (A) 11/01/2021 0850   NITRITE POSITIVE (A) 05/23/2021 0237   LEUKOCYTESUR Large (3+) (A) 11/01/2021 0850   LEUKOCYTESUR LARGE (A) 05/23/2021 0237    Radiological Exams on Admission: CT ABDOMEN PELVIS W CONTRAST  Result Date: 05/23/2022 CLINICAL DATA:  Abdominal pain, acute, nonlocalized EXAM: CT ABDOMEN AND PELVIS WITH CONTRAST TECHNIQUE: Multidetector CT imaging of the abdomen and pelvis was performed using the standard protocol following bolus administration of intravenous contrast. RADIATION DOSE REDUCTION: This exam was performed according to the departmental dose-optimization program which includes automated exposure control, adjustment of the mA and/or kV according to patient size and/or use of iterative reconstruction technique. CONTRAST:  162m OMNIPAQUE IOHEXOL 300 MG/ML  SOLN COMPARISON:  04/21/2022 FINDINGS: Lower chest: No acute abnormality Hepatobiliary: No focal hepatic abnormality. Gallbladder unremarkable. Pancreas: No focal abnormality or ductal dilatation. Spleen: No focal abnormality.  Normal size. Adrenals/Urinary Tract: Bilateral ureteral stents are in place, as seen on prior study. Moderate left hydronephrosis is stable. No hydronephrosis on the right. Scarring and cortical thinning in the upper pole of the left kidney. No adrenal mass. Urinary bladder unremarkable. Stomach/Bowel: Again noted is the area of circumferential wall thickening in the distal ascending colon near the hepatic flexure as seen on prior imaging. This appears to have worsened since prior study. Now, there is significant distention of the cecum and ascending colon proximal to this lesion, and small bowel loops are dilated and fluid-filled.  Findings are suspicious for obstructing colon cancer. Vascular/Lymphatic: Aortic atherosclerosis. No evidence of aneurysm or adenopathy. Small scattered retroperitoneal lymph nodes again noted, slightly improved since prior study. Reproductive: Uterus and adnexa unremarkable.  No mass. Other: Mild ascites, best seen adjacent to the liver and in the right paracolic gutter. Musculoskeletal: No acute bony abnormality. IMPRESSION: Worsening circumferential irregular wall thickening in the distal ascending colon near the hepatic flexure which is now causing obstruction with dilated cecum and ascending colon proximal to this lesion as well as dilated fluid-filled small bowel loops. 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 Bilateral ureteral stents remain in place with moderate left hydronephrosis, unchanged since prior study. Improving right hydronephrosis. Mild ascites. Electronically Signed   By: KRolm BaptiseM.D.   On: 05/23/2022 23:43    EKG: Independently reviewed. 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  -npo  -supportive care with ivfs  -surgery consult ( Dr GHarlow Asato see in am )  -no current decompression as no further n/v and pain relieved s/p treatment in ED , will continue monitor need for decompression  -strict I/o  -ivfs  -replete electrolytes   Mild hyponatremia -due to low volume status -continue to monitor  Hx of ETOH dependence -per patient 4 beers per day , denies history of withdrawal symptoms  -place on ciwa   Stage IV adenocarcinoma of bladder -on chemo FOLFOX q14d  51/61/0960-complicated by urinary obstruction s/p urinary stents due b/l hydronephrosis -patient continues on prophalactic antibiotics due  stents for prevention of UTI Will place no ctx for now, urine culture pending -now with colonic obstruction  -consult oncology in am  followed by Dr. Alen Blew  Anemia related to chemo therapy -h/h stable    Anxiety  -stable prn  ativan  -resume home regimen as able    CKDIII -at baseline   GERD -ppi  DVT prophylaxis: heparin  Code Status: full Family Communication: none at bedside  Disposition Plan: patient  expected to be admitted greater than 2 midnights  Consults called: general surgery [place] Admission status:tele    Clance Boll MD [facility]  If 7PM-7AM, please contact night-coverage [email or web address] Password [email or web address]  05/24/2022, 2:12 AM

## 2022-05-24 NOTE — Consult Note (Signed)
NEUROLOGY TELECONSULTATION NOTE   Date of service: May 24, 2022 Patient Name: Mary Stark MRN:  [id number] DOB:  20-Feb-1956 Reason for consult: dropped cup from L hand, c/f possible increased L sided weakness - telestroke  Requesting Provider: Dr. Candida Peeling  Consult Participants: myself, patient, rapid response RN, telestroke RN Location of the provider: [facility], [place] Location of the patient: [name]  This consult was provided via telemedicine with 2-way video and audio communication. The patient/family was informed that care would be provided in this way and agreed to receive care in this manner.   _ _ _   _ __   _ __ _ _  __ __   _ __   __ _  History of Present Illness   This is a 66 yo woman with hx stage 4 bladder cancer, EtOH dependence, CKD stage III, hepatitis C, HTN, HL, prior stroke with residual L sided weakness admitted to [facility] for bowel obstruction 2/2 metastatic bladder cancer. LKW 1630 when she was walking to the bathroom. Subsequently she was attempting to drink from a cup she was holding in her L hand and dropped the cup and there is concern for slight worsening of her baseline L sided weakness. On examination she is encephalopathic, unable to answer orientation questions, and requires repeated prompting to follow commands. She is diffusely weak with drift in all extremities though slightly more prominent in the LLE. She has prominent asterixis. NIHSS = 10. Head CT NAICP (personal review). TNK not administered 2/2 low suspicion for ischemic etiology. Exam was not c/w LVO therefore vascular imaging was not performed as part of the telestroke.   ROS   UTA 2/2 encephalopathy  Past History   The following was personally reviewed:  Past Medical History:  Diagnosis Date   Adenocarcinoma of bladder, stage 4 ([place]) 10/2020   oncologist--- dr Alen Blew and urologist-- dr pace;  w/ mets , started chemo 12/ 2021 and bilateral ureteral stents to treat hydronephrosis    Alcohol dependence ([place])    drank heavily years ago doesn't now 10/22/2021   Anemia due to chemotherapy    Anxiety    Arthritis    Back and lt knee   CKD (chronic kidney disease), stage III [facility])    COVID    August 2021, was in hospital and was on Oxygen for 11 weeks lungs still look like glass. 10/22/2021   Family history of adverse reaction to anesthesia    father had a "tight" airway   GERD (gastroesophageal reflux disease)    History of 2019 novel coronavirus disease (COVID-19) 08/13/2020   positive result in epic 08-19-2020,  hospital admission in epic,  covid pneumonia with hypoxia respiratory failure, discharged with oxygen for 6 weeks   History of chemotherapy last done 05-13-2021   History of colitis 03/2018   acute colitis with sepsis   History of hepatitis C    TX FOR 2012   History of kidney stones    History of rheumatic fever as a child    per pt no valvular issues   History of sepsis 10/2020   hospital admission in epic due to pyelonephritis due to hydronephrosis, resolved   History of skin cancer    excision from nose   Hyperlipidemia    Hypertension    MDD (major depressive disorder)    Pneumonia    august 2022   Seasonal allergies    Sepsis ([place])    uro sepsis June 2022   Vitamin  D deficiency    Past Surgical History:  Procedure Laterality Date   ANAL RECTAL MANOMETRY N/A 01/31/2020   Procedure: ANO RECTAL MANOMETRY;  Surgeon: Arta Silence, MD;  Location: [facility];  Service: Endoscopy;  Laterality: N/A;   CESAREAN SECTION  1984   CYSTOSCOPY W/ URETERAL STENT PLACEMENT Bilateral 01/21/2021   Procedure: CYSTOSCOPY WITH STENT REPLACEMENT;  Surgeon: Robley Fries, MD;  Location: [facility];  Service: Urology;  Laterality: Bilateral;  1 HR   CYSTOSCOPY W/ URETERAL STENT PLACEMENT Bilateral 05/20/2021   Procedure: CYSTOSCOPY WITH RETROGRADE PYELOGRAM/URETERAL STENT EXCHANGE;  Surgeon: Robley Fries, MD;  Location[facility];  Service: Urology;  Laterality: Bilateral;   CYSTOSCOPY W/ URETERAL STENT PLACEMENT Bilateral 10/28/2021   Procedure: CYSTOSCOPY WITH RETROGRADE PYELOGRAM/URETERAL STENT EXCHANGE;  Surgeon: Robley Fries, MD;  Location: [facility];  Service: Urology;  Laterality: Bilateral;   CYSTOSCOPY W/ URETERAL STENT PLACEMENT Bilateral 04/28/2022   Procedure: CYSTOSCOPY Wyvonnia Dusky STENT EXCHANGE;  Surgeon: Robley Fries, MD;  Location: [facility];  Service: Urology;  Laterality: Bilateral;  1 HR   CYSTOSCOPY WITH RETROGRADE PYELOGRAM, URETEROSCOPY AND STENT PLACEMENT Bilateral 06/11/2020   Procedure: CYSTOSCOPY WITH RETROGRADE PYELOGRAM, URETEROSCOPY AND STENT PLACEMENT, RIGHT URETERAL BIOPSY;  Surgeon: Robley Fries, MD;  Location: [facility];  Service: Urology;  Laterality: Bilateral;  21 MINS   CYSTOSCOPY WITH RETROGRADE PYELOGRAM, URETEROSCOPY AND STENT PLACEMENT Bilateral 07/05/2020   Procedure: CYSTOSCOPY WITH RETROGRADE PYELOGRAM, URETEROSCOPY,  BIOPSIES AND STENT EXCHANGES;  Surgeon: Robley Fries, MD;  Location: [facility];  Service: Urology;  Laterality: Bilateral;   FINGER SURGERY Right    5th   GYNECOLOGIC CRYOSURGERY  YRS AGO   IR IMAGING GUIDED PORT INSERTION  11/18/2020   TONSILLECTOMY  AS CHILD   TRANSURETHRAL RESECTION OF BLADDER TUMOR  01/21/2021   Procedure: TRANSURETHRAL RESECTION OF BLADDER TUMOR (TURBT);  Surgeon: Robley Fries, MD;  Location: [facility];  Service: Urology;;   Family History  Problem Relation Age of Onset   Ovarian cancer Mother    Cardiomyopathy Father    Valvular heart disease Father    Liver cancer Brother    Protein C deficiency Brother    Protein C deficiency Brother    Stroke Brother    Social History   Socioeconomic History   Marital status: Married    Spouse name: Not on file   Number of children: Not on file   Years of education: Not on file   Highest education level:  Not on file  Occupational History   Not on file  Tobacco Use   Smoking status: Former    Packs/day: 1.00    Years: 25.00    Total pack years: 25.00    Types: Cigarettes    Quit date: 2012    Years since quitting: 11.4   Smokeless tobacco: Never  Vaping Use   Vaping Use: Every day   Substances: Nicotine  Substance and Sexual Activity   Alcohol use: Yes    Comment: occas beer   Drug use: No   Sexual activity: Not on file  Other Topics Concern   Not on file  Social History Narrative   Not on file   Social Determinants of Health   Financial Resource Strain: Medium Risk (06/06/2021)   Overall Financial Resource Strain (CARDIA)    Difficulty of Paying Living Expenses: Somewhat hard  Food Insecurity: No Food Insecurity (06/06/2021)   Hunger  Vital Sign    Worried About Charity fundraiser in the Last Year: Never true    Ran Out of Food in the Last Year: Never true  Transportation Needs: No Transportation Needs (06/06/2021)   PRAPARE - Hydrologist (Medical): No    Lack of Transportation (Non-Medical): No  Physical Activity: Not on file  Stress: No Stress Concern Present (06/06/2021)   [place]    Feeling of Stress : Not at all  Social Connections: Moderately Integrated (06/06/2021)   Social Connection and Isolation Panel [NHANES]    Frequency of Communication with Friends and Family: More than three times a week    Frequency of Social Gatherings with Friends and Family: Never    Attends Religious Services: Never    Marine scientist or Organizations: No    Attends Music therapist: 1 to 4 times per year    Marital Status: Married   Allergies  Allergen Reactions   Metformin And Related Other (See Comments)    Lactic acidosis    Penicillins Hives and Rash    Reports hives to penicillin and amoxicillin as an adult "a long time ago." Has tolerated cefdinir (09/2020)  and cefepime (10/2020) before.   Metformin Diarrhea    Extreme diarrhea and lactic acidosis   Penicillin G Sodium Hives and Rash    Medications   Medications Prior to Admission  Medication Sig Dispense Refill Last Dose   albuterol (PROAIR HFA) 108 (90 Base) MCG/ACT inhaler INHALE 1 TO 2 PUFFS BY MOUTH INTO THE LUNGS EVERY 4 HOURS AS NEEDED FOR WHEEZING OR SHORTNESS OF BREATH 8 g 1 Past Month   ARIPiprazole (ABILIFY) 10 MG tablet TAKE 1 TABLET BY MOUTH ONCE DAILY (Patient taking differently: Take 10 mg by mouth at bedtime.) 90 tablet 0 05/22/2022   azelastine (ASTELIN) 0.1 % nasal spray Place 2 sprays into both nostrils 2 (two) times daily. Use in each nostril as directed (Patient taking differently: Place 2 sprays into both nostrils as needed for allergies. Use in each nostril as directed) 301 mL 1 Past Month   bisacodyl (DULCOLAX) 5 MG EC tablet Take 2 tablets (10 mg total) by mouth 2 (two) times daily. 4 tablet 0 05/22/2022   buPROPion (WELLBUTRIN XL) 300 MG 24 hr tablet TAKE 1 TABLET BY MOUTH EVERY DAY 90 tablet 1 05/22/2022   cephALEXin (KEFLEX) 250 MG capsule Take 1 capsule by mouth nightly as directed (Patient taking differently: Take 250 mg by mouth at bedtime. Continuous therapy due to 2 urinary stents) 90 capsule 1 05/22/2022   clonazePAM (KLONOPIN) 1 MG tablet Take 1 tablet (1 mg total) by mouth at bedtime. 30 tablet 2 05/22/2022   cycloSPORINE (RESTASIS) 0.05 % ophthalmic emulsion Place 1 drop both eyes twice a day (Patient taking differently: Place 1 drop into both eyes daily.) 180 each 3 05/23/2022   gabapentin (NEURONTIN) 300 MG capsule Take 1 capsule by mouth daily and 4 capsules at bedtime as directed. (Patient taking differently: Take 300-1,200 mg by mouth See admin instructions. Take 1 capsule by mouth in the morning then take 3 capsules at night) 450 capsule 1 05/22/2022   HYDROcodone-acetaminophen (NORCO) 5-325 MG tablet Take 1 tablet by mouth every 6 (six) hours as needed for severe pain.  30 tablet 0 05/23/2022 at 2100   lidocaine-prilocaine (EMLA) cream Apply 1 application topically as needed. (Patient taking differently: Apply 1 application  topically as  needed (for port access).) 30 g 0 UNKNOWN   mirabegron ER (MYRBETRIQ) 25 MG TB24 tablet TAKE 1 TABLET BY MOUTH ONCE A DAY (Patient taking differently: Take 25 mg by mouth daily.) 30 tablet 3 05/23/2022   omeprazole (PRILOSEC) 20 MG capsule Take 1 capsule (20 mg total) by mouth daily. 90 capsule 3 05/22/2022   ondansetron (ZOFRAN) 4 MG tablet Take 1 tablet (4 mg total) by mouth every 8 (eight) hours as needed for nausea or vomiting. 40 tablet 3 05/23/2022 at 2000   oxymetazoline (AFRIN) 0.05 % nasal spray Place 1 spray into both nostrils 2 (two) times daily as needed for congestion. For nose bleeds   Past Week   sertraline (ZOLOFT) 25 MG tablet Take 1 tablet (25 mg total) by mouth daily. 90 tablet 1 05/23/2022   simvastatin (ZOCOR) 20 MG tablet TAKE 1 TABLET (20 MG TOTAL) BY MOUTH DAILY. (Patient taking differently: Take 20 mg by mouth at bedtime.) 90 tablet 3 05/22/2022   tamsulosin (FLOMAX) 0.4 MG CAPS capsule TAKE 1 CAPSULE BY MOUTH AT BEDTIME (Patient taking differently: Take 0.4 mg by mouth at bedtime.) 30 capsule 3 05/22/2022   bisacodyl (DULCOLAX) 5 MG EC tablet Take 2 tablets by mouth twice a day. (Patient taking differently: Take 10 mg by mouth 2 (two) times daily.) 4 tablet 0    Meth-Hyo-M Bl-Na Phos-Ph Sal (URIBEL) 118 MG CAPS Take one capsule by mouth 3 times daily (Patient not taking: Reported on 05/23/2022) 30 capsule 3 Not Taking   Nebivolol HCl 20 MG TABS Take 1/2 tablet (10 mg total) by mouth daily. (Patient not taking: Reported on 05/23/2022) 90 tablet 1 Not Taking   polyethylene glycol-electrolytes (NULYTELY) 420 g solution Use as directed 4000 mL 0    polyethylene glycol-electrolytes (NULYTELY) 420 g solution Take as directed over 2 days. 4000 mL 0       Current Facility-Administered Medications:    0.9 %  sodium chloride  infusion, , Intravenous, Continuous, Clance Boll, MD, Last Rate: 125 mL/hr at 05/24/22 0425, New Bag at 05/24/22 0425   0.9 %  sodium chloride infusion, , Intravenous, Continuous, Pahwani, Rinka R, MD   acetaminophen (OFIRMEV) IV 1,000 mg, 1,000 mg, Intravenous, Q8H PRN, Clance Boll, MD   [START ON 05/25/2022] acetaminophen (TYLENOL) tablet 1,000 mg, 1,000 mg, Oral, On Call to OR, Michael Boston, MD   albuterol (PROVENTIL) (2.5 MG/3ML) 0.083% nebulizer solution 2.5 mg, 2.5 mg, Nebulization, Q6H PRN, Clance Boll, MD   [START ON 05/25/2022] alvimopan (ENTEREG) capsule 12 mg, 12 mg, Oral, On Call to OR, Michael Boston, MD   bisacodyl (DULCOLAX) suppository 10 mg, 10 mg, Rectal, Daily, Michael Boston, MD   [START ON 05/25/2022] bupivacaine liposome (EXPAREL) 1.3 % injection 266 mg, 20 mL, Infiltration, PRN, Michael Boston, MD   [START ON 05/25/2022] cefoTEtan (CEFOTAN) 2 g in sodium chloride 0.9 % 100 mL IVPB, 2 g, Intravenous, On Call to OR, Michael Boston, MD   cefTRIAXone (ROCEPHIN) 1 g in sodium chloride 0.9 % 100 mL IVPB, 1 g, Intravenous, Q24H, Myles Rosenthal A, MD, Last Rate: 200 mL/hr at 05/24/22 0508, 1 g at 05/24/22 0508   Chlorhexidine Gluconate Cloth 2 % PADS 6 each, 6 each, Topical, Daily, Pahwani, Rinka R, MD, 6 each at 05/24/22 1300   cycloSPORINE (RESTASIS) 0.05 % ophthalmic emulsion 1 drop, 1 drop, Both Eyes, Daily, Myles Rosenthal A, MD, 1 drop at 05/24/22 1726   [START ON 05/25/2022] enoxaparin (LOVENOX) injection 40 mg, 40 mg,  Subcutaneous, Once, Michael Boston, MD   [START ON 05/25/2022] feeding supplement (ENSURE PRE-SURGERY) liquid 296 mL, 296 mL, Oral, Once, Michael Boston, MD   [START ON 05/25/2022] gabapentin (NEURONTIN) capsule 300 mg, 300 mg, Oral, On Call to OR, Michael Boston, MD   heparin injection 5,000 Units, 5,000 Units, Subcutaneous, Q8H, Pahwani, Rinka R, MD   [START ON 05/26/2022] heparin injection 5,000 Units, 5,000 Units, Subcutaneous, Q8H, Pahwani,  Rinka R, MD   HYDROcodone-acetaminophen (NORCO/VICODIN) 5-325 MG per tablet 1 tablet, 1 tablet, Oral, Q4H PRN, Pahwani, Rinka R, MD   HYDROmorphone (DILAUDID) injection 1 mg, 1 mg, Intravenous, Q4H PRN, Pahwani, Rinka R, MD   LORazepam (ATIVAN) injection 0.5 mg, 0.5 mg, Intravenous, Q8H PRN, Clance Boll, MD   neomycin (MYCIFRADIN) tablet 1,000 mg, 1,000 mg, Oral, 3 times per day on Sun, 1,000 mg at 05/24/22 1722 **AND** metroNIDAZOLE (FLAGYL) tablet 1,000 mg, 1,000 mg, Oral, 3 times per day on Sun, Pahwani, Rinka R, MD, 1,000 mg at 05/24/22 1722   ondansetron (ZOFRAN) injection 4 mg, 4 mg, Intravenous, Once, Marlon Pel, Robert, PA-C   ondansetron (ZOFRAN) tablet 4 mg, 4 mg, Oral, Q6H PRN **OR** ondansetron (ZOFRAN) injection 4 mg, 4 mg, Intravenous, Q6H PRN, Myles Rosenthal A, MD, 4 mg at 05/24/22 0420   pantoprazole (PROTONIX) injection 40 mg, 40 mg, Intravenous, Q24H, Myles Rosenthal A, MD, 40 mg at 05/24/22 0505   polyethylene glycol (MIRALAX / GLYCOLAX) packet 17 g, 17 g, Oral, Once, Gross, Remo Lipps, MD   prochlorperazine (COMPAZINE) injection 10 mg, 10 mg, Intravenous, Q6H PRN, Pahwani, Rinka R, MD, 10 mg at 05/24/22 0804   promethazine (PHENERGAN) tablet 25 mg, 25 mg, Oral, Q6H PRN, Pahwani, Rinka R, MD, 25 mg at 05/24/22 1343  Vitals   Vitals:   05/24/22 0815 05/24/22 1058 05/24/22 1547 05/24/22 1809  BP: 115/62 129/81 (!) 142/78 135/89  Pulse: 72 78 77 66  Resp: '16 16 16 16  '$ Temp: 98.4 F (36.9 C) 98.3 F (36.8 C) 98.6 F (37 C) 98.3 F (36.8 C)  TempSrc: Oral Oral Oral Oral  SpO2: (!) 89% 90%  (!) 87%  Weight:      Height:         Body mass index is 27.44 kg/m.  Physical Exam   Exam performed over telemedicine with 2-way video and audio communication and with assistance of bedside RN  Physical Exam Gen: oriented to self but not age or month Resp: normal WOB CV: extremities appear well-perfused  Neuro: *MS: oriented to self but not age or month, requires  repeated prompting to follow simple commands *Speech: nondysarthric, was able to name 5 out of 6 objects *CN: PERRL 17m, EOMI, blinks to threat bilat, sensation intact, L UMN facial droop, hearing intact to voice *Motor:   Normal bulk.  Prominent asterixis. BUE and RLE drift but not to bed, LLE drift to bed. *Sensory: Impaired to LT on LLE. Symmetric. No double-simultaneous extinction.  *Coordination:  FNF with action tremor bilat but no frank ataxia *Reflexes:  UTA 2/2 tele-exam *Gait: deferred  NIHSS  1a Level of Conscious.: 0 1b LOC Questions: 2 1c LOC Commands: 0 2 Best Gaze: 0 3 Visual: 0 4 Facial Palsy: 1 5a Motor Arm - left: 1 5b Motor Arm - Right: 1 6a Motor Leg - Left: 2 6b Motor Leg - Right: 1 7 Limb Ataxia: 0 8 Sensory: 1 9 Best Language: 1 10 Dysarthria: 0 11 Extinct. and Inatten.: 0  TOTAL: 10   Premorbid mRS =  2   Labs   CBC:  Recent Labs  Lab 05/23/22 2250 05/24/22 0659  WBC 6.4 6.0  NEUTROABS 4.6 3.9  HGB 9.8* 9.5*  HCT 30.7* 29.1*  MCV 109.3* 109.4*  PLT 228 527    Basic Metabolic Panel:  Lab Results  Component Value Date   NA 134 (L) 05/24/2022   K 3.9 05/24/2022   CO2 30 05/24/2022   GLUCOSE 121 (H) 05/24/2022   BUN 18 05/24/2022   CREATININE 1.18 (H) 05/24/2022   CREATININE 1.32 (H) 05/24/2022   CALCIUM 8.7 (L) 05/24/2022   GFRNONAA 52 (L) 05/24/2022   GFRNONAA 45 (L) 05/24/2022   GFRAA 50 (L) 06/04/2021   Lipid Panel:  Lab Results  Component Value Date   LDLCALC 98 05/16/2019   HgbA1c:  Lab Results  Component Value Date   HGBA1C 6.0 (H) 08/20/2020   Urine Drug Screen:     Component Value Date/Time   LABOPIA NONE DETECTED 10/22/2020 1657   COCAINSCRNUR NONE DETECTED 10/22/2020 1657   LABBENZ NONE DETECTED 10/22/2020 1657   AMPHETMU NONE DETECTED 10/22/2020 1657   THCU NONE DETECTED 10/22/2020 1657   LABBARB NONE DETECTED 10/22/2020 1657    Alcohol Level     Component Value Date/Time   ETH <10 10/22/2020 1546      Impression   This is a 66 yo woman with hx stage 4 bladder cancer, EtOH dependence, CKD stage III, hepatitis C, HTN, HL, prior stroke with residual L sided weakness admitted to [facility] for bowel obstruction 2/2 metastatic bladder cancer. LKW 1630 when she was walking to the bathroom. Subsequently she was attempting to drink from a cup she was holding in her L hand and dropped the cup and there was concern she had acute worsening of her chronic L sided weakness as a result. On examination she is encephalopathic, unable to answer orientation questions, and requires repeated prompting to follow commands. She is diffusely weak with drift in all extremities though slightly more prominent in the LLE. She has prominent asterixis which is likely why she dropped the cup. Per husband at bedside, the encephalopathy is new today. Exam is consistent with encephalopathy overall, with some mild asymmetry of weakness LLE>RLE which appears to be at or near her baseline asymmetry 2/2 prior stroke. Favor encephalopathy 2/2 metabolic or infectious etiology. Thrombolytics not administered 2/2 low suspicion for stroke.  Recommendations   - D/w on call physician at [place] metabolic and infectious workup for acute change in mental status - If no cause for the mental status change can be identified it would be reasonable to perform a brain MRI, although suspicion for stroke is low - For any questions tomorrow please page the neurohospitalist at [facility] ______________________________________________________________________   Thank you for the opportunity to take part in the care of this patient. If you have any further questions, please contact the neurology consultation attending.  Signed,  Su Monks, MD [facility] [phone]  If 7pm- 7am, please page neurology on call as listed in [place].

## 2022-05-24 NOTE — Progress Notes (Signed)
Code Stroke activated @ 3953 from CT.  Inpt admitted 6/11 with SBO.  Hx of CVA.  LKWT 1630.  ROS 1700.  Dr.Stack paged @ 1845 and on camera @ 1850.  NO TNK per Dr. Quinn Axe.

## 2022-05-24 NOTE — Progress Notes (Signed)
PROGRESS NOTE    Mary Stark  YCX:[id number] DOB: 1956/01/27 DOA: 05/23/2022 PCP: [name], NP   Brief Narrative:  Mary Stark is a 67 y.o. 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 ED Course: Vitals: afeb, bp 124/77, hr 65, rr 16sat 94% on ra UA:wbc 21-50 large LE, no bacteria, NA:134, K 4.1, cr 1.41 at baseline, albumin 3.4, Wbc:6.4, hgb 9.8, mcv 109, plt 228 Ct abd/pelvis:Worsening circumferential irregular wall thickening in the distal ascending colon near the hepatic flexure which is now causing obstruction with dilated cecum and ascending colon proximal to this lesion as well as dilated fluid-filled small bowel loops. 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 Bilateral ureteral stents remain in place with moderate left hydronephrosis, unchanged since prior study. Improving right hydronephrosis.  Patient received morphine and Zofran in the ED.  General surgery consulted.  [facility] hospitalist consulted for admission for further evaluation and management.  Assessment & Plan:  Colon obstruction secondary to metastatic bladder cancer: -General surgery on board-she will likely require resection during this admission.  Patient will be evaluated by Dr. Georgette Dover tomorrow. -Continue IV fluids,, IV Rocephin, continue clear liquid diet for now.  As needed pain medication.  As needed Zofran and Compazine as needed for nausea and vomiting. -General surgery to obtain results of the biopsies which were taken at the time of her colonoscopy 2 weeks ago -GI, oncology on board  Stage IV adenocarcinoma of bladder: -On chemo FOLFOX every 14 days.  Complicated by urinary obstruction s/p urinary stents due to bilateral hydronephrosis.  She is on prophylactic  antibiotics due to stents for prevention of UTI.  She is followed by urology Dr. Bonney Aid oncology Dr. Alen Blew outpatient.  Hyponatremia: Mild.  Continue to monitor  CKD stage IIIa: -Baseline creatinine: 1.4.  Continue gentle hydration and monitor kidney function closely.  Avoid nephrotoxic medications.  Macrocytic anemia:  -H&H is currently stable.  Monitor H&H closely  GERD: Continue PPI  Anxiety: Continue Ativan as needed  DVT prophylaxis: Heparin Code Status: Full code Family Communication: Patient's husband present at bedside.  Plan of care discussed with patient in length and she verbalized understanding and agreed with it. Disposition Plan: To be determined  Consultants:  General surgery Oncology GI  Procedures:  None  Antimicrobials:  Rocephin  Status is: Inpatient      Subjective: Patient seen and examined.  Husband at the bedside.  Had an episode of vomiting this morning and Zofran was not helpful.  She continues to have abdominal pain.  She has not been passing gas however had bowel movement yesterday.  No fever, chills.  Objective: Vitals:   05/24/22 0100 05/24/22 0200 05/24/22 0313 05/24/22 0815  BP: 121/75 113/74 128/78 115/62  Pulse: 73 65 69 72  Resp: (!) '23 12 18 16  '$ Temp:  49.7 F (36.8 C) 98.1 F (36.7 C) 98.4 F (36.9 C)  TempSrc:   Oral Oral  SpO2: 94% 94% 98% (!) 89%  Weight:      Height:       No intake or output data in the 24 hours ending 05/24/22 0956 Filed Weights   05/23/22 2139  Weight: 77.1 kg    Examination:  General exam: Appears calm and comfortable, on room air, communicating well, spouse at the  bedside Respiratory system: Clear to auscultation. Respiratory effort normal. Cardiovascular system: S1 & S2 heard, RRR. No JVD, murmurs, rubs, gallops or clicks. No pedal edema. Gastrointestinal system: Abdomen is firm, generalized abdominal tenderness positive  Central nervous system: Alert and oriented. No focal neurological  deficits. Extremities: Symmetric 5 x 5 power. Skin: No rashes, lesions or ulcers Psychiatry: Judgement and insight appear normal. Mood & affect appropriate.    Data Reviewed: I have personally reviewed following labs and imaging studies  CBC: Recent Labs  Lab 05/22/22 1255 05/23/22 2250 05/24/22 0659  WBC 5.7 6.4 6.0  NEUTROABS 3.5 4.6 3.9  HGB 8.6* 9.8* 9.5*  HCT 26.7* 30.7* 29.1*  MCV 111.3* 109.3* 109.4*  PLT 158 228 622   Basic Metabolic Panel: Recent Labs  Lab 05/22/22 1255 05/23/22 2250 05/24/22 0659  NA 133* 134* 134*  K 5.4* 4.1 3.9  CL 102 94* 92*  CO2 '26 28 30  '$ GLUCOSE 90 135* 121*  BUN '15 17 18  '$ CREATININE 1.31* 1.41* 1.32*  CALCIUM 8.7* 8.7* 8.7*   GFR: Estimated Creatinine Clearance: 45.1 mL/min (A) (by C-G formula based on SCr of 1.32 mg/dL (H)). Liver Function Tests: Recent Labs  Lab 05/22/22 1255 05/23/22 2250 05/24/22 0659  AST 10* 17 16  ALT '5 9 10  '$ ALKPHOS 77 80 80  BILITOT 0.4 0.6 0.7  PROT 6.3* 6.9 6.7  ALBUMIN 3.5 3.4* 3.4*   Recent Labs  Lab 05/23/22 2250  LIPASE 19   No results for input(s): "AMMONIA" in the last 168 hours. Coagulation Profile: No results for input(s): "INR", "PROTIME" in the last 168 hours. Cardiac Enzymes: No results for input(s): "CKTOTAL", "CKMB", "CKMBINDEX", "TROPONINI" in the last 168 hours. BNP (last 3 results) No results for input(s): "PROBNP" in the last 8760 hours. HbA1C: No results for input(s): "HGBA1C" in the last 72 hours. CBG: No results for input(s): "GLUCAP" in the last 168 hours. Lipid Profile: No results for input(s): "CHOL", "HDL", "LDLCALC", "TRIG", "CHOLHDL", "LDLDIRECT" in the last 72 hours. Thyroid Function Tests: Recent Labs    05/24/22 0659  TSH 3.336   Anemia Panel: No results for input(s): "VITAMINB12", "FOLATE", "FERRITIN", "TIBC", "IRON", "RETICCTPCT" in the last 72 hours. Sepsis Labs: No results for input(s): "PROCALCITON", "LATICACIDVEN" in the last 168 hours.  No  results found for this or any previous visit (from the past 240 hour(s)).    Radiology Studies: CT ABDOMEN PELVIS W CONTRAST  Result Date: 05/23/2022 CLINICAL DATA:  Abdominal pain, acute, nonlocalized EXAM: CT ABDOMEN AND PELVIS WITH CONTRAST TECHNIQUE: Multidetector CT imaging of the abdomen and pelvis was performed using the standard protocol following bolus administration of intravenous contrast. RADIATION DOSE REDUCTION: This exam was performed according to the departmental dose-optimization program which includes automated exposure control, adjustment of the mA and/or kV according to patient size and/or use of iterative reconstruction technique. CONTRAST:  184m OMNIPAQUE IOHEXOL 300 MG/ML  SOLN COMPARISON:  04/21/2022 FINDINGS: Lower chest: No acute abnormality Hepatobiliary: No focal hepatic abnormality. Gallbladder unremarkable. Pancreas: No focal abnormality or ductal dilatation. Spleen: No focal abnormality.  Normal size. Adrenals/Urinary Tract: Bilateral ureteral stents are in place, as seen on prior study. Moderate left hydronephrosis is stable. No hydronephrosis on the right. Scarring and cortical thinning in the upper pole of the left kidney. No adrenal mass. Urinary bladder unremarkable. Stomach/Bowel: Again noted is the area of circumferential wall thickening in the distal ascending colon near the hepatic flexure as seen on prior imaging. This appears to have worsened since  prior study. Now, there is significant distention of the cecum and ascending colon proximal to this lesion, and small bowel loops are dilated and fluid-filled. Findings are suspicious for obstructing colon cancer. Vascular/Lymphatic: Aortic atherosclerosis. No evidence of aneurysm or adenopathy. Small scattered retroperitoneal lymph nodes again noted, slightly improved since prior study. Reproductive: Uterus and adnexa unremarkable.  No mass. Other: Mild ascites, best seen adjacent to the liver and in the right paracolic  gutter. Musculoskeletal: No acute bony abnormality. IMPRESSION: Worsening circumferential irregular wall thickening in the distal ascending colon near the hepatic flexure which is now causing obstruction with dilated cecum and ascending colon proximal to this lesion as well as dilated fluid-filled small bowel loops. 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 Bilateral ureteral stents remain in place with moderate left hydronephrosis, unchanged since prior study. Improving right hydronephrosis. Mild ascites. Electronically Signed   By: Rolm Baptise M.D.   On: 05/23/2022 23:43    Scheduled Meds:  cycloSPORINE  1 drop Both Eyes Daily   heparin  5,000 Units Subcutaneous Q8H   ondansetron (ZOFRAN) IV  4 mg Intravenous Once   pantoprazole (PROTONIX) IV  40 mg Intravenous Q24H   Continuous Infusions:  sodium chloride 125 mL/hr at 05/24/22 0425   acetaminophen     cefTRIAXone (ROCEPHIN)  IV 1 g (05/24/22 0508)     LOS: 0 days   Time spent: 40 minutes   Prentis Langdon Loann Quill, MD [facility]  If 7PM-7AM, please contact night-coverage [email or web address] 05/24/2022, 9:56 AM

## 2022-05-25 ENCOUNTER — Other Ambulatory Visit: Payer: Self-pay

## 2022-05-25 ENCOUNTER — Inpatient Hospital Stay (HOSPITAL_COMMUNITY): Payer: No Typology Code available for payment source

## 2022-05-25 ENCOUNTER — Inpatient Hospital Stay (HOSPITAL_COMMUNITY): Payer: No Typology Code available for payment source | Admitting: Certified Registered"

## 2022-05-25 ENCOUNTER — Encounter (HOSPITAL_COMMUNITY): Payer: Self-pay | Admitting: Internal Medicine

## 2022-05-25 ENCOUNTER — Encounter (HOSPITAL_COMMUNITY)
Admission: EM | Disposition: A | Discharge status: Home / Self Care | Payer: Self-pay | Source: Home / Self Care | Attending: Internal Medicine

## 2022-05-25 DIAGNOSIS — Z87891 Personal history of nicotine dependence: Secondary | ICD-10-CM

## 2022-05-25 DIAGNOSIS — K759 Inflammatory liver disease, unspecified: Secondary | ICD-10-CM

## 2022-05-25 DIAGNOSIS — K6389 Other specified diseases of intestine: Secondary | ICD-10-CM

## 2022-05-25 DIAGNOSIS — N289 Disorder of kidney and ureter, unspecified: Secondary | ICD-10-CM

## 2022-05-25 DIAGNOSIS — I10 Essential (primary) hypertension: Secondary | ICD-10-CM

## 2022-05-25 DIAGNOSIS — Z8719 Personal history of other diseases of the digestive system: Secondary | ICD-10-CM

## 2022-05-25 HISTORY — PX: PARTIAL COLECTOMY: SHX5273

## 2022-05-25 HISTORY — DX: Personal history of other diseases of the digestive system: Z87.19

## 2022-05-25 LAB — CBC
HCT: 27.7 % — ABNORMAL LOW (ref 36.0–46.0)
Hemoglobin: 8.8 g/dL — ABNORMAL LOW (ref 12.0–15.0)
MCH: 35.6 pg — ABNORMAL HIGH (ref 26.0–34.0)
MCHC: 31.8 g/dL (ref 30.0–36.0)
MCV: 112.1 fL — ABNORMAL HIGH (ref 80.0–100.0)
Platelets: 204 10*3/uL (ref 150–400)
RBC: 2.47 MIL/uL — ABNORMAL LOW (ref 3.87–5.11)
RDW: 15.9 % — ABNORMAL HIGH (ref 11.5–15.5)
WBC: 6.8 10*3/uL (ref 4.0–10.5)
nRBC: 0 % (ref 0.0–0.2)

## 2022-05-25 LAB — BASIC METABOLIC PANEL
Anion gap: 7 (ref 5–15)
BUN: 11 mg/dL (ref 8–23)
CO2: 26 mmol/L (ref 22–32)
Calcium: 8.3 mg/dL — ABNORMAL LOW (ref 8.9–10.3)
Chloride: 105 mmol/L (ref 98–111)
Creatinine, Ser: 1.18 mg/dL — ABNORMAL HIGH (ref 0.44–1.00)
GFR, Estimated: 52 mL/min — ABNORMAL LOW (ref >60)
Glucose, Bld: 120 mg/dL — ABNORMAL HIGH (ref 70–99)
Potassium: 3.7 mmol/L (ref 3.5–5.1)
Sodium: 138 mmol/L (ref 135–145)

## 2022-05-25 LAB — PREALBUMIN: Prealbumin: 9.2 mg/dL — ABNORMAL LOW (ref 18–38)

## 2022-05-25 LAB — TYPE AND SCREEN
ABO/RH(D): A POS
Antibody Screen: NEGATIVE

## 2022-05-25 LAB — URINE CULTURE: Culture: <10000 — AB

## 2022-05-25 LAB — HEMOGLOBIN A1C
Hgb A1c MFr Bld: 5.5 % (ref 4.8–5.6)
Mean Plasma Glucose: 111.15 mg/dL

## 2022-05-25 LAB — PHOSPHORUS: Phosphorus: 2.5 mg/dL (ref 2.5–4.6)

## 2022-05-25 LAB — MAGNESIUM: Magnesium: 2.3 mg/dL (ref 1.7–2.4)

## 2022-05-25 SURGERY — COLECTOMY, PARTIAL
Anesthesia: General | Laterality: Right

## 2022-05-25 MED ORDER — PHENYLEPHRINE HCL-NACL 20-0.9 MG/250ML-% IV SOLN
INTRAVENOUS | Status: DC | PRN
  Administered 2022-05-25: 40 ug/min via INTRAVENOUS

## 2022-05-25 MED ORDER — HYDROMORPHONE HCL 1 MG/ML IJ SOLN
INTRAMUSCULAR | Status: AC
  Filled 2022-05-25: qty 1

## 2022-05-25 MED ORDER — MIDAZOLAM HCL 5 MG/5ML IJ SOLN
INTRAMUSCULAR | Status: DC | PRN
  Administered 2022-05-25: 2 mg via INTRAVENOUS

## 2022-05-25 MED ORDER — 0.9 % SODIUM CHLORIDE (POUR BTL) OPTIME
TOPICAL | Status: DC | PRN
  Administered 2022-05-25: 1000 mL

## 2022-05-25 MED ORDER — DEXAMETHASONE SODIUM PHOSPHATE 10 MG/ML IJ SOLN
INTRAMUSCULAR | Status: DC | PRN
  Administered 2022-05-25: 4 mg via INTRAVENOUS

## 2022-05-25 MED ORDER — SUGAMMADEX SODIUM 200 MG/2ML IV SOLN
INTRAVENOUS | Status: DC | PRN
  Administered 2022-05-25: 200 mg via INTRAVENOUS

## 2022-05-25 MED ORDER — LACTATED RINGERS IV SOLN: INTRAVENOUS | Status: DC | PRN

## 2022-05-25 MED ORDER — LIDOCAINE 2% (20 MG/ML) 5 ML SYRINGE
INTRAMUSCULAR | Status: DC | PRN
  Administered 2022-05-25: 80 mg via INTRAVENOUS
  Administered 2022-05-25: 1.5 mg/kg/h via INTRAVENOUS

## 2022-05-25 MED ORDER — LACTATED RINGERS IV SOLN: INTRAVENOUS | Status: DC

## 2022-05-25 MED ORDER — FENTANYL CITRATE (PF) 250 MCG/5ML IJ SOLN
INTRAMUSCULAR | Status: AC
  Filled 2022-05-25: qty 5

## 2022-05-25 MED ORDER — HYDROMORPHONE HCL 2 MG/ML IJ SOLN
INTRAMUSCULAR | Status: AC
  Filled 2022-05-25: qty 1

## 2022-05-25 MED ORDER — MIDAZOLAM HCL 2 MG/2ML IJ SOLN
INTRAMUSCULAR | Status: AC
  Filled 2022-05-25: qty 2

## 2022-05-25 MED ORDER — FENTANYL CITRATE (PF) 100 MCG/2ML IJ SOLN
INTRAMUSCULAR | Status: DC | PRN
Start: 2022-05-25 — End: 2022-05-25
  Administered 2022-05-25 (×2): 50 ug via INTRAVENOUS
  Administered 2022-05-25: 100 ug via INTRAVENOUS

## 2022-05-25 MED ORDER — HYDROMORPHONE HCL 1 MG/ML IJ SOLN
1.0000 mg | INTRAMUSCULAR | Status: DC | PRN
  Administered 2022-05-25 – 2022-05-29 (×21): 1 mg via INTRAVENOUS
  Filled 2022-05-25 (×21): qty 1

## 2022-05-25 MED ORDER — ETOMIDATE 2 MG/ML IV SOLN
INTRAVENOUS | Status: DC | PRN
  Administered 2022-05-25: 12 mg via INTRAVENOUS

## 2022-05-25 MED ORDER — SUCCINYLCHOLINE CHLORIDE 200 MG/10ML IV SOSY
PREFILLED_SYRINGE | INTRAVENOUS | Status: DC | PRN
  Administered 2022-05-25: 120 mg via INTRAVENOUS

## 2022-05-25 MED ORDER — HYDROMORPHONE HCL 1 MG/ML IJ SOLN
0.2500 mg | INTRAMUSCULAR | Status: DC | PRN
  Administered 2022-05-25 (×4): 0.5 mg via INTRAVENOUS

## 2022-05-25 MED ORDER — PHENYLEPHRINE HCL-NACL 20-0.9 MG/250ML-% IV SOLN
INTRAVENOUS | Status: AC
  Filled 2022-05-25: qty 250

## 2022-05-25 MED ORDER — CHLORHEXIDINE GLUCONATE CLOTH 2 % EX PADS
6.0000 | MEDICATED_PAD | Freq: Every day | CUTANEOUS | Status: DC
  Administered 2022-05-26 – 2022-05-29 (×4): 6 via TOPICAL

## 2022-05-25 MED ORDER — ROCURONIUM BROMIDE 10 MG/ML (PF) SYRINGE
PREFILLED_SYRINGE | INTRAVENOUS | Status: DC | PRN
  Administered 2022-05-25: 50 mg via INTRAVENOUS
  Administered 2022-05-25 (×2): 20 mg via INTRAVENOUS

## 2022-05-25 SURGICAL SUPPLY — 53 items
APPLICATOR COTTON TIP 6 STRL (MISCELLANEOUS) ×2 IMPLANT
APPLICATOR COTTON TIP 6IN STRL (MISCELLANEOUS) ×4 IMPLANT
BAG COUNTER SPONGE SURGICOUNT (BAG) IMPLANT
BLADE EXTENDED COATED 6.5IN (ELECTRODE) ×1 IMPLANT
BLADE HEX COATED 2.75 (ELECTRODE) ×2 IMPLANT
BLADE SURG SZ10 CARB STEEL (BLADE) IMPLANT
CLIP TI LARGE 6 (CLIP) IMPLANT
COVER MAYO STAND STRL (DRAPES) ×2 IMPLANT
COVER SURGICAL LIGHT HANDLE (MISCELLANEOUS) ×2 IMPLANT
DRAPE LAPAROSCOPIC ABDOMINAL (DRAPES) ×2 IMPLANT
DRAPE SHEET LG 3/4 BI-LAMINATE (DRAPES) IMPLANT
DRAPE WARM FLUID 44X44 (DRAPES) ×2 IMPLANT
DRSG PAD ABDOMINAL 8X10 ST (GAUZE/BANDAGES/DRESSINGS) IMPLANT
ELECT BLADE TIP CTD 4 INCH (ELECTRODE) ×1 IMPLANT
ELECT REM PT RETURN 15FT ADLT (MISCELLANEOUS) ×2 IMPLANT
GAUZE SPONGE 4X4 12PLY STRL (GAUZE/BANDAGES/DRESSINGS) ×2 IMPLANT
GLOVE BIO SURGEON STRL SZ7 (GLOVE) ×4 IMPLANT
GLOVE BIOGEL PI IND STRL 7.0 (GLOVE) ×1 IMPLANT
GLOVE BIOGEL PI IND STRL 7.5 (GLOVE) ×2 IMPLANT
GLOVE BIOGEL PI INDICATOR 7.0 (GLOVE) ×1
GLOVE BIOGEL PI INDICATOR 7.5 (GLOVE) ×2
GOWN STRL REUS W/ TWL LRG LVL3 (GOWN DISPOSABLE) ×1 IMPLANT
GOWN STRL REUS W/TWL LRG LVL3 (GOWN DISPOSABLE) ×1
HANDLE SUCTION POOLE (INSTRUMENTS) ×1 IMPLANT
KIT BASIN OR (CUSTOM PROCEDURE TRAY) ×2 IMPLANT
KIT TURNOVER KIT A (KITS) IMPLANT
LEGGING LITHOTOMY PAIR STRL (DRAPES) IMPLANT
LIGASURE IMPACT 36 18CM CVD LR (INSTRUMENTS) ×1 IMPLANT
NS IRRIG 1000ML POUR BTL (IV SOLUTION) ×4 IMPLANT
PACK GENERAL/GYN (CUSTOM PROCEDURE TRAY) ×2 IMPLANT
RELOAD PROXIMATE 75MM BLUE (ENDOMECHANICALS) ×8 IMPLANT
RELOAD STAPLE 75 3.8 BLU REG (ENDOMECHANICALS) IMPLANT
SHEARS HARMONIC ACE PLUS 36CM (ENDOMECHANICALS) IMPLANT
STAPLER GUN LINEAR PROX 60 (STAPLE) ×1 IMPLANT
STAPLER PROXIMATE 75MM BLUE (STAPLE) ×1 IMPLANT
STAPLER VISISTAT 35W (STAPLE) ×2 IMPLANT
SUCTION POOLE HANDLE (INSTRUMENTS) ×2
SUT NOV 1 T60/GS (SUTURE) IMPLANT
SUT NOVA NAB DX-16 0-1 5-0 T12 (SUTURE) IMPLANT
SUT NOVA T20/GS 25 (SUTURE) IMPLANT
SUT PDS AB 1 TP1 96 (SUTURE) ×4 IMPLANT
SUT SILK 2 0 (SUTURE) ×1
SUT SILK 2 0 SH CR/8 (SUTURE) ×2 IMPLANT
SUT SILK 2 0SH CR/8 30 (SUTURE) IMPLANT
SUT SILK 2-0 18XBRD TIE 12 (SUTURE) ×1 IMPLANT
SUT SILK 2-0 30XBRD TIE 12 (SUTURE) IMPLANT
SUT SILK 3 0 (SUTURE) ×2
SUT SILK 3 0 SH CR/8 (SUTURE) ×3 IMPLANT
SUT SILK 3-0 18XBRD TIE 12 (SUTURE) ×2 IMPLANT
TOWEL OR 17X26 10 PK STRL BLUE (TOWEL DISPOSABLE) ×4 IMPLANT
TRAY FOLEY MTR SLVR 16FR STAT (SET/KITS/TRAYS/PACK) ×2 IMPLANT
WATER STERILE IRR 1000ML POUR (IV SOLUTION) IMPLANT
YANKAUER SUCT BULB TIP NO VENT (SUCTIONS) ×2 IMPLANT

## 2022-05-25 NOTE — Consult Note (Signed)
WOC consulted for marking over weekend, no WOC on this campus for marking. Will follow up for ostomy care if needed   [place], [place], [place]

## 2022-05-25 NOTE — Progress Notes (Signed)
Neurology Progress Note Mary Stark MR# [id number] 05/25/2022   S: seizure like event overnight events; currently no new complaints.   O: Current vital signs: BP (!) 158/86 (BP Location: Right Arm)   Pulse 91   Temp 98.5 F (36.9 C) (Oral)   Resp 16   Ht '5\' 6"'$  (1.676 m)   Wt 77.1 kg   SpO2 99%   BMI 27.44 kg/m  Vital signs in last 24 hours: Temp:  [96 F (35.6 C)-101.1 F (38.4 C)] 98.5 F (36.9 C) (06/12 1725) Pulse Rate:  [71-91] 91 (06/12 1725) Resp:  [10-18] 16 (06/12 1725) BP: (107-158)/(53-89) 158/86 (06/12 1725) SpO2:  [94 %-100 %] 99 % (06/12 1725) Weight:  [77.1 kg] 77.1 kg (06/12 0828) GENERAL: somnolent and alert in NAD HEENT: Normocephalic and atraumatic, moist mm, no LN++, no thyromegaly LUNGS: symmetric excursions bilaterally with no audible wheezes. CV: RR, equal pulses bilaterally. ABDOMEN: Soft, nontender, nondistended with normoactive BS Ext: warm, well perfused, intact peripheral pulses  NEURO:  Mental Status: AA&Ox2 somnolent Language: speech fluent repetition, and comprehension intact. PERR. EOMI, visual fields full, mild right lower face weakness, facial sensation intact, hearing intact. No evidence of tongue atrophy or fibrillations, tongue/uvula/soft palate midline elevates symmetrically  Normal sternocleidomastoid and trapezius muscle strength. Motor: good strength mildly decreased left lower extremity. Tone: Tone and bulk is normal Sensation: Intact to light touch bilaterally Coordination: FTN intact bilaterally, no ataxia in BLE. Gait - Deferred   Labs Na 138  Imaging I have reviewed images in epic and the results pertinent to this consultation are: CT Head showed no evidence of acute intracranial abnormality.  Assessment: Mary Stark is a 66 y.o. female PMHx as noted above bowel obstruction now post op. She and her husband stated that the events seem to be medication induced as they occurred right after administration of  medications. Exam highly suggestive of encephalopathy.  Recommendations: Continue metabolic and infectious workup for acute encephalopathy. Neurology will remain available, please call for questions.    Electronically signed by:  Lynnae Sandhoff, MD Page: [phone] 05/25/2022, 8:17 PM  If 7pm- 7am, please page neurology on call as listed in [place].

## 2022-05-25 NOTE — Progress Notes (Signed)
Chaplain engaged in an initial visit with Mary Stark and her husband.  Chaplain learned of her healthcare journey and offered support.  Husband was well versed in verbalizing what Mary Stark is facing.  Mary Stark was feeling some pain and unable to get comfortable after procedure.  Nurse was aware and going to get her some pain medication.    They have a good support system and community to help them.   Chaplain offered support, listening and presence.    05/25/22 1600  Clinical Encounter Type  Visited With Patient  Visit Type Initial;Spiritual support

## 2022-05-25 NOTE — Progress Notes (Signed)
PROGRESS NOTE  Mary Stark  DOB: 1956-05-01  PCP: Mary Bouche, NP VOH:[id number]  DOA: 05/23/2022  LOS: 1 day  Hospital Day: 3  Brief narrative: Mary Stark is a 66 y.o. female with PMH significant for stage IV adenocarcinoma of bladder, anemia related to chemo therapy, anxiety , CKDIII, GERD, HTN, HLD ,ETOH dependence. Patient presented to ED on 6/10 with complaint of progressive abdominal pain x 3 weeks worse over the last 3 days, started with nausea and vomiting  CT abdomen pelvis showed worsening circumferential irregular wall thickening in the distal ascending colon near the hepatic flexure causing obstruction with dilated cecum and ascending colon proximal to this lesion as well as dilated fluid-filled small bowel loops. 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 Bilateral ureteral stents remain in place with moderate left hydronephrosis, unchanged since prior study. Improving right hydronephrosis.   Admitted to hospital service . General surgery was consulted 6/12, patient underwent exploratory laparotomy and right hemicolectomy by Dr. Georgette Dover.  Subjective: Patient was seen and examined this morning prior to surgery Chart reviewed. Underwent right hemicolectomy later today  Principal Problem:   Colonic obstruction from bladder cancer metstasis to ascending colon Active Problems:   Heavy alcohol consumption   AKI (acute kidney injury) ([place])   Metastasis from bladder cancer (HCC)   CKD (chronic kidney disease) stage 3, GFR 30-59 ml/min (HCC)    Assessment and plan: Colon obstruction secondary to metastatic bladder cancer: -CT scan finding as above showing ascending colon mass causing obstruction  -Correct/12, underwent ex lap and right hemicolectomy.   -IV fluid, pain medicines  -Oncology on board.  Stage IV adenocarcinoma of bladder: -On chemo FOLFOX every 14 days.  Complicated by urinary obstruction s/p urinary stents due to bilateral hydronephrosis.  She  is on prophylactic antibiotics due to stents for prevention of UTI.  She is followed by urology and oncology as an outpatient  Hyponatremia:  -Mild.  Continue to monitor Recent Labs  Lab 05/22/22 1255 05/23/22 2250 05/24/22 0659 05/25/22 0514  NA 133* 134* 134* 138    CKD stage IIIa: -Baseline creatinine: 1.4.  Continue gentle hydration and monitor kidney function closely.  Avoid nephrotoxic medications. Recent Labs    03/11/22 0823 03/26/22 1129 04/07/22 0836 04/22/22 1036 04/28/22 0848 05/06/22 0815 05/22/22 1255 05/23/22 2250 05/24/22 0659 05/25/22 0514  BUN 24* 24* 26* 23 29* '22 15 17 18 11  '$ CREATININE 1.07* 1.24* 1.40* 1.16* 1.40* 1.40* 1.31* 1.41* 1.18*  1.32* 1.18*   Macrocytic anemia -H&H is currently stable.  Monitor H&H closely   GERD -Continue PPI   Anxiety -Continue Ativan as needed  Goals of care   Code Status: Full Code    Mobility:   Skin assessment:     Nutritional status:  Body mass index is 27.44 kg/m.          Diet:  Diet Order             Diet NPO time specified Except for: Ice Chips, Sips with Meds  Diet effective ____                   DVT prophylaxis:  heparin injection 5,000 Units Start: 05/26/22 0600   Antimicrobials: IV Rocephin Fluid: NS at 100 mill per hour Consultants: General surgery Family Communication: Husband at bedside  Status is: Inpatient  Continue in-hospital care because: Pending surgery Level of care: Telemetry   Dispo: The patient is from: Home  Anticipated d/c is to: Pending clinical course              Patient currently is not medically stable to d/c.   Difficult to place patient No     Infusions:   sodium chloride 100 mL/hr at 05/25/22 1418   cefTRIAXone (ROCEPHIN)  IV 1 g (05/25/22 0420)    Scheduled Meds:  Chlorhexidine Gluconate Cloth  6 each Topical Daily   cycloSPORINE  1 drop Both Eyes Daily   [START ON 05/26/2022] heparin injection (subcutaneous)  5,000  Units Subcutaneous Q8H   HYDROmorphone       HYDROmorphone       pantoprazole (PROTONIX) IV  40 mg Intravenous Q24H    PRN meds: albuterol, HYDROmorphone, HYDROmorphone, HYDROmorphone (DILAUDID) injection, LORazepam, [DISCONTINUED] ondansetron **OR** ondansetron (ZOFRAN) IV, prochlorperazine   Antimicrobials: Anti-infectives (From admission, onward)    Start     Dose/Rate Route Frequency Ordered Stop   05/25/22 0600  cefoTEtan (CEFOTAN) 2 g in sodium chloride 0.9 % 100 mL IVPB        2 g 200 mL/hr over 30 Minutes Intravenous On call to O.R. 05/24/22 1456 05/25/22 1015   05/24/22 2200  neomycin (MYCIFRADIN) tablet 1,000 mg  Status:  Discontinued       See Hyperspace for full Linked Orders Report.   1,000 mg Oral 3 times per day 05/24/22 1456 05/24/22 1519   05/24/22 2200  metroNIDAZOLE (FLAGYL) tablet 1,000 mg  Status:  Discontinued       See Hyperspace for full Linked Orders Report.   1,000 mg Oral 3 times per day 05/24/22 1456 05/24/22 1519   05/24/22 1700  metroNIDAZOLE (FLAGYL) tablet 1,000 mg  Status:  Discontinued       See Hyperspace for full Linked Orders Report.   1,000 mg Oral 3 times per day on Sun 05/24/22 1519 05/25/22 1327   05/24/22 1700  neomycin (MYCIFRADIN) tablet 1,000 mg  Status:  Discontinued       See Hyperspace for full Linked Orders Report.   1,000 mg Oral 3 times per day on Sun 05/24/22 1519 05/25/22 1327   05/24/22 0500  cefTRIAXone (ROCEPHIN) 1 g in sodium chloride 0.9 % 100 mL IVPB        1 g 200 mL/hr over 30 Minutes Intravenous Every 24 hours 05/24/22 0406         Objective: Vitals:   05/25/22 1330 05/25/22 1352  BP: 116/71 121/76  Pulse: 79 83  Resp: 12   Temp: (!) 97.5 F (36.4 C) (!) 97.5 F (36.4 C)  SpO2: 100% 100%    Intake/Output Summary (Last 24 hours) at 05/25/2022 1422 Last data filed at 05/25/2022 1151 Gross per 24 hour  Intake 4610.89 ml  Output 950 ml  Net 3660.89 ml   Filed Weights   05/23/22 2139 05/25/22 0828  Weight:  77.1 kg 77.1 kg   Weight change:  Body mass index is 27.44 kg/m.   Physical Exam: General exam: Pleasant, not in distress Skin: No rashes, lesions or ulcers. HEENT: Atraumatic, normocephalic, no obvious bleeding Lungs: Clear to auscultation bilaterally CVS: Regular rate and rhythm, no murmur GI/Abd soft, mild distention, bowel sound present CNS: Alert,, awake, oriented  x3 Psychiatry: Sad affect Extremities: No pedal edema, no calf tenderness  Data Review: I have personally reviewed the laboratory data and studies available.  F/u labs ordered Unresulted Labs (From admission, onward)     Start     Ordered   05/25/22 0000  Prealbumin  Weekly,   R (with TIMED occurrences)      05/24/22 0730            Signed, Terrilee Croak, MD [facility] 05/25/2022

## 2022-05-25 NOTE — Plan of Care (Addendum)
Neurology Plan of Care  Mary Stark MR# [id number] 05/25/2022  66 year old woman stage 4 bladder cancer, EtOH dependence, CKD stage III, hepatitis C, HTN, HL, prior stroke with residual L sided weakness admitted to [facility] for bowel obstruction 2/2 metastatic bladder cancer presented with encephalopathy. Consult exam was consistent with encephalopathy overall, with some mild asymmetry of weakness LLE>RLE which appears to be at or near her baseline asymmetry 2/2 prior stroke. Evaluation thus far was mostly negative and CT head did not show acute ischemic changes. Cannot completely rule out stroke as there is no MRI and her baseline is not clear. She has life threatening bowel obstruction and needs emergent surgery and benefit for surgery outweighs risk.     Electronically signed by:  Lynnae Sandhoff, MD Page: [phone] 05/25/2022, 8:35 AM

## 2022-05-25 NOTE — Op Note (Addendum)
Preoperative diagnosis: Obstructing mass of the ascending colon.   Postoperative diagnosis: same   Procedure: Exploratory Laparotomy, Right Hemicolectomy  Surgeon: Donnie Mesa, M.D.  Asst: Radonna Ricker, MD  I was present for the critical and key portions of the surgery and I was immediately available throughout the entire procedure.  I have reviewed and agree with the operative note as documented by the resident.  Anesthesia: General  Indications for procedure: Mary Stark is a 66 y.o. year old female with symptoms of obstructing colonic mass.  This is felt to represent metastatic disease from her known bladder cancer, for which she is undergoing chemotherapy.    Description of procedure: The patient was brought into the operative suite. Anesthesia was administered with General endotracheal anesthesia. WHO checklist was applied. The patient was then placed in supine position. A foley catheter was placed as well as an NG tube. The area was prepped and draped in the usual sterile fashion.   A time out was called correctly identifying the patient, the procedure as exploratory laparotomy, and the mass laterality as RIGHT.   We began by making a midline laparotomy. We safely entered the peritoneal cavity. We eviscerated the small intestine and examined it along its length.  It was normal in appearance. We examined the liver and did not appreciate obvious metastases by visualization or palpation.   We then turned our attention to the ascending colon. There was a clear caliber change just distal to the cecum. We elevated the colon and used electrocautery to take down the lateral peritoneal attachments. We carried this plane with a combination of blunt dissection and electrocautery just past the hepatic flexure. Here we encountered significant posterior adhesions.   We used the ligasure to remove the omentum from the proximal transverse colon. We were able to circumferentially dissect the  transverse colon and selected this as our initial distal margin, which we took with a blue GIA 75 stapler load. We elevated the distal margin and carried our dissection retrograde. We spent a significant amount of time freeing the colon from dense retroperitoneal adhesions to the duodenum and surrounding strucutres. Great care was taken to sweep the duodenum down to avoid injury. We were able to free the colon from these dense adhesions and externalize it along with its mesentery. We selected a proximal transection point in the distal ileum and took this with a blue GIA 75 stapler load. We then used the ligasure to take the mesentery. The specimen was passed off and identified as right colon.   We elected to take an additional distal margin of the transverse colon to allow for a tension-free anastomosis. This specimen was sent off marked with a proximal silk suture.   We confirmed hemostasis.  We then completed a side to side, functional end to end, ileocolonic stapled anastomosis with another load of the GIA-75 stapler The common defect was closed with TX-60 and oversewn with lembert sutures.   We again confirmed hemostasis.   We then changed gloves and gowns and brought out the sterile closure table.   The fascia was closed with running looped PDS suture.   The skin was closed with staples.   All counts were correct at the end of the case.   Findings: Ascending colonic mass with dense retroperitoneal adhesions.   Specimen: (1)  Omentum (2) Right colon, (3) Additional distal margin of transverse colon  Implant: None   Blood loss: 239m  Local anesthesia: None  Complications: None  HRadonna Ricker M.D.  PGY-5 [facility]  Imogene Burn. Georgette Dover, MD, [facility]  General Surgery   05/25/2022 11:59 AM

## 2022-05-25 NOTE — Progress Notes (Signed)
RN expressed concern and notified Dr. Nyoka Cowden and Dr. Georgette Dover of patient Code Stroke last night prior to surgery.  No further orders given.  Plan to proceed per Dr. Georgette Dover.

## 2022-05-25 NOTE — Anesthesia Postprocedure Evaluation (Signed)
Anesthesia Post Note  Patient: Mary Stark  Procedure(s) Performed: EXPLORATORY LAPAROTOMY RIGHT HEMI COLECTOMY PARTIAL OMENTECTOMY (Right)     Patient location during evaluation: PACU Anesthesia Type: General Level of consciousness: awake Pain management: pain level controlled Vital Signs Assessment: post-procedure vital signs reviewed and stable Respiratory status: spontaneous breathing Cardiovascular status: stable Postop Assessment: no apparent nausea or vomiting Anesthetic complications: no   No notable events documented.  Last Vitals:  Vitals:   05/25/22 1215 05/25/22 1230  BP: 109/89   Pulse: 71 72  Resp: 11 11  Temp:    SpO2: 100% 100%    Last Pain:  Vitals:   05/25/22 1215  TempSrc:   PainSc: 8                  [name]

## 2022-05-25 NOTE — Progress Notes (Signed)
Rapid Response Note    Event Summary:    Patient follow up from code stroke.  CT Head was negative.  Meliane  RN [phone] states patient speak is get clearer at this time.  MD Notified: Pahwani Call Time: 3295 Arrival Time: 1884 End Time: 1910   Jerrye Bushy, RN

## 2022-05-25 NOTE — Transfer of Care (Signed)
Immediate Anesthesia Transfer of Care Note  Patient: Mary Stark  Procedure(s) Performed: EXPLORATORY LAPAROTOMY RIGHT HEMI COLECTOMY PARTIAL OMENTECTOMY (Right)  Patient Location: PACU  Anesthesia Type:General  Level of Consciousness: awake, alert , oriented and patient cooperative  Airway & Oxygen Therapy: Patient Spontanous Breathing and Patient connected to face mask oxygen  Post-op Assessment: Report given to RN and Post -op Vital signs reviewed and stable  Post vital signs: Reviewed and stable  Last Vitals:  Vitals Value Taken Time  BP 123/66 05/25/22 1207  Temp    Pulse 71 05/25/22 1207  Resp 10 05/25/22 1212  SpO2 94 % 05/25/22 1207  Vitals shown include unvalidated device data.  Last Pain:  Vitals:   05/25/22 0828  TempSrc:   PainSc: 0-No pain         Complications: No notable events documented.

## 2022-05-25 NOTE — Addendum Note (Signed)
Addendum  created 05/25/22 1301 by Cleda Daub, CRNA   Charge Capture section accepted, Visit diagnoses modified

## 2022-05-25 NOTE — Anesthesia Preprocedure Evaluation (Addendum)
Anesthesia Evaluation  Patient identified by MRN, date of birth, ID band Patient awake    Reviewed: Allergy & Precautions, NPO status   Airway Mallampati: II  TM Distance: >3 FB     Dental   Pulmonary pneumonia, Patient abstained from smoking., former smoker,    breath sounds clear to auscultation       Cardiovascular hypertension,  Rhythm:Regular Rate:Normal     Neuro/Psych PSYCHIATRIC DISORDERS  Neuromuscular disease    GI/Hepatic GERD  ,(+) Hepatitis -  Endo/Other    Renal/GU Renal disease     Musculoskeletal  (+) Arthritis ,   Abdominal   Peds  Hematology   Anesthesia Other Findings   Reproductive/Obstetrics                            Anesthesia Physical Anesthesia Plan  ASA: 3  Anesthesia Plan: General   Post-op Pain Management:    Induction: Intravenous  PONV Risk Score and Plan: 3 and Ondansetron  Airway Management Planned: Oral ETT  Additional Equipment:   Intra-op Plan:   Post-operative Plan: Extubation in OR  Informed Consent: I have reviewed the patients History and Physical, chart, labs and discussed the procedure including the risks, benefits and alternatives for the proposed anesthesia with the patient or authorized representative who has indicated his/her understanding and acceptance.     Dental advisory given  Plan Discussed with: CRNA and Anesthesiologist  Anesthesia Plan Comments:        Anesthesia Quick Evaluation

## 2022-05-25 NOTE — Anesthesia Procedure Notes (Signed)
Procedure Name: Intubation Date/Time: 05/25/2022 9:44 AM  Performed by: Cleda Daub, CRNAPre-anesthesia Checklist: Patient identified, Emergency Drugs available, Suction available and Patient being monitored Patient Re-evaluated:Patient Re-evaluated prior to induction Oxygen Delivery Method: Circle system utilized Preoxygenation: Pre-oxygenation with 100% oxygen Induction Type: IV induction Ventilation: Mask ventilation without difficulty Laryngoscope Size: Mac and 3 Grade View: Grade I Tube type: Oral Tube size: 7.0 mm Number of attempts: 1 Airway Equipment and Method: Stylet and Oral airway Placement Confirmation: ETT inserted through vocal cords under direct vision, positive ETCO2 and breath sounds checked- equal and bilateral Secured at: 21 cm Tube secured with: Tape Dental Injury: Teeth and Oropharynx as per pre-operative assessment

## 2022-05-26 ENCOUNTER — Encounter (HOSPITAL_COMMUNITY): Payer: Self-pay | Admitting: Surgery

## 2022-05-26 LAB — MAGNESIUM: Magnesium: 1.8 mg/dL (ref 1.7–2.4)

## 2022-05-26 LAB — CBC
HCT: 25.6 % — ABNORMAL LOW (ref 36.0–46.0)
Hemoglobin: 8.1 g/dL — ABNORMAL LOW (ref 12.0–15.0)
MCH: 35.8 pg — ABNORMAL HIGH (ref 26.0–34.0)
MCHC: 31.6 g/dL (ref 30.0–36.0)
MCV: 113.3 fL — ABNORMAL HIGH (ref 80.0–100.0)
Platelets: 216 10*3/uL (ref 150–400)
RBC: 2.26 MIL/uL — ABNORMAL LOW (ref 3.87–5.11)
RDW: 16 % — ABNORMAL HIGH (ref 11.5–15.5)
WBC: 7.7 10*3/uL (ref 4.0–10.5)
nRBC: 0 % (ref 0.0–0.2)

## 2022-05-26 LAB — BASIC METABOLIC PANEL
Anion gap: 7 (ref 5–15)
BUN: 12 mg/dL (ref 8–23)
CO2: 25 mmol/L (ref 22–32)
Calcium: 7.6 mg/dL — ABNORMAL LOW (ref 8.9–10.3)
Chloride: 104 mmol/L (ref 98–111)
Creatinine, Ser: 1.05 mg/dL — ABNORMAL HIGH (ref 0.44–1.00)
GFR, Estimated: 59 mL/min — ABNORMAL LOW (ref >60)
Glucose, Bld: 106 mg/dL — ABNORMAL HIGH (ref 70–99)
Potassium: 3.9 mmol/L (ref 3.5–5.1)
Sodium: 136 mmol/L (ref 135–145)

## 2022-05-26 LAB — PHOSPHORUS: Phosphorus: 3.3 mg/dL (ref 2.5–4.6)

## 2022-05-26 MED ORDER — ACETAMINOPHEN 325 MG PO TABS
650.0000 mg | ORAL_TABLET | Freq: Four times a day (QID) | ORAL | Status: DC
  Administered 2022-05-26 (×3): 650 mg via ORAL
  Filled 2022-05-26 (×3): qty 2

## 2022-05-26 MED FILL — Dexamethasone Sodium Phosphate Inj 100 MG/10ML: INTRAMUSCULAR | Qty: 1 | Status: AC

## 2022-05-26 NOTE — Progress Notes (Signed)
1 Day Post-Op   Subjective/Chief Complaint: Patient was mildly confused when waking up this morning and was pulling at NG tube, but NG tube is still in place and functioning Required regular Dilaudid dosing overnight No flatus or BM yet Labs pending   Objective: Vital signs in last 24 hours: Temp:  [96 F (35.6 C)-99.4 F (37.4 C)] 98.7 F (37.1 C) (06/13 0548) Pulse Rate:  [71-103] 101 (06/13 0548) Resp:  [10-18] 18 (06/13 0548) BP: (107-169)/(53-89) 169/85 (06/13 0548) SpO2:  [89 %-100 %] 100 % (06/13 0548) Weight:  [77.1 kg] 77.1 kg (06/12 0828) Last BM Date : 05/23/22  Intake/Output from previous day: 06/12 0701 - 06/13 0700 In: 1600 [I.V.:1500; IV Piggyback:100] Out: 2200 [Urine:850; Emesis/NG output:700; Blood:150] Intake/Output this shift: No intake/output data recorded.  General appearance: alert, cooperative, and no distress GI: soft, minimal distention, quiet; incisional tenderness Incision/Wound: honeycomb dressing with minimal staining  Lab Results:  Recent Labs    05/24/22 0659 05/25/22 0514  WBC 6.0 6.8  HGB 9.5* 8.8*  HCT 29.1* 27.7*  PLT 214 204   BMET Recent Labs    05/24/22 0659 05/25/22 0514  NA 134* 138  K 3.9 3.7  CL 92* 105  CO2 30 26  GLUCOSE 121* 120*  BUN 18 11  CREATININE 1.18*  1.32* 1.18*  CALCIUM 8.7* 8.3*   PT/INR No results for input(s): "LABPROT", "INR" in the last 72 hours. ABG No results for input(s): "PHART", "HCO3" in the last 72 hours.  Invalid input(s): "PCO2", "PO2"  Studies/Results: DG Abd 1 View  Result Date: 05/25/2022 CLINICAL DATA:  Concern for retained foreign body EXAM: ABDOMEN - 1 VIEW COMPARISON:  None Available. FINDINGS: Nonobstructive pattern of bowel gas with gas present to the rectum. Status post midline laparotomy. Bilateral double-J ureteral stents with formed pigtails in the expected vicinity of the renal pelves and bladder. No unexpected radiopaque foreign body. No obvious free air in the  abdomen. IMPRESSION: No unexpected radiopaque foreign body matching provided description of 1 inch x 1 inch metal square. Findings reported by telephone to operating room to [facility] RN, 12:01 p.m., 05/25/2022 Electronically Signed   By: Delanna Ahmadi M.D.   On: 05/25/2022 12:05   CT HEAD WO CONTRAST  Result Date: 05/24/2022 CLINICAL DATA:  Neuro deficit, acute, stroke suspected. EXAM: CT HEAD WITHOUT CONTRAST TECHNIQUE: Contiguous axial images were obtained from the base of the skull through the vertex without intravenous contrast. RADIATION DOSE REDUCTION: This exam was performed according to the departmental dose-optimization program which includes automated exposure control, adjustment of the mA and/or kV according to patient size and/or use of iterative reconstruction technique. COMPARISON:  Head CT 10/22/2020 FINDINGS: Brain: There is no evidence of an acute infarct, intracranial hemorrhage, mass, midline shift, or extra-axial fluid collection. Hypodensities in the cerebral white matter bilaterally are unchanged and nonspecific but compatible with mild chronic small vessel ischemic disease. The ventricles and sulci are within normal limits for age. Vascular: No hyperdense vessel. Skull: No fracture or suspicious osseous lesion. Sinuses/Orbits: Paranasal sinuses and mastoid air cells are clear. Unremarkable orbits. Other: None. IMPRESSION: 1. No evidence of acute intracranial abnormality. 2. Mild chronic small vessel ischemic disease. These results were communicated to Dr. Doristine Bosworth at 6:56 pm on 05/24/2022 by text page via the [facility] messaging system. Electronically Signed   By: Logan Bores M.D.   On: 05/24/2022 18:57   DG Chest 2 View  Result Date: 05/24/2022 CLINICAL DATA:  Productive cough and shortness of  breath. EXAM: CHEST - 2 VIEW COMPARISON:  08/13/2021 FINDINGS: Low volume film. Basilar atelectasis noted with tiny left pleural effusion. The cardiopericardial silhouette is within normal limits  for size. Right Port-A-Cath again noted. Bones are diffusely demineralized. IMPRESSION: Low volume film with basilar atelectasis and tiny left pleural effusion. Electronically Signed   By: Misty Stanley M.D.   On: 05/24/2022 11:58    Anti-infectives: Anti-infectives (From admission, onward)    Start     Dose/Rate Route Frequency Ordered Stop   05/25/22 0600  cefoTEtan (CEFOTAN) 2 g in sodium chloride 0.9 % 100 mL IVPB        2 g 200 mL/hr over 30 Minutes Intravenous On call to O.R. 05/24/22 1456 05/25/22 1030   05/24/22 2200  neomycin (MYCIFRADIN) tablet 1,000 mg  Status:  Discontinued       See Hyperspace for full Linked Orders Report.   1,000 mg Oral 3 times per day 05/24/22 1456 05/24/22 1519   05/24/22 2200  metroNIDAZOLE (FLAGYL) tablet 1,000 mg  Status:  Discontinued       See Hyperspace for full Linked Orders Report.   1,000 mg Oral 3 times per day 05/24/22 1456 05/24/22 1519   05/24/22 1700  metroNIDAZOLE (FLAGYL) tablet 1,000 mg  Status:  Discontinued       See Hyperspace for full Linked Orders Report.   1,000 mg Oral 3 times per day on Sun 05/24/22 1519 05/25/22 1327   05/24/22 1700  neomycin (MYCIFRADIN) tablet 1,000 mg  Status:  Discontinued       See Hyperspace for full Linked Orders Report.   1,000 mg Oral 3 times per day on Sun 05/24/22 1519 05/25/22 1327   05/24/22 0500  cefTRIAXone (ROCEPHIN) 1 g in sodium chloride 0.9 % 100 mL IVPB        1 g 200 mL/hr over 30 Minutes Intravenous Every 24 hours 05/24/22 0406         Assessment/Plan: Stage IV adenocarcinoma of the bladder Colon mass - metastatic bladder cancer causing obstruction S/p open right hemicolectomy/ partial omentectomy with primary ileocolic anastomosis 9/52/84 - Dr. Georgette Dover   Pain control - add scheduled Tylenol q6 for 72 hours Continue Dilaudid IV Abdominal binder Awaiting return of bowel function - continue NG tube; ice chips/ sips with meds D/C Foley  PT/ OT - encourage ambulation and  mobilization  Neurologic issues - ongoing work-up by [facility]/ Neurology   LOS: 2 days    Maia Petties 05/26/2022

## 2022-05-26 NOTE — Discharge Instructions (Signed)
[facility]      [facility] °[phone] ° °OPEN ABDOMINAL SURGERY: POST OP INSTRUCTIONS ° °Always review your discharge instruction sheet given to you by the facility where your surgery was performed. ° °IF YOU HAVE DISABILITY OR FAMILY LEAVE FORMS, YOU MUST BRING THEM TO THE OFFICE FOR PROCESSING.  PLEASE DO NOT GIVE THEM TO YOUR DOCTOR. ° °A prescription for pain medication may be given to you upon discharge.  Take your pain medication as prescribed, if needed.  If narcotic pain medicine is not needed, then you may take acetaminophen (Tylenol) or ibuprofen (Advil) as needed. °Take your usually prescribed medications unless otherwise directed. °If you need a refill on your pain medication, please contact your pharmacy. They will contact our office to request authorization.  Prescriptions will not be filled after 5pm or on week-ends. °You should follow a light diet the first few days after arrival home, such as soup and crackers, pudding, etc.unless your doctor has advised otherwise. A high-fiber, low fat diet can be resumed as tolerated.   Be sure to include lots of fluids daily. Most patients will experience some swelling and bruising on the chest and neck area.  Ice packs will help.  Swelling and bruising can take several days to resolve °Most patients will experience some swelling and bruising in the area of the incision. Ice pack will help. Swelling and bruising can take several days to resolve..  °It is common to experience some constipation if taking pain medication after surgery.  Increasing fluid intake and taking a stool softener will usually help or prevent this problem from occurring.  A mild laxative (Milk of Magnesia or Miralax) should be taken according to package directions if there are no bowel movements after 48 hours. ° You may have steri-strips (small skin tapes) in place directly over the incision.  These strips should be left on the skin for 7-10 days.  If your surgeon used skin  glue on the incision, you may shower in 24 hours.  The glue will flake off over the next 2-3 weeks.  Any sutures or staples will be removed at the office during your follow-up visit. You may find that a light gauze bandage over your incision may keep your staples from being rubbed or pulled. You may shower and replace the bandage daily. °ACTIVITIES:  You may resume regular (light) daily activities beginning the next day--such as daily self-care, walking, climbing stairs--gradually increasing activities as tolerated.  You may have sexual intercourse when it is comfortable.  Refrain from any heavy lifting or straining until approved by your doctor. °You may drive when you no longer are taking prescription pain medication, you can comfortably wear a seatbelt, and you can safely maneuver your car and apply brakes ° °You should see your doctor in the office for a follow-up appointment approximately two weeks after your surgery.  Make sure that you call for this appointment within a day or two after you arrive home to insure a convenient appointment time. ° °WHEN TO CALL YOUR DOCTOR: °Fever over 101.0 °Inability to urinate °Nausea and/or vomiting °Extreme swelling or bruising °Continued bleeding from incision. °Increased pain, redness, or drainage from the incision. °Difficulty swallowing or breathing °Muscle cramping or spasms. °Numbness or tingling in hands or feet or around lips. ° °The clinic staff is available to answer your questions during regular business hours.  Please don’t hesitate to call and ask to speak to one of the nurses if you have concerns. ° °For   further questions, please visit [email or web address]  °

## 2022-05-26 NOTE — Progress Notes (Signed)
PROGRESS NOTE  Mary Stark  DOB: [birth year]  PCP: Mary Bouche, NP OYD:[id number]  DOA: 05/23/2022  LOS: 2 days  Hospital Day: 4  Brief narrative: Mary Stark is a 66 y.o. female with PMH significant for stage IV adenocarcinoma of bladder, anemia related to chemo therapy, anxiety , CKDIII, GERD, HTN, HLD ,ETOH dependence. Patient presented to ED on 6/10 with complaint of progressive abdominal pain x 3 weeks worse over the last 3 days, started with nausea and vomiting  CT abdomen pelvis showed worsening circumferential irregular wall thickening in the distal ascending colon near the hepatic flexure causing obstruction with dilated cecum and ascending colon proximal to this lesion as well as dilated fluid-filled small bowel loops. 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 Bilateral ureteral stents remain in place with moderate left hydronephrosis, unchanged since prior study. Improving right hydronephrosis.   Admitted to hospital service . General surgery was consulted 6/12, patient underwent exploratory laparotomy and right hemicolectomy by Dr. Georgette Dover.  Subjective: Patient was seen and examined this morning. Pleasant Caucasian female.  Sitting up in chair.  NG tube to suction.  Remains n.p.o.  Pending bowel function return.  No flatus yet  Principal Problem:   Colonic obstruction from bladder cancer metstasis to ascending colon Active Problems:   Heavy alcohol consumption   AKI (acute kidney injury) ([name])   Metastasis from bladder cancer (HCC)   CKD (chronic kidney disease) stage 3, GFR 30-59 ml/min (HCC)    Assessment and plan: Colon obstruction secondary to metastatic bladder cancer S/p open right hemicolectomy/partial omentectomy with primary ileocolic anastomosis 6/72/09 Postop ileus -Pending return of bowel function.  General surgery following.  Continue pain management with IV Dilaudid, scheduled Tylenol.  Abdominal binder.   -Increase ambulation.   -Continue  NG tube to suction.  Monitor electrolytes.   Stage IV adenocarcinoma of bladder: -On chemo FOLFOX every 14 days.  Complicated by urinary obstruction s/p urinary stents due to bilateral hydronephrosis.  She is on prophylactic antibiotics due to stents for prevention of UTI.  She is followed by urology and oncology as an outpatient  Hyponatremia -Mild.  Continue to monitor Recent Labs  Lab 05/22/22 1255 05/23/22 2250 05/24/22 0659 05/25/22 0514 05/26/22 1114  NA 133* 134* 134* 138 136    CKD stage IIIa: -Baseline creatinine: 1.4.  Continue gentle hydration and monitor kidney function closely.  Avoid nephrotoxic medications. Recent Labs    03/26/22 1129 04/07/22 0836 04/22/22 1036 04/28/22 0848 05/06/22 0815 05/22/22 1255 05/23/22 2250 05/24/22 0659 05/25/22 0514 05/26/22 1114  BUN 24* 26* 23 29* '22 15 17 18 11 12  '$ CREATININE 1.24* 1.40* 1.16* 1.40* 1.40* 1.31* 1.41* 1.18*  1.32* 1.18* 1.05*   Macrocytic anemia -H&H is currently stable.  Monitor H&H closely   GERD -Continue PPI   Anxiety -Continue Ativan as needed  Goals of care   Code Status: Full Code    Mobility:   Skin assessment:     Nutritional status:  Body mass index is 27.44 kg/m.          Diet:  Diet Order             Diet NPO time specified Except for: Ice Chips, Sips with Meds  Diet effective ____                   DVT prophylaxis:  heparin injection 5,000 Units Start: 05/26/22 0600   Antimicrobials: IV Rocephin Fluid: NS at 100 mill per hour Consultants: General surgery Family  Communication: Husband at bedside  Status is: Inpatient  Continue in-hospital care because: POD1 Level of care: Telemetry   Dispo: The patient is from: Home              Anticipated d/c is to: Pending clinical course              Patient currently is not medically stable to d/c.   Difficult to place patient No     Infusions:   sodium chloride 100 mL/hr at 05/26/22 1230   cefTRIAXone  (ROCEPHIN)  IV 1 g (05/26/22 0442)    Scheduled Meds:  acetaminophen  650 mg Oral Q6H   Chlorhexidine Gluconate Cloth  6 each Topical Daily   cycloSPORINE  1 drop Both Eyes Daily   heparin injection (subcutaneous)  5,000 Units Subcutaneous Q8H   pantoprazole (PROTONIX) IV  40 mg Intravenous Q24H    PRN meds: albuterol, HYDROmorphone (DILAUDID) injection, LORazepam, [DISCONTINUED] ondansetron **OR** ondansetron (ZOFRAN) IV, prochlorperazine   Antimicrobials: Anti-infectives (From admission, onward)    Start     Dose/Rate Route Frequency Ordered Stop   05/25/22 0600  cefoTEtan (CEFOTAN) 2 g in sodium chloride 0.9 % 100 mL IVPB        2 g 200 mL/hr over 30 Minutes Intravenous On call to O.R. 05/24/22 1456 05/25/22 1030   05/24/22 2200  neomycin (MYCIFRADIN) tablet 1,000 mg  Status:  Discontinued       See Hyperspace for full Linked Orders Report.   1,000 mg Oral 3 times per day 05/24/22 1456 05/24/22 1519   05/24/22 2200  metroNIDAZOLE (FLAGYL) tablet 1,000 mg  Status:  Discontinued       See Hyperspace for full Linked Orders Report.   1,000 mg Oral 3 times per day 05/24/22 1456 05/24/22 1519   05/24/22 1700  metroNIDAZOLE (FLAGYL) tablet 1,000 mg  Status:  Discontinued       See Hyperspace for full Linked Orders Report.   1,000 mg Oral 3 times per day on Sun 05/24/22 1519 05/25/22 1327   05/24/22 1700  neomycin (MYCIFRADIN) tablet 1,000 mg  Status:  Discontinued       See Hyperspace for full Linked Orders Report.   1,000 mg Oral 3 times per day on Sun 05/24/22 1519 05/25/22 1327   05/24/22 0500  cefTRIAXone (ROCEPHIN) 1 g in sodium chloride 0.9 % 100 mL IVPB        1 g 200 mL/hr over 30 Minutes Intravenous Every 24 hours 05/24/22 0406         Objective: Vitals:   05/25/22 2029 05/26/22 0548  BP: (!) 149/85 (!) 169/85  Pulse: (!) 103 (!) 101  Resp: 18 18  Temp: 99.4 F (37.4 C) 98.7 F (37.1 C)  SpO2: (!) 89% 100%    Intake/Output Summary (Last 24 hours) at 05/26/2022  1348 Last data filed at 05/26/2022 0500 Gross per 24 hour  Intake 0 ml  Output 1250 ml  Net -1250 ml   Filed Weights   05/23/22 2139 05/25/22 0828  Weight: 77.1 kg 77.1 kg   Weight change:  Body mass index is 27.44 kg/m.   Physical Exam: General exam: Pleasant, not in distress Skin: No rashes, lesions or ulcers. HEENT: Atraumatic, normocephalic, no obvious bleeding.  NG tube attached to suction. Lungs: Clear to auscultation bilaterally CVS: Regular rate and rhythm, no murmur GI/Abd soft, mild distention, bowel sound sluggish CNS: Alert,, awake, oriented  x3 Psychiatry: Sad affect Extremities: No pedal edema, no calf tenderness  Data Review: I have personally reviewed the laboratory data and studies available.  F/u labs ordered Unresulted Labs (From admission, onward)     Start     Ordered   05/27/22 0500  CBC with Differential/Platelet  Tomorrow morning,   R        05/26/22 0811   05/27/22 4920  Basic metabolic panel  Tomorrow morning,   R        05/26/22 0811   05/25/22 0000  Prealbumin  Weekly,   R      05/24/22 0730            Signed, Terrilee Croak, MD [facility] 05/26/2022

## 2022-05-26 NOTE — Evaluation (Signed)
Physical Therapy Evaluation Patient Details Name: Mary Stark MRN: [id number] DOB: 03-27-1956 Today's Date: 05/26/2022  History of Present Illness  Patient is 66 y.o. female admitted to [facility] for bowel obstruction 2/2 metastatic bladder cancer presented with encephalopathy. Pt now s/p open right hemicolectomy/partial omentectomy with primary ileocolic anastomosis on 07/08/35. Code Stroke called on 6/11 prior to surgery and exam was consistent with encephalopathy overall, with some mild asymmetry of weakness LLE>RLE which appears to be at or near her baseline asymmetry 2/2 prior stroke, CT of head negative, unabel to obtain MRI. PMH significant of stage IV adenocarcinoma of bladder,anemia related to chemo therapy, anxiety , CKDIII,GERD,HTN, HLD ,ETOH dependence.    Clinical Impression  Mary Stark is 66 y.o. female admitted with above HPI and diagnosis. Patient is currently limited by functional impairments below (see PT problem list). Patient lives with her husband and daughter and grandsons and is independent at baseline. Currently pt requires cues for safe bed mobility 2/2 recent abdominal surgery and min guard for transfers and gait with RW. Patient will benefit from continued skilled PT interventions to address impairments and progress independence with mobility, anticipate if pt continues to mobilize with acute services she will recover well with no follow up PT needs. Acute PT will follow and progress as able.        Recommendations for follow up therapy are one component of a multi-disciplinary discharge planning process, led by the attending physician.  Recommendations may be updated based on patient status, additional functional criteria and insurance authorization.  Follow Up Recommendations No PT follow up    Assistance Recommended at Discharge Intermittent Supervision/Assistance  Patient can return home with the following  A little help with walking and/or transfers;A little help  with bathing/dressing/bathroom;Assistance with cooking/housework    Equipment Recommendations None recommended by PT  Recommendations for Other Services       Functional Status Assessment Patient has had a recent decline in their functional status and demonstrates the ability to make significant improvements in function in a reasonable and predictable amount of time.     Precautions / Restrictions Precautions Precautions: Fall Precaution Comments: NGT, Abd surgery Restrictions Weight Bearing Restrictions: No      Mobility  Bed Mobility Overal bed mobility: Needs Assistance Bed Mobility: Rolling, Sidelying to Sit Rolling: Min guard Sidelying to sit: Min assist       General bed mobility comments: guarding with VCs for log roll technique. pt using bed rail to roll and min assist to press trunk fully upright.    Transfers Overall transfer level: Needs assistance Equipment used: Rolling walker (2 wheels) Transfers: Sit to/from Stand Sit to Stand: Min guard           General transfer comment: cues for hand placement with RW for power up from EOB, guarding for safety. Pt completed repeated sit<>stand for LE strength test bracing abdomen with pillow, guard for safety.    Ambulation/Gait Ambulation/Gait assistance: Min guard Gait Distance (Feet): 220 Feet Assistive device: Rolling walker (2 wheels) Gait Pattern/deviations: Step-through pattern, Decreased stride length, Shuffle Gait velocity: decr     General Gait Details: cues to maintain safe position of RW, guarding for safety, no LOB noted  Stairs            Wheelchair Mobility    Modified Rankin (Stroke Patients Only)       Balance Overall balance assessment: Mild deficits observed, not formally tested  Pertinent Vitals/Pain Pain Assessment Pain Assessment: Faces Faces Pain Scale: Hurts a little bit Pain Location: abdomen Pain  Descriptors / Indicators: Discomfort Pain Intervention(s): Limited activity within patient's tolerance, Monitored during session, Repositioned    Home Living Family/patient expects to be discharged to:: Private residence Living Arrangements: Spouse/significant other;Children Available Help at Discharge: Family Type of Home: House Home Access: Level entry       Home Layout: One level Home Equipment: Air cabin crew (4 wheels)      Prior Function Prior Level of Function : Independent/Modified Independent             Mobility Comments: pt mobilizing without AD, recently retired Therapist, 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)       Journalist, newspaper   Dominant Hand: Right    Extremity/Trunk Assessment   Upper Extremity Assessment Upper Extremity Assessment: Overall WFL for tasks assessed    Lower Extremity Assessment Lower Extremity Assessment: Generalized weakness (5xS<>S: 33 second, no UE use)    Cervical / Trunk Assessment Cervical / Trunk Assessment: Normal  Communication   Communication: No difficulties  Cognition Arousal/Alertness: Awake/alert Behavior During Therapy: WFL for tasks assessed/performed Overall Cognitive Status: Impaired/Different from baseline Area of Impairment: Orientation, Awareness                 Orientation Level: Disoriented to, Place, Situation         Awareness: Emergent   General Comments: pt reports at hospital unsure the name and using written signs in room with cues from therapist to determine [facility] (pt is retired Therapist, sports from [employer]). pt able to state weekday, month, and year. pt requesting we bring "the young one back in" husband states she thinks she is hearing/seeing a young RN from [facility], seems to be a hallucination.        General Comments      Exercises     Assessment/Plan    PT Assessment Patient needs continued PT services  PT Problem List Decreased strength;Decreased activity tolerance;Decreased balance;Decreased  mobility;Decreased knowledge of use of DME;Decreased knowledge of precautions;Decreased cognition       PT Treatment Interventions DME instruction;Gait training;Stair training;Functional mobility training;Therapeutic activities;Therapeutic exercise;Balance training;Patient/family education    PT Goals (Current goals can be found in the Care Plan section)  Acute Rehab PT Goals Patient Stated Goal: get home and recover PT Goal Formulation: With patient/family Time For Goal Achievement: 06/09/22 Potential to Achieve Goals: Good    Frequency Min 3X/week     Co-evaluation               AM-PAC PT "6 Clicks" Mobility  Outcome Measure Help needed turning from your back to your side while in a flat bed without using bedrails?: A Little Help needed moving from lying on your back to sitting on the side of a flat bed without using bedrails?: A Little Help needed moving to and from a bed to a chair (including a wheelchair)?: A Little Help needed standing up from a chair using your arms (e.g., wheelchair or bedside chair)?: A Little Help needed to walk in hospital room?: A Little Help needed climbing 3-5 steps with a railing? : A Little 6 Click Score: 18    End of Session Equipment Utilized During Treatment: Gait belt Activity Tolerance: Patient tolerated treatment well Patient left: in chair;with call bell/phone within reach;with chair alarm set;with family/visitor present Nurse Communication: Mobility status PT Visit Diagnosis: Muscle weakness (generalized) (M62.81);Difficulty in walking, not elsewhere classified (R26.2);Unsteadiness on feet (R26.81)  Time: 4360-6770 PT Time Calculation (min) (ACUTE ONLY): 30 min   Charges:   PT Evaluation $PT Eval Moderate Complexity: 1 Mod PT Treatments $Gait Training: 8-22 mins        Verner Mould, DPT Acute Rehabilitation Services Office [phone] Pager [phone]  05/26/22 12:47 PM

## 2022-05-27 ENCOUNTER — Inpatient Hospital Stay: Payer: No Typology Code available for payment source

## 2022-05-27 ENCOUNTER — Inpatient Hospital Stay: Payer: No Typology Code available for payment source | Admitting: Oncology

## 2022-05-27 LAB — CBC WITH DIFFERENTIAL/PLATELET
Abs Immature Granulocytes: 0.16 10*3/uL — ABNORMAL HIGH (ref 0.00–0.07)
Basophils Absolute: 0 10*3/uL (ref 0.0–0.1)
Basophils Relative: 0 %
Eosinophils Absolute: 0.2 10*3/uL (ref 0.0–0.5)
Eosinophils Relative: 2 %
HCT: 25.5 % — ABNORMAL LOW (ref 36.0–46.0)
Hemoglobin: 7.9 g/dL — ABNORMAL LOW (ref 12.0–15.0)
Immature Granulocytes: 2 %
Lymphocytes Relative: 12 %
Lymphs Abs: 1.1 10*3/uL (ref 0.7–4.0)
MCH: 35.1 pg — ABNORMAL HIGH (ref 26.0–34.0)
MCHC: 31 g/dL (ref 30.0–36.0)
MCV: 113.3 fL — ABNORMAL HIGH (ref 80.0–100.0)
Monocytes Absolute: 1.2 10*3/uL — ABNORMAL HIGH (ref 0.1–1.0)
Monocytes Relative: 14 %
Neutro Abs: 6.3 10*3/uL (ref 1.7–7.7)
Neutrophils Relative %: 70 %
Platelets: 202 10*3/uL (ref 150–400)
RBC: 2.25 MIL/uL — ABNORMAL LOW (ref 3.87–5.11)
RDW: 15.9 % — ABNORMAL HIGH (ref 11.5–15.5)
WBC: 9 10*3/uL (ref 4.0–10.5)
nRBC: 0 % (ref 0.0–0.2)

## 2022-05-27 LAB — BASIC METABOLIC PANEL
Anion gap: 7 (ref 5–15)
BUN: 12 mg/dL (ref 8–23)
CO2: 24 mmol/L (ref 22–32)
Calcium: 8 mg/dL — ABNORMAL LOW (ref 8.9–10.3)
Chloride: 108 mmol/L (ref 98–111)
Creatinine, Ser: 0.92 mg/dL (ref 0.44–1.00)
GFR, Estimated: >60 mL/min (ref >60)
Glucose, Bld: 79 mg/dL (ref 70–99)
Potassium: 3.7 mmol/L (ref 3.5–5.1)
Sodium: 139 mmol/L (ref 135–145)

## 2022-05-27 MED ORDER — GABAPENTIN 300 MG PO CAPS
300.0000 mg | ORAL_CAPSULE | Freq: Every day | ORAL | Status: DC
  Administered 2022-05-28 – 2022-05-30 (×3): 300 mg via ORAL
  Filled 2022-05-27 (×3): qty 1

## 2022-05-27 MED ORDER — SIMVASTATIN 20 MG PO TABS
20.0000 mg | ORAL_TABLET | Freq: Every day | ORAL | Status: DC
  Administered 2022-05-27 – 2022-05-29 (×3): 20 mg via ORAL
  Filled 2022-05-27 (×3): qty 1

## 2022-05-27 MED ORDER — BUPROPION HCL ER (XL) 300 MG PO TB24
300.0000 mg | ORAL_TABLET | Freq: Every day | ORAL | Status: DC
  Administered 2022-05-28 – 2022-05-30 (×3): 300 mg via ORAL
  Filled 2022-05-27 (×3): qty 1

## 2022-05-27 MED ORDER — HYDROCODONE-ACETAMINOPHEN 5-325 MG PO TABS: 1.0000 | ORAL_TABLET | Freq: Once | ORAL | Status: DC | PRN

## 2022-05-27 MED ORDER — MIRABEGRON ER 25 MG PO TB24
25.0000 mg | ORAL_TABLET | Freq: Every day | ORAL | Status: DC
  Administered 2022-05-27 – 2022-05-30 (×4): 25 mg via ORAL
  Filled 2022-05-27 (×4): qty 1

## 2022-05-27 MED ORDER — ARIPIPRAZOLE 10 MG PO TABS
10.0000 mg | ORAL_TABLET | Freq: Every day | ORAL | Status: DC
  Administered 2022-05-27 – 2022-05-29 (×3): 10 mg via ORAL
  Filled 2022-05-27 (×3): qty 1

## 2022-05-27 MED ORDER — GABAPENTIN 300 MG PO CAPS
1200.0000 mg | ORAL_CAPSULE | Freq: Every day | ORAL | Status: DC
  Administered 2022-05-27 – 2022-05-29 (×3): 1200 mg via ORAL
  Filled 2022-05-27 (×3): qty 4

## 2022-05-27 MED ORDER — TAMSULOSIN HCL 0.4 MG PO CAPS
0.4000 mg | ORAL_CAPSULE | Freq: Every day | ORAL | Status: DC
  Administered 2022-05-27 – 2022-05-29 (×3): 0.4 mg via ORAL
  Filled 2022-05-27 (×3): qty 1

## 2022-05-27 MED ORDER — ACETAMINOPHEN 325 MG PO TABS: 650.0000 mg | ORAL_TABLET | ORAL | Status: DC | PRN

## 2022-05-27 MED ORDER — OXYMETAZOLINE HCL 0.05 % NA SOLN: 1.0000 | Freq: Two times a day (BID) | NASAL | Status: DC | PRN

## 2022-05-27 MED ORDER — SERTRALINE HCL 25 MG PO TABS
25.0000 mg | ORAL_TABLET | Freq: Every day | ORAL | Status: DC
  Administered 2022-05-27 – 2022-05-30 (×4): 25 mg via ORAL
  Filled 2022-05-27 (×4): qty 1

## 2022-05-27 MED ORDER — KETOROLAC TROMETHAMINE 30 MG/ML IJ SOLN
30.0000 mg | Freq: Four times a day (QID) | INTRAMUSCULAR | Status: DC
  Administered 2022-05-27 – 2022-05-30 (×13): 30 mg via INTRAVENOUS
  Filled 2022-05-27 (×13): qty 1

## 2022-05-27 NOTE — Progress Notes (Signed)
2 Days Post-Op   Subjective/Chief Complaint: Patient more alert this morning NG in place and functioning - 900 cc output last 24 Patient reports flatus, no BM Pain moderately controlled - abdominal binder in place Working well with PT - no recommendations for post-discharge needs; minimal assistance needed   Objective: Vital signs in last 24 hours: Temp:  [98.1 F (36.7 C)-98.4 F (36.9 C)] 98.4 F (36.9 C) (06/14 0433) Pulse Rate:  [83-85] 85 (06/14 0433) Resp:  [18] 18 (06/14 0433) BP: (155-167)/(84-91) 167/91 (06/14 0433) SpO2:  [96 %-97 %] 96 % (06/14 0433) Last BM Date : 05/23/22  Intake/Output from previous day: 06/13 0701 - 06/14 0700 In: 1400 [I.V.:1200; IV Piggyback:200] Out: 1550 [Urine:650; Emesis/NG output:900] Intake/Output this shift: No intake/output data recorded.  General appearance: alert, cooperative, and no distress GI: soft, minimal distention; incisional tenderness Incision/Wound: honeycomb dressing with minimal staining  Lab Results:  Recent Labs    05/26/22 1114 05/27/22 0545  WBC 7.7 9.0  HGB 8.1* 7.9*  HCT 25.6* 25.5*  PLT 216 202   BMET Recent Labs    05/26/22 1114 05/27/22 0545  NA 136 139  K 3.9 3.7  CL 104 108  CO2 25 24  GLUCOSE 106* 79  BUN 12 12  CREATININE 1.05* 0.92  CALCIUM 7.6* 8.0*   PT/INR No results for input(s): "LABPROT", "INR" in the last 72 hours. ABG No results for input(s): "PHART", "HCO3" in the last 72 hours.  Invalid input(s): "PCO2", "PO2"  Studies/Results: DG Abd 1 View  Result Date: 05/25/2022 CLINICAL DATA:  Concern for retained foreign body EXAM: ABDOMEN - 1 VIEW COMPARISON:  None Available. FINDINGS: Nonobstructive pattern of bowel gas with gas present to the rectum. Status post midline laparotomy. Bilateral double-J ureteral stents with formed pigtails in the expected vicinity of the renal pelves and bladder. No unexpected radiopaque foreign body. No obvious free air in the abdomen. IMPRESSION:  No unexpected radiopaque foreign body matching provided description of 1 inch x 1 inch metal square. Findings reported by telephone to operating room to [facility] RN, 12:01 p.m., 05/25/2022 Electronically Signed   By: Delanna Ahmadi M.D.   On: 05/25/2022 12:05    Anti-infectives: Anti-infectives (From admission, onward)    Start     Dose/Rate Route Frequency Ordered Stop   05/25/22 0600  cefoTEtan (CEFOTAN) 2 g in sodium chloride 0.9 % 100 mL IVPB        2 g 200 mL/hr over 30 Minutes Intravenous On call to O.R. 05/24/22 1456 05/25/22 1030   05/24/22 2200  neomycin (MYCIFRADIN) tablet 1,000 mg  Status:  Discontinued       See Hyperspace for full Linked Orders Report.   1,000 mg Oral 3 times per day 05/24/22 1456 05/24/22 1519   05/24/22 2200  metroNIDAZOLE (FLAGYL) tablet 1,000 mg  Status:  Discontinued       See Hyperspace for full Linked Orders Report.   1,000 mg Oral 3 times per day 05/24/22 1456 05/24/22 1519   05/24/22 1700  metroNIDAZOLE (FLAGYL) tablet 1,000 mg  Status:  Discontinued       See Hyperspace for full Linked Orders Report.   1,000 mg Oral 3 times per day on Sun 05/24/22 1519 05/25/22 1327   05/24/22 1700  neomycin (MYCIFRADIN) tablet 1,000 mg  Status:  Discontinued       See Hyperspace for full Linked Orders Report.   1,000 mg Oral 3 times per day on Sun 05/24/22 1519 05/25/22 1327  05/24/22 0500  cefTRIAXone (ROCEPHIN) 1 g in sodium chloride 0.9 % 100 mL IVPB        1 g 200 mL/hr over 30 Minutes Intravenous Every 24 hours 05/24/22 0406         Assessment/Plan: Stage IV adenocarcinoma of the bladder Colon mass - metastatic bladder cancer causing obstruction S/p open right hemicolectomy/ partial omentectomy with primary ileocolic anastomosis 4/49/67 - Dr. Georgette Dover     Pain control - add Toradol since renal function is normal Continue Dilaudid IV Abdominal binder Awaiting return of bowel function - clamp NG tube; start clear liquids D/C Foley - 6/13 PT/ OT -  encourage ambulation and mobilization    LOS: 3 days    Mary Stark 05/27/2022

## 2022-05-27 NOTE — Hospital Course (Addendum)
38yof w/ colon obstruction due to metastatic disease of bladder came in with left side abdominal pain x few days. She Underwent right hemicolectomy  and awaiting return of bowel function.  Subsequently patient had return of her bowel function Selmon tolerating diet noted to have downtrending hemoglobin transfuse 1 unit PRBC 6/16 with appropriate improvement in her hemoglobin.  At this time she is medically stable.  She has been cleared for discharge by surgical team.  She is ambulating well no further PT OT Patient is being discharged home in medically stable condition

## 2022-05-27 NOTE — Evaluation (Signed)
Occupational Therapy Evaluation Patient Details Name: Mary Stark MRN: [id number] DOB: 20-May-1956 Today's Date: 05/27/2022   History of Present Illness Patient is 66 y.o. female admitted to [facility] for bowel obstruction 2/2 metastatic bladder cancer presented with encephalopathy. Pt now s/p open right hemicolectomy/partial omentectomy with primary ileocolic anastomosis on 5/36/64. Code Stroke called on 6/11 prior to surgery and exam was consistent with encephalopathy overall, with some mild asymmetry of weakness LLE>RLE which appears to be at or near her baseline asymmetry 2/2 prior stroke, CT of head negative, unabel to obtain MRI. PMH significant of stage IV adenocarcinoma of bladder,anemia related to chemo therapy, anxiety , CKDIII,GERD,HTN, HLD ,ETOH dependence.   Clinical Impression   Pt admitted with the above diagnoses and presents with below problem list. Pt will benefit from continued acute OT to address the below listed deficits and maximize independence with basic ADLs prior to d/c home with family. At baseline, mod I to Independent with basic ADLs. Pt currently needs set up to min guard assist with UB/LB ADLs and functional mobility/transfers.Able to walk distance of hallway with fairly good toleration, some increased abdominal pain.         Recommendations for follow up therapy are one component of a multi-disciplinary discharge planning process, led by the attending physician.  Recommendations may be updated based on patient status, additional functional criteria and insurance authorization.   Follow Up Recommendations  Home health OT    Assistance Recommended at Discharge Intermittent Supervision/Assistance  Patient can return home with the following A little help with walking and/or transfers;A little help with bathing/dressing/bathroom;Direct supervision/assist for medications management;Assistance with cooking/housework    Functional Status Assessment  Patient has had a  recent decline in their functional status and demonstrates the ability to make significant improvements in function in a reasonable and predictable amount of time.  Equipment Recommendations  None recommended by OT    Recommendations for Other Services       Precautions / Restrictions Precautions Precautions: Fall Precaution Comments: NGT, Abd surgery Restrictions Weight Bearing Restrictions: No      Mobility Bed Mobility Overal bed mobility: Needs Assistance Bed Mobility: Rolling, Sidelying to Sit, Sit to Supine Rolling: Min guard Sidelying to sit: Min guard   Sit to supine: Min guard   General bed mobility comments: extra time and effort    Transfers Overall transfer level: Needs assistance Equipment used: Rolling walker (2 wheels) Transfers: Sit to/from Stand Sit to Stand: Min guard           General transfer comment: to/from EOB. utilized rw for balance.      Balance Overall balance assessment: Mild deficits observed, not formally tested                                         ADL either performed or assessed with clinical judgement   ADL Overall ADL's : Needs assistance/impaired     Grooming: Set up;Sitting   Upper Body Bathing: Set up;Sitting;Supervision/ safety   Lower Body Bathing: Min guard;Sit to/from stand   Upper Body Dressing : Supervision/safety;Set up;Sitting   Lower Body Dressing: Min guard;Sit to/from stand   Toilet Transfer: Min guard;Ambulation;Rolling walker (2 wheels)   Toileting- Clothing Manipulation and Hygiene: Min guard       Functional mobility during ADLs: Min guard;Rolling walker (2 wheels) General ADL Comments: Pt walked household distance this session.  Vision         Perception     Praxis      Pertinent Vitals/Pain Pain Assessment Pain Assessment: Faces Faces Pain Scale: Hurts little more Pain Location: abdomen Pain Descriptors / Indicators: Discomfort Pain Intervention(s):  Monitored during session, Repositioned, Limited activity within patient's tolerance     Hand Dominance Right   Extremity/Trunk Assessment Upper Extremity Assessment Upper Extremity Assessment: Overall WFL for tasks assessed   Lower Extremity Assessment Lower Extremity Assessment: Defer to PT evaluation   Cervical / Trunk Assessment Cervical / Trunk Assessment: Normal   Communication Communication Communication: No difficulties   Cognition Arousal/Alertness: Awake/alert Behavior During Therapy: Flat affect, WFL for tasks assessed/performed Overall Cognitive Status: Impaired/Different from baseline Area of Impairment: Orientation, Awareness, Memory, Safety/judgement, Problem solving                 Orientation Level: Situation   Memory: Decreased recall of precautions   Safety/Judgement: Decreased awareness of deficits Awareness: Emergent Problem Solving: Slow processing General Comments: seems internally distracted. minimal verbal responses.     General Comments       Exercises     Shoulder Instructions      Home Living Family/patient expects to be discharged to:: Private residence Living Arrangements: Spouse/significant other;Children Available Help at Discharge: Family Type of Home: House Home Access: Level entry     Home Layout: One level     Bathroom Shower/Tub: Walk-in shower;Tub only   Bathroom Toilet: Handicapped height Bathroom Accessibility: Yes   Home Equipment: Air cabin crew (4 wheels)          Prior Functioning/Environment Prior Level of Function : Independent/Modified Independent             Mobility Comments: pt mobilizing without AD, recently retired Therapist, 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)          OT Problem List: Decreased strength;Decreased activity tolerance;Impaired balance (sitting and/or standing);Decreased knowledge of use of DME or AE;Decreased knowledge of precautions;Decreased safety awareness;Pain      OT  Treatment/Interventions: Self-care/ADL training;Therapeutic exercise;Energy conservation;DME and/or AE instruction;Balance training;Patient/family education;Therapeutic activities    OT Goals(Current goals can be found in the care plan section) Acute Rehab OT Goals Patient Stated Goal: not stated OT Goal Formulation: With patient Time For Goal Achievement: 06/10/22 Potential to Achieve Goals: Good  OT Frequency: Min 2X/week    Co-evaluation              AM-PAC OT "6 Clicks" Daily Activity     Outcome Measure Help from another person eating meals?: None Help from another person taking care of personal grooming?: None Help from another person toileting, which includes using toliet, bedpan, or urinal?: A Little Help from another person bathing (including washing, rinsing, drying)?: A Little Help from another person to put on and taking off regular upper body clothing?: None Help from another person to put on and taking off regular lower body clothing?: A Little 6 Click Score: 21   End of Session Equipment Utilized During Treatment: Rolling walker (2 wheels)  Activity Tolerance: Patient tolerated treatment well Patient left: in bed;with call bell/phone within reach;with bed alarm set  OT Visit Diagnosis: Unsteadiness on feet (R26.81);Muscle weakness (generalized) (M62.81);Pain                Time: 0865-7846 OT Time Calculation (min): 20 min Charges:  OT General Charges $OT Visit: 1 Visit OT Evaluation $OT Eval Low Complexity: [place], OT Acute Rehabilitation Services Office:  [phone]   Hortencia Pilar 05/27/2022, 12:58 PM

## 2022-05-27 NOTE — Progress Notes (Signed)
PROGRESS NOTE Mary Stark  DEY:[id number] DOB: 01-10-1956 DOA: 05/23/2022 PCP: [name], NP   Brief Narrative/Hospital Course: [phone] w/ colon obstruction due to metastatic disease of bladder came in with left side abdominal pain x few days. She Underwent right hemicolectomy  and awaiting return of bowel function.     Subjective:  Seen and examined this morning resting comfortably in the bedside chair.  Overnight on room air BP 150s to 160s afebrile, labs with improved electrolyte, hemoglobin downtrending 7.9 g NG tube clamped passing flatus but no BM yet  Assessment and Plan: Principal Problem:   Colonic obstruction from bladder cancer metstasis to ascending colon Active Problems:   Heavy alcohol consumption   AKI (acute kidney injury) (HCC)   Metastasis from bladder cancer (HCC)   CKD (chronic kidney disease) stage 3, GFR 30-59 ml/min (HCC)  Colon mass with metastatic bladder cancer causing obstructions/p open right hemicolectomy/partial omentectomy with primary ileocolic anastomosis 0/26 Postop ileus: Postop doing well awaiting on return of bowel function continue IV Dilaudid, abdominal binder, NG tube clamped, started on clear liquid diet.  Appreciate surgical input.  Stage IV adenocarcinoma of the bladder: On FOLFOX q. 14 days complicated with urinary obstruction S/P urinary changes due to bilateral hydronephrosis on prophylactic antibiotic due to stents for prevention of UTI-transition to Keflex p.o. from tomorrow. needs follow-up with urology and oncology upon discharge.   Microcytic anemia H&H stable slightly downtrending.  Monitoring transfuse if less than 7 g Recent Labs  Lab 05/23/22 2250 05/24/22 0659 05/25/22 0514 05/26/22 1114 05/27/22 0545  HGB 9.8* 9.5* 8.8* 8.1* 7.9*  HCT 30.7* 29.1* 27.7* 25.6* 25.5*    CKDIIIa: Baseline creatinine around 1.4 stable.  Avoid nephrotoxic medication encourage hydration Recent Labs  Lab 05/23/22 2250 05/24/22 0659  05/25/22 0514 05/26/22 1114 05/27/22 0545  BUN '17 18 11 12 12  '$ CREATININE 1.41* 1.18*  1.32* 1.18* 1.05* 0.92   Hyponatremia mild -resolved Anxiety continue Ativan as needed GERD continue PPI   DVT prophylaxis: heparin injection 5,000 Units Start: 05/26/22 0600 Code Status:   Code Status: Full Code Family Communication: plan of care discussed with patient at bedside. Patient status is: Inpatient because of ongoing management of postop course awaiting return of bowel function Level of care: Telemetry   Dispo: The patient is from: home            Anticipated disposition: home once bowel function returns and cleared by surgery  Mobility Assessment (last 72 hours)     Mobility Assessment     Row Name 05/27/22 1200 05/27/22 0827 05/26/22 2200 05/26/22 1129 05/25/22 2000   Does patient have an order for bedrest or is patient medically unstable -- No - Continue assessment No - Continue assessment -- No - Continue assessment   What is the highest level of mobility based on the progressive mobility assessment? Level 5 (Walks with assist in room/hall) - Balance while stepping forward/back and can walk in room with assist - Complete Level 5 (Walks with assist in room/hall) - Balance while stepping forward/back and can walk in room with assist - Complete Level 5 (Walks with assist in room/hall) - Balance while stepping forward/back and can walk in room with assist - Complete Level 5 (Walks with assist in room/hall) - Balance while stepping forward/back and can walk in room with assist - Complete --    Row Name 05/24/22 2015           Does patient have an order for bedrest or  is patient medically unstable No - Continue assessment                 Objective: Vitals last 24 hrs: Vitals:   05/26/22 0548 05/26/22 1950 05/27/22 0433 05/27/22 1320  BP: (!) 169/85 (!) 155/84 (!) 167/91 (!) 172/87  Pulse: (!) 101 83 85 75  Resp: '18 18 18 14  '$ Temp: 98.7 F (37.1 C) 98.1 F (36.7 C) 98.4  F (36.9 C) 97.7 F (36.5 C)  TempSrc: Oral Oral Oral Oral  SpO2: 100% 97% 96% 96%  Weight:      Height:       Weight change:   Physical Examination: General exam: alert awake,older than stated age, weak appearing. NGT+ HEENT:Oral mucosa moist, Ear/Nose WNL grossly, dentition normal. Respiratory system: bilaterally diminished BS, no use of accessory muscle Cardiovascular system: S1 & S2 +, No JVD. Gastrointestinal system: Abdomen soft,NT,ND, BS+ Nervous System:Alert, awake, moving extremities and grossly nonfocal Extremities: LE edema neg,distal peripheral pulses palpable.  Skin: No rashes,no icterus. MSK: Normal muscle bulk,tone, power PORTA CATH+  Medications reviewed:  Scheduled Meds:  Chlorhexidine Gluconate Cloth  6 each Topical Daily   cycloSPORINE  1 drop Both Eyes Daily   heparin injection (subcutaneous)  5,000 Units Subcutaneous Q8H   ketorolac  30 mg Intravenous Q6H   pantoprazole (PROTONIX) IV  40 mg Intravenous Q24H   Continuous Infusions:  sodium chloride 100 mL/hr at 05/27/22 1023   cefTRIAXone (ROCEPHIN)  IV Stopped (05/27/22 3299)      Diet Order             Diet clear liquid Room service appropriate? Yes; Fluid consistency: Thin  Diet effective now                  Intake/Output Summary (Last 24 hours) at 05/27/2022 1346 Last data filed at 05/27/2022 0900 Gross per 24 hour  Intake 1980 ml  Output 1150 ml  Net 830 ml   Net IO Since Admission: 3,077.89 mL [05/27/22 1346]  Wt Readings from Last 3 Encounters:  05/25/22 77.1 kg  05/22/22 78.7 kg  05/19/22 76.8 kg     Unresulted Labs (From admission, onward)     Start     Ordered   05/25/22 0000  Prealbumin  Weekly,   R (with TIMED occurrences)      05/24/22 0730          Data Reviewed: I have personally reviewed following labs and imaging studies CBC: Recent Labs  Lab 05/22/22 1255 05/23/22 2250 05/24/22 0659 05/25/22 0514 05/26/22 1114 05/27/22 0545  WBC 5.7 6.4 6.0 6.8 7.7  9.0  NEUTROABS 3.5 4.6 3.9  --   --  6.3  HGB 8.6* 9.8* 9.5* 8.8* 8.1* 7.9*  HCT 26.7* 30.7* 29.1* 27.7* 25.6* 25.5*  MCV 111.3* 109.3* 109.4* 112.1* 113.3* 113.3*  PLT 158 228 214 204 216 242   Basic Metabolic Panel: Recent Labs  Lab 05/23/22 2250 05/24/22 0659 05/25/22 0514 05/26/22 1114 05/27/22 0545  NA 134* 134* 138 136 139  K 4.1 3.9 3.7 3.9 3.7  CL 94* 92* 105 104 108  CO2 '28 30 26 25 24  '$ GLUCOSE 135* 121* 120* 106* 79  BUN '17 18 11 12 12  '$ CREATININE 1.41* 1.18*  1.32* 1.18* 1.05* 0.92  CALCIUM 8.7* 8.7* 8.3* 7.6* 8.0*  MG  --   --  2.3 1.8  --   PHOS  --   --  2.5 3.3  --    GFR:  Estimated Creatinine Clearance: 64.8 mL/min (by C-G formula based on SCr of 0.92 mg/dL). Liver Function Tests: Recent Labs  Lab 05/22/22 1255 05/23/22 2250 05/24/22 0659  AST 10* 17 16  ALT '5 9 10  '$ ALKPHOS 77 80 80  BILITOT 0.4 0.6 0.7  PROT 6.3* 6.9 6.7  ALBUMIN 3.5 3.4* 3.4*   Recent Labs  Lab 05/23/22 2250  LIPASE 19   No results for input(s): "AMMONIA" in the last 168 hours. Coagulation Profile: No results for input(s): "INR", "PROTIME" in the last 168 hours. BNP (last 3 results) No results for input(s): "PROBNP" in the last 8760 hours. HbA1C: No results for input(s): "HGBA1C" in the last 72 hours. CBG: Recent Labs  Lab 05/24/22 2122  GLUCAP 117*   Lipid Profile: No results for input(s): "CHOL", "HDL", "LDLCALC", "TRIG", "CHOLHDL", "LDLDIRECT" in the last 72 hours. Thyroid Function Tests: No results for input(s): "TSH", "T4TOTAL", "FREET4", "T3FREE", "THYROIDAB" in the last 72 hours. Sepsis Labs: No results for input(s): "PROCALCITON", "LATICACIDVEN" in the last 168 hours.  Recent Results (from the past 240 hour(s))  Urine Culture     Status: Abnormal   Collection Time: 05/24/22  1:24 AM   Specimen: Urine, Clean Catch  Result Value Ref Range Status   Specimen Description   Final    URINE, CLEAN CATCH Performed at [facility], [place]  [address] [place], [place] [address]    Special Requests   Final    NONE Performed at [facility], [place] [address] [place], [place] [address]    Culture (A)  Final    <10,000 COLONIES/mL INSIGNIFICANT GROWTH Performed at [facility] [address] [place], [place] [address]    Report Status 05/25/2022 FINAL  Final    Antimicrobials: Anti-infectives (From admission, onward)    Start     Dose/Rate Route Frequency Ordered Stop   05/25/22 0600  cefoTEtan (CEFOTAN) 2 g in sodium chloride 0.9 % 100 mL IVPB        2 g 200 mL/hr over 30 Minutes Intravenous On call to O.R. 05/24/22 1456 05/25/22 1030   05/24/22 2200  neomycin (MYCIFRADIN) tablet 1,000 mg  Status:  Discontinued       See Hyperspace for full Linked Orders Report.   1,000 mg Oral 3 times per day 05/24/22 1456 05/24/22 1519   05/24/22 2200  metroNIDAZOLE (FLAGYL) tablet 1,000 mg  Status:  Discontinued       See Hyperspace for full Linked Orders Report.   1,000 mg Oral 3 times per day 05/24/22 1456 05/24/22 1519   05/24/22 1700  metroNIDAZOLE (FLAGYL) tablet 1,000 mg  Status:  Discontinued       See Hyperspace for full Linked Orders Report.   1,000 mg Oral 3 times per day on Sun 05/24/22 1519 05/25/22 1327   05/24/22 1700  neomycin (MYCIFRADIN) tablet 1,000 mg  Status:  Discontinued       See Hyperspace for full Linked Orders Report.   1,000 mg Oral 3 times per day on Sun 05/24/22 1519 05/25/22 1327   05/24/22 0500  cefTRIAXone (ROCEPHIN) 1 g in sodium chloride 0.9 % 100 mL IVPB        1 g 200 mL/hr over 30 Minutes Intravenous Every 24 hours 05/24/22 0406        Culture/Microbiology    Component Value Date/Time   SDES  05/24/2022 0124    URINE, CLEAN CATCH Performed at [facility], [place] [address]  [place], [place] [address]    [place]  05/24/2022 0124    NONE Performed at [facility], [place] [address], [place], [place] [address]    CULT (A)  05/24/2022 0124    <10,000 COLONIES/mL INSIGNIFICANT GROWTH Performed at [facility] [address] [place], [place] [address]    REPTSTATUS 05/25/2022 FINAL 05/24/2022 0124    Other culture-see note  Radiology Studies: No results found.   LOS: 3 days   Antonieta Pert, MD [facility]  05/27/2022, 1:46 PM

## 2022-05-28 LAB — CBC
HCT: 25.4 % — ABNORMAL LOW (ref 36.0–46.0)
Hemoglobin: 7.9 g/dL — ABNORMAL LOW (ref 12.0–15.0)
MCH: 35.1 pg — ABNORMAL HIGH (ref 26.0–34.0)
MCHC: 31.1 g/dL (ref 30.0–36.0)
MCV: 112.9 fL — ABNORMAL HIGH (ref 80.0–100.0)
Platelets: 205 10*3/uL (ref 150–400)
RBC: 2.25 MIL/uL — ABNORMAL LOW (ref 3.87–5.11)
RDW: 15.6 % — ABNORMAL HIGH (ref 11.5–15.5)
WBC: 9.8 10*3/uL (ref 4.0–10.5)
nRBC: 0 % (ref 0.0–0.2)

## 2022-05-28 LAB — SURGICAL PATHOLOGY

## 2022-05-28 MED ORDER — PANTOPRAZOLE SODIUM 40 MG PO TBEC
40.0000 mg | DELAYED_RELEASE_TABLET | Freq: Every day | ORAL | Status: DC
  Administered 2022-05-29 – 2022-05-30 (×2): 40 mg via ORAL
  Filled 2022-05-28 (×2): qty 1

## 2022-05-28 MED ORDER — METHOCARBAMOL 500 MG PO TABS
500.0000 mg | ORAL_TABLET | Freq: Four times a day (QID) | ORAL | Status: DC | PRN
  Administered 2022-05-28: 500 mg via ORAL
  Filled 2022-05-28: qty 1

## 2022-05-28 MED ORDER — HYDROCODONE-ACETAMINOPHEN 5-325 MG PO TABS
1.0000 | ORAL_TABLET | ORAL | Status: DC | PRN
  Administered 2022-05-28 – 2022-05-30 (×7): 2 via ORAL
  Administered 2022-05-30: 1 via ORAL
  Filled 2022-05-28 (×2): qty 2
  Filled 2022-05-28: qty 1
  Filled 2022-05-28 (×5): qty 2

## 2022-05-28 MED ORDER — CEPHALEXIN 250 MG PO CAPS
250.0000 mg | ORAL_CAPSULE | Freq: Every day | ORAL | Status: DC
  Administered 2022-05-28 – 2022-05-29 (×2): 250 mg via ORAL
  Filled 2022-05-28 (×2): qty 1

## 2022-05-28 NOTE — TOC Initial Note (Signed)
Transition of Care [facility]) - Initial/Assessment Note    Patient Details  Name: Mary Stark MRN: [id number] Date of Birth: September 14, 1956  [facility]) CM/SW Contact:    Lynnell Catalan, RN Phone Number: 05/28/2022, 3:53 PM  Clinical Narrative:                   Expected Discharge Plan: Home/Self Care Barriers to Discharge: Continued Medical Work up    Expected Discharge Plan and Services Expected Discharge Plan: Home/Self Care   Discharge Planning Services: CM Consult   Living arrangements for the past 2 months: Single Family Home                   Prior Living Arrangements/Services Living arrangements for the past 2 months: Single Family Home Lives with:: Spouse Patient language and need for interpreter reviewed:: Yes        Need for Family Participation in Patient Care: No (Comment) Care giver support system in place?: Yes (comment)   Criminal Activity/Legal Involvement Pertinent to Current Situation/Hospitalization: No - Comment as needed  Activities of Daily Living Home Assistive Devices/Equipment: Walker (specify type) ADL Screening (condition at time of admission) Patient's cognitive ability adequate to safely complete daily activities?: Yes Is the patient deaf or have difficulty hearing?: No Does the patient have difficulty seeing, even when wearing glasses/contacts?: No Does the patient have difficulty concentrating, remembering, or making decisions?: No Patient able to express need for assistance with ADLs?: Yes Does the patient have difficulty dressing or bathing?: No Independently performs ADLs?: Yes (appropriate for developmental age) Does the patient have difficulty walking or climbing stairs?: No Weakness of Legs: None Weakness of Arms/Hands: None   Alcohol / Substance Use: Not Applicable Psych Involvement: No (comment)  Admission diagnosis:  SBO (small bowel obstruction) ([place]) [K56.609] Patient Active Problem List   Diagnosis Date Noted    Colonic obstruction from bladder cancer metstasis to ascending colon 05/24/2022   Metastasis from bladder cancer ([place]) 05/24/2022   CKD (chronic kidney disease) stage 3, GFR 30-59 ml/min ([place]) 05/24/2022   Metastasis to lymph nodes ([place]) 01/28/2022   Chronic pain of right knee 09/15/2021   Encounter for antineoplastic chemotherapy 07/09/2021   UTI (urinary tract infection) 05/23/2021   Right lower quadrant pain 01/01/2021   Incontinence of feces 01/01/2021   Abnormal findings on diagnostic imaging of other abdominal regions, including retroperitoneum 01/01/2021   Change in bowel habit 01/01/2021   Hemorrhoids without complication 88/41/6606   Impingement syndrome, shoulder, left 11/27/2020   Port-A-Cath in place 11/22/2020   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/06/2020   Malignancy ([place]) 10/31/2020   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   Sepsis secondary to UTI ([place]) 10/22/2020   Normocytic anemia 10/22/2020   Hyperproteinemia 10/22/2020   Hydronephrosis 10/01/2020   Proteinuria 10/01/2020   Abnormal CT of the abdomen 10/01/2020   Bilateral lower extremity edema 09/12/2020   Acute hypoxemic respiratory failure due to COVID-19 ([place]) 08/19/2020   Tear of medial meniscus of left knee, current 07/13/2019   Internal derangement of knee involving posterior horn of lateral meniscus, left 07/13/2019   Primary osteoarthritis of both knees 07/13/2019   Renal insufficiency 07/13/2019   Anxiety about health 03/22/2019   Hypokalemia 03/21/2019   Essential hypertension with goal blood pressure less than 130/80 02/13/2019   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/2018   Anemia due to blood loss, acute 04/14/2018   Hypomagnesemia 04/14/2018   Hypocalcemia 04/14/2018   Tobacco abuse 04/08/2018   Depression 04/07/2018  AKI (acute kidney injury) ([place]) 04/07/2018   Sepsis ([place]) 04/07/2018   Acute colitis 04/07/2018   Avascular necrosis of humeral head, right ([place]) 03/30/2018   NSAID long-term use 03/30/2018   Goals  of care, counseling/discussion 03/30/2018   Seborrheic keratoses 03/15/2018   History of nonmelanoma skin cancer 03/02/2018   Skin lesion of chest wall 03/02/2018   Alcohol dependence with unspecified alcohol-induced disorder ([place]) 02/11/2018   Severe episode of recurrent major depressive disorder, without psychotic features ([place]) 02/11/2018   Transaminitis 01/06/2018   Nicotine dependence 01/06/2018   Heavy alcohol consumption 01/06/2018   Encounter for monitoring statin therapy 01/06/2018   Class 1 obesity due to excess calories with serious comorbidity in adult 01/06/2018   Chronic pain syndrome 04/21/2017   Chronic right shoulder pain 07/03/2015   Vaginismus 04/12/2014   Menopausal state 11/08/2013   Family history of ovarian cancer 09/27/2013   Postmenopausal atrophic vaginitis 09/12/2013   Personal history of colonic polyps 07/18/2013   Dyslipidemia, goal LDL below 100 07/17/2013   Depression with anxiety 07/17/2013   Hx of hepatitis C 07/17/2013   Vitamin D deficiency 07/17/2013   Right lumbar radiculopathy 07/17/2013   History of alcohol dependence ([place]) 07/17/2013   H/O: rheumatic fever 06/30/2012   Insomnia 06/30/2012   Personal history of other infectious and parasitic diseases 06/30/2012   PCP:  [name], NP Pharmacy:   [place] [address] Phone: [phone] Fax: [phone]  [place], [place] - [address] [place] [address] Phone: [phone] Fax: [phone]     Social Determinants of Health (SDOH) Interventions    Readmission Risk Interventions    05/28/2022    3:41 PM  Readmission Risk Prevention Plan  Transportation Screening Complete  PCP or Specialist Appt within 3-5 Days Complete  HRI or [place] Complete  Social Work Consult for [place] Planning/Counseling  Complete  Palliative Care Screening Not Applicable  Medication Review Press photographer) Complete

## 2022-05-28 NOTE — Progress Notes (Signed)
Progress Note  3 Days Post-Op  Subjective: Pt tolerated FLD overnight with NGT clamped. Had some bowel movements. Reports she is having some incisional pain, IV pain medication works but wears off quickly. Mobilizing.   Objective: Vital signs in last 24 hours: Temp:  [97.7 F (36.5 C)-99.2 F (37.3 C)] 99.2 F (37.3 C) (06/15 0534) Pulse Rate:  [75-94] 94 (06/15 0534) Resp:  [14-16] 16 (06/15 0534) BP: (158-172)/(81-93) 158/93 (06/15 0534) SpO2:  [96 %-98 %] 96 % (06/15 0534) Weight:  [79.7 kg] 79.7 kg (06/15 0534) Last BM Date : 05/27/22  Intake/Output from previous day: 06/14 0701 - 06/15 0700 In: 2507.7 [P.O.:580; I.V.:1927.7] Out: -  Intake/Output this shift: No intake/output data recorded.  PE: General: pleasant, WD female who is laying in bed in NAD Heart: regular, rate, and rhythm.   Lungs: Respiratory effort nonlabored Abd: soft, appropriately ttp, NGT removed, incision with honeycomb present but staples visible, ND, +BS Psych: A&Ox3 with an appropriate affect.    Lab Results:  Recent Labs    05/27/22 0545 05/28/22 0500  WBC 9.0 9.8  HGB 7.9* 7.9*  HCT 25.5* 25.4*  PLT 202 205   BMET Recent Labs    05/26/22 1114 05/27/22 0545  NA 136 139  K 3.9 3.7  CL 104 108  CO2 25 24  GLUCOSE 106* 79  BUN 12 12  CREATININE 1.05* 0.92  CALCIUM 7.6* 8.0*   PT/INR No results for input(s): "LABPROT", "INR" in the last 72 hours. CMP     Component Value Date/Time   NA 139 05/27/2022 0545   NA 142 04/26/2018 1333   K 3.7 05/27/2022 0545   CL 108 05/27/2022 0545   CO2 24 05/27/2022 0545   GLUCOSE 79 05/27/2022 0545   BUN 12 05/27/2022 0545   BUN 22 04/26/2018 1333   CREATININE 0.92 05/27/2022 0545   CREATININE 1.31 (H) 05/22/2022 1255   CREATININE 1.32 (H) 06/04/2021 0000   CALCIUM 8.0 (L) 05/27/2022 0545   PROT 6.7 05/24/2022 0659   PROT 7.3 01/10/2018 1350   ALBUMIN 3.4 (L) 05/24/2022 0659   ALBUMIN 4.7 01/10/2018 1350   AST 16 05/24/2022 0659    AST 10 (L) 05/22/2022 1255   ALT 10 05/24/2022 0659   ALT 5 05/22/2022 1255   ALKPHOS 80 05/24/2022 0659   BILITOT 0.7 05/24/2022 0659   BILITOT 0.4 05/22/2022 1255   GFRNONAA >60 05/27/2022 0545   GFRNONAA 45 (L) 05/22/2022 1255   GFRNONAA 43 (L) 06/04/2021 0000   GFRAA 50 (L) 06/04/2021 0000   Lipase     Component Value Date/Time   LIPASE 19 05/23/2022 2250       Studies/Results: No results found.  Anti-infectives: Anti-infectives (From admission, onward)    Start     Dose/Rate Route Frequency Ordered Stop   05/25/22 0600  cefoTEtan (CEFOTAN) 2 g in sodium chloride 0.9 % 100 mL IVPB        2 g 200 mL/hr over 30 Minutes Intravenous On call to O.R. 05/24/22 1456 05/25/22 1030   05/24/22 2200  neomycin (MYCIFRADIN) tablet 1,000 mg  Status:  Discontinued       See Hyperspace for full Linked Orders Report.   1,000 mg Oral 3 times per day 05/24/22 1456 05/24/22 1519   05/24/22 2200  metroNIDAZOLE (FLAGYL) tablet 1,000 mg  Status:  Discontinued       See Hyperspace for full Linked Orders Report.   1,000 mg Oral 3 times per day 05/24/22 1456  05/24/22 1519   05/24/22 1700  metroNIDAZOLE (FLAGYL) tablet 1,000 mg  Status:  Discontinued       See Hyperspace for full Linked Orders Report.   1,000 mg Oral 3 times per day on Sun 05/24/22 1519 05/25/22 1327   05/24/22 1700  neomycin (MYCIFRADIN) tablet 1,000 mg  Status:  Discontinued       See Hyperspace for full Linked Orders Report.   1,000 mg Oral 3 times per day on Sun 05/24/22 1519 05/25/22 1327   05/24/22 0500  cefTRIAXone (ROCEPHIN) 1 g in sodium chloride 0.9 % 100 mL IVPB        1 g 200 mL/hr over 30 Minutes Intravenous Every 24 hours 05/24/22 0406          Assessment/Plan Metastatic adenocarcinoma of the bladder Colon mass - metastatic bladder cancer causing LBO POD3 s/p open right hemicolectomy/ partial omentectomy with primary ileocolic anastomosis 6/75/44 - Dr. Georgette Dover - surgical path significant for non-small  cell carcinoma with circumferential involvement and positive pericolic LN - tolerating FLD and having bowel function  - removed NGT, advance to soft diet  - continue to mobilize - work on PO pain control today - may be ready for discharge in the next 1-2 days from a surgical standpoint. Surgical follow up already in [facility]  FEN: soft diet, KVO IVF VTE: SQH ID: no current abx  - below per TRH -  Microcytic anemia  CKD stage IIIa Anxiety/Depression  GERD HTN HLD Hx of EtOH abuse  LOS: 4 days     Norm Parcel, [facility] Surgery 05/28/2022, 9:57 AM Please see Amion for pager number during day hours 7:00am-4:30pm

## 2022-05-28 NOTE — Progress Notes (Signed)
PROGRESS NOTE Mary Stark  PPJ:[id number] DOB: 07-06-1956 DOA: 05/23/2022 PCP: [name], NP   Brief Narrative/Hospital Course: [phone] w/ colon obstruction due to metastatic disease of bladder came in with left side abdominal pain x few days. She Underwent right hemicolectomy  and awaiting return of bowel function.     Subjective: Seen and examined this morning resting comfortably has no new complaints, had 2 BM NG tube is out still having abdominal pain  Assessment and Plan: Principal Problem:   Colonic obstruction from bladder cancer metstasis to ascending colon Active Problems:   Heavy alcohol consumption   AKI (acute kidney injury) (HCC)   Metastasis from bladder cancer (HCC)   CKD (chronic kidney disease) stage 3, GFR 30-59 ml/min (HCC)  Colon mass with metastatic bladder cancer causing obstructions/p open right hemicolectomy/partial omentectomy with primary ileocolic anastomosis 8/33 Postop ileus: Clinically improving.  NG tube removed 6/15.  FLD diet advance to soft diet.  Still with ongoing pain continue p.o. pain medication as per general surgery.  Anticipate discharge tomorrow.    Stage IV adenocarcinoma of the bladder: On FOLFOX q. 14 days, complicated with urinary obstruction S/P urinary changes due to bilateral hydronephrosis on prophylactic antibiotic due to stents for prevention of UTI-cont on Keflex prophylaxis.She needs follow-up with urology and oncology upon discharge.   Microcytic anemia H&H stable, continue to monitor - transfuse if less than 7 g Recent Labs  Lab 05/24/22 0659 05/25/22 0514 05/26/22 1114 05/27/22 0545 05/28/22 0500  HGB 9.5* 8.8* 8.1* 7.9* 7.9*  HCT 29.1* 27.7* 25.6* 25.5* 25.4*    CKDIIIa: Baseline creatinine around 1.4 stable.  Improved to 0.9.  Stable.  Recent Labs  Lab 05/23/22 2250 05/24/22 0659 05/25/22 0514 05/26/22 1114 05/27/22 0545  BUN '17 18 11 12 12  '$ CREATININE 1.41* 1.18*  1.32* 1.18* 1.05* 0.92   Hyponatremia mild  -resolved Anxiety continue Ativan prn GERD continue PPI   DVT prophylaxis: heparin injection 5,000 Units Start: 05/26/22 0600 Code Status:   Code Status: Full Code Family Communication: plan of care discussed with patient at bedside. Patient status is: Inpatient because of ongoing management of postop course awaiting return of bowel function Level of care: Telemetry   Dispo: The patient is from: home            Anticipated disposition: home once bowel function returns/pain improved and cleared by surgery likely in 1 to 2 days  Mobility Assessment (last 72 hours)     Mobility Assessment     Row Name 05/27/22 2003 05/27/22 1200 05/27/22 0827 05/26/22 2200 05/26/22 1129   Does patient have an order for bedrest or is patient medically unstable No - Continue assessment -- No - Continue assessment No - Continue assessment --   What is the highest level of mobility based on the progressive mobility assessment? Level 5 (Walks with assist in room/hall) - Balance while stepping forward/back and can walk in room with assist - Complete Level 5 (Walks with assist in room/hall) - Balance while stepping forward/back and can walk in room with assist - Complete Level 5 (Walks with assist in room/hall) - Balance while stepping forward/back and can walk in room with assist - Complete Level 5 (Walks with assist in room/hall) - Balance while stepping forward/back and can walk in room with assist - Complete Level 5 (Walks with assist in room/hall) - Balance while stepping forward/back and can walk in room with assist - Complete    Row Name 05/25/22 2000  Does patient have an order for bedrest or is patient medically unstable No - Continue assessment                 Objective: Vitals last 24 hrs: Vitals:   05/27/22 0433 05/27/22 1320 05/27/22 2050 05/28/22 0534  BP: (!) 167/91 (!) 172/87 (!) 160/81 (!) 158/93  Pulse: 85 75 80 94  Resp: '18 14 16 16  '$ Temp: 98.4 F (36.9 C) 97.7 F (36.5 C)  98.2 F (36.8 C) 99.2 F (37.3 C)  TempSrc: Oral Oral Oral Oral  SpO2: 96% 96% 98% 96%  Weight:    79.7 kg  Height:       Weight change:   Physical Examination: General exam:Aaox3,pleasant,older than stated age, weak appearing. HEENT:Oral mucosa moist,Ear/Nose WNL grossly,dentition normal. Respiratory system:B/L clear,no use of accessory muscle. Cardiovascular system:S1 & S2 +,No JVD. Gastrointestinal system:Abdomen soft,binder in place,BS+. Nervous System:Alert,awake,moving extremities and grossly non-focal. Extremities: edema neg,distal peripheral pulses palpable.  Skin:No rashes,no icterus. TKP:TWSFKC muscle bulk,tone, power. Port-a-cath+  Medications reviewed:  Scheduled Meds:  ARIPiprazole  10 mg Oral QHS   buPROPion  300 mg Oral Daily   cephALEXin  250 mg Oral QHS   Chlorhexidine Gluconate Cloth  6 each Topical Daily   cycloSPORINE  1 drop Both Eyes Daily   gabapentin  1,200 mg Oral QHS   gabapentin  300 mg Oral Daily   heparin injection (subcutaneous)  5,000 Units Subcutaneous Q8H   ketorolac  30 mg Intravenous Q6H   mirabegron ER  25 mg Oral Daily   pantoprazole (PROTONIX) IV  40 mg Intravenous Q24H   sertraline  25 mg Oral Daily   simvastatin  20 mg Oral QHS   tamsulosin  0.4 mg Oral QHS   Continuous Infusions:  sodium chloride 10 mL/hr at 05/28/22 1275      Diet Order             DIET SOFT Room service appropriate? Yes; Fluid consistency: Thin  Diet effective now                  Intake/Output Summary (Last 24 hours) at 05/28/2022 1051 Last data filed at 05/28/2022 0332 Gross per 24 hour  Intake 1927.73 ml  Output --  Net 1927.73 ml   Net IO Since Admission: 5,005.62 mL [05/28/22 1051]  Wt Readings from Last 3 Encounters:  05/28/22 79.7 kg  05/22/22 78.7 kg  05/19/22 76.8 kg     Unresulted Labs (From admission, onward)     Start     Ordered   05/28/22 0500  CBC  Daily,   R      05/27/22 1347   05/25/22 0000  Prealbumin  Weekly,   R       05/24/22 0730          Data Reviewed: I have personally reviewed following labs and imaging studies CBC: Recent Labs  Lab 05/22/22 1255 05/22/22 1255 05/23/22 2250 05/24/22 0659 05/25/22 0514 05/26/22 1114 05/27/22 0545 05/28/22 0500  WBC 5.7   < > 6.4 6.0 6.8 7.7 9.0 9.8  NEUTROABS 3.5  --  4.6 3.9  --   --  6.3  --   HGB 8.6*   < > 9.8* 9.5* 8.8* 8.1* 7.9* 7.9*  HCT 26.7*  --  30.7* 29.1* 27.7* 25.6* 25.5* 25.4*  MCV 111.3*  --  109.3* 109.4* 112.1* 113.3* 113.3* 112.9*  PLT 158   < > 228 214 204 216 202 205   < > =  values in this interval not displayed.   Basic Metabolic Panel: Recent Labs  Lab 05/23/22 2250 05/24/22 0659 05/25/22 0514 05/26/22 1114 05/27/22 0545  NA 134* 134* 138 136 139  K 4.1 3.9 3.7 3.9 3.7  CL 94* 92* 105 104 108  CO2 '28 30 26 25 24  '$ GLUCOSE 135* 121* 120* 106* 79  BUN '17 18 11 12 12  '$ CREATININE 1.41* 1.18*  1.32* 1.18* 1.05* 0.92  CALCIUM 8.7* 8.7* 8.3* 7.6* 8.0*  MG  --   --  2.3 1.8  --   PHOS  --   --  2.5 3.3  --    GFR: Estimated Creatinine Clearance: 65.8 mL/min (by C-G formula based on SCr of 0.92 mg/dL). Liver Function Tests: Recent Labs  Lab 05/22/22 1255 05/23/22 2250 05/24/22 0659  AST 10* 17 16  ALT '5 9 10  '$ ALKPHOS 77 80 80  BILITOT 0.4 0.6 0.7  PROT 6.3* 6.9 6.7  ALBUMIN 3.5 3.4* 3.4*   Recent Labs  Lab 05/23/22 2250  LIPASE 19   No results for input(s): "AMMONIA" in the last 168 hours. Coagulation Profile: No results for input(s): "INR", "PROTIME" in the last 168 hours. BNP (last 3 results) No results for input(s): "PROBNP" in the last 8760 hours. HbA1C: No results for input(s): "HGBA1C" in the last 72 hours. CBG: Recent Labs  Lab 05/24/22 2122  GLUCAP 117*   Lipid Profile: No results for input(s): "CHOL", "HDL", "LDLCALC", "TRIG", "CHOLHDL", "LDLDIRECT" in the last 72 hours. Thyroid Function Tests: No results for input(s): "TSH", "T4TOTAL", "FREET4", "T3FREE", "THYROIDAB" in the last 72  hours. Sepsis Labs: No results for input(s): "PROCALCITON", "LATICACIDVEN" in the last 168 hours.  Recent Results (from the past 240 hour(s))  Urine Culture     Status: Abnormal   Collection Time: 05/24/22  1:24 AM   Specimen: Urine, Clean Catch  Result Value Ref Range Status   Specimen Description   Final    URINE, CLEAN CATCH Performed at [facility], [place] [address], [place], [place] [address]    Special Requests   Final    NONE Performed at [facility], [place] [address], [place], [place] [address]    Culture (A)  Final    <10,000 COLONIES/mL INSIGNIFICANT GROWTH Performed at [facility] [address] [place], [place] [address]    Report Status 05/25/2022 FINAL  Final    Antimicrobials: Anti-infectives (From admission, onward)    Start     Dose/Rate Route Frequency Ordered Stop   05/28/22 2200  cephALEXin (KEFLEX) capsule 250 mg        250 mg Oral Daily at bedtime 05/28/22 1051     05/25/22 0600  cefoTEtan (CEFOTAN) 2 g in sodium chloride 0.9 % 100 mL IVPB        2 g 200 mL/hr over 30 Minutes Intravenous On call to O.R. 05/24/22 1456 05/25/22 1030   05/24/22 2200  neomycin (MYCIFRADIN) tablet 1,000 mg  Status:  Discontinued       See Hyperspace for full Linked Orders Report.   1,000 mg Oral 3 times per day 05/24/22 1456 05/24/22 1519   05/24/22 2200  metroNIDAZOLE (FLAGYL) tablet 1,000 mg  Status:  Discontinued       See Hyperspace for full Linked Orders Report.   1,000 mg Oral 3 times per day 05/24/22 1456 05/24/22 1519   05/24/22 1700  metroNIDAZOLE (FLAGYL) tablet 1,000 mg  Status:  Discontinued  See Hyperspace for full Linked Orders Report.   1,000 mg Oral 3 times per day on Sun 05/24/22 1519 05/25/22 1327   05/24/22 1700  neomycin (MYCIFRADIN) tablet 1,000 mg  Status:  Discontinued       See Hyperspace for full Linked Orders Report.   1,000 mg Oral 3 times per day on Sun 05/24/22 1519 05/25/22 1327   05/24/22 0500   cefTRIAXone (ROCEPHIN) 1 g in sodium chloride 0.9 % 100 mL IVPB  Status:  Discontinued        1 g 200 mL/hr over 30 Minutes Intravenous Every 24 hours 05/24/22 0406 05/28/22 1051      Culture/Microbiology    Component Value Date/Time   SDES  05/24/2022 0124    URINE, CLEAN CATCH Performed at [facility], [place] [address] [place], [place] [address]    SPECREQUEST  05/24/2022 0124    NONE Performed at [facility], [place] [address], [place], [place] [address]    CULT (A) 05/24/2022 0124    <10,000 COLONIES/mL INSIGNIFICANT GROWTH Performed at [facility] [address] [place],  [address]    REPTSTATUS 05/25/2022 FINAL 05/24/2022 0124    Other culture-see note  Radiology Studies: No results found.   LOS: 4 days   Antonieta Pert, MD [facility]  05/28/2022, 10:51 AM

## 2022-05-28 NOTE — Progress Notes (Signed)
Physical Therapy Treatment and Discharge from Acute PT Patient Details Name: Mary Stark MRN: [id number] DOB: 1956-06-12 Today's Date: 05/28/2022   History of Present Illness Patient is 66 y.o. female admitted to [facility] for bowel obstruction 2/2 metastatic bladder cancer presented with encephalopathy. Pt now s/p open right hemicolectomy/partial omentectomy with primary ileocolic anastomosis on 05/01/83. Code Stroke called on 6/11 prior to surgery and exam was consistent with encephalopathy overall, with some mild asymmetry of weakness LLE>RLE which appears to be at or near her baseline asymmetry 2/2 prior stroke, CT of head negative, unabel to obtain MRI. PMH significant of stage IV adenocarcinoma of bladder,anemia related to chemo therapy, anxiety , CKDIII,GERD,HTN, HLD ,ETOH dependence.    PT Comments    Pt's cognition improved today compared to previous sessions. Pt able to ambulate in hallway without assistive device or physical assist.  Pt encouraged to mobilize during remainder of acute stay and very agreeable.  RN notified of pt's progress and ability to mobilize without assist.  Pt agreeable to no further acute PT needs at this time, so PT to sign off.    Recommendations for follow up therapy are one component of a multi-disciplinary discharge planning process, led by the attending physician.  Recommendations may be updated based on patient status, additional functional criteria and insurance authorization.  Follow Up Recommendations  No PT follow up     Assistance Recommended at Discharge PRN  Patient can return home with the following     Equipment Recommendations  None recommended by PT    Recommendations for Other Services       Precautions / Restrictions Precautions Precautions: Fall     Mobility  Bed Mobility Overal bed mobility: Needs Assistance Bed Mobility: Rolling, Sidelying to Sit, Sit to Supine Rolling: Supervision Sidelying to sit: Supervision   Sit to  supine: Supervision   General bed mobility comments: uses elevated HOB and rail    Transfers Overall transfer level: Needs assistance Equipment used: None Transfers: Sit to/from Stand Sit to Stand: Supervision                Ambulation/Gait Ambulation/Gait assistance: Supervision Gait Distance (Feet): 300 Feet Assistive device: None Gait Pattern/deviations: Step-through pattern, Decreased stride length       General Gait Details: pt wraps arms around abdomen however no obvious deviations or LOB observed   Stairs             Wheelchair Mobility    Modified Rankin (Stroke Patients Only)       Balance                                            Cognition Arousal/Alertness: Awake/alert Behavior During Therapy: WFL for tasks assessed/performed Overall Cognitive Status: Within Functional Limits for tasks assessed                                          Exercises      General Comments        Pertinent Vitals/Pain Pain Assessment Pain Assessment: Faces Faces Pain Scale: Hurts a little bit Pain Location: abdomen Pain Descriptors / Indicators: Discomfort Pain Intervention(s): Repositioned, Monitored during session    Home Living  Prior Function            PT Goals (current goals can now be found in the care plan section) Progress towards PT goals: Goals met/education completed, patient discharged from PT    Frequency           PT Plan Current plan remains appropriate    Co-evaluation              AM-PAC PT "6 Clicks" Mobility   Outcome Measure  Help needed turning from your back to your side while in a flat bed without using bedrails?: None Help needed moving from lying on your back to sitting on the side of a flat bed without using bedrails?: None Help needed moving to and from a bed to a chair (including a wheelchair)?: A Little Help needed standing up  from a chair using your arms (e.g., wheelchair or bedside chair)?: A Little Help needed to walk in hospital room?: A Little Help needed climbing 3-5 steps with a railing? : A Little 6 Click Score: 20    End of Session   Activity Tolerance: Patient tolerated treatment well Patient left: in bed;with call bell/phone within reach;with family/visitor present Nurse Communication: Mobility status PT Visit Diagnosis: Difficulty in walking, not elsewhere classified (R26.2)     Time: [id number] PT Time Calculation (min) (ACUTE ONLY): 14 min  Charges:  $Gait Training: 8-22 mins                     Arlyce Dice, DPT Acute Rehabilitation Services Pager: [phone] Office: [place] 05/28/2022, 3:18 PM

## 2022-05-29 LAB — PREPARE RBC (CROSSMATCH)

## 2022-05-29 LAB — CBC
HCT: 22.7 % — ABNORMAL LOW (ref 36.0–46.0)
Hemoglobin: 7 g/dL — ABNORMAL LOW (ref 12.0–15.0)
MCH: 34.8 pg — ABNORMAL HIGH (ref 26.0–34.0)
MCHC: 30.8 g/dL (ref 30.0–36.0)
MCV: 112.9 fL — ABNORMAL HIGH (ref 80.0–100.0)
Platelets: 203 10*3/uL (ref 150–400)
RBC: 2.01 MIL/uL — ABNORMAL LOW (ref 3.87–5.11)
RDW: 15.8 % — ABNORMAL HIGH (ref 11.5–15.5)
WBC: 9.8 10*3/uL (ref 4.0–10.5)
nRBC: 0.2 % (ref 0.0–0.2)

## 2022-05-29 LAB — HEMOGLOBIN AND HEMATOCRIT, BLOOD
HCT: 25.6 % — ABNORMAL LOW (ref 36.0–46.0)
Hemoglobin: 8.2 g/dL — ABNORMAL LOW (ref 12.0–15.0)

## 2022-05-29 MED ORDER — SODIUM CHLORIDE 0.9% IV SOLUTION: Freq: Once | INTRAVENOUS | Status: AC

## 2022-05-29 MED ORDER — METHOCARBAMOL 500 MG PO TABS
500.0000 mg | ORAL_TABLET | Freq: Three times a day (TID) | ORAL | Status: DC
  Administered 2022-05-29 – 2022-05-30 (×4): 500 mg via ORAL
  Filled 2022-05-29 (×4): qty 1

## 2022-05-29 NOTE — Progress Notes (Signed)
PROGRESS NOTE CORRINA STEFFENSEN  IFO:[id number] DOB: November 22, 1956 DOA: 05/23/2022 PCP: [name], NP   Brief Narrative/Hospital Course: [phone] w/ colon obstruction due to metastatic disease of bladder came in with left side abdominal pain x few days. She Underwent right hemicolectomy  and awaiting return of bowel function.     Subjective: Seen and examined this morning.   She feels weak and tired this am Has had Bms denies blood or black stool  Assessment and Plan: Principal Problem:   Colonic obstruction from bladder cancer metstasis to ascending colon Active Problems:   Heavy alcohol consumption   AKI (acute kidney injury) ([place])   Metastasis from bladder cancer (HCC)   CKD (chronic kidney disease) stage 3, GFR 30-59 ml/min (HCC)  Colon mass with metastatic bladder cancer causing obstructions/p open right hemicolectomy/partial omentectomy with primary ileocolic anastomosis 0/94 Postop ileus: Clinically improving.  NG tube removed 6/15.  FLD diet advance to soft diet.  Still with ongoing pain continue p.o. pain medication as per general surgery.  Anticipate discharge tomorrow.    Stage IV adenocarcinoma of the bladder: On FOLFOX q. 14 days, complicated with urinary obstruction S/P urinary changes due to bilateral hydronephrosis on prophylactic antibiotic due to stents for prevention of UTI-cont on Keflex prophylaxis.She needs follow-up with urology and oncology upon discharge.   Microcytic anemia H&H downtrending and appears symptomatic with tiredness and fatigue.  Previously used to get multiple blood transfusion about 8 transfusional past year, disease anemia of chronic disease/anemia of malignancy.  No acute blood loss noted.  Discussed risk benefits alternatives at this point agrees for 1 unit PRBC transfusion Recent Labs  Lab 05/25/22 0514 05/26/22 1114 05/27/22 0545 05/28/22 0500 05/29/22 0500  HGB 8.8* 8.1* 7.9* 7.9* 7.0*  HCT 27.7* 25.6* 25.5* 25.4* 22.7*     CKDIIIa:  Baseline creatinine around 1.4 stable.  Improved to 0.9.  Stable.  Recent Labs  Lab 05/23/22 2250 05/24/22 0659 05/25/22 0514 05/26/22 1114 05/27/22 0545  BUN '17 18 11 12 12  '$ CREATININE 1.41* 1.18*  1.32* 1.18* 1.05* 0.92    Hyponatremia mild -resolved Anxiety continue Ativan prn GERD continue PPI   DVT prophylaxis: heparin injection 5,000 Units Start: 05/26/22 0600 Code Status:   Code Status: Full Code Family Communication: plan of care discussed with patient at bedside. Patient status is: Inpatient because of ongoing management of postop course awaiting return of bowel function and hemoglobin stabilization Level of care: Telemetry   Dispo: The patient is from: home            Anticipated disposition: home tomorrow if hemoglobin remains stable and tolerating diet.  Discussed with surgery-transfusing 1 unit PRBC today  Mobility Assessment (last 72 hours)     Mobility Assessment     Row Name 05/28/22 1516 05/28/22 0900 05/27/22 2003 05/27/22 1200 05/27/22 0827   Does patient have an order for bedrest or is patient medically unstable -- No - Continue assessment No - Continue assessment -- No - Continue assessment   What is the highest level of mobility based on the progressive mobility assessment? Level 5 (Walks with assist in room/hall) - Balance while stepping forward/back and can walk in room with assist - Complete Level 6 (Walks independently in room and hall) - Balance while walking in room without assist - Complete Level 5 (Walks with assist in room/hall) - Balance while stepping forward/back and can walk in room with assist - Complete Level 5 (Walks with assist in room/hall) - Balance while stepping forward/back  and can walk in room with assist - Complete Level 5 (Walks with assist in room/hall) - Balance while stepping forward/back and can walk in room with assist - Complete    Row Name 05/26/22 2200 05/26/22 1129         Does patient have an order for bedrest or is  patient medically unstable No - Continue assessment --      What is the highest level of mobility based on the progressive mobility assessment? Level 5 (Walks with assist in room/hall) - Balance while stepping forward/back and can walk in room with assist - Complete Level 5 (Walks with assist in room/hall) - Balance while stepping forward/back and can walk in room with assist - Complete                Objective: Vitals last 24 hrs: Vitals:   05/28/22 0534 05/28/22 1441 05/28/22 2026 05/29/22 0500  BP: (!) 158/93 121/73 124/75 (!) 162/84  Pulse: 94 86 80 90  Resp: '16 15 16 18  '$ Temp: 99.2 F (37.3 C) 97.7 F (36.5 C) 97.6 F (36.4 C) 98.3 F (36.8 C)  TempSrc: Oral Oral Oral Oral  SpO2: 96% 94% 94% 96%  Weight: 79.7 kg     Height:       Weight change:   Physical Examination: General exam: AA, older than stated age, weak appearing. HEENT:Oral mucosa moist, Ear/Nose WNL grossly, dentition normal. Respiratory system: bilaterally diminished, no use of accessory muscle Cardiovascular system: S1 & S2 +, No JVD,. Gastrointestinal system: Abdomen soft, surgical site and staple intact with honeycomb dressing, bruise present on left lower quadrant.   Nervous System:Alert, awake, moving extremities and grossly nonfocal Extremities: LE ankle edema neg, distal peripheral pulses palpable.  Skin: No rashes,no icterus. MSK: Normal muscle bulk,tone, power  Port-a-cath+  Medications reviewed:  Scheduled Meds:  sodium chloride   Intravenous Once   ARIPiprazole  10 mg Oral QHS   buPROPion  300 mg Oral Daily   cephALEXin  250 mg Oral QHS   Chlorhexidine Gluconate Cloth  6 each Topical Daily   cycloSPORINE  1 drop Both Eyes Daily   gabapentin  1,200 mg Oral QHS   gabapentin  300 mg Oral Daily   heparin injection (subcutaneous)  5,000 Units Subcutaneous Q8H   ketorolac  30 mg Intravenous Q6H   methocarbamol  500 mg Oral TID   mirabegron ER  25 mg Oral Daily   pantoprazole  40 mg Oral  Daily   sertraline  25 mg Oral Daily   simvastatin  20 mg Oral QHS   tamsulosin  0.4 mg Oral QHS   Continuous Infusions:  sodium chloride 10 mL/hr at 05/28/22 1191      Diet Order             DIET SOFT Room service appropriate? Yes; Fluid consistency: Thin  Diet effective now                  Intake/Output Summary (Last 24 hours) at 05/29/2022 0854 Last data filed at 05/28/2022 2100 Gross per 24 hour  Intake 480 ml  Output --  Net 480 ml    Net IO Since Admission: 6,025.62 mL [05/29/22 0854]  Wt Readings from Last 3 Encounters:  05/28/22 79.7 kg  05/22/22 78.7 kg  05/19/22 76.8 kg     Unresulted Labs (From admission, onward)     Start     Ordered   05/29/22 0855  Prepare RBC (crossmatch)  (Adult Blood  Administration - Red Blood Cells)  Once,   R       Question Answer Comment  # of Units 1 unit   Transfusion Indications Symptomatic Anemia   Number of Units to Keep Ahead NO units ahead   If emergent release call blood bank Not emergent release      05/29/22 0854   05/29/22 0855  Type and screen [place]  Once,   R       Comments: [place]    05/29/22 0854   05/28/22 0500  CBC  Daily,   R      05/27/22 1347   05/25/22 0000  Prealbumin  Weekly,   R (with TIMED occurrences)      05/24/22 0730          Data Reviewed: I have personally reviewed following labs and imaging studies CBC: Recent Labs  Lab 05/22/22 1255 05/22/22 1255 05/23/22 2250 05/24/22 0659 05/25/22 0514 05/26/22 1114 05/27/22 0545 05/28/22 0500 05/29/22 0500  WBC 5.7   < > 6.4 6.0 6.8 7.7 9.0 9.8 9.8  NEUTROABS 3.5  --  4.6 3.9  --   --  6.3  --   --   HGB 8.6*   < > 9.8* 9.5* 8.8* 8.1* 7.9* 7.9* 7.0*  HCT 26.7*  --  30.7* 29.1* 27.7* 25.6* 25.5* 25.4* 22.7*  MCV 111.3*  --  109.3* 109.4* 112.1* 113.3* 113.3* 112.9* 112.9*  PLT 158   < > 228 214 204 216 202 205 203   < > = values in this interval not displayed.    Basic Metabolic  Panel: Recent Labs  Lab 05/23/22 2250 05/24/22 0659 05/25/22 0514 05/26/22 1114 05/27/22 0545  NA 134* 134* 138 136 139  K 4.1 3.9 3.7 3.9 3.7  CL 94* 92* 105 104 108  CO2 '28 30 26 25 24  '$ GLUCOSE 135* 121* 120* 106* 79  BUN '17 18 11 12 12  '$ CREATININE 1.41* 1.18*  1.32* 1.18* 1.05* 0.92  CALCIUM 8.7* 8.7* 8.3* 7.6* 8.0*  MG  --   --  2.3 1.8  --   PHOS  --   --  2.5 3.3  --     GFR: Estimated Creatinine Clearance: 65.8 mL/min (by C-G formula based on SCr of 0.92 mg/dL). Liver Function Tests: Recent Labs  Lab 05/22/22 1255 05/23/22 2250 05/24/22 0659  AST 10* 17 16  ALT '5 9 10  '$ ALKPHOS 77 80 80  BILITOT 0.4 0.6 0.7  PROT 6.3* 6.9 6.7  ALBUMIN 3.5 3.4* 3.4*    Recent Labs  Lab 05/23/22 2250  LIPASE 19    No results for input(s): "AMMONIA" in the last 168 hours. Coagulation Profile: No results for input(s): "INR", "PROTIME" in the last 168 hours. BNP (last 3 results) No results for input(s): "PROBNP" in the last 8760 hours. HbA1C: No results for input(s): "HGBA1C" in the last 72 hours. CBG: Recent Labs  Lab 05/24/22 2122  GLUCAP 117*    Lipid Profile: No results for input(s): "CHOL", "HDL", "LDLCALC", "TRIG", "CHOLHDL", "LDLDIRECT" in the last 72 hours. Thyroid Function Tests: No results for input(s): "TSH", "T4TOTAL", "FREET4", "T3FREE", "THYROIDAB" in the last 72 hours. Sepsis Labs: No results for input(s): "PROCALCITON", "LATICACIDVEN" in the last 168 hours.  Recent Results (from the past 240 hour(s))  Urine Culture     Status: Abnormal   Collection Time: 05/24/22  1:24 AM   Specimen: Urine, Clean Catch  Result Value Ref Range  Status   Specimen Description   Final    URINE, CLEAN CATCH Performed at [facility], [place] [address], [place], [place] [address]    Special Requests   Final    NONE Performed at [facility], [place] [address] [place], [place] [address]    Culture (A)  Final    <10,000 COLONIES/mL  INSIGNIFICANT GROWTH Performed at [place] [address], [place], [place] [address]    Report Status 05/25/2022 FINAL  Final    Antimicrobials: Anti-infectives (From admission, onward)    Start     Dose/Rate Route Frequency Ordered Stop   05/28/22 2200  cephALEXin (KEFLEX) capsule 250 mg        250 mg Oral Daily at bedtime 05/28/22 1051     05/25/22 0600  cefoTEtan (CEFOTAN) 2 g in sodium chloride 0.9 % 100 mL IVPB        2 g 200 mL/hr over 30 Minutes Intravenous On call to O.R. 05/24/22 1456 05/25/22 1030   05/24/22 2200  neomycin (MYCIFRADIN) tablet 1,000 mg  Status:  Discontinued       See Hyperspace for full Linked Orders Report.   1,000 mg Oral 3 times per day 05/24/22 1456 05/24/22 1519   05/24/22 2200  metroNIDAZOLE (FLAGYL) tablet 1,000 mg  Status:  Discontinued       See Hyperspace for full Linked Orders Report.   1,000 mg Oral 3 times per day 05/24/22 1456 05/24/22 1519   05/24/22 1700  metroNIDAZOLE (FLAGYL) tablet 1,000 mg  Status:  Discontinued       See Hyperspace for full Linked Orders Report.   1,000 mg Oral 3 times per day on Sun 05/24/22 1519 05/25/22 1327   05/24/22 1700  neomycin (MYCIFRADIN) tablet 1,000 mg  Status:  Discontinued       See Hyperspace for full Linked Orders Report.   1,000 mg Oral 3 times per day on Sun 05/24/22 1519 05/25/22 1327   05/24/22 0500  cefTRIAXone (ROCEPHIN) 1 g in sodium chloride 0.9 % 100 mL IVPB  Status:  Discontinued        1 g 200 mL/hr over 30 Minutes Intravenous Every 24 hours 05/24/22 0406 05/28/22 1051      Culture/Microbiology    Component Value Date/Time   SDES  05/24/2022 0124    URINE, CLEAN CATCH Performed at [facility], [place] [address] [place], [place] [address]    SPECREQUEST  05/24/2022 0124    NONE Performed at [facility], [place] [address], [place], [place] [address]    CULT (A) 05/24/2022 0124    <10,000 COLONIES/mL INSIGNIFICANT GROWTH Performed at  [place] [address] [place],  [address]    REPTSTATUS 05/25/2022 FINAL 05/24/2022 0124    Other culture-see note  Radiology Studies: No results found.   LOS: 5 days   Antonieta Pert, MD [facility]  05/29/2022, 8:54 AM

## 2022-05-29 NOTE — Plan of Care (Signed)
  Problem: Education: Goal: Knowledge of General Education information will improve Description: Including pain rating scale, medication(s)/side effects and non-pharmacologic comfort measures Outcome: Progressing   Problem: Health Behavior/Discharge Planning: Goal: Ability to manage health-related needs will improve Outcome: Progressing   Problem: Clinical Measurements: Goal: Ability to maintain clinical measurements within normal limits will improve Outcome: Progressing Goal: Will remain free from infection Outcome: Progressing Goal: Diagnostic test results will improve Outcome: Progressing Goal: Respiratory complications will improve Outcome: Progressing Goal: Cardiovascular complication will be avoided Outcome: Progressing   Problem: Activity: Goal: Risk for activity intolerance will decrease Outcome: Progressing   Problem: Nutrition: Goal: Adequate nutrition will be maintained Outcome: Progressing   Problem: Coping: Goal: Level of anxiety will decrease Outcome: Progressing   Problem: Elimination: Goal: Will not experience complications related to bowel motility Outcome: Progressing Goal: Will not experience complications related to urinary retention Outcome: Progressing   Problem: Pain Managment: Goal: General experience of comfort will improve Outcome: Progressing   Problem: Safety: Goal: Ability to remain free from injury will improve Outcome: Progressing   Problem: Skin Integrity: Goal: Risk for impaired skin integrity will decrease Outcome: Progressing   Problem: Education: Goal: Understanding of discharge needs will improve Outcome: Progressing Goal: Verbalization of understanding of the causes of altered bowel function will improve Outcome: Progressing   Problem: Activity: Goal: Ability to tolerate increased activity will improve Outcome: Progressing   Problem: Bowel/Gastric: Goal: Gastrointestinal status for postoperative course will  improve Outcome: Progressing   Problem: Health Behavior/Discharge Planning: Goal: Identification of community resources to assist with postoperative recovery needs will improve Outcome: Progressing   Problem: Nutritional: Goal: Will attain and maintain optimal nutritional status will improve Outcome: Progressing   Problem: Clinical Measurements: Goal: Postoperative complications will be avoided or minimized Outcome: Progressing   Problem: Respiratory: Goal: Respiratory status will improve Outcome: Progressing   Problem: Skin Integrity: Goal: Will show signs of wound healing Outcome: Progressing   

## 2022-05-29 NOTE — Progress Notes (Signed)
Progress Note  4 Days Post-Op  Subjective: Pt tolerating soft diet and having bowel function. No bloody or black stools. Feels tired this AM. Pain control improving but she is having some pain this AM.   Objective: Vital signs in last 24 hours: Temp:  [97.6 F (36.4 C)-98.3 F (36.8 C)] 98.3 F (36.8 C) (06/16 0500) Pulse Rate:  [80-90] 90 (06/16 0500) Resp:  [15-18] 18 (06/16 0500) BP: (121-162)/(73-84) 162/84 (06/16 0500) SpO2:  [94 %-96 %] 96 % (06/16 0500) Last BM Date : 05/28/22  Intake/Output from previous day: 06/15 0701 - 06/16 0700 In: 1020 [P.O.:1020] Out: -  Intake/Output this shift: No intake/output data recorded.  PE: General: pleasant, WD female who is laying in bed in NAD Heart: regular, rate, and rhythm.   Lungs: Respiratory effort nonlabored Abd: soft, appropriately ttp, incision with honeycomb present but staples visible, ND, +BS Psych: A&Ox3 with an appropriate affect.    Lab Results:  Recent Labs    05/28/22 0500 05/29/22 0500  WBC 9.8 9.8  HGB 7.9* 7.0*  HCT 25.4* 22.7*  PLT 205 203   BMET Recent Labs    05/26/22 1114 05/27/22 0545  NA 136 139  K 3.9 3.7  CL 104 108  CO2 25 24  GLUCOSE 106* 79  BUN 12 12  CREATININE 1.05* 0.92  CALCIUM 7.6* 8.0*   PT/INR No results for input(s): "LABPROT", "INR" in the last 72 hours. CMP     Component Value Date/Time   NA 139 05/27/2022 0545   NA 142 04/26/2018 1333   K 3.7 05/27/2022 0545   CL 108 05/27/2022 0545   CO2 24 05/27/2022 0545   GLUCOSE 79 05/27/2022 0545   BUN 12 05/27/2022 0545   BUN 22 04/26/2018 1333   CREATININE 0.92 05/27/2022 0545   CREATININE 1.31 (H) 05/22/2022 1255   CREATININE 1.32 (H) 06/04/2021 0000   CALCIUM 8.0 (L) 05/27/2022 0545   PROT 6.7 05/24/2022 0659   PROT 7.3 01/10/2018 1350   ALBUMIN 3.4 (L) 05/24/2022 0659   ALBUMIN 4.7 01/10/2018 1350   AST 16 05/24/2022 0659   AST 10 (L) 05/22/2022 1255   ALT 10 05/24/2022 0659   ALT 5 05/22/2022 1255    ALKPHOS 80 05/24/2022 0659   BILITOT 0.7 05/24/2022 0659   BILITOT 0.4 05/22/2022 1255   GFRNONAA >60 05/27/2022 0545   GFRNONAA 45 (L) 05/22/2022 1255   GFRNONAA 43 (L) 06/04/2021 0000   GFRAA 50 (L) 06/04/2021 0000   Lipase     Component Value Date/Time   LIPASE 19 05/23/2022 2250       Studies/Results: No results found.  Anti-infectives: Anti-infectives (From admission, onward)    Start     Dose/Rate Route Frequency Ordered Stop   05/28/22 2200  cephALEXin (KEFLEX) capsule 250 mg        250 mg Oral Daily at bedtime 05/28/22 1051     05/25/22 0600  cefoTEtan (CEFOTAN) 2 g in sodium chloride 0.9 % 100 mL IVPB        2 g 200 mL/hr over 30 Minutes Intravenous On call to O.R. 05/24/22 1456 05/25/22 1030   05/24/22 2200  neomycin (MYCIFRADIN) tablet 1,000 mg  Status:  Discontinued       See Hyperspace for full Linked Orders Report.   1,000 mg Oral 3 times per day 05/24/22 1456 05/24/22 1519   05/24/22 2200  metroNIDAZOLE (FLAGYL) tablet 1,000 mg  Status:  Discontinued       See  Hyperspace for full Linked Orders Report.   1,000 mg Oral 3 times per day 05/24/22 1456 05/24/22 1519   05/24/22 1700  metroNIDAZOLE (FLAGYL) tablet 1,000 mg  Status:  Discontinued       See Hyperspace for full Linked Orders Report.   1,000 mg Oral 3 times per day on Sun 05/24/22 1519 05/25/22 1327   05/24/22 1700  neomycin (MYCIFRADIN) tablet 1,000 mg  Status:  Discontinued       See Hyperspace for full Linked Orders Report.   1,000 mg Oral 3 times per day on Sun 05/24/22 1519 05/25/22 1327   05/24/22 0500  cefTRIAXone (ROCEPHIN) 1 g in sodium chloride 0.9 % 100 mL IVPB  Status:  Discontinued        1 g 200 mL/hr over 30 Minutes Intravenous Every 24 hours 05/24/22 0406 05/28/22 1051        Assessment/Plan Metastatic adenocarcinoma of the bladder Colon mass - metastatic bladder cancer causing LBO POD4 s/p open right hemicolectomy/ partial omentectomy with primary ileocolic anastomosis 9/62/83 -  Dr. 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 Surgical follow up already in [facility]   FEN: soft diet, KVO IVF VTE: SQH ID: no current abx   - below per [facility] -  Microcytic anemia - hgb 7.0 this AM, transfusion per TRH CKD stage IIIa Anxiety/Depression  GERD HTN HLD Hx of EtOH abuse   LOS: 5 days    Norm Parcel, [facility] 05/29/2022, 8:32 AM Please see Amion for pager number during day hours 7:00am-4:30pm

## 2022-05-30 LAB — TYPE AND SCREEN
ABO/RH(D): A POS
Antibody Screen: NEGATIVE
Unit division: 0

## 2022-05-30 LAB — BPAM RBC
Blood Product Expiration Date: 202307092359
ISSUE DATE / TIME: 202306161346
Unit Type and Rh: 6200

## 2022-05-30 LAB — CBC
HCT: 26.5 % — ABNORMAL LOW (ref 36.0–46.0)
Hemoglobin: 8.2 g/dL — ABNORMAL LOW (ref 12.0–15.0)
MCH: 33.1 pg (ref 26.0–34.0)
MCHC: 30.9 g/dL (ref 30.0–36.0)
MCV: 106.9 fL — ABNORMAL HIGH (ref 80.0–100.0)
Platelets: 206 10*3/uL (ref 150–400)
RBC: 2.48 MIL/uL — ABNORMAL LOW (ref 3.87–5.11)
RDW: 19.5 % — ABNORMAL HIGH (ref 11.5–15.5)
WBC: 10.7 10*3/uL — ABNORMAL HIGH (ref 4.0–10.5)
nRBC: 0 % (ref 0.0–0.2)

## 2022-05-30 MED ORDER — HYDRALAZINE HCL 20 MG/ML IJ SOLN: 10.0000 mg | INTRAMUSCULAR | Status: DC | PRN

## 2022-05-30 NOTE — Discharge Summary (Signed)
Physician Discharge Summary  Mary Stark GEX:[id number] DOB: 1956-09-11 DOA: 05/23/2022  PCP: [name], NP  Admit date: 05/23/2022 Discharge date: 05/30/2022 Recommendations for Outpatient Follow-up:  Follow up with PCP in 1 weeks-call for appointment Please obtain BMP/CBC in one week  Discharge Dispo: Home Discharge Condition: Stable Code Status:   Code Status: Full Code Diet recommendation:  Diet Order             DIET SOFT Room service appropriate? Yes; Fluid consistency: Thin  Diet effective now                   Brief/Interim Summary: 65yof w/ colon obstruction due to metastatic disease of bladder came in with left side abdominal pain x few days. She Underwent right hemicolectomy  and awaiting return of bowel function.  Subsequently patient had return of her bowel function [place] tolerating diet noted to have downtrending hemoglobin transfuse 1 unit PRBC 6/16 with appropriate improvement in her hemoglobin.  At this time she is medically stable.  She has been cleared for discharge by surgical team.  She is ambulating well no further PT OT Patient is being discharged home in medically stable condition   Discharge Diagnoses:  Principal Problem:   Colonic obstruction from bladder cancer metstasis to ascending colon Active Problems:   Heavy alcohol consumption   AKI (acute kidney injury) ([facility])   Metastasis from bladder cancer ([facility])   CKD (chronic kidney disease) stage 3, GFR 30-59 ml/min ([facility])  Colon mass with metastatic bladder cancer causing obstructions/p open right hemicolectomy/partial omentectomy with primary ileocolic anastomosis 0/10 Postop ileus: Resolved, tolerating diet at this time.  Cleared for discharge by surgical team.  She will follow-up with surgery on June/26 for staple removal, instruction for wound provided by general surgery-accordingly can Remove the dressing at home, follow-up outpatient with general surgery.  Stage IV adenocarcinoma of the  bladder: On FOLFOX q. 14 days, complicated with urinary obstruction S/P urinary changes due to bilateral hydronephrosis on prophylactic antibiotic due to stents for prevention of UTI-cont on Keflex prophylaxis.She needs follow-up with urology and oncology upon discharge.    Microcytic anemia H&H downtrending and appears symptomatic with tiredness and fatigue.  Previously used to get multiple blood transfusion about 8 transfusios past year, suspect from anemia of chronic disease/anemia of malignancy.  No acute blood loss noted. S/p 1 unit and hb up.  Follow-up with PCP with CBC next week Recent Labs  Lab 05/27/22 0545 05/28/22 0500 05/29/22 0500 05/29/22 2000 05/30/22 0500  HGB 7.9* 7.9* 7.0* 8.2* 8.2*  HCT 25.5* 25.4* 22.7* 25.6* 26.5*      CKDIIIa: Baseline creatinine around 1.4 stable.  Improved to 0.9.  Stable.  Hyponatremia mild -resolved Anxiety continue Ativan prn GERD continue PPI   Consults: General surgery Subjective: Seen this am ,doing well Has been tolerating diet and having bowel movements.  Afebrile overnight  Discharge Exam: Vitals:   05/29/22 2007 05/30/22 0513  BP: (!) 147/81 (!) 172/88  Pulse: 88 77  Resp: 18 18  Temp: 98.6 F (37 C) 98.2 F (36.8 C)  SpO2: 95% 97%   General: Pt is alert, awake, not in acute distress Cardiovascular: RRR, S1/S2 +, no rubs, no gallops Respiratory: CTA bilaterally, no wheezing, no rhonchi Abdominal: Soft, NT, ND, bowel sounds + Extremities: no edema, no cyanosis  Discharge Instructions  Discharge Instructions     Discharge instructions   Complete by: As directed    Please call call MD or return to ER  for similar or worsening recurring problem that brought you to hospital or if any fever,nausea/vomiting,abdominal pain, uncontrolled pain, chest pain,  shortness of breath or any other alarming symptoms.  Please follow-up your doctor as instructed in a week time and call the office for appointment.  Please avoid alcohol,  smoking, or any other illicit substance and maintain healthy habits including taking your regular medications as prescribed.  You were cared for by a hospitalist during your hospital stay. If you have any questions about your discharge medications or the care you received while you were in the hospital after you are discharged, you can call the unit and ask to speak with the hospitalist on call if the hospitalist that took care of you is not available.  Once you are discharged, your primary care physician will handle any further medical issues. Please note that NO REFILLS for any discharge medications will be authorized once you are discharged, as it is imperative that you return to your primary care physician (or establish a relationship with a primary care physician if you do not have one) for your aftercare needs so that they can reassess your need for medications and monitor your lab values   Increase activity slowly   Complete by: As directed    No dressing needed   Complete by: As directed    Call surgeon office for wound care. Keep wound dry- staples need to come out on 6/16 ar surgeon's office      Allergies as of 05/30/2022       Reactions   Metformin And Related Other (See Comments)   Lactic acidosis    Penicillins Hives, Rash   Reports hives to penicillin and amoxicillin as an adult "a long time ago." Has tolerated cefdinir (09/2020) and cefepime (10/2020) before.   Metformin Diarrhea   Extreme diarrhea and lactic acidosis   Penicillin G Sodium Hives, Rash        Medication List     TAKE these medications    albuterol 108 (90 Base) MCG/ACT inhaler Commonly known as: ProAir HFA INHALE 1 TO 2 PUFFS BY MOUTH INTO THE LUNGS EVERY 4 HOURS AS NEEDED FOR WHEEZING OR SHORTNESS OF BREATH   ARIPiprazole 10 MG tablet Commonly known as: ABILIFY TAKE 1 TABLET BY MOUTH ONCE DAILY What changed:  how much to take when to take this   azelastine 0.1 % nasal spray Commonly known  as: ASTELIN Place 2 sprays into both nostrils 2 (two) times daily. Use in each nostril as directed What changed:  when to take this reasons to take this   bisacodyl 5 MG EC tablet Commonly known as: Dulcolax Take 2 tablets by mouth twice a day. What changed:  how much to take when to take this   bisacodyl 5 MG EC tablet Commonly known as: Dulcolax Take 2 tablets (10 mg total) by mouth 2 (two) times daily. What changed: Another medication with the same name was changed. Make sure you understand how and when to take each.   buPROPion 300 MG 24 hr tablet Commonly known as: WELLBUTRIN XL TAKE 1 TABLET BY MOUTH EVERY DAY   cephALEXin 250 MG capsule Commonly known as: KEFLEX Take 1 capsule by mouth nightly as directed What changed:  how much to take how to take this when to take this additional instructions   clonazePAM 1 MG tablet Commonly known as: KLONOPIN Take 1 tablet (1 mg total) by mouth at bedtime.   gabapentin 300 MG capsule Commonly known as:  NEURONTIN Take 1 capsule by mouth daily and 4 capsules at bedtime as directed. What changed:  how much to take how to take this when to take this additional instructions   HYDROcodone-acetaminophen 5-325 MG tablet Commonly known as: Norco Take 1 tablet by mouth every 6 (six) hours as needed for severe pain.   lidocaine-prilocaine cream Commonly known as: EMLA Apply 1 application topically as needed. What changed: reasons to take this   Myrbetriq 25 MG Tb24 tablet Generic drug: mirabegron ER TAKE 1 TABLET BY MOUTH ONCE A DAY What changed: how much to take   Nebivolol HCl 20 MG Tabs Take 1/2 tablet (10 mg total) by mouth daily.   omeprazole 20 MG capsule Commonly known as: PRILOSEC Take 1 capsule (20 mg total) by mouth daily.   ondansetron 4 MG tablet Commonly known as: Zofran Take 1 tablet (4 mg total) by mouth every 8 (eight) hours as needed for nausea or vomiting.   oxymetazoline 0.05 % nasal  spray Commonly known as: AFRIN Place 1 spray into both nostrils 2 (two) times daily as needed for congestion. For nose bleeds   polyethylene glycol-electrolytes 420 g solution Commonly known as: NuLYTELY Use as directed   polyethylene glycol-electrolytes 420 g solution Commonly known as: NuLYTELY Take as directed over 2 days.   Restasis 0.05 % ophthalmic emulsion Generic drug: cycloSPORINE Place 1 drop both eyes twice a day What changed:  how much to take how to take this when to take this   sertraline 25 MG tablet Commonly known as: ZOLOFT Take 1 tablet (25 mg total) by mouth daily.   simvastatin 20 MG tablet Commonly known as: ZOCOR TAKE 1 TABLET (20 MG TOTAL) BY MOUTH DAILY. What changed:  how much to take when to take this   tamsulosin 0.4 MG Caps capsule Commonly known as: FLOMAX TAKE 1 CAPSULE BY MOUTH AT BEDTIME What changed: how much to take   Uro-MP 118 MG Caps Take one capsule by mouth 3 times daily               Discharge Care Instructions  (From admission, onward)           Start     Ordered   05/30/22 0000  No dressing needed       Comments: Call surgeon office for wound care. Keep wound dry- staples need to come out on 6/16 ar surgeon's office   05/30/22 0956            Follow-up Information     [facility]. Go on 06/08/2022.   Specialty: General Surgery Why: 2:30 PM For staple removal. Please arrive 30 min prior to appointment time. Have ID and insurance information with you. Contact information: [address] [place] [address] [phone]         Donnie Mesa, MD. Go on 06/22/2022.   Specialty: General Surgery Why: 9:00 AM. Please arrive 15 min prior to appointment time. Contact information: [address] [place] [address] [phone]                Allergies  Allergen Reactions   Metformin And Related Other (See Comments)    Lactic acidosis    Penicillins  Hives and Rash    Reports hives to penicillin and amoxicillin as an adult "a long time ago." Has tolerated cefdinir (09/2020) and cefepime (10/2020) before.   Metformin Diarrhea    Extreme diarrhea and lactic acidosis   Penicillin  G Sodium Hives and Rash    The results of significant diagnostics from this hospitalization (including imaging, microbiology, ancillary and laboratory) are listed below for reference.    Microbiology: Recent Results (from the past 240 hour(s))  Urine Culture     Status: Abnormal   Collection Time: 05/24/22  1:24 AM   Specimen: Urine, Clean Catch  Result Value Ref Range Status   Specimen Description   Final    URINE, CLEAN CATCH Performed at [facility], [place] [address], [place], [place] [address]    Special Requests   Final    NONE Performed at [facility], [place] [address] [place], [place] [address]    Culture (A)  Final    <10,000 COLONIES/mL INSIGNIFICANT GROWTH Performed at [facility] [address], [place], [place] [address]    Report Status 05/25/2022 FINAL  Final    Procedures/Studies: DG Abd 1 View  Result Date: 05/25/2022 CLINICAL DATA:  Concern for retained foreign body EXAM: ABDOMEN - 1 VIEW COMPARISON:  None Available. FINDINGS: Nonobstructive pattern of bowel gas with gas present to the rectum. Status post midline laparotomy. Bilateral double-J ureteral stents with formed pigtails in the expected vicinity of the renal pelves and bladder. No unexpected radiopaque foreign body. No obvious free air in the abdomen. IMPRESSION: No unexpected radiopaque foreign body matching provided description of 1 inch x 1 inch metal square. Findings reported by telephone to operating room to [facility] RN, 12:01 p.m., 05/25/2022 Electronically Signed   By: Delanna Ahmadi M.D.   On: 05/25/2022 12:05   CT HEAD WO CONTRAST  Result Date: 05/24/2022 CLINICAL DATA:  Neuro deficit, acute, stroke suspected. EXAM: CT  HEAD WITHOUT CONTRAST TECHNIQUE: Contiguous axial images were obtained from the base of the skull through the vertex without intravenous contrast. RADIATION DOSE REDUCTION: This exam was performed according to the departmental dose-optimization program which includes automated exposure control, adjustment of the mA and/or kV according to patient size and/or use of iterative reconstruction technique. COMPARISON:  Head CT 10/22/2020 FINDINGS: Brain: There is no evidence of an acute infarct, intracranial hemorrhage, mass, midline shift, or extra-axial fluid collection. Hypodensities in the cerebral white matter bilaterally are unchanged and nonspecific but compatible with mild chronic small vessel ischemic disease. The ventricles and sulci are within normal limits for age. Vascular: No hyperdense vessel. Skull: No fracture or suspicious osseous lesion. Sinuses/Orbits: Paranasal sinuses and mastoid air cells are clear. Unremarkable orbits. Other: None. IMPRESSION: 1. No evidence of acute intracranial abnormality. 2. Mild chronic small vessel ischemic disease. These results were communicated to Dr. Doristine Bosworth at 6:56 pm on 05/24/2022 by text page via the [facility] messaging system. Electronically Signed   By: Logan Bores M.D.   On: 05/24/2022 18:57   DG Chest 2 View  Result Date: 05/24/2022 CLINICAL DATA:  Productive cough and shortness of breath. EXAM: CHEST - 2 VIEW COMPARISON:  08/13/2021 FINDINGS: Low volume film. Basilar atelectasis noted with tiny left pleural effusion. The cardiopericardial silhouette is within normal limits for size. Right Port-A-Cath again noted. Bones are diffusely demineralized. IMPRESSION: Low volume film with basilar atelectasis and tiny left pleural effusion. Electronically Signed   By: Misty Stanley M.D.   On: 05/24/2022 11:58   CT ABDOMEN PELVIS W CONTRAST  Result Date: 05/23/2022 CLINICAL DATA:  Abdominal pain, acute, nonlocalized EXAM: CT ABDOMEN AND PELVIS WITH CONTRAST TECHNIQUE:  Multidetector CT imaging of the abdomen and pelvis was performed using the standard protocol following  bolus administration of intravenous contrast. RADIATION DOSE REDUCTION: This exam was performed according to the departmental dose-optimization program which includes automated exposure control, adjustment of the mA and/or kV according to patient size and/or use of iterative reconstruction technique. CONTRAST:  171m OMNIPAQUE IOHEXOL 300 MG/ML  SOLN COMPARISON:  04/21/2022 FINDINGS: Lower chest: No acute abnormality Hepatobiliary: No focal hepatic abnormality. Gallbladder unremarkable. Pancreas: No focal abnormality or ductal dilatation. Spleen: No focal abnormality.  Normal size. Adrenals/Urinary Tract: Bilateral ureteral stents are in place, as seen on prior study. Moderate left hydronephrosis is stable. No hydronephrosis on the right. Scarring and cortical thinning in the upper pole of the left kidney. No adrenal mass. Urinary bladder unremarkable. Stomach/Bowel: Again noted is the area of circumferential wall thickening in the distal ascending colon near the hepatic flexure as seen on prior imaging. This appears to have worsened since prior study. Now, there is significant distention of the cecum and ascending colon proximal to this lesion, and small bowel loops are dilated and fluid-filled. Findings are suspicious for obstructing colon cancer. Vascular/Lymphatic: Aortic atherosclerosis. No evidence of aneurysm or adenopathy. Small scattered retroperitoneal lymph nodes again noted, slightly improved since prior study. Reproductive: Uterus and adnexa unremarkable.  No mass. Other: Mild ascites, best seen adjacent to the liver and in the right paracolic gutter. Musculoskeletal: No acute bony abnormality. IMPRESSION: Worsening circumferential irregular wall thickening in the distal ascending colon near the hepatic flexure which is now causing obstruction with dilated cecum and ascending colon proximal to this  lesion as well as dilated fluid-filled small bowel loops. 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 Bilateral ureteral stents remain in place with moderate left hydronephrosis, unchanged since prior study. Improving right hydronephrosis. Mild ascites. Electronically Signed   By: KRolm BaptiseM.D.   On: 05/23/2022 23:43    Labs: BNP (last 3 results) No results for input(s): "BNP" in the last 8760 hours. Basic Metabolic Panel: Recent Labs  Lab 05/23/22 2250 05/24/22 0659 05/25/22 0514 05/26/22 1114 05/27/22 0545  NA 134* 134* 138 136 139  K 4.1 3.9 3.7 3.9 3.7  CL 94* 92* 105 104 108  CO2 '28 30 26 25 24  '$ GLUCOSE 135* 121* 120* 106* 79  BUN '17 18 11 12 12  '$ CREATININE 1.41* 1.18*  1.32* 1.18* 1.05* 0.92  CALCIUM 8.7* 8.7* 8.3* 7.6* 8.0*  MG  --   --  2.3 1.8  --   PHOS  --   --  2.5 3.3  --    Liver Function Tests: Recent Labs  Lab 05/23/22 2250 05/24/22 0659  AST 17 16  ALT 9 10  ALKPHOS 80 80  BILITOT 0.6 0.7  PROT 6.9 6.7  ALBUMIN 3.4* 3.4*   Recent Labs  Lab 05/23/22 2250  LIPASE 19   No results for input(s): "AMMONIA" in the last 168 hours. CBC: Recent Labs  Lab 05/23/22 2250 05/24/22 0659 05/25/22 0514 05/26/22 1114 05/27/22 0545 05/28/22 0500 05/29/22 0500 05/29/22 2000 05/30/22 0500  WBC 6.4 6.0   < > 7.7 9.0 9.8 9.8  --  10.7*  NEUTROABS 4.6 3.9  --   --  6.3  --   --   --   --   HGB 9.8* 9.5*   < > 8.1* 7.9* 7.9* 7.0* 8.2* 8.2*  HCT 30.7* 29.1*   < > 25.6* 25.5* 25.4* 22.7* 25.6* 26.5*  MCV 109.3* 109.4*   < > 113.3* 113.3* 112.9* 112.9*  --  106.9*  PLT 228 214   < >  216 202 205 203  --  206   < > = values in this interval not displayed.   Cardiac Enzymes: No results for input(s): "CKTOTAL", "CKMB", "CKMBINDEX", "TROPONINI" in the last 168 hours. BNP: Invalid input(s): "POCBNP" CBG: Recent Labs  Lab 05/24/22 2122  GLUCAP 117*   D-Dimer No results for input(s): "DDIMER" in the last 72 hours. Hgb A1c No results for input(s):  "HGBA1C" in the last 72 hours. Lipid Profile No results for input(s): "CHOL", "HDL", "LDLCALC", "TRIG", "CHOLHDL", "LDLDIRECT" in the last 72 hours. Thyroid function studies No results for input(s): "TSH", "T4TOTAL", "T3FREE", "THYROIDAB" in the last 72 hours.  Invalid input(s): "FREET3" Anemia work up No results for input(s): "VITAMINB12", "FOLATE", "FERRITIN", "TIBC", "IRON", "RETICCTPCT" in the last 72 hours. Urinalysis    Component Value Date/Time   COLORURINE YELLOW 05/24/2022 0124   APPEARANCEUR CLEAR 05/24/2022 0124   LABSPEC 1.040 (H) 05/24/2022 0124   PHURINE 6.0 05/24/2022 0124   GLUCOSEU NEGATIVE 05/24/2022 0124   HGBUR SMALL (A) 05/24/2022 0124   BILIRUBINUR NEGATIVE 05/24/2022 0124   BILIRUBINUR small (A) 11/01/2021 0850   KETONESUR NEGATIVE 05/24/2022 0124   PROTEINUR NEGATIVE 05/24/2022 0124   UROBILINOGEN 2.0 (A) 11/01/2021 0850   NITRITE NEGATIVE 05/24/2022 0124   LEUKOCYTESUR LARGE (A) 05/24/2022 0124   Sepsis Labs Recent Labs  Lab 05/27/22 0545 05/28/22 0500 05/29/22 0500 05/30/22 0500  WBC 9.0 9.8 9.8 10.7*   Microbiology Recent Results (from the past 240 hour(s))  Urine Culture     Status: Abnormal   Collection Time: 05/24/22  1:24 AM   Specimen: Urine, Clean Catch  Result Value Ref Range Status   Specimen Description   Final    URINE, CLEAN CATCH Performed at [facility], [place] [address] [place], [place] [address]    Special Requests   Final    NONE Performed at [facility], [place] [address], [place], [place] [address]    Culture (A)  Final    <10,000 COLONIES/mL INSIGNIFICANT GROWTH Performed at [facility] [address] [place], [place] [address]    Report Status 05/25/2022 FINAL  Final     Time coordinating discharge: 25 minutes  SIGNED: Antonieta Pert, MD  [facility] 05/30/2022, 11:20 AM  If 7PM-7AM, please contact night-coverage [email or web address]

## 2022-05-30 NOTE — Progress Notes (Signed)
Patient discharged to home with husband. Discharge instructions reviewed with patient who verbalized understanding. No new medications.

## 2022-05-30 NOTE — Progress Notes (Signed)
[facility] floor coverage for both [facility] and [facility] (remote) on night of 05/29/22 into morning of 05/30/22:    I was notified by RN of the patient's systolic blood pressures in the 170s, asymptomatic with corresponding heart rates in the 70s.  Patient reportedly on amlodipine as an outpatient, but does not appear that this is included on her home medication list at present.  I have placed order for prn IV hydralazine.     Babs Bertin, DO Hospitalist

## 2022-05-30 NOTE — Progress Notes (Addendum)
Progress Note  5 Days Post-Op  Subjective: Pt tolerating soft diet and having bowel function. No bloody or black stools.  Objective: Vital signs in last 24 hours: Temp:  [97.6 F (36.4 C)-98.8 F (37.1 C)] 98.2 F (36.8 C) (06/17 0513) Pulse Rate:  [77-88] 77 (06/17 0513) Resp:  [16-18] 18 (06/17 0513) BP: (147-172)/(79-88) 172/88 (06/17 0513) SpO2:  [95 %-97 %] 97 % (06/17 0513) Weight:  [79.3 kg] 79.3 kg (06/17 0513) Last BM Date : 05/29/22  Intake/Output from previous day: 06/16 0701 - 06/17 0700 In: 330 [Blood:330] Out: -  Intake/Output this shift: No intake/output data recorded.  PE: General: pleasant, WD female who is laying in bed in NAD Heart: regular, rate, and rhythm.   Lungs: Respiratory effort nonlabored Abd: soft, appropriately ttp, incision with honeycomb present but staples visible, ND, +BS Psych: A&Ox3 with an appropriate affect.    Lab Results:  Recent Labs    05/29/22 0500 05/29/22 2000 05/30/22 0500  WBC 9.8  --  10.7*  HGB 7.0* 8.2* 8.2*  HCT 22.7* 25.6* 26.5*  PLT 203  --  206   BMET No results for input(s): "NA", "K", "CL", "CO2", "GLUCOSE", "BUN", "CREATININE", "CALCIUM" in the last 72 hours.  PT/INR No results for input(s): "LABPROT", "INR" in the last 72 hours. CMP     Component Value Date/Time   NA 139 05/27/2022 0545   NA 142 04/26/2018 1333   K 3.7 05/27/2022 0545   CL 108 05/27/2022 0545   CO2 24 05/27/2022 0545   GLUCOSE 79 05/27/2022 0545   BUN 12 05/27/2022 0545   BUN 22 04/26/2018 1333   CREATININE 0.92 05/27/2022 0545   CREATININE 1.31 (H) 05/22/2022 1255   CREATININE 1.32 (H) 06/04/2021 0000   CALCIUM 8.0 (L) 05/27/2022 0545   PROT 6.7 05/24/2022 0659   PROT 7.3 01/10/2018 1350   ALBUMIN 3.4 (L) 05/24/2022 0659   ALBUMIN 4.7 01/10/2018 1350   AST 16 05/24/2022 0659   AST 10 (L) 05/22/2022 1255   ALT 10 05/24/2022 0659   ALT 5 05/22/2022 1255   ALKPHOS 80 05/24/2022 0659   BILITOT 0.7 05/24/2022 0659    BILITOT 0.4 05/22/2022 1255   GFRNONAA >60 05/27/2022 0545   GFRNONAA 45 (L) 05/22/2022 1255   GFRNONAA 43 (L) 06/04/2021 0000   GFRAA 50 (L) 06/04/2021 0000   Lipase     Component Value Date/Time   LIPASE 19 05/23/2022 2250       Studies/Results: No results found.  Anti-infectives: Anti-infectives (From admission, onward)    Start     Dose/Rate Route Frequency Ordered Stop   05/28/22 2200  cephALEXin (KEFLEX) capsule 250 mg        250 mg Oral Daily at bedtime 05/28/22 1051     05/25/22 0600  cefoTEtan (CEFOTAN) 2 g in sodium chloride 0.9 % 100 mL IVPB        2 g 200 mL/hr over 30 Minutes Intravenous On call to O.R. 05/24/22 1456 05/25/22 1030   05/24/22 2200  neomycin (MYCIFRADIN) tablet 1,000 mg  Status:  Discontinued       See Hyperspace for full Linked Orders Report.   1,000 mg Oral 3 times per day 05/24/22 1456 05/24/22 1519   05/24/22 2200  metroNIDAZOLE (FLAGYL) tablet 1,000 mg  Status:  Discontinued       See Hyperspace for full Linked Orders Report.   1,000 mg Oral 3 times per day 05/24/22 1456 05/24/22 1519   05/24/22 1700  metroNIDAZOLE (FLAGYL) tablet 1,000 mg  Status:  Discontinued       See Hyperspace for full Linked Orders Report.   1,000 mg Oral 3 times per day on Sun 05/24/22 1519 05/25/22 1327   05/24/22 1700  neomycin (MYCIFRADIN) tablet 1,000 mg  Status:  Discontinued       See Hyperspace for full Linked Orders Report.   1,000 mg Oral 3 times per day on Sun 05/24/22 1519 05/25/22 1327   05/24/22 0500  cefTRIAXone (ROCEPHIN) 1 g in sodium chloride 0.9 % 100 mL IVPB  Status:  Discontinued        1 g 200 mL/hr over 30 Minutes Intravenous Every 24 hours 05/24/22 0406 05/28/22 1051        Assessment/Plan Metastatic adenocarcinoma of the bladder Colon mass - metastatic bladder cancer causing LBO POD5 s/p open right hemicolectomy/ partial omentectomy with primary ileocolic anastomosis 8/88/75 - Dr. Georgette Dover - surgical path significant for non-small cell  carcinoma with circumferential involvement and positive pericolic LN - tolerating soft diet and having bowel function  - continue to mobilize - Dispo as per primary, ok from our standpoint   FEN: soft diet, KVO IVF VTE: SQH ID: no current abx   - below per TRH -  Microcytic anemia - hgb stable now s/p 1U PRBC yesterday CKD stage IIIa Anxiety/Depression  GERD HTN HLD Hx of EtOH abuse   LOS: 6 days    Ileana Roup, [facility] 05/30/2022, 8:02 AM Please see Amion for pager number during day hours 7:00am-4:30pm

## 2022-06-01 ENCOUNTER — Other Ambulatory Visit (HOSPITAL_COMMUNITY): Payer: Self-pay

## 2022-06-04 ENCOUNTER — Other Ambulatory Visit (HOSPITAL_COMMUNITY): Payer: Self-pay

## 2022-06-04 ENCOUNTER — Inpatient Hospital Stay: Payer: No Typology Code available for payment source | Admitting: Physician Assistant

## 2022-06-04 MED ORDER — DOXYCYCLINE HYCLATE 100 MG PO TABS
100.0000 mg | ORAL_TABLET | Freq: Two times a day (BID) | ORAL | 0 refills | Status: DC
Start: 2022-06-04 — End: 2022-08-18
  Filled 2022-06-04: qty 14, 7d supply, fill #0

## 2022-06-05 ENCOUNTER — Other Ambulatory Visit: Payer: Self-pay | Admitting: Oncology

## 2022-06-09 ENCOUNTER — Other Ambulatory Visit (HOSPITAL_COMMUNITY): Payer: Self-pay

## 2022-06-09 MED FILL — Dexamethasone Sodium Phosphate Inj 100 MG/10ML: INTRAMUSCULAR | Qty: 1 | Status: AC

## 2022-06-10 ENCOUNTER — Inpatient Hospital Stay: Payer: No Typology Code available for payment source

## 2022-06-10 ENCOUNTER — Inpatient Hospital Stay (HOSPITAL_BASED_OUTPATIENT_CLINIC_OR_DEPARTMENT_OTHER): Payer: No Typology Code available for payment source | Admitting: Oncology

## 2022-06-10 ENCOUNTER — Inpatient Hospital Stay: Payer: No Typology Code available for payment source | Admitting: Dietician

## 2022-06-10 ENCOUNTER — Other Ambulatory Visit (HOSPITAL_COMMUNITY): Payer: Self-pay

## 2022-06-10 ENCOUNTER — Encounter: Payer: Self-pay | Admitting: Nurse Practitioner

## 2022-06-10 ENCOUNTER — Encounter: Payer: Self-pay | Admitting: Physician Assistant

## 2022-06-10 ENCOUNTER — Inpatient Hospital Stay (HOSPITAL_BASED_OUTPATIENT_CLINIC_OR_DEPARTMENT_OTHER): Payer: No Typology Code available for payment source | Admitting: Nurse Practitioner

## 2022-06-10 ENCOUNTER — Other Ambulatory Visit: Payer: Self-pay

## 2022-06-10 ENCOUNTER — Encounter: Payer: No Typology Code available for payment source | Admitting: Dietician

## 2022-06-10 ENCOUNTER — Inpatient Hospital Stay: Payer: No Typology Code available for payment source | Admitting: Physician Assistant

## 2022-06-10 VITALS — BP 107/71 | HR 72 | Temp 97.7°F | Resp 17 | Ht 66.0 in | Wt 153.7 lb

## 2022-06-10 VITALS — BP 119/81 | HR 80 | Temp 98.1°F | Resp 18

## 2022-06-10 DIAGNOSIS — R530 Neoplastic (malignant) related fatigue: Secondary | ICD-10-CM

## 2022-06-10 DIAGNOSIS — F17290 Nicotine dependence, other tobacco product, uncomplicated: Secondary | ICD-10-CM | POA: Diagnosis not present

## 2022-06-10 DIAGNOSIS — C801 Malignant (primary) neoplasm, unspecified: Secondary | ICD-10-CM

## 2022-06-10 DIAGNOSIS — Z515 Encounter for palliative care: Secondary | ICD-10-CM | POA: Diagnosis not present

## 2022-06-10 DIAGNOSIS — C772 Secondary and unspecified malignant neoplasm of intra-abdominal lymph nodes: Secondary | ICD-10-CM | POA: Diagnosis not present

## 2022-06-10 DIAGNOSIS — D630 Anemia in neoplastic disease: Secondary | ICD-10-CM | POA: Diagnosis not present

## 2022-06-10 DIAGNOSIS — C669 Malignant neoplasm of unspecified ureter: Secondary | ICD-10-CM | POA: Diagnosis present

## 2022-06-10 DIAGNOSIS — Z5111 Encounter for antineoplastic chemotherapy: Secondary | ICD-10-CM | POA: Diagnosis present

## 2022-06-10 DIAGNOSIS — R109 Unspecified abdominal pain: Secondary | ICD-10-CM

## 2022-06-10 DIAGNOSIS — Z79899 Other long term (current) drug therapy: Secondary | ICD-10-CM | POA: Diagnosis not present

## 2022-06-10 DIAGNOSIS — Z95828 Presence of other vascular implants and grafts: Secondary | ICD-10-CM

## 2022-06-10 DIAGNOSIS — Z5189 Encounter for other specified aftercare: Secondary | ICD-10-CM | POA: Diagnosis not present

## 2022-06-10 DIAGNOSIS — R630 Anorexia: Secondary | ICD-10-CM

## 2022-06-10 DIAGNOSIS — E875 Hyperkalemia: Secondary | ICD-10-CM | POA: Diagnosis not present

## 2022-06-10 DIAGNOSIS — G893 Neoplasm related pain (acute) (chronic): Secondary | ICD-10-CM

## 2022-06-10 DIAGNOSIS — E860 Dehydration: Secondary | ICD-10-CM | POA: Diagnosis not present

## 2022-06-10 DIAGNOSIS — C679 Malignant neoplasm of bladder, unspecified: Secondary | ICD-10-CM

## 2022-06-10 DIAGNOSIS — C779 Secondary and unspecified malignant neoplasm of lymph node, unspecified: Secondary | ICD-10-CM

## 2022-06-10 DIAGNOSIS — Z7189 Other specified counseling: Secondary | ICD-10-CM

## 2022-06-10 DIAGNOSIS — R634 Abnormal weight loss: Secondary | ICD-10-CM

## 2022-06-10 DIAGNOSIS — R143 Flatulence: Secondary | ICD-10-CM | POA: Diagnosis not present

## 2022-06-10 LAB — CMP (CANCER CENTER ONLY)
ALT: 5 U/L (ref 0–44)
AST: 10 U/L — ABNORMAL LOW (ref 15–41)
Albumin: 3.4 g/dL — ABNORMAL LOW (ref 3.5–5.0)
Alkaline Phosphatase: 69 U/L (ref 38–126)
Anion gap: 11 (ref 5–15)
BUN: 18 mg/dL (ref 8–23)
CO2: 25 mmol/L (ref 22–32)
Calcium: 9 mg/dL (ref 8.9–10.3)
Chloride: 103 mmol/L (ref 98–111)
Creatinine: 1.03 mg/dL — ABNORMAL HIGH (ref 0.44–1.00)
GFR, Estimated: >60 mL/min (ref >60)
Glucose, Bld: 95 mg/dL (ref 70–99)
Potassium: 3.9 mmol/L (ref 3.5–5.1)
Sodium: 139 mmol/L (ref 135–145)
Total Bilirubin: 0.3 mg/dL (ref 0.3–1.2)
Total Protein: 7.3 g/dL (ref 6.5–8.1)

## 2022-06-10 LAB — CBC WITH DIFFERENTIAL (CANCER CENTER ONLY)
Abs Immature Granulocytes: 0.17 10*3/uL — ABNORMAL HIGH (ref 0.00–0.07)
Basophils Absolute: 0.1 10*3/uL (ref 0.0–0.1)
Basophils Relative: 1 %
Eosinophils Absolute: 0.1 10*3/uL (ref 0.0–0.5)
Eosinophils Relative: 1 %
HCT: 27.5 % — ABNORMAL LOW (ref 36.0–46.0)
Hemoglobin: 8.8 g/dL — ABNORMAL LOW (ref 12.0–15.0)
Immature Granulocytes: 2 %
Lymphocytes Relative: 20 %
Lymphs Abs: 1.7 10*3/uL (ref 0.7–4.0)
MCH: 33.1 pg (ref 26.0–34.0)
MCHC: 32 g/dL (ref 30.0–36.0)
MCV: 103.4 fL — ABNORMAL HIGH (ref 80.0–100.0)
Monocytes Absolute: 0.8 10*3/uL (ref 0.1–1.0)
Monocytes Relative: 9 %
Neutro Abs: 5.8 10*3/uL (ref 1.7–7.7)
Neutrophils Relative %: 67 %
Platelet Count: 321 10*3/uL (ref 150–400)
RBC: 2.66 MIL/uL — ABNORMAL LOW (ref 3.87–5.11)
RDW: 17.2 % — ABNORMAL HIGH (ref 11.5–15.5)
WBC Count: 8.6 10*3/uL (ref 4.0–10.5)
nRBC: 0 % (ref 0.0–0.2)

## 2022-06-10 LAB — SAMPLE TO BLOOD BANK

## 2022-06-10 MED ORDER — SODIUM CHLORIDE 0.9 % IV SOLN
175.0000 mg/m2 | Freq: Once | INTRAVENOUS | Status: AC
  Administered 2022-06-10: 336 mg via INTRAVENOUS
  Filled 2022-06-10: qty 56

## 2022-06-10 MED ORDER — SODIUM CHLORIDE 0.9% FLUSH
10.0000 mL | Freq: Once | INTRAVENOUS | Status: AC
  Administered 2022-06-10: 10 mL

## 2022-06-10 MED ORDER — HEPARIN SOD (PORK) LOCK FLUSH 100 UNIT/ML IV SOLN
500.0000 [IU] | Freq: Once | INTRAVENOUS | Status: AC | PRN
  Administered 2022-06-10: 500 [IU]

## 2022-06-10 MED ORDER — DIPHENHYDRAMINE HCL 50 MG/ML IJ SOLN
50.0000 mg | Freq: Once | INTRAMUSCULAR | Status: AC
  Administered 2022-06-10: 50 mg via INTRAVENOUS
  Filled 2022-06-10: qty 1

## 2022-06-10 MED ORDER — SODIUM CHLORIDE 0.9 % IV SOLN
10.0000 mg | Freq: Once | INTRAVENOUS | Status: AC
  Administered 2022-06-10: 10 mg via INTRAVENOUS
  Filled 2022-06-10: qty 10

## 2022-06-10 MED ORDER — SODIUM CHLORIDE 0.9% FLUSH
10.0000 mL | INTRAVENOUS | Status: DC | PRN
  Administered 2022-06-10: 10 mL

## 2022-06-10 MED ORDER — SODIUM CHLORIDE 0.9 % IV SOLN: Freq: Once | INTRAVENOUS | Status: AC

## 2022-06-10 MED ORDER — FAMOTIDINE IN NACL 20-0.9 MG/50ML-% IV SOLN
20.0000 mg | Freq: Once | INTRAVENOUS | Status: AC
  Administered 2022-06-10: 20 mg via INTRAVENOUS
  Filled 2022-06-10: qty 50

## 2022-06-10 MED ORDER — TAMSULOSIN HCL 0.4 MG PO CAPS
ORAL_CAPSULE | Freq: Every day | ORAL | 3 refills | Status: DC
  Filled 2022-06-10: qty 30, 30d supply, fill #0
  Filled 2022-07-10: qty 30, 30d supply, fill #1
  Filled 2022-08-12: qty 30, 30d supply, fill #2
  Filled 2022-09-17: qty 30, 30d supply, fill #3

## 2022-06-10 NOTE — Progress Notes (Unsigned)
Nutrition Assessment   Reason for Assessment: weight loss, poor appetite   ASSESSMENT: 66 year old female with metastatic bladder cancer (10/2020) s/p chemotherapy, radiation to right psoas metastatic lesion (02/2022), recent hospitalization s/p right hemicolectomy secondary to recurrent tumor.   Past medical history includes anxiety, arthritis, CKD III, COVID infection, GERD, acute colitis, h/o rheumatic fever, HTN, MDD, HLD, vit D deficiency.  Met with patient in infusion. Patient reports she has no appetite. She wants to spit food out as soon as it goes in her mouth. Patient reports drinking 2 Premier Protein on most days. She reports Dr. Alen Blew has advised her to restart appetite stimulant (megace). Per patient, she is also going to start steroid taper. Patient states family "is going to have to put a lock on the pantry."      Nutrition Focused Physical Exam: deferred     Medications: norco, gabapentin, klonopin, keflex, dulcolax, prilosec, zofran, zoloft,    Labs: Cr 1.03   Anthropometrics: Weights have decreased 21 lbs (12%) since 6/17 hospital discharge. This is severe.   Height: 5'6" Weight: 153 lb 11.2 oz UBW: 170-175 lb (per pt) BMI: 24.81  6/17 - 174 lb 13.2 oz 6/10 - 170 lb  5/16 - 173 lb 4.8 oz  4/13 - 176 lb 8 oz    NUTRITION DIAGNOSIS: Unintentional weight loss related to cancer as evidenced by reported poor appetite, 12% weight loss in 11 days s/p hospital admission for SBO, right hemicolectomy related to recurrent tumor   INTERVENTION:  Educated on small frequent meals and snacks with adequate calories and protein vs 3 larger meals daily- handout with ideas provided Discussed foods with protein and its importance for healing - handout with food list provided (pt to put this on her fridge) Continue drinking 2 oral supplements daily and recommend switching to Ensure Plus/equivalent to added calories - samples provided. Patient agreeable to drinking chocolate  Ensure during infusion  Take appetite stimulant per MD Contact information given   MONITORING, EVALUATION, GOAL: Patient will tolerate increased calories and protein to minimize further weight loss   Next Visit: To be scheduled with treatment plan

## 2022-06-10 NOTE — Patient Instructions (Signed)
[facility]   Discharge Instructions: Thank you for choosing [facility] to provide your oncology and hematology care.   If you have a lab appointment with the [facility], please go directly to the [facility] and check in at the registration area.   Wear comfortable clothing and clothing appropriate for easy access to any Portacath or PICC line.   We strive to give you quality time with your provider. You may need to reschedule your appointment if you arrive late (15 or more minutes).  Arriving late affects you and other patients whose appointments are after yours.  Also, if you miss three or more appointments without notifying the office, you may be dismissed from the clinic at the provider's discretion.      For prescription refill requests, have your pharmacy contact our office and allow 72 hours for refills to be completed.    Today you received the following chemotherapy and/or immunotherapy agents: paclitaxel      To help prevent nausea and vomiting after your treatment, we encourage you to take your nausea medication as directed.  BELOW ARE SYMPTOMS THAT SHOULD BE REPORTED IMMEDIATELY: *FEVER GREATER THAN 100.4 F (38 C) OR HIGHER *CHILLS OR SWEATING *NAUSEA AND VOMITING THAT IS NOT CONTROLLED WITH YOUR NAUSEA MEDICATION *UNUSUAL SHORTNESS OF BREATH *UNUSUAL BRUISING OR BLEEDING *URINARY PROBLEMS (pain or burning when urinating, or frequent urination) *BOWEL PROBLEMS (unusual diarrhea, constipation, pain near the anus) TENDERNESS IN MOUTH AND THROAT WITH OR WITHOUT PRESENCE OF ULCERS (sore throat, sores in mouth, or a toothache) UNUSUAL RASH, SWELLING OR PAIN  UNUSUAL VAGINAL DISCHARGE OR ITCHING   Items with * indicate a potential emergency and should be followed up as soon as possible or go to the Emergency Department if any problems should occur.  Please show the CHEMOTHERAPY ALERT CARD or IMMUNOTHERAPY ALERT CARD at check-in  to the Emergency Department and triage nurse.  Should you have questions after your visit or need to cancel or reschedule your appointment, please contact [facility]  Dept: [phone]  and follow the prompts.  Office hours are 8:00 a.m. to 4:30 p.m. Monday - Friday. Please note that voicemails left after 4:00 p.m. may not be returned until the following business day.  We are closed weekends and major holidays. You have access to a nurse at all times for urgent questions. Please call the main number to the clinic Dept: [phone] and follow the prompts.   For any non-urgent questions, you may also contact your provider using MyChart. We now offer e-Visits for anyone 50 and older to request care online for non-urgent symptoms. For details visit [email or web address].   Also download the MyChart app! Go to the app store, search "MyChart", open the app, select [facility], and log in with your MyChart username and password.  Masks are optional in the cancer centers. If you would like for your care team to wear a mask while they are taking care of you, please let them know. For doctor visits, patients may have with them one support person who is at least 65 years old. At this time, visitors are not allowed in the infusion area.

## 2022-06-10 NOTE — Progress Notes (Signed)
Called to infusion center for concern of confusion. Patient approximately 2 hours into Taxol infusion that she had been tolerating well. She had been conversing with the RN without difficulty then he noticed she became confused with the items around her not knowing what they were. RN also noted that she seemed unsteady. Glucose checked and was 130. She was hemodynamically stable. At the time of my exam patient had a normal neuro exam. She was alert and oriented, conversation was appropriate. Oncologist Dr. Alen Blew made aware and recommends no intervention necessary at this time. Patient tolerated remainder of treatment. I had lengthy discussion with patient about concerning symptoms. She feels she can manage at home with assistance of spouse and daughter if needed. Discussed ED precautions should symptoms develop or worsen.

## 2022-06-10 NOTE — Progress Notes (Signed)
Denies anyHematology and Oncology Follow Up Visit  Mary Stark [id number] [birth year] 66 y.o. 06/10/2022 8:15 AM [name], NP   [name]: 66 year old woman with bladder cancer diagnosed in November 2021.  She was found to have stage IV adenocarcinoma with pelvic adenopathy.   Prior Therapy:  She is status post retroperitoneal lymph node biopsy completed on 10/24/2020 which showed metastatic carcinoma.  She is status post repeat cystoscopy and bilateral ureteral stent exchange as well as resection of a bladder tumor on January 21, 2021.  A final pathology showed an adenocarcinoma.  Chemotherapy utilizing carboplatin and gemcitabine started on November 22, 2020.  She completed 6 cycles of therapy on March 06, 2021.  FOLFOX chemotherapy started on Apr 15, 2021.  She completed 12 cycles of therapy.  She is status post radiation therapy to a right psoas metastatic lesion. She will complete 50 Gray in 5 fractions on February 27, 2022.  5-FU and leucovorin chemotherapy every 2 weeks started in November 2022.  She received maintenance therapy till June 2023 due to progression of disease.  She is status post right hemicolectomy completed on July 25, 2022.  The final pathology carcinoma that is a similar to her GU primary.  Current therapy: Under evaluation start salvage therapy.  Interim History: Mary Stark is here for a follow-up visit.  Since the last visit, she underwent right hemicolectomy for recurrent tumor arising from her bladder primary.  She tolerated the procedure well although she is still recovering slowly.  She denies any nausea, vomiting or abdominal pain.  She has reported poor p.o. intake and lost close to 20 pounds.  Her appetite has been poor.  She denies any constipation or diarrhea.  She does report loose bowel habits at times.  Denies any increased pain or discomfort.    Medications: Reviewed without changes. Current Outpatient Medications   Medication Sig Dispense Refill   albuterol (PROAIR HFA) 108 (90 Base) MCG/ACT inhaler INHALE 1 TO 2 PUFFS BY MOUTH INTO THE LUNGS EVERY 4 HOURS AS NEEDED FOR WHEEZING OR SHORTNESS OF BREATH 8 g 1   ARIPiprazole (ABILIFY) 10 MG tablet TAKE 1 TABLET BY MOUTH ONCE DAILY (Patient taking differently: Take 10 mg by mouth at bedtime.) 90 tablet 0   azelastine (ASTELIN) 0.1 % nasal spray Place 2 sprays into both nostrils 2 (two) times daily. Use in each nostril as directed (Patient taking differently: Place 2 sprays into both nostrils as needed for allergies. Use in each nostril as directed) 301 mL 1   bisacodyl (DULCOLAX) 5 MG EC tablet Take 2 tablets by mouth twice a day. (Patient taking differently: Take 10 mg by mouth 2 (two) times daily.) 4 tablet 0   bisacodyl (DULCOLAX) 5 MG EC tablet Take 2 tablets (10 mg total) by mouth 2 (two) times daily. 4 tablet 0   buPROPion (WELLBUTRIN XL) 300 MG 24 hr tablet TAKE 1 TABLET BY MOUTH EVERY DAY 90 tablet 1   cephALEXin (KEFLEX) 250 MG capsule Take 1 capsule by mouth nightly as directed (Patient taking differently: Take 250 mg by mouth at bedtime. Continuous therapy due to 2 urinary stents) 90 capsule 1   clonazePAM (KLONOPIN) 1 MG tablet Take 1 tablet (1 mg total) by mouth at bedtime. 30 tablet 2   cycloSPORINE (RESTASIS) 0.05 % ophthalmic emulsion Place 1 drop both eyes twice a day (Patient taking differently: Place 1 drop into both eyes daily.) 180 each 3   doxycycline (VIBRA-TABS) 100 MG tablet Take 1  tablet (100 mg total) by mouth 2 (two) times daily for 7 days 14 tablet 0   gabapentin (NEURONTIN) 300 MG capsule Take 1 capsule by mouth daily and 4 capsules at bedtime as directed. (Patient taking differently: Take 300-1,200 mg by mouth See admin instructions. Take 1 capsule by mouth in the morning then take 3 capsules at night) 450 capsule 1   HYDROcodone-acetaminophen (NORCO) 5-325 MG tablet Take 1 tablet by mouth every 6 (six) hours as needed for severe pain.  30 tablet 0   lidocaine-prilocaine (EMLA) cream Apply 1 application topically as needed. (Patient taking differently: Apply 1 application  topically as needed (for port access).) 30 g 0   Meth-Hyo-M Bl-Na Phos-Ph Sal (URIBEL) 118 MG CAPS Take one capsule by mouth 3 times daily (Patient not taking: Reported on 05/23/2022) 30 capsule 3   mirabegron ER (MYRBETRIQ) 25 MG TB24 tablet TAKE 1 TABLET BY MOUTH ONCE A DAY (Patient taking differently: Take 25 mg by mouth daily.) 30 tablet 3   Nebivolol HCl 20 MG TABS Take 1/2 tablet (10 mg total) by mouth daily. (Patient not taking: Reported on 05/23/2022) 90 tablet 1   omeprazole (PRILOSEC) 20 MG capsule Take 1 capsule (20 mg total) by mouth daily. 90 capsule 3   ondansetron (ZOFRAN) 4 MG tablet Take 1 tablet (4 mg total) by mouth every 8 (eight) hours as needed for nausea or vomiting. 40 tablet 3   oxymetazoline (AFRIN) 0.05 % nasal spray Place 1 spray into both nostrils 2 (two) times daily as needed for congestion. For nose bleeds     polyethylene glycol-electrolytes (NULYTELY) 420 g solution Use as directed 4000 mL 0   polyethylene glycol-electrolytes (NULYTELY) 420 g solution Take as directed over 2 days. 4000 mL 0   sertraline (ZOLOFT) 25 MG tablet Take 1 tablet (25 mg total) by mouth daily. 90 tablet 1   simvastatin (ZOCOR) 20 MG tablet TAKE 1 TABLET (20 MG TOTAL) BY MOUTH DAILY. (Patient taking differently: Take 20 mg by mouth at bedtime.) 90 tablet 3   tamsulosin (FLOMAX) 0.4 MG CAPS capsule TAKE 1 CAPSULE BY MOUTH AT BEDTIME 30 capsule 3   No current facility-administered medications for this visit.     Allergies:  Allergies  Allergen Reactions   Metformin And Related Other (See Comments)    Lactic acidosis    Penicillins Hives and Rash    Reports hives to penicillin and amoxicillin as an adult "a long time ago." Has tolerated cefdinir (09/2020) and cefepime (10/2020) before.   Metformin Diarrhea    Extreme diarrhea and lactic acidosis    Penicillin G Sodium Hives and Rash      Physical Exam:          Blood pressure 107/71, pulse 72, temperature 97.7 F (36.5 C), temperature source Tympanic, resp. rate 17, height '5\' 6"'$  (1.676 m), weight 153 lb 11.2 oz (69.7 kg), SpO2 99 %.       ECOG: 1    General appearance: Alert, awake without any distress. Head: Atraumatic without abnormalities Oropharynx: Without any thrush or ulcers. Eyes: No scleral icterus. Lymph nodes: No lymphadenopathy noted in the cervical, supraclavicular, or axillary nodes Heart:regular rate and rhythm, without any murmurs or gallops.   Lung: Clear to auscultation without any rhonchi, wheezes or dullness to percussion. Abdomin: Soft, nontender without any shifting dullness or ascites. Musculoskeletal: No clubbing or cyanosis. Neurological: No motor or sensory deficits. Skin: No rashes or lesions.  Lab Results: Lab Results  Component Value Date   WBC 10.7 (H) 05/30/2022   HGB 8.2 (L) 05/30/2022   HCT 26.5 (L) 05/30/2022   MCV 106.9 (H) 05/30/2022   PLT 206 05/30/2022     Chemistry      Component Value Date/Time   NA 139 05/27/2022 0545   NA 142 04/26/2018 1333   K 3.7 05/27/2022 0545   CL 108 05/27/2022 0545   CO2 24 05/27/2022 0545   BUN 12 05/27/2022 0545   BUN 22 04/26/2018 1333   CREATININE 0.92 05/27/2022 0545   CREATININE 1.31 (H) 05/22/2022 1255   CREATININE 1.32 (H) 06/04/2021 0000   GLU 87 02/07/2013 0000      Component Value Date/Time   CALCIUM 8.0 (L) 05/27/2022 0545   ALKPHOS 80 05/24/2022 0659   AST 16 05/24/2022 0659   AST 10 (L) 05/22/2022 1255   ALT 10 05/24/2022 0659   ALT 5 05/22/2022 1255   BILITOT 0.7 05/24/2022 0659   BILITOT 0.4 05/22/2022 1255        Impression and Plan:    66 year old woman with:   1.   Stage IV adenocarcinoma of the bladder diagnosed December 2021.  She developed relapsed disease with colonic involvement in June  2023.    Her disease status was updated at this time and treatment choices were reviewed.  She has progressed on therapy outlined above and requires different salvage therapy.  She has received platinum based therapy previously with modest response and progressed recently on oxaliplatin.  Regimen utilizing taxane and platinum would be too toxic given her platinum resistant disease.  Single agent Taxol could be considered at this time.  Complications include nausea, vomiting, myelosuppression, neutropenia and possible sepsis.  We will repeat foundation testing in the current specimen for possible actionable mutation.  After discussion today she is agreeable to proceed.   2.  IV access: Port-A-Cath remains in use at this time.  3.  Antiemetics: No nausea or vomiting reported at this time.  Compazine is available.   4.  Renal function surveillance: Kidney function remains normal at this time.  We will continue to monitor.   5.  Goals of care: Therapy remains palliative although aggressive measures are warranted given her excellent performance status.   6.  Anemia: Related to malignancy and recent surgery.  Her hemoglobin is adequate.  7.  Colon mass: Metastatic disease has been documented that she is status post hemicolectomy.  8.  Growth factor support: We will add Fulphila given her risk of neutropenia heavily pretreated.   9.  Follow-up: In 3 weeks for repeat follow-up.   30  minutes were dedicated to this encounter.  Time spent on reviewing pathology results, discussing treatment choices and addressing complications related to therapy.  He is  Zola Button, MD 6/28/20238:15 AM

## 2022-06-10 NOTE — Progress Notes (Signed)
Patient declined to use cryotherapy. Educated on benefits of cryotherapy regarding peripheral neuropathy, pt still declined.  1430- Patient seemed confused about what was going on around her. She was alert and oriented to herself, place, and time but was very confused about what items were around her and was very unsteady, even in the chair. She seemed as if she might fall forward out of the chair. RN called for assessment by Lisabeth Devoid PA-C. FSBS 130. Vitals located in flowsheet. Patient returned to normal after around 10 minutes.   1505-Patient soiled her brief and was assisted to restroom by 2 Rns. Personal hygiene care was assisted and new brief supplied. Patient reports this is not unusual for her.

## 2022-06-10 NOTE — Progress Notes (Signed)
Mary Stark  [phone] [phone]   Name: Mary Stark Date: 06/10/2022 MRN: [id number]  DOB: September 16, 1956  Patient Care Team: [name], NP as PCP - General (Nurse Practitioner) Norma Fredrickson, MD as Consulting Physician (Psychiatry) Gregor Hams, MD as Consulting Physician (Sports Medicine) Wyatt Portela, MD as Consulting Physician (Oncology) Arta Silence, MD as Consulting Physician (Gastroenterology)    REASON FOR CONSULTATION: Mary Stark is a 66 y.o. female with medical history including metastatic bladder cancer (10/2020) s/p chemotherapy, radiation to right psoas metastatic lesion (02/2022), recent hospitalization s/p right hemicolectomy secondary to recurrent tumor.  Palliative ask to see for symptom management and goals of care.    SOCIAL HISTORY:     reports that she quit smoking about 11 years ago. Her smoking use included cigarettes. She has a 25.00 pack-year smoking history. She has never used smokeless tobacco. She reports current alcohol use. She reports that she does not use drugs.  ADVANCE DIRECTIVES:    CODE STATUS:   PAST MEDICAL HISTORY: Past Medical History:  Diagnosis Date   Adenocarcinoma of bladder, stage 4 ([place]) 10/2020   oncologist--- dr Alen Blew and urologist-- dr pace;  w/ mets , started chemo 12/ 2021 and bilateral ureteral stents to treat hydronephrosis   Alcohol dependence ([place])    drank heavily years ago doesn't now 10/22/2021   Anemia due to chemotherapy    Anxiety    Arthritis    Back and lt knee   CKD (chronic kidney disease), stage III [facility])    COVID    August 2021, was in hospital and was on Oxygen for 11 weeks lungs still look like glass. 10/22/2021   Family history of adverse reaction to anesthesia    father had a "tight" airway   GERD (gastroesophageal reflux disease)    History of 2019 novel coronavirus disease (COVID-19) 08/13/2020   positive result in epic  08-19-2020,  hospital admission in epic,  covid pneumonia with hypoxia respiratory failure, discharged with oxygen for 6 weeks   History of chemotherapy last done 05-13-2021   History of colitis 03/2018   acute colitis with sepsis   History of hepatitis C    TX FOR 2012   History of kidney stones    History of rheumatic fever as a child    per pt no valvular issues   History of sepsis 10/2020   hospital admission in epic due to pyelonephritis due to hydronephrosis, resolved   History of skin cancer    excision from nose   Hyperlipidemia    Hypertension    MDD (major depressive disorder)    Pneumonia    august 2022   Seasonal allergies    Sepsis ([place])    uro sepsis June 2022   Vitamin D deficiency     PAST SURGICAL HISTORY:  Past Surgical History:  Procedure Laterality Date   ANAL RECTAL MANOMETRY N/A 01/31/2020   Procedure: ANO RECTAL MANOMETRY;  Surgeon: Arta Silence, MD;  Location: [facility];  Service: Endoscopy;  Laterality: N/A;   CESAREAN SECTION  1984   CYSTOSCOPY W/ URETERAL STENT PLACEMENT Bilateral 01/21/2021   Procedure: CYSTOSCOPY WITH STENT REPLACEMENT;  Surgeon: Robley Fries, MD;  Location: [facility];  Service: Urology;  Laterality: Bilateral;  1 HR   CYSTOSCOPY W/ URETERAL STENT PLACEMENT Bilateral 05/20/2021   Procedure: CYSTOSCOPY WITH RETROGRADE PYELOGRAM/URETERAL STENT EXCHANGE;  Surgeon: Robley Fries, MD;  Location: [facility];  Service: Urology;  Laterality: Bilateral;   CYSTOSCOPY W/ URETERAL STENT PLACEMENT Bilateral 10/28/2021   Procedure: CYSTOSCOPY WITH RETROGRADE PYELOGRAM/URETERAL STENT EXCHANGE;  Surgeon: Robley Fries, MD;  Location: [facility];  Service: Urology;  Laterality: Bilateral;   CYSTOSCOPY W/ URETERAL STENT PLACEMENT Bilateral 04/28/2022   Procedure: CYSTOSCOPY Wyvonnia Dusky STENT EXCHANGE;  Surgeon: Robley Fries, MD;  Location: [facility];  Service: Urology;   Laterality: Bilateral;  1 HR   CYSTOSCOPY WITH RETROGRADE PYELOGRAM, URETEROSCOPY AND STENT PLACEMENT Bilateral 06/11/2020   Procedure: CYSTOSCOPY WITH RETROGRADE PYELOGRAM, URETEROSCOPY AND STENT PLACEMENT, RIGHT URETERAL BIOPSY;  Surgeon: Robley Fries, MD;  Location: [facility];  Service: Urology;  Laterality: Bilateral;  14 MINS   CYSTOSCOPY WITH RETROGRADE PYELOGRAM, URETEROSCOPY AND STENT PLACEMENT Bilateral 07/05/2020   Procedure: CYSTOSCOPY WITH RETROGRADE PYELOGRAM, URETEROSCOPY,  BIOPSIES AND STENT EXCHANGES;  Surgeon: Robley Fries, MD;  Location: [facility];  Service: Urology;  Laterality: Bilateral;   FINGER SURGERY Right    5th   GYNECOLOGIC CRYOSURGERY  YRS AGO   IR IMAGING GUIDED PORT INSERTION  11/18/2020   PARTIAL COLECTOMY Right 05/25/2022   Procedure: EXPLORATORY LAPAROTOMY; RIGHT HEMI COLECTOMY; PARTIAL OMENTECTOMY;  Surgeon: Donnie Mesa, MD;  Location: [facility];  Service: General;  Laterality: Right;   TONSILLECTOMY  AS CHILD   TRANSURETHRAL RESECTION OF BLADDER TUMOR  01/21/2021   Procedure: TRANSURETHRAL RESECTION OF BLADDER TUMOR (TURBT);  Surgeon: Robley Fries, MD;  Location: [facility];  Service: Urology;;    HEMATOLOGY/ONCOLOGY HISTORY:  Oncology History  Malignancy ([place])  10/31/2020 Initial Diagnosis   Malignancy ([place])   11/22/2020 - 03/06/2021 Chemotherapy         04/15/2021 - 05/08/2022 Chemotherapy   Patient is on Treatment Plan : COLORECTAL FOLFOX q14d     06/10/2022 -  Chemotherapy   Patient is on Treatment Plan : BLADDER Paclitaxel D1 + Gemcitabine D1,8,15 q21d x 6 cycles     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[place])  11/06/2020 Initial Diagnosis   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  MEDICATIONS:  Current Outpatient Medications  Medication Sig Dispense Refill   albuterol (PROAIR HFA) 108 (90 Base) MCG/ACT inhaler INHALE 1 TO 2 PUFFS BY MOUTH INTO THE LUNGS EVERY 4 HOURS AS NEEDED FOR WHEEZING OR SHORTNESS OF BREATH 8 g 1   ARIPiprazole (ABILIFY) 10 MG tablet TAKE 1 TABLET BY MOUTH ONCE DAILY (Patient taking differently: Take 10 mg by mouth at bedtime.) 90 tablet 0   azelastine (ASTELIN) 0.1 % nasal spray Place 2 sprays into both nostrils 2 (two) times daily. Use in each nostril as directed (Patient taking differently: Place 2 sprays into both nostrils as needed for allergies. Use in each nostril as directed) 301 mL 1   bisacodyl (DULCOLAX) 5 MG EC tablet Take 2 tablets by mouth twice a day. (  Patient taking differently: Take 10 mg by mouth 2 (two) times daily.) 4 tablet 0   bisacodyl (DULCOLAX) 5 MG EC tablet Take 2 tablets (10 mg total) by mouth 2 (two) times daily. 4 tablet 0   buPROPion (WELLBUTRIN XL) 300 MG 24 hr tablet TAKE 1 TABLET BY MOUTH EVERY DAY 90 tablet 1   cephALEXin (KEFLEX) 250 MG capsule Take 1 capsule by mouth nightly as directed (Patient taking differently: Take 250 mg by mouth at bedtime. Continuous therapy due to 2 urinary stents) 90 capsule 1   clonazePAM (KLONOPIN) 1 MG tablet Take 1 tablet (1 mg total) by mouth at bedtime. 30 tablet 2   cycloSPORINE (RESTASIS) 0.05 % ophthalmic emulsion Place 1 drop both eyes twice a day (Patient taking differently: Place 1 drop into both eyes daily.) 180 each 3   doxycycline (VIBRA-TABS) 100 MG tablet Take 1 tablet (100 mg total) by mouth 2 (two) times daily for 7 days 14 tablet 0   gabapentin (NEURONTIN) 300 MG  capsule Take 1 capsule by mouth daily and 4 capsules at bedtime as directed. (Patient taking differently: Take 300-1,200 mg by mouth See admin instructions. Take 1 capsule by mouth in the morning then take 3 capsules at night) 450 capsule 1   HYDROcodone-acetaminophen (NORCO) 5-325 MG tablet Take 1 tablet by mouth every 6 (six) hours as needed for severe pain. 30 tablet 0   lidocaine-prilocaine (EMLA) cream Apply 1 application topically as needed. (Patient taking differently: Apply 1 application  topically as needed (for port access).) 30 g 0   Meth-Hyo-M Bl-Na Phos-Ph Sal (URIBEL) 118 MG CAPS Take one capsule by mouth 3 times daily (Patient not taking: Reported on 05/23/2022) 30 capsule 3   mirabegron ER (MYRBETRIQ) 25 MG TB24 tablet TAKE 1 TABLET BY MOUTH ONCE A DAY (Patient taking differently: Take 25 mg by mouth daily.) 30 tablet 3   Nebivolol HCl 20 MG TABS Take 1/2 tablet (10 mg total) by mouth daily. (Patient not taking: Reported on 05/23/2022) 90 tablet 1   omeprazole (PRILOSEC) 20 MG capsule Take 1 capsule (20 mg total) by mouth daily. 90 capsule 3   ondansetron (ZOFRAN) 4 MG tablet Take 1 tablet (4 mg total) by mouth every 8 (eight) hours as needed for nausea or vomiting. 40 tablet 3   oxymetazoline (AFRIN) 0.05 % nasal spray Place 1 spray into both nostrils 2 (two) times daily as needed for congestion. For nose bleeds     polyethylene glycol-electrolytes (NULYTELY) 420 g solution Use as directed 4000 mL 0   polyethylene glycol-electrolytes (NULYTELY) 420 g solution Take as directed over 2 days. 4000 mL 0   sertraline (ZOLOFT) 25 MG tablet Take 1 tablet (25 mg total) by mouth daily. 90 tablet 1   simvastatin (ZOCOR) 20 MG tablet TAKE 1 TABLET (20 MG TOTAL) BY MOUTH DAILY. (Patient taking differently: Take 20 mg by mouth at bedtime.) 90 tablet 3   tamsulosin (FLOMAX) 0.4 MG CAPS capsule TAKE 1 CAPSULE BY MOUTH AT BEDTIME 30 capsule 3   No current facility-administered medications for this  visit.   Facility-Administered Medications Ordered in Other Visits  Medication Dose Route Frequency Provider Last Rate Last Admin   heparin lock flush 100 unit/mL  500 Units Intracatheter Once PRN Wyatt Portela, MD       PACLitaxel (TAXOL) 336 mg in sodium chloride 0.9 % 500 mL chemo infusion (> '80mg'$ /m2)  175 mg/m2 (Treatment Plan Recorded) Intravenous Once Wyatt Portela, MD 185 mL/hr at  06/10/22 1230 Infusion Verify at 06/10/22 1230   sodium chloride flush (NS) 0.9 % injection 10 mL  10 mL Intracatheter PRN Wyatt Portela, MD        VITAL SIGNS: There were no vitals taken for this visit. There were no vitals filed for this visit.  Estimated body mass index is 24.81 kg/m as calculated from the following:   Height as of an earlier encounter on 06/10/22: '5\' 6"'$  (1.676 m).   Weight as of an earlier encounter on 06/10/22: 153 lb 11.2 oz (69.7 kg).  LABS: CBC:    Component Value Date/Time   WBC 8.6 06/10/2022 0815   WBC 10.7 (H) 05/30/2022 0500   HGB 8.8 (L) 06/10/2022 0815   HGB 12.4 04/26/2018 1333   HCT 27.5 (L) 06/10/2022 0815   HCT 39.1 04/26/2018 1333   PLT 321 06/10/2022 0815   PLT 327 04/26/2018 1333   MCV 103.4 (H) 06/10/2022 0815   MCV 97 04/26/2018 1333   NEUTROABS 5.8 06/10/2022 0815   NEUTROABS 7.0 04/26/2018 1333   LYMPHSABS 1.7 06/10/2022 0815   LYMPHSABS 2.6 04/26/2018 1333   MONOABS 0.8 06/10/2022 0815   EOSABS 0.1 06/10/2022 0815   EOSABS 0.0 04/26/2018 1333   BASOSABS 0.1 06/10/2022 0815   BASOSABS 0.0 04/26/2018 1333   Comprehensive Metabolic Panel:    Component Value Date/Time   NA 139 06/10/2022 0815   NA 142 04/26/2018 1333   K 3.9 06/10/2022 0815   CL 103 06/10/2022 0815   CO2 25 06/10/2022 0815   BUN 18 06/10/2022 0815   BUN 22 04/26/2018 1333   CREATININE 1.03 (H) 06/10/2022 0815   CREATININE 1.32 (H) 06/04/2021 0000   GLUCOSE 95 06/10/2022 0815   CALCIUM 9.0 06/10/2022 0815   AST 10 (L) 06/10/2022 0815   ALT 5 06/10/2022 0815   ALKPHOS  69 06/10/2022 0815   BILITOT 0.3 06/10/2022 0815   PROT 7.3 06/10/2022 0815   PROT 7.3 01/10/2018 1350   ALBUMIN 3.4 (L) 06/10/2022 0815   ALBUMIN 4.7 01/10/2018 1350    RADIOGRAPHIC STUDIES: DG Abd 1 View  Result Date: 05/25/2022 CLINICAL DATA:  Concern for retained foreign body EXAM: ABDOMEN - 1 VIEW COMPARISON:  None Available. FINDINGS: Nonobstructive pattern of bowel gas with gas present to the rectum. Status post midline laparotomy. Bilateral double-J ureteral stents with formed pigtails in the expected vicinity of the renal pelves and bladder. No unexpected radiopaque foreign body. No obvious free air in the abdomen. IMPRESSION: No unexpected radiopaque foreign body matching provided description of 1 inch x 1 inch metal square. Findings reported by telephone to operating room to [facility] RN, 12:01 p.m., 05/25/2022 Electronically Signed   By: Delanna Ahmadi M.D.   On: 05/25/2022 12:05   CT HEAD WO CONTRAST  Result Date: 05/24/2022 CLINICAL DATA:  Neuro deficit, acute, stroke suspected. EXAM: CT HEAD WITHOUT CONTRAST TECHNIQUE: Contiguous axial images were obtained from the base of the skull through the vertex without intravenous contrast. RADIATION DOSE REDUCTION: This exam was performed according to the departmental dose-optimization program which includes automated exposure control, adjustment of the mA and/or kV according to patient size and/or use of iterative reconstruction technique. COMPARISON:  Head CT 10/22/2020 FINDINGS: Brain: There is no evidence of an acute infarct, intracranial hemorrhage, mass, midline shift, or extra-axial fluid collection. Hypodensities in the cerebral white matter bilaterally are unchanged and nonspecific but compatible with mild chronic small vessel ischemic disease. The ventricles and sulci are within normal limits for  age. Vascular: No hyperdense vessel. Skull: No fracture or suspicious osseous lesion. Sinuses/Orbits: Paranasal sinuses and mastoid air  cells are clear. Unremarkable orbits. Other: None. IMPRESSION: 1. No evidence of acute intracranial abnormality. 2. Mild chronic small vessel ischemic disease. These results were communicated to Dr. Doristine Bosworth at 6:56 pm on 05/24/2022 by text page via the [facility] messaging system. Electronically Signed   By: Logan Bores M.D.   On: 05/24/2022 18:57   DG Chest 2 View  Result Date: 05/24/2022 CLINICAL DATA:  Productive cough and shortness of breath. EXAM: CHEST - 2 VIEW COMPARISON:  08/13/2021 FINDINGS: Low volume film. Basilar atelectasis noted with tiny left pleural effusion. The cardiopericardial silhouette is within normal limits for size. Right Port-A-Cath again noted. Bones are diffusely demineralized. IMPRESSION: Low volume film with basilar atelectasis and tiny left pleural effusion. Electronically Signed   By: Misty Stanley M.D.   On: 05/24/2022 11:58   CT ABDOMEN PELVIS W CONTRAST  Result Date: 05/23/2022 CLINICAL DATA:  Abdominal pain, acute, nonlocalized EXAM: CT ABDOMEN AND PELVIS WITH CONTRAST TECHNIQUE: Multidetector CT imaging of the abdomen and pelvis was performed using the standard protocol following bolus administration of intravenous contrast. RADIATION DOSE REDUCTION: This exam was performed according to the departmental dose-optimization program which includes automated exposure control, adjustment of the mA and/or kV according to patient size and/or use of iterative reconstruction technique. CONTRAST:  158m OMNIPAQUE IOHEXOL 300 MG/ML  SOLN COMPARISON:  04/21/2022 FINDINGS: Lower chest: No acute abnormality Hepatobiliary: No focal hepatic abnormality. Gallbladder unremarkable. Pancreas: No focal abnormality or ductal dilatation. Spleen: No focal abnormality.  Normal size. Adrenals/Urinary Tract: Bilateral ureteral stents are in place, as seen on prior study. Moderate left hydronephrosis is stable. No hydronephrosis on the right. Scarring and cortical thinning in the upper pole of the left  kidney. No adrenal mass. Urinary bladder unremarkable. Stomach/Bowel: Again noted is the area of circumferential wall thickening in the distal ascending colon near the hepatic flexure as seen on prior imaging. This appears to have worsened since prior study. Now, there is significant distention of the cecum and ascending colon proximal to this lesion, and small bowel loops are dilated and fluid-filled. Findings are suspicious for obstructing colon cancer. Vascular/Lymphatic: Aortic atherosclerosis. No evidence of aneurysm or adenopathy. Small scattered retroperitoneal lymph nodes again noted, slightly improved since prior study. Reproductive: Uterus and adnexa unremarkable.  No mass. Other: Mild ascites, best seen adjacent to the liver and in the right paracolic gutter. Musculoskeletal: No acute bony abnormality. IMPRESSION: Worsening circumferential irregular wall thickening in the distal ascending colon near the hepatic flexure which is now causing obstruction with dilated cecum and ascending colon proximal to this lesion as well as dilated fluid-filled small bowel loops. 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 Bilateral ureteral stents remain in place with moderate left hydronephrosis, unchanged since prior study. Improving right hydronephrosis. Mild ascites. Electronically Signed   By: KRolm BaptiseM.D.   On: 05/23/2022 23:43    PERFORMANCE STATUS (ECOG) : 1 - Symptomatic but completely ambulatory  Review of Systems  Constitutional:  Positive for appetite change and unexpected weight change.  Respiratory:         Dyspnea with exertion   Neurological:  Positive for weakness.  Unless otherwise noted, a complete review of systems is negative.  Physical Exam General: NAD Cardiovascular: regular rate and rhythm Pulmonary: normal breathing pattern  Abdomen: soft, nontender, + bowel sounds Extremities: no edema, no joint deformities Skin: no rashes Neurological: Weakness but otherwise  nonfocal  IMPRESSION: This is my initial visit with Mary Stark. Her husband is also present. She is in a wheelchair. No acute distress noted. Alert and oriented. Able to engage in discussions.   I introduced myself, [place] RN, and Palliative's role in collaboration with the oncology team. Concept of Palliative Care was introduced as specialized medical care for people and their families living with serious illness.  It focuses on providing relief from the symptoms and stress of a serious illness.  The goal is to improve quality of life for both the patient and the family. Values and goals of care important to patient and family were attempted to be elicited.   Mary Stark lives in the home with her husband or more than 20+ years. They have a daughter and 2 grandchildren who reside in the home with them. She is a retired Banker.   Ambulatory in the home with a walker. Requires some assistance with ADLs at times. Complains of dyspnea with exertion. Is not currently on home oxygen. States several days prior she was having some stool incontinence and also had to take antidiarrhea however this has resolved.  Appetite is minimal.  Denies nausea, vomiting, diarrhea, or constipation.  Neoplasm related pain Mary Stark reports ongoing abdominal pain.  She is currently taking hydrocodone which she feels is effective.  Abdominal pain is 9/10 and will decrease to 3/10 with medication.  She is also taking gabapentin 300 mg daily and 1200 mg at bedtime.  We plan to continue to closely monitor.  Decreased appetite/weight loss Mary Stark has lost a significant amount of weight.  States her appetite is minimal as she has no desire to eat.  States foods does not taste the same.  She is planning to restart her Megace as recommended by Dr. Alen Blew.  We discussed taking as prescribed to allow for medication to be effective.  Education provided on focusing on small frequent meals versus large meals,  increase protein intake including shakes, snacking when she has a desire.  She verbalized understanding.  Current weight is 153 pounds down from 174 pounds on 6/17.  Of note patient also was recently hospitalized and is status post hemicolectomy.  Goals of care We discussed her current illness and what it means in the larger context of Her on-going co-morbidities. Natural disease trajectory and expectations were discussed.  Mary Stark and her husband are realistic in their understanding of her current disease.  She is remaining hopeful for stability allow her every opportunity to continue to thrive.  Is starting new treatment on today with hopes of tolerating well.  I discussed the importance of continued conversation with family and their medical providers regarding overall plan of care and treatment options, ensuring decisions are within the context of the patients values and GOCs.  PLAN: Establish therapeutic relationship.  Education provided on palliative's role in collaboration with her oncology medical team.   Continue hydrocodone as needed for abdominal pain.  We will continue to closely monitor.   Restart Megace as recommended by Dr. Alen Blew.  Education provided on nutritional support.   MiraLAX daily for bowel regimen.   Ongoing goals of care discussions and support.   I will plan to see patient back in 3 weeks in collaboration to other oncology appointments.    Patient expressed understanding and was in agreement with this plan. She also understands that She can call the clinic at any time with any questions, concerns, or complaints.   Thank you for your  referral and allowing Palliative to assist in Mary Stark care.   Number and complexity of problems addressed: 2 HIGH - 1 or more chronic illnesses with SEVERE exacerbation, progression, or side effects of treatment - advanced cancer, pain. Any controlled substances utilized were prescribed in the context of palliative  care.  Time Total: 50 MIN  Visit consisted of counseling and education dealing with the complex and emotionally intense issues of symptom management and palliative care in the setting of serious and potentially life-threatening illness.Greater than 50%  of this time was spent counseling and coordinating care related to the above assessment and plan.  Signed by: Alda Lea, AGPCNP-BC Palliative Medicine Team/[place]

## 2022-06-11 ENCOUNTER — Encounter: Payer: Self-pay | Admitting: Oncology

## 2022-06-11 LAB — GLUCOSE, CAPILLARY: Glucose-Capillary: 130 mg/dL — ABNORMAL HIGH (ref 70–99)

## 2022-06-12 ENCOUNTER — Inpatient Hospital Stay: Payer: No Typology Code available for payment source

## 2022-06-12 ENCOUNTER — Other Ambulatory Visit: Payer: Self-pay

## 2022-06-12 VITALS — BP 122/85 | HR 73 | Temp 97.8°F | Resp 18

## 2022-06-12 DIAGNOSIS — Z5111 Encounter for antineoplastic chemotherapy: Secondary | ICD-10-CM | POA: Diagnosis not present

## 2022-06-12 DIAGNOSIS — C669 Malignant neoplasm of unspecified ureter: Secondary | ICD-10-CM

## 2022-06-12 DIAGNOSIS — C801 Malignant (primary) neoplasm, unspecified: Secondary | ICD-10-CM

## 2022-06-12 MED ORDER — PEGFILGRASTIM-JMDB 6 MG/0.6ML ~~LOC~~ SOSY
6.0000 mg | PREFILLED_SYRINGE | Freq: Once | SUBCUTANEOUS | Status: AC
  Administered 2022-06-12: 6 mg via SUBCUTANEOUS
  Filled 2022-06-12: qty 0.6

## 2022-06-17 ENCOUNTER — Encounter (HOSPITAL_COMMUNITY): Payer: Self-pay | Admitting: Oncology

## 2022-06-22 ENCOUNTER — Other Ambulatory Visit (HOSPITAL_COMMUNITY): Payer: Self-pay

## 2022-06-22 MED ORDER — MIRABEGRON ER 25 MG PO TB24
ORAL_TABLET | Freq: Every day | ORAL | 3 refills | Status: DC
  Filled 2022-06-22: qty 30, 30d supply, fill #0

## 2022-06-23 ENCOUNTER — Other Ambulatory Visit: Payer: Self-pay | Admitting: Oncology

## 2022-06-23 ENCOUNTER — Encounter (HOSPITAL_COMMUNITY): Payer: Self-pay | Admitting: Oncology

## 2022-06-23 NOTE — Progress Notes (Signed)
Most recent pathology from her colon resection is currently under evaluation for next generation sequencing.  Her PD-L1 combined positive score is 35 which could indicate positive response to PD-L1 immunotherapy.  Based on these results I will switch her treatment to Pembrolizumab and her cytotoxic chemotherapy if immunotherapy is not we will await the results of her full panel for further adjustments.

## 2022-06-23 NOTE — Progress Notes (Signed)
DISCONTINUE OFF PATHWAY REGIMEN - Bladder   [id number]:FOLFIRI (Leucovorin IV D1 + Fluorouracil IV D1/CIV D1,2 + Irinotecan IV D1) q14 Days:   A cycle is every 14 days:     Irinotecan      Leucovorin      Fluorouracil      Fluorouracil   **Always confirm dose/schedule in your pharmacy ordering system**  REASON: Other Reason PRIOR TREATMENT: Off Pathway: FOLFIRI (Leucovorin IV D1 + Fluorouracil IV D1/CIV D1,2 + Irinotecan IV D1) q14 Days TREATMENT RESPONSE: Unable to Evaluate  START OFF PATHWAY REGIMEN - Bladder   [id number]:Pembrolizumab 200 mg IV D1 q21 Days:   A cycle is every 21 days:     Pembrolizumab   **Always confirm dose/schedule in your pharmacy ordering system**  Patient Characteristics: Advanced/Metastatic Disease, Third Line and Beyond Therapeutic Status: Advanced/Metastatic Disease Line of Therapy: Third Line and Beyond  Intent of Therapy: Non-Curative / Palliative Intent, Discussed with Patient

## 2022-06-29 ENCOUNTER — Other Ambulatory Visit (HOSPITAL_COMMUNITY): Payer: Self-pay

## 2022-06-29 MED ORDER — MYRBETRIQ 50 MG PO TB24
50.0000 mg | ORAL_TABLET | Freq: Every day | ORAL | 3 refills | Status: DC
  Filled 2022-06-29: qty 30, 30d supply, fill #0
  Filled 2022-07-31: qty 30, 30d supply, fill #1
  Filled 2022-09-07: qty 30, 30d supply, fill #2
  Filled 2022-10-06 – 2022-10-08 (×2): qty 30, 30d supply, fill #3

## 2022-07-01 ENCOUNTER — Other Ambulatory Visit: Payer: Self-pay

## 2022-07-01 ENCOUNTER — Ambulatory Visit: Payer: No Typology Code available for payment source | Admitting: Oncology

## 2022-07-01 ENCOUNTER — Other Ambulatory Visit: Payer: No Typology Code available for payment source

## 2022-07-01 ENCOUNTER — Encounter: Payer: Self-pay | Admitting: Nurse Practitioner

## 2022-07-01 ENCOUNTER — Inpatient Hospital Stay (HOSPITAL_BASED_OUTPATIENT_CLINIC_OR_DEPARTMENT_OTHER): Payer: No Typology Code available for payment source | Admitting: Oncology

## 2022-07-01 ENCOUNTER — Inpatient Hospital Stay: Payer: No Typology Code available for payment source

## 2022-07-01 ENCOUNTER — Inpatient Hospital Stay: Payer: No Typology Code available for payment source | Attending: Oncology | Admitting: Nurse Practitioner

## 2022-07-01 VITALS — BP 137/80 | HR 77 | Temp 98.3°F | Resp 16 | Ht 66.0 in | Wt 158.1 lb

## 2022-07-01 DIAGNOSIS — C669 Malignant neoplasm of unspecified ureter: Secondary | ICD-10-CM

## 2022-07-01 DIAGNOSIS — G893 Neoplasm related pain (acute) (chronic): Secondary | ICD-10-CM | POA: Diagnosis not present

## 2022-07-01 DIAGNOSIS — Z5112 Encounter for antineoplastic immunotherapy: Secondary | ICD-10-CM | POA: Insufficient documentation

## 2022-07-01 DIAGNOSIS — C679 Malignant neoplasm of bladder, unspecified: Secondary | ICD-10-CM | POA: Diagnosis not present

## 2022-07-01 DIAGNOSIS — R630 Anorexia: Secondary | ICD-10-CM | POA: Diagnosis not present

## 2022-07-01 DIAGNOSIS — Z87891 Personal history of nicotine dependence: Secondary | ICD-10-CM | POA: Diagnosis not present

## 2022-07-01 DIAGNOSIS — Z515 Encounter for palliative care: Secondary | ICD-10-CM | POA: Insufficient documentation

## 2022-07-01 DIAGNOSIS — Z5111 Encounter for antineoplastic chemotherapy: Secondary | ICD-10-CM | POA: Diagnosis present

## 2022-07-01 DIAGNOSIS — R5383 Other fatigue: Secondary | ICD-10-CM | POA: Insufficient documentation

## 2022-07-01 DIAGNOSIS — Z95828 Presence of other vascular implants and grafts: Secondary | ICD-10-CM

## 2022-07-01 DIAGNOSIS — C799 Secondary malignant neoplasm of unspecified site: Secondary | ICD-10-CM

## 2022-07-01 DIAGNOSIS — C801 Malignant (primary) neoplasm, unspecified: Secondary | ICD-10-CM

## 2022-07-01 DIAGNOSIS — R530 Neoplastic (malignant) related fatigue: Secondary | ICD-10-CM | POA: Diagnosis not present

## 2022-07-01 DIAGNOSIS — Z79899 Other long term (current) drug therapy: Secondary | ICD-10-CM | POA: Diagnosis not present

## 2022-07-01 DIAGNOSIS — D630 Anemia in neoplastic disease: Secondary | ICD-10-CM | POA: Diagnosis not present

## 2022-07-01 DIAGNOSIS — C772 Secondary and unspecified malignant neoplasm of intra-abdominal lymph nodes: Secondary | ICD-10-CM | POA: Diagnosis not present

## 2022-07-01 LAB — CBC WITH DIFFERENTIAL (CANCER CENTER ONLY)
Abs Immature Granulocytes: 0.04 10*3/uL (ref 0.00–0.07)
Basophils Absolute: 0.1 10*3/uL (ref 0.0–0.1)
Basophils Relative: 1 %
Eosinophils Absolute: 0.2 10*3/uL (ref 0.0–0.5)
Eosinophils Relative: 3 %
HCT: 26.2 % — ABNORMAL LOW (ref 36.0–46.0)
Hemoglobin: 8.4 g/dL — ABNORMAL LOW (ref 12.0–15.0)
Immature Granulocytes: 1 %
Lymphocytes Relative: 18 %
Lymphs Abs: 1.6 10*3/uL (ref 0.7–4.0)
MCH: 33.6 pg (ref 26.0–34.0)
MCHC: 32.1 g/dL (ref 30.0–36.0)
MCV: 104.8 fL — ABNORMAL HIGH (ref 80.0–100.0)
Monocytes Absolute: 1 10*3/uL (ref 0.1–1.0)
Monocytes Relative: 12 %
Neutro Abs: 5.8 10*3/uL (ref 1.7–7.7)
Neutrophils Relative %: 65 %
Platelet Count: 217 10*3/uL (ref 150–400)
RBC: 2.5 MIL/uL — ABNORMAL LOW (ref 3.87–5.11)
RDW: 18.5 % — ABNORMAL HIGH (ref 11.5–15.5)
WBC Count: 8.8 10*3/uL (ref 4.0–10.5)
nRBC: 0 % (ref 0.0–0.2)

## 2022-07-01 LAB — CMP (CANCER CENTER ONLY)
ALT: 10 U/L (ref 0–44)
AST: 15 U/L (ref 15–41)
Albumin: 4 g/dL (ref 3.5–5.0)
Alkaline Phosphatase: 70 U/L (ref 38–126)
Anion gap: 6 (ref 5–15)
BUN: 28 mg/dL — ABNORMAL HIGH (ref 8–23)
CO2: 24 mmol/L (ref 22–32)
Calcium: 9.4 mg/dL (ref 8.9–10.3)
Chloride: 108 mmol/L (ref 98–111)
Creatinine: 1.05 mg/dL — ABNORMAL HIGH (ref 0.44–1.00)
GFR, Estimated: 59 mL/min — ABNORMAL LOW (ref >60)
Glucose, Bld: 110 mg/dL — ABNORMAL HIGH (ref 70–99)
Potassium: 4 mmol/L (ref 3.5–5.1)
Sodium: 138 mmol/L (ref 135–145)
Total Bilirubin: 0.3 mg/dL (ref 0.3–1.2)
Total Protein: 7.3 g/dL (ref 6.5–8.1)

## 2022-07-01 LAB — SAMPLE TO BLOOD BANK

## 2022-07-01 MED ORDER — SODIUM CHLORIDE 0.9 % IV SOLN: Freq: Once | INTRAVENOUS | Status: AC

## 2022-07-01 MED ORDER — HEPARIN SOD (PORK) LOCK FLUSH 100 UNIT/ML IV SOLN
500.0000 [IU] | Freq: Once | INTRAVENOUS | Status: AC | PRN
  Administered 2022-07-01: 500 [IU]

## 2022-07-01 MED ORDER — SODIUM CHLORIDE 0.9% FLUSH
10.0000 mL | INTRAVENOUS | Status: DC | PRN
  Administered 2022-07-01: 10 mL

## 2022-07-01 MED ORDER — SODIUM CHLORIDE 0.9 % IV SOLN
200.0000 mg | Freq: Once | INTRAVENOUS | Status: AC
  Administered 2022-07-01: 200 mg via INTRAVENOUS
  Filled 2022-07-01: qty 200

## 2022-07-01 MED ORDER — SODIUM CHLORIDE 0.9% FLUSH
10.0000 mL | Freq: Once | INTRAVENOUS | Status: AC
  Administered 2022-07-01: 10 mL

## 2022-07-01 NOTE — Progress Notes (Signed)
[place]  [phone] [phone]   Name: Mary Stark Date: 07/01/2022 MRN: [id number]  DOB: 05-07-1956  Patient Care Team: [name], NP as PCP - General (Nurse Practitioner) Norma Fredrickson, MD as Consulting Physician (Psychiatry) Gregor Hams, MD as Consulting Physician (Sports Medicine) Wyatt Portela, MD as Consulting Physician (Oncology) Arta Silence, MD as Consulting Physician (Gastroenterology)    INTERVAL HISTORY: Mary Stark is a 66 y.o. female with medical history including metastatic bladder cancer (10/2020) s/p chemotherapy, radiation to right psoas metastatic lesion (02/2022), recent hospitalization s/p right hemicolectomy secondary to recurrent tumor.  Palliative ask to see for symptom management and goals of care.   SOCIAL HISTORY:     reports that she quit smoking about 11 years ago. Her smoking use included cigarettes. She has a 25.00 pack-year smoking history. She has never used smokeless tobacco. She reports current alcohol use. She reports that she does not use drugs.  ADVANCE DIRECTIVES:    CODE STATUS:   PAST MEDICAL HISTORY: Past Medical History:  Diagnosis Date   Adenocarcinoma of bladder, stage 4 ([place]) 10/2020   oncologist--- dr Alen Blew and urologist-- dr pace;  w/ mets , started chemo 12/ 2021 and bilateral ureteral stents to treat hydronephrosis   Alcohol dependence ([place])    drank heavily years ago doesn't now 10/22/2021   Anemia due to chemotherapy    Anxiety    Arthritis    Back and lt knee   CKD (chronic kidney disease), stage III [facility])    COVID    August 2021, was in hospital and was on Oxygen for 11 weeks lungs still look like glass. 10/22/2021   Family history of adverse reaction to anesthesia    father had a "tight" airway   GERD (gastroesophageal reflux disease)    History of 2019 novel coronavirus disease (COVID-19) 08/13/2020   positive result in epic  08-19-2020,  hospital admission in epic,  covid pneumonia with hypoxia respiratory failure, discharged with oxygen for 6 weeks   History of chemotherapy last done 05-13-2021   History of colitis 03/2018   acute colitis with sepsis   History of hepatitis C    TX FOR 2012   History of kidney stones    History of rheumatic fever as a child    per pt no valvular issues   History of sepsis 10/2020   hospital admission in epic due to pyelonephritis due to hydronephrosis, resolved   History of skin cancer    excision from nose   Hyperlipidemia    Hypertension    MDD (major depressive disorder)    Pneumonia    august 2022   Seasonal allergies    Sepsis ([place])    uro sepsis June 2022   Vitamin D deficiency     ALLERGIES:  is allergic to metformin and related, penicillins, metformin, and penicillin g sodium.  MEDICATIONS:  Current Outpatient Medications  Medication Sig Dispense Refill   albuterol (PROAIR HFA) 108 (90 Base) MCG/ACT inhaler INHALE 1 TO 2 PUFFS BY MOUTH INTO THE LUNGS EVERY 4 HOURS AS NEEDED FOR WHEEZING OR SHORTNESS OF BREATH 8 g 1   ARIPiprazole (ABILIFY) 10 MG tablet TAKE 1 TABLET BY MOUTH ONCE DAILY (Patient taking differently: Take 10 mg by mouth at bedtime.) 90 tablet 0   azelastine (ASTELIN) 0.1 % nasal spray Place 2 sprays into both nostrils 2 (two) times daily. Use in each nostril as directed (Patient taking differently:  Place 2 sprays into both nostrils as needed for allergies. Use in each nostril as directed) 301 mL 1   bisacodyl (DULCOLAX) 5 MG EC tablet Take 2 tablets by mouth twice a day. (Patient taking differently: Take 10 mg by mouth 2 (two) times daily.) 4 tablet 0   bisacodyl (DULCOLAX) 5 MG EC tablet Take 2 tablets (10 mg total) by mouth 2 (two) times daily. 4 tablet 0   buPROPion (WELLBUTRIN XL) 300 MG 24 hr tablet TAKE 1 TABLET BY MOUTH EVERY DAY 90 tablet 1   cephALEXin (KEFLEX) 250 MG capsule Take 1 capsule by mouth nightly as directed (Patient taking  differently: Take 250 mg by mouth at bedtime. Continuous therapy due to 2 urinary stents) 90 capsule 1   clonazePAM (KLONOPIN) 1 MG tablet Take 1 tablet (1 mg total) by mouth at bedtime. 30 tablet 2   cycloSPORINE (RESTASIS) 0.05 % ophthalmic emulsion Place 1 drop both eyes twice a day (Patient taking differently: Place 1 drop into both eyes daily.) 180 each 3   doxycycline (VIBRA-TABS) 100 MG tablet Take 1 tablet (100 mg total) by mouth 2 (two) times daily for 7 days 14 tablet 0   gabapentin (NEURONTIN) 300 MG capsule Take 1 capsule by mouth daily and 4 capsules at bedtime as directed. (Patient taking differently: Take 300-1,200 mg by mouth See admin instructions. Take 1 capsule by mouth in the morning then take 3 capsules at night) 450 capsule 1   HYDROcodone-acetaminophen (NORCO) 5-325 MG tablet Take 1 tablet by mouth every 6 (six) hours as needed for severe pain. 30 tablet 0   lidocaine-prilocaine (EMLA) cream Apply 1 application topically as needed. (Patient taking differently: Apply 1 application  topically as needed (for port access).) 30 g 0   Meth-Hyo-M Bl-Na Phos-Ph Sal (URIBEL) 118 MG CAPS Take one capsule by mouth 3 times daily (Patient not taking: Reported on 05/23/2022) 30 capsule 3   mirabegron ER (MYRBETRIQ) 25 MG TB24 tablet TAKE 1 TABLET BY MOUTH ONCE A DAY 30 tablet 3   mirabegron ER (MYRBETRIQ) 50 MG TB24 tablet Take 1 tablet by mouth daily. 30 tablet 3   Nebivolol HCl 20 MG TABS Take 1/2 tablet (10 mg total) by mouth daily. (Patient not taking: Reported on 05/23/2022) 90 tablet 1   omeprazole (PRILOSEC) 20 MG capsule Take 1 capsule (20 mg total) by mouth daily. 90 capsule 3   ondansetron (ZOFRAN) 4 MG tablet Take 1 tablet (4 mg total) by mouth every 8 (eight) hours as needed for nausea or vomiting. 40 tablet 3   oxymetazoline (AFRIN) 0.05 % nasal spray Place 1 spray into both nostrils 2 (two) times daily as needed for congestion. For nose bleeds     polyethylene glycol-electrolytes  (NULYTELY) 420 g solution Use as directed 4000 mL 0   polyethylene glycol-electrolytes (NULYTELY) 420 g solution Take as directed over 2 days. 4000 mL 0   sertraline (ZOLOFT) 25 MG tablet Take 1 tablet (25 mg total) by mouth daily. 90 tablet 1   simvastatin (ZOCOR) 20 MG tablet TAKE 1 TABLET (20 MG TOTAL) BY MOUTH DAILY. (Patient taking differently: Take 20 mg by mouth at bedtime.) 90 tablet 3   tamsulosin (FLOMAX) 0.4 MG CAPS capsule TAKE 1 CAPSULE BY MOUTH AT BEDTIME 30 capsule 3   No current facility-administered medications for this visit.   Facility-Administered Medications Ordered in Other Visits  Medication Dose Route Frequency Provider Last Rate Last Admin   sodium chloride flush (NS) 0.9 % injection  10 mL  10 mL Intracatheter PRN Wyatt Portela, MD   10 mL at 07/01/22 1130    VITAL SIGNS: There were no vitals taken for this visit. There were no vitals filed for this visit.  Estimated body mass index is 25.52 kg/m as calculated from the following:   Height as of an earlier encounter on 07/01/22: '5\' 6"'$  (1.676 m).   Weight as of an earlier encounter on 07/01/22: 158 lb 1.6 oz (71.7 kg).   PERFORMANCE STATUS (ECOG) : 1 - Symptomatic but completely ambulatory   Physical Exam General: NAD Pulmonary: normal breathing pattern  Extremities: no edema, no joint deformities Skin: no rashes Neurological: AAOx3  IMPRESSION: I saw Mrs. Bai during her infusion today. No acute distress noted. Feels much better than previous weeks. Ongoing fatigue but improving.   Neoplasm related pain Pain is much improved. Is not taking hydrocodone. Tolerating gabapentin. States she will take occasional Tylenol as needed.   Decreased appetite/weight loss  Appetite is slowly improving. Her weight has increased 158lbs up from 153lbs on 6/28. Tolerating megace.   I discussed the importance of continued conversation with family and their medical providers regarding overall plan of care and treatment  options, ensuring decisions are within the context of the patients values and GOCs.  PLAN: No symptom needs at this time Ongoing support as needed.    Patient expressed understanding and was in agreement with this plan. She also understands that She can call the clinic at any time with any questions, concerns, or complaints.    Time Total: 20 min   Visit consisted of counseling and education dealing with the complex and emotionally intense issues of symptom management and palliative care in the setting of serious and potentially life-threatening illness.Greater than 50%  of this time was spent counseling and coordinating care related to the above assessment and plan.  Signed by: Alda Lea, AGPCNP-BC [place]

## 2022-07-01 NOTE — Progress Notes (Signed)
Denies anyHematology and Oncology Follow Up Visit  Mary Stark [id number] 10-01-1956 66 y.o. 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  She was found to have PD-L1 CPS score of 35   Prior Therapy:  She is status post retroperitoneal lymph node biopsy completed on 10/24/2020 which showed metastatic carcinoma.  She is status post repeat cystoscopy and bilateral ureteral stent exchange as well as resection of a bladder tumor on January 21, 2021.  A final pathology showed an adenocarcinoma.  Chemotherapy utilizing carboplatin and gemcitabine started on November 22, 2020.  She completed 6 cycles of therapy on March 06, 2021.  FOLFOX chemotherapy started on Apr 15, 2021.  She completed 12 cycles of therapy.  She is status post radiation therapy to a right psoas metastatic lesion. She will complete 50 Gray in 5 fractions on February 27, 2022.  5-FU and leucovorin chemotherapy every 2 weeks started in November 2022.  She received maintenance therapy till June 2023 due to progression of disease.  She is status post right hemicolectomy completed on July 25, 2022.  The final pathology carcinoma that is a similar to her GU primary.  She received 1 cycle of Taxol chemotherapy on June 10, 2022.  Current therapy: Pembrolizumab 200 milligrams every 3 weeks to start on July 01, 2022.  Interim History: Mary Stark presents today for follow-up.  Since last visit, she received 1 cycle of Taxol chemotherapy and the plan to switch her to Pembrolizumab given her high CPS score.  Clinically, she reports feeling fatigued but overall improving.  She denies any chest pain or shortness of breath.  She denies any abdominal pain or discomfort.  She denies any hospitalizations.    Medications: Updated on review. Current Outpatient Medications  Medication Sig Dispense  Refill   albuterol (PROAIR HFA) 108 (90 Base) MCG/ACT inhaler INHALE 1 TO 2 PUFFS BY MOUTH INTO THE LUNGS EVERY 4 HOURS AS NEEDED FOR WHEEZING OR SHORTNESS OF BREATH 8 g 1   ARIPiprazole (ABILIFY) 10 MG tablet TAKE 1 TABLET BY MOUTH ONCE DAILY (Patient taking differently: Take 10 mg by mouth at bedtime.) 90 tablet 0   azelastine (ASTELIN) 0.1 % nasal spray Place 2 sprays into both nostrils 2 (two) times daily. Use in each nostril as directed (Patient taking differently: Place 2 sprays into both nostrils as needed for allergies. Use in each nostril as directed) 301 mL 1   bisacodyl (DULCOLAX) 5 MG EC tablet Take 2 tablets by mouth twice a day. (Patient taking differently: Take 10 mg by mouth 2 (two) times daily.) 4 tablet 0   bisacodyl (DULCOLAX) 5 MG EC tablet Take 2 tablets (10 mg total) by mouth 2 (two) times daily. 4 tablet 0   buPROPion (WELLBUTRIN XL) 300 MG 24 hr tablet TAKE 1 TABLET BY MOUTH EVERY DAY 90 tablet 1   cephALEXin (KEFLEX) 250 MG capsule Take 1 capsule by mouth nightly as directed (Patient taking differently: Take 250 mg by mouth at bedtime. Continuous therapy due to 2 urinary stents) 90 capsule 1   clonazePAM (KLONOPIN) 1 MG tablet Take 1 tablet (1 mg total) by mouth at bedtime. 30 tablet 2   cycloSPORINE (RESTASIS) 0.05 % ophthalmic emulsion Place 1 drop both eyes twice a day (Patient taking differently: Place 1 drop into both eyes daily.) 180 each 3   doxycycline (VIBRA-TABS) 100 MG tablet Take 1 tablet (  100 mg total) by mouth 2 (two) times daily for 7 days 14 tablet 0   gabapentin (NEURONTIN) 300 MG capsule Take 1 capsule by mouth daily and 4 capsules at bedtime as directed. (Patient taking differently: Take 300-1,200 mg by mouth See admin instructions. Take 1 capsule by mouth in the morning then take 3 capsules at night) 450 capsule 1   HYDROcodone-acetaminophen (NORCO) 5-325 MG tablet Take 1 tablet by mouth every 6 (six) hours as needed for severe pain. 30 tablet 0    lidocaine-prilocaine (EMLA) cream Apply 1 application topically as needed. (Patient taking differently: Apply 1 application  topically as needed (for port access).) 30 g 0   Meth-Hyo-M Bl-Na Phos-Ph Sal (URIBEL) 118 MG CAPS Take one capsule by mouth 3 times daily (Patient not taking: Reported on 05/23/2022) 30 capsule 3   mirabegron ER (MYRBETRIQ) 25 MG TB24 tablet TAKE 1 TABLET BY MOUTH ONCE A DAY 30 tablet 3   mirabegron ER (MYRBETRIQ) 50 MG TB24 tablet Take 1 tablet by mouth daily. 30 tablet 3   Nebivolol HCl 20 MG TABS Take 1/2 tablet (10 mg total) by mouth daily. (Patient not taking: Reported on 05/23/2022) 90 tablet 1   omeprazole (PRILOSEC) 20 MG capsule Take 1 capsule (20 mg total) by mouth daily. 90 capsule 3   ondansetron (ZOFRAN) 4 MG tablet Take 1 tablet (4 mg total) by mouth every 8 (eight) hours as needed for nausea or vomiting. 40 tablet 3   oxymetazoline (AFRIN) 0.05 % nasal spray Place 1 spray into both nostrils 2 (two) times daily as needed for congestion. For nose bleeds     polyethylene glycol-electrolytes (NULYTELY) 420 g solution Use as directed 4000 mL 0   polyethylene glycol-electrolytes (NULYTELY) 420 g solution Take as directed over 2 days. 4000 mL 0   sertraline (ZOLOFT) 25 MG tablet Take 1 tablet (25 mg total) by mouth daily. 90 tablet 1   simvastatin (ZOCOR) 20 MG tablet TAKE 1 TABLET (20 MG TOTAL) BY MOUTH DAILY. (Patient taking differently: Take 20 mg by mouth at bedtime.) 90 tablet 3   tamsulosin (FLOMAX) 0.4 MG CAPS capsule TAKE 1 CAPSULE BY MOUTH AT BEDTIME 30 capsule 3   No current facility-administered medications for this visit.     Allergies:  Allergies  Allergen Reactions   Metformin And Related Other (See Comments)    Lactic acidosis    Penicillins Hives and Rash    Reports hives to penicillin and amoxicillin as an adult "a long time ago." Has tolerated cefdinir (09/2020) and cefepime (10/2020) before.   Metformin Diarrhea    Extreme diarrhea and lactic  acidosis   Penicillin G Sodium Hives and Rash      Physical Exam:       Blood pressure 137/80, pulse 77, temperature 98.3 F (36.8 C), temperature source Tympanic, resp. rate 16, height '5\' 6"'  (1.676 m), weight 158 lb 1.6 oz (71.7 kg), SpO2 100 %.           ECOG: 1    General appearance: Comfortable appearing without any discomfort Head: Normocephalic without any trauma Oropharynx: Mucous membranes are moist and pink without any thrush or ulcers. Eyes: Pupils are equal and round reactive to light. Lymph nodes: No cervical, supraclavicular, inguinal or axillary lymphadenopathy.   Heart:regular rate and rhythm.  S1 and S2 without leg edema. Lung: Clear without any rhonchi or wheezes.  No dullness to percussion. Abdomin: Soft, nontender, nondistended with good bowel sounds.  No hepatosplenomegaly. Musculoskeletal: No joint  deformity or effusion.  Full range of motion noted. Neurological: No deficits noted on motor, sensory and deep tendon reflex exam. Skin: No petechial rash or dryness.  Appeared moist.                         Lab Results: Lab Results  Component Value Date   WBC 8.6 06/10/2022   HGB 8.8 (L) 06/10/2022   HCT 27.5 (L) 06/10/2022   MCV 103.4 (H) 06/10/2022   PLT 321 06/10/2022     Chemistry      Component Value Date/Time   NA 139 06/10/2022 0815   NA 142 04/26/2018 1333   K 3.9 06/10/2022 0815   CL 103 06/10/2022 0815   CO2 25 06/10/2022 0815   BUN 18 06/10/2022 0815   BUN 22 04/26/2018 1333   CREATININE 1.03 (H) 06/10/2022 0815   CREATININE 1.32 (H) 06/04/2021 0000   GLU 87 02/07/2013 0000      Component Value Date/Time   CALCIUM 9.0 06/10/2022 0815   ALKPHOS 69 06/10/2022 0815   AST 10 (L) 06/10/2022 0815   ALT 5 06/10/2022 0815   BILITOT 0.3 06/10/2022 0815        Impression and Plan:    66 year old woman with:   1.   Bladder cancer diagnosed in November 2021.  She was found to have stage IV  adenocarcinoma pelvic adenopathy.  She developed recurrent disease with colonic obstruction.   Different salvage treatment options were discussed at this time.  She received 1 cycle of Taxotere chemotherapy but I recommended switching her to Pembrolizumab given her high CPS score and given the fact that it is unlikely that she will respond to traditional chemotherapy.  Complication with Pembrolizumab including nausea, fatigue and autoimmune considerations were reiterated.  She is agreeable to proceed at this time.   2.  IV access: Port-A-Cath will continue to be in use at this time.  3.  Antiemetics: Compazine is available to her.  No nausea or vomiting reported at this time.   4.  Renal function surveillance: No decline in her kidney function noted at this time.   5.  Goals of care: Her disease is incurable although aggressive measures are warranted at this time.  6.  Anemia: Related to malignancy and chemotherapy.  Her hemoglobin is adequate and does not require transfusion.  7.  Colon mass: She is status post hemicolectomy with a final pathology showed recurrent disease.  8.  Autoimmune considerations: Continue to educate her about potential complication clued pneumonitis, colitis and thyroid disease.   9.  Follow-up: In 3 weeks for the next cycle of therapy.   30  minutes were spent on this visit.  The time was dedicated to reviewing pathology results, disease status update, addressing complication related to cancer and cancer therapies.  Zola Button, MD 7/19/20238:45 AM

## 2022-07-01 NOTE — Patient Instructions (Addendum)
[facility]  Discharge Instructions: Thank you for choosing [facility] to provide your oncology and hematology care.   If you have a lab appointment with the [facility], please go directly to the [facility] and check in at the registration area.   Wear comfortable clothing and clothing appropriate for easy access to any Portacath or PICC line.   We strive to give you quality time with your provider. You may need to reschedule your appointment if you arrive late (15 or more minutes).  Arriving late affects you and other patients whose appointments are after yours.  Also, if you miss three or more appointments without notifying the office, you may be dismissed from the clinic at the provider's discretion.      For prescription refill requests, have your pharmacy contact our office and allow 72 hours for refills to be completed.    Today you received the following chemotherapy and/or immunotherapy agents: pembrolizumab Beryle Flock)      To help prevent nausea and vomiting after your treatment, we encourage you to take your nausea medication as directed.  BELOW ARE SYMPTOMS THAT SHOULD BE REPORTED IMMEDIATELY: *FEVER GREATER THAN 100.4 F (38 C) OR HIGHER *CHILLS OR SWEATING *NAUSEA AND VOMITING THAT IS NOT CONTROLLED WITH YOUR NAUSEA MEDICATION *UNUSUAL SHORTNESS OF BREATH *UNUSUAL BRUISING OR BLEEDING *URINARY PROBLEMS (pain or burning when urinating, or frequent urination) *BOWEL PROBLEMS (unusual diarrhea, constipation, pain near the anus) TENDERNESS IN MOUTH AND THROAT WITH OR WITHOUT PRESENCE OF ULCERS (sore throat, sores in mouth, or a toothache) UNUSUAL RASH, SWELLING OR PAIN  UNUSUAL VAGINAL DISCHARGE OR ITCHING   Items with * indicate a potential emergency and should be followed up as soon as possible or go to the Emergency Department if any problems should occur.  Please show the CHEMOTHERAPY ALERT CARD or IMMUNOTHERAPY ALERT CARD  at check-in to the Emergency Department and triage nurse.  Should you have questions after your visit or need to cancel or reschedule your appointment, please contact [facility]  Dept: [phone]  and follow the prompts.  Office hours are 8:00 a.m. to 4:30 p.m. Monday - Friday. Please note that voicemails left after 4:00 p.m. may not be returned until the following business day.  We are closed weekends and major holidays. You have access to a nurse at all times for urgent questions. Please call the main number to the clinic Dept: [phone] and follow the prompts.   For any non-urgent questions, you may also contact your provider using MyChart. We now offer e-Visits for anyone 30 and older to request care online for non-urgent symptoms. For details visit [email or web address].   Also download the MyChart app! Go to the app store, search "MyChart", open the app, select [facility], and log in with your MyChart username and password.  Masks are optional in the cancer centers. If you would like for your care team to wear a mask while they are taking care of you, please let them know. For doctor visits, patients may have with them one support person who is at least 65 years old. At this time, visitors are not allowed in the infusion area.Pembrolizumab injection What is this medication? PEMBROLIZUMAB (pem broe liz ue mab) is a monoclonal antibody. It is used to treat certain types of cancer. This medicine may be used for other purposes; ask your health care provider or pharmacist if you have questions. COMMON BRAND NAME(S): Keytruda What should I  tell my care team before I take this medication? They need to know if you have any of these conditions: autoimmune diseases like Crohn's disease, ulcerative colitis, or lupus have had or planning to have an allogeneic stem cell transplant (uses someone else's stem cells) history of organ transplant history of chest  radiation nervous system problems like myasthenia gravis or Guillain-Barre syndrome an unusual or allergic reaction to pembrolizumab, other medicines, foods, dyes, or preservatives pregnant or trying to get pregnant breast-feeding How should I use this medication? This medicine is for infusion into a vein. It is given by a health care professional in a hospital or clinic setting. A special MedGuide will be given to you before each treatment. Be sure to read this information carefully each time. Talk to your pediatrician regarding the use of this medicine in children. While this drug may be prescribed for children as young as 6 months for selected conditions, precautions do apply. Overdosage: If you think you have taken too much of this medicine contact a poison control center or emergency room at once. NOTE: This medicine is only for you. Do not share this medicine with others. What if I miss a dose? It is important not to miss your dose. Call your doctor or health care professional if you are unable to keep an appointment. What may interact with this medication? Interactions have not been studied. This list may not describe all possible interactions. Give your health care provider a list of all the medicines, herbs, non-prescription drugs, or dietary supplements you use. Also tell them if you smoke, drink alcohol, or use illegal drugs. Some items may interact with your medicine. What should I watch for while using this medication? Your condition will be monitored carefully while you are receiving this medicine. You may need blood work done while you are taking this medicine. Do not become pregnant while taking this medicine or for 4 months after stopping it. Women should inform their doctor if they wish to become pregnant or think they might be pregnant. There is a potential for serious side effects to an unborn child. Talk to your health care professional or pharmacist for more information. Do  not breast-feed an infant while taking this medicine or for 4 months after the last dose. What side effects may I notice from receiving this medication? Side effects that you should report to your doctor or health care professional as soon as possible: allergic reactions like skin rash, itching or hives, swelling of the face, lips, or tongue bloody or black, tarry breathing problems changes in vision chest pain chills confusion constipation cough diarrhea dizziness or feeling faint or lightheaded fast or irregular heartbeat fever flushing joint pain low blood counts - this medicine may decrease the number of white blood cells, red blood cells and platelets. You may be at increased risk for infections and bleeding. muscle pain muscle weakness pain, tingling, numbness in the hands or feet persistent headache redness, blistering, peeling or loosening of the skin, including inside the mouth signs and symptoms of high blood sugar such as dizziness; dry mouth; dry skin; fruity breath; nausea; stomach pain; increased hunger or thirst; increased urination signs and symptoms of kidney injury like trouble passing urine or change in the amount of urine signs and symptoms of liver injury like dark urine, light-colored stools, loss of appetite, nausea, right upper belly pain, yellowing of the eyes or skin sweating swollen lymph nodes weight loss Side effects that usually do not require medical attention (report  to your doctor or health care professional if they continue or are bothersome): decreased appetite hair loss tiredness This list may not describe all possible side effects. Call your doctor for medical advice about side effects. You may report side effects to FDA at 1-800-FDA-1088. Where should I keep my medication? This drug is given in a hospital or clinic and will not be stored at home. NOTE: This sheet is a summary. It may not cover all possible information. If you have questions  about this medicine, talk to your doctor, pharmacist, or health care provider.  2023 Elsevier/Gold Standard (2021-10-31 00:00:00)

## 2022-07-03 ENCOUNTER — Telehealth: Payer: Self-pay | Admitting: Oncology

## 2022-07-03 ENCOUNTER — Ambulatory Visit: Payer: No Typology Code available for payment source

## 2022-07-03 NOTE — Telephone Encounter (Signed)
Scheduled per 07/19 los, patient has been called and notified of all upcoming August appointments.

## 2022-07-05 NOTE — Progress Notes (Signed)
Virtual Visit via Telephone   I connected with  Mary Stark  on 07/06/22 by telephone/telehealth and verified that I am speaking with the correct person using two identifiers.   I discussed the limitations, risks, security and privacy concerns of performing an evaluation and management service by telephone, including the higher likelihood of inaccurate diagnosis and treatment, and the availability of in person appointments.  We also discussed the likely need of an additional face to face encounter for complete and high quality delivery of care.  I also discussed with the patient that there may be a patient responsible charge related to this service. The patient expressed understanding and wishes to proceed.  Provider location is in medical facility. Patient location is at their home, different from provider location. People involved in care of the patient during this telehealth encounter were myself, my nurse/medical assistant, and my front office/scheduling team member.  CC: Rhinorrhea  HPI: Pleasant 66 year old female presenting via telephone to discuss rhinorrhea.  Notes that she has had a runny nose for at least 3 years.  Notes that she started vaping around that time is wondering if this may have something to do with it.  Also wonders if this may be related to allergies.  She has used azelastine nasal spray in the past and reports that it works well but she is currently out of this.  Does have clear nasal discharge in the mornings followed by runny nose throughout the day in the afternoon.  This is interfering with her regular activities and she notes that she has a hard time knitted because holding her head down and in her nose running.  No recent fevers, chills, or signs of illness.  Review of Systems: See HPI for pertinent positives and negatives.   Objective Findings:    General: Speaking full sentences, no audible heavy breathing.  Sounds alert and appropriately interactive.     Impression and Recommendations:    1. Rhinorrhea Restart azelastine nasal spray twice daily.  Okay to titrate this depending on the dryness of her sinuses.  Starting Allegra 180 mg daily.  If this is not helpful, advised her to reach out and we will adjust her antihistamine as well as potentially switch to ipratropium nasal spray.  I discussed the above assessment and treatment plan with the patient. The patient was provided an opportunity to ask questions and all were answered. The patient agreed with the plan and demonstrated an understanding of the instructions.   The patient was advised to call back or seek an in-person evaluation if the symptoms worsen or if the condition fails to improve as anticipated.  15 minutes of non-face-to-face time was provided during this encounter.  Return if symptoms worsen or fail to improve. ___________________________________________ [name], DNP, APRN, FNP-BC [facility]

## 2022-07-06 ENCOUNTER — Telehealth (INDEPENDENT_AMBULATORY_CARE_PROVIDER_SITE_OTHER): Payer: No Typology Code available for payment source | Admitting: Medical-Surgical

## 2022-07-06 ENCOUNTER — Other Ambulatory Visit (HOSPITAL_COMMUNITY): Payer: Self-pay

## 2022-07-06 ENCOUNTER — Encounter: Payer: Self-pay | Admitting: Oncology

## 2022-07-06 ENCOUNTER — Encounter: Payer: Self-pay | Admitting: Medical-Surgical

## 2022-07-06 VITALS — Ht 66.0 in | Wt 153.5 lb

## 2022-07-06 DIAGNOSIS — J3489 Other specified disorders of nose and nasal sinuses: Secondary | ICD-10-CM | POA: Diagnosis not present

## 2022-07-06 MED ORDER — AZELASTINE HCL 0.1 % NA SOLN
2.0000 | NASAL | 5 refills | Status: DC | PRN
  Filled 2022-07-06: qty 30, 25d supply, fill #0

## 2022-07-06 MED ORDER — FEXOFENADINE HCL 180 MG PO TABS
180.0000 mg | ORAL_TABLET | Freq: Every day | ORAL | 3 refills | Status: DC
  Filled 2022-07-06: qty 90, 90d supply, fill #0
  Filled 2022-12-28: qty 90, 90d supply, fill #1
  Filled 2023-04-13: qty 90, 90d supply, fill #2

## 2022-07-11 ENCOUNTER — Other Ambulatory Visit (HOSPITAL_COMMUNITY): Payer: Self-pay

## 2022-07-13 ENCOUNTER — Other Ambulatory Visit: Payer: Self-pay

## 2022-07-13 ENCOUNTER — Other Ambulatory Visit: Payer: Self-pay | Admitting: Medical-Surgical

## 2022-07-13 ENCOUNTER — Other Ambulatory Visit (HOSPITAL_COMMUNITY): Payer: Self-pay

## 2022-07-13 DIAGNOSIS — Z791 Long term (current) use of non-steroidal anti-inflammatories (NSAID): Secondary | ICD-10-CM

## 2022-07-13 MED ORDER — OMEPRAZOLE 20 MG PO CPDR
20.0000 mg | DELAYED_RELEASE_CAPSULE | Freq: Every day | ORAL | 3 refills | Status: DC
  Filled 2022-07-13: qty 90, 90d supply, fill #0

## 2022-07-13 MED ORDER — ARIPIPRAZOLE 10 MG PO TABS
ORAL_TABLET | Freq: Every day | ORAL | 0 refills | Status: DC
  Filled 2022-07-13: qty 90, 90d supply, fill #0

## 2022-07-14 ENCOUNTER — Other Ambulatory Visit (HOSPITAL_COMMUNITY): Payer: Self-pay

## 2022-07-22 ENCOUNTER — Inpatient Hospital Stay: Payer: No Typology Code available for payment source

## 2022-07-22 ENCOUNTER — Inpatient Hospital Stay: Payer: No Typology Code available for payment source | Attending: Oncology | Admitting: Oncology

## 2022-07-22 ENCOUNTER — Other Ambulatory Visit: Payer: Self-pay

## 2022-07-22 VITALS — BP 153/84 | HR 64 | Temp 97.6°F | Resp 16

## 2022-07-22 VITALS — BP 165/73 | HR 66 | Temp 97.8°F | Resp 17 | Ht 66.0 in | Wt 157.8 lb

## 2022-07-22 DIAGNOSIS — Z9049 Acquired absence of other specified parts of digestive tract: Secondary | ICD-10-CM | POA: Diagnosis not present

## 2022-07-22 DIAGNOSIS — C669 Malignant neoplasm of unspecified ureter: Secondary | ICD-10-CM | POA: Diagnosis present

## 2022-07-22 DIAGNOSIS — C679 Malignant neoplasm of bladder, unspecified: Secondary | ICD-10-CM

## 2022-07-22 DIAGNOSIS — Z95828 Presence of other vascular implants and grafts: Secondary | ICD-10-CM

## 2022-07-22 DIAGNOSIS — Z515 Encounter for palliative care: Secondary | ICD-10-CM | POA: Insufficient documentation

## 2022-07-22 DIAGNOSIS — Z5112 Encounter for antineoplastic immunotherapy: Secondary | ICD-10-CM | POA: Diagnosis present

## 2022-07-22 DIAGNOSIS — C772 Secondary and unspecified malignant neoplasm of intra-abdominal lymph nodes: Secondary | ICD-10-CM | POA: Diagnosis not present

## 2022-07-22 DIAGNOSIS — C799 Secondary malignant neoplasm of unspecified site: Secondary | ICD-10-CM

## 2022-07-22 DIAGNOSIS — G893 Neoplasm related pain (acute) (chronic): Secondary | ICD-10-CM | POA: Diagnosis not present

## 2022-07-22 DIAGNOSIS — Z79899 Other long term (current) drug therapy: Secondary | ICD-10-CM | POA: Diagnosis not present

## 2022-07-22 DIAGNOSIS — D630 Anemia in neoplastic disease: Secondary | ICD-10-CM | POA: Diagnosis not present

## 2022-07-22 DIAGNOSIS — Z87891 Personal history of nicotine dependence: Secondary | ICD-10-CM | POA: Diagnosis not present

## 2022-07-22 DIAGNOSIS — C801 Malignant (primary) neoplasm, unspecified: Secondary | ICD-10-CM

## 2022-07-22 DIAGNOSIS — Z923 Personal history of irradiation: Secondary | ICD-10-CM | POA: Insufficient documentation

## 2022-07-22 LAB — CBC WITH DIFFERENTIAL (CANCER CENTER ONLY)
Abs Immature Granulocytes: 0.01 10*3/uL (ref 0.00–0.07)
Basophils Absolute: 0.1 10*3/uL (ref 0.0–0.1)
Basophils Relative: 1 %
Eosinophils Absolute: 0.4 10*3/uL (ref 0.0–0.5)
Eosinophils Relative: 6 %
HCT: 28.3 % — ABNORMAL LOW (ref 36.0–46.0)
Hemoglobin: 9.1 g/dL — ABNORMAL LOW (ref 12.0–15.0)
Immature Granulocytes: 0 %
Lymphocytes Relative: 35 %
Lymphs Abs: 2.4 10*3/uL (ref 0.7–4.0)
MCH: 32.7 pg (ref 26.0–34.0)
MCHC: 32.2 g/dL (ref 30.0–36.0)
MCV: 101.8 fL — ABNORMAL HIGH (ref 80.0–100.0)
Monocytes Absolute: 0.6 10*3/uL (ref 0.1–1.0)
Monocytes Relative: 9 %
Neutro Abs: 3.4 10*3/uL (ref 1.7–7.7)
Neutrophils Relative %: 49 %
Platelet Count: 207 10*3/uL (ref 150–400)
RBC: 2.78 MIL/uL — ABNORMAL LOW (ref 3.87–5.11)
RDW: 16.2 % — ABNORMAL HIGH (ref 11.5–15.5)
WBC Count: 6.9 10*3/uL (ref 4.0–10.5)
nRBC: 0 % (ref 0.0–0.2)

## 2022-07-22 LAB — CMP (CANCER CENTER ONLY)
ALT: 12 U/L (ref 0–44)
AST: 15 U/L (ref 15–41)
Albumin: 4.2 g/dL (ref 3.5–5.0)
Alkaline Phosphatase: 67 U/L (ref 38–126)
Anion gap: 5 (ref 5–15)
BUN: 25 mg/dL — ABNORMAL HIGH (ref 8–23)
CO2: 27 mmol/L (ref 22–32)
Calcium: 9 mg/dL (ref 8.9–10.3)
Chloride: 104 mmol/L (ref 98–111)
Creatinine: 1.01 mg/dL — ABNORMAL HIGH (ref 0.44–1.00)
GFR, Estimated: >60 mL/min (ref >60)
Glucose, Bld: 98 mg/dL (ref 70–99)
Potassium: 4.4 mmol/L (ref 3.5–5.1)
Sodium: 136 mmol/L (ref 135–145)
Total Bilirubin: 0.3 mg/dL (ref 0.3–1.2)
Total Protein: 7.8 g/dL (ref 6.5–8.1)

## 2022-07-22 LAB — SAMPLE TO BLOOD BANK

## 2022-07-22 MED ORDER — SODIUM CHLORIDE 0.9 % IV SOLN
200.0000 mg | Freq: Once | INTRAVENOUS | Status: AC
  Administered 2022-07-22: 200 mg via INTRAVENOUS
  Filled 2022-07-22: qty 200

## 2022-07-22 MED ORDER — SODIUM CHLORIDE 0.9% FLUSH
10.0000 mL | Freq: Once | INTRAVENOUS | Status: AC
  Administered 2022-07-22: 10 mL

## 2022-07-22 MED ORDER — HEPARIN SOD (PORK) LOCK FLUSH 100 UNIT/ML IV SOLN
500.0000 [IU] | Freq: Once | INTRAVENOUS | Status: AC | PRN
  Administered 2022-07-22: 500 [IU]

## 2022-07-22 MED ORDER — SODIUM CHLORIDE 0.9% FLUSH
10.0000 mL | INTRAVENOUS | Status: DC | PRN
  Administered 2022-07-22: 10 mL

## 2022-07-22 MED ORDER — SODIUM CHLORIDE 0.9 % IV SOLN: Freq: Once | INTRAVENOUS | Status: AC

## 2022-07-22 NOTE — Patient Instructions (Signed)
[facility]  Discharge Instructions: Thank you for choosing [facility] to provide your oncology and hematology care.   If you have a lab appointment with the [facility], please go directly to the [facility] and check in at the registration area.   Wear comfortable clothing and clothing appropriate for easy access to any Portacath or PICC line.   We strive to give you quality time with your provider. You may need to reschedule your appointment if you arrive late (15 or more minutes).  Arriving late affects you and other patients whose appointments are after yours.  Also, if you miss three or more appointments without notifying the office, you may be dismissed from the clinic at the provider's discretion.      For prescription refill requests, have your pharmacy contact our office and allow 72 hours for refills to be completed.    Today you received the following chemotherapy and/or immunotherapy agents: pembrolizumab Beryle Flock)      To help prevent nausea and vomiting after your treatment, we encourage you to take your nausea medication as directed.  BELOW ARE SYMPTOMS THAT SHOULD BE REPORTED IMMEDIATELY: *FEVER GREATER THAN 100.4 F (38 C) OR HIGHER *CHILLS OR SWEATING *NAUSEA AND VOMITING THAT IS NOT CONTROLLED WITH YOUR NAUSEA MEDICATION *UNUSUAL SHORTNESS OF BREATH *UNUSUAL BRUISING OR BLEEDING *URINARY PROBLEMS (pain or burning when urinating, or frequent urination) *BOWEL PROBLEMS (unusual diarrhea, constipation, pain near the anus) TENDERNESS IN MOUTH AND THROAT WITH OR WITHOUT PRESENCE OF ULCERS (sore throat, sores in mouth, or a toothache) UNUSUAL RASH, SWELLING OR PAIN  UNUSUAL VAGINAL DISCHARGE OR ITCHING   Items with * indicate a potential emergency and should be followed up as soon as possible or go to the Emergency Department if any problems should occur.  Please show the CHEMOTHERAPY ALERT CARD or IMMUNOTHERAPY ALERT CARD  at check-in to the Emergency Department and triage nurse.  Should you have questions after your visit or need to cancel or reschedule your appointment, please contact [facility]  Dept: [phone]  and follow the prompts.  Office hours are 8:00 a.m. to 4:30 p.m. Monday - Friday. Please note that voicemails left after 4:00 p.m. may not be returned until the following business day.  We are closed weekends and major holidays. You have access to a nurse at all times for urgent questions. Please call the main number to the clinic Dept: [phone] and follow the prompts.   For any non-urgent questions, you may also contact your provider using MyChart. We now offer e-Visits for anyone 65 and older to request care online for non-urgent symptoms. For details visit [email or web address].   Also download the MyChart app! Go to the app store, search "MyChart", open the app, select [facility], and log in with your MyChart username and password.  Masks are optional in the cancer centers. If you would like for your care team to wear a mask while they are taking care of you, please let them know. For doctor visits, patients may have with them one support person who is at least 65 years old. At this time, visitors are not allowed in the infusion area.Pembrolizumab injection What is this medication? PEMBROLIZUMAB (pem broe liz ue mab) is a monoclonal antibody. It is used to treat certain types of cancer. This medicine may be used for other purposes; ask your health care provider or pharmacist if you have questions. COMMON BRAND NAME(S): Keytruda What should I  tell my care team before I take this medication? They need to know if you have any of these conditions: autoimmune diseases like Crohn's disease, ulcerative colitis, or lupus have had or planning to have an allogeneic stem cell transplant (uses someone else's stem cells) history of organ transplant history of chest  radiation nervous system problems like myasthenia gravis or Guillain-Barre syndrome an unusual or allergic reaction to pembrolizumab, other medicines, foods, dyes, or preservatives pregnant or trying to get pregnant breast-feeding How should I use this medication? This medicine is for infusion into a vein. It is given by a health care professional in a hospital or clinic setting. A special MedGuide will be given to you before each treatment. Be sure to read this information carefully each time. Talk to your pediatrician regarding the use of this medicine in children. While this drug may be prescribed for children as young as 6 months for selected conditions, precautions do apply. Overdosage: If you think you have taken too much of this medicine contact a poison control center or emergency room at once. NOTE: This medicine is only for you. Do not share this medicine with others. What if I miss a dose? It is important not to miss your dose. Call your doctor or health care professional if you are unable to keep an appointment. What may interact with this medication? Interactions have not been studied. This list may not describe all possible interactions. Give your health care provider a list of all the medicines, herbs, non-prescription drugs, or dietary supplements you use. Also tell them if you smoke, drink alcohol, or use illegal drugs. Some items may interact with your medicine. What should I watch for while using this medication? Your condition will be monitored carefully while you are receiving this medicine. You may need blood work done while you are taking this medicine. Do not become pregnant while taking this medicine or for 4 months after stopping it. Women should inform their doctor if they wish to become pregnant or think they might be pregnant. There is a potential for serious side effects to an unborn child. Talk to your health care professional or pharmacist for more information. Do  not breast-feed an infant while taking this medicine or for 4 months after the last dose. What side effects may I notice from receiving this medication? Side effects that you should report to your doctor or health care professional as soon as possible: allergic reactions like skin rash, itching or hives, swelling of the face, lips, or tongue bloody or black, tarry breathing problems changes in vision chest pain chills confusion constipation cough diarrhea dizziness or feeling faint or lightheaded fast or irregular heartbeat fever flushing joint pain low blood counts - this medicine may decrease the number of white blood cells, red blood cells and platelets. You may be at increased risk for infections and bleeding. muscle pain muscle weakness pain, tingling, numbness in the hands or feet persistent headache redness, blistering, peeling or loosening of the skin, including inside the mouth signs and symptoms of high blood sugar such as dizziness; dry mouth; dry skin; fruity breath; nausea; stomach pain; increased hunger or thirst; increased urination signs and symptoms of kidney injury like trouble passing urine or change in the amount of urine signs and symptoms of liver injury like dark urine, light-colored stools, loss of appetite, nausea, right upper belly pain, yellowing of the eyes or skin sweating swollen lymph nodes weight loss Side effects that usually do not require medical attention (report  to your doctor or health care professional if they continue or are bothersome): decreased appetite hair loss tiredness This list may not describe all possible side effects. Call your doctor for medical advice about side effects. You may report side effects to FDA at 1-800-FDA-1088. Where should I keep my medication? This drug is given in a hospital or clinic and will not be stored at home. NOTE: This sheet is a summary. It may not cover all possible information. If you have questions  about this medicine, talk to your doctor, pharmacist, or health care provider.  2023 Elsevier/Gold Standard (2021-10-31 00:00:00)

## 2022-07-22 NOTE — Progress Notes (Signed)
OFF PATHWAY REGIMEN - Bladder  No Change  Continue With Treatment as Ordered.  Original Decision Date/Time: 06/23/2022 07:29   [id number]:Pembrolizumab 200 mg IV D1 q21 Days:   A cycle is every 21 days:     Pembrolizumab   **Always confirm dose/schedule in your pharmacy ordering system**  Patient Characteristics: Advanced/Metastatic Disease, Third Line and Beyond Therapeutic Status: Advanced/Metastatic Disease Line of Therapy: Third Line and Beyond  Intent of Therapy: Non-Curative / Palliative Intent, Discussed with Patient

## 2022-07-22 NOTE — Progress Notes (Signed)
Denies anyHematology and Oncology Follow Up Visit  Mary Stark [id number] January 12, 1956 66 y.o. 07/22/2022 12:00 PM Mary Stark, Mary Mutters, NP   [name]: 66 year old woman with bladder cancer diagnosed in November 2021.  She presented stage IV  disease with pelvic adenopathy, PD-L1 CPS score of 35.   Prior Therapy:  She is status post retroperitoneal lymph node biopsy completed on 10/24/2020 which showed metastatic carcinoma.  She is status post repeat cystoscopy and bilateral ureteral stent exchange as well as resection of a bladder tumor on January 21, 2021.  A final pathology showed an adenocarcinoma.  Chemotherapy utilizing carboplatin and gemcitabine started on November 22, 2020.  She completed 6 cycles of therapy on March 06, 2021.  FOLFOX chemotherapy started on Apr 15, 2021.  She completed 12 cycles of therapy.  She is status post radiation therapy to a right psoas metastatic lesion. She will complete 50 Gray in 5 fractions on February 27, 2022.  5-FU and leucovorin chemotherapy every 2 weeks started in November 2022.  She received maintenance therapy till June 2023 due to progression of disease.  She is status post right hemicolectomy completed on July 25, 2022.  The final pathology carcinoma that is a similar to her GU primary.  She received 1 cycle of Taxol chemotherapy on June 10, 2022.  Current therapy: Pembrolizumab 200 milligrams every 3 weeks started on July 01, 2022.   Interim History: Mary Stark returns today for repeat evaluation.  Since last visit, she reports feeling well and continues to improve clinically.  She denies any nausea, vomiting or abdominal pain.  He is eating better and has gained some weight.  He denies any bone pain or pathological fractures.  She did report some myalgias for 2 days after Pembrolizumab.  She denies any complications related to this medication at this time.  She denies any diarrhea or shortness of  breath.    Medications: Reviewed without changes. Current Outpatient Medications  Medication Sig Dispense Refill   albuterol (PROAIR HFA) 108 (90 Base) MCG/ACT inhaler INHALE 1 TO 2 PUFFS BY MOUTH INTO THE LUNGS EVERY 4 HOURS AS NEEDED FOR WHEEZING OR SHORTNESS OF BREATH 8 g 1   ARIPiprazole (ABILIFY) 10 MG tablet TAKE 1 TABLET BY MOUTH ONCE DAILY 90 tablet 0   azelastine (ASTELIN) 0.1 % nasal spray Place 2 sprays into both nostrils twice daily as needed for allergies as directed 30 mL 5   bisacodyl (DULCOLAX) 5 MG EC tablet Take 2 tablets by mouth twice a day. (Patient taking differently: Take 10 mg by mouth 2 (two) times daily.) 4 tablet 0   bisacodyl (DULCOLAX) 5 MG EC tablet Take 2 tablets (10 mg total) by mouth 2 (two) times daily. 4 tablet 0   buPROPion (WELLBUTRIN XL) 300 MG 24 hr tablet TAKE 1 TABLET BY MOUTH EVERY DAY 90 tablet 1   cephALEXin (KEFLEX) 250 MG capsule Take 1 capsule by mouth nightly as directed (Patient taking differently: Take 250 mg by mouth at bedtime. Continuous therapy due to 2 urinary stents) 90 capsule 1   clonazePAM (KLONOPIN) 1 MG tablet Take 1 tablet (1 mg total) by mouth at bedtime. 30 tablet 2   cycloSPORINE (RESTASIS) 0.05 % ophthalmic emulsion Place 1 drop both eyes twice a day (Patient taking differently: Place 1 drop into both eyes daily.) 180 each 3   doxycycline (VIBRA-TABS) 100 MG tablet Take 1 tablet (100 mg total) by mouth 2 (two) times daily for 7 days 14 tablet 0  fexofenadine (ALLEGRA) 180 MG tablet Take 1 tablet (180 mg total) by mouth daily. 90 tablet 3   gabapentin (NEURONTIN) 300 MG capsule Take 1 capsule by mouth daily and 4 capsules at bedtime as directed. (Patient taking differently: Take 300-1,200 mg by mouth See admin instructions. Take 1 capsule by mouth in the morning then take 3 capsules at night) 450 capsule 1   HYDROcodone-acetaminophen (NORCO) 5-325 MG tablet Take 1 tablet by mouth every 6 (six) hours as needed for severe pain. 30  tablet 0   lidocaine-prilocaine (EMLA) cream Apply 1 application topically as needed. (Patient taking differently: Apply 1 application  topically as needed (for port access).) 30 g 0   Meth-Hyo-M Bl-Na Phos-Ph Sal (URIBEL) 118 MG CAPS Take one capsule by mouth 3 times daily (Patient not taking: Reported on 05/23/2022) 30 capsule 3   mirabegron ER (MYRBETRIQ) 25 MG TB24 tablet TAKE 1 TABLET BY MOUTH ONCE A DAY 30 tablet 3   mirabegron ER (MYRBETRIQ) 50 MG TB24 tablet Take 1 tablet by mouth daily. 30 tablet 3   Nebivolol HCl 20 MG TABS Take 1/2 tablet (10 mg total) by mouth daily. (Patient not taking: Reported on 05/23/2022) 90 tablet 1   omeprazole (PRILOSEC) 20 MG capsule Take 1 capsule (20 mg total) by mouth daily. 90 capsule 3   ondansetron (ZOFRAN) 4 MG tablet Take 1 tablet (4 mg total) by mouth every 8 (eight) hours as needed for nausea or vomiting. 40 tablet 3   oxymetazoline (AFRIN) 0.05 % nasal spray Place 1 spray into both nostrils 2 (two) times daily as needed for congestion. For nose bleeds     polyethylene glycol-electrolytes (NULYTELY) 420 g solution Use as directed 4000 mL 0   polyethylene glycol-electrolytes (NULYTELY) 420 g solution Take as directed over 2 days. 4000 mL 0   sertraline (ZOLOFT) 25 MG tablet Take 1 tablet (25 mg total) by mouth daily. 90 tablet 1   simvastatin (ZOCOR) 20 MG tablet TAKE 1 TABLET (20 MG TOTAL) BY MOUTH DAILY. (Patient taking differently: Take 20 mg by mouth at bedtime.) 90 tablet 3   tamsulosin (FLOMAX) 0.4 MG CAPS capsule TAKE 1 CAPSULE BY MOUTH AT BEDTIME 30 capsule 3   No current facility-administered medications for this visit.     Allergies:  Allergies  Allergen Reactions   Metformin And Related Other (See Comments)    Lactic acidosis    Penicillins Hives and Rash    Reports hives to penicillin and amoxicillin as an adult "a long time ago." Has tolerated cefdinir (09/2020) and cefepime (10/2020) before.   Metformin Diarrhea    Extreme  diarrhea and lactic acidosis   Penicillin G Sodium Hives and Rash      Physical Exam:       Blood pressure (!) 165/73, pulse 66, temperature 97.8 F (36.6 C), temperature source Temporal, resp. rate 17, height '5\' 6"'  (1.676 m), weight 157 lb 12.8 oz (71.6 kg), SpO2 99 %.           ECOG: 1   General appearance: Alert, awake without any distress. Head: Atraumatic without abnormalities Oropharynx: Without any thrush or ulcers. Eyes: No scleral icterus. Lymph nodes: No lymphadenopathy noted in the cervical, supraclavicular, or axillary nodes Heart:regular rate and rhythm, without any murmurs or gallops.   Lung: Clear to auscultation without any rhonchi, wheezes or dullness to percussion. Abdomin: Soft, nontender without any shifting dullness or ascites. Musculoskeletal: No clubbing or cyanosis. Neurological: No motor or sensory deficits. Skin: No rashes  or lesions.                         Lab Results: Lab Results  Component Value Date   WBC 6.9 07/22/2022   HGB 9.1 (L) 07/22/2022   HCT 28.3 (L) 07/22/2022   MCV 101.8 (H) 07/22/2022   PLT 207 07/22/2022     Chemistry      Component Value Date/Time   NA 138 07/01/2022 0856   NA 142 04/26/2018 1333   K 4.0 07/01/2022 0856   CL 108 07/01/2022 0856   CO2 24 07/01/2022 0856   BUN 28 (H) 07/01/2022 0856   BUN 22 04/26/2018 1333   CREATININE 1.05 (H) 07/01/2022 0856   CREATININE 1.32 (H) 06/04/2021 0000   GLU 87 02/07/2013 0000      Component Value Date/Time   CALCIUM 9.4 07/01/2022 0856   ALKPHOS 70 07/01/2022 0856   AST 15 07/01/2022 0856   ALT 10 07/01/2022 0856   BILITOT 0.3 07/01/2022 0856        Impression and Plan:    66 year old woman with:   1.   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     The natural course of her disease was reviewed at this time and treatment choices were discussed.  Risks and benefits of continuing this  treatment were reiterated.  She has tolerated Pembrolizumab without any major complaints and willing to continue.  Complications that include autoimmune issues, GI toxicity among others were reviewed and has experienced very little issues.  Plan is to update her treatment after cycle 4 and she is willing to continue.  2.  IV access: Port-A-Cath remains in use without any issues.  3.  Antiemetics: No nausea or vomiting currently noted.  Compazine is available to her.   4.  Renal function surveillance: Her kidney function continues to be close to normal range at this time.   5.  Goals of care: Therapy is palliative though aggressive measures are warranted given her reasonable performance status.  6.  Anemia: Due to malignancy and chemotherapy.  Hemoglobin is adequate and does not require any intervention.  7.  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  8.  Autoimmune considerations: She has not experienced any issues including pneumonitis, colitis and thyroid disease.  Will continue to monitor.   9.  Follow-up: She will return in 3 weeks for a follow-up.   30  minutes were dedicated to this encounter.  Time spent on reviewing laboratory data, disease status update and outlining future plan of care discussion.  Zola Button, MD 8/9/202312:00 PM

## 2022-07-23 ENCOUNTER — Other Ambulatory Visit (HOSPITAL_COMMUNITY): Payer: Self-pay

## 2022-07-23 LAB — TSH: TSH: 2.389 u[IU]/mL (ref 0.350–4.500)

## 2022-07-24 ENCOUNTER — Other Ambulatory Visit: Payer: No Typology Code available for payment source

## 2022-07-24 ENCOUNTER — Ambulatory Visit: Payer: No Typology Code available for payment source

## 2022-07-24 ENCOUNTER — Other Ambulatory Visit (HOSPITAL_COMMUNITY): Payer: Self-pay

## 2022-07-24 MED ORDER — CEPHALEXIN 250 MG PO CAPS
ORAL_CAPSULE | ORAL | 0 refills | Status: DC
  Filled 2022-07-24: qty 90, 90d supply, fill #0

## 2022-07-25 ENCOUNTER — Other Ambulatory Visit: Payer: Self-pay | Admitting: Oncology

## 2022-07-26 ENCOUNTER — Other Ambulatory Visit: Payer: Self-pay | Admitting: Oncology

## 2022-07-27 ENCOUNTER — Other Ambulatory Visit (HOSPITAL_COMMUNITY): Payer: Self-pay

## 2022-07-30 ENCOUNTER — Other Ambulatory Visit: Payer: Self-pay | Admitting: Medical-Surgical

## 2022-07-31 ENCOUNTER — Other Ambulatory Visit (HOSPITAL_COMMUNITY): Payer: Self-pay

## 2022-08-04 ENCOUNTER — Other Ambulatory Visit (HOSPITAL_COMMUNITY): Payer: Self-pay

## 2022-08-04 MED ORDER — CLONAZEPAM 1 MG PO TABS
1.0000 mg | ORAL_TABLET | Freq: Every day | ORAL | 0 refills | Status: DC
  Filled 2022-08-04: qty 30, 30d supply, fill #0

## 2022-08-04 NOTE — Telephone Encounter (Signed)
Last office visit 05/19/2022  Last filled 04/17/2022  Follow up appointment 08/18/2022

## 2022-08-05 ENCOUNTER — Other Ambulatory Visit (HOSPITAL_COMMUNITY): Payer: Self-pay

## 2022-08-07 ENCOUNTER — Other Ambulatory Visit: Payer: Self-pay | Admitting: Urology

## 2022-08-10 ENCOUNTER — Ambulatory Visit (INDEPENDENT_AMBULATORY_CARE_PROVIDER_SITE_OTHER): Payer: No Typology Code available for payment source

## 2022-08-10 ENCOUNTER — Ambulatory Visit (INDEPENDENT_AMBULATORY_CARE_PROVIDER_SITE_OTHER): Payer: No Typology Code available for payment source | Admitting: Sports Medicine

## 2022-08-10 ENCOUNTER — Other Ambulatory Visit (HOSPITAL_COMMUNITY): Payer: Self-pay

## 2022-08-10 DIAGNOSIS — M170 Bilateral primary osteoarthritis of knee: Secondary | ICD-10-CM

## 2022-08-10 MED ORDER — HYDROCODONE-ACETAMINOPHEN 5-325 MG PO TABS
1.0000 | ORAL_TABLET | Freq: Four times a day (QID) | ORAL | 0 refills | Status: DC | PRN
Start: 2022-08-10 — End: 2022-10-16

## 2022-08-10 MED ORDER — TRAMADOL HCL 50 MG PO TABS
50.0000 mg | ORAL_TABLET | Freq: Two times a day (BID) | ORAL | 3 refills | Status: DC | PRN
  Filled 2022-08-10: qty 60, 30d supply, fill #0

## 2022-08-10 NOTE — Progress Notes (Addendum)
    Procedures performed today:    Procedure: Real-time Ultrasound Guided injection of the left knee Device: Samsung [id number]  Verbal informed consent obtained.  Time-out conducted.  Noted no overlying erythema, induration, or other signs of local infection.  Skin prepped in a sterile fashion.  Local anesthesia: Topical Ethyl chloride.  With sterile technique and under real time ultrasound guidance: noted trace effusion, 1 cc Kenalog 40, 2 cc lidocaine, 2 cc bupivacaine injected easily Completed without difficulty  Advised to call if fevers/chills, erythema, induration, drainage, or persistent bleeding.  Images permanently stored and available for review in PACS.  Impression: Technically successful ultrasound guided injection.  Independent interpretation of notes and tests performed by another provider:   None.  Brief History, Exam, Impression, and Recommendations:    Primary osteoarthritis of both knees This is a very pleasant 66 year old female, bilateral knee osteoarthritis, last injection was in March 2023. She has done well since. She is doing chemotherapy, she is having recurrence of left knee pain, minimal swelling, right knee is doing okay. I injected her left knee today, return to see me as needed. She does have stage IV cancer. Adding some Vicodin for short-term use and then tramadol for longer term use.    ____________________________________________ Gwen Her. Dianah Field, M.D., ABFM., CAQSM., AME. Primary Care and Sports Medicine [facility]  Adjunct Professor of [place] [employer]  Risk manager

## 2022-08-10 NOTE — Assessment & Plan Note (Addendum)
This is a very pleasant 66 year old female, bilateral knee osteoarthritis, last injection was in March 2023. She has done well since. She is doing chemotherapy, she is having recurrence of left knee pain, minimal swelling, right knee is doing okay. I injected her left knee today, return to see me as needed. She does have stage IV cancer. Adding some Vicodin for short-term use and then tramadol for longer term use.

## 2022-08-12 ENCOUNTER — Inpatient Hospital Stay (HOSPITAL_BASED_OUTPATIENT_CLINIC_OR_DEPARTMENT_OTHER): Payer: No Typology Code available for payment source | Admitting: Oncology

## 2022-08-12 ENCOUNTER — Other Ambulatory Visit: Payer: Self-pay

## 2022-08-12 ENCOUNTER — Inpatient Hospital Stay: Payer: No Typology Code available for payment source

## 2022-08-12 ENCOUNTER — Inpatient Hospital Stay (HOSPITAL_BASED_OUTPATIENT_CLINIC_OR_DEPARTMENT_OTHER): Payer: No Typology Code available for payment source | Admitting: Nurse Practitioner

## 2022-08-12 ENCOUNTER — Encounter: Payer: Self-pay | Admitting: Nurse Practitioner

## 2022-08-12 VITALS — BP 151/89 | HR 65 | Temp 97.6°F | Resp 15 | Wt 158.3 lb

## 2022-08-12 VITALS — BP 148/95 | HR 65 | Temp 97.5°F | Resp 14 | Wt 158.8 lb

## 2022-08-12 DIAGNOSIS — C799 Secondary malignant neoplasm of unspecified site: Secondary | ICD-10-CM

## 2022-08-12 DIAGNOSIS — Z95828 Presence of other vascular implants and grafts: Secondary | ICD-10-CM

## 2022-08-12 DIAGNOSIS — C669 Malignant neoplasm of unspecified ureter: Secondary | ICD-10-CM

## 2022-08-12 DIAGNOSIS — Z515 Encounter for palliative care: Secondary | ICD-10-CM | POA: Diagnosis not present

## 2022-08-12 DIAGNOSIS — C801 Malignant (primary) neoplasm, unspecified: Secondary | ICD-10-CM | POA: Diagnosis not present

## 2022-08-12 DIAGNOSIS — R530 Neoplastic (malignant) related fatigue: Secondary | ICD-10-CM | POA: Diagnosis not present

## 2022-08-12 DIAGNOSIS — C679 Malignant neoplasm of bladder, unspecified: Secondary | ICD-10-CM

## 2022-08-12 DIAGNOSIS — Z5112 Encounter for antineoplastic immunotherapy: Secondary | ICD-10-CM | POA: Diagnosis not present

## 2022-08-12 DIAGNOSIS — Z7189 Other specified counseling: Secondary | ICD-10-CM

## 2022-08-12 LAB — CBC WITH DIFFERENTIAL (CANCER CENTER ONLY)
Abs Immature Granulocytes: 0.05 10*3/uL (ref 0.00–0.07)
Basophils Absolute: 0 10*3/uL (ref 0.0–0.1)
Basophils Relative: 0 %
Eosinophils Absolute: 0 10*3/uL (ref 0.0–0.5)
Eosinophils Relative: 0 %
HCT: 31.8 % — ABNORMAL LOW (ref 36.0–46.0)
Hemoglobin: 10.3 g/dL — ABNORMAL LOW (ref 12.0–15.0)
Immature Granulocytes: 1 %
Lymphocytes Relative: 17 %
Lymphs Abs: 1.6 10*3/uL (ref 0.7–4.0)
MCH: 32.4 pg (ref 26.0–34.0)
MCHC: 32.4 g/dL (ref 30.0–36.0)
MCV: 100 fL (ref 80.0–100.0)
Monocytes Absolute: 0.7 10*3/uL (ref 0.1–1.0)
Monocytes Relative: 7 %
Neutro Abs: 7.5 10*3/uL (ref 1.7–7.7)
Neutrophils Relative %: 75 %
Platelet Count: 196 10*3/uL (ref 150–400)
RBC: 3.18 MIL/uL — ABNORMAL LOW (ref 3.87–5.11)
RDW: 14.5 % (ref 11.5–15.5)
WBC Count: 9.8 10*3/uL (ref 4.0–10.5)
nRBC: 0 % (ref 0.0–0.2)

## 2022-08-12 LAB — CMP (CANCER CENTER ONLY)
ALT: 10 U/L (ref 0–44)
AST: 13 U/L — ABNORMAL LOW (ref 15–41)
Albumin: 4.5 g/dL (ref 3.5–5.0)
Alkaline Phosphatase: 65 U/L (ref 38–126)
Anion gap: 6 (ref 5–15)
BUN: 51 mg/dL — ABNORMAL HIGH (ref 8–23)
CO2: 22 mmol/L (ref 22–32)
Calcium: 9.2 mg/dL (ref 8.9–10.3)
Chloride: 106 mmol/L (ref 98–111)
Creatinine: 1.35 mg/dL — ABNORMAL HIGH (ref 0.44–1.00)
GFR, Estimated: 44 mL/min — ABNORMAL LOW (ref >60)
Glucose, Bld: 129 mg/dL — ABNORMAL HIGH (ref 70–99)
Potassium: 4.6 mmol/L (ref 3.5–5.1)
Sodium: 134 mmol/L — ABNORMAL LOW (ref 135–145)
Total Bilirubin: 0.2 mg/dL — ABNORMAL LOW (ref 0.3–1.2)
Total Protein: 7.6 g/dL (ref 6.5–8.1)

## 2022-08-12 LAB — SAMPLE TO BLOOD BANK

## 2022-08-12 LAB — TSH: TSH: 1.575 u[IU]/mL (ref 0.350–4.500)

## 2022-08-12 MED ORDER — HEPARIN SOD (PORK) LOCK FLUSH 100 UNIT/ML IV SOLN
500.0000 [IU] | Freq: Once | INTRAVENOUS | Status: AC | PRN
  Administered 2022-08-12: 500 [IU]

## 2022-08-12 MED ORDER — SODIUM CHLORIDE 0.9 % IV SOLN: Freq: Once | INTRAVENOUS | Status: AC

## 2022-08-12 MED ORDER — SODIUM CHLORIDE 0.9% FLUSH
10.0000 mL | INTRAVENOUS | Status: DC | PRN
  Administered 2022-08-12: 10 mL

## 2022-08-12 MED ORDER — SODIUM CHLORIDE 0.9 % IV SOLN
200.0000 mg | Freq: Once | INTRAVENOUS | Status: AC
  Administered 2022-08-12: 200 mg via INTRAVENOUS
  Filled 2022-08-12: qty 200

## 2022-08-12 MED ORDER — SODIUM CHLORIDE 0.9% FLUSH
10.0000 mL | Freq: Once | INTRAVENOUS | Status: AC
  Administered 2022-08-12: 10 mL

## 2022-08-12 NOTE — Progress Notes (Signed)
Denies anyHematology and Oncology Follow Up Visit  Mary Stark [id number] 1956-05-17 66 y.o. 08/12/2022 8:50 AM Mary Fiscal, NP   [name]: 66 year old woman with stage IV adenocarcinoma of the bladder with pelvic adenopathy diagnosed in November 2021.  She was found to have PD-L1 CPS score of 35.   Prior Therapy:  She is status post retroperitoneal lymph node biopsy completed on 10/24/2020 which showed metastatic carcinoma.  She is status post repeat cystoscopy and bilateral ureteral stent exchange as well as resection of a bladder tumor on January 21, 2021.  A final pathology showed an adenocarcinoma.  Chemotherapy utilizing carboplatin and gemcitabine started on November 22, 2020.  She completed 6 cycles of therapy on March 06, 2021.  FOLFOX chemotherapy started on Apr 15, 2021.  She completed 12 cycles of therapy.  She is status post radiation therapy to a right psoas metastatic lesion. She will complete 50 Gray in 5 fractions on February 27, 2022.  5-FU and leucovorin chemotherapy every 2 weeks started in November 2022.  She received maintenance therapy till June 2023 due to progression of disease.  She is status post right hemicolectomy completed on July 25, 2022.  The final pathology carcinoma that is a similar to her GU primary.  She received 1 cycle of Taxol chemotherapy on June 10, 2022.  Current therapy: Pembrolizumab 200 milligrams every 3 weeks started on July 01, 2022.  He is here for cycle 3 of therapy.  Interim History: Mary Stark presents today for return evaluation.  Since last visit, she reports feeling well without any major complaints.  She has tolerated treatment without any new concerns.  She denies any nausea, vomiting or abdominal pain.  She denies any worsening diarrhea.  She denies any cough or shortness of breath.  She denies any skin rashes or lesions.    Medications: Updated on review. Current Outpatient Medications   Medication Sig Dispense Refill   albuterol (PROAIR HFA) 108 (90 Base) MCG/ACT inhaler INHALE 1 TO 2 PUFFS BY MOUTH INTO THE LUNGS EVERY 4 HOURS AS NEEDED FOR WHEEZING OR SHORTNESS OF BREATH 8 g 1   ARIPiprazole (ABILIFY) 10 MG tablet TAKE 1 TABLET BY MOUTH ONCE DAILY 90 tablet 0   azelastine (ASTELIN) 0.1 % nasal spray Place 2 sprays into both nostrils twice daily as needed for allergies as directed 30 mL 5   bisacodyl (DULCOLAX) 5 MG EC tablet Take 2 tablets by mouth twice a day. (Patient taking differently: Take 10 mg by mouth 2 (two) times daily.) 4 tablet 0   bisacodyl (DULCOLAX) 5 MG EC tablet Take 2 tablets (10 mg total) by mouth 2 (two) times daily. 4 tablet 0   buPROPion (WELLBUTRIN XL) 300 MG 24 hr tablet TAKE 1 TABLET BY MOUTH EVERY DAY 90 tablet 1   cephALEXin (KEFLEX) 250 MG capsule Take 1 capsule by mouth nightly as directed (Patient taking differently: Take 250 mg by mouth at bedtime. Continuous therapy due to 2 urinary stents) 90 capsule 1   cephALEXin (KEFLEX) 250 MG capsule Take 1 capsule by mouth at bedtime 90 capsule 0   clonazePAM (KLONOPIN) 1 MG tablet Take 1 tablet (1 mg total) by mouth at bedtime. 30 tablet 0   cycloSPORINE (RESTASIS) 0.05 % ophthalmic emulsion Place 1 drop both eyes twice a day (Patient taking differently: Place 1 drop into both eyes daily.) 180 each 3   doxycycline (VIBRA-TABS) 100 MG tablet Take 1 tablet (100 mg total) by mouth 2 (two) times  daily for 7 days 14 tablet 0   fexofenadine (ALLEGRA) 180 MG tablet Take 1 tablet (180 mg total) by mouth daily. 90 tablet 3   gabapentin (NEURONTIN) 300 MG capsule Take 1 capsule by mouth daily and 4 capsules at bedtime as directed. (Patient taking differently: Take 300-1,200 mg by mouth See admin instructions. Take 1 capsule by mouth in the morning then take 3 capsules at night) 450 capsule 1   HYDROcodone-acetaminophen (NORCO) 5-325 MG tablet Take 1 tablet by mouth every 6 (six) hours as needed for severe pain. 30  tablet 0   lidocaine-prilocaine (EMLA) cream Apply 1 application topically as needed. (Patient taking differently: Apply 1 application  topically as needed (for port access).) 30 g 0   Meth-Hyo-M Bl-Na Phos-Ph Sal (URIBEL) 118 MG CAPS Take one capsule by mouth 3 times daily (Patient not taking: Reported on 05/23/2022) 30 capsule 3   mirabegron ER (MYRBETRIQ) 25 MG TB24 tablet TAKE 1 TABLET BY MOUTH ONCE A DAY 30 tablet 3   mirabegron ER (MYRBETRIQ) 50 MG TB24 tablet Take 1 tablet by mouth daily. 30 tablet 3   Nebivolol HCl 20 MG TABS Take 1/2 tablet (10 mg total) by mouth daily. (Patient not taking: Reported on 05/23/2022) 90 tablet 1   omeprazole (PRILOSEC) 20 MG capsule Take 1 capsule (20 mg total) by mouth daily. 90 capsule 3   ondansetron (ZOFRAN) 4 MG tablet Take 1 tablet (4 mg total) by mouth every 8 (eight) hours as needed for nausea or vomiting. 40 tablet 3   oxymetazoline (AFRIN) 0.05 % nasal spray Place 1 spray into both nostrils 2 (two) times daily as needed for congestion. For nose bleeds     polyethylene glycol-electrolytes (NULYTELY) 420 g solution Use as directed 4000 mL 0   polyethylene glycol-electrolytes (NULYTELY) 420 g solution Take as directed over 2 days. 4000 mL 0   sertraline (ZOLOFT) 25 MG tablet Take 1 tablet (25 mg total) by mouth daily. 90 tablet 1   simvastatin (ZOCOR) 20 MG tablet TAKE 1 TABLET (20 MG TOTAL) BY MOUTH DAILY. (Patient taking differently: Take 20 mg by mouth at bedtime.) 90 tablet 3   tamsulosin (FLOMAX) 0.4 MG CAPS capsule TAKE 1 CAPSULE BY MOUTH AT BEDTIME 30 capsule 3   traMADol (ULTRAM) 50 MG tablet Take 1 tablet (50 mg total) by mouth every 12 (twelve) hours as needed for moderate pain. 60 tablet 3   No current facility-administered medications for this visit.   Facility-Administered Medications Ordered in Other Visits  Medication Dose Route Frequency Provider Last Rate Last Admin   sodium chloride flush (NS) 0.9 % injection 10 mL  10 mL  Intracatheter Once Mary Portela, MD         Allergies:  Allergies  Allergen Reactions   Metformin And Related Other (See Comments)    Lactic acidosis    Penicillins Hives and Rash    Reports hives to penicillin and amoxicillin as an adult "a long time ago." Has tolerated cefdinir (09/2020) and cefepime (10/2020) before.   Metformin Diarrhea    Extreme diarrhea and lactic acidosis   Penicillin G Sodium Hives and Rash      Physical Exam:       Blood pressure (!) 151/89, pulse 65, temperature 97.6 F (36.4 C), temperature source Oral, resp. rate 15, weight 158 lb 4.8 oz (71.8 kg), SpO2 100 %.            ECOG: 1     General appearance: Comfortable  appearing without any discomfort Head: Normocephalic without any trauma Oropharynx: Mucous membranes are moist and pink without any thrush or ulcers. Eyes: Pupils are equal and round reactive to light. Lymph nodes: No cervical, supraclavicular, inguinal or axillary lymphadenopathy.   Heart:regular rate and rhythm.  S1 and S2 without leg edema. Lung: Clear without any rhonchi or wheezes.  No dullness to percussion. Abdomin: Soft, nontender, nondistended with good bowel sounds.  No hepatosplenomegaly. Musculoskeletal: No joint deformity or effusion.  Full range of motion noted. Neurological: No deficits noted on motor, sensory and deep tendon reflex exam. Skin: No petechial rash or dryness.  Appeared moist.                          Lab Results: Lab Results  Component Value Date   WBC 6.9 07/22/2022   HGB 9.1 (L) 07/22/2022   HCT 28.3 (L) 07/22/2022   MCV 101.8 (H) 07/22/2022   PLT 207 07/22/2022     Chemistry      Component Value Date/Time   NA 136 07/22/2022 1142   NA 142 04/26/2018 1333   K 4.4 07/22/2022 1142   CL 104 07/22/2022 1142   CO2 27 07/22/2022 1142   BUN 25 (H) 07/22/2022 1142   BUN 22 04/26/2018 1333   CREATININE 1.01 (H) 07/22/2022 1142   CREATININE 1.32 (H)  06/04/2021 0000   GLU 87 02/07/2013 0000      Component Value Date/Time   CALCIUM 9.0 07/22/2022 1142   ALKPHOS 67 07/22/2022 1142   AST 15 07/22/2022 1142   ALT 12 07/22/2022 1142   BILITOT 0.3 07/22/2022 1142        Impression and Plan:    66 year old woman with:   1.   Bladder cancer diagnosed in 2021.  She was found to have stage IV adenocarcinoma with pelvic adenopathy.  The natural course of her disease were reviewed at this time and treatment choices were discussed.  She has tolerated Pembrolizumab reasonably well I recommended continuing this treatment.  We will update her staging scan before the next visit.  Alternative treatment options will be chemotherapy including taxane-based regimens.  He is agreeable to proceed and we will update her scan before the next visit.  2.  IV access: Port-A-Cath currently in use without any issues.  3.  Antiemetics: Compazine is available to her without any nausea or vomiting.   4.  Renal function surveillance: Creatinine clearance is within normal range at this time.   5.  Goals of care: Her disease is incurable although aggressive measures are warranted given her reasonable performance status.  6.  Anemia: Related to malignancy and chemotherapy.  Hemoglobin is adequate today at 10.3 and does not require transfusion at this time.  7.  Colon obstruction: She is status post surgical resection.  This is related to tumor progression.  8.  Autoimmune considerations: We will continue to monitor for signs and symptoms including pneumonitis, colitis and thyroid disease.   9.  Follow-up: In 3 weeks for a follow-up visit.   30  minutes were spent on this visit.  Time was dedicated to reviewing her disease status, addressing complications related to cancer and cancer therapy.  Zola Button, MD 8/30/20238:50 AM

## 2022-08-12 NOTE — Patient Instructions (Signed)
[facility]  Discharge Instructions: Thank you for choosing [facility] to provide your oncology and hematology care.   If you have a lab appointment with the [facility], please go directly to the [facility] and check in at the registration area.   Wear comfortable clothing and clothing appropriate for easy access to any Portacath or PICC line.   We strive to give you quality time with your provider. You may need to reschedule your appointment if you arrive late (15 or more minutes).  Arriving late affects you and other patients whose appointments are after yours.  Also, if you miss three or more appointments without notifying the office, you may be dismissed from the clinic at the provider's discretion.      For prescription refill requests, have your pharmacy contact our office and allow 72 hours for refills to be completed.    Today you received the following chemotherapy and/or immunotherapy agents: pembrolizumab Beryle Flock)      To help prevent nausea and vomiting after your treatment, we encourage you to take your nausea medication as directed.  BELOW ARE SYMPTOMS THAT SHOULD BE REPORTED IMMEDIATELY: *FEVER GREATER THAN 100.4 F (38 C) OR HIGHER *CHILLS OR SWEATING *NAUSEA AND VOMITING THAT IS NOT CONTROLLED WITH YOUR NAUSEA MEDICATION *UNUSUAL SHORTNESS OF BREATH *UNUSUAL BRUISING OR BLEEDING *URINARY PROBLEMS (pain or burning when urinating, or frequent urination) *BOWEL PROBLEMS (unusual diarrhea, constipation, pain near the anus) TENDERNESS IN MOUTH AND THROAT WITH OR WITHOUT PRESENCE OF ULCERS (sore throat, sores in mouth, or a toothache) UNUSUAL RASH, SWELLING OR PAIN  UNUSUAL VAGINAL DISCHARGE OR ITCHING   Items with * indicate a potential emergency and should be followed up as soon as possible or go to the Emergency Department if any problems should occur.  Please show the CHEMOTHERAPY ALERT CARD or IMMUNOTHERAPY ALERT CARD  at check-in to the Emergency Department and triage nurse.  Should you have questions after your visit or need to cancel or reschedule your appointment, please contact [facility]  Dept: [phone]  and follow the prompts.  Office hours are 8:00 a.m. to 4:30 p.m. Monday - Friday. Please note that voicemails left after 4:00 p.m. may not be returned until the following business day.  We are closed weekends and major holidays. You have access to a nurse at all times for urgent questions. Please call the main number to the clinic Dept: [phone] and follow the prompts.   For any non-urgent questions, you may also contact your provider using MyChart. We now offer e-Visits for anyone 26 and older to request care online for non-urgent symptoms. For details visit [email or web address].   Also download the MyChart app! Go to the app store, search "MyChart", open the app, select [facility], and log in with your MyChart username and password.  Masks are optional in the cancer centers. If you would like for your care team to wear a mask while they are taking care of you, please let them know. For doctor visits, patients may have with them one support person who is at least 65 years old. At this time, visitors are not allowed in the infusion area.Pembrolizumab injection What is this medication? PEMBROLIZUMAB (pem broe liz ue mab) is a monoclonal antibody. It is used to treat certain types of cancer. This medicine may be used for other purposes; ask your health care provider or pharmacist if you have questions. COMMON BRAND NAME(S): Keytruda What should I  tell my care team before I take this medication? They need to know if you have any of these conditions: autoimmune diseases like Crohn's disease, ulcerative colitis, or lupus have had or planning to have an allogeneic stem cell transplant (uses someone else's stem cells) history of organ transplant history of chest  radiation nervous system problems like myasthenia gravis or Guillain-Barre syndrome an unusual or allergic reaction to pembrolizumab, other medicines, foods, dyes, or preservatives pregnant or trying to get pregnant breast-feeding How should I use this medication? This medicine is for infusion into a vein. It is given by a health care professional in a hospital or clinic setting. A special MedGuide will be given to you before each treatment. Be sure to read this information carefully each time. Talk to your pediatrician regarding the use of this medicine in children. While this drug may be prescribed for children as young as 6 months for selected conditions, precautions do apply. Overdosage: If you think you have taken too much of this medicine contact a poison control center or emergency room at once. NOTE: This medicine is only for you. Do not share this medicine with others. What if I miss a dose? It is important not to miss your dose. Call your doctor or health care professional if you are unable to keep an appointment. What may interact with this medication? Interactions have not been studied. This list may not describe all possible interactions. Give your health care provider a list of all the medicines, herbs, non-prescription drugs, or dietary supplements you use. Also tell them if you smoke, drink alcohol, or use illegal drugs. Some items may interact with your medicine. What should I watch for while using this medication? Your condition will be monitored carefully while you are receiving this medicine. You may need blood work done while you are taking this medicine. Do not become pregnant while taking this medicine or for 4 months after stopping it. Women should inform their doctor if they wish to become pregnant or think they might be pregnant. There is a potential for serious side effects to an unborn child. Talk to your health care professional or pharmacist for more information. Do  not breast-feed an infant while taking this medicine or for 4 months after the last dose. What side effects may I notice from receiving this medication? Side effects that you should report to your doctor or health care professional as soon as possible: allergic reactions like skin rash, itching or hives, swelling of the face, lips, or tongue bloody or black, tarry breathing problems changes in vision chest pain chills confusion constipation cough diarrhea dizziness or feeling faint or lightheaded fast or irregular heartbeat fever flushing joint pain low blood counts - this medicine may decrease the number of white blood cells, red blood cells and platelets. You may be at increased risk for infections and bleeding. muscle pain muscle weakness pain, tingling, numbness in the hands or feet persistent headache redness, blistering, peeling or loosening of the skin, including inside the mouth signs and symptoms of high blood sugar such as dizziness; dry mouth; dry skin; fruity breath; nausea; stomach pain; increased hunger or thirst; increased urination signs and symptoms of kidney injury like trouble passing urine or change in the amount of urine signs and symptoms of liver injury like dark urine, light-colored stools, loss of appetite, nausea, right upper belly pain, yellowing of the eyes or skin sweating swollen lymph nodes weight loss Side effects that usually do not require medical attention (report  to your doctor or health care professional if they continue or are bothersome): decreased appetite hair loss tiredness This list may not describe all possible side effects. Call your doctor for medical advice about side effects. You may report side effects to FDA at 1-800-FDA-1088. Where should I keep my medication? This drug is given in a hospital or clinic and will not be stored at home. NOTE: This sheet is a summary. It may not cover all possible information. If you have questions  about this medicine, talk to your doctor, pharmacist, or health care provider.  2023 Elsevier/Gold Standard (2021-10-31 00:00:00)

## 2022-08-12 NOTE — Progress Notes (Signed)
[facility]  Telephone:([phone]) [phone] Fax:(336) [phone]   Name: Mary Stark Date: 08/12/2022 MRN: [id number]  DOB: 1956-08-11  Patient Care Team: Mary Bouche, NP as PCP - General (Nurse Practitioner) Mary Fredrickson, MD as Consulting Physician (Psychiatry) Mary Hams, MD as Consulting Physician (Sports Medicine) Mary Portela, MD as Consulting Physician (Oncology) Mary Silence, MD as Consulting Physician (Gastroenterology)    INTERVAL HISTORY: Mary Stark is a 66 y.o. female with medical history including metastatic bladder cancer (10/2020) s/p chemotherapy, radiation to right psoas metastatic lesion (02/2022), recent hospitalization s/p right hemicolectomy secondary to recurrent tumor.  Palliative ask to see for symptom management and goals of care.   SOCIAL HISTORY:     reports that she quit smoking about 11 years ago. Her smoking use included cigarettes. She has a 25.00 pack-year smoking history. She has never used smokeless tobacco. She reports current alcohol use. She reports that she does not use drugs.  ADVANCE DIRECTIVES:    CODE STATUS:   PAST MEDICAL HISTORY: Past Medical History:  Diagnosis Date   Adenocarcinoma of bladder, stage 4 ([place]) 10/2020   oncologist--- dr Alen Blew and urologist-- dr pace;  w/ mets , started chemo 12/ 2021 and bilateral ureteral stents to treat hydronephrosis   Alcohol dependence ([place])    drank heavily years ago doesn't now 10/22/2021   Anemia due to chemotherapy    Anxiety    Arthritis    Back and lt knee   CKD (chronic kidney disease), stage III [facility])    COVID    August 2021, was in hospital and was on Oxygen for 11 weeks lungs still look like glass. 10/22/2021   Family history of adverse reaction to anesthesia    father had a "tight" airway   GERD (gastroesophageal reflux disease)    History of 2019 novel coronavirus disease (COVID-19) 08/13/2020   positive result in epic  08-19-2020,  hospital admission in epic,  covid pneumonia with hypoxia respiratory failure, discharged with oxygen for 6 weeks   History of chemotherapy last done 05-13-2021   History of colitis 03/2018   acute colitis with sepsis   History of hepatitis C    TX FOR 2012   History of kidney stones    History of rheumatic fever as a child    per pt no valvular issues   History of sepsis 10/2020   hospital admission in epic due to pyelonephritis due to hydronephrosis, resolved   History of skin cancer    excision from nose   Hyperlipidemia    Hypertension    MDD (major depressive disorder)    Pneumonia    august 2022   Seasonal allergies    Sepsis ([place])    uro sepsis June 2022   Vitamin D deficiency     ALLERGIES:  is allergic to metformin and related, penicillins, metformin, and penicillin g sodium.  MEDICATIONS:  Current Outpatient Medications  Medication Sig Dispense Refill   albuterol (PROAIR HFA) 108 (90 Base) MCG/ACT inhaler INHALE 1 TO 2 PUFFS BY MOUTH INTO THE LUNGS EVERY 4 HOURS AS NEEDED FOR WHEEZING OR SHORTNESS OF BREATH 8 g 1   ARIPiprazole (ABILIFY) 10 MG tablet TAKE 1 TABLET BY MOUTH ONCE DAILY 90 tablet 0   azelastine (ASTELIN) 0.1 % nasal spray Place 2 sprays into both nostrils twice daily as needed for allergies as directed 30 mL 5   bisacodyl (DULCOLAX) 5 MG EC tablet Take 2 tablets by  mouth twice a day. (Patient taking differently: Take 10 mg by mouth 2 (two) times daily.) 4 tablet 0   bisacodyl (DULCOLAX) 5 MG EC tablet Take 2 tablets (10 mg total) by mouth 2 (two) times daily. 4 tablet 0   buPROPion (WELLBUTRIN XL) 300 MG 24 hr tablet TAKE 1 TABLET BY MOUTH EVERY DAY 90 tablet 1   cephALEXin (KEFLEX) 250 MG capsule Take 1 capsule by mouth nightly as directed (Patient taking differently: Take 250 mg by mouth at bedtime. Continuous therapy due to 2 urinary stents) 90 capsule 1   cephALEXin (KEFLEX) 250 MG capsule Take 1 capsule by mouth at bedtime 90 capsule 0    clonazePAM (KLONOPIN) 1 MG tablet Take 1 tablet (1 mg total) by mouth at bedtime. 30 tablet 0   cycloSPORINE (RESTASIS) 0.05 % ophthalmic emulsion Place 1 drop both eyes twice a day (Patient taking differently: Place 1 drop into both eyes daily.) 180 each 3   doxycycline (VIBRA-TABS) 100 MG tablet Take 1 tablet (100 mg total) by mouth 2 (two) times daily for 7 days 14 tablet 0   fexofenadine (ALLEGRA) 180 MG tablet Take 1 tablet (180 mg total) by mouth daily. 90 tablet 3   gabapentin (NEURONTIN) 300 MG capsule Take 1 capsule by mouth daily and 4 capsules at bedtime as directed. (Patient taking differently: Take 300-1,200 mg by mouth See admin instructions. Take 1 capsule by mouth in the morning then take 3 capsules at night) 450 capsule 1   HYDROcodone-acetaminophen (NORCO) 5-325 MG tablet Take 1 tablet by mouth every 6 (six) hours as needed for severe pain. 30 tablet 0   lidocaine-prilocaine (EMLA) cream Apply 1 application topically as needed. (Patient taking differently: Apply 1 application  topically as needed (for port access).) 30 g 0   Meth-Hyo-M Bl-Na Phos-Ph Sal (URIBEL) 118 MG CAPS Take one capsule by mouth 3 times daily (Patient not taking: Reported on 05/23/2022) 30 capsule 3   mirabegron ER (MYRBETRIQ) 25 MG TB24 tablet TAKE 1 TABLET BY MOUTH ONCE A DAY 30 tablet 3   mirabegron ER (MYRBETRIQ) 50 MG TB24 tablet Take 1 tablet by mouth daily. 30 tablet 3   Nebivolol HCl 20 MG TABS Take 1/2 tablet (10 mg total) by mouth daily. (Patient not taking: Reported on 05/23/2022) 90 tablet 1   omeprazole (PRILOSEC) 20 MG capsule Take 1 capsule (20 mg total) by mouth daily. 90 capsule 3   ondansetron (ZOFRAN) 4 MG tablet Take 1 tablet (4 mg total) by mouth every 8 (eight) hours as needed for nausea or vomiting. 40 tablet 3   oxymetazoline (AFRIN) 0.05 % nasal spray Place 1 spray into both nostrils 2 (two) times daily as needed for congestion. For nose bleeds     polyethylene glycol-electrolytes (NULYTELY)  420 g solution Use as directed 4000 mL 0   polyethylene glycol-electrolytes (NULYTELY) 420 g solution Take as directed over 2 days. 4000 mL 0   sertraline (ZOLOFT) 25 MG tablet Take 1 tablet (25 mg total) by mouth daily. 90 tablet 1   simvastatin (ZOCOR) 20 MG tablet TAKE 1 TABLET (20 MG TOTAL) BY MOUTH DAILY. (Patient taking differently: Take 20 mg by mouth at bedtime.) 90 tablet 3   tamsulosin (FLOMAX) 0.4 MG CAPS capsule TAKE 1 CAPSULE BY MOUTH AT BEDTIME 30 capsule 3   traMADol (ULTRAM) 50 MG tablet Take 1 tablet (50 mg total) by mouth every 12 (twelve) hours as needed for moderate pain. 60 tablet 3   No current  facility-administered medications for this visit.    VITAL SIGNS: BP (!) 148/95 (BP Location: Left Arm, Patient Position: Sitting) Comment: Nurse was notify  Pulse 65   Temp (!) 97.5 F (36.4 C) (Oral)   Resp 14   Wt 158 lb 12.8 oz (72 kg)   SpO2 100%   BMI 25.63 kg/m  Filed Weights   08/12/22 0941  Weight: 158 lb 12.8 oz (72 kg)    Estimated body mass index is 25.63 kg/m as calculated from the following:   Height as of 07/22/22: '5\' 6"'$  (1.676 m).   Weight as of this encounter: 158 lb 12.8 oz (72 kg).   PERFORMANCE STATUS (ECOG) : 1 - Symptomatic but completely ambulatory   Physical Exam General: NAD Pulmonary: normal breathing pattern  Extremities: no edema, no joint deformities Skin: no rashes Neurological: AAOx3  IMPRESSION:  Mary Stark presented to clinic today for follow-up. No acute distress noted. Feels much better than previous weeks. Ongoing fatigue but improving. She is scheduled for treatment today.   Neoplasm related pain Pain is much improved, however she recently overdid herself on her recumbent bike requiring cortisone injection in her knee. Is taking ultram or hydrocodone as needed. Tolerating gabapentin. States she will take occasional Tylenol as needed.   Decreased appetite/weight loss  Appetite is slowly improving. Her weight is stable at  158 lbs.   3. Goals of Care  Mary Stark shares her advanced directives are completed. She does not desire any life sustaining measures however questions artificial feedings and starvation. We discussed at length reasons for needing PEG and what quality of life would generally look like. I encouraged her to continue thinking about her wishes as the best time to prepare for emergent events is prior to the occurrence. I introduced the MOST form. She was provided with a copy to review. Knows we can complete at any time.   I discussed the importance of continued conversation with family and their medical providers regarding overall plan of care and treatment options, ensuring decisions are within the context of the patients values and GOCs.  PLAN: No symptom needs at this time MOST form discussions. Patient plans to review and knows we can complete at anytime.  Ongoing support as needed.  I will plan to see her back in 4-6 weeks in collaboration with her other oncology appointments.    Patient expressed understanding and was in agreement with this plan. She also understands that She can call the clinic at any time with any questions, concerns, or complaints.   Time Total: 35 min   Visit consisted of counseling and education dealing with the complex and emotionally intense issues of symptom management and palliative care in the setting of serious and potentially life-threatening illness.Greater than 50%  of this time was spent counseling and coordinating care related to the above assessment and plan.  Alda Lea, AGPCNP-BC  Palliative Medicine Team/[facility]

## 2022-08-13 ENCOUNTER — Other Ambulatory Visit (HOSPITAL_COMMUNITY): Payer: Self-pay

## 2022-08-18 ENCOUNTER — Telehealth: Payer: Self-pay | Admitting: *Deleted

## 2022-08-18 ENCOUNTER — Other Ambulatory Visit (HOSPITAL_COMMUNITY): Payer: Self-pay

## 2022-08-18 ENCOUNTER — Encounter: Payer: Self-pay | Admitting: Medical-Surgical

## 2022-08-18 ENCOUNTER — Ambulatory Visit (INDEPENDENT_AMBULATORY_CARE_PROVIDER_SITE_OTHER): Payer: No Typology Code available for payment source | Admitting: Medical-Surgical

## 2022-08-18 VITALS — BP 144/79 | HR 72 | Resp 20 | Ht 66.0 in | Wt 157.8 lb

## 2022-08-18 DIAGNOSIS — I10 Essential (primary) hypertension: Secondary | ICD-10-CM | POA: Diagnosis not present

## 2022-08-18 DIAGNOSIS — F418 Other specified anxiety disorders: Secondary | ICD-10-CM

## 2022-08-18 MED ORDER — NEBIVOLOL HCL 10 MG PO TABS
10.0000 mg | ORAL_TABLET | Freq: Every day | ORAL | 1 refills | Status: DC
  Filled 2022-08-18: qty 90, 90d supply, fill #0

## 2022-08-18 NOTE — Progress Notes (Unsigned)
   Established Patient Office Visit  Subjective   Patient ID: Mary Stark, female   DOB: 09-Nov-1956 Age: 66 y.o. MRN: [id number]   No chief complaint on file.  HPI Pleasant 66 year old female presenting today    Objective:    There were no vitals filed for this visit.  Physical Exam  No results found for this or any previous visit (from the past 24 hour(s)).   {Labs (Optional):23779}  The ASCVD Risk score (Arnett DK, et al., 2019) failed to calculate for the following reasons:   Cannot find a previous HDL lab   Cannot find a previous total cholesterol lab   Assessment & Plan:     No follow-ups on file.  ___________________________________________ [name], DNP, APRN, FNP-BC [facility]

## 2022-08-18 NOTE — Telephone Encounter (Signed)
PC to patient, informed her her CT scan has been authorized by her insurance & can be scheduled at any time.  Central Scheduling number given, [phone].  She verbalizes understanding.

## 2022-08-19 ENCOUNTER — Other Ambulatory Visit (HOSPITAL_COMMUNITY): Payer: Self-pay

## 2022-08-19 ENCOUNTER — Encounter: Payer: Self-pay | Admitting: Oncology

## 2022-08-20 ENCOUNTER — Telehealth: Payer: Self-pay | Admitting: Oncology

## 2022-08-20 NOTE — Telephone Encounter (Signed)
Called patient regarding upcoming September and October appointments, patient is notified.  

## 2022-08-21 ENCOUNTER — Telehealth: Payer: Self-pay

## 2022-08-21 ENCOUNTER — Other Ambulatory Visit: Payer: Self-pay | Admitting: Osteopathic Medicine

## 2022-08-21 NOTE — Telephone Encounter (Signed)
Pt called  wanting to know if she can take Mounjaro . She is currently getting Keytruda. Per Dr Alen Blew pt is okay to take [facility]. Pt acknowledged and verbalized understanding.

## 2022-08-24 ENCOUNTER — Other Ambulatory Visit (HOSPITAL_COMMUNITY): Payer: Self-pay

## 2022-08-25 ENCOUNTER — Encounter: Payer: Self-pay | Admitting: Oncology

## 2022-08-26 ENCOUNTER — Ambulatory Visit (HOSPITAL_COMMUNITY)
Admission: RE | Admit: 2022-08-26 | Discharge: 2022-08-26 | Disposition: A | Discharge status: Home / Self Care | Payer: Medicare HMO | Source: Ambulatory Visit | Attending: Oncology | Admitting: Oncology

## 2022-08-26 ENCOUNTER — Other Ambulatory Visit (HOSPITAL_COMMUNITY): Payer: Self-pay

## 2022-08-26 DIAGNOSIS — C799 Secondary malignant neoplasm of unspecified site: Secondary | ICD-10-CM | POA: Diagnosis not present

## 2022-08-26 DIAGNOSIS — C669 Malignant neoplasm of unspecified ureter: Secondary | ICD-10-CM | POA: Insufficient documentation

## 2022-08-26 DIAGNOSIS — C679 Malignant neoplasm of bladder, unspecified: Secondary | ICD-10-CM | POA: Diagnosis not present

## 2022-08-26 DIAGNOSIS — K3189 Other diseases of stomach and duodenum: Secondary | ICD-10-CM | POA: Diagnosis not present

## 2022-08-26 DIAGNOSIS — R918 Other nonspecific abnormal finding of lung field: Secondary | ICD-10-CM | POA: Diagnosis not present

## 2022-08-26 MED ORDER — SODIUM CHLORIDE (PF) 0.9 % IJ SOLN
INTRAMUSCULAR | Status: AC
  Filled 2022-08-26: qty 50

## 2022-08-26 MED ORDER — SIMVASTATIN 20 MG PO TABS
ORAL_TABLET | Freq: Every day | ORAL | 0 refills | Status: DC
  Filled 2022-08-26: qty 90, 90d supply, fill #0

## 2022-08-26 MED ORDER — IOHEXOL 300 MG/ML  SOLN
100.0000 mL | Freq: Once | INTRAMUSCULAR | Status: AC | PRN
  Administered 2022-08-26: 100 mL via INTRAVENOUS

## 2022-08-26 MED ORDER — IOHEXOL 9 MG/ML PO SOLN: 1000.0000 mL | Freq: Once | ORAL | Status: DC

## 2022-08-26 MED ORDER — MOUNJARO 2.5 MG/0.5ML ~~LOC~~ SOAJ
SUBCUTANEOUS | 0 refills | Status: DC
  Filled 2022-08-26: qty 2, 28d supply, fill #0

## 2022-08-27 ENCOUNTER — Other Ambulatory Visit: Payer: Self-pay | Admitting: Medical-Surgical

## 2022-08-28 ENCOUNTER — Other Ambulatory Visit (HOSPITAL_COMMUNITY): Payer: Self-pay

## 2022-09-02 ENCOUNTER — Inpatient Hospital Stay: Payer: Medicare HMO | Attending: Oncology

## 2022-09-02 ENCOUNTER — Inpatient Hospital Stay: Payer: Medicare HMO

## 2022-09-02 ENCOUNTER — Other Ambulatory Visit: Payer: Self-pay

## 2022-09-02 ENCOUNTER — Other Ambulatory Visit (HOSPITAL_COMMUNITY): Payer: Self-pay

## 2022-09-02 ENCOUNTER — Inpatient Hospital Stay (HOSPITAL_BASED_OUTPATIENT_CLINIC_OR_DEPARTMENT_OTHER): Payer: Medicare HMO | Admitting: Oncology

## 2022-09-02 VITALS — BP 132/84 | HR 81 | Temp 97.9°F | Resp 17 | Ht 66.0 in | Wt 154.1 lb

## 2022-09-02 DIAGNOSIS — C679 Malignant neoplasm of bladder, unspecified: Secondary | ICD-10-CM

## 2022-09-02 DIAGNOSIS — C7989 Secondary malignant neoplasm of other specified sites: Secondary | ICD-10-CM | POA: Insufficient documentation

## 2022-09-02 DIAGNOSIS — D6481 Anemia due to antineoplastic chemotherapy: Secondary | ICD-10-CM | POA: Diagnosis not present

## 2022-09-02 DIAGNOSIS — Z923 Personal history of irradiation: Secondary | ICD-10-CM | POA: Insufficient documentation

## 2022-09-02 DIAGNOSIS — C801 Malignant (primary) neoplasm, unspecified: Secondary | ICD-10-CM

## 2022-09-02 DIAGNOSIS — C799 Secondary malignant neoplasm of unspecified site: Secondary | ICD-10-CM

## 2022-09-02 DIAGNOSIS — Z95828 Presence of other vascular implants and grafts: Secondary | ICD-10-CM

## 2022-09-02 DIAGNOSIS — Z5112 Encounter for antineoplastic immunotherapy: Secondary | ICD-10-CM | POA: Insufficient documentation

## 2022-09-02 DIAGNOSIS — C669 Malignant neoplasm of unspecified ureter: Secondary | ICD-10-CM

## 2022-09-02 LAB — CBC WITH DIFFERENTIAL (CANCER CENTER ONLY)
Abs Immature Granulocytes: 0.02 10*3/uL (ref 0.00–0.07)
Basophils Absolute: 0.1 10*3/uL (ref 0.0–0.1)
Basophils Relative: 1 %
Eosinophils Absolute: 0.5 10*3/uL (ref 0.0–0.5)
Eosinophils Relative: 7 %
HCT: 36 % (ref 36.0–46.0)
Hemoglobin: 11.7 g/dL — ABNORMAL LOW (ref 12.0–15.0)
Immature Granulocytes: 0 %
Lymphocytes Relative: 27 %
Lymphs Abs: 1.9 10*3/uL (ref 0.7–4.0)
MCH: 32.2 pg (ref 26.0–34.0)
MCHC: 32.5 g/dL (ref 30.0–36.0)
MCV: 99.2 fL (ref 80.0–100.0)
Monocytes Absolute: 0.8 10*3/uL (ref 0.1–1.0)
Monocytes Relative: 12 %
Neutro Abs: 3.7 10*3/uL (ref 1.7–7.7)
Neutrophils Relative %: 53 %
Platelet Count: 197 10*3/uL (ref 150–400)
RBC: 3.63 MIL/uL — ABNORMAL LOW (ref 3.87–5.11)
RDW: 13.4 % (ref 11.5–15.5)
WBC Count: 6.9 10*3/uL (ref 4.0–10.5)
nRBC: 0 % (ref 0.0–0.2)

## 2022-09-02 LAB — CMP (CANCER CENTER ONLY)
ALT: 12 U/L (ref 0–44)
AST: 18 U/L (ref 15–41)
Albumin: 4.3 g/dL (ref 3.5–5.0)
Alkaline Phosphatase: 81 U/L (ref 38–126)
Anion gap: 6 (ref 5–15)
BUN: 29 mg/dL — ABNORMAL HIGH (ref 8–23)
CO2: 26 mmol/L (ref 22–32)
Calcium: 9.2 mg/dL (ref 8.9–10.3)
Chloride: 101 mmol/L (ref 98–111)
Creatinine: 1.1 mg/dL — ABNORMAL HIGH (ref 0.44–1.00)
GFR, Estimated: 56 mL/min — ABNORMAL LOW (ref >60)
Glucose, Bld: 99 mg/dL (ref 70–99)
Potassium: 4 mmol/L (ref 3.5–5.1)
Sodium: 133 mmol/L — ABNORMAL LOW (ref 135–145)
Total Bilirubin: 0.3 mg/dL (ref 0.3–1.2)
Total Protein: 7.5 g/dL (ref 6.5–8.1)

## 2022-09-02 LAB — SAMPLE TO BLOOD BANK

## 2022-09-02 LAB — TSH: TSH: 6.341 u[IU]/mL — ABNORMAL HIGH (ref 0.350–4.500)

## 2022-09-02 MED ORDER — SODIUM CHLORIDE 0.9% FLUSH
10.0000 mL | Freq: Once | INTRAVENOUS | Status: AC
  Administered 2022-09-02: 10 mL

## 2022-09-02 MED ORDER — SODIUM CHLORIDE 0.9 % IV SOLN
200.0000 mg | Freq: Once | INTRAVENOUS | Status: AC
  Administered 2022-09-02: 200 mg via INTRAVENOUS
  Filled 2022-09-02: qty 200

## 2022-09-02 MED ORDER — SODIUM CHLORIDE 0.9% FLUSH
10.0000 mL | INTRAVENOUS | Status: DC | PRN
  Administered 2022-09-02: 10 mL

## 2022-09-02 MED ORDER — SODIUM CHLORIDE 0.9 % IV SOLN: Freq: Once | INTRAVENOUS | Status: AC

## 2022-09-02 MED ORDER — HEPARIN SOD (PORK) LOCK FLUSH 100 UNIT/ML IV SOLN
500.0000 [IU] | Freq: Once | INTRAVENOUS | Status: AC | PRN
  Administered 2022-09-02: 500 [IU]

## 2022-09-02 NOTE — Patient Instructions (Signed)
[facility]   Discharge Instructions: Thank you for choosing [facility] to provide your oncology and hematology care.   If you have a lab appointment with the Cancer Center, please go directly to the Cancer Center and check in at the registration area.   Wear comfortable clothing and clothing appropriate for easy access to any Portacath or PICC line.   We strive to give you quality time with your provider. You may need to reschedule your appointment if you arrive late (15 or more minutes).  Arriving late affects you and other patients whose appointments are after yours.  Also, if you miss three or more appointments without notifying the office, you may be dismissed from the clinic at the provider's discretion.      For prescription refill requests, have your pharmacy contact our office and allow 72 hours for refills to be completed.    Today you received the following chemotherapy and/or immunotherapy agents: pembrolizumab      To help prevent nausea and vomiting after your treatment, we encourage you to take your nausea medication as directed.  BELOW ARE SYMPTOMS THAT SHOULD BE REPORTED IMMEDIATELY: *FEVER GREATER THAN 100.4 F (38 C) OR HIGHER *CHILLS OR SWEATING *NAUSEA AND VOMITING THAT IS NOT CONTROLLED WITH YOUR NAUSEA MEDICATION *UNUSUAL SHORTNESS OF BREATH *UNUSUAL BRUISING OR BLEEDING *URINARY PROBLEMS (pain or burning when urinating, or frequent urination) *BOWEL PROBLEMS (unusual diarrhea, constipation, pain near the anus) TENDERNESS IN MOUTH AND THROAT WITH OR WITHOUT PRESENCE OF ULCERS (sore throat, sores in mouth, or a toothache) UNUSUAL RASH, SWELLING OR PAIN  UNUSUAL VAGINAL DISCHARGE OR ITCHING   Items with * indicate a potential emergency and should be followed up as soon as possible or go to the Emergency Department if any problems should occur.  Please show the CHEMOTHERAPY ALERT CARD or IMMUNOTHERAPY ALERT CARD at  check-in to the Emergency Department and triage nurse.  Should you have questions after your visit or need to cancel or reschedule your appointment, please contact [facility]  Dept: [phone]  and follow the prompts.  Office hours are 8:00 a.m. to 4:30 p.m. Monday - Friday. Please note that voicemails left after 4:00 p.m. may not be returned until the following business day.  We are closed weekends and major holidays. You have access to a nurse at all times for urgent questions. Please call the main number to the clinic Dept: [phone] and follow the prompts.   For any non-urgent questions, you may also contact your provider using MyChart. We now offer e-Visits for anyone 18 and older to request care online for non-urgent symptoms. For details visit [email or web address].   Also download the MyChart app! Go to the app store, search "MyChart", open the app, select [facility], and log in with your MyChart username and password.  Masks are optional in the cancer centers. If you would like for your care team to wear a mask while they are taking care of you, please let them know. You may have one support person who is at least 65 years old accompany you for your appointments. 

## 2022-09-02 NOTE — Progress Notes (Signed)
Denies anyHematology and Oncology Follow Up Visit  Mary Stark [id number] 09/03/1956 65 y.o. 09/02/2022 9:03 AM Mary [facility], Mary Stark, Mary Dad, MD   [name]: 76 year old woman with bladder cancer diagnosed in November 2021.  She presented with stage IV adenocarcinoma, pelvic adenopathy and  PD-L1 CPS score of 35.   Prior Therapy:  She is status post retroperitoneal lymph node biopsy completed on 10/24/2020 which showed metastatic carcinoma.  She is status post repeat cystoscopy and bilateral ureteral stent exchange as well as resection of a bladder tumor on January 21, 2021.  A final pathology showed an adenocarcinoma.  Chemotherapy utilizing carboplatin and gemcitabine started on November 22, 2020.  She completed 6 cycles of therapy on March 06, 2021.  FOLFOX chemotherapy started on Apr 15, 2021.  She completed 12 cycles of therapy.  She is status post radiation therapy to a right psoas metastatic lesion. She will complete 50 Gray in 5 fractions on February 27, 2022.  5-FU and leucovorin chemotherapy every 2 weeks started in November 2022.  She received maintenance therapy till June 2023 due to progression of disease.  She is status post right hemicolectomy completed on July 25, 2022.  The final pathology carcinoma that is a similar to her GU primary.  She received 1 cycle of Taxol chemotherapy on June 10, 2022.  Current therapy: Pembrolizumab 200 milligrams every 3 weeks started on July 01, 2022.  He is here for cycle 4 of therapy.  Interim History: Mary Stark returns today for a follow-up evaluation.  Since the last visit, she reports no major complaints.  She denies any nausea, vomiting or skin rash.  She denies any worsening diarrhea or fatigue.  She continues to exercise regularly and has reported increased joint pain related to her arthritis.   Medications: Reviewed without changes. Current Outpatient Medications  Medication Sig Dispense Refill   albuterol  (PROAIR HFA) 108 (90 Base) MCG/ACT inhaler INHALE 1 TO 2 PUFFS BY MOUTH INTO THE LUNGS EVERY 4 HOURS AS NEEDED FOR WHEEZING OR SHORTNESS OF BREATH 8 g 1   ARIPiprazole (ABILIFY) 10 MG tablet TAKE 1 TABLET BY MOUTH ONCE DAILY 90 tablet 0   buPROPion (WELLBUTRIN XL) 300 MG 24 hr tablet TAKE 1 TABLET BY MOUTH EVERY DAY 90 tablet 1   cephALEXin (KEFLEX) 250 MG capsule Take 1 capsule by mouth at bedtime 90 capsule 0   clonazePAM (KLONOPIN) 1 MG tablet Take 1 tablet (1 mg total) by mouth at bedtime. 30 tablet 0   cycloSPORINE (RESTASIS) 0.05 % ophthalmic emulsion Place 1 drop both eyes twice a day (Patient taking differently: Place 1 drop into both eyes daily.) 180 each 3   fexofenadine (ALLEGRA) 180 MG tablet Take 1 tablet (180 mg total) by mouth daily. 90 tablet 3   gabapentin (NEURONTIN) 300 MG capsule Take 1 capsule by mouth daily and 4 capsules at bedtime as directed. (Patient taking differently: Take 300-1,200 mg by mouth See admin instructions. Take 1 capsule by mouth in the morning then take 3 capsules at night) 450 capsule 1   HYDROcodone-acetaminophen (NORCO) 5-325 MG tablet Take 1 tablet by mouth every 6 (six) hours as needed for severe pain. 30 tablet 0   lidocaine-prilocaine (EMLA) cream Apply 1 application topically as needed. (Patient taking differently: Apply 1 application  topically as needed (for port access).) 30 g 0   lisinopril (ZESTRIL) 10 MG tablet Take by mouth.     Meth-Hyo-M Bl-Na Phos-Ph Sal (URIBEL) 118 MG CAPS Take one capsule  by mouth 3 times daily (Patient not taking: Reported on 05/23/2022) 30 capsule 3   mirabegron ER (MYRBETRIQ) 50 MG TB24 tablet Take 1 tablet by mouth daily. 30 tablet 3   nebivolol (BYSTOLIC) 10 MG tablet Take 1 tablet (10 mg total) by mouth daily. 90 tablet 1   omeprazole (PRILOSEC) 20 MG capsule Take 1 capsule (20 mg total) by mouth daily. 90 capsule 3   ondansetron (ZOFRAN) 4 MG tablet Take 1 tablet (4 mg total) by mouth every 8 (eight) hours as needed  for nausea or vomiting. 40 tablet 3   oxyCODONE (OXY IR/ROXICODONE) 5 MG immediate release tablet Take 5 mg by mouth 4 (four) times daily as needed.     sertraline (ZOLOFT) 25 MG tablet Take 1 tablet (25 mg total) by mouth daily. 90 tablet 1   simvastatin (ZOCOR) 20 MG tablet TAKE 1 TABLET (20 MG TOTAL) BY MOUTH DAILY. 90 tablet 0   tamsulosin (FLOMAX) 0.4 MG CAPS capsule TAKE 1 CAPSULE BY MOUTH AT BEDTIME 30 capsule 3   tirzepatide (MOUNJARO) 2.5 MG/0.5ML Pen Inject 1 pen (2.5 MG) into the skin once a week 2 mL 0   traMADol (ULTRAM) 50 MG tablet Take 1 tablet (50 mg total) by mouth every 12 (twelve) hours as needed for moderate pain. 60 tablet 3   No current facility-administered medications for this visit.     Allergies:  Allergies  Allergen Reactions   Metformin And Related Other (See Comments)    Lactic acidosis    Penicillins Hives and Rash    Reports hives to penicillin and amoxicillin as an adult "a long time ago." Has tolerated cefdinir (09/2020) and cefepime (10/2020) before.   Metformin Diarrhea    Extreme diarrhea and lactic acidosis   Penicillin G Sodium Hives and Rash      Physical Exam: Blood pressure 132/84, pulse 81, temperature 97.9 F (36.6 C), temperature source Temporal, resp. rate 17, height '5\' 6"'  (1.676 m), weight 154 lb 1.6 oz (69.9 kg), SpO2 99 %.   ECOG: 1   General appearance: Alert, awake without any distress. Head: Atraumatic without abnormalities Oropharynx: Without any thrush or ulcers. Eyes: No scleral icterus. Lymph nodes: No lymphadenopathy noted in the cervical, supraclavicular, or axillary nodes Heart:regular rate and rhythm, without any murmurs or gallops.   Lung: Clear to auscultation without any rhonchi, wheezes or dullness to percussion. Abdomin: Soft, nontender without any shifting dullness or ascites. Musculoskeletal: No clubbing or cyanosis. Neurological: No motor or sensory deficits. Skin: No rashes or  lesions.                         Lab Results: Lab Results  Component Value Date   WBC 9.8 08/12/2022   HGB 10.3 (L) 08/12/2022   HCT 31.8 (L) 08/12/2022   MCV 100.0 08/12/2022   PLT 196 08/12/2022     Chemistry      Component Value Date/Time   NA 134 (L) 08/12/2022 0854   NA 142 04/26/2018 1333   K 4.6 08/12/2022 0854   CL 106 08/12/2022 0854   CO2 22 08/12/2022 0854   BUN 51 (H) 08/12/2022 0854   BUN 22 04/26/2018 1333   CREATININE 1.35 (H) 08/12/2022 0854   CREATININE 1.32 (H) 06/04/2021 0000   GLU 87 02/07/2013 0000      Component Value Date/Time   CALCIUM 9.2 08/12/2022 0854   ALKPHOS 65 08/12/2022 0854   AST 13 (L) 08/12/2022 1448  ALT 10 08/12/2022 0854   BILITOT 0.2 (L) 08/12/2022 0854      IMPRESSION: 1. Postoperative changes of RIGHT hemicolectomy with ileocolonic anastomosis. No evidence of bowel obstruction or soft tissue thickening in the area of the colonic anastomosis. 2. No new or progressive findings. 3. Scattered small lymph nodes throughout the retroperitoneum are unchanged, also with stable appearance of small RIGHT retroperitoneal soft tissue nodule and retroperitoneal fascial thickening. 4. Post inflammatory fibrotic changes in the chest without change, potentially associated with mild air trapping. Correlate with respiratory symptoms. 5. Persistent LEFT-sided hydroureteronephrosis despite presence of double-J stent is similar to May 23, 2022 and perhaps improved since Apr 21, 2022. Attention on follow-up. 6. Aortic atherosclerosis.   Aortic Atherosclerosis (ICD10-I70.0).  Impression and Plan:    66 year old woman with:   1.   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  She is currently on Pembrolizumab with reasonable tolerance to this medication.  CT scan obtained on 08/26/2022 was personally reviewed and showed overall stable disease.  Long-term complication associated  with immunotherapy were reiterated.  These issues include auto complications among others were discussed.  She is agreeable to continue.  2.  IV access: Port-A-Cath remains in use without any issues.  3.  Antiemetics: No nausea or vomiting reported at this time.  Compazine is available to look for.   4.  Renal function surveillance: Her kidney function continues to improve approaching baseline.   5.  Goals of care: Therapy remains palliative although aggressive measures are warranted.  6.  Anemia: Hemoglobin continues to improve without any need for transfusion or growth factor support.  This is related to her malignancy and previous chemotherapy.  7.  Colon obstruction: Her bowel habits are normalizing after surgical resection.  8.  Autoimmune considerations: She has not experienced any issues at this time.  These include pneumonitis, colitis and thyroid disease.   9.  Follow-up: She will return in 3 weeks for a follow-up.   30  minutes were dedicated to this encounter.  The time spent on reviewing laboratory data, disease status update, treatment choices and future plan of care discussion.  Zola Button, MD 9/20/20239:03 AM

## 2022-09-03 ENCOUNTER — Other Ambulatory Visit: Payer: Self-pay | Admitting: Medical-Surgical

## 2022-09-04 ENCOUNTER — Other Ambulatory Visit (HOSPITAL_COMMUNITY): Payer: Self-pay

## 2022-09-04 MED ORDER — CLONAZEPAM 1 MG PO TABS
1.0000 mg | ORAL_TABLET | Freq: Every day | ORAL | 0 refills | Status: DC
  Filled 2022-09-04: qty 30, 30d supply, fill #0

## 2022-09-07 ENCOUNTER — Other Ambulatory Visit (HOSPITAL_COMMUNITY): Payer: Self-pay

## 2022-09-09 ENCOUNTER — Other Ambulatory Visit (HOSPITAL_COMMUNITY): Payer: Self-pay

## 2022-09-09 ENCOUNTER — Other Ambulatory Visit: Payer: Self-pay

## 2022-09-09 DIAGNOSIS — M170 Bilateral primary osteoarthritis of knee: Secondary | ICD-10-CM

## 2022-09-09 MED ORDER — TRAMADOL HCL 50 MG PO TABS
50.0000 mg | ORAL_TABLET | Freq: Two times a day (BID) | ORAL | 0 refills | Status: DC | PRN
  Filled 2022-09-09: qty 60, 30d supply, fill #0
  Filled 2022-12-21 – 2022-12-28 (×2): qty 60, 30d supply, fill #1

## 2022-09-09 NOTE — Telephone Encounter (Signed)
This is going to a mail order pharmacy. They usually require a 90 day supply.

## 2022-09-10 ENCOUNTER — Other Ambulatory Visit: Payer: Self-pay

## 2022-09-10 DIAGNOSIS — Z791 Long term (current) use of non-steroidal anti-inflammatories (NSAID): Secondary | ICD-10-CM

## 2022-09-10 MED ORDER — GABAPENTIN 300 MG PO CAPS: ORAL_CAPSULE | ORAL | 1 refills | Status: DC

## 2022-09-10 MED ORDER — SERTRALINE HCL 25 MG PO TABS: 25.0000 mg | ORAL_TABLET | Freq: Every day | ORAL | 1 refills | Status: DC

## 2022-09-10 MED ORDER — CLONAZEPAM 1 MG PO TABS: 1.0000 mg | ORAL_TABLET | Freq: Every day | ORAL | 1 refills | Status: DC

## 2022-09-10 MED ORDER — OMEPRAZOLE 20 MG PO CPDR: 20.0000 mg | DELAYED_RELEASE_CAPSULE | Freq: Every day | ORAL | 3 refills | Status: DC

## 2022-09-10 MED ORDER — BUPROPION HCL ER (XL) 300 MG PO TB24: ORAL_TABLET | Freq: Every day | ORAL | 1 refills | Status: DC

## 2022-09-10 MED ORDER — NEBIVOLOL HCL 10 MG PO TABS: 10.0000 mg | ORAL_TABLET | Freq: Every day | ORAL | 1 refills | Status: DC

## 2022-09-10 MED ORDER — SIMVASTATIN 20 MG PO TABS: ORAL_TABLET | Freq: Every day | ORAL | 0 refills | Status: DC

## 2022-09-10 NOTE — Progress Notes (Signed)
[facility] called requesting her Rx to be sent there.

## 2022-09-17 ENCOUNTER — Other Ambulatory Visit (HOSPITAL_COMMUNITY): Payer: Self-pay

## 2022-09-22 ENCOUNTER — Other Ambulatory Visit (HOSPITAL_COMMUNITY): Payer: Self-pay

## 2022-09-22 MED ORDER — CEPHALEXIN 250 MG PO CAPS
250.0000 mg | ORAL_CAPSULE | Freq: Every evening | ORAL | 3 refills | Status: DC
  Filled 2022-09-22 – 2022-10-08 (×2): qty 90, 90d supply, fill #0
  Filled 2022-10-30: qty 90, 90d supply, fill #1

## 2022-09-22 MED ORDER — TAMSULOSIN HCL 0.4 MG PO CAPS
0.4000 mg | ORAL_CAPSULE | Freq: Every day | ORAL | 3 refills | Status: AC
  Filled 2022-09-22 – 2022-10-19 (×4): qty 90, 90d supply, fill #0
  Filled 2023-01-17: qty 90, 90d supply, fill #1
  Filled 2023-04-13: qty 90, 90d supply, fill #2
  Filled 2023-07-14: qty 90, 90d supply, fill #3

## 2022-09-23 ENCOUNTER — Other Ambulatory Visit (HOSPITAL_COMMUNITY): Payer: Self-pay

## 2022-09-23 ENCOUNTER — Telehealth: Payer: Self-pay

## 2022-09-23 ENCOUNTER — Inpatient Hospital Stay: Payer: Medicare HMO | Attending: Oncology

## 2022-09-23 ENCOUNTER — Inpatient Hospital Stay (HOSPITAL_BASED_OUTPATIENT_CLINIC_OR_DEPARTMENT_OTHER): Payer: Medicare HMO | Admitting: Oncology

## 2022-09-23 ENCOUNTER — Other Ambulatory Visit: Payer: Self-pay

## 2022-09-23 ENCOUNTER — Other Ambulatory Visit: Payer: No Typology Code available for payment source

## 2022-09-23 ENCOUNTER — Inpatient Hospital Stay: Payer: Medicare HMO | Admitting: Nurse Practitioner

## 2022-09-23 ENCOUNTER — Inpatient Hospital Stay: Payer: Medicare HMO

## 2022-09-23 VITALS — BP 141/90 | HR 72 | Temp 98.5°F | Resp 16 | Wt 150.2 lb

## 2022-09-23 DIAGNOSIS — C679 Malignant neoplasm of bladder, unspecified: Secondary | ICD-10-CM | POA: Insufficient documentation

## 2022-09-23 DIAGNOSIS — C7989 Secondary malignant neoplasm of other specified sites: Secondary | ICD-10-CM | POA: Insufficient documentation

## 2022-09-23 DIAGNOSIS — C799 Secondary malignant neoplasm of unspecified site: Secondary | ICD-10-CM

## 2022-09-23 DIAGNOSIS — Z923 Personal history of irradiation: Secondary | ICD-10-CM | POA: Insufficient documentation

## 2022-09-23 DIAGNOSIS — Z79899 Other long term (current) drug therapy: Secondary | ICD-10-CM | POA: Insufficient documentation

## 2022-09-23 DIAGNOSIS — C669 Malignant neoplasm of unspecified ureter: Secondary | ICD-10-CM

## 2022-09-23 DIAGNOSIS — Z5112 Encounter for antineoplastic immunotherapy: Secondary | ICD-10-CM | POA: Diagnosis not present

## 2022-09-23 DIAGNOSIS — D6481 Anemia due to antineoplastic chemotherapy: Secondary | ICD-10-CM | POA: Insufficient documentation

## 2022-09-23 DIAGNOSIS — C801 Malignant (primary) neoplasm, unspecified: Secondary | ICD-10-CM

## 2022-09-23 DIAGNOSIS — Z95828 Presence of other vascular implants and grafts: Secondary | ICD-10-CM

## 2022-09-23 LAB — CMP (CANCER CENTER ONLY)
ALT: 14 U/L (ref 0–44)
AST: 18 U/L (ref 15–41)
Albumin: 4.3 g/dL (ref 3.5–5.0)
Alkaline Phosphatase: 74 U/L (ref 38–126)
Anion gap: 5 (ref 5–15)
BUN: 12 mg/dL (ref 8–23)
CO2: 26 mmol/L (ref 22–32)
Calcium: 8.9 mg/dL (ref 8.9–10.3)
Chloride: 97 mmol/L — ABNORMAL LOW (ref 98–111)
Creatinine: 1.12 mg/dL — ABNORMAL HIGH (ref 0.44–1.00)
GFR, Estimated: 55 mL/min — ABNORMAL LOW (ref >60)
Glucose, Bld: 94 mg/dL (ref 70–99)
Potassium: 3.9 mmol/L (ref 3.5–5.1)
Sodium: 128 mmol/L — ABNORMAL LOW (ref 135–145)
Total Bilirubin: 0.3 mg/dL (ref 0.3–1.2)
Total Protein: 7.4 g/dL (ref 6.5–8.1)

## 2022-09-23 LAB — CBC WITH DIFFERENTIAL (CANCER CENTER ONLY)
Abs Immature Granulocytes: 0.01 10*3/uL (ref 0.00–0.07)
Basophils Absolute: 0.1 10*3/uL (ref 0.0–0.1)
Basophils Relative: 1 %
Eosinophils Absolute: 0.5 10*3/uL (ref 0.0–0.5)
Eosinophils Relative: 8 %
HCT: 34.4 % — ABNORMAL LOW (ref 36.0–46.0)
Hemoglobin: 11.9 g/dL — ABNORMAL LOW (ref 12.0–15.0)
Immature Granulocytes: 0 %
Lymphocytes Relative: 32 %
Lymphs Abs: 2 10*3/uL (ref 0.7–4.0)
MCH: 32.3 pg (ref 26.0–34.0)
MCHC: 34.6 g/dL (ref 30.0–36.0)
MCV: 93.5 fL (ref 80.0–100.0)
Monocytes Absolute: 0.7 10*3/uL (ref 0.1–1.0)
Monocytes Relative: 11 %
Neutro Abs: 3 10*3/uL (ref 1.7–7.7)
Neutrophils Relative %: 48 %
Platelet Count: 142 10*3/uL — ABNORMAL LOW (ref 150–400)
RBC: 3.68 MIL/uL — ABNORMAL LOW (ref 3.87–5.11)
RDW: 12.5 % (ref 11.5–15.5)
WBC Count: 6.3 10*3/uL (ref 4.0–10.5)
nRBC: 0 % (ref 0.0–0.2)

## 2022-09-23 LAB — TSH: TSH: 1.383 u[IU]/mL (ref 0.350–4.500)

## 2022-09-23 LAB — SAMPLE TO BLOOD BANK

## 2022-09-23 MED ORDER — SODIUM CHLORIDE 0.9 % IV SOLN: Freq: Once | INTRAVENOUS | Status: AC

## 2022-09-23 MED ORDER — MOUNJARO 2.5 MG/0.5ML ~~LOC~~ SOAJ
2.5000 mg | SUBCUTANEOUS | 0 refills | Status: DC
  Filled 2022-09-23: qty 4, 56d supply, fill #0

## 2022-09-23 MED ORDER — SODIUM CHLORIDE 0.9% FLUSH
10.0000 mL | Freq: Once | INTRAVENOUS | Status: AC
  Administered 2022-09-23: 10 mL

## 2022-09-23 MED ORDER — HEPARIN SOD (PORK) LOCK FLUSH 100 UNIT/ML IV SOLN
500.0000 [IU] | Freq: Once | INTRAVENOUS | Status: AC | PRN
  Administered 2022-09-23: 500 [IU]

## 2022-09-23 MED ORDER — SODIUM CHLORIDE 0.9% FLUSH
10.0000 mL | INTRAVENOUS | Status: DC | PRN
  Administered 2022-09-23: 10 mL

## 2022-09-23 MED ORDER — SODIUM CHLORIDE 0.9 % IV SOLN
200.0000 mg | Freq: Once | INTRAVENOUS | Status: AC
  Administered 2022-09-23: 200 mg via INTRAVENOUS
  Filled 2022-09-23: qty 200

## 2022-09-23 NOTE — Patient Instructions (Signed)
[name]   Discharge Instructions: Thank you for choosing [facility] to provide your oncology and hematology care.   If you have a lab appointment with the [facility], please go directly to the [facility] and check in at the registration area.   Wear comfortable clothing and clothing appropriate for easy access to any Portacath or PICC line.   We strive to give you quality time with your provider. You may need to reschedule your appointment if you arrive late (15 or more minutes).  Arriving late affects you and other patients whose appointments are after yours.  Also, if you miss three or more appointments without notifying the office, you may be dismissed from the clinic at the provider's discretion.      For prescription refill requests, have your pharmacy contact our office and allow 72 hours for refills to be completed.    Today you received the following chemotherapy and/or immunotherapy agents: pembrolizumab      To help prevent nausea and vomiting after your treatment, we encourage you to take your nausea medication as directed.  BELOW ARE SYMPTOMS THAT SHOULD BE REPORTED IMMEDIATELY: *FEVER GREATER THAN 100.4 F (38 C) OR HIGHER *CHILLS OR SWEATING *NAUSEA AND VOMITING THAT IS NOT CONTROLLED WITH YOUR NAUSEA MEDICATION *UNUSUAL SHORTNESS OF BREATH *UNUSUAL BRUISING OR BLEEDING *URINARY PROBLEMS (pain or burning when urinating, or frequent urination) *BOWEL PROBLEMS (unusual diarrhea, constipation, pain near the anus) TENDERNESS IN MOUTH AND THROAT WITH OR WITHOUT PRESENCE OF ULCERS (sore throat, sores in mouth, or a toothache) UNUSUAL RASH, SWELLING OR PAIN  UNUSUAL VAGINAL DISCHARGE OR ITCHING   Items with * indicate a potential emergency and should be followed up as soon as possible or go to the Emergency Department if any problems should occur.  Please show the CHEMOTHERAPY ALERT CARD or IMMUNOTHERAPY ALERT CARD at  check-in to the Emergency Department and triage nurse.  Should you have questions after your visit or need to cancel or reschedule your appointment, please contact [facility]  Dept: [phone]  and follow the prompts.  Office hours are 8:00 a.m. to 4:30 p.m. Monday - Friday. Please note that voicemails left after 4:00 p.m. may not be returned until the following business day.  We are closed weekends and major holidays. You have access to a nurse at all times for urgent questions. Please call the main number to the clinic Dept: [phone] and follow the prompts.   For any non-urgent questions, you may also contact your provider using MyChart. We now offer e-Visits for anyone 18 and older to request care online for non-urgent symptoms. For details visit [email or web address].   Also download the MyChart app! Go to the app store, search "MyChart", open the app, select [facility], and log in with your MyChart username and password.  Masks are optional in the cancer centers. If you would like for your care team to wear a mask while they are taking care of you, please let them know. You may have one support person who is at least 65 years old accompany you for your appointments. 

## 2022-09-23 NOTE — Telephone Encounter (Signed)
Erroneous entry

## 2022-09-23 NOTE — Progress Notes (Signed)
Denies anyHematology and Oncology Follow Up Visit  Mary Stark [id number] 1956/12/03 66 y.o. 09/23/2022 10:12 AM [name], Scherrie Bateman, Mathis Dad, MD   [name]: 65 year old woman with adenocarcinoma of the bladder diagnosed in November 2021.  She presented with stage IV disease, PD-L1 CPS score of 35 pelvic adenopathy.  Prior Therapy:  She is status post retroperitoneal lymph node biopsy completed on 10/24/2020 which showed metastatic carcinoma.  She is status post repeat cystoscopy and bilateral ureteral stent exchange as well as resection of a bladder tumor on January 21, 2021.  A final pathology showed an adenocarcinoma.  Chemotherapy utilizing carboplatin and gemcitabine started on November 22, 2020.  She completed 6 cycles of therapy on March 06, 2021.  FOLFOX chemotherapy started on Apr 15, 2021.  She completed 12 cycles of therapy.  She is status post radiation therapy to a right psoas metastatic lesion. She will complete 50 Gray in 5 fractions on February 27, 2022.  5-FU and leucovorin chemotherapy every 2 weeks started in November 2022.  She received maintenance therapy till June 2023 due to progression of disease.  She is status post right hemicolectomy completed on July 25, 2022.  The final pathology carcinoma that is a similar to her GU primary.  She received 1 cycle of Taxol chemotherapy on June 10, 2022.  Current therapy: Pembrolizumab 200 milligrams every 3 weeks started on July 01, 2022.  He is here for cycle 5 of therapy.  Interim History: [facility] presents today for a repeat evaluation.  Since the last visit, she reports no major changes in her health.  She denies any excessive fatigue tiredness or weakness.  She does report some occasional dyspnea on exertion and nonproductive cough.  She denies any skin rash or lesions.  She denies any worsening diarrhea.  She denies any complications related to Pembrolizumab.   Medications: Updated on  review. Current Outpatient Medications  Medication Sig Dispense Refill   albuterol (PROAIR HFA) 108 (90 Base) MCG/ACT inhaler INHALE 1 TO 2 PUFFS BY MOUTH INTO THE LUNGS EVERY 4 HOURS AS NEEDED FOR WHEEZING OR SHORTNESS OF BREATH 8 g 1   ARIPiprazole (ABILIFY) 10 MG tablet TAKE 1 TABLET BY MOUTH ONCE DAILY 90 tablet 0   buPROPion (WELLBUTRIN XL) 300 MG 24 hr tablet TAKE 1 TABLET BY MOUTH EVERY DAY 90 tablet 1   cephALEXin (KEFLEX) 250 MG capsule Take 1 capsule (250 mg total) by mouth at bedtime. 90 capsule 3   clonazePAM (KLONOPIN) 1 MG tablet Take 1 tablet (1 mg total) by mouth at bedtime. 30 tablet 1   cycloSPORINE (RESTASIS) 0.05 % ophthalmic emulsion Place 1 drop both eyes twice a day (Patient taking differently: Place 1 drop into both eyes daily.) 180 each 3   fexofenadine (ALLEGRA) 180 MG tablet Take 1 tablet (180 mg total) by mouth daily. 90 tablet 3   gabapentin (NEURONTIN) 300 MG capsule Take 1 capsule by mouth daily and 4 capsules at bedtime as directed. 450 capsule 1   HYDROcodone-acetaminophen (NORCO) 5-325 MG tablet Take 1 tablet by mouth every 6 (six) hours as needed for severe pain. 30 tablet 0   lidocaine-prilocaine (EMLA) cream Apply 1 application topically as needed. (Patient taking differently: Apply 1 application  topically as needed (for port access).) 30 g 0   lisinopril (ZESTRIL) 10 MG tablet Take by mouth.     Meth-Hyo-M Bl-Na Phos-Ph Sal (URIBEL) 118 MG CAPS Take one capsule by mouth 3 times daily (Patient not taking: Reported on  05/23/2022) 30 capsule 3   mirabegron ER (MYRBETRIQ) 50 MG TB24 tablet Take 1 tablet by mouth daily. 30 tablet 3   nebivolol (BYSTOLIC) 10 MG tablet Take 1 tablet (10 mg total) by mouth daily. 90 tablet 1   omeprazole (PRILOSEC) 20 MG capsule Take 1 capsule (20 mg total) by mouth daily. 90 capsule 3   ondansetron (ZOFRAN) 4 MG tablet Take 1 tablet (4 mg total) by mouth every 8 (eight) hours as needed for nausea or vomiting. 40 tablet 3   oxyCODONE  (OXY IR/ROXICODONE) 5 MG immediate release tablet Take 5 mg by mouth 4 (four) times daily as needed.     sertraline (ZOLOFT) 25 MG tablet Take 1 tablet (25 mg total) by mouth daily. 90 tablet 1   simvastatin (ZOCOR) 20 MG tablet TAKE 1 TABLET (20 MG TOTAL) BY MOUTH DAILY. 90 tablet 0   tamsulosin (FLOMAX) 0.4 MG CAPS capsule Take 1 capsule (0.4 mg total) by mouth at bedtime. 90 capsule 3   traMADol (ULTRAM) 50 MG tablet Take 1 tablet (50 mg total) by mouth every 12 (twelve) hours as needed for moderate pain. 180 tablet 0   No current facility-administered medications for this visit.     Allergies:  Allergies  Allergen Reactions   Metformin And Related Other (See Comments)    Lactic acidosis    Penicillins Hives and Rash    Reports hives to penicillin and amoxicillin as an adult "a long time ago." Has tolerated cefdinir (09/2020) and cefepime (10/2020) before.   Metformin Diarrhea    Extreme diarrhea and lactic acidosis   Penicillin G Sodium Hives and Rash      Physical Exam:  Blood pressure (!) 141/90, pulse 72, temperature 98.5 F (36.9 C), temperature source Temporal, resp. rate 16, weight 150 lb 3.2 oz (68.1 kg), SpO2 100 %.   ECOG: 1    General appearance: Comfortable appearing without any discomfort Head: Normocephalic without any trauma Oropharynx: Mucous membranes are moist and pink without any thrush or ulcers. Eyes: Pupils are equal and round reactive to light. Lymph nodes: No cervical, supraclavicular, inguinal or axillary lymphadenopathy.   Heart:regular rate and rhythm.  S1 and S2 without leg edema. Lung: Clear without any rhonchi or wheezes.  No dullness to percussion. Abdomin: Soft, nontender, nondistended with good bowel sounds.  No hepatosplenomegaly. Musculoskeletal: No joint deformity or effusion.  Full range of motion noted. Neurological: No deficits noted on motor, sensory and deep tendon reflex exam. Skin: No petechial rash or dryness.  Appeared moist.   .                         Lab Results: Lab Results  Component Value Date   WBC 6.9 09/02/2022   HGB 11.7 (L) 09/02/2022   HCT 36.0 09/02/2022   MCV 99.2 09/02/2022   PLT 197 09/02/2022     Chemistry      Component Value Date/Time   NA 133 (L) 09/02/2022 0901   NA 142 04/26/2018 1333   K 4.0 09/02/2022 0901   CL 101 09/02/2022 0901   CO2 26 09/02/2022 0901   BUN 29 (H) 09/02/2022 0901   BUN 22 04/26/2018 1333   CREATININE 1.10 (H) 09/02/2022 0901   CREATININE 1.32 (H) 06/04/2021 0000   GLU 87 02/07/2013 0000      Component Value Date/Time   CALCIUM 9.2 09/02/2022 0901   ALKPHOS 81 09/02/2022 0901   AST 18 09/02/2022 0901  ALT 12 09/02/2022 0901   BILITOT 0.3 09/02/2022 0901       Impression and Plan:    66 year old woman with:   1.   Bladder cancer diagnosed in 2021.  She was found to have stage IV adenocarcinoma with pelvic adenopathy.  The course of this disease was reviewed at this time and treatment options were discussed.  She had a reasonable response to Pembrolizumab with the mostly stabilization of her disease.  Risks and benefits of continuing this treatment were discussed.  Complications including autoimmune issues as well as GI toxicity were reiterated.  Alternative treatment options will be traditional chemotherapy just taxane-based regimen.  She is agreeable to proceed.   2.  IV access: Port-A-Cath continues to be in use without any issues.  3.  Antiemetics: Compazine is available to her without any nausea or vomiting.   4.  Renal function surveillance: Her creatinine clearance close to normal range at this time.   5.  Goals of care: Her disease is incurable although aggressive measures are warranted.  6.  Anemia: Related to malignancy and chemotherapy.  Hemoglobin continues to improve.  7.  Colon obstruction: Related to her tumor and underwent surgical resection.  Bowel habits are back to baseline.   8.  Autoimmune  considerations: I continue to reiterate those including pneumonitis, colitis and thyroid disease.   9.  Follow-up: In 3 weeks for a follow-up visit.   30  minutes were spent on this visit.  The time was dedicated to reviewing her disease status, treatment choices and addressing complications related to cancer and cancer therapy.  Zola Button, MD 10/11/202310:12 AM

## 2022-09-25 ENCOUNTER — Telehealth: Payer: Self-pay | Admitting: Oncology

## 2022-09-25 NOTE — Telephone Encounter (Signed)
Called patient regarding upcoming appointments, patient is notified. 

## 2022-10-06 ENCOUNTER — Other Ambulatory Visit (HOSPITAL_COMMUNITY): Payer: Self-pay

## 2022-10-06 ENCOUNTER — Other Ambulatory Visit: Payer: Self-pay | Admitting: Medical-Surgical

## 2022-10-06 MED ORDER — ARIPIPRAZOLE 10 MG PO TABS
10.0000 mg | ORAL_TABLET | Freq: Every day | ORAL | 0 refills | Status: DC
  Filled 2022-10-06: qty 90, 90d supply, fill #0

## 2022-10-06 MED ORDER — OXYBUTYNIN CHLORIDE 5 MG PO TABS
5.0000 mg | ORAL_TABLET | Freq: Three times a day (TID) | ORAL | 3 refills | Status: AC | PRN
  Filled 2022-10-06: qty 90, 30d supply, fill #0

## 2022-10-07 ENCOUNTER — Other Ambulatory Visit (HOSPITAL_COMMUNITY): Payer: Self-pay

## 2022-10-08 ENCOUNTER — Other Ambulatory Visit (HOSPITAL_COMMUNITY): Payer: Self-pay

## 2022-10-11 NOTE — Progress Notes (Signed)
Virtual Visit via Video Note  I connected with Mary Stark on 10/12/22 at  1:00 PM EDT by a video enabled telemedicine application and verified that I am speaking with the correct person using two identifiers.   I discussed the limitations of evaluation and management by telemedicine and the availability of in person appointments. The patient expressed understanding and agreed to proceed.  Patient location: home Provider locations: office  Subjective:    CC: Shortness of breath/dizziness  HPI: Pleasant 66 year old female presenting today with reports of dizziness when standing upright and shortness of breath.  She reports this has been happening for the last several months and does not seem to be improving.  She notes that her blood pressure will be normal while she sitting but when she stands it drops significantly and she becomes dizzy with shortness of breath.  During these episodes, she does not have any heart racing issues.  This morning, she notes her blood pressure was 111/75 while sitting and dropped to 80s/60s when she stood.  She is currently taking nebivolol 10 mg daily to help control her heart rate.  She is also on 4 different antidepressants but these have been chronic medications.  Reports that oncology switched her over to [facility] for immunotherapy and she wonders if this may be related to her current symptoms.  She is staying well-hydrated and endorses at least 64 ounces of fluid per day.  Eating small meals regularly.  No syncopal episodes or falls related to her symptoms.  Past medical history, Surgical history, Family history not pertinant except as noted below, Social history, Allergies, and medications have been entered into the medical record, reviewed, and corrections made.   Review of Systems: See HPI for pertinent positives and negatives.   Objective:    General: Speaking clearly in complete sentences without any shortness of breath.  Alert and oriented x3.   Normal judgment. No apparent acute distress.  Impression and Recommendations:    1. Orthostasis Unclear etiology.  She has multiple things that could be contributing to her current symptoms.  Per patient report, she is well-hydrated so less likely related to dehydration.  Suspect possible medication interaction/side effects.  Discussed nebivolol.  Would like for her to try cutting this in half to 5 mg daily as long as her heart rate stays well controlled and she does begin to experience palpitations or tachycardia.  No previous cardiac work-up on file outside of EKGs.  We will go ahead and get an echocardiogram for further evaluation of heart function.  Discussed the addition of Florinef versus midodrine but we would like to hold off on this for now as this may increase her heart rate.  Advised her to reach out to oncology regarding her symptoms to see if this is a possible reaction from [facility].  Continue to monitor blood pressure at home.  Of note, her most recent labs have shown hyponatremia with most recent approximately 3 weeks ago at 128.  Recommend incorporating electrolyte solution such as Gatorade or Powerade 1 bottle a day in place of other beverages. - ECHOCARDIOGRAM COMPLETE; Future  I discussed the assessment and treatment plan with the patient. The patient was provided an opportunity to ask questions and all were answered. The patient agreed with the plan and demonstrated an understanding of the instructions.   The patient was advised to call back or seek an in-person evaluation if the symptoms worsen or if the condition fails to improve as anticipated.  25 minutes of  non-face-to-face time was provided during this encounter.  Return if symptoms worsen or fail to improve.  Thayer Ohm, DNP, APRN, FNP-BC [facility] and [facility] Medicine

## 2022-10-12 ENCOUNTER — Encounter: Payer: Self-pay | Admitting: Medical-Surgical

## 2022-10-12 ENCOUNTER — Ambulatory Visit (INDEPENDENT_AMBULATORY_CARE_PROVIDER_SITE_OTHER): Payer: Medicare HMO | Admitting: Medical-Surgical

## 2022-10-12 DIAGNOSIS — I951 Orthostatic hypotension: Secondary | ICD-10-CM | POA: Diagnosis not present

## 2022-10-14 ENCOUNTER — Inpatient Hospital Stay: Payer: Medicare HMO

## 2022-10-14 ENCOUNTER — Inpatient Hospital Stay: Payer: Medicare HMO | Attending: Oncology | Admitting: Oncology

## 2022-10-14 VITALS — BP 137/87 | HR 78 | Temp 97.7°F | Resp 16 | Ht 66.0 in | Wt 153.5 lb

## 2022-10-14 DIAGNOSIS — C7989 Secondary malignant neoplasm of other specified sites: Secondary | ICD-10-CM | POA: Insufficient documentation

## 2022-10-14 DIAGNOSIS — D6481 Anemia due to antineoplastic chemotherapy: Secondary | ICD-10-CM | POA: Insufficient documentation

## 2022-10-14 DIAGNOSIS — Z5112 Encounter for antineoplastic immunotherapy: Secondary | ICD-10-CM | POA: Diagnosis not present

## 2022-10-14 DIAGNOSIS — C799 Secondary malignant neoplasm of unspecified site: Secondary | ICD-10-CM

## 2022-10-14 DIAGNOSIS — Z923 Personal history of irradiation: Secondary | ICD-10-CM | POA: Diagnosis not present

## 2022-10-14 DIAGNOSIS — C679 Malignant neoplasm of bladder, unspecified: Secondary | ICD-10-CM | POA: Diagnosis not present

## 2022-10-14 DIAGNOSIS — C669 Malignant neoplasm of unspecified ureter: Secondary | ICD-10-CM

## 2022-10-14 DIAGNOSIS — Z79899 Other long term (current) drug therapy: Secondary | ICD-10-CM | POA: Diagnosis not present

## 2022-10-14 DIAGNOSIS — Z95828 Presence of other vascular implants and grafts: Secondary | ICD-10-CM

## 2022-10-14 DIAGNOSIS — C801 Malignant (primary) neoplasm, unspecified: Secondary | ICD-10-CM

## 2022-10-14 LAB — CBC WITH DIFFERENTIAL (CANCER CENTER ONLY)
Abs Immature Granulocytes: 0.02 10*3/uL (ref 0.00–0.07)
Basophils Absolute: 0.1 10*3/uL (ref 0.0–0.1)
Basophils Relative: 1 %
Eosinophils Absolute: 0.9 10*3/uL — ABNORMAL HIGH (ref 0.0–0.5)
Eosinophils Relative: 12 %
HCT: 33.8 % — ABNORMAL LOW (ref 36.0–46.0)
Hemoglobin: 11.5 g/dL — ABNORMAL LOW (ref 12.0–15.0)
Immature Granulocytes: 0 %
Lymphocytes Relative: 33 %
Lymphs Abs: 2.4 10*3/uL (ref 0.7–4.0)
MCH: 32.4 pg (ref 26.0–34.0)
MCHC: 34 g/dL (ref 30.0–36.0)
MCV: 95.2 fL (ref 80.0–100.0)
Monocytes Absolute: 0.7 10*3/uL (ref 0.1–1.0)
Monocytes Relative: 10 %
Neutro Abs: 3.3 10*3/uL (ref 1.7–7.7)
Neutrophils Relative %: 44 %
Platelet Count: 172 10*3/uL (ref 150–400)
RBC: 3.55 MIL/uL — ABNORMAL LOW (ref 3.87–5.11)
RDW: 13.1 % (ref 11.5–15.5)
WBC Count: 7.5 10*3/uL (ref 4.0–10.5)
nRBC: 0 % (ref 0.0–0.2)

## 2022-10-14 LAB — CMP (CANCER CENTER ONLY)
ALT: 15 U/L (ref 0–44)
AST: 17 U/L (ref 15–41)
Albumin: 4.1 g/dL (ref 3.5–5.0)
Alkaline Phosphatase: 74 U/L (ref 38–126)
Anion gap: 6 (ref 5–15)
BUN: 15 mg/dL (ref 8–23)
CO2: 27 mmol/L (ref 22–32)
Calcium: 9 mg/dL (ref 8.9–10.3)
Chloride: 99 mmol/L (ref 98–111)
Creatinine: 1.03 mg/dL — ABNORMAL HIGH (ref 0.44–1.00)
GFR, Estimated: >60 mL/min (ref >60)
Glucose, Bld: 93 mg/dL (ref 70–99)
Potassium: 4.3 mmol/L (ref 3.5–5.1)
Sodium: 132 mmol/L — ABNORMAL LOW (ref 135–145)
Total Bilirubin: 0.2 mg/dL — ABNORMAL LOW (ref 0.3–1.2)
Total Protein: 7.1 g/dL (ref 6.5–8.1)

## 2022-10-14 LAB — SAMPLE TO BLOOD BANK

## 2022-10-14 LAB — TSH: TSH: 3.127 u[IU]/mL (ref 0.350–4.500)

## 2022-10-14 MED ORDER — HEPARIN SOD (PORK) LOCK FLUSH 100 UNIT/ML IV SOLN
500.0000 [IU] | Freq: Once | INTRAVENOUS | Status: AC | PRN
  Administered 2022-10-14: 500 [IU]

## 2022-10-14 MED ORDER — SODIUM CHLORIDE 0.9 % IV SOLN: Freq: Once | INTRAVENOUS | Status: AC

## 2022-10-14 MED ORDER — SODIUM CHLORIDE 0.9% FLUSH
10.0000 mL | Freq: Once | INTRAVENOUS | Status: AC
  Administered 2022-10-14: 10 mL

## 2022-10-14 MED ORDER — SODIUM CHLORIDE 0.9 % IV SOLN
200.0000 mg | Freq: Once | INTRAVENOUS | Status: AC
  Administered 2022-10-14: 200 mg via INTRAVENOUS
  Filled 2022-10-14: qty 200

## 2022-10-14 MED ORDER — SODIUM CHLORIDE 0.9% FLUSH
10.0000 mL | INTRAVENOUS | Status: DC | PRN
  Administered 2022-10-14: 10 mL

## 2022-10-14 NOTE — Progress Notes (Signed)
Denies anyHematology and Oncology Follow Up Visit  NATASSJA OLLIS [id number] Jan 06, 1956 66 y.o. 10/14/2022 8:32 AM [name], Scherrie Bateman, Mathis Dad, MD   [name]: 87 year old woman with bladder cancer diagnosed in 2021.  She presented with stage IV adenocarcinoma with pelvic adenopathy and her tumor found to have PD-L1 CPS score of 35 pelvic adenopathy.  Prior Therapy:  She is status post retroperitoneal lymph node biopsy completed on 10/24/2020 which showed metastatic carcinoma.  She is status post repeat cystoscopy and bilateral ureteral stent exchange as well as resection of a bladder tumor on January 21, 2021.  A final pathology showed an adenocarcinoma.  Chemotherapy utilizing carboplatin and gemcitabine started on November 22, 2020.  She completed 6 cycles of therapy on March 06, 2021.  FOLFOX chemotherapy started on Apr 15, 2021.  She completed 12 cycles of therapy.  She is status post radiation therapy to a right psoas metastatic lesion. She will complete 50 Gray in 5 fractions on February 27, 2022.  5-FU and leucovorin chemotherapy every 2 weeks started in November 2022.  She received maintenance therapy till June 2023 due to progression of disease.  She is status post right hemicolectomy completed on July 25, 2022.  The final pathology carcinoma that is a similar to her GU primary.  She received 1 cycle of Taxol chemotherapy on June 10, 2022.  Current therapy: Pembrolizumab 200 milligrams every 3 weeks started on July 01, 2022.  He is here for cycle 6 of therapy.  Interim History: Ms. Mars returns today for a follow-up.  Since the last visit, she reports feeling well without any major complaints.  She has reported some occasional dizziness but no headaches or unsteadiness.  She denies any nausea, vomiting or chest pain.  She denies any hospitalizations or illnesses.  He denies any specific complications related to Pembrolizumab at this time.   Medications: Updated  on review. Current Outpatient Medications  Medication Sig Dispense Refill   albuterol (PROAIR HFA) 108 (90 Base) MCG/ACT inhaler INHALE 1 TO 2 PUFFS BY MOUTH INTO THE LUNGS EVERY 4 HOURS AS NEEDED FOR WHEEZING OR SHORTNESS OF BREATH 8 g 1   ARIPiprazole (ABILIFY) 10 MG tablet Take 1 tablet (10 mg total) by mouth daily. 90 tablet 0   buPROPion (WELLBUTRIN XL) 300 MG 24 hr tablet TAKE 1 TABLET BY MOUTH EVERY DAY 90 tablet 1   cephALEXin (KEFLEX) 250 MG capsule Take 1 capsule (250 mg total) by mouth at bedtime. 90 capsule 3   clonazePAM (KLONOPIN) 1 MG tablet Take 1 tablet (1 mg total) by mouth at bedtime. 30 tablet 1   cycloSPORINE (RESTASIS) 0.05 % ophthalmic emulsion Place 1 drop both eyes twice a day (Patient taking differently: Place 1 drop into both eyes daily.) 180 each 3   fexofenadine (ALLEGRA) 180 MG tablet Take 1 tablet (180 mg total) by mouth daily. 90 tablet 3   gabapentin (NEURONTIN) 300 MG capsule Take 1 capsule by mouth daily and 4 capsules at bedtime as directed. 450 capsule 1   HYDROcodone-acetaminophen (NORCO) 5-325 MG tablet Take 1 tablet by mouth every 6 (six) hours as needed for severe pain. 30 tablet 0   lidocaine-prilocaine (EMLA) cream Apply 1 application topically as needed. (Patient taking differently: Apply 1 application  topically as needed (for port access).) 30 g 0   Meth-Hyo-M Bl-Na Phos-Ph Sal (URIBEL) 118 MG CAPS Take one capsule by mouth 3 times daily (Patient not taking: Reported on 05/23/2022) 30 capsule 3   mirabegron ER (  MYRBETRIQ) 50 MG TB24 tablet Take 1 tablet by mouth daily. 30 tablet 3   nebivolol (BYSTOLIC) 10 MG tablet Take 1 tablet (10 mg total) by mouth daily. 90 tablet 1   omeprazole (PRILOSEC) 20 MG capsule Take 1 capsule (20 mg total) by mouth daily. 90 capsule 3   ondansetron (ZOFRAN) 4 MG tablet Take 1 tablet (4 mg total) by mouth every 8 (eight) hours as needed for nausea or vomiting. 40 tablet 3   oxybutynin (DITROPAN) 5 MG tablet Take 1 tablet (5  mg total) by mouth every 8 (eight) hours as needed. 90 tablet 3   oxyCODONE (OXY IR/ROXICODONE) 5 MG immediate release tablet Take 5 mg by mouth 4 (four) times daily as needed.     sertraline (ZOLOFT) 25 MG tablet Take 1 tablet (25 mg total) by mouth daily. 90 tablet 1   simvastatin (ZOCOR) 20 MG tablet TAKE 1 TABLET (20 MG TOTAL) BY MOUTH DAILY. 90 tablet 0   tamsulosin (FLOMAX) 0.4 MG CAPS capsule Take 1 capsule (0.4 mg total) by mouth at bedtime. 90 capsule 3   traMADol (ULTRAM) 50 MG tablet Take 1 tablet (50 mg total) by mouth every 12 (twelve) hours as needed for moderate pain. 180 tablet 0   No current facility-administered medications for this visit.     Allergies:  Allergies  Allergen Reactions   Metformin And Related Other (See Comments)    Lactic acidosis    Penicillins Hives and Rash    Reports hives to penicillin and amoxicillin as an adult "a long time ago." Has tolerated cefdinir (09/2020) and cefepime (10/2020) before.   Metformin Diarrhea    Extreme diarrhea and lactic acidosis   Penicillin G Sodium Hives and Rash      Physical Exam: Blood pressure 137/87, pulse 78, temperature 97.7 F (36.5 C), temperature source Temporal, resp. rate 16, height _0  (1.676 m), weight 153 lb 8 oz (69.6 kg), SpO2 98 %.     ECOG: 1   General appearance: Alert, awake without any distress. Head: Atraumatic without abnormalities Oropharynx: Without any thrush or ulcers. Eyes: No scleral icterus. Lymph nodes: No lymphadenopathy noted in the cervical, supraclavicular, or axillary nodes Heart:regular rate and rhythm, without any murmurs or gallops.   Lung: Clear to auscultation without any rhonchi, wheezes or dullness to percussion. Abdomin: Soft, nontender without any shifting dullness or ascites. Musculoskeletal: No clubbing or cyanosis. Neurological: No motor or sensory deficits. Skin: No rashes or lesions.                          Lab Results: Lab  Results  Component Value Date   WBC 6.3 09/23/2022   HGB 11.9 (L) 09/23/2022   HCT 34.4 (L) 09/23/2022   MCV 93.5 09/23/2022   PLT 142 (L) 09/23/2022     Chemistry      Component Value Date/Time   NA 128 (L) 09/23/2022 1022   NA 142 04/26/2018 1333   K 3.9 09/23/2022 1022   CL 97 (L) 09/23/2022 1022   CO2 26 09/23/2022 1022   BUN 12 09/23/2022 1022   BUN 22 04/26/2018 1333   CREATININE 1.12 (H) 09/23/2022 1022   CREATININE 1.32 (H) 06/04/2021 0000   GLU 87 02/07/2013 0000      Component Value Date/Time   CALCIUM 8.9 09/23/2022 1022   ALKPHOS 74 09/23/2022 1022   AST 18 09/23/2022 1022   ALT 14 09/23/2022 1022   BILITOT 0.3 09/23/2022  1022       Impression and Plan:    65 year old woman with:   1.  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  She continues to have reasonable response to Pembrolizumab without any major complications.  CT scan obtained September 2023 did not show any evidence of disease progression.  Complications related to Pembrolizumab reviewed and she is agreeable to continue.  We will update her staging scan in December 2023.  She is agreeable to proceed at this time.  It is possible that her dizziness related to Pembrolizumab but overall managing currently.   2.  IV access: Port-A-Cath remains in use without any issues.  3.  Antiemetics: No nausea or vomiting reported at this time.  Compazine is available to her.   4.  Renal function surveillance: Her kidney function remains to close to baseline.  We will continue to monitor on subsequent treatments.   5.  Goals of care: Therapy remains palliative although aggressive measures are warranted.  6.  Anemia: Related to malignancy and chemotherapy.  Her hemoglobin is adequate.  7.  Colon obstruction: She is status post surgical resection.  No GI symptoms related.   8.  Autoimmune considerations: These complications continue to be reiterated including  pneumonitis, colitis and thyroid disease.   9.  Follow-up: In 3 weeks for a follow-up visit.   30  minutes were dedicated to this encounter.  Time spent on reviewing laboratory data, update and outlining future plan of care discussion.  Zola Button, MD 11/1/20238:32 AM

## 2022-10-16 ENCOUNTER — Encounter (HOSPITAL_BASED_OUTPATIENT_CLINIC_OR_DEPARTMENT_OTHER): Payer: Self-pay | Admitting: Urology

## 2022-10-16 LAB — T4: T4, Total: 6.6 ug/dL (ref 4.5–12.0)

## 2022-10-16 NOTE — Progress Notes (Signed)
Spoke w/ via phone for pre-op interview--- pt Lab needs dos---- no              Lab results------ current lab results dated 11-01-20203 in epic (CBCdiff/ CMP) and current EKG in epic/ chart COVID test -----patient states asymptomatic no test needed Arrive at ------- 0830 on 10-20-2022 NPO after MN NO Solid Food.  Clear liquids from MN until--- 0730 Med rec completed Medications to take morning of surgery ----- wellbutrin, zoloft, allegra, bystolic, prilosec Diabetic medication ----- n/a Patient instructed no nail polish to be worn day of surgery Patient instructed to bring photo id and insurance card day of surgery Patient aware to have Driver (ride ) / caregiver for 24 hours after surgery -- spouse , raven Patient Special Instructions -----  asked to bring rescue inhaler dos Pre-Op special Istructions ----- n/a Patient verbalized understanding of instructions that were given at this phone interview. Patient denies shortness of breath, chest pain, fever, cough at this phone interview.

## 2022-10-19 ENCOUNTER — Encounter (HOSPITAL_COMMUNITY): Payer: Self-pay | Admitting: Anesthesiology

## 2022-10-19 ENCOUNTER — Other Ambulatory Visit (HOSPITAL_COMMUNITY): Payer: Self-pay

## 2022-10-19 NOTE — Anesthesia Preprocedure Evaluation (Signed)
Anesthesia Evaluation    Reviewed: Allergy & Precautions, Patient's Chart, lab work & pertinent test results, reviewed documented beta blocker date and time   History of Anesthesia Complications Negative for: history of anesthetic complications  Airway        Dental   Pulmonary Patient abstained from smoking., former smoker          Cardiovascular hypertension, Pt. on medications and Pt. on home beta blockers      Neuro/Psych   Anxiety Depression    negative neurological ROS     GI/Hepatic Neg liver ROS,GERD  Medicated and Controlled,,  Endo/Other  negative endocrine ROS    Renal/GU negative Renal ROS  negative genitourinary   Musculoskeletal  (+) Arthritis ,    Abdominal   Peds  Hematology negative hematology ROS (+)   Anesthesia Other Findings Day of surgery medications reviewed with patient.  Reproductive/Obstetrics negative OB ROS                             Anesthesia Physical Anesthesia Plan  ASA: 3  Anesthesia Plan: General   Post-op Pain Management: Tylenol PO (pre-op)*   Induction: Intravenous  PONV Risk Score and Plan: 3 and Treatment may vary due to age or medical condition, Ondansetron and Dexamethasone  Airway Management Planned: LMA  Additional Equipment: None  Intra-op Plan:   Post-operative Plan: Extubation in OR  Informed Consent:   Plan Discussed with:   Anesthesia Plan Comments:        Anesthesia Quick Evaluation

## 2022-10-20 ENCOUNTER — Ambulatory Visit (HOSPITAL_BASED_OUTPATIENT_CLINIC_OR_DEPARTMENT_OTHER)
Admission: RE | Admit: 2022-10-20 | Payer: No Typology Code available for payment source | Source: Ambulatory Visit | Admitting: Urology

## 2022-10-20 DIAGNOSIS — Z01818 Encounter for other preprocedural examination: Secondary | ICD-10-CM

## 2022-10-20 HISTORY — DX: Diverticulosis of large intestine without perforation or abscess without bleeding: K57.30

## 2022-10-20 HISTORY — DX: Unspecified hydronephrosis: N13.30

## 2022-10-20 HISTORY — DX: Personal history of adenomatous and serrated colon polyps: Z86.0101

## 2022-10-20 HISTORY — DX: Personal history of colonic polyps: Z86.010

## 2022-10-20 HISTORY — DX: Encounter for palliative care: Z51.5

## 2022-10-20 HISTORY — DX: Malignant neoplasm of unspecified ureter: C66.9

## 2022-10-20 HISTORY — DX: Encounter for antineoplastic chemotherapy: Z51.11

## 2022-10-20 HISTORY — DX: Presence of spectacles and contact lenses: Z97.3

## 2022-10-20 HISTORY — DX: Malignant neoplasm of bladder, unspecified: C79.9

## 2022-10-20 HISTORY — DX: Generalized anxiety disorder: F41.1

## 2022-10-20 HISTORY — DX: Malignant neoplasm of bladder, unspecified: C67.9

## 2022-10-20 HISTORY — DX: Unspecified osteoarthritis, unspecified site: M19.90

## 2022-10-20 HISTORY — DX: Neoplastic (malignant) related fatigue: R53.0

## 2022-10-20 SURGERY — CYSTOSCOPY, WITH RETROGRADE PYELOGRAM AND URETERAL STENT INSERTION
Anesthesia: General | Laterality: Bilateral

## 2022-10-23 ENCOUNTER — Other Ambulatory Visit: Payer: Self-pay | Admitting: Urology

## 2022-10-31 ENCOUNTER — Other Ambulatory Visit (HOSPITAL_COMMUNITY): Payer: Self-pay

## 2022-10-31 ENCOUNTER — Telehealth: Payer: Self-pay | Admitting: Oncology

## 2022-10-31 NOTE — Telephone Encounter (Signed)
Called patient regarding November and December appointments. Patient is notified. 

## 2022-11-04 ENCOUNTER — Telehealth: Payer: Self-pay | Admitting: *Deleted

## 2022-11-04 ENCOUNTER — Inpatient Hospital Stay: Payer: Medicare HMO

## 2022-11-04 ENCOUNTER — Inpatient Hospital Stay (HOSPITAL_BASED_OUTPATIENT_CLINIC_OR_DEPARTMENT_OTHER): Payer: Medicare HMO | Admitting: Oncology

## 2022-11-04 VITALS — BP 128/86 | HR 76 | Temp 97.7°F | Resp 18 | Ht 66.0 in | Wt 145.2 lb

## 2022-11-04 DIAGNOSIS — Z95828 Presence of other vascular implants and grafts: Secondary | ICD-10-CM

## 2022-11-04 DIAGNOSIS — C799 Secondary malignant neoplasm of unspecified site: Secondary | ICD-10-CM

## 2022-11-04 DIAGNOSIS — D6481 Anemia due to antineoplastic chemotherapy: Secondary | ICD-10-CM | POA: Diagnosis not present

## 2022-11-04 DIAGNOSIS — C7989 Secondary malignant neoplasm of other specified sites: Secondary | ICD-10-CM | POA: Diagnosis not present

## 2022-11-04 DIAGNOSIS — Z79899 Other long term (current) drug therapy: Secondary | ICD-10-CM | POA: Diagnosis not present

## 2022-11-04 DIAGNOSIS — Z5112 Encounter for antineoplastic immunotherapy: Secondary | ICD-10-CM | POA: Diagnosis not present

## 2022-11-04 DIAGNOSIS — C679 Malignant neoplasm of bladder, unspecified: Secondary | ICD-10-CM | POA: Diagnosis not present

## 2022-11-04 DIAGNOSIS — C669 Malignant neoplasm of unspecified ureter: Secondary | ICD-10-CM

## 2022-11-04 DIAGNOSIS — C801 Malignant (primary) neoplasm, unspecified: Secondary | ICD-10-CM

## 2022-11-04 DIAGNOSIS — Z923 Personal history of irradiation: Secondary | ICD-10-CM | POA: Diagnosis not present

## 2022-11-04 LAB — CMP (CANCER CENTER ONLY)
ALT: 27 U/L (ref 0–44)
AST: 33 U/L (ref 15–41)
Albumin: 4.4 g/dL (ref 3.5–5.0)
Alkaline Phosphatase: 74 U/L (ref 38–126)
Anion gap: 9 (ref 5–15)
BUN: 20 mg/dL (ref 8–23)
CO2: 23 mmol/L (ref 22–32)
Calcium: 9.1 mg/dL (ref 8.9–10.3)
Chloride: 99 mmol/L (ref 98–111)
Creatinine: 1.21 mg/dL — ABNORMAL HIGH (ref 0.44–1.00)
GFR, Estimated: 50 mL/min — ABNORMAL LOW (ref >60)
Glucose, Bld: 91 mg/dL (ref 70–99)
Potassium: 4.3 mmol/L (ref 3.5–5.1)
Sodium: 131 mmol/L — ABNORMAL LOW (ref 135–145)
Total Bilirubin: 0.7 mg/dL (ref 0.3–1.2)
Total Protein: 7.9 g/dL (ref 6.5–8.1)

## 2022-11-04 LAB — CBC WITH DIFFERENTIAL (CANCER CENTER ONLY)
Abs Immature Granulocytes: 0.02 10*3/uL (ref 0.00–0.07)
Basophils Absolute: 0.1 10*3/uL (ref 0.0–0.1)
Basophils Relative: 1 %
Eosinophils Absolute: 0.6 10*3/uL — ABNORMAL HIGH (ref 0.0–0.5)
Eosinophils Relative: 8 %
HCT: 36.1 % (ref 36.0–46.0)
Hemoglobin: 12.3 g/dL (ref 12.0–15.0)
Immature Granulocytes: 0 %
Lymphocytes Relative: 34 %
Lymphs Abs: 2.5 10*3/uL (ref 0.7–4.0)
MCH: 32.1 pg (ref 26.0–34.0)
MCHC: 34.1 g/dL (ref 30.0–36.0)
MCV: 94.3 fL (ref 80.0–100.0)
Monocytes Absolute: 0.8 10*3/uL (ref 0.1–1.0)
Monocytes Relative: 10 %
Neutro Abs: 3.4 10*3/uL (ref 1.7–7.7)
Neutrophils Relative %: 47 %
Platelet Count: 186 10*3/uL (ref 150–400)
RBC: 3.83 MIL/uL — ABNORMAL LOW (ref 3.87–5.11)
RDW: 13.5 % (ref 11.5–15.5)
WBC Count: 7.3 10*3/uL (ref 4.0–10.5)
nRBC: 0 % (ref 0.0–0.2)

## 2022-11-04 LAB — TSH: TSH: 2.065 u[IU]/mL (ref 0.350–4.500)

## 2022-11-04 MED ORDER — SODIUM CHLORIDE 0.9% FLUSH
10.0000 mL | Freq: Once | INTRAVENOUS | Status: AC
  Administered 2022-11-04: 10 mL

## 2022-11-04 MED ORDER — SODIUM CHLORIDE 0.9% FLUSH: 10.0000 mL | INTRAVENOUS | Status: DC | PRN

## 2022-11-04 MED ORDER — HEPARIN SOD (PORK) LOCK FLUSH 100 UNIT/ML IV SOLN: 500.0000 [IU] | Freq: Once | INTRAVENOUS | Status: DC | PRN

## 2022-11-04 MED ORDER — SODIUM CHLORIDE 0.9 % IV SOLN
200.0000 mg | Freq: Once | INTRAVENOUS | Status: AC
  Administered 2022-11-04: 200 mg via INTRAVENOUS
  Filled 2022-11-04: qty 200

## 2022-11-04 MED ORDER — SODIUM CHLORIDE 0.9 % IV SOLN: Freq: Once | INTRAVENOUS | Status: AC

## 2022-11-04 NOTE — Telephone Encounter (Signed)
Referral called to [facility]. Dr Tana Coast for bladder cancer

## 2022-11-04 NOTE — Progress Notes (Signed)
Ok to treat without CMP per Dr. Alen Blew today.

## 2022-11-04 NOTE — Progress Notes (Signed)
Denies anyHematology and Oncology Follow Up Visit  Mary Stark [id number] 04-28-1956 66 y.o. 11/04/2022 10:07 AM Mary Stark, Mary Stark, Mary Dad, MD   [name]: 53 year old woman with stage IV adenocarcinoma of the bladder diagnosed in 2021.  She presented with pelvic adenopathy with PD-L1 CPS score of 35.   Prior Therapy:  She is status post retroperitoneal lymph node biopsy completed on 10/24/2020 which showed metastatic carcinoma.  She is status post repeat cystoscopy and bilateral ureteral stent exchange as well as resection of a bladder tumor on January 21, 2021.  A final pathology showed an adenocarcinoma.  Chemotherapy utilizing carboplatin and gemcitabine started on November 22, 2020.  She completed 6 cycles of therapy on March 06, 2021.  FOLFOX chemotherapy started on Apr 15, 2021.  She completed 12 cycles of therapy.  She is status post radiation therapy to a right psoas metastatic lesion. She will complete 50 Gray in 5 fractions on February 27, 2022.  5-FU and leucovorin chemotherapy every 2 weeks started in November 2022.  She received maintenance therapy till June 2023 due to progression of disease.  She is status post right hemicolectomy completed on July 25, 2022.  The final pathology carcinoma that is a similar to her GU primary.  She received 1 cycle of Taxol chemotherapy on June 10, 2022.  Current therapy: Pembrolizumab 200 milligrams every 3 weeks started on July 01, 2022.  He is here for cycle 7 of therapy.  Interim History: Mary Stark presents today for a follow-up evaluation.  Since last visit, she reports no major changes in her health.  She continues to lose weight intentionally utilizing appetite suppressants as well as Mounjaro.  She denies any nausea, vomiting or abdominal pain.  She continues to have shortness of breath and dyspnea on exertion but no cough, wheezing or hemoptysis.  She denies any skin rashes or lesions.   Medications: Reviewed  without changes. Current Outpatient Medications  Medication Sig Dispense Refill   albuterol (PROAIR HFA) 108 (90 Base) MCG/ACT inhaler INHALE 1 TO 2 PUFFS BY MOUTH INTO THE LUNGS EVERY 4 HOURS AS NEEDED FOR WHEEZING OR SHORTNESS OF BREATH 8 g 1   ARIPiprazole (ABILIFY) 10 MG tablet Take 1 tablet (10 mg total) by mouth daily. (Patient taking differently: Take 10 mg by mouth at bedtime.) 90 tablet 0   buPROPion (WELLBUTRIN XL) 300 MG 24 hr tablet TAKE 1 TABLET BY MOUTH EVERY DAY (Patient taking differently: Take 300 mg by mouth daily.) 90 tablet 1   cephALEXin (KEFLEX) 250 MG capsule Take 1 capsule (250 mg total) by mouth at bedtime. 90 capsule 3   clonazePAM (KLONOPIN) 1 MG tablet Take 1 tablet (1 mg total) by mouth at bedtime. 30 tablet 1   cycloSPORINE (RESTASIS) 0.05 % ophthalmic emulsion Place 1 drop both eyes twice a day (Patient taking differently: Place 1 drop into both eyes 2 (two) times daily.) 180 each 3   fexofenadine (ALLEGRA) 180 MG tablet Take 1 tablet (180 mg total) by mouth daily. 90 tablet 3   gabapentin (NEURONTIN) 300 MG capsule Take 1 capsule by mouth daily and 4 capsules at bedtime as directed. (Patient taking differently: Take 4 capsules by mouth at bedtime. Take 1 capsule by mouth daily and 4 capsules at bedtime as directed.) 450 capsule 1   lidocaine-prilocaine (EMLA) cream Apply 1 application topically as needed. (Patient taking differently: Apply 1 application  topically as needed (for port access).) 30 g 0   Meth-Hyo-M Bl-Na Phos-Ph Sal (URIBEL)  118 MG CAPS Take one capsule by mouth 3 times daily (Patient taking differently: as needed (spasms).) 30 capsule 3   nebivolol (BYSTOLIC) 10 MG tablet Take 1 tablet (10 mg total) by mouth daily. (Patient taking differently: Take 10 mg by mouth daily.) 90 tablet 1   omeprazole (PRILOSEC) 20 MG capsule Take 1 capsule (20 mg total) by mouth daily. (Patient taking differently: Take 20 mg by mouth daily.) 90 capsule 3   ondansetron  (ZOFRAN) 4 MG tablet Take 1 tablet (4 mg total) by mouth every 8 (eight) hours as needed for nausea or vomiting. 40 tablet 3   oxybutynin (DITROPAN) 5 MG tablet Take 1 tablet (5 mg total) by mouth every 8 (eight) hours as needed. (Patient taking differently: Take 5 mg by mouth every 8 (eight) hours as needed for bladder spasms.) 90 tablet 3   phentermine 37.5 MG capsule Take 37.5 mg by mouth every morning.     sertraline (ZOLOFT) 25 MG tablet Take 1 tablet (25 mg total) by mouth daily. (Patient taking differently: Take 25 mg by mouth daily.) 90 tablet 1   simvastatin (ZOCOR) 20 MG tablet TAKE 1 TABLET (20 MG TOTAL) BY MOUTH DAILY. (Patient taking differently: Take 20 mg by mouth at bedtime.) 90 tablet 0   tamsulosin (FLOMAX) 0.4 MG CAPS capsule Take 1 capsule (0.4 mg total) by mouth at bedtime. 90 capsule 3   traMADol (ULTRAM) 50 MG tablet Take 1 tablet (50 mg total) by mouth every 12 (twelve) hours as needed for moderate pain. 180 tablet 0   No current facility-administered medications for this visit.     Allergies:  Allergies  Allergen Reactions   Penicillins Hives and Rash    Reports hives to penicillin and amoxicillin as an adult "a long time ago." Has tolerated cefdinir (09/2020) and cefepime (10/2020) before.   Metformin Diarrhea    Extreme diarrhea and lactic acidosis      Physical Exam:   Blood pressure 128/86, pulse 76, temperature 97.7 F (36.5 C), temperature source Temporal, resp. rate 18, height _0  (1.676 m), weight 145 lb 3.2 oz (65.9 kg), SpO2 100 %.    ECOG: 1    General appearance: Alert, awake without any distress. Head: Atraumatic without abnormalities Oropharynx: Without any thrush or ulcers. Eyes: No scleral icterus. Lymph nodes: No lymphadenopathy noted in the cervical, supraclavicular, or axillary nodes Heart:regular rate and rhythm, without any murmurs or gallops.   Lung: Clear to auscultation without any rhonchi, wheezes or dullness to  percussion. Abdomin: Soft, nontender without any shifting dullness or ascites. Musculoskeletal: No clubbing or cyanosis. Neurological: No motor or sensory deficits. Skin: No rashes or lesions.                          Lab Results: Lab Results  Component Value Date   WBC 7.5 10/14/2022   HGB 11.5 (L) 10/14/2022   HCT 33.8 (L) 10/14/2022   MCV 95.2 10/14/2022   PLT 172 10/14/2022     Chemistry      Component Value Date/Time   NA 132 (L) 10/14/2022 0856   NA 142 04/26/2018 1333   K 4.3 10/14/2022 0856   CL 99 10/14/2022 0856   CO2 27 10/14/2022 0856   BUN 15 10/14/2022 0856   BUN 22 04/26/2018 1333   CREATININE 1.03 (H) 10/14/2022 0856   CREATININE 1.32 (H) 06/04/2021 0000   GLU 87 02/07/2013 0000      Component Value  Date/Time   CALCIUM 9.0 10/14/2022 0856   ALKPHOS 74 10/14/2022 0856   AST 17 10/14/2022 0856   ALT 15 10/14/2022 0856   BILITOT 0.2 (L) 10/14/2022 0856       Impression and Plan:    66 year old woman with:   1.  Bladder cancer diagnosed in 2021.  She presented with stage IV adenocarcinoma and pelvic adenopathy.   The natural course of her disease was reviewed and treatment options were discussed at this time.  She continues to be on Pembrolizumab with reasonable response and tolerance.  Risks and benefits of continuing this treatment were discussed.  Complications including autoimmune considerations, GI toxicity and dermatological issues.  I recommended updating her staging scan in December 2023.  Different salvage therapy options including traditional chemotherapy with taxane, FOLFIRI or repeat radiation.   2.  IV access: Port-A-Cath continues to be in use without any issues.   3.  Shortness of breath: Workup is currently ongoing.  There is no evidence suggest pneumonitis and previous imaging studies and clinically her symptoms do not support this diagnosis.  Will update her staging scan which will help at this time.   4.   Goals of care: Her disease is incurable although aggressive measures are warranted given her excellent performance status.  6.  Anemia: Related to malignancy and chemotherapy.  Her hemoglobin is back to normal at this time.  7.  Colon obstruction: Related to recurrent tumor and status postsurgical resection.  No GI complaints at this time.   8.  Autoimmune considerations: His complications were reiterated including pneumonitis, colitis and thyroid disease.  She has not experienced any including hypophysitis or hepatitis.   9.  Follow-up: In 3 weeks for the next cycle of therapy.   30  minutes were spent on this visit.  The time was dedicated to updating disease status, treatment choices and future plan of care review.  Zola Button, MD 11/22/202310:07 AM

## 2022-11-06 ENCOUNTER — Telehealth: Payer: Self-pay | Admitting: *Deleted

## 2022-11-06 NOTE — Telephone Encounter (Signed)
PC to patient, no answer, left VM - informed patient her CT scan has been authorized by insurance & may be scheduled at any time by [employer], [phone].  Instructed patient to call this office with any questions/concerns, [phone].

## 2022-11-10 ENCOUNTER — Encounter (HOSPITAL_BASED_OUTPATIENT_CLINIC_OR_DEPARTMENT_OTHER): Payer: Self-pay | Admitting: Urology

## 2022-11-10 ENCOUNTER — Telehealth: Payer: Self-pay | Admitting: *Deleted

## 2022-11-10 NOTE — Telephone Encounter (Signed)
PC to patient, informed her her CT has been approved by insurance & may be scheduled by [employer], [phone].  She verbalizes understanding.

## 2022-11-10 NOTE — Progress Notes (Signed)
Spoke w/ via phone for pre-op interview--- [facility] needs dos---- NONE              Lab results------Current EKG dated 10/2022 in [facility] test -----patient states asymptomatic no test needed Arrive at -------0830 NPO after MN NO Solid Food.  Clear liquids from MN until---0730 Med rec completed Medications to take morning of surgery ----- Bystolic, Wellbutrin and Zoloft Diabetic medication ----- Patient instructed no nail polish to be worn day of surgery Patient instructed to bring photo id and insurance card day of surgery Patient aware to have Driver (ride ) / caregiver Husband Larey Days   for 24 hours after surgery  Patient Special Instructions ----- Pre-Op special Istructions ----- Patient taking Phentermine pt stated has been holding since 11/03/22. Patient is also on Mounjaro normally takes on Fridays, instructed to hold this Fridays dose and may re-start next Friday after surgery. Patient verbalized understanding of instructions. Patient verbalized understanding of instructions that were given at this phone interview. Patient denies shortness of breath, chest pain, fever, cough at this phone interview.

## 2022-11-11 ENCOUNTER — Encounter: Payer: Self-pay | Admitting: Oncology

## 2022-11-11 DIAGNOSIS — R8271 Bacteriuria: Secondary | ICD-10-CM | POA: Diagnosis not present

## 2022-11-11 DIAGNOSIS — N130 Hydronephrosis with ureteropelvic junction obstruction: Secondary | ICD-10-CM | POA: Diagnosis not present

## 2022-11-12 DIAGNOSIS — C675 Malignant neoplasm of bladder neck: Secondary | ICD-10-CM | POA: Diagnosis not present

## 2022-11-12 DIAGNOSIS — C676 Malignant neoplasm of ureteric orifice: Secondary | ICD-10-CM | POA: Diagnosis not present

## 2022-11-13 ENCOUNTER — Ambulatory Visit (HOSPITAL_COMMUNITY): Payer: Medicare HMO | Attending: Medical-Surgical

## 2022-11-13 DIAGNOSIS — I951 Orthostatic hypotension: Secondary | ICD-10-CM | POA: Diagnosis not present

## 2022-11-13 LAB — ECHOCARDIOGRAM COMPLETE
Area-P 1/2: 3.17 cm2
S' Lateral: 3.4 cm

## 2022-11-17 ENCOUNTER — Ambulatory Visit (HOSPITAL_BASED_OUTPATIENT_CLINIC_OR_DEPARTMENT_OTHER): Payer: Medicare HMO | Admitting: Anesthesiology

## 2022-11-17 ENCOUNTER — Encounter (HOSPITAL_BASED_OUTPATIENT_CLINIC_OR_DEPARTMENT_OTHER): Payer: Self-pay | Admitting: Urology

## 2022-11-17 ENCOUNTER — Other Ambulatory Visit: Payer: Self-pay

## 2022-11-17 ENCOUNTER — Ambulatory Visit (HOSPITAL_BASED_OUTPATIENT_CLINIC_OR_DEPARTMENT_OTHER)
Admission: RE | Admit: 2022-11-17 | Discharge: 2022-11-17 | Disposition: A | Discharge status: Home / Self Care | Payer: Medicare HMO | Source: Ambulatory Visit | Attending: Urology | Admitting: Urology

## 2022-11-17 ENCOUNTER — Encounter (HOSPITAL_BASED_OUTPATIENT_CLINIC_OR_DEPARTMENT_OTHER)
Admission: RE | Disposition: A | Discharge status: Home / Self Care | Payer: Self-pay | Source: Ambulatory Visit | Attending: Urology

## 2022-11-17 DIAGNOSIS — F32A Depression, unspecified: Secondary | ICD-10-CM | POA: Insufficient documentation

## 2022-11-17 DIAGNOSIS — Z8551 Personal history of malignant neoplasm of bladder: Secondary | ICD-10-CM | POA: Diagnosis not present

## 2022-11-17 DIAGNOSIS — F419 Anxiety disorder, unspecified: Secondary | ICD-10-CM | POA: Diagnosis not present

## 2022-11-17 DIAGNOSIS — I10 Essential (primary) hypertension: Secondary | ICD-10-CM | POA: Insufficient documentation

## 2022-11-17 DIAGNOSIS — B1920 Unspecified viral hepatitis C without hepatic coma: Secondary | ICD-10-CM | POA: Diagnosis not present

## 2022-11-17 DIAGNOSIS — K219 Gastro-esophageal reflux disease without esophagitis: Secondary | ICD-10-CM | POA: Diagnosis not present

## 2022-11-17 DIAGNOSIS — Z87891 Personal history of nicotine dependence: Secondary | ICD-10-CM | POA: Diagnosis not present

## 2022-11-17 DIAGNOSIS — N12 Tubulo-interstitial nephritis, not specified as acute or chronic: Secondary | ICD-10-CM | POA: Diagnosis not present

## 2022-11-17 DIAGNOSIS — N1330 Unspecified hydronephrosis: Secondary | ICD-10-CM | POA: Diagnosis not present

## 2022-11-17 DIAGNOSIS — M1990 Unspecified osteoarthritis, unspecified site: Secondary | ICD-10-CM | POA: Diagnosis not present

## 2022-11-17 DIAGNOSIS — F418 Other specified anxiety disorders: Secondary | ICD-10-CM

## 2022-11-17 HISTORY — PX: CYSTOSCOPY W/ URETERAL STENT PLACEMENT: SHX1429

## 2022-11-17 SURGERY — CYSTOSCOPY, WITH RETROGRADE PYELOGRAM AND URETERAL STENT INSERTION
Anesthesia: General | Site: Ureter | Laterality: Bilateral

## 2022-11-17 MED ORDER — EPHEDRINE SULFATE-NACL 50-0.9 MG/10ML-% IV SOSY
PREFILLED_SYRINGE | INTRAVENOUS | Status: DC | PRN
  Administered 2022-11-17: 5 mg via INTRAVENOUS

## 2022-11-17 MED ORDER — PROPOFOL 500 MG/50ML IV EMUL
INTRAVENOUS | Status: AC
  Filled 2022-11-17: qty 50

## 2022-11-17 MED ORDER — FENTANYL CITRATE (PF) 100 MCG/2ML IJ SOLN: 25.0000 ug | INTRAMUSCULAR | Status: DC | PRN

## 2022-11-17 MED ORDER — PROPOFOL 10 MG/ML IV BOLUS
INTRAVENOUS | Status: DC | PRN
  Administered 2022-11-17: 160 mg via INTRAVENOUS

## 2022-11-17 MED ORDER — LIDOCAINE 2% (20 MG/ML) 5 ML SYRINGE
INTRAMUSCULAR | Status: DC | PRN
  Administered 2022-11-17: 40 mg via INTRAVENOUS

## 2022-11-17 MED ORDER — SODIUM CHLORIDE 0.9 % IV SOLN: INTRAVENOUS | Status: DC

## 2022-11-17 MED ORDER — IOHEXOL 300 MG/ML  SOLN
INTRAMUSCULAR | Status: DC | PRN
  Administered 2022-11-17: 17 mL via URETHRAL

## 2022-11-17 MED ORDER — PROPOFOL 10 MG/ML IV BOLUS
INTRAVENOUS | Status: AC
  Filled 2022-11-17: qty 20

## 2022-11-17 MED ORDER — AMISULPRIDE (ANTIEMETIC) 5 MG/2ML IV SOLN: 10.0000 mg | Freq: Once | INTRAVENOUS | Status: DC | PRN

## 2022-11-17 MED ORDER — ONDANSETRON HCL 4 MG/2ML IJ SOLN
INTRAMUSCULAR | Status: DC | PRN
  Administered 2022-11-17: 4 mg via INTRAVENOUS

## 2022-11-17 MED ORDER — MIDAZOLAM HCL 2 MG/2ML IJ SOLN
INTRAMUSCULAR | Status: AC
  Filled 2022-11-17: qty 2

## 2022-11-17 MED ORDER — CIPROFLOXACIN IN D5W 400 MG/200ML IV SOLN
INTRAVENOUS | Status: AC
  Filled 2022-11-17: qty 200

## 2022-11-17 MED ORDER — SODIUM CHLORIDE 0.9 % IR SOLN
Status: DC | PRN
  Administered 2022-11-17: 3000 mL

## 2022-11-17 MED ORDER — OXYCODONE HCL 5 MG PO TABS: 5.0000 mg | ORAL_TABLET | Freq: Once | ORAL | Status: DC | PRN

## 2022-11-17 MED ORDER — DEXAMETHASONE SODIUM PHOSPHATE 10 MG/ML IJ SOLN
INTRAMUSCULAR | Status: DC | PRN
  Administered 2022-11-17: 5 mg via INTRAVENOUS

## 2022-11-17 MED ORDER — FENTANYL CITRATE (PF) 250 MCG/5ML IJ SOLN
INTRAMUSCULAR | Status: DC | PRN
  Administered 2022-11-17: 25 ug via INTRAVENOUS

## 2022-11-17 MED ORDER — FENTANYL CITRATE (PF) 100 MCG/2ML IJ SOLN
INTRAMUSCULAR | Status: AC
  Filled 2022-11-17: qty 2

## 2022-11-17 MED ORDER — CIPROFLOXACIN IN D5W 400 MG/200ML IV SOLN
INTRAVENOUS | Status: DC | PRN
  Administered 2022-11-17: 400 mg via INTRAVENOUS

## 2022-11-17 MED ORDER — ACETAMINOPHEN 10 MG/ML IV SOLN: 1000.0000 mg | Freq: Once | INTRAVENOUS | Status: DC | PRN

## 2022-11-17 MED ORDER — OXYCODONE HCL 5 MG/5ML PO SOLN: 5.0000 mg | Freq: Once | ORAL | Status: DC | PRN

## 2022-11-17 MED ORDER — ACETAMINOPHEN 160 MG/5ML PO SOLN: 325.0000 mg | ORAL | Status: DC | PRN

## 2022-11-17 MED ORDER — ACETAMINOPHEN 325 MG PO TABS: 325.0000 mg | ORAL_TABLET | ORAL | Status: DC | PRN

## 2022-11-17 MED ORDER — MIDAZOLAM HCL 2 MG/2ML IJ SOLN
INTRAMUSCULAR | Status: DC | PRN
  Administered 2022-11-17: 2 mg via INTRAVENOUS

## 2022-11-17 SURGICAL SUPPLY — 15 items
BAG DRAIN URO-CYSTO SKYTR STRL (DRAIN) ×1 IMPLANT
BAG DRN UROCATH (DRAIN) ×1
CATH URETL OPEN 5X70 (CATHETERS) ×1 IMPLANT
CLOTH BEACON ORANGE TIMEOUT ST (SAFETY) ×1 IMPLANT
GLOVE BIO SURGEON STRL SZ 6.5 (GLOVE) ×1 IMPLANT
GLOVE BIOGEL PI IND STRL 6 (GLOVE) IMPLANT
GOWN STRL REUS W/ TWL LRG LVL3 (GOWN DISPOSABLE) IMPLANT
GOWN STRL REUS W/TWL LRG LVL3 (GOWN DISPOSABLE) ×2 IMPLANT
GUIDEWIRE STR DUAL SENSOR (WIRE) ×1 IMPLANT
IV NS IRRIG 3000ML ARTHROMATIC (IV SOLUTION) ×1 IMPLANT
KIT TURNOVER CYSTO (KITS) ×1 IMPLANT
MANIFOLD NEPTUNE II (INSTRUMENTS) ×1 IMPLANT
PACK CYSTO (CUSTOM PROCEDURE TRAY) ×1 IMPLANT
STENT URET 6FRX24 CONTOUR (STENTS) IMPLANT
TUBE CONNECTING 12X1/4 (SUCTIONS) ×1 IMPLANT

## 2022-11-17 NOTE — OR Nursing (Signed)
Bilateral ureteral stents were removed by Dr. Claudia Desanctis

## 2022-11-17 NOTE — Anesthesia Procedure Notes (Signed)
Procedure Name: LMA Insertion Date/Time: 11/17/2022 10:59 AM  Performed by: [name], CRNAPre-anesthesia Checklist: Patient identified, Emergency Drugs available, Suction available and Patient being monitored Patient Re-evaluated:Patient Re-evaluated prior to induction Oxygen Delivery Method: Circle System Utilized Preoxygenation: Pre-oxygenation with 100% oxygen Induction Type: IV induction Ventilation: Mask ventilation without difficulty LMA: LMA inserted LMA Size: 4.0 Number of attempts: 1 Airway Equipment and Method: Bite block Placement Confirmation: positive ETCO2 Tube secured with: Tape Dental Injury: Teeth and Oropharynx as per pre-operative assessment

## 2022-11-17 NOTE — Discharge Instructions (Addendum)
Post stent placement instructions   Definitions:  Ureter: The duct that transports urine from the kidney to the bladder. Stent: A plastic hollow tube that is placed into the ureter, from the kidney to the bladder to prevent the ureter from swelling shut.  General instructions:  Despite the fact that no skin incisions were used, the area around the ureter and bladder is raw and irritated. The stent is a foreign body which can further irritate the bladder wall. This irritation is manifested by increased frequency of urination, both day and night, and by an increase in the urge to urinate. In some, the urge to urinate is present almost always. Sometimes the urge is strong enough that you may not be able to stop your self from urinating. This can often be controlled with medication but does not occur in everyone. A stent can safely be left in place for 3 months or greater.  You may see some blood in your urine while the stent is in place and a few days afterward. Do not be alarmed, even if the urine is clear for a while. Get off your feet and drink lots of fluids until clearing occurs. If you start to pass clots or don't improve, call us.  Diet:  You may return to your normal diet immediately. Because of the raw surface of your bladder, alcohol, spicy foods, foods high in acid and drinks with caffeine may cause irritation or frequency and should be used in moderation. To keep your urine flowing freely and avoid constipation, drink plenty of fluids during the day (8-10 glasses). Tip: Avoid cranberry juice because it is very acidic.  Activity:  Your physical activity doesn't need to be restricted. However, if you are very active, you may see some blood in the urine. We suggest that you reduce your activity under the circumstances until the bleeding has stopped.  Bowels:  It is important to keep your bowels regular during the postoperative period. Straining with bowel movements can cause bleeding. A  bowel movement every other day is reasonable. Use a mild laxative if needed, such as milk of magnesia 2-3 tablespoons, or 2 Dulcolax tablets. Call if you continue to have problems. If you had been taking narcotics for pain, before, during or after your surgery, you may be constipated. Take a laxative if necessary.  Medication:  You should resume your pre-surgery medications unless told not to. In addition you may be given an antibiotic to prevent or treat infection. Antibiotics are not always necessary. All medication should be taken as prescribed until the bottles are finished unless you are having an unusual reaction to one of the drugs.  Problems you should report to [place]:  a. Fever greater than 101F. b. Heavy bleeding, or clots (see notes above about blood in urine). c. Inability to urinate. d. Drug reactions (hives, rash, nausea, vomiting, diarrhea). e. Severe burning or pain with urination that is not improving.       Post Anesthesia Home Care Instructions  Activity: Get plenty of rest for the remainder of the day. A responsible individual must stay with you for 24 hours following the procedure.  For the next 24 hours, DO NOT: -Drive a car -Paediatric nurse -Drink alcoholic beverages -Take any medication unless instructed by your physician -Make any legal decisions or sign important papers.  Meals: Start with liquid foods such as gelatin or soup. Progress to regular foods as tolerated. Avoid greasy, spicy, heavy foods. If nausea and/or vomiting occur, drink only clear  liquids until the nausea and/or vomiting subsides. Call your physician if vomiting continues.  Special Instructions/Symptoms: Your throat may feel dry or sore from the anesthesia or the breathing tube placed in your throat during surgery. If this causes discomfort, gargle with warm salt water. The discomfort should disappear within 24 hours.

## 2022-11-17 NOTE — H&P (Signed)
CC/HPI: cc: bilateral hydronephrosis, metastatic squamous cell carcinoma    10/09/21: 66 year old woman initially seen for incidental finding of asymptomatic right hydronephrosis which then progressed to bilateral hydronephrosis and underwent diagnostic ureteroscopy x2 the last being 07/05/2020. Biopsies will also ultimately negative for malignancy and we discussed a trial of steroids to see if this was some usual inflammatory process. Repeat CT scan of the abdomen pelvis were performed today to reassess hydronephrosis and inflammation. She then underwent retroperitoneal lymph node biopsy by IR and was found to have metastatic cancer either GU or gyn primary. She continues chemotherapy under the management of Dr. Alen Blew. During her last stent exchange she was noted to have a mass at the left ureteral orifice and biopsy showed squamous cell carcinoma. Patient's last stent exchange June 2022. She has been maintained on daily cephalexin for UTI prophylaxis. Following patient's last exchange she was hospitalized for 4 days for urosepsis.   01/28/22: Patient is doing wonderfully after her last stent exchange and has had very minimal voiding complaints. She would like to see if she can keep her current stents for a longer period of time.   04/15/2022: 66 year old woman with the above-noted medical history who presents for preoperative appointment prior to undergoing stent exchange. She is currently doing well. She denies fevers, chills, urinary tract infection. Denies chest pain, shortness of breath. Denies recent changes or medication.   07/31/2022: 66 year old woman with a history of metastatic adenocarcinoma resulting in bilateral hydronephrosis managed with indwelling ureteral stents here for follow-up. Her stents were last changed in May 2023. She is doing well overall and has started on Pembrolizumab.She is on daily Keflex for UTI prevention and also Myrbetriq for urgency from stents.   11/11/2022: Mary Stark is a  66 year old female who presents today for preoperative appointment prior to undergoing bilateral stent exchange due to bilateral hydronephrosis. She was on daily Keflex but has finished the course of this medication. She reports that she has been doing well and tolerating the stents. She has not been able to afford Myrbetriq but does have as needed oxybutynin. Unfortunately, Dr. Chriss Driver will be leaving and that is her oncologist. She is going to be transferring her care to [facility]: Metformin penicillin    MEDICATIONS: Myrbetriq 25 mg tablet, extended release 24 hr TAKE 1 TABLET BY MOUTH ONCE A DAY  Myrbetriq 25 mg tablet, extended release 24 hr TAKE 1 TABLET BY MOUTH ONCE A DAY  Oxybutynin Chloride 5 mg tablet TAKE 1 TABLET BY MOUTH EVERY 8 HOURS AS NEEDED  Oxybutynin Chloride 5 mg tablet TAKE 1 TABLET BY MOUTH EVERY 8 HOURS AS NEEDED  Oxybutynin Chloride 5 mg tablet TAKE 1 TABLET BY MOUTH EVERY 8 HOURS AS NEEDED  Tamsulosin Hcl 0.4 mg capsule TAKE 1 CAPSULE BY MOUTH AT BEDTIME  Tamsulosin Hcl 0.4 mg capsule TAKE 1 CAPSULE BY MOUTH AT BEDTIME  Abilify  Bystolic  Cephalexin 161 mg capsule 1 capsule PO Q HS  Klonopin  Lexapro 5 mg tablet  Neurontin  Wellbutrin Sr     GU PSH: Cysto Uretero Biopsy Fulgura, Bilateral - 2021, Right - 2021 Cystoscopy Insert Stent - 04/28/2022, 10/28/2021, Bilateral - 05/20/2021, Bilateral - 2022, Bilateral - 2021, Bilateral - 2021 Cystoscopy TURBT <2 cm - 2022 Locm 300-'399Mg'$ /Ml Iodine,1Ml - 2021, 2021     NON-GU PSH: Hand/finger Surgery Tonsillectomy     GU PMH: Hydronephrosis - 07/31/2022, - 04/15/2022, - 01/28/2022, - 10/09/2021, - 06/06/2021, - 2022, - 2022, CT  reveals worsening left hydro and enhancing material in right renal pelvis concerning for malignancy. Overall she also has increasing lymphadenopathy in retroperitoneum as well as edema in sacral/peri-rectal regions. The patient recently completed steroids with taper and also  had COVID still on O2. We discussed the findings and lack of certainty if this is malignancy vs inflammatory. Will see if IR can biopsy retroperitoneal LAD. Called patient and discussed this In detail. Stents to remain in place for now. , - 2021, Based on noncontrast CT the abdomen pelvis is difficult to further evaluate ureters. We discussed obtaining a BMP to look at renal function and proceeding with a CT urogram. If hydronephrosis has resolved then no further investigating will be done however if hydronephrosis is still present we did discuss proceeding with cystoscopy, retrograde pyelogram and diagnostic ureteroscopy. I will follow-up the patient after the CT scan., - 2021 Acute Cystitis/UTI - 4/81/8563 Right uncertain neoplasm of kidney - 2021 Flank Pain - 2021    NON-GU PMH: Metastatic lymphadenopathy of multiple regions (Stable) - 10/09/2021, - 2022 Anxiety Arthritis Depression Encounter for general adult medical examination without abnormal findings, Encounter for preventive health examination Hepatitis C Hypercholesterolemia Hypertension    FAMILY HISTORY: Kidney Stones - Grandfather Protein S Deficiency - Brother   SOCIAL HISTORY: Marital Status: Married Preferred Language: English; Ethnicity: Not Hispanic Or Latino; Race: White Current Smoking Status: Patient does not smoke anymore.   Tobacco Use Assessment Completed: Used Tobacco in last 30 days? Social Drinker.  Drinks 4+ caffeinated drinks per day.    REVIEW OF SYSTEMS:    GU Review Female:   Patient reports frequent urination. Patient denies stream starts and stops, burning /pain with urination, leakage of urine, trouble starting your stream, get up at night to urinate, being pregnant, have to strain to urinate, and hard to postpone urination.  Gastrointestinal (Upper):   Patient denies nausea, vomiting, and indigestion/ heartburn.  Gastrointestinal (Lower):   Patient denies diarrhea and constipation.  Constitutional:    Patient denies fever, night sweats, weight loss, and fatigue.  Skin:   Patient denies skin rash/ lesion and itching.  Eyes:   Patient denies blurred vision and double vision.  Ears/ Nose/ Throat:   Patient denies sore throat and sinus problems.  Hematologic/Lymphatic:   Patient denies swollen glands and easy bruising.  Cardiovascular:   Patient denies leg swelling and chest pains.  Respiratory:   Patient denies cough and shortness of breath.  Endocrine:   Patient denies excessive thirst.  Musculoskeletal:   Patient denies back pain and joint pain.  Neurological:   Patient denies headaches and dizziness.  Psychologic:   Patient denies depression and anxiety.   VITAL SIGNS: None   MULTI-SYSTEM PHYSICAL EXAMINATION:    Constitutional: Well-nourished. No physical deformities. Normally developed. Good grooming.  Neck: Neck symmetrical, not swollen. Normal tracheal position.  Respiratory: No labored breathing, no use of accessory muscles.   Cardiovascular: Normal temperature, normal extremity pulses, no swelling, no varicosities.  Skin: No paleness, no jaundice, no cyanosis. No lesion, no ulcer, no rash.  Neurologic / Psychiatric: Oriented to time, oriented to place, oriented to person. No depression, no anxiety, no agitation.  Gastrointestinal: No mass, no tenderness, no rigidity, non obese abdomen.  Ears, Nose, Mouth, and Throat: Left ear no scars, no lesions, no masses. Right ear no scars, no lesions, no masses. Nose no scars, no lesions, no masses. Normal hearing. Normal lips.  Musculoskeletal: Normal gait and station of head and neck.  Complexity of Data:  Source Of History:  Patient  Records Review:   Previous Doctor Records, Previous Patient Records  Urine Test Review:   Urinalysis   11/11/22  Urinalysis  Urine Appearance Clear   Urine Color Yellow   Urine Glucose Neg mg/dL  Urine Bilirubin Neg mg/dL  Urine Ketones Neg mg/dL  Urine Specific Gravity 1.010   Urine Blood Neg  ery/uL  Urine pH 6.0   Urine Protein Neg mg/dL  Urine Urobilinogen 0.2 mg/dL  Urine Nitrites Neg   Urine Leukocyte Esterase 1+ leu/uL  Urine WBC/hpf 0 - 5/hpf   Urine RBC/hpf NS (Not Seen)   Urine Epithelial Cells 0 - 5/hpf   Urine Bacteria Rare (0-9/hpf)   Urine Mucous Not Present   Urine Yeast NS (Not Seen)   Urine Trichomonas Not Present   Urine Cystals NS (Not Seen)   Urine Casts NS (Not Seen)   Urine Sperm Not Present    PROCEDURES:          Urinalysis w/Scope Dipstick Dipstick Cont'd Micro  Color: Yellow Bilirubin: Neg mg/dL WBC/hpf: 0 - 5/hpf  Appearance: Clear Ketones: Neg mg/dL RBC/hpf: NS (Not Seen)  Specific Gravity: 1.010 Blood: Neg ery/uL Bacteria: Rare (0-9/hpf)  pH: 6.0 Protein: Neg mg/dL Cystals: NS (Not Seen)  Glucose: Neg mg/dL Urobilinogen: 0.2 mg/dL Casts: NS (Not Seen)    Nitrites: Neg Trichomonas: Not Present    Leukocyte Esterase: 1+ leu/uL Mucous: Not Present      Epithelial Cells: 0 - 5/hpf      Yeast: NS (Not Seen)      Sperm: Not Present    Notes: QNS for spun micro    ASSESSMENT:      ICD-10 Details  1 GU:   Hydronephrosis - N13.0 Chronic, Stable   PLAN:           Orders Labs CULTURE, URINE          Document Letter(s):  Created for Patient: Clinical Summary         Notes:   All of her questions were answered to the best of my ability. I will discuss with her urologist if further antibiotics are warranted. Her urine was sent for culture today. She will keep her surgery for bilateral stent exchange as scheduled on 11/17/2022.

## 2022-11-17 NOTE — Anesthesia Preprocedure Evaluation (Addendum)
Anesthesia Evaluation  Patient identified by MRN, date of birth, ID band Patient awake    Reviewed: Allergy & Precautions, NPO status , Patient's Chart, lab work & pertinent test results  Airway Mallampati: II  TM Distance: >3 FB Neck ROM: Full    Dental  (+) Teeth Intact, Dental Advisory Given   Pulmonary Patient abstained from smoking., former smoker   breath sounds clear to auscultation       Cardiovascular hypertension,  Rhythm:Regular Rate:Normal     Neuro/Psych  PSYCHIATRIC DISORDERS Anxiety Depression       GI/Hepatic ,GERD  ,,(+) Hepatitis -, C  Endo/Other  negative endocrine ROS    Renal/GU Renal disease     Musculoskeletal  (+) Arthritis ,    Abdominal   Peds  Hematology negative hematology ROS (+)   Anesthesia Other Findings - Metastatic Bladder Cancer  Reproductive/Obstetrics                             Anesthesia Physical Anesthesia Plan  ASA: 3  Anesthesia Plan: General   Post-op Pain Management:    Induction: Intravenous  PONV Risk Score and Plan: 4 or greater and Ondansetron, Dexamethasone, Midazolam and Scopolamine patch - Pre-op  Airway Management Planned: LMA and Oral ETT  Additional Equipment: None  Intra-op Plan:   Post-operative Plan: Extubation in OR  Informed Consent: I have reviewed the patients History and Physical, chart, labs and discussed the procedure including the risks, benefits and alternatives for the proposed anesthesia with the patient or authorized representative who has indicated his/her understanding and acceptance.       Plan Discussed with: CRNA  Anesthesia Plan Comments:        Anesthesia Quick Evaluation

## 2022-11-17 NOTE — Interval H&P Note (Signed)
History and Physical Interval Note:  11/17/2022 10:38 AM  Mary Stark  has presented today for surgery, with the diagnosis of BILATERAL HYDRONEPHROSIS.  The various methods of treatment have been discussed with the patient and family. After consideration of risks, benefits and other options for treatment, the patient has consented to  Procedure(s) with comments: CYSTOSCOPY WITH RETROGRADE PYELOGRAM/URETERAL STENT REPLACEMENT (Bilateral) - 45 MINS as a surgical intervention.  The patient's history has been reviewed, patient examined, no change in status, stable for surgery.  I have reviewed the patient's chart and labs.  Questions were answered to the patient's satisfaction.     Cedarius Kersh D Zayvion Stailey

## 2022-11-17 NOTE — Transfer of Care (Signed)
Immediate Anesthesia Transfer of Care Note  Patient: Mary Stark  Procedure(s) Performed: CYSTOSCOPY WITH RETROGRADE PYELOGRAM/URETERAL STENT REPLACEMENT (Bilateral: Ureter)  Patient Location: PACU  Anesthesia Type:General  Level of Consciousness: awake, alert , and oriented  Airway & Oxygen Therapy: Patient Spontanous Breathing  Post-op Assessment: Report given to RN and Post -op Vital signs reviewed and stable  Post vital signs: Reviewed and stable  Last Vitals:  Vitals Value Taken Time  BP 123/63 11/17/22 1130  Temp    Pulse 66 11/17/22 1132  Resp 10 11/17/22 1132  SpO2 100 % 11/17/22 1132  Vitals shown include unvalidated device data.  Last Pain:  Vitals:   11/17/22 0825  TempSrc: Oral      Patients Stated Pain Goal: 3 (01/00/71 2197)  Complications: No notable events documented.

## 2022-11-17 NOTE — Op Note (Signed)
Preoperative diagnosis:  Bilateral hydronephrosis   Postoperative diagnosis:  Bilateral hydronephrosis    Procedure:  Cystoscopy bilateral ureteral stent exchange - 6 French by 24 cm no tether bilateral retrograde pyelography with interpretation   Surgeon: Jacalyn Lefevre, MD  Anesthesia: General  Complications: None  Intraoperative findings:  1.  Normal urethra 2.  Bilateral orthotopic ureteral orifices with stents emanating from them 3.  Bilateral retrograde pyelography demonstrated stable hydronephrosis  EBL: Minimal  Specimens: None  Indication: Mary Stark is a 66 y.o. patient with with a history of metastatic adenocarcinoma resulting in bilateral hydronephrosis managed with indwelling bilateral ureteral stents here for stent exchange. After reviewing the management options for treatment, he elected to proceed with the above surgical procedure(s). We have discussed the potential benefits and risks of the procedure, side effects of the proposed treatment, the likelihood of the patient achieving the goals of the procedure, and any potential problems that might occur during the procedure or recuperation. Informed consent has been obtained.  Description of procedure:  The patient was taken to the operating room and general anesthesia was induced.  The patient was placed in the dorsal lithotomy position, prepped and draped in the usual sterile fashion, and preoperative antibiotics were administered. A preoperative time-out was performed.   Cystourethroscopy was performed.  The patient's urethra was examined and was normal. The bladder was then systematically examined in its entirety. There was no evidence for any bladder tumors, stones, or other mucosal pathology.    Attention then turned to the left ureteral orifice and the existing ureteral stent was grasped with a grasper and brought to the urethral meatus.  A 0.38 sensor wire was then advanced through the ureteral stent to  the kidney and fluoroscopic guidance.  The stent was examined and deemed to be intact.  It was then discarded.  An open-ended ureteral catheter was then placed over the wire into the proximal ureter and the wire was removed.  A retrograde pyelogram was obtained.  Unchanged hydronephrosis stable in appearance.  The wire was then placed back through the open-ended ureteral catheter and the open-ended ureteral catheter was discarded.  A 6 French by 24 cm double-J ureteral stent was positioned appropriately under fluoroscopic and cystoscopic guidance.  The wire was then removed with an adequate stent curl noted in the renal pelvis as well as in the bladder.  Attention then turned to the right side and the exact procedure was performed on the right-hand side.  Again no changes noted to the pyelonephritis on the right-hand side.  The bladder was then emptied and the procedure ended.  The patient appeared to tolerate the procedure well and without complications.  The patient was able to be awakened and transferred to the recovery unit in satisfactory condition.    Jacalyn Lefevre, M.D.

## 2022-11-17 NOTE — Anesthesia Postprocedure Evaluation (Signed)
Anesthesia Post Note  Patient: Mary Stark  Procedure(s) Performed: CYSTOSCOPY WITH RETROGRADE PYELOGRAM/URETERAL STENT REPLACEMENT (Bilateral: Ureter)     Patient location during evaluation: PACU Anesthesia Type: General Level of consciousness: awake and alert Pain management: pain level controlled Vital Signs Assessment: post-procedure vital signs reviewed and stable Respiratory status: spontaneous breathing, nonlabored ventilation, respiratory function stable and patient connected to nasal cannula oxygen Cardiovascular status: blood pressure returned to baseline and stable Postop Assessment: no apparent nausea or vomiting Anesthetic complications: no   No notable events documented.  Last Vitals:  Vitals:   11/17/22 1200 11/17/22 1230  BP: (!) 142/72 (!) 151/77  Pulse: 64 62  Resp: 20 16  Temp:  (!) 36.4 C  SpO2: 100% 100%    Last Pain:  Vitals:   11/17/22 1230  TempSrc:   PainSc: 0-No pain                 Effie Berkshire

## 2022-11-18 ENCOUNTER — Encounter (HOSPITAL_BASED_OUTPATIENT_CLINIC_OR_DEPARTMENT_OTHER): Payer: Self-pay | Admitting: Urology

## 2022-11-19 ENCOUNTER — Ambulatory Visit (HOSPITAL_COMMUNITY)
Admission: RE | Admit: 2022-11-19 | Discharge: 2022-11-19 | Disposition: A | Discharge status: Home / Self Care | Payer: Medicare HMO | Source: Ambulatory Visit | Attending: Oncology | Admitting: Oncology

## 2022-11-19 DIAGNOSIS — N1330 Unspecified hydronephrosis: Secondary | ICD-10-CM | POA: Diagnosis not present

## 2022-11-19 DIAGNOSIS — C799 Secondary malignant neoplasm of unspecified site: Secondary | ICD-10-CM | POA: Insufficient documentation

## 2022-11-19 DIAGNOSIS — C679 Malignant neoplasm of bladder, unspecified: Secondary | ICD-10-CM | POA: Insufficient documentation

## 2022-11-19 DIAGNOSIS — K5730 Diverticulosis of large intestine without perforation or abscess without bleeding: Secondary | ICD-10-CM | POA: Diagnosis not present

## 2022-11-19 DIAGNOSIS — M25562 Pain in left knee: Secondary | ICD-10-CM | POA: Diagnosis not present

## 2022-11-19 DIAGNOSIS — I700 Atherosclerosis of aorta: Secondary | ICD-10-CM | POA: Diagnosis not present

## 2022-11-19 DIAGNOSIS — J432 Centrilobular emphysema: Secondary | ICD-10-CM | POA: Diagnosis not present

## 2022-11-19 DIAGNOSIS — J8410 Pulmonary fibrosis, unspecified: Secondary | ICD-10-CM | POA: Diagnosis not present

## 2022-11-19 DIAGNOSIS — K6389 Other specified diseases of intestine: Secondary | ICD-10-CM | POA: Diagnosis not present

## 2022-11-19 MED ORDER — IOHEXOL 300 MG/ML  SOLN
100.0000 mL | Freq: Once | INTRAMUSCULAR | Status: AC | PRN
  Administered 2022-11-19: 100 mL via INTRAVENOUS

## 2022-11-20 IMAGING — CT CT HEAD W/O CM
3 series · 15 of 47 positions shown, 18 images · non-contrast
Comparison: Head CT 10/22/2020

CLINICAL DATA: Neuro deficit, acute, stroke suspected.



[Series 3: head wo · axial · 0.51mm/px · z∈[+1217,+1392]mm · 9 of 41 slices shown, 12 images]
[im 3/41  brain]
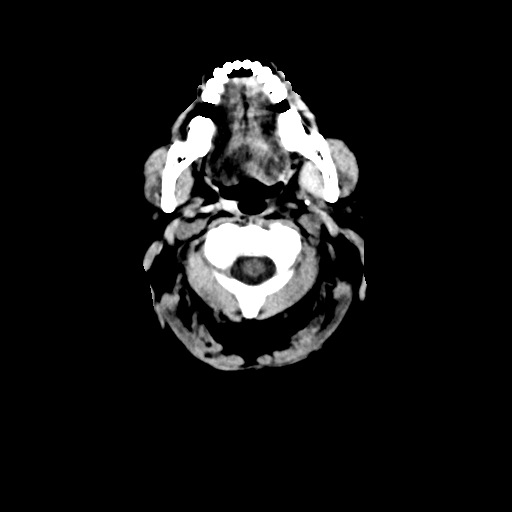
[im 3/41  bone]
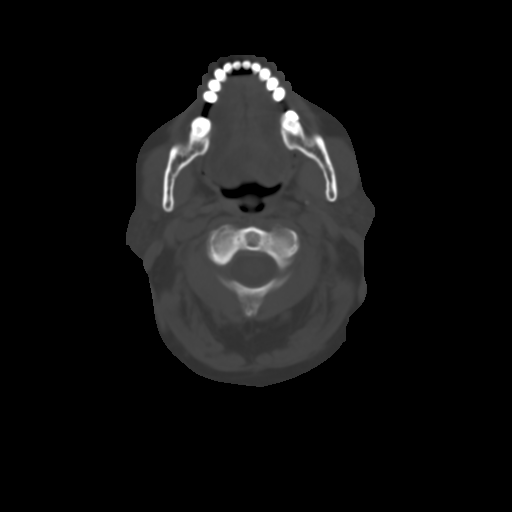
[im 7/41  brain]
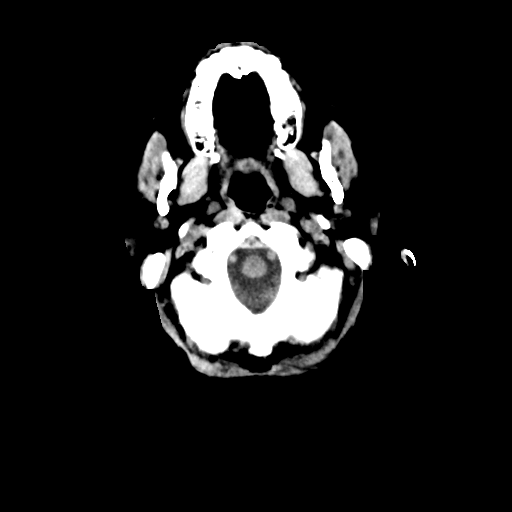
[im 12/41  brain]
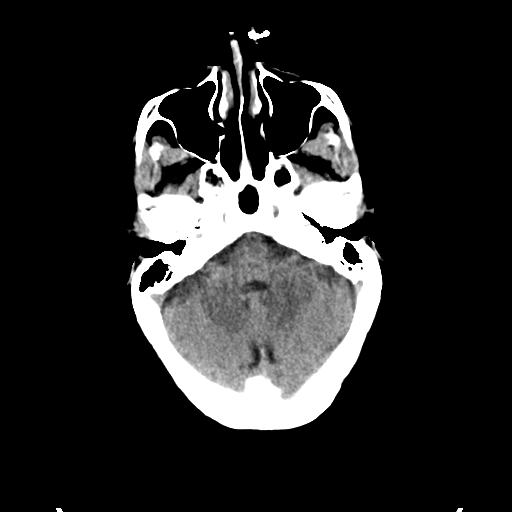
[im 16/41  brain]
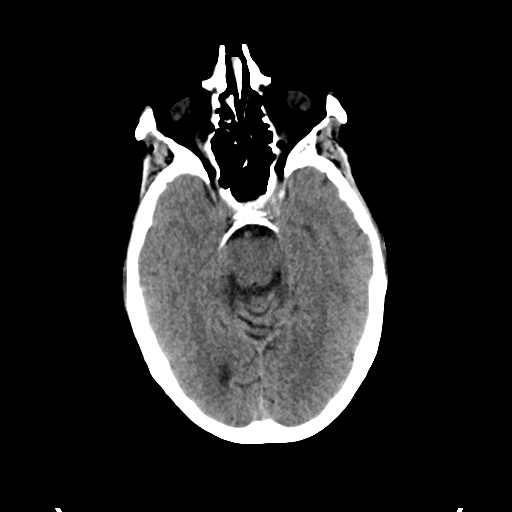
[im 21/41  brain]
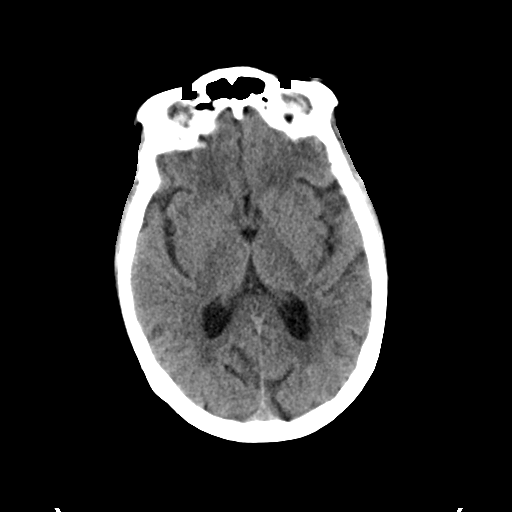
[im 21/41  bone]
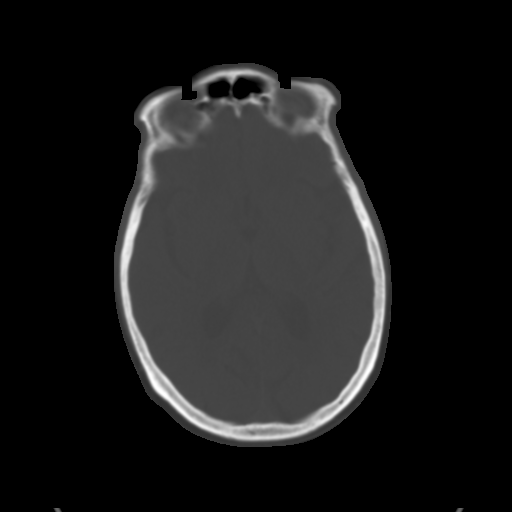
[im 25/41  brain]
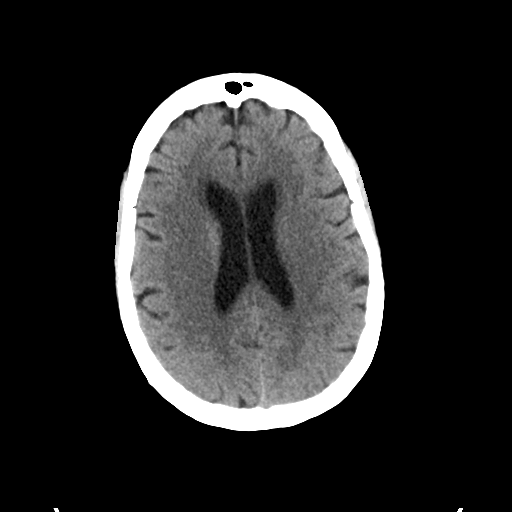
[im 29/41  brain]
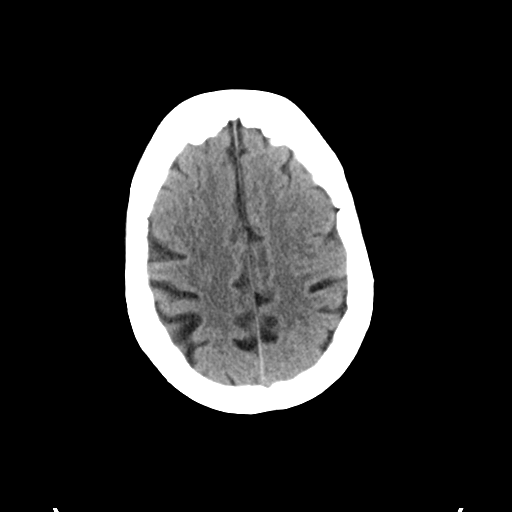
[im 34/41  brain]
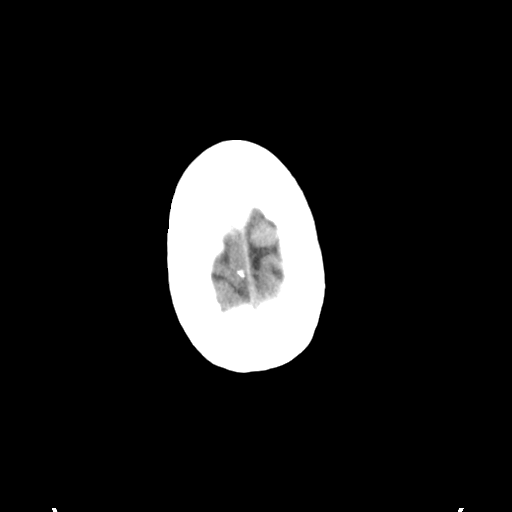
[im 38/41  brain]
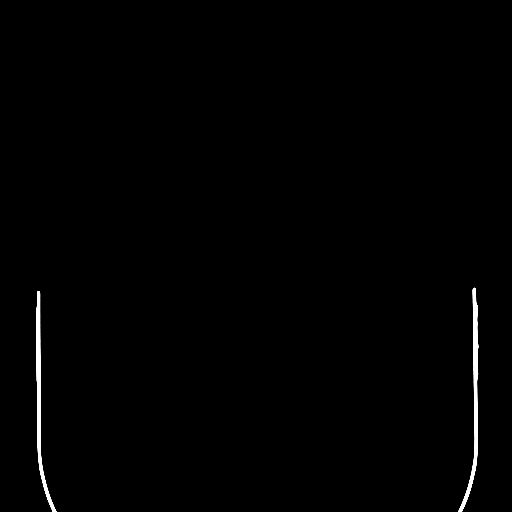
[im 38/41  bone]
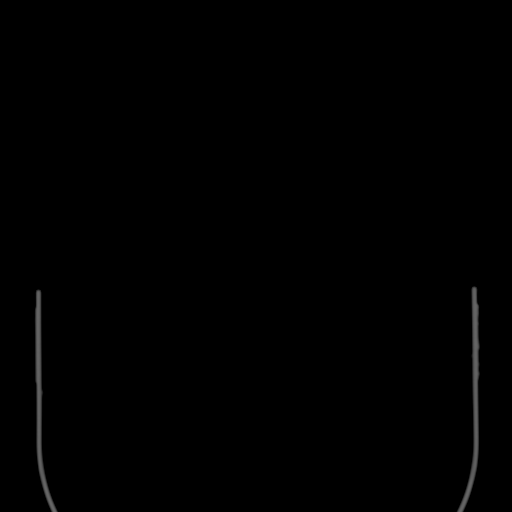

[Series 5: coronal soft tissue · coronal · 0.31mm/px · 3 of 73 slices shown]
[im 25/73  brain]
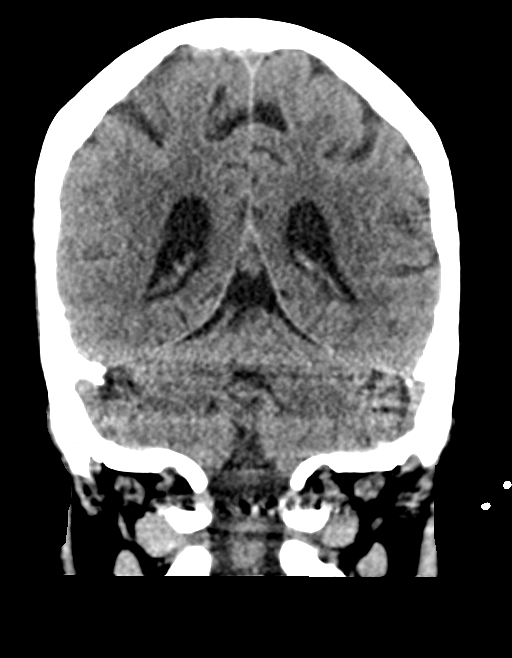
[im 33/73  brain]
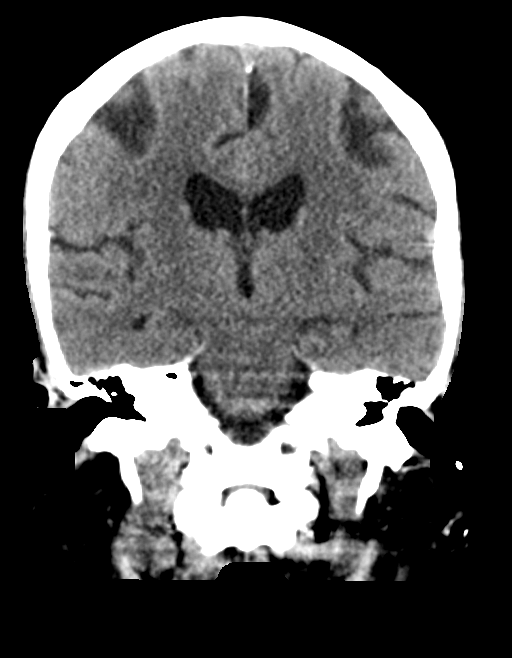
[im 41/73  brain]
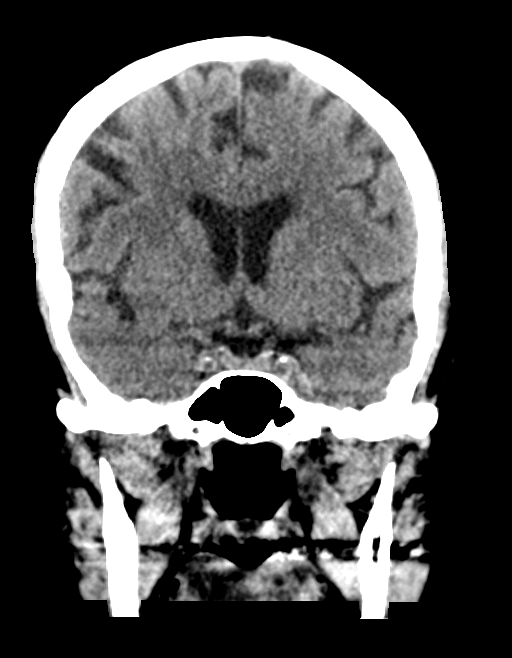

[Series 6: sagittal soft tissue · sagittal · 0.44mm/px · 3 of 53 slices shown]
[im 18/53  brain]
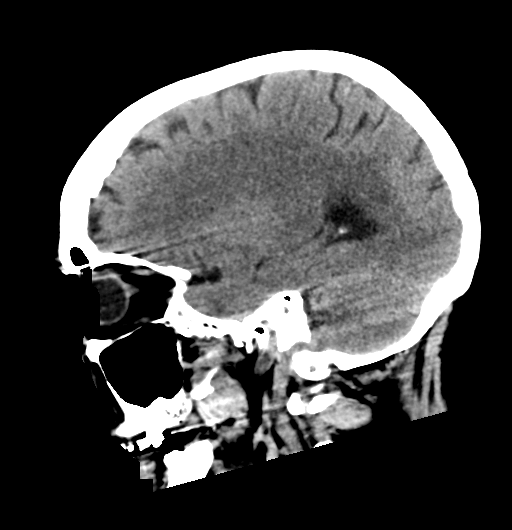
[im 27/53  brain]
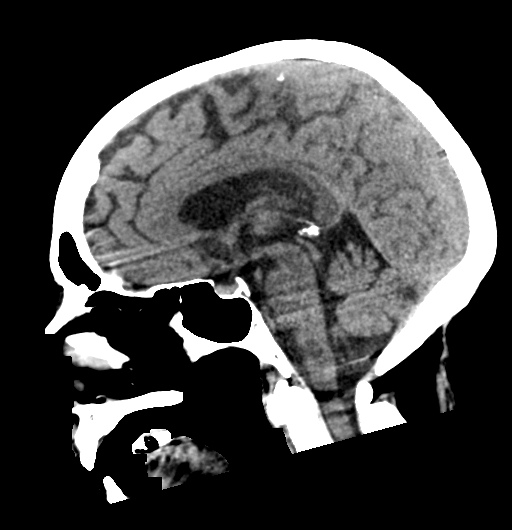
[im 35/53  brain]
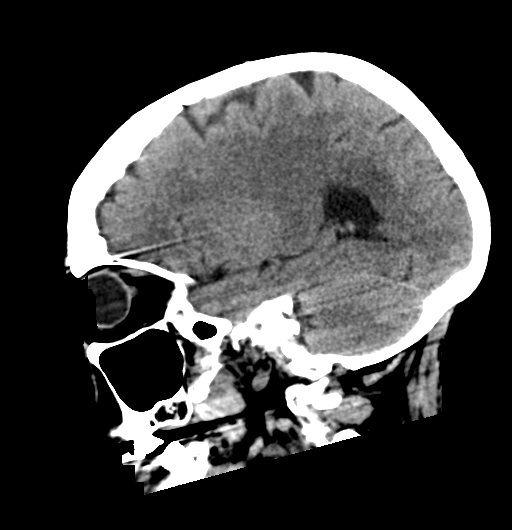

[15 of 47 positions shown; findings below may reference images not displayed]

FINDINGS: Brain: There is no evidence of an acute infarct, intracranial
hemorrhage, mass, midline shift, or extra-axial fluid collection.
Hypodensities in the cerebral white matter bilaterally are unchanged
and nonspecific but compatible with mild chronic small vessel
ischemic disease. The ventricles and sulci are within normal limits
for age.

Vascular: No hyperdense vessel.

Skull: No fracture or suspicious osseous lesion.

Sinuses/Orbits: Paranasal sinuses and mastoid air cells are clear.
Unremarkable orbits.

Other: None.
IMPRESSION: 1. No evidence of acute intracranial abnormality.
2. Mild chronic small vessel ischemic disease.

These results were communicated to Dr. Giorgi at [DATE] on
05/24/2022 by text page via the [employer] messaging system.

## 2022-11-20 IMAGING — CR DG CHEST 2V
2 series · 2 of 2 positions shown · non-contrast
Comparison: 08/13/2021

CLINICAL DATA: Productive cough and shortness of breath.

EXAM:
CHEST - 2 VIEW

[w chest pa]
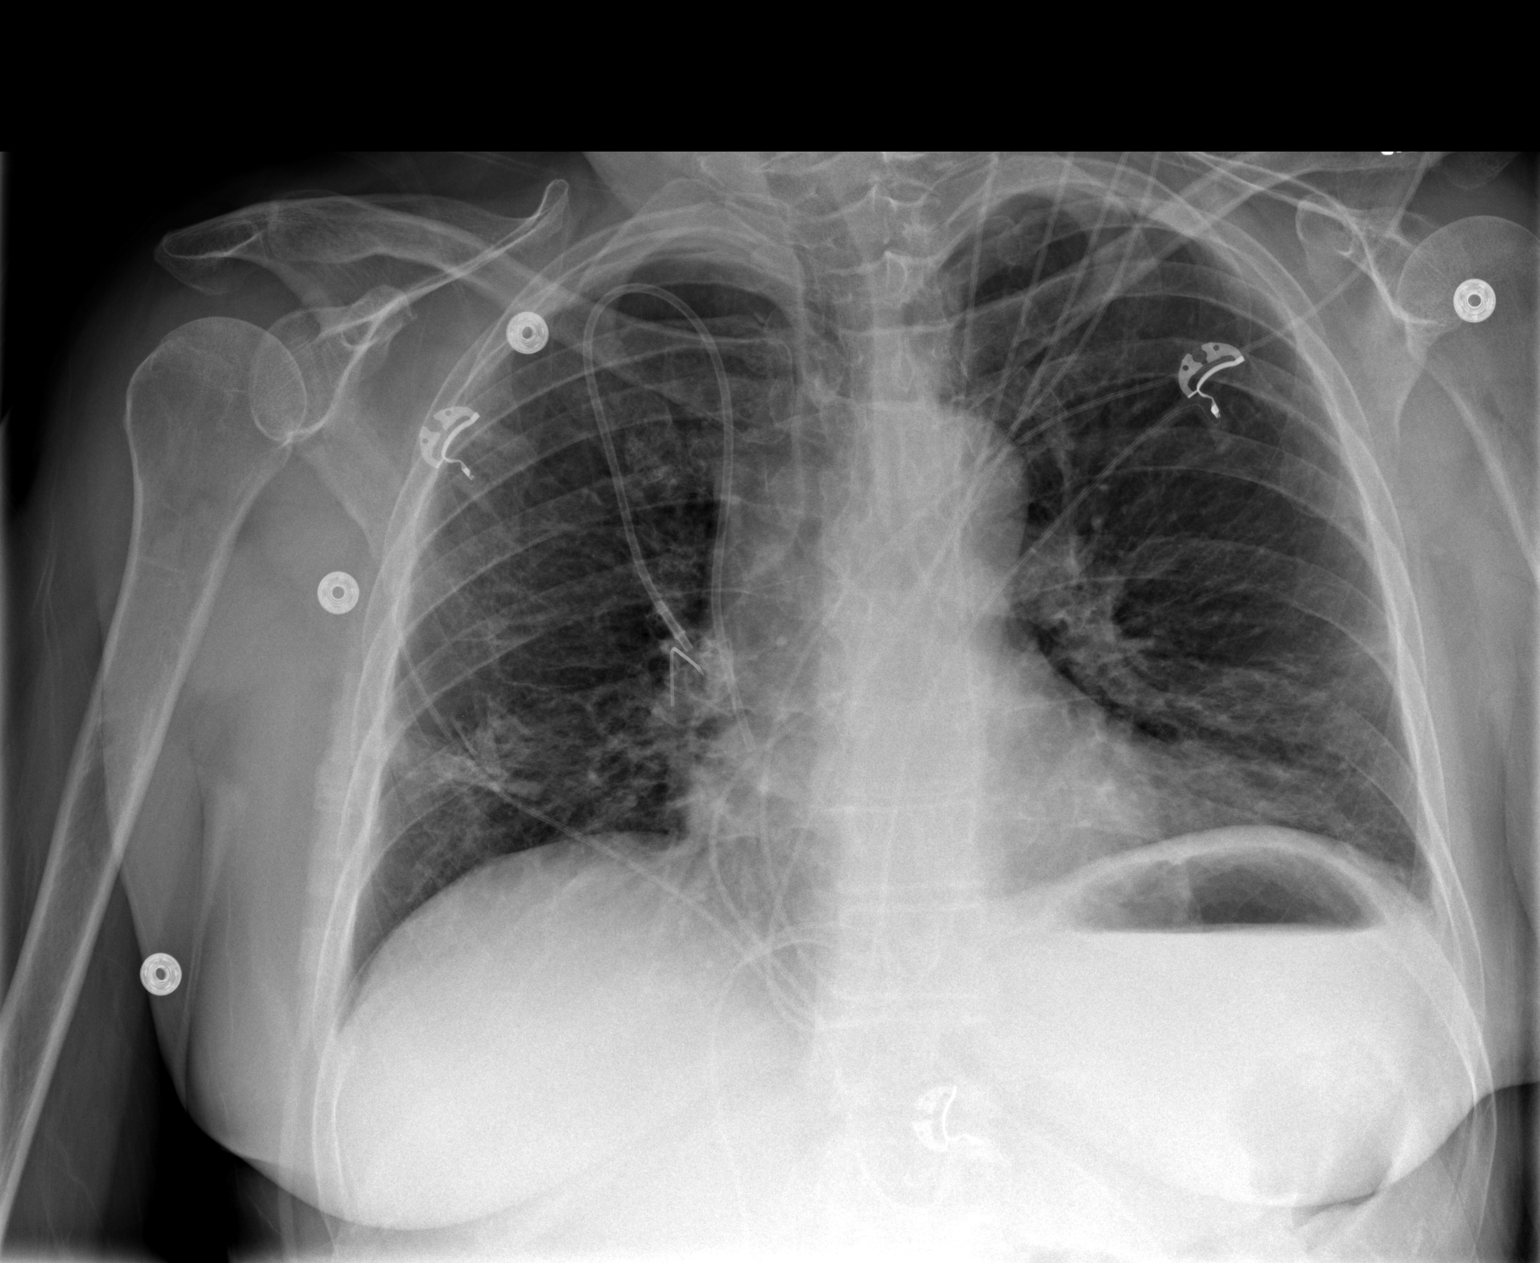

[w chest lat]
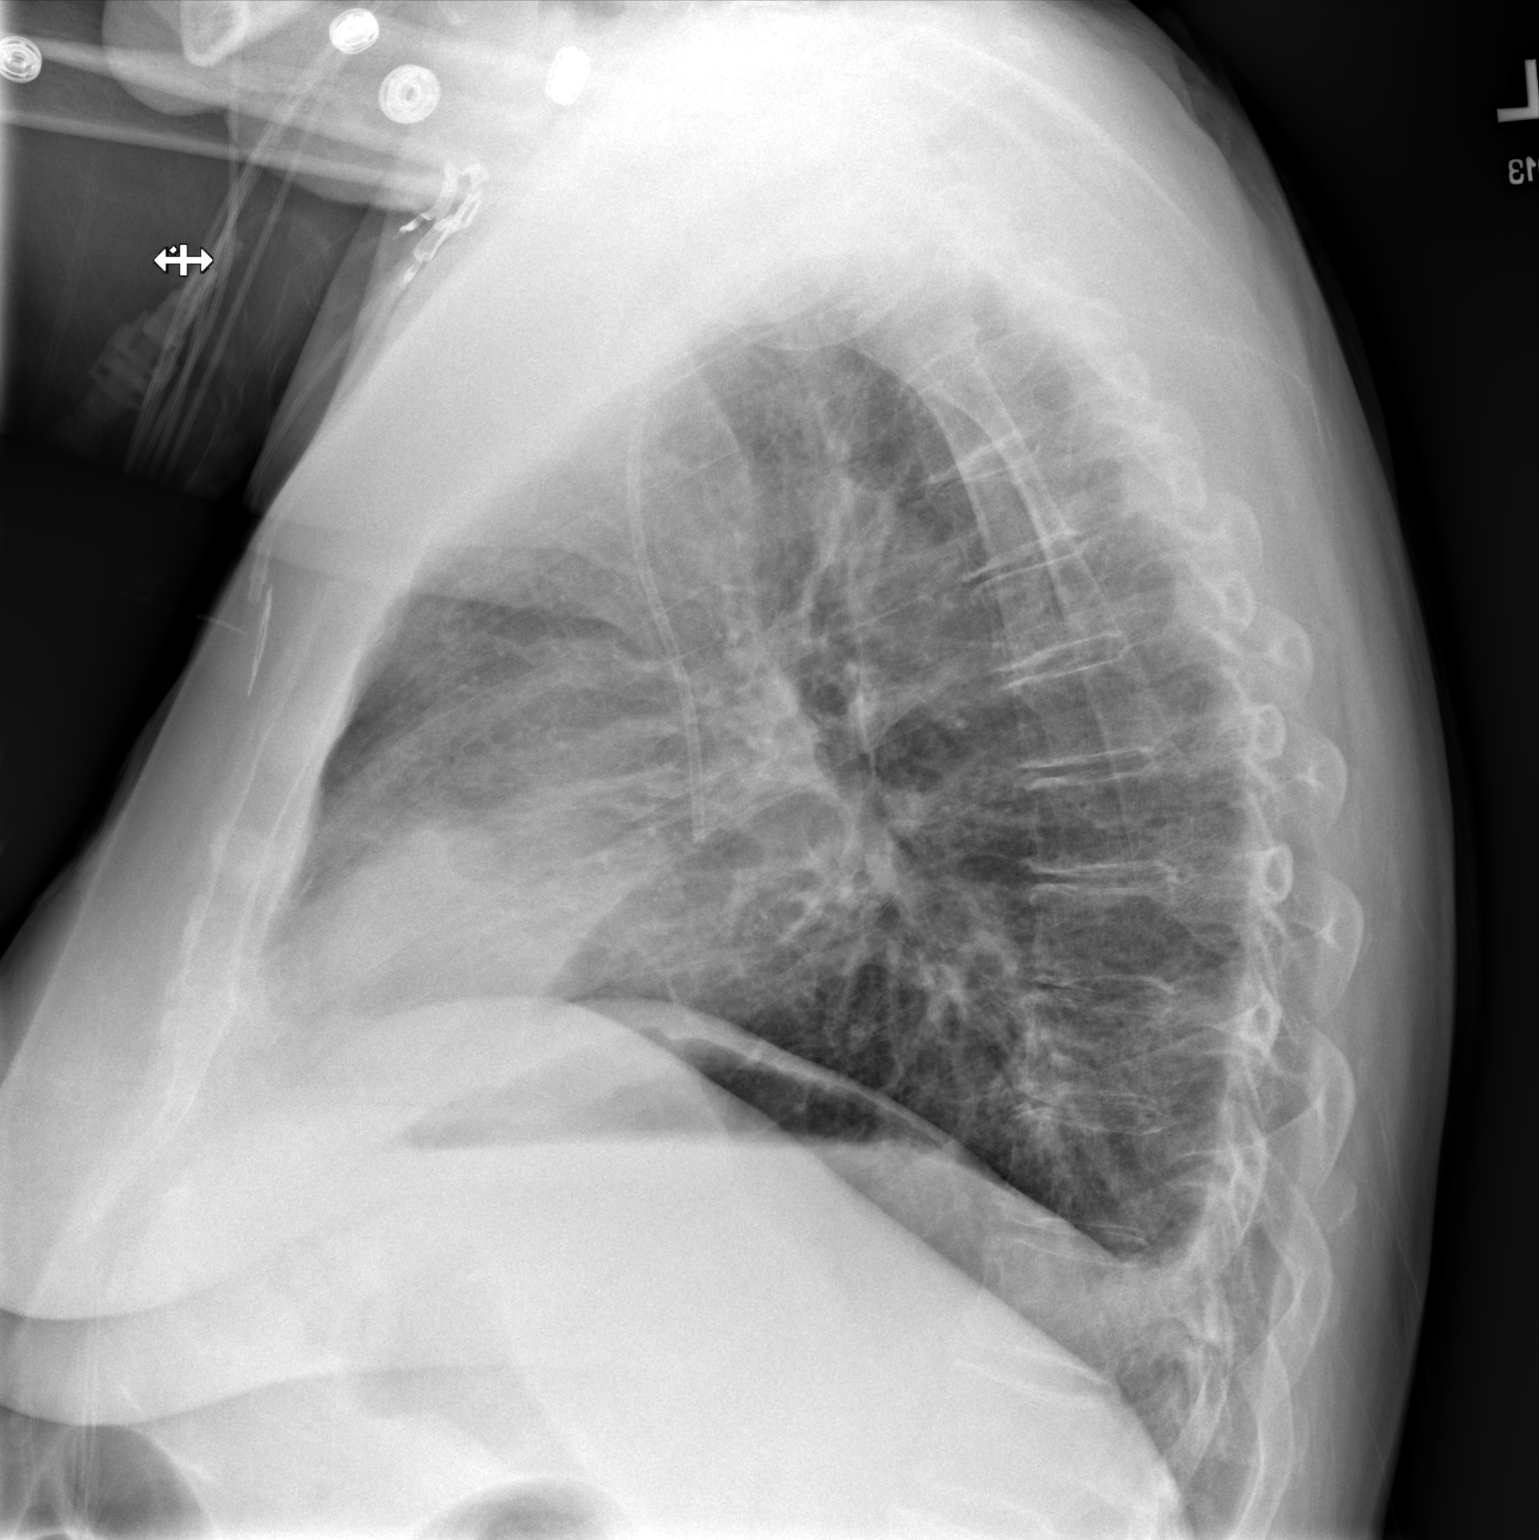

[2 of 2 positions shown; findings below may reference images not displayed]

FINDINGS: Low volume film. Basilar atelectasis noted with tiny left pleural
effusion. The cardiopericardial silhouette is within normal limits
for size. Right Port-A-Cath again noted. Bones are diffusely
demineralized.
IMPRESSION: Low volume film with basilar atelectasis and tiny left pleural
effusion.

## 2022-11-25 ENCOUNTER — Inpatient Hospital Stay: Payer: Medicare HMO | Admitting: Nurse Practitioner

## 2022-11-25 ENCOUNTER — Other Ambulatory Visit: Payer: Self-pay

## 2022-11-25 ENCOUNTER — Inpatient Hospital Stay: Payer: Medicare HMO | Attending: Oncology

## 2022-11-25 ENCOUNTER — Inpatient Hospital Stay (HOSPITAL_BASED_OUTPATIENT_CLINIC_OR_DEPARTMENT_OTHER): Payer: Medicare HMO | Admitting: Oncology

## 2022-11-25 ENCOUNTER — Inpatient Hospital Stay: Payer: Medicare HMO

## 2022-11-25 VITALS — BP 133/86 | HR 64 | Resp 16

## 2022-11-25 DIAGNOSIS — Z79899 Other long term (current) drug therapy: Secondary | ICD-10-CM | POA: Diagnosis not present

## 2022-11-25 DIAGNOSIS — C799 Secondary malignant neoplasm of unspecified site: Secondary | ICD-10-CM

## 2022-11-25 DIAGNOSIS — C7989 Secondary malignant neoplasm of other specified sites: Secondary | ICD-10-CM | POA: Insufficient documentation

## 2022-11-25 DIAGNOSIS — D6481 Anemia due to antineoplastic chemotherapy: Secondary | ICD-10-CM | POA: Insufficient documentation

## 2022-11-25 DIAGNOSIS — C679 Malignant neoplasm of bladder, unspecified: Secondary | ICD-10-CM | POA: Insufficient documentation

## 2022-11-25 DIAGNOSIS — C801 Malignant (primary) neoplasm, unspecified: Secondary | ICD-10-CM

## 2022-11-25 DIAGNOSIS — Z923 Personal history of irradiation: Secondary | ICD-10-CM | POA: Diagnosis not present

## 2022-11-25 DIAGNOSIS — Z95828 Presence of other vascular implants and grafts: Secondary | ICD-10-CM

## 2022-11-25 DIAGNOSIS — Z9049 Acquired absence of other specified parts of digestive tract: Secondary | ICD-10-CM | POA: Diagnosis not present

## 2022-11-25 DIAGNOSIS — C669 Malignant neoplasm of unspecified ureter: Secondary | ICD-10-CM

## 2022-11-25 DIAGNOSIS — Z5112 Encounter for antineoplastic immunotherapy: Secondary | ICD-10-CM | POA: Diagnosis not present

## 2022-11-25 LAB — CMP (CANCER CENTER ONLY)
ALT: 13 U/L (ref 0–44)
AST: 18 U/L (ref 15–41)
Albumin: 4.2 g/dL (ref 3.5–5.0)
Alkaline Phosphatase: 71 U/L (ref 38–126)
Anion gap: 5 (ref 5–15)
BUN: 16 mg/dL (ref 8–23)
CO2: 26 mmol/L (ref 22–32)
Calcium: 9.6 mg/dL (ref 8.9–10.3)
Chloride: 99 mmol/L (ref 98–111)
Creatinine: 1.04 mg/dL — ABNORMAL HIGH (ref 0.44–1.00)
GFR, Estimated: 60 mL/min — ABNORMAL LOW (ref >60)
Glucose, Bld: 82 mg/dL (ref 70–99)
Potassium: 4.6 mmol/L (ref 3.5–5.1)
Sodium: 130 mmol/L — ABNORMAL LOW (ref 135–145)
Total Bilirubin: 0.4 mg/dL (ref 0.3–1.2)
Total Protein: 7.2 g/dL (ref 6.5–8.1)

## 2022-11-25 LAB — CBC WITH DIFFERENTIAL (CANCER CENTER ONLY)
Abs Immature Granulocytes: 0.05 10*3/uL (ref 0.00–0.07)
Basophils Absolute: 0.1 10*3/uL (ref 0.0–0.1)
Basophils Relative: 1 %
Eosinophils Absolute: 0.7 10*3/uL — ABNORMAL HIGH (ref 0.0–0.5)
Eosinophils Relative: 11 %
HCT: 33.3 % — ABNORMAL LOW (ref 36.0–46.0)
Hemoglobin: 11.3 g/dL — ABNORMAL LOW (ref 12.0–15.0)
Immature Granulocytes: 1 %
Lymphocytes Relative: 27 %
Lymphs Abs: 1.8 10*3/uL (ref 0.7–4.0)
MCH: 32.2 pg (ref 26.0–34.0)
MCHC: 33.9 g/dL (ref 30.0–36.0)
MCV: 94.9 fL (ref 80.0–100.0)
Monocytes Absolute: 0.8 10*3/uL (ref 0.1–1.0)
Monocytes Relative: 11 %
Neutro Abs: 3.4 10*3/uL (ref 1.7–7.7)
Neutrophils Relative %: 49 %
Platelet Count: 195 10*3/uL (ref 150–400)
RBC: 3.51 MIL/uL — ABNORMAL LOW (ref 3.87–5.11)
RDW: 13.9 % (ref 11.5–15.5)
WBC Count: 6.8 10*3/uL (ref 4.0–10.5)
nRBC: 0 % (ref 0.0–0.2)

## 2022-11-25 LAB — TSH: TSH: 5.051 u[IU]/mL — ABNORMAL HIGH (ref 0.350–4.500)

## 2022-11-25 MED ORDER — SODIUM CHLORIDE 0.9 % IV SOLN
200.0000 mg | Freq: Once | INTRAVENOUS | Status: AC
  Administered 2022-11-25: 200 mg via INTRAVENOUS
  Filled 2022-11-25: qty 200

## 2022-11-25 MED ORDER — SODIUM CHLORIDE 0.9% FLUSH
10.0000 mL | INTRAVENOUS | Status: DC | PRN
  Administered 2022-11-25: 10 mL

## 2022-11-25 MED ORDER — HEPARIN SOD (PORK) LOCK FLUSH 100 UNIT/ML IV SOLN
500.0000 [IU] | Freq: Once | INTRAVENOUS | Status: AC | PRN
  Administered 2022-11-25: 500 [IU]

## 2022-11-25 MED ORDER — SODIUM CHLORIDE 0.9 % IV SOLN: Freq: Once | INTRAVENOUS | Status: AC

## 2022-11-25 MED ORDER — SODIUM CHLORIDE 0.9% FLUSH
10.0000 mL | Freq: Once | INTRAVENOUS | Status: AC
  Administered 2022-11-25: 10 mL

## 2022-11-25 NOTE — Patient Instructions (Signed)
[facility] MEDICAL [facility]COLOG[facility]  Discharge Instructions: Thank you for choosing [facility] to provide your oncology and hematology care.   If you have a lab appointment with the Cancer Center, please go directly to the Cancer Center and check in at the registration area.   Wear comfortable clothing and clothing appropriate for easy access to any Portacath or PICC line.   We strive to give you quality time with your provider. You may need to reschedule your appointment if you arrive late (15 or more minutes).  Arriving late affects you and other patients whose appointments are after yours.  Also, if you miss three or more appointments without notifying the office, you may be dismissed from the clinic at the provider's discretion.      For prescription refill requests, have your pharmacy contact our office and allow 72 hours for refills to be completed.    Today you received the following chemotherapy and/or immunotherapy agents: Keytruda.       To help prevent nausea and vomiting after your treatment, we encourage you to take your nausea medication as directed.  BELOW ARE SYMPTOMS THAT SHOULD BE REPORTED IMMEDIATELY: *FEVER GREATER THAN 100.4 F (38 C) OR HIGHER *CHILLS OR SWEATING *NAUSEA AND VOMITING THAT IS NOT CONTROLLED WITH YOUR NAUSEA MEDICATION *UNUSUAL SHORTNESS OF BREATH *UNUSUAL BRUISING OR BLEEDING *URINARY PROBLEMS (pain or burning when urinating, or frequent urination) *BOWEL PROBLEMS (unusual diarrhea, constipation, pain near the anus) TENDERNESS IN MOUTH AND THROAT WITH OR WITHOUT PRESENCE OF ULCERS (sore throat, sores in mouth, or a toothache) UNUSUAL RASH, SWELLING OR PAIN  UNUSUAL VAGINAL DISCHARGE OR ITCHING   Items with * indicate a potential emergency and should be followed up as soon as possible or go to the Emergency Department if any problems should occur.  Please show the CHEMOTHERAPY ALERT CARD or IMMUNOTHERAPY ALERT CARD at check-in to  the Emergency Department and triage nurse.  Should you have questions after your visit or need to cancel or reschedule your appointment, please contact [facility]  Dept: [phone]  and follow the prompts.  Office hours are 8:00 a.m. to 4:30 p.m. Monday - Friday. Please note that voicemails left after 4:00 p.m. may not be returned until the following business day.  We are closed weekends and major holidays. You have access to a nurse at all times for urgent questions. Please call the main number to the clinic Dept: [phone] and follow the prompts.   For any non-urgent questions, you may also contact your provider using MyChart. We now offer e-Visits for anyone 18 and older to request care online for non-urgent symptoms. For details visit [email or web address].   Also download the MyChart app! Go to the app store, search "MyChart", open the app, select [place], and log in with your MyChart username and password.  Masks are optional in the cancer centers. If you would like for your care team to wear a mask while they are taking care of you, please let them know. You may have one support person who is at least 65 years old accompany you for your appointments. 

## 2022-11-25 NOTE — Progress Notes (Signed)
Denies anyHematology and Oncology Follow Up Visit  Mary Stark [id number] 1956/10/19 66 y.o. 11/25/2022 11:20 AM [name], Scherrie Bateman, Mathis Dad, MD   [name]: 64 year old woman with bladder cancer diagnosed in 2021.  She presented with stage IV adenocarcinoma and pelvic adenopathy. PD-L1 CPS score of 35.  Additional molecular testing including [employer] testing in June 2023 did not show any actionable mutation.  Prior Therapy:  She is status post retroperitoneal lymph node biopsy completed on 10/24/2020 which showed metastatic carcinoma.  She is status post repeat cystoscopy and bilateral ureteral stent exchange as well as resection of a bladder tumor on January 21, 2021.  A final pathology showed an adenocarcinoma.  Chemotherapy utilizing carboplatin and gemcitabine started on November 22, 2020.  She completed 6 cycles of therapy on March 06, 2021.  FOLFOX chemotherapy started on Apr 15, 2021.  She completed 12 cycles of therapy.  She is status post radiation therapy to a right psoas metastatic lesion. She will complete 50 Gray in 5 fractions on February 27, 2022.  5-FU and leucovorin chemotherapy every 2 weeks started in November 2022.  She received maintenance therapy till June 2023 due to progression of disease.  She is status post right hemicolectomy completed on July 25, 2022.  The final pathology carcinoma that is a similar to her GU primary.  She received 1 cycle of Taxol chemotherapy on June 10, 2022.  Current therapy: Pembrolizumab 200 milligrams every 3 weeks started on July 01, 2022.  He is here for cycle 8 of therapy.  Interim History: Mary Stark returns today for a follow-up.  Since the last visit, she reports no major changes in her health.  She continues to be active and attends to activities of daily living.  She feels energetic and have recently joined a gym.  She has reported some knee pain occasionally but no other complications related to  Pembrolizumab.  Formal status quality of life remain excellent.   Medications: Updated on review. Current Outpatient Medications  Medication Sig Dispense Refill   albuterol (PROAIR HFA) 108 (90 Base) MCG/ACT inhaler INHALE 1 TO 2 PUFFS BY MOUTH INTO THE LUNGS EVERY 4 HOURS AS NEEDED FOR WHEEZING OR SHORTNESS OF BREATH 8 g 1   ARIPiprazole (ABILIFY) 10 MG tablet Take 1 tablet (10 mg total) by mouth daily. (Patient taking differently: Take 10 mg by mouth at bedtime.) 90 tablet 0   buPROPion (WELLBUTRIN XL) 300 MG 24 hr tablet TAKE 1 TABLET BY MOUTH EVERY DAY (Patient taking differently: Take 300 mg by mouth daily.) 90 tablet 1   cephALEXin (KEFLEX) 250 MG capsule Take 1 capsule (250 mg total) by mouth at bedtime. 90 capsule 3   clonazePAM (KLONOPIN) 1 MG tablet Take 1 tablet (1 mg total) by mouth at bedtime. 30 tablet 1   cycloSPORINE (RESTASIS) 0.05 % ophthalmic emulsion Place 1 drop both eyes twice a day (Patient taking differently: Place 1 drop into both eyes 2 (two) times daily.) 180 each 3   fexofenadine (ALLEGRA) 180 MG tablet Take 1 tablet (180 mg total) by mouth daily. 90 tablet 3   gabapentin (NEURONTIN) 300 MG capsule Take 1 capsule by mouth daily and 4 capsules at bedtime as directed. (Patient taking differently: Take 4 capsules by mouth at bedtime. Take 1 capsule by mouth daily and 4 capsules at bedtime as directed.) 450 capsule 1   lidocaine-prilocaine (EMLA) cream Apply 1 application topically as needed. (Patient taking differently: Apply 1 application  topically as needed (for port  access).) 30 g 0   Meth-Hyo-M Bl-Na Phos-Ph Sal (URIBEL) 118 MG CAPS Take one capsule by mouth 3 times daily (Patient taking differently: as needed (spasms).) 30 capsule 3   nebivolol (BYSTOLIC) 10 MG tablet Take 1 tablet (10 mg total) by mouth daily. (Patient taking differently: Take 10 mg by mouth daily.) 90 tablet 1   omeprazole (PRILOSEC) 20 MG capsule Take 1 capsule (20 mg total) by mouth daily. (Patient  taking differently: Take 20 mg by mouth daily.) 90 capsule 3   ondansetron (ZOFRAN) 4 MG tablet Take 1 tablet (4 mg total) by mouth every 8 (eight) hours as needed for nausea or vomiting. 40 tablet 3   oxybutynin (DITROPAN) 5 MG tablet Take 1 tablet (5 mg total) by mouth every 8 (eight) hours as needed. (Patient taking differently: Take 5 mg by mouth every 8 (eight) hours as needed for bladder spasms.) 90 tablet 3   phentermine 37.5 MG capsule Take 37.5 mg by mouth every morning.     sertraline (ZOLOFT) 25 MG tablet Take 1 tablet (25 mg total) by mouth daily. (Patient taking differently: Take 25 mg by mouth daily.) 90 tablet 1   simvastatin (ZOCOR) 20 MG tablet TAKE 1 TABLET (20 MG TOTAL) BY MOUTH DAILY. (Patient taking differently: Take 20 mg by mouth at bedtime.) 90 tablet 0   tamsulosin (FLOMAX) 0.4 MG CAPS capsule Take 1 capsule (0.4 mg total) by mouth at bedtime. 90 capsule 3   traMADol (ULTRAM) 50 MG tablet Take 1 tablet (50 mg total) by mouth every 12 (twelve) hours as needed for moderate pain. 180 tablet 0   No current facility-administered medications for this visit.   Facility-Administered Medications Ordered in Other Visits  Medication Dose Route Frequency Provider Last Rate Last Admin   sodium chloride flush (NS) 0.9 % injection 10 mL  10 mL Intracatheter Once Wyatt Portela, MD         Allergies:  Allergies  Allergen Reactions   Penicillins Hives and Rash    Reports hives to penicillin and amoxicillin as an adult "a long time ago." Has tolerated cefdinir (09/2020) and cefepime (10/2020) before.   Metformin Diarrhea    Extreme diarrhea and lactic acidosis      Physical Exam:   Blood pressure 133/84, pulse 68, temperature (!) 97.2 F (36.2 C), temperature source Temporal, resp. rate 16, height _0  (1.676 m), weight 146 lb 14.4 oz (66.6 kg), SpO2 97 %.     ECOG: 1   General appearance: Comfortable appearing without any discomfort Head: Normocephalic without any  trauma Oropharynx: Mucous membranes are moist and pink without any thrush or ulcers. Eyes: Pupils are equal and round reactive to light. Lymph nodes: No cervical, supraclavicular, inguinal or axillary lymphadenopathy.   Heart:regular rate and rhythm.  S1 and S2 without leg edema. Lung: Clear without any rhonchi or wheezes.  No dullness to percussion. Abdomin: Soft, nontender, nondistended with good bowel sounds.  No hepatosplenomegaly. Musculoskeletal: No joint deformity or effusion.  Full range of motion noted. Neurological: No deficits noted on motor, sensory and deep tendon reflex exam. Skin: No petechial rash or dryness.  Appeared moist.                           Lab Results: Lab Results  Component Value Date   WBC 7.3 11/04/2022   HGB 12.3 11/04/2022   HCT 36.1 11/04/2022   MCV 94.3 11/04/2022   PLT 186 11/04/2022  Chemistry      Component Value Date/Time   NA 131 (L) 11/04/2022 0954   NA 142 04/26/2018 1333   K 4.3 11/04/2022 0954   CL 99 11/04/2022 0954   CO2 23 11/04/2022 0954   BUN 20 11/04/2022 0954   BUN 22 04/26/2018 1333   CREATININE 1.21 (H) 11/04/2022 0954   CREATININE 1.32 (H) 06/04/2021 0000   GLU 87 02/07/2013 0000      Component Value Date/Time   CALCIUM 9.1 11/04/2022 0954   ALKPHOS 74 11/04/2022 0954   AST 33 11/04/2022 0954   ALT 27 11/04/2022 0954   BILITOT 0.7 11/04/2022 0954      Narrative & Impression  CLINICAL DATA:  Metastatic bladder cancer, status post stent placement, assess treatment response * Tracking Code: BO *   EXAM: CT CHEST, ABDOMEN, AND PELVIS WITH CONTRAST   TECHNIQUE: Multidetector CT imaging of the chest, abdomen and pelvis was performed following the standard protocol during bolus administration of intravenous contrast.   RADIATION DOSE REDUCTION: This exam was performed according to the departmental dose-optimization program which includes automated exposure control, adjustment of the mA  and/or kV according to patient size and/or use of iterative reconstruction technique.   CONTRAST:  161m OMNIPAQUE IOHEXOL 300 MG/ML  SOLN   COMPARISON:  08/26/2022   FINDINGS: CT CHEST FINDINGS   Cardiovascular: Right chest port catheter. Aortic atherosclerosis. Normal heart size. No pericardial effusion.   Mediastinum/Nodes: No enlarged mediastinal, hilar, or axillary lymph nodes. Thyroid gland, trachea, and esophagus demonstrate no significant findings.   Lungs/Pleura: Unchanged fibrotic scarring of the lungs with apical to basal gradient, featuring irregular peripheral interstitial opacity and arcing, bandlike elements with subpleural sparing (series 7, image 82). Mild centrilobular emphysema. Unchanged small nodule of the dependent right lower lobe measuring 0.4 cm, presumed benign and incidental (series 7, image 80). No pleural effusion or pneumothorax.   Musculoskeletal: No chest wall abnormality. No acute osseous findings.   CT ABDOMEN PELVIS FINDINGS   Hepatobiliary: No focal liver abnormality is seen. Status post cholecystectomy. No biliary dilatation.   Pancreas: Unremarkable. No pancreatic ductal dilatation or surrounding inflammatory changes.   Spleen: Normal in size without significant abnormality.   Adrenals/Urinary Tract: Adrenal glands are unremarkable. Bilateral double-J ureteral stent catheters remain in unchanged position with formed pigtails in the kidneys and bladder. Unchanged, severe left hydronephrosis with mild cortical atrophy of the left kidney. No right-sided hydronephrosis. Bladder is decompressed although unremarkable in appearance without obvious mass. Minimal air in the urinary bladder.   Stomach/Bowel: Stomach is within normal limits. Status post right hemicolectomy with ileocolic anastomosis. No evidence of bowel wall thickening, distention, or inflammatory changes. Sigmoid diverticula.   Vascular/Lymphatic: Aortic atherosclerosis.  Interval enlargement of multiple retroperitoneal and bilateral iliac lymph nodes, largest left retroperitoneal node measuring 1.5 x 1.3 cm, previously no greater than 0.5 cm (series 2, image 66), right iliac lymph node measuring 1.1 x 0.7 cm, previously 0.9 x 0.5 cm (series 2, image 98).   Reproductive: No mass or other abnormality.   Other: No abdominal wall hernia or abnormality. No ascites.   Musculoskeletal: No acute osseous findings.   IMPRESSION: 1. Interval enlargement of multiple retroperitoneal and bilateral iliac lymph nodes, consistent with nodal metastatic disease. 2. Bilateral double-J ureteral stent catheters remain in unchanged position with formed pigtails in the kidneys and bladder. Unchanged, severe left hydronephrosis with mild cortical atrophy of the left kidney. No right-sided hydronephrosis. 3. Bladder is decompressed although unremarkable in appearance without obvious  mass or other abnormality. Small volume of air within the bladder lumen presumably secondary to catheterization. 4. Unchanged small nodule of the dependent right lower lobe measuring 0.4 cm, nonspecific and incidental. No new nodules. 5. Unchanged fibrotic scarring of the lungs with apical to basal gradient, featuring irregular peripheral interstitial opacity and arcing, bandlike elements with subpleural sparing. Findings are strongly suggestive of chronic sequelae of prior COVID airspace disease. 6. Emphysema.   Aortic Atherosclerosis (ICD10-I70.0) and Emphysema (ICD10-J43.9).     Impression and Plan:    66 year old woman with:   1.  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  Previous treatment history outlined above and currently on Pembrolizumab and has completed 3 cycles every 3 weeks.  CT scan obtained on November 19, 2022 showed progression of disease although no evidence of widespread metastasis but slight enlargement in her pelvic  lymphadenopathy.  Treatment options moving forward were discussed.  Switching systemic therapy is probably reasonable at this time although her treatment options are limited.  Reintroducing taxane based therapy, FOLFIRI or local radiation to the large pelvic lymph nodes could be considered.  I recommended establishing care and obtaining additional opinion regarding her treatment before proceeding with any additional intervention.  She has an appointment with Dr. [name] or Dr. Archie Balboa in the near future at [facility].  She lives in [place] and it would be reasonable to resume her care they are moving forward.  After discussion today, we opted to proceed with the last cycle of pembrolizumab and she will switch therapy after evaluation at [facility].   2.  IV access: Port-A-Cath will continue to be used at this time.   3.  Shortness of breath: No exacerbation noted at this time.   4.  Goals of care: Therapy remains palliative but her performance status remains excellent and aggressive measures are warranted despite her heavily pretreated status.  6.  Anemia: Related to malignancy and previous chemotherapy.  Her hemoglobin continues to be close to normal range.  7.  Colon obstruction: She is status post surgical intervention in August 2023 with right hemicolectomy.  8.  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  These has been replaced by urology.   9.  Autoimmune considerations: Has not experienced any autoimmune issues at this time.  No mitis, colitis and thyroid disease.  This will continue to be reiterated.   10.  Follow-up: No follow-up is scheduled at this time.  She will establish care and follow-up with [facility].   30  minutes were dedicated to this visit.  The time was spent on updating disease status, treatment choices and outlining future plan of care discussion. Zola Button,  MD 12/13/202311:20 AM

## 2022-12-02 DIAGNOSIS — C679 Malignant neoplasm of bladder, unspecified: Secondary | ICD-10-CM | POA: Diagnosis not present

## 2022-12-02 DIAGNOSIS — C678 Malignant neoplasm of overlapping sites of bladder: Secondary | ICD-10-CM | POA: Diagnosis not present

## 2022-12-08 ENCOUNTER — Other Ambulatory Visit: Payer: Self-pay

## 2022-12-08 MED ORDER — ARIPIPRAZOLE 10 MG PO TABS: 10.0000 mg | ORAL_TABLET | Freq: Every day | ORAL | 0 refills | Status: DC

## 2022-12-16 ENCOUNTER — Other Ambulatory Visit: Payer: Medicare HMO

## 2022-12-16 ENCOUNTER — Ambulatory Visit: Payer: Medicare HMO | Admitting: Oncology

## 2022-12-16 ENCOUNTER — Ambulatory Visit: Payer: Medicare HMO

## 2022-12-16 ENCOUNTER — Other Ambulatory Visit: Payer: Self-pay | Admitting: Medical-Surgical

## 2022-12-16 DIAGNOSIS — Z95828 Presence of other vascular implants and grafts: Secondary | ICD-10-CM | POA: Diagnosis not present

## 2022-12-16 DIAGNOSIS — Z79620 Long term (current) use of immunosuppressive biologic: Secondary | ICD-10-CM | POA: Diagnosis not present

## 2022-12-16 DIAGNOSIS — C679 Malignant neoplasm of bladder, unspecified: Secondary | ICD-10-CM | POA: Diagnosis not present

## 2022-12-16 DIAGNOSIS — C678 Malignant neoplasm of overlapping sites of bladder: Secondary | ICD-10-CM | POA: Diagnosis not present

## 2022-12-22 ENCOUNTER — Other Ambulatory Visit (HOSPITAL_COMMUNITY): Payer: Self-pay

## 2022-12-22 ENCOUNTER — Other Ambulatory Visit: Payer: Self-pay

## 2022-12-28 ENCOUNTER — Other Ambulatory Visit: Payer: Self-pay

## 2022-12-28 ENCOUNTER — Other Ambulatory Visit (HOSPITAL_COMMUNITY): Payer: Self-pay

## 2022-12-28 ENCOUNTER — Encounter: Payer: Self-pay | Admitting: Oncology

## 2022-12-29 ENCOUNTER — Other Ambulatory Visit: Payer: Self-pay

## 2022-12-31 DIAGNOSIS — H353131 Nonexudative age-related macular degeneration, bilateral, early dry stage: Secondary | ICD-10-CM | POA: Diagnosis not present

## 2022-12-31 DIAGNOSIS — H25813 Combined forms of age-related cataract, bilateral: Secondary | ICD-10-CM | POA: Diagnosis not present

## 2022-12-31 DIAGNOSIS — H524 Presbyopia: Secondary | ICD-10-CM | POA: Diagnosis not present

## 2022-12-31 DIAGNOSIS — H40013 Open angle with borderline findings, low risk, bilateral: Secondary | ICD-10-CM | POA: Diagnosis not present

## 2022-12-31 DIAGNOSIS — H04123 Dry eye syndrome of bilateral lacrimal glands: Secondary | ICD-10-CM | POA: Diagnosis not present

## 2023-01-06 ENCOUNTER — Telehealth: Payer: Self-pay | Admitting: Medical-Surgical

## 2023-01-06 DIAGNOSIS — Z95828 Presence of other vascular implants and grafts: Secondary | ICD-10-CM | POA: Diagnosis not present

## 2023-01-06 DIAGNOSIS — C670 Malignant neoplasm of trigone of bladder: Secondary | ICD-10-CM | POA: Diagnosis not present

## 2023-01-06 DIAGNOSIS — C678 Malignant neoplasm of overlapping sites of bladder: Secondary | ICD-10-CM | POA: Diagnosis not present

## 2023-01-06 DIAGNOSIS — Z79620 Long term (current) use of immunosuppressive biologic: Secondary | ICD-10-CM | POA: Diagnosis not present

## 2023-01-06 DIAGNOSIS — E871 Hypo-osmolality and hyponatremia: Secondary | ICD-10-CM | POA: Diagnosis not present

## 2023-01-06 NOTE — Telephone Encounter (Signed)
Patient oncologist is requesting lab work she wants to know if you can have that done so she can come in for lab work ASAP.

## 2023-01-06 NOTE — Telephone Encounter (Signed)
Mary Stark states she has already scheduled the lab work with oncologist.

## 2023-01-15 DIAGNOSIS — C670 Malignant neoplasm of trigone of bladder: Secondary | ICD-10-CM | POA: Diagnosis not present

## 2023-01-16 ENCOUNTER — Other Ambulatory Visit: Payer: Self-pay | Admitting: Medical-Surgical

## 2023-01-18 ENCOUNTER — Other Ambulatory Visit (HOSPITAL_COMMUNITY): Payer: Self-pay

## 2023-01-20 ENCOUNTER — Ambulatory Visit (INDEPENDENT_AMBULATORY_CARE_PROVIDER_SITE_OTHER): Payer: Medicare HMO | Admitting: Sports Medicine

## 2023-01-20 ENCOUNTER — Telehealth: Payer: Self-pay | Admitting: Sports Medicine

## 2023-01-20 ENCOUNTER — Telehealth: Payer: Self-pay

## 2023-01-20 DIAGNOSIS — M170 Bilateral primary osteoarthritis of knee: Secondary | ICD-10-CM | POA: Diagnosis not present

## 2023-01-20 MED ORDER — TRAMADOL HCL 50 MG PO TABS: 50.0000 mg | ORAL_TABLET | Freq: Three times a day (TID) | ORAL | 3 refills | Status: DC | PRN

## 2023-01-20 NOTE — Telephone Encounter (Signed)
PA information submitted via [email or web address] for Orthovisc Paperwork has been printed and given to Dr. T for signatures. Once obtained, information will be faxed to [employer] at [phone]  

## 2023-01-20 NOTE — Assessment & Plan Note (Signed)
Pleasant 67 year old female, known bilateral knee osteoarthritis, last injection was left-sided and in August 2023, unfortunately she did not get sufficient relief. At this point she has x-ray confirmed osteoarthritis and has failed greater than 6 weeks of conservative treatment, including steroid injections, oral analgesics. Increasing tramadol to 100 mg 3 times daily, we will also get her approved for bilateral Orthovisc.

## 2023-01-20 NOTE — Telephone Encounter (Signed)
Patient left VM and states she needs the tramadol to go to [place] instead if [facility] can you please sent it there.

## 2023-01-20 NOTE — Telephone Encounter (Signed)
Orthovisc approval please, bilateral, x-ray confirmed osteoarthritis, has failed analgesics, steroids, narcotics, injections.

## 2023-01-20 NOTE — Progress Notes (Signed)
    Procedures performed today:    None.  Independent interpretation of notes and tests performed by another provider:   None.  Brief History, Exam, Impression, and Recommendations:    Primary osteoarthritis of both knees Pleasant 67 year old female, known bilateral knee osteoarthritis, last injection was left-sided and in August 2023, unfortunately she did not get sufficient relief. At this point she has x-ray confirmed osteoarthritis and has failed greater than 6 weeks of conservative treatment, including steroid injections, oral analgesics. Increasing tramadol to 100 mg 3 times daily, we will also get her approved for bilateral Orthovisc.    ____________________________________________ Gwen Her. Dianah Field, M.D., ABFM., CAQSM., AME. Primary Care and Sports Medicine [facility]  Adjunct Professor of [employer] Medicine  Risk manager

## 2023-01-21 ENCOUNTER — Other Ambulatory Visit (HOSPITAL_COMMUNITY): Payer: Self-pay

## 2023-01-21 MED ORDER — TRAMADOL HCL 50 MG PO TABS
50.0000 mg | ORAL_TABLET | Freq: Three times a day (TID) | ORAL | 3 refills | Status: DC | PRN
  Filled 2023-01-21: qty 180, 30d supply, fill #0

## 2023-01-21 NOTE — Telephone Encounter (Signed)
Switching to [employer]

## 2023-01-21 NOTE — Addendum Note (Signed)
Addended by: Silverio Decamp on: 01/21/2023 12:52 PM   Modules accepted: Orders

## 2023-01-25 DIAGNOSIS — R590 Localized enlarged lymph nodes: Secondary | ICD-10-CM | POA: Diagnosis not present

## 2023-01-25 DIAGNOSIS — C670 Malignant neoplasm of trigone of bladder: Secondary | ICD-10-CM | POA: Diagnosis not present

## 2023-01-27 NOTE — Telephone Encounter (Signed)
Benefits Investigation Details received from [employer] Injection: Orthovisc PA required: No May fill through: [employer] [place] Copay/Coinsurance: 0% Product Copay: 0% Administration Coinsurance: 0% Administration Copay: 0% Out of Pocket Max: $3600 (met: $3600)

## 2023-02-02 ENCOUNTER — Other Ambulatory Visit: Payer: Self-pay | Admitting: Medical-Surgical

## 2023-02-02 NOTE — Telephone Encounter (Signed)
Patient called and notified of price and sent her to the front for scheduling.

## 2023-02-03 DIAGNOSIS — C679 Malignant neoplasm of bladder, unspecified: Secondary | ICD-10-CM | POA: Diagnosis not present

## 2023-02-03 DIAGNOSIS — R946 Abnormal results of thyroid function studies: Secondary | ICD-10-CM | POA: Diagnosis not present

## 2023-02-03 DIAGNOSIS — C670 Malignant neoplasm of trigone of bladder: Secondary | ICD-10-CM | POA: Diagnosis not present

## 2023-02-03 DIAGNOSIS — I959 Hypotension, unspecified: Secondary | ICD-10-CM | POA: Diagnosis not present

## 2023-02-05 DIAGNOSIS — Z95828 Presence of other vascular implants and grafts: Secondary | ICD-10-CM | POA: Diagnosis not present

## 2023-02-05 DIAGNOSIS — C678 Malignant neoplasm of overlapping sites of bladder: Secondary | ICD-10-CM | POA: Diagnosis not present

## 2023-02-05 DIAGNOSIS — Z5112 Encounter for antineoplastic immunotherapy: Secondary | ICD-10-CM | POA: Diagnosis not present

## 2023-02-16 ENCOUNTER — Ambulatory Visit (INDEPENDENT_AMBULATORY_CARE_PROVIDER_SITE_OTHER): Payer: Medicare HMO | Admitting: Medical-Surgical

## 2023-02-16 ENCOUNTER — Other Ambulatory Visit (HOSPITAL_COMMUNITY): Payer: Self-pay

## 2023-02-16 ENCOUNTER — Encounter: Payer: Self-pay | Admitting: Medical-Surgical

## 2023-02-16 ENCOUNTER — Other Ambulatory Visit: Payer: Self-pay

## 2023-02-16 VITALS — BP 94/62 | HR 78 | Resp 20 | Ht 66.0 in | Wt 137.2 lb

## 2023-02-16 DIAGNOSIS — I951 Orthostatic hypotension: Secondary | ICD-10-CM | POA: Diagnosis not present

## 2023-02-16 DIAGNOSIS — R062 Wheezing: Secondary | ICD-10-CM | POA: Diagnosis not present

## 2023-02-16 DIAGNOSIS — M79671 Pain in right foot: Secondary | ICD-10-CM | POA: Diagnosis not present

## 2023-02-16 DIAGNOSIS — G4701 Insomnia due to medical condition: Secondary | ICD-10-CM

## 2023-02-16 DIAGNOSIS — Z1231 Encounter for screening mammogram for malignant neoplasm of breast: Secondary | ICD-10-CM

## 2023-02-16 MED ORDER — ALBUTEROL SULFATE HFA 108 (90 BASE) MCG/ACT IN AERS
INHALATION_SPRAY | RESPIRATORY_TRACT | 1 refills | Status: DC
  Filled 2023-02-16: qty 18, 17d supply, fill #0
  Filled 2023-02-26: qty 18, 16d supply, fill #0

## 2023-02-16 MED ORDER — MIDODRINE HCL 2.5 MG PO TABS
2.5000 mg | ORAL_TABLET | Freq: Two times a day (BID) | ORAL | 0 refills | Status: DC
  Filled 2023-02-16: qty 60, 30d supply, fill #0

## 2023-02-16 NOTE — Progress Notes (Signed)
Established Patient Office Visit  Subjective   Patient ID: Mary Stark, female   DOB: [birth year] Age: 67 y.o. MRN: [id number]   Chief Complaint  Patient presents with   Follow-up   HPI Pleasant 67 year old female presenting today for the following:   Wheezing: Requesting refill on her albuterol inhaler.  Since having multiple infections last year, she continues to have issues with intermittent wheezing.  Notes the albuterol helps to address this.  Has had right lateral foot pain that affects the fourth and fifth toe.  Sometimes it is so sensitive she is unable to tolerate the sheet touching it at night.  This can wake her from sleep despite the medication she takes.  She would like to be checked for gout.  Insomnia: Taking Klonopin 1 mg and Benadryl at night which helps sleep with the exception of waking as noted above.  Objective:    Vitals:   02/16/23 1014  BP: 94/62  Pulse: 78  Resp: 20  Height: '5\' 6"'$  (1.676 m)  Weight: 137 lb 3.2 oz (62.2 kg)  SpO2: 97%  BMI (Calculated): 22.16   Physical Exam Vitals reviewed.  Constitutional:      General: She is not in acute distress.    Appearance: Normal appearance. She is normal weight. She is not ill-appearing.  HENT:     Head: Normocephalic and atraumatic.  Cardiovascular:     Rate and Rhythm: Normal rate and regular rhythm.     Pulses: Normal pulses.     Heart sounds: Normal heart sounds.  Pulmonary:     Effort: Pulmonary effort is normal. No respiratory distress.     Breath sounds: Normal breath sounds. No wheezing, rhonchi or rales.  Skin:    General: Skin is warm and dry.  Neurological:     Mental Status: She is alert and oriented to person, place, and time.  Psychiatric:        Mood and Affect: Mood normal.        Behavior: Behavior normal.        Thought Content: Thought content normal.        Judgment: Judgment normal.   No results found. However, due to the size of the patient record, not all encounters  were searched. Please check Results Review for a complete set of results.     The ASCVD Risk score (Arnett DK, et al., 2019) failed to calculate for the following reasons:   Cannot find a previous HDL lab   Cannot find a previous total cholesterol lab   Assessment & Plan:   1. Orthostatic hypotension Unfortunately she is quite symptomatic with her hypotension and it is inhibiting her ability to function normally.  She has an upcoming visit with nephrology to evaluate for renal etiology.  In the meantime, would recommend midodrine 2.5 mg twice daily as needed with meals.  Also discussed the addition of abdominal compression using an abdominal binder or undergarment as this can help increase blood pressure to the brain. - midodrine (PROAMATINE) 2.5 MG tablet; Take 1 tablet (2.5 mg total) by mouth 2 (two) times daily with a meal.  Dispense: 60 tablet; Refill: 0  2. Right foot pain Order placed to check uric acid.  If preferred, she is welcome to take this to have it drawn with her next labs at the cancer center. - Uric acid  3. Wheezing Refilling albuterol as requested. 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  5. Encounter for screening mammogram for malignant neoplasm of breast Mammogram ordered. - MM DIGITAL SCREENING BILATERAL; Future  Return in about 6 months (around 08/19/2023) for Welcome to Medicare/CPE.  ___________________________________________ [name], DNP, APRN, FNP-BC Primary Care and [place]

## 2023-02-17 ENCOUNTER — Other Ambulatory Visit: Payer: Self-pay

## 2023-02-18 ENCOUNTER — Other Ambulatory Visit (HOSPITAL_COMMUNITY): Payer: Self-pay

## 2023-02-18 ENCOUNTER — Ambulatory Visit (INDEPENDENT_AMBULATORY_CARE_PROVIDER_SITE_OTHER): Payer: Medicare HMO | Admitting: Sports Medicine

## 2023-02-18 ENCOUNTER — Ambulatory Visit (INDEPENDENT_AMBULATORY_CARE_PROVIDER_SITE_OTHER): Payer: Medicare HMO

## 2023-02-18 ENCOUNTER — Telehealth: Payer: Self-pay

## 2023-02-18 ENCOUNTER — Encounter (HOSPITAL_COMMUNITY): Payer: Self-pay

## 2023-02-18 DIAGNOSIS — M170 Bilateral primary osteoarthritis of knee: Secondary | ICD-10-CM

## 2023-02-18 DIAGNOSIS — E785 Hyperlipidemia, unspecified: Secondary | ICD-10-CM

## 2023-02-18 MED ORDER — HYALURONAN 30 MG/2ML IX SOSY
30.0000 mg | PREFILLED_SYRINGE | Freq: Once | INTRA_ARTICULAR | Status: AC
  Administered 2023-02-18: 30 mg via INTRA_ARTICULAR

## 2023-02-18 NOTE — Assessment & Plan Note (Signed)
Orthovisc 1 of 4 both knees, return in 1 week for #2 of 4

## 2023-02-18 NOTE — Telephone Encounter (Signed)
Patient informed and order placed at front desk for pick up .

## 2023-02-18 NOTE — Progress Notes (Signed)
    Procedures performed today:    Procedure: Real-time Ultrasound Guided injection of the left knee Device: Samsung HS60  Verbal informed consent obtained.  Time-out conducted.  Noted no overlying erythema, induration, or other signs of local infection.  Skin prepped in a sterile fashion.  Local anesthesia: Topical Ethyl chloride.  With sterile technique and under real time ultrasound guidance: 30 mg/2 mL of OrthoVisc (sodium hyaluronate) in a prefilled syringe was injected easily into the knee through a 22-gauge needle. Completed without difficulty  Advised to call if fevers/chills, erythema, induration, drainage, or persistent bleeding.  Images permanently stored and available for review in PACS.  Impression: Technically successful ultrasound guided injection.  Procedure: Real-time Ultrasound Guided injection of the right knee Device: Samsung HS60  Verbal informed consent obtained.  Time-out conducted.  Noted no overlying erythema, induration, or other signs of local infection.  Skin prepped in a sterile fashion.  Local anesthesia: Topical Ethyl chloride.  With sterile technique and under real time ultrasound guidance: 30 mg/2 mL of OrthoVisc (sodium hyaluronate) in a prefilled syringe was injected easily into the knee through a 22-gauge needle.  Completed without difficulty  Advised to call if fevers/chills, erythema, induration, drainage, or persistent bleeding.  Images permanently stored and available for review in PACS.  Impression: Technically successful ultrasound guided injection.  Independent interpretation of notes and tests performed by another provider:   None.  Brief History, Exam, Impression, and Recommendations:    Primary osteoarthritis of both knees Orthovisc 1 of 4 both knees, return in 1 week for #2 of 4    ____________________________________________ Gwen Her. Dianah Field, M.D., ABFM., CAQSM., AME. Primary Care and Sports Medicine   [facility]  Adjunct Professor of [place] [employer] [facility] of Medicine  Risk manager

## 2023-02-18 NOTE — Telephone Encounter (Signed)
Patient called - states she has an appointment next Wednesday  02/24/23  for chemotherapy and that they will be drawing labs that day. She was give a written order to take with her to  have  them draw  a uric acid level - but wanting to know if we could  also add a lipid panel to this order? She states it has been close to 3 years since last drawn and she is on a statin.

## 2023-02-18 NOTE — Addendum Note (Signed)
Addended by: Tarri Glenn A on: 02/18/2023 03:22 PM   Modules accepted: Orders

## 2023-02-19 ENCOUNTER — Other Ambulatory Visit: Payer: Self-pay | Admitting: Medical-Surgical

## 2023-02-22 ENCOUNTER — Other Ambulatory Visit: Payer: Self-pay

## 2023-02-22 DIAGNOSIS — E871 Hypo-osmolality and hyponatremia: Secondary | ICD-10-CM | POA: Diagnosis not present

## 2023-02-22 DIAGNOSIS — C7989 Secondary malignant neoplasm of other specified sites: Secondary | ICD-10-CM | POA: Diagnosis not present

## 2023-02-22 DIAGNOSIS — C679 Malignant neoplasm of bladder, unspecified: Secondary | ICD-10-CM | POA: Diagnosis not present

## 2023-02-22 DIAGNOSIS — E8770 Fluid overload, unspecified: Secondary | ICD-10-CM | POA: Diagnosis not present

## 2023-02-22 DIAGNOSIS — N1831 Chronic kidney disease, stage 3a: Secondary | ICD-10-CM | POA: Diagnosis not present

## 2023-02-22 DIAGNOSIS — N139 Obstructive and reflux uropathy, unspecified: Secondary | ICD-10-CM | POA: Diagnosis not present

## 2023-02-24 ENCOUNTER — Other Ambulatory Visit: Payer: Self-pay

## 2023-02-24 ENCOUNTER — Other Ambulatory Visit: Payer: Self-pay | Admitting: Medical-Surgical

## 2023-02-25 ENCOUNTER — Ambulatory Visit: Payer: Medicare HMO | Admitting: Sports Medicine

## 2023-02-26 ENCOUNTER — Other Ambulatory Visit (HOSPITAL_COMMUNITY): Payer: Self-pay

## 2023-02-26 ENCOUNTER — Other Ambulatory Visit: Payer: Self-pay

## 2023-03-01 ENCOUNTER — Ambulatory Visit (INDEPENDENT_AMBULATORY_CARE_PROVIDER_SITE_OTHER): Payer: Medicare HMO

## 2023-03-01 ENCOUNTER — Other Ambulatory Visit: Payer: Self-pay

## 2023-03-01 ENCOUNTER — Other Ambulatory Visit (INDEPENDENT_AMBULATORY_CARE_PROVIDER_SITE_OTHER): Payer: Medicare HMO

## 2023-03-01 ENCOUNTER — Ambulatory Visit (INDEPENDENT_AMBULATORY_CARE_PROVIDER_SITE_OTHER): Payer: Medicare HMO | Admitting: Sports Medicine

## 2023-03-01 DIAGNOSIS — M170 Bilateral primary osteoarthritis of knee: Secondary | ICD-10-CM

## 2023-03-01 DIAGNOSIS — M84374A Stress fracture, right foot, initial encounter for fracture: Secondary | ICD-10-CM

## 2023-03-01 DIAGNOSIS — M79671 Pain in right foot: Secondary | ICD-10-CM | POA: Diagnosis not present

## 2023-03-01 MED ORDER — HYDROCODONE-ACETAMINOPHEN 10-325 MG PO TABS
1.0000 | ORAL_TABLET | Freq: Two times a day (BID) | ORAL | 0 refills | Status: DC | PRN
  Filled 2023-03-01: qty 60, 30d supply, fill #0

## 2023-03-01 MED ORDER — HYDROCODONE-ACETAMINOPHEN 10-325 MG PO TABS: 1.0000 | ORAL_TABLET | Freq: Two times a day (BID) | ORAL | 0 refills | Status: DC | PRN

## 2023-03-01 MED ORDER — HYALURONAN 30 MG/2ML IX SOSY
30.0000 mg | PREFILLED_SYRINGE | Freq: Once | INTRA_ARTICULAR | Status: AC
  Administered 2023-03-01: 30 mg via INTRA_ARTICULAR

## 2023-03-01 NOTE — Assessment & Plan Note (Signed)
Orthovisc 2 of 4 both knees, return in 1 week for #3 of 4. 

## 2023-03-01 NOTE — Progress Notes (Signed)
    Procedures performed today:    Procedure: Real-time Ultrasound Guided injection of the left knee Device: Samsung HS60  Verbal informed consent obtained.  Time-out conducted.  Noted no overlying erythema, induration, or other signs of local infection.  Skin prepped in a sterile fashion.  Local anesthesia: Topical Ethyl chloride.  With sterile technique and under real time ultrasound guidance: 30 mg/2 mL of OrthoVisc (sodium hyaluronate) in a prefilled syringe was injected easily into the knee through a 22-gauge needle. Completed without difficulty  Advised to call if fevers/chills, erythema, induration, drainage, or persistent bleeding.  Images permanently stored and available for review in PACS.  Impression: Technically successful ultrasound guided injection.   Procedure: Real-time Ultrasound Guided injection of the right knee Device: Samsung HS60  Verbal informed consent obtained.  Time-out conducted.  Noted no overlying erythema, induration, or other signs of local infection.  Skin prepped in a sterile fashion.  Local anesthesia: Topical Ethyl chloride.  With sterile technique and under real time ultrasound guidance: 30 mg/2 mL of OrthoVisc (sodium hyaluronate) in a prefilled syringe was injected easily into the knee through a 22-gauge needle.  Completed without difficulty  Advised to call if fevers/chills, erythema, induration, drainage, or persistent bleeding.  Images permanently stored and available for review in PACS.  Impression: Technically successful ultrasound guided injection.  Independent interpretation of notes and tests performed by another provider:   None.  Brief History, Exam, Impression, and Recommendations:    Primary osteoarthritis of both knees Orthovisc 2 of 4 both knees, return in 1 week for #3 of 4  Stress fracture of metatarsal bone of right foot Mary Stark has been complaining of pain right foot for 5 weeks now. On exam she has tenderness over the  fourth metatarsal shaft concerning for stress injury. Adding x-rays, postop shoe, will do this for 4 weeks before considering MRI. 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     ____________________________________________ Gwen Her. Dianah Field, M.D., ABFM., CAQSM., AME. Primary Care and Sports Medicine [place] [facility]  Adjunct Professor of [place] [employer] [facility] of Medicine  Risk manager

## 2023-03-01 NOTE — Assessment & Plan Note (Signed)
Mary Stark has been complaining of pain right foot for 5 weeks now. On exam she has tenderness over the fourth metatarsal shaft concerning for stress injury. Adding x-rays, postop shoe, will do this for 4 weeks before considering MRI. 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

## 2023-03-02 ENCOUNTER — Ambulatory Visit (INDEPENDENT_AMBULATORY_CARE_PROVIDER_SITE_OTHER): Payer: Medicare HMO

## 2023-03-02 DIAGNOSIS — Z1231 Encounter for screening mammogram for malignant neoplasm of breast: Secondary | ICD-10-CM | POA: Diagnosis not present

## 2023-03-03 DIAGNOSIS — Z5111 Encounter for antineoplastic chemotherapy: Secondary | ICD-10-CM | POA: Diagnosis not present

## 2023-03-03 DIAGNOSIS — C679 Malignant neoplasm of bladder, unspecified: Secondary | ICD-10-CM | POA: Diagnosis not present

## 2023-03-09 ENCOUNTER — Ambulatory Visit (INDEPENDENT_AMBULATORY_CARE_PROVIDER_SITE_OTHER): Payer: Medicare HMO | Admitting: Sports Medicine

## 2023-03-09 ENCOUNTER — Other Ambulatory Visit (HOSPITAL_COMMUNITY): Payer: Self-pay

## 2023-03-09 ENCOUNTER — Other Ambulatory Visit (INDEPENDENT_AMBULATORY_CARE_PROVIDER_SITE_OTHER): Payer: Medicare HMO

## 2023-03-09 DIAGNOSIS — M170 Bilateral primary osteoarthritis of knee: Secondary | ICD-10-CM

## 2023-03-09 NOTE — Progress Notes (Signed)
    Procedures performed today:    Procedure: Real-time Ultrasound Guided injection of the left knee Device: Samsung HS60  Verbal informed consent obtained.  Time-out conducted.  Noted no overlying erythema, induration, or other signs of local infection.  Skin prepped in a sterile fashion.  Local anesthesia: Topical Ethyl chloride.  With sterile technique and under real time ultrasound guidance: 30 mg/2 mL of OrthoVisc (sodium hyaluronate) in a prefilled syringe was injected easily into the knee through a 22-gauge needle. Completed without difficulty  Advised to call if fevers/chills, erythema, induration, drainage, or persistent bleeding.  Images permanently stored and available for review in PACS.  Impression: Technically successful ultrasound guided injection.   Procedure: Real-time Ultrasound Guided injection of the right knee Device: Samsung HS60  Verbal informed consent obtained.  Time-out conducted.  Noted no overlying erythema, induration, or other signs of local infection.  Skin prepped in a sterile fashion.  Local anesthesia: Topical Ethyl chloride.  With sterile technique and under real time ultrasound guidance: 30 mg/2 mL of OrthoVisc (sodium hyaluronate) in a prefilled syringe was injected easily into the knee through a 22-gauge needle. Completed without difficulty  Advised to call if fevers/chills, erythema, induration, drainage, or persistent bleeding.  Images permanently stored and available for review in PACS.  Impression: Technically successful ultrasound guided injection.  Independent interpretation of notes and tests performed by another provider:   None.  Brief History, Exam, Impression, and Recommendations:    Primary osteoarthritis of both knees Orthovisc 3 of 4 both knees, return in 1 week for #4 of 4    ____________________________________________ Dolton Shaker J. Declin Rajan, M.D., ABFM., CAQSM., AME. Primary Care and Sports Medicine [place]  [facility]  Adjunct Professor of Family Medicine  [employer]  [employer]er 

## 2023-03-09 NOTE — Assessment & Plan Note (Signed)
Orthovisc 3 of 4 both knees, return in 1 week for #4 of 4 

## 2023-03-11 DIAGNOSIS — C679 Malignant neoplasm of bladder, unspecified: Secondary | ICD-10-CM | POA: Diagnosis not present

## 2023-03-19 ENCOUNTER — Ambulatory Visit (INDEPENDENT_AMBULATORY_CARE_PROVIDER_SITE_OTHER): Payer: Medicare HMO | Admitting: Sports Medicine

## 2023-03-19 ENCOUNTER — Other Ambulatory Visit (INDEPENDENT_AMBULATORY_CARE_PROVIDER_SITE_OTHER): Payer: Medicare HMO

## 2023-03-19 DIAGNOSIS — M170 Bilateral primary osteoarthritis of knee: Secondary | ICD-10-CM | POA: Diagnosis not present

## 2023-03-19 DIAGNOSIS — M79674 Pain in right toe(s): Secondary | ICD-10-CM

## 2023-03-19 NOTE — Assessment & Plan Note (Signed)
Orthovisc No. 4 of 4 both knees, return as needed. 

## 2023-03-19 NOTE — Assessment & Plan Note (Signed)
Persistent pain right fifth toe PIP, injected today, x-rays were negative. Return in 4 to 6 weeks for this.

## 2023-03-19 NOTE — Progress Notes (Signed)
    Procedures performed today:    Procedure: Real-time Ultrasound Guided injection of the left knee Device: Samsung HS60  Verbal informed consent obtained.  Time-out conducted.  Noted no overlying erythema, induration, or other signs of local infection.  Skin prepped in a sterile fashion.  Local anesthesia: Topical Ethyl chloride.  With sterile technique and under real time ultrasound guidance: 30 mg/2 mL of OrthoVisc (sodium hyaluronate) in a prefilled syringe was injected easily into the knee through a 22-gauge needle. Completed without difficulty  Advised to call if fevers/chills, erythema, induration, drainage, or persistent bleeding.  Images permanently stored and available for review in PACS.  Impression: Technically successful ultrasound guided injection.   Procedure: Real-time Ultrasound Guided injection of the right knee Device: Samsung HS60  Verbal informed consent obtained.  Time-out conducted.  Noted no overlying erythema, induration, or other signs of local infection.  Skin prepped in a sterile fashion.  Local anesthesia: Topical Ethyl chloride.  With sterile technique and under real time ultrasound guidance: 30 mg/2 mL of OrthoVisc (sodium hyaluronate) in a prefilled syringe was injected easily into the knee through a 22-gauge needle.  Completed without difficulty  Advised to call if fevers/chills, erythema, induration, drainage, or persistent bleeding.  Images permanently stored and available for review in PACS.  Impression: Technically successful ultrasound guided injection.  Procedure:  Injection of right fifth PIP of the foot Consent obtained and verified. Time-out conducted. Noted no overlying erythema, induration, or other signs of local infection. Skin prepped in a sterile fashion. Topical analgesic spray: Ethyl chloride. Completed without difficulty. Meds: 1/2 cc lidocaine, 1 cc kenalog 40 injected easily into the right fifth toe PIP Advised to call if  fevers/chills, erythema, induration, drainage, or persistent bleeding.  Independent interpretation of notes and tests performed by another provider:   None.  Brief History, Exam, Impression, and Recommendations:    Primary osteoarthritis of both knees Orthovisc No. 4 of 4 both knees, return as needed  Toe pain, right Persistent pain right fifth toe PIP, injected today, x-rays were negative. Return in 4 to 6 weeks for this.    ____________________________________________ Ihor Austin. Benjamin Stain, M.D., ABFM., CAQSM., AME. Primary Care and Sports Medicine [place] [facility]  Adjunct Professor of Family Medicine  [place] [employer] [facility] of Medicine  Restaurant manager, fast food

## 2023-03-21 DIAGNOSIS — Z880 Allergy status to penicillin: Secondary | ICD-10-CM | POA: Diagnosis not present

## 2023-03-21 DIAGNOSIS — I10 Essential (primary) hypertension: Secondary | ICD-10-CM | POA: Diagnosis not present

## 2023-03-21 DIAGNOSIS — Z888 Allergy status to other drugs, medicaments and biological substances status: Secondary | ICD-10-CM | POA: Diagnosis not present

## 2023-03-21 DIAGNOSIS — S61011A Laceration without foreign body of right thumb without damage to nail, initial encounter: Secondary | ICD-10-CM | POA: Diagnosis not present

## 2023-03-21 DIAGNOSIS — F17290 Nicotine dependence, other tobacco product, uncomplicated: Secondary | ICD-10-CM | POA: Diagnosis not present

## 2023-03-21 DIAGNOSIS — W268XXA Contact with other sharp object(s), not elsewhere classified, initial encounter: Secondary | ICD-10-CM | POA: Diagnosis not present

## 2023-03-21 DIAGNOSIS — Z79899 Other long term (current) drug therapy: Secondary | ICD-10-CM | POA: Diagnosis not present

## 2023-03-21 DIAGNOSIS — E785 Hyperlipidemia, unspecified: Secondary | ICD-10-CM | POA: Diagnosis not present

## 2023-03-23 DIAGNOSIS — R590 Localized enlarged lymph nodes: Secondary | ICD-10-CM | POA: Diagnosis not present

## 2023-03-23 DIAGNOSIS — J702 Acute drug-induced interstitial lung disorders: Secondary | ICD-10-CM | POA: Diagnosis not present

## 2023-03-23 DIAGNOSIS — N1339 Other hydronephrosis: Secondary | ICD-10-CM | POA: Diagnosis not present

## 2023-03-23 DIAGNOSIS — R599 Enlarged lymph nodes, unspecified: Secondary | ICD-10-CM | POA: Diagnosis not present

## 2023-03-23 DIAGNOSIS — H524 Presbyopia: Secondary | ICD-10-CM | POA: Diagnosis not present

## 2023-03-23 DIAGNOSIS — N134 Hydroureter: Secondary | ICD-10-CM | POA: Diagnosis not present

## 2023-03-23 DIAGNOSIS — Z960 Presence of urogenital implants: Secondary | ICD-10-CM | POA: Diagnosis not present

## 2023-03-23 DIAGNOSIS — J984 Other disorders of lung: Secondary | ICD-10-CM | POA: Diagnosis not present

## 2023-03-23 DIAGNOSIS — R911 Solitary pulmonary nodule: Secondary | ICD-10-CM | POA: Diagnosis not present

## 2023-03-23 DIAGNOSIS — C679 Malignant neoplasm of bladder, unspecified: Secondary | ICD-10-CM | POA: Diagnosis not present

## 2023-03-24 DIAGNOSIS — C679 Malignant neoplasm of bladder, unspecified: Secondary | ICD-10-CM | POA: Diagnosis not present

## 2023-03-24 DIAGNOSIS — Z79899 Other long term (current) drug therapy: Secondary | ICD-10-CM | POA: Diagnosis not present

## 2023-03-24 DIAGNOSIS — Z5112 Encounter for antineoplastic immunotherapy: Secondary | ICD-10-CM | POA: Diagnosis not present

## 2023-03-25 ENCOUNTER — Other Ambulatory Visit (HOSPITAL_COMMUNITY): Payer: Self-pay

## 2023-03-25 ENCOUNTER — Ambulatory Visit (INDEPENDENT_AMBULATORY_CARE_PROVIDER_SITE_OTHER): Payer: Medicare HMO | Admitting: Medical-Surgical

## 2023-03-25 ENCOUNTER — Other Ambulatory Visit: Payer: Self-pay

## 2023-03-25 ENCOUNTER — Encounter: Payer: Self-pay | Admitting: Medical-Surgical

## 2023-03-25 VITALS — BP 113/72 | HR 68 | Resp 20 | Ht 66.0 in | Wt 135.5 lb

## 2023-03-25 DIAGNOSIS — S6991XD Unspecified injury of right wrist, hand and finger(s), subsequent encounter: Secondary | ICD-10-CM

## 2023-03-25 DIAGNOSIS — R4589 Other symptoms and signs involving emotional state: Secondary | ICD-10-CM

## 2023-03-25 DIAGNOSIS — F410 Panic disorder [episodic paroxysmal anxiety] without agoraphobia: Secondary | ICD-10-CM | POA: Diagnosis not present

## 2023-03-25 MED ORDER — HYDROCODONE-ACETAMINOPHEN 10-325 MG PO TABS
1.0000 | ORAL_TABLET | Freq: Two times a day (BID) | ORAL | 0 refills | Status: DC | PRN
  Filled 2023-03-25 – 2023-04-06 (×2): qty 60, 30d supply, fill #0

## 2023-03-25 MED ORDER — CEPHALEXIN 500 MG PO CAPS
500.0000 mg | ORAL_CAPSULE | Freq: Three times a day (TID) | ORAL | 0 refills | Status: DC
Start: 2023-03-25 — End: 2023-07-12
  Filled 2023-03-25: qty 21, 7d supply, fill #0

## 2023-03-25 MED ORDER — CLONAZEPAM 1 MG PO TABS
1.0000 mg | ORAL_TABLET | Freq: Every day | ORAL | 2 refills | Status: DC
Start: 2023-03-25 — End: 2023-06-21
  Filled 2023-03-25: qty 30, 30d supply, fill #0
  Filled 2023-04-22: qty 30, 30d supply, fill #1
  Filled 2023-05-19 – 2023-05-20 (×2): qty 30, 30d supply, fill #2

## 2023-03-25 NOTE — Progress Notes (Signed)
        Established patient visit  History, exam, impression, and plan:  1. Injury of right thumb, subsequent encounter Was seen in the ED on 03/21/23 after having an accident that resulted in a right thumb injury. Reports that she tried to catch a vase that was falling and her hand rubved across a "slice and dice" tool in the kitchen. Her thumb came in contact with the blade and a chunk to tissue was cut off. At the ED, she had a blood vessel tied off to control the bleeding and was sent home with instructions for dressing changes daily. For pain, she was given a nerve block. The nerve block has worn off and she has been struggling with the significant pain. Had Norco at home from a recent stress fracture which has helped a bit. Today, the pain is better with the use of an ice pack. Has been changing the dressing as instructed. Was not put on antibiotics at the ED but they did make sure her Td/Tdap was up to date. On exam, the laceration on the tip of the thumb appears to be healing well although the area is still quite tender. Discussed options to help with healing. Wash with warm, soapy water once daily. Rinse well and pat dry. Place a small piece of Xeroform over the wound, cover with gauze, and wrap with Coban. Refilling Norco for sparing use prn. Continue cold therapy. Due to concern for her immunocompromised status and the location of the wound, adding Keflex 500mg  TID x 7 days.  - HYDROcodone-acetaminophen (NORCO) 10-325 MG tablet; Take 1 tablet by mouth 2 (two) times daily as needed.  Dispense: 60 tablet; Refill: 0 - cephALEXin (KEFLEX) 500 MG capsule; Take 1 capsule (500 mg total) by mouth 3 (three) times daily.  Dispense: 21 capsule; Refill: 0  2. Panic attack 3. Anxiety about health Having difficulty getting her prescription from the [facility] pharmacy and has run out. Would like this switched back to the [facility] pharmacy. Klonopin resent as requested.  - clonazePAM (KLONOPIN) 1 MG tablet; Take 1  tablet (1 mg total) by mouth at bedtime.  Dispense: 30 tablet; Refill: 2  Procedures performed this visit: None.  Return if symptoms worsen or fail to improve.  __________________________________ Thayer Ohm, DNP, APRN, FNP-BC Primary Care and Sports Medicine [facility] [place]

## 2023-04-01 ENCOUNTER — Other Ambulatory Visit: Payer: Self-pay | Admitting: Medical-Surgical

## 2023-04-06 ENCOUNTER — Other Ambulatory Visit: Payer: Self-pay

## 2023-04-06 ENCOUNTER — Other Ambulatory Visit (HOSPITAL_COMMUNITY): Payer: Self-pay

## 2023-04-08 ENCOUNTER — Other Ambulatory Visit (HOSPITAL_COMMUNITY): Payer: Self-pay

## 2023-04-09 ENCOUNTER — Other Ambulatory Visit (HOSPITAL_COMMUNITY): Payer: Self-pay

## 2023-04-12 ENCOUNTER — Other Ambulatory Visit (HOSPITAL_COMMUNITY): Payer: Self-pay

## 2023-04-12 ENCOUNTER — Ambulatory Visit (INDEPENDENT_AMBULATORY_CARE_PROVIDER_SITE_OTHER): Payer: Medicare HMO | Admitting: Sports Medicine

## 2023-04-12 DIAGNOSIS — M79674 Pain in right toe(s): Secondary | ICD-10-CM | POA: Diagnosis not present

## 2023-04-12 DIAGNOSIS — M170 Bilateral primary osteoarthritis of knee: Secondary | ICD-10-CM

## 2023-04-12 MED ORDER — CELECOXIB 100 MG PO CAPS
100.0000 mg | ORAL_CAPSULE | Freq: Every day | ORAL | 3 refills | Status: DC | PRN
Start: 2023-04-12 — End: 2023-07-12
  Filled 2023-04-12: qty 60, 30d supply, fill #0
  Filled 2023-05-19: qty 60, 30d supply, fill #1
  Filled 2023-06-21: qty 60, 30d supply, fill #2

## 2023-04-12 NOTE — Progress Notes (Signed)
    Procedures performed today:    None.  Independent interpretation of notes and tests performed by another provider:   None.  Brief History, Exam, Impression, and Recommendations:    Primary osteoarthritis of both knees Jolette returns, she has bilateral knee osteoarthritis, severe and end-stage medial compartment. Approximately 3 weeks ago we finished a series of Orthovisc, right knee is doing well, left knee continues to hurt. She is not interested in arthroplasty, referral to Dr. Laurian Brim for discussion of genicular RFA. She does have very mild chronic renal insufficiency, I am going to add low-dose Celebrex to be used twice daily, we can recheck her renal function in a month to make sure things have not changed significantly. At this point I think quality of life is more important than a slight bump in her renal function.  Toe pain, right Months of pain right toe fifth PIP, she does have an ulceration between the fourth and fifth toe. X-rays unrevealing, as symptoms have been present for a month, no improvement with right fifth PIP injection we will proceed with MRI to ensure were not missing an osteomyelitis here. Renal function is in the chart from less than 1 month ago.    ____________________________________________ Ihor Austin. Benjamin Stain, M.D., ABFM., CAQSM., AME. Primary Care and Sports Medicine [place] [facility]  Adjunct Professor of Family Medicine  [place] [employer] Medicine  Restaurant manager, fast food

## 2023-04-12 NOTE — Assessment & Plan Note (Addendum)
Azure returns, she has bilateral knee osteoarthritis, severe and end-stage medial compartment. Approximately 3 weeks ago we finished a series of Orthovisc, right knee is doing well, left knee continues to hurt. She is not interested in arthroplasty, referral to Dr. Laurian Brim for discussion of genicular RFA. She does have very mild chronic renal insufficiency, I am going to add low-dose Celebrex to be used twice daily, we can recheck her renal function in a month to make sure things have not changed significantly. At this point I think quality of life is more important than a slight bump in her renal function.

## 2023-04-12 NOTE — Assessment & Plan Note (Signed)
Months of pain right toe fifth PIP, she does have an ulceration between the fourth and fifth toe. X-rays unrevealing, as symptoms have been present for a month, no improvement with right fifth PIP injection we will proceed with MRI to ensure were not missing an osteomyelitis here. Renal function is in the chart from less than 1 month ago.

## 2023-04-13 ENCOUNTER — Other Ambulatory Visit (HOSPITAL_COMMUNITY): Payer: Self-pay

## 2023-04-13 ENCOUNTER — Encounter: Payer: Self-pay | Admitting: Oncology

## 2023-04-14 DIAGNOSIS — C679 Malignant neoplasm of bladder, unspecified: Secondary | ICD-10-CM | POA: Diagnosis not present

## 2023-04-14 DIAGNOSIS — Z5112 Encounter for antineoplastic immunotherapy: Secondary | ICD-10-CM | POA: Diagnosis not present

## 2023-04-14 DIAGNOSIS — E785 Hyperlipidemia, unspecified: Secondary | ICD-10-CM | POA: Diagnosis not present

## 2023-04-14 DIAGNOSIS — Z79899 Other long term (current) drug therapy: Secondary | ICD-10-CM | POA: Diagnosis not present

## 2023-04-15 ENCOUNTER — Encounter: Payer: Self-pay | Admitting: Sports Medicine

## 2023-04-15 DIAGNOSIS — M1712 Unilateral primary osteoarthritis, left knee: Secondary | ICD-10-CM | POA: Diagnosis not present

## 2023-04-15 DIAGNOSIS — M170 Bilateral primary osteoarthritis of knee: Secondary | ICD-10-CM

## 2023-04-16 ENCOUNTER — Other Ambulatory Visit: Payer: Self-pay | Admitting: Medical-Surgical

## 2023-04-22 ENCOUNTER — Other Ambulatory Visit (HOSPITAL_COMMUNITY): Payer: Self-pay

## 2023-04-22 ENCOUNTER — Other Ambulatory Visit: Payer: Self-pay

## 2023-04-29 DIAGNOSIS — S20212A Contusion of left front wall of thorax, initial encounter: Secondary | ICD-10-CM | POA: Diagnosis not present

## 2023-04-29 DIAGNOSIS — J069 Acute upper respiratory infection, unspecified: Secondary | ICD-10-CM | POA: Diagnosis not present

## 2023-04-29 DIAGNOSIS — M1712 Unilateral primary osteoarthritis, left knee: Secondary | ICD-10-CM | POA: Diagnosis not present

## 2023-04-29 DIAGNOSIS — Z20822 Contact with and (suspected) exposure to covid-19: Secondary | ICD-10-CM | POA: Diagnosis not present

## 2023-04-30 ENCOUNTER — Encounter: Payer: Self-pay | Admitting: Sports Medicine

## 2023-04-30 DIAGNOSIS — N135 Crossing vessel and stricture of ureter without hydronephrosis: Secondary | ICD-10-CM | POA: Diagnosis not present

## 2023-04-30 DIAGNOSIS — I10 Essential (primary) hypertension: Secondary | ICD-10-CM | POA: Diagnosis not present

## 2023-04-30 DIAGNOSIS — Z9221 Personal history of antineoplastic chemotherapy: Secondary | ICD-10-CM | POA: Diagnosis not present

## 2023-04-30 DIAGNOSIS — K219 Gastro-esophageal reflux disease without esophagitis: Secondary | ICD-10-CM | POA: Diagnosis not present

## 2023-04-30 DIAGNOSIS — Z79899 Other long term (current) drug therapy: Secondary | ICD-10-CM | POA: Diagnosis not present

## 2023-04-30 DIAGNOSIS — N131 Hydronephrosis with ureteral stricture, not elsewhere classified: Secondary | ICD-10-CM | POA: Diagnosis not present

## 2023-04-30 DIAGNOSIS — C679 Malignant neoplasm of bladder, unspecified: Secondary | ICD-10-CM | POA: Diagnosis not present

## 2023-05-03 ENCOUNTER — Other Ambulatory Visit: Payer: Medicare HMO

## 2023-05-06 ENCOUNTER — Encounter: Payer: Self-pay | Admitting: Sports Medicine

## 2023-05-11 ENCOUNTER — Ambulatory Visit (INDEPENDENT_AMBULATORY_CARE_PROVIDER_SITE_OTHER): Payer: Medicare HMO

## 2023-05-11 ENCOUNTER — Telehealth: Payer: Self-pay | Admitting: Medical-Surgical

## 2023-05-11 DIAGNOSIS — N183 Chronic kidney disease, stage 3 unspecified: Secondary | ICD-10-CM

## 2023-05-11 DIAGNOSIS — L97519 Non-pressure chronic ulcer of other part of right foot with unspecified severity: Secondary | ICD-10-CM

## 2023-05-11 DIAGNOSIS — M79674 Pain in right toe(s): Secondary | ICD-10-CM

## 2023-05-11 DIAGNOSIS — M19071 Primary osteoarthritis, right ankle and foot: Secondary | ICD-10-CM | POA: Diagnosis not present

## 2023-05-11 MED ORDER — GADOBUTROL 1 MMOL/ML IV SOLN
6.0000 mL | Freq: Once | INTRAVENOUS | Status: AC | PRN
  Administered 2023-05-11: 6 mL via INTRAVENOUS

## 2023-05-11 NOTE — Telephone Encounter (Signed)
Noted  

## 2023-05-11 NOTE — Telephone Encounter (Signed)
Pt called.  Don't worry with filling out surgical form, Medicare will not cover Mary Stark so she doubts they will cover knee surgery.

## 2023-05-11 NOTE — Telephone Encounter (Signed)
I will go ahead and order her BMP with GFR.

## 2023-05-11 NOTE — Telephone Encounter (Signed)
Pt called. She states she needs lab work because she is on Celebrex(hard on kidneys)

## 2023-05-18 ENCOUNTER — Telehealth: Payer: Self-pay | Admitting: Medical-Surgical

## 2023-05-18 NOTE — Telephone Encounter (Signed)
[facility] called about Surgical Clearance form they need it filled out asap patient called and gave incorrect message please contact [phone] to clarify

## 2023-05-18 NOTE — Telephone Encounter (Signed)
Patient needs preop clearance appt please.

## 2023-05-19 ENCOUNTER — Other Ambulatory Visit: Payer: Self-pay

## 2023-05-19 ENCOUNTER — Other Ambulatory Visit (HOSPITAL_COMMUNITY): Payer: Self-pay

## 2023-05-20 ENCOUNTER — Ambulatory Visit: Payer: Medicare HMO | Admitting: Sports Medicine

## 2023-05-20 ENCOUNTER — Other Ambulatory Visit: Payer: Self-pay

## 2023-05-25 ENCOUNTER — Other Ambulatory Visit (HOSPITAL_COMMUNITY): Payer: Self-pay

## 2023-05-25 ENCOUNTER — Other Ambulatory Visit: Payer: Self-pay

## 2023-05-25 ENCOUNTER — Ambulatory Visit (INDEPENDENT_AMBULATORY_CARE_PROVIDER_SITE_OTHER): Payer: Medicare HMO | Admitting: Sports Medicine

## 2023-05-25 DIAGNOSIS — R509 Fever, unspecified: Secondary | ICD-10-CM | POA: Diagnosis not present

## 2023-05-25 DIAGNOSIS — M170 Bilateral primary osteoarthritis of knee: Secondary | ICD-10-CM

## 2023-05-25 DIAGNOSIS — M79674 Pain in right toe(s): Secondary | ICD-10-CM

## 2023-05-25 LAB — POCT URINALYSIS DIP (CLINITEK)
Bilirubin, UA: NEGATIVE
Blood, UA: NEGATIVE
Glucose, UA: NEGATIVE mg/dL
Ketones, POC UA: NEGATIVE mg/dL
Nitrite, UA: NEGATIVE
POC PROTEIN,UA: 30 — AB
Spec Grav, UA: 1.01 (ref 1.010–1.025)
Urobilinogen, UA: 0.2 E.U./dL
pH, UA: 7 (ref 5.0–8.0)

## 2023-05-25 MED ORDER — PREDNISONE 50 MG PO TABS
50.0000 mg | ORAL_TABLET | Freq: Every day | ORAL | 0 refills | Status: DC
Start: 2023-05-25 — End: 2023-07-01
  Filled 2023-05-25 (×2): qty 5, 5d supply, fill #0

## 2023-05-25 MED ORDER — AZITHROMYCIN 250 MG PO TABS
ORAL_TABLET | ORAL | 0 refills | Status: AC
Start: 2023-05-25 — End: 2023-05-30
  Filled 2023-05-25 (×2): qty 6, 5d supply, fill #0

## 2023-05-25 NOTE — Assessment & Plan Note (Signed)
Ultimately MRI revealed osteitis versus early osteomyelitis, I would like podiatry to weigh in, she does have an appointment with Dr. Karle Barr tomorrow.

## 2023-05-25 NOTE — Assessment & Plan Note (Signed)
This is a pleasant 67 year old female, she has had about 6 days of fever up to 102 Fahrenheit, muscle aches, body aches, fatigue. No dysuria, urgency, burning, frequency, no abdominal pain. On exam she appears tired, oropharyngeal exam is unrevealing, she does have diffuse expiratory wheezes. Abdomen is not painful. She is out of the window for COVID and flu antiviral so we will hold off on testing, we will would like a chest x-ray, prednisone, azithromycin. She has some albuterol at home that she can use as needed for cough, shortness of breath, wheeze. She can return to see [place], either me or her PCP in 1 to 2 weeks if not doing significantly better.

## 2023-05-25 NOTE — Assessment & Plan Note (Signed)
Mary Stark returns, she has bilateral knee osteoarthritis, severe, end-stage, we finished Orthovisc few months ago, right knee did well but the left knee continues to hurt, she is not interested in arthroplasty, we sent her to Mary Stark for discussion of genicular RFA, she was not approved by insurance. We added low-dose Celebrex, it was insufficient. At this point I would like her to consult with Mary Stark for discussion of genicular artery embolization.

## 2023-05-25 NOTE — Addendum Note (Signed)
Addended by: Carren Rang A on: 05/25/2023 04:18 PM   Modules accepted: Orders

## 2023-05-25 NOTE — Progress Notes (Signed)
    Procedures performed today:    None.  Independent interpretation of notes and tests performed by another provider:   None.  Brief History, Exam, Impression, and Recommendations:    Fever This is a pleasant 67 year old female, she has had about 6 days of fever up to 102 Fahrenheit, muscle aches, body aches, fatigue. No dysuria, urgency, burning, frequency, no abdominal pain. On exam she appears tired, oropharyngeal exam is unrevealing, she does have diffuse expiratory wheezes. Abdomen is not painful. She is out of the window for COVID and flu antiviral so we will hold off on testing, we will would like a chest x-ray, prednisone, azithromycin. She has some albuterol at home that she can use as needed for cough, shortness of breath, wheeze. She can return to see [place], either me or her PCP in 1 to 2 weeks if not doing significantly better.  Primary osteoarthritis of both knees Mary Stark returns, she has bilateral knee osteoarthritis, severe, end-stage, we finished Orthovisc few months ago, right knee did well but the left knee continues to hurt, she is not interested in arthroplasty, we sent her to Dr. Duane Boston for discussion of genicular RFA, she was not approved by insurance. We added low-dose Celebrex, it was insufficient. At this point I would like her to consult with Dr. Elby Showers for discussion of genicular artery embolization.  Toe pain, right Ultimately MRI revealed osteitis versus early osteomyelitis, I would like podiatry to weigh in, she does have an appointment with Dr. Karle Barr tomorrow.    ____________________________________________ Mary Austin. Benjamin Stain, M.D., ABFM., CAQSM., AME. Primary Care and Sports Medicine [place] [facility]  Adjunct Professor of Family Medicine  [place] [employer] [facility] [employer] Medicine  Restaurant manager, fast food

## 2023-05-26 DIAGNOSIS — L97512 Non-pressure chronic ulcer of other part of right foot with fat layer exposed: Secondary | ICD-10-CM | POA: Diagnosis not present

## 2023-05-26 DIAGNOSIS — M2041 Other hammer toe(s) (acquired), right foot: Secondary | ICD-10-CM | POA: Diagnosis not present

## 2023-05-26 DIAGNOSIS — C679 Malignant neoplasm of bladder, unspecified: Secondary | ICD-10-CM | POA: Diagnosis not present

## 2023-05-26 DIAGNOSIS — Z79899 Other long term (current) drug therapy: Secondary | ICD-10-CM | POA: Diagnosis not present

## 2023-05-26 DIAGNOSIS — Z5112 Encounter for antineoplastic immunotherapy: Secondary | ICD-10-CM | POA: Diagnosis not present

## 2023-05-30 ENCOUNTER — Encounter (INDEPENDENT_AMBULATORY_CARE_PROVIDER_SITE_OTHER): Payer: Medicare HMO | Admitting: Sports Medicine

## 2023-05-30 DIAGNOSIS — S6991XD Unspecified injury of right wrist, hand and finger(s), subsequent encounter: Secondary | ICD-10-CM | POA: Diagnosis not present

## 2023-06-01 ENCOUNTER — Other Ambulatory Visit (HOSPITAL_COMMUNITY): Payer: Self-pay

## 2023-06-01 MED ORDER — HYDROCODONE-ACETAMINOPHEN 10-325 MG PO TABS: 1.0000 | ORAL_TABLET | Freq: Two times a day (BID) | ORAL | 0 refills | Status: DC | PRN

## 2023-06-01 NOTE — Telephone Encounter (Signed)
I spent 5 total minutes of online digital evaluation and management services in this patient-initiated request for online care. 

## 2023-06-02 DIAGNOSIS — C679 Malignant neoplasm of bladder, unspecified: Secondary | ICD-10-CM | POA: Diagnosis not present

## 2023-06-02 DIAGNOSIS — Z79899 Other long term (current) drug therapy: Secondary | ICD-10-CM | POA: Diagnosis not present

## 2023-06-02 DIAGNOSIS — Z5112 Encounter for antineoplastic immunotherapy: Secondary | ICD-10-CM | POA: Diagnosis not present

## 2023-06-04 DIAGNOSIS — I129 Hypertensive chronic kidney disease with stage 1 through stage 4 chronic kidney disease, or unspecified chronic kidney disease: Secondary | ICD-10-CM | POA: Diagnosis not present

## 2023-06-04 DIAGNOSIS — E785 Hyperlipidemia, unspecified: Secondary | ICD-10-CM | POA: Diagnosis not present

## 2023-06-04 DIAGNOSIS — M170 Bilateral primary osteoarthritis of knee: Secondary | ICD-10-CM | POA: Diagnosis not present

## 2023-06-04 DIAGNOSIS — M5416 Radiculopathy, lumbar region: Secondary | ICD-10-CM | POA: Diagnosis not present

## 2023-06-04 DIAGNOSIS — N179 Acute kidney failure, unspecified: Secondary | ICD-10-CM | POA: Diagnosis not present

## 2023-06-04 DIAGNOSIS — C679 Malignant neoplasm of bladder, unspecified: Secondary | ICD-10-CM | POA: Diagnosis not present

## 2023-06-04 DIAGNOSIS — D62 Acute posthemorrhagic anemia: Secondary | ICD-10-CM | POA: Diagnosis not present

## 2023-06-04 DIAGNOSIS — M2041 Other hammer toe(s) (acquired), right foot: Secondary | ICD-10-CM | POA: Diagnosis not present

## 2023-06-04 DIAGNOSIS — M25812 Other specified joint disorders, left shoulder: Secondary | ICD-10-CM | POA: Diagnosis not present

## 2023-06-04 DIAGNOSIS — N183 Chronic kidney disease, stage 3 unspecified: Secondary | ICD-10-CM | POA: Diagnosis not present

## 2023-06-04 NOTE — Progress Notes (Signed)
Chief Complaint: Patient was seen in virtual consultation today for left knee pain  Referring Physician(s): Thekkekandam,Thomas J  History of Present Illness: Mary Stark is a 67 y.o. female with a medical history significant for advanced stage bladder cancer currently on Keytruda, HTN, alcohol/tobacco use, hepatitis C, CKD, anxiety/depression, hydronephrosis s/p bilateral stents (2021) and bladder cancer. She is familiar to IR from a lymph node biopsy and port-a-catheter placement 2021. She also has a history of bilateral knee osteoarthritis and has had steroid injections and the Orthovisc series which she finished a few months ago. The right knee did well but the left knee continues to hurt. The patient is not interested in arthroplasty.  She was referred for genicular RFA but was denied by insurance.   She has now been referred to IR to discuss genicular artery embolization.   Her left knee pain waxes and wanes, and is most severe when walking and turning. The pain occasionally wakes her up at night when it is severe.  Given her advanced stage bladder cancer diagnosis, she is against having surgery to deal with her knee pain and wants to pursue a minimally invasive option.  Womac Pain Score = 46/96 VAS Pain Score = 3/10  Past Medical History:  Diagnosis Date   Adenocarcinoma of bladder, stage 4 (HCC) 10/2020   oncologist--- dr Clelia Croft and urologist-- dr pace;  w/ mets , started chemo 12/ 2021 and bilateral ureteral stents to treat hydronephrosis   Alcohol dependence (HCC)    drank heavily years ago doesn't now 10/22/2021   Anemia due to chemotherapy    Bilateral hydronephrosis    urologsit--- dr pace;   chronic issue,  treated with ureteral stents   CKD (chronic kidney disease), stage III (HCC)    Diverticulosis of colon    Encounter for antineoplastic chemotherapy    Family history of adverse reaction to anesthesia    father had a "tight" airway   GAD (generalized  anxiety disorder)    GERD (gastroesophageal reflux disease)    History of 2019 novel coronavirus disease (COVID-19) 08/13/2020   positive result in epic 08-19-2020,  hospital admission in epic,  covid pneumonia with hypoxia respiratory failure, discharged with oxygen for 6 weeks   History of adenomatous polyp of colon    History of chemotherapy last done 05-13-2021   History of colitis 03/2018   acute colitis with sepsis   History of hepatitis C    completed treatment in 2012   History of kidney stones    History of rheumatic fever as a child    per pt no valvular issues   History of sepsis    11/ 2021hospital admission in epic due to pyelonephritis due to hydronephrosis, resolved//   urosepsis 2022   History of skin cancer    excision from nose   History of small bowel obstruction 05/25/2022   s/p  right hemicolectomy   Hyperlipidemia    Hypertension    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HCC)    MDD (major depressive disorder)    Metastasis from bladder cancer (HCC)    Neoplastic (malignant) related fatigue    OA (osteoarthritis)    Back and lt knee   Palliative care patient    Port-A-Cath in place 11/18/2020   Seasonal allergies    Vitamin D deficiency    Wears glasses     Past Surgical History:  Procedure Laterality Date   ANAL RECTAL MANOMETRY N/A 01/31/2020   Procedure: ANO RECTAL MANOMETRY;  Surgeon: Willis Modena, MD;  Location: [facility];  Service: Endoscopy;  Laterality: N/A;   CESAREAN SECTION  1984   COLONOSCOPY WITH PROPOFOL  05/07/2022   [facility]--- dr outlaw   CYSTOSCOPY W/ URETERAL STENT PLACEMENT Bilateral 01/21/2021   Procedure: CYSTOSCOPY WITH STENT REPLACEMENT;  Surgeon: Noel Christmas, MD;  Location: [facility];  Service: Urology;  Laterality: Bilateral;  1 HR   CYSTOSCOPY W/ URETERAL STENT PLACEMENT Bilateral 05/20/2021   Procedure: CYSTOSCOPY WITH RETROGRADE PYELOGRAM/URETERAL STENT EXCHANGE;  Surgeon: Noel Christmas, MD;  Location:  [facility];  Service: Urology;  Laterality: Bilateral;   CYSTOSCOPY W/ URETERAL STENT PLACEMENT Bilateral 10/28/2021   Procedure: CYSTOSCOPY WITH RETROGRADE PYELOGRAM/URETERAL STENT EXCHANGE;  Surgeon: Noel Christmas, MD;  Location: [facility];  Service: Urology;  Laterality: Bilateral;   CYSTOSCOPY W/ URETERAL STENT PLACEMENT Bilateral 04/28/2022   Procedure: CYSTOSCOPY Otilio Miu STENT EXCHANGE;  Surgeon: Noel Christmas, MD;  Location[facility];  Service: Urology;  Laterality: Bilateral;  1 HR   CYSTOSCOPY W/ URETERAL STENT PLACEMENT Bilateral 11/17/2022   Procedure: CYSTOSCOPY WITH RETROGRADE PYELOGRAM/URETERAL STENT REPLACEMENT;  Surgeon: Noel Christmas, MD;  Location: [facility];  Service: Urology;  Laterality: Bilateral;   CYSTOSCOPY WITH RETROGRADE PYELOGRAM, URETEROSCOPY AND STENT PLACEMENT Bilateral 06/11/2020   Procedure: CYSTOSCOPY WITH RETROGRADE PYELOGRAM, URETEROSCOPY AND STENT PLACEMENT, RIGHT URETERAL BIOPSY;  Surgeon: Noel Christmas, MD;  Location: [facility];  Service: Urology;  Laterality: Bilateral;  90 MINS   CYSTOSCOPY WITH RETROGRADE PYELOGRAM, URETEROSCOPY AND STENT PLACEMENT Bilateral 07/05/2020   Procedure: CYSTOSCOPY WITH RETROGRADE PYELOGRAM, URETEROSCOPY,  BIOPSIES AND STENT EXCHANGES;  Surgeon: Noel Christmas, MD;  Location: [facility];  Service: Urology;  Laterality: Bilateral;   FINGER SURGERY Right    5th   GYNECOLOGIC CRYOSURGERY  YRS AGO   IR IMAGING GUIDED PORT INSERTION  11/18/2020   PARTIAL COLECTOMY Right 05/25/2022   Procedure: EXPLORATORY LAPAROTOMY; RIGHT HEMI COLECTOMY; PARTIAL OMENTECTOMY;  Surgeon: Manus Rudd, MD;  Location: [facility];  Service: General;  Laterality: Right;   TONSILLECTOMY     chld   TRANSURETHRAL RESECTION OF BLADDER TUMOR  01/21/2021   Procedure: TRANSURETHRAL RESECTION OF BLADDER TUMOR (TURBT);  Surgeon: Noel Christmas, MD;  Location:  [facility];  Service: Urology;;    Allergies: Penicillins and Metformin  Medications: Prior to Admission medications   Medication Sig Start Date End Date Taking? Authorizing Provider  albuterol (PROAIR HFA) 108 (90 Base) MCG/ACT inhaler INHALE 1 TO 2 PUFFS BY MOUTH INTO THE LUNGS EVERY 4 HOURS AS NEEDED FOR WHEEZING OR SHORTNESS OF BREATH 02/16/23   Christen Butter, NP  ARIPiprazole (ABILIFY) 10 MG tablet TAKE 1 TABLET AT BEDTIME 02/19/23   Christen Butter, NP  buPROPion (WELLBUTRIN XL) 300 MG 24 hr tablet Take 1 tablet (300 mg total) by mouth daily. 04/01/23   Christen Butter, NP  celecoxib (CELEBREX) 100 MG capsule Take 1-2 capsules (100-200 mg total) by mouth daily as needed for pain. 04/12/23   Monica Becton, MD  cephALEXin (KEFLEX) 500 MG capsule Take 1 capsule (500 mg total) by mouth 3 (three) times daily. 03/25/23   Christen Butter, NP  clonazePAM (KLONOPIN) 1 MG tablet Take 1 tablet (1 mg total) by mouth at bedtime. 03/25/23   Christen Butter, NP  fexofenadine (ALLEGRA) 180 MG tablet Take 1 tablet (180 mg total) by mouth daily. 07/06/22   Christen Butter, NP  gabapentin (NEURONTIN) 300 MG  capsule TAKE 1 CAPSULE DAILY AND 4 CAPSULES AT BEDTIME AS DIRECTED. 02/24/23   Christen Butter, NP  HYDROcodone-acetaminophen (NORCO) 10-325 MG tablet Take 1 tablet by mouth 2 (two) times daily as needed. 06/01/23   Monica Becton, MD  lidocaine-prilocaine (EMLA) cream Apply 1 application topically as needed. Patient taking differently: Apply 1 application  topically as needed (for port access). 10/16/21   Benjiman Core, MD  Meth-Hyo-M Bl-Na Phos-Ph Sal (URIBEL) 118 MG CAPS Take one capsule by mouth 3 times daily Patient taking differently: as needed (spasms). 06/06/21     nebivolol (BYSTOLIC) 10 MG tablet TAKE 1 TABLET EVERY DAY 04/16/23   Christen Butter, NP  ondansetron (ZOFRAN) 4 MG tablet Take 1 tablet (4 mg total) by mouth every 8 (eight) hours as needed for nausea or vomiting. 04/30/21   Benjiman Core, MD   oxybutynin (DITROPAN) 5 MG tablet Take 1 tablet (5 mg total) by mouth every 8 (eight) hours as needed. Patient taking differently: Take 5 mg by mouth every 8 (eight) hours as needed for bladder spasms. 10/06/22 10/06/23    phentermine 37.5 MG capsule Take 37.5 mg by mouth every morning.    [provider]  predniSONE (DELTASONE) 50 MG tablet Take 1 tablet (50 mg total) by mouth daily. 05/25/23   Monica Becton, MD  sertraline (ZOLOFT) 25 MG tablet TAKE 1 TABLET EVERY DAY 04/16/23   Christen Butter, NP  simvastatin (ZOCOR) 20 MG tablet Take 1 tablet (20 mg total) by mouth daily. Needs lab work. 04/01/23   Christen Butter, NP  tamsulosin (FLOMAX) 0.4 MG CAPS capsule Take 1 capsule (0.4 mg total) by mouth at bedtime. 09/22/22        Family History  Problem Relation Age of Onset   Ovarian cancer Mother    Cardiomyopathy Father    Valvular heart disease Father    Liver cancer Brother    Protein C deficiency Brother    Protein C deficiency Brother    Stroke Brother     Social History   Socioeconomic History   Marital status: Married    Spouse name: Not on file   Number of children: Not on file   Years of education: Not on file   Highest education level: Not on file  Occupational History   Not on file  Tobacco Use   Smoking status: Former    Packs/day: 1.00    Years: 25.00    Additional pack years: 0.00    Total pack years: 25.00    Types: Cigarettes    Quit date: 2012    Years since quitting: 12.4   Smokeless tobacco: Never  Vaping Use   Vaping Use: Every day   Substances: Nicotine   Devices: unsure name  Substance and Sexual Activity   Alcohol use: Yes    Comment: occasional beer   Drug use: Never   Sexual activity: Not on file  Other Topics Concern   Not on file  Social History Narrative   Not on file   Social Determinants of Health   Financial Resource Strain: Medium Risk (06/06/2021)   Overall Financial Resource Strain (CARDIA)    Difficulty of Paying  Living Expenses: Somewhat hard  Food Insecurity: No Food Insecurity (06/06/2021)   Hunger Vital Sign    Worried About Running Out of Food in the Last Year: Never true    Ran Out of Food in the Last Year: Never true  Transportation Needs: No Transportation Needs (06/06/2021)  PRAPARE - Administrator, Civil Service (Medical): No    Lack of Transportation (Non-Medical): No  Physical Activity: Not on file  Stress: No Stress Concern Present (06/06/2021)   [employer] of Occupational Health - Occupational Stress Questionnaire    Feeling of Stress : Not at all  Social Connections: Moderately Integrated (06/06/2021)   Social Connection and Isolation Panel [NHANES]    Frequency of Communication with Friends and Family: More than three times a week    Frequency of Social Gatherings with Friends and Family: Never    Attends Religious Services: Never    Database administrator or Organizations: No    Attends Engineer, structural: 1 to 4 times per year    Marital Status: Married    Review of Systems: A 12 point ROS discussed and pertinent positives are indicated in the HPI above.  All other systems are negative.   Vital Signs: There were no vitals taken for this visit.  Advance Care Plan: The advanced care plan/surrogate decision maker was discussed at the time of visit and documented in the medical record.    No physical exam is performed in lieu of virtual telephone visit.    Imaging: Left knee 11/27/20  Kellgren and Lyman Bishop grade 3 (Moderate)  Labs:  CBC: Recent Labs    09/23/22 1022 10/14/22 0856 11/04/22 0954 11/25/22 1124  WBC 6.3 7.5 7.3 6.8  HGB 11.9* 11.5* 12.3 11.3*  HCT 34.4* 33.8* 36.1 33.3*  PLT 142* 172 186 195    COAGS: No results for input(s): "INR", "APTT" in the last 8760 hours.  BMP: Recent Labs    09/23/22 1022 10/14/22 0856 11/04/22 0954 11/25/22 1124  NA 128* 132* 131* 130*  K 3.9 4.3 4.3 4.6  CL 97* 99 99 99  CO2 26  27 23 26   GLUCOSE 94 93 91 82  BUN 12 15 20 16   CALCIUM 8.9 9.0 9.1 9.6  CREATININE 1.12* 1.03* 1.21* 1.04*  GFRNONAA 55* >60 50* 60*    LIVER FUNCTION TESTS: Recent Labs    09/23/22 1022 10/14/22 0856 11/04/22 0954 11/25/22 1124  BILITOT 0.3 0.2* 0.7 0.4  AST 18 17 33 18  ALT 14 15 27 13   ALKPHOS 74 74 74 71  PROT 7.4 7.1 7.9 7.2  ALBUMIN 4.3 4.1 4.4 4.2    TUMOR MARKERS: No results for input(s): "AFPTM", "CEA", "CA199", "CHROMGRNA" in the last 8760 hours.  Assessment and Plan:  67 year old female with history of severe left knee pain (WOMAC 46/96) secondary to osteoarthritis (K&G grade 3) with lifestyle limiting pain and discomfort refractory to conservative measures including sustained release steroid injections and hyaluronic acid injections. She has advanced stage bladder cancer and wishes to pursue minimally invasive options to alleviate her knee pain.  She is an excellent candidate for geniculate artery embolization.  Plan for left geniculate artery embolization with moderate sedation at [facility].  Plan for antegrade left femoral artery access.   Marliss Coots, MD Pager: [phone]    I spent a total of 40 Minutes  in virtual telephone clinical consultation, greater than 50% of which was counseling/coordinating care for left knee pain

## 2023-06-09 ENCOUNTER — Ambulatory Visit
Admission: RE | Admit: 2023-06-09 | Discharge: 2023-06-09 | Disposition: A | Discharge status: Home / Self Care | Payer: Medicare HMO | Source: Ambulatory Visit | Attending: Sports Medicine | Admitting: Sports Medicine

## 2023-06-09 DIAGNOSIS — M170 Bilateral primary osteoarthritis of knee: Secondary | ICD-10-CM

## 2023-06-09 HISTORY — PX: IR RADIOLOGIST EVAL & MGMT: IMG5224

## 2023-06-10 ENCOUNTER — Other Ambulatory Visit (HOSPITAL_COMMUNITY): Payer: Self-pay | Admitting: Interventional Radiology

## 2023-06-10 DIAGNOSIS — M1712 Unilateral primary osteoarthritis, left knee: Secondary | ICD-10-CM

## 2023-06-14 ENCOUNTER — Encounter: Payer: Self-pay | Admitting: Sports Medicine

## 2023-06-19 DIAGNOSIS — A499 Bacterial infection, unspecified: Secondary | ICD-10-CM | POA: Diagnosis not present

## 2023-06-19 DIAGNOSIS — R300 Dysuria: Secondary | ICD-10-CM | POA: Diagnosis not present

## 2023-06-19 DIAGNOSIS — N390 Urinary tract infection, site not specified: Secondary | ICD-10-CM | POA: Diagnosis not present

## 2023-06-21 ENCOUNTER — Other Ambulatory Visit: Payer: Self-pay | Admitting: Medical-Surgical

## 2023-06-21 DIAGNOSIS — F410 Panic disorder [episodic paroxysmal anxiety] without agoraphobia: Secondary | ICD-10-CM

## 2023-06-21 DIAGNOSIS — R4589 Other symptoms and signs involving emotional state: Secondary | ICD-10-CM

## 2023-06-22 ENCOUNTER — Other Ambulatory Visit: Payer: Self-pay

## 2023-06-22 ENCOUNTER — Other Ambulatory Visit (HOSPITAL_COMMUNITY): Payer: Self-pay

## 2023-06-22 MED ORDER — CLONAZEPAM 1 MG PO TABS
1.0000 mg | ORAL_TABLET | Freq: Every day | ORAL | 2 refills | Status: DC
Start: 2023-06-22 — End: 2023-07-01
  Filled 2023-06-22 – 2023-06-29 (×2): qty 30, 30d supply, fill #0

## 2023-06-23 ENCOUNTER — Other Ambulatory Visit: Payer: Self-pay

## 2023-06-23 DIAGNOSIS — Z5112 Encounter for antineoplastic immunotherapy: Secondary | ICD-10-CM | POA: Diagnosis not present

## 2023-06-23 DIAGNOSIS — C9101 Acute lymphoblastic leukemia, in remission: Secondary | ICD-10-CM | POA: Diagnosis not present

## 2023-06-23 DIAGNOSIS — C679 Malignant neoplasm of bladder, unspecified: Secondary | ICD-10-CM | POA: Diagnosis not present

## 2023-06-23 DIAGNOSIS — C678 Malignant neoplasm of overlapping sites of bladder: Secondary | ICD-10-CM | POA: Diagnosis not present

## 2023-06-23 DIAGNOSIS — Z79899 Other long term (current) drug therapy: Secondary | ICD-10-CM | POA: Diagnosis not present

## 2023-06-24 ENCOUNTER — Other Ambulatory Visit (HOSPITAL_COMMUNITY): Payer: Self-pay

## 2023-06-25 DIAGNOSIS — C679 Malignant neoplasm of bladder, unspecified: Secondary | ICD-10-CM | POA: Diagnosis not present

## 2023-06-28 ENCOUNTER — Other Ambulatory Visit: Payer: Self-pay

## 2023-06-29 ENCOUNTER — Other Ambulatory Visit: Payer: Self-pay

## 2023-06-29 ENCOUNTER — Other Ambulatory Visit (HOSPITAL_BASED_OUTPATIENT_CLINIC_OR_DEPARTMENT_OTHER): Payer: Self-pay

## 2023-06-29 ENCOUNTER — Other Ambulatory Visit (HOSPITAL_COMMUNITY): Payer: Self-pay

## 2023-07-01 ENCOUNTER — Ambulatory Visit: Payer: Medicare HMO | Admitting: Medical-Surgical

## 2023-07-01 ENCOUNTER — Encounter: Payer: Self-pay | Admitting: Medical-Surgical

## 2023-07-01 VITALS — BP 107/66 | HR 76 | Resp 20 | Ht 66.0 in | Wt 134.4 lb

## 2023-07-01 DIAGNOSIS — R300 Dysuria: Secondary | ICD-10-CM

## 2023-07-01 DIAGNOSIS — F410 Panic disorder [episodic paroxysmal anxiety] without agoraphobia: Secondary | ICD-10-CM | POA: Diagnosis not present

## 2023-07-01 DIAGNOSIS — R4589 Other symptoms and signs involving emotional state: Secondary | ICD-10-CM

## 2023-07-01 LAB — POCT URINALYSIS DIP (CLINITEK)
Bilirubin, UA: NEGATIVE
Glucose, UA: NEGATIVE mg/dL
Ketones, POC UA: NEGATIVE mg/dL
Nitrite, UA: POSITIVE — AB
POC PROTEIN,UA: 30 — AB
Spec Grav, UA: 1.01 (ref 1.010–1.025)
Urobilinogen, UA: 0.2 E.U./dL
pH, UA: 7 (ref 5.0–8.0)

## 2023-07-01 MED ORDER — PHENAZOPYRIDINE HCL 200 MG PO TABS: 200.0000 mg | ORAL_TABLET | Freq: Three times a day (TID) | ORAL | 0 refills | Status: DC | PRN

## 2023-07-01 MED ORDER — CLONAZEPAM 1 MG PO TABS
ORAL_TABLET | ORAL | 1 refills | Status: DC
Start: 2023-07-01 — End: 2023-12-28

## 2023-07-01 MED ORDER — NITROFURANTOIN MONOHYD MACRO 100 MG PO CAPS: 100.0000 mg | ORAL_CAPSULE | Freq: Two times a day (BID) | ORAL | 0 refills | Status: DC

## 2023-07-01 NOTE — Progress Notes (Signed)
        Established patient visit  History, exam, impression, and plan:  1. Dysuria Pleasant 67 year old female presenting today for evaluation of urinary symptoms that has been present for approximately 1 month.  She did see someone at urgent care last month who prescribed her Keflex.  She completed this however her symptoms did not fully resolve and have returned in full force.  She is experiencing cloudy urine with foul odor, frequency, hesitancy, difficulty emptying her bladder fully, pain at the end of urination, and bladder spasms.  Denies any burning with urination, fever, chills, nausea, vomiting, and flank pain.  Has been using Azo at home but no other treatments.  She has bilateral stents in place.  POCT urinalysis positive for moderate blood, nitrites, and large amount of leukocytes.  Small protein also present.  Sending for culture.  Will go ahead and treat with Macrobid 100 mg twice daily for 5 days.  Patient has been advised that if culture comes back showing resistance to Macrobid, we will be switching depending on the susceptibility.  Attempted to send radioman however her insurance will no other cover this.  She will continue with Azo on an as-needed basis. - POCT URINALYSIS DIP (CLINITEK) - Urine Culture  2. Panic attack 3. Anxiety about health Has multiple health issues that require frequent follow-ups and have an unsure prognosis.  She has been undergoing chemotherapy and has had quite a few struggles with insurance coverage and medication use.  Understandably, has significant anxiety regarding her health and what the future holds.  Occasionally has panic attacks and has tremendous difficulty sleeping at night.  Has been using clonazepam 1 mg nightly to help with sleep.  She is aware that this is not recommended due to the risk of tolerance and dependence however is unable to sleep without it.  Had an old prescription of 0.5 mg tablets that she has kept at home.  With the recent  challenges getting her chemotherapy medications covered and the upcoming evaluations that may determine continued coverage, she has been using 0.5 mg daily as needed in addition to the 1 mg nightly.  Feels that this works well for her but is now out of her old prescription.  She is requesting a refill of this to use daily as needed with the continuation of her 1 mg nightly dose.  After discussion and review of recommendations, feel that this is an appropriate situation for continued use of clonazepam.  Increasing her prescription to cover 0.5 mg daily as needed as well as her 1 mg nightly dose. - clonazePAM (KLONOPIN) 1 MG tablet; May take 0.5 tablets (0.5 mg total) by mouth daily as needed. May also take 1 tablet (1 mg total) at bedtime as needed.  Dispense: 135 tablet; Refill: 1  Procedures performed this visit: None.  Follow-up as scheduled in September.  __________________________________ Thayer Ohm, DNP, APRN, FNP-BC Primary Care and Sports Medicine [facility] [place]

## 2023-07-03 LAB — URINE CULTURE
MICRO NUMBER:: 15217752
SPECIMEN QUALITY:: ADEQUATE

## 2023-07-07 ENCOUNTER — Other Ambulatory Visit: Payer: Self-pay | Admitting: Radiology

## 2023-07-07 DIAGNOSIS — M1712 Unilateral primary osteoarthritis, left knee: Secondary | ICD-10-CM

## 2023-07-07 NOTE — H&P (Signed)
Referring Physician(s): Thekkekandam,T  Supervising Physician: Marliss Coots  Patient Status:  [facility]  Chief Complaint:  Left knee pain  Subjective: Patient known to IR team from left retroperitoneal lymph node biopsy on 10/24/2020 , Port-A-Cath placement on 11/18/2020 and consultation with Dr. Elby Showers on 06/09/2023 to discuss treatment options for persistent left knee pain.  She is a 67 year old female with past medical history significant for prior tobacco/alcohol abuse, anemia, chronic kidney disease, diverticulosis, generalized anxiety disorder, GERD, hepatitis C, nephrolithiasis, skin cancer, hyperlipidemia, hypertension, depression, osteoarthritis, vitamin D deficiency, hydronephrosis with prior bilateral stents in 2021 as well as advanced stage bladder cancer.  She has a history of bilateral knee osteoarthritis and has had steroid injections and the Orthovisc series which she finished a few months ago.  The right knee did well but the left knee continues to hurt and she is not interested in arthroplasty.  She was referred for genicular RFA  but was denied by insurance.  Following discussions with Dr. Elby Showers she was deemed an appropriate candidate for left genicular artery embolization and presents today for the procedure.  She was diagnosed with an enterococcal UTI on 7/18 treated with Macrobid. She denies fever, HA,CP,dyspnea, cough, abd/back pain,N/V or bleeding; does have left > right knee pain.    Past Medical History:  Diagnosis Date   Adenocarcinoma of bladder, stage 4 (HCC) 10/2020   oncologist--- dr Clelia Croft and urologist-- dr pace;  w/ mets , started chemo 12/ 2021 and bilateral ureteral stents to treat hydronephrosis   Alcohol dependence (HCC)    drank heavily years ago doesn't now 10/22/2021   Anemia due to chemotherapy    Bilateral hydronephrosis    urologsit--- dr pace;   chronic issue,  treated with ureteral stents   CKD (chronic kidney disease), stage III (HCC)     Diverticulosis of colon    Encounter for antineoplastic chemotherapy    Family history of adverse reaction to anesthesia    father had a "tight" airway   GAD (generalized anxiety disorder)    GERD (gastroesophageal reflux disease)    History of 2019 novel coronavirus disease (COVID-19) 08/13/2020   positive result in epic 08-19-2020,  hospital admission in epic,  covid pneumonia with hypoxia respiratory failure, discharged with oxygen for 6 weeks   History of adenomatous polyp of colon    History of chemotherapy last done 05-13-2021   History of colitis 03/2018   acute colitis with sepsis   History of hepatitis C    completed treatment in 2012   History of kidney stones    History of rheumatic fever as a child    per pt no valvular issues   History of sepsis    11/ 2021hospital admission in epic due to pyelonephritis due to hydronephrosis, resolved//   urosepsis 2022   History of skin cancer    excision from nose   History of small bowel obstruction 05/25/2022   s/p  right hemicolectomy   Hyperlipidemia    Hypertension    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HCC)    MDD (major depressive disorder)    Metastasis from bladder cancer (HCC)    Neoplastic (malignant) related fatigue    OA (osteoarthritis)    Back and lt knee   Palliative care patient    Port-A-Cath in place 11/18/2020   Seasonal allergies    Vitamin D deficiency    Wears glasses    Past Surgical History:  Procedure Laterality Date   ANAL RECTAL MANOMETRY N/A  01/31/2020   Procedure: ANO RECTAL MANOMETRY;  Surgeon: Willis Modena, MD;  Location: [facility];  Service: Endoscopy;  Laterality: N/A;   CESAREAN SECTION  1984   COLONOSCOPY WITH PROPOFOL  05/07/2022   [facility]--- dr outlaw   CYSTOSCOPY W/ URETERAL STENT PLACEMENT Bilateral 01/21/2021   Procedure: CYSTOSCOPY WITH STENT REPLACEMENT;  Surgeon: Noel Christmas, MD;  Location: [facility];  Service: Urology;  Laterality: Bilateral;  1 HR    CYSTOSCOPY W/ URETERAL STENT PLACEMENT Bilateral 05/20/2021   Procedure: CYSTOSCOPY WITH RETROGRADE PYELOGRAM/URETERAL STENT EXCHANGE;  Surgeon: Noel Christmas, MD;  Location: [facility];  Service: Urology;  Laterality: Bilateral;   CYSTOSCOPY W/ URETERAL STENT PLACEMENT Bilateral 10/28/2021   Procedure: CYSTOSCOPY WITH RETROGRADE PYELOGRAM/URETERAL STENT EXCHANGE;  Surgeon: Noel Christmas, MD;  Location: [facility];  Service: Urology;  Laterality: Bilateral;   CYSTOSCOPY W/ URETERAL STENT PLACEMENT Bilateral 04/28/2022   Procedure: CYSTOSCOPY Otilio Miu STENT EXCHANGE;  Surgeon: Noel Christmas, MD;  Location: [facility];  Service: Urology;  Laterality: Bilateral;  1 HR   CYSTOSCOPY W/ URETERAL STENT PLACEMENT Bilateral 11/17/2022   Procedure: CYSTOSCOPY WITH RETROGRADE PYELOGRAM/URETERAL STENT REPLACEMENT;  Surgeon: Noel Christmas, MD;  Location: [facility];  Service: Urology;  Laterality: Bilateral;   CYSTOSCOPY WITH RETROGRADE PYELOGRAM, URETEROSCOPY AND STENT PLACEMENT Bilateral 06/11/2020   Procedure: CYSTOSCOPY WITH RETROGRADE PYELOGRAM, URETEROSCOPY AND STENT PLACEMENT, RIGHT URETERAL BIOPSY;  Surgeon: Noel Christmas, MD;  Location: [facility];  Service: Urology;  Laterality: Bilateral;  90 MINS   CYSTOSCOPY WITH RETROGRADE PYELOGRAM, URETEROSCOPY AND STENT PLACEMENT Bilateral 07/05/2020   Procedure: CYSTOSCOPY WITH RETROGRADE PYELOGRAM, URETEROSCOPY,  BIOPSIES AND STENT EXCHANGES;  Surgeon: Noel Christmas, MD;  Location: [facility];  Service: Urology;  Laterality: Bilateral;   FINGER SURGERY Right    5th   GYNECOLOGIC CRYOSURGERY  YRS AGO   IR IMAGING GUIDED PORT INSERTION  11/18/2020   IR RADIOLOGIST EVAL & MGMT  06/09/2023   PARTIAL COLECTOMY Right 05/25/2022   Procedure: EXPLORATORY LAPAROTOMY; RIGHT HEMI COLECTOMY; PARTIAL OMENTECTOMY;  Surgeon: Manus Rudd, MD;  Location: [facility];  Service:  General;  Laterality: Right;   TONSILLECTOMY     chld   TRANSURETHRAL RESECTION OF BLADDER TUMOR  01/21/2021   Procedure: TRANSURETHRAL RESECTION OF BLADDER TUMOR (TURBT);  Surgeon: Noel Christmas, MD;  Location: [facility];  Service: Urology;;      Allergies: Penicillins and Metformin  Medications: Prior to Admission medications   Medication Sig Start Date End Date Taking? Authorizing Provider  albuterol (PROAIR HFA) 108 (90 Base) MCG/ACT inhaler INHALE 1 TO 2 PUFFS BY MOUTH INTO THE LUNGS EVERY 4 HOURS AS NEEDED FOR WHEEZING OR SHORTNESS OF BREATH 02/16/23   Christen Butter, NP  ARIPiprazole (ABILIFY) 10 MG tablet TAKE 1 TABLET AT BEDTIME 02/19/23   Christen Butter, NP  buPROPion (WELLBUTRIN XL) 300 MG 24 hr tablet Take 1 tablet (300 mg total) by mouth daily. 04/01/23   Christen Butter, NP  celecoxib (CELEBREX) 100 MG capsule Take 1-2 capsules (100-200 mg total) by mouth daily as needed for pain. 04/12/23   Monica Becton, MD  cephALEXin (KEFLEX) 500 MG capsule Take 1 capsule (500 mg total) by mouth 3 (three) times daily. 03/25/23   Christen Butter, NP  clonazePAM Scarlette Calico) 1 MG tablet May take 0.5 tablets (0.5 mg total) by mouth daily as needed. May also take 1 tablet (1 mg total) at bedtime as  needed. 07/01/23   Christen Butter, NP  fexofenadine (ALLEGRA) 180 MG tablet Take 1 tablet (180 mg total) by mouth daily. 07/06/22   Christen Butter, NP  gabapentin (NEURONTIN) 300 MG capsule TAKE 1 CAPSULE DAILY AND 4 CAPSULES AT BEDTIME AS DIRECTED. 02/24/23   Christen Butter, NP  HYDROcodone-acetaminophen (NORCO) 10-325 MG tablet Take 1 tablet by mouth 2 (two) times daily as needed. 06/01/23   Monica Becton, MD  lidocaine-prilocaine (EMLA) cream Apply 1 application topically as needed. Patient taking differently: Apply 1 application  topically as needed (for port access). 10/16/21   Benjiman Core, MD  Meth-Hyo-M Bl-Na Phos-Ph Sal (URIBEL) 118 MG CAPS Take one capsule by mouth 3 times  daily Patient taking differently: as needed (spasms). 06/06/21     MOUNJARO 5 MG/0.5ML Pen Inject into the skin. 05/14/23   [provider]  nebivolol (BYSTOLIC) 10 MG tablet TAKE 1 TABLET EVERY DAY 04/16/23   Christen Butter, NP  nitrofurantoin, macrocrystal-monohydrate, (MACROBID) 100 MG capsule Take 1 capsule (100 mg total) by mouth 2 (two) times daily. 07/01/23   Christen Butter, NP  ondansetron (ZOFRAN) 4 MG tablet Take 1 tablet (4 mg total) by mouth every 8 (eight) hours as needed for nausea or vomiting. 04/30/21   Benjiman Core, MD  oxybutynin (DITROPAN) 5 MG tablet Take 1 tablet (5 mg total) by mouth every 8 (eight) hours as needed. Patient taking differently: Take 5 mg by mouth every 8 (eight) hours as needed for bladder spasms. 10/06/22 10/06/23    oxyCODONE-acetaminophen (PERCOCET/ROXICET) 5-325 MG tablet Take by mouth. 06/04/23   [provider]  phentermine 37.5 MG capsule Take 37.5 mg by mouth every morning.    [provider]  sertraline (ZOLOFT) 25 MG tablet TAKE 1 TABLET EVERY DAY 04/16/23   Christen Butter, NP  simvastatin (ZOCOR) 20 MG tablet Take 1 tablet (20 mg total) by mouth daily. Needs lab work. 04/01/23   Christen Butter, NP  sodium bicarbonate 650 MG tablet Take by mouth. 06/02/23 11/29/23  [provider]  tamsulosin (FLOMAX) 0.4 MG CAPS capsule Take 1 capsule (0.4 mg total) by mouth at bedtime. 09/22/22        Vital Signs: Vitals:   07/08/23 0818  BP: 115/77  Pulse: 70  Resp: 16  Temp: 97.6 F (36.4 C)  SpO2: 97%      Code Status: FULL CODE  Physical Exam: awake/alert; chest- few basilar crackles on right , clear on left; intact right chest wall port a cath; heart- RRR; abd- soft,+BS,NT; no LE edema  Imaging: No results found.  Labs:  CBC: Recent Labs    09/23/22 1022 10/14/22 0856 11/04/22 0954 11/25/22 1124  WBC 6.3 7.5 7.3 6.8  HGB 11.9* 11.5* 12.3 11.3*  HCT 34.4* 33.8* 36.1 33.3*  PLT 142* 172 186 195    COAGS: No  results for input(s): "INR", "APTT" in the last 8760 hours.  BMP: Recent Labs    09/23/22 1022 10/14/22 0856 11/04/22 0954 11/25/22 1124  NA 128* 132* 131* 130*  K 3.9 4.3 4.3 4.6  CL 97* 99 99 99  CO2 26 27 23 26   GLUCOSE 94 93 91 82  BUN 12 15 20 16   CALCIUM 8.9 9.0 9.1 9.6  CREATININE 1.12* 1.03* 1.21* 1.04*  GFRNONAA 55* >60 50* 60*    LIVER FUNCTION TESTS: Recent Labs    09/23/22 1022 10/14/22 0856 11/04/22 0954 11/25/22 1124  BILITOT 0.3 0.2* 0.7 0.4  AST 18 17 33 18  ALT 14 15 27 13   ALKPHOS 74 74 74 71  PROT 7.4 7.1 7.9 7.2  ALBUMIN 4.3 4.1 4.4 4.2    Assessment and Plan: 67 year old female with past medical history significant for prior tobacco/alcohol abuse, anemia, chronic kidney disease, diverticulosis, generalized anxiety disorder, GERD, hepatitis C, nephrolithiasis, skin cancer, hyperlipidemia, hypertension, depression, osteoarthritis, vitamin D deficiency, hydronephrosis with prior bilateral stents in 2021 as well as advanced stage bladder cancer.  She has a history of bilateral knee osteoarthritis and has had steroid injections and the Orthovisc series which she finished a few months ago.  The right knee did well but the left knee continues to hurt and she is not interested in arthroplasty.  She was referred for genicular RFA  but was denied by insurance.  Following consultation with Dr. Elby Showers on 06/09/23 she was deemed an appropriate candidate for left genicular artery embolization and presents today for the procedure.She was diagnosed with an enterococcal UTI on 7/18 treated with Macrobid. Risks and benefits of procedure were discussed with the patient/spouse  including, but not limited to bleeding, infection, vascular injury or contrast induced renal failure.  This interventional procedure involves the use of X-rays and because of the nature of the planned procedure, it is possible that we will have prolonged use of X-ray fluoroscopy.  Potential radiation  risks to you include (but are not limited to) the following: - A slightly elevated risk for cancer  several years later in life. This risk is typically less than 0.5% percent. This risk is low in comparison to the normal incidence of human cancer, which is 33% for women and 50% for men according to the [employer]. - Radiation induced injury can include skin redness, resembling a rash, tissue breakdown / ulcers and hair loss (which can be temporary or permanent).   The likelihood of either of these occurring depends on the difficulty of the procedure and whether you are sensitive to radiation due to previous procedures, disease, or genetic conditions.   IF your procedure requires a prolonged use of radiation, you will be notified and given written instructions for further action.  It is your responsibility to monitor the irradiated area for the 2 weeks following the procedure and to notify your physician if you are concerned that you have suffered a radiation induced injury.    All of the patient's questions were answered, patient is agreeable to proceed.  Consent signed and in chart.   LABS PENDING   Electronically Signed: D. Jeananne Rama, PA-C 07/07/2023, 10:47 AM   I spent a total of 25 minutes at the the patient's bedside AND on the patient's hospital floor or unit, greater than 50% of which was counseling/coordinating care for left geniculate artery embolization

## 2023-07-08 ENCOUNTER — Other Ambulatory Visit (HOSPITAL_COMMUNITY): Payer: Self-pay

## 2023-07-08 ENCOUNTER — Encounter (HOSPITAL_COMMUNITY): Payer: Self-pay

## 2023-07-08 ENCOUNTER — Other Ambulatory Visit (HOSPITAL_COMMUNITY): Payer: Self-pay | Admitting: Interventional Radiology

## 2023-07-08 ENCOUNTER — Ambulatory Visit (HOSPITAL_COMMUNITY)
Admission: RE | Admit: 2023-07-08 | Discharge: 2023-07-08 | Disposition: A | Discharge status: Home / Self Care | Payer: Medicare HMO | Source: Ambulatory Visit | Attending: Interventional Radiology | Admitting: Interventional Radiology

## 2023-07-08 ENCOUNTER — Encounter: Payer: Self-pay | Admitting: Medical-Surgical

## 2023-07-08 DIAGNOSIS — F32A Depression, unspecified: Secondary | ICD-10-CM | POA: Diagnosis not present

## 2023-07-08 DIAGNOSIS — M1712 Unilateral primary osteoarthritis, left knee: Secondary | ICD-10-CM | POA: Diagnosis not present

## 2023-07-08 DIAGNOSIS — Z8551 Personal history of malignant neoplasm of bladder: Secondary | ICD-10-CM | POA: Insufficient documentation

## 2023-07-08 DIAGNOSIS — K219 Gastro-esophageal reflux disease without esophagitis: Secondary | ICD-10-CM | POA: Diagnosis not present

## 2023-07-08 DIAGNOSIS — R8290 Unspecified abnormal findings in urine: Secondary | ICD-10-CM

## 2023-07-08 DIAGNOSIS — I129 Hypertensive chronic kidney disease with stage 1 through stage 4 chronic kidney disease, or unspecified chronic kidney disease: Secondary | ICD-10-CM | POA: Diagnosis not present

## 2023-07-08 DIAGNOSIS — N183 Chronic kidney disease, stage 3 unspecified: Secondary | ICD-10-CM | POA: Insufficient documentation

## 2023-07-08 DIAGNOSIS — D649 Anemia, unspecified: Secondary | ICD-10-CM | POA: Diagnosis not present

## 2023-07-08 DIAGNOSIS — E785 Hyperlipidemia, unspecified: Secondary | ICD-10-CM | POA: Diagnosis not present

## 2023-07-08 DIAGNOSIS — E559 Vitamin D deficiency, unspecified: Secondary | ICD-10-CM | POA: Diagnosis not present

## 2023-07-08 DIAGNOSIS — F411 Generalized anxiety disorder: Secondary | ICD-10-CM | POA: Insufficient documentation

## 2023-07-08 HISTORY — PX: IR EMBO TUMOR ORGAN ISCHEMIA INFARCT INC GUIDE ROADMAPPING: IMG5449

## 2023-07-08 HISTORY — PX: IR EMBO ARTERIAL NOT HEMORR HEMANG INC GUIDE ROADMAPPING: IMG5448

## 2023-07-08 HISTORY — PX: IR 3D INDEPENDENT WKST: IMG2385

## 2023-07-08 HISTORY — PX: IR ANGIOGRAM EXTREMITY LEFT: IMG651

## 2023-07-08 HISTORY — PX: IR US GUIDE VASC ACCESS LEFT: IMG2389

## 2023-07-08 HISTORY — PX: IR ANGIOGRAM SELECTIVE EACH ADDITIONAL VESSEL: IMG667

## 2023-07-08 LAB — CBC WITH DIFFERENTIAL/PLATELET
Abs Immature Granulocytes: 0.02 10*3/uL (ref 0.00–0.07)
Basophils Absolute: 0 10*3/uL (ref 0.0–0.1)
Basophils Relative: 1 %
Eosinophils Absolute: 0.4 10*3/uL (ref 0.0–0.5)
Eosinophils Relative: 7 %
HCT: 31.9 % — ABNORMAL LOW (ref 36.0–46.0)
Hemoglobin: 10.1 g/dL — ABNORMAL LOW (ref 12.0–15.0)
Immature Granulocytes: 0 %
Lymphocytes Relative: 29 %
Lymphs Abs: 1.6 10*3/uL (ref 0.7–4.0)
MCH: 30.6 pg (ref 26.0–34.0)
MCHC: 31.7 g/dL (ref 30.0–36.0)
MCV: 96.7 fL (ref 80.0–100.0)
Monocytes Absolute: 0.7 10*3/uL (ref 0.1–1.0)
Monocytes Relative: 12 %
Neutro Abs: 2.9 10*3/uL (ref 1.7–7.7)
Neutrophils Relative %: 51 %
Platelets: 184 10*3/uL (ref 150–400)
RBC: 3.3 MIL/uL — ABNORMAL LOW (ref 3.87–5.11)
RDW: 13.9 % (ref 11.5–15.5)
WBC: 5.6 10*3/uL (ref 4.0–10.5)
nRBC: 0 % (ref 0.0–0.2)

## 2023-07-08 LAB — PROTIME-INR
INR: 1.1 (ref 0.8–1.2)
Prothrombin Time: 14 seconds (ref 11.4–15.2)

## 2023-07-08 LAB — BASIC METABOLIC PANEL
Anion gap: 10 (ref 5–15)
BUN: 14 mg/dL (ref 8–23)
CO2: 22 mmol/L (ref 22–32)
Calcium: 8.9 mg/dL (ref 8.9–10.3)
Chloride: 98 mmol/L (ref 98–111)
Creatinine, Ser: 1.1 mg/dL — ABNORMAL HIGH (ref 0.44–1.00)
GFR, Estimated: 56 mL/min — ABNORMAL LOW (ref >60)
Glucose, Bld: 80 mg/dL (ref 70–99)
Potassium: 4 mmol/L (ref 3.5–5.1)
Sodium: 130 mmol/L — ABNORMAL LOW (ref 135–145)

## 2023-07-08 MED ORDER — ACETAMINOPHEN 10 MG/ML IV SOLN
1000.0000 mg | Freq: Once | INTRAVENOUS | Status: AC
  Administered 2023-07-08: 1000 mg via INTRAVENOUS
  Filled 2023-07-08: qty 100

## 2023-07-08 MED ORDER — FENTANYL CITRATE (PF) 100 MCG/2ML IJ SOLN
INTRAMUSCULAR | Status: AC
  Filled 2023-07-08: qty 2

## 2023-07-08 MED ORDER — IODIXANOL 320 MG/ML IV SOLN
50.0000 mL | Freq: Once | INTRAVENOUS | Status: AC | PRN
  Administered 2023-07-08: 10 mL via INTRA_ARTERIAL

## 2023-07-08 MED ORDER — MIDAZOLAM HCL 2 MG/2ML IJ SOLN
INTRAMUSCULAR | Status: AC | PRN
  Administered 2023-07-08 (×4): 1 mg via INTRAVENOUS

## 2023-07-08 MED ORDER — IODIXANOL 320 MG/ML IV SOLN
50.0000 mL | Freq: Once | INTRAVENOUS | Status: AC | PRN
  Administered 2023-07-08: 5 mL via INTRA_ARTERIAL

## 2023-07-08 MED ORDER — NITROGLYCERIN 1 MG/10 ML FOR IR/CATH LAB
INTRA_ARTERIAL | Status: AC | PRN
  Administered 2023-07-08: 100 ug via INTRA_ARTERIAL

## 2023-07-08 MED ORDER — LIDOCAINE HCL (PF) 1 % IJ SOLN: 20.0000 mL | Freq: Once | INTRAMUSCULAR | Status: DC

## 2023-07-08 MED ORDER — KETOROLAC TROMETHAMINE 30 MG/ML IJ SOLN
30.0000 mg | INTRAMUSCULAR | Status: AC
  Administered 2023-07-08: 30 mg via INTRAVENOUS
  Filled 2023-07-08: qty 1

## 2023-07-08 MED ORDER — DEXAMETHASONE SODIUM PHOSPHATE 10 MG/ML IJ SOLN: 20.0000 mg | Freq: Once | INTRAMUSCULAR | Status: DC

## 2023-07-08 MED ORDER — NAPROXEN 500 MG PO TABS
500.0000 mg | ORAL_TABLET | Freq: Two times a day (BID) | ORAL | 0 refills | Status: DC
  Filled 2023-07-08 (×2): qty 10, 5d supply, fill #0

## 2023-07-08 MED ORDER — LIDOCAINE HCL 1 % IJ SOLN
INTRAMUSCULAR | Status: AC
  Filled 2023-07-08: qty 20

## 2023-07-08 MED ORDER — LIDOCAINE HCL 1 % IJ SOLN
7.0000 mL | Freq: Once | INTRAMUSCULAR | Status: AC
  Administered 2023-07-08: 7 mL via INTRADERMAL

## 2023-07-08 MED ORDER — METHYLPREDNISOLONE 4 MG PO TBPK
ORAL_TABLET | ORAL | 0 refills | Status: DC
  Filled 2023-07-08 (×2): qty 21, 6d supply, fill #0

## 2023-07-08 MED ORDER — MIDAZOLAM HCL 2 MG/2ML IJ SOLN
INTRAMUSCULAR | Status: AC
  Filled 2023-07-08: qty 2

## 2023-07-08 MED ORDER — DEXAMETHASONE SODIUM PHOSPHATE 10 MG/ML IJ SOLN
10.0000 mg | Freq: Once | INTRAMUSCULAR | Status: DC
  Filled 2023-07-08: qty 1

## 2023-07-08 MED ORDER — HEPARIN SOD (PORK) LOCK FLUSH 100 UNIT/ML IV SOLN
INTRAVENOUS | Status: AC
  Administered 2023-07-08: 500 [IU]
  Filled 2023-07-08: qty 5

## 2023-07-08 MED ORDER — FENTANYL CITRATE (PF) 100 MCG/2ML IJ SOLN
INTRAMUSCULAR | Status: AC | PRN
  Administered 2023-07-08 (×4): 50 ug via INTRAVENOUS

## 2023-07-08 MED ORDER — SODIUM CHLORIDE 0.9 % IV SOLN
20.0000 mg | Freq: Once | INTRAVENOUS | Status: AC
  Administered 2023-07-08: 20 mg via INTRAVENOUS
  Filled 2023-07-08: qty 2

## 2023-07-08 MED ORDER — SODIUM CHLORIDE 0.9 % IV SOLN: INTRAVENOUS | Status: DC

## 2023-07-08 MED ORDER — NITROGLYCERIN IN D5W 100-5 MCG/ML-% IV SOLN
INTRAVENOUS | Status: AC
  Filled 2023-07-08: qty 250

## 2023-07-08 NOTE — Procedures (Signed)
Interventional Radiology Procedure Note  Procedure: Left geniculate artery embolization  Findings: Please refer to procedural dictation for full description. 0.5 mL 100-300 micron embospheres to articular branch of left descending geniculate artery.   6 Fr left CFA angioseal closure.  Complications: None immediate  Estimated Blood Loss:  <5 mL  Recommendations: 1 hour bedrest Medrol dosepak 500 mg Naproxen BID for 5 days, ice pack PRN   Marliss Coots, MD Pager: [phone]

## 2023-07-08 NOTE — Sedation Documentation (Signed)
Sheath removed

## 2023-07-08 NOTE — Sedation Documentation (Signed)
AngioSeal deployed successfully

## 2023-07-12 ENCOUNTER — Ambulatory Visit: Payer: Medicare HMO | Admitting: Medical-Surgical

## 2023-07-12 DIAGNOSIS — R8290 Unspecified abnormal findings in urine: Secondary | ICD-10-CM | POA: Diagnosis not present

## 2023-07-12 LAB — POCT URINALYSIS DIP (CLINITEK)
Bilirubin, UA: NEGATIVE
Glucose, UA: NEGATIVE mg/dL
Ketones, POC UA: NEGATIVE mg/dL
Nitrite, UA: NEGATIVE
POC PROTEIN,UA: 30 — AB
Spec Grav, UA: 1.01 (ref 1.010–1.025)
Urobilinogen, UA: 0.2 E.U./dL
pH, UA: 7 (ref 5.0–8.0)

## 2023-07-12 NOTE — Progress Notes (Signed)
POCT urinalysis positive for large leukocytes and moderate blood.  Negative for nitrites.  Sending for culture for further evaluation.  ___________________________________________ Thayer Ohm, DNP, APRN, FNP-BC Primary Care and Sports Medicine [facility] [place]

## 2023-07-14 ENCOUNTER — Other Ambulatory Visit: Payer: Self-pay

## 2023-07-14 ENCOUNTER — Other Ambulatory Visit (HOSPITAL_COMMUNITY): Payer: Self-pay

## 2023-07-14 ENCOUNTER — Other Ambulatory Visit: Payer: Self-pay | Admitting: Medical-Surgical

## 2023-07-14 DIAGNOSIS — I959 Hypotension, unspecified: Secondary | ICD-10-CM | POA: Diagnosis not present

## 2023-07-14 DIAGNOSIS — R820 Chyluria: Secondary | ICD-10-CM | POA: Diagnosis not present

## 2023-07-14 DIAGNOSIS — C679 Malignant neoplasm of bladder, unspecified: Secondary | ICD-10-CM | POA: Diagnosis not present

## 2023-07-14 DIAGNOSIS — J432 Centrilobular emphysema: Secondary | ICD-10-CM | POA: Diagnosis not present

## 2023-07-14 DIAGNOSIS — N1330 Unspecified hydronephrosis: Secondary | ICD-10-CM | POA: Diagnosis not present

## 2023-07-14 DIAGNOSIS — N2889 Other specified disorders of kidney and ureter: Secondary | ICD-10-CM | POA: Diagnosis not present

## 2023-07-14 DIAGNOSIS — K769 Liver disease, unspecified: Secondary | ICD-10-CM | POA: Diagnosis not present

## 2023-07-14 DIAGNOSIS — E871 Hypo-osmolality and hyponatremia: Secondary | ICD-10-CM | POA: Diagnosis not present

## 2023-07-14 DIAGNOSIS — Z5112 Encounter for antineoplastic immunotherapy: Secondary | ICD-10-CM | POA: Diagnosis not present

## 2023-07-14 DIAGNOSIS — R918 Other nonspecific abnormal finding of lung field: Secondary | ICD-10-CM | POA: Diagnosis not present

## 2023-07-14 DIAGNOSIS — I129 Hypertensive chronic kidney disease with stage 1 through stage 4 chronic kidney disease, or unspecified chronic kidney disease: Secondary | ICD-10-CM | POA: Diagnosis not present

## 2023-07-14 DIAGNOSIS — J984 Other disorders of lung: Secondary | ICD-10-CM | POA: Diagnosis not present

## 2023-07-14 DIAGNOSIS — N1339 Other hydronephrosis: Secondary | ICD-10-CM | POA: Diagnosis not present

## 2023-07-14 DIAGNOSIS — K7689 Other specified diseases of liver: Secondary | ICD-10-CM | POA: Diagnosis not present

## 2023-07-14 DIAGNOSIS — R590 Localized enlarged lymph nodes: Secondary | ICD-10-CM | POA: Diagnosis not present

## 2023-07-14 DIAGNOSIS — C678 Malignant neoplasm of overlapping sites of bladder: Secondary | ICD-10-CM | POA: Diagnosis not present

## 2023-07-14 MED ORDER — FEXOFENADINE HCL 180 MG PO TABS
180.0000 mg | ORAL_TABLET | Freq: Every day | ORAL | 0 refills | Status: AC
  Filled 2023-07-14: qty 90, 90d supply, fill #0

## 2023-07-15 ENCOUNTER — Encounter: Payer: Self-pay | Admitting: Oncology

## 2023-07-15 ENCOUNTER — Other Ambulatory Visit (HOSPITAL_COMMUNITY): Payer: Self-pay

## 2023-07-15 ENCOUNTER — Other Ambulatory Visit: Payer: Self-pay

## 2023-07-16 ENCOUNTER — Encounter (HOSPITAL_COMMUNITY): Payer: Self-pay | Admitting: Interventional Radiology

## 2023-07-16 DIAGNOSIS — M1712 Unilateral primary osteoarthritis, left knee: Secondary | ICD-10-CM

## 2023-07-16 DIAGNOSIS — E871 Hypo-osmolality and hyponatremia: Secondary | ICD-10-CM | POA: Diagnosis not present

## 2023-07-16 DIAGNOSIS — C679 Malignant neoplasm of bladder, unspecified: Secondary | ICD-10-CM | POA: Diagnosis not present

## 2023-07-16 DIAGNOSIS — N1831 Chronic kidney disease, stage 3a: Secondary | ICD-10-CM | POA: Diagnosis not present

## 2023-07-16 DIAGNOSIS — N179 Acute kidney failure, unspecified: Secondary | ICD-10-CM | POA: Diagnosis not present

## 2023-07-16 DIAGNOSIS — T39315A Adverse effect of propionic acid derivatives, initial encounter: Secondary | ICD-10-CM | POA: Diagnosis not present

## 2023-07-16 DIAGNOSIS — E875 Hyperkalemia: Secondary | ICD-10-CM | POA: Diagnosis not present

## 2023-07-27 ENCOUNTER — Other Ambulatory Visit (HOSPITAL_COMMUNITY): Payer: Self-pay

## 2023-07-27 ENCOUNTER — Other Ambulatory Visit: Payer: Self-pay | Admitting: Medical-Surgical

## 2023-07-27 ENCOUNTER — Other Ambulatory Visit: Payer: Self-pay | Admitting: Interventional Radiology

## 2023-07-27 DIAGNOSIS — M1712 Unilateral primary osteoarthritis, left knee: Secondary | ICD-10-CM

## 2023-07-27 DIAGNOSIS — F410 Panic disorder [episodic paroxysmal anxiety] without agoraphobia: Secondary | ICD-10-CM

## 2023-07-27 DIAGNOSIS — R4589 Other symptoms and signs involving emotional state: Secondary | ICD-10-CM

## 2023-07-29 ENCOUNTER — Other Ambulatory Visit: Payer: Self-pay | Admitting: Sports Medicine

## 2023-08-04 DIAGNOSIS — Z5112 Encounter for antineoplastic immunotherapy: Secondary | ICD-10-CM | POA: Diagnosis not present

## 2023-08-04 DIAGNOSIS — C679 Malignant neoplasm of bladder, unspecified: Secondary | ICD-10-CM | POA: Diagnosis not present

## 2023-08-04 DIAGNOSIS — Z79899 Other long term (current) drug therapy: Secondary | ICD-10-CM | POA: Diagnosis not present

## 2023-08-20 ENCOUNTER — Ambulatory Visit
Admission: RE | Admit: 2023-08-20 | Discharge: 2023-08-20 | Disposition: A | Discharge status: Home / Self Care | Payer: Medicare HMO | Source: Ambulatory Visit | Attending: Interventional Radiology

## 2023-08-20 DIAGNOSIS — M1712 Unilateral primary osteoarthritis, left knee: Secondary | ICD-10-CM | POA: Diagnosis not present

## 2023-08-20 DIAGNOSIS — M25562 Pain in left knee: Secondary | ICD-10-CM | POA: Diagnosis not present

## 2023-08-20 HISTORY — PX: IR RADIOLOGIST EVAL & MGMT: IMG5224

## 2023-08-20 NOTE — Progress Notes (Signed)
Reason for follow up:  Status post left geniculate artery embolization.   Referring Physician(s): Thekkekandam,Thomas J   History of Present Illness: Mary Stark is a 67 y.o. female with a medical history significant for advanced stage bladder cancer currently on Keytruda, HTN, alcohol/tobacco use, hepatitis C, CKD, anxiety/depression, hydronephrosis s/p bilateral stents (2021) and bladder cancer. She is familiar to IR from a lymph node biopsy and port-a-catheter placement 2021. She also has a history of bilateral knee osteoarthritis and has had steroid injections and the Orthovisc series which she finished a few months ago. The right knee did well but the left knee continues to hurt. The patient is not interested in arthroplasty.  She was referred for genicular RFA but was denied by insurance.    She has now been referred to IR to discuss genicular artery embolization.    Her left knee pain waxes and wanes, and is most severe when walking and turning. The pain occasionally wakes her up at night when it is severe.  Given her advanced stage bladder cancer diagnosis, she is against having surgery to deal with her knee pain and wants to pursue a minimally invasive option.   Womac Pain Score = 46/96 VAS Pain Score = 3/10  She underwent left geniculate artery embolization on 07/08/23.  She's states that the first month after the procedure she had significantly improved symptoms, however has developed some recurrent pain after climbing the bleachers at her family member's soccer game recently.  She denies skin changes.  She states that the naproxen after the procedure affected her renal function and potassium excretion.    Womac Pain Score = 21/96 VAS Pain Score = 4/10  Past Medical History:  Diagnosis Date   Adenocarcinoma of bladder, stage 4 (HCC) 10/2020   oncologist--- dr Clelia Croft and urologist-- dr pace;  w/ mets , started chemo 12/ 2021 and bilateral ureteral stents to treat  hydronephrosis   Alcohol dependence (HCC)    drank heavily years ago doesn't now 10/22/2021   Anemia due to chemotherapy    Bilateral hydronephrosis    urologsit--- dr pace;   chronic issue,  treated with ureteral stents   CKD (chronic kidney disease), stage III (HCC)    Diverticulosis of colon    Encounter for antineoplastic chemotherapy    Family history of adverse reaction to anesthesia    father had a "tight" airway   GAD (generalized anxiety disorder)    GERD (gastroesophageal reflux disease)    History of 2019 novel coronavirus disease (COVID-19) 08/13/2020   positive result in epic 08-19-2020,  hospital admission in epic,  covid pneumonia with hypoxia respiratory failure, discharged with oxygen for 6 weeks   History of adenomatous polyp of colon    History of chemotherapy last done 05-13-2021   History of colitis 03/2018   acute colitis with sepsis   History of hepatitis C    completed treatment in 2012   History of kidney stones    History of rheumatic fever as a child    per pt no valvular issues   History of sepsis    11/ 2021hospital admission in epic due to pyelonephritis due to hydronephrosis, resolved//   urosepsis 2022   History of skin cancer    excision from nose   History of small bowel obstruction 05/25/2022   s/p  right hemicolectomy   Hyperlipidemia    Hypertension    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HCC)    MDD (major depressive disorder)  Metastasis from bladder cancer (HCC)    Neoplastic (malignant) related fatigue    OA (osteoarthritis)    Back and lt knee   Palliative care patient    Port-A-Cath in place 11/18/2020   Seasonal allergies    Vitamin D deficiency    Wears glasses     Past Surgical History:  Procedure Laterality Date   ANAL RECTAL MANOMETRY N/A 01/31/2020   Procedure: ANO RECTAL MANOMETRY;  Surgeon: Willis Modena, MD;  Location: [facility];  Service: Endoscopy;  Laterality: N/A;   CESAREAN SECTION  1984   COLONOSCOPY WITH  PROPOFOL  05/07/2022   [facility]--- dr outlaw   CYSTOSCOPY W/ URETERAL STENT PLACEMENT Bilateral 01/21/2021   Procedure: CYSTOSCOPY WITH STENT REPLACEMENT;  Surgeon: Noel Christmas, MD;  Location: [facility];  Service: Urology;  Laterality: Bilateral;  1 HR   CYSTOSCOPY W/ URETERAL STENT PLACEMENT Bilateral 05/20/2021   Procedure: CYSTOSCOPY WITH RETROGRADE PYELOGRAM/URETERAL STENT EXCHANGE;  Surgeon: Noel Christmas, MD;  Location[facility];  Service: Urology;  Laterality: Bilateral;   CYSTOSCOPY W/ URETERAL STENT PLACEMENT Bilateral 10/28/2021   Procedure: CYSTOSCOPY WITH RETROGRADE PYELOGRAM/URETERAL STENT EXCHANGE;  Surgeon: Noel Christmas, MD;  Location: [facility];  Service: Urology;  Laterality: Bilateral;   CYSTOSCOPY W/ URETERAL STENT PLACEMENT Bilateral 04/28/2022   Procedure: CYSTOSCOPY Otilio Miu STENT EXCHANGE;  Surgeon: Noel Christmas, MD;  Location: [facility];  Service: Urology;  Laterality: Bilateral;  1 HR   CYSTOSCOPY W/ URETERAL STENT PLACEMENT Bilateral 11/17/2022   Procedure: CYSTOSCOPY WITH RETROGRADE PYELOGRAM/URETERAL STENT REPLACEMENT;  Surgeon: Noel Christmas, MD;  Location: [facility];  Service: Urology;  Laterality: Bilateral;   CYSTOSCOPY WITH RETROGRADE PYELOGRAM, URETEROSCOPY AND STENT PLACEMENT Bilateral 06/11/2020   Procedure: CYSTOSCOPY WITH RETROGRADE PYELOGRAM, URETEROSCOPY AND STENT PLACEMENT, RIGHT URETERAL BIOPSY;  Surgeon: Noel Christmas, MD;  Location: [facility];  Service: Urology;  Laterality: Bilateral;  90 MINS   CYSTOSCOPY WITH RETROGRADE PYELOGRAM, URETEROSCOPY AND STENT PLACEMENT Bilateral 07/05/2020   Procedure: CYSTOSCOPY WITH RETROGRADE PYELOGRAM, URETEROSCOPY,  BIOPSIES AND STENT EXCHANGES;  Surgeon: Noel Christmas, MD;  Location[facility];  Service: Urology;  Laterality: Bilateral;   FINGER SURGERY Right    5th   GYNECOLOGIC CRYOSURGERY   YRS AGO   IR 3D INDEPENDENT WKST  07/08/2023   IR ANGIOGRAM EXTREMITY LEFT  07/08/2023   IR ANGIOGRAM SELECTIVE EACH ADDITIONAL VESSEL  07/08/2023   IR ANGIOGRAM SELECTIVE EACH ADDITIONAL VESSEL  07/08/2023   IR EMBO TUMOR ORGAN ISCHEMIA INFARCT INC GUIDE ROADMAPPING  07/08/2023   IR IMAGING GUIDED PORT INSERTION  11/18/2020   IR RADIOLOGIST EVAL & MGMT  06/09/2023   IR US GUIDE VASC ACCESS LEFT  07/08/2023   PARTIAL COLECTOMY Right 05/25/2022   Procedure: EXPLORATORY LAPAROTOMY; RIGHT HEMI COLECTOMY; PARTIAL OMENTECTOMY;  Surgeon: Manus Rudd, MD;  Location: [facility];  Service: General;  Laterality: Right;   TONSILLECTOMY     chld   TRANSURETHRAL RESECTION OF BLADDER TUMOR  01/21/2021   Procedure: TRANSURETHRAL RESECTION OF BLADDER TUMOR (TURBT);  Surgeon: Noel Christmas, MD;  Location[facility];  Service: Urology;;    Allergies: Penicillins and Metformin  Medications: Prior to Admission medications   Medication Sig Start Date End Date Taking? Authorizing Provider  albuterol (PROAIR HFA) 108 (90 Base) MCG/ACT inhaler INHALE 1 TO 2 PUFFS BY MOUTH INTO THE LUNGS EVERY 4 HOURS AS NEEDED FOR WHEEZING OR SHORTNESS OF BREATH 02/16/23  Christen Butter, NP  ARIPiprazole (ABILIFY) 10 MG tablet TAKE 1 TABLET AT BEDTIME 02/19/23   Christen Butter, NP  buPROPion (WELLBUTRIN XL) 300 MG 24 hr tablet Take 1 tablet (300 mg total) by mouth daily. 04/01/23   Christen Butter, NP  clonazePAM Scarlette Calico) 1 MG tablet May take 0.5 tablets (0.5 mg total) by mouth daily as needed. May also take 1 tablet (1 mg total) at bedtime as needed. 07/01/23   Christen Butter, NP  fexofenadine (FT ALLERGY RELIEF 24 HOUR) 180 MG tablet Take 1 tablet (180 mg total) by mouth daily. 07/14/23   Christen Butter, NP  gabapentin (NEURONTIN) 300 MG capsule TAKE 1 CAPSULE DAILY AND 4 CAPSULES AT BEDTIME AS DIRECTED. 02/24/23   Christen Butter, NP  lidocaine-prilocaine (EMLA) cream Apply 1 application topically as needed. Patient taking differently:  Apply 1 application  topically as needed (for port access). 10/16/21   Benjiman Core, MD  Meth-Hyo-M Bl-Na Phos-Ph Sal (URIBEL) 118 MG CAPS Take one capsule by mouth 3 times daily Patient taking differently: as needed (spasms). 06/06/21     methylPREDNISolone (MEDROL DOSEPAK) 4 MG TBPK tablet TAKE AS DIRECTED 07/08/23   Allred, Darrell K, PA-C  MOUNJARO 5 MG/0.5ML Pen Inject into the skin. 05/14/23   [provider]  naproxen (NAPROSYN) 500 MG tablet Take 1 tablet (500 mg total) by mouth 2 (two) times daily with a meal. 07/08/23   Allred, Darrell K, PA-C  nebivolol (BYSTOLIC) 10 MG tablet TAKE 1 TABLET EVERY DAY 04/16/23   Christen Butter, NP  nitrofurantoin, macrocrystal-monohydrate, (MACROBID) 100 MG capsule Take 1 capsule (100 mg total) by mouth 2 (two) times daily. 07/01/23   Christen Butter, NP  ondansetron (ZOFRAN) 4 MG tablet Take 1 tablet (4 mg total) by mouth every 8 (eight) hours as needed for nausea or vomiting. 04/30/21   Benjiman Core, MD  oxybutynin (DITROPAN) 5 MG tablet Take 1 tablet (5 mg total) by mouth every 8 (eight) hours as needed. Patient taking differently: Take 5 mg by mouth every 8 (eight) hours as needed for bladder spasms. 10/06/22 10/06/23    phenazopyridine (PYRIDIUM) 200 MG tablet Take 200 mg by mouth 3 (three) times daily. 07/01/23   [provider]  phentermine 37.5 MG capsule Take 37.5 mg by mouth every morning.    [provider]  sertraline (ZOLOFT) 25 MG tablet TAKE 1 TABLET EVERY DAY 04/16/23   Christen Butter, NP  simvastatin (ZOCOR) 20 MG tablet Take 1 tablet (20 mg total) by mouth daily. Needs lab work. 04/01/23   Christen Butter, NP  sodium bicarbonate 650 MG tablet Take by mouth. 06/02/23 11/29/23  [provider]  tamsulosin (FLOMAX) 0.4 MG CAPS capsule Take 1 capsule (0.4 mg total) by mouth at bedtime. 09/22/22     traMADol (ULTRAM) 50 MG tablet Take 1-2 tablets (50-100 mg total) by mouth 3 (three) times daily as needed. 07/30/23   Monica Becton, MD     Family History  Problem Relation Age of Onset   Ovarian cancer Mother    Cardiomyopathy Father    Valvular heart disease Father    Liver cancer Brother    Protein C deficiency Brother    Protein C deficiency Brother    Stroke Brother     Social History   Socioeconomic History   Marital status: Married    Spouse name: Not on file   Number of children: Not on file   Years of education: Not on file  Highest education level: Not on file  Occupational History   Not on file  Tobacco Use   Smoking status: Former    Current packs/day: 0.00    Average packs/day: 1 pack/day for 25.0 years (25.0 ttl pk-yrs)    Types: Cigarettes    Start date: 8    Quit date: 2012    Years since quitting: 12.6   Smokeless tobacco: Never  Vaping Use   Vaping status: Every Day   Substances: Nicotine   Devices: unsure name  Substance and Sexual Activity   Alcohol use: Yes    Comment: occasional beer   Drug use: Never   Sexual activity: Not on file  Other Topics Concern   Not on file  Social History Narrative   Not on file   Social Determinants of Health   Financial Resource Strain: Low Risk  (06/21/2023)   Received from [facility]   Overall Financial Resource Strain (CARDIA)    Difficulty of Paying Living Expenses: Not hard at all  Food Insecurity: Low Risk  (07/16/2023)   Received from [facility]   Hunger Vital Sign    Worried About Running Out of Food in the Last Year: Never true    Ran Out of Food in the Last Year: Never true  Transportation Needs: Not on file (07/16/2023)  Physical Activity: Insufficiently Active (06/21/2023)   Received from [facility]   Exercise Vital Sign    Days of Exercise per Week: 2 days    Minutes of Exercise per Session: 30 min  Stress: Stress Concern Present (06/21/2023)   Received from [facility] - Occupational Stress Questionnaire    Feeling of Stress : To some extent  Social  Connections: Moderately Integrated (06/21/2023)   Received from [facility]   Social Network    How would you rate your social network (family, work, friends)?: Adequate participation with social networks     Vital Signs: There were no vitals taken for this visit.  No physical examination was performed in lieu of virtual telephone clinic visit.  Imaging: Left knee 11/27/20  Kellgren and Lyman Bishop grade 3 (Moderate)  Left GAE (07/08/23)    0.5 mL dilute 100-300 micron embospheres administered into articular branch of left descending geniculate artery  Labs:  CBC: Recent Labs    10/14/22 0856 11/04/22 0954 11/25/22 1124 07/08/23 0809  WBC 7.5 7.3 6.8 5.6  HGB 11.5* 12.3 11.3* 10.1*  HCT 33.8* 36.1 33.3* 31.9*  PLT 172 186 195 184    COAGS: Recent Labs    07/08/23 0809  INR 1.1    BMP: Recent Labs    10/14/22 0856 11/04/22 0954 11/25/22 1124 07/08/23 0809  NA 132* 131* 130* 130*  K 4.3 4.3 4.6 4.0  CL 99 99 99 98  CO2 27 23 26 22   GLUCOSE 93 91 82 80  BUN 15 20 16 14   CALCIUM 9.0 9.1 9.6 8.9  CREATININE 1.03* 1.21* 1.04* 1.10*  GFRNONAA >60 50* 60* 56*    LIVER FUNCTION TESTS: Recent Labs    09/23/22 1022 10/14/22 0856 11/04/22 0954 11/25/22 1124  BILITOT 0.3 0.2* 0.7 0.4  AST 18 17 33 18  ALT 14 15 27 13   ALKPHOS 74 74 74 71  PROT 7.4 7.1 7.9 7.2  ALBUMIN 4.3 4.1 4.4 4.2    Assessment and Plan: 67 year old female with history of severe left knee pain secondary to osteoarthritis (K&G grade 3) with lifestyle limiting pain and  discomfort refractory to conservative measures including sustained release steroid injections and hyaluronic acid injections. She has advanced stage bladder cancer and wishes to pursue minimally invasive options to alleviate her knee pain, therefore underwent technically successful left GAE on 07/08/23.  She has recovered well, with improved WOMAC pain score from 46/96 to 21/96.  She states that the pain has crept back some  over the past couple weeks, possibly due to a new injury climbing bleachers recently.  She plans to reach out to see Dr. Benjamin Stain again soon.  Plan to follow up in IR clinic in 3 months.  Electronically Signed: Bennie Dallas 08/20/2023, 1:32 PM   I spent a total of 40 Minutes in virtual telephone clinical consultation, greater than 50% of which was counseling/coordinating care for left knee pain.

## 2023-08-24 ENCOUNTER — Ambulatory Visit (INDEPENDENT_AMBULATORY_CARE_PROVIDER_SITE_OTHER): Payer: Medicare HMO | Admitting: Medical-Surgical

## 2023-08-24 ENCOUNTER — Ambulatory Visit (INDEPENDENT_AMBULATORY_CARE_PROVIDER_SITE_OTHER): Payer: Medicare HMO

## 2023-08-24 ENCOUNTER — Encounter: Payer: Self-pay | Admitting: Medical-Surgical

## 2023-08-24 VITALS — BP 119/66 | HR 76 | Resp 20 | Ht 66.0 in | Wt 132.0 lb

## 2023-08-24 DIAGNOSIS — M25552 Pain in left hip: Secondary | ICD-10-CM

## 2023-08-24 DIAGNOSIS — Z0000 Encounter for general adult medical examination without abnormal findings: Secondary | ICD-10-CM

## 2023-08-24 DIAGNOSIS — Z23 Encounter for immunization: Secondary | ICD-10-CM | POA: Diagnosis not present

## 2023-08-24 DIAGNOSIS — M160 Bilateral primary osteoarthritis of hip: Secondary | ICD-10-CM | POA: Diagnosis not present

## 2023-08-24 NOTE — Patient Instructions (Signed)
  Mary Stark , Thank you for taking time to come for your Medicare Wellness Visit. I appreciate your ongoing commitment to your health goals. Please review the following plan we discussed and let me know if I can assist you in the future.   These are the goals we discussed:  Goals      Patient Stated     Maintain normal weight over the next year.         This is a list of the screening recommended for you and due dates:  Health Maintenance  Topic Date Due   Flu Shot  07/15/2023   COVID-19 Vaccine (7 - 2023-24 season) 08/15/2023   Screening for Lung Cancer  11/20/2023   Mammogram  03/01/2024   Medicare Annual Wellness Visit  08/23/2024   DTaP/Tdap/Td vaccine (2 - Td or Tdap) 10/30/2025   Colon Cancer Screening  05/08/2027   Pneumonia Vaccine  Completed   DEXA scan (bone density measurement)  Completed   Hepatitis C Screening  Completed   HPV Vaccine  Aged Out   Zoster (Shingles) Vaccine  Discontinued

## 2023-08-24 NOTE — Progress Notes (Signed)
Complete physical exam  Patient: Mary Stark   DOB: 21-Mar-1956   67 y.o. Female  MRN: [id number]  Subjective:    Chief Complaint  Patient presents with   Annual Exam    MEDICARE WELLNESS VISIT    Mary Stark is a 67 y.o. female who presents today for a complete physical exam. She reports consuming a general diet. Exercise is limited by orthopedic conditions and chronic pain. She generally feels poor. She reports sleeping well. She does not have additional problems to discuss today.    Most recent fall risk assessment:    08/24/2023    3:41 PM  Fall Risk   Falls in the past year? 0  Follow up Falls evaluation completed     Most recent depression screenings:    07/01/2023    4:03 PM 05/19/2022   11:30 AM  PHQ 2/9 Scores  PHQ - 2 Score 4 3  PHQ- 9 Score 8 7    Vision:Within last year and Dental: No current dental problems and Receives regular dental care    Patient Care Team: Christen Butter, NP as PCP - General (Nurse Practitioner) Archer Asa, MD as Consulting Physician (Psychiatry) Rodolph Bong, MD as Consulting Physician (Sports Medicine) Benjiman Core, MD (Inactive) as Consulting Physician (Oncology) Willis Modena, MD as Consulting Physician (Gastroenterology)   Outpatient Medications Prior to Visit  Medication Sig   albuterol (PROAIR HFA) 108 (90 Base) MCG/ACT inhaler INHALE 1 TO 2 PUFFS BY MOUTH INTO THE LUNGS EVERY 4 HOURS AS NEEDED FOR WHEEZING OR SHORTNESS OF BREATH   ARIPiprazole (ABILIFY) 10 MG tablet TAKE 1 TABLET AT BEDTIME   buPROPion (WELLBUTRIN XL) 300 MG 24 hr tablet Take 1 tablet (300 mg total) by mouth daily.   clonazePAM (KLONOPIN) 1 MG tablet May take 0.5 tablets (0.5 mg total) by mouth daily as needed. May also take 1 tablet (1 mg total) at bedtime as needed.   fexofenadine (FT ALLERGY RELIEF 24 HOUR) 180 MG tablet Take 1 tablet (180 mg total) by mouth daily.   gabapentin (NEURONTIN) 300 MG capsule TAKE 1 CAPSULE DAILY AND 4 CAPSULES  AT BEDTIME AS DIRECTED.   lidocaine-prilocaine (EMLA) cream Apply 1 application topically as needed. (Patient taking differently: Apply 1 application  topically as needed (for port access).)   Meth-Hyo-M Bl-Na Phos-Ph Sal (URIBEL) 118 MG CAPS Take one capsule by mouth 3 times daily (Patient taking differently: as needed (spasms).)   MOUNJARO 5 MG/0.5ML Pen Inject into the skin.   naproxen (NAPROSYN) 500 MG tablet Take 1 tablet (500 mg total) by mouth 2 (two) times daily with a meal.   nebivolol (BYSTOLIC) 10 MG tablet TAKE 1 TABLET EVERY DAY (Patient taking differently: Take 5 mg by mouth daily.)   ondansetron (ZOFRAN) 4 MG tablet Take 1 tablet (4 mg total) by mouth every 8 (eight) hours as needed for nausea or vomiting.   oxybutynin (DITROPAN) 5 MG tablet Take 1 tablet (5 mg total) by mouth every 8 (eight) hours as needed. (Patient taking differently: Take 5 mg by mouth every 8 (eight) hours as needed for bladder spasms.)   phenazopyridine (PYRIDIUM) 200 MG tablet Take 200 mg by mouth 3 (three) times daily.   phentermine 37.5 MG capsule Take 37.5 mg by mouth every morning.   sertraline (ZOLOFT) 25 MG tablet TAKE 1 TABLET EVERY DAY   simvastatin (ZOCOR) 20 MG tablet Take 1 tablet (20 mg total) by mouth daily. Needs lab work.   sodium bicarbonate  650 MG tablet Take by mouth.   tamsulosin (FLOMAX) 0.4 MG CAPS capsule Take 1 capsule (0.4 mg total) by mouth at bedtime.   traMADol (ULTRAM) 50 MG tablet Take 1-2 tablets (50-100 mg total) by mouth 3 (three) times daily as needed.   [DISCONTINUED] methylPREDNISolone (MEDROL DOSEPAK) 4 MG TBPK tablet TAKE AS DIRECTED   [DISCONTINUED] nitrofurantoin, macrocrystal-monohydrate, (MACROBID) 100 MG capsule Take 1 capsule (100 mg total) by mouth 2 (two) times daily.   No facility-administered medications prior to visit.    Review of Systems  Constitutional:  Positive for malaise/fatigue. Negative for chills, fever and weight loss.  HENT:  Negative for  congestion, ear pain, hearing loss, sinus pain and sore throat.   Eyes:  Negative for blurred vision, photophobia and pain.  Respiratory:  Positive for shortness of breath. Negative for cough and wheezing.   Cardiovascular:  Negative for chest pain, palpitations and leg swelling.  Gastrointestinal:  Positive for constipation and diarrhea. Negative for abdominal pain, blood in stool, heartburn, melena, nausea and vomiting.  Genitourinary:  Negative for dysuria, frequency and urgency.       Bladder spasms, urinary frequency, foul urinary odor, cloudy urine.   Musculoskeletal:  Positive for back pain, joint pain, myalgias and neck pain. Negative for falls.  Skin:  Negative for itching and rash.  Neurological:  Positive for dizziness and weakness.  Endo/Heme/Allergies:  Negative for polydipsia. Does not bruise/bleed easily.  Psychiatric/Behavioral:  Positive for depression. Negative for substance abuse and suicidal ideas. The patient is nervous/anxious. The patient does not have insomnia.      Objective:     BP 119/66 (BP Location: Left Arm, Cuff Size: Small)   Pulse 76   Resp 20   Ht 5\' 6"  (1.676 m)   Wt 132 lb (59.9 kg)   SpO2 100%   BMI 21.31 kg/m    Physical Exam Vitals reviewed.  Constitutional:      General: She is not in acute distress.    Appearance: Normal appearance. She is normal weight. She is not ill-appearing.  HENT:     Head: Normocephalic and atraumatic.     Right Ear: Tympanic membrane, ear canal and external ear normal. There is no impacted cerumen.     Left Ear: Tympanic membrane, ear canal and external ear normal. There is no impacted cerumen.     Nose: Nose normal. No congestion or rhinorrhea.     Mouth/Throat:     Mouth: Mucous membranes are moist.     Pharynx: No oropharyngeal exudate or posterior oropharyngeal erythema.  Eyes:     General: No scleral icterus.       Right eye: No discharge.        Left eye: No discharge.     Extraocular Movements:  Extraocular movements intact.     Conjunctiva/sclera: Conjunctivae normal.     Pupils: Pupils are equal, round, and reactive to light.  Neck:     Thyroid: No thyromegaly.     Vascular: No carotid bruit or JVD.     Trachea: Trachea normal.  Cardiovascular:     Rate and Rhythm: Normal rate and regular rhythm.     Pulses: Normal pulses.     Heart sounds: Normal heart sounds. No murmur heard.    No friction rub. No gallop.  Pulmonary:     Effort: Pulmonary effort is normal. No respiratory distress.     Breath sounds: Normal breath sounds. No wheezing.  Abdominal:     General: Bowel sounds  are normal. There is no distension.     Palpations: Abdomen is soft.     Tenderness: There is no abdominal tenderness. There is no guarding.  Musculoskeletal:        General: Normal range of motion.     Cervical back: Normal range of motion and neck supple.  Lymphadenopathy:     Cervical: No cervical adenopathy.  Skin:    General: Skin is warm and dry.  Neurological:     Mental Status: She is alert and oriented to person, place, and time.     Cranial Nerves: No cranial nerve deficit.  Psychiatric:        Attention and Perception: Attention normal.        Mood and Affect: Mood is depressed.        Speech: Speech normal.        Behavior: Behavior normal.        Thought Content: Thought content normal.        Judgment: Judgment normal.   No results found for any visits on 08/24/23.     Assessment & Plan:    Routine Health Maintenance and Physical Exam  Immunization History  Administered Date(s) Administered   Influenza Inj Mdck Quad Pf 09/02/2015   Influenza,inj,Quad PF,6+ Mos 11/13/2020   Influenza-Unspecified 09/02/2015, 09/13/2018, 09/13/2019, 09/08/2021, 09/17/2022   PFIZER Comirnaty(Gray Top)Covid-19 Tri-Sucrose Vaccine 06/27/2021   PFIZER(Purple Top)SARS-COV-2 Vaccination 12/11/2019, 01/01/2020, 12/12/2020   PNEUMOCOCCAL CONJUGATE-20 09/15/2021   Pfizer Covid-19 Vaccine Bivalent  Booster 29yrs & up 10/31/2021, 09/02/2022   RSV,unspecified 09/17/2022   Tdap 10/31/2015    Health Maintenance  Topic Date Due   INFLUENZA VACCINE  07/15/2023   COVID-19 Vaccine (7 - 2023-24 season) 08/15/2023   Lung Cancer Screening  11/20/2023   MAMMOGRAM  03/01/2024   Medicare Annual Wellness (AWV)  08/23/2024   DTaP/Tdap/Td (2 - Td or Tdap) 10/30/2025   Colonoscopy  05/08/2027   Pneumonia Vaccine 39+ Years old  Completed   DEXA SCAN  Completed   Hepatitis C Screening  Completed   HPV VACCINES  Aged Out   Zoster Vaccines- Shingrix  Discontinued    Discussed health benefits of physical activity, and encouraged her to engage in regular exercise appropriate for her age and condition.  1. Encounter for Medicare annual wellness exam See separate note.  2. Annual physical exam Up-to-date on preventative care.  Up-to-date on medical management with multiple specialties.  Wellness information provided with AVS.  Deferring labs today.  3. Left hip pain Has had about a month of left hip pain that is severely limiting her ability to perform daily activities.  She is already on tramadol chronically and had hydrocodone to use on an as needed basis but has run out of this.  Notes that the pain takes 3 to 4 hours to start to ease off and she has significant difficulty with getting up out of a chair or off a low seat.  Getting x-rays today for further evaluation.  Unable to take NSAIDs, concern for liver lesion with acetaminophen. - DG Hip Unilat W OR W/O Pelvis Min 4 Views Left; Future   Return in 1 year (on 08/23/2024) for AWV/CPE .     Christen Butter, NP

## 2023-08-24 NOTE — Progress Notes (Signed)
Subjective:    Mary Stark is a 68 y.o. female who presents for a Welcome to Medicare exam.   Review of Systems Mary Stark is a pleasant 67 year old retired [employer] who previously worked in the ICU.  She lives at home with her husband.  They have 1 daughter and 2 grandchildren that she sees on a regular basis.  She is socially active and likes to play video games and do quilting.  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  Cardiac Risk Factors include: advanced age (>44men, >27 women);dyslipidemia;sedentary lifestyle      Objective:    Today's Vitals   08/24/23 1532 08/24/23 1535  BP:  119/66  Pulse:  76  Resp:  20  SpO2:  100%  Weight:  132 lb (59.9 kg)  Height:  5\' 6"  (1.676 m)  PainSc: 7    Body mass index is 21.31 kg/m.  Medications Outpatient Encounter Medications as of 08/24/2023  Medication Sig   albuterol (PROAIR HFA) 108 (90 Base) MCG/ACT inhaler INHALE 1 TO 2 PUFFS BY MOUTH INTO THE LUNGS EVERY 4 HOURS AS NEEDED FOR WHEEZING OR SHORTNESS OF BREATH   ARIPiprazole (ABILIFY) 10 MG tablet TAKE 1 TABLET AT BEDTIME   buPROPion (WELLBUTRIN XL) 300 MG 24 hr tablet Take 1 tablet (300 mg total) by mouth daily.   clonazePAM (KLONOPIN) 1 MG tablet May take 0.5 tablets (0.5 mg total) by mouth daily as needed. May also take 1 tablet (1 mg total) at bedtime as needed.   fexofenadine (FT ALLERGY RELIEF 24 HOUR) 180 MG tablet Take 1 tablet (180 mg total) by mouth daily.   gabapentin (NEURONTIN) 300 MG capsule TAKE 1 CAPSULE DAILY AND 4 CAPSULES AT BEDTIME AS DIRECTED.   lidocaine-prilocaine (EMLA) cream Apply 1 application topically as needed. (Patient taking differently: Apply 1 application  topically as needed (for port access).)   Meth-Hyo-M Bl-Na Phos-Ph Sal (URIBEL) 118 MG CAPS Take one capsule by mouth 3 times daily (Patient taking differently: as needed (spasms).)   MOUNJARO 5 MG/0.5ML Pen Inject into the skin.   naproxen (NAPROSYN) 500  MG tablet Take 1 tablet (500 mg total) by mouth 2 (two) times daily with a meal.   nebivolol (BYSTOLIC) 10 MG tablet TAKE 1 TABLET EVERY DAY (Patient taking differently: Take 5 mg by mouth daily.)   ondansetron (ZOFRAN) 4 MG tablet Take 1 tablet (4 mg total) by mouth every 8 (eight) hours as needed for nausea or vomiting.   oxybutynin (DITROPAN) 5 MG tablet Take 1 tablet (5 mg total) by mouth every 8 (eight) hours as needed. (Patient taking differently: Take 5 mg by mouth every 8 (eight) hours as needed for bladder spasms.)   phenazopyridine (PYRIDIUM) 200 MG tablet Take 200 mg by mouth 3 (three) times daily.   phentermine 37.5 MG capsule Take 37.5 mg by mouth every morning.   sertraline (ZOLOFT) 25 MG tablet TAKE 1 TABLET EVERY DAY   simvastatin (ZOCOR) 20 MG tablet Take 1 tablet (20 mg total) by mouth daily. Needs lab work.   sodium bicarbonate 650 MG tablet Take by mouth.   tamsulosin (FLOMAX) 0.4 MG CAPS capsule Take 1 capsule (0.4 mg total) by mouth at bedtime.   traMADol (ULTRAM) 50 MG tablet Take 1-2 tablets (50-100 mg total) by mouth 3 (three) times daily as needed.   [DISCONTINUED] methylPREDNISolone (MEDROL DOSEPAK) 4 MG TBPK tablet TAKE AS DIRECTED   [DISCONTINUED] nitrofurantoin, macrocrystal-monohydrate, (MACROBID) 100 MG  capsule Take 1 capsule (100 mg total) by mouth 2 (two) times daily.   No facility-administered encounter medications on file as of 08/24/2023.     History: Past Medical History:  Diagnosis Date   Adenocarcinoma of bladder, stage 4 (HCC) 10/2020   oncologist--- dr Clelia Croft and urologist-- dr pace;  w/ mets , started chemo 12/ 2021 and bilateral ureteral stents to treat hydronephrosis   Alcohol dependence (HCC)    drank heavily years ago doesn't now 10/22/2021   Anemia due to chemotherapy    Bilateral hydronephrosis    urologsit--- dr pace;   chronic issue,  treated with ureteral stents   CKD (chronic kidney disease), stage III (HCC)    Diverticulosis of colon     Encounter for antineoplastic chemotherapy    Family history of adverse reaction to anesthesia    father had a "tight" airway   GAD (generalized anxiety disorder)    GERD (gastroesophageal reflux disease)    History of 2019 novel coronavirus disease (COVID-19) 08/13/2020   positive result in epic 08-19-2020,  hospital admission in epic,  covid pneumonia with hypoxia respiratory failure, discharged with oxygen for 6 weeks   History of adenomatous polyp of colon    History of chemotherapy last done 05-13-2021   History of colitis 03/2018   acute colitis with sepsis   History of hepatitis C    completed treatment in 2012   History of kidney stones    History of rheumatic fever as a child    per pt no valvular issues   History of sepsis    11/ 2021hospital admission in epic due to pyelonephritis due to hydronephrosis, resolved//   urosepsis 2022   History of skin cancer    excision from nose   History of small bowel obstruction 05/25/2022   s/p  right hemicolectomy   Hyperlipidemia    Hypertension    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HCC)    MDD (major depressive disorder)    Metastasis from bladder cancer (HCC)    Neoplastic (malignant) related fatigue    OA (osteoarthritis)    Back and lt knee   Palliative care patient    Port-A-Cath in place 11/18/2020   Seasonal allergies    Vitamin D deficiency    Wears glasses    Past Surgical History:  Procedure Laterality Date   ANAL RECTAL MANOMETRY N/A 01/31/2020   Procedure: ANO RECTAL MANOMETRY;  Surgeon: Willis Modena, MD;  Location: [facility];  Service: Endoscopy;  Laterality: N/A;   CESAREAN SECTION  1984   COLONOSCOPY WITH PROPOFOL  05/07/2022   [facility] dr outlaw   CYSTOSCOPY W/ URETERAL STENT PLACEMENT Bilateral 01/21/2021   Procedure: CYSTOSCOPY WITH STENT REPLACEMENT;  Surgeon: Noel Christmas, MD;  Location[facility];  Service: Urology;  Laterality: Bilateral;  1 HR   CYSTOSCOPY W/ URETERAL STENT  PLACEMENT Bilateral 05/20/2021   Procedure: CYSTOSCOPY WITH RETROGRADE PYELOGRAM/URETERAL STENT EXCHANGE;  Surgeon: Noel Christmas, MD;  Location: [facility];  Service: Urology;  Laterality: Bilateral;   CYSTOSCOPY W/ URETERAL STENT PLACEMENT Bilateral 10/28/2021   Procedure: CYSTOSCOPY WITH RETROGRADE PYELOGRAM/URETERAL STENT EXCHANGE;  Surgeon: Noel Christmas, MD;  Location: [facility];  Service: Urology;  Laterality: Bilateral;   CYSTOSCOPY W/ URETERAL STENT PLACEMENT Bilateral 04/28/2022   Procedure: CYSTOSCOPY Otilio Miu STENT EXCHANGE;  Surgeon: Noel Christmas, MD;  Location: [facility];  Service: Urology;  Laterality: Bilateral;  1 HR   CYSTOSCOPY W/ URETERAL STENT  PLACEMENT Bilateral 11/17/2022   Procedure: CYSTOSCOPY WITH RETROGRADE PYELOGRAM/URETERAL STENT REPLACEMENT;  Surgeon: Noel Christmas, MD;  Location: [facility];  Service: Urology;  Laterality: Bilateral;   CYSTOSCOPY WITH RETROGRADE PYELOGRAM, URETEROSCOPY AND STENT PLACEMENT Bilateral 06/11/2020   Procedure: CYSTOSCOPY WITH RETROGRADE PYELOGRAM, URETEROSCOPY AND STENT PLACEMENT, RIGHT URETERAL BIOPSY;  Surgeon: Noel Christmas, MD;  Location: [facility];  Service: Urology;  Laterality: Bilateral;  90 MINS   CYSTOSCOPY WITH RETROGRADE PYELOGRAM, URETEROSCOPY AND STENT PLACEMENT Bilateral 07/05/2020   Procedure: CYSTOSCOPY WITH RETROGRADE PYELOGRAM, URETEROSCOPY,  BIOPSIES AND STENT EXCHANGES;  Surgeon: Noel Christmas, MD;  Location: [facility];  Service: Urology;  Laterality: Bilateral;   FINGER SURGERY Right    5th   GYNECOLOGIC CRYOSURGERY  YRS AGO   IR 3D INDEPENDENT WKST  07/08/2023   IR ANGIOGRAM EXTREMITY LEFT  07/08/2023   IR ANGIOGRAM SELECTIVE EACH ADDITIONAL VESSEL  07/08/2023   IR ANGIOGRAM SELECTIVE EACH ADDITIONAL VESSEL  07/08/2023   IR EMBO TUMOR ORGAN ISCHEMIA INFARCT INC GUIDE ROADMAPPING  07/08/2023   IR IMAGING GUIDED PORT  INSERTION  11/18/2020   IR RADIOLOGIST EVAL & MGMT  06/09/2023   IR RADIOLOGIST EVAL & MGMT  08/20/2023   IR US GUIDE VASC ACCESS LEFT  07/08/2023   PARTIAL COLECTOMY Right 05/25/2022   Procedure: EXPLORATORY LAPAROTOMY; RIGHT HEMI COLECTOMY; PARTIAL OMENTECTOMY;  Surgeon: Manus Rudd, MD;  Location: [facility];  Service: General;  Laterality: Right;   TONSILLECTOMY     chld   TRANSURETHRAL RESECTION OF BLADDER TUMOR  01/21/2021   Procedure: TRANSURETHRAL RESECTION OF BLADDER TUMOR (TURBT);  Surgeon: Noel Christmas, MD;  Location: [facility];  Service: Urology;;    Family History  Problem Relation Age of Onset   Ovarian cancer Mother    Cardiomyopathy Father    Valvular heart disease Father    Liver cancer Brother    Protein C deficiency Brother    Protein C deficiency Brother    Stroke Brother    Social History   Occupational History   Not on file  Tobacco Use   Smoking status: Former    Current packs/day: 0.00    Average packs/day: 1 pack/day for 25.0 years (25.0 ttl pk-yrs)    Types: Cigarettes    Start date: 78    Quit date: 2012    Years since quitting: 12.7   Smokeless tobacco: Never  Vaping Use   Vaping status: Every Day   Substances: Nicotine   Devices: unsure name  Substance and Sexual Activity   Alcohol use: Yes    Comment: occasional beer   Drug use: Never   Sexual activity: Not on file    Tobacco Counseling Counseling given: Not Answered   Immunizations and Health Maintenance Immunization History  Administered Date(s) Administered   Influenza Inj Mdck Quad Pf 09/02/2015   Influenza,inj,Quad PF,6+ Mos 11/13/2020   Influenza-Unspecified 09/02/2015, 09/13/2018, 09/13/2019, 09/08/2021, 09/17/2022   PFIZER Comirnaty(Gray Top)Covid-19 Tri-Sucrose Vaccine 06/27/2021   PFIZER(Purple Top)SARS-COV-2 Vaccination 12/11/2019, 01/01/2020, 12/12/2020   PNEUMOCOCCAL CONJUGATE-20 09/15/2021   Pfizer Covid-19 Vaccine Bivalent Booster 32yrs & up  10/31/2021, 09/02/2022   RSV,unspecified 09/17/2022   Tdap 10/31/2015   Health Maintenance Due  Topic Date Due   Medicare Annual Wellness (AWV)  Never done   INFLUENZA VACCINE  07/15/2023   COVID-19 Vaccine (7 - 2023-24 season) 08/15/2023    Activities of Daily Living    08/24/2023    3:38 PM 07/08/2023  7:45 AM  In your present state of health, do you have any difficulty performing the following activities:  Hearing? 0 0  Vision? 0 0  Difficulty concentrating or making decisions? 1 0  Walking or climbing stairs? 1 0  Dressing or bathing? 0 0  Doing errands, shopping? 0   Preparing Food and eating ? Y   Comment some, related to pain   Using the Toilet? N   In the past six months, have you accidently leaked urine? N   Do you have problems with loss of bowel control? N   Managing your Medications? N   Managing your Finances? N   Housekeeping or managing your Housekeeping? N     Physical Exam : See separate note.   Advanced Directives: Does Patient Have a Medical Advance Directive?: Yes Type of Advance Directive: Living will, Healthcare Power of Attorney Does patient want to make changes to medical advance directive?: No - Patient declined Copy of Healthcare Power of Attorney in Chart?: No - copy requested    Assessment:    This is a routine wellness examination for this patient . In office EKG performed for Welcome to Medicare visit showing normal sinus rhythm, normal axis, rate of 71 with no acute changes.  Vision/Hearing screen No results found.   Goals      Patient Stated     Maintain normal weight over the next year.         Depression Screen    07/01/2023    4:03 PM 05/19/2022   11:30 AM 04/17/2022    3:12 PM 01/16/2022    4:38 PM  PHQ 2/9 Scores  PHQ - 2 Score 4 3 1 2   PHQ- 9 Score 8 7  11      Fall Risk    08/24/2023    3:41 PM  Fall Risk   Falls in the past year? 0  Follow up Falls evaluation completed    Cognitive Function:         08/24/2023    3:43 PM  6CIT Screen  What Year? 0 points  What month? 0 points  What time? 0 points  Count back from 20 0 points  Months in reverse 4 points  Repeat phrase 2 points  Total Score 6 points    Patient Care Team: Christen Butter, NP as PCP - General (Nurse Practitioner) Archer Asa, MD as Consulting Physician (Psychiatry) Rodolph Bong, MD as Consulting Physician (Sports Medicine) Benjiman Core, MD (Inactive) as Consulting Physician (Oncology) Willis Modena, MD as Consulting Physician (Gastroenterology)     Plan:    I have personally reviewed and noted the following in the patient's chart:   Medical and social history Use of alcohol, tobacco or illicit drugs  Current medications and supplements Functional ability and status Nutritional status Physical activity Advanced directives List of other physicians Hospitalizations, surgeries, and ER visits in previous 12 months Vitals Screenings to include cognitive, depression, and falls Referrals and appointments  In addition, I have reviewed and discussed with patient certain preventive protocols, quality metrics, and best practice recommendations. A written personalized care plan for preventive services as well as general preventive health recommendations were provided to patient.   Christen Butter, NP 08/24/2023

## 2023-08-25 DIAGNOSIS — C670 Malignant neoplasm of trigone of bladder: Secondary | ICD-10-CM | POA: Diagnosis not present

## 2023-08-25 DIAGNOSIS — C679 Malignant neoplasm of bladder, unspecified: Secondary | ICD-10-CM | POA: Diagnosis not present

## 2023-08-25 DIAGNOSIS — Z5112 Encounter for antineoplastic immunotherapy: Secondary | ICD-10-CM | POA: Diagnosis not present

## 2023-08-25 DIAGNOSIS — Z79899 Other long term (current) drug therapy: Secondary | ICD-10-CM | POA: Diagnosis not present

## 2023-08-26 ENCOUNTER — Encounter (HOSPITAL_COMMUNITY): Payer: Self-pay | Admitting: Radiology

## 2023-08-26 ENCOUNTER — Other Ambulatory Visit: Payer: Self-pay | Admitting: Medical-Surgical

## 2023-08-26 ENCOUNTER — Other Ambulatory Visit (HOSPITAL_COMMUNITY): Payer: Self-pay | Admitting: Interventional Radiology

## 2023-08-26 DIAGNOSIS — M1712 Unilateral primary osteoarthritis, left knee: Secondary | ICD-10-CM

## 2023-09-02 NOTE — Addendum Note (Signed)
Addended by: Chalmers Cater on: 09/02/2023 11:26 AM   Modules accepted: Orders

## 2023-09-09 ENCOUNTER — Telehealth: Payer: Self-pay | Admitting: Medical-Surgical

## 2023-09-09 ENCOUNTER — Other Ambulatory Visit: Payer: Self-pay | Admitting: Medical-Surgical

## 2023-09-09 NOTE — Telephone Encounter (Signed)
Pt saw her results.    Seen by patient Mary Stark on 09/09/2023 11:47 AM

## 2023-09-09 NOTE — Telephone Encounter (Signed)
Pt called. Not sure who called her about test results.

## 2023-09-15 DIAGNOSIS — Z5111 Encounter for antineoplastic chemotherapy: Secondary | ICD-10-CM | POA: Diagnosis not present

## 2023-09-15 DIAGNOSIS — Z8616 Personal history of COVID-19: Secondary | ICD-10-CM | POA: Diagnosis not present

## 2023-09-15 DIAGNOSIS — I10 Essential (primary) hypertension: Secondary | ICD-10-CM | POA: Diagnosis not present

## 2023-09-15 DIAGNOSIS — Z5112 Encounter for antineoplastic immunotherapy: Secondary | ICD-10-CM | POA: Diagnosis not present

## 2023-09-15 DIAGNOSIS — C679 Malignant neoplasm of bladder, unspecified: Secondary | ICD-10-CM | POA: Diagnosis not present

## 2023-09-15 DIAGNOSIS — R5383 Other fatigue: Secondary | ICD-10-CM | POA: Diagnosis not present

## 2023-09-15 DIAGNOSIS — C661 Malignant neoplasm of right ureter: Secondary | ICD-10-CM | POA: Diagnosis not present

## 2023-09-15 DIAGNOSIS — Z79899 Other long term (current) drug therapy: Secondary | ICD-10-CM | POA: Diagnosis not present

## 2023-10-06 DIAGNOSIS — I10 Essential (primary) hypertension: Secondary | ICD-10-CM | POA: Diagnosis not present

## 2023-10-06 DIAGNOSIS — R5383 Other fatigue: Secondary | ICD-10-CM | POA: Diagnosis not present

## 2023-10-06 DIAGNOSIS — C661 Malignant neoplasm of right ureter: Secondary | ICD-10-CM | POA: Diagnosis not present

## 2023-10-06 DIAGNOSIS — C679 Malignant neoplasm of bladder, unspecified: Secondary | ICD-10-CM | POA: Diagnosis not present

## 2023-10-06 DIAGNOSIS — Z5112 Encounter for antineoplastic immunotherapy: Secondary | ICD-10-CM | POA: Diagnosis not present

## 2023-10-06 DIAGNOSIS — Z5111 Encounter for antineoplastic chemotherapy: Secondary | ICD-10-CM | POA: Diagnosis not present

## 2023-10-06 DIAGNOSIS — Z8616 Personal history of COVID-19: Secondary | ICD-10-CM | POA: Diagnosis not present

## 2023-10-06 DIAGNOSIS — Z79899 Other long term (current) drug therapy: Secondary | ICD-10-CM | POA: Diagnosis not present

## 2023-10-13 DIAGNOSIS — Z960 Presence of urogenital implants: Secondary | ICD-10-CM | POA: Diagnosis not present

## 2023-10-13 DIAGNOSIS — R591 Generalized enlarged lymph nodes: Secondary | ICD-10-CM | POA: Diagnosis not present

## 2023-10-13 DIAGNOSIS — R590 Localized enlarged lymph nodes: Secondary | ICD-10-CM | POA: Diagnosis not present

## 2023-10-13 DIAGNOSIS — C679 Malignant neoplasm of bladder, unspecified: Secondary | ICD-10-CM | POA: Diagnosis not present

## 2023-10-13 DIAGNOSIS — N2889 Other specified disorders of kidney and ureter: Secondary | ICD-10-CM | POA: Diagnosis not present

## 2023-10-13 DIAGNOSIS — N1330 Unspecified hydronephrosis: Secondary | ICD-10-CM | POA: Diagnosis not present

## 2023-10-14 ENCOUNTER — Telehealth: Payer: Self-pay | Admitting: Medical-Surgical

## 2023-10-14 MED ORDER — SERTRALINE HCL 25 MG PO TABS: 25.0000 mg | ORAL_TABLET | Freq: Every day | ORAL | 1 refills | Status: DC

## 2023-10-14 NOTE — Telephone Encounter (Signed)
Patient called requesting a refill of ; sertraline (ZOLOFT) 25 MG tablet [[id number]]   Pharmacy ;  [id number] - [place], [place] - [address]

## 2023-10-14 NOTE — Telephone Encounter (Signed)
Sent 90 days supply with 1 Refill to the pharmacy requested

## 2023-10-19 DIAGNOSIS — N1831 Chronic kidney disease, stage 3a: Secondary | ICD-10-CM | POA: Diagnosis not present

## 2023-10-27 DIAGNOSIS — R5383 Other fatigue: Secondary | ICD-10-CM | POA: Diagnosis not present

## 2023-10-27 DIAGNOSIS — Z79899 Other long term (current) drug therapy: Secondary | ICD-10-CM | POA: Diagnosis not present

## 2023-10-27 DIAGNOSIS — Z5112 Encounter for antineoplastic immunotherapy: Secondary | ICD-10-CM | POA: Diagnosis not present

## 2023-10-27 DIAGNOSIS — C679 Malignant neoplasm of bladder, unspecified: Secondary | ICD-10-CM | POA: Diagnosis not present

## 2023-10-27 DIAGNOSIS — Z8616 Personal history of COVID-19: Secondary | ICD-10-CM | POA: Diagnosis not present

## 2023-11-02 ENCOUNTER — Other Ambulatory Visit (HOSPITAL_COMMUNITY): Payer: Self-pay

## 2023-11-04 ENCOUNTER — Encounter: Payer: Self-pay | Admitting: Oncology

## 2023-11-04 NOTE — Telephone Encounter (Signed)
Telephone call  

## 2023-11-10 DIAGNOSIS — N135 Crossing vessel and stricture of ureter without hydronephrosis: Secondary | ICD-10-CM | POA: Insufficient documentation

## 2023-11-10 DIAGNOSIS — N3281 Overactive bladder: Secondary | ICD-10-CM | POA: Diagnosis not present

## 2023-11-24 DIAGNOSIS — Z79899 Other long term (current) drug therapy: Secondary | ICD-10-CM | POA: Diagnosis not present

## 2023-11-24 DIAGNOSIS — Z5112 Encounter for antineoplastic immunotherapy: Secondary | ICD-10-CM | POA: Diagnosis not present

## 2023-11-24 DIAGNOSIS — C679 Malignant neoplasm of bladder, unspecified: Secondary | ICD-10-CM | POA: Diagnosis not present

## 2023-11-24 DIAGNOSIS — C661 Malignant neoplasm of right ureter: Secondary | ICD-10-CM | POA: Diagnosis not present

## 2023-11-30 DIAGNOSIS — N179 Acute kidney failure, unspecified: Secondary | ICD-10-CM | POA: Diagnosis not present

## 2023-11-30 DIAGNOSIS — F419 Anxiety disorder, unspecified: Secondary | ICD-10-CM | POA: Diagnosis not present

## 2023-11-30 DIAGNOSIS — Z8551 Personal history of malignant neoplasm of bladder: Secondary | ICD-10-CM | POA: Diagnosis not present

## 2023-11-30 DIAGNOSIS — C7919 Secondary malignant neoplasm of other urinary organs: Secondary | ICD-10-CM | POA: Diagnosis not present

## 2023-11-30 DIAGNOSIS — N183 Chronic kidney disease, stage 3 unspecified: Secondary | ICD-10-CM | POA: Diagnosis not present

## 2023-11-30 DIAGNOSIS — C679 Malignant neoplasm of bladder, unspecified: Secondary | ICD-10-CM | POA: Diagnosis not present

## 2023-11-30 DIAGNOSIS — C775 Secondary and unspecified malignant neoplasm of intrapelvic lymph nodes: Secondary | ICD-10-CM | POA: Diagnosis not present

## 2023-11-30 DIAGNOSIS — N131 Hydronephrosis with ureteral stricture, not elsewhere classified: Secondary | ICD-10-CM | POA: Diagnosis not present

## 2023-11-30 DIAGNOSIS — F32A Depression, unspecified: Secondary | ICD-10-CM | POA: Diagnosis not present

## 2023-11-30 DIAGNOSIS — I129 Hypertensive chronic kidney disease with stage 1 through stage 4 chronic kidney disease, or unspecified chronic kidney disease: Secondary | ICD-10-CM | POA: Diagnosis not present

## 2023-12-06 NOTE — Addendum Note (Signed)
Encounter addended by: Arby Barrette on: 12/06/2023 10:59 AM  Actions taken: Imaging Exam ended

## 2023-12-08 ENCOUNTER — Other Ambulatory Visit: Payer: Self-pay | Admitting: Medical-Surgical

## 2023-12-21 ENCOUNTER — Other Ambulatory Visit: Payer: Self-pay | Admitting: Interventional Radiology

## 2023-12-21 DIAGNOSIS — M1712 Unilateral primary osteoarthritis, left knee: Secondary | ICD-10-CM

## 2023-12-22 DIAGNOSIS — Z79899 Other long term (current) drug therapy: Secondary | ICD-10-CM | POA: Diagnosis not present

## 2023-12-22 DIAGNOSIS — Z5112 Encounter for antineoplastic immunotherapy: Secondary | ICD-10-CM | POA: Diagnosis not present

## 2023-12-22 DIAGNOSIS — C679 Malignant neoplasm of bladder, unspecified: Secondary | ICD-10-CM | POA: Diagnosis not present

## 2023-12-22 DIAGNOSIS — R5383 Other fatigue: Secondary | ICD-10-CM | POA: Diagnosis not present

## 2023-12-22 DIAGNOSIS — C675 Malignant neoplasm of bladder neck: Secondary | ICD-10-CM | POA: Diagnosis not present

## 2023-12-22 DIAGNOSIS — C661 Malignant neoplasm of right ureter: Secondary | ICD-10-CM | POA: Diagnosis not present

## 2023-12-28 ENCOUNTER — Ambulatory Visit (INDEPENDENT_AMBULATORY_CARE_PROVIDER_SITE_OTHER): Payer: Medicare HMO | Admitting: Medical-Surgical

## 2023-12-28 ENCOUNTER — Encounter: Payer: Self-pay | Admitting: Medical-Surgical

## 2023-12-28 VITALS — BP 125/73 | HR 76 | Resp 20 | Ht 66.0 in | Wt 124.3 lb

## 2023-12-28 DIAGNOSIS — C679 Malignant neoplasm of bladder, unspecified: Secondary | ICD-10-CM

## 2023-12-28 DIAGNOSIS — Z5111 Encounter for antineoplastic chemotherapy: Secondary | ICD-10-CM

## 2023-12-28 DIAGNOSIS — F410 Panic disorder [episodic paroxysmal anxiety] without agoraphobia: Secondary | ICD-10-CM

## 2023-12-28 DIAGNOSIS — C799 Secondary malignant neoplasm of unspecified site: Secondary | ICD-10-CM | POA: Diagnosis not present

## 2023-12-28 DIAGNOSIS — C779 Secondary and unspecified malignant neoplasm of lymph node, unspecified: Secondary | ICD-10-CM | POA: Diagnosis not present

## 2023-12-28 DIAGNOSIS — F341 Dysthymic disorder: Secondary | ICD-10-CM | POA: Diagnosis not present

## 2023-12-28 DIAGNOSIS — F1021 Alcohol dependence, in remission: Secondary | ICD-10-CM | POA: Diagnosis not present

## 2023-12-28 DIAGNOSIS — C669 Malignant neoplasm of unspecified ureter: Secondary | ICD-10-CM

## 2023-12-28 DIAGNOSIS — R4589 Other symptoms and signs involving emotional state: Secondary | ICD-10-CM | POA: Diagnosis not present

## 2023-12-28 MED ORDER — CLONAZEPAM 1 MG PO TABS: ORAL_TABLET | ORAL | 1 refills | Status: DC

## 2023-12-28 MED ORDER — GABAPENTIN 300 MG PO CAPS: ORAL_CAPSULE | ORAL | 1 refills | Status: DC

## 2023-12-28 MED ORDER — IPRATROPIUM BROMIDE 0.03 % NA SOLN: 2.0000 | Freq: Two times a day (BID) | NASAL | 2 refills | Status: AC

## 2023-12-28 MED ORDER — SERTRALINE HCL 50 MG PO TABS: 50.0000 mg | ORAL_TABLET | Freq: Every day | ORAL | 0 refills | Status: DC

## 2023-12-28 NOTE — Progress Notes (Signed)
 Subjective:  Patient ID: Mary Stark, female    DOB: Apr 01, 1956, 68 y.o.   MRN: [id number]  Patient Care Team: Willo Mini, NP as PCP - General (Nurse Practitioner) Tasia Lung, MD as Consulting Physician (Psychiatry) Joane Artist RAMAN, MD as Consulting Physician (Sports Medicine) Amadeo Windell SAILOR, MD (Inactive) as Consulting Physician (Oncology) Burnette Fallow, MD as Consulting Physician (Gastroenterology)   Chief Complaint:  Medication Refill  History, exam, impression, and plan:  Mary Stark is a 68 y.o. female presenting on 12/28/2023 for Medication Refill  1. Anxiety about health Ongoing treatment for cancer with frustrations around insurance coverage, continued treatment, etc. Currently taking sertraline  25mg  daily, Klonopin  0.5mg  during the day and 1mg  at night, and Wellbutrin  300mg  daily. Tolerating well without side effects. Reports more issues with depression right now. See below.   2. Persistent depressive disorder Pt recently lost her husband about a month ago. Pt states she just wants to get though the day without crying. Pt taking sertraline   clonazepam , bupropion  as noted above. Continues to struggle despite the medications. Denies SI/HI. Plan to increase Sertraline  (Zoloft ) to 50mg  PO daily. Continue Klonopin  and Wellbutrin  as prescribed.   3. History of alcohol dependence (HCC) Takes Gabapentin   (Neurontin ) for ETOH cravings and management of chemo related neuropathy. Pt states that she would like to start drinking again but cannot because of Chemo medications. Continue and refill Gabapentin  300mg  PO take 1 capsule daily and 4 capsules at HS.  4. Allergic Rhinorrhea Pt states that her cats are causing her nose to run without stopping but only when she is home. Pt states that none of the OTCs are working. Start ipatroprium (Atrovent ) 0.03% nasal spray 1 spray each nostril BID prn.   Relevant past medical, surgical, family, and social history reviewed and  updated as indicated.  Allergies and medications reviewed and updated. Data reviewed: Chart in Epic.   Past Medical History:  Diagnosis Date   Adenocarcinoma of bladder, stage 4 (HCC) 10/2020   oncologist--- dr amadeo and urologist-- dr pace;  w/ mets , started chemo 12/ 2021 and bilateral ureteral stents to treat hydronephrosis   Alcohol dependence (HCC)    drank heavily years ago doesn't now 10/22/2021   Anemia due to chemotherapy    Bilateral hydronephrosis    urologsit--- dr pace;   chronic issue,  treated with ureteral stents   CKD (chronic kidney disease), stage III (HCC)    Diverticulosis of colon    Encounter for antineoplastic chemotherapy    Family history of adverse reaction to anesthesia    father had a tight airway   GAD (generalized anxiety disorder)    GERD (gastroesophageal reflux disease)    History of 2019 novel coronavirus disease (COVID-19) 08/13/2020   positive result in epic 08-19-2020,  hospital admission in epic,  covid pneumonia with hypoxia respiratory failure, discharged with oxygen for 6 weeks   History of adenomatous polyp of colon    History of chemotherapy last done 05-13-2021   History of colitis 03/2018   acute colitis with sepsis   History of hepatitis C    completed treatment in 2012   History of kidney stones    History of rheumatic fever as a child    per pt no valvular issues   History of sepsis    11/ 2021hospital admission in epic due to pyelonephritis due to hydronephrosis, resolved//   urosepsis 2022   History of skin cancer    excision from nose  History of small bowel obstruction 05/25/2022   s/p  right hemicolectomy   Hyperlipidemia    Hypertension    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HCC)    MDD (major depressive disorder)    Metastasis from bladder cancer (HCC)    Neoplastic (malignant) related fatigue    OA (osteoarthritis)    Back and lt knee   Palliative care patient    Port-A-Cath in place 11/18/2020   Seasonal allergies     Vitamin D  deficiency    Wears glasses     Past Surgical History:  Procedure Laterality Date   ANAL RECTAL MANOMETRY N/A 01/31/2020   Procedure: ANO RECTAL MANOMETRY;  Surgeon: Burnette Fallow, MD;  Location: [facility];  Service: Endoscopy;  Laterality: N/A;   CESAREAN SECTION  1984   COLONOSCOPY WITH PROPOFOL   05/07/2022   [facility] outlaw   CYSTOSCOPY W/ URETERAL STENT PLACEMENT Bilateral 01/21/2021   Procedure: CYSTOSCOPY WITH STENT REPLACEMENT;  Surgeon: Elisabeth Valli BIRCH, MD;  Location: [facility];  Service: Urology;  Laterality: Bilateral;  1 HR   CYSTOSCOPY W/ URETERAL STENT PLACEMENT Bilateral 05/20/2021   Procedure: CYSTOSCOPY WITH RETROGRADE PYELOGRAM/URETERAL STENT EXCHANGE;  Surgeon: Elisabeth Valli BIRCH, MD;  Location: [facility];  Service: Urology;  Laterality: Bilateral;   CYSTOSCOPY W/ URETERAL STENT PLACEMENT Bilateral 10/28/2021   Procedure: CYSTOSCOPY WITH RETROGRADE PYELOGRAM/URETERAL STENT EXCHANGE;  Surgeon: Elisabeth Valli BIRCH, MD;  Location: [facility];  Service: Urology;  Laterality: Bilateral;   CYSTOSCOPY W/ URETERAL STENT PLACEMENT Bilateral 04/28/2022   Procedure: CYSTOSCOPY SAMUEL STENT EXCHANGE;  Surgeon: Elisabeth Valli BIRCH, MD;  Location: [facility];  Service: Urology;  Laterality: Bilateral;  1 HR   CYSTOSCOPY W/ URETERAL STENT PLACEMENT Bilateral 11/17/2022   Procedure: CYSTOSCOPY WITH RETROGRADE PYELOGRAM/URETERAL STENT REPLACEMENT;  Surgeon: Elisabeth Valli BIRCH, MD;  Location: [facility];  Service: Urology;  Laterality: Bilateral;   CYSTOSCOPY WITH RETROGRADE PYELOGRAM, URETEROSCOPY AND STENT PLACEMENT Bilateral 06/11/2020   Procedure: CYSTOSCOPY WITH RETROGRADE PYELOGRAM, URETEROSCOPY AND STENT PLACEMENT, RIGHT URETERAL BIOPSY;  Surgeon: Elisabeth Valli BIRCH, MD;  Location: [facility];  Service: Urology;  Laterality: Bilateral;  90 MINS   CYSTOSCOPY WITH RETROGRADE PYELOGRAM,  URETEROSCOPY AND STENT PLACEMENT Bilateral 07/05/2020   Procedure: CYSTOSCOPY WITH RETROGRADE PYELOGRAM, URETEROSCOPY,  BIOPSIES AND STENT EXCHANGES;  Surgeon: Elisabeth Valli BIRCH, MD;  Location: [facility];  Service: Urology;  Laterality: Bilateral;   FINGER SURGERY Right    5th   GYNECOLOGIC CRYOSURGERY  YRS AGO   IR 3D INDEPENDENT WKST  07/08/2023   IR ANGIOGRAM EXTREMITY LEFT  07/08/2023   IR ANGIOGRAM SELECTIVE EACH ADDITIONAL VESSEL  07/08/2023   IR ANGIOGRAM SELECTIVE EACH ADDITIONAL VESSEL  07/08/2023   IR EMBO ARTERIAL NOT HEMORR HEMANG INC GUIDE ROADMAPPING  07/08/2023   IR IMAGING GUIDED PORT INSERTION  11/18/2020   IR RADIOLOGIST EVAL & MGMT  06/09/2023   IR RADIOLOGIST EVAL & MGMT  08/20/2023   IR US  GUIDE VASC ACCESS LEFT  07/08/2023   PARTIAL COLECTOMY Right 05/25/2022   Procedure: EXPLORATORY LAPAROTOMY; RIGHT HEMI COLECTOMY; PARTIAL OMENTECTOMY;  Surgeon: Belinda Cough, MD;  Location: [facility];  Service: General;  Laterality: Right;   TONSILLECTOMY     chld   TRANSURETHRAL RESECTION OF BLADDER TUMOR  01/21/2021   Procedure: TRANSURETHRAL RESECTION OF BLADDER TUMOR (TURBT);  Surgeon: Elisabeth Valli BIRCH, MD;  Location: [facility];  Service: Urology;;    Social History   Socioeconomic History  Marital status: Married    Spouse name: Not on file   Number of children: Not on file   Years of education: Not on file   Highest education level: Not on file  Occupational History   Not on file  Tobacco Use   Smoking status: Former    Current packs/day: 0.00    Average packs/day: 1 pack/day for 25.0 years (25.0 ttl pk-yrs)    Types: Cigarettes    Start date: 58    Quit date: 2012    Years since quitting: 13.0   Smokeless tobacco: Never  Vaping Use   Vaping status: Every Day   Substances: Nicotine    Devices: unsure name  Substance and Sexual Activity   Alcohol use: Yes    Comment: occasional beer   Drug use: Never   Sexual activity: Not on  file  Other Topics Concern   Not on file  Social History Narrative   Not on file   Social Drivers of Health   Financial Resource Strain: Low Risk  (06/21/2023)   Received from [facility]   Overall Financial Resource Strain (CARDIA)    Difficulty of Paying Living Expenses: Not hard at all  Food Insecurity: Low Risk  (10/19/2023)   Received from [facility]   Hunger Vital Sign    Worried About Running Out of Food in the Last Year: Never true    Ran Out of Food in the Last Year: Never true  Transportation Needs: No Transportation Needs (10/19/2023)   Received from [facility]    In the past 12 months, has lack of reliable transportation kept you from medical appointments, meetings, work or from getting things needed for daily living? : No  Physical Activity: Insufficiently Active (06/21/2023)   Received from [facility]   Exercise Vital Sign    Days of Exercise per Week: 2 days    Minutes of Exercise per Session: 30 min  Stress: Stress Concern Present (06/21/2023)   Received from [facility] - Occupational Stress Questionnaire    Feeling of Stress : To some extent  Social Connections: Moderately Integrated (06/21/2023)   Received from [facility]   Social Network    How would you rate your social network (family, work, friends)?: Adequate participation with social networks  Intimate Partner Violence: Not At Risk (06/21/2023)   Received from [facility]   HITS    Over the last 12 months how often did your partner physically hurt you?: Never    Over the last 12 months how often did your partner insult you or talk down to you?: Never    Over the last 12 months how often did your partner threaten you with physical harm?: Never    Over the last 12 months how often did your partner scream or curse at you?: Never    Outpatient Encounter Medications as of 12/28/2023  Medication Sig   albuterol  (PROAIR  HFA) 108 (90 Base)  MCG/ACT inhaler INHALE 1 TO 2 PUFFS BY MOUTH INTO THE LUNGS EVERY 4 HOURS AS NEEDED FOR WHEEZING OR SHORTNESS OF BREATH   ARIPiprazole  (ABILIFY ) 10 MG tablet TAKE 1 TABLET AT BEDTIME   buPROPion  (WELLBUTRIN  XL) 300 MG 24 hr tablet TAKE 1 TABLET EVERY DAY   clonazePAM  (KLONOPIN ) 1 MG tablet May take 0.5 tablets (0.5 mg total) by mouth daily as needed. May also take 1 tablet (1 mg total) at bedtime as needed.   fexofenadine  (FT ALLERGY  RELIEF 24 HOUR) 180 MG tablet Take 1 tablet (180 mg total) by mouth daily.   gabapentin  (NEURONTIN ) 300 MG capsule TAKE 1 CAPSULE DAILY AND 4 CAPSULES AT BEDTIME AS DIRECTED.   lidocaine -prilocaine  (EMLA ) cream Apply 1 application topically as needed. (Patient taking differently: Apply 1 application  topically as needed (for port access).)   Meth-Hyo-M Bl-Na Phos-Ph Sal (URIBEL ) 118 MG CAPS Take one capsule by mouth 3 times daily (Patient taking differently: as needed (spasms).)   nebivolol  (BYSTOLIC ) 10 MG tablet TAKE 1 TABLET EVERY DAY (Patient taking differently: Take 5 mg by mouth daily.)   ondansetron  (ZOFRAN ) 4 MG tablet Take 1 tablet (4 mg total) by mouth every 8 (eight) hours as needed for nausea or vomiting.   phenazopyridine  (PYRIDIUM ) 200 MG tablet Take 200 mg by mouth 3 (three) times daily.   phentermine 37.5 MG capsule Take 37.5 mg by mouth every morning.   sertraline  (ZOLOFT ) 25 MG tablet Take 1 tablet (25 mg total) by mouth daily.   simvastatin  (ZOCOR ) 20 MG tablet Take 1 tablet (20 mg total) by mouth daily. Needs lab work.   tamsulosin  (FLOMAX ) 0.4 MG CAPS capsule Take 1 capsule (0.4 mg total) by mouth at bedtime.   traMADol  (ULTRAM ) 50 MG tablet Take 1-2 tablets (50-100 mg total) by mouth 3 (three) times daily as needed.   [DISCONTINUED] MOUNJARO  5 MG/0.5ML Pen Inject into the skin.   [DISCONTINUED] naproxen  (NAPROSYN ) 500 MG tablet Take 1 tablet (500 mg total) by mouth 2 (two) times daily with a meal.   No facility-administered encounter medications  on file as of 12/28/2023.    Allergies  Allergen Reactions   Penicillins Hives and Rash    Reports hives to penicillin and amoxicillin as an adult a long time ago. Has tolerated cefdinir  (09/2020) and cefepime  (10/2020) before.   Metformin  Diarrhea    Extreme diarrhea and lactic acidosis    Review of Systems  Constitutional: Negative.   HENT:  Positive for rhinorrhea. Negative for sinus pressure and sinus pain.   Eyes: Negative.   Respiratory: Negative.    Cardiovascular: Negative.   Gastrointestinal: Negative.   Endocrine: Negative.   Genitourinary:        Bladder CA  Musculoskeletal: Negative.   Skin: Negative.   Allergic/Immunologic: Negative.   Neurological: Negative.   Hematological: Negative.   Psychiatric/Behavioral: Negative.         Objective:  BP 125/73   Pulse 76   Resp 20   Ht 5' 6 (1.676 m)   Wt 124 lb 4.8 oz (56.4 kg)   SpO2 97%   BMI 20.06 kg/m    Wt Readings from Last 3 Encounters:  12/28/23 124 lb 4.8 oz (56.4 kg)  08/24/23 132 lb (59.9 kg)  07/08/23 134 lb 6.4 oz (61 kg)    Physical Exam Constitutional:      Appearance: Normal appearance.  HENT:     Head: Normocephalic.     Nose: Nose normal.     Mouth/Throat:     Mouth: Mucous membranes are moist.  Eyes:     Pupils: Pupils are equal, round, and reactive to light.  Cardiovascular:     Rate and Rhythm: Normal rate and regular rhythm.     Pulses: Normal pulses.     Heart sounds: Normal heart sounds.  Musculoskeletal:        General: Normal range of motion.  Skin:    General: Skin is warm and dry.     Capillary Refill: Capillary  refill takes less than 2 seconds.  Neurological:     General: No focal deficit present.     Mental Status: She is alert and oriented to person, place, and time. Mental status is at baseline.  Psychiatric:        Thought Content: Thought content normal.        Judgment: Judgment normal.     Comments: Intermittently tearful        Pertinent labs &  imaging results that were available during my care of the patient were reviewed by me and considered in my medical decision making.  Assessment & Plan:    Follow up plan: Return in about 4 weeks (around 01/25/2024) for mood follow up.  Continue healthy lifestyle choices, including diet (rich in fruits, vegetables, and lean proteins, and low in salt and simple carbohydrates) and exercise (at least 30 minutes of moderate physical activity daily).   The above assessment and management plan was discussed with the patient. The patient verbalized understanding of and has agreed to the management plan. Patient is aware to call the clinic if they develop any new symptoms or if symptoms persist or worsen. Patient is aware when to return to the clinic for a follow-up visit. Patient educated on when it is appropriate to go to the emergency department.   Nancyann Huh  Student AGNP

## 2023-12-28 NOTE — Progress Notes (Deleted)
        Established patient visit  History, exam, impression, and plan:  1. LUQ pain Pleasant 68 year old female presenting today with reports of LUQ pain that is described as a dull nagging ache. Feels similar to pain that she has experienced from her gall bladder. Known history of gall stones but has not had her gall bladder removed. LUQ pain is worse with eating fatty, greasy foods. Occasional nausea but no vomiting. No change in bowel habits. No fever/chills. Exam benign with no reproducible tenderness. No HSM. Bowel sounds present throughout. Unclear etiology. Differentials include but not limited to referred pain from gall bladder, splenic flexure syndrome, spleen etiology, pancreatic inflammation, or constipation. Checking labs as below. Getting CT abd/pelvis for further evaluation.  - CBC with Differential/Platelet - CMP14+EGFR - Lipase - Amylase - CT ABDOMEN PELVIS WO CONTRAST; Future  2. Moderate persistent asthma, unspecified whether complicated Hx of asthma currently well controlled. Refilling Flovent  inhaler per patient request.  - fluticasone  (FLOVENT  HFA) 110 MCG/ACT inhaler; INHALE 2 PUFFS INTO THE LUNGS 2 (TWO) TIMES DAILY. FOR COUGH  Dispense: 12 each; Refill: 3   Procedures performed this visit: None.  Return if symptoms worsen or fail to improve.  __________________________________ Zada FREDRIK Palin, DNP, APRN, FNP-BC Primary Care and Sports Medicine [facility] [place]

## 2023-12-29 ENCOUNTER — Encounter: Payer: Self-pay | Admitting: Medical-Surgical

## 2023-12-29 NOTE — Progress Notes (Signed)
Reason for follow up:  Status post left geniculate artery embolization 07/08/23.   Referring Physician(s): Monica Becton  History of present illness: HPI from last clinic visit 08/20/23 Mary Stark TEE is a 68 y.o. female with a medical history significant for advanced stage bladder cancer currently on Keytruda, HTN, alcohol/tobacco use, hepatitis C, CKD, anxiety/depression, hydronephrosis s/p bilateral stents (2021) and bladder cancer. She is familiar to IR from a lymph node biopsy and port-a-catheter placement 2021. She also has a history of bilateral knee osteoarthritis and has had steroid injections and the Orthovisc series which she finished a few months ago. The right knee did well but the left knee continues to hurt. The patient is not interested in arthroplasty.  She was referred for genicular RFA but was denied by insurance.    She has now been referred to IR to discuss genicular artery embolization.    Her left knee pain waxes and wanes, and is most severe when walking and turning. The pain occasionally wakes her up at night when it is severe.  Given her advanced stage bladder cancer diagnosis, she is against having surgery to deal with her knee pain and wants to pursue a minimally invasive option.   Womac Pain Score = 46/96 VAS Pain Score = 3/10   She underwent left geniculate artery embolization on 07/08/23.  She's states that the first month after the procedure she had significantly improved symptoms, however has developed some recurrent pain after climbing the bleachers at her family member's soccer game recently.  She denies skin changes.  She states that the naproxen after the procedure affected her renal function and potassium excretion.     Womac Pain Score = 21/96 VAS Pain Score = 4/10  She had recovered well with improved WOMAC pain score from 46/96 to 21/96. She stated that the pain had crept back some, possibly due to a new injury climbing bleachers. She planned to  reach out to see Dr. Benjamin Stain again and we made plans to follow up in 3 months.    Today she reports her WOMAC score as 12/96.  Her VAS pain score is 2/10.  Still taking ultram occasionally.  She is very happy with her results.  She can still feel some popping in her knee, but it's essentially pain free.  She is able to go up and down stairs normally now.  She has some new mild pain in the anterior knee.   Past Medical History:  Diagnosis Date   Adenocarcinoma of bladder, stage 4 (HCC) 10/2020   oncologist--- dr Clelia Croft and urologist-- dr pace;  w/ mets , started chemo 12/ 2021 and bilateral ureteral stents to treat hydronephrosis   Alcohol dependence (HCC)    drank heavily years ago doesn't now 10/22/2021   Anemia due to chemotherapy    Bilateral hydronephrosis    urologsit--- dr pace;   chronic issue,  treated with ureteral stents   CKD (chronic kidney disease), stage III (HCC)    Diverticulosis of colon    Encounter for antineoplastic chemotherapy    Family history of adverse reaction to anesthesia    father had a "tight" airway   GAD (generalized anxiety disorder)    GERD (gastroesophageal reflux disease)    History of 2019 novel coronavirus disease (COVID-19) 08/13/2020   positive result in epic 08-19-2020,  hospital admission in epic,  covid pneumonia with hypoxia respiratory failure, discharged with oxygen for 6 weeks   History of adenomatous polyp of colon  History of chemotherapy last done 05-13-2021   History of colitis 03/2018   acute colitis with sepsis   History of hepatitis C    completed treatment in 2012   History of kidney stones    History of rheumatic fever as a child    per pt no valvular issues   History of sepsis    11/ 2021hospital admission in epic due to pyelonephritis due to hydronephrosis, resolved//   urosepsis 2022   History of skin cancer    excision from nose   History of small bowel obstruction 05/25/2022   s/p  right hemicolectomy    Hyperlipidemia    Hypertension    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HCC)    MDD (major depressive disorder)    Metastasis from bladder cancer (HCC)    Neoplastic (malignant) related fatigue    OA (osteoarthritis)    Back and lt knee   Palliative care patient    Port-A-Cath in place 11/18/2020   Seasonal allergies    Vitamin D deficiency    Wears glasses     Past Surgical History:  Procedure Laterality Date   ANAL RECTAL MANOMETRY N/A 01/31/2020   Procedure: ANO RECTAL MANOMETRY;  Surgeon: Willis Modena, MD;  Location: [facility];  Service: Endoscopy;  Laterality: N/A;   CESAREAN SECTION  1984   COLONOSCOPY WITH PROPOFOL  05/07/2022   [facility]--- dr outlaw   CYSTOSCOPY W/ URETERAL STENT PLACEMENT Bilateral 01/21/2021   Procedure: CYSTOSCOPY WITH STENT REPLACEMENT;  Surgeon: Noel Christmas, MD;  [facility];  Service: Urology;  Laterality: Bilateral;  1 HR   CYSTOSCOPY W/ URETERAL STENT PLACEMENT Bilateral 05/20/2021   Procedure: CYSTOSCOPY WITH RETROGRADE PYELOGRAM/URETERAL STENT EXCHANGE;  Surgeon: Noel Christmas, MD;  Location: [facility];  Service: Urology;  Laterality: Bilateral;   CYSTOSCOPY W/ URETERAL STENT PLACEMENT Bilateral 10/28/2021   Procedure: CYSTOSCOPY WITH RETROGRADE PYELOGRAM/URETERAL STENT EXCHANGE;  Surgeon: Noel Christmas, MD;  Location: [facility];  Service: Urology;  Laterality: Bilateral;   CYSTOSCOPY W/ URETERAL STENT PLACEMENT Bilateral 04/28/2022   Procedure: CYSTOSCOPY Otilio Miu STENT EXCHANGE;  Surgeon: Noel Christmas, MD;  Location[facility];  Service: Urology;  Laterality: Bilateral;  1 HR   CYSTOSCOPY W/ URETERAL STENT PLACEMENT Bilateral 11/17/2022   Procedure: CYSTOSCOPY WITH RETROGRADE PYELOGRAM/URETERAL STENT REPLACEMENT;  Surgeon: Noel Christmas, MD;  Location: [facility];  Service: Urology;  Laterality: Bilateral;   CYSTOSCOPY WITH RETROGRADE  PYELOGRAM, URETEROSCOPY AND STENT PLACEMENT Bilateral 06/11/2020   Procedure: CYSTOSCOPY WITH RETROGRADE PYELOGRAM, URETEROSCOPY AND STENT PLACEMENT, RIGHT URETERAL BIOPSY;  Surgeon: Noel Christmas, MD;  Location: [facility];  Service: Urology;  Laterality: Bilateral;  90 MINS   CYSTOSCOPY WITH RETROGRADE PYELOGRAM, URETEROSCOPY AND STENT PLACEMENT Bilateral 07/05/2020   Procedure: CYSTOSCOPY WITH RETROGRADE PYELOGRAM, URETEROSCOPY,  BIOPSIES AND STENT EXCHANGES;  Surgeon: Noel Christmas, MD;  Location: [facility];  Service: Urology;  Laterality: Bilateral;   FINGER SURGERY Right    5th   GYNECOLOGIC CRYOSURGERY  YRS AGO   IR 3D INDEPENDENT WKST  07/08/2023   IR ANGIOGRAM EXTREMITY LEFT  07/08/2023   IR ANGIOGRAM SELECTIVE EACH ADDITIONAL VESSEL  07/08/2023   IR ANGIOGRAM SELECTIVE EACH ADDITIONAL VESSEL  07/08/2023   IR EMBO ARTERIAL NOT HEMORR HEMANG INC GUIDE ROADMAPPING  07/08/2023   IR IMAGING GUIDED PORT INSERTION  11/18/2020   IR RADIOLOGIST EVAL & MGMT  06/09/2023   IR RADIOLOGIST EVAL & MGMT  08/20/2023   IR US GUIDE VASC ACCESS LEFT  07/08/2023   PARTIAL COLECTOMY Right 05/25/2022   Procedure: EXPLORATORY LAPAROTOMY; RIGHT HEMI COLECTOMY; PARTIAL OMENTECTOMY;  Surgeon: Manus Rudd, MD;  Location: [facility];  Service: General;  Laterality: Right;   TONSILLECTOMY     chld   TRANSURETHRAL RESECTION OF BLADDER TUMOR  01/21/2021   Procedure: TRANSURETHRAL RESECTION OF BLADDER TUMOR (TURBT);  Surgeon: Noel Christmas, MD;  Location: [facility];  Service: Urology;;    Allergies: Penicillins and Metformin  Medications: Prior to Admission medications   Medication Sig Start Date End Date Taking? Authorizing Provider  albuterol (PROAIR HFA) 108 (90 Base) MCG/ACT inhaler INHALE 1 TO 2 PUFFS BY MOUTH INTO THE LUNGS EVERY 4 HOURS AS NEEDED FOR WHEEZING OR SHORTNESS OF BREATH 02/16/23   Christen Butter, NP  ARIPiprazole (ABILIFY) 10 MG tablet TAKE 1 TABLET AT BEDTIME  02/19/23   Christen Butter, NP  buPROPion (WELLBUTRIN XL) 300 MG 24 hr tablet TAKE 1 TABLET EVERY DAY 08/30/23   Christen Butter, NP  clonazePAM Scarlette Calico) 1 MG tablet May take 0.5 tablets (0.5 mg total) by mouth daily as needed. May also take 1 tablet (1 mg total) at bedtime as needed. 12/28/23   Christen Butter, NP  fexofenadine (FT ALLERGY RELIEF 24 HOUR) 180 MG tablet Take 1 tablet (180 mg total) by mouth daily. 07/14/23   Christen Butter, NP  gabapentin (NEURONTIN) 300 MG capsule TAKE 1 CAPSULE DAILY AND 4 CAPSULES AT BEDTIME AS DIRECTED. 12/28/23   Christen Butter, NP  ipratropium (ATROVENT) 0.03 % nasal spray Place 2 sprays into both nostrils every 12 (twelve) hours. 12/28/23   Christen Butter, NP  lidocaine-prilocaine (EMLA) cream Apply 1 application topically as needed. Patient taking differently: Apply 1 application  topically as needed (for port access). 10/16/21   Benjiman Core, MD  Meth-Hyo-M Bl-Na Phos-Ph Sal (URIBEL) 118 MG CAPS Take one capsule by mouth 3 times daily Patient taking differently: as needed (spasms). 06/06/21     nebivolol (BYSTOLIC) 10 MG tablet TAKE 1 TABLET EVERY DAY Patient taking differently: Take 5 mg by mouth daily. 04/16/23   Christen Butter, NP  ondansetron (ZOFRAN) 4 MG tablet Take 1 tablet (4 mg total) by mouth every 8 (eight) hours as needed for nausea or vomiting. 04/30/21   Benjiman Core, MD  phenazopyridine (PYRIDIUM) 200 MG tablet Take 200 mg by mouth 3 (three) times daily. 07/01/23   [provider]  phentermine 37.5 MG capsule Take 37.5 mg by mouth every morning.    [provider]  sertraline (ZOLOFT) 50 MG tablet Take 1 tablet (50 mg total) by mouth daily. 12/28/23   Christen Butter, NP  simvastatin (ZOCOR) 20 MG tablet Take 1 tablet (20 mg total) by mouth daily. Needs lab work. 04/01/23   Christen Butter, NP  tamsulosin (FLOMAX) 0.4 MG CAPS capsule Take 1 capsule (0.4 mg total) by mouth at bedtime. 09/22/22     traMADol (ULTRAM) 50 MG tablet Take 1-2 tablets (50-100 mg total)  by mouth 3 (three) times daily as needed. 07/30/23   Monica Becton, MD     Family History  Problem Relation Age of Onset   Ovarian cancer Mother    Cardiomyopathy Father    Valvular heart disease Father    Liver cancer Brother    Protein C deficiency Brother    Protein C deficiency Brother    Stroke Brother     Social History   Socioeconomic History  Marital status: Married    Spouse name: Not on file   Number of children: Not on file   Years of education: Not on file   Highest education level: Not on file  Occupational History   Not on file  Tobacco Use   Smoking status: Former    Current packs/day: 0.00    Average packs/day: 1 pack/day for 25.0 years (25.0 ttl pk-yrs)    Types: Cigarettes    Start date: 23    Quit date: 2012    Years since quitting: 13.0   Smokeless tobacco: Never  Vaping Use   Vaping status: Every Day   Substances: Nicotine   Devices: unsure name  Substance and Sexual Activity   Alcohol use: Yes    Comment: occasional beer   Drug use: Never   Sexual activity: Not on file  Other Topics Concern   Not on file  Social History Narrative   Not on file   Social Drivers of Health   Financial Resource Strain: Low Risk  (06/21/2023)   Received from [facility]   Overall Financial Resource Strain (CARDIA)    Difficulty of Paying Living Expenses: Not hard at all  Food Insecurity: Low Risk  (10/19/2023)   Received from [facility]   Hunger Vital Sign    Worried About Running Out of Food in the Last Year: Never true    Ran Out of Food in the Last Year: Never true  Transportation Needs: No Transportation Needs (10/19/2023)   Received from [facility]    In the past 12 months, has lack of reliable transportation kept you from medical appointments, meetings, work or from getting things needed for daily living? : No  Physical Activity: Insufficiently Active (06/21/2023)   Received from [facility]   Exercise Vital Sign     Days of Exercise per Week: 2 days    Minutes of Exercise per Session: 30 min  Stress: Stress Concern Present (06/21/2023)   Received from [facility] - Occupational Stress Questionnaire    Feeling of Stress : To some extent  Social Connections: Moderately Integrated (06/21/2023)   Received from [facility]   Social Network    How would you rate your social network (family, work, friends)?: Adequate participation with social networks     Vital Signs: There were no vitals taken for this visit.  No physical examination was performed in lieu of virtual telephone clinic visit.  Imaging: Left knee 11/27/20  Kellgren and Lyman Bishop grade 3 (Moderate)   Left GAE (07/08/23)     0.5 mL dilute 100-300 micron embospheres administered into articular branch of left descending geniculate artery    Labs:  CBC: Recent Labs    07/08/23 0809  WBC 5.6  HGB 10.1*  HCT 31.9*  PLT 184    COAGS: Recent Labs    07/08/23 0809  INR 1.1    BMP: Recent Labs    07/08/23 0809  NA 130*  K 4.0  CL 98  CO2 22  GLUCOSE 80  BUN 14  CALCIUM 8.9  CREATININE 1.10*  GFRNONAA 56*    LIVER FUNCTION TESTS: No results for input(s): "BILITOT", "AST", "ALT", "ALKPHOS", "PROT", "ALBUMIN" in the last 8760 hours.  Assessment and Plan: 68 year old female with history of severe left knee pain secondary to osteoarthritis (K&G grade 3) with lifestyle limiting pain and discomfort refractory to conservative measures including sustained release steroid injections and hyaluronic acid injections. She  has advanced stage bladder cancer and wished to pursue minimally invasive options to alleviate her knee pain, therefore underwent technically successful left GAE on 07/08/23. Her WOMAC score has decreased from 46/96 to 12/96.  She is very pleased with the results.  Plan to follow up in IR clinic in 6 months.  Marliss Coots, MD Pager: [phone]    I spent a  total of 40 Minutes in face to face in clinical consultation, greater than 50% of which was counseling/coordinating care for left knee pain.

## 2023-12-29 NOTE — Progress Notes (Signed)
 Medical screening examination/treatment was performed by qualified clinical staff member and as supervising provider I was immediately available for consultation/collaboration. I have reviewed documentation and agree with assessment and plan.  Additional documentation: Discussed current health concerns, treatments, pain levels, and unclear future plan. She is not currently under the care of a palliative care provider. Do not feel that she is appropriate for hospice but palliative may be a good resource. Patient is agreeable so referral placed today.   Zada FREDRIK Palin, DNP, APRN, FNP-BC [facility] and Sports Medicine

## 2023-12-31 ENCOUNTER — Ambulatory Visit
Admission: RE | Admit: 2023-12-31 | Discharge: 2023-12-31 | Disposition: A | Discharge status: Home / Self Care | Payer: Medicare HMO | Source: Ambulatory Visit | Attending: Interventional Radiology | Admitting: Interventional Radiology

## 2023-12-31 DIAGNOSIS — M1712 Unilateral primary osteoarthritis, left knee: Secondary | ICD-10-CM | POA: Diagnosis not present

## 2023-12-31 DIAGNOSIS — M25561 Pain in right knee: Secondary | ICD-10-CM | POA: Diagnosis not present

## 2023-12-31 DIAGNOSIS — M25562 Pain in left knee: Secondary | ICD-10-CM | POA: Diagnosis not present

## 2023-12-31 HISTORY — PX: IR RADIOLOGIST EVAL & MGMT: IMG5224

## 2024-01-10 DIAGNOSIS — H40013 Open angle with borderline findings, low risk, bilateral: Secondary | ICD-10-CM | POA: Diagnosis not present

## 2024-01-10 DIAGNOSIS — H04123 Dry eye syndrome of bilateral lacrimal glands: Secondary | ICD-10-CM | POA: Diagnosis not present

## 2024-01-10 DIAGNOSIS — H524 Presbyopia: Secondary | ICD-10-CM | POA: Diagnosis not present

## 2024-01-10 DIAGNOSIS — H25813 Combined forms of age-related cataract, bilateral: Secondary | ICD-10-CM | POA: Diagnosis not present

## 2024-01-10 DIAGNOSIS — H353131 Nonexudative age-related macular degeneration, bilateral, early dry stage: Secondary | ICD-10-CM | POA: Diagnosis not present

## 2024-01-12 DIAGNOSIS — Z79899 Other long term (current) drug therapy: Secondary | ICD-10-CM | POA: Diagnosis not present

## 2024-01-12 DIAGNOSIS — C661 Malignant neoplasm of right ureter: Secondary | ICD-10-CM | POA: Diagnosis not present

## 2024-01-12 DIAGNOSIS — C679 Malignant neoplasm of bladder, unspecified: Secondary | ICD-10-CM | POA: Diagnosis not present

## 2024-01-12 DIAGNOSIS — Z5112 Encounter for antineoplastic immunotherapy: Secondary | ICD-10-CM | POA: Diagnosis not present

## 2024-01-12 DIAGNOSIS — C675 Malignant neoplasm of bladder neck: Secondary | ICD-10-CM | POA: Diagnosis not present

## 2024-01-12 DIAGNOSIS — R5383 Other fatigue: Secondary | ICD-10-CM | POA: Diagnosis not present

## 2024-01-23 ENCOUNTER — Other Ambulatory Visit: Payer: Self-pay | Admitting: Medical-Surgical

## 2024-01-25 ENCOUNTER — Ambulatory Visit: Payer: Medicare HMO | Admitting: Medical-Surgical

## 2024-01-27 DIAGNOSIS — R590 Localized enlarged lymph nodes: Secondary | ICD-10-CM | POA: Diagnosis not present

## 2024-01-27 DIAGNOSIS — C678 Malignant neoplasm of overlapping sites of bladder: Secondary | ICD-10-CM | POA: Diagnosis not present

## 2024-01-27 DIAGNOSIS — C679 Malignant neoplasm of bladder, unspecified: Secondary | ICD-10-CM | POA: Diagnosis not present

## 2024-01-27 DIAGNOSIS — N1339 Other hydronephrosis: Secondary | ICD-10-CM | POA: Diagnosis not present

## 2024-01-27 DIAGNOSIS — N1330 Unspecified hydronephrosis: Secondary | ICD-10-CM | POA: Diagnosis not present

## 2024-02-02 DIAGNOSIS — C679 Malignant neoplasm of bladder, unspecified: Secondary | ICD-10-CM | POA: Diagnosis not present

## 2024-02-02 DIAGNOSIS — C670 Malignant neoplasm of trigone of bladder: Secondary | ICD-10-CM | POA: Diagnosis not present

## 2024-02-02 DIAGNOSIS — Z5112 Encounter for antineoplastic immunotherapy: Secondary | ICD-10-CM | POA: Diagnosis not present

## 2024-02-02 DIAGNOSIS — Z79899 Other long term (current) drug therapy: Secondary | ICD-10-CM | POA: Diagnosis not present

## 2024-02-08 ENCOUNTER — Ambulatory Visit (INDEPENDENT_AMBULATORY_CARE_PROVIDER_SITE_OTHER): Payer: Medicare HMO | Admitting: Sports Medicine

## 2024-02-08 ENCOUNTER — Encounter: Payer: Self-pay | Admitting: Sports Medicine

## 2024-02-08 ENCOUNTER — Other Ambulatory Visit (INDEPENDENT_AMBULATORY_CARE_PROVIDER_SITE_OTHER): Payer: Self-pay

## 2024-02-08 DIAGNOSIS — M170 Bilateral primary osteoarthritis of knee: Secondary | ICD-10-CM | POA: Diagnosis not present

## 2024-02-08 DIAGNOSIS — M19032 Primary osteoarthritis, left wrist: Secondary | ICD-10-CM

## 2024-02-08 DIAGNOSIS — S6991XD Unspecified injury of right wrist, hand and finger(s), subsequent encounter: Secondary | ICD-10-CM

## 2024-02-08 MED ORDER — TRIAMCINOLONE ACETONIDE 40 MG/ML IJ SUSP
40.0000 mg | Freq: Once | INTRAMUSCULAR | Status: AC
  Administered 2024-02-08: 40 mg via INTRAMUSCULAR

## 2024-02-08 MED ORDER — HYDROCODONE-ACETAMINOPHEN 10-325 MG PO TABS: 1.0000 | ORAL_TABLET | Freq: Two times a day (BID) | ORAL | 0 refills | Status: DC | PRN

## 2024-02-08 NOTE — Assessment & Plan Note (Signed)
 Mary Stark had a successful geniculate artery embolization with Dr. Elby Showers for left knee. She is now having recurrence of pain, I would like her to see him again for discussion of repeat GAE.

## 2024-02-08 NOTE — Progress Notes (Signed)
    Procedures performed today:    Procedure: Real-time Ultrasound Guided injection of the left scaphotrapeziotrapezoidal joint Device: Samsung HS60  Verbal informed consent obtained.  Time-out conducted.  Noted no overlying erythema, induration, or other signs of local infection.  Skin prepped in a sterile fashion.  Local anesthesia: Topical Ethyl chloride.  With sterile technique and under real time ultrasound guidance: Noted arthritic joint, 1 cc lidocaine, 1 cc kenalog 40 injected easily. Completed without difficulty  Advised to call if fevers/chills, erythema, induration, drainage, or persistent bleeding.  Images permanently stored and available for review in PACS.  Impression: Technically successful ultrasound guided injection.  Independent interpretation of notes and tests performed by another provider:   None.  Brief History, Exam, Impression, and Recommendations:    Primary osteoarthritis, left wrist This is a very pleasant 68 year old female, she has a long history of left wrist pain. She has a lot of pain on the radial aspect, volar left wrist. On exam she has minimal swelling, good motion and good strength. She has only minimal tenderness at the [facility] joint, pain is more dramatic somewhat proximal at the scaphotrapeziotrapezoidal joint. I will refill her hydrocodone. We injected the STT joint today. Adding x-rays. Return to see me in 6 weeks.  Primary osteoarthritis of both knees Ellisha had a successful geniculate artery embolization with Dr. Elby Showers for left knee. She is now having recurrence of pain, I would like her to see him again for discussion of repeat GAE.    ____________________________________________ Ihor Austin. Benjamin Stain, M.D., ABFM., CAQSM., AME. Primary Care and Sports Medicine [facility]  Adjunct Professor of Family Medicine  [employer]  Restaurant manager, fast food

## 2024-02-08 NOTE — Assessment & Plan Note (Signed)
 This is a very pleasant 68 year old female, she has a long history of left wrist pain. She has a lot of pain on the radial aspect, volar left wrist. On exam she has minimal swelling, good motion and good strength. She has only minimal tenderness at the [facility] joint, pain is more dramatic somewhat proximal at the scaphotrapeziotrapezoidal joint. I will refill her hydrocodone. We injected the STT joint today. Adding x-rays. Return to see me in 6 weeks.

## 2024-02-10 ENCOUNTER — Encounter: Payer: Self-pay | Admitting: Medical-Surgical

## 2024-02-10 ENCOUNTER — Ambulatory Visit (INDEPENDENT_AMBULATORY_CARE_PROVIDER_SITE_OTHER): Payer: Medicare HMO | Admitting: Medical-Surgical

## 2024-02-10 VITALS — BP 153/79 | HR 62 | Resp 20 | Ht 66.0 in | Wt 124.3 lb

## 2024-02-10 DIAGNOSIS — F418 Other specified anxiety disorders: Secondary | ICD-10-CM

## 2024-02-10 NOTE — Progress Notes (Signed)
        Established patient visit  History, exam, impression, and plan:  1. Depression with anxiety (Primary) Pleasant 68 year old female presenting today for follow up on mood. Approximately 6 weeks ago, she was experiencing some significant mental health struggles as she had lost her husband and was also dealing with insurance frustrations regarding her healthcare. We increased her Zoloft to 50mg  daily and continued her Wellbutrin and Klonopin as prescribed. Today, she reports tolerating the increased dose of Zoloft well without side effects. Feels that the medication is working better and she is now able to get through the day without crying. She is able to look at her husband's passing and note the positives in how he went. Still using Wellbutrin and Klonopin as prescribed. Denies SI/HI. No desire to change medication doses today. Continue Zoloft, Wellbutrin, and Klonopin as prescribed.   Procedures performed this visit: None.  Return in about 3 months (around 05/09/2024) for chronic disease follow up.  __________________________________ Thayer Ohm, DNP, APRN, FNP-BC Primary Care and Sports Medicine [facility]

## 2024-02-12 ENCOUNTER — Encounter: Payer: Self-pay | Admitting: Medical-Surgical

## 2024-03-01 ENCOUNTER — Other Ambulatory Visit: Payer: Self-pay | Admitting: Medical-Surgical

## 2024-03-01 DIAGNOSIS — C679 Malignant neoplasm of bladder, unspecified: Secondary | ICD-10-CM | POA: Diagnosis not present

## 2024-03-01 DIAGNOSIS — Z5111 Encounter for antineoplastic chemotherapy: Secondary | ICD-10-CM | POA: Diagnosis not present

## 2024-03-01 DIAGNOSIS — Z5112 Encounter for antineoplastic immunotherapy: Secondary | ICD-10-CM | POA: Diagnosis not present

## 2024-03-01 DIAGNOSIS — C772 Secondary and unspecified malignant neoplasm of intra-abdominal lymph nodes: Secondary | ICD-10-CM | POA: Diagnosis not present

## 2024-03-01 DIAGNOSIS — Z79899 Other long term (current) drug therapy: Secondary | ICD-10-CM | POA: Diagnosis not present

## 2024-03-01 DIAGNOSIS — C676 Malignant neoplasm of ureteric orifice: Secondary | ICD-10-CM | POA: Diagnosis not present

## 2024-03-01 MED ORDER — SIMVASTATIN 20 MG PO TABS: 20.0000 mg | ORAL_TABLET | Freq: Every day | ORAL | 3 refills | Status: DC

## 2024-03-01 MED ORDER — SERTRALINE HCL 50 MG PO TABS: 50.0000 mg | ORAL_TABLET | Freq: Every day | ORAL | 0 refills | Status: DC

## 2024-03-01 NOTE — Telephone Encounter (Signed)
 Requesting rx rf of zoloft and zocor Zoloft 50mg  - last written 12/28/2023 Zocor 20mg  -last written 04/01/2023 Last lipid panel 04/14/2023 outside data Last OV 02/10/2024 Upcoming appt = 03/21/24  Dr. Benjamin Stain 05/29/225 joy jessup, NP

## 2024-03-03 NOTE — Progress Notes (Signed)
 Referring Physician(s): Monica Becton  Chief Complaint: The patient is seen in virtual video follow up today s/p left geniculate artery embolization 07/08/23.   History of present illness: HPI from last clinic visit 12/31/23 Mary Stark is a 68 y.o. female with a medical history significant for advanced stage bladder cancer currently on Keytruda, HTN, alcohol/tobacco use, hepatitis C, CKD, anxiety/depression, hydronephrosis s/p bilateral stents (2021) and bladder cancer. She is familiar to IR from a lymph node biopsy and port-a-catheter placement 2021. She also has a history of bilateral knee osteoarthritis and has had steroid injections and the Orthovisc series which she finished a few months ago. The right knee did well but the left knee continues to hurt. The patient is not interested in arthroplasty. She was referred for genicular RFA but was denied by insurance.    She has now been referred to IR to discuss genicular artery embolization.    Her left knee pain waxes and wanes, and is most severe when walking and turning. The pain occasionally wakes her up at night when it is severe.  Given her advanced stage bladder cancer diagnosis, she is against having surgery to deal with her knee pain and wants to pursue a minimally invasive option.   She underwent left geniculate artery embolization on 07/08/23. She's states that the first month after the procedure she had significantly improved symptoms, however has developed some recurrent pain after climbing the bleachers at her family member's soccer game recently.  She denies skin changes. She states that the naproxen after the procedure affected her renal function and potassium excretion.     She had recovered well with improved WOMAC pain score from 46/96 to 21/96. She stated that the pain had crept back some, possibly due to a new injury climbing bleachers. She planned to reach out to see Dr. Benjamin Stain again and we made plans to  follow up in 3 months.     Today she reports her WOMAC score as 12/96. Her VAS pain score is 2/10.  Still taking ultram occasionally. She is very happy with her results. She can still feel some popping in her knee, but it's essentially pain free. She is able to go up and down stairs normally now. She has some new mild pain in the anterior knee.  We had discussed following up in 6 months but she was evaluated by Dr. Benjamin Stain 02/08/24 and reported recurrence of her left knee pain. She presents today via virtual video visit to discuss the possibility of a repeat geniculate artery embolization.  She states that her knee pain has returned intermittently.  She has days that pass without any pain, then some days the pain is severe.  She has not injured her knee.  She is back on taking chemotherapy for her bladder cancer.    Past Medical History:  Diagnosis Date   Adenocarcinoma of bladder, stage 4 (HCC) 10/2020   oncologist--- dr Clelia Croft and urologist-- dr pace;  w/ mets , started chemo 12/ 2021 and bilateral ureteral stents to treat hydronephrosis   Alcohol dependence (HCC)    drank heavily years ago doesn't now 10/22/2021   Anemia due to chemotherapy    Bilateral hydronephrosis    urologsit--- dr pace;   chronic issue,  treated with ureteral stents   CKD (chronic kidney disease), stage III (HCC)    Diverticulosis of colon    Encounter for antineoplastic chemotherapy    Family history of adverse reaction to anesthesia    father had  a "tight" airway   GAD (generalized anxiety disorder)    GERD (gastroesophageal reflux disease)    History of 2019 novel coronavirus disease (COVID-19) 08/13/2020   positive result in epic 08-19-2020,  hospital admission in epic,  covid pneumonia with hypoxia respiratory failure, discharged with oxygen for 6 weeks   History of adenomatous polyp of colon    History of chemotherapy last done 05-13-2021   History of colitis 03/2018   acute colitis with sepsis   History  of hepatitis C    completed treatment in 2012   History of kidney stones    History of rheumatic fever as a child    per pt no valvular issues   History of sepsis    11/ 2021hospital admission in epic due to pyelonephritis due to hydronephrosis, resolved//   urosepsis 2022   History of skin cancer    excision from nose   History of small bowel obstruction 05/25/2022   s/p  right hemicolectomy   Hyperlipidemia    Hypertension    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HCC)    MDD (major depressive disorder)    Metastasis from bladder cancer (HCC)    Neoplastic (malignant) related fatigue    OA (osteoarthritis)    Back and lt knee   Palliative care patient    Port-A-Cath in place 11/18/2020   Seasonal allergies    Vitamin D deficiency    Wears glasses     Past Surgical History:  Procedure Laterality Date   ANAL RECTAL MANOMETRY N/A 01/31/2020   Procedure: ANO RECTAL MANOMETRY;  Surgeon: Willis Modena, MD;  Location: [facility];  Service: Endoscopy;  Laterality: N/A;   CESAREAN SECTION  1984   COLONOSCOPY WITH PROPOFOL  05/07/2022   [facility]--- dr outlaw   CYSTOSCOPY W/ URETERAL STENT PLACEMENT Bilateral 01/21/2021   Procedure: CYSTOSCOPY WITH STENT REPLACEMENT;  Surgeon: Noel Christmas, MD;  Location: [facility];  Service: Urology;  Laterality: Bilateral;  1 HR   CYSTOSCOPY W/ URETERAL STENT PLACEMENT Bilateral 05/20/2021   Procedure: CYSTOSCOPY WITH RETROGRADE PYELOGRAM/URETERAL STENT EXCHANGE;  Surgeon: Noel Christmas, MD;  Location: [facility];  Service: Urology;  Laterality: Bilateral;   CYSTOSCOPY W/ URETERAL STENT PLACEMENT Bilateral 10/28/2021   Procedure: CYSTOSCOPY WITH RETROGRADE PYELOGRAM/URETERAL STENT EXCHANGE;  Surgeon: Noel Christmas, MD;  Location: [facility];  Service: Urology;  Laterality: Bilateral;   CYSTOSCOPY W/ URETERAL STENT PLACEMENT Bilateral 04/28/2022   Procedure: CYSTOSCOPY Otilio Miu STENT EXCHANGE;   Surgeon: Noel Christmas, MD;  Location: [facility];  Service: Urology;  Laterality: Bilateral;  1 HR   CYSTOSCOPY W/ URETERAL STENT PLACEMENT Bilateral 11/17/2022   Procedure: CYSTOSCOPY WITH RETROGRADE PYELOGRAM/URETERAL STENT REPLACEMENT;  Surgeon: Noel Christmas, MD;  Location: [facility];  Service: Urology;  Laterality: Bilateral;   CYSTOSCOPY WITH RETROGRADE PYELOGRAM, URETEROSCOPY AND STENT PLACEMENT Bilateral 06/11/2020   Procedure: CYSTOSCOPY WITH RETROGRADE PYELOGRAM, URETEROSCOPY AND STENT PLACEMENT, RIGHT URETERAL BIOPSY;  Surgeon: Noel Christmas, MD;  Location: [facility];  Service: Urology;  Laterality: Bilateral;  90 MINS   CYSTOSCOPY WITH RETROGRADE PYELOGRAM, URETEROSCOPY AND STENT PLACEMENT Bilateral 07/05/2020   Procedure: CYSTOSCOPY WITH RETROGRADE PYELOGRAM, URETEROSCOPY,  BIOPSIES AND STENT EXCHANGES;  Surgeon: Noel Christmas, MD;  Location: [facility];  Service: Urology;  Laterality: Bilateral;   FINGER SURGERY Right    5th   GYNECOLOGIC CRYOSURGERY  YRS AGO   IR 3D INDEPENDENT WKST  07/08/2023   IR  ANGIOGRAM EXTREMITY LEFT  07/08/2023   IR ANGIOGRAM SELECTIVE EACH ADDITIONAL VESSEL  07/08/2023   IR ANGIOGRAM SELECTIVE EACH ADDITIONAL VESSEL  07/08/2023   IR EMBO ARTERIAL NOT HEMORR HEMANG INC GUIDE ROADMAPPING  07/08/2023   IR IMAGING GUIDED PORT INSERTION  11/18/2020   IR RADIOLOGIST EVAL & MGMT  06/09/2023   IR RADIOLOGIST EVAL & MGMT  08/20/2023   IR RADIOLOGIST EVAL & MGMT  12/31/2023   IR US GUIDE VASC ACCESS LEFT  07/08/2023   PARTIAL COLECTOMY Right 05/25/2022   Procedure: EXPLORATORY LAPAROTOMY; RIGHT HEMI COLECTOMY; PARTIAL OMENTECTOMY;  Surgeon: Manus Rudd, MD;  Location: [facility];  Service: General;  Laterality: Right;   TONSILLECTOMY     chld   TRANSURETHRAL RESECTION OF BLADDER TUMOR  01/21/2021   Procedure: TRANSURETHRAL RESECTION OF BLADDER TUMOR (TURBT);  Surgeon: Noel Christmas, MD;  Location: [facility];  Service: Urology;;    Allergies: Penicillins and Metformin  Medications: Prior to Admission medications   Medication Sig Start Date End Date Taking? Authorizing Provider  albuterol (PROAIR HFA) 108 (90 Base) MCG/ACT inhaler INHALE 1 TO 2 PUFFS BY MOUTH INTO THE LUNGS EVERY 4 HOURS AS NEEDED FOR WHEEZING OR SHORTNESS OF BREATH 02/16/23   Christen Butter, NP  ARIPiprazole (ABILIFY) 10 MG tablet TAKE 1 TABLET AT BEDTIME 02/19/23   Christen Butter, NP  buPROPion (WELLBUTRIN XL) 300 MG 24 hr tablet TAKE 1 TABLET EVERY DAY 08/30/23   Christen Butter, NP  clonazePAM Scarlette Calico) 1 MG tablet May take 0.5 tablets (0.5 mg total) by mouth daily as needed. May also take 1 tablet (1 mg total) at bedtime as needed. 12/28/23   Christen Butter, NP  fexofenadine (FT ALLERGY RELIEF 24 HOUR) 180 MG tablet Take 1 tablet (180 mg total) by mouth daily. 07/14/23   Christen Butter, NP  gabapentin (NEURONTIN) 300 MG capsule TAKE 1 CAPSULE DAILY AND 4 CAPSULES AT BEDTIME AS DIRECTED. 12/28/23   Christen Butter, NP  HYDROcodone-acetaminophen (NORCO) 10-325 MG tablet Take 1 tablet by mouth 2 (two) times daily as needed. 02/08/24   Monica Becton, MD  ipratropium (ATROVENT) 0.03 % nasal spray Place 2 sprays into both nostrils every 12 (twelve) hours. 12/28/23   Christen Butter, NP  lidocaine-prilocaine (EMLA) cream Apply 1 application topically as needed. Patient taking differently: Apply 1 application  topically as needed (for port access). 10/16/21   Benjiman Core, MD  Meth-Hyo-M Bl-Na Phos-Ph Sal (URIBEL) 118 MG CAPS Take one capsule by mouth 3 times daily Patient taking differently: as needed (spasms). 06/06/21     nebivolol (BYSTOLIC) 10 MG tablet TAKE 1 TABLET EVERY DAY Patient taking differently: Take 5 mg by mouth daily. 04/16/23   Christen Butter, NP  ondansetron (ZOFRAN) 4 MG tablet Take 1 tablet (4 mg total) by mouth every 8 (eight) hours as needed for nausea or vomiting. 04/30/21   Benjiman Core, MD  phenazopyridine  (PYRIDIUM) 200 MG tablet Take 200 mg by mouth 3 (three) times daily. 07/01/23   [provider]  phentermine 37.5 MG capsule Take 37.5 mg by mouth every morning.    [provider]  sertraline (ZOLOFT) 50 MG tablet Take 1 tablet (50 mg total) by mouth daily. 03/01/24   Christen Butter, NP  simvastatin (ZOCOR) 20 MG tablet Take 1 tablet (20 mg total) by mouth daily. Needs lab work. 03/01/24   Christen Butter, NP  tamsulosin (FLOMAX) 0.4 MG CAPS capsule Take 1 capsule (0.4 mg total) by mouth at  bedtime. 09/22/22     traMADol (ULTRAM) 50 MG tablet Take 1-2 tablets (50-100 mg total) by mouth 3 (three) times daily as needed. 07/30/23   Monica Becton, MD     Family History  Problem Relation Age of Onset   Ovarian cancer Mother    Cardiomyopathy Father    Valvular heart disease Father    Liver cancer Brother    Protein C deficiency Brother    Protein C deficiency Brother    Stroke Brother     Social History   Socioeconomic History   Marital status: Married    Spouse name: Not on file   Number of children: Not on file   Years of education: Not on file   Highest education level: Bachelor's degree (e.g., BA, AB, BS)  Occupational History   Not on file  Tobacco Use   Smoking status: Former    Current packs/day: 0.00    Average packs/day: 1 pack/day for 25.0 years (25.0 ttl pk-yrs)    Types: Cigarettes    Start date: 74    Quit date: 2012    Years since quitting: 13.2   Smokeless tobacco: Never  Vaping Use   Vaping status: Every Day   Substances: Nicotine   Devices: unsure name  Substance and Sexual Activity   Alcohol use: Yes    Comment: occasional beer   Drug use: Never   Sexual activity: Not on file  Other Topics Concern   Not on file  Social History Narrative   Not on file   Social Drivers of Health   Financial Resource Strain: Low Risk  (02/07/2024)   Overall Financial Resource Strain (CARDIA)    Difficulty of Paying Living Expenses: Not hard at all   Food Insecurity: No Food Insecurity (02/07/2024)   Hunger Vital Sign    Worried About Running Out of Food in the Last Year: Never true    Ran Out of Food in the Last Year: Never true  Transportation Needs: No Transportation Needs (02/07/2024)   PRAPARE - Administrator, Civil Service (Medical): No    Lack of Transportation (Non-Medical): No  Physical Activity: Unknown (02/07/2024)   Exercise Vital Sign    Days of Exercise per Week: 0 days    Minutes of Exercise per Session: Not on file  Stress: Stress Concern Present (02/07/2024)   [employer] of Occupational Health - Occupational Stress Questionnaire    Feeling of Stress : To some extent  Social Connections: Moderately Isolated (02/07/2024)   Social Connection and Isolation Panel [NHANES]    Frequency of Communication with Friends and Family: Three times a week    Frequency of Social Gatherings with Friends and Family: Three times a week    Attends Religious Services: Never    Active Member of Clubs or Organizations: Yes    Attends Banker Meetings: More than 4 times per year    Marital Status: Widowed     Vital Signs: There were no vitals taken for this visit.  No physical exam was performed in lieu of virtual video visit.    Imaging: Left knee 11/27/20  Kellgren and Lyman Bishop grade 3 (Moderate)   Left GAE (07/08/23)     0.5 mL dilute 100-300 micron embospheres administered into articular branch of left descending geniculate artery  Labs:  CBC: Recent Labs    07/08/23 0809  WBC 5.6  HGB 10.1*  HCT 31.9*  PLT 184    COAGS: Recent Labs  07/08/23 0809  INR 1.1    BMP: Recent Labs    07/08/23 0809  NA 130*  K 4.0  CL 98  CO2 22  GLUCOSE 80  BUN 14  CALCIUM 8.9  CREATININE 1.10*  GFRNONAA 56*    LIVER FUNCTION TESTS: No results for input(s): "BILITOT", "AST", "ALT", "ALKPHOS", "PROT", "ALBUMIN" in the last 8760 hours.  Assessment and Plan: 68 year old female with  history of severe left knee pain secondary to osteoarthritis (K&G grade 3) with lifestyle limiting pain and discomfort refractory to conservative measures including sustained release steroid injections and hyaluronic acid injections. She has advanced stage bladder cancer and wished to pursue minimally invasive options to alleviate her knee pain, therefore underwent technically successful left GAE on 07/08/23.   She now has recurrent, but very intermittent left knee pain.  She pain is not frequent enough currently to warrant intervention.  We discussed that if her pain returns to the point that its affecting her lifestyle, we will then consider repeat left geniculate artery embolization.  She will notify our office when she would like to schedule.  Marliss Coots, MD Pager: [phone]    I spent a total of 25 Minutes in virtual video clinical consultation, greater than 50% of which was counseling/coordinating care for left knee pain.

## 2024-03-06 ENCOUNTER — Ambulatory Visit
Admission: RE | Admit: 2024-03-06 | Discharge: 2024-03-06 | Disposition: A | Discharge status: Home / Self Care | Source: Ambulatory Visit | Attending: Sports Medicine | Admitting: Sports Medicine

## 2024-03-06 DIAGNOSIS — M170 Bilateral primary osteoarthritis of knee: Secondary | ICD-10-CM

## 2024-03-06 DIAGNOSIS — M1712 Unilateral primary osteoarthritis, left knee: Secondary | ICD-10-CM | POA: Diagnosis not present

## 2024-03-06 DIAGNOSIS — M25552 Pain in left hip: Secondary | ICD-10-CM | POA: Diagnosis not present

## 2024-03-06 HISTORY — PX: IR RADIOLOGIST EVAL & MGMT: IMG5224

## 2024-03-08 DIAGNOSIS — Z5111 Encounter for antineoplastic chemotherapy: Secondary | ICD-10-CM | POA: Diagnosis not present

## 2024-03-08 DIAGNOSIS — Z79899 Other long term (current) drug therapy: Secondary | ICD-10-CM | POA: Diagnosis not present

## 2024-03-08 DIAGNOSIS — Z5112 Encounter for antineoplastic immunotherapy: Secondary | ICD-10-CM | POA: Diagnosis not present

## 2024-03-08 DIAGNOSIS — C676 Malignant neoplasm of ureteric orifice: Secondary | ICD-10-CM | POA: Diagnosis not present

## 2024-03-08 DIAGNOSIS — C772 Secondary and unspecified malignant neoplasm of intra-abdominal lymph nodes: Secondary | ICD-10-CM | POA: Diagnosis not present

## 2024-03-08 DIAGNOSIS — C679 Malignant neoplasm of bladder, unspecified: Secondary | ICD-10-CM | POA: Diagnosis not present

## 2024-03-10 ENCOUNTER — Ambulatory Visit: Payer: Self-pay

## 2024-03-10 NOTE — Telephone Encounter (Signed)
  Chief Complaint: daily lip bleeding Symptoms: spot on lip that is painful during the day, bleeds at night Frequency: about 2 months Pertinent Negatives: Patient denies fever, blood thinners, difficulty swallowing, difficulty breathing  Disposition: [] ED /[] Urgent Care (no appt availability in office) / [x] Appointment(In office/virtual)/ []  [place] [facility]/ [] Home Care/ [] Refused Recommended Disposition /[] [place] [facility]/ []  Follow-up with PCP  Additional Notes: Pt states that her lip has been bleeding for over 2 months, was not bleeding every night until now. Pt states that she uses lip balm before bed. States she will try using Vaseline before bed and see if that makes a difference. Pt hx of skin cancer and bladder cancer. States this does not look like prior skin cancer, has reached out to derm and no one can get her in until at least fall. Pt states that she does receive chemo but this started before the chemo for her bladder cancer. Pt advised of care instructions per Epic, sched. Will be seeing dentist next business day and will have them evaluate the spot as well.  Copied from CRM [id number]. Topic: Clinical - Red Word Triage >> Mar 10, 2024 12:03 PM Fuller Mandril wrote: Red Word that prompted transfer to Nurse Triage: Bleeding on face/lip - previous skin cancer. Now is very painful Reason for Disposition  Weak immune system (e.g., HIV positive, cancer chemo, splenectomy, organ transplant, chronic steroids)  Answer Assessment - Initial Assessment Questions 1. SYMPTOM: "What's the main symptom you're concerned about?" (e.g., chapped lips, dry mouth, lump, sores)     Spot that is raw and raised lip, that bleeds at night 2. ONSET: "When did the wound on the lip start?"     2 months 3. PAIN: "Is there any pain?" If Yes, ask: "How bad is it?" (Scale: 1-10; mild, moderate, severe)   - MILD (1-3):  doesn't interfere with eating or normal activities   - MODERATE (4-7): interferes with  eating some solids and normal activities   - SEVERE (8-10):  excruciating pain, interferes with most normal activities   - SEVERE DYSPHAGIA: can't swallow liquids, drooling     3 4. CAUSE: "What do you think is causing the symptoms?"     Hx of cancer, including skin cancer 5. OTHER SYMPTOMS: "Do you have any other symptoms?" (e.g., fever, sore throat, toothache, swelling)     denies  Protocols used: Mouth Symptoms-A-AH

## 2024-03-14 ENCOUNTER — Ambulatory Visit: Admitting: Physician Assistant

## 2024-03-14 DIAGNOSIS — K1239 Other oral mucositis (ulcerative): Secondary | ICD-10-CM | POA: Diagnosis not present

## 2024-03-14 DIAGNOSIS — R69 Illness, unspecified: Secondary | ICD-10-CM | POA: Diagnosis not present

## 2024-03-14 DIAGNOSIS — H524 Presbyopia: Secondary | ICD-10-CM | POA: Diagnosis not present

## 2024-03-14 DIAGNOSIS — K1329 Other disturbances of oral epithelium, including tongue: Secondary | ICD-10-CM | POA: Diagnosis not present

## 2024-03-21 ENCOUNTER — Ambulatory Visit

## 2024-03-21 ENCOUNTER — Ambulatory Visit (INDEPENDENT_AMBULATORY_CARE_PROVIDER_SITE_OTHER): Payer: Medicare HMO | Admitting: Sports Medicine

## 2024-03-21 ENCOUNTER — Encounter: Payer: Self-pay | Admitting: Sports Medicine

## 2024-03-21 DIAGNOSIS — Z23 Encounter for immunization: Secondary | ICD-10-CM | POA: Diagnosis not present

## 2024-03-21 DIAGNOSIS — G894 Chronic pain syndrome: Secondary | ICD-10-CM

## 2024-03-21 DIAGNOSIS — S92535A Nondisplaced fracture of distal phalanx of left lesser toe(s), initial encounter for closed fracture: Secondary | ICD-10-CM | POA: Diagnosis not present

## 2024-03-21 DIAGNOSIS — M19032 Primary osteoarthritis, left wrist: Secondary | ICD-10-CM | POA: Diagnosis not present

## 2024-03-21 DIAGNOSIS — M79675 Pain in left toe(s): Secondary | ICD-10-CM

## 2024-03-21 MED ORDER — TRAMADOL HCL 50 MG PO TABS
50.0000 mg | ORAL_TABLET | Freq: Three times a day (TID) | ORAL | 3 refills | Status: DC | PRN
Start: 2024-03-21 — End: 2024-06-12

## 2024-03-21 MED ORDER — DOXYCYCLINE HYCLATE 100 MG PO TABS: 100.0000 mg | ORAL_TABLET | Freq: Two times a day (BID) | ORAL | 0 refills | Status: AC

## 2024-03-21 NOTE — Assessment & Plan Note (Addendum)
 Recent inversion injury while walking outside in the dark barefoot. Now having increasing discomfort left fourth toe. She also had an abrasion on the sole of the right foot. Right foot looks okay, of advised her to keep it covered and to avoid walking barefoot. As for the left foot she does have some swelling, erythema and tenderness as well as goldish crust about the fourth toe. Adding x-rays, doxycycline, she will buddy tape the third and fourth toes together. She is up-to-date on Tdap, it was done in November 2016, she will be due in November 2026, however because this was a dirty wound and her Tdap was over 5 years ago she does need a booster.  Update: There is a nondisplaced fracture of the fourth distal phalanx, this is just a crack, buddy taping will be sufficient.

## 2024-03-21 NOTE — Addendum Note (Signed)
 Addended by: Monica Becton on: 03/21/2024 01:54 PM   Modules accepted: Orders

## 2024-03-21 NOTE — Progress Notes (Addendum)
    Procedures performed today:    None.  Independent interpretation of notes and tests performed by another provider:   None.  Brief History, Exam, Impression, and Recommendations:    Toe pain, left fourth Recent inversion injury while walking outside in the dark barefoot. Now having increasing discomfort left fourth toe. She also had an abrasion on the sole of the right foot. Right foot looks okay, of advised her to keep it covered and to avoid walking barefoot. As for the left foot she does have some swelling, erythema and tenderness as well as goldish crust about the fourth toe. Adding x-rays, doxycycline, she will buddy tape the third and fourth toes together. She is up-to-date on Tdap, it was done in November 2016, she will be due in November 2026, however because this was a dirty wound and her Tdap was over 5 years ago she does need a booster.  Update: There is a nondisplaced fracture of the fourth distal phalanx, this is just a crack, buddy taping will be sufficient.  Primary osteoarthritis, left wrist Wrist is doing really well after scaphotrapeziotrapezoidal joint injection back in February, return to see me as needed for this.  Chronic pain syndrome Hydrocodone is a bit strong, downgrading to tramadol 3 times daily.    ____________________________________________ Joselyn Nicely. Sandy Crumb, M.D., ABFM., CAQSM., AME. Primary Care and Sports Medicine [facility]  Adjunct Professor of [facility] Medicine  [employer]  Restaurant manager, fast food

## 2024-03-21 NOTE — Assessment & Plan Note (Signed)
 Wrist is doing really well after scaphotrapeziotrapezoidal joint injection back in February, return to see me as needed for this.

## 2024-03-21 NOTE — Assessment & Plan Note (Signed)
 Hydrocodone is a bit strong, downgrading to tramadol 3 times daily.

## 2024-03-21 NOTE — Addendum Note (Signed)
 Addended by: Samule Dry on: 03/21/2024 02:45 PM   Modules accepted: Orders

## 2024-03-22 DIAGNOSIS — C679 Malignant neoplasm of bladder, unspecified: Secondary | ICD-10-CM | POA: Diagnosis not present

## 2024-03-22 DIAGNOSIS — Z5112 Encounter for antineoplastic immunotherapy: Secondary | ICD-10-CM | POA: Diagnosis not present

## 2024-03-22 DIAGNOSIS — Z79899 Other long term (current) drug therapy: Secondary | ICD-10-CM | POA: Diagnosis not present

## 2024-03-22 DIAGNOSIS — C670 Malignant neoplasm of trigone of bladder: Secondary | ICD-10-CM | POA: Diagnosis not present

## 2024-03-24 ENCOUNTER — Ambulatory Visit: Admitting: Medical-Surgical

## 2024-03-29 DIAGNOSIS — C679 Malignant neoplasm of bladder, unspecified: Secondary | ICD-10-CM | POA: Diagnosis not present

## 2024-03-29 DIAGNOSIS — C670 Malignant neoplasm of trigone of bladder: Secondary | ICD-10-CM | POA: Diagnosis not present

## 2024-03-29 DIAGNOSIS — Z5112 Encounter for antineoplastic immunotherapy: Secondary | ICD-10-CM | POA: Diagnosis not present

## 2024-03-29 DIAGNOSIS — Z79899 Other long term (current) drug therapy: Secondary | ICD-10-CM | POA: Diagnosis not present

## 2024-04-12 DIAGNOSIS — Z79899 Other long term (current) drug therapy: Secondary | ICD-10-CM | POA: Diagnosis not present

## 2024-04-12 DIAGNOSIS — C670 Malignant neoplasm of trigone of bladder: Secondary | ICD-10-CM | POA: Diagnosis not present

## 2024-04-12 DIAGNOSIS — C679 Malignant neoplasm of bladder, unspecified: Secondary | ICD-10-CM | POA: Diagnosis not present

## 2024-04-12 DIAGNOSIS — Z5112 Encounter for antineoplastic immunotherapy: Secondary | ICD-10-CM | POA: Diagnosis not present

## 2024-04-19 DIAGNOSIS — Z5112 Encounter for antineoplastic immunotherapy: Secondary | ICD-10-CM | POA: Diagnosis not present

## 2024-04-19 DIAGNOSIS — C678 Malignant neoplasm of overlapping sites of bladder: Secondary | ICD-10-CM | POA: Diagnosis not present

## 2024-04-19 DIAGNOSIS — E875 Hyperkalemia: Secondary | ICD-10-CM | POA: Diagnosis not present

## 2024-04-19 DIAGNOSIS — E871 Hypo-osmolality and hyponatremia: Secondary | ICD-10-CM | POA: Diagnosis not present

## 2024-04-19 DIAGNOSIS — C679 Malignant neoplasm of bladder, unspecified: Secondary | ICD-10-CM | POA: Diagnosis not present

## 2024-04-26 DIAGNOSIS — N1330 Unspecified hydronephrosis: Secondary | ICD-10-CM | POA: Diagnosis not present

## 2024-04-26 DIAGNOSIS — R590 Localized enlarged lymph nodes: Secondary | ICD-10-CM | POA: Diagnosis not present

## 2024-04-26 DIAGNOSIS — R918 Other nonspecific abnormal finding of lung field: Secondary | ICD-10-CM | POA: Diagnosis not present

## 2024-04-26 DIAGNOSIS — C679 Malignant neoplasm of bladder, unspecified: Secondary | ICD-10-CM | POA: Diagnosis not present

## 2024-04-27 ENCOUNTER — Other Ambulatory Visit: Payer: Self-pay

## 2024-04-27 ENCOUNTER — Other Ambulatory Visit: Payer: Self-pay | Admitting: Medical-Surgical

## 2024-04-27 MED ORDER — ARIPIPRAZOLE 10 MG PO TABS: 10.0000 mg | ORAL_TABLET | Freq: Every day | ORAL | 3 refills | Status: AC

## 2024-04-27 NOTE — Telephone Encounter (Unsigned)
 Copied from CRM [id number]. Topic: Clinical - Medication Refill >> Apr 27, 2024  1:50 PM Retta Caster wrote: Medication: ARIPiprazole  (ABILIFY ) 10 MG tablet   Has the patient contacted their pharmacy? Yes (Agent: If no, request that the patient contact the pharmacy for the refill. If patient does not wish to contact the pharmacy document the reason why and proceed with request.) (Agent: If yes, when and what did the pharmacy advise?)  This is the patient's preferred pharmacy:   [id number] - [place], [place] - [address] AT [facility] [address] [place] [address] Phone: [phone] Fax: [phone]    Is this the correct pharmacy for this prescription? Yes If no, delete pharmacy and type the correct one.   Has the prescription been filled recently? Yes  Is the patient out of the medication? No 1 week left  Has the patient been seen for an appointment in the last year OR does the patient have an upcoming appointment? Yes  Can we respond through MyChart? Yes  Agent: Please be advised that Rx refills may take up to 3 business days. We ask that you follow-up with your pharmacy.

## 2024-04-27 NOTE — Addendum Note (Signed)
 Encounter addended by: Adam Holm on: 04/27/2024 10:39 AM  Actions taken: Imaging Exam ended

## 2024-05-02 ENCOUNTER — Encounter: Payer: Self-pay | Admitting: Sports Medicine

## 2024-05-02 ENCOUNTER — Ambulatory Visit (INDEPENDENT_AMBULATORY_CARE_PROVIDER_SITE_OTHER)

## 2024-05-02 ENCOUNTER — Ambulatory Visit (INDEPENDENT_AMBULATORY_CARE_PROVIDER_SITE_OTHER): Admitting: Sports Medicine

## 2024-05-02 DIAGNOSIS — M170 Bilateral primary osteoarthritis of knee: Secondary | ICD-10-CM

## 2024-05-02 DIAGNOSIS — M25561 Pain in right knee: Secondary | ICD-10-CM

## 2024-05-02 DIAGNOSIS — M25562 Pain in left knee: Secondary | ICD-10-CM | POA: Diagnosis not present

## 2024-05-02 NOTE — Assessment & Plan Note (Addendum)
 Very pleasant 68 year old female, bilateral knee pain, she did have a successful geniculate artery embolization with Dr. Jinx Mourning back in 2024. Unfortunately was started to have a recurrence of pain, this time somewhat posteriorly and medially. She did get an episode of catching/locking. On exam she has pain at the medial joint line, she has reproduction of pain with terminal flexion, minimally positive McMurray sign I do suspect she has some degenerative meniscal tearing versus intra-articular loose body, pain currently is at a 2/10, so we will treat conservatively, she will get a equal hinged knee brace, home physical therapy, updated x-rays, she can return to see me in 6 weeks, injection if not better.

## 2024-05-02 NOTE — Progress Notes (Signed)
    Procedures performed today:    None.  Independent interpretation of notes and tests performed by another provider:   None.  Brief History, Exam, Impression, and Recommendations:    Primary osteoarthritis of both knees Very pleasant 68 year old female, bilateral knee pain, she did have a successful geniculate artery embolization with Dr. Jinx Mourning back in 2024. Unfortunately was started to have a recurrence of pain, this time somewhat posteriorly and medially. She did get an episode of catching/locking. On exam she has pain at the medial joint line, she has reproduction of pain with terminal flexion, minimally positive McMurray sign I do suspect she has some degenerative meniscal tearing versus intra-articular loose body, pain currently is at a 2/10, so we will treat conservatively, she will get a equal hinged knee brace, home physical therapy, updated x-rays, she can return to see me in 6 weeks, injection if not better.    ____________________________________________ Joselyn Nicely. Sandy Crumb, M.D., ABFM., CAQSM., AME. Primary Care and Sports Medicine [facility]  Adjunct Professor of [facility] Medicine  [employer]  Restaurant manager, fast food

## 2024-05-03 DIAGNOSIS — E871 Hypo-osmolality and hyponatremia: Secondary | ICD-10-CM | POA: Diagnosis not present

## 2024-05-03 DIAGNOSIS — Z5112 Encounter for antineoplastic immunotherapy: Secondary | ICD-10-CM | POA: Diagnosis not present

## 2024-05-03 DIAGNOSIS — C679 Malignant neoplasm of bladder, unspecified: Secondary | ICD-10-CM | POA: Diagnosis not present

## 2024-05-03 DIAGNOSIS — E875 Hyperkalemia: Secondary | ICD-10-CM | POA: Diagnosis not present

## 2024-05-03 DIAGNOSIS — C678 Malignant neoplasm of overlapping sites of bladder: Secondary | ICD-10-CM | POA: Diagnosis not present

## 2024-05-10 DIAGNOSIS — C678 Malignant neoplasm of overlapping sites of bladder: Secondary | ICD-10-CM | POA: Diagnosis not present

## 2024-05-10 DIAGNOSIS — C679 Malignant neoplasm of bladder, unspecified: Secondary | ICD-10-CM | POA: Diagnosis not present

## 2024-05-10 DIAGNOSIS — Z5112 Encounter for antineoplastic immunotherapy: Secondary | ICD-10-CM | POA: Diagnosis not present

## 2024-05-10 DIAGNOSIS — E871 Hypo-osmolality and hyponatremia: Secondary | ICD-10-CM | POA: Diagnosis not present

## 2024-05-10 DIAGNOSIS — E875 Hyperkalemia: Secondary | ICD-10-CM | POA: Diagnosis not present

## 2024-05-11 ENCOUNTER — Ambulatory Visit (INDEPENDENT_AMBULATORY_CARE_PROVIDER_SITE_OTHER): Payer: Medicare HMO | Admitting: Medical-Surgical

## 2024-05-11 ENCOUNTER — Encounter: Payer: Self-pay | Admitting: Medical-Surgical

## 2024-05-11 VITALS — BP 130/74 | HR 75 | Resp 20 | Ht 66.0 in | Wt 123.0 lb

## 2024-05-11 DIAGNOSIS — R4589 Other symptoms and signs involving emotional state: Secondary | ICD-10-CM

## 2024-05-11 DIAGNOSIS — F410 Panic disorder [episodic paroxysmal anxiety] without agoraphobia: Secondary | ICD-10-CM | POA: Diagnosis not present

## 2024-05-11 DIAGNOSIS — E785 Hyperlipidemia, unspecified: Secondary | ICD-10-CM

## 2024-05-11 DIAGNOSIS — Z0000 Encounter for general adult medical examination without abnormal findings: Secondary | ICD-10-CM

## 2024-05-11 DIAGNOSIS — G894 Chronic pain syndrome: Secondary | ICD-10-CM

## 2024-05-11 DIAGNOSIS — R062 Wheezing: Secondary | ICD-10-CM | POA: Diagnosis not present

## 2024-05-11 DIAGNOSIS — I10 Essential (primary) hypertension: Secondary | ICD-10-CM

## 2024-05-11 DIAGNOSIS — F332 Major depressive disorder, recurrent severe without psychotic features: Secondary | ICD-10-CM | POA: Diagnosis not present

## 2024-05-11 DIAGNOSIS — Z1231 Encounter for screening mammogram for malignant neoplasm of breast: Secondary | ICD-10-CM | POA: Diagnosis not present

## 2024-05-11 DIAGNOSIS — Z124 Encounter for screening for malignant neoplasm of cervix: Secondary | ICD-10-CM

## 2024-05-11 MED ORDER — CLONAZEPAM 1 MG PO TABS: ORAL_TABLET | ORAL | 1 refills | Status: DC

## 2024-05-11 MED ORDER — GABAPENTIN 300 MG PO CAPS: ORAL_CAPSULE | ORAL | 1 refills | Status: DC

## 2024-05-11 MED ORDER — SIMVASTATIN 20 MG PO TABS
20.0000 mg | ORAL_TABLET | Freq: Every day | ORAL | 3 refills | Status: AC
Start: 2024-05-11 — End: ?

## 2024-05-11 MED ORDER — BUPROPION HCL ER (XL) 300 MG PO TB24: 300.0000 mg | ORAL_TABLET | Freq: Every day | ORAL | 1 refills | Status: DC

## 2024-05-11 MED ORDER — ALBUTEROL SULFATE HFA 108 (90 BASE) MCG/ACT IN AERS: INHALATION_SPRAY | RESPIRATORY_TRACT | 1 refills | Status: AC

## 2024-05-11 MED ORDER — SERTRALINE HCL 50 MG PO TABS: 50.0000 mg | ORAL_TABLET | Freq: Every day | ORAL | 0 refills | Status: DC

## 2024-05-11 MED ORDER — NEBIVOLOL HCL 10 MG PO TABS: 10.0000 mg | ORAL_TABLET | Freq: Every day | ORAL | 1 refills | Status: DC

## 2024-05-11 NOTE — Progress Notes (Signed)
 Complete physical exam  Patient: Mary Stark   DOB: 01-11-1956   68 y.o. Female  MRN: [id number]  Subjective:     Chief Complaint  Patient presents with   Depression   Anxiety    Mary Stark is a 68 y.o. female who presents today for a complete physical exam. She reports consuming a general diet. The patient does not participate in regular exercise at present. She generally feels well. She reports sleeping fairly well. She does not have additional problems to discuss today.    Most recent fall risk assessment:    05/11/2024    3:47 PM  Fall Risk   Falls in the past year? 0  Number falls in past yr: 0  Injury with Fall? 0  Risk for fall due to : No Fall Risks  Follow up Falls evaluation completed     Most recent depression screenings:    05/11/2024    3:48 PM 12/28/2023    3:13 PM  PHQ 2/9 Scores  PHQ - 2 Score 1   PHQ- 9 Score 5   Exception Documentation  Other- indicate reason in comment box  Not completed  Husband passed 11/24/2023 doesn't feel like she should fill this out at this time    Vision:Within last year and Dental: No current dental problems and Receives regular dental care    Patient Care Team: Cherre Cornish, NP as PCP - General (Nurse Practitioner) Alba Ally, MD as Consulting Physician (Psychiatry) Syliva Even, MD as Consulting Physician (Sports Medicine) Renna Cary, MD (Inactive) as Consulting Physician (Oncology) Evangeline Hilts, MD as Consulting Physician (Gastroenterology)   Outpatient Medications Prior to Visit  Medication Sig   ARIPiprazole  (ABILIFY ) 10 MG tablet Take 1 tablet (10 mg total) by mouth at bedtime.   fexofenadine  (FT ALLERGY RELIEF 24 HOUR) 180 MG tablet Take 1 tablet (180 mg total) by mouth daily.   ipratropium (ATROVENT ) 0.03 % nasal spray Place 2 sprays into both nostrils every 12 (twelve) hours.   lidocaine -prilocaine  (EMLA ) cream Apply 1 application topically as needed.   ondansetron  (ZOFRAN ) 4 MG tablet  Take 1 tablet (4 mg total) by mouth every 8 (eight) hours as needed for nausea or vomiting.   phenazopyridine  (PYRIDIUM ) 200 MG tablet Take 200 mg by mouth 3 (three) times daily.   phentermine 37.5 MG capsule Take 37.5 mg by mouth every morning.   tamsulosin  (FLOMAX ) 0.4 MG CAPS capsule Take 1 capsule (0.4 mg total) by mouth at bedtime.   traMADol  (ULTRAM ) 50 MG tablet Take 1 tablet (50 mg total) by mouth 3 (three) times daily as needed.   [DISCONTINUED] albuterol  (PROAIR  HFA) 108 (90 Base) MCG/ACT inhaler INHALE 1 TO 2 PUFFS BY MOUTH INTO THE LUNGS EVERY 4 HOURS AS NEEDED FOR WHEEZING OR SHORTNESS OF BREATH   [DISCONTINUED] buPROPion  (WELLBUTRIN  XL) 300 MG 24 hr tablet TAKE 1 TABLET EVERY DAY   [DISCONTINUED] clonazePAM  (KLONOPIN ) 1 MG tablet May take 0.5 tablets (0.5 mg total) by mouth daily as needed. May also take 1 tablet (1 mg total) at bedtime as needed.   [DISCONTINUED] gabapentin  (NEURONTIN ) 300 MG capsule TAKE 1 CAPSULE DAILY AND 4 CAPSULES AT BEDTIME AS DIRECTED.   [DISCONTINUED] nebivolol  (BYSTOLIC ) 10 MG tablet TAKE 1 TABLET EVERY DAY   [DISCONTINUED] sertraline  (ZOLOFT ) 50 MG tablet Take 1 tablet (50 mg total) by mouth daily.   [DISCONTINUED] simvastatin  (ZOCOR ) 20 MG tablet Take 1 tablet (20 mg total) by mouth daily. Needs lab work.  Meth-Hyo-M Bl-Na Phos-Ph Sal (URIBEL ) 118 MG CAPS Take one capsule by mouth 3 times daily   No facility-administered medications prior to visit.   Review of Systems  Constitutional:  Negative for chills, fever, malaise/fatigue and weight loss.  HENT:  Negative for congestion, ear pain, hearing loss, sinus pain and sore throat.   Eyes:  Negative for blurred vision, photophobia and pain.  Respiratory:  Negative for cough, shortness of breath and wheezing.   Cardiovascular:  Negative for chest pain, palpitations and leg swelling.  Gastrointestinal:  Negative for abdominal pain, constipation, diarrhea, heartburn, nausea and vomiting.  Genitourinary:   Negative for dysuria, frequency and urgency.  Musculoskeletal:  Positive for joint pain. Negative for falls and neck pain.  Skin:  Negative for itching and rash.  Neurological:  Negative for dizziness, weakness and headaches.  Endo/Heme/Allergies:  Negative for polydipsia. Does not bruise/bleed easily.  Psychiatric/Behavioral:  Negative for depression, substance abuse and suicidal ideas. The patient is not nervous/anxious.      Objective:     BP 130/74 (BP Location: Left Arm, Cuff Size: Small)   Pulse 75   Resp 20   Ht 5\' 6"  (1.676 m)   Wt 123 lb (55.8 kg)   SpO2 100%   BMI 19.85 kg/m    Physical Exam Vitals reviewed.  Constitutional:      General: She is not in acute distress.    Appearance: Normal appearance. She is normal weight. She is not ill-appearing.  HENT:     Head: Normocephalic and atraumatic.     Right Ear: Tympanic membrane, ear canal and external ear normal. There is no impacted cerumen.     Left Ear: Tympanic membrane, ear canal and external ear normal. There is no impacted cerumen.     Nose: Nose normal. No congestion or rhinorrhea.     Mouth/Throat:     Mouth: Mucous membranes are moist.     Pharynx: No oropharyngeal exudate or posterior oropharyngeal erythema.  Eyes:     General: No scleral icterus.       Right eye: No discharge.        Left eye: No discharge.     Extraocular Movements: Extraocular movements intact.     Conjunctiva/sclera: Conjunctivae normal.     Pupils: Pupils are equal, round, and reactive to light.  Neck:     Thyroid : No thyromegaly.     Vascular: No carotid bruit or JVD.     Trachea: Trachea normal.  Cardiovascular:     Rate and Rhythm: Normal rate and regular rhythm.     Pulses: Normal pulses.     Heart sounds: Normal heart sounds. No murmur heard.    No friction rub. No gallop.  Pulmonary:     Effort: Pulmonary effort is normal. No respiratory distress.     Breath sounds: Normal breath sounds. No wheezing.  Abdominal:      General: Bowel sounds are normal. There is no distension.     Palpations: Abdomen is soft.     Tenderness: There is no abdominal tenderness. There is no guarding.  Musculoskeletal:        General: Normal range of motion.     Cervical back: Normal range of motion and neck supple.  Lymphadenopathy:     Cervical: No cervical adenopathy.  Skin:    General: Skin is warm and dry.  Neurological:     Mental Status: She is alert and oriented to person, place, and time.     Cranial Nerves:  No cranial nerve deficit.  Psychiatric:        Mood and Affect: Mood normal.        Behavior: Behavior normal.        Thought Content: Thought content normal.        Judgment: Judgment normal.   No results found for any visits on 05/11/24.     Assessment & Plan:    Routine Health Maintenance and Physical Exam  Immunization History  Administered Date(s) Administered   Fluad Trivalent(High Dose 65+) 08/24/2023   Influenza Inj Mdck Quad Pf 09/02/2015   Influenza,inj,Quad PF,6+ Mos 11/13/2020   Influenza-Unspecified 09/02/2015, 09/13/2018, 09/13/2019, 09/08/2021, 09/17/2022   PFIZER Comirnaty(Gray Top)Covid-19 Tri-Sucrose Vaccine 06/27/2021   PFIZER(Purple Top)SARS-COV-2 Vaccination 12/11/2019, 01/01/2020, 12/12/2020   PNEUMOCOCCAL CONJUGATE-20 09/15/2021   Pfizer Covid-19 Vaccine Bivalent Booster 87yrs & up 10/31/2021, 09/02/2022   Pfizer(Comirnaty)Fall Seasonal Vaccine 12 years and older 08/24/2023   RSV,unspecified 09/17/2022   Tdap 10/31/2015, 03/21/2024    Health Maintenance  Topic Date Due   Lung Cancer Screening  11/20/2023   MAMMOGRAM  03/01/2024   COVID-19 Vaccine (8 - Pfizer risk 2024-25 season) 05/27/2024 (Originally 02/21/2024)   INFLUENZA VACCINE  07/14/2024   Medicare Annual Wellness (AWV)  08/23/2024   Colonoscopy  05/08/2027   DTaP/Tdap/Td (3 - Td or Tdap) 03/21/2034   Pneumonia Vaccine 6+ Years old  Completed   DEXA SCAN  Completed   Hepatitis C Screening  Completed   HPV  VACCINES  Aged Out   Meningococcal B Vaccine  Aged Out   Zoster Vaccines- Shingrix  Discontinued    Discussed health benefits of physical activity, and encouraged her to engage in regular exercise appropriate for her age and condition.  1. Annual physical exam (Primary) Deferring basic labs today as she is followed closely with oncology and frequent lab draws.  Up-to-date on vision and dental care.  Wellness information provided with AVS.  2. Encounter for screening mammogram for malignant neoplasm of breast Mammogram ordered. - MM DIGITAL SCREENING BILATERAL; Future  3. Cervical cancer screening Referring to OB/GYN per patient request. - Ambulatory referral to Obstetrics / Gynecology  4. Essential hypertension with goal blood pressure less than 130/80 Blood pressure at goal today.  Continue Nebivolol  5-10 mg daily as needed. - nebivolol  (BYSTOLIC ) 10 MG tablet; Take 1 tablet (10 mg total) by mouth daily.  Dispense: 90 tablet; Refill: 1  5. Dyslipidemia, goal LDL below 100 Checking lipids.  Continue simvastatin . - Lipid panel - simvastatin  (ZOCOR ) 20 MG tablet; Take 1 tablet (20 mg total) by mouth daily. Needs lab work.  Dispense: 90 tablet; Refill: 3  6. Wheezing Refilling albuterol  inhaler. - albuterol  (PROAIR  HFA) 108 (90 Base) MCG/ACT inhaler; INHALE 1 TO 2 PUFFS BY MOUTH INTO THE LUNGS EVERY 4 HOURS AS NEEDED FOR WHEEZING OR SHORTNESS OF BREATH  Dispense: 18 g; Refill: 1  7. Panic attack 8. Anxiety about health 9. Severe episode of recurrent major depressive disorder, without psychotic features (HCC) Although her PHQ-9 and GAD-7 scores are a bit elevated, she feels like the medications are working well and does not desire a current dose or regimen change.  Denies SI/HI.  Continue Zoloft  and Wellbutrin  as prescribed.  Continue clonazepam  but use sparingly. - buPROPion  (WELLBUTRIN  XL) 300 MG 24 hr tablet; Take 1 tablet (300 mg total) by mouth daily.  Dispense: 90 tablet; Refill:  1 - clonazePAM  (KLONOPIN ) 1 MG tablet; May take 0.5 tablets (0.5 mg total) by mouth daily as needed. May also  take 1 tablet (1 mg total) at bedtime as needed.  Dispense: 135 tablet; Refill: 1 - sertraline  (ZOLOFT ) 50 MG tablet; Take 1 tablet (50 mg total) by mouth daily.  Dispense: 90 tablet; Refill: 0  10. Chronic pain syndrome Continue gabapentin  as prescribed. - gabapentin  (NEURONTIN ) 300 MG capsule; TAKE 1 CAPSULE DAILY AND 4 CAPSULES AT BEDTIME AS DIRECTED.  Dispense: 450 capsule; Refill: 1  Return in about 6 months (around 11/11/2024) for chronic disease follow up.   Braylon Lemmons, NP

## 2024-05-11 NOTE — Patient Instructions (Signed)
 Preventive Care 43 Years and Older, Female Preventive care refers to lifestyle choices and visits with your health care provider that can promote health and wellness. Preventive care visits are also called wellness exams. What can I expect for my preventive care visit? Counseling Your health care provider may ask you questions about your: Medical history, including: Past medical problems. Family medical history. Pregnancy and menstrual history. History of falls. Current health, including: Memory and ability to understand (cognition). Emotional well-being. Home life and relationship well-being. Sexual activity and sexual health. Lifestyle, including: Alcohol, nicotine or tobacco, and drug use. Access to firearms. Diet, exercise, and sleep habits. Work and work Astronomer. Sunscreen use. Safety issues such as seatbelt and bike helmet use. Physical exam Your health care provider will check your: Height and weight. These may be used to calculate your BMI (body mass index). BMI is a measurement that tells if you are at a healthy weight. Waist circumference. This measures the distance around your waistline. This measurement also tells if you are at a healthy weight and may help predict your risk of certain diseases, such as type 2 diabetes and high blood pressure. Heart rate and blood pressure. Body temperature. Skin for abnormal spots. What immunizations do I need?  Vaccines are usually given at various ages, according to a schedule. Your health care provider will recommend vaccines for you based on your age, medical history, and lifestyle or other factors, such as travel or where you work. What tests do I need? Screening Your health care provider may recommend screening tests for certain conditions. This may include: Lipid and cholesterol levels. Hepatitis C test. Hepatitis B test. HIV (human immunodeficiency virus) test. STI (sexually transmitted infection) testing, if you are at  risk. Lung cancer screening. Colorectal cancer screening. Diabetes screening. This is done by checking your blood sugar (glucose) after you have not eaten for a while (fasting). Mammogram. Talk with your health care provider about how often you should have regular mammograms. BRCA-related cancer screening. This may be done if you have a family history of breast, ovarian, tubal, or peritoneal cancers. Bone density scan. This is done to screen for osteoporosis. Talk with your health care provider about your test results, treatment options, and if necessary, the need for more tests. Follow these instructions at home: Eating and drinking  Eat a diet that includes fresh fruits and vegetables, whole grains, lean protein, and low-fat dairy products. Limit your intake of foods with high amounts of sugar, saturated fats, and salt. Take vitamin and mineral supplements as recommended by your health care provider. Do not drink alcohol if your health care provider tells you not to drink. If you drink alcohol: Limit how much you have to 0-1 drink a day. Know how much alcohol is in your drink. In the [place] one drink equals one 12 oz bottle of beer (355 mL), one 5 oz glass of wine (148 mL), or one 1 oz glass of hard liquor (44 mL). Lifestyle Brush your teeth every morning and night with fluoride toothpaste. Floss one time each day. Exercise for at least 30 minutes 5 or more days each week. Do not use any products that contain nicotine or tobacco. These products include cigarettes, chewing tobacco, and vaping devices, such as e-cigarettes. If you need help quitting, ask your health care provider. Do not use drugs. If you are sexually active, practice safe sex. Use a condom or other form of protection in order to prevent STIs. Take aspirin only as told by  your health care provider. Make sure that you understand how much to take and what form to take. Work with your health care provider to find out whether it  is safe and beneficial for you to take aspirin daily. Ask your health care provider if you need to take a cholesterol-lowering medicine (statin). Find healthy ways to manage stress, such as: Meditation, yoga, or listening to music. Journaling. Talking to a trusted person. Spending time with friends and family. Minimize exposure to UV radiation to reduce your risk of skin cancer. Safety Always wear your seat belt while driving or riding in a vehicle. Do not drive: If you have been drinking alcohol. Do not ride with someone who has been drinking. When you are tired or distracted. While texting. If you have been using any mind-altering substances or drugs. Wear a helmet and other protective equipment during sports activities. If you have firearms in your house, make sure you follow all gun safety procedures. What's next? Visit your health care provider once a year for an annual wellness visit. Ask your health care provider how often you should have your eyes and teeth checked. Stay up to date on all vaccines. This information is not intended to replace advice given to you by your health care provider. Make sure you discuss any questions you have with your health care provider. Document Revised: 05/28/2021 Document Reviewed: 05/28/2021 Elsevier Patient Education  2024 [employer].

## 2024-05-24 DIAGNOSIS — Z5111 Encounter for antineoplastic chemotherapy: Secondary | ICD-10-CM | POA: Diagnosis not present

## 2024-05-24 DIAGNOSIS — Z5112 Encounter for antineoplastic immunotherapy: Secondary | ICD-10-CM | POA: Diagnosis not present

## 2024-05-24 DIAGNOSIS — C679 Malignant neoplasm of bladder, unspecified: Secondary | ICD-10-CM | POA: Diagnosis not present

## 2024-05-24 DIAGNOSIS — Z13220 Encounter for screening for lipoid disorders: Secondary | ICD-10-CM | POA: Diagnosis not present

## 2024-05-24 DIAGNOSIS — C675 Malignant neoplasm of bladder neck: Secondary | ICD-10-CM | POA: Diagnosis not present

## 2024-05-31 DIAGNOSIS — Z5112 Encounter for antineoplastic immunotherapy: Secondary | ICD-10-CM | POA: Diagnosis not present

## 2024-05-31 DIAGNOSIS — Z13220 Encounter for screening for lipoid disorders: Secondary | ICD-10-CM | POA: Diagnosis not present

## 2024-05-31 DIAGNOSIS — Z5111 Encounter for antineoplastic chemotherapy: Secondary | ICD-10-CM | POA: Diagnosis not present

## 2024-05-31 DIAGNOSIS — C675 Malignant neoplasm of bladder neck: Secondary | ICD-10-CM | POA: Diagnosis not present

## 2024-05-31 DIAGNOSIS — C679 Malignant neoplasm of bladder, unspecified: Secondary | ICD-10-CM | POA: Diagnosis not present

## 2024-06-10 ENCOUNTER — Other Ambulatory Visit: Payer: Self-pay | Admitting: Sports Medicine

## 2024-06-10 DIAGNOSIS — M19032 Primary osteoarthritis, left wrist: Secondary | ICD-10-CM

## 2024-06-10 DIAGNOSIS — G894 Chronic pain syndrome: Secondary | ICD-10-CM

## 2024-06-10 DIAGNOSIS — M79675 Pain in left toe(s): Secondary | ICD-10-CM

## 2024-06-14 DIAGNOSIS — Z79899 Other long term (current) drug therapy: Secondary | ICD-10-CM | POA: Diagnosis not present

## 2024-06-14 DIAGNOSIS — C679 Malignant neoplasm of bladder, unspecified: Secondary | ICD-10-CM | POA: Diagnosis not present

## 2024-06-14 DIAGNOSIS — Z5112 Encounter for antineoplastic immunotherapy: Secondary | ICD-10-CM | POA: Diagnosis not present

## 2024-06-14 DIAGNOSIS — C678 Malignant neoplasm of overlapping sites of bladder: Secondary | ICD-10-CM | POA: Diagnosis not present

## 2024-06-14 DIAGNOSIS — C675 Malignant neoplasm of bladder neck: Secondary | ICD-10-CM | POA: Diagnosis not present

## 2024-06-21 DIAGNOSIS — C679 Malignant neoplasm of bladder, unspecified: Secondary | ICD-10-CM | POA: Diagnosis not present

## 2024-06-21 DIAGNOSIS — I340 Nonrheumatic mitral (valve) insufficiency: Secondary | ICD-10-CM | POA: Diagnosis not present

## 2024-06-21 DIAGNOSIS — Z79899 Other long term (current) drug therapy: Secondary | ICD-10-CM | POA: Diagnosis not present

## 2024-06-28 DIAGNOSIS — R590 Localized enlarged lymph nodes: Secondary | ICD-10-CM | POA: Diagnosis not present

## 2024-06-28 DIAGNOSIS — Z906 Acquired absence of other parts of urinary tract: Secondary | ICD-10-CM | POA: Diagnosis not present

## 2024-06-28 DIAGNOSIS — N1330 Unspecified hydronephrosis: Secondary | ICD-10-CM | POA: Diagnosis not present

## 2024-06-28 DIAGNOSIS — C679 Malignant neoplasm of bladder, unspecified: Secondary | ICD-10-CM | POA: Diagnosis not present

## 2024-06-28 DIAGNOSIS — R911 Solitary pulmonary nodule: Secondary | ICD-10-CM | POA: Diagnosis not present

## 2024-06-29 DIAGNOSIS — Z124 Encounter for screening for malignant neoplasm of cervix: Secondary | ICD-10-CM | POA: Diagnosis not present

## 2024-06-29 DIAGNOSIS — Z1330 Encounter for screening examination for mental health and behavioral disorders, unspecified: Secondary | ICD-10-CM | POA: Diagnosis not present

## 2024-07-03 DIAGNOSIS — Z01818 Encounter for other preprocedural examination: Secondary | ICD-10-CM | POA: Diagnosis not present

## 2024-07-05 DIAGNOSIS — C675 Malignant neoplasm of bladder neck: Secondary | ICD-10-CM | POA: Diagnosis not present

## 2024-07-05 DIAGNOSIS — Z79899 Other long term (current) drug therapy: Secondary | ICD-10-CM | POA: Diagnosis not present

## 2024-07-05 DIAGNOSIS — Z5112 Encounter for antineoplastic immunotherapy: Secondary | ICD-10-CM | POA: Diagnosis not present

## 2024-07-05 DIAGNOSIS — C678 Malignant neoplasm of overlapping sites of bladder: Secondary | ICD-10-CM | POA: Diagnosis not present

## 2024-07-05 DIAGNOSIS — C679 Malignant neoplasm of bladder, unspecified: Secondary | ICD-10-CM | POA: Diagnosis not present

## 2024-07-11 ENCOUNTER — Encounter: Payer: Self-pay | Admitting: Medical-Surgical

## 2024-07-11 ENCOUNTER — Ambulatory Visit (INDEPENDENT_AMBULATORY_CARE_PROVIDER_SITE_OTHER): Admitting: Medical-Surgical

## 2024-07-11 VITALS — BP 169/85 | HR 65 | Ht 66.0 in | Wt 121.0 lb

## 2024-07-11 DIAGNOSIS — R4589 Other symptoms and signs involving emotional state: Secondary | ICD-10-CM

## 2024-07-11 DIAGNOSIS — I10 Essential (primary) hypertension: Secondary | ICD-10-CM | POA: Diagnosis not present

## 2024-07-11 DIAGNOSIS — N183 Chronic kidney disease, stage 3 unspecified: Secondary | ICD-10-CM

## 2024-07-11 DIAGNOSIS — F332 Major depressive disorder, recurrent severe without psychotic features: Secondary | ICD-10-CM

## 2024-07-11 DIAGNOSIS — F1029 Alcohol dependence with unspecified alcohol-induced disorder: Secondary | ICD-10-CM

## 2024-07-11 DIAGNOSIS — Z1231 Encounter for screening mammogram for malignant neoplasm of breast: Secondary | ICD-10-CM

## 2024-07-11 NOTE — Progress Notes (Signed)
 Established patient visit   History of Present Illness   Discussed the use of AI scribe software for clinical note transcription with the patient, who gave verbal consent to proceed.  History of Present Illness  Daily Mary Stark is a 68 year old female with bladder cancer who presents for follow-up after a TEE and updated CT scan.  She experiences dizziness and shortness of breath when looking up or down. TEE results show normal left and right ventricle size and function, mild mitral valve regurgitation, and aortic valve sclerosis without stenosis. CT scan reveals a right lower lobe solid nodule, subpleural reticular opacities, bronchiectasis, architectural distortion, and emphysematous changes in the bilateral upper lobes. Lymph nodes have increased in size but remain smaller than at the start of PAD CEV for bladder cancer. She continues on Keytruda .  She has low sodium levels, requiring eight to nine sodium pills daily. Sodium level was 124 two months ago and 134 last Wednesday. She reports protein leakage and has chronic kidney disease with regular lab monitoring. She mentions a 'conglomerate' beside her left kidney possibly affecting sodium levels.  She is on clonazepam , Zoloft , gabapentin , simvastatin , Bystolic , Wellbutrin , albuterol , allergy relief, and Abilify . Clonazepam  is taken as needed during the day and at bedtime, and Bystolic  at 5 mg. She has reduced alcohol intake after her last CT scan. Her grandson is staying with her for the summer, and her son recently graduated high school.   Physical Exam   Physical Exam Vitals reviewed.  Constitutional:      General: She is not in acute distress.    Appearance: Normal appearance. She is well-groomed and normal weight.  HENT:     Head: Normocephalic and atraumatic.  Cardiovascular:     Rate and Rhythm: Normal rate and regular rhythm.     Pulses: Normal pulses.     Heart sounds: Normal heart sounds. No murmur heard.    No  friction rub. No gallop.  Pulmonary:     Effort: Pulmonary effort is normal. No respiratory distress.     Breath sounds: Normal breath sounds. No wheezing.  Skin:    General: Skin is warm and dry.     Coloration: Skin is pale.  Neurological:     Mental Status: She is alert and oriented to person, place, and time.  Psychiatric:        Mood and Affect: Mood normal.        Behavior: Behavior normal. Behavior is cooperative.        Thought Content: Thought content normal.        Judgment: Judgment normal.     Assessment & Plan   Assessment & Plan Bladder cancer with lymph node metastases Lymph nodes increased in size but smaller than at treatment start. On Keytruda . - Continue follow up with Oncology. - Monitor with next CT scan.  Pulmonary nodules and cystic/bronchiectatic lung changes Pulmonary nodules and cystic/bronchiectatic lung changes with mild to moderate emphysematous changes in bilateral upper lobes. - Regular follow-up with oncology for monitoring.  Chronic hyponatremia Chronic hyponatremia managed with sodium pills. Recent sodium level at 134. Potential causes include medication effects or kidney issues. - Review medication list for potential causes of hyponatremia. - Continue current sodium supplementation regimen.  Chronic kidney disease Chronic kidney disease with proteinuria and possible sodium loss, managed by renal specialist. - Continue sodium supplementation.  Hypertension Hypertension managed with Bystolic  5 mg. Blood pressure elevated during visit, likely due to physical exertion.  Target systolic blood pressure set by renal doctor around 130. - Recheck of blood pressure showing continued elevated readings.  - Recommend making sure to take today's dose of Bystolic  as soon as possible.  - Monitor BP at home and if remains elevated, return for medication adjustments.   Mitral valve regurgitation, mild Mild mitral valve regurgitation noted on TEE. -  Regular monitoring advised.  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 - Regular monitoring advised.  Anxiety disorder Anxiety disorder managed with clonazepam , Zoloft , and Wellbutrin . Anxiety levels improving. - Continue current medication regimen.  Follow up   Return in about 6 months (around 01/11/2025) for chronic disease follow up.  __________________________________ Zada FREDRIK Palin, DNP, APRN, FNP-BC Primary Care and Sports Medicine [facility]

## 2024-07-12 DIAGNOSIS — C678 Malignant neoplasm of overlapping sites of bladder: Secondary | ICD-10-CM | POA: Diagnosis not present

## 2024-07-12 DIAGNOSIS — C679 Malignant neoplasm of bladder, unspecified: Secondary | ICD-10-CM | POA: Diagnosis not present

## 2024-07-12 DIAGNOSIS — Z79899 Other long term (current) drug therapy: Secondary | ICD-10-CM | POA: Diagnosis not present

## 2024-07-12 DIAGNOSIS — Z5112 Encounter for antineoplastic immunotherapy: Secondary | ICD-10-CM | POA: Diagnosis not present

## 2024-07-12 DIAGNOSIS — C675 Malignant neoplasm of bladder neck: Secondary | ICD-10-CM | POA: Diagnosis not present

## 2024-07-18 DIAGNOSIS — I129 Hypertensive chronic kidney disease with stage 1 through stage 4 chronic kidney disease, or unspecified chronic kidney disease: Secondary | ICD-10-CM | POA: Diagnosis not present

## 2024-07-18 DIAGNOSIS — Z79899 Other long term (current) drug therapy: Secondary | ICD-10-CM | POA: Diagnosis not present

## 2024-07-18 DIAGNOSIS — N135 Crossing vessel and stricture of ureter without hydronephrosis: Secondary | ICD-10-CM | POA: Diagnosis not present

## 2024-07-18 DIAGNOSIS — Z9221 Personal history of antineoplastic chemotherapy: Secondary | ICD-10-CM | POA: Diagnosis not present

## 2024-07-18 DIAGNOSIS — C679 Malignant neoplasm of bladder, unspecified: Secondary | ICD-10-CM | POA: Diagnosis not present

## 2024-07-18 DIAGNOSIS — N183 Chronic kidney disease, stage 3 unspecified: Secondary | ICD-10-CM | POA: Diagnosis not present

## 2024-07-26 DIAGNOSIS — C678 Malignant neoplasm of overlapping sites of bladder: Secondary | ICD-10-CM | POA: Diagnosis not present

## 2024-07-26 DIAGNOSIS — Z79899 Other long term (current) drug therapy: Secondary | ICD-10-CM | POA: Diagnosis not present

## 2024-07-26 DIAGNOSIS — C679 Malignant neoplasm of bladder, unspecified: Secondary | ICD-10-CM | POA: Diagnosis not present

## 2024-07-26 DIAGNOSIS — Z5111 Encounter for antineoplastic chemotherapy: Secondary | ICD-10-CM | POA: Diagnosis not present

## 2024-07-26 DIAGNOSIS — Z5112 Encounter for antineoplastic immunotherapy: Secondary | ICD-10-CM | POA: Diagnosis not present

## 2024-08-02 DIAGNOSIS — Z5112 Encounter for antineoplastic immunotherapy: Secondary | ICD-10-CM | POA: Diagnosis not present

## 2024-08-02 DIAGNOSIS — Z5111 Encounter for antineoplastic chemotherapy: Secondary | ICD-10-CM | POA: Diagnosis not present

## 2024-08-02 DIAGNOSIS — C679 Malignant neoplasm of bladder, unspecified: Secondary | ICD-10-CM | POA: Diagnosis not present

## 2024-08-02 DIAGNOSIS — C678 Malignant neoplasm of overlapping sites of bladder: Secondary | ICD-10-CM | POA: Diagnosis not present

## 2024-08-02 DIAGNOSIS — Z79899 Other long term (current) drug therapy: Secondary | ICD-10-CM | POA: Diagnosis not present

## 2024-08-15 ENCOUNTER — Encounter: Payer: Self-pay | Admitting: Sports Medicine

## 2024-08-16 DIAGNOSIS — C678 Malignant neoplasm of overlapping sites of bladder: Secondary | ICD-10-CM | POA: Diagnosis not present

## 2024-08-16 DIAGNOSIS — Z23 Encounter for immunization: Secondary | ICD-10-CM | POA: Diagnosis not present

## 2024-08-16 DIAGNOSIS — Z79899 Other long term (current) drug therapy: Secondary | ICD-10-CM | POA: Diagnosis not present

## 2024-08-16 DIAGNOSIS — Z5112 Encounter for antineoplastic immunotherapy: Secondary | ICD-10-CM | POA: Diagnosis not present

## 2024-08-16 DIAGNOSIS — C679 Malignant neoplasm of bladder, unspecified: Secondary | ICD-10-CM | POA: Diagnosis not present

## 2024-08-22 DIAGNOSIS — U071 COVID-19: Secondary | ICD-10-CM | POA: Diagnosis not present

## 2024-09-05 DIAGNOSIS — Z9049 Acquired absence of other specified parts of digestive tract: Secondary | ICD-10-CM | POA: Diagnosis not present

## 2024-09-05 DIAGNOSIS — Z960 Presence of urogenital implants: Secondary | ICD-10-CM | POA: Diagnosis not present

## 2024-09-05 DIAGNOSIS — R918 Other nonspecific abnormal finding of lung field: Secondary | ICD-10-CM | POA: Diagnosis not present

## 2024-09-05 DIAGNOSIS — R590 Localized enlarged lymph nodes: Secondary | ICD-10-CM | POA: Diagnosis not present

## 2024-09-05 DIAGNOSIS — C679 Malignant neoplasm of bladder, unspecified: Secondary | ICD-10-CM | POA: Diagnosis not present

## 2024-09-05 DIAGNOSIS — N1330 Unspecified hydronephrosis: Secondary | ICD-10-CM | POA: Diagnosis not present

## 2024-09-05 DIAGNOSIS — C678 Malignant neoplasm of overlapping sites of bladder: Secondary | ICD-10-CM | POA: Diagnosis not present

## 2024-09-06 DIAGNOSIS — C678 Malignant neoplasm of overlapping sites of bladder: Secondary | ICD-10-CM | POA: Diagnosis not present

## 2024-09-06 DIAGNOSIS — Z5112 Encounter for antineoplastic immunotherapy: Secondary | ICD-10-CM | POA: Diagnosis not present

## 2024-09-06 DIAGNOSIS — Z23 Encounter for immunization: Secondary | ICD-10-CM | POA: Diagnosis not present

## 2024-09-06 DIAGNOSIS — Z79899 Other long term (current) drug therapy: Secondary | ICD-10-CM | POA: Diagnosis not present

## 2024-09-06 DIAGNOSIS — C679 Malignant neoplasm of bladder, unspecified: Secondary | ICD-10-CM | POA: Diagnosis not present

## 2024-09-18 ENCOUNTER — Other Ambulatory Visit: Payer: Self-pay | Admitting: Medical-Surgical

## 2024-09-18 DIAGNOSIS — F332 Major depressive disorder, recurrent severe without psychotic features: Secondary | ICD-10-CM

## 2024-09-18 DIAGNOSIS — R4589 Other symptoms and signs involving emotional state: Secondary | ICD-10-CM

## 2024-09-26 DIAGNOSIS — F325 Major depressive disorder, single episode, in full remission: Secondary | ICD-10-CM | POA: Diagnosis not present

## 2024-09-26 DIAGNOSIS — I10 Essential (primary) hypertension: Secondary | ICD-10-CM | POA: Diagnosis not present

## 2024-09-26 DIAGNOSIS — Z880 Allergy status to penicillin: Secondary | ICD-10-CM | POA: Diagnosis not present

## 2024-09-26 DIAGNOSIS — K5900 Constipation, unspecified: Secondary | ICD-10-CM | POA: Diagnosis not present

## 2024-09-26 DIAGNOSIS — J449 Chronic obstructive pulmonary disease, unspecified: Secondary | ICD-10-CM | POA: Diagnosis not present

## 2024-09-26 DIAGNOSIS — C770 Secondary and unspecified malignant neoplasm of lymph nodes of head, face and neck: Secondary | ICD-10-CM | POA: Diagnosis not present

## 2024-09-26 DIAGNOSIS — Z8616 Personal history of COVID-19: Secondary | ICD-10-CM | POA: Diagnosis not present

## 2024-09-26 DIAGNOSIS — R32 Unspecified urinary incontinence: Secondary | ICD-10-CM | POA: Diagnosis not present

## 2024-09-26 DIAGNOSIS — M2040 Other hammer toe(s) (acquired), unspecified foot: Secondary | ICD-10-CM | POA: Diagnosis not present

## 2024-09-26 DIAGNOSIS — H3530 Unspecified macular degeneration: Secondary | ICD-10-CM | POA: Diagnosis not present

## 2024-09-26 DIAGNOSIS — F1021 Alcohol dependence, in remission: Secondary | ICD-10-CM | POA: Diagnosis not present

## 2024-09-26 DIAGNOSIS — I349 Nonrheumatic mitral valve disorder, unspecified: Secondary | ICD-10-CM | POA: Diagnosis not present

## 2024-09-26 DIAGNOSIS — H04129 Dry eye syndrome of unspecified lacrimal gland: Secondary | ICD-10-CM | POA: Diagnosis not present

## 2024-09-26 DIAGNOSIS — E785 Hyperlipidemia, unspecified: Secondary | ICD-10-CM | POA: Diagnosis not present

## 2024-09-26 DIAGNOSIS — D84821 Immunodeficiency due to drugs: Secondary | ICD-10-CM | POA: Diagnosis not present

## 2024-09-26 DIAGNOSIS — C679 Malignant neoplasm of bladder, unspecified: Secondary | ICD-10-CM | POA: Diagnosis not present

## 2024-09-27 DIAGNOSIS — Z79899 Other long term (current) drug therapy: Secondary | ICD-10-CM | POA: Diagnosis not present

## 2024-09-27 DIAGNOSIS — C679 Malignant neoplasm of bladder, unspecified: Secondary | ICD-10-CM | POA: Diagnosis not present

## 2024-09-27 DIAGNOSIS — Z5112 Encounter for antineoplastic immunotherapy: Secondary | ICD-10-CM | POA: Diagnosis not present

## 2024-09-27 DIAGNOSIS — C675 Malignant neoplasm of bladder neck: Secondary | ICD-10-CM | POA: Diagnosis not present

## 2024-10-03 ENCOUNTER — Ambulatory Visit: Payer: Self-pay

## 2024-10-03 ENCOUNTER — Encounter: Payer: Self-pay | Admitting: Physician Assistant

## 2024-10-03 ENCOUNTER — Ambulatory Visit (INDEPENDENT_AMBULATORY_CARE_PROVIDER_SITE_OTHER): Admitting: Physician Assistant

## 2024-10-03 VITALS — BP 131/75 | HR 75 | Ht 66.0 in | Wt 133.0 lb

## 2024-10-03 DIAGNOSIS — N182 Chronic kidney disease, stage 2 (mild): Secondary | ICD-10-CM

## 2024-10-03 DIAGNOSIS — M25552 Pain in left hip: Secondary | ICD-10-CM

## 2024-10-03 DIAGNOSIS — M7062 Trochanteric bursitis, left hip: Secondary | ICD-10-CM | POA: Diagnosis not present

## 2024-10-03 NOTE — Patient Instructions (Signed)
 Hip Bursitis Rehab Ask your health care provider which exercises are safe for you. Do exercises exactly as told by your health care provider and adjust them as directed. It is normal to feel mild stretching, pulling, tightness, or discomfort as you do these exercises. Stop right away if you feel sudden pain or your pain gets worse. Do not begin these exercises until told by your health care provider. Stretching exercise This exercise warms up your muscles and joints and improves the movement and flexibility of your hip. This exercise also helps to relieve pain and stiffness. Iliotibial band stretch An iliotibial band is a strong band of muscle tissue that runs from the outer side of your hip to the outer side of your thigh and knee. Lie on your side with your left / right leg in the top position. Bend your left / right knee and grab your ankle. Stretch out your bottom arm to help you balance. Slowly bring your knee back so your thigh is slightly behind your body. Slowly lower your knee toward the floor until you feel a gentle stretch on the outside of your left / right thigh. If you do not feel a stretch and your knee will not lower more toward the floor, place the heel of your other foot on top of your knee and pull your knee down toward the floor with your foot. Hold this position for __________ seconds. Slowly return to the starting position. Repeat __________ times. Complete this exercise __________ times a day. Strengthening exercises These exercises build strength and endurance in your hip and pelvis. Endurance is the ability to use your muscles for a long time, even after they get tired. Bridge This exercise strengthens the muscles that move your thigh backward (hip extensors). Lie on your back on a firm surface with your knees bent and your feet flat on the floor. Tighten your buttocks muscles and lift your buttocks off the floor until your trunk is level with your thighs. Do not arch your  back. You should feel the muscles working in your buttocks and the back of your thighs. If you do not feel these muscles, slide your feet 1-2 inches (2.5-5 cm) farther away from your buttocks. If this exercise is too easy, try doing it with your arms crossed over your chest. Hold this position for __________ seconds. Slowly lower your hips to the starting position. Let your muscles relax completely after each repetition. Repeat __________ times. Complete this exercise __________ times a day. Squats This exercise strengthens the muscles in front of your thigh and knee (quadriceps). Stand in front of a table, with your feet and knees pointing straight ahead. You may rest your hands on the table for balance but not for support. Slowly bend your knees and lower your hips like you are going to sit in a chair. Keep your weight over your heels, not over your toes. Keep your lower legs upright so they are parallel with the table legs. Do not let your hips go lower than your knees. Do not bend lower than told by your health care provider. If your hip pain increases, do not bend as low. Hold the squat position for __________ seconds. Slowly push with your legs to return to standing. Do not use your hands to pull yourself to standing. Repeat __________ times. Complete this exercise __________ times a day. Hip hike  Stand sideways on a bottom step. Stand on your left / right leg with your other foot unsupported next to  the step. You can hold on to the railing or wall for balance if needed. Keep your knees straight and your torso square. Then lift your left / right hip up toward the ceiling. Hold this position for __________ seconds. Slowly let your left / right hip lower toward the floor, past the starting position. Your foot should get closer to the floor. Do not lean or bend your knees. Repeat __________ times. Complete this exercise __________ times a day. Single leg stand This exercise increases  your balance. Without shoes, stand near a railing or in a doorway. You may hold on to the railing or door frame as needed for balance. Squeeze your left / right buttock muscles, then lift up your other foot. Do not let your left / right hip push out to the side. It is helpful to stand in front of a mirror for this exercise so you can watch your hip. Hold this position for __________ seconds. Repeat __________ times. Complete this exercise __________ times a day. This information is not intended to replace advice given to you by your health care provider. Make sure you discuss any questions you have with your health care provider. Document Revised: 11/12/2021 Document Reviewed: 11/12/2021 Elsevier Patient Education  2024 [employer].

## 2024-10-03 NOTE — Telephone Encounter (Signed)
 FYI Only or Action Required?: FYI only for provider.  Patient was last seen in primary care on 07/11/2024 by Willo Mini, NP.  Called Nurse Triage reporting Hip Pain.  Symptoms began a week ago.  Interventions attempted: Prescription medications: Pain medication. Lidocaine  patch and heating pad.  Symptoms are: gradually worsening.  Triage Disposition: See PCP When Office is Open (Within 3 Days)  Patient/caregiver understands and will follow disposition?: Yes - appt for today in office.                  Copied from CRM #[id number]. Topic: Clinical - Red Word Triage >> Oct 03, 2024  9:10 AM Antwanette L wrote: Red Word that prompted transfer to Nurse Triage: The patient reports experiencing hip pain that radiates down the leg and into the back. The pain began approximately one week ago and has progressively worsened, now interfering with sleep and waking the patient during the night. Reason for Disposition  [1] MODERATE pain (e.g., interferes with normal activities, limping) AND [2] present > 3 days  Answer Assessment - Initial Assessment Questions 1. LOCATION and RADIATION: Where is the pain located? Does the pain spread (shoot) anywhere else?     Left hip  and goes down front of leg 2. QUALITY: What does the pain feel like?  (e.g., sharp, dull, aching, burning)     aching 3. SEVERITY: How bad is the pain? What does it keep you from doing?   (Scale 1-10; or mild, moderate, severe)     5/10 4. ONSET: When did the pain start? Does it come and go, or is it there all the time?     1 week 5. WORK OR EXERCISE: Has there been any recent work or exercise that involved this part of the body?      no 6. CAUSE: What do you think is causing the hip pain?      no 7. AGGRAVATING FACTORS: What makes the hip pain worse? (e.g., walking, climbing stairs, running)     All of the above 8. OTHER SYMPTOMS: Do you have any other symptoms? (e.g., back pain, pain shooting  down leg,  fever, rash)     no  Protocols used: Hip Pain-A-AH

## 2024-10-03 NOTE — Progress Notes (Signed)
 "  Acute Office Visit  Subjective:     Patient ID: Mary Stark, female    DOB: [birth year], 68 y.o.   MRN: [id number]  Chief Complaint  Patient presents with   Hip Pain    HPI Discussed the use of AI scribe software for clinical note transcription with the patient, who gave verbal consent to proceed.  History of Present Illness Mary Stark is a 68 year old female with bladder cancer undergoing chemotherapy who presents with left hip pain.  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      Objective:    BP 131/75   Pulse 75   Ht 5' 6 (1.676 m)   Wt 133 lb (60.3 kg)   SpO2 99%   BMI 21.47 kg/m  BP Readings from Last 3 Encounters:  10/03/24 131/75  07/11/24 (!) 169/85  05/11/24 130/74   Wt Readings from Last 3 Encounters:  10/03/24 133 lb (60.3 kg)  07/11/24 121 lb (54.9 kg)  05/11/24 123 lb (55.8 kg)    Bursa Injection Procedure Note  Pre-operative Diagnosis: Left Greater Trochanter  Bursitis  Post-operative Diagnosis: same  Indications: Diagnosis and treatment of symptomatic bursal effusion  Anesthesia: Ethyl Chloride Spray  Procedure Details   After a discussion of the risks and benefits with the patient (including the possibility that any manipulation of the bursa could introduce infection, worsening the current situation significantly), verbal consent was obtained for the procedure. The joint was prepped with alcohol wipe x 2. An 18 gauge needle with 9mL of 1 percent lidocaine  and 1ML of 40mg  of depo medrol  injected into left hip bursa.The injection site was cleansed with topical isopropyl alcohol and a dressing was applied.  Complications:  None; patient tolerated the procedure well.   Physical Exam HENT:     Head: Normocephalic.  Cardiovascular:     Rate and Rhythm: Normal rate.  Pulmonary:     Effort: Pulmonary effort is normal.  Musculoskeletal:     Right lower leg: No edema.     Left lower leg: No edema.     Comments: NROM at waist No tenderness to palpation over lumbar spine Tenderness to palpation over left greater trochanter 5/5 strength of lower extremities Pain with internal and external ROM at hip   Neurological:     Mental Status: She is alert and oriented to person, place, and time.          Assessment & Plan:  Mary Stark was seen today for  hip pain.  Diagnoses and all orders for this visit:  Greater trochanteric bursitis of left hip  Left hip pain   Assessment & Plan Left hip bursitis Acute left hip pain likely due to bursitis, exacerbated by walking and pressure. Pain management limited by chemotherapy and chronic kidney disease. - Administer bursal injection with steroid in office today. - Continue tramadol  for pain control. - Provide pamphlet on hip care, including icing twice a day. - Advise avoidance of excessive walking and overuse. - Recommend use of a wheelchair for long distances during upcoming beach  trip.  Bladder cancer on chemotherapy Undergoing chemotherapy with rash as a side effect, managed with prednisone  10 mg daily, also relieving knee arthritis.  Chronic kidney disease, STAGE 2 Chronic kidney disease limits NSAID use for pain management. Recent tests allow occasional ibuprofen use. - Avoid daily use of ibuprofen due to impact on kidney function.    Adaria Hole, PA-C   "

## 2024-10-04 ENCOUNTER — Ambulatory Visit (INDEPENDENT_AMBULATORY_CARE_PROVIDER_SITE_OTHER)

## 2024-10-04 ENCOUNTER — Encounter: Payer: Self-pay | Admitting: Physician Assistant

## 2024-10-04 VITALS — Ht 66.0 in | Wt 130.0 lb

## 2024-10-04 DIAGNOSIS — Z0000 Encounter for general adult medical examination without abnormal findings: Secondary | ICD-10-CM | POA: Diagnosis not present

## 2024-10-04 DIAGNOSIS — Z1231 Encounter for screening mammogram for malignant neoplasm of breast: Secondary | ICD-10-CM

## 2024-10-04 DIAGNOSIS — Z79899 Other long term (current) drug therapy: Secondary | ICD-10-CM | POA: Diagnosis not present

## 2024-10-04 DIAGNOSIS — Z780 Asymptomatic menopausal state: Secondary | ICD-10-CM

## 2024-10-04 DIAGNOSIS — Z13820 Encounter for screening for osteoporosis: Secondary | ICD-10-CM

## 2024-10-04 DIAGNOSIS — M25552 Pain in left hip: Secondary | ICD-10-CM | POA: Insufficient documentation

## 2024-10-04 DIAGNOSIS — Z5112 Encounter for antineoplastic immunotherapy: Secondary | ICD-10-CM | POA: Diagnosis not present

## 2024-10-04 DIAGNOSIS — C675 Malignant neoplasm of bladder neck: Secondary | ICD-10-CM | POA: Diagnosis not present

## 2024-10-04 NOTE — Patient Instructions (Signed)
  Mary Stark , Thank you for taking time to come for your Medicare Wellness Visit. I appreciate your ongoing commitment to your health goals. Please review the following plan we discussed and let me know if I can assist you in the future.   These are the goals we discussed:  Goals      Patient Stated     Maintain normal weight over the next year.      Patient Stated     Patient states she would like to maintain her weight.         This is a list of the screening recommended for you and due dates:  Health Maintenance  Topic Date Due   Breast Cancer Screening  03/01/2024   COVID-19 Vaccine (8 - 2025-26 season) 08/14/2024   Medicare Annual Wellness Visit  10/04/2025   Colon Cancer Screening  05/08/2027   DTaP/Tdap/Td vaccine (3 - Td or Tdap) 03/21/2034   Pneumococcal Vaccine for age over 38  Completed   Flu Shot  Completed   DEXA scan (bone density measurement)  Completed   Hepatitis C Screening  Completed   Meningitis B Vaccine  Aged Out   Screening for Lung Cancer  Discontinued   Zoster (Shingles) Vaccine  Discontinued

## 2024-10-04 NOTE — Progress Notes (Signed)
 Subjective:   Mary Stark is a 68 y.o. female who presents for Medicare Annual (Subsequent) preventive examination.  Visit Complete: Virtual I connected with  Mary Stark on 10/04/24 by a audio enabled telemedicine application and verified that I am speaking with the correct person using two identifiers.  Patient Location: Home  Provider Location: Office/Clinic  I discussed the limitations of evaluation and management by telemedicine. The patient expressed understanding and agreed to proceed.  Vital Signs: Because this visit was a virtual/telehealth visit, some criteria may be missing or patient reported. Any vitals not documented were not able to be obtained and vitals that have been documented are patient reported.  Patient Medicare AWV questionnaire was completed by the patient on n/a; I have confirmed that all information answered by patient is correct and no changes since this date.  Cardiac Risk Factors include: advanced age (>39men, >4 women);dyslipidemia;sedentary lifestyle     Objective:    Today's Vitals   10/04/24 0900  Weight: 130 lb (59 kg)  Height: 5' 6 (1.676 m)   Body mass index is 20.98 kg/m.     10/04/2024    9:15 AM 08/24/2023    3:40 PM 07/08/2023    7:47 AM 11/17/2022    8:46 AM 05/25/2022    8:14 AM 05/24/2022    3:32 AM 04/28/2022    8:19 AM  Advanced Directives  Does Patient Have a Medical Advance Directive? Yes Yes Yes Yes Yes Yes Yes  Type of Estate agent of [place];Living will Living will;Healthcare Power of [employer] Power of [place];Living will Healthcare Power of [place];Living will Living will Living will Living will  Does patient want to make changes to medical advance directive? No - Patient declined No - Patient declined No - Patient declined No - Patient declined No - Patient declined No - Patient declined No - Patient declined  Copy of Healthcare Power of Attorney in Chart?  No - copy requested No  - copy requested        Current Medications (verified) Outpatient Encounter Medications as of 10/04/2024  Medication Sig   Acetaminophen  (TYLENOL ) 325 MG CAPS Take by mouth.   albuterol  (PROAIR  HFA) 108 (90 Base) MCG/ACT inhaler INHALE 1 TO 2 PUFFS BY MOUTH INTO THE LUNGS EVERY 4 HOURS AS NEEDED FOR WHEEZING OR SHORTNESS OF BREATH   ARIPiprazole  (ABILIFY ) 10 MG tablet Take 1 tablet (10 mg total) by mouth at bedtime.   buPROPion  (WELLBUTRIN  XL) 300 MG 24 hr tablet Take 1 tablet (300 mg total) by mouth daily.   clonazePAM  (KLONOPIN ) 1 MG tablet May take 0.5 tablets (0.5 mg total) by mouth daily as needed. May also take 1 tablet (1 mg total) at bedtime as needed.   fexofenadine  (FT ALLERGY RELIEF 24 HOUR) 180 MG tablet Take 1 tablet (180 mg total) by mouth daily.   ibuprofen (ADVIL) 200 MG tablet Take 200 mg by mouth every 6 (six) hours as needed.   ipratropium (ATROVENT ) 0.03 % nasal spray Place 2 sprays into both nostrils every 12 (twelve) hours.   lidocaine -prilocaine  (EMLA ) cream Apply 1 application topically as needed.   nebivolol  (BYSTOLIC ) 10 MG tablet Take 1 tablet (10 mg total) by mouth daily. (Patient taking differently: Take 5 mg by mouth daily.)   ondansetron  (ZOFRAN ) 4 MG tablet Take 1 tablet (4 mg total) by mouth every 8 (eight) hours as needed for nausea or vomiting.   phenazopyridine  (PYRIDIUM ) 200 MG tablet Take 200 mg by mouth 3 (  three) times daily.   phentermine 37.5 MG capsule Take 37.5 mg by mouth every morning.   sertraline  (ZOLOFT ) 50 MG tablet TAKE 1 TABLET EVERY DAY   simvastatin  (ZOCOR ) 20 MG tablet Take 1 tablet (20 mg total) by mouth daily. Needs lab work.   tamsulosin  (FLOMAX ) 0.4 MG CAPS capsule Take 1 capsule (0.4 mg total) by mouth at bedtime.   traMADol  (ULTRAM ) 50 MG tablet Take 2 tablets (100 mg total) by mouth every 8 (eight) hours.   gabapentin  (NEURONTIN ) 300 MG capsule TAKE 1 CAPSULE DAILY AND 4 CAPSULES AT BEDTIME AS DIRECTED. (Patient not taking: Reported  on 10/04/2024)   Meth-Hyo-M Bl-Na Phos-Ph Sal (URIBEL ) 118 MG CAPS Take one capsule by mouth 3 times daily (Patient not taking: Reported on 10/04/2024)   nitrofurantoin , macrocrystal-monohydrate, (MACROBID ) 100 MG capsule Take 100 mg by mouth 2 (two) times daily. (Patient not taking: Reported on 10/04/2024)   No facility-administered encounter medications on file as of 10/04/2024.    Allergies (verified) Penicillins and Metformin    History: Past Medical History:  Diagnosis Date   Abnormal CT of the abdomen 10/01/2020   Adenocarcinoma of bladder, stage 4 (HCC) 10/2020   oncologist--- dr amadeo and urologist-- dr pace;  w/ mets , started chemo 12/ 2021 and bilateral ureteral stents to treat hydronephrosis   Alcohol dependence (HCC)    drank heavily years ago doesn't now 10/22/2021   Anemia due to chemotherapy    Bilateral hydronephrosis    urologsit--- dr pace;   chronic issue,  treated with ureteral stents   Cancer (HCC)    CKD (chronic kidney disease), stage III (HCC)    Diverticulosis of colon    Encounter for antineoplastic chemotherapy    Family history of adverse reaction to anesthesia    father had a tight airway   Family history of ovarian cancer 09/27/2013   Mother had ovarian cancer  Pt is BRCA negative.     GAD (generalized anxiety disorder)    GERD (gastroesophageal reflux disease)    H/O: rheumatic fever 06/30/2012   No valvular heart disease     Heavy alcohol consumption 01/06/2018   History of 2019 novel coronavirus disease (COVID-19) 08/13/2020   positive result in epic 08-19-2020,  hospital admission in epic,  covid pneumonia with hypoxia respiratory failure, discharged with oxygen for 6 weeks   History of adenomatous polyp of colon    History of alcohol dependence (HCC) 07/17/2013   History of chemotherapy last done 05-13-2021   History of colitis 03/2018   acute colitis with sepsis   History of colonic polyps 07/18/2013   Repeat colonoscopy 02/2015  IMO  SNOMED Dx Update Oct 2024     History of hepatitis C    completed treatment in 2012   History of kidney stones    History of nonmelanoma skin cancer 03/02/2018   History of rheumatic fever as a child    per pt no valvular issues   History of sepsis    11/ 2021hospital admission in epic due to pyelonephritis due to hydronephrosis, resolved//   urosepsis 2022   History of skin cancer    excision from nose   History of small bowel obstruction 05/25/2022   s/p  right hemicolectomy   Hx of hepatitis C 07/17/2013   Overview:   Completed antiviral therapy 2013, Dr JONETTA Chancy     Hyperlipidemia    Hypertension    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   breakthrough COVID infection likely d/t immune compromise from steroids, pt requiring supplemental O2      Seasonal allergies    Skin cancer    Vitamin D  deficiency    Wears glasses    Past Surgical History:  Procedure Laterality Date   ANAL RECTAL MANOMETRY N/A 01/31/2020   Procedure: ANO RECTAL MANOMETRY;  Surgeon: Burnette Fallow, MD;  Location: [facility];  Service: Endoscopy;  Laterality: N/A;   CESAREAN SECTION  1984   COLONOSCOPY WITH PROPOFOL   05/07/2022   [facility]--- dr outlaw   CYSTOSCOPY W/ URETERAL STENT PLACEMENT Bilateral 01/21/2021   Procedure: CYSTOSCOPY WITH STENT REPLACEMENT;  Surgeon: Elisabeth Valli JONETTA, MD;  Location[facility];  Service: Urology;  Laterality: Bilateral;  1 HR   CYSTOSCOPY W/ URETERAL STENT PLACEMENT Bilateral  05/20/2021   Procedure: CYSTOSCOPY WITH RETROGRADE PYELOGRAM/URETERAL STENT EXCHANGE;  Surgeon: Elisabeth Valli JONETTA, MD;  Location: [facility];  Service: Urology;  Laterality: Bilateral;   CYSTOSCOPY W/ URETERAL STENT PLACEMENT Bilateral 10/28/2021   Procedure: CYSTOSCOPY WITH RETROGRADE PYELOGRAM/URETERAL STENT EXCHANGE;  Surgeon: Elisabeth Valli JONETTA, MD;  Location: [facility];  Service: Urology;  Laterality: Bilateral;   CYSTOSCOPY W/ URETERAL STENT PLACEMENT Bilateral 04/28/2022   Procedure: CYSTOSCOPY SAMUEL STENT EXCHANGE;  Surgeon: Elisabeth Valli JONETTA, MD;  Location[facility];  Service: Urology;  Laterality: Bilateral;  1 HR   CYSTOSCOPY W/ URETERAL STENT PLACEMENT Bilateral 11/17/2022   Procedure: CYSTOSCOPY WITH RETROGRADE PYELOGRAM/URETERAL STENT REPLACEMENT;  Surgeon: Elisabeth Valli JONETTA, MD;  Location: [facility];  Service: Urology;  Laterality: Bilateral;   CYSTOSCOPY WITH RETROGRADE PYELOGRAM, URETEROSCOPY AND STENT PLACEMENT Bilateral 06/11/2020   Procedure: CYSTOSCOPY WITH RETROGRADE PYELOGRAM, URETEROSCOPY AND STENT PLACEMENT, RIGHT URETERAL BIOPSY;  Surgeon: Elisabeth Valli JONETTA, MD;  Location: [facility];  Service: Urology;  Laterality: Bilateral;  90 MINS   CYSTOSCOPY WITH RETROGRADE PYELOGRAM, URETEROSCOPY AND STENT PLACEMENT Bilateral 07/05/2020   Procedure: CYSTOSCOPY WITH RETROGRADE PYELOGRAM, URETEROSCOPY,  BIOPSIES AND STENT EXCHANGES;  Surgeon: Elisabeth Valli JONETTA, MD;  [facility];  Service: Urology;  Laterality: Bilateral;   FINGER SURGERY Right    5th   GYNECOLOGIC CRYOSURGERY  YRS AGO   IR 3D INDEPENDENT WKST  07/08/2023   IR ANGIOGRAM EXTREMITY LEFT  07/08/2023   IR ANGIOGRAM SELECTIVE EACH ADDITIONAL VESSEL  07/08/2023   IR ANGIOGRAM SELECTIVE EACH ADDITIONAL VESSEL  07/08/2023   IR EMBO ARTERIAL NOT HEMORR HEMANG INC GUIDE ROADMAPPING  07/08/2023   IR IMAGING GUIDED PORT INSERTION  11/18/2020    IR RADIOLOGIST EVAL & MGMT  06/09/2023   IR RADIOLOGIST EVAL & MGMT  08/20/2023   IR RADIOLOGIST EVAL & MGMT  12/31/2023   IR RADIOLOGIST EVAL & MGMT  03/06/2024   IR US  GUIDE VASC ACCESS LEFT  07/08/2023   PARTIAL COLECTOMY Right 05/25/2022   Procedure: EXPLORATORY LAPAROTOMY; RIGHT HEMI COLECTOMY; PARTIAL OMENTECTOMY;  Surgeon: Belinda Cough, MD;  Location: [facility];  Service: General;  Laterality: Right;  TONSILLECTOMY     chld   TRANSURETHRAL RESECTION OF BLADDER TUMOR  01/21/2021   Procedure: TRANSURETHRAL RESECTION OF BLADDER TUMOR (TURBT);  Surgeon: Elisabeth Valli BIRCH, MD;  Location: [facility];  Service: Urology;;   Family History  Problem Relation Age of Onset   Ovarian cancer Mother    Early death Mother    Cardiomyopathy Father    Valvular heart disease Father    Alcohol abuse Father    COPD Father    Heart disease Father    Hyperlipidemia Father    Hypertension Father    Liver cancer Brother    Protein C deficiency Brother    Protein C deficiency Brother    Stroke Brother    Social History   Socioeconomic History   Marital status: Married    Spouse name: Not on file   Number of children: Not on file   Years of education: Not on file   Highest education level: Bachelor's degree (e.g., BA, AB, BS)  Occupational History   Not on file  Tobacco Use   Smoking status: Former    Current packs/day: 0.00    Average packs/day: 1 pack/day for 25.0 years (25.0 ttl pk-yrs)    Types: Cigarettes    Start date: 79    Quit date: 2012    Years since quitting: 13.8   Smokeless tobacco: Never  Vaping Use   Vaping status: Every Day   Substances: Nicotine    Devices: unsure name  Substance and Sexual Activity   Alcohol use: Yes    Comment: occasional beer   Drug use: Never   Sexual activity: Not on file  Other Topics Concern   Not on file  Social History Narrative   Mary Stark states her grandson stays with her a lot. She enjoys quilting and plays games.     Social Drivers of Corporate investment banker Strain: Low Risk  (10/04/2024)   Overall Financial Resource Strain (CARDIA)    Difficulty of Paying Living Expenses: Not hard at all  Food Insecurity: No Food Insecurity (10/04/2024)   Hunger Vital Sign    Worried About Running Out of Food in the Last Year: Never true    Ran Out of Food in the Last Year: Never true  Transportation Needs: No Transportation Needs (10/04/2024)   PRAPARE - Administrator, Civil Service (Medical): No    Lack of Transportation (Non-Medical): No  Physical Activity: Inactive (10/04/2024)   Exercise Vital Sign    Days of Exercise per Week: 0 days    Minutes of Exercise per Session: 0 min  Stress: No Stress Concern Present (10/04/2024)   [employer] of Occupational Health - Occupational Stress Questionnaire    Feeling of Stress: Only a little  Social Connections: Moderately Isolated (10/04/2024)   Social Connection and Isolation Panel    Frequency of Communication with Friends and Family: More than three times a week    Frequency of Social Gatherings with Friends and Family: More than three times a week    Attends Religious Services: Never    Database administrator or Organizations: Yes    Attends Engineer, structural: More than 4 times per year    Marital Status: Widowed    Tobacco Counseling Counseling given: Not Answered   Clinical Intake:  Pre-visit preparation completed: Yes  Pain : No/denies pain     BMI - recorded: 20.98 Nutritional Status: BMI of 19-24  Normal Nutritional Risks: None Diabetes: No  How often do you need to have someone help you when you read instructions, pamphlets, or other written materials from your doctor or pharmacy?: 1 - Never What is the last grade level you completed in school?: 16  Interpreter Needed?: No      Activities of Daily Living    10/04/2024    9:01 AM  In your present state of health, do you have any difficulty  performing the following activities:  Hearing? 0  Vision? 0  Difficulty concentrating or making decisions? 0  Walking or climbing stairs? 1  Dressing or bathing? 0  Doing errands, shopping? 0  Preparing Food and eating ? N  Using the Toilet? N  In the past six months, have you accidently leaked urine? N  Do you have problems with loss of bowel control? Y  Managing your Medications? N  Managing your Finances? N  Housekeeping or managing your Housekeeping? N    Patient Care Team: Mary Mini, NP as PCP - General (Nurse Practitioner) Tasia Lung, MD as Consulting Physician (Psychiatry) Joane Artist RAMAN, MD as Consulting Physician (Sports Medicine) Amadeo Windell SAILOR, MD (Inactive) as Consulting Physician (Oncology) Burnette Fallow, MD as Consulting Physician (Gastroenterology)  Indicate any recent Medical Services you may have received from other than [facility] providers in the past year (date may be approximate).     Assessment:   This is a routine wellness examination for Mary Stark.  Hearing/Vision screen No results found.   Goals Addressed             This Visit's Progress    Patient Stated       Patient states she would like to maintain her weight.        Depression Screen    10/04/2024    9:12 AM 07/11/2024    2:31 PM 05/11/2024    3:48 PM 12/28/2023    3:13 PM 07/01/2023    4:03 PM 05/19/2022   11:30 AM 04/17/2022    3:12 PM  PHQ 2/9 Scores  PHQ - 2 Score 1 1 1  4 3 1   PHQ- 9 Score   5  8 7    Exception Documentation    Other- indicate reason in comment box     Not completed    Husband passed 11/24/2023 doesn't feel like she should fill this out at this time       Fall Risk    10/04/2024    9:15 AM 05/11/2024    3:47 PM 05/11/2024    2:59 PM 08/24/2023    3:41 PM 07/01/2023    4:03 PM  Fall Risk   Falls in the past year? 0 0 0 0 0  Number falls in past yr: 0 0 0  0  Injury with Fall? 0 0 0  0  Risk for fall due to : No Fall Risks No Fall Risks No Fall Risks  No  Fall Risks  Follow up Falls evaluation completed Falls evaluation completed Falls evaluation completed Falls evaluation completed Falls evaluation completed    MEDICARE RISK AT HOME: Medicare Risk at Home Any stairs in or around the home?: Yes If so, are there any without handrails?: Yes Home free of loose throw rugs in walkways, pet beds, electrical cords, etc?: Yes Adequate lighting in your home to reduce risk of falls?: Yes Life alert?: No Use of a cane, walker or w/c?: No Grab bars in the bathroom?: No Shower chair or bench in shower?: Yes Elevated toilet seat or a handicapped  toilet?: Yes  TIMED UP AND GO:  Was the test performed?  No    Cognitive Function:        10/04/2024    9:16 AM 08/24/2023    3:43 PM  6CIT Screen  What Year? 0 points 0 points  What month? 0 points 0 points  What time? 0 points 0 points  Count back from 20 0 points 0 points  Months in reverse 2 points 4 points  Repeat phrase 2 points 2 points  Total Score 4 points 6 points    Immunizations Immunization History  Administered Date(s) Administered   Fluad Trivalent(High Dose 65+) 08/24/2023   INFLUENZA, HIGH DOSE SEASONAL PF 09/06/2024   Influenza Inj Mdck Quad Pf 09/02/2015   Influenza,inj,Quad PF,6+ Mos 11/13/2020   Influenza-Unspecified 09/02/2015, 09/13/2018, 09/13/2019, 09/08/2021, 09/17/2022   PFIZER Comirnaty(Gray Top)Covid-19 Tri-Sucrose Vaccine 06/27/2021   PFIZER(Purple Top)SARS-COV-2 Vaccination 12/11/2019, 01/01/2020, 12/12/2020   PNEUMOCOCCAL CONJUGATE-20 09/15/2021   Pfizer Covid-19 Vaccine Bivalent Booster 87yrs & up 10/31/2021, 09/02/2022   Pfizer(Comirnaty)Fall Seasonal Vaccine 12 years and older 08/24/2023   RSV,unspecified 09/17/2022   Tdap 10/31/2015, 03/21/2024    TDAP status: Up to date  Flu Vaccine status: Up to date  Pneumococcal vaccine status: Up to date  Covid-19 vaccine status: Declined, Education has been provided regarding the importance of this vaccine  but patient still declined. Advised may receive this vaccine at local pharmacy or Health Dept.or vaccine clinic. Aware to provide a copy of the vaccination record if obtained from local pharmacy or Health Dept. Verbalized acceptance and understanding.  Qualifies for Shingles Vaccine? Yes   Zostavax completed No   Shingrix Completed?: Yes  Screening Tests Health Maintenance  Topic Date Due   Mammogram  03/01/2024   COVID-19 Vaccine (8 - 2025-26 season) 08/14/2024   Medicare Annual Wellness (AWV)  10/04/2025   Colonoscopy  05/08/2027   DTaP/Tdap/Td (3 - Td or Tdap) 03/21/2034   Pneumococcal Vaccine: 50+ Years  Completed   Influenza Vaccine  Completed   DEXA SCAN  Completed   Hepatitis C Screening  Completed   Meningococcal B Vaccine  Aged Out   Lung Cancer Screening  Discontinued   Zoster Vaccines- Shingrix  Discontinued    Health Maintenance  Health Maintenance Due  Topic Date Due   Mammogram  03/01/2024   COVID-19 Vaccine (8 - 2025-26 season) 08/14/2024    Colorectal cancer screening: Type of screening: Colonoscopy. Completed 05/07/2022. Repeat every 5 years  Mammogram status: Ordered 10/04/2024. Pt provided with contact info and advised to call to schedule appt.   Bone Density status: Completed 10/10/2013. Results reflect: Bone density results: OSTEOPENIA. Repeat every 5 years.  Lung Cancer Screening: (Low Dose CT Chest recommended if Age 64-80 years, 20 pack-year currently smoking OR have quit w/in 15years.) does not qualify.   Lung Cancer Screening Referral: n/a  Additional Screening:  Hepatitis C Screening: does qualify; Completed 05/11/2024  Vision Screening: Recommended annual ophthalmology exams for early detection of glaucoma and other disorders of the eye. Is the patient up to date with their annual eye exam?  Yes  Who is the provider or what is the name of the office in which the patient attends annual eye exams?  If pt is not established with a provider, would  they like to be referred to a provider to establish care? N/a.   Dental Screening: Recommended annual dental exams for proper oral hygiene   Community Resource Referral / Chronic Care Management: CRR required this visit?  No  CCM required this visit?  No     Plan:     I have personally reviewed and noted the following in the patient's chart:   Medical and social history Use of alcohol, tobacco or illicit drugs  Current medications and supplements including opioid prescriptions. Patient is currently taking opioid prescriptions. Information provided to patient regarding non-opioid alternatives. Patient advised to discuss non-opioid treatment plan with their provider. Functional ability and status Nutritional status Physical activity Advanced directives List of other physicians Hospitalizations, surgeries, and ER visits in previous 12 months. None Vitals Screenings to include cognitive, depression, and falls Referrals and appointments  In addition, I have reviewed and discussed with patient certain preventive protocols, quality metrics, and best practice recommendations. A written personalized care plan for preventive services as well as general preventive health recommendations were provided to patient.     Bonny Jon Mayor, CMA   10/04/2024   After Visit Summary: (MyChart) Due to this being a telephonic visit, the after visit summary with patients personalized plan was offered to patient via MyChart   Nurse Notes:   Mary Stark is a 68 y.o. female patient of Mary Palin, NP who had a Medicare Annual Wellness Visit today via telephone. Mary Stark's grandson stays with her sometimes. She reports that she is socially active and does interact with friends/family regularly. She is moderately physically active and enjoys quilting and playing games.

## 2024-10-05 ENCOUNTER — Other Ambulatory Visit: Payer: Self-pay | Admitting: Medical-Surgical

## 2024-10-05 DIAGNOSIS — M7062 Trochanteric bursitis, left hip: Secondary | ICD-10-CM | POA: Diagnosis not present

## 2024-10-05 DIAGNOSIS — F332 Major depressive disorder, recurrent severe without psychotic features: Secondary | ICD-10-CM

## 2024-10-05 MED ORDER — METHYLPREDNISOLONE ACETATE 40 MG/ML IJ SUSP
40.0000 mg | Freq: Once | INTRAMUSCULAR | Status: AC
  Administered 2024-10-05: 40 mg via INTRAMUSCULAR

## 2024-10-05 NOTE — Addendum Note (Signed)
 Addended by: Avondre Richens A on: 10/05/2024 12:51 PM   Modules accepted: Orders

## 2024-10-10 ENCOUNTER — Telehealth (HOSPITAL_BASED_OUTPATIENT_CLINIC_OR_DEPARTMENT_OTHER): Payer: Self-pay

## 2024-10-11 ENCOUNTER — Other Ambulatory Visit: Payer: Self-pay

## 2024-10-11 DIAGNOSIS — I10 Essential (primary) hypertension: Secondary | ICD-10-CM

## 2024-10-11 MED ORDER — NEBIVOLOL HCL 10 MG PO TABS: 10.0000 mg | ORAL_TABLET | Freq: Every day | ORAL | 0 refills | Status: AC

## 2024-10-18 DIAGNOSIS — C679 Malignant neoplasm of bladder, unspecified: Secondary | ICD-10-CM | POA: Diagnosis not present

## 2024-10-19 ENCOUNTER — Ambulatory Visit (HOSPITAL_BASED_OUTPATIENT_CLINIC_OR_DEPARTMENT_OTHER)
Admission: RE | Admit: 2024-10-19 | Discharge: 2024-10-19 | Disposition: A | Discharge status: Home / Self Care | Source: Ambulatory Visit | Attending: Medical-Surgical | Admitting: Medical-Surgical

## 2024-10-19 ENCOUNTER — Encounter (HOSPITAL_BASED_OUTPATIENT_CLINIC_OR_DEPARTMENT_OTHER): Payer: Self-pay

## 2024-10-19 ENCOUNTER — Other Ambulatory Visit: Payer: Self-pay | Admitting: Medical-Surgical

## 2024-10-19 DIAGNOSIS — Z1231 Encounter for screening mammogram for malignant neoplasm of breast: Secondary | ICD-10-CM | POA: Insufficient documentation

## 2024-10-19 DIAGNOSIS — Z0000 Encounter for general adult medical examination without abnormal findings: Secondary | ICD-10-CM

## 2024-10-19 DIAGNOSIS — Z13820 Encounter for screening for osteoporosis: Secondary | ICD-10-CM

## 2024-10-23 ENCOUNTER — Ambulatory Visit: Payer: Self-pay | Admitting: Medical-Surgical

## 2024-10-25 DIAGNOSIS — Z79899 Other long term (current) drug therapy: Secondary | ICD-10-CM | POA: Diagnosis not present

## 2024-10-25 DIAGNOSIS — C679 Malignant neoplasm of bladder, unspecified: Secondary | ICD-10-CM | POA: Diagnosis not present

## 2024-10-25 DIAGNOSIS — Z5111 Encounter for antineoplastic chemotherapy: Secondary | ICD-10-CM | POA: Diagnosis not present

## 2024-10-25 DIAGNOSIS — Z5112 Encounter for antineoplastic immunotherapy: Secondary | ICD-10-CM | POA: Diagnosis not present

## 2024-11-08 DIAGNOSIS — Z79899 Other long term (current) drug therapy: Secondary | ICD-10-CM | POA: Diagnosis not present

## 2024-11-08 DIAGNOSIS — Z5111 Encounter for antineoplastic chemotherapy: Secondary | ICD-10-CM | POA: Diagnosis not present

## 2024-11-08 DIAGNOSIS — Z5112 Encounter for antineoplastic immunotherapy: Secondary | ICD-10-CM | POA: Diagnosis not present

## 2024-11-08 DIAGNOSIS — C679 Malignant neoplasm of bladder, unspecified: Secondary | ICD-10-CM | POA: Diagnosis not present

## 2024-11-13 ENCOUNTER — Ambulatory Visit: Admitting: Medical-Surgical

## 2024-11-14 ENCOUNTER — Ambulatory Visit: Admitting: Medical-Surgical

## 2024-11-14 ENCOUNTER — Encounter: Payer: Self-pay | Admitting: Medical-Surgical

## 2024-11-14 VITALS — BP 108/67 | HR 77 | Resp 20 | Ht 66.0 in | Wt 134.0 lb

## 2024-11-14 DIAGNOSIS — G894 Chronic pain syndrome: Secondary | ICD-10-CM

## 2024-11-14 DIAGNOSIS — I10 Essential (primary) hypertension: Secondary | ICD-10-CM | POA: Diagnosis not present

## 2024-11-14 DIAGNOSIS — F1029 Alcohol dependence with unspecified alcohol-induced disorder: Secondary | ICD-10-CM

## 2024-11-14 DIAGNOSIS — E785 Hyperlipidemia, unspecified: Secondary | ICD-10-CM | POA: Diagnosis not present

## 2024-11-14 DIAGNOSIS — F332 Major depressive disorder, recurrent severe without psychotic features: Secondary | ICD-10-CM | POA: Diagnosis not present

## 2024-11-14 DIAGNOSIS — R4589 Other symptoms and signs involving emotional state: Secondary | ICD-10-CM

## 2024-11-14 DIAGNOSIS — M19032 Primary osteoarthritis, left wrist: Secondary | ICD-10-CM

## 2024-11-14 DIAGNOSIS — N183 Chronic kidney disease, stage 3 unspecified: Secondary | ICD-10-CM | POA: Diagnosis not present

## 2024-11-14 DIAGNOSIS — M79675 Pain in left toe(s): Secondary | ICD-10-CM

## 2024-11-14 DIAGNOSIS — R079 Chest pain, unspecified: Secondary | ICD-10-CM | POA: Diagnosis not present

## 2024-11-14 MED ORDER — NEBIVOLOL HCL 5 MG PO TABS: 5.0000 mg | ORAL_TABLET | Freq: Every day | ORAL | 3 refills | Status: AC

## 2024-11-14 MED ORDER — TRAMADOL HCL 50 MG PO TABS: 100.0000 mg | ORAL_TABLET | Freq: Three times a day (TID) | ORAL | 3 refills | Status: AC

## 2024-11-14 NOTE — Progress Notes (Cosign Needed Addendum)
 Established Patient Office Visit  Subjective   Patient ID: YVONNA BRUN, female    DOB: 04-Jan-1956  Age: 68 y.o. MRN: [id number]  Chief Complaint  Patient presents with   Medical Management of Chronic Issues    HPI  68 year old female presents for 6 month follow up for the following chronic diseases.  Essential Hypertension Patient is currently taking bystolic  5 mg daily. Dosage was decreased by her Nephrologist about a year ago. She does no routinely check her blood pressures at home, but states that she is doing well on current dosage   Dyslipidemia Patient is currently taking simvastatin  20 mg daily. States that she is doing well on her current dosage. Denies having any side effects to her medications.  Stage 3 Kidney Disease Patient is followed closely by Nephrology. Follow up is in April. Previous labs were completed in October.  MDD Currently taking Wellbutrin  300 mg daily. She states that she is doing well on her current dosage. Current PHQ 9 score is a 3  Anxiety Patient is currently taking clonazepam  1 mg daily. Reports that she is doing well on her current dose. GAD 7 score is 6.  Chest Pain/ Pressure Patient with onset of chest pain during visit. Rates pain 3/10. Patient given Tums with relief of symptoms prior to leaving. Normal EKG.  Review of Systems  Constitutional: Negative.   HENT: Negative.    Eyes: Negative.   Respiratory: Negative.    Cardiovascular:  Positive for chest pain.       Chest Pressure  Gastrointestinal: Negative.   Genitourinary: Negative.   Musculoskeletal: Negative.   Skin: Negative.   Neurological: Negative.   Endo/Heme/Allergies: Negative.   Psychiatric/Behavioral: Negative.        Objective:     BP 108/67 (BP Location: Left Arm, Cuff Size: Normal)   Pulse 77   Resp 20   Ht 5' 6 (1.676 m)   Wt 60.8 kg   SpO2 98%   BMI 21.63 kg/m  BP Readings from Last 3 Encounters:  11/14/24 108/67  10/03/24 131/75  07/11/24 (!)  169/85   Wt Readings from Last 3 Encounters:  11/14/24 60.8 kg  10/04/24 59 kg  10/03/24 60.3 kg      Physical Exam Vitals and nursing note reviewed.  Constitutional:      General: She is not in acute distress.    Appearance: Normal appearance.  Cardiovascular:     Rate and Rhythm: Normal rate and regular rhythm.     Pulses: Normal pulses.     Heart sounds: Normal heart sounds.  Pulmonary:     Effort: Pulmonary effort is normal.     Breath sounds: Normal breath sounds.  Neurological:     General: No focal deficit present.     Mental Status: She is alert and oriented to person, place, and time.  Psychiatric:        Mood and Affect: Mood normal.        Behavior: Behavior normal.        Thought Content: Thought content normal.        Judgment: Judgment normal.      No results found for any visits on 11/14/24.  Last metabolic panel Lab Results  Component Value Date   GLUCOSE 80 07/08/2023   NA 130 (L) 07/08/2023   K 4.0 07/08/2023   CL 98 07/08/2023   CO2 22 07/08/2023   BUN 14 07/08/2023   CREATININE 1.10 (H) 07/08/2023   GFRNONAA  56 (L) 07/08/2023   CALCIUM  8.9 07/08/2023   PHOS 3.3 05/26/2022   PROT 7.2 11/25/2022   ALBUMIN 4.2 11/25/2022   LABGLOB 2.6 01/10/2018   AGRATIO 1.8 01/10/2018   BILITOT 0.4 11/25/2022   ALKPHOS 71 11/25/2022   AST 18 11/25/2022   ALT 13 11/25/2022   ANIONGAP 10 07/08/2023   Last lipids Lab Results  Component Value Date   CHOL 179 05/16/2019   HDL 57 05/16/2019   LDLCALC 98 05/16/2019   TRIG 194 (H) 08/19/2020   CHOLHDL 3.1 05/16/2019    The [id number] ASCVD risk score (Arnett DK, et al., 2019) is: 6%    Assessment & Plan:  1. Essential hypertension with goal blood pressure less than 130/80 (Primary) -  Continue with current regimen Bystolic  5 mg daily  2. Dyslipidemia, goal LDL below 100 - Continue taking simvastatin  20 mg daily  3. Stage 3 chronic kidney disease, unspecified whether stage 3a or 3b CKD  (HCC) -Followed closely by Nephrology  4. Severe episode of recurrent major depressive disorder, without psychotic features (HCC) -Continue with current regimen Wellbutrin  300 mg daily  5. Anxiety about health -Continue taking clonazepam  1 mg daily  6. Chronic pain syndrome -Continue taking tramadol  50 mg tablet daily  - traMADol  (ULTRAM ) 50 MG tablet; Take 2 tablets (100 mg total) by mouth every 8 (eight) hours.  Dispense: 180 tablet; Refill: 3  7. Chest pain, unspecified type - Chest pain and pressure during visit, no other symptoms - Rates pain 3/10 - 2 Tums given at onset of pain - Symptoms improved prior to leaving  - Normal EKG - EKG 12-Lead   Return in about 6 months (around 05/15/2025) for Chronic Disease Follow Up.    Derrek JINNY Freund, NP Student

## 2024-11-15 NOTE — Progress Notes (Signed)
 Medical screening examination/treatment was performed by qualified clinical staff member and as supervising provider I was immediately available for consultation/collaboration. I have reviewed documentation and agree with assessment and plan.  Thayer Ohm, DNP, APRN, FNP-BC [facility]

## 2024-11-27 ENCOUNTER — Ambulatory Visit: Payer: Self-pay | Admitting: Medical-Surgical

## 2024-11-27 ENCOUNTER — Ambulatory Visit (HOSPITAL_BASED_OUTPATIENT_CLINIC_OR_DEPARTMENT_OTHER)
Admission: RE | Admit: 2024-11-27 | Discharge: 2024-11-27 | Disposition: A | Source: Ambulatory Visit | Attending: Medical-Surgical | Admitting: Medical-Surgical

## 2024-11-27 DIAGNOSIS — Z13820 Encounter for screening for osteoporosis: Secondary | ICD-10-CM | POA: Insufficient documentation

## 2024-11-27 DIAGNOSIS — Z780 Asymptomatic menopausal state: Secondary | ICD-10-CM | POA: Insufficient documentation

## 2024-11-29 ENCOUNTER — Encounter: Payer: Self-pay | Admitting: Medical-Surgical

## 2024-11-29 ENCOUNTER — Ambulatory Visit

## 2024-11-29 DIAGNOSIS — M25532 Pain in left wrist: Secondary | ICD-10-CM

## 2024-11-29 DIAGNOSIS — F410 Panic disorder [episodic paroxysmal anxiety] without agoraphobia: Secondary | ICD-10-CM

## 2024-11-29 DIAGNOSIS — R4589 Other symptoms and signs involving emotional state: Secondary | ICD-10-CM

## 2024-11-29 MED ORDER — CLONAZEPAM 1 MG PO TABS: ORAL_TABLET | ORAL | 1 refills | Status: AC

## 2024-12-21 ENCOUNTER — Other Ambulatory Visit: Payer: Self-pay | Admitting: Medical-Surgical

## 2024-12-21 DIAGNOSIS — F332 Major depressive disorder, recurrent severe without psychotic features: Secondary | ICD-10-CM

## 2025-10-09 ENCOUNTER — Ambulatory Visit
